# Patient Record
Sex: Female | Born: 1960
Health system: Southern US, Community
[De-identification: ages and names within clinical notes are randomized; demographics above are authoritative.]

## PROBLEM LIST (undated history)

## (undated) DIAGNOSIS — I509 Heart failure, unspecified: Secondary | ICD-10-CM

## (undated) DIAGNOSIS — I10 Essential (primary) hypertension: Secondary | ICD-10-CM

## (undated) DIAGNOSIS — B182 Chronic viral hepatitis C: Secondary | ICD-10-CM

## (undated) DIAGNOSIS — A599 Trichomoniasis, unspecified: Secondary | ICD-10-CM

## (undated) DIAGNOSIS — A4902 Methicillin resistant Staphylococcus aureus infection, unspecified site: Secondary | ICD-10-CM

## (undated) DIAGNOSIS — N760 Acute vaginitis: Secondary | ICD-10-CM

## (undated) DIAGNOSIS — B009 Herpesviral infection, unspecified: Secondary | ICD-10-CM

## (undated) DIAGNOSIS — C169 Malignant neoplasm of stomach, unspecified: Secondary | ICD-10-CM

## (undated) DIAGNOSIS — D649 Anemia, unspecified: Secondary | ICD-10-CM

## (undated) DIAGNOSIS — D219 Benign neoplasm of connective and other soft tissue, unspecified: Secondary | ICD-10-CM

## (undated) HISTORY — DX: Acute vaginitis: N76.0

## (undated) HISTORY — DX: Malignant neoplasm of stomach, unspecified: C16.9

## (undated) HISTORY — DX: Herpesviral infection, unspecified: B00.9

## (undated) HISTORY — DX: Chronic viral hepatitis C: B18.2

## (undated) HISTORY — DX: Heart failure, unspecified: I50.9

---

## 2000-02-10 ENCOUNTER — Emergency Department (HOSPITAL_COMMUNITY): Admission: EM | Admit: 2000-02-10 | Discharge: 2000-02-10 | Payer: Self-pay | Admitting: Emergency Medicine

## 2000-03-19 ENCOUNTER — Emergency Department (HOSPITAL_COMMUNITY): Admission: EM | Admit: 2000-03-19 | Discharge: 2000-03-19 | Payer: Self-pay | Admitting: Emergency Medicine

## 2000-03-20 ENCOUNTER — Emergency Department (HOSPITAL_COMMUNITY): Admission: EM | Admit: 2000-03-20 | Discharge: 2000-03-20 | Payer: Self-pay | Admitting: Emergency Medicine

## 2000-03-24 ENCOUNTER — Emergency Department (HOSPITAL_COMMUNITY): Admission: EM | Admit: 2000-03-24 | Discharge: 2000-03-24 | Payer: Self-pay | Admitting: Emergency Medicine

## 2000-03-24 ENCOUNTER — Encounter: Payer: Self-pay | Admitting: Emergency Medicine

## 2001-02-01 ENCOUNTER — Emergency Department (HOSPITAL_COMMUNITY): Admission: EM | Admit: 2001-02-01 | Discharge: 2001-02-01 | Payer: Self-pay | Admitting: Emergency Medicine

## 2002-01-06 ENCOUNTER — Emergency Department (HOSPITAL_COMMUNITY): Admission: EM | Admit: 2002-01-06 | Discharge: 2002-01-07 | Payer: Self-pay | Admitting: Emergency Medicine

## 2002-01-07 ENCOUNTER — Encounter: Payer: Self-pay | Admitting: Emergency Medicine

## 2002-09-19 ENCOUNTER — Emergency Department (HOSPITAL_COMMUNITY): Admission: EM | Admit: 2002-09-19 | Discharge: 2002-09-19 | Payer: Self-pay | Admitting: Emergency Medicine

## 2005-01-06 ENCOUNTER — Emergency Department (HOSPITAL_COMMUNITY): Admission: EM | Admit: 2005-01-06 | Discharge: 2005-01-06 | Payer: Self-pay | Admitting: Emergency Medicine

## 2005-01-07 ENCOUNTER — Ambulatory Visit: Payer: Self-pay | Admitting: Internal Medicine

## 2005-01-07 ENCOUNTER — Inpatient Hospital Stay (HOSPITAL_COMMUNITY): Admission: AD | Admit: 2005-01-07 | Discharge: 2005-01-08 | Payer: Self-pay | Admitting: Internal Medicine

## 2005-01-07 ENCOUNTER — Ambulatory Visit: Payer: Self-pay | Admitting: *Deleted

## 2005-01-15 ENCOUNTER — Ambulatory Visit: Payer: Self-pay | Admitting: Family Medicine

## 2005-01-22 ENCOUNTER — Ambulatory Visit: Payer: Self-pay | Admitting: Family Medicine

## 2005-01-28 ENCOUNTER — Ambulatory Visit: Payer: Self-pay | Admitting: Family Medicine

## 2005-02-11 ENCOUNTER — Ambulatory Visit: Payer: Self-pay | Admitting: Family Medicine

## 2005-02-18 ENCOUNTER — Ambulatory Visit (HOSPITAL_COMMUNITY): Admission: RE | Admit: 2005-02-18 | Discharge: 2005-02-18 | Payer: Self-pay | Admitting: Family Medicine

## 2005-02-26 ENCOUNTER — Ambulatory Visit: Payer: Self-pay | Admitting: Family Medicine

## 2005-03-10 ENCOUNTER — Ambulatory Visit: Payer: Self-pay | Admitting: Family Medicine

## 2005-06-28 ENCOUNTER — Emergency Department (HOSPITAL_COMMUNITY): Admission: EM | Admit: 2005-06-28 | Discharge: 2005-06-29 | Payer: Self-pay | Admitting: Emergency Medicine

## 2005-06-29 ENCOUNTER — Emergency Department (HOSPITAL_COMMUNITY): Admission: EM | Admit: 2005-06-29 | Discharge: 2005-06-30 | Payer: Self-pay | Admitting: Emergency Medicine

## 2005-07-07 ENCOUNTER — Ambulatory Visit: Payer: Self-pay | Admitting: Family Medicine

## 2005-07-26 ENCOUNTER — Ambulatory Visit: Payer: Self-pay | Admitting: Family Medicine

## 2005-08-11 ENCOUNTER — Ambulatory Visit: Payer: Self-pay | Admitting: Family Medicine

## 2005-09-27 ENCOUNTER — Ambulatory Visit: Payer: Self-pay | Admitting: Family Medicine

## 2005-09-29 ENCOUNTER — Ambulatory Visit: Payer: Self-pay | Admitting: Family Medicine

## 2006-01-07 ENCOUNTER — Ambulatory Visit: Payer: Self-pay | Admitting: Internal Medicine

## 2006-01-14 ENCOUNTER — Ambulatory Visit: Payer: Self-pay | Admitting: Internal Medicine

## 2006-02-24 ENCOUNTER — Emergency Department (HOSPITAL_COMMUNITY): Admission: EM | Admit: 2006-02-24 | Discharge: 2006-02-24 | Payer: Self-pay | Admitting: Emergency Medicine

## 2006-03-06 ENCOUNTER — Emergency Department (HOSPITAL_COMMUNITY): Admission: EM | Admit: 2006-03-06 | Discharge: 2006-03-07 | Payer: Self-pay | Admitting: Emergency Medicine

## 2006-03-21 ENCOUNTER — Emergency Department (HOSPITAL_COMMUNITY): Admission: EM | Admit: 2006-03-21 | Discharge: 2006-03-22 | Payer: Self-pay | Admitting: Emergency Medicine

## 2006-03-27 ENCOUNTER — Emergency Department (HOSPITAL_COMMUNITY): Admission: EM | Admit: 2006-03-27 | Discharge: 2006-03-27 | Payer: Self-pay | Admitting: Emergency Medicine

## 2006-04-29 ENCOUNTER — Encounter (INDEPENDENT_AMBULATORY_CARE_PROVIDER_SITE_OTHER): Payer: Self-pay | Admitting: Family Medicine

## 2006-04-29 LAB — CONVERTED CEMR LAB
BUN: 11 mg/dL
Cholesterol: 186 mg/dL
HDL: 80 mg/dL
Triglycerides: 136 mg/dL

## 2006-05-04 ENCOUNTER — Ambulatory Visit: Payer: Self-pay | Admitting: Family Medicine

## 2006-05-07 ENCOUNTER — Emergency Department (HOSPITAL_COMMUNITY): Admission: EM | Admit: 2006-05-07 | Discharge: 2006-05-07 | Payer: Self-pay | Admitting: Emergency Medicine

## 2006-05-10 ENCOUNTER — Ambulatory Visit: Payer: Self-pay | Admitting: Family Medicine

## 2006-05-27 ENCOUNTER — Ambulatory Visit: Payer: Self-pay | Admitting: Family Medicine

## 2006-06-15 ENCOUNTER — Ambulatory Visit (HOSPITAL_COMMUNITY): Admission: RE | Admit: 2006-06-15 | Discharge: 2006-06-15 | Payer: Self-pay | Admitting: Family Medicine

## 2006-06-30 ENCOUNTER — Ambulatory Visit: Payer: Self-pay | Admitting: Family Medicine

## 2006-09-29 ENCOUNTER — Ambulatory Visit: Payer: Self-pay | Admitting: Family Medicine

## 2006-10-31 ENCOUNTER — Ambulatory Visit: Payer: Self-pay | Admitting: Family Medicine

## 2006-11-08 ENCOUNTER — Ambulatory Visit: Payer: Self-pay | Admitting: Family Medicine

## 2007-01-25 ENCOUNTER — Ambulatory Visit: Payer: Self-pay | Admitting: Family Medicine

## 2007-02-07 ENCOUNTER — Ambulatory Visit: Payer: Self-pay | Admitting: Family Medicine

## 2007-02-07 LAB — CONVERTED CEMR LAB: Hgb A1c MFr Bld: >14 %

## 2007-02-11 ENCOUNTER — Emergency Department (HOSPITAL_COMMUNITY): Admission: EM | Admit: 2007-02-11 | Discharge: 2007-02-11 | Payer: Self-pay | Admitting: Emergency Medicine

## 2007-02-13 ENCOUNTER — Emergency Department (HOSPITAL_COMMUNITY): Admission: EM | Admit: 2007-02-13 | Discharge: 2007-02-13 | Payer: Self-pay | Admitting: Emergency Medicine

## 2007-02-15 ENCOUNTER — Ambulatory Visit: Payer: Self-pay | Admitting: Family Medicine

## 2007-02-22 ENCOUNTER — Encounter (INDEPENDENT_AMBULATORY_CARE_PROVIDER_SITE_OTHER): Payer: Self-pay | Admitting: Family Medicine

## 2007-02-22 DIAGNOSIS — F1910 Other psychoactive substance abuse, uncomplicated: Secondary | ICD-10-CM | POA: Insufficient documentation

## 2007-02-22 DIAGNOSIS — I10 Essential (primary) hypertension: Secondary | ICD-10-CM

## 2007-02-22 DIAGNOSIS — I119 Hypertensive heart disease without heart failure: Secondary | ICD-10-CM | POA: Insufficient documentation

## 2007-02-25 ENCOUNTER — Emergency Department (HOSPITAL_COMMUNITY): Admission: EM | Admit: 2007-02-25 | Discharge: 2007-02-25 | Payer: Self-pay | Admitting: Emergency Medicine

## 2007-04-04 DIAGNOSIS — J069 Acute upper respiratory infection, unspecified: Secondary | ICD-10-CM | POA: Insufficient documentation

## 2007-04-04 LAB — CONVERTED CEMR LAB
Blood Glucose, Fingerstick: 328
Hgb A1c MFr Bld: 12.8 %

## 2007-04-05 ENCOUNTER — Encounter (INDEPENDENT_AMBULATORY_CARE_PROVIDER_SITE_OTHER): Payer: Self-pay | Admitting: *Deleted

## 2007-05-08 ENCOUNTER — Ambulatory Visit: Payer: Self-pay | Admitting: Family Medicine

## 2007-05-10 ENCOUNTER — Ambulatory Visit: Payer: Self-pay | Admitting: Internal Medicine

## 2007-06-13 ENCOUNTER — Encounter (INDEPENDENT_AMBULATORY_CARE_PROVIDER_SITE_OTHER): Payer: Self-pay | Admitting: Family Medicine

## 2007-06-13 ENCOUNTER — Encounter (INDEPENDENT_AMBULATORY_CARE_PROVIDER_SITE_OTHER): Payer: Self-pay | Admitting: Nurse Practitioner

## 2007-06-13 ENCOUNTER — Ambulatory Visit: Payer: Self-pay | Admitting: Family Medicine

## 2007-06-13 DIAGNOSIS — A5901 Trichomonal vulvovaginitis: Secondary | ICD-10-CM | POA: Insufficient documentation

## 2007-06-13 LAB — CONVERTED CEMR LAB
Blood Glucose, Fingerstick: 508
Blood in Urine, dipstick: NEGATIVE
Nitrite: NEGATIVE
Protein, U semiquant: NEGATIVE
Urobilinogen, UA: NEGATIVE
WBC Urine, dipstick: NEGATIVE
pH: 6

## 2007-06-19 ENCOUNTER — Telehealth (INDEPENDENT_AMBULATORY_CARE_PROVIDER_SITE_OTHER): Payer: Self-pay | Admitting: *Deleted

## 2007-06-20 ENCOUNTER — Ambulatory Visit: Payer: Self-pay | Admitting: Family Medicine

## 2007-06-20 DIAGNOSIS — B3731 Acute candidiasis of vulva and vagina: Secondary | ICD-10-CM | POA: Insufficient documentation

## 2007-06-20 DIAGNOSIS — B373 Candidiasis of vulva and vagina: Secondary | ICD-10-CM | POA: Insufficient documentation

## 2007-06-20 LAB — CONVERTED CEMR LAB: Rapid HIV Screen: NEGATIVE

## 2007-06-21 ENCOUNTER — Telehealth (INDEPENDENT_AMBULATORY_CARE_PROVIDER_SITE_OTHER): Payer: Self-pay | Admitting: Family Medicine

## 2007-06-21 LAB — CONVERTED CEMR LAB
ALT: 18 units/L (ref 0–35)
Albumin: 4 g/dL (ref 3.5–5.2)
Amphetamine Screen, Ur: NEGATIVE
BUN: 10 mg/dL (ref 6–23)
Basophils Absolute: 0 10*3/uL (ref 0.0–0.1)
Basophils Relative: 0 % (ref 0–1)
Benzodiazepines.: NEGATIVE
Calcium: 8.8 mg/dL (ref 8.4–10.5)
Chloride: 103 meq/L (ref 96–112)
Cholesterol: 145 mg/dL (ref 0–200)
Creatinine, Ser: 0.54 mg/dL (ref 0.40–1.20)
Eosinophils Absolute: 0 10*3/uL — ABNORMAL LOW (ref 0.2–0.7)
Hemoglobin: 10.5 g/dL — ABNORMAL LOW (ref 12.0–15.0)
MCV: 86.1 fL (ref 78.0–100.0)
Marijuana Metabolite: NEGATIVE
Monocytes Absolute: 0.4 10*3/uL (ref 0.1–1.0)
Neutro Abs: 1.4 10*3/uL — ABNORMAL LOW (ref 1.7–7.7)
Neutrophils Relative %: 40 % — ABNORMAL LOW (ref 43–77)
Opiate Screen, Urine: NEGATIVE
Potassium: 4.2 meq/L (ref 3.5–5.3)
RBC / HPF: NONE SEEN (ref <3)
RDW: 13.4 % (ref 11.5–15.5)
Sodium: 135 meq/L (ref 135–145)
Total CHOL/HDL Ratio: 2

## 2007-06-29 ENCOUNTER — Encounter (INDEPENDENT_AMBULATORY_CARE_PROVIDER_SITE_OTHER): Payer: Self-pay | Admitting: *Deleted

## 2007-07-07 ENCOUNTER — Emergency Department (HOSPITAL_COMMUNITY): Admission: EM | Admit: 2007-07-07 | Discharge: 2007-07-07 | Payer: Self-pay | Admitting: Emergency Medicine

## 2007-07-17 ENCOUNTER — Telehealth (INDEPENDENT_AMBULATORY_CARE_PROVIDER_SITE_OTHER): Payer: Self-pay | Admitting: Family Medicine

## 2007-07-21 ENCOUNTER — Ambulatory Visit: Payer: Self-pay | Admitting: Internal Medicine

## 2007-07-21 LAB — CONVERTED CEMR LAB
Blood Glucose, Fingerstick: 353
Hgb A1c MFr Bld: 12.4 %

## 2007-08-15 ENCOUNTER — Ambulatory Visit: Payer: Self-pay | Admitting: Family Medicine

## 2007-08-15 DIAGNOSIS — N760 Acute vaginitis: Secondary | ICD-10-CM | POA: Insufficient documentation

## 2007-08-15 HISTORY — DX: Acute vaginitis: N76.0

## 2007-08-15 LAB — CONVERTED CEMR LAB: Blood Glucose, Fingerstick: 312

## 2007-09-25 ENCOUNTER — Ambulatory Visit (HOSPITAL_COMMUNITY): Admission: RE | Admit: 2007-09-25 | Discharge: 2007-09-25 | Payer: Self-pay | Admitting: Internal Medicine

## 2007-09-28 ENCOUNTER — Telehealth (INDEPENDENT_AMBULATORY_CARE_PROVIDER_SITE_OTHER): Payer: Self-pay | Admitting: Family Medicine

## 2007-10-16 ENCOUNTER — Ambulatory Visit: Payer: Self-pay | Admitting: Family Medicine

## 2007-10-16 LAB — CONVERTED CEMR LAB
Blood Glucose, Fingerstick: 278
Hgb A1c MFr Bld: 9.7 %

## 2007-12-15 ENCOUNTER — Telehealth (INDEPENDENT_AMBULATORY_CARE_PROVIDER_SITE_OTHER): Payer: Self-pay | Admitting: Family Medicine

## 2008-01-31 ENCOUNTER — Emergency Department (HOSPITAL_COMMUNITY): Admission: EM | Admit: 2008-01-31 | Discharge: 2008-01-31 | Payer: Self-pay | Admitting: Emergency Medicine

## 2008-02-02 ENCOUNTER — Emergency Department (HOSPITAL_COMMUNITY): Admission: EM | Admit: 2008-02-02 | Discharge: 2008-02-02 | Payer: Self-pay | Admitting: Family Medicine

## 2008-06-10 ENCOUNTER — Telehealth (INDEPENDENT_AMBULATORY_CARE_PROVIDER_SITE_OTHER): Payer: Self-pay | Admitting: Family Medicine

## 2008-07-16 ENCOUNTER — Encounter (INDEPENDENT_AMBULATORY_CARE_PROVIDER_SITE_OTHER): Payer: Self-pay | Admitting: *Deleted

## 2008-07-31 ENCOUNTER — Telehealth (INDEPENDENT_AMBULATORY_CARE_PROVIDER_SITE_OTHER): Payer: Self-pay | Admitting: Nurse Practitioner

## 2008-08-14 ENCOUNTER — Ambulatory Visit: Payer: Self-pay | Admitting: Family Medicine

## 2008-08-14 LAB — CONVERTED CEMR LAB: Hgb A1c MFr Bld: 13.8 %

## 2008-12-06 ENCOUNTER — Emergency Department (HOSPITAL_COMMUNITY): Admission: EM | Admit: 2008-12-06 | Discharge: 2008-12-06 | Payer: Self-pay | Admitting: Emergency Medicine

## 2008-12-16 ENCOUNTER — Emergency Department (HOSPITAL_COMMUNITY): Admission: EM | Admit: 2008-12-16 | Discharge: 2008-12-17 | Payer: Self-pay | Admitting: Emergency Medicine

## 2009-02-11 ENCOUNTER — Emergency Department (HOSPITAL_COMMUNITY): Admission: EM | Admit: 2009-02-11 | Discharge: 2009-02-11 | Payer: Self-pay | Admitting: Emergency Medicine

## 2009-03-17 ENCOUNTER — Ambulatory Visit: Payer: Self-pay | Admitting: Family Medicine

## 2009-04-03 ENCOUNTER — Telehealth (INDEPENDENT_AMBULATORY_CARE_PROVIDER_SITE_OTHER): Payer: Self-pay | Admitting: Internal Medicine

## 2009-05-07 ENCOUNTER — Ambulatory Visit: Payer: Self-pay | Admitting: Physician Assistant

## 2009-05-07 DIAGNOSIS — R05 Cough: Secondary | ICD-10-CM

## 2009-05-07 DIAGNOSIS — L989 Disorder of the skin and subcutaneous tissue, unspecified: Secondary | ICD-10-CM | POA: Insufficient documentation

## 2009-05-07 DIAGNOSIS — R059 Cough, unspecified: Secondary | ICD-10-CM | POA: Insufficient documentation

## 2009-05-07 DIAGNOSIS — B353 Tinea pedis: Secondary | ICD-10-CM | POA: Insufficient documentation

## 2009-05-07 LAB — CONVERTED CEMR LAB
CO2: 21 meq/L (ref 19–32)
Calcium: 9.4 mg/dL (ref 8.4–10.5)
Glucose, Urine, Semiquant: NEGATIVE
Hgb A1c MFr Bld: 9.9 %
Ketones, urine, test strip: NEGATIVE
Nitrite: NEGATIVE
Potassium: 4.5 meq/L (ref 3.5–5.3)
Rapid HIV Screen: NEGATIVE
Sodium: 137 meq/L (ref 135–145)
pH: 6

## 2009-05-08 ENCOUNTER — Encounter: Payer: Self-pay | Admitting: Physician Assistant

## 2009-05-13 ENCOUNTER — Ambulatory Visit: Payer: Self-pay | Admitting: Physician Assistant

## 2009-06-18 ENCOUNTER — Ambulatory Visit: Payer: Self-pay | Admitting: Physician Assistant

## 2009-06-18 DIAGNOSIS — B009 Herpesviral infection, unspecified: Secondary | ICD-10-CM | POA: Insufficient documentation

## 2009-06-18 DIAGNOSIS — D179 Benign lipomatous neoplasm, unspecified: Secondary | ICD-10-CM | POA: Insufficient documentation

## 2009-06-18 DIAGNOSIS — H6980 Other specified disorders of Eustachian tube, unspecified ear: Secondary | ICD-10-CM | POA: Insufficient documentation

## 2009-06-18 HISTORY — DX: Herpesviral infection, unspecified: B00.9

## 2009-06-19 ENCOUNTER — Encounter: Payer: Self-pay | Admitting: Physician Assistant

## 2009-06-20 ENCOUNTER — Ambulatory Visit (HOSPITAL_COMMUNITY): Admission: RE | Admit: 2009-06-20 | Discharge: 2009-06-20 | Payer: Self-pay | Admitting: Internal Medicine

## 2009-06-26 ENCOUNTER — Ambulatory Visit (HOSPITAL_COMMUNITY): Admission: RE | Admit: 2009-06-26 | Discharge: 2009-06-26 | Payer: Self-pay | Admitting: Internal Medicine

## 2009-06-27 ENCOUNTER — Emergency Department (HOSPITAL_COMMUNITY): Admission: EM | Admit: 2009-06-27 | Discharge: 2009-06-27 | Payer: Self-pay | Admitting: Family Medicine

## 2009-06-29 ENCOUNTER — Encounter: Payer: Self-pay | Admitting: Physician Assistant

## 2009-07-01 ENCOUNTER — Encounter: Payer: Self-pay | Admitting: Physician Assistant

## 2009-07-02 ENCOUNTER — Encounter (INDEPENDENT_AMBULATORY_CARE_PROVIDER_SITE_OTHER): Payer: Self-pay | Admitting: *Deleted

## 2009-07-03 ENCOUNTER — Encounter: Payer: Self-pay | Admitting: Physician Assistant

## 2009-08-14 ENCOUNTER — Emergency Department (HOSPITAL_COMMUNITY): Admission: EM | Admit: 2009-08-14 | Discharge: 2009-08-14 | Payer: Self-pay | Admitting: Family Medicine

## 2009-08-14 ENCOUNTER — Ambulatory Visit: Payer: Self-pay | Admitting: Obstetrics and Gynecology

## 2009-08-14 ENCOUNTER — Other Ambulatory Visit: Admission: RE | Admit: 2009-08-14 | Discharge: 2009-08-14 | Payer: Self-pay | Admitting: Obstetrics and Gynecology

## 2009-08-14 LAB — CONVERTED CEMR LAB
HCT: 35.4 % — ABNORMAL LOW (ref 36.0–46.0)
Platelets: 247 10*3/uL (ref 150–400)
WBC: 2.7 10*3/uL — ABNORMAL LOW (ref 4.0–10.5)

## 2009-08-20 ENCOUNTER — Ambulatory Visit (HOSPITAL_COMMUNITY): Admission: RE | Admit: 2009-08-20 | Discharge: 2009-08-20 | Payer: Self-pay | Admitting: Family Medicine

## 2009-08-24 ENCOUNTER — Inpatient Hospital Stay (HOSPITAL_COMMUNITY): Admission: EM | Admit: 2009-08-24 | Discharge: 2009-08-26 | Payer: Self-pay | Admitting: Emergency Medicine

## 2009-08-25 ENCOUNTER — Encounter (INDEPENDENT_AMBULATORY_CARE_PROVIDER_SITE_OTHER): Payer: Self-pay | Admitting: Internal Medicine

## 2009-09-04 ENCOUNTER — Ambulatory Visit: Payer: Self-pay | Admitting: Obstetrics and Gynecology

## 2009-09-11 ENCOUNTER — Emergency Department (HOSPITAL_COMMUNITY): Admission: EM | Admit: 2009-09-11 | Discharge: 2009-09-11 | Payer: Self-pay | Admitting: Family Medicine

## 2009-09-19 ENCOUNTER — Ambulatory Visit: Payer: Self-pay | Admitting: Physician Assistant

## 2009-09-19 DIAGNOSIS — D72819 Decreased white blood cell count, unspecified: Secondary | ICD-10-CM | POA: Insufficient documentation

## 2009-09-19 DIAGNOSIS — R079 Chest pain, unspecified: Secondary | ICD-10-CM | POA: Insufficient documentation

## 2009-09-19 LAB — CONVERTED CEMR LAB: Hgb A1c MFr Bld: >14 %

## 2009-09-22 LAB — CONVERTED CEMR LAB
CO2: 24 meq/L (ref 19–32)
Chloride: 98 meq/L (ref 96–112)
Lymphocytes Relative: 43 % (ref 12–46)
Lymphs Abs: 1.8 10*3/uL (ref 0.7–4.0)
MCV: 85.1 fL (ref 78.0–100.0)
Magnesium: 1.9 mg/dL (ref 1.5–2.5)
Monocytes Relative: 5 % (ref 3–12)
Neutro Abs: 2.1 10*3/uL (ref 1.7–7.7)
Neutrophils Relative %: 51 % (ref 43–77)
Potassium: 4.8 meq/L (ref 3.5–5.3)
RBC: 3.89 M/uL (ref 3.87–5.11)
Sodium: 132 meq/L — ABNORMAL LOW (ref 135–145)
WBC: 4.1 10*3/uL (ref 4.0–10.5)

## 2009-10-03 ENCOUNTER — Ambulatory Visit: Payer: Self-pay | Admitting: Physician Assistant

## 2009-10-03 DIAGNOSIS — D509 Iron deficiency anemia, unspecified: Secondary | ICD-10-CM | POA: Insufficient documentation

## 2009-10-03 LAB — CONVERTED CEMR LAB: Blood Glucose, Fingerstick: 353

## 2009-10-09 ENCOUNTER — Emergency Department (HOSPITAL_COMMUNITY): Admission: EM | Admit: 2009-10-09 | Discharge: 2009-10-09 | Payer: Self-pay | Admitting: Emergency Medicine

## 2009-10-21 ENCOUNTER — Encounter: Payer: Self-pay | Admitting: Physician Assistant

## 2009-10-30 ENCOUNTER — Telehealth: Payer: Self-pay | Admitting: Physician Assistant

## 2009-11-03 ENCOUNTER — Ambulatory Visit: Payer: Self-pay | Admitting: Physician Assistant

## 2009-11-03 LAB — CONVERTED CEMR LAB: Blood Glucose, Fingerstick: 274

## 2009-12-27 ENCOUNTER — Ambulatory Visit: Payer: Self-pay | Admitting: Family Medicine

## 2009-12-27 ENCOUNTER — Ambulatory Visit: Payer: Self-pay | Admitting: Internal Medicine

## 2009-12-27 ENCOUNTER — Inpatient Hospital Stay (HOSPITAL_COMMUNITY): Admission: EM | Admit: 2009-12-27 | Discharge: 2009-12-29 | Payer: Self-pay | Admitting: Emergency Medicine

## 2010-01-08 ENCOUNTER — Ambulatory Visit: Payer: Self-pay | Admitting: Physician Assistant

## 2010-01-09 ENCOUNTER — Ambulatory Visit: Payer: Self-pay | Admitting: Physician Assistant

## 2010-01-09 LAB — CONVERTED CEMR LAB: Blood Glucose, Fingerstick: 455

## 2010-02-19 ENCOUNTER — Ambulatory Visit: Payer: Self-pay | Admitting: Physician Assistant

## 2010-02-19 DIAGNOSIS — N644 Mastodynia: Secondary | ICD-10-CM | POA: Insufficient documentation

## 2010-02-19 LAB — CONVERTED CEMR LAB: Blood Glucose, Fingerstick: 334

## 2010-02-20 ENCOUNTER — Encounter: Payer: Self-pay | Admitting: Physician Assistant

## 2010-03-30 ENCOUNTER — Ambulatory Visit: Payer: Self-pay | Admitting: Physician Assistant

## 2010-03-30 LAB — CONVERTED CEMR LAB
Blood Glucose, Fingerstick: 242
KOH Prep: NEGATIVE

## 2010-03-31 LAB — CONVERTED CEMR LAB
Basophils Absolute: 0 10*3/uL (ref 0.0–0.1)
Eosinophils Relative: 2 % (ref 0–5)
HCT: 37.3 % (ref 36.0–46.0)
Hemoglobin: 12.1 g/dL (ref 12.0–15.0)
Lymphocytes Relative: 53 % — ABNORMAL HIGH (ref 12–46)
Lymphs Abs: 2 10*3/uL (ref 0.7–4.0)
Neutro Abs: 1.4 10*3/uL — ABNORMAL LOW (ref 1.7–7.7)
Platelets: 236 10*3/uL (ref 150–400)
WBC: 3.8 10*3/uL — ABNORMAL LOW (ref 4.0–10.5)

## 2010-04-02 ENCOUNTER — Encounter (INDEPENDENT_AMBULATORY_CARE_PROVIDER_SITE_OTHER): Payer: Self-pay | Admitting: *Deleted

## 2010-04-29 ENCOUNTER — Ambulatory Visit: Payer: Self-pay | Admitting: Physician Assistant

## 2010-04-29 LAB — CONVERTED CEMR LAB: Blood Glucose, Fingerstick: 291

## 2010-05-06 ENCOUNTER — Emergency Department (HOSPITAL_COMMUNITY): Admission: EM | Admit: 2010-05-06 | Discharge: 2010-05-06 | Payer: Self-pay | Admitting: Emergency Medicine

## 2010-05-07 ENCOUNTER — Encounter (INDEPENDENT_AMBULATORY_CARE_PROVIDER_SITE_OTHER): Payer: Self-pay | Admitting: *Deleted

## 2010-06-29 ENCOUNTER — Ambulatory Visit: Payer: Self-pay | Admitting: Nurse Practitioner

## 2010-06-29 DIAGNOSIS — N3000 Acute cystitis without hematuria: Secondary | ICD-10-CM | POA: Insufficient documentation

## 2010-06-29 LAB — CONVERTED CEMR LAB
Protein, U semiquant: NEGATIVE
Urobilinogen, UA: 0.2
WBC Urine, dipstick: NEGATIVE

## 2010-06-30 ENCOUNTER — Encounter (INDEPENDENT_AMBULATORY_CARE_PROVIDER_SITE_OTHER): Payer: Self-pay | Admitting: Nurse Practitioner

## 2010-08-09 ENCOUNTER — Encounter: Payer: Self-pay | Admitting: Family Medicine

## 2010-08-09 ENCOUNTER — Encounter: Payer: Self-pay | Admitting: *Deleted

## 2010-08-09 ENCOUNTER — Encounter: Payer: Self-pay | Admitting: Internal Medicine

## 2010-08-16 LAB — CONVERTED CEMR LAB
AST: 39 units/L — ABNORMAL HIGH (ref 0–37)
Albumin: 4.5 g/dL (ref 3.5–5.2)
BUN: 16 mg/dL (ref 6–23)
Blood in Urine, dipstick: NEGATIVE
Calcium: 9.4 mg/dL (ref 8.4–10.5)
Chloride: 105 meq/L (ref 96–112)
Eosinophils Relative: 2 % (ref 0–5)
GC Probe Amp, Genital: NEGATIVE
Glucose, Bld: 91 mg/dL (ref 70–99)
Glucose, Urine, Semiquant: NEGATIVE
HCT: 33.5 % — ABNORMAL LOW (ref 36.0–46.0)
HDL: 91 mg/dL (ref >39)
Hemoglobin: 11.3 g/dL — ABNORMAL LOW (ref 12.0–15.0)
Hgb A1c MFr Bld: 8.5 % — ABNORMAL HIGH (ref 4.6–6.1)
KOH Prep: NEGATIVE
Ketones, urine, test strip: NEGATIVE
Lymphocytes Relative: 51 % — ABNORMAL HIGH (ref 12–46)
Lymphs Abs: 1.9 10*3/uL (ref 0.7–4.0)
Monocytes Absolute: 0.4 10*3/uL (ref 0.1–1.0)
Nitrite: NEGATIVE
Potassium: 4.3 meq/L (ref 3.5–5.3)
WBC: 3.8 10*3/uL — ABNORMAL LOW (ref 4.0–10.5)
Whiff Test: NEGATIVE
pH: 6

## 2010-08-18 NOTE — Letter (Signed)
Summary: *HSN Results Follow up  [facility]  [address] [place], [place] [address]   Phone: [phone]  Fax: [phone]      04/02/2010   ASHLEYANN FLAHERTY [address] [place], [place]  [address]   Dear  Ms. DIANE Kenton,                            ____S.Drinkard,FNP   ____D. Gore,FNP       ____B. McPherson,MD   ____V. Rankins,MD    ____E. Mulberry,MD    ____N. Hassell Done, FNP  ____D. Jobe Igo, MD    ____K. Tomma Lightning, MD    ____Other     This letter is to inform you that your recent test(s):  _______Pap Smear    ____X___Lab Test     _______X-ray    ____X___ is within acceptable limits  _______ requires a medication change  _______ requires a follow-up lab visit  _______ requires a follow-up visit with your Camisha Srey   Comments: We are trying to contact you about adjustment to your medicines.  Please give the office a call at your earliest convenience.       _________________________________________________________ If you have any questions, please contact our office                     Sincerely,  [place] Shannon [facility]

## 2010-08-18 NOTE — Assessment & Plan Note (Signed)
Summary: 1 MONTH FU ON DIABETES//KT   Vital Signs:  Patient profile:   50 year old female Height:      64.25 inches Weight:      221 pounds BMI:     37.78 Temp:     98.2 degrees F oral Pulse rate:   82 / minute Pulse rhythm:   regular Resp:     18 per minute BP sitting:   152 / 108  (left arm) Cuff size:   regular  Vitals Entered By: CMA Student Phineas Inches CC: f/u  for DM, out of diabetic medications, feeling nausea, frequent urination, patient wants to detemine if she still has the tric. infection,mediations verified Is Patient Diabetic? Yes CBG Result 242  Does patient need assistance? Functional Status Self care Ambulation Normal   Primary Care Provider:  Richardson Dopp PA-C  CC:  f/u  for DM, out of diabetic medications, feeling nausea, frequent urination, patient wants to detemine if she still has the tric. infection, and mediations verified.  History of Present Illness: Here for f/u. Has lost all of her medications again.  She continues to be noncompliant with her diabetes.  She has repeatedly done this in the past.  We have talked about the dangers of not treating her diabetes on multiple occasions.   She only has her Lantus, Novolog and Metformin. She is not checking her sugars. Has not eaten today.  Feels nauseated. + polyuria + polydipsia No dysuria No vaginal burning ? lower abd pain this morning . . ? wanted to know if she still had trich.  Current Medications (verified): 1)  Cozaar 50 Mg Tabs (Losartan Potassium) .... Take 1 Tablet By Mouth Once A Day 2)  Actos 45 Mg Tabs (Pioglitazone Hcl) .... Take 1 Tablet By Mouth Every Morning 3)  Lantus Solostar Pen Needles .... Use With Solostar Pen 4)  Lantus Solostar 100 Unit/ml  Soln (Insulin Glargine) .... Take 70 Units Subcutaneously Each Night 5)  Novolog Flexpen 100 Unit/ml Soln (Insulin Aspart) .... Inject 17 Units Right Before Each Meal ( Three Times A Day ) 6)  Blood Glucose Meter  Kit (Blood Glucose  Monitoring Suppl) .... Test Blood Glucose Daily 7)  Blood Glucose Test  Strp (Glucose Blood) .... Test Blood Glucose Daily 8)  Lancets  Misc (Lancets) .... Test Glucose Daily 9)  Valtrex 500 Mg Tabs (Valacyclovir Hcl) .... Take 1 Tablet By Mouth Two Times A Day X 3 Days For Outbreak 10)  Iron 325 (65 Fe) Mg Tabs (Ferrous Sulfate) .... Take 1 Tablet By Mouth Two Times A Day 11)  Hydrochlorothiazide 25 Mg Tabs (Hydrochlorothiazide) .... Do Not Take For Now 12)  Metformin Hcl 500 Mg Tabs (Metformin Hcl) .... Take 1 Tablet By Mouth Two Times A Day For Diabetes 13)  Nitrostat 0.4 Mg Subl (Nitroglycerin) .... Take One Pill Sublingal Every 5 Min. For Chest Pain.  If You Need To Take More Than 3, You Need To Go To Ed 14)  Pantoprazole Sodium 40 Mg Tbec (Pantoprazole Sodium) .... One Tab By Mouth  Allergies (verified): No Known Drug Allergies  Physical Exam  General:  alert, well-developed, and well-nourished.   Head:  normocephalic and atraumatic.   Neck:  supple.   Lungs:  normal breath sounds.   Heart:  normal rate and regular rhythm.   Abdomen:  soft, non-tender, and no hepatomegaly.   Extremities:  no edema  Neurologic:  alert & oriented X3 and cranial nerves II-XII intact.   Psych:  normally interactive.     Impression & Recommendations:  Problem # 1:  DIABETES MELLITUS, TYPE II, ON INSULIN, UNCONTROLLED (ICD-250.72)  she is not serious about her diabetes I have had this same conversation with her multiple times she always states she is going to try to do better but never does refer to Susie to see if some education will get her headed in the right direction  Her updated medication list for this problem includes:    Cozaar 50 Mg Tabs (Losartan potassium) .Marland Kitchen... Take 1 tablet by mouth once a day    Actos 45 Mg Tabs (Pioglitazone hcl) .Marland Kitchen... Take 1 tablet by mouth every morning    Lantus Solostar 100 Unit/ml Soln (Insulin glargine) .Marland Kitchen... Take 70 units subcutaneously each night     Novolog Flexpen 100 Unit/ml Soln (Insulin aspart) ..... Inject 17 units right before each meal ( three times a day )    Metformin Hcl 500 Mg Tabs (Metformin hcl) .Marland Kitchen... Take 1 tablet by mouth two times a day for diabetes  Orders: Capillary Blood Glucose/CBG [id number])  Problem # 2:  HYPERTENSION (ICD-401.9) out of meds  The following medications were removed from the medication list:    Hydrochlorothiazide 25 Mg Tabs (Hydrochlorothiazide) .Marland Kitchen... Do not take for now Her updated medication list for this problem includes:    Cozaar 50 Mg Tabs (Losartan potassium) .Marland Kitchen... Take 1 tablet by mouth once a day  Problem # 3:  TRICHOMONAL VAGINITIS (ICD-131.01)  recheck wet prep  Orders: [facility] [phone])  Complete Medication List: 1)  Cozaar 50 Mg Tabs (Losartan potassium) .... Take 1 tablet by mouth once a day 2)  Actos 45 Mg Tabs (Pioglitazone hcl) .... Take 1 tablet by mouth every morning 3)  Lantus Solostar Pen Needles  .... Use with solostar pen 4)  Lantus Solostar 100 Unit/ml Soln (Insulin glargine) .... Take 70 units subcutaneously each night 5)  Novolog Flexpen 100 Unit/ml Soln (Insulin aspart) .... Inject 17 units right before each meal ( three times a day ) 6)  Blood Glucose Meter Kit (Blood glucose monitoring suppl) .... Test blood glucose daily 7)  Blood Glucose Test Strp (Glucose blood) .... Test blood glucose daily 8)  Lancets Misc (Lancets) .... Test glucose daily 9)  Valtrex 500 Mg Tabs (Valacyclovir hcl) .... Take 1 tablet by mouth two times a day x 3 days for outbreak 10)  Iron 325 (65 Fe) Mg Tabs (Ferrous sulfate) .... Take 1 tablet by mouth two times a day 11)  Metformin Hcl 500 Mg Tabs (Metformin hcl) .... Take 1 tablet by mouth two times a day for diabetes 12)  Nitrostat 0.4 Mg Subl (Nitroglycerin) .... Take one pill sublingal every 5 min. for chest pain.  if you need to take more than 3, you need to go to ed 13)  Pantoprazole Sodium 40 Mg Tbec (Pantoprazole sodium) ....  One tab by mouth  Other Orders: T-CBC w/Diff [id number])  Patient Instructions: 1)  Please schedule a follow-up appointment in 1 month with Scott for diabetes. 2)  Check sugar three times a day before each meal.  Write down and bring to appt with you. 3)  Schedule appt with Susie for diabetic education. 4)  I need you to take charge of your diabetes.  I can only help you if you help yourself. 5)  I have sent a fax to [facility]. for your Cozaar, Lancets, Test Strips, Actos and Protonix.  Please restart your medicines. 6)  [place], see  if [facility] can fill meds today or tomorrow. Prescriptions: PANTOPRAZOLE SODIUM 40 MG TBEC (PANTOPRAZOLE SODIUM) one tab by mouth  #30 x 2   Entered and Authorized by:   Richardson Dopp PA-C   Signed by:   Richardson Dopp PA-C on 03/30/2010   Method used:   Faxed to ...       [facility] (retail)       [place], [place]  [address]       Ph: [phone] [place]       Fax: [phone]   RxID:   [id number] BLOOD GLUCOSE TEST  STRP (GLUCOSE BLOOD) test blood glucose daily  #100 x 11   Entered and Authorized by:   Richardson Dopp PA-C   Signed by:   Richardson Dopp PA-C on 03/30/2010   Method used:   Faxed to ...       [facility] (retail)       [place], [place]  [address]       Ph: [phone] [place]       Fax: [phone]   RxID:   [id number] LANCETS  MISC (LANCETS) test glucose daily  #100 x 11   Entered and Authorized by:   Richardson Dopp PA-C   Signed by:   Richardson Dopp PA-C on 03/30/2010   Method used:   Faxed to ...       [facility] (retail)       [place], [place]  [address]       Ph: [phone] [place]       Fax: [phone]   RxID:   [id number] ACTOS 45 MG TABS (PIOGLITAZONE HCL) Take 1 tablet by mouth every morning  #30 x 5   Entered and Authorized by:   Richardson Dopp  PA-C   Signed by:   Richardson Dopp PA-C on 03/30/2010   Method used:   Faxed to ...       [facility] (retail)       [place], [place]  [address]       Ph: [phone] [place]       Fax: [phone]   RxID:   [id number] COZAAR 50 MG TABS (LOSARTAN POTASSIUM) Take 1 tablet by mouth once a day  #30 x 5   Entered and Authorized by:   Richardson Dopp PA-C   Signed by:   Richardson Dopp PA-C on 03/30/2010   Method used:   Faxed to ...       [facility] (retail)       [place], [place]  [address]       Ph: [phone] [place]       Fax: [phone]   RxID:   [id number]     Laboratory Results   Blood Tests     CBG Random:: 242mg /dL    Wet [place] Source: vaginal WBC/hpf: 1-5 Bacteria/hpf: rare Clue cells/hpf: none Yeast/hpf: none Wet [place] KOH: Negative Trichomonas/hpf: none

## 2010-08-18 NOTE — Assessment & Plan Note (Signed)
Summary: DM; HTN   Vital Signs:  Patient profile:   50 year old female Height:      64.25 inches Weight:      227 pounds BMI:     38.80 Temp:     97.5 degrees F oral Pulse rate:   86 / minute Pulse rhythm:   regular Resp:     20 per minute BP sitting:   126 / 90  (left arm) Cuff size:   regular  Vitals Entered By: [place] Shannon (April 29, 2010 2:40 PM) CC: one month f/u..... Is Patient Diabetic? Yes Pain Assessment Patient in pain? no      CBG Result 291  Does patient need assistance? Functional Status Self care Ambulation Normal   Primary Care [place]:  Richardson Dopp PA-C  CC:  one month f/u......  History of Present Illness: Here for follow up.  Finally back on HTN meds.  BP much better.    Using Lantus 65 units instead of 70.  "My daughter threw away my novolog."  Has not had for last few days.  Ate just before coming today.  Sugar looks much better than previously.    Feels much better.  No polydipsia or polyuria.  No nausea or stomach pain.  Seems more serious about her diabetes and blood pressure now.   Current Medications (verified): 1)  Cozaar 50 Mg Tabs (Losartan Potassium) .... Take 1 Tablet By Mouth Once A Day 2)  Actos 45 Mg Tabs (Pioglitazone Hcl) .... Take 1 Tablet By Mouth Every Morning 3)  Lantus Solostar Pen Needles .... Use With Solostar Pen 4)  Lantus Solostar 100 Unit/ml  Soln (Insulin Glargine) .... Take 70 Units Subcutaneously Each Night 5)  Novolog Flexpen 100 Unit/ml Soln (Insulin Aspart) .... Inject 17 Units Right Before Each Meal ( Three Times A Day ) 6)  Blood Glucose Meter  Kit (Blood Glucose Monitoring Suppl) .... Test Blood Glucose Daily 7)  Blood Glucose Test  Strp (Glucose Blood) .... Test Blood Glucose Daily 8)  Lancets  Misc (Lancets) .... Test Glucose Daily 9)  Valtrex 500 Mg Tabs (Valacyclovir Hcl) .... Take 1 Tablet By Mouth Two Times A Day X 3 Days For Outbreak 10)  Iron 325 (65 Fe) Mg Tabs (Ferrous Sulfate) .... Take 1  Tablet By Mouth Two Times A Day 11)  Metformin Hcl 500 Mg Tabs (Metformin Hcl) .... Take 1 Tablet By Mouth Two Times A Day For Diabetes 12)  Nitrostat 0.4 Mg Subl (Nitroglycerin) .... Take One Pill Sublingal Every 5 Min. For Chest Pain.  If You Need To Take More Than 3, You Need To Go To Ed 13)  Pantoprazole Sodium 40 Mg Tbec (Pantoprazole Sodium) .... 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   Head:  normocephalic and atraumatic.   Lungs:  normal breath sounds, no crackles, and no wheezes.   Heart:  normal rate and regular rhythm.  2/6 systolic murmur at RUSB . . .echo in 08/2009 normal LVF and no valve abnl. Neurologic:  alert & oriented X3 and cranial nerves II-XII intact.   Psych:  normally interactive.     Impression & Recommendations:  Problem # 1:  DIABETES MELLITUS, TYPE II, ON INSULIN, UNCONTROLLED (ICD-250.72) Assessment Improved refill  novolog sounds like she is doing better  Her updated medication list for this problem includes:    Cozaar 50 Mg Tabs (Losartan potassium) .Marland Kitchen... Take 1 tablet by mouth once a day    Actos 45 Mg Tabs (Pioglitazone hcl) .Marland Kitchen... Take 1 tablet by mouth every morning    Lantus Solostar 100 Unit/ml Soln (Insulin glargine) .Marland Kitchen... Take 65 units subcutaneously each night    Novolog Flexpen 100 Unit/ml Soln (Insulin aspart) ..... Inject 17 units right before each meal ( three times a day )    Metformin Hcl 500 Mg Tabs (Metformin hcl) .Marland Kitchen... Take 1 tablet by mouth two times a day for diabetes  Orders: Capillary Blood Glucose/CBG [id number])  Problem # 2:  HYPERTENSION (ICD-401.9) Assessment: Improved continue current meds consider  adding hctz if diast remains above 80  Her updated medication list for this problem includes:    Cozaar 50 Mg Tabs (Losartan potassium) .Marland Kitchen... 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 Take 1 tablet by mouth once a day 2)  Actos 45 Mg Tabs (Pioglitazone hcl) .... Take 1 tablet by mouth every morning 3)  Lantus Solostar Pen Needles  .... Use with solostar pen 4)  Lantus Solostar 100 Unit/ml Soln (Insulin glargine) .... Take 65 units subcutaneously each night 5)  Novolog Flexpen 100 Unit/ml Soln (Insulin aspart) .... Inject 17 units right before each meal ( three times a day ) 6)  Blood Glucose Meter Kit (Blood glucose monitoring suppl) .... Test blood glucose daily 7)  Blood Glucose Test Strp (Glucose blood) .... Test blood glucose daily 8)  Lancets Misc (Lancets) .... Test glucose daily 9)  Valtrex 500 Mg Tabs (Valacyclovir hcl) .... Take 1 tablet by mouth two times a day x 3 days for outbreak 10)  Iron 325 (65 Fe) Mg Tabs (Ferrous sulfate) .... Take 1 tablet by mouth two times a day 11)  Metformin Hcl 500 Mg Tabs (Metformin hcl) .... Take 1 tablet by mouth two times a day for diabetes 12)  Nitrostat 0.4 Mg Subl (Nitroglycerin) .... Take one pill sublingal every 5 min. for chest pain.  if you need to take more than 3, you need to go to ed 13)  Pantoprazole Sodium 40 Mg Tbec (Pantoprazole sodium) .... One tab by mouth  Patient Instructions: 1)  Take your prescription to [facility] to get the Lancets. 2)  Keep taking your current medicines. 3)  I sent a new prescription for Novolog to the pharmacy at [facility] this filled and restart your novolog as directed. 4)  Schedule a follow up in 2 months for your diabetes. Prescriptions: NOVOLOG FLEXPEN 100 UNIT/ML SOLN (INSULIN ASPART) Inject 17 units right  before each meal ( three times a day )  #1 mos supply x 0   Entered and Authorized by:   Richardson Dopp PA-C   Signed by:   Richardson Dopp PA-C on 04/29/2010   Method used:   Faxed to ...       [facility] (retail)       [place], [place]  [address]       Ph: [phone] [place] [place]       Fax: [phone]   RxID:   [id number]

## 2010-08-18 NOTE — Progress Notes (Signed)
Summary: Diabetes Class - No Show   Phone Note Outgoing Call   Summary of Call: Please find out why she missed her diabetes class at [facility]. Initial call taken by: Richardson Dopp PA-C,  October 30, 2009 5:48 PM  Follow-up for Phone Call        number is disconnected...Marland KitchenMarland KitchenThailand Shannon  October 31, 2009 1:45 PM  pt states that she was not in town..[place] Shannon  November 03, 2009 3:10 PM \\par   Additional Follow-up for Phone Call Additional follow up Details #1::        Patient in office and d/w her today. Additional Follow-up by: Richardson Dopp PA-C,  November 03, 2009 3:59 PM

## 2010-08-18 NOTE — Assessment & Plan Note (Signed)
Summary: 3 month f/u/////cns   Vital Signs:  Patient profile:   50 year old female Height:      64.25 inches Weight:      211 pounds BMI:     36.07 Pulse rate:   89 / minute Pulse rhythm:   regular Resp:     18 per minute BP sitting:   136 / 84  (left arm)  Vitals Entered By: [place] Shannon (September 19, 2009 12:01 PM)  Primary Care Provider:  Richardson Dopp PA-C  CC:  THREE MONTH F/U.....  History of Present Illness: Here for f/u on DM and HTN. She has a sugar that does not read on our machine.  Her A1C is above 14.  She is out of her Lantus.  Says she has not taken in 4 days . . . "let's just say it's been a week." She is not taking novolog with meals. Not sure if she is taking Actos.  Did not bring meds with her.  She says she is taking the medicines I told her to take.  Of note, most meds she is taking were prescribed by Dr. Radene Ou before I met her. She denies nausea, vomiting, tachycardia. She does admit to polydipsia, polyuria and polyphagia. She says her "heart aches."  No exertional pain.  No radiation.  No assoc sob.  No assoc nausea or diaph.  No syncope. She went to the hosp in early Feb.  Her sugar was over 700.  Says she was given a "heart pill."  She was seen for atypical chest pain.  Her enzymes were neg x 3.  Echo was ok.  She was thought to have MSK chest pain 2/2 cough from bronchitis.   Has iron def. anemia.  Seeing GYN clinic for menorrhagia related to fibroids.  Says they are putting her on OCPs.   She has had mild leukopenia.  Needs f/u cbc.  Current Medications (verified): 1)  Cozaar 50 Mg Tabs (Losartan Potassium) .... Take 1 Tablet By Mouth Once A Day 2)  Actos 45 Mg Tabs (Pioglitazone Hcl) .... Take 1 Tablet By Mouth Every Morning 3)  Lantus Solostar Pen Needles .... Use With Solostar Pen 4)  Lantus Solostar 100 Unit/ml  Soln (Insulin Glargine) .... Take 65 Units Subcutaneously Each Night 5)  Humalog Kwikpen 100 Unit/ml  Soln (Insulin Lispro (Human)) ....  Take 15 Units Before Each Meal 6)  Blood Glucose Meter  Kit (Blood Glucose Monitoring Suppl) .... Test Blood Glucose Daily 7)  Blood Glucose Test  Strp (Glucose Blood) .... Test Blood Glucose Daily 8)  Lancets  Misc (Lancets) .... Test Glucose Daily 9)  Tessalon Perles 100 Mg Caps (Benzonatate) .... Take 1 Tablet By Mouth Three Times A Day As Needed For Cough 10)  Lamisil At Athletes Foot 1 % Crea (Terbinafine Hcl) .... Apply Two Times A Day Until Clear 11)  Valtrex 500 Mg Tabs (Valacyclovir Hcl) .... Take 1 Tablet By Mouth Two Times A Day X 3 Days For Outbreak 12)  Flonase 50 Mcg/act Susp (Fluticasone Propionate) .Marland Kitchen.. 1 Spray Each Nostril Once Daily For One Month (Use Every Other Day For The Last Week) 13)  Iron 325 (65 Fe) Mg Tabs (Ferrous Sulfate) .... Take 1 Tablet By Mouth Two Times A Day  Allergies (verified): No Known Drug Allergies  Comments:  Nurse/Medical Assistant: PT SAYS SHE NEEDS REFILL ON LANTUS AND HUMALOG.. [place] Shannon (September 19, 2009 12:10 PM)  Physical Exam  General:  alert, well-developed, and well-nourished.  Head:  normocephalic and atraumatic.   Mouth:  lips dry mucus membranes moist Neck:  supple.   Lungs:  normal breath sounds.   Heart:  normal rate and regular rhythm.   Abdomen:  soft.   Neurologic:  alert & oriented X3 and cranial nerves II-XII intact.   Psych:  normally interactive.     Impression & Recommendations:  Problem # 1:  DIABETES MELLITUS, TYPE II, ON INSULIN, UNCONTROLLED (ICD-250.72)  uncontrolled does not appear to be ketotic no acetone breath will give regular insulin 15 units in office will ask her to get Lantus, Humalog and Actos today consider adding metformin at some point compliance is an issue long d/w patient today regarding possible effects of untreated DM including loss of limb, blindness and ESRD requiring dialysis refer back to dietician  Her updated medication list for this problem includes:    Cozaar 50 Mg Tabs  (Losartan potassium) .Marland Kitchen... Take 1 tablet by mouth once a day    Actos 45 Mg Tabs (Pioglitazone hcl) .Marland Kitchen... Take 1 tablet by mouth every morning    Lantus Solostar 100 Unit/ml Soln (Insulin glargine) .Marland Kitchen... Take 65 units subcutaneously each night    Novolog Flexpen 100 Unit/ml Soln (Insulin aspart) ..... Inject 15 units right before each meal ( three times a day )  Orders: T-Basic Metabolic Panel ([id number]) T-Phosphorus [phone]) T-Magnesium [phone]) T-Urinalysis [id number])  Problem # 2:  LEUKOPENIA, MILD (ICD-288.50)  repeat cbc today ask path to review smear  Orders: T-CBC w/Diff [id number])  Problem # 3:  CHEST PAIN UNSPECIFIED (ICD-786.50)  atypical suspect MSK pain consider referral to card for stress test once sugars tamed  Complete Medication List: 1)  Cozaar 50 Mg Tabs (Losartan potassium) .... Take 1 tablet by mouth once a day 2)  Actos 45 Mg Tabs (Pioglitazone hcl) .... Take 1 tablet by mouth every morning 3)  Lantus Solostar Pen Needles  .... Use with solostar pen 4)  Lantus Solostar 100 Unit/ml Soln (Insulin glargine) .... Take 65 units subcutaneously each night 5)  Novolog Flexpen 100 Unit/ml Soln (Insulin aspart) .... Inject 15 units right before each meal ( three times a day ) 6)  Blood Glucose Meter Kit (Blood glucose monitoring suppl) .... Test blood glucose daily 7)  Blood Glucose Test Strp (Glucose blood) .... Test blood glucose daily 8)  Lancets Misc (Lancets) .... Test glucose daily 9)  Tessalon Perles 100 Mg Caps (Benzonatate) .... Take 1 tablet by mouth three times a day as needed for cough 10)  Lamisil At Athletes Foot 1 % Crea (Terbinafine hcl) .... Apply two times a day until clear 11)  Valtrex 500 Mg Tabs (Valacyclovir hcl) .... Take 1 tablet by mouth two times a day x 3 days for outbreak 12)  Flonase 50 Mcg/act Susp (Fluticasone propionate) .Marland Kitchen.. 1 spray each nostril once daily for one month (use every other day for the last week) 13)   Iron 325 (65 Fe) Mg Tabs (Ferrous sulfate) .... Take 1 tablet by mouth two times a day  Patient Instructions: 1)  Get Lantus and Novolog today. 2)  Start taking today. 3)  Take Lantus 65 units at bedtime. 4)  Take novolog 15 units before each meal ( three times a day ). 5)  Check your sugar before breakfast, lunch and dinner.  Record the numbers and bring to your next appointment. 6)  Follow up with Dana Dorner in 1-2 weeks for diabetes. 7)  Schedule appointment with Mendel Corning for refresher on diabetes management.  Prescriptions: IRON 325 (65 FE) MG TABS (FERROUS SULFATE) Take 1 tablet by mouth two times a day  #60 x 5   Entered and Authorized by:   Richardson Dopp PA-C   Signed by:   Richardson Dopp PA-C on 09/19/2009   Method used:   Print then Give to Patient   RxID:   [id number] ACTOS 45 MG TABS (PIOGLITAZONE HCL) Take 1 tablet by mouth every morning  #30 x 5   Entered and Authorized by:   Richardson Dopp PA-C   Signed by:   Richardson Dopp PA-C on 09/19/2009   Method used:   Print then Give to Patient   RxID:   [id number] LANTUS SOLOSTAR 100 UNIT/ML  SOLN (INSULIN GLARGINE) Take 65 units Subcutaneously each night  #1 month x 11   Entered and Authorized by:   Richardson Dopp PA-C   Signed by:   Richardson Dopp PA-C on 09/19/2009   Method used:   Print then Give to Patient   RxID:   [id number] NOVOLOG FLEXPEN 100 UNIT/ML SOLN (INSULIN ASPART) Inject 15 units right before each meal ( three times a day )  #1 mo supply x 11   Entered and Authorized by:   Richardson Dopp PA-C   Signed by:   Richardson Dopp PA-C on 09/19/2009   Method used:   Print then Give to Patient   RxID:   [id number]   Laboratory Results   Blood Tests   Date/Time Received: September 19, 2009 12:08 PM   HGBA1C: >14%   (Normal Range: Non-Diabetic - 3-6%   Control Diabetic - 6-8%) CBG Random:: HImg/dL  Comments: repeat CBG 440     Appended Document: 3 month f/u/////cns  Laboratory Results   Urine  Tests    Routine Urinalysis   Glucose: negative   (Normal Range: Negative) Bilirubin: negative   (Normal Range: Negative) Ketone: negative   (Normal Range: Negative) Spec. Gravity: <1.005   (Normal Range: 1.003-1.035) Blood: large   (Normal Range: Negative) pH: 5.0   (Normal Range: 5.0-8.0) Protein: 100   (Normal Range: Negative) Urobilinogen: 1.0   (Normal Range: 0-1) Nitrite: negative   (Normal Range: Negative) Leukocyte Esterace: negative   (Normal Range: Negative)    Comments: pt on her cycle.

## 2010-08-18 NOTE — Progress Notes (Signed)
Summary: Office Visit/DEPRESSION SCREENING  Office Visit/DEPRESSION SCREENING   Imported By: Roland Earl 08/08/2009 10:48:38  _____________________________________________________________________  External Attachment:    Type:   Image     Comment:   External Document

## 2010-08-18 NOTE — Letter (Signed)
Summary: *[facility] Results Follow up  [place], [place] [address]   Phone: [phone]  Fax: [phone]      02/20/2010   REYNAH OESTREICH [address] [place], [place]  [address]   Dear  Ms. DIANE Bosso,                            ____S.Drinkard,FNP   ____D. Gore,FNP       ____B. McPherson,MD   ____V. Rankins,MD    ____E. Mulberry,MD    ____N. Hassell Done, FNP  ____D. Jobe Igo, MD    ____K. Tomma Lightning, MD    __x__S. Kathlen Mody, PA-C     This letter is to inform you that your recent test(s):  _______Pap Smear    _______Lab Test     _______X-ray    _______ is within acceptable limits  _______ requires a medication change  _______ requires a follow-up lab visit  _______ requires a follow-up visit with your Malike Foglio   Comments: Other test for infection sent off to the lab was negative.       _________________________________________________________ If you have any questions, please contact our office                     Sincerely,  Richardson Dopp PA-C [facility]

## 2010-08-18 NOTE — Letter (Signed)
Summary: GYNECOLOGIC CYTOLOGY REPORT  GYNECOLOGIC CYTOLOGY REPORT   Imported By: Roland Earl 09/01/2009 15:12:43  _____________________________________________________________________  External Attachment:    Type:   Image     Comment:   External Document

## 2010-08-18 NOTE — Letter (Signed)
Summary: [facility]ERRAL//[facility]  REFERRAL[facility]   Imported By: Roland Earl 08/11/2009 08:50:13  _____________________________________________________________________  External Attachment:    Type:   Image     Comment:   External Document

## 2010-08-18 NOTE — Assessment & Plan Note (Signed)
Summary: 1 month fu on dm////kt   Vital Signs:  Patient profile:   50 year old female Height:      64.25 inches Weight:      217 pounds BMI:     37.09 Temp:     98.4 degrees F oral Pulse rate:   102 / minute Pulse rhythm:   regular Resp:     18 per minute BP sitting:   121 / 84  (left arm) Cuff size:   large  Vitals Entered By: [place] Shannon (November 03, 2009 3:02 PM) CC: one month f/u.... pt says she can not sleep at night... pt says she wakes up hot and then she willl get cold...., Hypertension Management Is Patient Diabetic? Yes Pain Assessment Patient in pain? no      CBG Result 274  Does patient need assistance? Functional Status Self care Ambulation Normal   Primary Care Provider:  Richardson Dopp PA-C  CC:  one month f/u.... pt says she can not sleep at night... pt says she wakes up hot and then she willl get cold.... and Hypertension Management.  History of Present Illness: Here for f/u.  DM:  Did not go to dietician.  Uncle died and she has been out of sorts.  She did not bring in list of sugars today.  Her am sugars are usually good (< 200).  But, during the day, it goes up and down.  Her sugar today is better than it has been.  She denies polyuria or polydipsia.  HTN:  Back on Hyzaar.  BP looks better.    Hot flashes:  Has noted these recently.  Has irregular periods.  Not sexually active.  Worried about going through menopause.  Disrupting her sleep.    Hypertension History:      She denies chest pain, dyspnea with exertion, and syncope.        Positive major cardiovascular risk factors include diabetes and hypertension.  Negative major cardiovascular risk factors include female age less than 23 years old, negative family history for ischemic heart disease, and non-tobacco-user status.     Current Medications (verified): 1)  Cozaar 50 Mg Tabs (Losartan Potassium) .... Take 1 Tablet By Mouth Once A Day 2)  Actos 45 Mg Tabs (Pioglitazone Hcl) .... Take 1 Tablet  By Mouth Every Morning 3)  Lantus Solostar Pen Needles .... Use With Solostar Pen 4)  Lantus Solostar 100 Unit/ml  Soln (Insulin Glargine) .... Take 70 Units Subcutaneously Each Night 5)  Novolog Flexpen 100 Unit/ml Soln (Insulin Aspart) .... Inject 17 Units Right Before Each Meal ( Three Times A Day ) 6)  Blood Glucose Meter  Kit (Blood Glucose Monitoring Suppl) .... Test Blood Glucose Daily 7)  Blood Glucose Test  Strp (Glucose Blood) .... Test Blood Glucose Daily 8)  Lancets  Misc (Lancets) .... Test Glucose Daily 9)  Tessalon Perles 100 Mg Caps (Benzonatate) .... Take 1 Tablet By Mouth Three Times A Day As Needed For Cough 10)  Lamisil At Athletes Foot 1 % Crea (Terbinafine Hcl) .... Apply Two Times A Day Until Clear 11)  Valtrex 500 Mg Tabs (Valacyclovir Hcl) .... Take 1 Tablet By Mouth Two Times A Day X 3 Days For Outbreak 12)  Flonase 50 Mcg/act Susp (Fluticasone Propionate) .Marland Kitchen.. 1 Spray Each Nostril Once Daily For One Month (Use Every Other Day For The Last Week) 13)  Iron 325 (65 Fe) Mg Tabs (Ferrous Sulfate) .... Take 1 Tablet By Mouth Two Times A Day 14)  Hydrochlorothiazide 25 Mg Tabs (Hydrochlorothiazide) .... One Tab By Mouth Every Day in The Morning 15)  Metformin Hcl 500 Mg Tabs (Metformin Hcl) .... Take 1 Tablet By Mouth Two Times A Day For Diabetes  Allergies (verified): No Known Drug Allergies  Physical Exam  General:  alert, well-developed, and well-nourished.   Head:  normocephalic and atraumatic.   Neck:  supple.   Lungs:  normal breath sounds.   Heart:  normal rate and regular rhythm.   Extremities:  no edema Neurologic:  alert & oriented X3 and cranial nerves II-XII intact.   Skin:  turgor normal.   Psych:  normally interactive.    Diabetes Management Exam:    Foot Exam (with socks and/or shoes not present):       Sensory-Monofilament:          Left foot: normal          Right foot: normal   Impression & Recommendations:  Problem # 1:  DIABETES MELLITUS,  TYPE II, ON INSULIN, UNCONTROLLED (ICD-250.72) go ahead and increase Lantus to 70 units like I previously asked her also, change Novolog to 18 units three times a day with meals she is now on metformin as well f/u with me in 6 weeks and recheck A1C then  Her updated medication list for this problem includes:    Cozaar 50 Mg Tabs (Losartan potassium) .Marland Kitchen... Take 1 tablet by mouth once a day    Actos 45 Mg Tabs (Pioglitazone hcl) .Marland Kitchen... Take 1 tablet by mouth every morning    Lantus Solostar 100 Unit/ml Soln (Insulin glargine) .Marland Kitchen... Take 70 units subcutaneously each night    Novolog Flexpen 100 Unit/ml Soln (Insulin aspart) ..... Inject 18 units right before each meal ( three times a day )    Metformin Hcl 500 Mg Tabs (Metformin hcl) .Marland Kitchen... Take 1 tablet by mouth two times a day for diabetes  Problem # 2:  HYPERTENSION (ICD-401.9) much better controlled  Her updated medication list for this problem includes:    Cozaar 50 Mg Tabs (Losartan potassium) .Marland Kitchen... Take 1 tablet by mouth once a day    Hydrochlorothiazide 25 Mg Tabs (Hydrochlorothiazide) ..... One tab by mouth every day in the morning  Problem # 3:  ANEMIA, IRON DEFICIENCY (ICD-280.9) 2/2 menorrhagia I sent her to GYN for fibroids she says they put her on OCPs she is not taking and says her cycles are lighter repeat CBC at f/u visit get stool cards  Her updated medication list for this problem includes:    Iron 325 (65 Fe) Mg Tabs (Ferrous sulfate) .Marland Kitchen... Take 1 tablet by mouth two times a day  Problem # 4:  HOT FLASHES (ICD-627.2) conservative mgmt strategies discussed can check [place] at f/u visit with other labs  Complete Medication List: 1)  Cozaar 50 Mg Tabs (Losartan potassium) .... Take 1 tablet by mouth once a day 2)  Actos 45 Mg Tabs (Pioglitazone hcl) .... Take 1 tablet by mouth every morning 3)  Lantus Solostar Pen Needles  .... Use with solostar pen 4)  Lantus Solostar 100 Unit/ml Soln (Insulin glargine) .... Take 70  units subcutaneously each night 5)  Novolog Flexpen 100 Unit/ml Soln (Insulin aspart) .... Inject 18 units right before each meal ( three times a day ) 6)  Blood Glucose Meter Kit (Blood glucose monitoring suppl) .... Test blood glucose daily 7)  Blood Glucose Test Strp (Glucose blood) .... Test blood glucose daily 8)  Lancets Misc (Lancets) .... Test glucose  daily 9)  Tessalon Perles 100 Mg Caps (Benzonatate) .... Take 1 tablet by mouth three times a day as needed for cough 10)  Lamisil At Athletes Foot 1 % Crea (Terbinafine hcl) .... Apply two times a day until clear 11)  Valtrex 500 Mg Tabs (Valacyclovir hcl) .... Take 1 tablet by mouth two times a day x 3 days for outbreak 12)  Flonase 50 Mcg/act Susp (Fluticasone propionate) .Marland Kitchen.. 1 spray each nostril once daily for one month (use every other day for the last week) 13)  Iron 325 (65 Fe) Mg Tabs (Ferrous sulfate) .... Take 1 tablet by mouth two times a day 14)  Hydrochlorothiazide 25 Mg Tabs (Hydrochlorothiazide) .... One tab by mouth every day in the morning 15)  Metformin Hcl 500 Mg Tabs (Metformin hcl) .... Take 1 tablet by mouth two times a day for diabetes  Hypertension Assessment/Plan:      The patient's hypertensive risk group is category C: Target organ damage and/or diabetes.  Her calculated 10 year risk of coronary heart disease is 4 %.  Today's blood pressure is 121/84.  Her blood pressure goal is < 130/80.  Patient Instructions: 1)  Increase Lantus to 70 units at bedtime. 2)  Increase Novolog to 18 units three times a day (with a meal). 3)  Reschedule with the dietician. 4)  Try regular exercise to help alleviate some of your hot flashes.  In other words, try walking 15-20 minutes a day 5-7 days a week. 5)  Try a leisurely bath to help. 6)  Avoid caffeine and avoid alcohol in excess. 7)  Try benadryl 25 mg at bedtime as needed for sleep. 8)  You can use cold, damps cloths to help with some of the symptoms. 9)  Please schedule  a follow-up appointment in 6-8 weeks with Nicki Reaper for diabetes.   Last LDL:                                                 66 (06/18/2009 9:41:00 PM)        Diabetic Foot Exam Last Podiatry Exam Date: 11/03/2009  Foot Inspection Is there a history of a foot ulcer?              No Is there a foot ulcer now?              No Can the patient see the bottom of their feet?          Yes Are the shoes appropriate in style and fit?          Yes Is there swelling or an abnormal foot shape?          No Are the toenails long?                No Are the toenails thick?                No Are the toenails ingrown?              No Is there heavy callous build-up?              No Is there a claw toe deformity?                          No Is there elevated skin temperature?  No Is there limited ankle dorsiflexion?            No Is there foot or ankle muscle weakness?            No Do you have pain in calf while walking?           No      Diabetic Foot Care Education :Patient educated on appropriate care of diabetic feet.     10-g (5.07) Semmes-Weinstein Monofilament Test Performed by: [place] Shannon          Right Foot          Left Foot Visual Inspection               Test Control      normal         normal Site 1         normal         normal Site 2         normal         normal Site 3         normal         normal Site 4         normal         normal Site 5         normal         normal Site 6         normal         normal Site 7         normal         normal Site 8         normal         normal Site 9         normal         normal Site 10         normal         normal  Impression      normal         normal

## 2010-08-18 NOTE — Letter (Signed)
Summary: NUTRITION & DIABETES MANAGMENT /NO SHOW  NUTRITION & DIABETES MANAGMENT /NO SHOW   Imported By: Roland Earl 10/31/2009 10:40:03  _____________________________________________________________________  External Attachment:    Type:   Image     Comment:   External Document

## 2010-08-18 NOTE — Assessment & Plan Note (Signed)
Summary: f/u diabetes per Nicki Reaper /tmm   Vital Signs:  Patient profile:   50 year old female Height:      64.25 inches Weight:      219 pounds BMI:     37.43 Temp:     97.6 degrees F oral Pulse rate:   76 / minute Pulse rhythm:   regular Resp:     18 per minute BP sitting:   133 / 86  (right arm) Cuff size:   large  Vitals Entered By: [place] Shannon (January 08, 2010 11:23 AM) CC: follow-up visit,diabetes, patient has cold, conjested,alot of cramping off and on due to [facility], patient was in hospital a week ago..patient needs prescription refills Is Patient Diabetic? Yes Pain Assessment Patient in pain? no     Location: pelvis CBG Result 406  Does patient need assistance? Functional Status Self care Ambulation Normal   Primary Care Provider:  Richardson Dopp PA-C  CC:  follow-up visit, diabetes, patient has cold, conjested, alot of cramping off and on due to [facility], and patient was in hospital a week ago..patient needs prescription refills.  History of Present Illness: 50 year old female with a history of diabetes, hypertension and iron deficiency anemia returns for followup today.  She has a long history of noncompliance.  She comes in the office today with a sugar of 400.  She tells me that she ran out of her medicines about a month ago.  She visited someone in [place].  She got back last week and went to the hospital with chest pain.  The discharge summary is incomplete in the system.  However, it appears that she ruled out for myocardial infarction by enzymes.  Her Myoview study was done with exercise and her images demonstrated an EF of 63%, diaphragmatic attenuation of the inferior wall and no exercise-induced ischemia.  She had an echocardiogram that demonstrated normal LV function with an EF of 60-65%.  Wall thickness was normal.  She had a calcified mitral valve annulus.  Her PA pressure was elevated at 37 mm of mercury.  She had no abnormality on her chest x-ray.   Creatinine was 0.64.  LFTs were okay.  Hemoglobin was 11.8.  Total cholesterol 138,triglycerides 106 HDL 66 and LDL 51.  Her hemoglobin A1c was 15.5 TSH normal at 1.159.  In the office today she noticed that she's had URI symptoms for the last couple of days.  She notes sneezing and rhinorrhea.  She denies fevers, chills, cough, otalgia or sore throat.  She is still not on any of her medications except for metformin and ferrous sulfate.  She is supposed to be on Actos and Lantus and Cozaar and sliding scale insulin.  Again, she is not taking any of these.  She was apparently provided with some prescriptions when she left the hospital.  However these have not been filled.  She feels that she contacted the pharmacy on [address] and was told that she waited too long and her prescriptions were put back on the shelf.  She still has pain on occasion in her chest.  This is a left-sided pain is sharp and lasts for a minute or less.  Exertion does not particularly bring it on.  She does note some DOE.  She denies any radiation, nausea or diaphoresis.    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) 8)  Preventive Health Care  (ICD-V70.0) 9)  Family History Diabetes 1st Degree Relative  (ICD-V18.0) 10)  Tinea Pedis  (ICD-110.4) 11)  Cough  (ICD-786.2) 12)  Skin Lesion  (ICD-709.9) 13)  Uri  (ICD-465.9) 14)  Vaginitis, Bacterial, Recurrent  (ICD-616.10) 15)  Leiomyoma, Uterus  (ICD-218.9) 16)  Diabetes Mellitus, Type II, On Insulin, Uncontrolled  (ICD-250.72) 17)  Left Ovarian Calcifications  (ICD-239.5) 18)  Ovarian Cyst, Right  (ICD-620.2) 19)  Trichomonal Vaginitis  (ICD-131.01) 20)  Candidiasis of Vulva and Vagina  (ICD-112.1) 21)  Screening For Mlig Neop, Breast, Nos   (ICD-V76.10) 22)  Sexually Transmitted Disease, Exposure To  (ICD-V01.6) 23)  Screening For Malignant Neoplasm of The Cervix  (ICD-V76.2) 24)  Screening For Pulmonary Tb  (ICD-V74.1) 25)  Uri  (ICD-465.9) 26)  Substance Abuse, Multiple  (ICD-305.90) 27)  Hypertension  (ICD-401.9)  Current Medications (verified): 1)  Cozaar 50 Mg Tabs (Losartan Potassium) .... Take 1 Tablet By Mouth Once A Day 2)  Actos 45 Mg Tabs (Pioglitazone Hcl) .... Take 1 Tablet By Mouth Every Morning 3)  Lantus Solostar Pen Needles .... Use With Solostar Pen 4)  Lantus Solostar 100 Unit/ml  Soln (Insulin Glargine) .... Take 65 Units Subcutaneously Each Night 5)  Novolog Flexpen 100 Unit/ml Soln (Insulin Aspart) .... Inject 17 Units Right Before Each Meal ( Three Times A Day ) 6)  Blood Glucose Meter  Kit (Blood Glucose Monitoring Suppl) .... Test Blood Glucose Daily 7)  Blood Glucose Test  Strp (Glucose Blood) .... Test Blood Glucose Daily 8)  Lancets  Misc (Lancets) .... Test Glucose Daily 9)  Tessalon Perles 100 Mg Caps (Benzonatate) .... Take 1 Tablet By Mouth Three Times A Day As Needed For Cough 10)  Lamisil At Athletes Foot 1 % Crea (Terbinafine Hcl) .... Apply Two Times A Day Until Clear 11)  Valtrex 500 Mg Tabs (Valacyclovir Hcl) .... Take 1 Tablet By Mouth Two Times A Day X 3 Days For Outbreak 12)  Flonase 50 Mcg/act Susp (Fluticasone Propionate) .Marland Kitchen.. 1 Spray Each Nostril Once Daily For One Month (Use Every Other Day For The Last Week) 13)  Iron 325 (65 Fe) Mg Tabs (Ferrous Sulfate) .... Take 1 Tablet By Mouth Two Times A Day 14)  Hydrochlorothiazide 25 Mg Tabs (Hydrochlorothiazide) .... One Tab By Mouth Every Day in The Morning 15)  Metformin Hcl 500 Mg Tabs (Metformin Hcl) .... Take 1 Tablet By Mouth Two Times A Day For Diabetes 16)  Docusate Sodium 100 Mg Caps (Docusate Sodium) .... Take One Pill By Mouth Two Times A Day As Needed 17)  Nitrostat 0.4 Mg Subl (Nitroglycerin) .... Take One Pill Sublingal Every  5 Min. For Chest Pain.  If You Need To Take More Than 3, You Need To Go To Ed 18)  Pantoprazole Sodium 40 Mg Tbec (Pantoprazole Sodium) .... One Tab By Mouth  Allergies (verified): No Known Drug Allergies  Past History:  Past Medical History: Last updated: 06/18/2009 SUBSTANCE ABUSE, MULTIPLE (ICD-305.90) HYPERTENSION (ICD-401.9) DIABETES MELLITUS, TYPE II (ICD-250.00) POOR COMPLIANCE Fibroids h/o STD  Social History: Reviewed history from 06/18/2009 and no changes required. Active drug user...cocaine/MJ/ETOH.    a.  06/2009:  patient notes she is clean x 3 mos.    b.  drinking ETOH on occ.  Married but separated Former Smoker 06/2009: not currently sex active Occupation: Unemployed private duty care giver  Review  of Systems      See HPI General:  Denies fever. ENT:  See HPI. CV:  See HPI. Resp:  Denies cough and coughing up blood. GI:  Denies bloody stools, dark tarry stools, diarrhea, and vomiting. GU:  Denies hematuria. Endo:  Complains of excessive thirst and excessive urination.  Physical Exam  General:  alert, well-developed, and well-nourished.   Head:  normocephalic and atraumatic.   Ears:  R ear normal and L ear normal.   Nose:  no external deformity.   Mouth:  OP pink without exudate mucus membranes appear dry Neck:  supple and no cervical lymphadenopathy.   Lungs:  normal breath sounds, no crackles, and no wheezes.   Heart:  normal rate, regular rhythm, and no murmur.   Abdomen:  soft, non-tender, normal bowel sounds, and no hepatomegaly.   Msk:  normal ROM.   Extremities:  no edema Neurologic:  alert & oriented X3 and cranial nerves II-XII intact.   Psych:  normally interactive.     Impression & Recommendations:  Problem # 1:  DIABETES MELLITUS, TYPE II, ON INSULIN, UNCONTROLLED (ICD-250.72) Assessment Deteriorated  NONCOMPLIANT  I have discussed with her the importance of treating her diabetes I have again explained to her that she is at  risk of developing ESRD, blindness, etc  Give Reg Insulin 15 units in the office she is not tachycardic  she is somewhat dehydrated but does not appear toxic she is to push by mouth fluids restart her meds will call [facility]. today to fill all meds f/u with nurse visit tomorrow to recheck sugar  she is going to need close f/u often to get her on the right track   Her updated medication list for this problem includes:    Cozaar 50 Mg Tabs (Losartan potassium) .Marland Kitchen... Take 1 tablet by mouth once a day    Actos 45 Mg Tabs (Pioglitazone hcl) .Marland Kitchen... Take 1 tablet by mouth every morning    Lantus Solostar 100 Unit/ml Soln (Insulin glargine) .Marland Kitchen... Take 65 units subcutaneously each night    Novolog Flexpen 100 Unit/ml Soln (Insulin aspart) ..... Inject 17 units right before each meal ( three times a day )    Metformin Hcl 500 Mg Tabs (Metformin hcl) .Marland Kitchen... Take 1 tablet by mouth two times a day for diabetes  Orders: Capillary Blood Glucose/CBG [id number]) Insulin 5 units [id number])  Problem # 2:  URI (ICD-465.9) conservative mgmt rec over the counter antihistamine, tylenol, fluids, f/u as needed  The following medications were removed from the medication list:    Tessalon Perles 100 Mg Caps (Benzonatate) .Marland Kitchen... Take 1 tablet by mouth three times a day as needed for cough  Problem # 3:  CHEST PAIN UNSPECIFIED (ICD-786.50) atypical myoview neg; echo normal  refill protonix to cover for GERD  Problem # 4:  ANEMIA, IRON DEFICIENCY (ICD-280.9) continue iron  Her updated medication list for this problem includes:    Iron 325 (65 Fe) Mg Tabs (Ferrous sulfate) .Marland Kitchen... Take 1 tablet by mouth two times a day  Problem # 5:  HYPERTENSION (ICD-401.9) restart cozaar hold off on HCTZ for now  Her updated medication list for this problem includes:    Cozaar 50 Mg Tabs (Losartan potassium) .Marland Kitchen... Take 1 tablet by mouth once a day    Hydrochlorothiazide 25 Mg Tabs (Hydrochlorothiazide) .Marland Kitchen... Do not take for  now  Complete Medication List: 1)  Cozaar 50 Mg Tabs (Losartan potassium) .... Take 1 tablet by mouth once a day  2)  Actos 45 Mg Tabs (Pioglitazone hcl) .... Take 1 tablet by mouth every morning 3)  Lantus Solostar Pen Needles  .... Use with solostar pen 4)  Lantus Solostar 100 Unit/ml Soln (Insulin glargine) .... Take 65 units subcutaneously each night 5)  Novolog Flexpen 100 Unit/ml Soln (Insulin aspart) .... Inject 17 units right before each meal ( three times a day ) 6)  Blood Glucose Meter Kit (Blood glucose monitoring suppl) .... Test blood glucose daily 7)  Blood Glucose Test Strp (Glucose blood) .... Test blood glucose daily 8)  Lancets Misc (Lancets) .... Test glucose daily 9)  Valtrex 500 Mg Tabs (Valacyclovir hcl) .... Take 1 tablet by mouth two times a day x 3 days for outbreak 10)  Iron 325 (65 Fe) Mg Tabs (Ferrous sulfate) .... Take 1 tablet by mouth two times a day 11)  Hydrochlorothiazide 25 Mg Tabs (Hydrochlorothiazide) .... Do not take for now 12)  Metformin Hcl 500 Mg Tabs (Metformin hcl) .... Take 1 tablet by mouth two times a day for diabetes 13)  Nitrostat 0.4 Mg Subl (Nitroglycerin) .... Take one pill sublingal every 5 min. for chest pain.  if you need to take more than 3, you need to go to ed 14)  Pantoprazole Sodium 40 Mg Tbec (Pantoprazole sodium) .... One tab by mouth  Patient Instructions: 1)  Get Zyrtec over the counter.  Take 10 once daily as needed for runny nose, etc.  Do not start for 24-48 hours.  You can also use saline nose spray. 2)  Take Tylenol 500 mg 1-2 tabs every 6 hours as needed for pain or fever. 3)  Follow up sooner if worse. 4)  Get your medicines today from [place]. 5)  Follow up nurse visit tomorrow to recheck your sugar.  Bring all of your medicines with you. 6)  Drink plenty of water.  Drink 6-8 glasses per day.  Avoid caffeine for now. 7)  If you feel worse go to the emergency room. Prescriptions: NITROSTAT 0.4 MG SUBL  (NITROGLYCERIN) take one pill sublingal every 5 min. for chest pain.  if you need to take more than 3, you need to go to ED  #25 x 0   Entered and Authorized by:   Richardson Dopp PA-C   Signed by:   Richardson Dopp PA-C on 01/08/2010   Method used:   Printed then faxed to ...       [facility] (retail)       [place], [place]  [address]       Ph: [phone]       Fax: [phone]   RxID:   [id number] METFORMIN HCL 500 MG TABS (METFORMIN HCL) Take 1 tablet by mouth two times a day for diabetes  #60 x 5   Entered and Authorized by:   Richardson Dopp PA-C   Signed by:   Richardson Dopp PA-C on 01/08/2010   Method used:   Printed then faxed to ...       [facility] (retail)       [place], [place]  [address]       Ph: [phone]       Fax: [phone]   RxID:   [id number] PANTOPRAZOLE SODIUM 40 MG TBEC (PANTOPRAZOLE SODIUM) one tab by mouth  #30  x 2   Entered and Authorized by:   Richardson Dopp PA-C   Signed by:   Richardson Dopp PA-C on 01/08/2010   Method used:   Printed then faxed to ...       [facility] (retail)       [place], [place]  [address]       Ph: [phone]       Fax: [phone]   RxID:   [id number] IRON 325 (65 FE) MG TABS (FERROUS SULFATE) Take 1 tablet by mouth two times a day  #60 x 5   Entered and Authorized by:   Richardson Dopp PA-C   Signed by:   Richardson Dopp PA-C on 01/08/2010   Method used:   Printed then faxed to ...       [facility] (retail)       [place], [place]  [address]       Ph: [phone]       Fax: [phone]   RxID:   [id number] LANCETS  MISC (LANCETS) test glucose daily  #100 x 11   Entered and Authorized by:   Richardson Dopp PA-C   Signed by:   Richardson Dopp PA-C on 01/08/2010   Method used:   Printed then faxed to ...       [facility]  (retail)       [place], [place]  [address]       Ph: [phone]       Fax: [phone]   RxID:   [id number] BLOOD GLUCOSE TEST  STRP (GLUCOSE BLOOD) test blood glucose daily  #100 x 11   Entered and Authorized by:   Richardson Dopp PA-C   Signed by:   Richardson Dopp PA-C on 01/08/2010   Method used:   Printed then faxed to ...       [facility] (retail)       [place], [place]  [address]       Ph: [phone]       Fax: [phone]   RxID:   [id number] BLOOD GLUCOSE METER  KIT (BLOOD GLUCOSE MONITORING SUPPL) Test blood glucose daily  #1 x 0   Entered and Authorized by:   Richardson Dopp PA-C   Signed by:   Richardson Dopp PA-C on 01/08/2010   Method used:   Printed then faxed to ...       [facility] (retail)       [place], [place]  [address]       Ph: [phone]       Fax: [phone]   RxID:   [id number] NOVOLOG FLEXPEN 100 UNIT/ML SOLN (INSULIN ASPART) Inject 17 units right before each meal ( three times a day )  #1 mos supply x 11   Entered and Authorized by:   Richardson Dopp PA-C   Signed by:   Richardson Dopp PA-C on 01/08/2010   Method used:   Printed then faxed to ...       [facility] (retail)  [place], [place]  [address]       Ph: [phone]       Fax: [phone]   RxID:   [id number] LANTUS SOLOSTAR 100 UNIT/ML  SOLN (INSULIN GLARGINE) Take 65 units Subcutaneously each night  #1 mos supply x 11   Entered and Authorized by:   Richardson Dopp PA-C   Signed by:   Richardson Dopp PA-C on 01/08/2010   Method used:   Printed then faxed to ...       [facility] (retail)       [place], [place]  [address]       Ph: [phone]       Fax: [phone]   RxID:   [id number] LANTUS SOLOSTAR PEN NEEDLES use with solostar pen  #30 x 11    Entered and Authorized by:   Richardson Dopp PA-C   Signed by:   Richardson Dopp PA-C on 01/08/2010   Method used:   Printed then faxed to ...       [facility] (retail)       [place], [place]  [address]       Ph: [phone]       Fax: [phone]   RxID:   [id number] ACTOS 45 MG TABS (PIOGLITAZONE HCL) Take 1 tablet by mouth every morning  #30 x 5   Entered and Authorized by:   Richardson Dopp PA-C   Signed by:   Richardson Dopp PA-C on 01/08/2010   Method used:   Printed then faxed to ...       [facility] (retail)       [place], [place]  [address]       Ph: [phone]       Fax: [phone]   RxID:   [id number] COZAAR 50 MG TABS (LOSARTAN POTASSIUM) Take 1 tablet by mouth once a day  #30 x 5   Entered and Authorized by:   Richardson Dopp PA-C   Signed by:   Richardson Dopp PA-C on 01/08/2010   Method used:   Printed then faxed to ...       [facility] (retail)       [address] [place],   [address]       Ph: [phone]       Fax: [phone]   RxID:   [id number]

## 2010-08-18 NOTE — Letter (Signed)
Summary: TEST ORDER FORM/MAMMOGRAM//APPT DATE & TIME  TEST ORDER FORM/MAMMOGRAM//APPT DATE & TIME   Imported By: Roland Earl 08/08/2009 14:39:26  _____________________________________________________________________  External Attachment:    Type:   Image     Comment:   External Document

## 2010-08-18 NOTE — Letter (Signed)
Summary: *HSN Results Follow up  [facility]  [address] [place], [place] [address]   Phone: [phone]  Fax: [phone]      05/07/2010   [name], [name]   Dear  [name],                            ____S.Drinkard,FNP   ____D. Gore,FNP       ____B. McPherson,MD   ____V. Rankins,MD    ____E. Mulberry,MD    ____N. Hassell Done, FNP  ____D. Jobe Igo, MD    ____K. Tomma Lightning, MD    ____Other     This letter is to inform you that your recent test(s):  _______Pap Smear    _____X__Lab Test     _______X-ray    _______ is within acceptable limits  _______ requires a medication change  ___X____ requires a follow-up lab visit  _______ requires a follow-up visit with your provider   Comments:  We have been trying to reach you to come in for a lab test. Please call the office to schedule lab appointment.       _________________________________________________________ If you have any questions, please contact our office                     Sincerely,  [place] Shannon [facility]

## 2010-08-18 NOTE — Assessment & Plan Note (Signed)
Summary: 2 week f/u dm per Nicki Reaper /tmm   Vital Signs:  Patient profile:   50 year old female Height:      64.25 inches Weight:      215 pounds BMI:     36.75 Temp:     97.7 degrees F oral Pulse rate:   80 / minute Pulse rhythm:   regular Resp:     18 per minute BP sitting:   137 / 79  (left arm) Cuff size:   large  Vitals Entered By: [place] Shannon (October 03, 2009 11:49 AM) CC: two week f/u...., Hypertension Management Is Patient Diabetic? Yes Pain Assessment Patient in pain? no      CBG Result 353  Does patient need assistance? Functional Status Self care Ambulation Normal   Primary Care Provider:  Richardson Dopp PA-C  CC:  two week f/u.... and Hypertension Management.  History of Present Illness: Here for f/u.  DM:  Taking Actos.  Lantus 65 units at bedtime.  Novolog 15 units with each meal.  Sugars at home have been high.  Some readings were undetectable on her meter.  Some 200-300s at home.  One reading was 160.  Still notes polyuria and polydipsia.  No nausea.  No abdominal pain.    HTN:  Out of Cozaar for a week.  Picks it up today.  Hypertension History:      She denies chest pain, dyspnea with exertion, and syncope.        Positive major cardiovascular risk factors include diabetes and hypertension.  Negative major cardiovascular risk factors include female age less than 59 years old, negative family history for ischemic heart disease, and non-tobacco-user status.     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) 7)  Preventive Health Care  (ICD-V70.0) 8)  Family History Diabetes 1st Degree Relative  (ICD-V18.0) 9)  Tinea Pedis  (ICD-110.4) 10)  Cough  (ICD-786.2) 11)  Skin  Lesion  (ICD-709.9) 12)  Uri  (ICD-465.9) 13)  Vaginitis, Bacterial, Recurrent  (ICD-616.10) 14)  Leiomyoma, Uterus  (ICD-218.9) 15)  Diabetes Mellitus, Type II, On Insulin, Uncontrolled  (ICD-250.72) 16)  Left Ovarian Calcifications  (ICD-239.5) 17)  Ovarian Cyst, Right  (ICD-620.2) 18)  Trichomonal Vaginitis  (ICD-131.01) 19)  Candidiasis of Vulva and Vagina  (ICD-112.1) 20)  Screening For Mlig Neop, Breast, Nos  (ICD-V76.10) 21)  Sexually Transmitted Disease, Exposure To  (ICD-V01.6) 22)  Screening For Malignant Neoplasm of The Cervix  (ICD-V76.2) 23)  Screening For Pulmonary Tb  (ICD-V74.1) 24)  Uri  (ICD-465.9) 25)  Substance Abuse, Multiple  (ICD-305.90) 26)  Hypertension  (ICD-401.9)  Current Medications (verified): 1)  Cozaar 50 Mg Tabs (Losartan Potassium) .... Take 1 Tablet By Mouth Once A Day 2)  Actos 45 Mg Tabs (Pioglitazone Hcl) .... Take 1 Tablet By Mouth Every Morning 3)  Lantus Solostar Pen Needles .... Use With Solostar Pen 4)  Lantus Solostar 100 Unit/ml  Soln (Insulin Glargine) .... Take 65 Units Subcutaneously Each Night 5)  Novolog Flexpen 100 Unit/ml Soln (Insulin Aspart) .... Inject 15 Units Right Before Each Meal ( Three Times A Day ) 6)  Blood Glucose Meter  Kit (Blood Glucose Monitoring Suppl) .Marland KitchenMarland KitchenMarland Kitchen  Test Blood Glucose Daily 7)  Blood Glucose Test  Strp (Glucose Blood) .... Test Blood Glucose Daily 8)  Lancets  Misc (Lancets) .... Test Glucose Daily 9)  Tessalon Perles 100 Mg Caps (Benzonatate) .... Take 1 Tablet By Mouth Three Times A Day As Needed For Cough 10)  Lamisil At Athletes Foot 1 % Crea (Terbinafine Hcl) .... Apply Two Times A Day Until Clear 11)  Valtrex 500 Mg Tabs (Valacyclovir Hcl) .... Take 1 Tablet By Mouth Two Times A Day X 3 Days For Outbreak 12)  Flonase 50 Mcg/act Susp (Fluticasone Propionate) .Marland Kitchen.. 1 Spray Each Nostril Once Daily For One Month (Use Every Other Day For The Last Week) 13)  Iron 325 (65 Fe) Mg Tabs (Ferrous Sulfate) .... Take 1  Tablet By Mouth Two Times A Day 14)  Hydrochlorothiazide 25 Mg Tabs (Hydrochlorothiazide) .... One Tab By Mouth Every Day in The Morning  Allergies (verified): No Known Drug Allergies  Past History:  Past Medical History: Last updated: 06/18/2009 SUBSTANCE ABUSE, MULTIPLE (ICD-305.90) HYPERTENSION (ICD-401.9) DIABETES MELLITUS, TYPE II (ICD-250.00) POOR COMPLIANCE Fibroids h/o STD  Physical Exam  General:  alert, well-developed, and well-nourished.   Head:  normocephalic and atraumatic.   Neck:  supple.   Lungs:  normal breath sounds.   Heart:  normal rate and regular rhythm.   Neurologic:  alert & oriented X3 and cranial nerves II-XII intact.   Skin:  turgor normal.   Psych:  normally interactive and good eye contact.     Impression & Recommendations:  Problem # 1:  DIABETES MELLITUS, TYPE II, ON INSULIN, UNCONTROLLED (ICD-250.72) still uncontrolled add metformin back  increase Lantus to 70 increase novolog to 17 three times a day  f/u in one month  Her updated medication list for this problem includes:    Cozaar 50 Mg Tabs (Losartan potassium) .Marland Kitchen... Take 1 tablet by mouth once a day    Actos 45 Mg Tabs (Pioglitazone hcl) .Marland Kitchen... Take 1 tablet by mouth every morning    Lantus Solostar 100 Unit/ml Soln (Insulin glargine) .Marland Kitchen... Take 70 units subcutaneously each night    Novolog Flexpen 100 Unit/ml Soln (Insulin aspart) ..... Inject 17 units right before each meal ( three times a day )    Metformin Hcl 500 Mg Tabs (Metformin hcl) .Marland Kitchen... Take 1 tablet by mouth two times a day for diabetes  Problem # 2:  HYPERTENSION (ICD-401.9) above goal but no Cozaar in a week restart and check at f/u visit  Her updated medication list for this problem includes:    Cozaar 50 Mg Tabs (Losartan potassium) .Marland Kitchen... Take 1 tablet by mouth once a day    Hydrochlorothiazide 25 Mg Tabs (Hydrochlorothiazide) ..... One tab by mouth every day in the morning  Problem # 3:  ANEMIA, IRON DEFICIENCY  (ICD-280.9)  related to heavy periods started on OCPs per [facility] not sure of name periods lighter  says she did not need fibroids treated get stool cards pt now taking iron  Her updated medication list for this problem includes:    Iron 325 (65 Fe) Mg Tabs (Ferrous sulfate) .Marland Kitchen... Take 1 tablet by mouth two times a day  Orders: Hemoccult Cards -3 specimans (take home) [id number])  Complete Medication List: 1)  Cozaar 50 Mg Tabs (Losartan potassium) .... Take 1 tablet by mouth once a day 2)  Actos 45 Mg Tabs (Pioglitazone hcl) .... Take 1 tablet by mouth every morning 3)  Lantus Solostar Pen Needles  .... Use with solostar  pen 4)  Lantus Solostar 100 Unit/ml Soln (Insulin glargine) .... Take 70 units subcutaneously each night 5)  Novolog Flexpen 100 Unit/ml Soln (Insulin aspart) .... Inject 17 units right before each meal ( three times a day ) 6)  Blood Glucose Meter Kit (Blood glucose monitoring suppl) .... Test blood glucose daily 7)  Blood Glucose Test Strp (Glucose blood) .... Test blood glucose daily 8)  Lancets Misc (Lancets) .... Test glucose daily 9)  Tessalon Perles 100 Mg Caps (Benzonatate) .... Take 1 tablet by mouth three times a day as needed for cough 10)  Lamisil At Athletes Foot 1 % Crea (Terbinafine hcl) .... Apply two times a day until clear 11)  Valtrex 500 Mg Tabs (Valacyclovir hcl) .... Take 1 tablet by mouth two times a day x 3 days for outbreak 12)  Flonase 50 Mcg/act Susp (Fluticasone propionate) .Marland Kitchen.. 1 spray each nostril once daily for one month (use every other day for the last week) 13)  Iron 325 (65 Fe) Mg Tabs (Ferrous sulfate) .... Take 1 tablet by mouth two times a day 14)  Hydrochlorothiazide 25 Mg Tabs (Hydrochlorothiazide) .... One tab by mouth every day in the morning 15)  Metformin Hcl 500 Mg Tabs (Metformin hcl) .... Take 1 tablet by mouth two times a day for diabetes  Hypertension Assessment/Plan:      The patient's hypertensive risk group is category C:  Target organ damage and/or diabetes.  Her calculated 10 year risk of coronary heart disease is 4 %.  Today's blood pressure is 137/79.  Her blood pressure goal is < 130/80.  Patient Instructions: 1)  Please schedule a follow-up appointment in 1 month with Bedford Winsor for diabetes and blood pressure.  2)  Complete stool cards and return to the office. 3)  Increase Lantus to 70 units a day. 4)  Increase Novolog to 17 units before each meal ( three times a day). 5)  Start metformin two times a day and take with food. 6)  Check sugars three times a day before each meal. 7)  Bring in record to next appointment. Prescriptions: BLOOD GLUCOSE TEST  STRP (GLUCOSE BLOOD) test blood glucose daily  #100 x 11   Entered and Authorized by:   Richardson Dopp PA-C   Signed by:   Richardson Dopp PA-C on 10/03/2009   Method used:   Print then Give to Patient   RxID:   [id number] METFORMIN HCL 500 MG TABS (METFORMIN HCL) Take 1 tablet by mouth two times a day for diabetes  #60 x 5   Entered and Authorized by:   Richardson Dopp PA-C   Signed by:   Richardson Dopp PA-C on 10/03/2009   Method used:   Print then Give to Patient   RxID:   [id number]

## 2010-08-18 NOTE — Assessment & Plan Note (Signed)
Summary: DM/ GK   Vital Signs:  Patient profile:   50 year old female Height:      64.25 inches Weight:      221 pounds BMI:     37.78 Temp:     97.5 degrees F oral Pulse rate:   78 / minute Pulse rhythm:   regular Resp:     18 per minute BP sitting:   120 / 78  (right arm) Cuff size:   large  Vitals Entered By: [place] Shannon (February 19, 2010 10:22 AM) CC: follow-up visit diabetes, patient did state she ate before visit. Is Patient Diabetic? Yes CBG Result 334  Does patient need assistance? Functional Status Self care Ambulation Normal   Primary Care Provider:  Richardson Dopp PA-C  CC:  follow-up visit diabetes and patient did state she ate before visit.Marland Kitchen  History of Present Illness: Here for f/u on diabetes.  Does not have medicines with her.  States she got meds filled and is taking.  States she is taking Lantus every day and Novolog with each meal.  BUT, she ate at [place] this morning and had a chicken biscuit and put jelly on it and had a glass of OJ.  She did not take her novolog this am.  She is feeling pretty good.  She denies any problems with taking her insulin.  Notes her polyuria is better.  Thirst is better.  She missed her appt with the DM class in April and never rescheduled.    Bilat. breast pain:  x 2 weeks.  Never had before.  Sharp.  Feels like needles.  Not related to anything she does.  Does not seem to be related to cycle.  Only lasts for a second or 2.  No lumps.  No discharge from nipples.  No fevers or weight loss.  No night sweats.    Also, notes some burning when she washes around her vaginal area.  No discharge noted.  No sexual activity.  Current Medications (verified): 1)  Cozaar 50 Mg Tabs (Losartan Potassium) .... Take 1 Tablet By Mouth Once A Day 2)  Actos 45 Mg Tabs (Pioglitazone Hcl) .... Take 1 Tablet By Mouth Every Morning 3)  Lantus Solostar Pen Needles .... Use With Solostar Pen 4)  Lantus Solostar 100 Unit/ml  Soln (Insulin Glargine)  .... Take 65 Units Subcutaneously Each Night 5)  Novolog Flexpen 100 Unit/ml Soln (Insulin Aspart) .... Inject 17 Units Right Before Each Meal ( Three Times A Day ) 6)  Blood Glucose Meter  Kit (Blood Glucose Monitoring Suppl) .... Test Blood Glucose Daily 7)  Blood Glucose Test  Strp (Glucose Blood) .... Test Blood Glucose Daily 8)  Lancets  Misc (Lancets) .... Test Glucose Daily 9)  Valtrex 500 Mg Tabs (Valacyclovir Hcl) .... Take 1 Tablet By Mouth Two Times A Day X 3 Days For Outbreak 10)  Iron 325 (65 Fe) Mg Tabs (Ferrous Sulfate) .... Take 1 Tablet By Mouth Two Times A Day 11)  Hydrochlorothiazide 25 Mg Tabs (Hydrochlorothiazide) .... Do Not Take For Now 12)  Metformin Hcl 500 Mg Tabs (Metformin Hcl) .... Take 1 Tablet By Mouth Two Times A Day For Diabetes 13)  Nitrostat 0.4 Mg Subl (Nitroglycerin) .... Take One Pill Sublingal Every 5 Min. For Chest Pain.  If You Need To Take More Than 3, You Need To Go To Ed 14)  Pantoprazole Sodium 40 Mg Tbec (Pantoprazole Sodium) .... One Tab By Mouth  Allergies (verified): No Known Drug  Allergies  Physical Exam  General:  alert, well-developed, and well-nourished.   Head:  normocephalic and atraumatic.   Neck:  supple and no cervical lymphadenopathy.   Breasts:  skin/areolae normal, no masses, no abnormal thickening, no nipple discharge, no tenderness, and no adenopathy.   Lungs:  normal breath sounds, no crackles, and no wheezes.   Heart:  normal rate, regular rhythm, and no murmur.   Genitalia:  normal introitus, no external lesions, mucosa pink and moist, no vaginal or cervical lesions, no vaginal atrophy, and no friaility or hemorrhage.   grey/green discharge noted  Extremities:  no edema  Neurologic:  alert & oriented X3 and cranial nerves II-XII intact.     Impression & Recommendations:  Problem # 1:  DIABETES MELLITUS, TYPE II, ON INSULIN, UNCONTROLLED (ICD-250.72)  I have had another conversation with her today about controlling  her diabetes I have told her she is at risk to develop blindness, ESRD or neuropathy I do not feel that she is compliant with diet I think she may be taking her insulin and meds correctly if she is taking Actos, metformin, lantus and novolog as directed; her diet is a HUGE offender I have asked her to reschedule with the diabetic educators at [facility] . Marland Kitchen . I think she needs intensive diet training  change Lantus to 70 units at bedtime I have asked her to get a machine to check her sugars will have her check sugars for 3 days intensively f/u with me so we can adjust I would like her to start a sliding scale and will try to implement at f/u  Her updated medication list for this problem includes:    Cozaar 50 Mg Tabs (Losartan potassium) .Marland Kitchen... Take 1 tablet by mouth once a day    Actos 45 Mg Tabs (Pioglitazone hcl) .Marland Kitchen... Take 1 tablet by mouth every morning    Lantus Solostar 100 Unit/ml Soln (Insulin glargine) .Marland Kitchen... Take 70 units subcutaneously each night    Novolog Flexpen 100 Unit/ml Soln (Insulin aspart) ..... Inject 17 units right before each meal ( three times a day )    Metformin Hcl 500 Mg Tabs (Metformin hcl) .Marland Kitchen... Take 1 tablet by mouth two times a day for diabetes  Orders: Capillary Blood Glucose/CBG ([id number]) Hemoglobin A1C [id number])  Problem # 2:  HYPERTENSION (ICD-401.9) CONTROLLED  Her updated medication list for this problem includes:    Cozaar 50 Mg Tabs (Losartan potassium) .Marland Kitchen... Take 1 tablet by mouth once a day    Hydrochlorothiazide 25 Mg Tabs (Hydrochlorothiazide) .Marland Kitchen... Do not take for now  Problem # 3:  BREAST PAIN, BILATERAL (ICD-611.71) probably related to size advised her to use supportive measures check mammo  Orders: Mammogram (Screening) (Mammo)  Problem # 4:  TRICHOMONAL VAGINITIS (ICD-131.01) treat  handout given  Complete Medication List: 1)  Cozaar 50 Mg Tabs (Losartan potassium) .... Take 1 tablet by mouth once a day 2)  Actos 45 Mg Tabs  (Pioglitazone hcl) .... Take 1 tablet by mouth every morning 3)  Lantus Solostar Pen Needles  .... Use with solostar pen 4)  Lantus Solostar 100 Unit/ml Soln (Insulin glargine) .... Take 70 units subcutaneously each night 5)  Novolog Flexpen 100 Unit/ml Soln (Insulin aspart) .... Inject 17 units right before each meal ( three times a day ) 6)  Blood Glucose Meter Kit (Blood glucose monitoring suppl) .... Test blood glucose daily 7)  Blood Glucose Test Strp (Glucose blood) .... Test blood glucose daily 8)  Lancets  Misc (Lancets) .... Test glucose daily 9)  Valtrex 500 Mg Tabs (Valacyclovir hcl) .... Take 1 tablet by mouth two times a day x 3 days for outbreak 10)  Iron 325 (65 Fe) Mg Tabs (Ferrous sulfate) .... Take 1 tablet by mouth two times a day 11)  Hydrochlorothiazide 25 Mg Tabs (Hydrochlorothiazide) .... Do not take for now 12)  Metformin Hcl 500 Mg Tabs (Metformin hcl) .... Take 1 tablet by mouth two times a day for diabetes 13)  Nitrostat 0.4 Mg Subl (Nitroglycerin) .... Take one pill sublingal every 5 min. for chest pain.  if you need to take more than 3, you need to go to ed 14)  Pantoprazole Sodium 40 Mg Tbec (Pantoprazole sodium) .... One tab by mouth 15)  Flagyl 500 Mg Tabs (Metronidazole) .... Take 1 tablet by mouth two times a day  Patient Instructions: 1)  Schedule eligibility appointment. 2)  Reschedule appointment with Diabetic Education at [facility]  Appointment missed in 12/12/2022 due to death in family and never was rescheduled. 3)  Take Flagyl until all gone.  See handout.  Your partner needs to be treated as well. 4)  Get your machine.  Check your sugars three times a day with meals. 5)  See the handout I gave you.  I want you to fill this out before coming back.  You only have to do this chart of 3 days. 6)  Ask the nurses at your diabetes class about "Sliding Scale Insulin" so they can explain.   7)  Please schedule a follow-up appointment in 1 month with Brinn Westby for  diabetes. 8)  Bring your medicines in to all visits for me to look at. 9)    Prescriptions: LANCETS  MISC (LANCETS) test glucose daily  #100 x 11   Entered and Authorized by:   Richardson Dopp PA-C   Signed by:   Richardson Dopp PA-C on 02/19/2010   Method used:   Printed then faxed to ...       [facility] (retail)       [place], [place]  [address]       Ph: [phone] [place]       Fax: [phone]   RxID:   [id number] BLOOD GLUCOSE TEST  STRP (GLUCOSE BLOOD) test blood glucose daily  #100 x 11   Entered and Authorized by:   Richardson Dopp PA-C   Signed by:   Richardson Dopp PA-C on 02/19/2010   Method used:   Printed then faxed to ...       [facility] (retail)       [place], [place]  [address]       Ph: [phone] [place]       Fax: [phone]   RxID:   [id number] BLOOD GLUCOSE METER  KIT (BLOOD GLUCOSE MONITORING SUPPL) Test blood glucose daily  #1 x 0   Entered and Authorized by:   Richardson Dopp PA-C   Signed by:   Richardson Dopp PA-C on 02/19/2010   Method used:   Printed then faxed to ...       [facility] (retail)       [place],   [address]       Ph: [phone]       Fax: [phone]   RxID:  [id number] FLAGYL 500 MG TABS (METRONIDAZOLE) Take 1 tablet by mouth two times a day  #14 x 0   Entered and Authorized by:   Richardson Dopp PA-C   Signed by:   Richardson Dopp PA-C on 02/19/2010   Method used:   Faxed to ...       [facility] (retail)       [place], [place]  [address]       Ph: [phone] [place]       Fax: [phone]   RxID:   [id number] LANTUS SOLOSTAR 100 UNIT/ML  SOLN (INSULIN GLARGINE) Take 70 units Subcutaneously each night  #1 mo supply x 11   Entered and Authorized by:   Richardson Dopp PA-C   Signed by:   Richardson Dopp PA-C on 02/19/2010   Method used:   Faxed to ...       [facility] (retail)       [place], [place]  [address]       Ph: [phone] [place]       Fax: [phone]   RxID:   [id number]   Laboratory Results   Blood Tests     HGBA1C: 14%   (Normal Range: Non-Diabetic - 3-6%   Control Diabetic - 6-8%) CBG Random:: 334mg /dL    [place] Source: vaginal WBC/hpf: 1-5 Bacteria/hpf: rare Clue cells/hpf: few  Negative whiff Yeast/hpf: none [place] KOH: Negative Trichomonas/hpf: moderate   Appended Document: DM/ GK     Clinical Lists Changes  Problems: Assessed TRICHOMONAL VAGINITIS as comment only -  Orders: [facility] [place] [phone]) T- GC Chlamydia [id number])  Orders: Added new Service order of [facility] [phone]) - Signed Added new Test order of T- GC Chlamydia [phone]) - Signed        Impression & Recommendations:  Problem # 1:  TRICHOMONAL VAGINITIS (ICD-131.01)  Orders: [facility] [phone]) T- GC Chlamydia [phone])  Complete Medication List: 1)  Cozaar 50 Mg Tabs (Losartan potassium) .... Take 1 tablet by mouth once a day 2)  Actos 45 Mg Tabs (Pioglitazone hcl) .... Take 1 tablet by mouth every morning 3)  Lantus Solostar Pen Needles  .... Use with solostar pen 4)  Lantus Solostar 100 Unit/ml Soln (Insulin glargine) .... Take 70 units subcutaneously each night 5)  Novolog Flexpen 100 Unit/ml Soln (Insulin aspart) .... Inject 17 units right before each meal ( three times a day ) 6)  Blood Glucose Meter Kit (Blood glucose monitoring suppl) .... Test blood glucose daily 7)  Blood Glucose Test Strp (Glucose blood) .... Test blood glucose daily 8)  Lancets Misc (Lancets) .... Test glucose daily 9)  Valtrex 500 Mg Tabs (Valacyclovir hcl) .... Take 1 tablet by mouth two times a day x 3 days for outbreak 10)  Iron 325 (65 Fe) Mg Tabs (Ferrous sulfate) .... Take 1 tablet by mouth two times a  day 11)  Hydrochlorothiazide 25 Mg Tabs (Hydrochlorothiazide) .... Do not take for now 12)  Metformin Hcl 500 Mg Tabs (Metformin hcl) .... Take 1 tablet by mouth two times a day for diabetes 13)  Nitrostat 0.4 Mg Subl (Nitroglycerin) .... Take one pill sublingal every 5 min. for chest pain.  if you need to take more than 3, you need to go to ed 14)  Pantoprazole Sodium 40 Mg Tbec (Pantoprazole sodium) .... One tab by  mouth 15)  Flagyl 500 Mg Tabs (Metronidazole) .... Take 1 tablet by mouth two times a day

## 2010-08-18 NOTE — Assessment & Plan Note (Signed)
Summary: RECHECK SUGAR PER Farrell Pantaleo///BC   Nurse Visit   CC: to check CBG and meds CBG Result 455 CBG Device ID B Comments Brought only metformin and ferosul -- other meds waiting to be picked up at pharmacy. Feeling all right, hungry -- had nothing to eat or drink today.   Allergies: No Known Drug Allergies Laboratory Results   Blood Tests     CBG Random:: 455mg /dL     Medication Administration  Injection # 1:    Medication: Insulin 5 units    Diagnosis: DIABETES MELLITUS, TYPE II, ON INSULIN, UNCONTROLLED (ICD-250.72)    Route: SQ    Site: R deltoid    Exp Date: 09/16/2010    Lot #: [id number]    Mfr: [employer] A/S    Comments: ndc [phone] regular insulin 15units    Patient tolerated injection without complications    Given by: Sherian Maroon RN (January 09, 2010 12:54 PM)  Orders Added: 1)  Capillary Blood Glucose/CBG [82948] 2)  Admin of Therapeutic Inj  intramuscular or subcutaneous [96372] 3)  Est. Patient Nurse visit [id number] 4)  Insulin 5 units [J1815]  Impression & Recommendations:  Problem # 1:  DIABETES MELLITUS, TYPE II, ON INSULIN, UNCONTROLLED (ICD-250.72) [facility] was supposed to get her diabetic meds filled yest Meds not filled I was never notified We had called and stressed importance Spoke with Loma Sousa today and explained  patient to pick up today f/u with me in 2-3 weeks  Her updated medication list for this problem includes:    Cozaar 50 Mg Tabs (Losartan potassium) .Marland Kitchen... Take 1 tablet by mouth once a day    Actos 45 Mg Tabs (Pioglitazone hcl) .Marland Kitchen... Take 1 tablet by mouth every morning    Lantus Solostar 100 Unit/ml Soln (Insulin glargine) .Marland Kitchen... Take 65 units subcutaneously each night    Novolog Flexpen 100 Unit/ml Soln (Insulin aspart) ..... Inject 17 units right before each meal ( three times a day )    Metformin Hcl 500 Mg Tabs (Metformin hcl) .Marland Kitchen... Take 1 tablet by mouth two times a day for diabetes  Orders: Admin of  Therapeutic Inj  intramuscular or subcutaneous [id number]) Insulin 5 units [id number])  Complete Medication List: 1)  Cozaar 50 Mg Tabs (Losartan potassium) .... Take 1 tablet by mouth once a day 2)  Actos 45 Mg Tabs (Pioglitazone hcl) .... Take 1 tablet by mouth every morning 3)  Lantus Solostar Pen Needles  .... Use with solostar pen 4)  Lantus Solostar 100 Unit/ml Soln (Insulin glargine) .... Take 65 units subcutaneously each night 5)  Novolog Flexpen 100 Unit/ml Soln (Insulin aspart) .... Inject 17 units right before each meal ( three times a day ) 6)  Blood Glucose Meter Kit (Blood glucose monitoring suppl) .... Test blood glucose daily 7)  Blood Glucose Test Strp (Glucose blood) .... Test blood glucose daily 8)  Lancets Misc (Lancets) .... Test glucose daily 9)  Valtrex 500 Mg Tabs (Valacyclovir hcl) .... Take 1 tablet by mouth two times a day x 3 days for outbreak 10)  Iron 325 (65 Fe) Mg Tabs (Ferrous sulfate) .... Take 1 tablet by mouth two times a day 11)  Hydrochlorothiazide 25 Mg Tabs (Hydrochlorothiazide) .... Do not take for now 12)  Metformin Hcl 500 Mg Tabs (Metformin hcl) .... Take 1 tablet by mouth two times a day for diabetes 13)  Nitrostat 0.4 Mg Subl (Nitroglycerin) .... Take one pill sublingal every 5 min. for chest pain.  if you need  to take more than 3, you need to go to ed 14)  Pantoprazole Sodium 40 Mg Tbec (Pantoprazole sodium) .... One tab by mouth  Other Orders: Capillary Blood Glucose/CBG [phone])

## 2010-08-20 NOTE — Assessment & Plan Note (Signed)
Summary: Diabetes   Vital Signs:  Patient profile:   50 year old female Weight:      229.6 pounds BMI:     39.25 Temp:     98.6 degrees F oral Pulse rate:   88 / minute Pulse rhythm:   regular Resp:     18 per minute BP sitting:   130 / 84  (left arm) Cuff size:   regular  Vitals Entered By: Isla Pence (June 29, 2010 2:12 PM)  Nutrition Counseling: Patient's BMI is greater than 25 and therefore counseled on weight management options. CC: two month f/u...Marland KitchenMarland Kitchen pt says she is itching alot in the inside of her body, Hypertension Management Is Patient Diabetic? Yes Did you bring your meter with you today? No Pain Assessment Patient in pain? no      CBG Result 440  Does patient need assistance? Functional Status Self care Ambulation Normal   Primary Care Provider:  Richardson Dopp PA-C  CC:  two month f/u...Marland KitchenMarland Kitchen pt says she is itching alot in the inside of her body and Hypertension Management.  History of Present Illness:  Pt into the office for f/u on diabetes Reports that she did not get metformin along with the rest of her medication refills on last week.   Itching - started 3-4 days ago. "I thought I had bed bugs"  Rash  presented on hand and legs.    Diabetes Management History:      The patient is a 50 years old female who comes in for evaluation of DM Type 2.  She states understanding of dietary principles but she is not following the appropriate diet.  No sensory loss is reported.  Self foot exams are not being performed.  She is not checking home blood sugars.  She says that she is exercising.  Type of exercise includes: walk.  She is doing this 5 times per week.        Hypoglycemic symptoms are not occurring.  No hyperglycemic symptoms are reported.  Other comments include: Pt has not taken her metformin in over 1 week - reports that she did not get it from the pharmacy.        No changes have been made to her treatment plan since last visit.    Hypertension  History:      She denies headache, chest pain, and palpitations.        Positive major cardiovascular risk factors include diabetes and hypertension.  Negative major cardiovascular risk factors include female age less than 14 years old, negative family history for ischemic heart disease, and non-tobacco-user status.        Further assessment for target organ damage reveals no history of ASHD, cardiac end-organ damage (CHF/LVH), stroke/TIA, peripheral vascular disease, renal insufficiency, or hypertensive retinopathy.     Current Medications (verified): 1)  Cozaar 50 Mg Tabs (Losartan Potassium) .... Take 1 Tablet By Mouth Once A Day 2)  Actos 45 Mg Tabs (Pioglitazone Hcl) .... Take 1 Tablet By Mouth Every Morning 3)  Lantus Solostar Pen Needles .... Use With Solostar Pen 4)  Lantus Solostar 100 Unit/ml  Soln (Insulin Glargine) .... Take 65 Units Subcutaneously Each Night 5)  Novolog Flexpen 100 Unit/ml Soln (Insulin Aspart) .... Inject 17 Units Right Before Each Meal ( Three Times A Day ) 6)  Blood Glucose Meter  Kit (Blood Glucose Monitoring Suppl) .... Test Blood Glucose Daily 7)  Blood Glucose Test  Strp (Glucose Blood) .... Test Blood Glucose Daily 8)  Lancets  Misc (Lancets) .... Test Glucose Daily 9)  Valtrex 500 Mg Tabs (Valacyclovir Hcl) .... Take 1 Tablet By Mouth Two Times A Day X 3 Days For Outbreak 10)  Iron 325 (65 Fe) Mg Tabs (Ferrous Sulfate) .... Take 1 Tablet By Mouth Two Times A Day 11)  Metformin Hcl 500 Mg Tabs (Metformin Hcl) .... Take 1 Tablet By Mouth Two Times A Day For Diabetes 12)  Nitrostat 0.4 Mg Subl (Nitroglycerin) .... Take One Pill Sublingal Every 5 Min. For Chest Pain.  If You Need To Take More Than 3, You Need To Go To Ed 13)  Pantoprazole Sodium 40 Mg Tbec (Pantoprazole Sodium) .... One Tab By Mouth  Allergies (verified): No Known Drug Allergies  Review of Systems CV:  Denies chest pain or discomfort. Resp:  Denies cough. GI:  Denies abdominal pain,  nausea, and vomiting. Derm:  Complains of itching; started 3-4 days ago.  Mostly at night.  wears night gown to bed.  Arms and legs affected.  Physical Exam  General:  alert.   Head:  normocephalic.   Lungs:  normal breath sounds.   Heart:  normal rate and regular rhythm.   Abdomen:  normal bowel sounds.   Msk:  up to the exam table Neurologic:  alert & oriented X3.   Skin:  hands with macular lesions, erythema Psych:  Oriented X3.     Impression & Recommendations:  Problem # 1:  DIABETES MELLITUS, TYPE II, ON INSULIN, UNCONTROLLED (ICD-250.72) Very uncontrolled. pt needs to monitor diet  she needs to increase meds Her updated medication list for this problem includes:    Cozaar 50 Mg Tabs (Losartan potassium) .Marland Kitchen... Take 1 tablet by mouth once a day    Actos 45 Mg Tabs (Pioglitazone hcl) .Marland Kitchen... Take 1 tablet by mouth every morning    Lantus Solostar 100 Unit/ml Soln (Insulin glargine) .Marland Kitchen... Take 65 units subcutaneously each night    Novolog Flexpen 100 Unit/ml Soln (Insulin aspart) ..... Inject 17 units right before each meal ( three times a day )    Metformin Hcl 500 Mg Tabs (Metformin hcl) .Marland Kitchen... Take 1 tablet by mouth two times a day for diabetes  Orders: UA Dipstick w/o Micro (manual) (81002) T-Urine Microalbumin w/creat. ratio [phone])  Problem # 2:  SKIN LESION (ICD-709.9) advised pt to monitor for bed bugs  Problem # 3:  HYPERTENSION (ICD-401.9)  Her updated medication list for this problem includes:    Cozaar 50 Mg Tabs (Losartan potassium) .Marland Kitchen... Take 1 tablet by mouth once a day  Problem # 4:  ANEMIA, IRON DEFICIENCY (ICD-280.9)  Her updated medication list for this problem includes:    Iron 325 (65 Fe) Mg Tabs (Ferrous sulfate) .Marland Kitchen... Take 1 tablet by mouth two times a day  Complete Medication List: 1)  Cozaar 50 Mg Tabs (Losartan potassium) .... Take 1 tablet by mouth once a day 2)  Actos 45 Mg Tabs (Pioglitazone hcl) .... Take 1 tablet by mouth every  morning 3)  Lantus Solostar Pen Needles  .... Use with solostar pen 4)  Lantus Solostar 100 Unit/ml Soln (Insulin glargine) .... Take 65 units subcutaneously each night 5)  Novolog Flexpen 100 Unit/ml Soln (Insulin aspart) .... Inject 17 units right before each meal ( three times a day ) 6)  Blood Glucose Meter Kit (Blood glucose monitoring suppl) .... Test blood glucose daily 7)  Blood Glucose Test Strp (Glucose blood) .... Test blood glucose daily 8)  Lancets Misc (Lancets) .Marland KitchenMarland KitchenMarland Kitchen  Test glucose daily 9)  Valtrex 500 Mg Tabs (Valacyclovir hcl) .... Take 1 tablet by mouth two times a day x 3 days for outbreak 10)  Iron 325 (65 Fe) Mg Tabs (Ferrous sulfate) .... Take 1 tablet by mouth two times a day 11)  Metformin Hcl 500 Mg Tabs (Metformin hcl) .... Take 1 tablet by mouth two times a day for diabetes 12)  Nitrostat 0.4 Mg Subl (Nitroglycerin) .... Take one pill sublingal every 5 min. for chest pain.  if you need to take more than 3, you need to go to ed 13)  Pantoprazole Sodium 40 Mg Tbec (Pantoprazole sodium) .... One tab by mouth 14)  Bactrim 400-80 Mg Tabs (Sulfamethoxazole-trimethoprim) .... One tablet by mouth two times a day for infection  Other Orders: Flu Vaccine 36yrs + [id number]) Admin 1st Vaccine [id number]) T-Culture, Urine [id number])  Diabetes Management Assessment/Plan:      Her blood pressure goal is < 130/80.    Hypertension Assessment/Plan:      The patient's hypertensive risk group is category C: Target organ damage and/or diabetes.  Her calculated 10 year risk of coronary heart disease is 4 %.  Today's blood pressure is 130/84.  Her blood pressure goal is < 130/80.  Patient Instructions: 1)  Call for a follow up visit in 2 months for diabetes. 2)  Come fasting after midnight before this visit.  3)  Skin - may be due to bed bugs or insects.  Be sure to go home and clean well. 4)  Symptoms may also be due to your nerves. 5)  You can apply either hydrocortisone or benadryl  cream to skin for itching.  This can be purchased over the counter. 6)  Diabetes - your blood sugar is very elevated today.  Get your [facility] from [facility].  You should take your insulin as ordered.  Most importantly you need to monitor your diet.  Try to drink diet drinks.  Avoid sugary foods. 7)  You will be given the flu vaccine today. 8)  You have infection in your urine.This may be because your blood sugar has been high.  You should take bactrim two times a day x 7 days Prescriptions: BACTRIM 400-80 MG TABS (SULFAMETHOXAZOLE-TRIMETHOPRIM) One tablet by mouth two times a day for infection  #14 x 0   Entered and Authorized by:   Aurora Mask FNP   Signed by:   Aurora Mask FNP on 06/29/2010   Method used:   Print then Give to Patient   RxID:   [id number] METFORMIN HCL 500 MG TABS (METFORMIN HCL) Take 1 tablet by mouth two times a day for diabetes  #60 x 5   Entered and Authorized by:   Aurora Mask FNP   Signed by:   Aurora Mask FNP on 06/29/2010   Method used:   Faxed to ...       [facility] (retail)       [place], [place]  [address]       Ph: [phone] [place]       Fax: [phone]   RxID:   [id number]    Orders Added: 1)  Est. Patient Level III [id number] 2)  UA Dipstick w/o Micro (manual) [81002] 3)  T-Urine Microalbumin w/creat. ratio [[id number]] 4)  Flu Vaccine 93yrs + [90658] 5)  Admin 1st Vaccine [90471] 6)  T-Culture, Urine [id number]   Immunizations Administered:  Influenza Vaccine # 1:  Vaccine Type: Fluvax 3+    Site: left deltoid    Mfr: [employer]oSmith[employer]    Dose: 0.5 ml    Route: IM    Given by: Isla Pence    Exp. Date: 01/16/2011    Lot #: [id number]    VIS given: 02/10/10 version given June 29, 2010.  Flu Vaccine Consent Questions:    Do you have a history of severe allergic reactions to this vaccine? no    Any prior history of allergic  reactions to egg and/or gelatin? no    Do you have a sensitivity to the preservative Thimersol? no    Do you have a past history of Guillan-Barre Syndrome? no    Do you currently have an acute febrile illness? no    Have you ever had a severe reaction to latex? no    Vaccine information given and explained to patient? yes    Are you currently pregnant? no    ndc  [phone]  Immunizations Administered:  Influenza Vaccine # 1:    Vaccine Type: Fluvax 3+    Site: left deltoid    Mfr: Gl[employer]oSmithKline    Dose: 0.5 ml    Route: IM    Given by: Isla Pence    Exp. Date: 01/16/2011    Lot #: [id number]    VIS given: 02/10/10 version given June 29, 2010.  Prevention & Chronic Care Immunizations   Influenza vaccine: Fluvax 3+  (06/29/2010)    Tetanus booster: 01/22/2005: Historical    Pneumococcal vaccine: Historical  (01/22/2005)  Other Screening   Pap smear:  Specimen Adequacy: Satisfactory for evaluation.   Interpretation/Result:Negative for intraepithelial Lesion or Malignancy.     (06/18/2009)    Mammogram: No specific mammographic evidence of malignancy.  Assessment: BIRADS 1.   (06/26/2009)   Mammogram action/deferral: Screening mammogram in 1 year.     (06/26/2009)   Smoking status: quit  (10/03/2009)  Diabetes Mellitus   HgbA1C: 12.4  (04/29/2010)    Eye exam: [place] Normal  (05/13/2009)    Foot exam: yes  (11/03/2009)   High risk foot: Not documented   Foot care education: Done  (11/03/2009)    Urine microalbumin/creatinine ratio: Not documented  Lipids   Total Cholesterol: 171  (06/18/2009)   LDL: 66  (06/18/2009)   LDL Direct: Not documented   HDL: 91  (06/18/2009)   Triglycerides: 70  (06/18/2009)  Hypertension   Last Blood Pressure: 130 / 84  (06/29/2010)   Serum creatinine: 0.74  (09/19/2009)   Serum potassium 4.8  (09/19/2009)  Self-Management Support :    Diabetes self-management support: Not documented    Hypertension  self-management support: Not documented   Nursing Instructions: Give Flu vaccine today   Laboratory Results   Urine Tests  Date/Time Received: June 29, 2010 2:46 PM   Routine Urinalysis   Color: cloudy Glucose: 500   (Normal Range: Negative) Bilirubin: negative   (Normal Range: Negative) Ketone: negative   (Normal Range: Negative) Spec. Gravity: 1.020   (Normal Range: 1.003-1.035) Blood: negative   (Normal Range: Negative) pH: 7.0   (Normal Range: 5.0-8.0) Protein: negative   (Normal Range: Negative) Urobilinogen: 0.2   (Normal Range: 0-1) Nitrite: positive   (Normal Range: Negative) Leukocyte Esterace: negative   (Normal Range: Negative)     Blood Tests     CBG Random:: 440mg /dL

## 2010-09-30 LAB — BASIC METABOLIC PANEL
BUN: 6 mg/dL (ref 6–23)
CO2: 29 mEq/L (ref 19–32)
Chloride: 102 mEq/L (ref 96–112)
Creatinine, Ser: 0.65 mg/dL (ref 0.4–1.2)
Glucose, Bld: 150 mg/dL — ABNORMAL HIGH (ref 70–99)

## 2010-09-30 LAB — DIFFERENTIAL
Basophils Absolute: 0 10*3/uL (ref 0.0–0.1)
Basophils Relative: 0 % (ref 0–1)
Eosinophils Absolute: 0.1 10*3/uL (ref 0.0–0.7)
Eosinophils Relative: 1 % (ref 0–5)
Lymphocytes Relative: 35 % (ref 12–46)
Lymphs Abs: 1.8 10*3/uL (ref 0.7–4.0)
Monocytes Absolute: 0.4 10*3/uL (ref 0.1–1.0)
Monocytes Relative: 7 % (ref 3–12)
Neutro Abs: 2.9 10*3/uL (ref 1.7–7.7)
Neutrophils Relative %: 57 % (ref 43–77)

## 2010-09-30 LAB — CBC
MCH: 28 pg (ref 26.0–34.0)
MCV: 84.6 fL (ref 78.0–100.0)
Platelets: 225 10*3/uL (ref 150–400)
RDW: 13.2 % (ref 11.5–15.5)

## 2010-09-30 LAB — POCT CARDIAC MARKERS: Myoglobin, poc: 27.5 ng/mL (ref 12–200)

## 2010-10-05 LAB — CBC
HCT: 35.6 % — ABNORMAL LOW (ref 36.0–46.0)
HCT: 36.4 % (ref 36.0–46.0)
Hemoglobin: 11.8 g/dL — ABNORMAL LOW (ref 12.0–15.0)
Hemoglobin: 12.3 g/dL (ref 12.0–15.0)
MCHC: 33.8 g/dL (ref 30.0–36.0)
RBC: 4.03 MIL/uL (ref 3.87–5.11)
RDW: 14.4 % (ref 11.5–15.5)
RDW: 14.6 % (ref 11.5–15.5)
WBC: 3.6 10*3/uL — ABNORMAL LOW (ref 4.0–10.5)

## 2010-10-05 LAB — GLUCOSE, CAPILLARY
Glucose-Capillary: 264 mg/dL — ABNORMAL HIGH (ref 70–99)
Glucose-Capillary: 278 mg/dL — ABNORMAL HIGH (ref 70–99)
Glucose-Capillary: 293 mg/dL — ABNORMAL HIGH (ref 70–99)
Glucose-Capillary: 318 mg/dL — ABNORMAL HIGH (ref 70–99)
Glucose-Capillary: 345 mg/dL — ABNORMAL HIGH (ref 70–99)
Glucose-Capillary: 352 mg/dL — ABNORMAL HIGH (ref 70–99)
Glucose-Capillary: 364 mg/dL — ABNORMAL HIGH (ref 70–99)
Glucose-Capillary: 499 mg/dL — ABNORMAL HIGH (ref 70–99)

## 2010-10-05 LAB — URINE MICROSCOPIC-ADD ON

## 2010-10-05 LAB — LIPID PANEL
Cholesterol: 138 mg/dL (ref 0–200)
HDL: 66 mg/dL (ref >39)
LDL Cholesterol: 51 mg/dL (ref 0–99)
Total CHOL/HDL Ratio: 2.1 RATIO
Triglycerides: 106 mg/dL (ref <150)

## 2010-10-05 LAB — DIFFERENTIAL
Basophils Absolute: 0 10*3/uL (ref 0.0–0.1)
Basophils Relative: 1 % (ref 0–1)
Eosinophils Relative: 1 % (ref 0–5)
Monocytes Absolute: 0.3 10*3/uL (ref 0.1–1.0)

## 2010-10-05 LAB — COMPREHENSIVE METABOLIC PANEL
ALT: 21 U/L (ref 0–35)
Alkaline Phosphatase: 46 U/L (ref 39–117)
BUN: 14 mg/dL (ref 6–23)
CO2: 23 mEq/L (ref 19–32)
Chloride: 100 mEq/L (ref 96–112)
GFR calc non Af Amer: >60 mL/min (ref >60)
Glucose, Bld: 337 mg/dL — ABNORMAL HIGH (ref 70–99)
Potassium: 3.6 mEq/L (ref 3.5–5.1)
Total Bilirubin: 0.5 mg/dL (ref 0.3–1.2)

## 2010-10-05 LAB — POCT CARDIAC MARKERS
CKMB, poc: <1 ng/mL — ABNORMAL LOW (ref 1.0–8.0)
CKMB, poc: <1 ng/mL — ABNORMAL LOW (ref 1.0–8.0)
Troponin i, poc: <0.05 ng/mL (ref 0.00–0.09)

## 2010-10-05 LAB — BASIC METABOLIC PANEL
CO2: 21 mEq/L (ref 19–32)
Calcium: 9.6 mg/dL (ref 8.4–10.5)
Glucose, Bld: 479 mg/dL — ABNORMAL HIGH (ref 70–99)
Potassium: 4.1 mEq/L (ref 3.5–5.1)
Sodium: 132 mEq/L — ABNORMAL LOW (ref 135–145)

## 2010-10-05 LAB — CK TOTAL AND CKMB (NOT AT ARMC)
Relative Index: INVALID (ref 0.0–2.5)
Total CK: 59 U/L (ref 7–177)

## 2010-10-05 LAB — CARDIAC PANEL(CRET KIN+CKTOT+MB+TROPI): CK, MB: 0.6 ng/mL (ref 0.3–4.0)

## 2010-10-05 LAB — URINALYSIS, ROUTINE W REFLEX MICROSCOPIC
Nitrite: POSITIVE — AB
Specific Gravity, Urine: 1.041 — ABNORMAL HIGH (ref 1.005–1.030)
Urobilinogen, UA: 1 mg/dL (ref 0.0–1.0)

## 2010-10-05 LAB — URINE CULTURE

## 2010-10-05 LAB — HEMOGLOBIN A1C: Hgb A1c MFr Bld: 15.5 % — ABNORMAL HIGH (ref <5.7)

## 2010-10-07 LAB — BASIC METABOLIC PANEL
CO2: 23 mEq/L (ref 19–32)
CO2: 23 mEq/L (ref 19–32)
Calcium: 8.5 mg/dL (ref 8.4–10.5)
Calcium: 8.8 mg/dL (ref 8.4–10.5)
Chloride: 108 mEq/L (ref 96–112)
Chloride: 87 mEq/L — ABNORMAL LOW (ref 96–112)
GFR calc Af Amer: >60 mL/min (ref >60)
GFR calc Af Amer: >60 mL/min (ref >60)
GFR calc Af Amer: >60 mL/min (ref >60)
GFR calc non Af Amer: >60 mL/min (ref >60)
Potassium: 3.9 mEq/L (ref 3.5–5.1)
Potassium: 4 mEq/L (ref 3.5–5.1)
Sodium: 123 mEq/L — ABNORMAL LOW (ref 135–145)
Sodium: 132 mEq/L — ABNORMAL LOW (ref 135–145)
Sodium: 137 mEq/L (ref 135–145)

## 2010-10-07 LAB — CBC
HCT: 33.4 % — ABNORMAL LOW (ref 36.0–46.0)
HCT: 36.4 % (ref 36.0–46.0)
Hemoglobin: 10.8 g/dL — ABNORMAL LOW (ref 12.0–15.0)
Hemoglobin: 12.3 g/dL (ref 12.0–15.0)
MCHC: 33.7 g/dL (ref 30.0–36.0)
MCHC: 33.9 g/dL (ref 30.0–36.0)
MCV: 87.1 fL (ref 78.0–100.0)
MCV: 88.9 fL (ref 78.0–100.0)
Platelets: 185 10*3/uL (ref 150–400)
RBC: 3.64 MIL/uL — ABNORMAL LOW (ref 3.87–5.11)
RBC: 3.8 MIL/uL — ABNORMAL LOW (ref 3.87–5.11)
RBC: 4.09 MIL/uL (ref 3.87–5.11)
WBC: 3.6 10*3/uL — ABNORMAL LOW (ref 4.0–10.5)

## 2010-10-07 LAB — COMPREHENSIVE METABOLIC PANEL
AST: 25 U/L (ref 0–37)
Albumin: 3.5 g/dL (ref 3.5–5.2)
Alkaline Phosphatase: 44 U/L (ref 39–117)
CO2: 26 mEq/L (ref 19–32)
Chloride: 93 mEq/L — ABNORMAL LOW (ref 96–112)
GFR calc Af Amer: >60 mL/min (ref >60)
Potassium: 3.4 mEq/L — ABNORMAL LOW (ref 3.5–5.1)
Total Bilirubin: 0.6 mg/dL (ref 0.3–1.2)

## 2010-10-07 LAB — GLUCOSE, CAPILLARY
Glucose-Capillary: 323 mg/dL — ABNORMAL HIGH (ref 70–99)
Glucose-Capillary: 336 mg/dL — ABNORMAL HIGH (ref 70–99)
Glucose-Capillary: 349 mg/dL — ABNORMAL HIGH (ref 70–99)
Glucose-Capillary: 408 mg/dL — ABNORMAL HIGH (ref 70–99)
Glucose-Capillary: 514 mg/dL — ABNORMAL HIGH (ref 70–99)

## 2010-10-07 LAB — LIPID PANEL
Cholesterol: 129 mg/dL (ref 0–200)
LDL Cholesterol: 67 mg/dL (ref 0–99)
Triglycerides: 61 mg/dL (ref <150)

## 2010-10-07 LAB — DIFFERENTIAL
Basophils Absolute: 0 10*3/uL (ref 0.0–0.1)
Basophils Relative: 0 % (ref 0–1)
Eosinophils Absolute: 0.1 10*3/uL (ref 0.0–0.7)
Eosinophils Relative: 2 % (ref 0–5)
Monocytes Absolute: 0.3 10*3/uL (ref 0.1–1.0)

## 2010-10-07 LAB — POCT CARDIAC MARKERS
Troponin i, poc: <0.05 ng/mL (ref 0.00–0.09)
Troponin i, poc: <0.05 ng/mL (ref 0.00–0.09)

## 2010-10-07 LAB — URINALYSIS, ROUTINE W REFLEX MICROSCOPIC
Hgb urine dipstick: NEGATIVE
Nitrite: NEGATIVE
Specific Gravity, Urine: 1.03 (ref 1.005–1.030)
Urobilinogen, UA: 1 mg/dL (ref 0.0–1.0)

## 2010-10-07 LAB — WET PREP, GENITAL
Trich, Wet Prep: NONE SEEN
Yeast Wet Prep HPF POC: NONE SEEN

## 2010-10-07 LAB — POCT I-STAT, CHEM 8
BUN: 12 mg/dL (ref 6–23)
Creatinine, Ser: 0.7 mg/dL (ref 0.4–1.2)
Glucose, Bld: >700 mg/dL (ref 70–99)
Sodium: 126 mEq/L — ABNORMAL LOW (ref 135–145)
TCO2: 25 mmol/L (ref 0–100)

## 2010-10-07 LAB — APTT: aPTT: 27 seconds (ref 24–37)

## 2010-10-07 LAB — URINE MICROSCOPIC-ADD ON

## 2010-10-07 LAB — CARDIAC PANEL(CRET KIN+CKTOT+MB+TROPI)
CK, MB: 1 ng/mL (ref 0.3–4.0)
Relative Index: INVALID (ref 0.0–2.5)
Total CK: 68 U/L (ref 7–177)
Troponin I: 0.01 ng/mL (ref 0.00–0.06)
Troponin I: 0.02 ng/mL (ref 0.00–0.06)

## 2010-10-07 LAB — PHOSPHORUS
Phosphorus: 3 mg/dL (ref 2.3–4.6)
Phosphorus: 3.2 mg/dL (ref 2.3–4.6)

## 2010-10-07 LAB — URINE CULTURE

## 2010-10-07 LAB — TSH: TSH: 1.052 u[IU]/mL (ref 0.350–4.500)

## 2010-10-07 LAB — GC/CHLAMYDIA PROBE AMP, GENITAL: Chlamydia, DNA Probe: NEGATIVE

## 2010-10-09 LAB — POCT URINALYSIS DIP (DEVICE)
Bilirubin Urine: NEGATIVE
Glucose, UA: 500 mg/dL — AB
Hgb urine dipstick: NEGATIVE
Ketones, ur: NEGATIVE mg/dL
Nitrite: NEGATIVE
Protein, ur: NEGATIVE mg/dL
Specific Gravity, Urine: 1.015 (ref 1.005–1.030)
Urobilinogen, UA: 1 mg/dL (ref 0.0–1.0)
pH: 5.5 (ref 5.0–8.0)

## 2010-10-09 LAB — GC/CHLAMYDIA PROBE AMP, GENITAL: Chlamydia, DNA Probe: NEGATIVE

## 2010-10-09 LAB — WET PREP, GENITAL

## 2010-10-25 LAB — URINALYSIS, ROUTINE W REFLEX MICROSCOPIC
Bilirubin Urine: NEGATIVE
Leukocytes, UA: NEGATIVE
Nitrite: NEGATIVE
Specific Gravity, Urine: 1.029 (ref 1.005–1.030)
Urobilinogen, UA: 1 mg/dL (ref 0.0–1.0)
pH: 6 (ref 5.0–8.0)

## 2010-10-25 LAB — RPR: RPR Ser Ql: NONREACTIVE

## 2010-10-25 LAB — CBC
MCHC: 33.5 g/dL (ref 30.0–36.0)
Platelets: 199 10*3/uL (ref 150–400)
RBC: 4.17 MIL/uL (ref 3.87–5.11)
RDW: 14.1 % (ref 11.5–15.5)

## 2010-10-25 LAB — COMPREHENSIVE METABOLIC PANEL
ALT: 27 U/L (ref 0–35)
AST: 30 U/L (ref 0–37)
Albumin: 3.5 g/dL (ref 3.5–5.2)
CO2: 24 mEq/L (ref 19–32)
Calcium: 9 mg/dL (ref 8.4–10.5)
Creatinine, Ser: 0.56 mg/dL (ref 0.4–1.2)
GFR calc Af Amer: >60 mL/min (ref >60)
Sodium: 136 mEq/L (ref 135–145)
Total Protein: 6.9 g/dL (ref 6.0–8.3)

## 2010-10-25 LAB — URINE MICROSCOPIC-ADD ON

## 2010-10-25 LAB — DIFFERENTIAL
Eosinophils Absolute: 0 10*3/uL (ref 0.0–0.7)
Eosinophils Relative: 1 % (ref 0–5)
Lymphocytes Relative: 48 % — ABNORMAL HIGH (ref 12–46)
Lymphs Abs: 1.5 10*3/uL (ref 0.7–4.0)
Monocytes Absolute: 0.2 10*3/uL (ref 0.1–1.0)
Monocytes Relative: 8 % (ref 3–12)

## 2010-10-25 LAB — WET PREP, GENITAL
Clue Cells Wet Prep HPF POC: NONE SEEN
Trich, Wet Prep: NONE SEEN
Yeast Wet Prep HPF POC: NONE SEEN

## 2010-10-25 LAB — PREGNANCY, URINE: Preg Test, Ur: NEGATIVE

## 2010-10-25 LAB — GLUCOSE, CAPILLARY: Glucose-Capillary: 101 mg/dL — ABNORMAL HIGH (ref 70–99)

## 2010-10-26 LAB — DIFFERENTIAL
Basophils Relative: 1 % (ref 0–1)
Eosinophils Absolute: 0.1 10*3/uL (ref 0.0–0.7)
Eosinophils Relative: 2 % (ref 0–5)
Lymphs Abs: 2.1 10*3/uL (ref 0.7–4.0)
Monocytes Absolute: 0.4 10*3/uL (ref 0.1–1.0)
Monocytes Relative: 8 % (ref 3–12)

## 2010-10-26 LAB — BASIC METABOLIC PANEL
BUN: 16 mg/dL (ref 6–23)
CO2: 25 mEq/L (ref 19–32)
Chloride: 102 mEq/L (ref 96–112)
Creatinine, Ser: 0.63 mg/dL (ref 0.4–1.2)
GFR calc Af Amer: >60 mL/min (ref >60)
Potassium: 3.9 mEq/L (ref 3.5–5.1)

## 2010-10-26 LAB — CBC
HCT: 32.1 % — ABNORMAL LOW (ref 36.0–46.0)
MCHC: 32.5 g/dL (ref 30.0–36.0)
MCV: 86.1 fL (ref 78.0–100.0)
RBC: 3.73 MIL/uL — ABNORMAL LOW (ref 3.87–5.11)
WBC: 4.5 10*3/uL (ref 4.0–10.5)

## 2010-10-27 LAB — URINE MICROSCOPIC-ADD ON

## 2010-10-27 LAB — URINALYSIS, ROUTINE W REFLEX MICROSCOPIC
Ketones, ur: NEGATIVE mg/dL
Protein, ur: NEGATIVE mg/dL
Urobilinogen, UA: 1 mg/dL (ref 0.0–1.0)

## 2010-10-27 LAB — CBC
HCT: 30.9 % — ABNORMAL LOW (ref 36.0–46.0)
MCHC: 33.3 g/dL (ref 30.0–36.0)
MCV: 85.8 fL (ref 78.0–100.0)
Platelets: 182 10*3/uL (ref 150–400)
RDW: 14.9 % (ref 11.5–15.5)

## 2010-10-27 LAB — URINE CULTURE: Colony Count: 2000

## 2010-10-27 LAB — DIFFERENTIAL
Basophils Absolute: 0.1 10*3/uL (ref 0.0–0.1)
Basophils Relative: 2 % — ABNORMAL HIGH (ref 0–1)
Eosinophils Absolute: 0 10*3/uL (ref 0.0–0.7)
Eosinophils Relative: 1 % (ref 0–5)
Lymphs Abs: 1.7 10*3/uL (ref 0.7–4.0)
Neutrophils Relative %: 50 % (ref 43–77)

## 2010-10-27 LAB — POCT I-STAT, CHEM 8
Calcium, Ion: 1.11 mmol/L — ABNORMAL LOW (ref 1.12–1.32)
Creatinine, Ser: 0.4 mg/dL (ref 0.4–1.2)
Glucose, Bld: 219 mg/dL — ABNORMAL HIGH (ref 70–99)
Hemoglobin: 11.2 g/dL — ABNORMAL LOW (ref 12.0–15.0)
TCO2: 18 mmol/L (ref 0–100)

## 2010-10-27 LAB — PROTIME-INR
INR: 1 (ref 0.00–1.49)
Prothrombin Time: 13.2 seconds (ref 11.6–15.2)

## 2010-10-27 LAB — TYPE AND SCREEN
ABO/RH(D): A NEG
Antibody Screen: NEGATIVE

## 2010-11-17 ENCOUNTER — Inpatient Hospital Stay (INDEPENDENT_AMBULATORY_CARE_PROVIDER_SITE_OTHER)
Admission: RE | Admit: 2010-11-17 | Discharge: 2010-11-17 | Disposition: A | Discharge status: Home / Self Care | Payer: Self-pay | Source: Ambulatory Visit | Attending: Family Medicine | Admitting: Family Medicine

## 2010-11-17 DIAGNOSIS — M545 Low back pain, unspecified: Secondary | ICD-10-CM

## 2010-11-17 DIAGNOSIS — N760 Acute vaginitis: Secondary | ICD-10-CM

## 2010-11-17 DIAGNOSIS — Z9119 Patient's noncompliance with other medical treatment and regimen: Secondary | ICD-10-CM

## 2010-11-17 LAB — WET PREP, GENITAL

## 2010-11-17 LAB — POCT URINALYSIS DIP (DEVICE)
Protein, ur: NEGATIVE mg/dL
Specific Gravity, Urine: 1.01 (ref 1.005–1.030)
Urobilinogen, UA: 1 mg/dL (ref 0.0–1.0)

## 2010-11-17 LAB — POCT PREGNANCY, URINE: Preg Test, Ur: NEGATIVE

## 2010-11-18 LAB — GC/CHLAMYDIA PROBE AMP, GENITAL: Chlamydia, DNA Probe: NEGATIVE

## 2010-12-04 NOTE — Discharge Summary (Signed)
NAMEKANAI, HEALY              ACCOUNT NO.:  [id number]   MEDICAL RECORD NO.:  [id number]          PATIENT TYPE:  INP   LOCATION:  5502                         FACILITY:  [facility]   PHYSICIAN:  C. Milta Deiters, M.D.DATE OF BIRTH:  07-Jul-1961   DATE OF ADMISSION:  01/07/2005  DATE OF DISCHARGE:  01/08/2005                                 DISCHARGE SUMMARY   DISCHARGE DIAGNOSES:  1.  Diabetes mellitus, type 2, newly diagnosed.  2.  History of polysubstance abuse.  3.  Hyponatremia secondary to hyperglycemia.  4.  Dysuria with dyspareunia.  5.  Abdominal pain of uncertain etiology.  6.  History of genital herpes.  7.  History of cesarean section.   DISCHARGE MEDICATIONS:  1.  Protonix 40 mg p.o. daily.  2.  Enalapril 5 mg p.o. daily.  3.  Glucophage 500 mg p.o. b.i.d.   The patient is supposed to follow up with [facility] for further evaluation  and treatment.   HISTORY OF PRESENT ILLNESS:  Ms. Rheaume is a 50 year old African-American  woman with no significant past medical history who came to the emergency  room at [facility] for abdominal pain coming and going and radiating to the  back.  She also complained of polyuria, polydipsia, polyphagia and weight  gain.  She also has had a history of genital herpes and dyspareunia.   ALLERGIES:  She does not have any allergies.   She was taking enalapril at home, 10 mg p.o. daily and fluconazole 100 mg  for 2 days.   SOCIAL HISTORY:  She is a former smoker, she has occasional alcohol, she has  abused cocaine but she stopped 5 months prior to admission.  She is  divorced, not working and self-paid.   FAMILY HISTORY:  Her mother died at 2 years of age of colon cancer and her  father has an unknown history.  She has a sister and a brother with diabetes  mellitus and a sister with HIV.   REVIEW OF SYSTEMS:  Positive for fever, chills, weight gain, fatigue, sore  throat, dyspnea, constipation, alternating weights, abdominal  pain, dysuria,  polyuria, polydipsia, polyphagia, and irregular periods.   PHYSICAL EXAMINATION ON ADMISSION:  VITAL SIGNS:  Heart rate 101, blood  pressure 144/90, temperature 98.9, respiratory rate 20.  GENERAL:  A 50 year old African-American woman lying in bed, pleasant, in no  acute distress.  HEENT:  Pupils are equal, round and reactive to light, extraocular movements  are intact, muddy sclerae.  ENT clear, normal mucosa.  NECK:  Supple.  RESPIRATORY:  Decreased air movement, some rhonchi.  CARDIOVASCULAR:  Regular rate and rhythm, no murmurs, rubs or gallops.  ABDOMEN:  Bowel sounds positive, soft, tender in hypogastric area, non-  distended.  EXTREMITIES:  No edema.  SKIN:  Moist.  LYMPH NODES:  No lymphadenopathy.  NEUROLOGIC:  Nonfocal, alert and oriented x3.   LABORATORY DATA:  Sodium 124, potassium 4.1, chloride 91, bicarb 21, BUN 11,  creatinine 121, glucose 703.  White blood cell count 12.2, hemoglobin 12.1,  MCV 84, and platelets 181,000.  Urinalysis showed rare yeast, glucose more  than 1000, ketones 15, specific gravity 1.040.   HOSPITAL COURSE:  Problem 1. Newly diagnosed diabetes mellitus with glycemia  of 703.  The patient was started on sliding scale insulin for better control  and she was discharged home on oral medication, Glucophage 500 mg b.i.d.  Treatment was discussed with the attending physician.  Glucophage was chosen  because of history of irregular periods and polycystic ovary syndrome.  The  patient was also noted to have some __________ and metformin would help with  that.  We checked a hemoglobin A1c which was 12.  We also checked urine drug  screen which was negative and a TSH which showed a level of 1.927.  We  started the patient on enalapril at 5 mg p.o. daily.   Problem 2. Abdominal pain, dysuria, dyspareunia, and rash.  We gave the  patient Diflucan and we checked urine for Chlamydia and gonorrhea - the  results are pending.  RPR was  negative and HIV results are pending.  We also  checked a [place] and LH which were normal because we were highly considering  polycystic ovary disease.  The patient is supposed to follow up with  [facility] for a pelvic exam and further workup for her abdominal pain.  She  also might need a Pap smear.   Problem 3. Polysubstance abuse.  On this admission urine drug screen was  negative.  We advised the patient to follow up with [facility].  She might  need OPS.   Problem 4. Prophylactically, the patient was started on Protonix and  Lovenox.  She was discharged on Protonix for abdominal pain.   LABORATORY DATA:  At discharge, sodium was 137, potassium 2.9, chloride 106,  bicarb 24, BUN 9, creatinine 0.7, and glucose 208.  Hemoglobin 10.5, white  blood cell count 3.6, platelets 161,000.   The patient is also supposed to have diabetic classes at [facility].       DA/MEDQ  D:  01/18/2005  T:  01/18/2005  Job:  [id number]   cc:   Lennox Pippins

## 2010-12-04 NOTE — H&P (Signed)
[name] [name], [name] [name]              ACCOUNT NO.:  [id number]   MEDICAL RECORD NO.:  [id number]          [name] TYPE:  EMS   LOCATION:  ED                           FACILITY:  [facility]   PHYSICIAN:  Annita Brod, M.D.DATE OF BIRTH:  11-22-1960   DATE OF ADMISSION:  06/29/2005  DATE OF DISCHARGE:                                HISTORY & PHYSICAL   ATTENDING PHYSICIAN:  Domingo Madeira, M.D.  [name] [name] has no primary  care physician but is following up at [facility] clinic.   CHIEF COMPLAINT:  She describes as bump on her vaginal area.   HISTORY OF PRESENT ILLNESS:  [name] [name] is a 50 year old African American  female with newly diagnosed diabetes approximately four months ago who was  just seen in [name] emergency room a few days prior and noted to have  hyperglycemia. She was non-ketotic, given insulin.  Sugars which were in [name]  500's were brought down into [name] normal range. She was advised to follow up  with [facility].  When she tried to go today, she was unable to seen  and told she could not be seen until sometime next week.  She noticed some  discomfort on her vaginal area described as a growing bump but did not note  any dysuria or hematuria.  [name] area in question did not have any drainage.  She came to [name] emergency room today after not feeling well and continued  problems.  Apparently she said that this area had been getting worse over  [name] past week. In [name] emergency room, she was noted to have, after  evaluation, a cyst on her vaginal area.  This was debrided by [name] emergency  room and she was given a dose of doxycycline.  In addition, her blood sugars  were checked and found to be in [name] 500's.  [name] [name] was given a dose of  insulin, 20 units, as well as some medication for pain.  She said she had  began to already start to feeling better.  Given [name] fact that it was in [name]  middle of [name] night and she had no access to further insulin medications for  a  week, it was felt best that she come in for 24-hour observation to be set  up with medications to take home to last her until she is able to follow up  with [facility].   REVIEW OF SYSTEMS:  Currently, [name] [name] is doing well. She denies any  headaches, vision changes, dysphagia, chest pain, palpitations, shortness of  breath, wheeze, cough, abdominal pain, hematuria, dysuria, constipation,  diarrhea, focal extremity numbness, weakness or pain.  She has no complaints  of any pain in her vaginal area.  Review of systems otherwise negative.   PAST MEDICAL HISTORY:  Recently diagnosed diabetes, approximately four or  five months ago.   MEDICATIONS:  None.   ALLERGIES:  None.   SOCIAL HISTORY:  She denies any tobacco use.  She occasionally drinks  alcohol but socially.  She occasionally smokes marijuana.   FAMILY HISTORY:  Noted for diabetes.   PHYSICAL  EXAMINATION:  VITAL SIGNS:  Temperature 98.7, heart rate 85, blood  pressure initially 182/115; now down to 143/91.  Respirations 20. Oxygen  saturation 98% on room air.  GENERAL:  [name] [name] is alert and oriented x3, in no apparent distress.  HEENT:  Normocephalic, atraumatic.  Mucous membranes are moist.  She has no  carotid bruits.  Sclerae are mildly icteric although this may be a little  bit more difficult to appreciate in lower lighting.  HEART:  Regular rate and rhythm.  S1 and S2.  LUNGS:  Clear to auscultation bilaterally.  ABDOMEN:  Soft, obese, nontender.  Positive bowel sounds.  EXTREMITIES:  No clubbing, cyanosis or edema.   LABORATORY DATA:  Sodium 127, potassium 3.7, chloride 97, bicarb 20, BUN 7,  creatinine 0.8, glucose 512, calcium 8.8.  White count normal..  No shift.  Hemoglobin 11.4, hematocrit 34.1. MCV 86. Platelet count 182.  Urine drug  screen is negative.   ASSESSMENT/PLAN:  1.  Uncontrolled hyperglycemia.  Will start [name] [name] on glipizide and      Glucophage as well as an insulin-resistant  sliding scale with NovoLog      insulin.  Social work and Tourist information centre manager to help set [name] [name] up with      medications to last her until she is able to follow with [facility].      In addition, will also put her in for diabetes education referral.  2.  Slightly elevated blood pressure.  Will continue to monitor her.  [name]      [name] may need to be started on an ACE inhibitor prior to discharge.  3.  Status post debridement of a vaginal cyst.  Continue [name] [name] on      doxycycline 100 mg every 12 hours.  Given [name] [name]'s history of      diabetes, would prefer to put her on prophylactic antibiotics to insure      [name] infection does not return.  4.  Noted icteric sclerae.  Will check a set of liver function tests.      Annita Brod, M.D.  Electronically Signed     SKK/MEDQ  D:  06/30/2005  T:  06/30/2005  Job:  [id number]

## 2011-02-15 ENCOUNTER — Inpatient Hospital Stay (INDEPENDENT_AMBULATORY_CARE_PROVIDER_SITE_OTHER)
Admission: RE | Admit: 2011-02-15 | Discharge: 2011-02-15 | Disposition: A | Discharge status: Home / Self Care | Payer: Self-pay | Source: Ambulatory Visit | Attending: Emergency Medicine | Admitting: Emergency Medicine

## 2011-02-15 DIAGNOSIS — R7989 Other specified abnormal findings of blood chemistry: Secondary | ICD-10-CM

## 2011-02-15 DIAGNOSIS — L989 Disorder of the skin and subcutaneous tissue, unspecified: Secondary | ICD-10-CM

## 2011-02-15 DIAGNOSIS — B356 Tinea cruris: Secondary | ICD-10-CM

## 2011-02-15 LAB — GLUCOSE, CAPILLARY: Glucose-Capillary: 439 mg/dL — ABNORMAL HIGH (ref 70–99)

## 2011-02-15 LAB — POCT I-STAT, CHEM 8
HCT: 42 % (ref 36.0–46.0)
Hemoglobin: 14.3 g/dL (ref 12.0–15.0)
Potassium: 3.9 mEq/L (ref 3.5–5.1)
Sodium: 134 mEq/L — ABNORMAL LOW (ref 135–145)

## 2011-02-15 LAB — POCT URINALYSIS DIP (DEVICE)
Glucose, UA: >=1000 mg/dL — AB
Nitrite: NEGATIVE
Specific Gravity, Urine: <=1.005 (ref 1.005–1.030)
Urobilinogen, UA: 0.2 mg/dL (ref 0.0–1.0)

## 2011-02-15 LAB — POCT PREGNANCY, URINE: Preg Test, Ur: NEGATIVE

## 2011-03-02 ENCOUNTER — Emergency Department (HOSPITAL_COMMUNITY)
Admission: EM | Admit: 2011-03-02 | Discharge: 2011-03-02 | Disposition: A | Discharge status: Home / Self Care | Payer: Self-pay | Attending: Emergency Medicine | Admitting: Emergency Medicine

## 2011-03-02 ENCOUNTER — Emergency Department (HOSPITAL_COMMUNITY): Payer: Self-pay

## 2011-03-02 DIAGNOSIS — I10 Essential (primary) hypertension: Secondary | ICD-10-CM | POA: Insufficient documentation

## 2011-03-02 DIAGNOSIS — E119 Type 2 diabetes mellitus without complications: Secondary | ICD-10-CM | POA: Insufficient documentation

## 2011-03-02 DIAGNOSIS — R51 Headache: Secondary | ICD-10-CM | POA: Insufficient documentation

## 2011-03-02 DIAGNOSIS — R0602 Shortness of breath: Secondary | ICD-10-CM | POA: Insufficient documentation

## 2011-03-02 DIAGNOSIS — R0789 Other chest pain: Secondary | ICD-10-CM | POA: Insufficient documentation

## 2011-03-02 DIAGNOSIS — R110 Nausea: Secondary | ICD-10-CM | POA: Insufficient documentation

## 2011-03-02 LAB — GLUCOSE, CAPILLARY: Glucose-Capillary: 198 mg/dL — ABNORMAL HIGH (ref 70–99)

## 2011-04-23 LAB — URINALYSIS, ROUTINE W REFLEX MICROSCOPIC
Nitrite: NEGATIVE
Specific Gravity, Urine: 1.029
Urobilinogen, UA: 1

## 2011-04-23 LAB — DIFFERENTIAL
Basophils Absolute: 0
Eosinophils Absolute: 0 — ABNORMAL LOW
Lymphocytes Relative: 26
Neutro Abs: 2.7
Neutrophils Relative %: 64

## 2011-04-23 LAB — BASIC METABOLIC PANEL
BUN: 7
Calcium: 9
GFR calc non Af Amer: >60
Glucose, Bld: 342 — ABNORMAL HIGH

## 2011-04-23 LAB — CBC
Hemoglobin: 10.8 — ABNORMAL LOW
MCHC: 32.7
Platelets: 307
RDW: 13.7

## 2011-04-23 LAB — WET PREP, GENITAL: Yeast Wet Prep HPF POC: NONE SEEN

## 2011-04-23 LAB — PREGNANCY, URINE: Preg Test, Ur: NEGATIVE

## 2011-04-23 LAB — GC/CHLAMYDIA PROBE AMP, GENITAL: GC Probe Amp, Genital: NEGATIVE

## 2011-04-23 LAB — URINE MICROSCOPIC-ADD ON

## 2011-05-03 LAB — BASIC METABOLIC PANEL
CO2: 30
Calcium: 9.3
Creatinine, Ser: 0.67
GFR calc Af Amer: >60
Glucose, Bld: 277 — ABNORMAL HIGH

## 2011-05-07 ENCOUNTER — Emergency Department (HOSPITAL_COMMUNITY)
Admission: EM | Admit: 2011-05-07 | Discharge: 2011-05-07 | Disposition: A | Discharge status: Home / Self Care | Payer: Self-pay | Attending: Emergency Medicine | Admitting: Emergency Medicine

## 2011-05-07 DIAGNOSIS — Z79899 Other long term (current) drug therapy: Secondary | ICD-10-CM | POA: Insufficient documentation

## 2011-05-07 DIAGNOSIS — E1169 Type 2 diabetes mellitus with other specified complication: Secondary | ICD-10-CM | POA: Insufficient documentation

## 2011-05-07 DIAGNOSIS — I10 Essential (primary) hypertension: Secondary | ICD-10-CM | POA: Insufficient documentation

## 2011-05-07 DIAGNOSIS — T383X1A Poisoning by insulin and oral hypoglycemic [antidiabetic] drugs, accidental (unintentional), initial encounter: Secondary | ICD-10-CM | POA: Insufficient documentation

## 2011-05-07 DIAGNOSIS — T38801A Poisoning by unspecified hormones and synthetic substitutes, accidental (unintentional), initial encounter: Secondary | ICD-10-CM | POA: Insufficient documentation

## 2011-05-07 DIAGNOSIS — Z794 Long term (current) use of insulin: Secondary | ICD-10-CM | POA: Insufficient documentation

## 2011-05-07 LAB — DIFFERENTIAL
Basophils Absolute: 0 K/uL (ref 0.0–0.1)
Basophils Relative: 0 % (ref 0–1)
Eosinophils Absolute: 0.1 K/uL (ref 0.0–0.7)
Eosinophils Relative: 1 % (ref 0–5)
Lymphocytes Relative: 50 % — ABNORMAL HIGH (ref 12–46)
Lymphs Abs: 1.8 K/uL (ref 0.7–4.0)
Monocytes Absolute: 0.3 K/uL (ref 0.1–1.0)
Monocytes Relative: 7 % (ref 3–12)
Neutro Abs: 1.5 K/uL — ABNORMAL LOW (ref 1.7–7.7)
Neutrophils Relative %: 41 % — ABNORMAL LOW (ref 43–77)

## 2011-05-07 LAB — BASIC METABOLIC PANEL WITH GFR
BUN: 12 mg/dL (ref 6–23)
CO2: 23 meq/L (ref 19–32)
Calcium: 10 mg/dL (ref 8.4–10.5)
Chloride: 99 meq/L (ref 96–112)
Creatinine, Ser: 0.38 mg/dL — ABNORMAL LOW (ref 0.50–1.10)
GFR calc Af Amer: >90 mL/min
GFR calc non Af Amer: >90 mL/min
Glucose, Bld: 202 mg/dL — ABNORMAL HIGH (ref 70–99)
Potassium: 3.6 meq/L (ref 3.5–5.1)
Sodium: 134 meq/L — ABNORMAL LOW (ref 135–145)

## 2011-05-07 LAB — CBC
HCT: 38.4 % (ref 36.0–46.0)
MCHC: 33.6 g/dL (ref 30.0–36.0)
RDW: 12.9 % (ref 11.5–15.5)

## 2011-05-07 LAB — GLUCOSE, CAPILLARY
Glucose-Capillary: 243 mg/dL — ABNORMAL HIGH (ref 70–99)
Glucose-Capillary: 176 mg/dL — ABNORMAL HIGH (ref 70–99)

## 2011-07-22 ENCOUNTER — Other Ambulatory Visit: Payer: Self-pay

## 2011-07-22 ENCOUNTER — Emergency Department (HOSPITAL_COMMUNITY)
Admission: EM | Admit: 2011-07-22 | Discharge: 2011-07-23 | Disposition: A | Discharge status: Home / Self Care | Payer: Self-pay | Attending: Emergency Medicine | Admitting: Emergency Medicine

## 2011-07-22 DIAGNOSIS — I10 Essential (primary) hypertension: Secondary | ICD-10-CM | POA: Insufficient documentation

## 2011-07-22 DIAGNOSIS — Z91199 Patient's noncompliance with other medical treatment and regimen due to unspecified reason: Secondary | ICD-10-CM | POA: Insufficient documentation

## 2011-07-22 DIAGNOSIS — R079 Chest pain, unspecified: Secondary | ICD-10-CM | POA: Insufficient documentation

## 2011-07-22 DIAGNOSIS — R739 Hyperglycemia, unspecified: Secondary | ICD-10-CM

## 2011-07-22 DIAGNOSIS — Z794 Long term (current) use of insulin: Secondary | ICD-10-CM | POA: Insufficient documentation

## 2011-07-22 DIAGNOSIS — E119 Type 2 diabetes mellitus without complications: Secondary | ICD-10-CM | POA: Insufficient documentation

## 2011-07-22 DIAGNOSIS — Z9119 Patient's noncompliance with other medical treatment and regimen: Secondary | ICD-10-CM | POA: Insufficient documentation

## 2011-07-22 DIAGNOSIS — Z9114 Patient's other noncompliance with medication regimen: Secondary | ICD-10-CM

## 2011-07-22 HISTORY — DX: Essential (primary) hypertension: I10

## 2011-07-22 LAB — URINE MICROSCOPIC-ADD ON

## 2011-07-22 LAB — DIFFERENTIAL
Eosinophils Absolute: 0 10*3/uL (ref 0.0–0.7)
Lymphocytes Relative: 46 % (ref 12–46)
Lymphs Abs: 1.7 10*3/uL (ref 0.7–4.0)
Monocytes Relative: 8 % (ref 3–12)
Neutro Abs: 1.7 10*3/uL (ref 1.7–7.7)
Neutrophils Relative %: 45 % (ref 43–77)

## 2011-07-22 LAB — BASIC METABOLIC PANEL
Calcium: 9.4 mg/dL (ref 8.4–10.5)
GFR calc non Af Amer: >90 mL/min (ref >90)
Glucose, Bld: 549 mg/dL — ABNORMAL HIGH (ref 70–99)
Sodium: 130 mEq/L — ABNORMAL LOW (ref 135–145)

## 2011-07-22 LAB — POCT I-STAT, CHEM 8
Calcium, Ion: 1.18 mmol/L (ref 1.12–1.32)
Chloride: 100 mEq/L (ref 96–112)
HCT: 37 % (ref 36.0–46.0)
Hemoglobin: 12.6 g/dL (ref 12.0–15.0)
TCO2: 24 mmol/L (ref 0–100)

## 2011-07-22 LAB — CBC
Hemoglobin: 11.4 g/dL — ABNORMAL LOW (ref 12.0–15.0)
MCH: 27.9 pg (ref 26.0–34.0)
RBC: 4.09 MIL/uL (ref 3.87–5.11)
WBC: 3.7 10*3/uL — ABNORMAL LOW (ref 4.0–10.5)

## 2011-07-22 LAB — URINALYSIS, ROUTINE W REFLEX MICROSCOPIC
Glucose, UA: >1000 mg/dL — AB
Ketones, ur: NEGATIVE mg/dL
Leukocytes, UA: NEGATIVE
Protein, ur: NEGATIVE mg/dL

## 2011-07-22 LAB — GLUCOSE, CAPILLARY: Glucose-Capillary: 349 mg/dL — ABNORMAL HIGH (ref 70–99)

## 2011-07-22 MED ORDER — SODIUM CHLORIDE 0.9 % IV BOLUS (SEPSIS)
1000.0000 mL | Freq: Once | INTRAVENOUS | Status: AC
  Administered 2011-07-22: 1000 mL via INTRAVENOUS

## 2011-07-22 MED ORDER — DEXTROSE 50 % IV SOLN: 25.0000 mL | INTRAVENOUS | Status: DC | PRN

## 2011-07-22 MED ORDER — SODIUM CHLORIDE 0.9 % IV SOLN
INTRAVENOUS | Status: DC
  Filled 2011-07-22: qty 1

## 2011-07-22 MED ORDER — SODIUM CHLORIDE 0.9 % IV SOLN: INTRAVENOUS | Status: AC

## 2011-07-22 MED ORDER — INSULIN REGULAR BOLUS VIA INFUSION
10.0000 [IU] | Freq: Once | INTRAVENOUS | Status: DC
  Filled 2011-07-22: qty 10

## 2011-07-22 MED ORDER — POTASSIUM CHLORIDE 10 MEQ/100ML IV SOLN: 10.0000 meq | Freq: Once | INTRAVENOUS | Status: DC

## 2011-07-22 MED ORDER — INSULIN REGULAR HUMAN 100 UNIT/ML IJ SOLN
10.0000 [IU] | Freq: Once | INTRAMUSCULAR | Status: AC
  Administered 2011-07-22: 10 [IU] via SUBCUTANEOUS
  Filled 2011-07-22: qty 0.1

## 2011-07-22 MED ORDER — SODIUM CHLORIDE 0.9 % IV SOLN: INTRAVENOUS | Status: DC

## 2011-07-22 NOTE — ED Notes (Signed)
Patient reports that she was sent by MD for further evaluation of chestpain last pm and elevated blood sugar that she reports read high. On arrival no distress

## 2011-07-22 NOTE — ED Notes (Signed)
Pt presents to ER c/o chest pain last night and increased glucose; pt reports that she experienced left sided chest pain last night on and off, denies chest pain today or at present time; reports went to PCP today and he sent her here for evaluation; reports taking insulin and "diabetes pills" this morning; denies n/v/d, normoactive bowel sounds, no tenderness; pt a&o x 4; lung sounds clear bilaterally, denies cough

## 2011-07-22 NOTE — ED Notes (Signed)
Baker Janus NP notified of pt's recent blood sugar check. Per Baker Janus - pt will not be placed on the glucose stablizer

## 2011-07-22 NOTE — ED Notes (Signed)
Pt denies any chest pain and shortness of breath that this time. Pt states she feel really weak and tired. Pt reports some lightheadedness. Pt put on continuous cardiac/pulse ox monitoring. Pt states she has run out of her lantus but continues to take her novolog. Pt states she took 20 units of novolog this am. Pt states she did not check her blood sugar before administering the novolog.

## 2011-07-22 NOTE — ED Provider Notes (Addendum)
History     CSN: [id number]  Arrival date & time 07/22/11  61   First MD Initiated Contact with Patient 07/22/11 1959      Chief Complaint  Patient presents with  . Chest Pain  . Hyperglycemia    (Consider location/radiation/quality/duration/timing/severity/associated sxs/prior treatment) HPI Comments: [name] [name], who [facility], has been out of her Lantus insulin for months, last night developed intermittent episodes of sharp, tingly.  Left chest without shortness of breath, diaphoresis, nausea, but it did wake her from sleep several times.  She went [facility], he became concerned and sent her to the emergency room for further evaluation of her chest pain and hyperglycemia.  The history is provided by the patient.    Past Medical History  Diagnosis Date  . Diabetes mellitus   . Hypertension     History reviewed. No pertinent past surgical history.  History reviewed. No pertinent family history.  History  Substance Use Topics  . Smoking status: Former Research scientist (life sciences)  . Smokeless tobacco: Not on file  . Alcohol Use: Yes    OB History    Grav Para Term Preterm Abortions TAB SAB Ect Mult Living                  Review of Systems  Constitutional: Negative.   HENT: Negative for rhinorrhea.   Eyes: Negative for visual disturbance.  Respiratory: Negative for shortness of breath.   Cardiovascular: Positive for chest pain. Negative for palpitations and leg swelling.  Gastrointestinal: Negative for nausea, vomiting, diarrhea and constipation.  Genitourinary: Negative for dysuria and frequency.  Musculoskeletal: Negative for myalgias.  Skin: Negative.   Neurological: Negative for dizziness, weakness and numbness.  Psychiatric/Behavioral: Negative.     Allergies  Review of patient's allergies indicates no known allergies.  Home Medications   Current Outpatient Rx  Name Route Sig Dispense Refill  . INSULIN ASPART 100  UNIT/ML [place] SOLN Subcutaneous Inject 20 Units into the skin 3 (three) times daily before meals.      . INSULIN GLARGINE 100 UNIT/ML [place] SOLN Subcutaneous Inject 65 Units into the skin at bedtime.        BP 129/78  Pulse 87  Temp(Src) 98.7 F (37.1 C) (Oral)  Resp 21  SpO2 95%  Physical Exam  Constitutional: She appears well-developed.  Eyes: Pupils are equal, round, and reactive to light.  Neck: Normal range of motion.  Cardiovascular: Normal rate and regular rhythm.   Pulmonary/Chest: Effort normal and breath sounds normal.  Abdominal: Soft. Bowel sounds are normal.  Musculoskeletal: Normal range of motion. She exhibits no edema and no tenderness.  Neurological: She is alert.  Skin: Skin is warm and dry.  Psychiatric: She has a normal mood and affect.    ED Course  Procedures (including critical care time)  Labs Reviewed  URINALYSIS, ROUTINE W REFLEX MICROSCOPIC - Abnormal; Notable for the following:    APPearance CLOUDY (*)    Glucose, UA >1000 (*)    All other components within normal limits  GLUCOSE, CAPILLARY - Abnormal; Notable for the following:    Glucose-Capillary 539 (*)    All other components within normal limits  URINE MICROSCOPIC-ADD ON - Abnormal; Notable for the following:    Bacteria, UA MANY (*)    All other components within normal limits  CBC - Abnormal; Notable for the following:    WBC 3.7 (*)    Hemoglobin 11.4 (*)  HCT 34.2 (*)    All other components within normal limits  BASIC METABOLIC PANEL - Abnormal; Notable for the following:    Sodium 130 (*)    Potassium 5.3 (*) HEMOLYSIS AT THIS LEVEL MAY AFFECT RESULT   Chloride 95 (*)    Glucose, Bld 549 (*)    Creatinine, Ser 0.42 (*)    All other components within normal limits  POCT I-STAT, CHEM 8 - Abnormal; Notable for the following:    Glucose, Bld 563 (*)    All other components within normal limits  GLUCOSE, CAPILLARY - Abnormal; Notable for the following:    Glucose-Capillary 349 (*)      All other components within normal limits  GLUCOSE, CAPILLARY - Abnormal; Notable for the following:    Glucose-Capillary 314 (*)    All other components within normal limits  GLUCOSE, CAPILLARY - Abnormal; Notable for the following:    Glucose-Capillary 227 (*)    All other components within normal limits  DIFFERENTIAL  POCT I-STAT TROPONIN I  I-STAT TROPONIN I  I-STAT, CHEM 8  BASIC METABOLIC PANEL  BASIC METABOLIC PANEL  BASIC METABOLIC PANEL  POCT CBG MONITORING  POCT CBG MONITORING   No results found.   1. Hyperglycemia   2. Noncompliance with medications     ED ECG REPORT   Date: 07/23/2011  EKG Time: 12:41 AM  Rate: 84  Rhythm: normal sinus rhythm,  normal EKG, normal sinus rhythm, unchanged from previous tracings  Axis: normal  Intervals:none  ST&T Change: TAll R wave   Narrative Interpretation: borderline due to tall R wave           Blood sugar is now 227.  Instructed patient to please cover herself with her and NovoLog as needed to test her blood sugars on a regular basis, and to make sure she gets the proper documentation, so that she can purchase her Lantus insulin from [facility], tomorrow.  MDM  Hyperglycemia.  Will review CBC, electrolytes, EKG, cardiac troponin and monitor her blood sugars while  being treating with Glucomander stabilizer         Garald Balding, NP 07/22/11 2019  Garald Balding, NP 07/22/11 2124  Garald Balding, NP 07/23/11 0041  Garald Balding, NP 07/23/11 [phone]

## 2011-07-23 LAB — GLUCOSE, CAPILLARY: Glucose-Capillary: 227 mg/dL — ABNORMAL HIGH (ref 70–99)

## 2011-07-23 LAB — BASIC METABOLIC PANEL
BUN: 6 mg/dL (ref 6–23)
Calcium: 8.8 mg/dL (ref 8.4–10.5)
GFR calc Af Amer: >90 mL/min (ref >90)
GFR calc non Af Amer: >90 mL/min (ref >90)
Potassium: 3.4 mEq/L — ABNORMAL LOW (ref 3.5–5.1)
Sodium: 135 mEq/L (ref 135–145)

## 2011-07-23 NOTE — ED Provider Notes (Signed)
Medical screening examination/treatment/procedure(s) were performed by non-physician practitioner and as supervising physician I was immediately available for consultation/collaboration.  Richarda Blade, MD 07/23/11 0030

## 2011-07-25 NOTE — ED Provider Notes (Signed)
Medical screening examination/treatment/procedure(s) were performed by non-physician practitioner and as supervising physician I was immediately available for consultation/collaboration.  Richarda Blade, MD 07/25/11 [phone]

## 2011-10-08 ENCOUNTER — Encounter (HOSPITAL_COMMUNITY): Payer: Self-pay | Admitting: Family

## 2011-10-08 ENCOUNTER — Emergency Department (INDEPENDENT_AMBULATORY_CARE_PROVIDER_SITE_OTHER)
Admission: EM | Admit: 2011-10-08 | Discharge: 2011-10-08 | Disposition: A | Discharge status: Home / Self Care | Payer: Self-pay | Source: Home / Self Care

## 2011-10-08 ENCOUNTER — Inpatient Hospital Stay (HOSPITAL_COMMUNITY)
Admission: AD | Admit: 2011-10-08 | Discharge: 2011-10-08 | Disposition: A | Discharge status: Home / Self Care | Payer: Self-pay | Source: Ambulatory Visit | Attending: Obstetrics and Gynecology | Admitting: Obstetrics and Gynecology

## 2011-10-08 ENCOUNTER — Inpatient Hospital Stay (HOSPITAL_COMMUNITY): Payer: Self-pay

## 2011-10-08 ENCOUNTER — Encounter (HOSPITAL_COMMUNITY): Payer: Self-pay | Admitting: Emergency Medicine

## 2011-10-08 DIAGNOSIS — E119 Type 2 diabetes mellitus without complications: Secondary | ICD-10-CM | POA: Insufficient documentation

## 2011-10-08 DIAGNOSIS — R1900 Intra-abdominal and pelvic swelling, mass and lump, unspecified site: Secondary | ICD-10-CM

## 2011-10-08 DIAGNOSIS — N950 Postmenopausal bleeding: Secondary | ICD-10-CM

## 2011-10-08 DIAGNOSIS — N390 Urinary tract infection, site not specified: Secondary | ICD-10-CM | POA: Insufficient documentation

## 2011-10-08 DIAGNOSIS — I10 Essential (primary) hypertension: Secondary | ICD-10-CM | POA: Insufficient documentation

## 2011-10-08 DIAGNOSIS — E109 Type 1 diabetes mellitus without complications: Secondary | ICD-10-CM

## 2011-10-08 HISTORY — DX: Benign neoplasm of connective and other soft tissue, unspecified: D21.9

## 2011-10-08 LAB — POCT URINALYSIS DIP (DEVICE)
Glucose, UA: 500 mg/dL — AB
Nitrite: POSITIVE — AB
Urobilinogen, UA: 1 mg/dL (ref 0.0–1.0)

## 2011-10-08 LAB — CBC
Hemoglobin: 11.9 g/dL — ABNORMAL LOW (ref 12.0–15.0)
MCH: 28.1 pg (ref 26.0–34.0)
Platelets: 215 10*3/uL (ref 150–400)
RBC: 4.23 MIL/uL (ref 3.87–5.11)
WBC: 3.9 10*3/uL — ABNORMAL LOW (ref 4.0–10.5)

## 2011-10-08 LAB — GLUCOSE, CAPILLARY: Glucose-Capillary: 357 mg/dL — ABNORMAL HIGH (ref 70–99)

## 2011-10-08 MED ORDER — INSULIN ASPART 100 UNIT/ML ~~LOC~~ SOLN
7.0000 [IU] | Freq: Once | SUBCUTANEOUS | Status: AC
  Administered 2011-10-08: 7 [IU] via SUBCUTANEOUS

## 2011-10-08 MED ORDER — INSULIN ASPART 100 UNIT/ML ~~LOC~~ SOLN
SUBCUTANEOUS | Status: AC
  Filled 2011-10-08: qty 1

## 2011-10-08 MED ORDER — SULFAMETHOXAZOLE-TRIMETHOPRIM 800-160 MG PO TABS: 1.0000 | ORAL_TABLET | Freq: Two times a day (BID) | ORAL | Status: AC

## 2011-10-08 NOTE — ED Provider Notes (Signed)
Medical screening examination/treatment/procedure(s) were performed by non-physician practitioner and as supervising physician I was immediately available for consultation/collaboration.  Burnett Kanaris, MD 10/08/11 1336

## 2011-10-08 NOTE — ED Notes (Signed)
Pt. Complains of vaginal bleeding and lower abdominal pain that started yesterday (10-07-10) Pt. Complains of some pain with palpation in the area. Pain 5/10. AAx4 and in NAD.

## 2011-10-08 NOTE — ED Provider Notes (Signed)
History     CSN: [id number]  Arrival date & time 10/08/11  1014   None     Chief Complaint  Patient presents with  . Vaginal Bleeding    (Consider location/radiation/quality/duration/timing/severity/associated sxs/prior treatment) HPI Comments: Patient presents today with complaints of vaginal bleeding. She states that she began with pelvic cramping last night then developed vaginal bleeding. She states her last menstrual period was over a year ago. She has not had any spotting or vaginal bleeding whatsoever for over 12 months. Patient has a history of diabetes. She states that she did not check her blood sugar or take her insulin this morning because she was coming to our urgent care. She requests that we check her blood sugar this morning and address her insulin needs.    Past Medical History  Diagnosis Date  . Diabetes mellitus   . Hypertension     History reviewed. No pertinent past surgical history.  History reviewed. No pertinent family history.  History  Substance Use Topics  . Smoking status: Former Research scientist (life sciences)  . Smokeless tobacco: Not on file  . Alcohol Use: Yes    OB History    Grav Para Term Preterm Abortions TAB SAB Ect Mult Living                  Review of Systems  Gastrointestinal: Negative for nausea, vomiting, diarrhea, blood in stool and anal bleeding.  Genitourinary: Positive for vaginal bleeding and pelvic pain. Negative for hematuria.    Allergies  Review of patient's allergies indicates no known allergies.  Home Medications   Current Outpatient Rx  Name Route Sig Dispense Refill  . METFORMIN HCL 1000 MG PO TABS Oral Take 1,000 mg by mouth 2 (two) times daily with a meal.    . INSULIN ASPART 100 UNIT/ML [place] SOLN Subcutaneous Inject 20 Units into the skin 3 (three) times daily before meals.      . INSULIN GLARGINE 100 UNIT/ML [place] SOLN Subcutaneous Inject 65 Units into the skin at bedtime.        BP 171/95  Pulse 80  Temp(Src) 97.5 F (36.4 C)  (Oral)  Resp 20  SpO2 97%  LMP 04/01/2010  Physical Exam  Nursing note and vitals reviewed. Constitutional: She appears well-developed and well-nourished. No distress.  Cardiovascular: Normal rate, regular rhythm and normal heart sounds.   Pulmonary/Chest: Effort normal and breath sounds normal. No respiratory distress.  Genitourinary: There is no rash, tenderness, lesion or injury on the right labia. There is no rash, tenderness, lesion or injury on the left labia. Cervix exhibits no discharge and no friability. There is bleeding around the vagina.  Neurological: She is alert.  Skin: Skin is warm and dry.  Psychiatric: She has a normal mood and affect.    ED Course  Procedures (including critical care time)  Labs Reviewed  POCT URINALYSIS DIP (DEVICE) - Abnormal; Notable for the following:    Glucose, UA 500 (*)    Bilirubin Urine SMALL (*)    Ketones, ur 15 (*)    Hgb urine dipstick LARGE (*)    Protein, ur >=300 (*)    Nitrite POSITIVE (*)    All other components within normal limits  GLUCOSE, CAPILLARY - Abnormal; Notable for the following:    Glucose-Capillary 357 (*)    All other components within normal limits  POCT PREGNANCY, URINE   No results found.   1. Postmenopausal vaginal bleeding   2. Diabetes mellitus  MDM  Pt discharged to go to [facility] ED for evaluation of her post menopausal bleeding.        Carmel [place], [place] 10/08/11 1225

## 2011-10-08 NOTE — ED Provider Notes (Signed)
  History     CSN: [id number]  Arrival date and time: 10/08/11 1342   None     Chief Complaint  Patient presents with  . Vaginal Bleeding   Patient is a 51 y.o. female presenting with vaginal bleeding. The history is limited by a developmental delay.  Vaginal Bleeding  Ms. Weidler a 61 yr postmenopausal female present to the MAU today with the CC of vaginal bleeding with a onset of yesterday. She describes feeling abdominal cramping yesterday and then last night and this morning when she went to urinate she noticed dark "black" blood and clots in the toilet.  This prompted her to go to the urgent care. While at the urgent care her UA showed positive nitrites and leukocytes. The provider at the urgent care informed her to come to the MAU because she might have "cancer." She denies fever,back pain, other urinary symptoms, and problems with her BMs. She reports that her LMP was over 1 year ago.    OB History    Grav Para Term Preterm Abortions TAB SAB Ect Mult Living                  Past Medical History  Diagnosis Date  . Diabetes mellitus   . Hypertension     No past surgical history on file.  No family history on file.  History  Substance Use Topics  . Smoking status: Former Research scientist (life sciences)  . Smokeless tobacco: Not on file  . Alcohol Use: Yes    Allergies: No Known Allergies  Prescriptions prior to admission  Medication Sig Dispense Refill  . acetaminophen (TYLENOL) 500 MG tablet Take 500 mg by mouth once. For pain      . amLODipine (NORVASC) 10 MG tablet Take 10 mg by mouth daily.      . insulin aspart (NOVOLOG) 100 UNIT/ML injection Inject 20 Units into the skin 3 (three) times daily before meals.        . insulin glargine (LANTUS) 100 UNIT/ML injection Inject 65 Units into the skin at bedtime.        Marland Kitchen LOSARTAN POTASSIUM PO Take 100 mg by mouth every morning.      . metFORMIN (GLUCOPHAGE) 1000 MG tablet Take 1,000 mg by mouth 2 (two) times daily with a meal.         Review of Systems  Genitourinary: Positive for vaginal bleeding.  All other systems reviewed and are negative.   Physical Exam   Blood pressure 154/96, pulse 81, temperature 97.5 F (36.4 C), resp. rate 18, height 5\' 6"  (1.676 m), weight 220 lb (99.791 kg), last menstrual period 04/01/2010.  Physical Exam  Constitutional: She appears well-developed and well-nourished. No distress.  GI: Soft. She exhibits no distension.       Suprapubic tenderness appreciated.   Skin: She is not diaphoretic.  GU: Normal female, without bleeding appreciated externally   MAU Course  Procedures   Assessment and Plan  1. Postmenopausal Bleeding  2. Urinary tract infection    Beryle Beams 10/08/2011, 3:53 PM

## 2011-10-08 NOTE — MAU Provider Note (Signed)
History     CSN: [id number]  Arrival date and time: 10/08/11 1342   None     Chief Complaint  Patient presents with  . Vaginal Bleeding   HPI  [name] [name] is 51 y.o. presents with vaginal bleeding.  Seen today at [facility] and sent here for further evaluation.  She reports no period for 1 year.  Bleeding started last night.  Bleeding is described as heavier than a period.  She is a known diabetic and has hypertension.      Past Medical History  Diagnosis Date  . Diabetes mellitus   . Hypertension     No past surgical history on file.  No family history on file.  History  Substance Use Topics  . Smoking status: Former Research scientist (life sciences)  . Smokeless tobacco: Not on file  . Alcohol Use: Yes    Allergies: No Known Allergies  Prescriptions prior to admission  Medication Sig Dispense Refill  . acetaminophen (TYLENOL) 500 MG tablet Take 500 mg by mouth once. For pain      . amLODipine (NORVASC) 10 MG tablet Take 10 mg by mouth daily.      . insulin aspart (NOVOLOG) 100 UNIT/ML injection Inject 20 Units into the skin 3 (three) times daily before meals.        . insulin glargine (LANTUS) 100 UNIT/ML injection Inject 65 Units into the skin at bedtime.        Marland Kitchen LOSARTAN POTASSIUM PO Take 100 mg by mouth every morning.      . metFORMIN (GLUCOPHAGE) 1000 MG tablet Take 1,000 mg by mouth 2 (two) times daily with a meal.        Review of Systems  Genitourinary:       Vaginal bleeding  All other systems reviewed and are negative.   Physical Exam   Blood pressure 154/96, pulse 81, temperature 97.5 F (36.4 C), resp. rate 18, height 5\' 6"  (1.676 m), weight 99.791 kg (220 lb), last menstrual period 04/01/2010.  Physical Exam  Constitutional: She is oriented to person, place, and time. She appears well-developed and well-nourished.  HENT:  Head: Normocephalic.  Neck: Normal range of motion. Neck supple.  Cardiovascular: Normal rate, regular rhythm and normal heart  sounds.   Respiratory: Effort normal and breath sounds normal.  GI: Soft. She exhibits no mass. There is tenderness. There is no guarding.  Genitourinary: There is bleeding (negative clots, scant bleeding) around the vagina.  Neurological: She is alert and oriented to person, place, and time. She has normal reflexes.  Skin: Skin is warm and dry.   Ultrasound: IMPRESSION:  Stable fibroids with sizes and locations as noted above.  New complex right ovarian cyst containing a questionable mural  nodule which is avascular. Given the patient's postmenopausal  status, this needs to be viewed with suspicion and further  characterization with pelvic MRI with contrast would be  recommended.  Stable left ovarian calcification likely dystrophic or possibly  related to a dermoid calcification. Lack of change since 2008  would correlate with this representing a benign process.  Results for orders placed during the hospital encounter of 10/08/11 (from the past 24 hour(s))  CBC     Status: Abnormal   Collection Time   10/08/11  2:04 PM      Component Value Range   WBC 3.9 (*) 4.0 - 10.5 (K/uL)   RBC 4.23  3.87 - 5.11 (MIL/uL)   Hemoglobin 11.9 (*) 12.0 - 15.0 (g/dL)  HCT 35.9 (*) 36.0 - 46.0 (%)   MCV 84.9  78.0 - 100.0 (fL)   MCH 28.1  26.0 - 34.0 (pg)   MCHC 33.1  30.0 - 36.0 (g/dL)   RDW 13.4  11.5 - 15.5 (%)   Platelets 215  150 - 400 (K/uL)   UA - nitrites  MAU Course  Procedures  MDM 14:53  Care turned over to Loa Socks, CNM.  Patient is in ultrasound.  Consulted with Dr. Elly Modena, schedule outpatient MRI and have follow-up in clinic for endometrial biopsy and follow-up  Assessment and Plan  A:  Postmenopausal vaginal bleeding      UTI  P: RX Bactrim DS MRI Follow-up in GYN appt  KEY,EVE M 10/08/2011, 2:28 PM

## 2011-10-08 NOTE — Discharge Instructions (Signed)
Go directly to [facility] for evaluation of your vaginal bleeding. They have been notified.   Postmenopausal Bleeding Menopause is commonly referred to as the "change in life." It is a time when the fertile years, the time of ovulating and having menstrual periods, has come to an end. It is also determined by not having menstrual periods for 12 months.  Postmenopausal bleeding is any bleeding a woman has after she has entered into menopause. Any type of postmenopausal bleeding, even if it appears to be a typical menstrual period, is concerning. This should be evaluated by your caregiver.  CAUSES   Hormone therapy.   Cancer of the cervix or cancer of the lining of the uterus (endometrial cancer).   Thinning of the uterine lining (uterine atrophy).   Thyroid diseases.   Certain medicines.   Infection of the uterus or cervix.   Inflammation or irritation of the uterine lining (endometritis).   Estrogen-secreting tumors.   Growths (polyps) on the cervix, uterine lining, or uterus.   Uterine tumors (fibroids).   Being very overweight (obese).  DIAGNOSIS  Your caregiver will take a medical history and ask questions. A physical exam will also be performed. Further tests may include:   A transvaginal ultrasound. An ultrasound wand or probe is inserted into your vagina to view the pelvic organs.   A biopsy of the lining of the uterus (endometrium). A sample of the endometrium is removed and examined.   A hysteroscopy. Your caregiver may use an instrument with a light and a camera attached to it (hysteroscope). The hysteroscope is used to look inside the uterus for problems.   A dilation and curettage (D&C). Tissue is removed from the uterine lining to be examined for problems.  TREATMENT  Treatment depends on the cause of the bleeding. Some treatments include:   Surgery.   Medicines.   Hormones.   A hysteroscopy or D&C to remove polyps or fibroids.   Changing or stopping  a current medicine you are taking.  Talk to your caregiver about your specific treatment. HOME CARE INSTRUCTIONS   Maintain a healthy weight.   Keep regular pelvic exams and Pap tests.  SEEK MEDICAL CARE IF:   You have bleeding, even if it is light in comparison to your previous periods.   Your bleeding lasts more than 1 week.   You have abdominal pain.   You develop bleeding with sexual intercourse.  SEEK IMMEDIATE MEDICAL CARE IF:   You have a fever, chills, headache, dizziness, muscle aches, and bleeding.   You have severe pain with bleeding.   You are passing blood clots.   You have bleeding and need more than 1 pad an hour.   You feel faint.  MAKE SURE YOU:  Understand these instructions.   Will watch your condition.   Will get help right away if you are not doing well or get worse.  Document Released: 10/13/2005 Document Revised: 06/24/2011 Document Reviewed: 03/11/2011 [facility] Patient Information 2012 [place].

## 2011-10-08 NOTE — MAU Note (Signed)
Onset of vaginal bleeding since last night has not bleed in over a year was seen at Urgent Care.

## 2011-10-09 NOTE — MAU Provider Note (Signed)
Agree with above note.  [name] [name] 10/09/2011 7:24 AM

## 2011-10-23 NOTE — ED Provider Notes (Signed)
I assessed and examined pt and agree with note above.

## 2012-01-25 ENCOUNTER — Encounter (HOSPITAL_COMMUNITY): Payer: Self-pay | Admitting: *Deleted

## 2012-01-25 ENCOUNTER — Inpatient Hospital Stay (HOSPITAL_COMMUNITY)
Admission: AD | Admit: 2012-01-25 | Discharge: 2012-01-25 | Disposition: A | Discharge status: Home / Self Care | Payer: Self-pay | Source: Ambulatory Visit | Attending: Obstetrics & Gynecology | Admitting: Obstetrics & Gynecology

## 2012-01-25 DIAGNOSIS — R109 Unspecified abdominal pain: Secondary | ICD-10-CM | POA: Insufficient documentation

## 2012-01-25 DIAGNOSIS — D219 Benign neoplasm of connective and other soft tissue, unspecified: Secondary | ICD-10-CM

## 2012-01-25 DIAGNOSIS — N938 Other specified abnormal uterine and vaginal bleeding: Secondary | ICD-10-CM | POA: Insufficient documentation

## 2012-01-25 DIAGNOSIS — N949 Unspecified condition associated with female genital organs and menstrual cycle: Secondary | ICD-10-CM

## 2012-01-25 DIAGNOSIS — D259 Leiomyoma of uterus, unspecified: Secondary | ICD-10-CM | POA: Insufficient documentation

## 2012-01-25 DIAGNOSIS — N925 Other specified irregular menstruation: Secondary | ICD-10-CM | POA: Insufficient documentation

## 2012-01-25 DIAGNOSIS — R102 Pelvic and perineal pain: Secondary | ICD-10-CM

## 2012-01-25 DIAGNOSIS — N939 Abnormal uterine and vaginal bleeding, unspecified: Secondary | ICD-10-CM

## 2012-01-25 HISTORY — DX: Trichomoniasis, unspecified: A59.9

## 2012-01-25 HISTORY — DX: Methicillin resistant Staphylococcus aureus infection, unspecified site: A49.02

## 2012-01-25 HISTORY — DX: Anemia, unspecified: D64.9

## 2012-01-25 LAB — CBC WITH DIFFERENTIAL/PLATELET
Basophils Absolute: 0 10*3/uL (ref 0.0–0.1)
Basophils Relative: 1 % (ref 0–1)
Eosinophils Absolute: 0 10*3/uL (ref 0.0–0.7)
MCH: 27.9 pg (ref 26.0–34.0)
MCHC: 33.4 g/dL (ref 30.0–36.0)
Monocytes Relative: 10 % (ref 3–12)
Neutro Abs: 1.4 10*3/uL — ABNORMAL LOW (ref 1.7–7.7)
Neutrophils Relative %: 40 % — ABNORMAL LOW (ref 43–77)
Platelets: 249 10*3/uL (ref 150–400)
RDW: 13.4 % (ref 11.5–15.5)

## 2012-01-25 LAB — URINALYSIS, ROUTINE W REFLEX MICROSCOPIC
Glucose, UA: >1000 mg/dL — AB
Ketones, ur: NEGATIVE mg/dL
Leukocytes, UA: NEGATIVE
Nitrite: NEGATIVE
Specific Gravity, Urine: <1.005 — ABNORMAL LOW (ref 1.005–1.030)
pH: 5.5 (ref 5.0–8.0)

## 2012-01-25 LAB — URINE MICROSCOPIC-ADD ON

## 2012-01-25 LAB — WET PREP, GENITAL: Trich, Wet Prep: NONE SEEN

## 2012-01-25 MED ORDER — KETOROLAC TROMETHAMINE 60 MG/2ML IM SOLN
60.0000 mg | Freq: Once | INTRAMUSCULAR | Status: AC
  Administered 2012-01-25: 60 mg via INTRAMUSCULAR
  Filled 2012-01-25: qty 2

## 2012-01-25 NOTE — MAU Note (Signed)
Pt's phone rang 3 times while trying to discharge her, ride here,  Wanting to leave.

## 2012-01-25 NOTE — MAU Note (Signed)
Patient states she has a history of fibroids and was going to have surgery but states she was never called to schedule. States she started having abdominal pain and bleeding black blood with clots.

## 2012-01-25 NOTE — MAU Note (Signed)
Never got phone call regarding. ? Surgery for fibroids.  Started bleeding last night, orange in color at first then this morning was passing black clots.  No real period in almost a year, has had some bleeding.

## 2012-01-25 NOTE — MAU Provider Note (Signed)
History     CSN: [id number]  Arrival date and time: 01/25/12 1602   First Provider Initiated Contact with Patient 01/25/12 1741      Chief Complaint  Patient presents with  . Vaginal Bleeding  . Abdominal Pain   HPI [name] [name] is 51 y.o. G1P1001 Unknown weeks presenting with vaginal bleeding and pain that began last night.  Is a patient of the GYN CLINIC.  States they have plans for hysterectomy and she has been waiting for phone call.  Has not taken anything for pain.  Had U/S and MRI in March.      Past Medical History  Diagnosis Date  . Diabetes mellitus   . Hypertension   . Fibroids   . Anemia   . MRSA (methicillin resistant Staphylococcus aureus)   . Trichomonas     Past Surgical History  Procedure Date  . Cesarean section     breech    Family History  Problem Relation Age of Onset  . Other Neg Hx     History  Substance Use Topics  . Smoking status: Never Smoker   . Smokeless tobacco: Not on file  . Alcohol Use: Yes    Allergies: No Known Allergies  Prescriptions prior to admission  Medication Sig Dispense Refill  . amLODipine (NORVASC) 10 MG tablet Take 10 mg by mouth daily.      . insulin aspart (NOVOLOG) 100 UNIT/ML injection Inject 20 Units into the skin 3 (three) times daily before meals.       Marland Kitchen LOSARTAN POTASSIUM PO Take 100 mg by mouth every morning.      . metFORMIN (GLUCOPHAGE) 1000 MG tablet Take 1,000 mg by mouth 2 (two) times daily with a meal.        Review of Systems  Constitutional: Negative for fever and chills.  Respiratory: Negative.   Cardiovascular: Negative.   Gastrointestinal: Positive for abdominal pain. Negative for nausea and vomiting.  Genitourinary:       + vaginal bleeding   Physical Exam   Pulse 110, temperature 97.9 F (36.6 C), temperature source Oral, resp. rate 18, height 5' 4.5" (1.638 m), weight 88.361 kg (194 lb 12.8 oz), last menstrual period 04/01/2010, SpO2 100.00%.  Physical Exam  Constitutional:  She appears well-developed and well-nourished. No distress.  HENT:  Head: Normocephalic.  Neck: Normal range of motion.  Cardiovascular: Normal rate.   Respiratory: Effort normal.  GI: Soft. She exhibits no distension and no mass. There is no tenderness. There is no rebound and no guarding.  Genitourinary: Uterus is enlarged. Tender: moderately. Cervix exhibits no motion tenderness and no friability. Right adnexum displays no mass, no tenderness and no fullness. Left adnexum displays no mass, no tenderness and no fullness. There is bleeding (small amount of bright red bleeding without clot) around the vagina.   Results for orders placed during the hospital encounter of 01/25/12 (from the past 24 hour(s))  URINALYSIS, ROUTINE W REFLEX MICROSCOPIC     Status: Abnormal   Collection Time   01/25/12  4:15 PM      Component Value Range   Color, Urine RED (*) YELLOW   APPearance CLOUDY (*) CLEAR   Specific Gravity, Urine <1.005 (*) 1.005 - 1.030   pH 5.5  5.0 - 8.0   Glucose, UA >1000 (*) NEGATIVE mg/dL   Hgb urine dipstick LARGE (*) NEGATIVE   Bilirubin Urine NEGATIVE  NEGATIVE   Ketones, ur NEGATIVE  NEGATIVE mg/dL   Protein, ur 30 (*) NEGATIVE  mg/dL   Urobilinogen, UA 1.0  0.0 - 1.0 mg/dL   Nitrite NEGATIVE  NEGATIVE   Leukocytes, UA NEGATIVE  NEGATIVE  URINE MICROSCOPIC-ADD ON     Status: Abnormal   Collection Time   01/25/12  4:15 PM      Component Value Range   Squamous Epithelial / LPF FEW (*) RARE   WBC, UA 0-2  <3 WBC/hpf   RBC / HPF TOO NUMEROUS TO COUNT  <3 RBC/hpf   Bacteria, UA FEW (*) RARE  CBC WITH DIFFERENTIAL     Status: Abnormal   Collection Time   01/25/12  5:55 PM      Component Value Range   WBC 3.5 (*) 4.0 - 10.5 K/uL   RBC 4.41  3.87 - 5.11 MIL/uL   Hemoglobin 12.3  12.0 - 15.0 g/dL   HCT 36.8  36.0 - 46.0 %   MCV 83.4  78.0 - 100.0 fL   MCH 27.9  26.0 - 34.0 pg   MCHC 33.4  30.0 - 36.0 g/dL   RDW 13.4  11.5 - 15.5 %   Platelets 249  150 - 400 K/uL    Neutrophils Relative 40 (*) 43 - 77 %   Neutro Abs 1.4 (*) 1.7 - 7.7 K/uL   Lymphocytes Relative 50 (*) 12 - 46 %   Lymphs Abs 1.8  0.7 - 4.0 K/uL   Monocytes Relative 10  3 - 12 %   Monocytes Absolute 0.3  0.1 - 1.0 K/uL   Eosinophils Relative 0  0 - 5 %   Eosinophils Absolute 0.0  0.0 - 0.7 K/uL   Basophils Relative 1  0 - 1 %   Basophils Absolute 0.0  0.0 - 0.1 K/uL  WET PREP, GENITAL     Status: Abnormal   Collection Time   01/25/12  5:56 PM      Component Value Range   Yeast Wet Prep HPF POC NONE SEEN  NONE SEEN   Trich, Wet Prep NONE SEEN  NONE SEEN   Clue Cells Wet Prep HPF POC FEW (*) NONE SEEN   WBC, Wet Prep HPF POC FEW (*) NONE SEEN    MAU Course  Procedures GC/CHL culture lab  MDM Toradol 60mg   Im given for pain. 18:55  Patient states she is feeling much better and is ready for discharge.  Stressed importance of following up in the clinic.  Assessment and Plan  A:  Known fibroids        Abdominal Pain  P:  Discharge to home      Email sent to [facility] for a follow up appointment     She may use advil for discomfort beginning tomorrow.  KEY,EVE M 01/25/2012, 5:44 PM

## 2012-01-26 LAB — GC/CHLAMYDIA PROBE AMP, GENITAL: Chlamydia, DNA Probe: NEGATIVE

## 2012-01-26 NOTE — MAU Provider Note (Signed)
Attestation of Attending Supervision of Advanced Practitioner (CNM/NP): Evaluation and management procedures were performed by the Advanced Practitioner under my supervision and collaboration.  I have reviewed the Advanced Practitioner's note and chart, and I agree with the management and plan.  [name], [name] Loch[place] 6:24 PM

## 2012-02-07 ENCOUNTER — Other Ambulatory Visit (HOSPITAL_COMMUNITY)
Admission: RE | Admit: 2012-02-07 | Discharge: 2012-02-07 | Disposition: A | Discharge status: Home / Self Care | Payer: Self-pay | Source: Ambulatory Visit | Attending: Obstetrics & Gynecology | Admitting: Obstetrics & Gynecology

## 2012-02-07 ENCOUNTER — Encounter: Payer: Self-pay | Admitting: Advanced Practice Midwife

## 2012-02-07 ENCOUNTER — Ambulatory Visit (INDEPENDENT_AMBULATORY_CARE_PROVIDER_SITE_OTHER): Payer: Self-pay | Admitting: Obstetrics & Gynecology

## 2012-02-07 VITALS — BP 138/94 | HR 90 | Temp 97.9°F | Ht 64.0 in | Wt 190.4 lb

## 2012-02-07 DIAGNOSIS — D259 Leiomyoma of uterus, unspecified: Secondary | ICD-10-CM

## 2012-02-07 DIAGNOSIS — N939 Abnormal uterine and vaginal bleeding, unspecified: Secondary | ICD-10-CM | POA: Insufficient documentation

## 2012-02-07 DIAGNOSIS — N926 Irregular menstruation, unspecified: Secondary | ICD-10-CM

## 2012-02-07 DIAGNOSIS — N83209 Unspecified ovarian cyst, unspecified side: Secondary | ICD-10-CM

## 2012-02-07 DIAGNOSIS — Z01812 Encounter for preprocedural laboratory examination: Secondary | ICD-10-CM

## 2012-02-07 NOTE — Progress Notes (Signed)
History:  51 y.o. G1P1001 here today for evaluation of abnormal uterine bleeding.  She was seen in MAU earlier this month, had negative cultures. Of note, she has a history of fibroids, last ultrasound was in 09/2011 remarkable for small fibroids and a R complex ovarian cyst with a mural nodule. She was supposed to get a follow up MRI and this was ordered, but never completed.  Patient denies any further bleeding since 01/25/12, does report periodic RLQ pain.  No other GYN concerns.   The following portions of the patient's history were reviewed and updated as appropriate: allergies, current medications, past family history, past medical history, past social history, past surgical history and problem list.  Review of Systems:  Pertinent items are noted in HPI.  Objective:  Physical Exam Blood pressure 138/94, pulse 90, temperature 97.9 F (36.6 C), temperature source Oral, height 5\' 4"  (1.626 m), weight 190 lb 6.4 oz (86.365 kg), last menstrual period 04/01/2010. Gen: NAD Abd: Soft, nontender and nondistended Pelvic: Normal appearing external genitalia with some atrophy; normal appearing vaginal mucosa and cervix with some atrophy.  Normal discharge.  Small uterus, unable to palpate uterus or adnexa secondary to habitus.  ENDOMETRIAL BIOPSY     The indications for endometrial biopsy were reviewed.   Risks of the biopsy including cramping, bleeding, infection, uterine perforation, inadequate specimen and need for additional procedures  were discussed. The patient states she understands and agrees to undergo procedure today. Consent was signed. Time out was performed. Urine HCG was negative. A sterile speculum was placed in the patient's vagina and the cervix was prepped with Betadine. A single-toothed tenaculum was placed on the anterior lip of the cervix to stabilize it. The 3 mm pipelle was introduced into the endometrial cavity without difficulty to a depth of  6 cm, and a scant amount of tissue was  obtained and sent to pathology. The instruments were removed from the patient's vagina. Minimal bleeding from the cervix was noted. The patient tolerated the procedure well. Routine post-procedure instructions were given to the patient.   Labs and Imaging 10/08/10 TRANSABDOMINAL AND TRANSVAGINAL ULTRASOUND OF PELVIS Clinical Data: Postmenopausal bleeding. Findings: Uterus: Demonstrates a sagittal length of 8.4 cm, depth of 4.0 cm and width of 5.3 cm. Focal fibroids are again identified in the left lateral upper uterine segment measuring 2.2 x 1.7 x 1.5 cm and in the left lateral lower uterine segment with the predominance of serosal position measuring 2.6 x 2.4 x 2.0 cm. No submucosal  involvement is noted. Endometrium: Is thin and echogenic with an AP width of 4.1 cm. No areas of focal thickening or heterogeneity are seen. This is at the upper limits of acceptable for a postmenopausal patient currently experiencing vaginal bleeding. Right ovary: Measures 5.0 x 2.3 x 2.6 cm and contains a complex cyst measuring 2.8 x 3.1 by 2.6 cm. Although this is thin-walled there is an irregular internal soft tissue density identified which  appears mural based on transverse cine views through the ovary. This was not seen on two prior exams from 2009 and 2011 and would need to be viewed with suspicion despite the lack of internal flow with color Doppler assessment. Left ovary: Measures 2.8 x 2.1 x 2.4 cm and contains an area of focal calcification measuring 1.1 x 1.2 cm. This was noted on prior exams dating back to 2008 and appears stable in size suggestive of either a dermoid or dystrophic calcification. Other findings: A trace of simple free fluid is noted  in the cul-de- sac. IMPRESSION: Stable fibroids with sizes and locations as noted above. New complex right ovarian cyst containing a questionable mural  nodule which is avascular. Given the patient's postmenopausal status, this needs to be viewed with suspicion and  further characterization with pelvic MRI with contrast would be recommended. Stable left ovarian calcification likely dystrophic or possibly related to a dermoid calcification. Lack of change since 2008 would correlate with this representing a benign process. Original Report Authenticated By: Ander Gaster, M.D.  Assessment & Plan:  CA-125 ordered, MRI will be scheduled. Will follow up the results of biopsy and MRI and manage accordingly. Bleeding precautions reviewed.

## 2012-02-07 NOTE — Patient Instructions (Signed)
Ovarian Cyst The ovaries are small organs that are on each side of the uterus. The ovaries are the organs that produce the female hormones, estrogen and progesterone. An ovarian cyst is a sac filled with fluid that can vary in its size. It is normal for a small cyst to form in women who are in the childbearing age and who have menstrual periods. This type of cyst is called a follicle cyst that becomes an ovulation cyst (corpus luteum cyst) after it produces the women's egg. It later goes away on its own if the woman does not become pregnant. There are other kinds of ovarian cysts that may cause problems and may need to be treated. The most serious problem is a cyst with cancer. It should be noted that menopausal women who have an ovarian cyst are at a higher risk of it being a cancer cyst. They should be evaluated very quickly, thoroughly and followed closely. This is especially true in menopausal women because of the high rate of ovarian cancer in women in menopause. CAUSES AND TYPES OF OVARIAN CYSTS:  FUNCTIONAL CYST: The follicle/corpus luteum cyst is a functional cyst that occurs every month during ovulation with the menstrual cycle. They go away with the next menstrual cycle if the woman does not get pregnant. Usually, there are no symptoms with a functional cyst.   ENDOMETRIOMA CYST: This cyst develops from the lining of the uterus tissue. This cyst gets in or on the ovary. It grows every month from the bleeding during the menstrual period. It is also called a "chocolate cyst" because it becomes filled with blood that turns brown. This cyst can cause pain in the lower abdomen during intercourse and with your menstrual period.   CYSTADENOMA CYST: This cyst develops from the cells on the outside of the ovary. They usually are not cancerous. They can get very big and cause lower abdomen pain and pain with intercourse. This type of cyst can twist on itself, cut off its blood supply and cause severe pain.  It also can easily rupture and cause a lot of pain.   DERMOID CYST: This type of cyst is sometimes found in both ovaries. They are found to have different kinds of body tissue in the cyst. The tissue includes skin, teeth, hair, and/or cartilage. They usually do not have symptoms unless they get very big. Dermoid cysts are rarely cancerous.   POLYCYSTIC OVARY: This is a rare condition with hormone problems that produces many small cysts on both ovaries. The cysts are follicle-like cysts that never produce an egg and become a corpus luteum. It can cause an increase in body weight, infertility, acne, increase in body and facial hair and lack of menstrual periods or rare menstrual periods. Many women with this problem develop type 2 diabetes. The exact cause of this problem is unknown. A polycystic ovary is rarely cancerous.   THECA LUTEIN CYST: Occurs when too much hormone (human chorionic gonadotropin) is produced and over-stimulates the ovaries to produce an egg. They are frequently seen when doctors stimulate the ovaries for invitro-fertilization (test tube babies).   LUTEOMA CYST: This cyst is seen during pregnancy. Rarely it can cause an obstruction to the birth canal during labor and delivery. They usually go away after delivery.  SYMPTOMS   Pelvic pain or pressure.   Pain during sexual intercourse.   Increasing girth (swelling) of the abdomen.   Abnormal menstrual periods.   Increasing pain with menstrual periods.   You stop having   menstrual periods and you are not pregnant.  DIAGNOSIS  The diagnosis can be made during:  Routine or annual pelvic examination (common).   Ultrasound.   X-ray of the pelvis.   CT Scan.   MRI.   Blood tests.  TREATMENT   Treatment may only be to follow the cyst monthly for 2 to 3 months with your caregiver. Many go away on their own, especially functional cysts.   May be aspirated (drained) with a long needle with ultrasound, or by laparoscopy  (inserting a tube into the pelvis through a small incision).   The whole cyst can be removed by laparoscopy.   Sometimes the cyst may need to be removed through an incision in the lower abdomen.   Hormone treatment is sometimes used to help dissolve certain cysts.   Birth control pills are sometimes used to help dissolve certain cysts.  HOME CARE INSTRUCTIONS  Follow your caregiver's advice regarding:  Medicine.   Follow up visits to evaluate and treat the cyst.   You may need to come back or make an appointment with another caregiver, to find the exact cause of your cyst, if your caregiver is not a gynecologist.   Get your yearly and recommended pelvic examinations and Pap tests.   Let your caregiver know if you have had an ovarian cyst in the past.  SEEK MEDICAL CARE IF:   Your periods are late, irregular, they stop, or are painful.   Your stomach (abdomen) or pelvic pain does not go away.   Your stomach becomes larger or swollen.   You have pressure on your bladder or trouble emptying your bladder completely.   You have painful sexual intercourse.   You have feelings of fullness, pressure, or discomfort in your stomach.   You lose weight for no apparent reason.   You feel generally ill.   You become constipated.   You lose your appetite.   You develop acne.   You have an increase in body and facial hair.   You are gaining weight, without changing your exercise and eating habits.   You think you are pregnant.  SEEK IMMEDIATE MEDICAL CARE IF:   You have increasing abdominal pain.   You feel sick to your stomach (nausea) and/or vomit.   You develop a fever that comes on suddenly.   You develop abdominal pain during a bowel movement.   Your menstrual periods become heavier than usual.  Document Released: 07/05/2005 Document Revised: 06/24/2011 Document Reviewed: 05/08/2009 [facility] Patient Information 2012 [place], [place].  Abnormal Uterine  Bleeding Abnormal uterine bleeding can have many causes. Some cases are simply treated, while others are more serious. There are several kinds of bleeding that is considered abnormal, including:  Bleeding between periods.   Bleeding after sexual intercourse.   Spotting anytime in the menstrual cycle.   Bleeding heavier or more than normal.   Bleeding after menopause.  CAUSES  There are many causes of abnormal uterine bleeding. It can be present in teenagers, pregnant women, women during their reproductive years, and women who have reached menopause. Your caregiver will look for the more common causes depending on your age, signs, symptoms and your particular circumstance. Most cases are not serious and can be treated. Even the more serious causes, like cancer of the female organs, can be treated adequately if found in the early stages. That is why all types of bleeding should be evaluated and treated as soon as possible. DIAGNOSIS  Diagnosing the cause may take  several kinds of tests. Your caregiver may:  Take a complete history of the type of bleeding.   Perform a complete physical exam and Pap smear.   Take an ultrasound on the abdomen showing a picture of the female organs and the pelvis.   Inject dye into the uterus and Fallopian tubes and X-ray them (hysterosalpingogram).   Place fluid in the uterus and do an ultrasound (sonohysterogrqphy).   Take a CT scan to examine the female organs and pelvis.   Take an MRI to examine the female organs and pelvis. There is no X-ray involved with this procedure.   Look inside the uterus with a telescope that has a light at the end (hysteroscopy).   Scrap the inside of the uterus to get tissue to examine (Dilatation and Curettage, D&C).   Look into the pelvis with a telescope that has a light at the end (laparoscopy). This is done through a very small cut (incision) in the abdomen.  TREATMENT  Treatment will depend on the cause of the  abnormal bleeding. It can include:  Doing nothing to allow the problem to take care of itself over time.   Hormone treatment.   Birth control pills.   Treating the medical condition causing the problem.   Laparoscopy.   Major or minor surgery   Destroying the lining of the uterus with electrical currant, laser, freezing or heat (uterine ablation).  HOME CARE INSTRUCTIONS   Follow your caregiver's recommendation on how to treat your problem.   See your caregiver if you missed a menstrual period and think you may be pregnant.   If you are bleeding heavily, count the number of pads/tampons you use and how often you have to change them. Tell this to your caregiver.   Avoid sexual intercourse until the problem is controlled.  SEEK MEDICAL CARE IF:   You have any kind of abnormal bleeding mentioned above.   You feel dizzy at times.   You are 51 years old and have not had a menstrual period yet.  SEEK IMMEDIATE MEDICAL CARE IF:   You pass out.   You are changing pads/tampons every 15 to 30 minutes.   You have belly (abdominal) pain.   You have a temperature of 100 F (37.8 C) or higher.   You become sweaty or weak.   You are passing large blood clots from the vagina.   You start to feel sick to your stomach (nauseous) and throw up (vomit).  Document Released: 07/05/2005 Document Revised: 06/24/2011 Document Reviewed: 11/28/2008 [facility] Patient Information 2012 [place], [place].

## 2012-02-08 LAB — BASIC METABOLIC PANEL
BUN: 8 mg/dL (ref 6–23)
Chloride: 98 mEq/L (ref 96–112)
Glucose, Bld: 304 mg/dL — ABNORMAL HIGH (ref 70–99)
Potassium: 4 mEq/L (ref 3.5–5.3)
Sodium: 134 mEq/L — ABNORMAL LOW (ref 135–145)

## 2012-02-09 ENCOUNTER — Telehealth: Payer: Self-pay

## 2012-02-09 NOTE — Telephone Encounter (Signed)
Called pt and left message to return the call to the clinics.

## 2012-02-09 NOTE — Telephone Encounter (Signed)
Message copied by Michel Harrow on Wed Feb 09, 2012  4:21 PM ------      Message from: Verita Schneiders A      Created: Wed Feb 09, 2012  2:45 PM       Benign biopsy, please inform patient.

## 2012-02-10 ENCOUNTER — Telehealth: Payer: Self-pay | Admitting: General Practice

## 2012-02-10 ENCOUNTER — Other Ambulatory Visit: Payer: Self-pay | Admitting: Family

## 2012-02-10 NOTE — Telephone Encounter (Signed)
Patient called stating she had a missed call from [place] about test results and would like a call back.

## 2012-02-10 NOTE — Telephone Encounter (Signed)
Called patient back and informed of benign biopsy per Dr. Harolyn Rutherford note below. Patient satisfied.

## 2012-02-11 ENCOUNTER — Other Ambulatory Visit (HOSPITAL_COMMUNITY): Payer: Self-pay

## 2012-02-11 ENCOUNTER — Ambulatory Visit (HOSPITAL_COMMUNITY)
Admission: RE | Admit: 2012-02-11 | Discharge: 2012-02-11 | Disposition: A | Discharge status: Home / Self Care | Payer: Self-pay | Source: Ambulatory Visit | Attending: Family | Admitting: Family

## 2012-02-11 ENCOUNTER — Other Ambulatory Visit (HOSPITAL_COMMUNITY): Payer: Self-pay | Admitting: Family

## 2012-02-11 DIAGNOSIS — R1900 Intra-abdominal and pelvic swelling, mass and lump, unspecified site: Secondary | ICD-10-CM

## 2012-02-11 DIAGNOSIS — N9489 Other specified conditions associated with female genital organs and menstrual cycle: Secondary | ICD-10-CM | POA: Insufficient documentation

## 2012-02-11 DIAGNOSIS — D219 Benign neoplasm of connective and other soft tissue, unspecified: Secondary | ICD-10-CM | POA: Insufficient documentation

## 2012-02-11 MED ORDER — GADOBENATE DIMEGLUMINE 529 MG/ML IV SOLN
18.0000 mL | Freq: Once | INTRAVENOUS | Status: AC | PRN
  Administered 2012-02-11: 18 mL via INTRAVENOUS

## 2012-02-16 ENCOUNTER — Emergency Department (HOSPITAL_COMMUNITY)
Admission: EM | Admit: 2012-02-16 | Discharge: 2012-02-17 | Disposition: A | Discharge status: Home / Self Care | Payer: Self-pay | Attending: Emergency Medicine | Admitting: Emergency Medicine

## 2012-02-16 ENCOUNTER — Emergency Department (HOSPITAL_COMMUNITY): Payer: Self-pay

## 2012-02-16 DIAGNOSIS — R079 Chest pain, unspecified: Secondary | ICD-10-CM | POA: Insufficient documentation

## 2012-02-16 DIAGNOSIS — I10 Essential (primary) hypertension: Secondary | ICD-10-CM | POA: Insufficient documentation

## 2012-02-16 DIAGNOSIS — E119 Type 2 diabetes mellitus without complications: Secondary | ICD-10-CM | POA: Insufficient documentation

## 2012-02-16 DIAGNOSIS — Z794 Long term (current) use of insulin: Secondary | ICD-10-CM | POA: Insufficient documentation

## 2012-02-16 DIAGNOSIS — R0602 Shortness of breath: Secondary | ICD-10-CM | POA: Insufficient documentation

## 2012-02-16 LAB — TROPONIN I: Troponin I: <0.3 ng/mL (ref <0.30)

## 2012-02-17 ENCOUNTER — Encounter (HOSPITAL_COMMUNITY): Payer: Self-pay | Admitting: Emergency Medicine

## 2012-02-17 LAB — COMPREHENSIVE METABOLIC PANEL
AST: 23 U/L (ref 0–37)
CO2: 18 mEq/L — ABNORMAL LOW (ref 19–32)
Calcium: 9.6 mg/dL (ref 8.4–10.5)
Creatinine, Ser: 0.39 mg/dL — ABNORMAL LOW (ref 0.50–1.10)
GFR calc non Af Amer: >90 mL/min (ref >90)
Total Protein: 7.3 g/dL (ref 6.0–8.3)

## 2012-02-17 LAB — CBC
MCV: 82.7 fL (ref 78.0–100.0)
Platelets: 242 10*3/uL (ref 150–400)
RBC: 4.27 MIL/uL (ref 3.87–5.11)
WBC: 3.9 10*3/uL — ABNORMAL LOW (ref 4.0–10.5)

## 2012-02-17 LAB — TROPONIN I: Troponin I: <0.3 ng/mL (ref <0.30)

## 2012-02-17 MED ORDER — GI COCKTAIL ~~LOC~~
30.0000 mL | Freq: Once | ORAL | Status: AC
  Administered 2012-02-17: 30 mL via ORAL
  Filled 2012-02-17: qty 30

## 2012-02-17 MED ORDER — OMEPRAZOLE 20 MG PO CPDR: 20.0000 mg | DELAYED_RELEASE_CAPSULE | Freq: Every day | ORAL | Status: DC

## 2012-02-17 NOTE — ED Provider Notes (Signed)
History     CSN: [id number]  Arrival date & time 02/16/12  2255   First MD Initiated Contact with Patient 02/16/12 2352      Chief Complaint  Patient presents with  . Chest Pain   The history is provided by the patient.   the patient reports her chest began this morning at approximately 10:30 AM.  There is symptoms of been persistent since then with occasional episodes of sharp pain.  She denies shortness of breath.  She has no radiation of her chest discomfort.  Her discomfort is not changed by food or position.  Nothing changes or worsens her symptoms.  She's never had symptoms like this before.  She does have a history of diabetes and hypertension.  She has no family history of early heart disease.  She denies shortness of breath diaphoresis nausea or vomiting.  She denies jaw pain shoulder pain or back pain.  She's had no recent fevers chills or cough or congestion.  Past Medical History  Diagnosis Date  . Diabetes mellitus   . Hypertension   . Fibroids   . Anemia   . MRSA (methicillin resistant Staphylococcus aureus)   . Trichomonas     Past Surgical History  Procedure Date  . Cesarean section     breech    Family History  Problem Relation Age of Onset  . Other Neg Hx     History  Substance Use Topics  . Smoking status: Never Smoker   . Smokeless tobacco: Not on file  . Alcohol Use: Yes    OB History    Grav Para Term Preterm Abortions TAB SAB Ect Mult Living   1 1 1       1       Review of Systems  All other systems reviewed and are negative.    Allergies  Review of patient's allergies indicates no known allergies.  Home Medications   Current Outpatient Rx  Name Route Sig Dispense Refill  . AMLODIPINE BESYLATE 10 MG PO TABS Oral Take 10 mg by mouth daily.    . INSULIN ASPART 100 UNIT/ML [place] SOLN Subcutaneous Inject 20 Units into the skin 3 (three) times daily before meals.     Marland Kitchen LOSARTAN POTASSIUM 100 MG PO TABS Oral Take 100 mg by mouth daily.    Marland Kitchen  METFORMIN HCL 1000 MG PO TABS Oral Take 1,000 mg by mouth 2 (two) times daily with a meal.    . OMEPRAZOLE 20 MG PO CPDR Oral Take 1 capsule (20 mg total) by mouth daily. 30 capsule 0    BP 122/76  Pulse 92  Temp 97.8 F (36.6 C)  SpO2 97%  LMP 04/01/2010  Physical Exam  Nursing note and vitals reviewed. Constitutional: She is oriented to person, place, and time. She appears well-developed and well-nourished. No distress.  HENT:  Head: Normocephalic and atraumatic.  Eyes: EOM are normal.  Neck: Normal range of motion.  Cardiovascular: Normal rate, regular rhythm and normal heart sounds.   Pulmonary/Chest: Effort normal and breath sounds normal.  Abdominal: Soft. She exhibits no distension. There is no tenderness.  Musculoskeletal: Normal range of motion.  Neurological: She is alert and oriented to person, place, and time.  Skin: Skin is warm and dry.  Psychiatric: She has a normal mood and affect. Judgment normal.    ED Course  Procedures (including critical care time)   Date: 02/17/2012  Rate: 97  Rhythm: normal sinus rhythm  QRS Axis: normal  Intervals:  normal  ST/T Wave abnormalities: normal  Conduction Disutrbances: none  Narrative Interpretation:   Old EKG Reviewed: No significant changes noted     Labs Reviewed  COMPREHENSIVE METABOLIC PANEL - Abnormal; Notable for the following:    Sodium 128 (*)     Chloride 94 (*)     CO2 18 (*)     Glucose, Bld 367 (*)     Creatinine, Ser 0.39 (*)     All other components within normal limits  CBC - Abnormal; Notable for the following:    WBC 3.9 (*)     HCT 35.3 (*)     All other components within normal limits  TROPONIN I  TROPONIN I   Dg Chest 2 View  02/16/2012  *RADIOLOGY REPORT*  Clinical Data: Generalized chest pain and pressure.  Shortness of breath.  History diabetes, hypertension, smoking.  CHEST - 2 VIEW  Comparison: 03/02/2011  Findings: Heart size is normal.  Lungs are free of focal consolidations and  pleural effusions.  No edema. Visualized osseous structures have a normal appearance.  IMPRESSION: Negative exam.  Original Report Authenticated By: Glenice Bow, M.D.    I personally reviewed the imaging tests through PACS system     1. Chest pain       MDM  The patient a complete resolution of her symptoms after GI cocktail.  EKG and troponin x2 are normal.  I suspect the majority of this is esophageal and GI related.  Discharge home with PCP followup        Hoy Morn, MD 02/17/12 [phone]

## 2012-12-12 ENCOUNTER — Ambulatory Visit: Payer: Self-pay | Attending: Family Medicine | Admitting: Internal Medicine

## 2012-12-12 VITALS — BP 115/78 | HR 77 | Temp 98.3°F | Resp 18 | Wt 176.2 lb

## 2012-12-12 DIAGNOSIS — I10 Essential (primary) hypertension: Secondary | ICD-10-CM | POA: Insufficient documentation

## 2012-12-12 DIAGNOSIS — E109 Type 1 diabetes mellitus without complications: Secondary | ICD-10-CM

## 2012-12-12 DIAGNOSIS — IMO0001 Reserved for inherently not codable concepts without codable children: Secondary | ICD-10-CM

## 2012-12-12 DIAGNOSIS — E119 Type 2 diabetes mellitus without complications: Secondary | ICD-10-CM | POA: Insufficient documentation

## 2012-12-12 DIAGNOSIS — R634 Abnormal weight loss: Secondary | ICD-10-CM | POA: Insufficient documentation

## 2012-12-12 NOTE — Progress Notes (Signed)
Subjective:    Patient ID: [name] [name], female    DOB: [birth year], 52 y.o.   MRN: [id number]  HPI  52 year old female who present to establish care. The patient has seen multiple providers in the last one year. Her insulin regimen has been changed drastically from NovoLog Lantus to NPH she. She does not have any medications for me to verify what she is taking. She has also had changes in her antihypertensive medication and is now on HCTZ, lisinopril, Coreg. Patient is unable to provide me with a complete list of her medications. She states that she is noncompliant with her CBG monitoring and her last sugar which was a week ago was unmeasurable. She denies any chest pain any shortness of breath but does say that she has had numbness and tingling in her feet.  She states that she has lost about 30 pounds in the last one year She has never had a colonoscopy She had a mammogram a year ago which was negative   Review of Systems As in history of present illness    Objective:   Physical Exam  Past Medical History   Diagnosis  Date   .  Diabetes mellitus    .  Hypertension    .  Fibroids    .  Anemia    .  MRSA (methicillin resistant Staphylococcus aureus)    .  Trichomonas     Past Surgical History   Procedure  Date   .  Cesarean section      breech    Family History   Problem  Relation  Age of Onset   .  Other  Neg Hx     History   Substance Use Topics   .  Smoking status:  Never Smoker   .  Smokeless tobacco:  Not on file   .  Alcohol Use:  Yes    OB History    Grav  Para  Term  Preterm  Abortions  TAB  SAB  Ect  Mult  Living    1  1  1        1      Review of Systems  All other systems reviewed and are negative.  Allergies   Review of patient's allergies indicates no known allergies.  Home Medications    Current Outpatient Rx -to be updated on the followup visit   Name  Route  Sig  Dispense  Refill   .  AMLODIPINE BESYLATE 10 MG PO TABS  Oral  Take 10 mg by mouth  daily.     .  INSULIN ASPART 100 UNIT/ML [place] SOLN  Subcutaneous  Inject 20 Units into the skin 3 (three) times daily before meals.     Marland Kitchen  LOSARTAN POTASSIUM 100 MG PO TABS  Oral  Take 100 mg by mouth daily.     Marland Kitchen  METFORMIN HCL 1000 MG PO TABS  Oral  Take 1,000 mg by mouth 2 (two) times daily with a meal.     .  OMEPRAZOLE 20 MG PO CPDR  Oral  Take 1 capsule (20 mg total) by mouth daily.  30 capsule  0     BP 122/76  Pulse 92  Temp 97.8 F (36.6 C)  SpO2 97%  LMP 04/01/2010  Physical Exam  Nursing note and vitals reviewed.  Constitutional: She is oriented to person, place, and time. She appears well-developed and well-nourished. No distress.  HENT:  Head: Normocephalic and atraumatic.  Eyes: EOM are  normal.  Neck: Normal range of motion.  Cardiovascular: Normal rate, regular rhythm and normal heart sounds.  Pulmonary/Chest: Effort normal and breath sounds normal.  Abdominal: Soft. She exhibits no distension. There is no tenderness.  Musculoskeletal: Normal range of motion.  Neurological: She is alert and oriented to person, place, and time.  Skin: Skin is warm and dry.  Psychiatric: She has a normal mood and affect. Judgment normal.          Assessment & Plan:  Diabetes Will have a one-week followup to review her insulin regimen as the patient is not sure what she's taking right now Have strongly recommended that the patient check CBGs 3 times a day She also does not now she is taking metformin are not Patient needs to followup in a week to review her diabetes medications We'll check a hemoglobin A1c The BMP enforced compliance   Hypertension Patient is on Coreg, lisinopril, hydrochlorothiazide her medication list will be updated today and we'll obtain baseline labs   Weight loss patient will be given a referral for a colonoscopy

## 2012-12-12 NOTE — Progress Notes (Signed)
Patient here to establish care History of DM

## 2012-12-13 ENCOUNTER — Telehealth: Payer: Self-pay | Admitting: Family Medicine

## 2012-12-13 LAB — CBC WITH DIFFERENTIAL/PLATELET
Hemoglobin: 13.2 g/dL (ref 12.0–15.0)
Lymphs Abs: 1.3 10*3/uL (ref 0.7–4.0)
MCH: 29.3 pg (ref 26.0–34.0)
Monocytes Relative: 6 % (ref 3–12)
Neutro Abs: 1.9 10*3/uL (ref 1.7–7.7)
Neutrophils Relative %: 54 % (ref 43–77)
RBC: 4.5 MIL/uL (ref 3.87–5.11)

## 2012-12-13 LAB — BASIC METABOLIC PANEL
CO2: 27 mEq/L (ref 19–32)
Glucose, Bld: 648 mg/dL (ref 70–99)
Potassium: 4.3 mEq/L (ref 3.5–5.3)
Sodium: 125 mEq/L — ABNORMAL LOW (ref 135–145)

## 2012-12-13 LAB — LIPID PANEL
HDL: 63 mg/dL (ref >39)
LDL Cholesterol: 86 mg/dL (ref 0–99)
Triglycerides: 86 mg/dL (ref <150)
VLDL: 17 mg/dL (ref 0–40)

## 2012-12-13 NOTE — Telephone Encounter (Signed)
Tried to call patient to schedule and earlier appt\ Due to her labs being glucose 648 with an A1c 15.2% Left message for her to return our call

## 2012-12-13 NOTE — Telephone Encounter (Signed)
Critical glucose.

## 2012-12-21 ENCOUNTER — Ambulatory Visit: Payer: Self-pay

## 2012-12-26 ENCOUNTER — Ambulatory Visit: Payer: Self-pay

## 2013-04-21 ENCOUNTER — Other Ambulatory Visit: Payer: Self-pay

## 2013-04-21 ENCOUNTER — Encounter (HOSPITAL_COMMUNITY): Payer: Self-pay | Admitting: *Deleted

## 2013-04-21 ENCOUNTER — Emergency Department (HOSPITAL_COMMUNITY)
Admission: EM | Admit: 2013-04-21 | Discharge: 2013-04-21 | Disposition: A | Discharge status: Home / Self Care | Payer: No Typology Code available for payment source | Attending: Emergency Medicine | Admitting: Emergency Medicine

## 2013-04-21 ENCOUNTER — Emergency Department (HOSPITAL_COMMUNITY): Payer: No Typology Code available for payment source

## 2013-04-21 DIAGNOSIS — Z8614 Personal history of Methicillin resistant Staphylococcus aureus infection: Secondary | ICD-10-CM | POA: Insufficient documentation

## 2013-04-21 DIAGNOSIS — E119 Type 2 diabetes mellitus without complications: Secondary | ICD-10-CM | POA: Insufficient documentation

## 2013-04-21 DIAGNOSIS — R0789 Other chest pain: Secondary | ICD-10-CM

## 2013-04-21 DIAGNOSIS — Z862 Personal history of diseases of the blood and blood-forming organs and certain disorders involving the immune mechanism: Secondary | ICD-10-CM | POA: Insufficient documentation

## 2013-04-21 DIAGNOSIS — Z8742 Personal history of other diseases of the female genital tract: Secondary | ICD-10-CM | POA: Insufficient documentation

## 2013-04-21 DIAGNOSIS — R0602 Shortness of breath: Secondary | ICD-10-CM | POA: Insufficient documentation

## 2013-04-21 DIAGNOSIS — I10 Essential (primary) hypertension: Secondary | ICD-10-CM | POA: Insufficient documentation

## 2013-04-21 DIAGNOSIS — Z794 Long term (current) use of insulin: Secondary | ICD-10-CM | POA: Insufficient documentation

## 2013-04-21 DIAGNOSIS — R197 Diarrhea, unspecified: Secondary | ICD-10-CM | POA: Insufficient documentation

## 2013-04-21 DIAGNOSIS — Z79899 Other long term (current) drug therapy: Secondary | ICD-10-CM | POA: Insufficient documentation

## 2013-04-21 DIAGNOSIS — Z8619 Personal history of other infectious and parasitic diseases: Secondary | ICD-10-CM | POA: Insufficient documentation

## 2013-04-21 LAB — COMPREHENSIVE METABOLIC PANEL
AST: 23 U/L (ref 0–37)
Albumin: 3.7 g/dL (ref 3.5–5.2)
Chloride: 106 mEq/L (ref 96–112)
Creatinine, Ser: 0.51 mg/dL (ref 0.50–1.10)
Potassium: 3.8 mEq/L (ref 3.5–5.1)
Total Bilirubin: 0.4 mg/dL (ref 0.3–1.2)
Total Protein: 7.7 g/dL (ref 6.0–8.3)

## 2013-04-21 LAB — CBC WITH DIFFERENTIAL/PLATELET
Basophils Absolute: 0 10*3/uL (ref 0.0–0.1)
Basophils Relative: 1 % (ref 0–1)
MCHC: 34.3 g/dL (ref 30.0–36.0)
Monocytes Absolute: 0.3 10*3/uL (ref 0.1–1.0)
Neutro Abs: 1.4 10*3/uL — ABNORMAL LOW (ref 1.7–7.7)
Neutrophils Relative %: 40 % — ABNORMAL LOW (ref 43–77)
RDW: 12.6 % (ref 11.5–15.5)

## 2013-04-21 LAB — POCT I-STAT TROPONIN I: Troponin i, poc: 0 ng/mL (ref 0.00–0.08)

## 2013-04-21 MED ORDER — ASPIRIN 81 MG PO CHEW
324.0000 mg | CHEWABLE_TABLET | Freq: Once | ORAL | Status: AC
  Administered 2013-04-21: 324 mg via ORAL
  Filled 2013-04-21: qty 4

## 2013-04-21 MED ORDER — DIPHENOXYLATE-ATROPINE 2.5-0.025 MG/5ML PO LIQD
5.0000 mL | Freq: Once | ORAL | Status: AC
  Administered 2013-04-21: 5 mL via ORAL
  Filled 2013-04-21: qty 5

## 2013-04-21 NOTE — ED Notes (Signed)
The pt is c/o lt upper chest pain all night.  She took a sl nitro that did not help.  According to the pt she does not have cardiac problems.

## 2013-04-21 NOTE — ED Provider Notes (Signed)
10:32 AM Patient feels much better at this time.  No chest pain.  Troponin x2 is normal.  EKG without acute ischemic changes.  Discharge home with outpatient cardiology and PCP followup.  She understands to return to the ER for new or worsening symptoms.  Hoy Morn, MD 04/21/13 [phone]

## 2013-04-21 NOTE — ED Notes (Signed)
Report received, assumed care.  

## 2013-04-21 NOTE — ED Provider Notes (Signed)
CSN: [id number]     Arrival date & time 04/21/13  [id number] History   First MD Initiated Contact with Patient 04/21/13 0534     Chief Complaint  Patient presents with  . Chest Pain   (Consider location/radiation/quality/duration/timing/severity/associated sxs/prior Treatment) HPI Patient presents with "grabbing-like" pain to her left upper chest waking her from sleep at 1 AM today. The pain is episodic and she is no longer in pain. She did state that she took a nitroglycerin with little relief. She's been seen in the past for similar type of pain and had negative emergency department workup for coronary artery disease. She has occasional shortness of breath though currently she is having none. She denies any cough or fevers. She's had no lower extremity swelling or pain. She denies any leg the travel or recent surgeries. Patient does have a history of hypertension and diabetes. She has never seen a cardiologist.  Past Medical History  Diagnosis Date  . Diabetes mellitus   . Hypertension   . Fibroids   . Anemia   . MRSA (methicillin resistant Staphylococcus aureus)   . Trichomonas    Past Surgical History  Procedure Laterality Date  . Cesarean section      breech   Family History  Problem Relation Age of Onset  . Other Neg Hx    History  Substance Use Topics  . Smoking status: Never Smoker   . Smokeless tobacco: Not on file  . Alcohol Use: Yes   OB History   Grav Para Term Preterm Abortions TAB SAB Ect Mult Living   1 1 1       1      Review of Systems  Constitutional: Negative for fever and chills.  Respiratory: Positive for chest tightness and shortness of breath. Negative for cough.   Cardiovascular: Positive for chest pain. Negative for palpitations and leg swelling.  Gastrointestinal: Negative for nausea, vomiting, abdominal pain and diarrhea.  Musculoskeletal: Negative for myalgias, back pain and joint swelling.  Skin: Negative for rash and wound.  Neurological: Negative  for dizziness, weakness, light-headedness, numbness and headaches.  All other systems reviewed and are negative.    Allergies  Review of patient's allergies indicates no known allergies.  Home Medications   Current Outpatient Rx  Name  Route  Sig  Dispense  Refill  . amLODipine (NORVASC) 10 MG tablet   Oral   Take 10 mg by mouth daily.         . carvedilol (COREG) 6.25 MG tablet   Oral   Take 6.25 mg by mouth 2 (two) times daily with a meal.         . hydrochlorothiazide (HYDRODIURIL) 25 MG tablet   Oral   Take 25 mg by mouth daily.         . insulin aspart (NOVOLOG) 100 UNIT/ML injection   Subcutaneous   Inject 20 Units into the skin 3 (three) times daily before meals.          Marland Kitchen lisinopril (PRINIVIL,ZESTRIL) 20 MG tablet   Oral   Take 20 mg by mouth daily. Take two tablets by ;mouth daily         . losartan (COZAAR) 100 MG tablet   Oral   Take 100 mg by mouth daily.         . metFORMIN (GLUCOPHAGE) 1000 MG tablet   Oral   Take 1,000 mg by mouth 2 (two) times daily with a meal.         .  EXPIRED: omeprazole (PRILOSEC) 20 MG capsule   Oral   Take 1 capsule (20 mg total) by mouth daily.   30 capsule   0    BP 174/98  Pulse 97  Temp(Src) 98.3 F (36.8 C) (Oral)  Resp 20  Ht 5\' 6"  (1.676 m)  Wt 192 lb (87.091 kg)  BMI 31 kg/m2  SpO2 98%  LMP 04/01/2010 Physical Exam  Nursing note and vitals reviewed. Constitutional: She is oriented to person, place, and time. She appears well-developed and well-nourished. No distress.  HENT:  Head: Normocephalic and atraumatic.  Mouth/Throat: Oropharynx is clear and moist.  Eyes: EOM are normal. Pupils are equal, round, and reactive to light.  Neck: Normal range of motion. Neck supple.  Cardiovascular: Normal rate and regular rhythm.   Pulmonary/Chest: Effort normal and breath sounds normal. No respiratory distress. She has no wheezes. She has no rales. She exhibits no tenderness.  Abdominal: Soft. Bowel  sounds are normal. She exhibits no distension and no mass. There is tenderness (mild epigastric tenderness). There is no rebound and no guarding.  Musculoskeletal: Normal range of motion. She exhibits no edema and no tenderness.  No calf swelling or tenderness.  Neurological: She is alert and oriented to person, place, and time.  Patient is alert and oriented x3 with clear, goal oriented speech. Patient has 5/5 motor in all extremities. Sensation is intact to light touch.    Skin: Skin is warm and dry. No rash noted. No erythema.  Psychiatric: She has a normal mood and affect. Her behavior is normal.    ED Course  Procedures (including critical care time) Labs Review Labs Reviewed  CBC WITH DIFFERENTIAL - Abnormal; Notable for the following:    WBC 3.5 (*)    Neutrophils Relative % 40 (*)    Neutro Abs 1.4 (*)    Lymphocytes Relative 51 (*)    All other components within normal limits  COMPREHENSIVE METABOLIC PANEL  D-DIMER, QUANTITATIVE  POCT I-STAT TROPONIN I   Imaging Review No results found.  Date: 04/21/2013  Rate: 105  Rhythm: sinus tachycardia  QRS Axis: normal  Intervals: normal  ST/T Wave abnormalities: normal  Conduction Disutrbances:none  Narrative Interpretation:   Old EKG Reviewed: unchanged   MDM    The patient remains asymptomatic in the emergency department. She voiced so the nurse that she was having several episodes of diarrhea and was asking for medication for that. Her initial cardiac workup was negative. I discussed her case with Dr. [place] his stated he felt that she was low risk given her atypical symptoms. Suggested repeat troponin and then outpatient followup. Signed out to oncoming emergency physician pending repeat troponin.  Julianne Rice, MD 04/23/13 [phone]

## 2013-09-06 DIAGNOSIS — K219 Gastro-esophageal reflux disease without esophagitis: Secondary | ICD-10-CM | POA: Insufficient documentation

## 2013-09-06 DIAGNOSIS — I10 Essential (primary) hypertension: Secondary | ICD-10-CM | POA: Insufficient documentation

## 2013-09-12 DIAGNOSIS — Z961 Presence of intraocular lens: Secondary | ICD-10-CM | POA: Insufficient documentation

## 2013-09-28 DIAGNOSIS — H26499 Other secondary cataract, unspecified eye: Secondary | ICD-10-CM | POA: Insufficient documentation

## 2013-11-08 DIAGNOSIS — Z961 Presence of intraocular lens: Secondary | ICD-10-CM | POA: Insufficient documentation

## 2014-01-29 ENCOUNTER — Encounter: Payer: Self-pay | Admitting: Internal Medicine

## 2014-01-29 ENCOUNTER — Ambulatory Visit (INDEPENDENT_AMBULATORY_CARE_PROVIDER_SITE_OTHER): Payer: Medicaid Other | Admitting: Internal Medicine

## 2014-01-29 VITALS — BP 128/86 | HR 102 | Temp 97.6°F | Resp 12 | Ht 65.0 in | Wt 219.0 lb

## 2014-01-29 DIAGNOSIS — E11319 Type 2 diabetes mellitus with unspecified diabetic retinopathy without macular edema: Secondary | ICD-10-CM

## 2014-01-29 DIAGNOSIS — E119 Type 2 diabetes mellitus without complications: Secondary | ICD-10-CM | POA: Insufficient documentation

## 2014-01-29 DIAGNOSIS — E1139 Type 2 diabetes mellitus with other diabetic ophthalmic complication: Secondary | ICD-10-CM

## 2014-01-29 DIAGNOSIS — E785 Hyperlipidemia, unspecified: Secondary | ICD-10-CM | POA: Insufficient documentation

## 2014-01-29 DIAGNOSIS — E1169 Type 2 diabetes mellitus with other specified complication: Secondary | ICD-10-CM | POA: Insufficient documentation

## 2014-01-29 MED ORDER — INSULIN GLARGINE 100 UNIT/ML SOLOSTAR PEN: 50.0000 [IU] | PEN_INJECTOR | Freq: Every day | SUBCUTANEOUS | Status: DC

## 2014-01-29 MED ORDER — INSULIN ASPART 100 UNIT/ML FLEXPEN: 10.0000 [IU] | PEN_INJECTOR | Freq: Three times a day (TID) | SUBCUTANEOUS | Status: DC

## 2014-01-29 NOTE — Progress Notes (Signed)
Patient ID: [name] [name], female   DOB: 08-19-1960, 53 y.o.   MRN: [id number]  HPI: [name] [name] is a 53 y.o.-year-old female, referred by her PCP, Dr. Doreene Burke (FNP Wendie Simmer), for management of DM2, insulin-dependent, uncontrolled, with complications (DR, PN).  Patient has been diagnosed with diabetes in 2005; she started insulin in 2005. Last hemoglobin A1c was: Lab Results  Component Value Date   HGBA1C 15.2* 12/12/2012   HGBA1C 12.4* 04/29/2010   HGBA1C 14 02/19/2010   Pt is on a regimen of: - Metformin 1000 mg po bid >> now tolerates it fine - NovoLog 70/30 50 units in am and 35 units in pm - takes it before the meal, sometimes 15 min before - NovoLog 12 units at lunch - misses it frequently  Pt checks her sugars 2x a day and they are: - am: 130-200s - 2h after b'fast: n/c - before lunch: n/c - 2h after lunch: n/c - before dinner: n/c - bedtime: 200s No lows <90. Highest sugar was HI; she has hypoglycemia awareness at 90.   Pt's meals are - smaller portions now: - Breakfast: cereal + 2% milk; egg + toast; - Lunch: bologna sandwich; burger; chicken box - Dinner: beef stew; baked chicken + collard greens or string beans; rice  - Snacks: fruit, chips, sometimes kit kat Occasional sodas - diet; water.   - no CKD, last BUN/creatinine:  Lab Results  Component Value Date   BUN 11 04/21/2013   CREATININE 0.51 04/21/2013  On Losartan. - last set of lipids: Lab Results  Component Value Date   CHOL 166 12/12/2012   HDL 63 12/12/2012   LDLCALC 86 12/12/2012   TRIG 86 12/12/2012   CHOLHDL 2.6 12/12/2012  Not on statins. - last eye exam was in 12/2013. + DR OU?Marland Kitchen Has cataracts OU. - + numbness, pain, and tingling in her feet. Now better on Amitriptyline.   Pt has FH of DM in brother, sisters.   ROS: Constitutional: + weight gain, no fatigue, no subjective hyperthermia/hypothermia Eyes: no blurry vision, no xerophthalmia ENT: no sore throat, no nodules palpated in  throat, no dysphagia/odynophagia, no hoarseness Cardiovascular: + CP - sharp, occasionally/+ SOB/no palpitations/+ leg swelling Respiratory: + cough/+ SOB Gastrointestinal: no N/V/D/C, + heartburn Musculoskeletal: no muscle/joint aches, + bone pain  Skin: no rashes, + itching Neurological: no tremors/numbness/tingling/dizziness, + HA Psychiatric: no depression/anxiety + low libido  Past Medical History  Diagnosis Date  . Diabetes mellitus   . Hypertension   . Fibroids   . Anemia   . MRSA (methicillin resistant Staphylococcus aureus)   . Trichomonas    Past Surgical History  Procedure Laterality Date  . Cesarean section      breech   History   Social History  . Marital Status: Legally Separated    Spouse Name: N/A    Number of Children: 1   Occupational History  . n/a   Social History Main Topics  . Smoking status: Never Smoker   . Smokeless tobacco: Not on file  . Alcohol Use: Yes  . Drug Use: No     Comment: drug free 2009  . Sexual Activity: Yes   Current Outpatient Prescriptions on File Prior to Visit  Medication Sig Dispense Refill  . amitriptyline (ELAVIL) 25 MG tablet Take 25 mg by mouth at bedtime.      Marland Kitchen amLODipine (NORVASC) 5 MG tablet Take 5 mg by mouth daily.      Marland Kitchen ibuprofen (ADVIL,MOTRIN) 600 MG tablet Take 600  mg by mouth every 8 (eight) hours as needed for pain.      Marland Kitchen losartan-hydrochlorothiazide (HYZAAR) 100-12.5 MG per tablet Take 1 tablet by mouth daily.      . metFORMIN (GLUCOPHAGE) 1000 MG tablet Take 1,000 mg by mouth 2 (two) times daily with a meal.      . pantoprazole (PROTONIX) 40 MG tablet Take 40 mg by mouth daily.      . nitroGLYCERIN (NITROSTAT) 0.4 MG SL tablet Place 0.4 mg under the tongue every 5 (five) minutes as needed for chest pain.       No current facility-administered medications on file prior to visit.   No Known Allergies Family History  Problem Relation Age of Onset  . Other Neg Hx     PE: BP 128/86  Pulse 102   Temp(Src) 97.6 F (36.4 C) (Oral)  Resp 12  Ht '5\' 5"'  (1.651 m)  Wt 219 lb (99.338 kg)  BMI 36.44 kg/m2  SpO2 98%  LMP 04/01/2010 Wt Readings from Last 3 Encounters:  01/29/14 219 lb (99.338 kg)  04/21/13 192 lb (87.091 kg)  12/12/12 176 lb 3.2 oz (79.924 kg)   Constitutional: obese, in NAD Eyes: PERRLA, EOMI, no exophthalmos ENT: moist mucous membranes, no thyromegaly, no cervical lymphadenopathy Cardiovascular: RRR, No MRG Respiratory: CTA B Gastrointestinal: abdomen soft, NT, ND, BS+ Musculoskeletal: no deformities, strength intact in all 4 Skin: moist, warm, no rashes Neurological: no tremor with outstretched hands, DTR normal in all 4  ASSESSMENT: 1. 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 Patient with long-standing, uncontrolled diabetes, on oral antidiabetic regimen, with h/o diet and medication noncompliance. She tells me that she is now determined to gain control of her DM.   - We discussed about options for treatment, and I suggested to:  Patient Instructions  Please continue Metformin 1000 mg 2x a day with meals. Stop NovoLog 70/30. - Start Lantus 50 units at bedtime - Start NovoLog  10 units for a smaller meal 14 units for a regular meal 16 units for a large meal - add the following Sliding scale of NovoLog: - 150- 165: + 1 unit  - 166- 180: + 2 units  - 181- 195: + 3 units  - 196- 210: + 4 units  - > 210: + 5 units  Please inject NovoLog 15 min before a meal. When injecting insulin:  Inject in the abdomen  Rotate the injection sites around the belly button  Change needle for each injection  Keep needle in for 10 sec after last unit of insulin in  Keep the insulin in use out of the fridge  Please return in 1 month with your sugar log.   - Strongly advised her to start checking sugars at different times of the day - check 3 times a day, rotating checks - given sugar log and advised  how to fill it and to bring it at next appt  - given foot care handout and explained the principles  - given instructions for hypoglycemia management "15-15 rule"  - advised for yearly eye exams - she is up to date - Return to clinic in 1 mo with sugar log

## 2014-01-29 NOTE — Patient Instructions (Signed)
Please continue Metformin 1000 mg 2x a day with meals. Stop NovoLog 70/30. - Start Lantus 50 units at bedtime - Start NovoLog  10 units for a smaller meal 14 units for a regular meal 16 units for a large meal - add the following Sliding scale of NovoLog: - 150- 165: + 1 unit  - 166- 180: + 2 units  - 181- 195: + 3 units  - 196- 210: + 4 units  - > 210: + 5 units  Please inject NovoLog 15 min before a meal. When injecting insulin:  Inject in the abdomen  Rotate the injection sites around the belly button  Change needle for each injection  Keep needle in for 10 sec after last unit of insulin in  Keep the insulin in use out of the fridge  Please return in 1 month with your sugar log.   PATIENT INSTRUCTIONS FOR TYPE 2 DIABETES:  **Please join MyChart!** - see attached instructions about how to join if you have not done so already.  DIET AND EXERCISE Diet and exercise is an important part of diabetic treatment.  We recommended aerobic exercise in the form of brisk walking (working between 40-60% of maximal aerobic capacity, similar to brisk walking) for 150 minutes per week (such as 30 minutes five days per week) along with 3 times per week performing 'resistance' training (using various gauge rubber tubes with handles) 5-10 exercises involving the major muscle groups (upper body, lower body and core) performing 10-15 repetitions (or near fatigue) each exercise. Start at half the above goal but build slowly to reach the above goals. If limited by weight, joint pain, or disability, we recommend daily walking in a swimming pool with water up to waist to reduce pressure from joints while allow for adequate exercise.    BLOOD GLUCOSES Monitoring your blood glucoses is important for continued management of your diabetes. Please check your blood glucoses 2-4 times a day: fasting, before meals and at bedtime (you can rotate these measurements - e.g. one day check before the 3 meals, the next  day check before 2 of the meals and before bedtime, etc.).   HYPOGLYCEMIA (low blood sugar) Hypoglycemia is usually a reaction to not eating, exercising, or taking too much insulin/ other diabetes drugs.  Symptoms include tremors, sweating, hunger, confusion, headache, etc. Treat IMMEDIATELY with 15 grams of Carbs:   4 glucose tablets    cup regular juice/soda   2 tablespoons raisins   4 teaspoons sugar   1 tablespoon honey Recheck blood glucose in 15 mins and repeat above if still symptomatic/blood glucose <100.  RECOMMENDATIONS TO REDUCE YOUR RISK OF DIABETIC COMPLICATIONS: * Take your prescribed MEDICATION(S) * Follow a DIABETIC diet: Complex carbs, fiber rich foods, (monounsaturated and polyunsaturated) fats * AVOID saturated/trans fats, high fat foods, >2,300 mg salt per day. * EXERCISE at least 5 times a week for 30 minutes or preferably daily.  * DO NOT SMOKE OR DRINK more than 1 drink a day. * Check your FEET every day. Do not wear tightfitting shoes. Contact us if you develop an ulcer * See your EYE doctor once a year or more if needed * Get a FLU shot once a year * Get a PNEUMONIA vaccine once before and once after age 92 years  GOALS:  * Your Hemoglobin A1c of <7%  * fasting sugars need to be <130 * after meals sugars need to be <180 (2h after you start eating) * Your Systolic BP should be  140 or lower  * Your Diastolic BP should be 80 or lower  * Your HDL (Good Cholesterol) should be 40 or higher  * Your LDL (Bad Cholesterol) should be 100 or lower. * Your Triglycerides should be 150 or lower  * Your Urine microalbumin (kidney function) should be <30 * Your Body Mass Index should be 25 or lower   We will be glad to help you achieve these goals. Our telephone number is: [phone].

## 2014-03-12 ENCOUNTER — Ambulatory Visit: Payer: Medicaid Other | Admitting: Internal Medicine

## 2014-03-28 ENCOUNTER — Ambulatory Visit (INDEPENDENT_AMBULATORY_CARE_PROVIDER_SITE_OTHER): Payer: Medicaid Other | Admitting: Internal Medicine

## 2014-03-28 ENCOUNTER — Encounter: Payer: Self-pay | Admitting: Internal Medicine

## 2014-03-28 ENCOUNTER — Other Ambulatory Visit: Payer: Self-pay | Admitting: *Deleted

## 2014-03-28 VITALS — BP 126/84 | HR 112 | Temp 97.8°F | Resp 16 | Wt 215.0 lb

## 2014-03-28 DIAGNOSIS — E1139 Type 2 diabetes mellitus with other diabetic ophthalmic complication: Secondary | ICD-10-CM

## 2014-03-28 DIAGNOSIS — E11319 Type 2 diabetes mellitus with unspecified diabetic retinopathy without macular edema: Secondary | ICD-10-CM

## 2014-03-28 LAB — HEMOGLOBIN A1C: Hgb A1c MFr Bld: 12.4 % — ABNORMAL HIGH (ref 4.6–6.5)

## 2014-03-28 MED ORDER — ACCU-CHEK SOFTCLIX LANCETS MISC: Status: DC

## 2014-03-28 MED ORDER — GLUCOSE BLOOD VI STRP: ORAL_STRIP | Status: DC

## 2014-03-28 NOTE — Progress Notes (Signed)
Patient ID: [name] [name], female   DOB: 10-24-1960, 53 y.o.   MRN: [id number]  HPI: [name] [name] is a 53 y.o.-year-old female, returning for DM2, dx 2005, insulin-dependent since 2005, uncontrolled, with complications (DR, PN). Last visit 1.5 mo ago.  Last hemoglobin A1c was: Lab Results  Component Value Date   HGBA1C 15.2* 12/12/2012   HGBA1C 12.4* 04/29/2010   HGBA1C 14 02/19/2010   Pt was on a regimen of: - Metformin 1000 mg po bid >> now tolerates it fine - NovoLog 70/30 50 units in am and 35 units in pm - takes it before the meal, sometimes 15 min before - NovoLog 12 units at lunch - missing it frequently  Pt was supposed to be now on: - Metformin 1000 mg 2x a day with meals. - Lantus 50 units at bedtime  - NovoLog  10 units for a smaller meal 14 units for a regular meal 16 units for a large meal - Sliding scale of NovoLog: - 150- 165: + 1 unit  - 166- 180: + 2 units  - 181- 195: + 3 units  - 196- 210: + 4 units  - > 210: + 5 units  But she actually continued the previous regimen ... ??  Pt started checking her sugars - up until 1 mo ago >> lost meter. Before this: - am: 130-200s >> 90-200 (highest CBG if she forgets the insulin) - 2h after b'fast: n/c - before lunch: n/c - 2h after lunch: n/c - before dinner: n/c - bedtime: 200s No lows <90. Highest sugar was HI; she has hypoglycemia awareness at 90.   Pt's meals are - smaller portions now: - Breakfast: cereal + 2% milk; egg + toast; - Lunch: bologna sandwich; burger; chicken box - Dinner: beef stew; baked chicken + collard greens or string beans; rice  - Snacks: fruit, chips, sometimes kit kat Occasional sodas - diet; water.   - no CKD, last BUN/creatinine:  Lab Results  Component Value Date   BUN 11 04/21/2013   CREATININE 0.51 04/21/2013  On Losartan. - last set of lipids: Lab Results  Component Value Date   CHOL 166 12/12/2012   HDL 63 12/12/2012   LDLCALC 86 12/12/2012   TRIG 86 12/12/2012   CHOLHDL  2.6 12/12/2012  Not on statins. - last eye exam was in 12/2013. + DR OU?Marland Kitchen Has cataracts OU. - + numbness, pain, and tingling in her feet. On Amitriptyline.   I reviewed pt's medications, allergies, PMH, social hx, family hx and no changes required, except as mentioned above.   ROS: Constitutional: no weight loss/gain, no fatigue, no subjective hyperthermia/hypothermia Eyes: no blurry vision, no xerophthalmia ENT: no sore throat, no nodules palpated in throat, no dysphagia/odynophagia, no hoarseness Cardiovascular: + CP - sharp, occasionally/no SOB/no palpitations/+ leg swelling Respiratory: + cough/+ SOB Gastrointestinal: no N/V/D/C, + heartburn Musculoskeletal: no muscle/joint aches Skin: no rashes Neurological: no tremors/numbness/tingling/dizziness  PE: BP 126/84  Pulse 112  Temp(Src) 97.8 F (36.6 C) (Oral)  Resp 16  Wt 215 lb (97.523 kg)  SpO2 95%  LMP 04/01/2010 Wt Readings from Last 3 Encounters:  03/28/14 215 lb (97.523 kg)  01/29/14 219 lb (99.338 kg)  04/21/13 192 lb (87.091 kg)   Constitutional: obese, in NAD Eyes: PERRLA, EOMI, no exophthalmos ENT: moist mucous membranes, no thyromegaly, no cervical lymphadenopathy Cardiovascular: RRR, No MRG Respiratory: CTA B Gastrointestinal: abdomen soft, NT, ND, BS+ Musculoskeletal: no deformities, strength intact in all 4 Skin: moist, warm, no rashes Neurological: no  tremor with outstretched hands, DTR normal in all 4  ASSESSMENT: 1. 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 Patient with long-standing, uncontrolled diabetes, on oral antidiabetic regimen + premixed insulin, with h/o diet and medication noncompliance. She did not change her regimen to basal-bolus insulin as I advised her at last visit, she did not remember we made the change... I advised her to start the suggested regimen. - Will also give her an AccuChek meter and send Lancets and strips to  her pharmacy - We discussed about options for treatment, and I suggested to:  Patient Instructions  Please continue Metformin 1000 mg 2x a day with meals. Stop NovoLog 70/30. - Start Lantus 50 units at bedtime - Start NovoLog  10 units for a smaller meal 14 units for a regular meal 16 units for a large meal - add the following Sliding scale of NovoLog: - 150- 165: + 1 unit  - 166- 180: + 2 units  - 181- 195: + 3 units  - 196- 210: + 4 units  - > 210: + 5 units  Please inject NovoLog 15 min before a meal. When injecting insulin:  Inject in the abdomen  Rotate the injection sites around the belly button  Change needle for each injection  Keep needle in for 10 sec after last unit of insulin in  Keep the insulin in use out of the fridge Please return in 1 month with your sugar log.  - continue checking sugars at different times of the day - check 3 times a day, rotating checks - advised for yearly eye exams - she is up to date - will check a HbA1c today - Return to clinic in 1 mo with sugar log   Office Visit on 03/28/2014  Component Date Value Ref Range Status  . Hemoglobin A1C 03/28/2014 12.4* 4.6 - 6.5 % Final   Glycemic Control Guidelines for People with Diabetes:Non Diabetic:  <6%Goal of Therapy: <7%Additional Action Suggested:  >8%     HbA1c decreasing. Still very high, though.

## 2014-03-28 NOTE — Patient Instructions (Signed)
Please continue Metformin 1000 mg 2x a day with meals. Stop NovoLog 70/30. - Start Lantus 50 units at bedtime - Start NovoLog  10 units for a smaller meal 14 units for a regular meal 16 units for a large meal - add the following Sliding scale of NovoLog: - 150- 165: + 1 unit  - 166- 180: + 2 units  - 181- 195: + 3 units  - 196- 210: + 4 units  - > 210: + 5 units  Please inject NovoLog 15 min before a meal. When injecting insulin:  Inject in the abdomen  Rotate the injection sites around the belly button  Change needle for each injection  Keep needle in for 10 sec after last unit of insulin in  Keep the insulin in use out of the fridge  Please return in 1 month with your sugar log.

## 2014-03-29 ENCOUNTER — Encounter: Payer: Self-pay | Admitting: *Deleted

## 2014-04-01 ENCOUNTER — Telehealth: Payer: Self-pay | Admitting: Internal Medicine

## 2014-04-01 MED ORDER — ACCU-CHEK SOFTCLIX LANCETS MISC: Status: DC

## 2014-04-01 NOTE — Telephone Encounter (Signed)
Rx refill done.  

## 2014-04-01 NOTE — Telephone Encounter (Signed)
Need accu check softclix lancets prescription fax to her pharmacy.

## 2014-05-09 ENCOUNTER — Ambulatory Visit: Payer: Medicaid Other | Admitting: Internal Medicine

## 2014-05-13 ENCOUNTER — Encounter: Payer: Self-pay | Admitting: Internal Medicine

## 2014-05-13 ENCOUNTER — Other Ambulatory Visit: Payer: Self-pay | Admitting: *Deleted

## 2014-05-13 ENCOUNTER — Ambulatory Visit (INDEPENDENT_AMBULATORY_CARE_PROVIDER_SITE_OTHER): Payer: Medicaid Other | Admitting: Internal Medicine

## 2014-05-13 VITALS — BP 124/78 | HR 120 | Temp 97.7°F | Resp 12 | Wt 213.0 lb

## 2014-05-13 DIAGNOSIS — E11319 Type 2 diabetes mellitus with unspecified diabetic retinopathy without macular edema: Secondary | ICD-10-CM

## 2014-05-13 DIAGNOSIS — Z23 Encounter for immunization: Secondary | ICD-10-CM

## 2014-05-13 MED ORDER — INSULIN PEN NEEDLE 32G X 4 MM MISC: Status: DC

## 2014-05-13 MED ORDER — INSULIN GLARGINE 100 UNIT/ML SOLOSTAR PEN: 40.0000 [IU] | PEN_INJECTOR | Freq: Every day | SUBCUTANEOUS | Status: DC

## 2014-05-13 MED ORDER — INSULIN ASPART 100 UNIT/ML FLEXPEN: 12.0000 [IU] | PEN_INJECTOR | Freq: Three times a day (TID) | SUBCUTANEOUS | Status: DC

## 2014-05-13 MED ORDER — GLUCOSE BLOOD VI STRP: ORAL_STRIP | Status: DC

## 2014-05-13 NOTE — Progress Notes (Signed)
Patient ID: [name] [name], female   DOB: Dec 02, 1960, 53 y.o.   MRN: [id number]  HPI: [name] [name] is a 53 y.o.-year-old female, returning for DM2, dx 2005, insulin-dependent since 2005, uncontrolled, with complications (DR, PN). Last visit 1.5 mo ago.  Last hemoglobin A1c was: Lab Results  Component Value Date   HGBA1C 12.4* 03/28/2014   HGBA1C 15.2* 12/12/2012   HGBA1C 12.4* 04/29/2010   Pt was on a regimen of: - Metformin 1000 mg po bid >> now tolerates it fine - NovoLog 70/30 50 units in am and 35 units in pm - takes it before the meal, sometimes 15 min before - NovoLog 12 units at lunch - missing it frequently  At last visit I again advised to change to Lantus and NovoLog but she did not get it (again) >> she is only using NovoLog 12 units 3x a day - Metformin 1000 mg 2x a day with meals. - Lantus 50 units at bedtime - NovoLog  10 units for a smaller meal 14 units for a regular meal 16 units for a large meal - Sliding scale of NovoLog: - 150- 165: + 1 unit  - 166- 180: + 2 units  - 181- 195: + 3 units  - 196- 210: + 4 units  - > 210: + 5 units   Pt started checking her sugars - ran out of AccuCheck strips: - am: 130-200s >> 90-200 (highest CBG if she forgets the insulin) >> 100-180 - 2h after b'fast: n/c - before lunch: n/c - 2h after lunch: n/c - before dinner: n/c - bedtime: 200s >> 200-400 No lows <90. Highest sugar was 300; she has hypoglycemia awareness at 90.   Pt's meals are - smaller portions now: - Breakfast: cereal + 2% milk; egg + toast; - Lunch: bologna sandwich; burger; chicken box - Dinner: beef stew; baked chicken + collard greens or string beans; rice  - Snacks: fruit, chips, sometimes kit kat Occasional sodas - diet; water.   - no CKD, last BUN/creatinine:  Lab Results  Component Value Date   BUN 11 04/21/2013   CREATININE 0.51 04/21/2013  On Losartan. - last set of lipids: Lab Results  Component Value Date   CHOL 166 12/12/2012   HDL 63  12/12/2012   LDLCALC 86 12/12/2012   TRIG 86 12/12/2012   CHOLHDL 2.6 12/12/2012  Not on statins. - last eye exam was in 12/2013. + DR OU?Marland Kitchen Has cataracts OU. - + numbness, pain, and tingling in her feet. On Amitriptyline.   I reviewed pt's medications, allergies, PMH, social hx, family hx and no changes required, except as mentioned above.   ROS: Constitutional: no weight loss/gain, no fatigue, no subjective hyperthermia/hypothermia Eyes: no blurry vision, no xerophthalmia ENT: no sore throat, no nodules palpated in throat, no dysphagia/odynophagia, no hoarseness Cardiovascular: + CP - sharp, occasionally/+ SOB/no palpitations/+ leg swelling Respiratory: + cough/+ SOB Gastrointestinal: no N/V/D/C, + heartburn Musculoskeletal: no muscle/joint aches Skin: no rashes Neurological: no tremors/numbness/tingling/dizziness  PE: BP 124/78  Pulse 120  Temp(Src) 97.7 F (36.5 C) (Oral)  Resp 12  Wt 213 lb (96.616 kg)  SpO2 95%  LMP 04/01/2010 Wt Readings from Last 3 Encounters:  05/13/14 213 lb (96.616 kg)  03/28/14 215 lb (97.523 kg)  01/29/14 219 lb (99.338 kg)   Constitutional: obese, in NAD Eyes: PERRLA, EOMI, no exophthalmos ENT: moist mucous membranes, no thyromegaly, no cervical lymphadenopathy Cardiovascular: RRR, No MRG Respiratory: CTA B Gastrointestinal: abdomen soft, NT, ND, BS+ Musculoskeletal: no deformities,  strength intact in all 4 Skin: moist, warm, no rashes Neurological: no tremor with outstretched hands, DTR normal in all 4  ASSESSMENT: 1. 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 Patient with long-standing, uncontrolled diabetes, only on metformin + NovoLog (this is the second time that she did not take the recommended regimen). She did not change her regimen to basal-bolus insulin as I advised her at last visit, she did not remember we made the change... I advised her to start the suggested regimen. -  will send AccuChek strips to her pharmacy - We discussed about options for treatment, and I suggested to start the recommended regimen:  Please continue Metformin 1000 mg 2x a day with meals. - Start Lantus 40 units at bedtime - Start NovoLog  12 units for a smaller meal 14 units for a regular meal 16 units for a large meal - add the following Sliding scale of NovoLog: - 150- 165: + 1 unit  - 166- 180: + 2 units  - 181- 195: + 3 units  - 196- 210: + 4 units  - > 210: + 5 units  - continue checking sugars at different times of the day - check 3 times a day, rotating checks - advised for yearly eye exams - she is up to date - will give her the flu vaccine today - Return to clinic in 1 mo with sugar log

## 2014-05-13 NOTE — Patient Instructions (Signed)
Please continue Metformin 1000 mg 2x a day with meals. Start Lantus 40 units at bedtime Start NovoLog  12 units for a smaller meal 14 units for a regular meal 16 units for a large meal Add the following Sliding scale of NovoLog: - 150- 165: + 1 unit  - 166- 180: + 2 units  - 181- 195: + 3 units  - 196- 210: + 4 units  - > 210: + 5 units   Please return in 1 month with your sugar log.

## 2014-05-20 ENCOUNTER — Encounter: Payer: Self-pay | Admitting: Internal Medicine

## 2014-05-23 ENCOUNTER — Other Ambulatory Visit: Payer: Self-pay | Admitting: *Deleted

## 2014-05-23 MED ORDER — INSULIN ASPART 100 UNIT/ML FLEXPEN: 12.0000 [IU] | PEN_INJECTOR | Freq: Three times a day (TID) | SUBCUTANEOUS | Status: DC

## 2014-06-18 ENCOUNTER — Encounter: Payer: Self-pay | Admitting: Internal Medicine

## 2014-06-18 ENCOUNTER — Ambulatory Visit (INDEPENDENT_AMBULATORY_CARE_PROVIDER_SITE_OTHER): Payer: Medicaid Other | Admitting: Internal Medicine

## 2014-06-18 VITALS — BP 119/100 | HR 91 | Temp 98.2°F | Resp 16 | Ht 65.0 in | Wt 211.4 lb

## 2014-06-18 DIAGNOSIS — E11319 Type 2 diabetes mellitus with unspecified diabetic retinopathy without macular edema: Secondary | ICD-10-CM

## 2014-06-18 LAB — COMPREHENSIVE METABOLIC PANEL
ALBUMIN: 3.9 g/dL (ref 3.5–5.2)
ALT: 17 U/L (ref 0–35)
AST: 24 U/L (ref 0–37)
Alkaline Phosphatase: 57 U/L (ref 39–117)
BUN: 10 mg/dL (ref 6–23)
CALCIUM: 9.2 mg/dL (ref 8.4–10.5)
CO2: 23 mEq/L (ref 19–32)
CREATININE: 0.6 mg/dL (ref 0.4–1.2)
Chloride: 101 mEq/L (ref 96–112)
GFR: 124.66 mL/min (ref >60.00)
Glucose, Bld: 358 mg/dL — ABNORMAL HIGH (ref 70–99)
POTASSIUM: 3.9 meq/L (ref 3.5–5.1)
Sodium: 137 mEq/L (ref 135–145)
Total Bilirubin: 0.5 mg/dL (ref 0.2–1.2)
Total Protein: 7.5 g/dL (ref 6.0–8.3)

## 2014-06-18 LAB — LIPID PANEL
CHOL/HDL RATIO: 3
CHOLESTEROL: 125 mg/dL (ref 0–200)
HDL: 44.7 mg/dL (ref >39.00)
LDL Cholesterol: 61 mg/dL (ref 0–99)
NonHDL: 80.3
TRIGLYCERIDES: 98 mg/dL (ref 0.0–149.0)
VLDL: 19.6 mg/dL (ref 0.0–40.0)

## 2014-06-18 NOTE — Progress Notes (Signed)
Patient ID: [name] [name], female   DOB: Sep 22, 1960, 53 y.o.   MRN: [id number]  HPI: [name] [name] is a 53 y.o.-year-old female, returning for DM2, dx 2005, insulin-dependent since 2005, uncontrolled, with complications (DR, PN). Last visit 1.5 mo ago.  Her sister just died of a brain aneurysm and she is grieving.  Last hemoglobin A1c was: Lab Results  Component Value Date   HGBA1C 12.4* 03/28/2014   HGBA1C 15.2* 12/12/2012   HGBA1C 12.4* 04/29/2010   At last visit I again advised to change to Lantus and NovoLog but she did not get it as she was only using NovoLog 12 units 3x a day. She is now taking Lantus. - Metformin 1000 mg 2x a day with meals. - Lantus 40 units at bedtime - NovoLog mealtime insulin  - did not miss many doses 10 >> 12 (only took 10) units for a smaller meal 14 (only took 12 units) for a regular meal 16 (taking 20 units) units for a large meal - Sliding scale of NovoLog: - 150- 165: + 1 unit  - 166- 180: + 2 units  - 181- 195: + 3 units  - 196- 210: + 4 units  - > 210: + 5 units   Pt NOT checking her sugars - reviewed previous sugars: - am: 130-200s >> 90-200 (highest CBG if she forgets the insulin) >> 100-180 - 2h after b'fast: n/c - before lunch: n/c - 2h after lunch: n/c - before dinner: n/c - bedtime: 200s >> 200-400 Had 1 low (did not check); she has hypoglycemia awareness at 90.   AccuCheck meter.  Pt's meals are - smaller portions: - Breakfast: cereal + 2% milk; egg + toast; - Lunch: bologna sandwich; burger; chicken box - Dinner: beef stew; baked chicken + collard greens or string beans; rice  - Snacks: fruit, chips, sometimes kit kat Occasional sodas - diet; water.   - no CKD, last BUN/creatinine:  Lab Results  Component Value Date   BUN 11 04/21/2013   CREATININE 0.51 04/21/2013  On Losartan. - last set of lipids: Lab Results  Component Value Date   CHOL 166 12/12/2012   HDL 63 12/12/2012   LDLCALC 86 12/12/2012   TRIG 86  12/12/2012   CHOLHDL 2.6 12/12/2012  Not on statins. - last eye exam was in 12/2013. + DR OU?Marland Kitchen Has cataracts OU. - + numbness, pain, and tingling in her feet. On Amitriptyline.   I reviewed pt's medications, allergies, PMH, social hx, family hx and no changes required, except as mentioned above.   ROS: Constitutional: no weight loss/gain, no fatigue, no subjective hyperthermia/hypothermia Eyes: no blurry vision, no xerophthalmia ENT: no sore throat, no nodules palpated in throat, no dysphagia/odynophagia, no hoarseness Cardiovascular: no CP/SOB/no palpitations/ leg swelling Respiratory: no cough/SOB Gastrointestinal: no N/V/D/C, + heartburn Musculoskeletal: no muscle/joint aches Skin: no rashes, + itching Neurological: no tremors/numbness/tingling/dizziness  PE: BP 119/100 mmHg  Pulse 91  Temp(Src) 98.2 F (36.8 C)  Resp 16  Ht '5\' 5"'  (1.651 m)  Wt 211 lb 6.4 oz (95.89 kg)  BMI 35.18 kg/m2  SpO2 94%  LMP 04/01/2010 Wt Readings from Last 3 Encounters:  06/18/14 211 lb 6.4 oz (95.89 kg)  05/13/14 213 lb (96.616 kg)  03/28/14 215 lb (97.523 kg)   Constitutional: obese, in NAD Eyes: PERRLA, EOMI, no exophthalmos ENT: moist mucous membranes, no thyromegaly, no cervical lymphadenopathy Cardiovascular: RRR, No MRG Respiratory: CTA B Gastrointestinal: abdomen soft, NT, ND, BS+ Musculoskeletal: no deformities, strength intact in all  4 Skin: moist, warm, no rashes Neurological: no tremor with outstretched hands, DTR normal in all 4  ASSESSMENT: 1. 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 Patient with long-standing, uncontrolled diabetes, on metformin + basal-bolus insulin regimen, poorly compliant with med and CBG checks. She did not check sugars since last visit, but mentions she took her insulin... - I suggested to:  Patient Instructions  Please continue Metformin 1000 mg 2x a day with meals. Continue Lantus 40  units at bedtime Increase NovoLog doses as follows: 12 units for a smaller meal 14 units for a regular meal 20 units for a large meal Add the following Sliding scale of NovoLog: - 150- 165: + 1 unit  - 166- 180: + 2 units  - 181- 195: + 3 units  - 196- 210: + 4 units  - > 210: + 5 units  Check sugars at different times of the day - check 3 times a day, rotating checks - will check Lipids and CMP - advised for yearly eye exams - she is up to date - had flu vaccine this season - Return to clinic in 1.5 mo with sugar log   Office Visit on 06/18/2014  Component Date Value Ref Range Status  . Sodium 06/18/2014 137  135 - 145 mEq/L Final  . Potassium 06/18/2014 3.9  3.5 - 5.1 mEq/L Final  . Chloride 06/18/2014 101  96 - 112 mEq/L Final  . CO2 06/18/2014 23  19 - 32 mEq/L Final  . Glucose, Bld 06/18/2014 358* 70 - 99 mg/dL Final  . BUN 06/18/2014 10  6 - 23 mg/dL Final  . Creatinine, Ser 06/18/2014 0.6  0.4 - 1.2 mg/dL Final  . Total Bilirubin 06/18/2014 0.5  0.2 - 1.2 mg/dL Final  . Alkaline Phosphatase 06/18/2014 57  39 - 117 U/L Final  . AST 06/18/2014 24  0 - 37 U/L Final  . ALT 06/18/2014 17  0 - 35 U/L Final  . Total Protein 06/18/2014 7.5  6.0 - 8.3 g/dL Final  . Albumin 06/18/2014 3.9  3.5 - 5.2 g/dL Final  . Calcium 06/18/2014 9.2  8.4 - 10.5 mg/dL Final  . GFR 06/18/2014 124.66  >60.00 mL/min Final  . Cholesterol 06/18/2014 125  0 - 200 mg/dL Final   ATP III Classification       Desirable:  < 200 mg/dL               Borderline High:  200 - 239 mg/dL          High:  > = 240 mg/dL  . Triglycerides 06/18/2014 98.0  0.0 - 149.0 mg/dL Final   Normal:  <150 mg/dLBorderline High:  150 - 199 mg/dL  . HDL 06/18/2014 44.70  >39.00 mg/dL Final  . VLDL 06/18/2014 19.6  0.0 - 40.0 mg/dL Final  . LDL Cholesterol 06/18/2014 61  0 - 99 mg/dL Final  . Total CHOL/HDL Ratio 06/18/2014 3   Final                  Men          Women1/2 Average Risk     3.4          3.3Average Risk          5.0           4.42X Average Risk          9.6  7.13X Average Risk          15.0          11.0                      . NonHDL 06/18/2014 80.30   Final   NOTE:  Non-HDL goal should be 30 mg/dL higher than patient's LDL goal (i.e. LDL goal of < 70 mg/dL, would have non-HDL goal of < 100 mg/dL)   Glu very high... See plan above.

## 2014-06-18 NOTE — Patient Instructions (Signed)
Please continue Metformin 1000 mg 2x a day with meals. Continue Lantus 40 units at bedtime Increase NovoLog doses as follows: 12 units for a smaller meal 14 units for a regular meal 20 units for a large meal Add the following Sliding scale of NovoLog: - 150- 165: + 1 unit  - 166- 180: + 2 units  - 181- 195: + 3 units  - 196- 210: + 4 units  - > 210: + 5 units  Check sugars at different times of the day - check 3 times a day, rotating checks

## 2014-07-21 ENCOUNTER — Encounter (HOSPITAL_COMMUNITY): Payer: Self-pay | Admitting: Emergency Medicine

## 2014-07-21 ENCOUNTER — Emergency Department (HOSPITAL_COMMUNITY)
Admission: EM | Admit: 2014-07-21 | Discharge: 2014-07-22 | Disposition: A | Discharge status: Home / Self Care | Payer: Medicaid Other | Attending: Emergency Medicine | Admitting: Emergency Medicine

## 2014-07-21 DIAGNOSIS — Z8619 Personal history of other infectious and parasitic diseases: Secondary | ICD-10-CM | POA: Insufficient documentation

## 2014-07-21 DIAGNOSIS — Z862 Personal history of diseases of the blood and blood-forming organs and certain disorders involving the immune mechanism: Secondary | ICD-10-CM | POA: Insufficient documentation

## 2014-07-21 DIAGNOSIS — Z8614 Personal history of Methicillin resistant Staphylococcus aureus infection: Secondary | ICD-10-CM | POA: Insufficient documentation

## 2014-07-21 DIAGNOSIS — I10 Essential (primary) hypertension: Secondary | ICD-10-CM | POA: Insufficient documentation

## 2014-07-21 DIAGNOSIS — E119 Type 2 diabetes mellitus without complications: Secondary | ICD-10-CM | POA: Diagnosis not present

## 2014-07-21 DIAGNOSIS — R519 Headache, unspecified: Secondary | ICD-10-CM

## 2014-07-21 DIAGNOSIS — Z79899 Other long term (current) drug therapy: Secondary | ICD-10-CM | POA: Insufficient documentation

## 2014-07-21 DIAGNOSIS — Z794 Long term (current) use of insulin: Secondary | ICD-10-CM | POA: Diagnosis not present

## 2014-07-21 DIAGNOSIS — R51 Headache: Secondary | ICD-10-CM | POA: Insufficient documentation

## 2014-07-21 DIAGNOSIS — Z8742 Personal history of other diseases of the female genital tract: Secondary | ICD-10-CM | POA: Insufficient documentation

## 2014-07-21 LAB — URINALYSIS, ROUTINE W REFLEX MICROSCOPIC
Glucose, UA: >1000 mg/dL — AB
HGB URINE DIPSTICK: NEGATIVE
Ketones, ur: NEGATIVE mg/dL
Nitrite: NEGATIVE
Protein, ur: NEGATIVE mg/dL
SPECIFIC GRAVITY, URINE: 1.027 (ref 1.005–1.030)
UROBILINOGEN UA: 2 mg/dL — AB (ref 0.0–1.0)
pH: 5 (ref 5.0–8.0)

## 2014-07-21 LAB — URINE MICROSCOPIC-ADD ON

## 2014-07-21 MED ORDER — KETOROLAC TROMETHAMINE 30 MG/ML IJ SOLN
30.0000 mg | Freq: Once | INTRAMUSCULAR | Status: AC
  Administered 2014-07-21: 30 mg via INTRAVENOUS
  Filled 2014-07-21: qty 1

## 2014-07-21 MED ORDER — SODIUM CHLORIDE 0.9 % IV BOLUS (SEPSIS)
1000.0000 mL | Freq: Once | INTRAVENOUS | Status: AC
  Administered 2014-07-21: 1000 mL via INTRAVENOUS

## 2014-07-21 MED ORDER — DIPHENHYDRAMINE HCL 50 MG/ML IJ SOLN
25.0000 mg | Freq: Once | INTRAMUSCULAR | Status: AC
  Administered 2014-07-21: 25 mg via INTRAVENOUS
  Filled 2014-07-21: qty 1

## 2014-07-21 MED ORDER — FLUCONAZOLE 150 MG PO TABS
150.0000 mg | ORAL_TABLET | Freq: Once | ORAL | Status: AC
  Administered 2014-07-21: 150 mg via ORAL
  Filled 2014-07-21: qty 1

## 2014-07-21 MED ORDER — METOCLOPRAMIDE HCL 5 MG/ML IJ SOLN
10.0000 mg | Freq: Once | INTRAMUSCULAR | Status: AC
  Administered 2014-07-21: 10 mg via INTRAVENOUS
  Filled 2014-07-21: qty 2

## 2014-07-21 NOTE — ED Notes (Signed)
Patient resting in position of comfort with eyes closed RR WNL--even and unlabored with equal rise and fall of chest Patient in NAD Side rails up, call bell in reach  

## 2014-07-21 NOTE — ED Notes (Signed)
Patient with multiple complaints Patient c/o vaginal itching, headache, chronic back pain and chronic chest pain Patient alert and oriented x 4--No neuro deficits  Patient able to ambulate to bathroom without difficulty

## 2014-07-21 NOTE — Discharge Instructions (Signed)

## 2014-07-21 NOTE — ED Notes (Signed)
[place], PA at bedside 

## 2014-07-21 NOTE — ED Notes (Addendum)
Pt c/o migraine that started on Friday.  Pt denies n/v or visual disturbances.  Pt also states that she has vaginal itching that started yesterday. Pt has been putting Vagisil on the area.  Pt also c/o chest pain since yesterday that is intermittent but not hurting at this time.

## 2014-07-21 NOTE — ED Provider Notes (Signed)
CSN: [id number]     Arrival date & time 07/21/14  1737 History   First MD Initiated Contact with Patient 07/21/14 1915     Chief Complaint  Patient presents with  . Migraine  . Vaginal Itching     (Consider location/radiation/quality/duration/timing/severity/associated sxs/prior Treatment) HPI   54 year old female with history of insulin-dependent diabetes, prior CVA, urine fibroids, anemia presents with multiple complaints. Patient states for the past 3 days she has developed a gradual onset of sharp achy headache that is waxing waning but not fully resolved. Headache is primarily to her forehead worsening with certain movement. States she has headache in the past but this one is more intense. She denies any specific treatment tried. She also reports intermittent central chest pain lasting for seconds. She described pain as a sharp sensation, waxing waning. No active chest pain at this time. No associated lightheadedness dizziness shortness of breath or productive cough. She has been evaluated multiple times for chest pain in the past with negative workup. She also complaining of having chronic low back pain this is not out of the ordinary for her. Furthermore she also complaining of having vaginal itching for the past 2 days without a discharge. States she has history of yeast infection in the past. She tries using Vagisil with minimal relief. She denies having any vaginal bleeding or discomfort. Denies dysuria. She does have history of diabetes.  Past Medical History  Diagnosis Date  . Diabetes mellitus   . Hypertension   . Fibroids   . Anemia   . MRSA (methicillin resistant Staphylococcus aureus)   . Trichomonas    Past Surgical History  Procedure Laterality Date  . Cesarean section      breech   Family History  Problem Relation Age of Onset  . Other Neg Hx    History  Substance Use Topics  . Smoking status: Never Smoker   . Smokeless tobacco: Not on file  . Alcohol Use: Yes    OB History    Gravida Para Term Preterm AB TAB SAB Ectopic Multiple Living   1 1 1       1      Review of Systems  All other systems reviewed and are negative.     Allergies  Review of patient's allergies indicates no known allergies.  Home Medications   Prior to Admission medications   Medication Sig Start Date End Date Taking? Authorizing Provider  ACCU-CHEK SOFTCLIX LANCETS lancets Use to test blood sugar 3 times daily as instructed. Dx code: 250.50 04/01/14   Philemon Kingdom, MD  amitriptyline (ELAVIL) 25 MG tablet Take 25 mg by mouth at bedtime.    Historical Provider, MD  amLODipine (NORVASC) 5 MG tablet Take 5 mg by mouth daily.    Historical Provider, MD  glucose blood (ACCU-CHEK AVIVA PLUS) test strip Use to test blood sugar 3 times daily as instructed. Dx code: E11.319 05/13/14   Philemon Kingdom, MD  ibuprofen (ADVIL,MOTRIN) 600 MG tablet Take 600 mg by mouth every 8 (eight) hours as needed for pain.    Historical Provider, MD  insulin aspart (NOVOLOG FLEXPEN) 100 UNIT/ML FlexPen Inject 12-16 Units into the skin 3 (three) times daily with meals. Use sliding scale as instructed. 05/23/14   Philemon Kingdom, MD  insulin aspart protamine- aspart (NOVOLOG MIX 70/30) (70-30) 100 UNIT/ML injection Inject 60 Units into the skin daily with breakfast. 30 units at night.    Historical Provider, MD  Insulin Glargine (LANTUS SOLOSTAR) 100 UNIT/ML Solostar Pen  Inject 40 Units into the skin at bedtime. 05/13/14   Philemon Kingdom, MD  Insulin Pen Needle (BD PEN NEEDLE NANO U/F) 32G X 4 MM MISC Use to inject insulin 4 times daily. 05/13/14   Philemon Kingdom, MD  losartan-hydrochlorothiazide (HYZAAR) 100-12.5 MG per tablet Take 1 tablet by mouth daily.    Historical Provider, MD  metFORMIN (GLUCOPHAGE) 1000 MG tablet Take 1,000 mg by mouth 2 (two) times daily with a meal.    Historical Provider, MD  nitroGLYCERIN (NITROSTAT) 0.4 MG SL tablet Place 0.4 mg under the tongue every 5 (five)  minutes as needed for chest pain.    Historical Provider, MD  pantoprazole (PROTONIX) 40 MG tablet Take 40 mg by mouth daily.    Historical Provider, MD   BP 133/85 mmHg  Pulse 102  Temp(Src) 97.4 F (36.3 C) (Oral)  Resp 18  SpO2 98%  LMP 04/01/2010 Physical Exam  Constitutional: She is oriented to person, place, and time. She appears well-developed and well-nourished. No distress.  HENT:  Head: Atraumatic.  Mouth/Throat: Oropharynx is clear and moist.  Eyes: Conjunctivae and EOM are normal. Pupils are equal, round, and reactive to light.  Neck: Normal range of motion. Neck supple.  No nuchal rigidity  Cardiovascular: Normal rate and regular rhythm.   Pulmonary/Chest: Effort normal and breath sounds normal. She exhibits no tenderness.  Abdominal: Soft. There is no tenderness. There is no rebound and no guarding.  Musculoskeletal: She exhibits no edema.  Neurological: She is alert and oriented to person, place, and time.  Neurologic exam:  Speech clear, pupils equal round reactive to light, extraocular movements intact  Normal peripheral visual fields Cranial nerves III through XII normal including no facial droop Follows commands, moves all extremities x4, normal strength to bilateral upper and lower extremities at all major muscle groups including grip Sensation normal to light touch Coordination intact, no limb ataxia, finger-nose-finger normal Rapid alternating movements normal No pronator drift Gait normal   Skin: No rash noted.  Psychiatric: She has a normal mood and affect.  Nursing note and vitals reviewed.   ED Course  Procedures (including critical care time)  7:55 PM Patient with multiple complaints. Her primary complaint is a headache. No red flags. Plan to treat headache with migraine cocktail. She also complaining of vaginal itching similar to her prior yeast infection. Given her history of diabetes, I will give her treatment including Diflucan. She has  chronic chest pain with prior workup that was unremarkable and no active chest pain at this time.  11:09 PM Patient states she felt much better after receiving a migraine cocktail. She felt comfortable going home and follow-up with her doctor. She did receive a dose of Diflucan for her itchiness with a suspected yeast infection. Urine does not show any signs of urine tract infection. Her EKG shows no acute ischemic changes. She is stable for discharge. Return precautions discussed.  Labs Review Labs Reviewed  URINALYSIS, ROUTINE W REFLEX MICROSCOPIC - Abnormal; Notable for the following:    APPearance CLOUDY (*)    Glucose, UA >1000 (*)    Bilirubin Urine SMALL (*)    Urobilinogen, UA 2.0 (*)    Leukocytes, UA MODERATE (*)    All other components within normal limits  URINE MICROSCOPIC-ADD ON - Abnormal; Notable for the following:    Squamous Epithelial / LPF FEW (*)    Bacteria, UA MANY (*)    All other components within normal limits    Imaging Review No  results found.   EKG Interpretation None      Date: 07/21/2014  Rate: 99  Rhythm: normal sinus rhythm  QRS Axis: normal  Intervals: normal  ST/T Wave abnormalities: abnormal R-wave progression, early transition  Conduction Disutrbances:none  Narrative Interpretation: LVH  Old EKG Reviewed: unchanged ecg reviewed by me.   MDM   Final diagnoses:  Bad headache    BP 127/81 mmHg  Pulse 93  Temp(Src) 97.4 F (36.3 C) (Oral)  Resp 13  SpO2 91%  LMP 04/01/2010      Domenic Moras, PA-C 07/21/14 [place], MD 07/21/14 2322

## 2014-07-22 NOTE — ED Notes (Signed)

## 2014-07-25 ENCOUNTER — Telehealth: Payer: Self-pay | Admitting: Internal Medicine

## 2014-07-25 NOTE — Telephone Encounter (Signed)
Per PCP 

## 2014-07-25 NOTE — Telephone Encounter (Signed)
Patient would like a refill on her blood pressure medication    Please advise   Thank you

## 2014-07-25 NOTE — Telephone Encounter (Signed)
Please read note below and advise if ok to refill. Thank you.

## 2014-07-30 ENCOUNTER — Ambulatory Visit: Payer: Medicaid Other | Admitting: Internal Medicine

## 2014-10-31 ENCOUNTER — Ambulatory Visit: Payer: Medicaid Other | Admitting: Internal Medicine

## 2014-11-04 ENCOUNTER — Other Ambulatory Visit: Payer: Self-pay | Admitting: *Deleted

## 2014-11-04 ENCOUNTER — Ambulatory Visit (INDEPENDENT_AMBULATORY_CARE_PROVIDER_SITE_OTHER): Payer: Medicaid Other | Admitting: Internal Medicine

## 2014-11-04 ENCOUNTER — Encounter: Payer: Self-pay | Admitting: Internal Medicine

## 2014-11-04 DIAGNOSIS — E1139 Type 2 diabetes mellitus with other diabetic ophthalmic complication: Secondary | ICD-10-CM

## 2014-11-04 MED ORDER — INSULIN ASPART 100 UNIT/ML FLEXPEN: 14.0000 [IU] | PEN_INJECTOR | Freq: Three times a day (TID) | SUBCUTANEOUS | Status: DC

## 2014-11-04 NOTE — Telephone Encounter (Signed)
Opened encounter in error  

## 2014-11-04 NOTE — Progress Notes (Signed)
Patient ID: [name] [name], female   DOB: 1961-01-17, 54 y.o.   MRN: [id number]  HPI: [name] [name] is a 54 y.o.-year-old female, returning for DM2, dx 2005, insulin-dependent since 2005, uncontrolled, with complications (DR, PN). Last visit 4.5 mo ago (did not come back after 1.5 mo as advised)  Last hemoglobin A1c was: Lab Results  Component Value Date   HGBA1C 12.4* 03/28/2014   HGBA1C 15.2* 12/12/2012   HGBA1C 12.4* 04/29/2010   She is on: - Metformin 1000 mg 2x a day with meals. - Lantus 40 >> 50 units at bedtime - she ran out of insulin ~1 week ago - NovoLog mealtime insulin - missing doses 12 units for a smaller meal 14 units for a regular meal 20 units for a large meal - Sliding scale of NovoLog: - 150- 165: + 1 unit  - 166- 180: + 2 units  - 181- 195: + 3 units  - 196- 210: + 4 units  - > 210: + 5 units   Pt is checking her sugars sporadically: - am: 130-200s >> 90-200 (highest CBG if she forgets the insulin) >> 100-180 >> 200s - 2h after b'fast: n/c - before lunch: n/c - 2h after lunch: n/c - before dinner: n/c - bedtime: 200s >> 200-400 >> 300s Had 1 low (did not check); she has hypoglycemia awareness at 90.   AccuCheck meter.  Pt's meals are - smaller portions: - Breakfast: cereal + 2% milk; egg + toast; - Lunch: bologna sandwich; burger; chicken box - Dinner: beef stew; baked chicken + collard greens or string beans; rice  - Snacks: fruit, chips, sometimes kit kat Occasional sodas - diet; water.   - no CKD, last BUN/creatinine:  Lab Results  Component Value Date   BUN 10 06/18/2014   CREATININE 0.6 06/18/2014  On Losartan. - last set of lipids: Lab Results  Component Value Date   CHOL 125 06/18/2014   HDL 44.70 06/18/2014   LDLCALC 61 06/18/2014   TRIG 98.0 06/18/2014   CHOLHDL 3 06/18/2014  Not on statins. - last eye exam was in 12/2013. + DR OU?Marland Kitchen Has cataracts OU. - + numbness, pain, and tingling in her feet. On Amitriptyline.   I reviewed  pt's medications, allergies, PMH, social hx, family hx and no changes required, except as mentioned above.   ROS: Constitutional: + weight loss, no fatigue, + subjective hyperthermia Eyes: no blurry vision, no xerophthalmia ENT: no sore throat, no nodules palpated in throat, no dysphagia/odynophagia, no hoarseness Cardiovascular: + CP/+ SOB/no palpitations/+ leg swelling Respiratory: no cough/+ SOB Gastrointestinal: no N/V/D/C, no heartburn Musculoskeletal: no muscle/joint aches Skin: no rashes, + itching Neurological: no tremors/numbness/tingling/dizziness, + HA  PE: BP 128/88 mmHg  Pulse 116  Temp(Src) 98 F (36.7 C) (Oral)  Resp 12  Wt 206 lb 12.8 oz (93.804 kg)  SpO2 95%  LMP 04/01/2010 Wt Readings from Last 3 Encounters:  11/04/14 206 lb 12.8 oz (93.804 kg)  06/18/14 211 lb 6.4 oz (95.89 kg)  05/13/14 213 lb (96.616 kg)   Constitutional: obese, in NAD Eyes: PERRLA, EOMI, no exophthalmos ENT: moist mucous membranes, no thyromegaly, no cervical lymphadenopathy Cardiovascular: tachycardia, RR, No MRG Respiratory: CTA B Gastrointestinal: abdomen soft, NT, ND, BS+ Musculoskeletal: no deformities, strength intact in all 4 Skin: moist, warm, no rashes Neurological: no tremor with outstretched hands, DTR normal in all 4  ASSESSMENT: 1. 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 Patient with long-standing, uncontrolled diabetes,  on metformin + basal-bolus insulin regimen, poorly compliant with med and CBG checks. She does not bring a log or meter, and ran out of insulin 1 week ago. She is dehydrated, tachycardic, feeling hot, short of breath, and losing weight, all c/w glucotoxicity. I strongly advised her to NOT run out of insulin anymore (cost is not an issue - pays 6$ for each insulin) >> she will pick this up tonight. Advised her to get well hydrated. We also need to switch her to a simpler regimen as she does not  appear to do well with the current one. - I suggested to:  Patient Instructions  Please decrease the Lantus to 40 units at bedtime.  Change NovoLog to: - 14 units before a regular meal - 20 units before a large meal or if you have dessert  Please return in 1.5 months with your sugar log.   Please stop at the lab.  - Check sugars at different times of the day - check (2-)3 times a day, rotating checks - will check HbA1c today - advised for yearly eye exams - she is up to date - Return to clinic in 1.5 mo with sugar log   Office Visit on 11/04/2014  Component Date Value Ref Range Status  . Hgb A1c MFr Bld 11/04/2014 14.4* 4.6 - 6.5 % Final   Glycemic Control Guidelines for People with Diabetes:Non Diabetic:  <6%Goal of Therapy: <7%Additional Action Suggested:  >8%    hemoglobin A1c very high, as expected.

## 2014-11-04 NOTE — Patient Instructions (Signed)
Please decrease the Lantus to 40 units at bedtime.  Change NovoLog to: - 14 units before a regular meal - 20 units before a large meal or if you have dessert  Please return in 1.5 months with your sugar log.   Please stop at the lab.

## 2014-11-05 LAB — HEMOGLOBIN A1C: Hgb A1c MFr Bld: 14.4 % — ABNORMAL HIGH (ref 4.6–6.5)

## 2015-01-12 ENCOUNTER — Emergency Department (HOSPITAL_COMMUNITY)
Admission: EM | Admit: 2015-01-12 | Discharge: 2015-01-12 | Disposition: A | Discharge status: Home / Self Care | Payer: Medicaid Other | Attending: Emergency Medicine | Admitting: Emergency Medicine

## 2015-01-12 ENCOUNTER — Encounter (HOSPITAL_COMMUNITY): Payer: Self-pay | Admitting: Adult Health

## 2015-01-12 DIAGNOSIS — Z794 Long term (current) use of insulin: Secondary | ICD-10-CM | POA: Insufficient documentation

## 2015-01-12 DIAGNOSIS — Z8619 Personal history of other infectious and parasitic diseases: Secondary | ICD-10-CM | POA: Insufficient documentation

## 2015-01-12 DIAGNOSIS — Z862 Personal history of diseases of the blood and blood-forming organs and certain disorders involving the immune mechanism: Secondary | ICD-10-CM | POA: Insufficient documentation

## 2015-01-12 DIAGNOSIS — S3991XA Unspecified injury of abdomen, initial encounter: Secondary | ICD-10-CM | POA: Insufficient documentation

## 2015-01-12 DIAGNOSIS — Z79899 Other long term (current) drug therapy: Secondary | ICD-10-CM | POA: Insufficient documentation

## 2015-01-12 DIAGNOSIS — E119 Type 2 diabetes mellitus without complications: Secondary | ICD-10-CM | POA: Insufficient documentation

## 2015-01-12 DIAGNOSIS — Z86018 Personal history of other benign neoplasm: Secondary | ICD-10-CM | POA: Diagnosis not present

## 2015-01-12 DIAGNOSIS — X58XXXA Exposure to other specified factors, initial encounter: Secondary | ICD-10-CM | POA: Insufficient documentation

## 2015-01-12 DIAGNOSIS — T148XXA Other injury of unspecified body region, initial encounter: Secondary | ICD-10-CM

## 2015-01-12 DIAGNOSIS — Y93F2 Activity, caregiving, lifting: Secondary | ICD-10-CM | POA: Diagnosis not present

## 2015-01-12 DIAGNOSIS — I10 Essential (primary) hypertension: Secondary | ICD-10-CM | POA: Diagnosis not present

## 2015-01-12 DIAGNOSIS — Y9289 Other specified places as the place of occurrence of the external cause: Secondary | ICD-10-CM | POA: Insufficient documentation

## 2015-01-12 DIAGNOSIS — Y998 Other external cause status: Secondary | ICD-10-CM | POA: Insufficient documentation

## 2015-01-12 DIAGNOSIS — S299XXA Unspecified injury of thorax, initial encounter: Secondary | ICD-10-CM | POA: Diagnosis present

## 2015-01-12 DIAGNOSIS — Z8614 Personal history of Methicillin resistant Staphylococcus aureus infection: Secondary | ICD-10-CM | POA: Insufficient documentation

## 2015-01-12 DIAGNOSIS — S3992XA Unspecified injury of lower back, initial encounter: Secondary | ICD-10-CM | POA: Diagnosis not present

## 2015-01-12 DIAGNOSIS — T148 Other injury of unspecified body region: Secondary | ICD-10-CM | POA: Insufficient documentation

## 2015-01-12 MED ORDER — NAPROXEN 500 MG PO TABS: 500.0000 mg | ORAL_TABLET | Freq: Two times a day (BID) | ORAL | Status: DC

## 2015-01-12 MED ORDER — KETOROLAC TROMETHAMINE 60 MG/2ML IM SOLN
60.0000 mg | Freq: Once | INTRAMUSCULAR | Status: AC
  Administered 2015-01-12: 60 mg via INTRAMUSCULAR
  Filled 2015-01-12: qty 2

## 2015-01-12 MED ORDER — METHOCARBAMOL 500 MG PO TABS: 500.0000 mg | ORAL_TABLET | Freq: Two times a day (BID) | ORAL | Status: DC | PRN

## 2015-01-12 NOTE — Discharge Instructions (Signed)
Muscle Strain A muscle strain is an injury that occurs when a muscle is stretched beyond its normal length. Usually a small number of muscle fibers are torn when this happens. Muscle strain is rated in degrees. First-degree strains have the least amount of muscle fiber tearing and pain. Second-degree and third-degree strains have increasingly more tearing and pain.  Usually, recovery from muscle strain takes 1-2 weeks. Complete healing takes 5-6 weeks.  CAUSES  Muscle strain happens when a sudden, violent force placed on a muscle stretches it too far. This may occur with lifting, sports, or a fall.  RISK FACTORS Muscle strain is especially common in athletes.  SIGNS AND SYMPTOMS At the site of the muscle strain, there may be:  Pain.  Bruising.  Swelling.  Difficulty using the muscle due to pain or lack of normal function. DIAGNOSIS  Your health care provider will perform a physical exam and ask about your medical history. TREATMENT  Often, the best treatment for a muscle strain is resting, icing, and applying cold compresses to the injured area.  HOME CARE INSTRUCTIONS   Use the PRICE method of treatment to promote muscle healing during the first 2-3 days after your injury. The PRICE method involves:  Protecting the muscle from being injured again.  Restricting your activity and resting the injured body part.  Icing your injury. To do this, put ice in a plastic bag. Place a towel between your skin and the bag. Then, apply the ice and leave it on from 15-20 minutes each hour. After the third day, switch to moist heat packs.  Apply compression to the injured area with a splint or elastic bandage. Be careful not to wrap it too tightly. This may interfere with blood circulation or increase swelling.  Elevate the injured body part above the level of your heart as often as you can.  Only take over-the-counter or prescription medicines for pain, discomfort, or fever as directed by your  health care provider.  Warming up prior to exercise helps to prevent future muscle strains. SEEK MEDICAL CARE IF:   You have increasing pain or swelling in the injured area.  You have numbness, tingling, or a significant loss of strength in the injured area. MAKE SURE YOU:   Understand these instructions.  Will watch your condition.  Will get help right away if you are not doing well or get worse. Document Released: 07/05/2005 Document Revised: 04/25/2013 Document Reviewed: 02/01/2013 [facility] Patient Information 2015 [place], [place]. This information is not intended to replace advice given to you by your health care provider. Make sure you discuss any questions you have with your health care provider.  Please call your doctor for a followup appointment within 24-48 hours. When you talk to your doctor please let them know that you were seen in the emergency department and have them acquire all of your records so that they can discuss the findings with you and formulate a treatment plan to fully care for your new and ongoing problems.

## 2015-01-12 NOTE — ED Notes (Signed)
No answer x2 

## 2015-01-12 NOTE — ED Notes (Addendum)
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 Pt also c/o sternal generalized chest pain that began this am and comes and goes, described as "catching" denies SOB and nausea.  Tylenol not helping.

## 2015-01-12 NOTE — ED Provider Notes (Signed)
CSN: [id number]     Arrival date & time 01/12/15  0719 History   First MD Initiated Contact with Patient 01/12/15 [phone]     Chief Complaint  Patient presents with  . Chest Pain  . 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 The patient states that this caused acute onset of pain in the left flank, this is a constant pain but is worse with moving or stretching that muscle, it is also associated with a left-sided chest pain which she states is chronic but seemed to get worse after doing that activity as well. She denies any exertional symptoms at baseline, has not had any fevers chills nausea vomiting diarrhea or difficulty urinating. She has no numbness or weakness of her 4 extremities. She has had no pain medications prior to arrival.  Patient is a 54 y.o. female presenting with chest pain and musculoskeletal pain. The history is provided by the patient.  Chest Pain Muscle Pain Associated symptoms include chest pain.    Past Medical History  Diagnosis Date  . Diabetes mellitus   . Hypertension   . Fibroids   . Anemia   . MRSA (methicillin resistant Staphylococcus aureus)   . Trichomonas    Past Surgical History  Procedure Laterality Date  . Cesarean section      breech   Family History  Problem Relation Age of Onset  . Other Neg Hx    History  Substance Use Topics  . Smoking status: Never Smoker   . Smokeless tobacco: Not on file  . Alcohol Use: Yes   OB History    Gravida Para Term Preterm AB TAB SAB Ectopic Multiple Living   1 1 1       1      Review of Systems  Cardiovascular: Positive for chest pain.  All other systems reviewed and are  negative.     Allergies  Review of patient's allergies indicates no known allergies.  Home Medications   Prior to Admission medications   Medication Sig Start Date End Date Taking? Authorizing Provider  ACCU-CHEK SOFTCLIX LANCETS lancets Use to test blood sugar 3 times daily as instructed. Dx code: 250.50 04/01/14   Philemon Kingdom, MD  amitriptyline (ELAVIL) 25 MG tablet Take 25 mg by mouth at bedtime.    Historical Provider, MD  amLODipine (NORVASC) 5 MG tablet Take 5 mg by mouth daily.    Historical Provider, MD  glucose blood (ACCU-CHEK AVIVA PLUS) test strip Use to test blood sugar 3 times daily as instructed. Dx code: E11.319 05/13/14   Philemon Kingdom, MD  ibuprofen (ADVIL,MOTRIN) 600 MG tablet Take 600 mg by mouth every 8 (eight) hours as needed for pain.    Historical Provider, MD  insulin aspart (NOVOLOG FLEXPEN) 100 UNIT/ML FlexPen Inject 14-20 Units into the skin 3 (three) times daily with meals. Use sliding scale as instructed. 11/04/14   Philemon Kingdom, MD  insulin aspart protamine- aspart (NOVOLOG MIX 70/30) (70-30) 100 UNIT/ML injection Inject 60 Units into the skin daily with breakfast. 30 units at night.    Historical Provider, MD  Insulin Glargine (LANTUS SOLOSTAR) 100 UNIT/ML Solostar Pen Inject 40 Units into the skin at bedtime. 05/13/14   Philemon Kingdom, MD  Insulin Pen Needle (BD PEN NEEDLE NANO U/F) 32G X 4 MM MISC Use to inject insulin 4 times daily. 05/13/14   Philemon Kingdom, MD  losartan-hydrochlorothiazide (HYZAAR) 100-12.5 MG per tablet Take 1 tablet by mouth daily.    Historical Provider, MD  metFORMIN (GLUCOPHAGE) 1000 MG tablet Take 1,000 mg by mouth 2 (two) times daily with a meal.    Historical Provider, MD  methocarbamol (ROBAXIN) 500 MG tablet Take 1 tablet (500 mg total) by mouth 2 (two) times daily as needed for muscle spasms. 01/12/15   Noemi Chapel, MD  naproxen (NAPROSYN) 500 MG tablet Take 1 tablet (500 mg total) by mouth 2 (two) times daily with  a meal. 01/12/15   Noemi Chapel, MD  nitroGLYCERIN (NITROSTAT) 0.4 MG SL tablet Place 0.4 mg under the tongue every 5 (five) minutes as needed for chest pain.    Historical Provider, MD  pantoprazole (PROTONIX) 40 MG tablet Take 40 mg by mouth daily.    Historical Provider, MD   BP 133/86 mmHg  Pulse 115  Temp(Src) 98.1 F (36.7 C) (Oral)  Resp 20  SpO2 97%  LMP 04/01/2010 Physical Exam  Constitutional: She appears well-developed and well-nourished. No distress.  HENT:  Head: Normocephalic and atraumatic.  Mouth/Throat: Oropharynx is clear and moist. No oropharyngeal exudate.  Eyes: Conjunctivae and EOM are normal. Pupils are equal, round, and reactive to light. Right eye exhibits no discharge. Left eye exhibits no discharge. No scleral icterus.  Neck: Normal range of motion. Neck supple. No JVD present. No thyromegaly present.  Cardiovascular: Normal rate, regular rhythm, normal heart sounds and intact distal pulses.  Exam reveals no gallop and no friction rub.   No murmur heard. Pulmonary/Chest: Effort normal and breath sounds normal. No respiratory distress. She has no wheezes. She has no rales. She exhibits tenderness (left-sided lower rib).  Abdominal: Soft. Bowel sounds are normal. She exhibits no distension and no mass. There is no tenderness.  Musculoskeletal: Normal range of motion. She exhibits tenderness (left lower back and flank tenderness to palpation and with movement). She exhibits no edema.  Lymphadenopathy:    She has no cervical adenopathy.  Neurological: She is alert. Coordination normal.  Skin: Skin is warm and dry. No rash noted. No erythema.  Psychiatric: She has a normal mood and affect. Her behavior is normal.  Nursing note and vitals reviewed.   ED Course  Procedures (including critical care time) Labs Review Labs Reviewed - No data to display  Imaging Review No results found.   EKG Interpretation   Date/Time:  Sunday January 12 2015 07:44:35  EDT Ventricular Rate:  112 PR Interval:  156 QRS Duration: 86 QT Interval:  356 QTC Calculation: 485 R Axis:   15 Text Interpretation:  Sinus tachycardia Left ventricular hypertrophy  Abnormal ECG Since last tracing EARLY R WAVE PROGRESSION no longer seen  Confirmed by Kylene Zamarron  MD, Rondi Ivy ([id number]) on 01/12/2015 7:50:08 AM      MDM   Final diagnoses:  Muscle strain    The patient has an EKG which is essentially unchanged from prior EKGs, there is no acute ischemia found, her chest pain and left back pain started acutely when she tried to lift a patient. They are both reproducible with movement and palpation, at this time she will be treated for muscle strain, she will be kept out of work for the next 36 hours, encourage anti-inflammatory therapy, start with intramuscular Toradol, home with medications, the patient has been encouraged to return  for follow-up to her doctor for a recheck if not improved, she is agreeable to the plan.  Meds given in ED:  Medications  ketorolac (TORADOL) injection 60 mg (not administered)    New Prescriptions   METHOCARBAMOL (ROBAXIN) 500 MG TABLET    Take 1 tablet (500 mg total) by mouth 2 (two) times daily as needed for muscle spasms.   NAPROXEN (NAPROSYN) 500 MG TABLET    Take 1 tablet (500 mg total) by mouth 2 (two) times daily with a meal.        Noemi Chapel, MD 01/12/15 0800

## 2015-01-26 ENCOUNTER — Emergency Department (HOSPITAL_COMMUNITY)
Admission: EM | Admit: 2015-01-26 | Discharge: 2015-01-26 | Disposition: A | Discharge status: Home / Self Care | Payer: Medicaid Other | Attending: Emergency Medicine | Admitting: Emergency Medicine

## 2015-01-26 ENCOUNTER — Encounter (HOSPITAL_COMMUNITY): Payer: Self-pay | Admitting: Emergency Medicine

## 2015-01-26 DIAGNOSIS — N9089 Other specified noninflammatory disorders of vulva and perineum: Secondary | ICD-10-CM

## 2015-01-26 DIAGNOSIS — E1165 Type 2 diabetes mellitus with hyperglycemia: Secondary | ICD-10-CM | POA: Diagnosis not present

## 2015-01-26 DIAGNOSIS — A5901 Trichomonal vulvovaginitis: Secondary | ICD-10-CM | POA: Insufficient documentation

## 2015-01-26 DIAGNOSIS — Z79899 Other long term (current) drug therapy: Secondary | ICD-10-CM | POA: Insufficient documentation

## 2015-01-26 DIAGNOSIS — Z794 Long term (current) use of insulin: Secondary | ICD-10-CM | POA: Insufficient documentation

## 2015-01-26 DIAGNOSIS — Z862 Personal history of diseases of the blood and blood-forming organs and certain disorders involving the immune mechanism: Secondary | ICD-10-CM | POA: Diagnosis not present

## 2015-01-26 DIAGNOSIS — Z86018 Personal history of other benign neoplasm: Secondary | ICD-10-CM | POA: Diagnosis not present

## 2015-01-26 DIAGNOSIS — Z8614 Personal history of Methicillin resistant Staphylococcus aureus infection: Secondary | ICD-10-CM | POA: Diagnosis not present

## 2015-01-26 DIAGNOSIS — R739 Hyperglycemia, unspecified: Secondary | ICD-10-CM

## 2015-01-26 DIAGNOSIS — L293 Anogenital pruritus, unspecified: Secondary | ICD-10-CM | POA: Diagnosis present

## 2015-01-26 DIAGNOSIS — I10 Essential (primary) hypertension: Secondary | ICD-10-CM | POA: Diagnosis not present

## 2015-01-26 DIAGNOSIS — Z791 Long term (current) use of non-steroidal anti-inflammatories (NSAID): Secondary | ICD-10-CM | POA: Diagnosis not present

## 2015-01-26 DIAGNOSIS — E669 Obesity, unspecified: Secondary | ICD-10-CM | POA: Insufficient documentation

## 2015-01-26 LAB — I-STAT CHEM 8, ED
BUN: 17 mg/dL (ref 6–20)
CHLORIDE: 92 mmol/L — AB (ref 101–111)
Calcium, Ion: 1.2 mmol/L (ref 1.12–1.23)
Creatinine, Ser: 0.5 mg/dL (ref 0.44–1.00)
GLUCOSE: 539 mg/dL — AB (ref 65–99)
HEMATOCRIT: 40 % (ref 36.0–46.0)
Hemoglobin: 13.6 g/dL (ref 12.0–15.0)
Potassium: 4.1 mmol/L (ref 3.5–5.1)
Sodium: 132 mmol/L — ABNORMAL LOW (ref 135–145)
TCO2: 22 mmol/L (ref 0–100)

## 2015-01-26 LAB — URINALYSIS, ROUTINE W REFLEX MICROSCOPIC
Bilirubin Urine: NEGATIVE
Glucose, UA: >1000 mg/dL — AB
Hgb urine dipstick: NEGATIVE
KETONES UR: 15 mg/dL — AB
LEUKOCYTES UA: NEGATIVE
NITRITE: NEGATIVE
PH: 5 (ref 5.0–8.0)
Protein, ur: NEGATIVE mg/dL
Specific Gravity, Urine: 1.035 — ABNORMAL HIGH (ref 1.005–1.030)
UROBILINOGEN UA: 1 mg/dL (ref 0.0–1.0)

## 2015-01-26 LAB — I-STAT CG4 LACTIC ACID, ED: LACTIC ACID, VENOUS: 0.87 mmol/L (ref 0.5–2.0)

## 2015-01-26 LAB — BASIC METABOLIC PANEL
Anion gap: 10 (ref 5–15)
BUN: 12 mg/dL (ref 6–20)
CO2: 26 mmol/L (ref 22–32)
CREATININE: 0.73 mg/dL (ref 0.44–1.00)
Calcium: 9 mg/dL (ref 8.9–10.3)
Chloride: 94 mmol/L — ABNORMAL LOW (ref 101–111)
Glucose, Bld: 494 mg/dL — ABNORMAL HIGH (ref 65–99)
Potassium: 4 mmol/L (ref 3.5–5.1)
SODIUM: 130 mmol/L — AB (ref 135–145)

## 2015-01-26 LAB — CBC
HEMATOCRIT: 34.6 % — AB (ref 36.0–46.0)
HEMOGLOBIN: 11.4 g/dL — AB (ref 12.0–15.0)
MCH: 26.2 pg (ref 26.0–34.0)
MCHC: 32.9 g/dL (ref 30.0–36.0)
MCV: 79.5 fL (ref 78.0–100.0)
PLATELETS: 295 10*3/uL (ref 150–400)
RBC: 4.35 MIL/uL (ref 3.87–5.11)
RDW: 13.5 % (ref 11.5–15.5)
WBC: 3.5 10*3/uL — AB (ref 4.0–10.5)

## 2015-01-26 LAB — WET PREP, GENITAL
Clue Cells Wet Prep HPF POC: NONE SEEN
Yeast Wet Prep HPF POC: NONE SEEN

## 2015-01-26 LAB — CBG MONITORING, ED
GLUCOSE-CAPILLARY: 436 mg/dL — AB (ref 65–99)
GLUCOSE-CAPILLARY: 352 mg/dL — AB (ref 65–99)
Glucose-Capillary: 550 mg/dL — ABNORMAL HIGH (ref 65–99)

## 2015-01-26 LAB — URINE MICROSCOPIC-ADD ON

## 2015-01-26 MED ORDER — DEXTROSE-NACL 5-0.45 % IV SOLN: INTRAVENOUS | Status: DC

## 2015-01-26 MED ORDER — SODIUM CHLORIDE 0.9 % IV BOLUS (SEPSIS)
1000.0000 mL | Freq: Once | INTRAVENOUS | Status: AC
  Administered 2015-01-26: 1000 mL via INTRAVENOUS

## 2015-01-26 MED ORDER — NYSTATIN 100000 UNIT/GM EX POWD: Freq: Three times a day (TID) | CUTANEOUS | Status: DC

## 2015-01-26 MED ORDER — INSULIN REGULAR BOLUS VIA INFUSION
0.0000 [IU] | Freq: Three times a day (TID) | INTRAVENOUS | Status: DC
  Filled 2015-01-26: qty 10

## 2015-01-26 MED ORDER — SODIUM CHLORIDE 0.9 % IV SOLN
INTRAVENOUS | Status: DC
  Administered 2015-01-26: 12:00:00 via INTRAVENOUS

## 2015-01-26 MED ORDER — METRONIDAZOLE 500 MG PO TABS: 500.0000 mg | ORAL_TABLET | Freq: Two times a day (BID) | ORAL | Status: DC

## 2015-01-26 MED ORDER — DEXTROSE 50 % IV SOLN: 25.0000 mL | INTRAVENOUS | Status: DC | PRN

## 2015-01-26 MED ORDER — SODIUM CHLORIDE 0.9 % IV SOLN
INTRAVENOUS | Status: DC
  Administered 2015-01-26: 3.8 [IU]/h via INTRAVENOUS
  Filled 2015-01-26: qty 2.5

## 2015-01-26 NOTE — ED Notes (Signed)
Pelvic cart set up at bedside  

## 2015-01-26 NOTE — Discharge Instructions (Signed)
Apply nystatin powder as directed to the area you're irritated.  Take Flagyl twice daily as directed.  Trichomonas is a sexually-transmitted disease, and you are obligated to inform your partner of this diagnosis for treatment.  It is very important for you to closely monitor your blood sugar and watch her diet.  Take your diabetes medications as prescribed.  Follow-up with your primary care physician as soon as possible.  Hyperglycemia Hyperglycemia occurs when the glucose (sugar) in your blood is too high. Hyperglycemia can happen for many reasons, but it most often happens to people who do not know they have diabetes or are not managing their diabetes properly.  CAUSES  Whether you have diabetes or not, there are other causes of hyperglycemia. Hyperglycemia can occur when you have diabetes, but it can also occur in other situations that you might not be as aware of, such as: Diabetes  If you have diabetes and are having problems controlling your blood glucose, hyperglycemia could occur because of some of the following reasons:  Not following your meal plan.  Not taking your diabetes medications or not taking it properly.  Exercising less or doing less activity than you normally do.  Being sick. Pre-diabetes  This cannot be ignored. Before people develop Type 2 diabetes, they almost always have "pre-diabetes." This is when your blood glucose levels are higher than normal, but not yet high enough to be diagnosed as diabetes. Research has shown that some long-term damage to the body, especially the heart and circulatory system, may already be occurring during pre-diabetes. If you take action to manage your blood glucose when you have pre-diabetes, you may delay or prevent Type 2 diabetes from developing. Stress  If you have diabetes, you may be "diet" controlled or on oral medications or insulin to control your diabetes. However, you may find that your blood glucose is higher than usual in the  hospital whether you have diabetes or not. This is often referred to as "stress hyperglycemia." Stress can elevate your blood glucose. This happens because of hormones put out by the body during times of stress. If stress has been the cause of your high blood glucose, it can be followed regularly by your caregiver. That way he/she can make sure your hyperglycemia does not continue to get worse or progress to diabetes. Steroids  Steroids are medications that act on the infection fighting system (immune system) to block inflammation or infection. One side effect can be a rise in blood glucose. Most people can produce enough extra insulin to allow for this rise, but for those who cannot, steroids make blood glucose levels go even higher. It is not unusual for steroid treatments to "uncover" diabetes that is developing. It is not always possible to determine if the hyperglycemia will go away after the steroids are stopped. A special blood test called an A1c is sometimes done to determine if your blood glucose was elevated before the steroids were started. SYMPTOMS  Thirsty.  Frequent urination.  Dry mouth.  Blurred vision.  Tired or fatigue.  Weakness.  Sleepy.  Tingling in feet or leg. DIAGNOSIS  Diagnosis is made by monitoring blood glucose in one or all of the following ways:  A1c test. This is a chemical found in your blood.  Fingerstick blood glucose monitoring.  Laboratory results. TREATMENT  First, knowing the cause of the hyperglycemia is important before the hyperglycemia can be treated. Treatment may include, but is not be limited to:  Education.  Change or adjustment  in medications.  Change or adjustment in meal plan.  Treatment for an illness, infection, etc.  More frequent blood glucose monitoring.  Change in exercise plan.  Decreasing or stopping steroids.  Lifestyle changes. HOME CARE INSTRUCTIONS   Test your blood glucose as directed.  Exercise  regularly. Your caregiver will give you instructions about exercise. Pre-diabetes or diabetes which comes on with stress is helped by exercising.  Eat wholesome, balanced meals. Eat often and at regular, fixed times. Your caregiver or nutritionist will give you a meal plan to guide your sugar intake.  Being at an ideal weight is important. If needed, losing as little as 10 to 15 pounds may help improve blood glucose levels. SEEK MEDICAL CARE IF:   You have questions about medicine, activity, or diet.  You continue to have symptoms (problems such as increased thirst, urination, or weight gain). SEEK IMMEDIATE MEDICAL CARE IF:   You are vomiting or have diarrhea.  Your breath smells fruity.  You are breathing faster or slower.  You are very sleepy or incoherent.  You have numbness, tingling, or pain in your feet or hands.  You have chest pain.  Your symptoms get worse even though you have been following your caregiver's orders.  If you have any other questions or concerns. Document Released: 12/29/2000 Document Revised: 09/27/2011 Document Reviewed: 11/01/2011 [facility] Patient Information 2015 [place], [place]. This information is not intended to replace advice given to you by your health care provider. Make sure you discuss any questions you have with your health care provider.  Trichomoniasis Trichomoniasis is an infection caused by an organism called Trichomonas. The infection can affect both women and men. In women, the outer female genitalia and the vagina are affected. In men, the penis is mainly affected, but the prostate and other reproductive organs can also be involved. Trichomoniasis is a sexually transmitted infection (STI) and is most often passed to another person through sexual contact.  RISK FACTORS  Having unprotected sexual intercourse.  Having sexual intercourse with an infected partner. SIGNS AND SYMPTOMS  Symptoms of trichomoniasis in women include:  Abnormal  gray-green frothy vaginal discharge.  Itching and irritation of the vagina.  Itching and irritation of the area outside the vagina. Symptoms of trichomoniasis in men include:   Penile discharge with or without pain.  Pain during urination. This results from inflammation of the urethra. DIAGNOSIS  Trichomoniasis may be found during a Pap test or physical exam. Your health care provider may use one of the following methods to help diagnose this infection:  Examining vaginal discharge under a microscope. For men, urethral discharge would be examined.  Testing the pH of the vagina with a test tape.  Using a vaginal swab test that checks for the Trichomonas organism. A test is available that provides results within a few minutes.  Doing a culture test for the organism. This is not usually needed. TREATMENT   You may be given medicine to fight the infection. Women should inform their health care provider if they could be or are pregnant. Some medicines used to treat the infection should not be taken during pregnancy.  Your health care provider may recommend over-the-counter medicines or creams to decrease itching or irritation.  Your sexual partner will need to be treated if infected. HOME CARE INSTRUCTIONS   Take medicines only as directed by your health care provider.  Take over-the-counter medicine for itching or irritation as directed by your health care provider.  Do not have sexual intercourse  while you have the infection.  Women should not douche or wear tampons while they have the infection.  Discuss your infection with your partner. Your partner may have gotten the infection from you, or you may have gotten it from your partner.  Have your sex partner get examined and treated if necessary.  Practice safe, informed, and protected sex.  See your health care provider for other STI testing. SEEK MEDICAL CARE IF:   You still have symptoms after you finish your  medicine.  You develop abdominal pain.  You have pain when you urinate.  You have bleeding after sexual intercourse.  You develop a rash.  Your medicine makes you sick or makes you throw up (vomit). MAKE SURE YOU:  Understand these instructions.  Will watch your condition.  Will get help right away if you are not doing well or get worse. Document Released: 12/29/2000 Document Revised: 11/19/2013 Document Reviewed: 04/16/2013 [facility] Patient Information 2015 [place], [place]. This information is not intended to replace advice given to you by your health care provider. Make sure you discuss any questions you have with your health care provider.  Vaginitis Vaginitis is an inflammation of the vagina. It is most often caused by a change in the normal balance of the bacteria and yeast that live in the vagina. This change in balance causes an overgrowth of certain bacteria or yeast, which causes the inflammation. There are different types of vaginitis, but the most common types are:  Bacterial vaginosis.  Yeast infection (candidiasis).  Trichomoniasis vaginitis. This is a sexually transmitted infection (STI).  Viral vaginitis.  Atropic vaginitis.  Allergic vaginitis. CAUSES  The cause depends on the type of vaginitis. Vaginitis can be caused by:  Bacteria (bacterial vaginosis).  Yeast (yeast infection).  A parasite (trichomoniasis vaginitis)  A virus (viral vaginitis).  Low hormone levels (atrophic vaginitis). Low hormone levels can occur during pregnancy, breastfeeding, or after menopause.  Irritants, such as bubble baths, scented tampons, and feminine sprays (allergic vaginitis). Other factors can change the normal balance of the yeast and bacteria that live in the vagina. These include:  Antibiotic medicines.  Poor hygiene.  Diaphragms, vaginal sponges, spermicides, birth control pills, and intrauterine devices (IUD).  Sexual  intercourse.  Infection.  Uncontrolled diabetes.  A weakened immune system. SYMPTOMS  Symptoms can vary depending on the cause of the vaginitis. Common symptoms include:  Abnormal vaginal discharge.  The discharge is white, gray, or yellow with bacterial vaginosis.  The discharge is thick, white, and cheesy with a yeast infection.  The discharge is frothy and yellow or greenish with trichomoniasis.  A bad vaginal odor.  The odor is fishy with bacterial vaginosis.  Vaginal itching, pain, or swelling.  Painful intercourse.  Pain or burning when urinating. Sometimes, there are no symptoms. TREATMENT  Treatment will vary depending on the type of infection.   Bacterial vaginosis and trichomoniasis are often treated with antibiotic creams or pills.  Yeast infections are often treated with antifungal medicines, such as vaginal creams or suppositories.  Viral vaginitis has no cure, but symptoms can be treated with medicines that relieve discomfort. Your sexual partner should be treated as well.  Atrophic vaginitis may be treated with an estrogen cream, pill, suppository, or vaginal ring. If vaginal dryness occurs, lubricants and moisturizing creams may help. You may be told to avoid scented soaps, sprays, or douches.  Allergic vaginitis treatment involves quitting the use of the product that is causing the problem. Vaginal creams can be used  to treat the symptoms. HOME CARE INSTRUCTIONS   Take all medicines as directed by your caregiver.  Keep your genital area clean and dry. Avoid soap and only rinse the area with water.  Avoid douching. It can remove the healthy bacteria in the vagina.  Do not use tampons or have sexual intercourse until your vaginitis has been treated. Use sanitary pads while you have vaginitis.  Wipe from front to back. This avoids the spread of bacteria from the rectum to the vagina.  Let air reach your genital area.  Wear cotton underwear to  decrease moisture buildup.  Avoid wearing underwear while you sleep until your vaginitis is gone.  Avoid tight pants and underwear or nylons without a cotton panel.  Take off wet clothing (especially bathing suits) as soon as possible.  Use mild, non-scented products. Avoid using irritants, such as:  Scented feminine sprays.  Fabric softeners.  Scented detergents.  Scented tampons.  Scented soaps or bubble baths.  Practice safe sex and use condoms. Condoms may prevent the spread of trichomoniasis and viral vaginitis. SEEK MEDICAL CARE IF:   You have abdominal pain.  You have a fever or persistent symptoms for more than 2-3 days.  You have a fever and your symptoms suddenly get worse. Document Released: 05/02/2007 Document Revised: 03/29/2012 Document Reviewed: 12/16/2011 [facility] Patient Information 2015 [place], [place]. This information is not intended to replace advice given to you by your health care provider. Make sure you discuss any questions you have with your health care provider.

## 2015-01-26 NOTE — ED Notes (Signed)
PT given cup for U/A but states she had to void so badly that she missed the cup.

## 2015-01-26 NOTE — ED Notes (Signed)
Phlebotomy called about obtaining lab.

## 2015-01-26 NOTE — ED Notes (Addendum)
Pt reports vaginal itching since Saturday. Reports she thinks her labia might be swollen as well. Denies vaginal discharge. Denies painful urination. Reports last sexual activity over a year ago. Tried OTC vagisil with no relief.Pt also reports she has not been eating right with her diabetes and reports her feet get swollen and itchy sometimes.

## 2015-01-26 NOTE — ED Provider Notes (Signed)
CSN: [id number]     Arrival date & time 01/26/15  0756 History   First MD Initiated Contact with Patient 01/26/15 0801     Chief Complaint  Patient presents with  . Vaginal Itching     (Consider location/radiation/quality/duration/timing/severity/associated sxs/prior Treatment) HPI Comments: 54 year old female presenting with vaginal itching and irritation 2 days.  She has tried using over-the-counter vaginal cell with mild temporary relief.  Denies vaginal bleeding or discharge.  Denies any urinary symptoms.  Believes this is from uncontrolled blood sugar as it has been "up and down" from the 200s to the 400s.  States her diabetes is poorly controlled due to her poor diet of many doughnuts.  Denies abdominal pain, nausea, vomiting, diarrhea, or fever. Denies dry mouth, dizziness, lightheadedness.  Patient is a 54 y.o. female presenting with vaginal itching. The history is provided by the patient.  Vaginal Itching    Past Medical History  Diagnosis Date  . Diabetes mellitus   . Hypertension   . Fibroids   . Anemia   . MRSA (methicillin resistant Staphylococcus aureus)   . Trichomonas    Past Surgical History  Procedure Laterality Date  . Cesarean section      breech   Family History  Problem Relation Age of Onset  . Other Neg Hx    History  Substance Use Topics  . Smoking status: Never Smoker   . Smokeless tobacco: Not on file  . Alcohol Use: Yes   OB History    Gravida Para Term Preterm AB TAB SAB Ectopic Multiple Living   1 1 1       1      Review of Systems  Genitourinary:       + Vaginal itching and irritation.  All other systems reviewed and are negative.     Allergies  Review of patient's allergies indicates no known allergies.  Home Medications   Prior to Admission medications   Medication Sig Start Date End Date Taking? Authorizing Provider  ACCU-CHEK SOFTCLIX LANCETS lancets Use to test blood sugar 3 times daily as instructed. Dx code: 250.50  04/01/14  Yes Philemon Kingdom, MD  amitriptyline (ELAVIL) 25 MG tablet Take 25 mg by mouth at bedtime.   Yes Historical Provider, MD  amLODipine (NORVASC) 5 MG tablet Take 5 mg by mouth daily.   Yes Historical Provider, MD  glucose blood (ACCU-CHEK AVIVA PLUS) test strip Use to test blood sugar 3 times daily as instructed. Dx code: E11.319 05/13/14  Yes Philemon Kingdom, MD  ibuprofen (ADVIL,MOTRIN) 600 MG tablet Take 600 mg by mouth every 8 (eight) hours as needed for pain.   Yes Historical Provider, MD  insulin aspart (NOVOLOG FLEXPEN) 100 UNIT/ML FlexPen Inject 14-20 Units into the skin 3 (three) times daily with meals. Use sliding scale as instructed. 11/04/14  Yes Philemon Kingdom, MD  Insulin Glargine (LANTUS SOLOSTAR) 100 UNIT/ML Solostar Pen Inject 40 Units into the skin at bedtime. Patient taking differently: Inject 50 Units into the skin at bedtime.  05/13/14  Yes Philemon Kingdom, MD  Insulin Pen Needle (BD PEN NEEDLE NANO U/F) 32G X 4 MM MISC Use to inject insulin 4 times daily. 05/13/14  Yes Philemon Kingdom, MD  losartan-hydrochlorothiazide (HYZAAR) 100-12.5 MG per tablet Take 1 tablet by mouth daily.   Yes Historical Provider, MD  metFORMIN (GLUCOPHAGE) 1000 MG tablet Take 1,000 mg by mouth 2 (two) times daily with a meal.   Yes Historical Provider, MD  naproxen (NAPROSYN) 500 MG tablet Take 1  tablet (500 mg total) by mouth 2 (two) times daily with a meal. 01/12/15  Yes Noemi Chapel, MD  nitroGLYCERIN (NITROSTAT) 0.4 MG SL tablet Place 0.4 mg under the tongue every 5 (five) minutes as needed for chest pain.   Yes Historical Provider, MD  pantoprazole (PROTONIX) 40 MG tablet Take 40 mg by mouth daily.   Yes Historical Provider, MD  methocarbamol (ROBAXIN) 500 MG tablet Take 1 tablet (500 mg total) by mouth 2 (two) times daily as needed for muscle spasms. Patient not taking: Reported on 01/26/2015 01/12/15   Noemi Chapel, MD  metroNIDAZOLE (FLAGYL) 500 MG tablet Take 1 tablet (500 mg total)  by mouth 2 (two) times daily. One po bid x 7 days 01/26/15   Carman Ching, PA-C  nystatin (MYCOSTATIN) powder Apply topically 3 (three) times daily. 01/26/15   Kailena Lubas M Jeda Pardue, PA-C   BP 140/93 mmHg  Pulse 88  Temp(Src) 97.7 F (36.5 C) (Oral)  Resp 19  Ht 5\' 6"  (1.676 m)  Wt 218 lb (98.884 kg)  BMI 35.20 kg/m2  SpO2 94%  LMP 04/01/2010 Physical Exam  Constitutional: She is oriented to person, place, and time. She appears well-developed and well-nourished. No distress.  Obese.  HENT:  Head: Normocephalic and atraumatic.  Mouth/Throat: Oropharynx is clear and moist.  Eyes: Conjunctivae and EOM are normal.  Neck: Normal range of motion. Neck supple.  Cardiovascular: Normal rate, regular rhythm and normal heart sounds.   Pulmonary/Chest: Effort normal and breath sounds normal. No respiratory distress.  Abdominal: Soft. Bowel sounds are normal. She exhibits no distension. There is no tenderness.  Genitourinary: Uterus normal. No erythema, tenderness or bleeding in the vagina. Vaginal discharge (scant, white) found.  Moist area of erythema around labia with mild swelling.  Musculoskeletal: Normal range of motion. She exhibits no edema.  Neurological: She is alert and oriented to person, place, and time. No sensory deficit.  Skin: Skin is warm and dry.  Psychiatric: She has a normal mood and affect. Her behavior is normal.  Nursing note and vitals reviewed.   ED Course  Procedures (including critical care time) Labs Review Labs Reviewed  WET PREP, GENITAL - Abnormal; Notable for the following:    Trich, Wet Prep MANY (*)    WBC, Wet Prep HPF POC MANY (*)    All other components within normal limits  URINALYSIS, ROUTINE W REFLEX MICROSCOPIC (NOT AT [facility]) - Abnormal; Notable for the following:    Specific Gravity, Urine 1.035 (*)    Glucose, UA >1000 (*)    Ketones, ur 15 (*)    All other components within normal limits  CBC - Abnormal; Notable for the following:    WBC 3.5 (*)     Hemoglobin 11.4 (*)    HCT 34.6 (*)    All other components within normal limits  BASIC METABOLIC PANEL - Abnormal; Notable for the following:    Sodium 130 (*)    Chloride 94 (*)    Glucose, Bld 494 (*)    All other components within normal limits  URINE MICROSCOPIC-ADD ON - Abnormal; Notable for the following:    Squamous Epithelial / LPF FEW (*)    Bacteria, UA FEW (*)    All other components within normal limits  CBG MONITORING, ED - Abnormal; Notable for the following:    Glucose-Capillary 550 (*)    All other components within normal limits  I-STAT CHEM 8, ED - Abnormal; Notable for the following:    Sodium  132 (*)    Chloride 92 (*)    Glucose, Bld 539 (*)    All other components within normal limits  CBG MONITORING, ED - Abnormal; Notable for the following:    Glucose-Capillary 436 (*)    All other components within normal limits  CBG MONITORING, ED - Abnormal; Notable for the following:    Glucose-Capillary 352 (*)    All other components within normal limits  I-STAT CG4 LACTIC ACID, ED  GC/CHLAMYDIA PROBE AMP ([place]) NOT AT [facility]    Imaging Review No results found.   EKG Interpretation None      MDM   Final diagnoses:  Labial irritation  Trichomonal vaginitis  Hyperglycemia   Nontoxic-appearing, NAD.  AF VSS.  Wet prep significant for trichomonas.  CBG checked given that patient admits to being a poorly controlled diabetic and noted to be 550.  At that time chem 8, obtained, which showed gap of 18.  Patient started on glucose stabilizer and given IV fluids , while normal.  Labs were pending.  On BMP, anion gap 10.  Blood sugar improved to 352 in the ED.  Patient is asymptomatic other than the vaginal complaints.  Discussed importance of dietary changes and compliance with diabetes.  Patient stable for discharge.  Will start patient on Flagyl for trichomonas and nystatin powder for outer irritation.  Infection care/precautions discussed.  Follow-up with PCP  within one week. Return precautions given. Patient states understanding of treatment care plan and is agreeable.  Discussed with attending Dr. Colin Rhein who agrees with plan of care.  Carman Ching, PA-C 01/26/15 1213  Debby Freiberg, MD 01/27/15 1004

## 2015-01-27 LAB — GC/CHLAMYDIA PROBE AMP (~~LOC~~) NOT AT ARMC
CHLAMYDIA, DNA PROBE: NEGATIVE
Neisseria Gonorrhea: NEGATIVE

## 2015-01-30 ENCOUNTER — Encounter (HOSPITAL_COMMUNITY): Payer: Self-pay | Admitting: Emergency Medicine

## 2015-01-30 ENCOUNTER — Emergency Department (HOSPITAL_COMMUNITY)
Admission: EM | Admit: 2015-01-30 | Discharge: 2015-01-30 | Disposition: A | Discharge status: Home / Self Care | Payer: Medicaid Other | Source: Home / Self Care | Attending: Family Medicine | Admitting: Family Medicine

## 2015-01-30 DIAGNOSIS — A5901 Trichomonal vulvovaginitis: Secondary | ICD-10-CM | POA: Diagnosis not present

## 2015-01-30 DIAGNOSIS — B001 Herpesviral vesicular dermatitis: Secondary | ICD-10-CM | POA: Diagnosis not present

## 2015-01-30 DIAGNOSIS — I10 Essential (primary) hypertension: Secondary | ICD-10-CM

## 2015-01-30 MED ORDER — ACYCLOVIR 800 MG PO TABS: 800.0000 mg | ORAL_TABLET | Freq: Two times a day (BID) | ORAL | Status: DC

## 2015-01-30 NOTE — ED Notes (Addendum)
Co vaginal discharge States vagina is swollen and sore  Was seen at ER

## 2015-01-30 NOTE — ED Provider Notes (Signed)
CSN: [id number]     Arrival date & time 01/30/15  1730 History   First MD Initiated Contact with Patient 01/30/15 1838     Chief Complaint  Patient presents with  . Vaginal Discharge   (Consider location/radiation/quality/duration/timing/severity/associated sxs/prior Treatment) HPI  Vaginal pain. Persisted for 6 days. Seen in ED 4 days ago and treated for trichomonas vaginitis as well as possible intertrigo. Patient started on metronidazole twice daily for 7 days but after pill count is evident the patient has only taken her medicine once daily at best. Patient does admit to poor compliance with her medication. Patient states that her symptoms are getting worse. Area around the vagina is very tender per patient and very sore. Denies any vaginal discharge. Last sexual contact was in October 2015. Denies any abdominal pain, dysuria, frequency, back pain, fevers, nausea, vomiting, rash. After further coarsening patient states that she does recall having genital herpes for 5 years ago.  Past Medical History  Diagnosis Date  . Diabetes mellitus   . Hypertension   . Fibroids   . Anemia   . MRSA (methicillin resistant Staphylococcus aureus)   . Trichomonas    Past Surgical History  Procedure Laterality Date  . Cesarean section      breech   Family History  Problem Relation Age of Onset  . Other Neg Hx    History  Substance Use Topics  . Smoking status: Never Smoker   . Smokeless tobacco: Not on file  . Alcohol Use: Yes   OB History    Gravida Para Term Preterm AB TAB SAB Ectopic Multiple Living   1 1 1       1      Review of Systems Per HPI with all other pertinent systems negative.    Allergies  Review of patient's allergies indicates no known allergies.  Home Medications   Prior to Admission medications   Medication Sig Start Date End Date Taking? Authorizing Provider  ACCU-CHEK SOFTCLIX LANCETS lancets Use to test blood sugar 3 times daily as instructed. Dx code: 250.50  04/01/14   Philemon Kingdom, MD  acyclovir (ZOVIRAX) 800 MG tablet Take 1 tablet (800 mg total) by mouth 2 (two) times daily. 01/30/15   Waldemar Dickens, MD  amitriptyline (ELAVIL) 25 MG tablet Take 25 mg by mouth at bedtime.    Historical Provider, MD  amLODipine (NORVASC) 5 MG tablet Take 5 mg by mouth daily.    Historical Provider, MD  glucose blood (ACCU-CHEK AVIVA PLUS) test strip Use to test blood sugar 3 times daily as instructed. Dx code: E11.319 05/13/14   Philemon Kingdom, MD  ibuprofen (ADVIL,MOTRIN) 600 MG tablet Take 600 mg by mouth every 8 (eight) hours as needed for pain.    Historical Provider, MD  insulin aspart (NOVOLOG FLEXPEN) 100 UNIT/ML FlexPen Inject 14-20 Units into the skin 3 (three) times daily with meals. Use sliding scale as instructed. 11/04/14   Philemon Kingdom, MD  Insulin Glargine (LANTUS SOLOSTAR) 100 UNIT/ML Solostar Pen Inject 40 Units into the skin at bedtime. Patient taking differently: Inject 50 Units into the skin at bedtime.  05/13/14   Philemon Kingdom, MD  Insulin Pen Needle (BD PEN NEEDLE NANO U/F) 32G X 4 MM MISC Use to inject insulin 4 times daily. 05/13/14   Philemon Kingdom, MD  losartan-hydrochlorothiazide (HYZAAR) 100-12.5 MG per tablet Take 1 tablet by mouth daily.    Historical Provider, MD  metFORMIN (GLUCOPHAGE) 1000 MG tablet Take 1,000 mg by mouth 2 (two)  times daily with a meal.    Historical Provider, MD  methocarbamol (ROBAXIN) 500 MG tablet Take 1 tablet (500 mg total) by mouth 2 (two) times daily as needed for muscle spasms. Patient not taking: Reported on 01/26/2015 01/12/15   Noemi Chapel, MD  metroNIDAZOLE (FLAGYL) 500 MG tablet Take 1 tablet (500 mg total) by mouth 2 (two) times daily. One po bid x 7 days 01/26/15   Carman Ching, PA-C  naproxen (NAPROSYN) 500 MG tablet Take 1 tablet (500 mg total) by mouth 2 (two) times daily with a meal. 01/12/15   Noemi Chapel, MD  nitroGLYCERIN (NITROSTAT) 0.4 MG SL tablet Place 0.4 mg under the tongue  every 5 (five) minutes as needed for chest pain.    Historical Provider, MD  nystatin (MYCOSTATIN) powder Apply topically 3 (three) times daily. 01/26/15   Robyn M Hess, PA-C  pantoprazole (PROTONIX) 40 MG tablet Take 40 mg by mouth daily.    Historical Provider, MD   BP 173/110 mmHg  Pulse 103  Temp(Src) 98.5 F (36.9 C) (Oral)  Resp 18  SpO2 97%  LMP 04/01/2010 Physical Exam Physical Exam  Constitutional: oriented to person, place, and time. appears well-developed and well-nourished. No distress.  HENT:  Head: Normocephalic and atraumatic.  Eyes: EOMI. PERRL.  Neck: Normal range of motion.  Cardiovascular: RRR, no m/r/g, 2+ distal pulses,  Pulmonary/Chest: Effort normal and breath sounds normal. No respiratory distress.  Abdominal: Soft. Bowel sounds are normal. NonTTP, no distension.  Musculoskeletal: Normal range of motion. Non ttp, no effusion.  Neurological: alert and oriented to person, place, and time.  Skin: Skin is warm. No rash noted. non diaphoretic.  Psychiatric: normal mood and affect. behavior is normal. Judgment and thought content normal.  GU: Labial folds with numerous ulcerated tender lesions.  ED Course  Procedures (including critical care time) Labs Review Labs Reviewed - No data to display  Imaging Review No results found.   MDM   1. Herpes labialis   2. Trichomonal vaginitis   3. Essential hypertension    Discussed medical compliance with metronidazole and patient states that she will take her medication as prescribed. Patient to continue her nystatin powder. Will start patient on acyclovir for labial herpes recurrent outbreak. Patient states she did not take her blood pressure medication this morning and is somewhat anxious and in pain. Reiterated the importance of patient taking her medications as prescribed she states that she'll do so tonight when she gets home.    Waldemar Dickens, MD 01/30/15 [phone]

## 2015-01-30 NOTE — Discharge Instructions (Signed)
You are likely suffering from herpes. Please take the acyclovir as prescribed. Please continue to take your metronidazole but do so as prescribed twice daily. Please use the nystatin powder. Please follow-up with primary care physician or the emergency room as needed.

## 2015-04-30 ENCOUNTER — Emergency Department (HOSPITAL_COMMUNITY)
Admission: EM | Admit: 2015-04-30 | Discharge: 2015-04-30 | Disposition: A | Discharge status: Home / Self Care | Payer: Medicaid Other | Attending: Emergency Medicine | Admitting: Emergency Medicine

## 2015-04-30 ENCOUNTER — Emergency Department (HOSPITAL_COMMUNITY): Payer: Medicaid Other

## 2015-04-30 ENCOUNTER — Encounter (HOSPITAL_COMMUNITY): Payer: Self-pay

## 2015-04-30 DIAGNOSIS — Z79899 Other long term (current) drug therapy: Secondary | ICD-10-CM | POA: Insufficient documentation

## 2015-04-30 DIAGNOSIS — Z86018 Personal history of other benign neoplasm: Secondary | ICD-10-CM | POA: Diagnosis not present

## 2015-04-30 DIAGNOSIS — R079 Chest pain, unspecified: Secondary | ICD-10-CM | POA: Insufficient documentation

## 2015-04-30 DIAGNOSIS — Z8614 Personal history of Methicillin resistant Staphylococcus aureus infection: Secondary | ICD-10-CM | POA: Diagnosis not present

## 2015-04-30 DIAGNOSIS — E119 Type 2 diabetes mellitus without complications: Secondary | ICD-10-CM | POA: Diagnosis not present

## 2015-04-30 DIAGNOSIS — J111 Influenza due to unidentified influenza virus with other respiratory manifestations: Secondary | ICD-10-CM | POA: Diagnosis not present

## 2015-04-30 DIAGNOSIS — Z862 Personal history of diseases of the blood and blood-forming organs and certain disorders involving the immune mechanism: Secondary | ICD-10-CM | POA: Insufficient documentation

## 2015-04-30 DIAGNOSIS — R0981 Nasal congestion: Secondary | ICD-10-CM | POA: Diagnosis present

## 2015-04-30 DIAGNOSIS — Z794 Long term (current) use of insulin: Secondary | ICD-10-CM | POA: Diagnosis not present

## 2015-04-30 DIAGNOSIS — Z8619 Personal history of other infectious and parasitic diseases: Secondary | ICD-10-CM | POA: Insufficient documentation

## 2015-04-30 DIAGNOSIS — R197 Diarrhea, unspecified: Secondary | ICD-10-CM | POA: Insufficient documentation

## 2015-04-30 DIAGNOSIS — Z792 Long term (current) use of antibiotics: Secondary | ICD-10-CM | POA: Insufficient documentation

## 2015-04-30 DIAGNOSIS — I10 Essential (primary) hypertension: Secondary | ICD-10-CM | POA: Insufficient documentation

## 2015-04-30 LAB — I-STAT CHEM 8, ED
BUN: 14 mg/dL (ref 6–20)
CALCIUM ION: 1.19 mmol/L (ref 1.12–1.23)
CREATININE: 0.5 mg/dL (ref 0.44–1.00)
Chloride: 101 mmol/L (ref 101–111)
GLUCOSE: 229 mg/dL — AB (ref 65–99)
HEMATOCRIT: 36 % (ref 36.0–46.0)
HEMOGLOBIN: 12.2 g/dL (ref 12.0–15.0)
Potassium: 3.8 mmol/L (ref 3.5–5.1)
Sodium: 138 mmol/L (ref 135–145)
TCO2: 23 mmol/L (ref 0–100)

## 2015-04-30 LAB — CBG MONITORING, ED: Glucose-Capillary: 193 mg/dL — ABNORMAL HIGH (ref 65–99)

## 2015-04-30 LAB — I-STAT TROPONIN, ED: TROPONIN I, POC: 0 ng/mL (ref 0.00–0.08)

## 2015-04-30 MED ORDER — SODIUM CHLORIDE 0.9 % IV BOLUS (SEPSIS)
1000.0000 mL | Freq: Once | INTRAVENOUS | Status: AC
  Administered 2015-04-30: 1000 mL via INTRAVENOUS

## 2015-04-30 MED ORDER — GUAIFENESIN-CODEINE 100-10 MG/5ML PO SOLN: 5.0000 mL | Freq: Three times a day (TID) | ORAL | Status: DC | PRN

## 2015-04-30 MED ORDER — OXYCODONE-ACETAMINOPHEN 5-325 MG PO TABS
1.0000 | ORAL_TABLET | Freq: Once | ORAL | Status: AC
  Administered 2015-04-30: 1 via ORAL
  Filled 2015-04-30: qty 1

## 2015-04-30 MED ORDER — AZITHROMYCIN 250 MG PO TABS
500.0000 mg | ORAL_TABLET | Freq: Once | ORAL | Status: AC
  Administered 2015-04-30: 500 mg via ORAL
  Filled 2015-04-30: qty 2

## 2015-04-30 MED ORDER — AMITRIPTYLINE HCL 25 MG PO TABS: 25.0000 mg | ORAL_TABLET | Freq: Every day | ORAL | Status: DC

## 2015-04-30 MED ORDER — ONDANSETRON 4 MG PO TBDP
4.0000 mg | ORAL_TABLET | Freq: Once | ORAL | Status: AC
  Administered 2015-04-30: 4 mg via ORAL
  Filled 2015-04-30: qty 1

## 2015-04-30 MED ORDER — AZITHROMYCIN 250 MG PO TABS: 250.0000 mg | ORAL_TABLET | Freq: Every day | ORAL | Status: DC

## 2015-04-30 NOTE — Discharge Instructions (Signed)

## 2015-04-30 NOTE — ED Provider Notes (Signed)
CSN: [id number]     Arrival date & time 04/30/15  0720 History   First MD Initiated Contact with Patient 04/30/15 [phone]     Chief Complaint  Patient presents with  . URI     (Consider location/radiation/quality/duration/timing/severity/associated sxs/prior Treatment) HPI PCP: Angelica Chessman, MD PMH: diabetes, hypertension, fibroids, anemia, trichomonas  [name] [name] is a 54 y.o.  female  She reports starting to feel poorly yesterday with diarrhea, nasal congestion, chest pains, dry cough, shortness of breath.  Chest pains started last night and are sharp, last a few seconds and then resolve on there own.  Last night she took Claritin for her cold, went to sleep and then when she woke up this morning she was feeling even worse. Her eyes are tearing, she has nasal drainage. She has been around a 54 year old that was sick and believes she got it from him. She did not get the flu shot this year.  ROS: The patient denies diaphoresis, fever, headache, weakness (general or focal), confusion, change of vision,  dysphagia, aphagia, shortness of breath,  abdominal pains, nausea, vomiting, lower extremity swelling, rash, neck pain.  Past Medical History  Diagnosis Date  . Diabetes mellitus   . Hypertension   . Fibroids   . Anemia   . MRSA (methicillin resistant Staphylococcus aureus)   . Trichomonas    Past Surgical History  Procedure Laterality Date  . Cesarean section      breech   Family History  Problem Relation Age of Onset  . Other Neg Hx    Social History  Substance Use Topics  . Smoking status: Never Smoker   . Smokeless tobacco: None  . Alcohol Use: Yes   OB History    Gravida Para Term Preterm AB TAB SAB Ectopic Multiple Living   1 1 1       1      Review of Systems  10 Systems reviewed and are negative for acute change except as noted in the HPI.     Allergies  Review of patient's allergies indicates no known allergies.  Home Medications   Prior to  Admission medications   Medication Sig Start Date End Date Taking? Authorizing Provider  ACCU-CHEK SOFTCLIX LANCETS lancets Use to test blood sugar 3 times daily as instructed. Dx code: 250.50 04/01/14   Philemon Kingdom, MD  acyclovir (ZOVIRAX) 800 MG tablet Take 1 tablet (800 mg total) by mouth 2 (two) times daily. 01/30/15   Waldemar Dickens, MD  amitriptyline (ELAVIL) 25 MG tablet Take 25 mg by mouth at bedtime.    Historical Provider, MD  amLODipine (NORVASC) 5 MG tablet Take 5 mg by mouth daily.    Historical Provider, MD  azithromycin (ZITHROMAX) 250 MG tablet Take 1 tablet (250 mg total) by mouth daily. Take 1 tab every day until finished. 05/01/15   Vy Badley Carlota Raspberry, PA-C  glucose blood (ACCU-CHEK AVIVA PLUS) test strip Use to test blood sugar 3 times daily as instructed. Dx code: E11.319 05/13/14   Philemon Kingdom, MD  ibuprofen (ADVIL,MOTRIN) 600 MG tablet Take 600 mg by mouth every 8 (eight) hours as needed for pain.    Historical Provider, MD  insulin aspart (NOVOLOG FLEXPEN) 100 UNIT/ML FlexPen Inject 14-20 Units into the skin 3 (three) times daily with meals. Use sliding scale as instructed. 11/04/14   Philemon Kingdom, MD  Insulin Glargine (LANTUS SOLOSTAR) 100 UNIT/ML Solostar Pen Inject 40 Units into the skin at bedtime. Patient taking differently: Inject 50 Units into  the skin at bedtime.  05/13/14   Philemon Kingdom, MD  Insulin Pen Needle (BD PEN NEEDLE NANO U/F) 32G X 4 MM MISC Use to inject insulin 4 times daily. 05/13/14   Philemon Kingdom, MD  losartan-hydrochlorothiazide (HYZAAR) 100-12.5 MG per tablet Take 1 tablet by mouth daily.    Historical Provider, MD  metFORMIN (GLUCOPHAGE) 1000 MG tablet Take 1,000 mg by mouth 2 (two) times daily with a meal.    Historical Provider, MD  methocarbamol (ROBAXIN) 500 MG tablet Take 1 tablet (500 mg total) by mouth 2 (two) times daily as needed for muscle spasms. Patient not taking: Reported on 01/26/2015 01/12/15   Noemi Chapel, MD   metroNIDAZOLE (FLAGYL) 500 MG tablet Take 1 tablet (500 mg total) by mouth 2 (two) times daily. One po bid x 7 days 01/26/15   Carman Ching, PA-C  naproxen (NAPROSYN) 500 MG tablet Take 1 tablet (500 mg total) by mouth 2 (two) times daily with a meal. 01/12/15   Noemi Chapel, MD  nitroGLYCERIN (NITROSTAT) 0.4 MG SL tablet Place 0.4 mg under the tongue every 5 (five) minutes as needed for chest pain.    Historical Provider, MD  nystatin (MYCOSTATIN) powder Apply topically 3 (three) times daily. 01/26/15   Robyn M Hess, PA-C  pantoprazole (PROTONIX) 40 MG tablet Take 40 mg by mouth daily.    Historical Provider, MD   BP 133/83 mmHg  Pulse 88  Temp(Src) 98.6 F (37 C) (Oral)  Resp 18  Ht 5\' 6"  (1.676 m)  Wt 202 lb (91.627 kg)  BMI 32.62 kg/m2  SpO2 99%  LMP 04/01/2010 Physical Exam  Constitutional: She appears well-developed and well-nourished. No distress.  HENT:  Head: Normocephalic and atraumatic.  Right Ear: Tympanic membrane and ear canal normal.  Left Ear: Tympanic membrane and ear canal normal.  Nose: Rhinorrhea present. Right sinus exhibits frontal sinus tenderness. Left sinus exhibits frontal sinus tenderness. Left sinus exhibits no maxillary sinus tenderness.  Mouth/Throat: Uvula is midline and oropharynx is clear and moist.  Eyes: Pupils are equal, round, and reactive to light.  Neck: Normal range of motion. Neck supple. No spinous process tenderness and no muscular tenderness present.  Cardiovascular: Normal rate and regular rhythm.   Pulmonary/Chest: Effort normal. No respiratory distress. She has no wheezes. She has no rales.  Abdominal: Soft. Bowel sounds are normal. There is no tenderness. There is no rigidity and no guarding.  Musculoskeletal:  No LE swelling.  Neurological: She is alert.  Skin: Skin is warm and dry.  Nursing note and vitals reviewed.   ED Course  Procedures (including critical care time) Labs Review Labs Reviewed  CBG MONITORING, ED - Abnormal;  Notable for the following:    Glucose-Capillary 193 (*)    All other components within normal limits  I-STAT CHEM 8, ED - Abnormal; Notable for the following:    Glucose, Bld 229 (*)    All other components within normal limits  I-STAT TROPOININ, ED    Imaging Review Dg Chest 2 View  04/30/2015  CLINICAL DATA:  Cough, shortness of breath. EXAM: CHEST  2 VIEW COMPARISON:  April 21, 2013. FINDINGS: The heart size and mediastinal contours are within normal limits. Both lungs are clear. No pneumothorax or pleural effusion is noted. The visualized skeletal structures are unremarkable. IMPRESSION: No active cardiopulmonary disease. Electronically Signed   By: Marijo Conception, M.D.   On: 04/30/2015 08:18   I have personally reviewed and evaluated these images and lab  results as part of my medical decision-making.   EKG Interpretation None      MDM   Final diagnoses:  Influenza    Patients symptoms are consistent with viral/influenza illness. Her glucose is 229, she has been given 1 L NS to correct, negative Trop normal chest xray. Her chest pain is very atypical for cardiac disease and seems more related to flu-like symptoms. Due to diabetes, cough with CP, will treat with abx to prevent possible early pneumonia.  Rx: Azithromycin and  Guaifenesin with codeine   Medications  sodium chloride 0.9 % bolus 1,000 mL (1,000 mLs Intravenous New Bag/Given 04/30/15 0844)  azithromycin (ZITHROMAX) tablet 500 mg (not administered)  oxyCODONE-acetaminophen (PERCOCET/ROXICET) 5-325 MG per tablet 1 tablet (1 tablet Oral Given 04/30/15 0843)  ondansetron (ZOFRAN-ODT) disintegrating tablet 4 mg (4 mg Oral Given 04/30/15 0843)    54 y.o.[name] [name]'s medical screening exam was performed and I feel the patient has had an appropriate workup for their chief complaint at this time and likelihood of emergent condition existing is low. They have been counseled on decision, discharge, follow up and which  symptoms necessitate immediate return to the emergency department. They or their family verbally stated understanding and agreement with plan and discharged in stable condition.   Vital signs are stable at discharge. Filed Vitals:   04/30/15 0815  BP: 133/83  Pulse: 88  Temp:   Resp: [address] PA-C 04/30/15 [place], DO 05/01/15 1123

## 2015-04-30 NOTE — ED Notes (Signed)
Pt. Presents with complaint of cold symptoms and intermittent chest pain with movement starting yesterday. Pt. States she has dry cough.

## 2015-06-07 ENCOUNTER — Encounter (HOSPITAL_COMMUNITY): Payer: Self-pay | Admitting: *Deleted

## 2015-06-07 ENCOUNTER — Emergency Department (HOSPITAL_COMMUNITY)
Admission: EM | Admit: 2015-06-07 | Discharge: 2015-06-07 | Disposition: A | Discharge status: Home / Self Care | Payer: Medicaid Other | Attending: Emergency Medicine | Admitting: Emergency Medicine

## 2015-06-07 DIAGNOSIS — Z862 Personal history of diseases of the blood and blood-forming organs and certain disorders involving the immune mechanism: Secondary | ICD-10-CM | POA: Diagnosis not present

## 2015-06-07 DIAGNOSIS — E1142 Type 2 diabetes mellitus with diabetic polyneuropathy: Secondary | ICD-10-CM | POA: Insufficient documentation

## 2015-06-07 DIAGNOSIS — Z8614 Personal history of Methicillin resistant Staphylococcus aureus infection: Secondary | ICD-10-CM | POA: Insufficient documentation

## 2015-06-07 DIAGNOSIS — Z792 Long term (current) use of antibiotics: Secondary | ICD-10-CM | POA: Diagnosis not present

## 2015-06-07 DIAGNOSIS — E1342 Other specified diabetes mellitus with diabetic polyneuropathy: Secondary | ICD-10-CM

## 2015-06-07 DIAGNOSIS — Z794 Long term (current) use of insulin: Secondary | ICD-10-CM | POA: Insufficient documentation

## 2015-06-07 DIAGNOSIS — M79672 Pain in left foot: Secondary | ICD-10-CM | POA: Diagnosis not present

## 2015-06-07 DIAGNOSIS — Z8619 Personal history of other infectious and parasitic diseases: Secondary | ICD-10-CM | POA: Insufficient documentation

## 2015-06-07 DIAGNOSIS — I10 Essential (primary) hypertension: Secondary | ICD-10-CM | POA: Diagnosis not present

## 2015-06-07 DIAGNOSIS — Z79899 Other long term (current) drug therapy: Secondary | ICD-10-CM | POA: Diagnosis not present

## 2015-06-07 DIAGNOSIS — Z86018 Personal history of other benign neoplasm: Secondary | ICD-10-CM | POA: Diagnosis not present

## 2015-06-07 DIAGNOSIS — M79671 Pain in right foot: Secondary | ICD-10-CM | POA: Diagnosis present

## 2015-06-07 MED ORDER — GABAPENTIN 300 MG PO CAPS: ORAL_CAPSULE | ORAL | Status: DC

## 2015-06-07 NOTE — ED Notes (Signed)
Pt states she is having bilateral foot and leg pain. Pt states she feels like she has swelling. Upon assessment pedal pulses are moderate and no pitting edema noted. Pt states she is diabetic.

## 2015-06-07 NOTE — ED Provider Notes (Signed)
CSN: [id number]     Arrival date & time 06/07/15  0919 History   None    Chief Complaint  Patient presents with  . Foot Pain  . Leg Pain      HPI Pt states she is having bilateral foot and leg pain. Pt states she feels like she has swelling. Upon assessment pedal pulses are moderate and no pitting edema noted. Pt states she is diabetic. Past Medical History  Diagnosis Date  . Diabetes mellitus   . Hypertension   . Fibroids   . Anemia   . MRSA (methicillin resistant Staphylococcus aureus)   . Trichomonas    Past Surgical History  Procedure Laterality Date  . Cesarean section      breech   Family History  Problem Relation Age of Onset  . Other Neg Hx    Social History  Substance Use Topics  . Smoking status: Never Smoker   . Smokeless tobacco: None  . Alcohol Use: Yes   OB History    Gravida Para Term Preterm AB TAB SAB Ectopic Multiple Living   1 1 1       1      Review of Systems    Allergies  Review of patient's allergies indicates no known allergies.  Home Medications   Prior to Admission medications   Medication Sig Start Date End Date Taking? Authorizing Provider  ACCU-CHEK SOFTCLIX LANCETS lancets Use to test blood sugar 3 times daily as instructed. Dx code: 250.50 04/01/14   Philemon Kingdom, MD  amitriptyline (ELAVIL) 25 MG tablet Take 1 tablet (25 mg total) by mouth at bedtime. 04/30/15   Tiffany Carlota Raspberry, PA-C  amLODipine (NORVASC) 5 MG tablet Take 5 mg by mouth daily.    Historical Provider, MD  azithromycin (ZITHROMAX) 250 MG tablet Take 1 tablet (250 mg total) by mouth daily. Take 1 tab every day until finished. 05/01/15   Tiffany Carlota Raspberry, PA-C  gabapentin (NEURONTIN) 300 MG capsule Take 1 tablet on day 1 at night. Take 1 tablet in the morning and 1 tablet at night on day 2. Take 1 tablet 3 times a day on day 3. May increase 1 tablet per day to a maximum of 9 tablets per day. 06/07/15   Leonard Schwartz, MD  glucose blood (ACCU-CHEK AVIVA PLUS) test  strip Use to test blood sugar 3 times daily as instructed. Dx code: E11.319 05/13/14   Philemon Kingdom, MD  guaiFENesin-codeine 100-10 MG/5ML syrup Take 5 mLs by mouth 3 (three) times daily as needed for cough. 04/30/15   Tiffany Carlota Raspberry, PA-C  ibuprofen (ADVIL,MOTRIN) 600 MG tablet Take 600 mg by mouth every 8 (eight) hours as needed for pain.    Historical Provider, MD  insulin aspart (NOVOLOG FLEXPEN) 100 UNIT/ML FlexPen Inject 14-20 Units into the skin 3 (three) times daily with meals. Use sliding scale as instructed. 11/04/14   Philemon Kingdom, MD  Insulin Glargine (LANTUS SOLOSTAR) 100 UNIT/ML Solostar Pen Inject 40 Units into the skin at bedtime. Patient taking differently: Inject 24 Units into the skin at bedtime.  05/13/14   Philemon Kingdom, MD  Insulin Pen Needle (BD PEN NEEDLE NANO U/F) 32G X 4 MM MISC Use to inject insulin 4 times daily. 05/13/14   Philemon Kingdom, MD  losartan-hydrochlorothiazide (HYZAAR) 100-12.5 MG per tablet Take 1 tablet by mouth daily.    Historical Provider, MD  metFORMIN (GLUCOPHAGE) 1000 MG tablet Take 1,000 mg by mouth 2 (two) times daily with a meal.    Historical Provider,  MD  nitroGLYCERIN (NITROSTAT) 0.4 MG SL tablet Place 0.4 mg under the tongue every 5 (five) minutes as needed for chest pain.    Historical Provider, MD   BP 158/96 mmHg  Pulse 84  Temp(Src) 97.6 F (36.4 C) (Oral)  Ht 5\' 6"  (1.676 m)  Wt 205 lb (92.987 kg)  BMI 33.10 kg/m2  SpO2 99%  LMP 04/01/2010 Physical Exam Physical Exam  Nursing note and vitals reviewed. Constitutional: She is oriented to person, place, and time. She appears well-developed and well-nourished. No distress.  HENT:  Head: Normocephalic and atraumatic.  Eyes: Pupils are equal, round, and reactive to light.  Neck: Normal range of motion.  Cardiovascular: Normal rate and intact distal pulses.   Pulmonary/Chest: No respiratory distress.  Abdominal: Normal appearance. She exhibits no distension.   Musculoskeletal: Normal range of motion.  Patient has good pulses in distal extremities.  She has minor edema to the right lower extremity.   Neurological: She is alert and oriented to person, place, and time. No cranial nerve deficit.  Skin: Skin is warm and dry. No rash noted.  Psychiatric: She has a normal mood and affect. Her behavior is normal.   ED Course  Procedures (including critical care time) Labs Review Labs Reviewed - No data to display  Imaging Review No results found. I have personally reviewed and evaluated these images and lab results as part of my medical decision-making.    MDM   Final diagnoses:  Diabetic polyneuropathy associated with other specified diabetes mellitus ([place])        Leonard Schwartz, MD 06/07/15 [phone]

## 2015-06-07 NOTE — ED Notes (Signed)
Pt is in stable condition upon d/c and is escorted from ED via wheelchair. 

## 2015-06-07 NOTE — Discharge Instructions (Signed)

## 2015-07-04 ENCOUNTER — Ambulatory Visit: Payer: Medicaid Other | Admitting: Podiatry

## 2015-07-14 ENCOUNTER — Ambulatory Visit (INDEPENDENT_AMBULATORY_CARE_PROVIDER_SITE_OTHER): Payer: Medicaid Other

## 2015-07-14 ENCOUNTER — Encounter: Payer: Self-pay | Admitting: Podiatry

## 2015-07-14 ENCOUNTER — Ambulatory Visit (INDEPENDENT_AMBULATORY_CARE_PROVIDER_SITE_OTHER): Payer: Medicaid Other | Admitting: Podiatry

## 2015-07-14 VITALS — BP 169/100 | HR 88 | Resp 18

## 2015-07-14 DIAGNOSIS — R52 Pain, unspecified: Secondary | ICD-10-CM

## 2015-07-14 DIAGNOSIS — S93609A Unspecified sprain of unspecified foot, initial encounter: Secondary | ICD-10-CM

## 2015-07-14 MED ORDER — MELOXICAM 15 MG PO TABS: 15.0000 mg | ORAL_TABLET | Freq: Every day | ORAL | Status: DC

## 2015-07-14 NOTE — Progress Notes (Signed)
   Subjective:    Patient ID: [name] [name], female    DOB: 21-Nov-1960, 54 y.o.   MRN: [id number]  HPI I AM A DIABETIC AND I WOULD LIKE TO HAVE MY FEET CHECKED AND MY FEET SWELL AND HURT AT THE TOES AND MY FEET PEEL AND CRACK  This 54 year old diabetic, presents to the office for an evaluation of both of her feet. She says she is diabetic and runs approximately 200+ each time the blood is tested.. She says that her feet have been hurting now for approximately 2 months. She says the swelling has been present for 2 months. She says prior to the 2 months pain and swelling in her feet. She was experiencing nerve pain for which she was treated with gabapentin.  She walks into the office with an antalgic gait from both feet. She denies any history of trauma or injury to the foot. She does say that her feet feel better with compression on her feet. She presents to the office for an evaluation and treatment   Review of Systems  All other systems reviewed and are negative.      Objective:   Physical Exam GENERAL APPEARANCE: Alert, conversant. Appropriately groomed. No acute distress.  VASCULAR: P Evaluation pedal pulses palpable at  DP and PT bilateral.  Capillary refill time is immediate to all digits,  Normal temperature gradient.  Digital hair growth is present bilateral  NEUROLOGIC: sensation is normal to 5.07 monofilament at 5/5 sites bilateral.  Light touch is intact bilateral, Muscle strength normal.  MUSCULOSKELETAL: acceptable muscle strength, tone and stability bilateral.  Intrinsic muscluature intact bilateral.  Rectus appearance of foot and digits noted bilateral. Evaluation of both feet reveal increased temperature and swelling both feet.  There is no ROM STJ due to guarding of the ankle.  Closer evaluation reveals increased temperature noted and pain noted in her lower legs over her tibia.  There is definite swelling lower leg B/L.  Severe pain in lower leg greater than her foot  pain.  DERMATOLOGIC: skin color, texture, and turgor are within normal limits.  No preulcerative lesions or ulcers  are seen, no interdigital maceration noted.  No open lesions present.  Digital nails are asymptomatic. No drainage noted. Dry scaly skin is noted in the absence of ulcers or infections. Thick mycotic nails nails noted.         Assessment & Plan:  Foot Sprain B/L    IE  Xrays reveal no evidence of Charcot foot.  No bony pathology noted.  I feel her lower legs are the cause of her antalgic gait and her pain.  Therefore I recommended she be evaluated for tibia pathology.  She was dispensed anklets for both feet and prescribed Mobic.  RTC  As needed.  Gardiner Barefoot DPM  Gardiner Barefoot DPM

## 2015-09-24 ENCOUNTER — Encounter (HOSPITAL_COMMUNITY): Payer: Self-pay | Admitting: Emergency Medicine

## 2015-09-24 ENCOUNTER — Emergency Department (HOSPITAL_COMMUNITY): Payer: Medicaid Other

## 2015-09-24 ENCOUNTER — Emergency Department (HOSPITAL_COMMUNITY)
Admission: EM | Admit: 2015-09-24 | Discharge: 2015-09-25 | Disposition: A | Discharge status: Home / Self Care | Payer: Medicaid Other | Attending: Emergency Medicine | Admitting: Emergency Medicine

## 2015-09-24 DIAGNOSIS — R509 Fever, unspecified: Secondary | ICD-10-CM | POA: Insufficient documentation

## 2015-09-24 DIAGNOSIS — J3489 Other specified disorders of nose and nasal sinuses: Secondary | ICD-10-CM | POA: Diagnosis not present

## 2015-09-24 DIAGNOSIS — Z8619 Personal history of other infectious and parasitic diseases: Secondary | ICD-10-CM | POA: Insufficient documentation

## 2015-09-24 DIAGNOSIS — I10 Essential (primary) hypertension: Secondary | ICD-10-CM | POA: Diagnosis not present

## 2015-09-24 DIAGNOSIS — R0981 Nasal congestion: Secondary | ICD-10-CM | POA: Insufficient documentation

## 2015-09-24 DIAGNOSIS — R6889 Other general symptoms and signs: Secondary | ICD-10-CM

## 2015-09-24 DIAGNOSIS — R5383 Other fatigue: Secondary | ICD-10-CM | POA: Diagnosis not present

## 2015-09-24 DIAGNOSIS — Z7984 Long term (current) use of oral hypoglycemic drugs: Secondary | ICD-10-CM | POA: Diagnosis not present

## 2015-09-24 DIAGNOSIS — R197 Diarrhea, unspecified: Secondary | ICD-10-CM | POA: Insufficient documentation

## 2015-09-24 DIAGNOSIS — Z8614 Personal history of Methicillin resistant Staphylococcus aureus infection: Secondary | ICD-10-CM | POA: Insufficient documentation

## 2015-09-24 DIAGNOSIS — R110 Nausea: Secondary | ICD-10-CM | POA: Diagnosis not present

## 2015-09-24 DIAGNOSIS — R51 Headache: Secondary | ICD-10-CM | POA: Diagnosis not present

## 2015-09-24 DIAGNOSIS — Z862 Personal history of diseases of the blood and blood-forming organs and certain disorders involving the immune mechanism: Secondary | ICD-10-CM | POA: Diagnosis not present

## 2015-09-24 DIAGNOSIS — E119 Type 2 diabetes mellitus without complications: Secondary | ICD-10-CM | POA: Diagnosis not present

## 2015-09-24 DIAGNOSIS — Z794 Long term (current) use of insulin: Secondary | ICD-10-CM | POA: Diagnosis not present

## 2015-09-24 DIAGNOSIS — Z79899 Other long term (current) drug therapy: Secondary | ICD-10-CM | POA: Insufficient documentation

## 2015-09-24 DIAGNOSIS — M791 Myalgia: Secondary | ICD-10-CM | POA: Insufficient documentation

## 2015-09-24 DIAGNOSIS — R42 Dizziness and giddiness: Secondary | ICD-10-CM | POA: Diagnosis not present

## 2015-09-24 DIAGNOSIS — R109 Unspecified abdominal pain: Secondary | ICD-10-CM | POA: Diagnosis not present

## 2015-09-24 DIAGNOSIS — Z86018 Personal history of other benign neoplasm: Secondary | ICD-10-CM | POA: Diagnosis not present

## 2015-09-24 LAB — CBC WITH DIFFERENTIAL/PLATELET
BASOS ABS: 0 10*3/uL (ref 0.0–0.1)
BASOS PCT: 0 %
EOS ABS: 0 10*3/uL (ref 0.0–0.7)
EOS PCT: 0 %
HCT: 35.3 % — ABNORMAL LOW (ref 36.0–46.0)
Hemoglobin: 11.6 g/dL — ABNORMAL LOW (ref 12.0–15.0)
Lymphocytes Relative: 22 %
Lymphs Abs: 0.9 10*3/uL (ref 0.7–4.0)
MCH: 26.7 pg (ref 26.0–34.0)
MCHC: 32.9 g/dL (ref 30.0–36.0)
MCV: 81.1 fL (ref 78.0–100.0)
MONO ABS: 0.5 10*3/uL (ref 0.1–1.0)
Monocytes Relative: 12 %
Neutro Abs: 2.7 10*3/uL (ref 1.7–7.7)
Neutrophils Relative %: 66 %
PLATELETS: 252 10*3/uL (ref 150–400)
RBC: 4.35 MIL/uL (ref 3.87–5.11)
RDW: 13.6 % (ref 11.5–15.5)
WBC: 4.1 10*3/uL (ref 4.0–10.5)

## 2015-09-24 LAB — COMPREHENSIVE METABOLIC PANEL
ALBUMIN: 3.5 g/dL (ref 3.5–5.0)
ALT: 20 U/L (ref 14–54)
AST: 26 U/L (ref 15–41)
Alkaline Phosphatase: 54 U/L (ref 38–126)
Anion gap: 12 (ref 5–15)
BUN: 9 mg/dL (ref 6–20)
CO2: 23 mmol/L (ref 22–32)
CREATININE: 0.53 mg/dL (ref 0.44–1.00)
Calcium: 9 mg/dL (ref 8.9–10.3)
Chloride: 103 mmol/L (ref 101–111)
GFR calc Af Amer: >60 mL/min (ref >60)
GFR calc non Af Amer: >60 mL/min (ref >60)
Glucose, Bld: 200 mg/dL — ABNORMAL HIGH (ref 65–99)
POTASSIUM: 3.8 mmol/L (ref 3.5–5.1)
SODIUM: 138 mmol/L (ref 135–145)
Total Bilirubin: 0.6 mg/dL (ref 0.3–1.2)
Total Protein: 7.1 g/dL (ref 6.5–8.1)

## 2015-09-24 LAB — URINE MICROSCOPIC-ADD ON

## 2015-09-24 LAB — URINALYSIS, ROUTINE W REFLEX MICROSCOPIC
BILIRUBIN URINE: NEGATIVE
Glucose, UA: 500 mg/dL — AB
Ketones, ur: NEGATIVE mg/dL
Leukocytes, UA: NEGATIVE
Nitrite: NEGATIVE
PH: 6 (ref 5.0–8.0)
Protein, ur: 100 mg/dL — AB
Specific Gravity, Urine: 1.022 (ref 1.005–1.030)

## 2015-09-24 LAB — CBG MONITORING, ED: GLUCOSE-CAPILLARY: 191 mg/dL — AB (ref 65–99)

## 2015-09-24 LAB — I-STAT CG4 LACTIC ACID, ED
LACTIC ACID, VENOUS: 2.67 mmol/L — AB (ref 0.5–2.0)
Lactic Acid, Venous: 0.52 mmol/L (ref 0.5–2.0)

## 2015-09-24 MED ORDER — SODIUM CHLORIDE 0.9 % IV BOLUS (SEPSIS)
1000.0000 mL | Freq: Once | INTRAVENOUS | Status: AC
  Administered 2015-09-24: 1000 mL via INTRAVENOUS

## 2015-09-24 MED ORDER — OSELTAMIVIR PHOSPHATE 75 MG PO CAPS
75.0000 mg | ORAL_CAPSULE | Freq: Once | ORAL | Status: AC
  Administered 2015-09-24: 75 mg via ORAL
  Filled 2015-09-24: qty 1

## 2015-09-24 MED ORDER — ONDANSETRON HCL 4 MG/2ML IJ SOLN
4.0000 mg | Freq: Once | INTRAMUSCULAR | Status: AC
  Administered 2015-09-24: 4 mg via INTRAVENOUS
  Filled 2015-09-24: qty 2

## 2015-09-24 MED ORDER — OSELTAMIVIR PHOSPHATE 75 MG PO CAPS: 75.0000 mg | ORAL_CAPSULE | Freq: Two times a day (BID) | ORAL | Status: DC

## 2015-09-24 MED ORDER — KETOROLAC TROMETHAMINE 15 MG/ML IJ SOLN
15.0000 mg | Freq: Once | INTRAMUSCULAR | Status: AC
  Administered 2015-09-24: 15 mg via INTRAVENOUS
  Filled 2015-09-24: qty 1

## 2015-09-24 MED ORDER — ONDANSETRON 8 MG PO TBDP: 8.0000 mg | ORAL_TABLET | Freq: Three times a day (TID) | ORAL | Status: DC | PRN

## 2015-09-24 MED ORDER — SODIUM CHLORIDE 0.9 % IV BOLUS (SEPSIS)
1000.0000 mL | Freq: Once | INTRAVENOUS | Status: AC
Start: 2015-09-24 — End: 2015-09-24
  Administered 2015-09-24: 1000 mL via INTRAVENOUS

## 2015-09-24 NOTE — Discharge Instructions (Signed)
Rest. Drink plenty of fluids. Zofran as prescribed as needed for nausea. Tamiflu to help you with symptoms. Follow up with your doctor for recheck in 2 days. Return if worsening symptoms.   Influenza, Adult Influenza ("the flu") is a viral infection of the respiratory tract. It occurs more often in winter months because people spend more time in close contact with one another. Influenza can make you feel very sick. Influenza easily spreads from person to person (contagious). CAUSES  Influenza is caused by a virus that infects the respiratory tract. You can catch the virus by breathing in droplets from an infected person's cough or sneeze. You can also catch the virus by touching something that was recently contaminated with the virus and then touching your mouth, nose, or eyes. RISKS AND COMPLICATIONS You may be at risk for a more severe case of influenza if you smoke cigarettes, have diabetes, have chronic heart disease (such as heart failure) or lung disease (such as asthma), or if you have a weakened immune system. Elderly people and pregnant women are also at risk for more serious infections. The most common problem of influenza is a lung infection (pneumonia). Sometimes, this problem can require emergency medical care and may be life threatening. SIGNS AND SYMPTOMS  Symptoms typically last 4 to 10 days and may include:  Fever.  Chills.  Headache, body aches, and muscle aches.  Sore throat.  Chest discomfort and cough.  Poor appetite.  Weakness or feeling tired.  Dizziness.  Nausea or vomiting. DIAGNOSIS  Diagnosis of influenza is often made based on your history and a physical exam. A nose or throat swab test can be done to confirm the diagnosis. TREATMENT  In mild cases, influenza goes away on its own. Treatment is directed at relieving symptoms. For more severe cases, your health care provider may prescribe antiviral medicines to shorten the sickness. Antibiotic medicines are  not effective because the infection is caused by a virus, not by bacteria. HOME CARE INSTRUCTIONS  Take medicines only as directed by your health care provider.  Use a cool mist humidifier to make breathing easier.  Get plenty of rest until your temperature returns to normal. This usually takes 3 to 4 days.  Drink enough fluid to keep your urine clear or pale yellow.  Cover yourmouth and nosewhen coughing or sneezing,and wash your handswellto prevent thevirusfrom spreading.  Stay homefromwork orschool untilthe fever is gonefor at least 50full day. PREVENTION  An annual influenza vaccination (flu shot) is the best way to avoid getting influenza. An annual flu shot is now routinely recommended for all adults in the [place] IF:  You experiencechest pain, yourcough worsens,or you producemore mucus.  Youhave nausea,vomiting, ordiarrhea.  Your fever returns or gets worse. SEEK IMMEDIATE MEDICAL CARE IF:  You havetrouble breathing, you become short of breath,or your skin ornails becomebluish.  You have severe painor stiffnessin the neck.  You develop a sudden headache, or pain in the face or ear.  You have nausea or vomiting that you cannot control. MAKE SURE YOU:   Understand these instructions.  Will watch your condition.  Will get help right away if you are not doing well or get worse.   This information is not intended to replace advice given to you by your health care provider. Make sure you discuss any questions you have with your health care provider.   Document Released: 07/02/2000 Document Revised: 07/26/2014 Document Reviewed: 10/04/2011 Elsevier Interactive Patient Education [employer].

## 2015-09-24 NOTE — ED Notes (Addendum)
Per EMS: cough, cold like sxs x 2 days, contact with person in family who has the flu. N/d no vomiting. 22g left hand. 1000 mg tylenol in route, 200 cc of NS.

## 2015-09-24 NOTE — ED Provider Notes (Signed)
CSN: [id number]     Arrival date & time 09/24/15  1702 History   First MD Initiated Contact with Patient 09/24/15 1724     Chief Complaint  Patient presents with  . Fever     (Consider location/radiation/quality/duration/timing/severity/associated sxs/prior Treatment) HPI [name] [name] is a 55 y.o. femalewith history of diabetes, hypertension, anemia, presents to emergency department complaining of fever, chills, nausea, diarrhea, abdominal pain since yesterday. Patient states fever subjective, did not check. She reports that she has been having chills. She reports nasal congestion and watery eyes. She reports epigastric abdominal pain that radiates all over. She reports nausea, she has not thrown up. She reports several episodes of watery diarrhea. She states she has generalized weakness and fatigue. She has taken Benadryl and Tylenol for her symptoms with no relief. She reports that her sister is sick with flu. She denies cough, although she admitted it to EMS. She denies sore throat. No other complaints.   Past Medical History  Diagnosis Date  . Diabetes mellitus   . Hypertension   . Fibroids   . Anemia   . MRSA (methicillin resistant Staphylococcus aureus)   . Trichomonas    Past Surgical History  Procedure Laterality Date  . Cesarean section      breech   Family History  Problem Relation Age of Onset  . Other Neg Hx    Social History  Substance Use Topics  . Smoking status: Never Smoker   . Smokeless tobacco: None  . Alcohol Use: Yes   OB History    Gravida Para Term Preterm AB TAB SAB Ectopic Multiple Living   1 1 1       1      Review of Systems  Constitutional: Positive for fever, chills and fatigue.  HENT: Positive for congestion. Negative for sore throat.   Respiratory: Negative for cough, chest tightness and shortness of breath.   Cardiovascular: Negative for chest pain, palpitations and leg swelling.  Gastrointestinal: Positive for nausea, abdominal pain and  diarrhea. Negative for vomiting.  Genitourinary: Negative for dysuria, flank pain, vaginal bleeding, vaginal discharge, vaginal pain and pelvic pain.  Musculoskeletal: Positive for myalgias. Negative for neck pain and neck stiffness.  Skin: Negative for rash.  Neurological: Positive for weakness, light-headedness and headaches. Negative for dizziness.  All other systems reviewed and are negative.     Allergies  Review of patient's allergies indicates no known allergies.  Home Medications   Prior to Admission medications   Medication Sig Start Date End Date Taking? Authorizing Provider  ACCU-CHEK SOFTCLIX LANCETS lancets Use to test blood sugar 3 times daily as instructed. Dx code: 250.50 04/01/14  Yes Philemon Kingdom, MD  gabapentin (NEURONTIN) 300 MG capsule Take 1 tablet on day 1 at night. Take 1 tablet in the morning and 1 tablet at night on day 2. Take 1 tablet 3 times a day on day 3. May increase 1 tablet per day to a maximum of 9 tablets per day. 06/07/15  Yes Leonard Schwartz, MD  glucose blood (ACCU-CHEK AVIVA PLUS) test strip Use to test blood sugar 3 times daily as instructed. Dx code: E11.319 05/13/14  Yes Philemon Kingdom, MD  insulin aspart (NOVOLOG FLEXPEN) 100 UNIT/ML FlexPen Inject 14-20 Units into the skin 3 (three) times daily with meals. Use sliding scale as instructed. 11/04/14  Yes Philemon Kingdom, MD  Insulin Glargine (LANTUS SOLOSTAR) 100 UNIT/ML Solostar Pen Inject 40 Units into the skin at bedtime. Patient taking differently: Inject 24 Units into the  skin at bedtime.  05/13/14  Yes Philemon Kingdom, MD  Insulin Pen Needle (BD PEN NEEDLE NANO U/F) 32G X 4 MM MISC Use to inject insulin 4 times daily. 05/13/14  Yes Philemon Kingdom, MD  metFORMIN (GLUCOPHAGE) 1000 MG tablet Take 1,000 mg by mouth 2 (two) times daily with a meal.   Yes Historical Provider, MD  nitroGLYCERIN (NITROSTAT) 0.4 MG SL tablet Place 0.4 mg under the tongue every 5 (five) minutes as needed for  chest pain.   Yes Historical Provider, MD  amitriptyline (ELAVIL) 25 MG tablet Take 1 tablet (25 mg total) by mouth at bedtime. Patient not taking: Reported on 09/24/2015 04/30/15   Delos Haring, PA-C  azithromycin (ZITHROMAX) 250 MG tablet Take 1 tablet (250 mg total) by mouth daily. Take 1 tab every day until finished. Patient not taking: Reported on 09/24/2015 05/01/15   Delos Haring, PA-C  guaiFENesin-codeine 100-10 MG/5ML syrup Take 5 mLs by mouth 3 (three) times daily as needed for cough. Patient not taking: Reported on 09/24/2015 04/30/15   Delos Haring, PA-C  meloxicam (MOBIC) 15 MG tablet Take 1 tablet (15 mg total) by mouth daily. Patient not taking: Reported on 09/24/2015 07/14/15   Gardiner Barefoot, DPM   BP 154/89 mmHg  Pulse 89  Temp(Src) 99.5 F (37.5 C) (Oral)  Resp 16  SpO2 96%  LMP 04/01/2010 Physical Exam  Constitutional: She appears well-developed and well-nourished. No distress.  HENT:  Head: Normocephalic and atraumatic.  Right Ear: Tympanic membrane, external ear and ear canal normal.  Left Ear: Tympanic membrane, external ear and ear canal normal.  Nose: Mucosal edema and rhinorrhea present.  Mouth/Throat: Uvula is midline and oropharynx is clear and moist.  Eyes: Conjunctivae are normal.  Neck: Neck supple.  Cardiovascular: Normal rate, regular rhythm and normal heart sounds.   Pulmonary/Chest: Effort normal and breath sounds normal. No respiratory distress. She has no wheezes. She has no rales.  Abdominal: Soft. Bowel sounds are normal. She exhibits no distension. There is no tenderness. There is no rebound and no guarding.  Musculoskeletal: She exhibits no edema.  Neurological: She is alert.  Skin: Skin is warm and dry.  Psychiatric: She has a normal mood and affect. Her behavior is normal.  Nursing note and vitals reviewed.   ED Course  Procedures (including critical care time) Labs Review Labs Reviewed  CBC WITH DIFFERENTIAL/PLATELET - Abnormal;  Notable for the following:    Hemoglobin 11.6 (*)    HCT 35.3 (*)    All other components within normal limits  COMPREHENSIVE METABOLIC PANEL - Abnormal; Notable for the following:    Glucose, Bld 200 (*)    All other components within normal limits  URINALYSIS, ROUTINE W REFLEX MICROSCOPIC (NOT AT [facility]) - Abnormal; Notable for the following:    Glucose, UA 500 (*)    Hgb urine dipstick SMALL (*)    Protein, ur 100 (*)    All other components within normal limits  URINE MICROSCOPIC-ADD ON - Abnormal; Notable for the following:    Squamous Epithelial / LPF 0-5 (*)    Bacteria, UA MANY (*)    Casts HYALINE CASTS (*)    All other components within normal limits  CBG MONITORING, ED - Abnormal; Notable for the following:    Glucose-Capillary 191 (*)    All other components within normal limits  I-STAT CG4 LACTIC ACID, ED - Abnormal; Notable for the following:    Lactic Acid, Venous 2.67 (*)    All other components  within normal limits  URINE CULTURE  I-STAT CG4 LACTIC ACID, ED    Imaging Review Dg Chest 2 View  09/24/2015  CLINICAL DATA:  Cough, fever, and weakness for 2 days. EXAM: CHEST  2 VIEW COMPARISON:  04/30/2015 FINDINGS: Lung volumes are low. The cardiomediastinal contours are unchanged, heart at the upper limits normal in size, stable. The lungs are clear. Pulmonary vasculature is normal. No consolidation, pleural effusion, or pneumothorax. No acute osseous abnormalities are seen. IMPRESSION: No acute pulmonary process. Electronically Signed   By: Jeb Levering M.D.   On: 09/24/2015 18:53   I have personally reviewed and evaluated these images and lab results as part of my medical decision-making.   EKG Interpretation None      MDM   Final diagnoses:  Flu-like symptoms    patient emergency department with subjective fever, chills, nasal congestion,nausea, diarrhea, diffuse abdominal pain. Patient is afebrile here. She did receive 1 g of Tylenol by EMS. Not sure what  her temperature was prior to this. Her exam is unremarkable other than nasal congestion and generalized malaise. No meningismus. Abdomen is non tender. Patient has had a close contact with her sister who has flu. Will check labs, IV fluids started, will check urinalysis, chest x-ray.  Pt's initial lactic acid elevated at 2.67. Her heart rate normalized with fluids. BP normal. Pt eating and derinking with no difficulty. Receiving IV fluids. Will recheck lactic acid after fluids.    11:28 PM Pt's recheck lactic acid is 0.52. UA with many bacteria, but negative for nitrite and leukocyte. Will send culture. Pt's VS are normal. She is tolerating POs. Her WBC is normal. Normal electrolytes except for glucose of 200. Her symptoms are most likely due to a viral illness possibly influenza. Will start on tamiflu. Home with zofran. Follow up with pcp. Return precautions discussed.   Filed Vitals:   09/24/15 1731 09/24/15 2041 09/24/15 2100 09/24/15 2200  BP: 155/90 154/89 140/86 135/74  Pulse: 109 89 92 92  Temp: 99.5 F (37.5 C)     TempSrc: Oral     Resp: 24 16  16   SpO2: 95% 96% 97% 100%     Jeannett Senior, PA-C 09/25/15 0059  Daleen Bo, MD 09/27/15 2138

## 2015-09-26 LAB — URINE CULTURE

## 2016-04-06 ENCOUNTER — Encounter (HOSPITAL_COMMUNITY): Payer: Self-pay | Admitting: Emergency Medicine

## 2016-04-06 ENCOUNTER — Ambulatory Visit (HOSPITAL_COMMUNITY)
Admission: EM | Admit: 2016-04-06 | Discharge: 2016-04-06 | Disposition: A | Discharge status: Home / Self Care | Payer: Medicaid Other | Attending: Internal Medicine | Admitting: Internal Medicine

## 2016-04-06 DIAGNOSIS — R6889 Other general symptoms and signs: Secondary | ICD-10-CM | POA: Diagnosis not present

## 2016-04-06 MED ORDER — OSELTAMIVIR PHOSPHATE 75 MG PO CAPS: 75.0000 mg | ORAL_CAPSULE | Freq: Two times a day (BID) | ORAL | 0 refills | Status: DC

## 2016-04-06 NOTE — ED Triage Notes (Signed)
Patient reports onset of symptoms on Saturday:  Aching, sneezing, runny nose, watery eyes, and headaches.  Unknown if she has had fever.  Patient reports a productive cough, and chest soreness with coughing.

## 2016-04-06 NOTE — ED Provider Notes (Signed)
[place]    CSN: [id number] Arrival date & time: 04/06/16  1538  First Provider Contact:  None       History   Chief Complaint Chief Complaint  Patient presents with  . URI    HPI [name] [name] is a 55 y.o. female.   C/o runy nose, body aches, and chills x4 days.  Admits to cough as well.  Sometimes productive of clear phlegm.  Patient states that she can feel it draining from her head.  Mucus is thin and non-purulent.      Past Medical History:  Diagnosis Date  . Anemia   . Diabetes mellitus   . Fibroids   . Hypertension   . MRSA (methicillin resistant Staphylococcus aureus)   . Trichomonas     Patient Active Problem List   Diagnosis Date Noted  . 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 Abnormal uterine bleeding 02/07/2012  . Ovarian cyst 02/07/2012  . ACUTE CYSTITIS 06/29/2010  . BREAST PAIN, BILATERAL 02/19/2010  . HOT FLASHES 11/03/2009  . ANEMIA, IRON DEFICIENCY 10/03/2009  . LEUKOPENIA, MILD 09/19/2009  . CHEST PAIN UNSPECIFIED 09/19/2009  . 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 LIPOMA 06/18/2009  . 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 TINEA PEDIS 05/07/2009  . SKIN LESION 05/07/2009  . COUGH 05/07/2009  . VAGINITIS, BACTERIAL, RECURRENT 08/15/2007  . LEIOMYOMA, UTERUS 07/07/2007  . CANDIDIASIS OF VULVA AND VAGINA 06/20/2007  . TRICHOMONAL VAGINITIS 06/13/2007  . URI 04/04/2007  . SUBSTANCE ABUSE, MULTIPLE 02/22/2007  . HYPERTENSION 02/22/2007    Past Surgical History:  Procedure Laterality Date  . CESAREAN SECTION     breech    OB History    Gravida Para Term Preterm AB Living   1 1 1     1    SAB TAB Ectopic Multiple Live Births           1       Home Medications    Prior to Admission medications   Medication Sig Start Date End Date Taking? Authorizing Provider  ACCU-CHEK SOFTCLIX LANCETS lancets Use to test blood sugar 3 times daily as instructed. Dx code: 250.50 04/01/14   Philemon Kingdom, MD    amitriptyline (ELAVIL) 25 MG tablet Take 1 tablet (25 mg total) by mouth at bedtime. Patient not taking: Reported on 04/06/2016 04/30/15   Delos Haring, PA-C  gabapentin (NEURONTIN) 300 MG capsule Take 1 tablet on day 1 at night. Take 1 tablet in the morning and 1 tablet at night on day 2. Take 1 tablet 3 times a day on day 3. May increase 1 tablet per day to a maximum of 9 tablets per day. 06/07/15   Leonard Schwartz, MD  glucose blood (ACCU-CHEK AVIVA PLUS) test strip Use to test blood sugar 3 times daily as instructed. Dx code: E11.319 05/13/14   Philemon Kingdom, MD  insulin aspart (NOVOLOG FLEXPEN) 100 UNIT/ML FlexPen Inject 14-20 Units into the skin 3 (three) times daily with meals. Use sliding scale as instructed. 11/04/14   Philemon Kingdom, MD  Insulin Glargine (LANTUS SOLOSTAR) 100 UNIT/ML Solostar Pen Inject 40 Units into the skin at bedtime. Patient taking differently: Inject 24 Units into the skin at bedtime.  05/13/14   Philemon Kingdom, MD  Insulin Pen Needle (BD PEN NEEDLE NANO U/F) 32G X 4 MM MISC Use to inject insulin 4 times daily. 05/13/14   Philemon Kingdom, MD  meloxicam (MOBIC) 15 MG tablet Take 1 tablet (15 mg total) by mouth daily. Patient not  taking: Reported on 04/06/2016 07/14/15   Gardiner Barefoot, DPM  metFORMIN (GLUCOPHAGE) 1000 MG tablet Take 1,000 mg by mouth 2 (two) times daily with a meal.    Historical Provider, MD  nitroGLYCERIN (NITROSTAT) 0.4 MG SL tablet Place 0.4 mg under the tongue every 5 (five) minutes as needed for chest pain.    Historical Provider, MD  ondansetron (ZOFRAN ODT) 8 MG disintegrating tablet Take 1 tablet (8 mg total) by mouth every 8 (eight) hours as needed for nausea or vomiting. 09/24/15   Tatyana Kirichenko, PA-C  oseltamivir (TAMIFLU) 75 MG capsule Take 1 capsule (75 mg total) by mouth every 12 (twelve) hours. 04/06/16   Harrie Foreman, MD    Family History Family History  Problem Relation Age of Onset  . Other Neg Hx     Social  History Social History  Substance Use Topics  . Smoking status: Never Smoker  . Smokeless tobacco: Not on file  . Alcohol use Yes     Allergies   Review of patient's allergies indicates no known allergies.   Review of Systems Review of Systems  Constitutional: Positive for chills and fatigue. Negative for fever.  HENT: Positive for rhinorrhea. Negative for ear pain, sinus pressure and sore throat.   Eyes: Negative for pain and visual disturbance.  Respiratory: Positive for cough. Negative for shortness of breath.   Cardiovascular: Negative for chest pain and palpitations.  Gastrointestinal: Negative for abdominal pain and vomiting.  Genitourinary: Negative for dysuria and hematuria.  Musculoskeletal: Negative for arthralgias and back pain.  Skin: Negative for color change and rash.  Neurological: Negative for seizures and syncope.  All other systems reviewed and are negative.    Physical Exam Triage Vital Signs ED Triage Vitals  Enc Vitals Group     BP 04/06/16 1741 144/88     Pulse Rate 04/06/16 1741 94     Resp 04/06/16 1741 20     Temp 04/06/16 1741 98.4 F (36.9 C)     Temp Source 04/06/16 1741 Oral     SpO2 04/06/16 1741 98 %     Weight --      Height --      Head Circumference --      Peak Flow --      Pain Score 04/06/16 1802 8     Pain Loc --      Pain Edu? --      Excl. in [place]? --    No data found.   Updated Vital Signs BP 144/88 (BP Location: Left Arm)   Pulse 94   Temp 98.4 F (36.9 C) (Oral)   Resp 20   LMP 04/01/2010   SpO2 98%   Visual Acuity Right Eye Distance:   Left Eye Distance:   Bilateral Distance:    Right Eye Near:   Left Eye Near:    Bilateral Near:     Physical Exam  Constitutional: She is oriented to person, place, and time. She appears well-developed and well-nourished. No distress.  HENT:  Head: Normocephalic and atraumatic.  Mouth/Throat: Oropharynx is clear and moist.  Eyes: Conjunctivae and EOM are normal. Pupils  are equal, round, and reactive to light. No scleral icterus.  Neck: Normal range of motion. Neck supple. No JVD present. No tracheal deviation present. No thyromegaly present.  Cardiovascular: Normal rate, regular rhythm and normal heart sounds.  Exam reveals no gallop and no friction rub.   No murmur heard. Pulmonary/Chest: Effort normal and breath sounds normal.  Abdominal: Soft. Bowel sounds are normal. She exhibits no distension. There is no tenderness.  Musculoskeletal: Normal range of motion. She exhibits no edema.  Lymphadenopathy:    She has no cervical adenopathy.  Neurological: She is alert and oriented to person, place, and time. No cranial nerve deficit.  Skin: Skin is warm and dry.  Psychiatric: She has a normal mood and affect. Her behavior is normal. Judgment and thought content normal.  Nursing note and vitals reviewed.    UC Treatments / Results  Labs (all labs ordered are listed, but only abnormal results are displayed) Labs Reviewed - No data to display  EKG  EKG Interpretation None       Radiology No results found.  Procedures Procedures (including critical care time)  Medications Ordered in UC Medications - No data to display   Initial Impression / Assessment and Plan / UC Course  I have reviewed the triage vital signs and the nursing notes.  Pertinent labs & imaging results that were available during my care of the patient were reviewed by me and considered in my medical decision making (see chart for details).  Clinical Course    Sx likely viral; treat empirically for flu and test sensitivity is low as it is early in flu season. Does not meet clinical criteria for pneumonia.  Final Clinical Impressions(s) / UC Diagnoses   Final diagnoses:  Flu-like symptoms    New Prescriptions Discharge Medication List as of 04/06/2016  6:36 PM       Harrie Foreman, MD 04/06/16 1910

## 2016-04-07 ENCOUNTER — Emergency Department (HOSPITAL_COMMUNITY)
Admission: EM | Admit: 2016-04-07 | Discharge: 2016-04-07 | Disposition: A | Discharge status: Home / Self Care | Payer: Medicaid Other | Attending: Emergency Medicine | Admitting: Emergency Medicine

## 2016-04-07 ENCOUNTER — Encounter (HOSPITAL_COMMUNITY): Payer: Self-pay | Admitting: Emergency Medicine

## 2016-04-07 ENCOUNTER — Telehealth (HOSPITAL_COMMUNITY): Payer: Self-pay | Admitting: Emergency Medicine

## 2016-04-07 ENCOUNTER — Emergency Department (HOSPITAL_COMMUNITY): Payer: Medicaid Other

## 2016-04-07 DIAGNOSIS — Z7984 Long term (current) use of oral hypoglycemic drugs: Secondary | ICD-10-CM | POA: Insufficient documentation

## 2016-04-07 DIAGNOSIS — E119 Type 2 diabetes mellitus without complications: Secondary | ICD-10-CM | POA: Insufficient documentation

## 2016-04-07 DIAGNOSIS — I10 Essential (primary) hypertension: Secondary | ICD-10-CM | POA: Diagnosis not present

## 2016-04-07 DIAGNOSIS — Z794 Long term (current) use of insulin: Secondary | ICD-10-CM | POA: Insufficient documentation

## 2016-04-07 DIAGNOSIS — Z79899 Other long term (current) drug therapy: Secondary | ICD-10-CM | POA: Diagnosis not present

## 2016-04-07 DIAGNOSIS — J069 Acute upper respiratory infection, unspecified: Secondary | ICD-10-CM | POA: Insufficient documentation

## 2016-04-07 DIAGNOSIS — R0981 Nasal congestion: Secondary | ICD-10-CM | POA: Diagnosis present

## 2016-04-07 LAB — CBC WITH DIFFERENTIAL/PLATELET
BASOS ABS: 0 10*3/uL (ref 0.0–0.1)
Basophils Relative: 0 %
EOS ABS: 0 10*3/uL (ref 0.0–0.7)
EOS PCT: 0 %
HCT: 38.4 % (ref 36.0–46.0)
Hemoglobin: 12.3 g/dL (ref 12.0–15.0)
Lymphocytes Relative: 49 %
Lymphs Abs: 1.7 10*3/uL (ref 0.7–4.0)
MCH: 26.1 pg (ref 26.0–34.0)
MCHC: 32 g/dL (ref 30.0–36.0)
MCV: 81.5 fL (ref 78.0–100.0)
Monocytes Absolute: 0.3 10*3/uL (ref 0.1–1.0)
Monocytes Relative: 10 %
Neutro Abs: 1.4 10*3/uL — ABNORMAL LOW (ref 1.7–7.7)
Neutrophils Relative %: 41 %
PLATELETS: 233 10*3/uL (ref 150–400)
RBC: 4.71 MIL/uL (ref 3.87–5.11)
RDW: 14.2 % (ref 11.5–15.5)
WBC: 3.4 10*3/uL — AB (ref 4.0–10.5)

## 2016-04-07 LAB — BASIC METABOLIC PANEL
Anion gap: 8 (ref 5–15)
BUN: 10 mg/dL (ref 6–20)
CO2: 30 mmol/L (ref 22–32)
Calcium: 9.1 mg/dL (ref 8.9–10.3)
Chloride: 100 mmol/L — ABNORMAL LOW (ref 101–111)
Creatinine, Ser: 0.68 mg/dL (ref 0.44–1.00)
GFR calc Af Amer: >60 mL/min (ref >60)
Glucose, Bld: 326 mg/dL — ABNORMAL HIGH (ref 65–99)
POTASSIUM: 4.3 mmol/L (ref 3.5–5.1)
Sodium: 138 mmol/L (ref 135–145)

## 2016-04-07 LAB — I-STAT CG4 LACTIC ACID, ED: LACTIC ACID, VENOUS: 0.9 mmol/L (ref 0.5–1.9)

## 2016-04-07 MED ORDER — ALBUTEROL SULFATE HFA 108 (90 BASE) MCG/ACT IN AERS
2.0000 | INHALATION_SPRAY | Freq: Once | RESPIRATORY_TRACT | Status: AC
  Administered 2016-04-07: 2 via RESPIRATORY_TRACT
  Filled 2016-04-07: qty 6.7

## 2016-04-07 MED ORDER — IPRATROPIUM-ALBUTEROL 0.5-2.5 (3) MG/3ML IN SOLN
3.0000 mL | Freq: Once | RESPIRATORY_TRACT | Status: AC
  Administered 2016-04-07: 3 mL via RESPIRATORY_TRACT
  Filled 2016-04-07: qty 3

## 2016-04-07 NOTE — ED Notes (Signed)
No answer in lobby, called x 2 no answer

## 2016-04-07 NOTE — ED Notes (Signed)
Patient was sleeping in waiting area and didn't hear her name

## 2016-04-07 NOTE — ED Triage Notes (Signed)
Pt states she has had flu-like symptoms- cough, nasal congestion, headache, clear eye drainage since Saturday. Pt states she vomited Saturday as well and has some diarrhea.

## 2016-04-07 NOTE — ED Provider Notes (Signed)
[facility] DEPT Provider Note   CSN: [id number] Arrival date & time: 04/07/16  5035     History   Chief Complaint Chief Complaint  Patient presents with  . Nasal Congestion  . Headache    HPI  Blood pressure 154/100, pulse 100, temperature 97.7 F (36.5 C), temperature source Oral, resp. rate 20, height 5\' 6"  (1.676 m), weight 97.5 kg, last menstrual period 04/01/2010, SpO2 94 %.  [name] [name] is a 55 y.o. female with past medical history significant for insulin-dependent diabetes, hypertension complaining of runny nose, headache, myalgia, chills, productive cough onset 4 days ago. Patient has sick contact in that her cousin is also sick with a cold. She's had diarrhea and single episode of of emesis at the onset of symptoms 4 days ago,  which was nonbloody, nonbilious, non-coffee ground. Patient states she was seen at urgent care yesterday, diagnosed with the flu, states her insurance will not pay for the Tamiflu. Her main complaint at this time is runny nose.  HPI  Past Medical History:  Diagnosis Date  . Anemia   . Diabetes mellitus   . Fibroids   . Hypertension   . MRSA (methicillin resistant Staphylococcus aureus)   . Trichomonas     Patient Active Problem List   Diagnosis Date Noted  . 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 Abnormal uterine bleeding 02/07/2012  . Ovarian cyst 02/07/2012  . ACUTE CYSTITIS 06/29/2010  . BREAST PAIN, BILATERAL 02/19/2010  . HOT FLASHES 11/03/2009  . ANEMIA, IRON DEFICIENCY 10/03/2009  . LEUKOPENIA, MILD 09/19/2009  . CHEST PAIN UNSPECIFIED 09/19/2009  . 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 LIPOMA 06/18/2009  . 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 TINEA PEDIS 05/07/2009  . SKIN LESION 05/07/2009  . COUGH 05/07/2009  . VAGINITIS, BACTERIAL, RECURRENT 08/15/2007  . LEIOMYOMA, UTERUS 07/07/2007  . CANDIDIASIS OF VULVA AND VAGINA 06/20/2007  . TRICHOMONAL VAGINITIS 06/13/2007  . URI 04/04/2007  . SUBSTANCE ABUSE,  MULTIPLE 02/22/2007  . HYPERTENSION 02/22/2007    Past Surgical History:  Procedure Laterality Date  . CESAREAN SECTION     breech    OB History    Gravida Para Term Preterm AB Living   1 1 1     1    SAB TAB Ectopic Multiple Live Births           1       Home Medications    Prior to Admission medications   Medication Sig Start Date End Date Taking? Authorizing Provider  amLODipine (NORVASC) 10 MG tablet Take 10 mg by mouth daily.   Yes Historical Provider, MD  gabapentin (NEURONTIN) 300 MG capsule Take 1 tablet on day 1 at night. Take 1 tablet in the morning and 1 tablet at night on day 2. Take 1 tablet 3 times a day on day 3. May increase 1 tablet per day to a maximum of 9 tablets per day. Patient taking differently: Take 600 mg by mouth 2 (two) times daily.  06/07/15  Yes Leonard Schwartz, MD  insulin aspart (NOVOLOG FLEXPEN) 100 UNIT/ML FlexPen Inject 14-20 Units into the skin 3 (three) times daily with meals. Use sliding scale as instructed. 11/04/14  Yes Philemon Kingdom, MD  Insulin Glargine (LANTUS SOLOSTAR) 100 UNIT/ML Solostar Pen Inject 40 Units into the skin at bedtime. Patient taking differently: Inject 24 Units into the skin at bedtime.  05/13/14  Yes Philemon Kingdom, MD  metFORMIN (GLUCOPHAGE) 1000 MG tablet Take 1,000 mg by mouth 2 (two) times daily with  a meal.   Yes Historical Provider, MD  nitroGLYCERIN (NITROSTAT) 0.4 MG SL tablet Place 0.4 mg under the tongue every 5 (five) minutes as needed for chest pain.   Yes Historical Provider, MD  ACCU-CHEK SOFTCLIX LANCETS lancets Use to test blood sugar 3 times daily as instructed. Dx code: 66.50 Patient not taking: Reported on 04/07/2016 04/01/14   Philemon Kingdom, MD  amitriptyline (ELAVIL) 25 MG tablet Take 1 tablet (25 mg total) by mouth at bedtime. Patient not taking: Reported on 04/07/2016 04/30/15   Delos Haring, PA-C  glucose blood (ACCU-CHEK AVIVA PLUS) test strip Use to test blood sugar 3 times daily as  instructed. Dx code: E11.319 Patient not taking: Reported on 04/07/2016 05/13/14   Philemon Kingdom, MD  Insulin Pen Needle (BD PEN NEEDLE NANO U/F) 32G X 4 MM MISC Use to inject insulin 4 times daily. Patient not taking: Reported on 04/07/2016 05/13/14   Philemon Kingdom, MD  meloxicam (MOBIC) 15 MG tablet Take 1 tablet (15 mg total) by mouth daily. Patient not taking: Reported on 04/07/2016 07/14/15   Gardiner Barefoot, DPM  ondansetron (ZOFRAN ODT) 8 MG disintegrating tablet Take 1 tablet (8 mg total) by mouth every 8 (eight) hours as needed for nausea or vomiting. Patient not taking: Reported on 04/07/2016 09/24/15   Jeannett Senior, PA-C  oseltamivir (TAMIFLU) 75 MG capsule Take 1 capsule (75 mg total) by mouth every 12 (twelve) hours. Patient not taking: Reported on 04/07/2016 04/06/16   Harrie Foreman, MD    Family History Family History  Problem Relation Age of Onset  . Other Neg Hx     Social History Social History  Substance Use Topics  . Smoking status: Never Smoker  . Smokeless tobacco: Never Used  . Alcohol use No     Allergies   Review of patient's allergies indicates no known allergies.   Review of Systems Review of Systems  10 systems reviewed and found to be negative, except as noted in the HPI.   Physical Exam Updated Vital Signs BP 152/91 (BP Location: Right Arm)   Pulse 100   Temp 97.7 F (36.5 C) (Oral)   Resp 20   Ht 5\' 6"  (1.676 m)   Wt 97.5 kg   LMP 04/01/2010   SpO2 98%   BMI 34.70 kg/m   Physical Exam  Constitutional: She is oriented to person, place, and time. She appears well-developed and well-nourished. No distress.  HENT:  Head: Normocephalic and atraumatic.  Right Ear: External ear normal.  Left Ear: External ear normal.  Mouth/Throat: Oropharynx is clear and moist. No oropharyngeal exudate.  No drooling or stridor. Posterior pharynx mildly erythematous no significant tonsillar hypertrophy. No exudate. Soft palate rises  symmetrically. No TTP or induration under tongue.   No tenderness to palpation of frontal or bilateral maxillary sinuses.  Mild mucosal edema in the nares with scant rhinorrhea.  Bilateral tympanic membranes with normal architecture and good light reflex.    Eyes: Conjunctivae and EOM are normal. Pupils are equal, round, and reactive to light.  No TTP of maxillary or frontal sinuses  No TTP or induration of temporal arteries bilaterally  Neck: Normal range of motion. Neck supple.  FROM to C-spine. Pt can touch chin to chest without discomfort. No TTP of midline cervical spine.   Cardiovascular: Normal rate, regular rhythm and intact distal pulses.   Pulmonary/Chest: Effort normal and breath sounds normal. No stridor. No respiratory distress. She has no wheezes. She has no rales. She exhibits  no tenderness.  Abdominal: Soft. Bowel sounds are normal. There is no tenderness. There is no rebound and no guarding.  Musculoskeletal: Normal range of motion. She exhibits no edema or tenderness.  Neurological: She is alert and oriented to person, place, and time. No cranial nerve deficit.  II-Visual fields grossly intact. III/IV/VI-Extraocular movements intact.  Pupils reactive bilaterally. V/VII-Smile symmetric, equal eyebrow raise,  facial sensation intact VIII- Hearing grossly intact IX/X-Normal gag XI-bilateral shoulder shrug XII-midline tongue extension Motor: 5/5 bilaterally with normal tone and bulk Cerebellar: Normal finger-to-nose  and normal heel-to-shin test.   Romberg negative Ambulates with a coordinated gait   Skin: She is not diaphoretic.  Psychiatric: She has a normal mood and affect.  Nursing note and vitals reviewed.    ED Treatments / Results  Labs (all labs ordered are listed, but only abnormal results are displayed) Labs Reviewed  CBC WITH DIFFERENTIAL/PLATELET - Abnormal; Notable for the following:       Result Value   WBC 3.4 (*)    Neutro Abs 1.4 (*)     All other components within normal limits  BASIC METABOLIC PANEL - Abnormal; Notable for the following:    Chloride 100 (*)    Glucose, Bld 326 (*)    All other components within normal limits  I-STAT CG4 LACTIC ACID, ED    EKG  EKG Interpretation None       Radiology Dg Chest 2 View  Result Date: 04/07/2016 CLINICAL DATA:  Chest pain and cough today. EXAM: CHEST  2 VIEW COMPARISON:  09/24/2015 FINDINGS: Heart size is normal. The aorta is unfolded. The lungs are clear. The vascularity is normal. No effusions. No acute bone finding. IMPRESSION: No active cardiopulmonary disease. Electronically Signed   By: Nelson Chimes M.D.   On: 04/07/2016 15:01    Procedures Procedures (including critical care time)  Medications Ordered in ED Medications  ipratropium-albuterol (DUONEB) 0.5-2.5 (3) MG/3ML nebulizer solution 3 mL (3 mLs Nebulization Given 04/07/16 1406)  albuterol (PROVENTIL HFA;VENTOLIN HFA) 108 (90 Base) MCG/ACT inhaler 2 puff (2 puffs Inhalation Given 04/07/16 1606)     Initial Impression / Assessment and Plan / ED Course  I have reviewed the triage vital signs and the nursing notes.  Pertinent labs & imaging results that were available during my care of the patient were reviewed by me and considered in my medical decision making (see chart for details).  Clinical Course    Vitals:   04/07/16 0934 04/07/16 0940 04/07/16 1258 04/07/16 1611  BP: 157/85  154/100 152/91  Pulse: 104  100   Resp: 20  20   Temp: 97.7 F (36.5 C)     TempSrc: Oral     SpO2: 93%  94% 98%  Weight:  97.5 kg    Height:  5\' 6"  (1.676 m)      Medications  ipratropium-albuterol (DUONEB) 0.5-2.5 (3) MG/3ML nebulizer solution 3 mL (3 mLs Nebulization Given 04/07/16 1406)  albuterol (PROVENTIL HFA;VENTOLIN HFA) 108 (90 Base) MCG/ACT inhaler 2 puff (2 puffs Inhalation Given 04/07/16 1606)    [name] [name] is 55 y.o. female presenting with Upper respiratory symptoms including rhinorrhea, frontal  headache, productive cough, shortness of breath. Patient is saturating well on room air. She has a mild tachycardia, will give DuoNeb and check basic blood work.  Blood work reassuring, does have an elevated CBG but a normal anion gap, chest x-ray without infiltrate, patient is been saturating well on room air, I don't think it is critical that  she start Tamiflu at this time, I think this is more likely a nonspecific viral infection.Patient is given an inhaler in the ED.   Evaluation does not show pathology that would require ongoing emergent intervention or inpatient treatment. Pt is hemodynamically stable and mentating appropriately. Discussed findings and plan with patient/guardian, who agrees with care plan. All questions answered. Return precautions discussed and outpatient follow up given.   Final Clinical Impressions(s) / ED Diagnoses   Final diagnoses:  URI (upper respiratory infection)    New Prescriptions New Prescriptions   No medications on file     Monico Blitz, PA-C 04/07/16 [place], MD 04/08/16 [phone]

## 2016-04-07 NOTE — Discharge Instructions (Signed)
Please follow with your primary care doctor in the next 2 days for a check-up. They must obtain records for further management.  ° °Do not hesitate to return to the Emergency Department for any new, worsening or concerning symptoms.  ° °

## 2016-04-07 NOTE — Telephone Encounter (Signed)
Pt called wanting to let us know that Medicaid will not cover Tamiflu and wanted to know if we could call in something else.... She was seen yesterday by Dr. Marcille Blanco  Per Dr Juventino Slovak...  There is no other med for flu and it is best if taken w/in 24 hours after being dx.  Pt was also seen earlier this morning at [facility] ED but LWBS.   Pt was mad and upset and hanged the phone.

## 2016-06-16 ENCOUNTER — Encounter (HOSPITAL_COMMUNITY): Payer: Self-pay

## 2016-06-16 ENCOUNTER — Emergency Department (HOSPITAL_COMMUNITY): Payer: Medicaid Other

## 2016-06-16 ENCOUNTER — Emergency Department (HOSPITAL_COMMUNITY)
Admission: EM | Admit: 2016-06-16 | Discharge: 2016-06-16 | Disposition: A | Discharge status: Home / Self Care | Payer: Medicaid Other | Attending: Emergency Medicine | Admitting: Emergency Medicine

## 2016-06-16 DIAGNOSIS — E119 Type 2 diabetes mellitus without complications: Secondary | ICD-10-CM | POA: Insufficient documentation

## 2016-06-16 DIAGNOSIS — I10 Essential (primary) hypertension: Secondary | ICD-10-CM | POA: Diagnosis not present

## 2016-06-16 DIAGNOSIS — R079 Chest pain, unspecified: Secondary | ICD-10-CM

## 2016-06-16 DIAGNOSIS — R0602 Shortness of breath: Secondary | ICD-10-CM | POA: Diagnosis not present

## 2016-06-16 DIAGNOSIS — Z794 Long term (current) use of insulin: Secondary | ICD-10-CM | POA: Insufficient documentation

## 2016-06-16 LAB — CBC WITH DIFFERENTIAL/PLATELET
BASOS PCT: 0 %
Basophils Absolute: 0 10*3/uL (ref 0.0–0.1)
Eosinophils Absolute: 0 10*3/uL (ref 0.0–0.7)
Eosinophils Relative: 0 %
HEMATOCRIT: 33.8 % — AB (ref 36.0–46.0)
Hemoglobin: 11.2 g/dL — ABNORMAL LOW (ref 12.0–15.0)
Lymphocytes Relative: 47 %
Lymphs Abs: 2.4 10*3/uL (ref 0.7–4.0)
MCH: 26.7 pg (ref 26.0–34.0)
MCHC: 33.1 g/dL (ref 30.0–36.0)
MCV: 80.5 fL (ref 78.0–100.0)
MONO ABS: 0.3 10*3/uL (ref 0.1–1.0)
MONOS PCT: 7 %
NEUTROS ABS: 2.3 10*3/uL (ref 1.7–7.7)
Neutrophils Relative %: 46 %
Platelets: 267 10*3/uL (ref 150–400)
RBC: 4.2 MIL/uL (ref 3.87–5.11)
RDW: 13.8 % (ref 11.5–15.5)
WBC: 5 10*3/uL (ref 4.0–10.5)

## 2016-06-16 LAB — COMPREHENSIVE METABOLIC PANEL
ALK PHOS: 59 U/L (ref 38–126)
ALT: 16 U/L (ref 14–54)
ANION GAP: 9 (ref 5–15)
AST: 19 U/L (ref 15–41)
Albumin: 3.1 g/dL — ABNORMAL LOW (ref 3.5–5.0)
BILIRUBIN TOTAL: 0.2 mg/dL — AB (ref 0.3–1.2)
BUN: 12 mg/dL (ref 6–20)
CALCIUM: 8.7 mg/dL — AB (ref 8.9–10.3)
CO2: 24 mmol/L (ref 22–32)
Chloride: 103 mmol/L (ref 101–111)
Creatinine, Ser: 0.67 mg/dL (ref 0.44–1.00)
GFR calc Af Amer: >60 mL/min (ref >60)
GFR calc non Af Amer: >60 mL/min (ref >60)
Glucose, Bld: 260 mg/dL — ABNORMAL HIGH (ref 65–99)
Potassium: 3.4 mmol/L — ABNORMAL LOW (ref 3.5–5.1)
SODIUM: 136 mmol/L (ref 135–145)
TOTAL PROTEIN: 6.3 g/dL — AB (ref 6.5–8.1)

## 2016-06-16 LAB — I-STAT TROPONIN, ED
TROPONIN I, POC: 0 ng/mL (ref 0.00–0.08)
TROPONIN I, POC: 0 ng/mL (ref 0.00–0.08)

## 2016-06-16 LAB — BRAIN NATRIURETIC PEPTIDE: B Natriuretic Peptide: 16.6 pg/mL (ref 0.0–100.0)

## 2016-06-16 MED ORDER — IOPAMIDOL (ISOVUE-370) INJECTION 76%
INTRAVENOUS | Status: AC
  Administered 2016-06-16: 100 mL
  Filled 2016-06-16: qty 100

## 2016-06-16 MED ORDER — ASPIRIN 81 MG PO CHEW
324.0000 mg | CHEWABLE_TABLET | Freq: Once | ORAL | Status: AC
  Administered 2016-06-16: 324 mg via ORAL
  Filled 2016-06-16: qty 4

## 2016-06-16 MED ORDER — CEPHALEXIN 500 MG PO CAPS: 500.0000 mg | ORAL_CAPSULE | Freq: Four times a day (QID) | ORAL | 0 refills | Status: DC

## 2016-06-16 NOTE — ED Provider Notes (Signed)
Patient reports she started getting left-sided chest pain that she describes a sharp pain coming and going and lasting 2-3 minutes but she's had off and on for a long time. She states it mainly happens when she is stressed or upset. She states she was upset tonight about the red area on her right breast. He denies any family history of heart problem except for brother who had congestive heart failure. Currently she has no complaints of chest pain.  Patient is alert and cooperative and in no distress. She is currently pain-free.  Medical screening examination/treatment/procedure(s) were conducted as a shared visit with non-physician practitioner(s) and myself.  I personally evaluated the patient during the encounter.   EKG Interpretation  Date/Time:  Wednesday June 16 2016 01:24:08 EST Ventricular Rate:  81 PR Interval:  170 QRS Duration: 84 QT Interval:  412 QTC Calculation: 478 R Axis:   3 Text Interpretation:  Normal sinus rhythm Moderate voltage criteria for LVH, may be normal variant No significant change since last tracing 30 Apr 2015 Confirmed by [facility]  MD-I, Matthe Sloane ([id number]) on 06/16/2016 6:13:30 AM       Rolland Porter, MD, Barbette Or, MD 06/16/16 [phone]

## 2016-06-16 NOTE — Discharge Instructions (Signed)
1. Medications: usual home medications 2. Treatment: rest, drink plenty of fluids,  3. Follow Up: Please followup with your primary doctor in 2 days for discussion of your diagnoses and further evaluation after today's visit; if you do not have a primary care doctor use the resource guide provided to find one; Please return to the ER for worsening symptoms, high fevers or other concerns

## 2016-06-16 NOTE — ED Provider Notes (Signed)
[facility] DEPT Provider Note   CSN: [id number] Arrival date & time: 06/16/16  0051     History   Chief Complaint Chief Complaint  Patient presents with  . Insect Bite  . Chest Pain    HPI [name] [name] is a 55 y.o. female with a hx of anemia, HTN, IDDM, MRSA presents to the Emergency Department complaining of  Intermittent chest pain onset 10 PM last night. Patient reports the pain is a 10/10 and radiates around the left side of her chest. Around the same time she noticed a large red spot on her right breast. She killed the spider that was crawling there but does not think that it bit her. She has no pain at the site. No fever or chills. Patient also complains of bilateral leg swelling, right greater than left onset late last night as well.  Patient denies estrogen usage, long Recent Surgeries or Fractures. She Has Never Had a DVT or PE. She Is Not Currently Anticoagulated.  Patient reports she does not know what her blood pressure normally runs. She reports she's been compliant with her medications including her amlodipine.  Nothing makes the symptoms better or worse.  Pt reports tonight's pain is similar to previous episodes when she becomes upset.  She reports she was very stressed tonight about the site on her right breast.    The history is provided by the patient and medical records. No language interpreter was used.    Past Medical History:  Diagnosis Date  . Anemia   . Diabetes mellitus   . Fibroids   . Hypertension   . MRSA (methicillin resistant Staphylococcus aureus)   . Trichomonas     Patient Active Problem List   Diagnosis Date Noted  . 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 Abnormal uterine bleeding 02/07/2012  . Ovarian cyst 02/07/2012  . ACUTE CYSTITIS 06/29/2010  . BREAST PAIN, BILATERAL 02/19/2010  . HOT FLASHES 11/03/2009  . ANEMIA, IRON DEFICIENCY 10/03/2009  . LEUKOPENIA, MILD 09/19/2009  . CHEST PAIN UNSPECIFIED  09/19/2009  . 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 LIPOMA 06/18/2009  . 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 TINEA PEDIS 05/07/2009  . SKIN LESION 05/07/2009  . COUGH 05/07/2009  . VAGINITIS, BACTERIAL, RECURRENT 08/15/2007  . LEIOMYOMA, UTERUS 07/07/2007  . CANDIDIASIS OF VULVA AND VAGINA 06/20/2007  . TRICHOMONAL VAGINITIS 06/13/2007  . URI 04/04/2007  . SUBSTANCE ABUSE, MULTIPLE 02/22/2007  . HYPERTENSION 02/22/2007    Past Surgical History:  Procedure Laterality Date  . CESAREAN SECTION     breech    OB History    Gravida Para Term Preterm AB Living   1 1 1     1    SAB TAB Ectopic Multiple Live Births           1       Home Medications    Prior to Admission medications   Medication Sig Start Date End Date Taking? Authorizing Provider  ACCU-CHEK SOFTCLIX LANCETS lancets Use to test blood sugar 3 times daily as instructed. Dx code: 250.50 04/01/14  Yes Philemon Kingdom, MD  amLODipine (NORVASC) 10 MG tablet Take 10 mg by mouth daily.   Yes Historical Provider, MD  gabapentin (NEURONTIN) 300 MG capsule Take 1 tablet on day 1 at night. Take 1 tablet in the morning and 1 tablet at night on day 2. Take 1 tablet 3 times a day on day 3. May increase 1 tablet per day to a maximum of 9 tablets per day.  Patient taking differently: Take 600 mg by mouth 2 (two) times daily.  06/07/15  Yes Leonard Schwartz, MD  glucose blood (ACCU-CHEK AVIVA PLUS) test strip Use to test blood sugar 3 times daily as instructed. Dx code: E11.319 05/13/14  Yes Philemon Kingdom, MD  insulin aspart (NOVOLOG FLEXPEN) 100 UNIT/ML FlexPen Inject 14-20 Units into the skin 3 (three) times daily with meals. Use sliding scale as instructed. 11/04/14  Yes Philemon Kingdom, MD  Insulin Glargine (LANTUS SOLOSTAR) 100 UNIT/ML Solostar Pen Inject 40 Units into the skin at bedtime. Patient taking differently: Inject 24 Units into the skin at bedtime.  05/13/14  Yes Philemon Kingdom, MD  Insulin Pen Needle (BD PEN NEEDLE NANO U/F) 32G  X 4 MM MISC Use to inject insulin 4 times daily. 05/13/14  Yes Philemon Kingdom, MD  metFORMIN (GLUCOPHAGE) 1000 MG tablet Take 1,000 mg by mouth 2 (two) times daily with a meal.   Yes Historical Provider, MD  nitroGLYCERIN (NITROSTAT) 0.4 MG SL tablet Place 0.4 mg under the tongue every 5 (five) minutes as needed for chest pain.   Yes Historical Provider, MD  ondansetron (ZOFRAN ODT) 8 MG disintegrating tablet Take 1 tablet (8 mg total) by mouth every 8 (eight) hours as needed for nausea or vomiting. 09/24/15  Yes Tatyana Kirichenko, PA-C  cephALEXin (KEFLEX) 500 MG capsule Take 1 capsule (500 mg total) by mouth 4 (four) times daily. 06/16/16   Jarrett Soho Daundre Biel, PA-C    Family History Family History  Problem Relation Age of Onset  . Other Neg Hx     Social History Social History  Substance Use Topics  . Smoking status: Never Smoker  . Smokeless tobacco: Never Used  . Alcohol use No     Allergies   Patient has no known allergies.   Review of Systems Review of Systems  All other systems reviewed and are negative.    Physical Exam Updated Vital Signs BP (!) 200/116 (BP Location: Right Arm)   Pulse 116   Temp 97.7 F (36.5 C) (Oral)   Resp 20   LMP 04/01/2010   SpO2 99%   Physical Exam  Constitutional: She appears well-developed and well-nourished. No distress.  Awake, alert, nontoxic appearance  HENT:  Head: Normocephalic and atraumatic.  Mouth/Throat: Oropharynx is clear and moist. No oropharyngeal exudate.  Eyes: Conjunctivae are normal. No scleral icterus.  Neck: Normal range of motion. Neck supple.  Cardiovascular: Regular rhythm and intact distal pulses.  Tachycardia present.   Pulses:      Radial pulses are 2+ on the right side, and 2+ on the left side.       Popliteal pulses are 2+ on the right side, and 1+ on the left side.  Pulmonary/Chest: Effort normal and breath sounds normal. No respiratory distress. She has no wheezes.    Equal chest  expansion Mild dyspnea on exertion moving around in bed, standing or changing clothes Pt with 4x4cm area of erythema to the right breast.  NO increased warmth, no induration, no nipple discharge, no open wounds. Excoriations noted.    Abdominal: Soft. Bowel sounds are normal. She exhibits no mass. There is no tenderness. There is no rebound and no guarding.  Musculoskeletal: Normal range of motion. She exhibits no edema.  Neurological: She is alert.  Speech is clear and goal oriented Moves extremities without ataxia  Skin: Skin is warm and dry. She is not diaphoretic.  Psychiatric: She has a normal mood and affect.  Nursing note and vitals reviewed.  ED Treatments / Results  Labs (all labs ordered are listed, but only abnormal results are displayed) Labs Reviewed  CBC WITH DIFFERENTIAL/PLATELET - Abnormal; Notable for the following:       Result Value   Hemoglobin 11.2 (*)    HCT 33.8 (*)    All other components within normal limits  COMPREHENSIVE METABOLIC PANEL - Abnormal; Notable for the following:    Potassium 3.4 (*)    Glucose, Bld 260 (*)    Calcium 8.7 (*)    Total Protein 6.3 (*)    Albumin 3.1 (*)    Total Bilirubin 0.2 (*)    All other components within normal limits  BRAIN NATRIURETIC PEPTIDE  I-STAT TROPOININ, ED  I-STAT TROPOININ, ED  I-STAT TROPOININ, ED    EKG  EKG Interpretation  Date/Time:  Wednesday June 16 2016 01:24:08 EST Ventricular Rate:  81 PR Interval:  170 QRS Duration: 84 QT Interval:  412 QTC Calculation: 478 R Axis:   3 Text Interpretation:  Normal sinus rhythm Moderate voltage criteria for LVH, may be normal variant No significant change since last tracing 30 Apr 2015 Confirmed by KNAPP  MD-I, IVA ([id number]) on 06/16/2016 6:13:30 AM       Radiology Dg Chest 2 View  Result Date: 06/16/2016 CLINICAL DATA:  Chest pain EXAM: CHEST  2 VIEW COMPARISON:  04/07/2016 FINDINGS: The heart size and mediastinal contours are within normal  limits. Both lungs are clear. The visualized skeletal structures are unremarkable. IMPRESSION: No active cardiopulmonary disease. Electronically Signed   By: Donavan Foil M.D.   On: 06/16/2016 02:01   Ct Angio Chest Pe W Or Wo Contrast  Result Date: 06/16/2016 CLINICAL DATA:  Acute onset of generalized chest pain, shortness of breath and leg swelling. Initial encounter. EXAM: CT ANGIOGRAPHY CHEST WITH CONTRAST TECHNIQUE: Multidetector CT imaging of the chest was performed using the standard protocol during bolus administration of intravenous contrast. Multiplanar CT image reconstructions and MIPs were obtained to evaluate the vascular anatomy. CONTRAST:  100 mL of Isovue 370 IV contrast COMPARISON:  Chest radiograph performed earlier today at 1:19 a.m. FINDINGS: Cardiovascular:  There is no evidence of pulmonary embolus. The heart is unremarkable in appearance. No calcific atherosclerotic disease is seen. The great vessels are unremarkable. Mediastinum/Nodes: The mediastinum is unremarkable appearance. No mediastinal lymphadenopathy is seen. No pericardial effusion is identified. The visualized portions of the thyroid gland are unremarkable. No axillary lymphadenopathy is appreciated. Lungs/Pleura: Mild bilateral atelectasis is noted. No pleural effusion or pneumothorax is seen. No masses are identified. The lung bases are incompletely characterized on this study. Upper Abdomen: The visualized portions of the liver and spleen are grossly unremarkable. Musculoskeletal: No acute osseous abnormalities are identified. The visualized musculature is unremarkable in appearance. Review of the MIP images confirms the above findings. IMPRESSION: 1. No evidence of pulmonary embolus. 2. Mild bilateral atelectasis noted.  Lungs otherwise grossly clear. Electronically Signed   By: Garald Balding M.D.   On: 06/16/2016 04:06    Procedures Procedures (including critical care time)  Medications Ordered in ED Medications   aspirin chewable tablet 324 mg (324 mg Oral Given 06/16/16 0302)  iopamidol (ISOVUE-370) 76 % injection (100 mLs  Contrast Given 06/16/16 0327)     Initial Impression / Assessment and Plan / ED Course  I have reviewed the triage vital signs and the nursing notes.  Pertinent labs & imaging results that were available during my care of the patient were reviewed by me and considered in  my medical decision making (see chart for details).  Clinical Course as of Jun 16 626  Wed Jun 16, 2016  0234 HTN noted BP: (!) 200/116 [HM]  0234 tachycardia Pulse Rate: 116 [HM]  0234 afebrile Temp: 97.7 F (36.5 C) [HM]  0247 Mild anemia Hemoglobin: (!) 11.2 [HM]  0248 Mild hypokalemia Potassium: (!) 3.4 [HM]  0248 No pulmonary edema or pneumonia DG Chest 2 View [HM]  0248 NSR ED EKG [HM]  0459 Pt ambulated self efficiently within hallway. Pt O2 sat remained at 96% while sitting at bedside and ambulating in hallway on room air.  [HM]    Clinical Course User Index [HM] Jarrett Soho Jacquelene Kopecky, PA-C    Pt with chest pain after Becoming upset night.  Patient reports pain is similar to previous episodes. Labs are reassuring. Initial and a delta troponin negative. BNP is negative. Chest x-ray is negative.  Anemia his baseline. Patient noted to be hypertensive. This decreases significantly when patient is able to calm down however blood pressure rises when she becomes upset.    Right breast with some erythema and small excoriations. Not overtly infected. No evidence of abscess. Will start on Keflex for mild cellulitis.  7:10 AM Patient is chest pain-free at this time. Her vital signs have improved. She is well appearing and wishes for discharge home. No evidence of ACS. CT scan without PE or dissection. Patient is to follow-up with primary care physician for further evaluation and treatment. Discussed reasons to return to the emergency department.  BP (!) 168/117   Pulse 79   Temp 97.7 F (36.5 C) (Oral)    Resp 18   LMP 04/01/2010   SpO2 98%   Final Clinical Impressions(s) / ED Diagnoses   Final diagnoses:  Chest pain, unspecified type  Hypertension, unspecified type    New Prescriptions New Prescriptions   CEPHALEXIN (KEFLEX) 500 MG CAPSULE    Take 1 capsule (500 mg total) by mouth 4 (four) times daily.     Jarrett Soho Annamay Laymon, PA-C 06/16/16 0710    Rolland Porter, MD 06/16/16 [phone]

## 2016-06-16 NOTE — ED Notes (Signed)
Pt taken to CT.

## 2016-06-16 NOTE — ED Notes (Signed)
Pt ambulated self efficiently within hallway. Pt O2 sat remained at 96% while sitting at bedside and ambulating in hallway on room air.

## 2016-06-16 NOTE — ED Triage Notes (Addendum)
Patient here for evaluation of a spider bite to her right breast, patient states she was able to visualize the spider and killed it.  Redness noted to left breast and patient endorses swelling to her ankles and feet.  Painfull upon palpation.  Patient A&Ox4 at this time. HTN noted 200/116 tachy at 116.  Hx of CHF and endorses chest tightness

## 2016-06-16 NOTE — ED Notes (Signed)
Patient Alert and oriented X4. Stable and ambulatory. Patient verbalized understanding of the discharge instructions.  Patient belongings were taken by the patient.  

## 2016-06-16 NOTE — ED Notes (Signed)
Pt sats noted to be 79% with good waveform. Pt is awake, c/o mild shortness of breath, and is already on oxygen at 2L. NAD noted

## 2016-06-16 NOTE — ED Notes (Signed)
Pt's oxygen rechecked with portable monitor, as pt is in no distress. Oxygen sats 98%

## 2016-06-25 ENCOUNTER — Encounter: Payer: Self-pay | Admitting: Podiatry

## 2016-06-25 ENCOUNTER — Ambulatory Visit (INDEPENDENT_AMBULATORY_CARE_PROVIDER_SITE_OTHER): Payer: Medicaid Other | Admitting: Podiatry

## 2016-06-25 VITALS — Ht 65.0 in | Wt 206.0 lb

## 2016-06-25 DIAGNOSIS — M722 Plantar fascial fibromatosis: Secondary | ICD-10-CM | POA: Diagnosis not present

## 2016-06-25 DIAGNOSIS — S93609A Unspecified sprain of unspecified foot, initial encounter: Secondary | ICD-10-CM | POA: Diagnosis not present

## 2016-06-25 MED ORDER — MELOXICAM 15 MG PO TABS: 15.0000 mg | ORAL_TABLET | Freq: Every day | ORAL | 0 refills | Status: DC

## 2016-06-25 NOTE — Progress Notes (Signed)
This patient presents the office with chief complaint of painful feet. She states that the feet are extremely painful as she walks during the course of the day. She says the pain has been severe, making her life very difficult. She was seen initially by myself in December 2016 and she was experiencing the same problem. I had recommended that she be reevaluated by her medical doctor who treated her neuropathy with gabapentin. She says the gabapentin puts her to sleep more than it helps with her foot pain. She says she has pain that courses through the bottom of both feet as she walks and ambulates.  I attempted to treat her last year with  Mobic which she was unsure if it was helpful. She was also treated with an elastic sock which she said was ineffective. She now returns to the office approximately 1 year later with the same problem on both feet. She presents the office today for continued evaluation and treatment of her painful feet   GENERAL APPEARANCE: Alert, conversant. Appropriately groomed. No acute distress.  VASCULAR: Pedal pulses are  palpable at  [facility] and PT bilateral.  Capillary refill time is immediate to all digits,  Normal temperature gradient.  Digital hair growth is present bilateral  NEUROLOGIC: sensation is normal to 5.07 monofilament at 5/5 sites bilateral.  Light touch is intact bilateral, Muscle strength normal.  MUSCULOSKELETAL: acceptable muscle strength, tone and stability bilateral.  Intrinsic muscluature intact bilateral.  Rectus appearance of foot and digits noted bilateral. Evaluation of her feet reveal increased temperature and swelling both feet. She is also noted as having swelling and pain and increased temperature both lower legs.    DERMATOLOGIC: skin color, texture, and turgor are within normal limits.  No preulcerative lesions or ulcers  are seen, no interdigital maceration noted.  No open lesions present.  Digital nails are asymptomatic. No drainage noted.   Foot sprain   B/L  Plantar fascitis  ROV  her x-rays from her last visit were brought up and appeared to be osteopenic. There appeared to be no bony pathology noted from those old x-rays patient was treated with an LA pad both feet.  Mobic was prescribed and patient was instructed to take one a day.  She was also instructed to make an appointment with one of the medical podiatrists for an evaluation of her whole lower extremity bilateral   Gardiner Barefoot DPM

## 2016-06-30 ENCOUNTER — Encounter: Payer: Self-pay | Admitting: Podiatry

## 2016-06-30 ENCOUNTER — Ambulatory Visit (INDEPENDENT_AMBULATORY_CARE_PROVIDER_SITE_OTHER): Payer: Medicaid Other | Admitting: Podiatry

## 2016-06-30 ENCOUNTER — Ambulatory Visit (INDEPENDENT_AMBULATORY_CARE_PROVIDER_SITE_OTHER): Payer: Medicaid Other

## 2016-06-30 VITALS — HR 99

## 2016-06-30 DIAGNOSIS — M722 Plantar fascial fibromatosis: Secondary | ICD-10-CM

## 2016-06-30 DIAGNOSIS — M79671 Pain in right foot: Secondary | ICD-10-CM

## 2016-06-30 DIAGNOSIS — M79672 Pain in left foot: Principal | ICD-10-CM

## 2016-06-30 MED ORDER — TRIAMCINOLONE ACETONIDE 10 MG/ML IJ SUSP
10.0000 mg | Freq: Once | INTRAMUSCULAR | Status: AC
  Administered 2016-06-30: 10 mg

## 2016-06-30 NOTE — Progress Notes (Signed)
Subjective:     Patient ID: [name] [name], female   DOB: 06/15/1961, 55 y.o.   MRN: [id number]  HPI patient presents with several different problems with one being abnormal arch height and the other being chronic pain in the arch and forefoot with the pain in the arch being worse   Review of Systems     Objective:   Physical Exam Neurovascular status intact muscle strength adequate and patient found to have discomfort in the plantar arch bilateral in the more proximal portion with moderately high arch and also inflammatory capsulitis noted    Assessment:     Fasciitis-like symptomatology with no other significant pathology    Plan:     H&P condition reviewed and careful injection administered bilateral with instructions for heat ice therapy and protection with Ace wrap. Patient will do stretching exercises were supportive shoes and will be seen back as needed  X-ray report indicates that there is minimal spurs but there is valgus foot type with no indications of fracture

## 2016-07-19 HISTORY — PX: BREAST BIOPSY: SHX20

## 2016-07-29 ENCOUNTER — Encounter: Payer: Self-pay | Admitting: Podiatry

## 2016-07-29 NOTE — Progress Notes (Signed)
Patient picked up copies of requested Medical Records.

## 2016-08-31 ENCOUNTER — Emergency Department (HOSPITAL_COMMUNITY): Payer: Medicaid Other

## 2016-08-31 ENCOUNTER — Encounter (HOSPITAL_COMMUNITY): Payer: Self-pay | Admitting: Emergency Medicine

## 2016-08-31 ENCOUNTER — Emergency Department (HOSPITAL_COMMUNITY)
Admission: EM | Admit: 2016-08-31 | Discharge: 2016-08-31 | Disposition: A | Discharge status: Home / Self Care | Payer: Medicaid Other | Attending: Emergency Medicine | Admitting: Emergency Medicine

## 2016-08-31 DIAGNOSIS — R0602 Shortness of breath: Secondary | ICD-10-CM | POA: Diagnosis not present

## 2016-08-31 DIAGNOSIS — E119 Type 2 diabetes mellitus without complications: Secondary | ICD-10-CM | POA: Diagnosis not present

## 2016-08-31 DIAGNOSIS — Z794 Long term (current) use of insulin: Secondary | ICD-10-CM | POA: Insufficient documentation

## 2016-08-31 DIAGNOSIS — R079 Chest pain, unspecified: Secondary | ICD-10-CM | POA: Diagnosis not present

## 2016-08-31 DIAGNOSIS — I10 Essential (primary) hypertension: Secondary | ICD-10-CM | POA: Diagnosis not present

## 2016-08-31 DIAGNOSIS — Z79899 Other long term (current) drug therapy: Secondary | ICD-10-CM | POA: Insufficient documentation

## 2016-08-31 LAB — CBC
HCT: 35.3 % — ABNORMAL LOW (ref 36.0–46.0)
HEMOGLOBIN: 11.3 g/dL — AB (ref 12.0–15.0)
MCH: 26.7 pg (ref 26.0–34.0)
MCHC: 32 g/dL (ref 30.0–36.0)
MCV: 83.3 fL (ref 78.0–100.0)
PLATELETS: 217 10*3/uL (ref 150–400)
RBC: 4.24 MIL/uL (ref 3.87–5.11)
RDW: 14.1 % (ref 11.5–15.5)
WBC: 4.8 10*3/uL (ref 4.0–10.5)

## 2016-08-31 LAB — BASIC METABOLIC PANEL
ANION GAP: 11 (ref 5–15)
BUN: 12 mg/dL (ref 6–20)
CALCIUM: 8.5 mg/dL — AB (ref 8.9–10.3)
CO2: 21 mmol/L — ABNORMAL LOW (ref 22–32)
CREATININE: 0.59 mg/dL (ref 0.44–1.00)
Chloride: 107 mmol/L (ref 101–111)
GLUCOSE: 230 mg/dL — AB (ref 65–99)
Potassium: 4.2 mmol/L (ref 3.5–5.1)
Sodium: 139 mmol/L (ref 135–145)

## 2016-08-31 LAB — I-STAT TROPONIN, ED
TROPONIN I, POC: 0 ng/mL (ref 0.00–0.08)
Troponin i, poc: 0 ng/mL (ref 0.00–0.08)

## 2016-08-31 LAB — BRAIN NATRIURETIC PEPTIDE: B Natriuretic Peptide: 20.7 pg/mL (ref 0.0–100.0)

## 2016-08-31 MED ORDER — NITROGLYCERIN 0.4 MG SL SUBL
0.4000 mg | SUBLINGUAL_TABLET | SUBLINGUAL | Status: DC | PRN
  Filled 2016-08-31: qty 1

## 2016-08-31 MED ORDER — IBUPROFEN 600 MG PO TABS: 600.0000 mg | ORAL_TABLET | Freq: Four times a day (QID) | ORAL | 0 refills | Status: DC | PRN

## 2016-08-31 MED ORDER — ASPIRIN 81 MG PO CHEW
324.0000 mg | CHEWABLE_TABLET | Freq: Once | ORAL | Status: AC
  Administered 2016-08-31: 324 mg via ORAL
  Filled 2016-08-31: qty 4

## 2016-08-31 MED ORDER — LOPERAMIDE HCL 2 MG PO CAPS: 2.0000 mg | ORAL_CAPSULE | Freq: Four times a day (QID) | ORAL | 0 refills | Status: DC | PRN

## 2016-08-31 NOTE — ED Triage Notes (Addendum)
Pt c/o left sided chest pain onset yesterday with diaphoresis. Pt reports having diarrhea with the chest pain. Pt also reports vaginal pain with intercourse.

## 2016-08-31 NOTE — ED Provider Notes (Signed)
[facility] DEPT Provider Note   CSN: [id number] Arrival date & time: 08/31/16  0530     History   Chief Complaint Chief Complaint  Patient presents with  . Chest Pain    HPI [name] [name] is a 56 y.o. female.  Patient presents to the ED with a chief complaint of chest pain.  She states that the onset was 3 days ago. She reports dyspnea on exertion, especially with climbing stairs.  She states that she felt diaphoretic today.  She has not tried taking anything for her symptoms. She states the pain is moderate. Additionally, patient complains of some dyspareunia, but denies any abdominal pain or discharge.  She has had this complaint for the past month.   The history is provided by the patient. No language interpreter was used.    Past Medical History:  Diagnosis Date  . Anemia   . Diabetes mellitus   . Fibroids   . Hypertension   . MRSA (methicillin resistant Staphylococcus aureus)   . Trichomonas     Patient Active Problem List   Diagnosis Date Noted  . 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 Abnormal uterine bleeding 02/07/2012  . Ovarian cyst 02/07/2012  . ACUTE CYSTITIS 06/29/2010  . BREAST PAIN, BILATERAL 02/19/2010  . HOT FLASHES 11/03/2009  . ANEMIA, IRON DEFICIENCY 10/03/2009  . LEUKOPENIA, MILD 09/19/2009  . CHEST PAIN UNSPECIFIED 09/19/2009  . 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 LIPOMA 06/18/2009  . 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 TINEA PEDIS 05/07/2009  . SKIN LESION 05/07/2009  . COUGH 05/07/2009  . VAGINITIS, BACTERIAL, RECURRENT 08/15/2007  . LEIOMYOMA, UTERUS 07/07/2007  . CANDIDIASIS OF VULVA AND VAGINA 06/20/2007  . TRICHOMONAL VAGINITIS 06/13/2007  . URI 04/04/2007  . SUBSTANCE ABUSE, MULTIPLE 02/22/2007  . HYPERTENSION 02/22/2007    Past Surgical History:  Procedure Laterality Date  . CESAREAN SECTION     breech    OB History    Gravida Para Term Preterm AB Living   1 1 1     1    SAB TAB Ectopic Multiple Live  Births           1       Home Medications    Prior to Admission medications   Medication Sig Start Date End Date Taking? Authorizing Provider  albuterol (PROVENTIL HFA;VENTOLIN HFA) 108 (90 Base) MCG/ACT inhaler Inhale 2 puffs into the lungs every 4 (four) hours as needed for wheezing or shortness of breath.   Yes Historical Provider, MD  amLODipine (NORVASC) 10 MG tablet Take 10 mg by mouth daily.   Yes Historical Provider, MD  gabapentin (NEURONTIN) 300 MG capsule Take 1 tablet on day 1 at night. Take 1 tablet in the morning and 1 tablet at night on day 2. Take 1 tablet 3 times a day on day 3. May increase 1 tablet per day to a maximum of 9 tablets per day. Patient taking differently: Take 300 mg by mouth 3 (three) times daily.  06/07/15  Yes Leonard Schwartz, MD  insulin aspart (NOVOLOG FLEXPEN) 100 UNIT/ML FlexPen Inject 14-20 Units into the skin 3 (three) times daily with meals. Use sliding scale as instructed. Patient taking differently: Inject 10 Units into the skin 3 (three) times daily as needed for high blood sugar. Use sliding scale as instructed. 11/04/14  Yes Philemon Kingdom, MD  Insulin Glargine (LANTUS SOLOSTAR) 100 UNIT/ML Solostar Pen Inject 40 Units into the skin at bedtime. Patient taking differently: Inject 24 Units into the skin at  bedtime.  05/13/14  Yes Philemon Kingdom, MD  metFORMIN (GLUCOPHAGE) 1000 MG tablet Take 1,000 mg by mouth 2 (two) times daily with a meal.   Yes Historical Provider, MD  nitroGLYCERIN (NITROSTAT) 0.4 MG SL tablet Place 0.4 mg under the tongue every 5 (five) minutes as needed for chest pain.   Yes Historical Provider, MD  pantoprazole (PROTONIX) 40 MG tablet Take 40 mg by mouth daily.   Yes Historical Provider, MD  pravastatin (PRAVACHOL) 20 MG tablet Take 20 mg by mouth at bedtime.   Yes Historical Provider, MD  ACCU-CHEK SOFTCLIX LANCETS lancets Use to test blood sugar 3 times daily as instructed. Dx code: 20.50 Patient not taking: Reported  on 08/31/2016 04/01/14   Philemon Kingdom, MD  cephALEXin (KEFLEX) 500 MG capsule Take 1 capsule (500 mg total) by mouth 4 (four) times daily. Patient not taking: Reported on 08/31/2016 06/16/16   Jarrett Soho Muthersbaugh, PA-C  glucose blood (ACCU-CHEK AVIVA PLUS) test strip Use to test blood sugar 3 times daily as instructed. Dx code: E11.319 Patient not taking: Reported on 08/31/2016 05/13/14   Philemon Kingdom, MD  Insulin Pen Needle (BD PEN NEEDLE NANO U/F) 32G X 4 MM MISC Use to inject insulin 4 times daily. Patient not taking: Reported on 08/31/2016 05/13/14   Philemon Kingdom, MD  meloxicam (MOBIC) 15 MG tablet Take 1 tablet (15 mg total) by mouth daily. Patient not taking: Reported on 08/31/2016 06/25/16   Gardiner Barefoot, DPM  ondansetron (ZOFRAN ODT) 8 MG disintegrating tablet Take 1 tablet (8 mg total) by mouth every 8 (eight) hours as needed for nausea or vomiting. Patient not taking: Reported on 08/31/2016 09/24/15   Jeannett Senior, PA-C    Family History Family History  Problem Relation Age of Onset  . Other Neg Hx     Social History Social History  Substance Use Topics  . Smoking status: Never Smoker  . Smokeless tobacco: Never Used  . Alcohol use No     Allergies   Patient has no known allergies.   Review of Systems Review of Systems  Constitutional: Positive for diaphoresis.  Respiratory: Positive for shortness of breath.   Cardiovascular: Positive for chest pain.  All other systems reviewed and are negative.    Physical Exam Updated Vital Signs BP 157/93   Pulse 89   Temp 97.6 F (36.4 C) (Oral)   Resp 18   Ht 5\' 6"  (1.676 m)   Wt 99.8 kg   LMP 04/01/2010   SpO2 97%   BMI 35.51 kg/m   Physical Exam  Constitutional: She is oriented to person, place, and time. She appears well-developed and well-nourished.  HENT:  Head: Normocephalic and atraumatic.  Eyes: Conjunctivae and EOM are normal. Pupils are equal, round, and reactive to light.  Neck: Normal  range of motion. Neck supple.  Cardiovascular: Normal rate and regular rhythm.  Exam reveals no gallop and no friction rub.   No murmur heard. Pulmonary/Chest: Effort normal and breath sounds normal. No respiratory distress. She has no wheezes. She has no rales. She exhibits no tenderness.  Abdominal: Soft. Bowel sounds are normal. She exhibits no distension and no mass. There is no tenderness. There is no rebound and no guarding.  Musculoskeletal: Normal range of motion. She exhibits no edema or tenderness.  Neurological: She is alert and oriented to person, place, and time.  Skin: Skin is warm and dry.  Psychiatric: She has a normal mood and affect. Her behavior is normal. Judgment and thought content  normal.  Nursing note and vitals reviewed.    ED Treatments / Results  Labs (all labs ordered are listed, but only abnormal results are displayed) Labs Reviewed  BASIC METABOLIC PANEL - Abnormal; Notable for the following:       Result Value   CO2 21 (*)    Glucose, Bld 230 (*)    Calcium 8.5 (*)    All other components within normal limits  CBC - Abnormal; Notable for the following:    Hemoglobin 11.3 (*)    HCT 35.3 (*)    All other components within normal limits  BRAIN NATRIURETIC PEPTIDE  I-STAT TROPOININ, ED    EKG  EKG Interpretation None       Radiology Dg Chest 2 View  Result Date: 08/31/2016 CLINICAL DATA:  Left-sided chest pain tonight. EXAM: CHEST  2 VIEW COMPARISON:  06/16/2016 FINDINGS: Shallow inspiration. Heart size and pulmonary vascularity are normal. Lungs are clear. No blunting of costophrenic angles. No pneumothorax. Tortuous aorta. IMPRESSION: Shallow inspiration.  No evidence of active pulmonary disease. Electronically Signed   By: Lucienne Capers M.D.   On: 08/31/2016 06:09    Procedures Procedures (including critical care time)  Medications Ordered in ED Medications  nitroGLYCERIN (NITROSTAT) SL tablet 0.4 mg (not administered)  aspirin  chewable tablet 324 mg (324 mg Oral Given 08/31/16 0737)     Initial Impression / Assessment and Plan / ED Course  I have reviewed the triage vital signs and the nursing notes.  Pertinent labs & imaging results that were available during my care of the patient were reviewed by me and considered in my medical decision making (see chart for details).     Patient with CP x 2-3 days.  Exertional SOB.  Will check labs and EKG.  Laboratory workup is reassuring. BNP is normal. Delta troponin is 0.00. Chest x-ray is normal. Recommend the patient follow-up with her primary care provider or with cardiology for her chest pain was such a been ongoing for several days. I have a low suspicion for ACS. I have also encouraged patient to follow-up with gynecology for dyspareunia for several months. At discharge, patient also states that she has had several episodes of diarrhea. Her abdominal exam is benign. Laboratory workup is reassuring. Will discharge with Imodium and primary care follow-up.  Final Clinical Impressions(s) / ED Diagnoses   Final diagnoses:  Nonspecific chest pain    New Prescriptions New Prescriptions   LOPERAMIDE (IMODIUM) 2 MG CAPSULE    Take 1 capsule (2 mg total) by mouth 4 (four) times daily as needed for diarrhea or loose stools.     Montine Circle, PA-C 08/31/16 [place], MD 09/01/16 [phone]

## 2016-08-31 NOTE — ED Notes (Signed)
Contacted lab to add on BNP. 

## 2016-09-27 DIAGNOSIS — R52 Pain, unspecified: Secondary | ICD-10-CM

## 2016-10-07 ENCOUNTER — Encounter: Payer: Self-pay | Admitting: Family Medicine

## 2016-10-07 ENCOUNTER — Ambulatory Visit: Payer: Medicaid Other | Attending: Family Medicine | Admitting: Family Medicine

## 2016-10-07 VITALS — BP 146/86 | HR 99 | Temp 97.5°F | Ht 66.0 in | Wt 216.0 lb

## 2016-10-07 DIAGNOSIS — Z794 Long term (current) use of insulin: Secondary | ICD-10-CM | POA: Diagnosis not present

## 2016-10-07 DIAGNOSIS — Z79899 Other long term (current) drug therapy: Secondary | ICD-10-CM | POA: Diagnosis not present

## 2016-10-07 DIAGNOSIS — N941 Unspecified dyspareunia: Secondary | ICD-10-CM | POA: Diagnosis not present

## 2016-10-07 DIAGNOSIS — E1149 Type 2 diabetes mellitus with other diabetic neurological complication: Secondary | ICD-10-CM | POA: Diagnosis not present

## 2016-10-07 DIAGNOSIS — E1140 Type 2 diabetes mellitus with diabetic neuropathy, unspecified: Secondary | ICD-10-CM | POA: Insufficient documentation

## 2016-10-07 DIAGNOSIS — E669 Obesity, unspecified: Secondary | ICD-10-CM | POA: Insufficient documentation

## 2016-10-07 DIAGNOSIS — I10 Essential (primary) hypertension: Secondary | ICD-10-CM | POA: Diagnosis not present

## 2016-10-07 DIAGNOSIS — Z6834 Body mass index (BMI) 34.0-34.9, adult: Secondary | ICD-10-CM | POA: Diagnosis not present

## 2016-10-07 DIAGNOSIS — E785 Hyperlipidemia, unspecified: Secondary | ICD-10-CM | POA: Insufficient documentation

## 2016-10-07 DIAGNOSIS — L304 Erythema intertrigo: Secondary | ICD-10-CM

## 2016-10-07 DIAGNOSIS — E7800 Pure hypercholesterolemia, unspecified: Secondary | ICD-10-CM | POA: Diagnosis not present

## 2016-10-07 LAB — POCT GLYCOSYLATED HEMOGLOBIN (HGB A1C): Hemoglobin A1C: 12.9

## 2016-10-07 LAB — GLUCOSE, POCT (MANUAL RESULT ENTRY)
POC GLUCOSE: 447 mg/dL — AB (ref 70–99)
POC Glucose: 388 mg/dl — AB (ref 70–99)

## 2016-10-07 MED ORDER — ATORVASTATIN CALCIUM 20 MG PO TABS: 20.0000 mg | ORAL_TABLET | Freq: Every day | ORAL | 3 refills | Status: DC

## 2016-10-07 MED ORDER — AMLODIPINE BESYLATE 10 MG PO TABS: 10.0000 mg | ORAL_TABLET | Freq: Every day | ORAL | 3 refills | Status: DC

## 2016-10-07 MED ORDER — ALBUTEROL SULFATE HFA 108 (90 BASE) MCG/ACT IN AERS: 2.0000 | INHALATION_SPRAY | Freq: Four times a day (QID) | RESPIRATORY_TRACT | 3 refills | Status: DC | PRN

## 2016-10-07 MED ORDER — INSULIN ASPART 100 UNIT/ML FLEXPEN: PEN_INJECTOR | SUBCUTANEOUS | 3 refills | Status: DC

## 2016-10-07 MED ORDER — ACCU-CHEK SOFTCLIX LANCETS MISC: 11 refills | Status: DC

## 2016-10-07 MED ORDER — ACCU-CHEK AVIVA DEVI: 0 refills | Status: DC

## 2016-10-07 MED ORDER — INSULIN ASPART 100 UNIT/ML ~~LOC~~ SOLN
10.0000 [IU] | Freq: Once | SUBCUTANEOUS | Status: AC
  Administered 2016-10-07: 10 [IU] via SUBCUTANEOUS

## 2016-10-07 MED ORDER — GABAPENTIN 300 MG PO CAPS: 300.0000 mg | ORAL_CAPSULE | Freq: Three times a day (TID) | ORAL | 3 refills | Status: DC

## 2016-10-07 MED ORDER — CLOTRIMAZOLE 1 % EX CREA
1.0000 | TOPICAL_CREAM | Freq: Two times a day (BID) | CUTANEOUS | 0 refills | Status: DC
Start: 2016-10-07 — End: 2016-11-10

## 2016-10-07 MED ORDER — INSULIN GLARGINE 100 UNIT/ML SOLOSTAR PEN: 35.0000 [IU] | PEN_INJECTOR | Freq: Every day | SUBCUTANEOUS | 3 refills | Status: DC

## 2016-10-07 MED ORDER — LISINOPRIL 5 MG PO TABS: 5.0000 mg | ORAL_TABLET | Freq: Every day | ORAL | 3 refills | Status: DC

## 2016-10-07 MED ORDER — GLUCOSE BLOOD VI STRP: ORAL_STRIP | 11 refills | Status: DC

## 2016-10-07 MED ORDER — METFORMIN HCL 1000 MG PO TABS: 1000.0000 mg | ORAL_TABLET | Freq: Two times a day (BID) | ORAL | 3 refills | Status: DC

## 2016-10-07 NOTE — Progress Notes (Signed)
Medication refills Needs diabetic testing supplies  Patient had a donut ([employer]) and doritos for breakfast this morning

## 2016-10-07 NOTE — Progress Notes (Signed)
Subjective:  Patient ID: [name] [name], female    DOB: 10/03/1960  Age: 56 y.o. MRN: [id number]  CC: Diabetes; Hypertension; Rash (under left breast); Nasal Congestion; and Dyspareunia   HPI [name] [name] is a 56 year old female with a history of type 2 diabetes mellitus (A1c 12.9), diabetic neuropathy, hypertension who presents to establish care.  She has been noncompliant with her medications and has been taking 24 units of Lantus rather than 40 units which was prescribed and sometimes forgets to take her NovoLog. She has not been checking her blood sugars. Denies hypoglycemia but does have numbness in extremities for which she takes gabapentin.  She complains of a pruritic painful rash beneath her left breast for the last 1 week and would like to have a treatment for that.  Also complains of dyspareunia for the last 1 year and has tried OTC vaginal lubricants with no relief in symptoms. She denies problem with sexual desire.  Outpatient Medications Prior to Visit  Medication Sig Dispense Refill  . insulin aspart (NOVOLOG FLEXPEN) 100 UNIT/ML FlexPen Inject 14-20 Units into the skin 3 (three) times daily with meals. Use sliding scale as instructed. (Patient taking differently: Inject 10 Units into the skin 3 (three) times daily as needed for high blood sugar. Use sliding scale as instructed.) 15 mL 2  . Insulin Pen Needle (BD PEN NEEDLE NANO U/F) 32G X 4 MM MISC Use to inject insulin 4 times daily. 150 each 11  . nitroGLYCERIN (NITROSTAT) 0.4 MG SL tablet Place 0.4 mg under the tongue every 5 (five) minutes as needed for chest pain.    Marland Kitchen amLODipine (NORVASC) 10 MG tablet Take 10 mg by mouth daily.    Marland Kitchen gabapentin (NEURONTIN) 300 MG capsule Take 1 tablet on day 1 at night. Take 1 tablet in the morning and 1 tablet at night on day 2. Take 1 tablet 3 times a day on day 3. May increase 1 tablet per day to a maximum of 9 tablets per day. (Patient taking differently: Take 300 mg by mouth 3  (three) times daily. ) 100 capsule 0  . glucose blood (ACCU-CHEK AVIVA PLUS) test strip Use to test blood sugar 3 times daily as instructed. Dx code: E11.319 100 each 11  . Insulin Glargine (LANTUS SOLOSTAR) 100 UNIT/ML Solostar Pen Inject 40 Units into the skin at bedtime. (Patient taking differently: Inject 24 Units into the skin at bedtime. ) 5 pen 2  . loperamide (IMODIUM) 2 MG capsule Take 1 capsule (2 mg total) by mouth 4 (four) times daily as needed for diarrhea or loose stools. 12 capsule 0  . meloxicam (MOBIC) 15 MG tablet Take 1 tablet (15 mg total) by mouth daily. 30 tablet 0  . metFORMIN (GLUCOPHAGE) 1000 MG tablet Take 1,000 mg by mouth 2 (two) times daily with a meal.    . albuterol (PROVENTIL HFA;VENTOLIN HFA) 108 (90 Base) MCG/ACT inhaler Inhale 2 puffs into the lungs every 4 (four) hours as needed for wheezing or shortness of breath.    Marland Kitchen ACCU-CHEK SOFTCLIX LANCETS lancets Use to test blood sugar 3 times daily as instructed. Dx code: 74.50 (Patient not taking: Reported on 08/31/2016) 100 each 11  . cephALEXin (KEFLEX) 500 MG capsule Take 1 capsule (500 mg total) by mouth 4 (four) times daily. (Patient not taking: Reported on 08/31/2016) 40 capsule 0  . ondansetron (ZOFRAN ODT) 8 MG disintegrating tablet Take 1 tablet (8 mg total) by mouth every 8 (eight) hours as needed for nausea  or vomiting. (Patient not taking: Reported on 08/31/2016) 10 tablet 0  . pantoprazole (PROTONIX) 40 MG tablet Take 40 mg by mouth daily.    . pravastatin (PRAVACHOL) 20 MG tablet Take 20 mg by mouth at bedtime.     No facility-administered medications prior to visit.     ROS Review of Systems  Constitutional: Negative for activity change, appetite change and fatigue.  HENT: Negative for congestion, sinus pressure and sore throat.   Eyes: Negative for visual disturbance.  Respiratory: Negative for cough, chest tightness, shortness of breath and wheezing.   Cardiovascular: Negative for chest pain and  palpitations.  Gastrointestinal: Negative for abdominal distention, abdominal pain and constipation.  Endocrine: Negative for polydipsia.  Genitourinary: Positive for vaginal pain. Negative for dysuria and frequency.  Musculoskeletal: Negative for arthralgias and back pain.  Skin: Positive for rash.  Neurological: Positive for numbness. Negative for tremors and light-headedness.  Hematological: Does not bruise/bleed easily.  Psychiatric/Behavioral: Negative for agitation and behavioral problems.    Objective:  BP (!) 146/86 (BP Location: Right Arm, Patient Position: Sitting, Cuff Size: Small)   Pulse 99   Temp 97.5 F (36.4 C) (Oral)   Ht 5\' 6"  (1.676 m)   Wt 216 lb (98 kg)   LMP 04/01/2010   SpO2 95%   BMI 34.86 kg/m   BP/Weight 10/07/2016 08/31/2016 66/10/4032  Systolic BP 742 595 -  Diastolic BP 86 98 -  Wt. (Lbs) 216 220 206  BMI 34.86 35.51 34.28      Physical Exam  Constitutional: She is oriented to person, place, and time. She appears well-developed and well-nourished.  Obese  Cardiovascular: Normal rate, normal heart sounds and intact distal pulses.   No murmur heard. Pulmonary/Chest: Effort normal and breath sounds normal. She has no wheezes. She has no rales. She exhibits no tenderness.  Abdominal: Soft. Bowel sounds are normal. She exhibits no distension and no mass. There is no tenderness.  Musculoskeletal: Normal range of motion.  Neurological: She is alert and oriented to person, place, and time.  Skin:  Erythematous rash beneath the left breast  Psychiatric: She has a normal mood and affect.    Lab Results  Component Value Date   HGBA1C 12.9 10/07/2016    CMP Latest Ref Rng & Units 08/31/2016 06/16/2016 04/07/2016  Glucose 65 - 99 mg/dL 230(H) 260(H) 326(H)  BUN 6 - 20 mg/dL 12 12 10   Creatinine 0.44 - 1.00 mg/dL 0.59 0.67 0.68  Sodium 135 - 145 mmol/L 139 136 138  Potassium 3.5 - 5.1 mmol/L 4.2 3.4(L) 4.3  Chloride 101 - 111 mmol/L 107 103 100(L)    CO2 22 - 32 mmol/L 21(L) 24 30  Calcium 8.9 - 10.3 mg/dL 8.5(L) 8.7(L) 9.1  Total Protein 6.5 - 8.1 g/dL - 6.3(L) -  Total Bilirubin 0.3 - 1.2 mg/dL - 0.2(L) -  Alkaline Phos 38 - 126 U/L - 59 -  AST 15 - 41 U/L - 19 -  ALT 14 - 54 U/L - 16 -    Assessment & Plan:   1. Type 2 diabetes mellitus with other neurologic complication, with long-term current use of insulin (HCC) Controlled with A1c of 12.9 Increase Lantus to 35 units at bedtime NovoLog sliding scale-patient provided with a copy of the scale of 0-12 units 3 times daily before meals Emphasized diabetic diet We'll review blood sugar log next visit Diabetic health care maintenance at next visit - Glucose (CBG) - HgB A1c - gabapentin (NEURONTIN) 300 MG capsule;  Take 1 capsule (300 mg total) by mouth 3 (three) times daily.  Dispense: 90 capsule; Refill: 3 - Insulin Glargine (LANTUS SOLOSTAR) 100 UNIT/ML Solostar Pen; Inject 35 Units into the skin at bedtime.  Dispense: 5 pen; Refill: 3 - metFORMIN (GLUCOPHAGE) 1000 MG tablet; Take 1 tablet (1,000 mg total) by mouth 2 (two) times daily with a meal.  Dispense: 60 tablet; Refill: 3 - COMPLETE METABOLIC PANEL WITH GFR; Future - Lipid Panel w/reflex Direct LDL; Future - Microalbumin / creatinine urine ratio; Future - ACCU-CHEK SOFTCLIX LANCETS lancets; Use to test blood sugar 3 times daily as instructed. Dx code: 250.50  Dispense: 100 each; Refill: 11 - glucose blood (ACCU-CHEK AVIVA PLUS) test strip; Use to test blood sugar 3 times daily as instructed. Dx code: E11.319  Dispense: 100 each; Refill: 11 - Blood Glucose Monitoring Suppl (ACCU-CHEK AVIVA) device; Use as instructed daily.  Dispense: 1 each; Refill: 0  2. Intertrigo Advised to allow aeration of skin while at home Weight loss - clotrimazole (LOTRIMIN) 1 % cream; Apply 1 application topically 2 (two) times daily.  Dispense: 30 g; Refill: 0  3. Dyspareunia in female - Ambulatory referral to Gynecology  4. Essential  hypertension Uncontrolled Lisinopril added to her regimen - amLODipine (NORVASC) 10 MG tablet; Take 1 tablet (10 mg total) by mouth daily.  Dispense: 30 tablet; Refill: 3 - lisinopril (PRINIVIL,ZESTRIL) 5 MG tablet; Take 1 tablet (5 mg total) by mouth daily.  Dispense: 30 tablet; Refill: 3  5. Pure hypercholesterolemia Will send off lipid panel - atorvastatin (LIPITOR) 20 MG tablet; Take 1 tablet (20 mg total) by mouth daily.  Dispense: 30 tablet; Refill: 3   Meds ordered this encounter  Medications  . amLODipine (NORVASC) 10 MG tablet    Sig: Take 1 tablet (10 mg total) by mouth daily.    Dispense:  30 tablet    Refill:  3  . gabapentin (NEURONTIN) 300 MG capsule    Sig: Take 1 capsule (300 mg total) by mouth 3 (three) times daily.    Dispense:  90 capsule    Refill:  3  . Insulin Glargine (LANTUS SOLOSTAR) 100 UNIT/ML Solostar Pen    Sig: Inject 35 Units into the skin at bedtime.    Dispense:  5 pen    Refill:  3  . metFORMIN (GLUCOPHAGE) 1000 MG tablet    Sig: Take 1 tablet (1,000 mg total) by mouth 2 (two) times daily with a meal.    Dispense:  60 tablet    Refill:  3  . atorvastatin (LIPITOR) 20 MG tablet    Sig: Take 1 tablet (20 mg total) by mouth daily.    Dispense:  30 tablet    Refill:  3  . lisinopril (PRINIVIL,ZESTRIL) 5 MG tablet    Sig: Take 1 tablet (5 mg total) by mouth daily.    Dispense:  30 tablet    Refill:  3  . clotrimazole (LOTRIMIN) 1 % cream    Sig: Apply 1 application topically 2 (two) times daily.    Dispense:  30 g    Refill:  0  . ACCU-CHEK SOFTCLIX LANCETS lancets    Sig: Use to test blood sugar 3 times daily as instructed. Dx code: 250.50    Dispense:  100 each    Refill:  11  . glucose blood (ACCU-CHEK AVIVA PLUS) test strip    Sig: Use to test blood sugar 3 times daily as instructed. Dx code: E11.319    Dispense:  100 each    Refill:  11  . Blood Glucose Monitoring Suppl (ACCU-CHEK AVIVA) device    Sig: Use as instructed daily.     Dispense:  1 each    Refill:  0    Follow-up: Return in about 1 month (around 11/07/2016) for Follow-up on diabetes mellitus.Arnoldo Morale MD

## 2016-10-07 NOTE — Patient Instructions (Signed)
Dyspareunia, Female Dyspareunia is pain that is associated with sexual activity. This can affect any part of the genitals or lower abdomen, and there are many possible causes. This condition ranges from mild to severe. Depending on the cause, dyspareunia may get better with treatment, or it may return (recur) over time. What are the causes? The cause of this condition is not always known. Possible causes include:  Cancer.  Psychological factors, such as depression, anxiety, or previous traumatic experiences.  Severe pain and tenderness of the skin around the vagina (vulva) when it is touched (vulvar vestibulitis syndrome).  Infection of the pelvis or the vulva.  Infection of the vagina.  Painful, involuntary tightening (contraction) of the vaginal muscles when anything is put inside the vagina (vaginismus).  Allergic reaction.  Ovarian cysts.  Solid growths of tissue (tumors) in the ovaries or the uterus.  Scar tissue in the ovaries, vagina, or pelvis.  Vaginal dryness.  Thinning of the tissue (atrophy) of the vulva and vagina.  Skin conditions that affect the vulva (vulvar dermatoses), such as lichen sclerosus or lichen planus.  Endometriosis.  Tubal pregnancy.  A tilted uterus.  Uterine prolapse.  Adhesions in the vagina.  Bladder problems.  Intestinal problems.  Certain medicines.  Medical conditions such as diabetes, arthritis, or thyroid disease. What increases the risk? The following factors may make you more likely to develop this condition:  Having experienced physical or sexual trauma.  Having given birth more than once.  Taking birth control pills.  Having gone through menopause.  Having recently given birth, typically within the past 3-6 months.  Breastfeeding. What are the signs or symptoms? The main symptom of this condition is pain in any part of the genitals or lower abdomen during or after sexual activity. This may include pain during  sexual arousal, genital stimulation, or orgasm. Pain may get worse when anything is inserted into the vagina, or when the genitals are touched in any way, such as when sitting or wearing pants. Pain can range from mild to severe, depending on the cause of the condition. In some cases, symptoms go away with treatment and return (recur) at a later date. How is this diagnosed? This condition may be diagnosed based on:  Your symptoms, including:  Where your pain is located.  When your pain occurs.  Your medical history.  A physical exam. This may include a pelvic exam and a Pap test. This is a screening test that is used to check for signs of cancer of the vagina, cervix, and uterus.  Tests, including:  Blood tests.  Ultrasound. This uses sound waves to make a picture of the area that is being tested.  Urine culture. This test involves checking a urine sample for signs of infection.  Culture test. This is when your health care provider uses a swab to collect a sample of vaginal fluid. The sample is checked for signs of infection.  X-rays.  MRI.  CT scan.  Laparoscopy. This is a procedure in which a small incision is made in your lower abdomen and a lighted, pencil-sized instrument (laparoscope) is passed through the incision and used to look inside your pelvis. You may be referred to a health care provider who specializes in women's health (gynecologist). In some cases, diagnosing the cause of dyspareunia can be difficult. How is this treated? Treatment depends on the cause of your condition and your symptoms. In most cases, you may need to stop sexual activity until your symptoms improve. Treatment may  include:  Lubricants.  Kegel exercises or vaginal dilators.  Medicated skin creams.  Medicated vaginal creams.  Hormonal therapy.  Antibiotic medicine to prevent or fight infection.  Medicines that help to relieve pain.  Medicines that treat depression  (antidepressants).  Psychological counseling.  Sex therapy.  Surgery. Follow these instructions at home: Lifestyle   Avoid tight clothing and irritating materials around your genital and abdominal area.  Use water-based lubricants as needed. Avoid oil-based lubricants.  Do not use any products that irritate you. This may include certain condoms, spermicides, lubricants, soaps, tampons, vaginal sprays, or douches.  Always practice safe sex. Talk with your health care provider about which form of birth control (contraception) is best for you.  Maintain open communication with your sexual partner. General instructions   Take over-the-counter and prescription medicines only as told by your health care provider.  If you had tests done, it is your responsibility to get your tests results. Ask your health care provider or the department performing the test when your results will be ready.  Urinate before you engage in sexual activity.  Consider joining a support group.  Keep all follow-up visits as told by your health care provider. This is important. Contact a health care provider if:  You develop vaginal bleeding after sexual intercourse.  You develop a lump at the opening of your vagina. Seek medical care even if the lump is painless.  You have:  Abnormal vaginal discharge.  Vaginal dryness.  Itchiness or irritation of your vulva or vagina.  A new rash.  Symptoms that get worse or do not improve with treatment.  A fever.  Pain when you urinate.  Blood in your urine. Get help right away if:  You develop severe pain in your abdomen during or shortly after sexual intercourse.  You pass out after having sexual intercourse. This information is not intended to replace advice given to you by your health care provider. Make sure you discuss any questions you have with your health care provider. Document Released: 07/25/2007 Document Revised: 11/14/2015 Document  Reviewed: 02/04/2015 Elsevier Interactive Patient Education  2017 [employer].

## 2016-10-11 ENCOUNTER — Other Ambulatory Visit: Payer: Medicaid Other

## 2016-11-08 ENCOUNTER — Encounter: Payer: Self-pay | Admitting: Family Medicine

## 2016-11-08 ENCOUNTER — Ambulatory Visit: Payer: Medicaid Other

## 2016-11-08 ENCOUNTER — Ambulatory Visit: Payer: Medicaid Other | Attending: Family Medicine | Admitting: Family Medicine

## 2016-11-08 VITALS — BP 144/85 | HR 92 | Temp 97.6°F | Ht 66.0 in | Wt 215.8 lb

## 2016-11-08 DIAGNOSIS — I10 Essential (primary) hypertension: Secondary | ICD-10-CM

## 2016-11-08 DIAGNOSIS — Z79899 Other long term (current) drug therapy: Secondary | ICD-10-CM | POA: Insufficient documentation

## 2016-11-08 DIAGNOSIS — E11311 Type 2 diabetes mellitus with unspecified diabetic retinopathy with macular edema: Secondary | ICD-10-CM | POA: Diagnosis not present

## 2016-11-08 DIAGNOSIS — Z794 Long term (current) use of insulin: Secondary | ICD-10-CM | POA: Insufficient documentation

## 2016-11-08 DIAGNOSIS — E785 Hyperlipidemia, unspecified: Secondary | ICD-10-CM | POA: Diagnosis not present

## 2016-11-08 DIAGNOSIS — E7800 Pure hypercholesterolemia, unspecified: Secondary | ICD-10-CM

## 2016-11-08 DIAGNOSIS — Z1159 Encounter for screening for other viral diseases: Secondary | ICD-10-CM

## 2016-11-08 DIAGNOSIS — B182 Chronic viral hepatitis C: Secondary | ICD-10-CM

## 2016-11-08 DIAGNOSIS — R0609 Other forms of dyspnea: Secondary | ICD-10-CM | POA: Diagnosis not present

## 2016-11-08 DIAGNOSIS — R0600 Dyspnea, unspecified: Secondary | ICD-10-CM

## 2016-11-08 LAB — GLUCOSE, POCT (MANUAL RESULT ENTRY): POC Glucose: 295 mg/dl — AB (ref 70–99)

## 2016-11-08 MED ORDER — INSULIN PEN NEEDLE 32G X 4 MM MISC: 11 refills | Status: DC
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
## 2016-11-08 NOTE — Progress Notes (Signed)
Med refills

## 2016-11-08 NOTE — Patient Instructions (Signed)
Diabetes Mellitus and Exercise Exercising regularly is important for your overall health, especially when you have diabetes (diabetes mellitus). Exercising is not only about losing weight. It has many health benefits, such as increasing muscle strength and bone density and reducing body fat and stress. This leads to improved fitness, flexibility, and endurance, all of which result in better overall health. Exercise has additional benefits for people with diabetes, including:  Reducing appetite.  Helping to lower and control blood glucose.  Lowering blood pressure.  Helping to control amounts of fatty substances (lipids) in the blood, such as cholesterol and triglycerides.  Helping the body to respond better to insulin (improving insulin sensitivity).  Reducing how much insulin the body needs.  Decreasing the risk for heart disease by:  Lowering cholesterol and triglyceride levels.  Increasing the levels of good cholesterol.  Lowering blood glucose levels. What is my activity plan? Your health care provider or certified diabetes educator can help you make a plan for the type and frequency of exercise (activity plan) that works for you. Make sure that you:  Do at least 150 minutes of moderate-intensity or vigorous-intensity exercise each week. This could be brisk walking, biking, or water aerobics.  Do stretching and strength exercises, such as yoga or weightlifting, at least 2 times a week.  Spread out your activity over at least 3 days of the week.  Get some form of physical activity every day.  Do not go more than 2 days in a row without some kind of physical activity.  Avoid being inactive for more than 90 minutes at a time. Take frequent breaks to walk or stretch.  Choose a type of exercise or activity that you enjoy, and set realistic goals.  Start slowly, and gradually increase the intensity of your exercise over time. What do I need to know about managing my  diabetes?  Check your blood glucose before and after exercising.  If your blood glucose is higher than 240 mg/dL (13.3 mmol/L) before you exercise, check your urine for ketones. If you have ketones in your urine, do not exercise until your blood glucose returns to normal.  Know the symptoms of low blood glucose (hypoglycemia) and how to treat it. Your risk for hypoglycemia increases during and after exercise. Common symptoms of hypoglycemia can include:  Hunger.  Anxiety.  Sweating and feeling clammy.  Confusion.  Dizziness or feeling light-headed.  Increased heart rate or palpitations.  Blurry vision.  Tingling or numbness around the mouth, lips, or tongue.  Tremors or shakes.  Irritability.  Keep a rapid-acting carbohydrate snack available before, during, and after exercise to help prevent or treat hypoglycemia.  Avoid injecting insulin into areas of the body that are going to be exercised. For example, avoid injecting insulin into:  The arms, when playing tennis.  The legs, when jogging.  Keep records of your exercise habits. Doing this can help you and your health care provider adjust your diabetes management plan as needed. Write down:  Food that you eat before and after you exercise.  Blood glucose levels before and after you exercise.  The type and amount of exercise you have done.  When your insulin is expected to peak, if you use insulin. Avoid exercising at times when your insulin is peaking.  When you start a new exercise or activity, work with your health care provider to make sure the activity is safe for you, and to adjust your insulin, medicines, or food intake as needed.  Drink plenty   of water while you exercise to prevent dehydration or heat stroke. Drink enough fluid to keep your urine clear or pale yellow. This information is not intended to replace advice given to you by your health care provider. Make sure you discuss any questions you have with  your health care provider. Document Released: 09/25/2003 Document Revised: 01/23/2016 Document Reviewed: 12/15/2015 Elsevier Interactive Patient Education  2017 [employer]  

## 2016-11-09 ENCOUNTER — Other Ambulatory Visit: Payer: Self-pay | Admitting: Family Medicine

## 2016-11-09 DIAGNOSIS — B182 Chronic viral hepatitis C: Secondary | ICD-10-CM

## 2016-11-09 HISTORY — DX: Chronic viral hepatitis C: B18.2

## 2016-11-09 LAB — MICROALBUMIN / CREATININE URINE RATIO
Creatinine, Urine: 77.4 mg/dL
Microalb/Creat Ratio: 5328.9 mg/g creat — ABNORMAL HIGH (ref 0.0–30.0)
Microalbumin, Urine: 4124.6 ug/mL

## 2016-11-09 LAB — CMP14+EGFR
A/G RATIO: 1.3 (ref 1.2–2.2)
ALT: 15 IU/L (ref 0–32)
AST: 14 IU/L (ref 0–40)
Albumin: 3.8 g/dL (ref 3.5–5.5)
Alkaline Phosphatase: 66 IU/L (ref 39–117)
BUN/Creatinine Ratio: 11 (ref 9–23)
BUN: 7 mg/dL (ref 6–24)
Bilirubin Total: 0.4 mg/dL (ref 0.0–1.2)
CALCIUM: 9 mg/dL (ref 8.7–10.2)
CHLORIDE: 100 mmol/L (ref 96–106)
CO2: 26 mmol/L (ref 18–29)
Creatinine, Ser: 0.66 mg/dL (ref 0.57–1.00)
GFR calc Af Amer: 115 mL/min/{1.73_m2} (ref >59)
GFR, EST NON AFRICAN AMERICAN: 100 mL/min/{1.73_m2} (ref >59)
Globulin, Total: 3 g/dL (ref 1.5–4.5)
Glucose: 316 mg/dL — ABNORMAL HIGH (ref 65–99)
POTASSIUM: 4 mmol/L (ref 3.5–5.2)
Sodium: 139 mmol/L (ref 134–144)
Total Protein: 6.8 g/dL (ref 6.0–8.5)

## 2016-11-09 LAB — LIPID PANEL
CHOL/HDL RATIO: 2.2 ratio (ref 0.0–4.4)
Cholesterol, Total: 172 mg/dL (ref 100–199)
HDL: 80 mg/dL (ref >39)
LDL Calculated: 72 mg/dL (ref 0–99)
TRIGLYCERIDES: 100 mg/dL (ref 0–149)
VLDL Cholesterol Cal: 20 mg/dL (ref 5–40)

## 2016-11-09 LAB — BRAIN NATRIURETIC PEPTIDE: BNP: 14 pg/mL (ref 0.0–100.0)

## 2016-11-09 LAB — COMMENT2 - HEP PANEL

## 2016-11-09 LAB — HEPATITIS C ANTIBODY (REFLEX)

## 2016-11-10 ENCOUNTER — Other Ambulatory Visit: Payer: Self-pay | Admitting: Family Medicine

## 2016-11-10 DIAGNOSIS — L304 Erythema intertrigo: Secondary | ICD-10-CM

## 2016-11-11 ENCOUNTER — Encounter: Payer: Self-pay | Admitting: Obstetrics and Gynecology

## 2016-11-11 ENCOUNTER — Encounter: Payer: Self-pay | Admitting: Gastroenterology

## 2016-11-11 ENCOUNTER — Other Ambulatory Visit (HOSPITAL_COMMUNITY)
Admission: RE | Admit: 2016-11-11 | Discharge: 2016-11-11 | Disposition: A | Discharge status: Home / Self Care | Payer: Medicaid Other | Source: Ambulatory Visit | Attending: Obstetrics and Gynecology | Admitting: Obstetrics and Gynecology

## 2016-11-11 ENCOUNTER — Ambulatory Visit (INDEPENDENT_AMBULATORY_CARE_PROVIDER_SITE_OTHER): Payer: Medicaid Other | Admitting: Obstetrics and Gynecology

## 2016-11-11 VITALS — BP 165/99 | HR 87 | Ht 66.0 in | Wt 213.5 lb

## 2016-11-11 DIAGNOSIS — Z1231 Encounter for screening mammogram for malignant neoplasm of breast: Secondary | ICD-10-CM

## 2016-11-11 DIAGNOSIS — Z8742 Personal history of other diseases of the female genital tract: Secondary | ICD-10-CM

## 2016-11-11 DIAGNOSIS — Z1239 Encounter for other screening for malignant neoplasm of breast: Secondary | ICD-10-CM

## 2016-11-11 DIAGNOSIS — Z1211 Encounter for screening for malignant neoplasm of colon: Secondary | ICD-10-CM | POA: Diagnosis not present

## 2016-11-11 DIAGNOSIS — N941 Unspecified dyspareunia: Secondary | ICD-10-CM | POA: Diagnosis not present

## 2016-11-11 DIAGNOSIS — Z8614 Personal history of Methicillin resistant Staphylococcus aureus infection: Secondary | ICD-10-CM

## 2016-11-11 DIAGNOSIS — Z124 Encounter for screening for malignant neoplasm of cervix: Secondary | ICD-10-CM | POA: Insufficient documentation

## 2016-11-11 NOTE — Patient Instructions (Signed)
Colonoscopy, Adult A colonoscopy is an exam to look at the large intestine. It is done to check for problems, such as:  Lumps (tumors).  Growths (polyps).  Swelling (inflammation).  Bleeding. What happens before the procedure? Eating and drinking  Follow instructions from your doctor about eating and drinking. These instructions may include:  A few days before the procedure - follow a low-fiber diet.  Avoid nuts.  Avoid seeds.  Avoid dried fruit.  Avoid raw fruits.  Avoid vegetables.  1-3 days before the procedure - follow a clear liquid diet. Avoid liquids that have red or purple dye. Drink only clear liquids, such as:  Clear broth or bouillon.  Black coffee or tea.  Clear juice.  Clear soft drinks or sports drinks.  Gelatin dessert.  Popsicles.  On the day of the procedure - do not eat or drink anything during the 2 hours before the procedure. Bowel prep  If you were prescribed an oral bowel prep:  Take it as told by your doctor. Starting the day before your procedure, you will need to drink a lot of liquid. The liquid will cause you to poop (have bowel movements) until your poop is almost clear or light green.  If your skin or butt gets irritated from diarrhea, you may:  Wipe the area with wipes that have medicine in them, such as adult wet wipes with aloe and vitamin E.  Put something on your skin that soothes the area, such as petroleum jelly.  If you throw up (vomit) while drinking the bowel prep, take a break for up to 60 minutes. Then begin the bowel prep again. If you keep throwing up and you cannot take the bowel prep without throwing up, call your doctor. General instructions   Ask your doctor about changing or stopping your normal medicines. This is important if you take diabetes medicines or blood thinners.  Plan to have someone take you home from the hospital or clinic. What happens during the procedure?  An IV tube may be put into one of your  veins.  You will be given medicine to help you relax (sedative).  To reduce your risk of infection:  Your doctors will wash their hands.  Your anal area will be washed with soap.  You will be asked to lie on your side with your knees bent.  Your doctor will get a long, thin, flexible tube ready. The tube will have a camera and a light on the end.  The tube will be put into your anus.  The tube will be gently put into your large intestine.  Air will be delivered into your large intestine to keep it open. You may feel some pressure or cramping.  The camera will be used to take photos.  A small tissue sample may be removed from your body to be looked at under a microscope (biopsy). If any possible problems are found, the tissue will be sent to a lab for testing.  If small growths are found, your doctor may remove them and have them checked for cancer.  The tube that was put into your anus will be slowly removed. The procedure may vary among doctors and hospitals. What happens after the procedure?  Your doctor will check on you often until the medicines you were given have worn off.  Do not drive for 24 hours after the procedure.  You may have a small amount of blood in your poop.  You may pass gas.  You may  have mild cramps or bloating in your belly (abdomen).  It is up to you to get the results of your procedure. Ask your doctor, or the department performing the procedure, when your results will be ready. This information is not intended to replace advice given to you by your health care provider. Make sure you discuss any questions you have with your health care provider. Document Released: 08/07/2010 Document Revised: 05/05/2016 Document Reviewed: 09/16/2015 Elsevier Interactive Patient Education  2017 [place]. Menopause and Hormone Replacement Therapy What is hormone replacement therapy? Hormone replacement therapy (HRT) is the use of artificial (synthetic)  hormones to replace hormones that your body stops producing during menopause. Menopause is the normal time of life when menstrual periods stop completely and the ovaries stop producing the female hormones estrogen and progesterone. This lack of hormones can affect your health and cause undesirable symptoms. HRT can relieve some of those symptoms. What are my options for HRT? HRT may consist of the synthetic hormones estrogen and progestin, or it may consist of only estrogen (estrogen-only therapy). You and your health care provider will decide which form of HRT is best for you. If you choose to be on HRT and you have a uterus, estrogen and progestin are usually prescribed. Estrogen-only therapy is used for women who do not have a uterus. Possible options for taking HRT include:  Pills.  Patches.  Gels.  Sprays.  Vaginal cream.  Vaginal rings.  Vaginal inserts. The amount of hormone(s) that you take and how long you take the hormone(s) varies depending on your individual health. It is important to:  Begin HRT with the lowest possible dosage.  Stop HRT as soon as your health care provider tells you to stop.  Work with your health care provider so that you feel informed and comfortable with your decisions. What are the benefits of HRT? HRT can reduce the frequency and severity of menopausal symptoms. Benefits of HRT vary depending on the menopausal symptoms that you have, the severity of your symptoms, and your overall health. HRT may help to improve the following menopausal symptoms:  Hot flashes and night sweats. These are sudden feelings of heat that spread over the face and body. The skin may turn red, like a blush. Night sweats are hot flashes that happen while you are sleeping or trying to sleep.  Bone loss (osteoporosis). The body loses calcium more quickly after menopause, causing the bones to become weaker. This can increase the risk for bone breaks (fractures).  Vaginal  dryness. The lining of the vagina can become thin and dry, which can cause pain during sexual intercourse or cause infection, burning, or itching.  Urinary tract infections.  Urinary incontinence. This is a decreased ability to control when you urinate.  Irritability.  Short-term memory problems. What are the risks of HRT? Risks of HRT vary depending on your individual health and medical history. Risks of HRT also depend on whether you receive both estrogen and progestin or you receive estrogen only.HRT may increase the risk of:  Spotting. This is when a small amount of bloodleaks from the vagina unexpectedly.  Endometrial cancer. This cancer is in the lining of the uterus (endometrium).  Breast cancer.  Increased density of breast tissue. This can make it harder to find breast cancer on a breast X-ray (mammogram).  Stroke.  Heart attack.  Blood clots.  Gallbladder disease. Risks of HRT can increase if you have any of the following conditions:  Endometrial cancer.  Liver disease.  Heart disease.  Breast cancer.  History of blood clots.  History of stroke. How should I care for myself while I am on HRT?  Take over-the-counter and prescription medicines only as told by your health care provider.  Get mammograms, pelvic exams, and medical checkups as often as told by your health care provider.  Have Pap tests done as often as told by your health care provider. A Pap test is sometimes called a Pap smear. It is a screening test that is used to check for signs of cancer of the cervix and vagina. A Pap test can also identify the presence of infection or precancerous changes. Pap tests may be done:  Every 3 years, starting at age 21.  Every 5 years, starting after age 10, in combination with testing for human papillomavirus (HPV).  More often or less often depending on other medical conditions you have, your age, and other risk factors.  It is your responsibility to  get your Pap test results. Ask your health care provider or the department performing the test when your results will be ready.  Keep all follow-up visits as told by your health care provider. This is important. When should I seek medical care? Talk with your health care provider if:  You have any of these:  Pain or swelling in your legs.  Shortness of breath.  Chest pain.  Lumps or changes in your breasts or armpits.  Slurred speech.  Pain, burning, or bleeding when you urine.  You develop any of these:  Unusual vaginal bleeding.  Dizziness or headaches.  Weakness or numbness in any part of your arms or legs.  Pain in your abdomen. This information is not intended to replace advice given to you by your health care provider. Make sure you discuss any questions you have with your health care provider. Document Released: 04/03/2003 Document Revised: 06/01/2016 Document Reviewed: 01/06/2015 Elsevier Interactive Patient Education  2017 [employer].

## 2016-11-11 NOTE — Progress Notes (Signed)
Obstetrics and Gynecology New Patient Evaluation  Appointment Date: 11/11/2016  OBGYN Clinic: [facility]  Primary Care Provider: Arnoldo Morale  Referring Provider: Arnoldo Morale, MD  Chief Complaint:  Chief Complaint  Patient presents with  . Dyspareunia    History of Present Illness: [name] [name] is a 56 y.o. African-American G1P1001 (LMP: approx 5 years ago per patient), seen for the above chief complaint. Her past medical history is significant for BMI 35, HTN, DM2, h/o STDs, fibroids, ovarian cysts, h/o HSV, h/o c-section  Patient states that for the past year she's had pain with intercourse. She states that the pain is sharp and needle like and happens when insertion. She states that it happens each time with intercourse, no matter the partner, and hasn't been helped with OTC lubricants. She also denies any post menopausal bleeding or spotting since her AUB work up 5 years ago and she denies any post coital spotting or bleeding.   She endorses vaginal dryness but no vaginal itching or discharge. She denies any nausea, vomiting or constipation, diarrhea.    Review of Systems: as noted in the History of Present Illness.  Past Medical History:  Past Medical History:  Diagnosis Date  . Anemia   . Diabetes mellitus   . Fibroids   . 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 Hypertension   . MRSA (methicillin resistant Staphylococcus aureus)   . Trichomonas   . VAGINITIS, BACTERIAL, RECURRENT 08/15/2007   Qualifier: Diagnosis of  By: Radene Ou MD, [place]      Past Surgical History:  Past Surgical History:  Procedure Laterality Date  . CESAREAN SECTION     breech    Past Obstetrical History:  OB History  Gravida Para Term Preterm AB Living  1 1 1     1   SAB TAB Ectopic Multiple Live Births          1    # Outcome Date GA Lbr Len/2nd Weight Sex Delivery Anes PTL Lv  1 Term 03/25/83 [redacted]w[redacted]d   F CS-LTranv EPI  LIV       Past Gynecological History: As per HPI. Periods: No Last pap: NILM 2011 Yes.   history of STIs.   No. HRT use.    Social History:  Social History   Social History  . Marital status: Legally Separated    Spouse name: N/A  . Number of children: N/A  . Years of education: N/A   Occupational History  . Not on file.   Social History Main Topics  . Smoking status: Never Smoker  . Smokeless tobacco: Never Used  . Alcohol use No  . Drug use: No     Comment: 8 years ago for the cocaine  1 year ago with marijuana  . Sexual activity: Yes    Birth control/ protection: None   Other Topics Concern  . Not on file   Social History Narrative  . No narrative on file    Family History:  Family History  Problem Relation Age of Onset  . Other Neg Hx    Health Maintenance:  Mammogram Yes.  , which was done approx 2 years ago per patient and was normal;  Colonoscopy: never  Medications [name] had no medications administered during this visit. Current Outpatient Prescriptions  Medication Sig Dispense Refill  . ACCU-CHEK SOFTCLIX LANCETS lancets Use to test blood sugar 3 times daily as instructed. Dx code: 250.50 100 each 11  .  albuterol (PROVENTIL HFA;VENTOLIN HFA) 108 (90 Base) MCG/ACT inhaler Inhale 2 puffs into the lungs every 6 (six) hours as needed for wheezing or shortness of breath. 1 Inhaler 3  . amLODipine (NORVASC) 10 MG tablet Take 1 tablet (10 mg total) by mouth daily. 30 tablet 3  . atorvastatin (LIPITOR) 20 MG tablet Take 1 tablet (20 mg total) by mouth daily. 30 tablet 3  . Blood Glucose Monitoring Suppl (ACCU-CHEK AVIVA) device Use as instructed daily. 1 each 0  . clotrimazole (LOTRIMIN) 1 % cream Apply 1 application topically 2 (two) times daily. 30 g 0  . gabapentin (NEURONTIN) 300 MG capsule Take 1 capsule (300 mg total) by mouth 3 (three) times daily. 90 capsule 3  . glucose blood (ACCU-CHEK AVIVA PLUS) test strip Use to test blood sugar 3 times daily as  instructed. Dx code: E11.319 100 each 11  . insulin aspart (NOVOLOG FLEXPEN) 100 UNIT/ML FlexPen 0-12 units three times daily as per sliding scale. 3 pen 3  . Insulin Glargine (LANTUS SOLOSTAR) 100 UNIT/ML Solostar Pen Inject 35 Units into the skin at bedtime. 5 pen 3  . Insulin Pen Needle (BD PEN NEEDLE NANO U/F) 32G X 4 MM MISC Use to inject insulin 4 times daily. 150 each 11  . lisinopril (PRINIVIL,ZESTRIL) 5 MG tablet Take 1 tablet (5 mg total) by mouth daily. 30 tablet 3  . metFORMIN (GLUCOPHAGE) 1000 MG tablet Take 1 tablet (1,000 mg total) by mouth 2 (two) times daily with a meal. 60 tablet 3  . nitroGLYCERIN (NITROSTAT) 0.4 MG SL tablet Place 0.4 mg under the tongue every 5 (five) minutes as needed for chest pain.     No current facility-administered medications for this visit.     Allergies Patient has no known allergies.   Physical Exam:  BP (!) 165/99   Pulse 87   Ht 5\' 6"  (1.676 m)   Wt 213 lb 8 oz (96.8 kg)   LMP 04/01/2010   BMI 34.46 kg/m  Body mass index is 34.46 kg/m. General appearance: Well nourished, well developed female in no acute distress.  Cardiovascular: normal s1 and s2.  No murmurs, rubs or gallops. Respiratory:  Clear to auscultation bilateral. Normal respiratory effort Abdomen: positive bowel sounds and no masses, hernias; diffusely non tender to palpation, non distended Neuro/Psych:  Normal mood and affect.  Skin:  Warm and dry.  Lymphatic:  No inguinal lymphadenopathy.   Pelvic exam: is not limited by body habitus EGBUS: within normal limits with mild to moderate atrophy, nttp; Vagina: mild to moderate atrophy with minimal rugae, no blood or discharge in the vault. Cervix: normal appearing cervix without tenderness, discharge or lesions. Uterus:  nonenlarged and non tender and Adnexa:  normal adnexa and no mass, fullness, tenderness Rectovaginal: deferred  Laboratory: none  Radiology: none  Assessment: pt stable  Plan:  1. Screening for  breast cancer scheduled - MM Digital Screening; Future  2. Pap smear for cervical cancer screening Pap updated today  3. Dyspareunia in female D/w pt re: oil and water based lubricants and, if she wasn't using them already, to try oil based lubricants, but given she's tried OTCs already, I told her that I would recommend vaginal estrogen. D/w her theoretical risks of increased BP, VTE, stroke, MI but literature and data hasn't borne that out for topical use. f/u pending studies and call pt back with results and consideration for topical estrogen.  - Cytology - PAP - US Transvaginal Non-OB; Future - US Pelvis  Complete; Future  4. Screen for colon cancer Pt amenable to screening and referral set up for patient - Ambulatory referral to Gastroenterology  6. History of ovarian cyst She had a 5cm RO simple cyst in 2013 and yearly follow ups recommended. Will repeat and base follow up on current results.  - US Transvaginal Non-OB; Future - US Pelvis Complete; Future  RTC 18m  Durene Romans MD Attending [facility])

## 2016-11-12 ENCOUNTER — Other Ambulatory Visit: Payer: Self-pay | Admitting: Family Medicine

## 2016-11-12 ENCOUNTER — Encounter: Payer: Medicaid Other | Admitting: Obstetrics & Gynecology

## 2016-11-12 DIAGNOSIS — B182 Chronic viral hepatitis C: Secondary | ICD-10-CM

## 2016-11-12 LAB — CYTOLOGY - PAP
BACTERIAL VAGINITIS: POSITIVE — AB
Candida vaginitis: NEGATIVE
Chlamydia: NEGATIVE
DIAGNOSIS: NEGATIVE
HPV: NOT DETECTED
NEISSERIA GONORRHEA: NEGATIVE
TRICH (WINDOWPATH): NEGATIVE

## 2016-11-15 ENCOUNTER — Other Ambulatory Visit: Payer: Self-pay

## 2016-11-15 MED ORDER — METRONIDAZOLE 500 MG PO TABS: 500.0000 mg | ORAL_TABLET | Freq: Two times a day (BID) | ORAL | 0 refills | Status: DC

## 2016-11-15 NOTE — Telephone Encounter (Signed)
Informed patient of BV results and need to start Flagyl. Rx has been called into the pharmacy.

## 2016-11-16 ENCOUNTER — Ambulatory Visit (HOSPITAL_COMMUNITY)
Admission: RE | Admit: 2016-11-16 | Discharge: 2016-11-16 | Disposition: A | Discharge status: Home / Self Care | Payer: Medicaid Other | Source: Ambulatory Visit | Attending: Obstetrics and Gynecology | Admitting: Obstetrics and Gynecology

## 2016-11-16 DIAGNOSIS — D259 Leiomyoma of uterus, unspecified: Secondary | ICD-10-CM | POA: Diagnosis not present

## 2016-11-16 DIAGNOSIS — Z8742 Personal history of other diseases of the female genital tract: Secondary | ICD-10-CM | POA: Diagnosis present

## 2016-11-16 DIAGNOSIS — N941 Unspecified dyspareunia: Secondary | ICD-10-CM | POA: Diagnosis present

## 2016-11-17 ENCOUNTER — Other Ambulatory Visit: Payer: Medicaid Other

## 2016-11-17 ENCOUNTER — Ambulatory Visit (INDEPENDENT_AMBULATORY_CARE_PROVIDER_SITE_OTHER): Payer: Medicaid Other | Admitting: Gastroenterology

## 2016-11-17 ENCOUNTER — Encounter: Payer: Self-pay | Admitting: Gastroenterology

## 2016-11-17 ENCOUNTER — Telehealth: Payer: Self-pay

## 2016-11-17 VITALS — BP 142/80 | HR 68 | Ht 66.0 in | Wt 215.4 lb

## 2016-11-17 DIAGNOSIS — R197 Diarrhea, unspecified: Secondary | ICD-10-CM

## 2016-11-17 DIAGNOSIS — Z800 Family history of malignant neoplasm of digestive organs: Secondary | ICD-10-CM

## 2016-11-17 LAB — SPECIMEN STATUS REPORT

## 2016-11-17 MED ORDER — NA SULFATE-K SULFATE-MG SULF 17.5-3.13-1.6 GM/177ML PO SOLN: 1.0000 | ORAL | 0 refills | Status: DC

## 2016-11-17 MED ORDER — LOPERAMIDE HCL 2 MG PO TABS: 2.0000 mg | ORAL_TABLET | Freq: Four times a day (QID) | ORAL | 1 refills | Status: DC | PRN

## 2016-11-17 NOTE — Patient Instructions (Signed)
We have sent the following medications to your pharmacy for you to pick up at your convenience:  Imodium every 6 hours as needed for diarrhea.   You have been scheduled for a colonoscopy. Please follow written instructions given to you at your visit today.  Please pick up your prep supplies at the pharmacy within the next 1-3 days. If you use inhalers (even only as needed), please bring them with you on the day of your procedure. Your physician has requested that you go to www.startemmi.com and enter the access code given to you at your visit today. This web site gives a general overview about your procedure. However, you should still follow specific instructions given to you by our office regarding your preparation for the procedure.

## 2016-11-17 NOTE — Telephone Encounter (Signed)
Writer called patient with lab results.  Patient stated understanding and stated that ID has called her and she has an appt with them tomorrow at [facility].

## 2016-11-17 NOTE — Telephone Encounter (Signed)
-----   Message from Arnoldo Morale, MD sent at 11/12/2016 12:20 PM EDT ----- Cholesterol is normal. Labs reveal Hep C infection and I have referred her to Infectious disease.

## 2016-11-17 NOTE — Progress Notes (Signed)
Assessment and plans noted. Agree with colonoscopy to evaluate diarrhea and provide colon cancer screening given her family history

## 2016-11-17 NOTE — Progress Notes (Addendum)
11/17/2016 [name] [name] [id number] 24-Dec-1960   HISTORY OF PRESENT ILLNESS:  This is a 56 year old female who is new to our office. She was referred here by her Dr. Ilda Basset, for evaluation regarding diarrhea and to discuss colonoscopy.  The patient has family history of colon cancer in her mother. Patient has never undergone colonoscopy in the past. She reports that she has been experiencing diarrhea for quite some time now, probably at least the past year. It has been worsening significantly recently, however. She says she has a lot of urgency and has had incontinence, which is understandably very embarrassing for her. She has started wearing Depends. She is having diarrhea almost every day. She denies seeing blood in her stool. She has tried Imodium on occasion it seems to help to some degree. She has some abdominal pain, center lower abdomen/suprapubic region. She did have an ultrasound of the pelvis recently that showed a rather large uterine fibroid. She is on metformin 1000 mg twice daily. Says she's been on the medication for a while, but it has been increased sometime in the past year she thinks.  Fairly recent labs reveal mild anemia with hemoglobin of 11.3 g. CMP unremarkable except for a glucose of 316.  Just of note, she has recently been diagnosed with hepatitis C and has been referred to infectious disease.   Past Medical History:  Diagnosis Date  . Anemia   . Diabetes mellitus   . Fibroids   . 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 Hypertension   . MRSA (methicillin resistant Staphylococcus aureus)   . Trichomonas   . VAGINITIS, BACTERIAL, RECURRENT 08/15/2007   Qualifier: Diagnosis of  By: Radene Ou MD, [place]     Past Surgical History:  Procedure Laterality Date  . CESAREAN SECTION     breech    reports that she has never smoked. She has never used smokeless tobacco. She reports that she does not drink alcohol or use drugs. family  history includes Colon cancer in her mother. No Known Allergies    Outpatient Encounter Prescriptions as of 11/17/2016  Medication Sig  . ACCU-CHEK SOFTCLIX LANCETS lancets Use to test blood sugar 3 times daily as instructed. Dx code: 250.50  . albuterol (PROVENTIL HFA;VENTOLIN HFA) 108 (90 Base) MCG/ACT inhaler Inhale 2 puffs into the lungs every 6 (six) hours as needed for wheezing or shortness of breath.  Marland Kitchen amLODipine (NORVASC) 10 MG tablet Take 1 tablet (10 mg total) by mouth daily.  Marland Kitchen atorvastatin (LIPITOR) 20 MG tablet Take 1 tablet (20 mg total) by mouth daily.  . Blood Glucose Monitoring Suppl (ACCU-CHEK AVIVA) device Use as instructed daily.  . clotrimazole (LOTRIMIN) 1 % cream APPLY 1 APPLICATION TOPICALLY 2 TIMES DAILY  . gabapentin (NEURONTIN) 300 MG capsule Take 1 capsule (300 mg total) by mouth 3 (three) times daily.  Marland Kitchen glucose blood (ACCU-CHEK AVIVA PLUS) test strip Use to test blood sugar 3 times daily as instructed. Dx code: E11.319  . insulin aspart (NOVOLOG FLEXPEN) 100 UNIT/ML FlexPen 0-12 units three times daily as per sliding scale.  . Insulin Glargine (LANTUS SOLOSTAR) 100 UNIT/ML Solostar Pen Inject 35 Units into the skin at bedtime.  . Insulin Pen Needle (BD PEN NEEDLE NANO U/F) 32G X 4 MM MISC Use to inject insulin 4 times daily.  Marland Kitchen lisinopril (PRINIVIL,ZESTRIL) 5 MG tablet Take 1 tablet (5 mg total) by mouth daily.  . metFORMIN (GLUCOPHAGE) 1000 MG tablet  Take 1 tablet (1,000 mg total) by mouth 2 (two) times daily with a meal.  . nitroGLYCERIN (NITROSTAT) 0.4 MG SL tablet Place 0.4 mg under the tongue every 5 (five) minutes as needed for chest pain.  Marland Kitchen loperamide (IMODIUM A-D) 2 MG tablet Take 1 tablet (2 mg total) by mouth 4 (four) times daily as needed for diarrhea or loose stools.  . Na Sulfate-K Sulfate-Mg Sulf (SUPREP BOWEL PREP KIT) 17.5-3.13-1.6 GM/180ML SOLN Take 1 kit by mouth as directed.  . [DISCONTINUED] metroNIDAZOLE (FLAGYL) 500 MG tablet Take 1 tablet  (500 mg total) by mouth 2 (two) times daily. (Patient not taking: Reported on 11/17/2016)   No facility-administered encounter medications on file as of 11/17/2016.      REVIEW OF SYSTEMS  : All other systems reviewed and negative except where noted in the History of Present Illness.   PHYSICAL EXAM: BP (!) 142/80   Pulse 68   Ht 5' 6" (1.676 m)   Wt 215 lb 6.4 oz (97.7 kg)   LMP 04/01/2010   SpO2 98%   BMI 34.77 kg/m  General: Well developed black female in no acute distress Head: Normocephalic and atraumatic Eyes:  Sclerae anicteric, conjunctiva pink. Ears: Normal auditory acuity Lungs: Clear throughout to auscultation; no increased WOB. Heart: Regular rate and rhythm Abdomen: Soft, non-distended. Normal bowel sounds.  Mild lower abdominal/suprapubic TTP. Rectal:  Will be done at the time of colonoscopy. Musculoskeletal: Symmetrical with no gross deformities  Skin: No lesions on visible extremities Extremities: No edema  Neurological: Alert oriented x 4, grossly non-focal Psychological:  Alert and cooperative. Normal mood and affect  ASSESSMENT AND PLAN: -Family history of colon cancer in mother:  Patient has never undergone colonoscopy in the past. -Chronic diarrhea:  At least over the past year, but worsening with urgency and incontinence.  Low suspicion for infectious source, but will check Cdiff and O&P.  Will also schedule for colonoscopy to rule IBD, microscopic colitis, etc.  I also mentioned that her high doses of metformin may be contributing to her diarrhea.  Will continue Imodium for now pending that stool studies are negative. -Lower abdominal pain:  Will evaluate with colonoscopy, but may also be from rather large uterine fibroid seen on U/S.  *The risks, benefits, and alternatives to colonoscopy were discussed with the patient and she consents to proceed.   CC:  Arnoldo Morale, MD  CC:  Dr. Ilda Basset

## 2016-11-18 ENCOUNTER — Encounter: Payer: Self-pay | Admitting: Internal Medicine

## 2016-11-18 ENCOUNTER — Telehealth: Payer: Self-pay | Admitting: *Deleted

## 2016-11-18 ENCOUNTER — Ambulatory Visit (INDEPENDENT_AMBULATORY_CARE_PROVIDER_SITE_OTHER): Payer: Medicaid Other | Admitting: Internal Medicine

## 2016-11-18 VITALS — BP 135/85 | HR 91 | Temp 97.9°F | Ht 66.0 in | Wt 217.0 lb

## 2016-11-18 DIAGNOSIS — B182 Chronic viral hepatitis C: Secondary | ICD-10-CM | POA: Diagnosis present

## 2016-11-18 DIAGNOSIS — Z23 Encounter for immunization: Secondary | ICD-10-CM | POA: Diagnosis not present

## 2016-11-18 LAB — CBC WITH DIFFERENTIAL/PLATELET
BASOS ABS: 0 {cells}/uL (ref 0–200)
Basophils Relative: 0 %
EOS PCT: 0 %
Eosinophils Absolute: 0 cells/uL — ABNORMAL LOW (ref 15–500)
HCT: 32.4 % — ABNORMAL LOW (ref 35.0–45.0)
Hemoglobin: 10.7 g/dL — ABNORMAL LOW (ref 11.7–15.5)
LYMPHS ABS: 1936 {cells}/uL (ref 850–3900)
LYMPHS PCT: 44 %
MCH: 27.2 pg (ref 27.0–33.0)
MCHC: 33 g/dL (ref 32.0–36.0)
MCV: 82.2 fL (ref 80.0–100.0)
MONOS PCT: 7 %
MPV: 10.1 fL (ref 7.5–12.5)
Monocytes Absolute: 308 cells/uL (ref 200–950)
Neutro Abs: 2156 cells/uL (ref 1500–7800)
Neutrophils Relative %: 49 %
PLATELETS: 251 10*3/uL (ref 140–400)
RBC: 3.94 MIL/uL (ref 3.80–5.10)
RDW: 14.6 % (ref 11.0–15.0)
WBC: 4.4 10*3/uL (ref 3.8–10.8)

## 2016-11-18 LAB — PROTIME-INR
INR: 1.1
PROTHROMBIN TIME: 11.3 s (ref 9.0–11.5)

## 2016-11-18 LAB — HIV ANTIBODY (ROUTINE TESTING W REFLEX): HIV: NONREACTIVE

## 2016-11-18 MED ORDER — GLECAPREVIR-PIBRENTASVIR 100-40 MG PO TABS: 3.0000 | ORAL_TABLET | Freq: Every day | ORAL | 1 refills | Status: DC

## 2016-11-18 NOTE — Telephone Encounter (Signed)
Patient notified of appointment for ultrasound at [facility] on 12/02/16 at 7:45 AM. Nothing to eat or drink after midnight. Auth #[id number] good through 12/18/16. Myrtis Hopping

## 2016-11-18 NOTE — Addendum Note (Signed)
Addended by: Myrtis Hopping A on: 11/18/2016 11:49 AM   Modules accepted: Orders

## 2016-11-18 NOTE — Progress Notes (Signed)
[facility]   CC: consideration for treatment for chronic hepatitis C  HPI:  +[name] [name] is a 56 y.o. female who presents for initial evaluation and management of chronic hepatitis C.  Patient tested positive earlier this year during routine screening. Hepatitis C-associated risk factors present are: history of crack cocaine over 10 years ago. Patient denies history of blood transfusion, IV drug abuse, renal dialysis, sexual contact with person with liver disease, tattoos. Patient has had other studies performed. Results: hepatitis C RNA by PCR, result: positive, genotype 1b. Patient has not had prior treatment for Hepatitis C. Patient does not have a past history of liver disease. Patient does not have a family history of liver disease. Patient does not  have associated signs or symptoms related to liver disease.  Labs reviewed and confirm chronic hepatitis C with a positive genotype.  Viral load pending.   Records reviewed from Epic.  She recently saw Dr. Henrene Pastor of GI for chronic diarrhea.  Planning colonoscopy.       Patient does not have documented immunity to Hepatitis A. Patient does not have documented immunity to Hepatitis B.    Review of Systems:   Constitutional: negative for fatigue and malaise Gastrointestinal: negative for nausea Integument/breast: negative for rash Musculoskeletal: negative for myalgias and arthralgias All other systems reviewed and are negative       Past Medical History:  Diagnosis Date  . Anemia   . Diabetes mellitus   . Fibroids   . 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 Hypertension   . MRSA (methicillin resistant Staphylococcus aureus)   . Trichomonas   . VAGINITIS, BACTERIAL, RECURRENT 08/15/2007   Qualifier: Diagnosis of  By: Radene Ou MD, [place]      Prior to Admission medications   Medication Sig Start Date End Date Taking? Authorizing Provider  ACCU-CHEK SOFTCLIX LANCETS  lancets Use to test blood sugar 3 times daily as instructed. Dx code: 250.50 10/07/16  Yes Arnoldo Morale, MD  albuterol (PROVENTIL HFA;VENTOLIN HFA) 108 (90 Base) MCG/ACT inhaler Inhale 2 puffs into the lungs every 6 (six) hours as needed for wheezing or shortness of breath. 10/07/16  Yes Arnoldo Morale, MD  amLODipine (NORVASC) 10 MG tablet Take 1 tablet (10 mg total) by mouth daily. 10/07/16  Yes Arnoldo Morale, MD  atorvastatin (LIPITOR) 20 MG tablet Take 1 tablet (20 mg total) by mouth daily. 10/07/16  Yes Arnoldo Morale, MD  Blood Glucose Monitoring Suppl (ACCU-CHEK AVIVA) device Use as instructed daily. 10/07/16  Yes Arnoldo Morale, MD  clotrimazole (LOTRIMIN) 1 % cream APPLY 1 APPLICATION TOPICALLY 2 TIMES DAILY 11/12/16  Yes Arnoldo Morale, MD  gabapentin (NEURONTIN) 300 MG capsule Take 1 capsule (300 mg total) by mouth 3 (three) times daily. 10/07/16  Yes Arnoldo Morale, MD  glucose blood (ACCU-CHEK AVIVA PLUS) test strip Use to test blood sugar 3 times daily as instructed. Dx code: E11.319 10/07/16  Yes Arnoldo Morale, MD  insulin aspart (NOVOLOG FLEXPEN) 100 UNIT/ML FlexPen 0-12 units three times daily as per sliding scale. 10/07/16  Yes Arnoldo Morale, MD  Insulin Glargine (LANTUS SOLOSTAR) 100 UNIT/ML Solostar Pen Inject 35 Units into the skin at bedtime. 10/07/16  Yes Arnoldo Morale, MD  Insulin Pen Needle (BD PEN NEEDLE NANO U/F) 32G X 4 MM MISC Use to inject insulin 4 times daily. 11/08/16  Yes Arnoldo Morale, MD  lisinopril (PRINIVIL,ZESTRIL) 5 MG tablet Take 1 tablet (5 mg  total) by mouth daily. 10/07/16  Yes Arnoldo Morale, MD  loperamide (IMODIUM A-D) 2 MG tablet Take 1 tablet (2 mg total) by mouth 4 (four) times daily as needed for diarrhea or loose stools. 11/17/16  Yes Jessica D Zehr, PA-C  metFORMIN (GLUCOPHAGE) 1000 MG tablet Take 1 tablet (1,000 mg total) by mouth 2 (two) times daily with a meal. 10/07/16  Yes Arnoldo Morale, MD  nitroGLYCERIN (NITROSTAT) 0.4 MG SL tablet Place 0.4 mg under the tongue every 5 (five)  minutes as needed for chest pain.   Yes Historical Provider, MD  Glecaprevir-Pibrentasvir (MAVYRET) 100-40 MG TABS Take 3 tablets by mouth daily. 11/18/16   Thayer Headings, MD    No Known Allergies  Social History  Substance Use Topics  . Smoking status: Never Smoker  . Smokeless tobacco: Never Used  . Alcohol use No    Family History  Problem Relation Age of Onset  . Colon cancer Mother   . Other Neg Hx   sister with hepatitis C and possible liver disease associated with it   Objective:  Constitutional: in no apparent distress,  Vitals:   11/18/16 0950  BP: 135/85  Pulse: 91  Temp: 97.9 F (36.6 C)   Eyes: anicteric Cardiovascular: Cor RRR Respiratory: CTA B; normal respiratory effort Gastrointestinal: Bowel sounds are normal, obese, soft, nt Musculoskeletal: pedal edema 1 + Skin: negatives: no rash; no porphyria cutanea tarda Lymphatic: no cervical lymphadenopathy   Laboratory Genotype:  Lab Results  Component Value Date   HCVGENOTYPE Comment 11/08/2016   HCV viral load: No results found for: HCVQUANT Lab Results  Component Value Date   WBC 4.8 08/31/2016   HGB 11.3 (L) 08/31/2016   HCT 35.3 (L) 08/31/2016   MCV 83.3 08/31/2016   PLT 217 08/31/2016    Lab Results  Component Value Date   CREATININE 0.66 11/08/2016   BUN 7 11/08/2016   NA 139 11/08/2016   K 4.0 11/08/2016   CL 100 11/08/2016   CO2 26 11/08/2016    Lab Results  Component Value Date   ALT 15 11/08/2016   AST 14 11/08/2016   ALKPHOS 66 11/08/2016     Labs and history reviewed and show CHILD-PUGH A  5-6 points: Child class A 7-9 points: Child class B 10-15 points: Child class C  Lab Results  Component Value Date   INR 1.00 12/27/2009   BILITOT 0.4 11/08/2016   ALBUMIN 3.8 11/08/2016     Assessment: New Patient with Chronic Hepatitis C genotype 1b, untreated.  I discussed with the patient the lab findings that confirm chronic hepatitis C as well as the natural history and  progression of disease including about 30% of people who develop cirrhosis of the liver if left untreated and once cirrhosis is established there is a 2-7% risk per year of liver cancer and liver failure.  I discussed the importance of treatment and benefits in reducing the risk, even if significant liver fibrosis exists.   Plan: 1) Patient counseled extensively on limiting acetaminophen to no more than 2 grams daily, avoidance of alcohol. 2) Transmission discussed with patient including sexual transmission, sharing razors and toothbrush.   3) Will need referral to gastroenterology if concern for cirrhosis 4) Will need referral for substance abuse counseling: No.; Further work up to include urine drug screen  No. 5) Will prescribe Mayvret for 8 weeks 12 weeks 6) Hepatitis A and B titers 8) Pneumovax vaccine today 9) Further work up to include liver staging  with elastography 10) will follow up after starting medication

## 2016-11-18 NOTE — Patient Instructions (Signed)
Date 11/18/16  Dear [name] [name], As discussed in the [facility], your hepatitis C therapy will include the following medications:          Mavyret (glecaprevir 100 mg/pibrentasvir 40 mg): Take 3 tablets by mouth once daily for 8 or 12 weeks ---------------------------------------------------------------- Your HCV Treatment Start Date: TBA   Your HCV genotype:  1b    Liver Fibrosis: TBD    ---------------------------------------------------------------- YOUR PHARMACY CONTACT:   [facility] [address] [place], [place] [address] Phone: [phone] Hours: Monday to Friday 7:30 am to 6:00 pm   Please always contact your pharmacy at least 3-4 business days before you run out of medications to ensure your next month's medication is ready or 1 week prior to running out if you receive it by mail.  Remember, each prescription is for 28 days. ---------------------------------------------------------------- GENERAL NOTES REGARDING YOUR HEPATITIS C MEDICATION:  Mavyret: - tablets are pink, oblong shape - take 3 tablets daily with food. - The tablets should be stored at room temperature.  - Acid reducing agents such as H2 blockers (ie. Pepcid (famotidine), Zantac (ranitidine), Tagamet (cimetidine), Axid (nizatidine) and proton pump inhibitors (ie. Prilosec (omeprazole), Protonix (pantoprazole), Nexium (esomeprazole), or Aciphex (rabeprazole)) can decrease effectiveness of Harvoni. Do not take until you have discussed with a health care provider.    -Antacids that contain magnesium and/or aluminum hydroxide (ie. Milk of Magensia, Rolaids, Gaviscon, Maalox, Mylanta, an dArthritis Pain Formula) can reduce absorption of Harvoni, so take them at least 4 hours before or after Harvoni.  -Calcium carbonate (calcium supplements or antacids such as Tums, Caltrate, Os-Cal) needs to be taken at least 4 hours hours before or after Harvoni.  -St. John's wort or any products that contain  St. John's wort like some herbal supplements  Please inform the office prior to starting any of these medications.  - The common side effects associated with Mavyret include:      1. Fatigue      2. Headache      3. Nausea      4. Diarrhea      5. Insomnia  Please note that this only lists the most common side effects and is NOT a comprehensive list of the potential side effects of these medications. For more information, please review the drug information sheets that come with your medication package from the pharmacy.  ---------------------------------------------------------------- GENERAL HELPFUL HINTS ON HCV THERAPY: 1. Stay well-hydrated. 2. Notify the [facility] of any changes in your other over-the-counter/herbal or prescription medications. 3. If you miss a dose of your medication, take the missed dose as soon as you remember. Return to your regular time/dose schedule the next day.  4.  Do not stop taking your medications without first talking with your healthcare provider. 5.  You may take Tylenol (acetaminophen), as long as the dose is less than 2000 mg (OR no more than 4 tablets of the Tylenol Extra Strengths 500mg  tablet) in 24 hours. 6.  You will see our pharmacist-specialist within the first 2 weeks of starting your medication to monitor for any possible side effects. 7.  You will have labs once during treatment, after soon after treatment completion and one final lab 6 months after treatment completion to verify the virus is out of your system.  Scharlene Gloss, [name], [name]  [phone]

## 2016-11-19 LAB — HEPATITIS B CORE ANTIBODY, TOTAL: HEP B C TOTAL AB: REACTIVE — AB

## 2016-11-19 LAB — HEPATITIS A ANTIBODY, TOTAL: HEP A TOTAL AB: REACTIVE — AB

## 2016-11-19 LAB — HEPATITIS B SURFACE ANTIBODY,QUALITATIVE: HEP B S AB: NEGATIVE

## 2016-11-19 LAB — HEPATITIS B SURFACE ANTIGEN: Hepatitis B Surface Ag: NEGATIVE

## 2016-11-20 LAB — HCV RNA QUANT RFLX ULTRA OR GENOTYP
HCV Quant Baseline: 719000 [IU]/mL
HCV log10: 5.857 {Log_IU}/mL

## 2016-11-20 LAB — HEPATITIS C GENOTYPE

## 2016-11-20 LAB — HEPATITIS C RNA QUANTITATIVE
HCV Quantitative Log: 5.57 Log IU/mL — ABNORMAL HIGH
HCV Quantitative: 368000 IU/mL — ABNORMAL HIGH

## 2016-11-20 LAB — SPECIMEN STATUS REPORT

## 2016-11-22 ENCOUNTER — Telehealth: Payer: Self-pay | Admitting: Obstetrics and Gynecology

## 2016-11-22 ENCOUNTER — Encounter: Payer: Self-pay | Admitting: Obstetrics and Gynecology

## 2016-11-22 MED ORDER — PROGESTERONE MICRONIZED 100 MG PO CAPS: 100.0000 mg | ORAL_CAPSULE | Freq: Every day | ORAL | 3 refills | Status: DC

## 2016-11-22 MED ORDER — ESTRADIOL 10 MCG VA TABS: ORAL_TABLET | VAGINAL | 3 refills | Status: DC

## 2016-11-22 NOTE — Telephone Encounter (Signed)
GYN Telephone Note  Patient called at [phone] to go over results. She is almost done with the flagyl for BV; d/w her negative u/s results. Also d/w her re: r/b/a to trying vaginal HRT namely, HTN, VTE, malignancy risks, which are very minimal; she'd like to do a trial. D/w her to call us for any AUB s/s and will send in basket request for rpt visit in 87m.   Durene Romans MD Attending Center for [employer] (Faculty Practice) 11/22/2016 Time: 1225pm

## 2016-11-23 ENCOUNTER — Telehealth: Payer: Self-pay | Admitting: Obstetrics and Gynecology

## 2016-11-23 ENCOUNTER — Other Ambulatory Visit: Payer: Self-pay | Admitting: Internal Medicine

## 2016-11-23 LAB — LIVER FIBROSIS, FIBROTEST-ACTITEST
ALPHA-2-MACROGLOBULIN: 421 mg/dL — AB (ref 106–279)
ALT: 15 U/L (ref 6–29)
Apolipoprotein A1: 216 mg/dL — ABNORMAL HIGH (ref 101–198)
BILIRUBIN: 0.4 mg/dL (ref 0.2–1.2)
FIBROSIS SCORE: 0.21
GGT: 31 U/L (ref 3–70)
Haptoglobin: 252 mg/dL — ABNORMAL HIGH (ref 43–212)
Necroinflammat ACT Score: 0.04
REFERENCE ID: 1934543

## 2016-11-23 NOTE — Telephone Encounter (Signed)
Per doctor Ilda Basset, patient is to follow up in August with him. Informed patient to call us in July to schedule in August due to schedule not being available at this time.

## 2016-11-26 ENCOUNTER — Encounter: Payer: Self-pay | Admitting: Pharmacist

## 2016-11-26 ENCOUNTER — Encounter: Payer: Self-pay | Admitting: Pharmacy Technician

## 2016-11-26 MED FILL — MAVYRET 100-40 MG TABS: 100-40 | 28 days supply | Qty: 84 | Fill #0

## 2016-11-26 NOTE — Progress Notes (Signed)
[name] [name] was approved for 2 months of Mavyret for her Hepatitis C. I counseled her over the phone on how to take it, side effects, and went over her other medications with her.  She is on Lipitor 20 mg for high cholesterol which is contraindicated with Mavyret.  Told her to hold Lipitor x 2 months while taking Mavyret (she does not have h/o stroke or heart attack). She verbalized understanding and will hold Lipitor.

## 2016-11-30 ENCOUNTER — Ambulatory Visit: Payer: Medicaid Other

## 2016-12-02 ENCOUNTER — Ambulatory Visit (HOSPITAL_COMMUNITY)
Admission: RE | Admit: 2016-12-02 | Discharge: 2016-12-02 | Disposition: A | Discharge status: Home / Self Care | Payer: Medicaid Other | Source: Ambulatory Visit | Attending: Internal Medicine | Admitting: Internal Medicine

## 2016-12-02 ENCOUNTER — Other Ambulatory Visit: Payer: Self-pay | Admitting: Internal Medicine

## 2016-12-02 DIAGNOSIS — K8020 Calculus of gallbladder without cholecystitis without obstruction: Secondary | ICD-10-CM | POA: Diagnosis not present

## 2016-12-02 DIAGNOSIS — B182 Chronic viral hepatitis C: Secondary | ICD-10-CM | POA: Diagnosis present

## 2016-12-02 DIAGNOSIS — N281 Cyst of kidney, acquired: Secondary | ICD-10-CM | POA: Diagnosis not present

## 2016-12-10 ENCOUNTER — Ambulatory Visit
Admission: RE | Admit: 2016-12-10 | Discharge: 2016-12-10 | Disposition: A | Discharge status: Home / Self Care | Payer: Medicaid Other | Source: Ambulatory Visit | Attending: Obstetrics and Gynecology | Admitting: Obstetrics and Gynecology

## 2016-12-10 DIAGNOSIS — Z1231 Encounter for screening mammogram for malignant neoplasm of breast: Secondary | ICD-10-CM | POA: Diagnosis not present

## 2016-12-10 DIAGNOSIS — Z1239 Encounter for other screening for malignant neoplasm of breast: Secondary | ICD-10-CM

## 2016-12-15 ENCOUNTER — Other Ambulatory Visit: Payer: Self-pay | Admitting: Obstetrics and Gynecology

## 2016-12-15 DIAGNOSIS — R928 Other abnormal and inconclusive findings on diagnostic imaging of breast: Secondary | ICD-10-CM

## 2016-12-16 ENCOUNTER — Ambulatory Visit: Payer: Medicaid Other

## 2016-12-17 ENCOUNTER — Ambulatory Visit
Admission: RE | Admit: 2016-12-17 | Discharge: 2016-12-17 | Disposition: A | Discharge status: Home / Self Care | Payer: Medicaid Other | Source: Ambulatory Visit | Attending: Obstetrics and Gynecology | Admitting: Obstetrics and Gynecology

## 2016-12-17 ENCOUNTER — Other Ambulatory Visit: Payer: Self-pay | Admitting: Obstetrics and Gynecology

## 2016-12-17 ENCOUNTER — Ambulatory Visit (INDEPENDENT_AMBULATORY_CARE_PROVIDER_SITE_OTHER): Payer: Medicaid Other | Admitting: Pharmacist

## 2016-12-17 DIAGNOSIS — B182 Chronic viral hepatitis C: Secondary | ICD-10-CM | POA: Diagnosis present

## 2016-12-17 DIAGNOSIS — R921 Mammographic calcification found on diagnostic imaging of breast: Secondary | ICD-10-CM

## 2016-12-17 DIAGNOSIS — R928 Other abnormal and inconclusive findings on diagnostic imaging of breast: Secondary | ICD-10-CM

## 2016-12-17 NOTE — Patient Instructions (Addendum)
Please start your Mavyret today Friday December 17, 2016.  Make sure to take all three pills at the same time with food (lunch).  Do not miss any doses!  Please stop taking your Lipitor (atorvastatin) while on this medication.  We will see you for a follow-up on Tuesday, June 19 at 10am.  Please call [phone] with any questions/issues.

## 2016-12-17 NOTE — Progress Notes (Signed)
HPI: Ashantae Pangallo is a 56 y.o. female who presents to the [facility] clinic for Hep C follow-up.  She has genotype 1b and F3/F4 liver fibrosis.   Lab Results  Component Value Date   HCVGENOTYPE Comment 11/08/2016   HEPCGENOTYPE CANCELED 11/08/2016   HEPCGENOTYPE 1b 11/08/2016    Allergies: No Known Allergies  Past Medical History: Past Medical History:  Diagnosis Date  . Anemia   . Chronic hepatitis C without hepatic coma ([place]) 11/09/2016  . Diabetes mellitus   . Fibroids   . 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 Hypertension   . MRSA (methicillin resistant Staphylococcus aureus)   . Trichomonas   . VAGINITIS, BACTERIAL, RECURRENT 08/15/2007   Qualifier: Diagnosis of  By: Radene Ou MD, [place]      Social History: Social History   Social History  . Marital status: Legally Separated    Spouse name: N/A  . Number of children: N/A  . Years of education: N/A   Social History Main Topics  . Smoking status: Never Smoker  . Smokeless tobacco: Never Used  . Alcohol use No  . Drug use: No     Comment: 8 years ago for the cocaine  1 year ago with marijuana  . Sexual activity: Yes    Birth control/ protection: None   Other Topics Concern  . Not on file   Social History Narrative  . No narrative on file    Labs: Hep B S Ab (no units)  Date Value  11/18/2016 NEG   Hepatitis B Surface Ag (no units)  Date Value  11/18/2016 NEGATIVE    Lab Results  Component Value Date   HCVGENOTYPE Comment 11/08/2016   HEPCGENOTYPE CANCELED 11/08/2016   HEPCGENOTYPE 1b 11/08/2016    Hepatitis C RNA quantitative Latest Ref Rng & Units 11/18/2016  HCV Quantitative NOT DETECTED IU/mL 368,000(H)  HCV Quantitative Log NOT DETECTED Log IU/mL 5.57(H)    AST  Date Value  11/08/2016 14 IU/L  06/16/2016 19 U/L  09/24/2015 26 U/L   ALT  Date Value  11/18/2016 15 U/L  11/08/2016 15 IU/L  06/16/2016 16 U/L  09/24/2015 20 U/L   INR (no units)   Date Value  11/18/2016 1.1  12/27/2009 1.00  08/25/2009 1.10    CrCl: CrCl cannot be calculated (Patient's most recent lab result is older than the maximum 21 days allowed.).  Fibrosis Score: F3/F4 as assessed by ARFI   Child-Pugh Score: A  Previous Treatment Regimen: None  Assessment: Tonianne is here today for Hep C follow-up.  She was supposed to start on [place] on 5/14 when she talked to [place] and received the medication. She tells me today that she has not started yet.  She was very overwhelmed and said she was expecting someone to call her and tell her to start.  I told her we had her down at starting a few weeks ago and I even talked to her about stopping her Lipitor. She doesn't remember.  We spent a lot of time going over her readiness to start taking the medication.  Emphasized the extreme importance of consistently taking it once she started.  Explained her labs to her, including her fibrosis score, and her viral load.  She said she was ready at this time and will start today.  I told her to take all 3 tablets with food at one time.  Do not separate the tablets. I also circled her Lipitor  on her AVS and told her to stop taking. She will need labs to get the 3rd month extension for Medicaid.  She will come back and see me in 2-3 weeks.    Plans: - Start Mavyret x 12 weeks today - F/u with me for adherence and labs 6/19 at [place]. [place], PharmD, [facility] for Infectious Disease 12/17/2016, 1:55 PM

## 2016-12-21 ENCOUNTER — Ambulatory Visit
Admission: RE | Admit: 2016-12-21 | Discharge: 2016-12-21 | Disposition: A | Discharge status: Home / Self Care | Payer: Medicaid Other | Source: Ambulatory Visit | Attending: Obstetrics and Gynecology | Admitting: Obstetrics and Gynecology

## 2016-12-21 DIAGNOSIS — R921 Mammographic calcification found on diagnostic imaging of breast: Secondary | ICD-10-CM

## 2016-12-22 MED FILL — MAVYRET 100-40 MG TABS: 100-40 | 28 days supply | Qty: 84 | Fill #1

## 2016-12-28 ENCOUNTER — Encounter: Payer: Self-pay | Admitting: Pharmacy Technician

## 2017-01-03 ENCOUNTER — Telehealth: Payer: Self-pay | Admitting: Family Medicine

## 2017-01-03 DIAGNOSIS — Z794 Long term (current) use of insulin: Secondary | ICD-10-CM

## 2017-01-03 DIAGNOSIS — I10 Essential (primary) hypertension: Secondary | ICD-10-CM

## 2017-01-03 DIAGNOSIS — E1149 Type 2 diabetes mellitus with other diabetic neurological complication: Secondary | ICD-10-CM

## 2017-01-03 MED ORDER — LISINOPRIL 5 MG PO TABS: 5.0000 mg | ORAL_TABLET | Freq: Every day | ORAL | 3 refills | Status: DC

## 2017-01-03 MED ORDER — METFORMIN HCL 1000 MG PO TABS: 1000.0000 mg | ORAL_TABLET | Freq: Two times a day (BID) | ORAL | 3 refills | Status: DC

## 2017-01-03 NOTE — Telephone Encounter (Signed)
Requested medications sent to [facility]

## 2017-01-03 NOTE — Telephone Encounter (Signed)
Pt came to request refill of metformin and lisinopril be sent to [facility] at [facility]. Please f/u.

## 2017-01-04 ENCOUNTER — Ambulatory Visit (INDEPENDENT_AMBULATORY_CARE_PROVIDER_SITE_OTHER): Payer: Medicaid Other | Admitting: Pharmacist

## 2017-01-04 DIAGNOSIS — B182 Chronic viral hepatitis C: Secondary | ICD-10-CM | POA: Diagnosis not present

## 2017-01-04 MED ORDER — GLECAPREVIR-PIBRENTASVIR 100-40 MG PO TABS: 3.0000 | ORAL_TABLET | Freq: Every day | ORAL | 0 refills | Status: DC

## 2017-01-04 NOTE — Progress Notes (Signed)
HPI: [name] [name] is a 56 y.o. female who presents to the [facility] clinic for Hep C follow-up. She has genotype 1b, F3/F4, and started 3 months of Mavyret on 6/1.   Lab Results  Component Value Date   HCVGENOTYPE Comment 11/08/2016   HEPCGENOTYPE CANCELED 11/08/2016   HEPCGENOTYPE 1b 11/08/2016    Allergies: No Known Allergies  Past Medical History: Past Medical History:  Diagnosis Date  . Anemia   . Chronic hepatitis C without hepatic coma ([place]) 11/09/2016  . Diabetes mellitus   . Fibroids   . 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 Hypertension   . MRSA (methicillin resistant Staphylococcus aureus)   . Trichomonas   . VAGINITIS, BACTERIAL, RECURRENT 08/15/2007   Qualifier: Diagnosis of  By: Radene Ou MD, [place]      Social History: Social History   Social History  . Marital status: Legally Separated    Spouse name: N/A  . Number of children: N/A  . Years of education: N/A   Social History Main Topics  . Smoking status: Never Smoker  . Smokeless tobacco: Never Used  . Alcohol use No  . Drug use: No     Comment: 8 years ago for the cocaine  1 year ago with marijuana  . Sexual activity: Yes    Birth control/ protection: None   Other Topics Concern  . Not on file   Social History Narrative  . No narrative on file    Labs: Hep B S Ab (no units)  Date Value  11/18/2016 NEG   Hepatitis B Surface Ag (no units)  Date Value  11/18/2016 NEGATIVE    Lab Results  Component Value Date   HCVGENOTYPE Comment 11/08/2016   HEPCGENOTYPE CANCELED 11/08/2016   HEPCGENOTYPE 1b 11/08/2016    Hepatitis C RNA quantitative Latest Ref Rng & Units 11/18/2016  HCV Quantitative NOT DETECTED IU/mL 368,000(H)  HCV Quantitative Log NOT DETECTED Log IU/mL 5.57(H)    AST  Date Value  11/08/2016 14 IU/L  06/16/2016 19 U/L  09/24/2015 26 U/L   ALT  Date Value  11/18/2016 15 U/L  11/08/2016 15 IU/L  06/16/2016 16 U/L  09/24/2015 20 U/L    INR (no units)  Date Value  11/18/2016 1.1  12/27/2009 1.00  08/25/2009 1.10    CrCl: CrCl cannot be calculated (Patient's most recent lab result is older than the maximum 21 days allowed.).  Fibrosis Score: F3/F4 as assessed by ARFI   Child-Pugh Score: A  Previous Treatment Regimen: None  Assessment: [name] [name] is here today to follow-up for her Hep C.  She started Mavyret ~3 weeks ago on 6/1.  She takes all three pills together in the morning with food. She has not missed any doses and is not having any side effects.  She does complain of a headache one in awhile.  She is excited to finally be starting.  I asked her if she stopped her Lipitor and she was confused again.  Explained to do that and she said she had, so hopefully she did.  Told her to watch out for myalgias if not.  Told her again to stop taking it.  We will get labs today for Medicaid extension for her 3rd month.    Plans: - Continue Mavyret x 12 weeks - Hep C VL today - F/u with Dr. Linus Salmons at [facility] 9/4 at [place]. Madoc Holquin, PharmD, [facility] for  Infectious Disease 01/04/2017, 3:08 PM

## 2017-01-08 LAB — HEPATITIS C RNA QUANTITATIVE
HCV Quantitative Log: <1.18 Log IU/mL — AB
HCV Quantitative: <15 IU/mL — AB

## 2017-01-10 ENCOUNTER — Ambulatory Visit (INDEPENDENT_AMBULATORY_CARE_PROVIDER_SITE_OTHER): Payer: Medicaid Other | Admitting: Pharmacist

## 2017-01-10 DIAGNOSIS — B182 Chronic viral hepatitis C: Secondary | ICD-10-CM

## 2017-01-10 NOTE — Progress Notes (Signed)
HPI: [name] [name] is a 56 y.o. female who presents to the [facility] clinic today as a walk in.   Lab Results  Component Value Date   HCVGENOTYPE Comment 11/08/2016   HEPCGENOTYPE CANCELED 11/08/2016   HEPCGENOTYPE 1b 11/08/2016    Allergies: No Known Allergies  Past Medical History: Past Medical History:  Diagnosis Date  . Anemia   . Chronic hepatitis C without hepatic coma ([place]) 11/09/2016  . Diabetes mellitus   . Fibroids   . 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 Hypertension   . MRSA (methicillin resistant Staphylococcus aureus)   . Trichomonas   . VAGINITIS, BACTERIAL, RECURRENT 08/15/2007   Qualifier: Diagnosis of  By: Radene Ou MD, [place]      Social History: Social History   Social History  . Marital status: Legally Separated    Spouse name: N/A  . Number of children: N/A  . Years of education: N/A   Social History Main Topics  . Smoking status: Never Smoker  . Smokeless tobacco: Never Used  . Alcohol use No  . Drug use: No     Comment: 8 years ago for the cocaine  1 year ago with marijuana  . Sexual activity: Yes    Birth control/ protection: None   Other Topics Concern  . Not on file   Social History Narrative  . No narrative on file    Labs: Hep B S Ab (no units)  Date Value  11/18/2016 NEG   Hepatitis B Surface Ag (no units)  Date Value  11/18/2016 NEGATIVE    Lab Results  Component Value Date   HCVGENOTYPE Comment 11/08/2016   HEPCGENOTYPE CANCELED 11/08/2016   HEPCGENOTYPE 1b 11/08/2016    Hepatitis C RNA quantitative Latest Ref Rng & Units 01/04/2017 11/18/2016  HCV Quantitative NOT DETECTED IU/mL <15 DETECTED(A) 368,000(H)  HCV Quantitative Log NOT DETECTED Log IU/mL <1.18 DETECTED(A) 5.57(H)    AST  Date Value  11/08/2016 14 IU/L  06/16/2016 19 U/L  09/24/2015 26 U/L   ALT  Date Value  11/18/2016 15 U/L  11/08/2016 15 IU/L  06/16/2016 16 U/L  09/24/2015 20 U/L   INR (no units)   Date Value  11/18/2016 1.1  12/27/2009 1.00  08/25/2009 1.10    CrCl: CrCl cannot be calculated (Patient's most recent lab result is older than the maximum 21 days allowed.).  Fibrosis Score: F3/F4 as assessed by ARFI   Child-Pugh Score: A  Previous Treatment Regimen: None  Assessment: [name] [name] walked into clinic today to see what her lab work was last time we checked a week ago.  She saw me for follow-up of her Hep C last week.  She started 12 weeks of Mavyret on June 1st. Her early on treatment viral load went from 368,000 to <15.  Relayed this information to her.  She was very happy.  She will follow-up with Dr. Linus Salmons at [facility].    Plans: - Continue Mavyret x 12 weeks - F/u with Dr. Linus Salmons 9/4 at [place]. Orlanda Frankum, PharmD, [place] for Infectious Disease 01/10/2017, 4:28 PM

## 2017-01-13 MED FILL — MAVYRET 100-40 MG TABS: 100-40 | 28 days supply | Qty: 84 | Fill #0

## 2017-01-18 ENCOUNTER — Emergency Department (HOSPITAL_COMMUNITY): Payer: Medicaid Other

## 2017-01-18 ENCOUNTER — Encounter (HOSPITAL_COMMUNITY): Payer: Self-pay | Admitting: Emergency Medicine

## 2017-01-18 DIAGNOSIS — Z79899 Other long term (current) drug therapy: Secondary | ICD-10-CM | POA: Insufficient documentation

## 2017-01-18 DIAGNOSIS — I10 Essential (primary) hypertension: Secondary | ICD-10-CM | POA: Insufficient documentation

## 2017-01-18 DIAGNOSIS — E119 Type 2 diabetes mellitus without complications: Secondary | ICD-10-CM | POA: Diagnosis not present

## 2017-01-18 DIAGNOSIS — R05 Cough: Secondary | ICD-10-CM | POA: Insufficient documentation

## 2017-01-18 DIAGNOSIS — J40 Bronchitis, not specified as acute or chronic: Secondary | ICD-10-CM | POA: Insufficient documentation

## 2017-01-18 DIAGNOSIS — Z7984 Long term (current) use of oral hypoglycemic drugs: Secondary | ICD-10-CM | POA: Diagnosis not present

## 2017-01-18 DIAGNOSIS — Z794 Long term (current) use of insulin: Secondary | ICD-10-CM | POA: Diagnosis not present

## 2017-01-18 DIAGNOSIS — R079 Chest pain, unspecified: Secondary | ICD-10-CM | POA: Diagnosis present

## 2017-01-18 DIAGNOSIS — R0602 Shortness of breath: Secondary | ICD-10-CM | POA: Diagnosis not present

## 2017-01-18 LAB — BASIC METABOLIC PANEL
ANION GAP: 8 (ref 5–15)
BUN: 9 mg/dL (ref 6–20)
CHLORIDE: 106 mmol/L (ref 101–111)
CO2: 25 mmol/L (ref 22–32)
Calcium: 8.6 mg/dL — ABNORMAL LOW (ref 8.9–10.3)
Creatinine, Ser: 0.7 mg/dL (ref 0.44–1.00)
Glucose, Bld: 204 mg/dL — ABNORMAL HIGH (ref 65–99)
POTASSIUM: 3.4 mmol/L — AB (ref 3.5–5.1)
SODIUM: 139 mmol/L (ref 135–145)

## 2017-01-18 LAB — CBC
HEMATOCRIT: 32.2 % — AB (ref 36.0–46.0)
Hemoglobin: 10.1 g/dL — ABNORMAL LOW (ref 12.0–15.0)
MCH: 26.4 pg (ref 26.0–34.0)
MCHC: 31.4 g/dL (ref 30.0–36.0)
MCV: 84.3 fL (ref 78.0–100.0)
PLATELETS: 226 10*3/uL (ref 150–400)
RBC: 3.82 MIL/uL — AB (ref 3.87–5.11)
RDW: 13.9 % (ref 11.5–15.5)
WBC: 5.2 10*3/uL (ref 4.0–10.5)

## 2017-01-18 LAB — I-STAT TROPONIN, ED: Troponin i, poc: 0.02 ng/mL (ref 0.00–0.08)

## 2017-01-18 NOTE — ED Triage Notes (Addendum)
Pt reports generalized chest pain for two weeks accompanied by a cough.  Pt has not been able to sleep and is feeling poorly.  Pt has bilateral swelling in her feet and ankles.

## 2017-01-19 ENCOUNTER — Emergency Department (HOSPITAL_COMMUNITY)
Admission: EM | Admit: 2017-01-19 | Discharge: 2017-01-19 | Disposition: A | Discharge status: Home / Self Care | Payer: Medicaid Other | Attending: Emergency Medicine | Admitting: Emergency Medicine

## 2017-01-19 DIAGNOSIS — J40 Bronchitis, not specified as acute or chronic: Secondary | ICD-10-CM

## 2017-01-19 LAB — I-STAT TROPONIN, ED: Troponin i, poc: 0.01 ng/mL (ref 0.00–0.08)

## 2017-01-19 MED ORDER — BENZONATATE 100 MG PO CAPS: 100.0000 mg | ORAL_CAPSULE | Freq: Three times a day (TID) | ORAL | 0 refills | Status: DC

## 2017-01-19 MED ORDER — ALBUTEROL SULFATE HFA 108 (90 BASE) MCG/ACT IN AERS
2.0000 | INHALATION_SPRAY | RESPIRATORY_TRACT | Status: DC | PRN
  Administered 2017-01-19: 2 via RESPIRATORY_TRACT
  Filled 2017-01-19: qty 6.7

## 2017-01-19 MED ORDER — AZITHROMYCIN 250 MG PO TABS: 250.0000 mg | ORAL_TABLET | Freq: Every day | ORAL | 0 refills | Status: DC

## 2017-01-19 MED ORDER — ALBUTEROL SULFATE (2.5 MG/3ML) 0.083% IN NEBU
2.5000 mg | INHALATION_SOLUTION | Freq: Once | RESPIRATORY_TRACT | Status: AC
  Administered 2017-01-19: 2.5 mg via RESPIRATORY_TRACT
  Filled 2017-01-19: qty 3

## 2017-01-19 MED ORDER — HYDROCODONE-HOMATROPINE 5-1.5 MG/5ML PO SYRP: 5.0000 mL | ORAL_SOLUTION | Freq: Four times a day (QID) | ORAL | 0 refills | Status: DC | PRN

## 2017-01-19 NOTE — ED Provider Notes (Signed)
[facility] DEPT Provider Note   CSN: [id number] Arrival date & time: 01/18/17  2230     History   Chief Complaint Chief Complaint  Patient presents with  . Chest Pain  . Cough    HPI [name] [name] is a 56 y.o. female.  Patient presents to the emergency department for evaluation of chest pain. Patient reports that she has been experiencing pain in her chest associated with a cough that has been ongoing for 2 weeks. She has felt short of breath and has had some feeling like her feet and ankles are swelling. She does not have any known coronary artery disease. Pain is not continuous but only occurs when she coughs. She has had a nonproductive cough that has not developed any fever.      Past Medical History:  Diagnosis Date  . Anemia   . Chronic hepatitis C without hepatic coma ([place]) 11/09/2016  . Diabetes mellitus   . Fibroids   . 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 Hypertension   . MRSA (methicillin resistant Staphylococcus aureus)   . Trichomonas   . VAGINITIS, BACTERIAL, RECURRENT 08/15/2007   Qualifier: Diagnosis of  By: Radene Ou MD, [place]      Patient Active Problem List   Diagnosis Date Noted  . Diarrhea 11/17/2016  . Family history of colon cancer in mother 11/17/2016  . Dyspareunia in female 11/11/2016  . Screen for colon cancer 11/11/2016  . History of ovarian cyst 11/11/2016  . Chronic hepatitis C without hepatic coma ([place]) 11/09/2016  . Hyperlipidemia 10/07/2016  . Type 2 diabetes mellitus ([place]) 01/29/2014  . BREAST PAIN, BILATERAL 02/19/2010  . ANEMIA, IRON DEFICIENCY 10/03/2009  . LEUKOPENIA, MILD 09/19/2009  . CHEST PAIN UNSPECIFIED 09/19/2009  . LIPOMA 06/18/2009  . 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 TINEA PEDIS 05/07/2009  . SKIN LESION 05/07/2009  . COUGH 05/07/2009  . URI 04/04/2007  . SUBSTANCE ABUSE, MULTIPLE 02/22/2007  . Essential hypertension 02/22/2007    Past Surgical History:    Procedure Laterality Date  . CESAREAN SECTION     breech    OB History    Gravida Para Term Preterm AB Living   1 1 1     1    SAB TAB Ectopic Multiple Live Births           1       Home Medications    Prior to Admission medications   Medication Sig Start Date End Date Taking? Authorizing Provider  ACCU-CHEK SOFTCLIX LANCETS lancets Use to test blood sugar 3 times daily as instructed. Dx code: 250.50 10/07/16   Arnoldo Morale, MD  albuterol (PROVENTIL HFA;VENTOLIN HFA) 108 (90 Base) MCG/ACT inhaler Inhale 2 puffs into the lungs every 6 (six) hours as needed for wheezing or shortness of breath. 10/07/16   Arnoldo Morale, MD  amLODipine (NORVASC) 10 MG tablet Take 1 tablet (10 mg total) by mouth daily. 10/07/16   Arnoldo Morale, MD  atorvastatin (LIPITOR) 20 MG tablet Take 1 tablet (20 mg total) by mouth daily. 10/07/16   Arnoldo Morale, MD  azithromycin (ZITHROMAX) 250 MG tablet Take 1 tablet (250 mg total) by mouth daily. Take first 2 tablets together, then 1 every day until finished. 01/19/17   Orpah Greek, MD  benzonatate (TESSALON) 100 MG capsule Take 1 capsule (100 mg total) by mouth every 8 (eight) hours. 01/19/17   Orpah Greek, MD  Blood Glucose Monitoring Suppl (ACCU-CHEK AVIVA)  device Use as instructed daily. 10/07/16   Arnoldo Morale, MD  clotrimazole (LOTRIMIN) 1 % cream APPLY 1 APPLICATION TOPICALLY 2 TIMES DAILY 11/12/16   Arnoldo Morale, MD  Estradiol 10 MCG TABS vaginal tablet 62mcg per vagina daily x 2 weeks and then 2 times per week thereafter. 11/22/16   Aletha Halim, MD  gabapentin (NEURONTIN) 300 MG capsule Take 1 capsule (300 mg total) by mouth 3 (three) times daily. 10/07/16   Arnoldo Morale, MD  Glecaprevir-Pibrentasvir (MAVYRET) 100-40 MG TABS Take 3 tablets by mouth daily. 11/18/16   Thayer Headings, MD  Glecaprevir-Pibrentasvir (MAVYRET) 100-40 MG TABS Take 3 tablets by mouth daily with breakfast. 01/04/17   Comer, Okey Regal, MD  glucose blood (ACCU-CHEK  AVIVA PLUS) test strip Use to test blood sugar 3 times daily as instructed. Dx code: E11.319 10/07/16   Arnoldo Morale, MD  HYDROcodone-homatropine (HYCODAN) 5-1.5 MG/5ML syrup Take 5 mLs by mouth every 6 (six) hours as needed for cough. 01/19/17   Orpah Greek, MD  insulin aspart (NOVOLOG FLEXPEN) 100 UNIT/ML FlexPen 0-12 units three times daily as per sliding scale. 10/07/16   Arnoldo Morale, MD  Insulin Glargine (LANTUS SOLOSTAR) 100 UNIT/ML Solostar Pen Inject 35 Units into the skin at bedtime. 10/07/16   Arnoldo Morale, MD  Insulin Pen Needle (BD PEN NEEDLE NANO U/F) 32G X 4 MM MISC Use to inject insulin 4 times daily. 11/08/16   Arnoldo Morale, MD  lisinopril (PRINIVIL,ZESTRIL) 5 MG tablet Take 1 tablet (5 mg total) by mouth daily. 01/03/17   Arnoldo Morale, MD  loperamide (IMODIUM A-D) 2 MG tablet Take 1 tablet (2 mg total) by mouth 4 (four) times daily as needed for diarrhea or loose stools. 11/17/16   Zehr, Laban Emperor, PA-C  metFORMIN (GLUCOPHAGE) 1000 MG tablet Take 1 tablet (1,000 mg total) by mouth 2 (two) times daily with a meal. 01/03/17   Arnoldo Morale, MD  nitroGLYCERIN (NITROSTAT) 0.4 MG SL tablet Place 0.4 mg under the tongue every 5 (five) minutes as needed for chest pain.    [provider]  progesterone (PROMETRIUM) 100 MG capsule Take 1 capsule (100 mg total) by mouth daily. 11/22/16   Aletha Halim, MD    Family History Family History  Problem Relation Age of Onset  . Colon cancer Mother   . Other Neg Hx   . Breast cancer Neg Hx     Social History Social History  Substance Use Topics  . Smoking status: Never Smoker  . Smokeless tobacco: Never Used  . Alcohol use No     Allergies   Patient has no known allergies.   Review of Systems Review of Systems  Respiratory: Positive for cough and shortness of breath.   Cardiovascular: Positive for chest pain and leg swelling.  All other systems reviewed and are negative.    Physical Exam Updated Vital  Signs BP (!) 159/86   Pulse 83   Temp 98.7 F (37.1 C) (Oral)   Resp 14   Ht 5\' 6"  (1.676 m)   Wt 97.5 kg (215 lb)   LMP 04/01/2010   SpO2 97%   BMI 34.70 kg/m   Physical Exam  Constitutional: She is oriented to person, place, and time. She appears well-developed and well-nourished. No distress.  HENT:  Head: Normocephalic and atraumatic.  Right Ear: Hearing normal.  Left Ear: Hearing normal.  Nose: Nose normal.  Mouth/Throat: Oropharynx is clear and moist and mucous membranes are normal.  Eyes: Conjunctivae and EOM are  normal. Pupils are equal, round, and reactive to light.  Neck: Normal range of motion. Neck supple.  Cardiovascular: Regular rhythm, S1 normal and S2 normal.  Exam reveals no gallop and no friction rub.   No murmur heard. Pulmonary/Chest: Effort normal. No respiratory distress. She has wheezes. She exhibits no tenderness.  Abdominal: Soft. Normal appearance and bowel sounds are normal. There is no hepatosplenomegaly. There is no tenderness. There is no rebound, no guarding, no tenderness at McBurney's point and negative Murphy's sign. No hernia.  Musculoskeletal: Normal range of motion.  Neurological: She is alert and oriented to person, place, and time. She has normal strength. No cranial nerve deficit or sensory deficit. Coordination normal. GCS eye subscore is 4. GCS verbal subscore is 5. GCS motor subscore is 6.  Skin: Skin is warm, dry and intact. No rash noted. No cyanosis.  Psychiatric: She has a normal mood and affect. Her speech is normal and behavior is normal. Thought content normal.  Nursing note and vitals reviewed.    ED Treatments / Results  Labs (all labs ordered are listed, but only abnormal results are displayed) Labs Reviewed  BASIC METABOLIC PANEL - Abnormal; Notable for the following:       Result Value   Potassium 3.4 (*)    Glucose, Bld 204 (*)    Calcium 8.6 (*)    All other components within normal limits  CBC - Abnormal; Notable  for the following:    RBC 3.82 (*)    Hemoglobin 10.1 (*)    HCT 32.2 (*)    All other components within normal limits  I-STAT TROPOININ, ED  I-STAT TROPOININ, ED    EKG  EKG Interpretation  Date/Time:  Tuesday January 18 2017 22:41:49 EDT Ventricular Rate:  89 PR Interval:  162 QRS Duration: 86 QT Interval:  386 QTC Calculation: 469 R Axis:     Text Interpretation:  Normal sinus rhythm Left ventricular hypertrophy Abnormal ECG No significant change since last tracing Confirmed by Orpah Greek [phone]) on 01/19/2017 5:37:09 AM       Radiology Dg Chest 2 View  Result Date: 01/19/2017 CLINICAL DATA:  Subacute onset of generalized chest pain and productive cough. Initial encounter. EXAM: CHEST  2 VIEW COMPARISON:  Chest radiograph performed 08/31/2016 FINDINGS: The lungs are well-aerated and clear. There is no evidence of focal opacification, pleural effusion or pneumothorax. The heart is normal in size; the mediastinal contour is within normal limits. No acute osseous abnormalities are seen. IMPRESSION: No acute cardiopulmonary process seen. Electronically Signed   By: Garald Balding M.D.   On: 01/19/2017 00:04    Procedures Procedures (including critical care time)  Medications Ordered in ED Medications  albuterol (PROVENTIL HFA;VENTOLIN HFA) 108 (90 Base) MCG/ACT inhaler 2 puff (not administered)  albuterol (PROVENTIL) (2.5 MG/3ML) 0.083% nebulizer solution 2.5 mg (2.5 mg Nebulization Given 01/19/17 0542)     Initial Impression / Assessment and Plan / ED Course  I have reviewed the triage vital signs and the nursing notes.  Pertinent labs & imaging results that were available during my care of the patient were reviewed by me and considered in my medical decision making (see chart for details).     Patient presents with cough and chest discomfort has been ongoing for 2 weeks. She does not have any hypoxia. EKG does not show any acute changes. Troponin negative 2.  Symptoms are very atypical, only present with cough. This is not felt to be consistent with cardiac etiology.  Patient did have some slight wheezing intermittently on examination, otherwise exam was unremarkable. No signs of congestive heart failure, despite having some swelling of her ankles intermittently. Patient exhibiting bronchitis with pain associated with her cough, we'll treat as an outpatient, follow-up with primary care doctor.  Final Clinical Impressions(s) / ED Diagnoses   Final diagnoses:  Bronchitis    New Prescriptions New Prescriptions   AZITHROMYCIN (ZITHROMAX) 250 MG TABLET    Take 1 tablet (250 mg total) by mouth daily. Take first 2 tablets together, then 1 every day until finished.   BENZONATATE (TESSALON) 100 MG CAPSULE    Take 1 capsule (100 mg total) by mouth every 8 (eight) hours.   HYDROCODONE-HOMATROPINE (HYCODAN) 5-1.5 MG/5ML SYRUP    Take 5 mLs by mouth every 6 (six) hours as needed for cough.     Orpah Greek, MD 01/19/17 [phone]

## 2017-01-28 ENCOUNTER — Telehealth: Payer: Self-pay | Admitting: Internal Medicine

## 2017-01-28 ENCOUNTER — Encounter: Payer: Medicaid Other | Admitting: Internal Medicine

## 2017-01-28 NOTE — Telephone Encounter (Signed)
Charge for no show? °

## 2017-01-31 ENCOUNTER — Telehealth: Payer: Self-pay | Admitting: *Deleted

## 2017-01-31 NOTE — Telephone Encounter (Signed)
  Follow up Call-  No flowsheet data found. [phone]  Patient questions:  Patient was a no show.

## 2017-01-31 NOTE — Telephone Encounter (Signed)
Unable to bill patient due to her Medicaid insurance. I did make a note in her chart that she did not call to cancel and was a NO SHOW.

## 2017-02-01 ENCOUNTER — Ambulatory Visit (AMBULATORY_SURGERY_CENTER): Payer: Medicaid Other | Admitting: Internal Medicine

## 2017-02-01 ENCOUNTER — Encounter: Payer: Self-pay | Admitting: Internal Medicine

## 2017-02-01 VITALS — BP 166/98 | HR 79 | Temp 97.5°F | Resp 14 | Ht 60.0 in | Wt 215.0 lb

## 2017-02-01 DIAGNOSIS — D128 Benign neoplasm of rectum: Secondary | ICD-10-CM

## 2017-02-01 DIAGNOSIS — K621 Rectal polyp: Secondary | ICD-10-CM

## 2017-02-01 DIAGNOSIS — D122 Benign neoplasm of ascending colon: Secondary | ICD-10-CM

## 2017-02-01 DIAGNOSIS — D123 Benign neoplasm of transverse colon: Secondary | ICD-10-CM | POA: Diagnosis not present

## 2017-02-01 DIAGNOSIS — Z1211 Encounter for screening for malignant neoplasm of colon: Secondary | ICD-10-CM

## 2017-02-01 DIAGNOSIS — Z86010 Personal history of colonic polyps: Secondary | ICD-10-CM | POA: Diagnosis not present

## 2017-02-01 DIAGNOSIS — R197 Diarrhea, unspecified: Secondary | ICD-10-CM | POA: Diagnosis not present

## 2017-02-01 DIAGNOSIS — Z800 Family history of malignant neoplasm of digestive organs: Secondary | ICD-10-CM

## 2017-02-01 MED ORDER — SODIUM CHLORIDE 0.9 % IV SOLN: 500.0000 mL | INTRAVENOUS | Status: AC

## 2017-02-01 NOTE — Patient Instructions (Signed)
YOU HAD AN ENDOSCOPIC PROCEDURE TODAY AT THE [facility]:   Refer to the procedure report that was given to you for any specific questions about what was found during the examination.  If the procedure report does not answer your questions, please call your gastroenterologist to clarify.  If you requested that your care partner not be given the details of your procedure findings, then the procedure report has been included in a sealed envelope for you to review at your convenience later.  YOU SHOULD EXPECT: Some feelings of bloating in the abdomen. Passage of more gas than usual.  Walking can help get rid of the air that was put into your GI tract during the procedure and reduce the bloating. If you had a lower endoscopy (such as a colonoscopy or flexible sigmoidoscopy) you may notice spotting of blood in your stool or on the toilet paper. If you underwent a bowel prep for your procedure, you may not have a normal bowel movement for a few days.  Please Note:  You might notice some irritation and congestion in your nose or some drainage.  This is from the oxygen used during your procedure.  There is no need for concern and it should clear up in a day or so.  SYMPTOMS TO REPORT IMMEDIATELY:   Following lower endoscopy (colonoscopy or flexible sigmoidoscopy):  Excessive amounts of blood in the stool  Significant tenderness or worsening of abdominal pains  Swelling of the abdomen that is new, acute  Fever of 100F or higher   For urgent or emergent issues, a gastroenterologist can be reached at any hour by calling [phone].   DIET:  We do recommend a small meal at first, but then you may proceed to your regular diet.  Drink plenty of fluids but you should avoid alcoholic beverages for 24 hours.  ACTIVITY:  You should plan to take it easy for the rest of today and you should NOT DRIVE or use heavy machinery until tomorrow (because of the sedation medicines used during the test).     FOLLOW UP: Our staff will call the number listed on your records the next business day following your procedure to check on you and address any questions or concerns that you may have regarding the information given to you following your procedure. If we do not reach you, we will leave a message.  However, if you are feeling well and you are not experiencing any problems, there is no need to return our call.  We will assume that you have returned to your regular daily activities without incident.  If any biopsies were taken you will be contacted by phone or by letter within the next 1-3 weeks.  Please call us at [phone] if you have not heard about the biopsies in 3 weeks.    SIGNATURES/CONFIDENTIALITY: You and/or your care partner have signed paperwork which will be entered into your electronic medical record.  These signatures attest to the fact that that the information above on your After Visit Summary has been reviewed and is understood.  Full responsibility of the confidentiality of this discharge information lies with you and/or your care-partner.  Polyp information given. 

## 2017-02-01 NOTE — Op Note (Signed)
[facility] Patient Name: [name] [name] [facility] Procedure Date: 02/01/2017 8:48 AM MRN: [id number] Endoscopist: Docia Chuck. Henrene Pastor , MD Age: 56 Referring MD:  Date of Birth: 02-Aug-1960 Gender: Female Account #: [id number] Procedure:                Colonoscopy with cold snare x 3 and biopsies Indications:              Screening in patient at increased risk: Colorectal                            cancer in mother before age 53. Incidental c/o                            diarrhea Medicines:                Monitored Anesthesia Care Procedure:                Pre-Anesthesia Assessment:                           - Prior to the procedure, a History and Physical                            was performed, and patient medications and                            allergies were reviewed. The patient's tolerance of                            previous anesthesia was also reviewed. The risks                            and benefits of the procedure and the sedation                            options and risks were discussed with the patient.                            All questions were answered, and informed consent                            was obtained. Prior Anticoagulants: The patient has                            taken no previous anticoagulant or antiplatelet                            agents. ASA Grade Assessment: II - A patient with                            mild systemic disease. After reviewing the risks                            and benefits, the patient was deemed in  satisfactory condition to undergo the procedure.                           After obtaining informed consent, the colonoscope                            was passed under direct vision. Throughout the                            procedure, the patient's blood pressure, pulse, and                            oxygen saturations were monitored continuously. The                            Colonoscope was  introduced through the anus and                            advanced to the the cecum, identified by                            appendiceal orifice and ileocecal valve. The                            ileocecal valve, appendiceal orifice, and rectum                            were photographed. The quality of the bowel                            preparation was excellent. The colonoscopy was                            performed without difficulty. The patient tolerated                            the procedure well. The bowel preparation used was                            SUPREP. Scope In: 8:55:18 AM Scope Out: 9:14:38 AM Scope Withdrawal Time: 0 hours 14 minutes 19 seconds  Total Procedure Duration: 0 hours 19 minutes 20 seconds  Findings:                 Three polyps were found in the rectum, transverse                            colon and ascending colon. The polyps were 5 to 10                            mm in size. These polyps were removed with a cold                            snare. Resection and retrieval were complete.  The entire examined colon appeared normal on direct                            and retroflexion views. Biopsies for histology were                            taken with a cold forceps from the entire colon for                            evaluation of microscopic colitis. Complications:            No immediate complications. Estimated blood loss:                            None. Estimated Blood Loss:     Estimated blood loss: none. Impression:               - Three 5 to 10 mm polyps in the rectum, in the                            transverse colon and in the ascending colon,                            removed with a cold snare. Resected and retrieved.                           - The entire examined colon is normal on direct and                            retroflexion views. Recommendation:           - Repeat colonoscopy in 3 years for  surveillance.                           - Patient has a contact number available for                            emergencies. The signs and symptoms of potential                            delayed complications were discussed with the                            patient. Return to normal activities tomorrow.                            Written discharge instructions were provided to the                            patient.                           - Resume previous diet.                           -  Continue present medications.                           - Await pathology results. Docia Chuck. Henrene Pastor, MD 02/01/2017 9:21:29 AM This report has been signed electronically.

## 2017-02-01 NOTE — Progress Notes (Signed)
Spontaneous respirations throughout. VSS. Resting comfortably. To PACU on room air. Report to  [employer].

## 2017-02-01 NOTE — Progress Notes (Signed)
Called to room to assist during endoscopic procedure.  Patient ID and intended procedure confirmed with present staff. Received instructions for my participation in the procedure from the performing physician.  

## 2017-02-01 NOTE — Progress Notes (Signed)
Pt's states no medical or surgical changes since previsit or office visit. 

## 2017-02-02 ENCOUNTER — Telehealth: Payer: Self-pay | Admitting: *Deleted

## 2017-02-02 NOTE — Telephone Encounter (Signed)
  Follow up Call-  Call back number 02/01/2017  Post procedure Call Back phone  # [phone]  Permission to leave phone message Yes  Some recent data might be hidden     Patient questions:  Do you have a fever, pain , or abdominal swelling? No. Pain Score  0 *  Have you tolerated food without any problems? Yes.    Have you been able to return to your normal activities? Yes.    Do you have any questions about your discharge instructions: Diet   No. Medications  No. Follow up visit  No.  Do you have questions or concerns about your Care? No.  Actions: * If pain score is 4 or above: No action needed, pain <4.

## 2017-02-02 NOTE — Telephone Encounter (Signed)
  Follow up Call-  Call back number 02/01/2017  Post procedure Call Back phone  # [phone]  Permission to leave phone message Yes  Some recent data might be hidden     Patient questions:  Message left to call us if necessary.

## 2017-02-07 ENCOUNTER — Telehealth: Payer: Self-pay | Admitting: Pharmacist Clinician (PhC)/ Clinical Pharmacy Specialist

## 2017-02-07 ENCOUNTER — Other Ambulatory Visit: Payer: Self-pay | Admitting: Pharmacist Clinician (PhC)/ Clinical Pharmacy Specialist

## 2017-02-07 ENCOUNTER — Encounter: Payer: Self-pay | Admitting: Internal Medicine

## 2017-02-07 NOTE — Telephone Encounter (Signed)
Pt walked in to see pharmacy because she stated that she needs her 3rd "box" of medication. [facility] pharmacy confirmed that 3 mo were shipped. She said that she got 3 boxes but have no idea where the 3rd box is at right now. I think she could get away with 8 wks if it comes down to it. [name] [name] will try to get her a 3rd replacement month through Medicaid.

## 2017-02-08 ENCOUNTER — Other Ambulatory Visit: Payer: Self-pay | Admitting: Pharmacist Clinician (PhC)/ Clinical Pharmacy Specialist

## 2017-02-08 MED ORDER — GLECAPREVIR-PIBRENTASVIR 100-40 MG PO TABS: 3.0000 | ORAL_TABLET | Freq: Every day | ORAL | 0 refills | Status: DC

## 2017-02-08 MED FILL — MAVYRET 100-40 MG TABS: 100-40 | 28 days supply | Qty: 84 | Fill #0

## 2017-02-09 ENCOUNTER — Ambulatory Visit: Payer: Medicaid Other | Attending: Family Medicine | Admitting: Family Medicine

## 2017-02-09 ENCOUNTER — Encounter: Payer: Self-pay | Admitting: Family Medicine

## 2017-02-09 VITALS — BP 160/100 | HR 80 | Temp 97.8°F | Ht 66.0 in | Wt 217.4 lb

## 2017-02-09 DIAGNOSIS — E11311 Type 2 diabetes mellitus with unspecified diabetic retinopathy with macular edema: Secondary | ICD-10-CM | POA: Diagnosis not present

## 2017-02-09 DIAGNOSIS — Z9889 Other specified postprocedural states: Secondary | ICD-10-CM | POA: Diagnosis not present

## 2017-02-09 DIAGNOSIS — B182 Chronic viral hepatitis C: Secondary | ICD-10-CM | POA: Diagnosis not present

## 2017-02-09 DIAGNOSIS — E1149 Type 2 diabetes mellitus with other diabetic neurological complication: Secondary | ICD-10-CM | POA: Diagnosis not present

## 2017-02-09 DIAGNOSIS — I10 Essential (primary) hypertension: Secondary | ICD-10-CM | POA: Diagnosis not present

## 2017-02-09 DIAGNOSIS — R600 Localized edema: Secondary | ICD-10-CM | POA: Insufficient documentation

## 2017-02-09 DIAGNOSIS — E1142 Type 2 diabetes mellitus with diabetic polyneuropathy: Secondary | ICD-10-CM | POA: Insufficient documentation

## 2017-02-09 DIAGNOSIS — Z794 Long term (current) use of insulin: Secondary | ICD-10-CM | POA: Diagnosis not present

## 2017-02-09 DIAGNOSIS — N941 Unspecified dyspareunia: Secondary | ICD-10-CM | POA: Diagnosis not present

## 2017-02-09 DIAGNOSIS — E7800 Pure hypercholesterolemia, unspecified: Secondary | ICD-10-CM | POA: Diagnosis not present

## 2017-02-09 DIAGNOSIS — Z79899 Other long term (current) drug therapy: Secondary | ICD-10-CM | POA: Insufficient documentation

## 2017-02-09 DIAGNOSIS — E876 Hypokalemia: Secondary | ICD-10-CM | POA: Insufficient documentation

## 2017-02-09 LAB — POCT GLYCOSYLATED HEMOGLOBIN (HGB A1C): HEMOGLOBIN A1C: 10.2

## 2017-02-09 LAB — GLUCOSE, POCT (MANUAL RESULT ENTRY): POC Glucose: 256 mg/dl — AB (ref 70–99)

## 2017-02-09 MED ORDER — METFORMIN HCL 1000 MG PO TABS: 1000.0000 mg | ORAL_TABLET | Freq: Two times a day (BID) | ORAL | 5 refills | Status: DC

## 2017-02-09 MED ORDER — LISINOPRIL-HYDROCHLOROTHIAZIDE 20-25 MG PO TABS: 1.0000 | ORAL_TABLET | Freq: Every day | ORAL | 5 refills | Status: DC

## 2017-02-09 MED ORDER — GABAPENTIN 300 MG PO CAPS: 300.0000 mg | ORAL_CAPSULE | Freq: Two times a day (BID) | ORAL | 5 refills | Status: DC

## 2017-02-09 MED ORDER — INSULIN GLARGINE 100 UNIT/ML SOLOSTAR PEN: 45.0000 [IU] | PEN_INJECTOR | Freq: Every day | SUBCUTANEOUS | 5 refills | Status: DC

## 2017-02-09 MED ORDER — INSULIN ASPART 100 UNIT/ML FLEXPEN: PEN_INJECTOR | SUBCUTANEOUS | 5 refills | Status: DC

## 2017-02-09 MED ORDER — ATORVASTATIN CALCIUM 20 MG PO TABS: 20.0000 mg | ORAL_TABLET | Freq: Every day | ORAL | 5 refills | Status: DC

## 2017-02-09 NOTE — Progress Notes (Signed)
Subjective:  Patient ID: [name] [name], female    DOB: 24-Oct-1960  Age: 56 y.o. MRN: [id number]  CC: Follow-up visit  HPI [name] [name] is a 56 year old female with a history of type 2 diabetes mellitus (A1c 10.2), diabetic neuropathy hypertension, diabetic retinopathy, hyperlipidemia, hepatitis C who presents today for follow-up visit.  She is currently taking Mavyret for her hepatitis C and is closely followed by infectious disease.  Since her last office visit she has been to see GYN for dyspareunia and was prescribed vaginal estradiol along with Prometrium however the patient does not seem to be taking any of this and still complains of dyspareunia. Reviewed GYN notes -  she was supposed to follow-up 3 months from her visit in may 2018 where she is unaware of this.  Her mammogram had revealed microcalcifications in her left breast; pathology of the biopsied lesion revealed fibroadenomatoid nodules with calcification of the left breast and an annual screening mammogram recommended. She underwent a colonoscopy in 01/2017 which resection of polyps, the pathology of which revealed tubular adenoma. Her Pap smear from 10/2016 was negative.  She complains of pedal edema which is less dependent but denies shortness of breath or chest pains. She is currently on disability and is requesting a letter to be turned into social security to this effect.  Labs reviewed; she had hypokalemia of 3.4, 3 weeks ago  Past Medical History:  Diagnosis Date  . Anemia   . Chronic hepatitis C without hepatic coma ([place]) 11/09/2016  . Diabetes mellitus   . Fibroids   . 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 Hypertension   . MRSA (methicillin resistant Staphylococcus aureus)   . Trichomonas   . VAGINITIS, BACTERIAL, RECURRENT 08/15/2007   Qualifier: Diagnosis of  By: Radene Ou MD, [place]      Past Surgical History:  Procedure Laterality Date  . CESAREAN SECTION     breech     No Known Allergies   Outpatient Medications Prior to Visit  Medication Sig Dispense Refill  . ACCU-CHEK SOFTCLIX LANCETS lancets Use to test blood sugar 3 times daily as instructed. Dx code: 250.50 100 each 11  . albuterol (PROVENTIL HFA;VENTOLIN HFA) 108 (90 Base) MCG/ACT inhaler Inhale 2 puffs into the lungs every 6 (six) hours as needed for wheezing or shortness of breath. 1 Inhaler 3  . Blood Glucose Monitoring Suppl (ACCU-CHEK AVIVA) device Use as instructed daily. 1 each 0  . Estradiol 10 MCG TABS vaginal tablet 78mcg per vagina daily x 2 weeks and then 2 times per week thereafter. 20 tablet 3  . Glecaprevir-Pibrentasvir (MAVYRET) 100-40 MG TABS Take 3 tablets by mouth daily with breakfast. 84 tablet 0  . glucose blood (ACCU-CHEK AVIVA PLUS) test strip Use to test blood sugar 3 times daily as instructed. Dx code: E11.319 100 each 11  . HYDROcodone-homatropine (HYCODAN) 5-1.5 MG/5ML syrup Take 5 mLs by mouth every 6 (six) hours as needed for cough. 75 mL 0  . Insulin Pen Needle (BD PEN NEEDLE NANO U/F) 32G X 4 MM MISC Use to inject insulin 4 times daily. 150 each 11  . nitroGLYCERIN (NITROSTAT) 0.4 MG SL tablet Place 0.4 mg under the tongue every 5 (five) minutes as needed for chest pain.    Marland Kitchen amLODipine (NORVASC) 10 MG tablet Take 1 tablet (10 mg total) by mouth daily. 30 tablet 3  . atorvastatin (LIPITOR) 20 MG tablet Take 1 tablet (20 mg total) by mouth  daily. 30 tablet 3  . gabapentin (NEURONTIN) 300 MG capsule Take 1 capsule (300 mg total) by mouth 3 (three) times daily. 90 capsule 3  . insulin aspart (NOVOLOG FLEXPEN) 100 UNIT/ML FlexPen 0-12 units three times daily as per sliding scale. 3 pen 3  . Insulin Glargine (LANTUS SOLOSTAR) 100 UNIT/ML Solostar Pen Inject 35 Units into the skin at bedtime. 5 pen 3  . lisinopril (PRINIVIL,ZESTRIL) 5 MG tablet Take 1 tablet (5 mg total) by mouth daily. 30 tablet 3  . metFORMIN (GLUCOPHAGE) 1000 MG tablet Take 1 tablet (1,000 mg total) by  mouth 2 (two) times daily with a meal. 60 tablet 3  . progesterone (PROMETRIUM) 100 MG capsule Take 1 capsule (100 mg total) by mouth daily. (Patient not taking: Reported on 02/01/2017) 30 capsule 3  . azithromycin (ZITHROMAX) 250 MG tablet Take 1 tablet (250 mg total) by mouth daily. Take first 2 tablets together, then 1 every day until finished. (Patient not taking: Reported on 02/01/2017) 6 tablet 0  . benzonatate (TESSALON) 100 MG capsule Take 1 capsule (100 mg total) by mouth every 8 (eight) hours. (Patient not taking: Reported on 02/01/2017) 21 capsule 0  . clotrimazole (LOTRIMIN) 1 % cream APPLY 1 APPLICATION TOPICALLY 2 TIMES DAILY (Patient not taking: Reported on 02/01/2017) 30 g 0  . loperamide (IMODIUM A-D) 2 MG tablet Take 1 tablet (2 mg total) by mouth 4 (four) times daily as needed for diarrhea or loose stools. (Patient not taking: Reported on 02/01/2017) 30 tablet 1   Facility-Administered Medications Prior to Visit  Medication Dose Route Frequency Provider Last Rate Last Dose  . 0.9 %  sodium chloride infusion  500 mL Intravenous Continuous Irene Shipper, MD        ROS Review of Systems  Constitutional: Negative for activity change, appetite change and fatigue.  HENT: Negative for congestion, sinus pressure and sore throat.   Eyes: Negative for visual disturbance.  Respiratory: Negative for cough, chest tightness, shortness of breath and wheezing.   Cardiovascular: Negative for chest pain and palpitations.  Gastrointestinal: Negative for abdominal distention, abdominal pain and constipation.  Endocrine: Negative for polydipsia.  Genitourinary: Positive for dyspareunia. Negative for dysuria and frequency.  Musculoskeletal: Negative for arthralgias and back pain.  Skin: Negative for rash.  Neurological: Negative for tremors, light-headedness and numbness.  Hematological: Does not bruise/bleed easily.  Psychiatric/Behavioral: Negative for agitation and behavioral problems.     Objective:  BP (!) 160/100   Pulse 80   Temp 97.8 F (36.6 C) (Oral)   Ht 5\' 6"  (1.676 m)   Wt 217 lb 6.4 oz (98.6 kg)   LMP 04/01/2010   SpO2 97%   BMI 35.09 kg/m   BP/Weight 02/09/2017 9/62/9528 10/18/3242  Systolic BP 010 272 536  Diastolic BP 644 98 86  Wt. (Lbs) 217.4 215 -  BMI 35.09 41.99 34.7     Physical Exam  Constitutional: She is oriented to person, place, and time. She appears well-developed and well-nourished.  Neck: No JVD present.  Cardiovascular: Normal rate, normal heart sounds and intact distal pulses.   No murmur heard. Pulmonary/Chest: Effort normal and breath sounds normal. She has no wheezes. She has no rales. She exhibits no tenderness.  Abdominal: Soft. Bowel sounds are normal. She exhibits no distension and no mass. There is no tenderness.  Musculoskeletal: Normal range of motion.  Neurological: She is alert and oriented to person, place, and time.  Skin: Skin is warm and dry.  Psychiatric: She  has a normal mood and affect.     Lab Results  Component Value Date   HGBA1C 10.2 02/09/2017    CMP Latest Ref Rng & Units 01/18/2017 11/18/2016 11/08/2016  Glucose 65 - 99 mg/dL 204(H) - 316(H)  BUN 6 - 20 mg/dL 9 - 7  Creatinine 0.44 - 1.00 mg/dL 0.70 - 0.66  Sodium 135 - 145 mmol/L 139 - 139  Potassium 3.5 - 5.1 mmol/L 3.4(L) - 4.0  Chloride 101 - 111 mmol/L 106 - 100  CO2 22 - 32 mmol/L 25 - 26  Calcium 8.9 - 10.3 mg/dL 8.6(L) - 9.0  Total Protein 6.0 - 8.5 g/dL - - 6.8  Total Bilirubin 0.0 - 1.2 mg/dL - - 0.4  Alkaline Phos 39 - 117 IU/L - - 66  AST 0 - 40 IU/L - - 14  ALT 6 - 29 U/L - 15 15    Lipid Panel     Component Value Date/Time   CHOL 172 11/08/2016 1427   TRIG 100 11/08/2016 1427   HDL 80 11/08/2016 1427   CHOLHDL 2.2 11/08/2016 1427   CHOLHDL 3 06/18/2014 0951   VLDL 19.6 06/18/2014 0951   LDLCALC 72 11/08/2016 1427    Assessment & Plan:   1. 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 Essential hypertension Uncontrolled due to running out of her antihypertensives Discontinue amlodipine due to pedal edema Lisinopril switched to lisinopril/HCTZ Low-sodium, DASH diet Emphasized compliance with medications - lisinopril-hydrochlorothiazide (PRINZIDE,ZESTORETIC) 20-25 MG tablet; Take 1 tablet by mouth daily.  Dispense: 30 tablet; Refill: 5  3. Type 2 diabetes mellitus with other neurologic complication, with long-term current use of insulin ([facility]) Neuropathy is controlled - Insulin Glargine (LANTUS SOLOSTAR) 100 UNIT/ML Solostar Pen; Inject 45 Units into the skin at bedtime.  Dispense: 5 pen; Refill: 5 - gabapentin (NEURONTIN) 300 MG capsule; Take 1 capsule (300 mg total) by mouth 2 (two) times daily.  Dispense: 60 capsule; Refill: 5 - metFORMIN (GLUCOPHAGE) 1000 MG tablet; Take 1 tablet (1,000 mg total) by mouth 2 (two) times daily with a meal.  Dispense: 60 tablet; Refill: 5  4. Pure hypercholesterolemia Controlled Low-cholesterol diet - atorvastatin (LIPITOR) 20 MG tablet; Take 1 tablet (20 mg total) by mouth daily.  Dispense: 30 tablet; Refill: 5  5. Dyspareunia in female Uncontrolled She was prescribed vaginal estrogen pills which she never picked up Encouraged to make appointment with GYN as she is due for follow-up  6. Chronic hepatitis C without hepatic coma ([facility]) Currently on Mavyret  Letter provided to patient as per her request  Meds ordered this encounter  Medications  . Insulin Glargine (LANTUS SOLOSTAR) 100 UNIT/ML Solostar Pen    Sig: Inject 45 Units into the skin at bedtime.    Dispense:  5 pen    Refill:  5  . gabapentin (NEURONTIN) 300 MG  capsule    Sig: Take 1 capsule (300 mg total) by mouth 2 (two) times daily.    Dispense:  60 capsule    Refill:  5  . atorvastatin (LIPITOR) 20 MG tablet    Sig: Take 1 tablet (20 mg total) by mouth daily.    Dispense:  30 tablet  Refill:  5  . metFORMIN (GLUCOPHAGE) 1000 MG tablet    Sig: Take 1 tablet (1,000 mg total) by mouth 2 (two) times daily with a meal.    Dispense:  60 tablet    Refill:  5  . insulin aspart (NOVOLOG FLEXPEN) 100 UNIT/ML FlexPen    Sig: 0-12 units three times daily as per sliding scale.    Dispense:  3 pen    Refill:  5  . lisinopril-hydrochlorothiazide (PRINZIDE,ZESTORETIC) 20-25 MG tablet    Sig: Take 1 tablet by mouth daily.    Dispense:  30 tablet    Refill:  5    Discontinue Amlodipine    Follow-up: Return in about 2 months (around 04/12/2017) for follow up of Diabetes mellitus.   This note has been created with Surveyor, quantity. Any transcriptional errors are unintentional.     Arnoldo Morale MD

## 2017-02-15 ENCOUNTER — Other Ambulatory Visit: Payer: Self-pay | Admitting: General Practice

## 2017-02-15 DIAGNOSIS — N941 Unspecified dyspareunia: Secondary | ICD-10-CM

## 2017-02-15 MED ORDER — ESTRADIOL 10 MCG VA TABS: ORAL_TABLET | VAGINAL | 3 refills | Status: DC

## 2017-03-01 ENCOUNTER — Ambulatory Visit: Payer: Medicaid Other | Attending: Family Medicine | Admitting: Family Medicine

## 2017-03-01 ENCOUNTER — Encounter: Payer: Self-pay | Admitting: Family Medicine

## 2017-03-01 VITALS — BP 156/98 | HR 95 | Temp 97.9°F | Ht 66.0 in | Wt 214.2 lb

## 2017-03-01 DIAGNOSIS — E785 Hyperlipidemia, unspecified: Secondary | ICD-10-CM | POA: Insufficient documentation

## 2017-03-01 DIAGNOSIS — Z79899 Other long term (current) drug therapy: Secondary | ICD-10-CM | POA: Insufficient documentation

## 2017-03-01 DIAGNOSIS — I10 Essential (primary) hypertension: Secondary | ICD-10-CM

## 2017-03-01 DIAGNOSIS — R059 Cough, unspecified: Secondary | ICD-10-CM

## 2017-03-01 DIAGNOSIS — R05 Cough: Secondary | ICD-10-CM | POA: Diagnosis not present

## 2017-03-01 DIAGNOSIS — Z794 Long term (current) use of insulin: Secondary | ICD-10-CM

## 2017-03-01 DIAGNOSIS — B182 Chronic viral hepatitis C: Secondary | ICD-10-CM | POA: Diagnosis not present

## 2017-03-01 DIAGNOSIS — E1140 Type 2 diabetes mellitus with diabetic neuropathy, unspecified: Secondary | ICD-10-CM | POA: Insufficient documentation

## 2017-03-01 DIAGNOSIS — E11311 Type 2 diabetes mellitus with unspecified diabetic retinopathy with macular edema: Secondary | ICD-10-CM | POA: Insufficient documentation

## 2017-03-01 LAB — GLUCOSE, POCT (MANUAL RESULT ENTRY): POC GLUCOSE: 418 mg/dL — AB (ref 70–99)

## 2017-03-01 MED ORDER — LISINOPRIL-HYDROCHLOROTHIAZIDE 20-12.5 MG PO TABS: 2.0000 | ORAL_TABLET | Freq: Every day | ORAL | 3 refills | Status: DC

## 2017-03-01 MED ORDER — HYDROCODONE-HOMATROPINE 5-1.5 MG/5ML PO SYRP: 5.0000 mL | ORAL_SOLUTION | Freq: Four times a day (QID) | ORAL | 0 refills | Status: DC | PRN

## 2017-03-01 NOTE — Patient Instructions (Signed)

## 2017-03-01 NOTE — Progress Notes (Signed)
Subjective:  Patient ID: [name] [name], female    DOB: [birth year]  Age: 56 y.o. MRN: [id number]  CC: Hypertension and Medication Refill   HPI Eileen Kangas is a 56 year old female with a history of type 2 diabetes mellitus (A1c 10.2), diabetic neuropathy hypertension, diabetic retinopathy, hyperlipidemia, hepatitis C who presents today with elevated blood pressure which prevented her from undergoing a dental procedure.  At her last visit amlodipine was discontinued due to pedal edema and she was commenced on lisinopril/hydrochlorothiazide with resolution of the edema.  She does have intermittent cough with occasional production of phlegm and is requesting a refill of a cough syrup on her med list which she received previously.  For her diabetes she remains on metformin, Lantus and NovoLog and endorses compliance.  Past Medical History:  Diagnosis Date  . Anemia   . Chronic hepatitis C without hepatic coma ([place]) 11/09/2016  . Diabetes mellitus   . Fibroids   . 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 Hypertension   . MRSA (methicillin resistant Staphylococcus aureus)   . Trichomonas   . VAGINITIS, BACTERIAL, RECURRENT 08/15/2007   Qualifier: Diagnosis of  By: Radene Ou MD, [place]      Past Surgical History:  Procedure Laterality Date  . CESAREAN SECTION     breech    No Known Allergies    Outpatient Medications Prior to Visit  Medication Sig Dispense Refill  . ACCU-CHEK SOFTCLIX LANCETS lancets Use to test blood sugar 3 times daily as instructed. Dx code: 250.50 100 each 11  . albuterol (PROVENTIL HFA;VENTOLIN HFA) 108 (90 Base) MCG/ACT inhaler Inhale 2 puffs into the lungs every 6 (six) hours as needed for wheezing or shortness of breath. 1 Inhaler 3  . atorvastatin (LIPITOR) 20 MG tablet Take 1 tablet (20 mg total) by mouth daily. 30 tablet 5  . Blood Glucose Monitoring Suppl (ACCU-CHEK AVIVA) device Use as instructed daily. 1 each 0  .  gabapentin (NEURONTIN) 300 MG capsule Take 1 capsule (300 mg total) by mouth 2 (two) times daily. 60 capsule 5  . Glecaprevir-Pibrentasvir (MAVYRET) 100-40 MG TABS Take 3 tablets by mouth daily with breakfast. 84 tablet 0  . glucose blood (ACCU-CHEK AVIVA PLUS) test strip Use to test blood sugar 3 times daily as instructed. Dx code: E11.319 100 each 11  . insulin aspart (NOVOLOG FLEXPEN) 100 UNIT/ML FlexPen 0-12 units three times daily as per sliding scale. 3 pen 5  . Insulin Glargine (LANTUS SOLOSTAR) 100 UNIT/ML Solostar Pen Inject 45 Units into the skin at bedtime. 5 pen 5  . Insulin Pen Needle (BD PEN NEEDLE NANO U/F) 32G X 4 MM MISC Use to inject insulin 4 times daily. 150 each 11  . metFORMIN (GLUCOPHAGE) 1000 MG tablet Take 1 tablet (1,000 mg total) by mouth 2 (two) times daily with a meal. 60 tablet 5  . nitroGLYCERIN (NITROSTAT) 0.4 MG SL tablet Place 0.4 mg under the tongue every 5 (five) minutes as needed for chest pain.    Marland Kitchen lisinopril-hydrochlorothiazide (PRINZIDE,ZESTORETIC) 20-25 MG tablet Take 1 tablet by mouth daily. 30 tablet 5  . Estradiol 10 MCG TABS vaginal tablet 76mcg per vagina daily x 2 weeks and then 2 times per week thereafter. (Patient not taking: Reported on 03/01/2017) 20 tablet 3  . progesterone (PROMETRIUM) 100 MG capsule Take 1 capsule (100 mg total) by mouth daily. (Patient not taking: Reported on 02/01/2017) 30 capsule 3  . HYDROcodone-homatropine (HYCODAN) 5-1.5 MG/5ML  syrup Take 5 mLs by mouth every 6 (six) hours as needed for cough. (Patient not taking: Reported on 03/01/2017) 75 mL 0   Facility-Administered Medications Prior to Visit  Medication Dose Route Frequency Provider Last Rate Last Dose  . 0.9 %  sodium chloride infusion  500 mL Intravenous Continuous Irene Shipper, MD        ROS Review of Systems  Constitutional: Negative for activity change, appetite change and fatigue.  HENT: Negative for congestion, sinus pressure and sore throat.   Eyes: Negative  for visual disturbance.  Respiratory: Positive for cough. Negative for chest tightness, shortness of breath and wheezing.   Cardiovascular: Negative for chest pain and palpitations.  Gastrointestinal: Negative for abdominal distention, abdominal pain and constipation.  Endocrine: Negative for polydipsia.  Genitourinary: Negative.  Negative for dysuria and frequency.  Musculoskeletal: Negative.  Negative for arthralgias and back pain.  Skin: Negative for rash.  Neurological: Negative for tremors, light-headedness and numbness.  Hematological: Does not bruise/bleed easily.  Psychiatric/Behavioral: Negative for agitation, behavioral problems and dysphoric mood.    Objective:  BP (!) 156/98   Pulse 95   Temp 97.9 F (36.6 C) (Oral)   Ht 5\' 6"  (1.676 m)   Wt 214 lb 3.2 oz (97.2 kg)   LMP 04/01/2010   SpO2 90%   BMI 34.57 kg/m   BP/Weight 03/01/2017 02/09/2017 4/58/0998  Systolic BP 338 250 539  Diastolic BP 98 767 98  Wt. (Lbs) 214.2 217.4 215  BMI 34.57 35.09 41.99      Physical Exam  Constitutional: She is oriented to person, place, and time. She appears well-developed and well-nourished.  Cardiovascular: Normal rate, normal heart sounds and intact distal pulses.   No murmur heard. Pulmonary/Chest: Effort normal and breath sounds normal. She has no wheezes. She has no rales. She exhibits no tenderness.  Abdominal: Soft. Bowel sounds are normal. She exhibits no distension and no mass. There is no tenderness.  Musculoskeletal: Normal range of motion.  Neurological: She is alert and oriented to person, place, and time.    Lab Results  Component Value Date   HGBA1C 10.2 02/09/2017    Assessment & Plan:   1. 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, with long-term current use of insulin, unspecified retinopathy severity ([place]) Uncontrolled with A1c of 10.2 Regimen had been adjusted at her last visit We'll review blood sugar log for  next visit - POCT glucose (manual entry)  2. Cough If symptoms persist we'll place on antihistamine - HYDROcodone-homatropine (HYCODAN) 5-1.5 MG/5ML syrup; Take 5 mLs by mouth every 6 (six) hours as needed for cough.  Dispense: 120 mL; Refill: 0  3. Essential hypertension Uncontrolled Increased dose of lisinopril/HCTZ Low-sodium diet - lisinopril-hydrochlorothiazide (ZESTORETIC) 20-12.5 MG tablet; Take 2 tablets by mouth daily.  Dispense: 60 tablet; Refill: 3   Meds ordered this encounter  Medications  . lisinopril-hydrochlorothiazide (ZESTORETIC) 20-12.5 MG tablet    Sig: Take 2 tablets by mouth daily.    Dispense:  60 tablet    Refill:  3    Discontinue previous dose  . HYDROcodone-homatropine (HYCODAN) 5-1.5 MG/5ML syrup    Sig: Take 5 mLs by mouth every 6 (six) hours as needed for cough.    Dispense:  120 mL    Refill:  0    Follow-up: Return for follow up of chronic medical conditions, keep previously scheduled appointment.   This note has been created with Museum/gallery curator and  smart Company secretary. Any transcriptional errors are unintentional.     Arnoldo Morale MD

## 2017-03-03 ENCOUNTER — Emergency Department (HOSPITAL_COMMUNITY)
Admission: EM | Admit: 2017-03-03 | Discharge: 2017-03-04 | Disposition: A | Discharge status: Home / Self Care | Payer: Medicaid Other | Attending: Emergency Medicine | Admitting: Emergency Medicine

## 2017-03-03 ENCOUNTER — Encounter (HOSPITAL_COMMUNITY): Payer: Self-pay | Admitting: Emergency Medicine

## 2017-03-03 DIAGNOSIS — Z794 Long term (current) use of insulin: Secondary | ICD-10-CM | POA: Insufficient documentation

## 2017-03-03 DIAGNOSIS — R55 Syncope and collapse: Secondary | ICD-10-CM | POA: Diagnosis not present

## 2017-03-03 DIAGNOSIS — E1165 Type 2 diabetes mellitus with hyperglycemia: Secondary | ICD-10-CM | POA: Diagnosis not present

## 2017-03-03 DIAGNOSIS — Z79899 Other long term (current) drug therapy: Secondary | ICD-10-CM | POA: Insufficient documentation

## 2017-03-03 DIAGNOSIS — E860 Dehydration: Secondary | ICD-10-CM | POA: Insufficient documentation

## 2017-03-03 DIAGNOSIS — D649 Anemia, unspecified: Secondary | ICD-10-CM | POA: Diagnosis not present

## 2017-03-03 DIAGNOSIS — I10 Essential (primary) hypertension: Secondary | ICD-10-CM | POA: Diagnosis not present

## 2017-03-03 LAB — BASIC METABOLIC PANEL WITH GFR
Anion gap: 5 (ref 5–15)
BUN: 26 mg/dL — ABNORMAL HIGH (ref 6–20)
CO2: 28 mmol/L (ref 22–32)
Calcium: 8.8 mg/dL — ABNORMAL LOW (ref 8.9–10.3)
Chloride: 100 mmol/L — ABNORMAL LOW (ref 101–111)
Creatinine, Ser: 1.03 mg/dL — ABNORMAL HIGH (ref 0.44–1.00)
GFR calc Af Amer: >60 mL/min
GFR calc non Af Amer: 60 mL/min — ABNORMAL LOW
Glucose, Bld: 396 mg/dL — ABNORMAL HIGH (ref 65–99)
Potassium: 4 mmol/L (ref 3.5–5.1)
Sodium: 133 mmol/L — ABNORMAL LOW (ref 135–145)

## 2017-03-03 LAB — CBG MONITORING, ED
Glucose-Capillary: 396 mg/dL — ABNORMAL HIGH (ref 65–99)
Glucose-Capillary: 371 mg/dL — ABNORMAL HIGH (ref 65–99)

## 2017-03-03 LAB — CBC
HEMATOCRIT: 34.5 % — AB (ref 36.0–46.0)
Hemoglobin: 11.4 g/dL — ABNORMAL LOW (ref 12.0–15.0)
MCH: 27.2 pg (ref 26.0–34.0)
MCHC: 33 g/dL (ref 30.0–36.0)
MCV: 82.3 fL (ref 78.0–100.0)
PLATELETS: 206 10*3/uL (ref 150–400)
RBC: 4.19 MIL/uL (ref 3.87–5.11)
RDW: 13.2 % (ref 11.5–15.5)
WBC: 5.8 10*3/uL (ref 4.0–10.5)

## 2017-03-03 MED ORDER — ACETAMINOPHEN 500 MG PO TABS: 1000.0000 mg | ORAL_TABLET | Freq: Once | ORAL | Status: DC

## 2017-03-03 MED ORDER — SODIUM CHLORIDE 0.9 % IV BOLUS (SEPSIS)
1000.0000 mL | Freq: Once | INTRAVENOUS | Status: AC
  Administered 2017-03-03: 1000 mL via INTRAVENOUS

## 2017-03-03 NOTE — ED Provider Notes (Signed)
[facility] DEPT Provider Note   CSN: [id number] Arrival date & time: 03/03/17  2025     History   Chief Complaint Chief Complaint  Patient presents with  . Loss of Consciousness  . Hyperglycemia    HPI [name] [name] is a 56 y.o. female.  56 yo F with a chief complaint of a syncopal event. The patient was shopping and [employer] and she stood up from her motorized cart to grab something and felt lightheaded and passed out. Now feels back to baseline but has a very mild start of a headache. Denies chest pain shortness breath neck pain. Denies abdominal pain or vomiting or diarrhea.   The history is provided by the patient.  Loss of Consciousness   This is a new problem. The current episode started yesterday. The problem occurs constantly. The problem has not changed since onset.There was no loss of consciousness. Associated symptoms include headaches. Pertinent negatives include chest pain, congestion, dizziness, fever, nausea, palpitations and vomiting. She has tried nothing for the symptoms. The treatment provided no relief.  Hyperglycemia  Associated symptoms: syncope   Associated symptoms: no chest pain, no dizziness, no dysuria, no fever, no nausea, no shortness of breath and no vomiting     Past Medical History:  Diagnosis Date  . Anemia   . Chronic hepatitis C without hepatic coma ([place]) 11/09/2016  . Diabetes mellitus   . Fibroids   . 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 Hypertension   . MRSA (methicillin resistant Staphylococcus aureus)   . Trichomonas   . VAGINITIS, BACTERIAL, RECURRENT 08/15/2007   Qualifier: Diagnosis of  By: Radene Ou MD, [place]      Patient Active Problem List   Diagnosis Date Noted  . Diarrhea 11/17/2016  . Family history of colon cancer in mother 11/17/2016  . Dyspareunia in female 11/11/2016  . Screen for colon cancer 11/11/2016  . History of ovarian cyst 11/11/2016  . Chronic hepatitis C without  hepatic coma ([place]) 11/09/2016  . Hyperlipidemia 10/07/2016  . Type 2 diabetes mellitus ([place]) 01/29/2014  . BREAST PAIN, BILATERAL 02/19/2010  . ANEMIA, IRON DEFICIENCY 10/03/2009  . LEUKOPENIA, MILD 09/19/2009  . CHEST PAIN UNSPECIFIED 09/19/2009  . LIPOMA 06/18/2009  . 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 TINEA PEDIS 05/07/2009  . SKIN LESION 05/07/2009  . COUGH 05/07/2009  . URI 04/04/2007  . SUBSTANCE ABUSE, MULTIPLE 02/22/2007  . Essential hypertension 02/22/2007    Past Surgical History:  Procedure Laterality Date  . CESAREAN SECTION     breech    OB History    Gravida Para Term Preterm AB Living   1 1 1     1    SAB TAB Ectopic Multiple Live Births           1       Home Medications    Prior to Admission medications   Medication Sig Start Date End Date Taking? Authorizing Provider  albuterol (PROVENTIL HFA;VENTOLIN HFA) 108 (90 Base) MCG/ACT inhaler Inhale 2 puffs into the lungs every 6 (six) hours as needed for wheezing or shortness of breath. 10/07/16  Yes Amao, Charlane Ferretti, MD  atorvastatin (LIPITOR) 20 MG tablet Take 1 tablet (20 mg total) by mouth daily. 02/09/17  Yes Arnoldo Morale, MD  gabapentin (NEURONTIN) 300 MG capsule Take 1 capsule (300 mg total) by mouth 2 (two) times daily. 02/09/17  Yes Arnoldo Morale, MD  Glecaprevir-Pibrentasvir (MAVYRET) 100-40 MG TABS Take 3 tablets  by mouth daily with breakfast. 02/08/17  Yes Comer, Okey Regal, MD  insulin aspart (NOVOLOG FLEXPEN) 100 UNIT/ML FlexPen 0-12 units three times daily as per sliding scale. 02/09/17  Yes Arnoldo Morale, MD  Insulin Glargine (LANTUS SOLOSTAR) 100 UNIT/ML Solostar Pen Inject 45 Units into the skin at bedtime. 02/09/17  Yes Arnoldo Morale, MD  lisinopril-hydrochlorothiazide (ZESTORETIC) 20-12.5 MG tablet Take 2 tablets by mouth daily. 03/01/17  Yes Arnoldo Morale, MD  metFORMIN (GLUCOPHAGE) 1000 MG tablet Take 1 tablet (1,000 mg total) by mouth 2 (two) times daily with a meal. 02/09/17  Yes Amao, Enobong,  MD  nitroGLYCERIN (NITROSTAT) 0.4 MG SL tablet Place 0.4 mg under the tongue every 5 (five) minutes as needed for chest pain.   Yes [provider]  ACCU-CHEK SOFTCLIX LANCETS lancets Use to test blood sugar 3 times daily as instructed. Dx code: 250.50 10/07/16   Arnoldo Morale, MD  Blood Glucose Monitoring Suppl (ACCU-CHEK AVIVA) device Use as instructed daily. 10/07/16   Arnoldo Morale, MD  Estradiol 10 MCG TABS vaginal tablet 28mcg per vagina daily x 2 weeks and then 2 times per week thereafter. Patient not taking: Reported on 03/01/2017 02/15/17   Aletha Halim, MD  glucose blood (ACCU-CHEK AVIVA PLUS) test strip Use to test blood sugar 3 times daily as instructed. Dx code: E11.319 10/07/16   Arnoldo Morale, MD  HYDROcodone-homatropine (HYCODAN) 5-1.5 MG/5ML syrup Take 5 mLs by mouth every 6 (six) hours as needed for cough. Patient not taking: Reported on 03/04/2017 03/01/17   Arnoldo Morale, MD  Insulin Pen Needle (BD PEN NEEDLE NANO U/F) 32G X 4 MM MISC Use to inject insulin 4 times daily. 11/08/16   Arnoldo Morale, MD  progesterone (PROMETRIUM) 100 MG capsule Take 1 capsule (100 mg total) by mouth daily. Patient not taking: Reported on 02/01/2017 11/22/16   Aletha Halim, MD    Family History Family History  Problem Relation Age of Onset  . Colon cancer Mother   . Other Neg Hx   . Breast cancer Neg Hx     Social History Social History  Substance Use Topics  . Smoking status: Never Smoker  . Smokeless tobacco: Never Used  . Alcohol use No     Allergies   Patient has no known allergies.   Review of Systems Review of Systems  Constitutional: Negative for chills and fever.  HENT: Negative for congestion and rhinorrhea.   Eyes: Negative for redness and visual disturbance.  Respiratory: Negative for shortness of breath and wheezing.   Cardiovascular: Positive for syncope. Negative for chest pain and palpitations.  Gastrointestinal: Negative for nausea and vomiting.    Genitourinary: Negative for dysuria and urgency.  Musculoskeletal: Negative for arthralgias and myalgias.  Skin: Negative for pallor and wound.  Neurological: Positive for syncope and headaches. Negative for dizziness.     Physical Exam Updated Vital Signs BP (!) 160/124   Pulse 82   Temp 98.2 F (36.8 C) (Oral)   Resp 16   Ht 5\' 6"  (1.676 m)   Wt 97.1 kg (214 lb)   LMP 04/01/2010   SpO2 96%   BMI 34.54 kg/m   Physical Exam  Constitutional: She is oriented to person, place, and time. She appears well-developed and well-nourished. No distress.  HENT:  Head: Normocephalic and atraumatic.  Eyes: Pupils are equal, round, and reactive to light. EOM are normal.  Neck: Normal range of motion. Neck supple.  Cardiovascular: Normal rate and regular rhythm.  Exam reveals no gallop and  no friction rub.   No murmur heard. Pulmonary/Chest: Effort normal. She has no wheezes. She has no rales.  Abdominal: Soft. She exhibits no distension and no mass. There is no tenderness. There is no guarding.  Musculoskeletal: She exhibits edema (2+ to BLE). She exhibits no tenderness.  Neurological: She is alert and oriented to person, place, and time.  Skin: Skin is warm and dry. She is not diaphoretic.  Psychiatric: She has a normal mood and affect. Her behavior is normal.  Nursing note and vitals reviewed.    ED Treatments / Results  Labs (all labs ordered are listed, but only abnormal results are displayed) Labs Reviewed  BASIC METABOLIC PANEL - Abnormal; Notable for the following:       Result Value   Sodium 133 (*)    Chloride 100 (*)    Glucose, Bld 396 (*)    BUN 26 (*)    Creatinine, Ser 1.03 (*)    Calcium 8.8 (*)    GFR calc non Af Amer 60 (*)    All other components within normal limits  CBC - Abnormal; Notable for the following:    Hemoglobin 11.4 (*)    HCT 34.5 (*)    All other components within normal limits  CBG MONITORING, ED - Abnormal; Notable for the following:     Glucose-Capillary 396 (*)    All other components within normal limits  CBG MONITORING, ED - Abnormal; Notable for the following:    Glucose-Capillary 371 (*)    All other components within normal limits  URINALYSIS, ROUTINE W REFLEX MICROSCOPIC    EKG  EKG Interpretation  Date/Time:  Thursday March 03 2017 21:28:34 EDT Ventricular Rate:  97 PR Interval:    QRS Duration: 121 QT Interval:  397 QTC Calculation: 505 R Axis:   3 Text Interpretation:  duplicate please delete Confirmed by Deno Etienne [phone]) on 03/03/2017 11:07:21 PM       Radiology No results found.  Procedures Procedures (including critical care time)  Medications Ordered in ED Medications  sodium chloride 0.9 % bolus 1,000 mL (not administered)  acetaminophen (TYLENOL) tablet 1,000 mg (not administered)     Initial Impression / Assessment and Plan / ED Course  I have reviewed the triage vital signs and the nursing notes.  Pertinent labs & imaging results that were available during my care of the patient were reviewed by me and considered in my medical decision making (see chart for details).     56 yo F With a chief complaint of a syncopal event. This happened just prior to arrival. Sounds orthostatic in nature. Patient has had difficulty controlling her blood sugars over the past couple days. We'll give a bag of fluids. Follow-up with her family physician. Discussed necessity of strict glucose control.  12:19 AM:  I have discussed the diagnosis/risks/treatment options with the patient and family and believe the pt to be eligible for discharge home to follow-up with PCP. We also discussed returning to the ED immediately if new or worsening sx occur. We discussed the sx which are most concerning (e.g., sudden worsening pain, fever, inability to tolerate by mouth) that necessitate immediate return. Medications administered to the patient during their visit and any new prescriptions provided to the patient are  listed below.  Medications given during this visit Medications  sodium chloride 0.9 % bolus 1,000 mL (not administered)  acetaminophen (TYLENOL) tablet 1,000 mg (not administered)     The patient appears reasonably screen and/or stabilized  for discharge and I doubt any other medical condition or other [facility] requiring further screening, evaluation, or treatment in the ED at this time prior to discharge.     Final Clinical Impressions(s) / ED Diagnoses   Final diagnoses:  Dehydration  Syncope and collapse    New Prescriptions New Prescriptions   No medications on file     Deno Etienne, DO 03/04/17 0019

## 2017-03-03 NOTE — ED Triage Notes (Signed)
Patient c/o  sudden onset of dizziness followed by a syncopal episode in [employer] today. Denies head injury. Denies headache and blurred vision. Denies dizziness, chest pan, and SOB at this time.

## 2017-03-04 NOTE — Discharge Instructions (Signed)
Eat and drink well for the next couple of days.  Discuss with your family doc your high blood sugars.  They may want to change your insulin regimen.

## 2017-03-18 ENCOUNTER — Ambulatory Visit (INDEPENDENT_AMBULATORY_CARE_PROVIDER_SITE_OTHER): Payer: Medicaid Other | Admitting: Obstetrics and Gynecology

## 2017-03-18 VITALS — BP 183/89 | HR 79 | Ht 66.0 in | Wt 211.9 lb

## 2017-03-18 DIAGNOSIS — N952 Postmenopausal atrophic vaginitis: Secondary | ICD-10-CM | POA: Diagnosis present

## 2017-03-18 MED ORDER — ESTRADIOL 2 MG VA RING: 2.0000 mg | VAGINAL_RING | VAGINAL | 12 refills | Status: DC

## 2017-03-18 NOTE — Progress Notes (Signed)
Obstetrics and Gynecology Return Patient Evaluation  Appointment Date: 03/18/2017  OBGYN Clinic: [facility]  Primary Care Provider: Arnoldo Morale  Chief Complaint: f/u dyspareunia  History of Present Illness: [name] [name] is a 56 y.o. African-American G1P1001 (Patient's last menstrual period was 04/01/2010.), seen for the above chief complaint. Her past medical history is significant for BMI 35, HTN, DM2, h/o STDs, fibroids, ovarian cysts, h/o HSV, h/o c-section  Patient seen on 4/26 for dyspareunia and diagnosed with GSM and after negative u/s, pt was amenable to vaginal HRT. She states she went to the pharmacy and they didn't have the prescription. She then states she came to clinic and told us but nothing happened from there.     She still states she has dyspareunia.   Review of Systems: as noted in the History of Present Illness.  Past Medical History:  Past Medical History:  Diagnosis Date  . Anemia   . Chronic hepatitis C without hepatic coma ([place]) 11/09/2016  . Diabetes mellitus   . Fibroids   . 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 Hypertension   . MRSA (methicillin resistant Staphylococcus aureus)   . Trichomonas   . VAGINITIS, BACTERIAL, RECURRENT 08/15/2007   Qualifier: Diagnosis of  By: Radene Ou MD, [place]      Past Surgical History:  Past Surgical History:  Procedure Laterality Date  . CESAREAN SECTION     breech    Past Obstetrical History:  OB History  Gravida Para Term Preterm AB Living  1 1 1     1   SAB TAB Ectopic Multiple Live Births          1    # Outcome Date GA Lbr Len/2nd Weight Sex Delivery Anes PTL Lv  1 Term 03/25/83 [redacted]w[redacted]d   F CS-LTranv EPI  LIV       Past Gynecological History: As per HPI. Periods: No Last pap: 2018, NILM/HPV neg History of STIs: Yes  HRT use: no   Social History:  Social History   Social History  . Marital status: Legally Separated    Spouse name: N/A   . Number of children: N/A  . Years of education: N/A   Occupational History  . Not on file.   Social History Main Topics  . Smoking status: Never Smoker  . Smokeless tobacco: Never Used  . Alcohol use No  . Drug use: No     Comment: 8 years ago for the cocaine  1 year ago with marijuana  . Sexual activity: Yes    Birth control/ protection: None   Other Topics Concern  . Not on file   Social History Narrative  . No narrative on file    Family History:  Family History  Problem Relation Age of Onset  . Colon cancer Mother   . Other Neg Hx   . Breast cancer Neg Hx    Health Maintenance:  Mammogram(s): Yes.   Date: 2018, repeat in one year Colonoscopy: Yes.   Date: 01/2017, repeat 3 years  Medications [name] [name] had no medications administered during this visit. Current Outpatient Prescriptions  Medication Sig Dispense Refill  . ACCU-CHEK SOFTCLIX LANCETS lancets Use to test blood sugar 3 times daily as instructed. Dx code: 250.50 100 each 11  . albuterol (PROVENTIL HFA;VENTOLIN HFA) 108 (90 Base) MCG/ACT inhaler Inhale 2 puffs into the lungs every 6 (six) hours as needed for wheezing or shortness of breath. 1 Inhaler  3  . atorvastatin (LIPITOR) 20 MG tablet Take 1 tablet (20 mg total) by mouth daily. 30 tablet 5  . Blood Glucose Monitoring Suppl (ACCU-CHEK AVIVA) device Use as instructed daily. 1 each 0  . gabapentin (NEURONTIN) 300 MG capsule Take 1 capsule (300 mg total) by mouth 2 (two) times daily. 60 capsule 5  . Glecaprevir-Pibrentasvir (MAVYRET) 100-40 MG TABS Take 3 tablets by mouth daily with breakfast. 84 tablet 0  . glucose blood (ACCU-CHEK AVIVA PLUS) test strip Use to test blood sugar 3 times daily as instructed. Dx code: E11.319 100 each 11  . HYDROcodone-homatropine (HYCODAN) 5-1.5 MG/5ML syrup Take 5 mLs by mouth every 6 (six) hours as needed for cough. 120 mL 0  . insulin aspart (NOVOLOG FLEXPEN) 100 UNIT/ML FlexPen 0-12 units three times daily as per  sliding scale. 3 pen 5  . Insulin Glargine (LANTUS SOLOSTAR) 100 UNIT/ML Solostar Pen Inject 45 Units into the skin at bedtime. 5 pen 5  . Insulin Pen Needle (BD PEN NEEDLE NANO U/F) 32G X 4 MM MISC Use to inject insulin 4 times daily. 150 each 11  . lisinopril-hydrochlorothiazide (ZESTORETIC) 20-12.5 MG tablet Take 2 tablets by mouth daily. 60 tablet 3  . metFORMIN (GLUCOPHAGE) 1000 MG tablet Take 1 tablet (1,000 mg total) by mouth 2 (two) times daily with a meal. 60 tablet 5  . nitroGLYCERIN (NITROSTAT) 0.4 MG SL tablet Place 0.4 mg under the tongue every 5 (five) minutes as needed for chest pain.     Current Facility-Administered Medications  Medication Dose Route Frequency Provider Last Rate Last Dose  . 0.9 %  sodium chloride infusion  500 mL Intravenous Continuous Irene Shipper, MD        Allergies Patient has no known allergies.   Physical Exam:  BP (!) 183/89   Pulse 79   Ht 5\' 6"  (1.676 m)   Wt 211 lb 14.4 oz (96.1 kg)   LMP 04/01/2010   BMI 34.20 kg/m  Body mass index is 34.2 kg/m.  General appearance: Well nourished, well developed female in no acute distress.   Laboratory: none  Radiology: none  Assessment: pt stable  Plan:  D/w her re: r/b/a vaginal HRT and she is amenable to this. Will do E-string and physical Rx given to patient and told to call us with any issues with filling it/costs.  RTC 80m   Durene Romans MD Attending Center [facility])

## 2017-03-22 ENCOUNTER — Ambulatory Visit: Payer: Medicaid Other | Admitting: Internal Medicine

## 2017-04-04 ENCOUNTER — Encounter (HOSPITAL_COMMUNITY): Payer: Self-pay | Admitting: *Deleted

## 2017-04-04 ENCOUNTER — Emergency Department (HOSPITAL_COMMUNITY)
Admission: EM | Admit: 2017-04-04 | Discharge: 2017-04-04 | Disposition: A | Discharge status: Home / Self Care | Payer: Medicaid Other | Attending: Emergency Medicine | Admitting: Emergency Medicine

## 2017-04-04 ENCOUNTER — Emergency Department (HOSPITAL_COMMUNITY): Payer: Medicaid Other

## 2017-04-04 DIAGNOSIS — Z794 Long term (current) use of insulin: Secondary | ICD-10-CM | POA: Insufficient documentation

## 2017-04-04 DIAGNOSIS — Z79899 Other long term (current) drug therapy: Secondary | ICD-10-CM | POA: Insufficient documentation

## 2017-04-04 DIAGNOSIS — E1165 Type 2 diabetes mellitus with hyperglycemia: Secondary | ICD-10-CM | POA: Insufficient documentation

## 2017-04-04 DIAGNOSIS — R51 Headache: Secondary | ICD-10-CM | POA: Diagnosis present

## 2017-04-04 DIAGNOSIS — I10 Essential (primary) hypertension: Secondary | ICD-10-CM | POA: Insufficient documentation

## 2017-04-04 DIAGNOSIS — E860 Dehydration: Secondary | ICD-10-CM | POA: Diagnosis not present

## 2017-04-04 DIAGNOSIS — R110 Nausea: Secondary | ICD-10-CM | POA: Diagnosis not present

## 2017-04-04 DIAGNOSIS — R739 Hyperglycemia, unspecified: Secondary | ICD-10-CM

## 2017-04-04 LAB — URINALYSIS, ROUTINE W REFLEX MICROSCOPIC
Bilirubin Urine: NEGATIVE
KETONES UR: NEGATIVE mg/dL
LEUKOCYTES UA: NEGATIVE
Nitrite: NEGATIVE
Specific Gravity, Urine: 1.02 (ref 1.005–1.030)
pH: 7 (ref 5.0–8.0)

## 2017-04-04 LAB — CBG MONITORING, ED
GLUCOSE-CAPILLARY: 413 mg/dL — AB (ref 65–99)
Glucose-Capillary: 436 mg/dL — ABNORMAL HIGH (ref 65–99)
Glucose-Capillary: 359 mg/dL — ABNORMAL HIGH (ref 65–99)

## 2017-04-04 LAB — BASIC METABOLIC PANEL
ANION GAP: 11 (ref 5–15)
BUN: 17 mg/dL (ref 6–20)
CHLORIDE: 95 mmol/L — AB (ref 101–111)
CO2: 26 mmol/L (ref 22–32)
CREATININE: 0.96 mg/dL (ref 0.44–1.00)
Calcium: 9.5 mg/dL (ref 8.9–10.3)
GFR calc Af Amer: >60 mL/min (ref >60)
GFR calc non Af Amer: >60 mL/min (ref >60)
Glucose, Bld: 464 mg/dL — ABNORMAL HIGH (ref 65–99)
POTASSIUM: 4.5 mmol/L (ref 3.5–5.1)
SODIUM: 132 mmol/L — AB (ref 135–145)

## 2017-04-04 LAB — I-STAT VENOUS BLOOD GAS, ED
Acid-Base Excess: 3 mmol/L — ABNORMAL HIGH (ref 0.0–2.0)
Bicarbonate: 30.3 mmol/L — ABNORMAL HIGH (ref 20.0–28.0)
O2 Saturation: 82 %
PCO2 VEN: 54.9 mmHg (ref 44.0–60.0)
TCO2: 32 mmol/L (ref 22–32)
pH, Ven: 7.35 (ref 7.250–7.430)
pO2, Ven: 49 mmHg — ABNORMAL HIGH (ref 32.0–45.0)

## 2017-04-04 LAB — I-STAT TROPONIN, ED: TROPONIN I, POC: 0.02 ng/mL (ref 0.00–0.08)

## 2017-04-04 LAB — CBC
HEMATOCRIT: 38.1 % (ref 36.0–46.0)
HEMOGLOBIN: 12.7 g/dL (ref 12.0–15.0)
MCH: 27.1 pg (ref 26.0–34.0)
MCHC: 33.3 g/dL (ref 30.0–36.0)
MCV: 81.4 fL (ref 78.0–100.0)
Platelets: 225 10*3/uL (ref 150–400)
RBC: 4.68 MIL/uL (ref 3.87–5.11)
RDW: 12.8 % (ref 11.5–15.5)
WBC: 5.9 10*3/uL (ref 4.0–10.5)

## 2017-04-04 MED ORDER — INSULIN ASPART 100 UNIT/ML ~~LOC~~ SOLN
5.0000 [IU] | Freq: Once | SUBCUTANEOUS | Status: AC
  Administered 2017-04-04: 5 [IU] via INTRAVENOUS
  Filled 2017-04-04: qty 1

## 2017-04-04 MED ORDER — LACTATED RINGERS IV BOLUS (SEPSIS)
1000.0000 mL | Freq: Once | INTRAVENOUS | Status: AC
  Administered 2017-04-04: 1000 mL via INTRAVENOUS

## 2017-04-04 MED ORDER — ONDANSETRON HCL 4 MG/2ML IJ SOLN
4.0000 mg | Freq: Once | INTRAMUSCULAR | Status: AC
  Administered 2017-04-04: 4 mg via INTRAVENOUS
  Filled 2017-04-04: qty 2

## 2017-04-04 MED ORDER — ACETAMINOPHEN 500 MG PO TABS
1000.0000 mg | ORAL_TABLET | Freq: Once | ORAL | Status: AC
  Administered 2017-04-04: 1000 mg via ORAL
  Filled 2017-04-04: qty 2

## 2017-04-04 MED ORDER — LISINOPRIL 20 MG PO TABS
20.0000 mg | ORAL_TABLET | Freq: Once | ORAL | Status: AC
  Administered 2017-04-04: 20 mg via ORAL
  Filled 2017-04-04: qty 1

## 2017-04-04 NOTE — ED Notes (Signed)
Patient taken to CT.

## 2017-04-04 NOTE — ED Triage Notes (Signed)
Pt is here with a bad headache since waking up this am and states she feels bad.  She continually states she feels bad and weak all over.  Pt is nauseated and tries to vomit.  Sugar 436 at triage.

## 2017-04-04 NOTE — ED Provider Notes (Signed)
[facility] DEPT Provider Note   CSN: [id number] Arrival date & time: 04/04/17  1558   History   Chief Complaint Chief Complaint  Patient presents with  . Headache  . Hyperglycemia    HPI [name] [name] is a 56 y.o. female.  This is a 56 year old female with PMH of T2 DM on insulin, HTN, poor medication compliance who presents from home with continued headache all day, nausea, general fatigue.  Patient denies any falls, she denies any blurry vision, focal weakness or decreased sensation. Patient states her headache did not wake her from sleep. Denies chest pain, dyspnea, abdominal pain.  Denies changes in bowel movements or urinary symptoms, denies fevers.   The history is provided by the patient and a friend.    Past Medical History:  Diagnosis Date  . Anemia   . Chronic hepatitis C without hepatic coma ([place]) 11/09/2016  . Diabetes mellitus   . Fibroids   . 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 Hypertension   . MRSA (methicillin resistant Staphylococcus aureus)   . Trichomonas   . VAGINITIS, BACTERIAL, RECURRENT 08/15/2007   Qualifier: Diagnosis of  By: Radene Ou MD, [place]      Patient Active Problem List   Diagnosis Date Noted  . Diarrhea 11/17/2016  . Family history of colon cancer in mother 11/17/2016  . Dyspareunia in female 11/11/2016  . Screen for colon cancer 11/11/2016  . History of ovarian cyst 11/11/2016  . Chronic hepatitis C without hepatic coma ([place]) 11/09/2016  . Hyperlipidemia 10/07/2016  . Type 2 diabetes mellitus ([place]) 01/29/2014  . BREAST PAIN, BILATERAL 02/19/2010  . ANEMIA, IRON DEFICIENCY 10/03/2009  . LEUKOPENIA, MILD 09/19/2009  . CHEST PAIN UNSPECIFIED 09/19/2009  . LIPOMA 06/18/2009  . 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 TINEA PEDIS 05/07/2009  . SKIN LESION 05/07/2009  . COUGH 05/07/2009  . URI 04/04/2007  . SUBSTANCE ABUSE, MULTIPLE 02/22/2007  . Essential hypertension 02/22/2007    Past  Surgical History:  Procedure Laterality Date  . CESAREAN SECTION     breech    OB History    Gravida Para Term Preterm AB Living   1 1 1     1    SAB TAB Ectopic Multiple Live Births           1       Home Medications    Prior to Admission medications   Medication Sig Start Date End Date Taking? Authorizing Provider  albuterol (PROVENTIL HFA;VENTOLIN HFA) 108 (90 Base) MCG/ACT inhaler Inhale 2 puffs into the lungs every 6 (six) hours as needed for wheezing or shortness of breath. 10/07/16  Yes Amao, Charlane Ferretti, MD  gabapentin (NEURONTIN) 300 MG capsule Take 1 capsule (300 mg total) by mouth 2 (two) times daily. 02/09/17  Yes Amao, Charlane Ferretti, MD  insulin aspart (NOVOLOG FLEXPEN) 100 UNIT/ML FlexPen 0-12 units three times daily as per sliding scale. 02/09/17  Yes Arnoldo Morale, MD  Insulin Glargine (LANTUS SOLOSTAR) 100 UNIT/ML Solostar Pen Inject 45 Units into the skin at bedtime. 02/09/17  Yes Arnoldo Morale, MD  lisinopril-hydrochlorothiazide (ZESTORETIC) 20-12.5 MG tablet Take 2 tablets by mouth daily. Patient taking differently: Take 1 tablet by mouth daily.  03/01/17  Yes Arnoldo Morale, MD  metFORMIN (GLUCOPHAGE) 1000 MG tablet Take 1 tablet (1,000 mg total) by mouth 2 (two) times daily with a meal. 02/09/17  Yes Amao, Enobong, MD  nitroGLYCERIN (NITROSTAT) 0.4 MG SL tablet Place 0.4 mg under the  tongue every 5 (five) minutes as needed for chest pain.   Yes [provider]  ACCU-CHEK SOFTCLIX LANCETS lancets Use to test blood sugar 3 times daily as instructed. Dx code: 250.50 10/07/16   Arnoldo Morale, MD  atorvastatin (LIPITOR) 20 MG tablet Take 1 tablet (20 mg total) by mouth daily. 02/09/17   Arnoldo Morale, MD  Blood Glucose Monitoring Suppl (ACCU-CHEK AVIVA) device Use as instructed daily. 10/07/16   Arnoldo Morale, MD  estradiol (ESTRING) 2 MG vaginal ring Place 2 mg vaginally every 3 (three) months. follow package directions 03/18/17   Aletha Halim, MD  Glecaprevir-Pibrentasvir  (MAVYRET) 100-40 MG TABS Take 3 tablets by mouth daily with breakfast. 02/08/17   Comer, Okey Regal, MD  glucose blood (ACCU-CHEK AVIVA PLUS) test strip Use to test blood sugar 3 times daily as instructed. Dx code: E11.319 10/07/16   Arnoldo Morale, MD  HYDROcodone-homatropine (HYCODAN) 5-1.5 MG/5ML syrup Take 5 mLs by mouth every 6 (six) hours as needed for cough. Patient not taking: Reported on 04/04/2017 03/01/17   Arnoldo Morale, MD  Insulin Pen Needle (BD PEN NEEDLE NANO U/F) 32G X 4 MM MISC Use to inject insulin 4 times daily. 11/08/16   Arnoldo Morale, MD  progesterone (PROMETRIUM) 100 MG capsule Take 1 capsule (100 mg total) by mouth daily. Patient not taking: Reported on 02/01/2017 11/22/16   Aletha Halim, MD    Family History Family History  Problem Relation Age of Onset  . Colon cancer Mother   . Other Neg Hx   . Breast cancer Neg Hx     Social History Social History  Substance Use Topics  . Smoking status: Never Smoker  . Smokeless tobacco: Never Used  . Alcohol use No     Allergies   Patient has no known allergies.   Review of Systems Review of Systems  Constitutional: Positive for activity change and fatigue. Negative for appetite change, chills, diaphoresis, fever and unexpected weight change.  HENT: Negative for ear pain and sore throat.   Eyes: Negative for pain and visual disturbance.  Respiratory: Negative for cough, chest tightness and shortness of breath.   Cardiovascular: Negative for chest pain, palpitations and leg swelling.  Gastrointestinal: Positive for nausea. Negative for abdominal pain, blood in stool, constipation, diarrhea and vomiting.  Genitourinary: Positive for decreased urine volume. Negative for dysuria and hematuria.  Musculoskeletal: Negative for arthralgias and back pain.  Skin: Negative for color change and rash.  Neurological: Negative for seizures and syncope.  All other systems reviewed and are negative.    Physical Exam Updated Vital  Signs BP (!) 213/115   Pulse 96   Temp 98.1 F (36.7 C) (Oral)   Resp 17   LMP 04/01/2010   SpO2 98%   Physical Exam  Constitutional: She is oriented to person, place, and time. She appears well-developed and well-nourished. No distress.  HENT:  Head: Normocephalic and atraumatic.  Eyes: Pupils are equal, round, and reactive to light. Conjunctivae are normal.  Neck: Neck supple.  Cardiovascular: Normal rate and regular rhythm.   No murmur heard. Pulmonary/Chest: Effort normal and breath sounds normal. No respiratory distress.  Abdominal: Soft. There is no tenderness.  Musculoskeletal: She exhibits no edema or tenderness.  Neurological: She is alert and oriented to person, place, and time. She has normal strength. She displays no tremor. No cranial nerve deficit or sensory deficit. Coordination normal.  Skin: Skin is warm and dry. No rash noted. She is not diaphoretic.  Psychiatric: She has  a normal mood and affect.  Nursing note and vitals reviewed.  ED Treatments / Results  Labs (all labs ordered are listed, but only abnormal results are displayed) Labs Reviewed  BASIC METABOLIC PANEL - Abnormal; Notable for the following:       Result Value   Sodium 132 (*)    Chloride 95 (*)    Glucose, Bld 464 (*)    All other components within normal limits  URINALYSIS, ROUTINE W REFLEX MICROSCOPIC - Abnormal; Notable for the following:    Color, Urine STRAW (*)    Glucose, UA >=500 (*)    Hgb urine dipstick SMALL (*)    Protein, ur >=300 (*)    Bacteria, UA RARE (*)    Squamous Epithelial / LPF 0-5 (*)    All other components within normal limits  CBG MONITORING, ED - Abnormal; Notable for the following:    Glucose-Capillary 436 (*)    All other components within normal limits  CBG MONITORING, ED - Abnormal; Notable for the following:    Glucose-Capillary 413 (*)    All other components within normal limits  I-STAT VENOUS BLOOD GAS, ED - Abnormal; Notable for the following:     pO2, Ven 49.0 (*)    Bicarbonate 30.3 (*)    Acid-Base Excess 3.0 (*)    All other components within normal limits  CBG MONITORING, ED - Abnormal; Notable for the following:    Glucose-Capillary 359 (*)    All other components within normal limits  CBC  I-STAT TROPONIN, ED    EKG  EKG Interpretation  Date/Time:  Monday April 04 2017 16:26:17 EDT Ventricular Rate:  91 PR Interval:  156 QRS Duration: 88 QT Interval:  382 QTC Calculation: 469 R Axis:   8 Text Interpretation:  Normal sinus rhythm Moderate voltage criteria for LVH, may be normal variant Borderline ECG No significant change since last tracing Confirmed by Merrily Pew [phone]) on 04/04/2017 5:25:47 PM       Radiology Ct Head Wo Contrast  Result Date: 04/04/2017 CLINICAL DATA:  Headache altered mental status EXAM: CT HEAD WITHOUT CONTRAST TECHNIQUE: Contiguous axial images were obtained from the base of the skull through the vertex without intravenous contrast. COMPARISON:  03/02/2011 FINDINGS: Brain: Mild brain atrophy and chronic microvascular ischemic changes. Acute intracranial hemorrhage, mass lesion, midline shift, herniation, hydrocephalus, or extra-axial fluid collection normal gray-white matter differentiation. No focal mass effect or edema. Cisterns are patent. No cerebellar abnormality. Vascular: No hyperdense vessel or unexpected calcification. Skull: Normal. Negative for fracture or focal lesion. Sinuses/Orbits: Chronic right sphenoid sinus disease Other: None. IMPRESSION: Mild brain atrophy and chronic white matter microvascular ischemic changes. No acute intracranial abnormality by noncontrast CT. Electronically Signed   By: Jerilynn Mages.  Shick M.D.   On: 04/04/2017 18:51    Procedures Procedures (including critical care time)  Medications Ordered in ED Medications  lactated ringers bolus 1,000 mL (0 mLs Intravenous Stopped 04/04/17 1920)  ondansetron (ZOFRAN) injection 4 mg (4 mg Intravenous Given 04/04/17 1752)   lisinopril (PRINIVIL,ZESTRIL) tablet 20 mg (20 mg Oral Given 04/04/17 1752)  acetaminophen (TYLENOL) tablet 1,000 mg (1,000 mg Oral Given 04/04/17 1752)  insulin aspart (novoLOG) injection 5 Units (5 Units Intravenous Given 04/04/17 2058)     Initial Impression / Assessment and Plan / ED Course  I have reviewed the triage vital signs and the nursing notes.  Pertinent labs & imaging results that were available during my care of the patient were reviewed by me and  considered in my medical decision making (see chart for details).     This is a 56 year old female with PMH of T2 DM on insulin, HTN, poor medication compliance who presents from home with continued headache all day, nausea, general fatigue.   Patient lethargic however answers all questions, follows all commands.  Neurologically intact as noted above.  IV placed at bedside patient given 2 L normal saline LR.  Initial blood glucose 436.  Patient states she missed her insulin dose yesterday evening however has not eaten much today due to her nausea.  Anion gap 11, CO2 26. Creatinine stable 0.96, no leukocytosis on laboratory studies. BP 846 systolic on initial evaluation.   EKG reviewed. Sinus tachycardia with no ischemic changes noted.  CT head ordered and Negative for any acute intraconal pathology.  Blood pressure much improved after lisinopril home dose was given, 659D systolic.  After 2 boluses of LR, blood sugar remeasured to be 413.  Patient given 5 units IV insulin and CBG 359. Instructed patient to resume home insulin regimen.  Discussed discharge with close PCP follow-up.  Patient and her significant other in agreement.  Final Clinical Impressions(s) / ED Diagnoses   Final diagnoses:  Essential hypertension  Hyperglycemia  Dehydration    New Prescriptions New Prescriptions   No medications on file     Aldona Lento, MD 04/04/17 2153    Merrily Pew, MD 04/05/17 [phone]

## 2017-04-04 NOTE — ED Notes (Signed)
Patient Alert and oriented X4. Stable and ambulatory. Patient verbalized understanding of the discharge instructions.  Patient belongings were taken by the patient.  

## 2017-04-12 ENCOUNTER — Ambulatory Visit: Payer: Medicaid Other | Attending: Family Medicine | Admitting: Family Medicine

## 2017-04-12 ENCOUNTER — Encounter: Payer: Self-pay | Admitting: Family Medicine

## 2017-04-12 VITALS — BP 160/90 | HR 95 | Temp 97.4°F | Ht 66.0 in | Wt 212.6 lb

## 2017-04-12 DIAGNOSIS — E119 Type 2 diabetes mellitus without complications: Secondary | ICD-10-CM | POA: Diagnosis present

## 2017-04-12 DIAGNOSIS — Z9111 Patient's noncompliance with dietary regimen: Secondary | ICD-10-CM | POA: Insufficient documentation

## 2017-04-12 DIAGNOSIS — E1149 Type 2 diabetes mellitus with other diabetic neurological complication: Secondary | ICD-10-CM

## 2017-04-12 DIAGNOSIS — I10 Essential (primary) hypertension: Secondary | ICD-10-CM | POA: Diagnosis not present

## 2017-04-12 DIAGNOSIS — Z794 Long term (current) use of insulin: Secondary | ICD-10-CM | POA: Diagnosis not present

## 2017-04-12 DIAGNOSIS — Z79899 Other long term (current) drug therapy: Secondary | ICD-10-CM | POA: Diagnosis not present

## 2017-04-12 DIAGNOSIS — Z8619 Personal history of other infectious and parasitic diseases: Secondary | ICD-10-CM | POA: Insufficient documentation

## 2017-04-12 DIAGNOSIS — Z9114 Patient's other noncompliance with medication regimen: Secondary | ICD-10-CM | POA: Diagnosis not present

## 2017-04-12 DIAGNOSIS — E11311 Type 2 diabetes mellitus with unspecified diabetic retinopathy with macular edema: Secondary | ICD-10-CM

## 2017-04-12 DIAGNOSIS — E7800 Pure hypercholesterolemia, unspecified: Secondary | ICD-10-CM | POA: Diagnosis not present

## 2017-04-12 DIAGNOSIS — Z91148 Patient's other noncompliance with medication regimen for other reason: Secondary | ICD-10-CM | POA: Insufficient documentation

## 2017-04-12 DIAGNOSIS — E1140 Type 2 diabetes mellitus with diabetic neuropathy, unspecified: Secondary | ICD-10-CM | POA: Insufficient documentation

## 2017-04-12 LAB — GLUCOSE, POCT (MANUAL RESULT ENTRY)
POC Glucose: 544 mg/dl — AB (ref 70–99)
POC Glucose: 519 mg/dl — AB (ref 70–99)

## 2017-04-12 LAB — POCT GLYCOSYLATED HEMOGLOBIN (HGB A1C): Hemoglobin A1C: 14.7

## 2017-04-12 MED ORDER — INSULIN ASPART 100 UNIT/ML ~~LOC~~ SOLN
30.0000 [IU] | Freq: Once | SUBCUTANEOUS | Status: AC
  Administered 2017-04-12: 30 [IU] via SUBCUTANEOUS

## 2017-04-12 MED ORDER — CLONIDINE HCL 0.1 MG PO TABS
0.1000 mg | ORAL_TABLET | Freq: Once | ORAL | Status: AC
  Administered 2017-04-12: 0.1 mg via ORAL

## 2017-04-12 MED ORDER — METFORMIN HCL 1000 MG PO TABS: 1000.0000 mg | ORAL_TABLET | Freq: Two times a day (BID) | ORAL | 5 refills | Status: DC

## 2017-04-12 MED ORDER — AMLODIPINE BESYLATE 10 MG PO TABS: 10.0000 mg | ORAL_TABLET | Freq: Every day | ORAL | 5 refills | Status: DC

## 2017-04-12 MED ORDER — LISINOPRIL-HYDROCHLOROTHIAZIDE 20-12.5 MG PO TABS: 2.0000 | ORAL_TABLET | Freq: Every day | ORAL | 5 refills | Status: DC

## 2017-04-12 MED ORDER — INSULIN ASPART 100 UNIT/ML FLEXPEN: PEN_INJECTOR | SUBCUTANEOUS | 5 refills | Status: DC

## 2017-04-12 MED ORDER — GABAPENTIN 300 MG PO CAPS: 300.0000 mg | ORAL_CAPSULE | Freq: Two times a day (BID) | ORAL | 5 refills | Status: DC

## 2017-04-12 MED ORDER — ATORVASTATIN CALCIUM 20 MG PO TABS: 20.0000 mg | ORAL_TABLET | Freq: Every day | ORAL | 5 refills | Status: DC

## 2017-04-12 MED ORDER — INSULIN GLARGINE 100 UNIT/ML SOLOSTAR PEN: 45.0000 [IU] | PEN_INJECTOR | Freq: Every day | SUBCUTANEOUS | 5 refills | Status: DC

## 2017-04-12 MED ORDER — ALBUTEROL SULFATE HFA 108 (90 BASE) MCG/ACT IN AERS: 2.0000 | INHALATION_SPRAY | Freq: Four times a day (QID) | RESPIRATORY_TRACT | 3 refills | Status: DC | PRN

## 2017-04-12 NOTE — Patient Instructions (Signed)
Diabetes Mellitus and Food It is important for you to manage your blood sugar (glucose) level. Your blood glucose level can be greatly affected by what you eat. Eating healthier foods in the appropriate amounts throughout the day at about the same time each day will help you control your blood glucose level. It can also help slow or prevent worsening of your diabetes mellitus. Healthy eating may even help you improve the level of your blood pressure and reach or maintain a healthy weight. General recommendations for healthful eating and cooking habits include:  Eating meals and snacks regularly. Avoid going long periods of time without eating to lose weight.  Eating a diet that consists mainly of plant-based foods, such as fruits, vegetables, nuts, legumes, and whole grains.  Using low-heat cooking methods, such as baking, instead of high-heat cooking methods, such as deep frying.  Work with your dietitian to make sure you understand how to use the Nutrition Facts information on food labels. How can food affect me? Carbohydrates Carbohydrates affect your blood glucose level more than any other type of food. Your dietitian will help you determine how many carbohydrates to eat at each meal and teach you how to count carbohydrates. Counting carbohydrates is important to keep your blood glucose at a healthy level, especially if you are using insulin or taking certain medicines for diabetes mellitus. Alcohol Alcohol can cause sudden decreases in blood glucose (hypoglycemia), especially if you use insulin or take certain medicines for diabetes mellitus. Hypoglycemia can be a life-threatening condition. Symptoms of hypoglycemia (sleepiness, dizziness, and disorientation) are similar to symptoms of having too much alcohol. If your health care provider has given you approval to drink alcohol, do so in moderation and use the following guidelines:  Women should not have more than one drink per day, and men  should not have more than two drinks per day. One drink is equal to: ? 12 oz of beer. ? 5 oz of wine. ? 1 oz of hard liquor.  Do not drink on an empty stomach.  Keep yourself hydrated. Have water, diet soda, or unsweetened iced tea.  Regular soda, juice, and other mixers might contain a lot of carbohydrates and should be counted.  What foods are not recommended? As you make food choices, it is important to remember that all foods are not the same. Some foods have fewer nutrients per serving than other foods, even though they might have the same number of calories or carbohydrates. It is difficult to get your body what it needs when you eat foods with fewer nutrients. Examples of foods that you should avoid that are high in calories and carbohydrates but low in nutrients include:  Trans fats (most processed foods list trans fats on the Nutrition Facts label).  Regular soda.  Juice.  Candy.  Sweets, such as cake, pie, doughnuts, and cookies.  Fried foods.  What foods can I eat? Eat nutrient-rich foods, which will nourish your body and keep you healthy. The food you should eat also will depend on several factors, including:  The calories you need.  The medicines you take.  Your weight.  Your blood glucose level.  Your blood pressure level.  Your cholesterol level.  You should eat a variety of foods, including:  Protein. ? Lean cuts of meat. ? Proteins low in saturated fats, such as fish, egg whites, and beans. Avoid processed meats.  Fruits and vegetables. ? Fruits and vegetables that may help control blood glucose levels, such as apples,   mangoes, and yams.  Dairy products. ? Choose fat-free or low-fat dairy products, such as milk, yogurt, and cheese.  Grains, bread, pasta, and rice. ? Choose whole grain products, such as multigrain bread, whole oats, and brown rice. These foods may help control blood pressure.  Fats. ? Foods containing healthful fats, such as  nuts, avocado, olive oil, canola oil, and fish.  Does everyone with diabetes mellitus have the same meal plan? Because every person with diabetes mellitus is different, there is not one meal plan that works for everyone. It is very important that you meet with a dietitian who will help you create a meal plan that is just right for you. This information is not intended to replace advice given to you by your health care provider. Make sure you discuss any questions you have with your health care provider. Document Released: 04/01/2005 Document Revised: 12/11/2015 Document Reviewed: 06/01/2013 [employer] Interactive Patient Education  2017 [employer]  

## 2017-04-12 NOTE — Progress Notes (Signed)
Subjective:  Patient ID: [name] [name], female    DOB: 1960/11/06  Age: 56 y.o. MRN: [id number]  CC: Diabetes   HPI [name] [name] is a 56 year old female with a history of type 2 diabetes mellitus (A1c 14.7 which is up from 10.2), diabetic neuropathy hypertension, diabetic retinopathy, hyperlipidemia, hepatitis C (completed treatment with have bony) who presents today for a follow-up visit.  Her blood sugar in the clinic is 544 and endorses ingesting a meal from [place] not long ago and admits to not taking her Lantus last night or her NovoLog this morning and has no explanation as to why she did not. She has not been checking her sugars as her glucometer is over her sister's place. NovoLog 30 units administered here in the clinic. Her blood pressure is also significantly elevated and she has been taking one of her lisinopril/hydrochlorothiazide pills rather than 2 daily as prescribed.  She had an ED visit last week where she presented with headache, fatigue and was found to have significantly elevated blood pressure with systolic blood pressure in the 200s and hyperglycemia of 436. She was treated with IV fluids, insulin, lisinopril. CT head was negative for acute intracranial abnormality; she was subsequently discharged after her symptoms improved.  Past Medical History:  Diagnosis Date  . Anemia   . Chronic hepatitis C without hepatic coma ([place]) 11/09/2016  . Diabetes mellitus   . Fibroids   . 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 Hypertension   . MRSA (methicillin resistant Staphylococcus aureus)   . Trichomonas   . VAGINITIS, BACTERIAL, RECURRENT 08/15/2007   Qualifier: Diagnosis of  By: Radene Ou MD, [place]       Outpatient Medications Prior to Visit  Medication Sig Dispense Refill  . ACCU-CHEK SOFTCLIX LANCETS lancets Use to test blood sugar 3 times daily as instructed. Dx code: 250.50 100 each 11  . Blood Glucose Monitoring Suppl  (ACCU-CHEK AVIVA) device Use as instructed daily. 1 each 0  . estradiol (ESTRING) 2 MG vaginal ring Place 2 mg vaginally every 3 (three) months. follow package directions 1 each 12  . Glecaprevir-Pibrentasvir (MAVYRET) 100-40 MG TABS Take 3 tablets by mouth daily with breakfast. 84 tablet 0  . glucose blood (ACCU-CHEK AVIVA PLUS) test strip Use to test blood sugar 3 times daily as instructed. Dx code: E11.319 100 each 11  . HYDROcodone-homatropine (HYCODAN) 5-1.5 MG/5ML syrup Take 5 mLs by mouth every 6 (six) hours as needed for cough. 120 mL 0  . Insulin Pen Needle (BD PEN NEEDLE NANO U/F) 32G X 4 MM MISC Use to inject insulin 4 times daily. 150 each 11  . nitroGLYCERIN (NITROSTAT) 0.4 MG SL tablet Place 0.4 mg under the tongue every 5 (five) minutes as needed for chest pain.    Marland Kitchen albuterol (PROVENTIL HFA;VENTOLIN HFA) 108 (90 Base) MCG/ACT inhaler Inhale 2 puffs into the lungs every 6 (six) hours as needed for wheezing or shortness of breath. 1 Inhaler 3  . atorvastatin (LIPITOR) 20 MG tablet Take 1 tablet (20 mg total) by mouth daily. 30 tablet 5  . gabapentin (NEURONTIN) 300 MG capsule Take 1 capsule (300 mg total) by mouth 2 (two) times daily. 60 capsule 5  . insulin aspart (NOVOLOG FLEXPEN) 100 UNIT/ML FlexPen 0-12 units three times daily as per sliding scale. 3 pen 5  . Insulin Glargine (LANTUS SOLOSTAR) 100 UNIT/ML Solostar Pen Inject 45 Units into the skin at bedtime. 5 pen 5  .  lisinopril-hydrochlorothiazide (ZESTORETIC) 20-12.5 MG tablet Take 2 tablets by mouth daily. (Patient taking differently: Take 1 tablet by mouth daily. ) 60 tablet 3  . metFORMIN (GLUCOPHAGE) 1000 MG tablet Take 1 tablet (1,000 mg total) by mouth 2 (two) times daily with a meal. 60 tablet 5  . progesterone (PROMETRIUM) 100 MG capsule Take 1 capsule (100 mg total) by mouth daily. (Patient not taking: Reported on 02/01/2017) 30 capsule 3   Facility-Administered Medications Prior to Visit  Medication Dose Route  Frequency Provider Last Rate Last Dose  . 0.9 %  sodium chloride infusion  500 mL Intravenous Continuous Irene Shipper, MD        ROS Review of Systems  Constitutional: Negative for activity change, appetite change and fatigue.  HENT: Negative for congestion, sinus pressure and sore throat.   Eyes: Negative for visual disturbance.  Respiratory: Negative for cough, chest tightness, shortness of breath and wheezing.   Cardiovascular: Negative for chest pain and palpitations.  Gastrointestinal: Negative for abdominal distention, abdominal pain and constipation.  Endocrine: Negative for polydipsia.  Genitourinary: Negative for dysuria and frequency.  Musculoskeletal: Negative for arthralgias and back pain.  Skin: Negative for rash.  Neurological: Negative for tremors, light-headedness and numbness.  Hematological: Does not bruise/bleed easily.  Psychiatric/Behavioral: Negative for agitation and behavioral problems.     Objective:  BP (!) 176/111   Pulse 95   Temp (!) 97.4 F (36.3 C) (Oral)   Ht 5\' 6"  (1.676 m)   Wt 212 lb 9.6 oz (96.4 kg)   LMP 04/01/2010   SpO2 97%   BMI 34.31 kg/m   BP/Weight 04/12/2017 04/04/2017 8/41/6606  Systolic BP 301 601 093  Diastolic BP 235 84 89  Wt. (Lbs) 212.6 - 211.9  BMI 34.31 - 34.2      Physical Exam  Constitutional: She is oriented to person, place, and time. She appears well-developed and well-nourished.  Cardiovascular: Normal rate, normal heart sounds and intact distal pulses.   No murmur heard. Pulmonary/Chest: Effort normal and breath sounds normal. She has no wheezes. She has no rales. She exhibits no tenderness.  Abdominal: Soft. Bowel sounds are normal. She exhibits no distension and no mass. There is no tenderness.  Musculoskeletal: Normal range of motion.  Neurological: She is alert and oriented to person, place, and time.  Skin: Skin is warm and dry.  Psychiatric: She has a normal mood and affect.     CMP Latest Ref Rng  & Units 04/04/2017 03/03/2017 01/18/2017  Glucose 65 - 99 mg/dL 464(H) 396(H) 204(H)  BUN 6 - 20 mg/dL 17 26(H) 9  Creatinine 0.44 - 1.00 mg/dL 0.96 1.03(H) 0.70  Sodium 135 - 145 mmol/L 132(L) 133(L) 139  Potassium 3.5 - 5.1 mmol/L 4.5 4.0 3.4(L)  Chloride 101 - 111 mmol/L 95(L) 100(L) 106  CO2 22 - 32 mmol/L 26 28 25   Calcium 8.9 - 10.3 mg/dL 9.5 8.8(L) 8.6(L)  Total Protein 6.0 - 8.5 g/dL - - -  Total Bilirubin 0.0 - 1.2 mg/dL - - -  Alkaline Phos 39 - 117 IU/L - - -  AST 0 - 40 IU/L - - -  ALT 6 - 29 U/L - - -    Lipid Panel     Component Value Date/Time   CHOL 172 11/08/2016 1427   TRIG 100 11/08/2016 1427   HDL 80 11/08/2016 1427   CHOLHDL 2.2 11/08/2016 1427   CHOLHDL 3 06/18/2014 0951   VLDL 19.6 06/18/2014 0951   LDLCALC 72  11/08/2016 1427    Lab Results  Component Value Date   HGBA1C 14.7 04/12/2017    Assessment & Plan:   1. 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, with long-term current use of insulin, unspecified retinopathy severity (HCC) Uncontrolled with A1c of 14.7 due to noncompliance with medications and nonadherence to diabetic diet Advised on correct dosing of Lantus and NovoLog has been switched from sliding scale to fixed dose regimen NovoLog 30 units administered due to CBG of 544 in the clinic and repeat after 45 minutes was 519 Strongly emphasized the need to be compliant with diabetic diet, exercise and other lifestyle modifications - POCT glucose (manual entry) - POCT glycosylated hemoglobin (Hb A1C) - insulin aspart (novoLOG) injection 30 Units; Inject 0.3 mLs (30 Units total) into the skin once. - insulin aspart (NOVOLOG FLEXPEN) 100 UNIT/ML FlexPen; 5 units subcutaneously before breakfast and lunch and 10 units before dinner  Dispense: 3 pen; Refill: 5  2. Essential hypertension Uncontrolled due to noncompliance Clonidine 0.1 mg administered Amlodipine added to her regimen - cloNIDine (CATAPRES)  tablet 0.1 mg; Take 1 tablet (0.1 mg total) by mouth once. - amLODipine (NORVASC) 10 MG tablet; Take 1 tablet (10 mg total) by mouth daily.  Dispense: 30 tablet; Refill: 5 - lisinopril-hydrochlorothiazide (ZESTORETIC) 20-12.5 MG tablet; Take 2 tablets by mouth daily.  Dispense: 60 tablet; Refill: 5  3. Type 2 diabetes mellitus with other neurologic complication, with long-term current use of insulin (HCC) - metFORMIN (GLUCOPHAGE) 1000 MG tablet; Take 1 tablet (1,000 mg total) by mouth 2 (two) times daily with a meal.  Dispense: 60 tablet; Refill: 5 - gabapentin (NEURONTIN) 300 MG capsule; Take 1 capsule (300 mg total) by mouth 2 (two) times daily.  Dispense: 60 capsule; Refill: 5 - Insulin Glargine (LANTUS SOLOSTAR) 100 UNIT/ML Solostar Pen; Inject 45 Units into the skin at bedtime.  Dispense: 5 pen; Refill: 5  4. Pure hypercholesterolemia Controlled Low-cholesterol diet - atorvastatin (LIPITOR) 20 MG tablet; Take 1 tablet (20 mg total) by mouth daily.  Dispense: 30 tablet; Refill: 5  5. Non compliance w medication regimen Discussed implications of noncompliance and she does seem ready to be more proactive with regards to her health as at previous visits only for her to resort back to previous behavior  Meds ordered this encounter  Medications  . insulin aspart (novoLOG) injection 30 Units  . cloNIDine (CATAPRES) tablet 0.1 mg  . amLODipine (NORVASC) 10 MG tablet    Sig: Take 1 tablet (10 mg total) by mouth daily.    Dispense:  30 tablet    Refill:  5  . metFORMIN (GLUCOPHAGE) 1000 MG tablet    Sig: Take 1 tablet (1,000 mg total) by mouth 2 (two) times daily with a meal.    Dispense:  60 tablet    Refill:  5  . albuterol (PROVENTIL HFA;VENTOLIN HFA) 108 (90 Base) MCG/ACT inhaler    Sig: Inhale 2 puffs into the lungs every 6 (six) hours as needed for wheezing or shortness of breath.    Dispense:  1 Inhaler    Refill:  3  . lisinopril-hydrochlorothiazide (ZESTORETIC) 20-12.5 MG tablet      Sig: Take 2 tablets by mouth daily.    Dispense:  60 tablet    Refill:  5    Discontinue previous dose  . atorvastatin (LIPITOR) 20 MG tablet    Sig: Take 1 tablet (20 mg total) by mouth daily.    Dispense:  30 tablet    Refill:  5  . gabapentin (NEURONTIN) 300 MG capsule    Sig: Take 1 capsule (300 mg total) by mouth 2 (two) times daily.    Dispense:  60 capsule    Refill:  5  . Insulin Glargine (LANTUS SOLOSTAR) 100 UNIT/ML Solostar Pen    Sig: Inject 45 Units into the skin at bedtime.    Dispense:  5 pen    Refill:  5  . insulin aspart (NOVOLOG FLEXPEN) 100 UNIT/ML FlexPen    Sig: 5 units subcutaneously before breakfast and lunch and 10 units before dinner    Dispense:  3 pen    Refill:  5    Discontinue previous instruction    Follow-up: Return in about 3 weeks (around 05/03/2017) for follow up of Diabetes.   Arnoldo Morale MD

## 2017-05-06 ENCOUNTER — Encounter: Payer: Self-pay | Admitting: Family Medicine

## 2017-05-06 ENCOUNTER — Ambulatory Visit: Payer: Medicaid Other | Attending: Family Medicine | Admitting: Family Medicine

## 2017-05-06 VITALS — BP 138/85 | HR 93 | Temp 97.5°F | Ht 66.0 in | Wt 219.4 lb

## 2017-05-06 DIAGNOSIS — E785 Hyperlipidemia, unspecified: Secondary | ICD-10-CM | POA: Diagnosis not present

## 2017-05-06 DIAGNOSIS — Z794 Long term (current) use of insulin: Secondary | ICD-10-CM | POA: Diagnosis not present

## 2017-05-06 DIAGNOSIS — B182 Chronic viral hepatitis C: Secondary | ICD-10-CM | POA: Insufficient documentation

## 2017-05-06 DIAGNOSIS — N898 Other specified noninflammatory disorders of vagina: Secondary | ICD-10-CM | POA: Insufficient documentation

## 2017-05-06 DIAGNOSIS — E11311 Type 2 diabetes mellitus with unspecified diabetic retinopathy with macular edema: Secondary | ICD-10-CM

## 2017-05-06 DIAGNOSIS — E1140 Type 2 diabetes mellitus with diabetic neuropathy, unspecified: Secondary | ICD-10-CM | POA: Diagnosis not present

## 2017-05-06 DIAGNOSIS — I10 Essential (primary) hypertension: Secondary | ICD-10-CM | POA: Insufficient documentation

## 2017-05-06 DIAGNOSIS — Z79899 Other long term (current) drug therapy: Secondary | ICD-10-CM | POA: Insufficient documentation

## 2017-05-06 DIAGNOSIS — E119 Type 2 diabetes mellitus without complications: Secondary | ICD-10-CM | POA: Diagnosis present

## 2017-05-06 LAB — GLUCOSE, POCT (MANUAL RESULT ENTRY)
POC GLUCOSE: 431 mg/dL — AB (ref 70–99)
POC Glucose: 302 mg/dl — AB (ref 70–99)

## 2017-05-06 MED ORDER — FLUCONAZOLE 150 MG PO TABS: 150.0000 mg | ORAL_TABLET | Freq: Once | ORAL | 0 refills | Status: AC

## 2017-05-06 MED ORDER — INSULIN ASPART 100 UNIT/ML ~~LOC~~ SOLN
30.0000 [IU] | Freq: Once | SUBCUTANEOUS | Status: AC
  Administered 2017-05-06: 30 [IU] via SUBCUTANEOUS

## 2017-05-06 MED ORDER — EXENATIDE 10 MCG/0.04ML ~~LOC~~ SOPN: 10.0000 ug | PEN_INJECTOR | Freq: Two times a day (BID) | SUBCUTANEOUS | 1 refills | Status: DC

## 2017-05-06 NOTE — Progress Notes (Signed)
Subjective:  Patient ID: [name] [name], female    DOB: 1960-11-13  Age: 56 y.o. MRN: [id number]  CC: Diabetes   HPI [name] [name] is a 56 year old female with a history of type 2 diabetes mellitus (A1c 14.7), diabetic neuropathy hypertension, diabetic retinopathy, hyperlipidemia, hepatitis C (completed treatment with harvoni) who presents today for a follow-up visit.  She complains of diarrhea with metformin use to the extent that she messes up her bed in her sleep. She endorses compliance with Lantus and NovoLog and blood sugars have ranged from 118-295 but her blood sugar this morning was 431 and she endorses not taking any medications this morning as she was in a hurry.  She complains of vaginal irritation which she describes as burning with sexual intercourse. She is currently being seen by GYN for complains of dyspareunia and used to estradiol ring with no improvement in symptoms. She denies vaginal discharge or itching.  Her blood pressure was elevated at her last office visit but is controlled today and she endorses compliance with medications.  Past Medical History:  Diagnosis Date  . Anemia   . Chronic hepatitis C without hepatic coma ([place]) 11/09/2016  . Diabetes mellitus   . Fibroids   . 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 Hypertension   . MRSA (methicillin resistant Staphylococcus aureus)   . Trichomonas   . VAGINITIS, BACTERIAL, RECURRENT 08/15/2007   Qualifier: Diagnosis of  By: Radene Ou MD, [place]      Past Surgical History:  Procedure Laterality Date  . CESAREAN SECTION     breech    No Known Allergies   Outpatient Medications Prior to Visit  Medication Sig Dispense Refill  . ACCU-CHEK SOFTCLIX LANCETS lancets Use to test blood sugar 3 times daily as instructed. Dx code: 250.50 100 each 11  . albuterol (PROVENTIL HFA;VENTOLIN HFA) 108 (90 Base) MCG/ACT inhaler Inhale 2 puffs into the lungs every 6 (six) hours as needed for  wheezing or shortness of breath. 1 Inhaler 3  . amLODipine (NORVASC) 10 MG tablet Take 1 tablet (10 mg total) by mouth daily. 30 tablet 5  . atorvastatin (LIPITOR) 20 MG tablet Take 1 tablet (20 mg total) by mouth daily. 30 tablet 5  . Blood Glucose Monitoring Suppl (ACCU-CHEK AVIVA) device Use as instructed daily. 1 each 0  . gabapentin (NEURONTIN) 300 MG capsule Take 1 capsule (300 mg total) by mouth 2 (two) times daily. 60 capsule 5  . insulin aspart (NOVOLOG FLEXPEN) 100 UNIT/ML FlexPen 5 units subcutaneously before breakfast and lunch and 10 units before dinner 3 pen 5  . Insulin Glargine (LANTUS SOLOSTAR) 100 UNIT/ML Solostar Pen Inject 45 Units into the skin at bedtime. 5 pen 5  . Insulin Pen Needle (BD PEN NEEDLE NANO U/F) 32G X 4 MM MISC Use to inject insulin 4 times daily. 150 each 11  . lisinopril-hydrochlorothiazide (ZESTORETIC) 20-12.5 MG tablet Take 2 tablets by mouth daily. 60 tablet 5  . nitroGLYCERIN (NITROSTAT) 0.4 MG SL tablet Place 0.4 mg under the tongue every 5 (five) minutes as needed for chest pain.    Marland Kitchen estradiol (ESTRING) 2 MG vaginal ring Place 2 mg vaginally every 3 (three) months. follow package directions (Patient not taking: Reported on 05/06/2017) 1 each 12  . Glecaprevir-Pibrentasvir (MAVYRET) 100-40 MG TABS Take 3 tablets by mouth daily with breakfast. (Patient not taking: Reported on 05/06/2017) 84 tablet 0  . glucose blood (ACCU-CHEK AVIVA PLUS) test strip Use  to test blood sugar 3 times daily as instructed. Dx code: E11.319 (Patient not taking: Reported on 05/06/2017) 100 each 11  . progesterone (PROMETRIUM) 100 MG capsule Take 1 capsule (100 mg total) by mouth daily. (Patient not taking: Reported on 02/01/2017) 30 capsule 3  . HYDROcodone-homatropine (HYCODAN) 5-1.5 MG/5ML syrup Take 5 mLs by mouth every 6 (six) hours as needed for cough. (Patient not taking: Reported on 05/06/2017) 120 mL 0  . metFORMIN (GLUCOPHAGE) 1000 MG tablet Take 1 tablet (1,000 mg total) by  mouth 2 (two) times daily with a meal. (Patient not taking: Reported on 05/06/2017) 60 tablet 5   Facility-Administered Medications Prior to Visit  Medication Dose Route Frequency Provider Last Rate Last Dose  . 0.9 %  sodium chloride infusion  500 mL Intravenous Continuous Irene Shipper, MD        ROS Review of Systems  Constitutional: Negative for activity change, appetite change and fatigue.  HENT: Negative for congestion, sinus pressure and sore throat.   Eyes: Negative for visual disturbance.  Respiratory: Negative for cough, chest tightness, shortness of breath and wheezing.   Cardiovascular: Negative for chest pain and palpitations.  Gastrointestinal: Negative for abdominal distention, abdominal pain and constipation.  Endocrine: Negative for polydipsia.  Genitourinary: Negative for dysuria and frequency.  Musculoskeletal: Negative for arthralgias and back pain.  Skin: Negative for rash.  Neurological: Negative for tremors, light-headedness and numbness.  Hematological: Does not bruise/bleed easily.  Psychiatric/Behavioral: Negative for agitation and behavioral problems.    Objective:  BP 138/85   Pulse 93   Temp (!) 97.5 F (36.4 C) (Oral)   Ht 5\' 6"  (1.676 m)   Wt 219 lb 6.4 oz (99.5 kg)   LMP 04/01/2010   SpO2 98%   BMI 35.41 kg/m   BP/Weight 05/06/2017 04/12/2017 0/86/7619  Systolic BP 509 326 712  Diastolic BP 85 90 84  Wt. (Lbs) 219.4 212.6 -  BMI 35.41 34.31 -      Physical Exam  Constitutional: She is oriented to person, place, and time. She appears well-developed and well-nourished.  Cardiovascular: Normal rate, normal heart sounds and intact distal pulses.   No murmur heard. Pulmonary/Chest: Effort normal and breath sounds normal. She has no wheezes. She has no rales. She exhibits no tenderness.  Abdominal: Soft. Bowel sounds are normal. She exhibits no distension and no mass. There is no tenderness.  Musculoskeletal: Normal range of motion.    Neurological: She is alert and oriented to person, place, and time.  Skin: Skin is warm and dry.  Psychiatric: She has a normal mood and affect.     Lab Results  Component Value Date   HGBA1C 14.7 04/12/2017    Assessment & Plan:   1. 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 - POCT glucose (manual entry) - insulin aspart (novoLOG) injection 30 Units; Inject 0.3 mLs (30 Units total) into the skin once. - exenatide (BYETTA 10 MCG PEN) 10 MCG/0.04ML SOPN injection; Inject 0.04 mLs (10 mcg total) into the skin 2 (two) times daily with a meal.  Dispense: 30 mL; Refill: 1  2. Essential hypertension Controlled Low-sodium,DASH diet  3. Vaginal irritation Could be secondary to uncontrolled diabetes and hyperglycemia - fluconazole (DIFLUCAN)  150 MG tablet; Take 1 tablet (150 mg total) by mouth once.  Dispense: 1 tablet; Refill: 0   Meds ordered this encounter  Medications  . insulin aspart (novoLOG) injection 30 Units  . exenatide (BYETTA 10 MCG PEN) 10 MCG/0.04ML SOPN injection    Sig: Inject 0.04 mLs (10 mcg total) into the skin 2 (two) times daily with a meal.    Dispense:  30 mL    Refill:  1    Discontinue metformin  . fluconazole (DIFLUCAN) 150 MG tablet    Sig: Take 1 tablet (150 mg total) by mouth once.    Dispense:  1 tablet    Refill:  0    Follow-up: Return in about 1 month (around 06/06/2017) for Follow-up on diabetes mellitus.   Arnoldo Morale MD

## 2017-06-16 ENCOUNTER — Encounter: Payer: Self-pay | Admitting: Family Medicine

## 2017-06-16 ENCOUNTER — Encounter: Payer: Self-pay | Admitting: General Practice

## 2017-06-16 ENCOUNTER — Ambulatory Visit: Payer: Medicaid Other | Attending: Family Medicine | Admitting: Family Medicine

## 2017-06-16 ENCOUNTER — Other Ambulatory Visit: Payer: Self-pay

## 2017-06-16 ENCOUNTER — Telehealth: Payer: Self-pay | Admitting: General Practice

## 2017-06-16 VITALS — BP 151/91 | HR 93 | Temp 97.5°F | Ht 66.0 in | Wt 221.0 lb

## 2017-06-16 DIAGNOSIS — E1149 Type 2 diabetes mellitus with other diabetic neurological complication: Secondary | ICD-10-CM

## 2017-06-16 DIAGNOSIS — E785 Hyperlipidemia, unspecified: Secondary | ICD-10-CM | POA: Insufficient documentation

## 2017-06-16 DIAGNOSIS — N941 Unspecified dyspareunia: Secondary | ICD-10-CM | POA: Diagnosis not present

## 2017-06-16 DIAGNOSIS — R0789 Other chest pain: Secondary | ICD-10-CM | POA: Insufficient documentation

## 2017-06-16 DIAGNOSIS — E11311 Type 2 diabetes mellitus with unspecified diabetic retinopathy with macular edema: Secondary | ICD-10-CM | POA: Insufficient documentation

## 2017-06-16 DIAGNOSIS — Z794 Long term (current) use of insulin: Secondary | ICD-10-CM | POA: Insufficient documentation

## 2017-06-16 DIAGNOSIS — E1140 Type 2 diabetes mellitus with diabetic neuropathy, unspecified: Secondary | ICD-10-CM | POA: Diagnosis not present

## 2017-06-16 DIAGNOSIS — Z8619 Personal history of other infectious and parasitic diseases: Secondary | ICD-10-CM | POA: Diagnosis not present

## 2017-06-16 DIAGNOSIS — I10 Essential (primary) hypertension: Secondary | ICD-10-CM

## 2017-06-16 DIAGNOSIS — Z79899 Other long term (current) drug therapy: Secondary | ICD-10-CM | POA: Insufficient documentation

## 2017-06-16 DIAGNOSIS — E119 Type 2 diabetes mellitus without complications: Secondary | ICD-10-CM | POA: Diagnosis present

## 2017-06-16 LAB — GLUCOSE, POCT (MANUAL RESULT ENTRY)
POC GLUCOSE: 437 mg/dL — AB (ref 70–99)
POC Glucose: 449 mg/dl — AB (ref 70–99)

## 2017-06-16 MED ORDER — PREGABALIN 75 MG PO CAPS: 75.0000 mg | ORAL_CAPSULE | Freq: Two times a day (BID) | ORAL | 3 refills | Status: DC

## 2017-06-16 MED ORDER — INSULIN ASPART 100 UNIT/ML ~~LOC~~ SOLN
25.0000 [IU] | Freq: Once | SUBCUTANEOUS | Status: AC
  Administered 2017-06-16: 25 [IU] via SUBCUTANEOUS

## 2017-06-16 NOTE — Telephone Encounter (Signed)
Called patient's pharmacy and they are able to fill the Rx but will have to order it. Called & informed patient. Patient verbalized understanding & had no questions

## 2017-06-16 NOTE — Progress Notes (Signed)
Subjective:  Patient ID: [name] [name], female    DOB: January 23, 1961  Age: 56 y.o. MRN: [id number]  CC: Diabetes   HPI [name] [name]  is a 56 year old female with a history of type 2 diabetes mellitus (A1c 14.7), diabetic neuropathy hypertension, diabetic retinopathy, hyperlipidemia, hepatitis C (completed treatment with harvoni) who presents today for a follow-up visit.  She was commenced on Byetta at her last office visit and reports improvement in her blood sugars which are usually in the 100s but she endorses not taking her medication this morning as she was in a hurry to make it to her ophthalmologist. She is on Gabapentin for neuropathy and reports that effectiveness; she would like to try Lyrica.  Her blood pressure is elevated also due to not taking her antihypertensive.  She complains of pins and needles in her vagina especially during intercourse and this is associated with pain.  She was prescribed Estrace ring by GYN which her insurance never paid for.  She complains of intermittent chest pain at rest which lasts for a few seconds and causes her to sit still but then resolved spontaneously.  Pain does not radiate.   Past Medical History:  Diagnosis Date  . Anemia   . Chronic hepatitis C without hepatic coma ([place]) 11/09/2016  . Diabetes mellitus   . Fibroids   . 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 Hypertension   . MRSA (methicillin resistant Staphylococcus aureus)   . Trichomonas   . VAGINITIS, BACTERIAL, RECURRENT 08/15/2007   Qualifier: Diagnosis of  By: Radene Ou MD, [place]       Outpatient Medications Prior to Visit  Medication Sig Dispense Refill  . ACCU-CHEK SOFTCLIX LANCETS lancets Use to test blood sugar 3 times daily as instructed. Dx code: 250.50 100 each 11  . albuterol (PROVENTIL HFA;VENTOLIN HFA) 108 (90 Base) MCG/ACT inhaler Inhale 2 puffs into the lungs every 6 (six) hours as needed for wheezing or shortness of breath. 1  Inhaler 3  . amLODipine (NORVASC) 10 MG tablet Take 1 tablet (10 mg total) by mouth daily. 30 tablet 5  . atorvastatin (LIPITOR) 20 MG tablet Take 1 tablet (20 mg total) by mouth daily. 30 tablet 5  . Blood Glucose Monitoring Suppl (ACCU-CHEK AVIVA) device Use as instructed daily. 1 each 0  . exenatide (BYETTA 10 MCG PEN) 10 MCG/0.04ML SOPN injection Inject 0.04 mLs (10 mcg total) into the skin 2 (two) times daily with a meal. 30 mL 1  . Insulin Glargine (LANTUS SOLOSTAR) 100 UNIT/ML Solostar Pen Inject 45 Units into the skin at bedtime. 5 pen 5  . Insulin Pen Needle (BD PEN NEEDLE NANO U/F) 32G X 4 MM MISC Use to inject insulin 4 times daily. 150 each 11  . lisinopril-hydrochlorothiazide (ZESTORETIC) 20-12.5 MG tablet Take 2 tablets by mouth daily. 60 tablet 5  . nitroGLYCERIN (NITROSTAT) 0.4 MG SL tablet Place 0.4 mg under the tongue every 5 (five) minutes as needed for chest pain.    Marland Kitchen gabapentin (NEURONTIN) 300 MG capsule Take 1 capsule (300 mg total) by mouth 2 (two) times daily. 60 capsule 5  . estradiol (ESTRING) 2 MG vaginal ring Place 2 mg vaginally every 3 (three) months. follow package directions (Patient not taking: Reported on 05/06/2017) 1 each 12  . Glecaprevir-Pibrentasvir (MAVYRET) 100-40 MG TABS Take 3 tablets by mouth daily with breakfast. (Patient not taking: Reported on 05/06/2017) 84 tablet 0  . glucose blood (ACCU-CHEK AVIVA PLUS)  test strip Use to test blood sugar 3 times daily as instructed. Dx code: E11.319 (Patient not taking: Reported on 05/06/2017) 100 each 11  . insulin aspart (NOVOLOG FLEXPEN) 100 UNIT/ML FlexPen 5 units subcutaneously before breakfast and lunch and 10 units before dinner (Patient not taking: Reported on 06/16/2017) 3 pen 5  . progesterone (PROMETRIUM) 100 MG capsule Take 1 capsule (100 mg total) by mouth daily. (Patient not taking: Reported on 02/01/2017) 30 capsule 3   Facility-Administered Medications Prior to Visit  Medication Dose Route Frequency  Provider Last Rate Last Dose  . 0.9 %  sodium chloride infusion  500 mL Intravenous Continuous Irene Shipper, MD        ROS Review of Systems  Constitutional: Negative for activity change, appetite change and fatigue.  HENT: Negative for congestion, sinus pressure and sore throat.   Eyes: Negative for visual disturbance.  Respiratory: Positive for chest tightness. Negative for cough, shortness of breath and wheezing.   Cardiovascular: Negative for chest pain and palpitations.  Gastrointestinal: Negative for abdominal distention, abdominal pain and constipation.  Endocrine: Negative for polydipsia.  Genitourinary: Positive for dyspareunia. Negative for dysuria and frequency.  Musculoskeletal: Negative for arthralgias and back pain.  Skin: Negative for rash.  Neurological: Positive for numbness. Negative for tremors and light-headedness.  Hematological: Does not bruise/bleed easily.  Psychiatric/Behavioral: Negative for agitation and behavioral problems.    Objective:  BP (!) 151/91   Pulse 93   Temp (!) 97.5 F (36.4 C) (Oral)   Ht 5\' 6"  (1.676 m)   Wt 221 lb (100.2 kg)   LMP 04/01/2010   SpO2 92%   BMI 35.67 kg/m   BP/Weight 06/16/2017 05/06/2017 02/25/9832  Systolic BP 825 053 976  Diastolic BP 91 85 90  Wt. (Lbs) 221 219.4 212.6  BMI 35.67 35.41 34.31      Physical Exam Constitutional: normal appearing,  Eyes: PERRLA HEENT: Head is atraumatic, normal sinuses, normal oropharynx, normal appearing tonsils and palate, tympanic membrane is normal bilaterally. Neck: normal range of motion, no thyromegaly, no JVD Cardiovascular: normal rate and rhythm, normal heart sounds, no murmurs, rub or gallop, no pedal edema Respiratory: clear to auscultation bilaterally, no wheezes, no rales, no rhonchi Abdomen: soft, not tender to palpation, normal bowel sounds, no enlarged organs Extremities: Full ROM, no tenderness in joints Skin: warm and dry, no lesions. Neurological: alert,  oriented x3, cranial nerves I-XII grossly intact , normal motor strength, normal sensation. Psychological: normal mood.    Lab Results  Component Value Date   HGBA1C 14.7 04/12/2017    Assessment & Plan:   1. 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 NovoLog 25 units administered Her home blood sugar log reveals improvement since onset of Byetta with most numbers in the 100 A1c at next visit Diabetic diet - POCT glucose (manual entry) - insulin aspart (novoLOG) injection 25 Units  2. Dyspareunia in female Previously prescribed estrogen ring which was not covered by insurance Advised to get in touch with GYN regarding alternative which is covered  3. Essential hypertension Uncontrolled due to not taking antihypertensives this morning Low sodium, DASH diet  4. Other diabetic neurological complication associated with type 2 diabetes mellitus ([place]) Controlled on gabapentin Substituted with Lyrica - pregabalin (LYRICA) 75 MG capsule; Take 1 capsule (75 mg total) by  mouth 2 (two) times daily.  Dispense: 60 capsule; Refill: 3  5. Other chest pain Atypical EKG reassuring Advised to present to the ED if symptoms persist.   Meds ordered this encounter  Medications  . insulin aspart (novoLOG) injection 25 Units  . pregabalin (LYRICA) 75 MG capsule    Sig: Take 1 capsule (75 mg total) by mouth 2 (two) times daily.    Dispense:  60 capsule    Refill:  3    Discontinue gabapentin    Follow-up: Return in about 6 weeks (around 07/28/2017) for follow up on diabetes mellitus.   Arnoldo Morale MD

## 2017-06-16 NOTE — Progress Notes (Signed)
Patient came by office stating her estrogen ring fell out after 1.5 months of wearing it. Patient is stating that insurance is now not paying for it. Asked patient if they are paying for it because it's too soon. Patient states she doesn't know. Told patient I will contact the pharmacy & Dr Ilda Basset. Patient verbalized understanding & had no questions at this time

## 2017-06-16 NOTE — Patient Instructions (Signed)
Dyspareunia, Female Dyspareunia is pain that is associated with sexual activity. This can affect any part of the genitals or lower abdomen, and there are many possible causes. This condition ranges from mild to severe. Depending on the cause, dyspareunia may get better with treatment, or it may return (recur) over time. What are the causes? The cause of this condition is not always known. Possible causes include:  Cancer.  Psychological factors, such as depression, anxiety, or previous traumatic experiences.  Severe pain and tenderness of the skin around the vagina (vulva) when it is touched (vulvar vestibulitis syndrome).  Infection of the pelvis or the vulva.  Infection of the vagina.  Painful, involuntary tightening (contraction) of the vaginal muscles when anything is put inside the vagina (vaginismus).  Allergic reaction.  Ovarian cysts.  Solid growths of tissue (tumors) in the ovaries or the uterus.  Scar tissue in the ovaries, vagina, or pelvis.  Vaginal dryness.  Thinning of the tissue (atrophy) of the vulva and vagina.  Skin conditions that affect the vulva (vulvar dermatoses), such as lichen sclerosus or lichen planus.  Endometriosis.  Tubal pregnancy.  A tilted uterus.  Uterine prolapse.  Adhesions in the vagina.  Bladder problems.  Intestinal problems.  Certain medicines.  Medical conditions such as diabetes, arthritis, or thyroid disease.  What increases the risk? The following factors may make you more likely to develop this condition:  Having experienced physical or sexual trauma.  Having given birth more than once.  Taking birth control pills.  Having gone through menopause.  Having recently given birth, typically within the past 3-6 months.  Breastfeeding.  What are the signs or symptoms? The main symptom of this condition is pain in any part of the genitals or lower abdomen during or after sexual activity. This may include pain  during sexual arousal, genital stimulation, or orgasm. Pain may get worse when anything is inserted into the vagina, or when the genitals are touched in any way, such as when sitting or wearing pants. Pain can range from mild to severe, depending on the cause of the condition. In some cases, symptoms go away with treatment and return (recur) at a later date. How is this diagnosed? This condition may be diagnosed based on:  Your symptoms, including: ? Where your pain is located. ? When your pain occurs.  Your medical history.  A physical exam. This may include a pelvic exam and a Pap test. This is a screening test that is used to check for signs of cancer of the vagina, cervix, and uterus.  Tests, including: ? Blood tests. ? Ultrasound. This uses sound waves to make a picture of the area that is being tested. ? Urine culture. This test involves checking a urine sample for signs of infection. ? Culture test. This is when your health care provider uses a swab to collect a sample of vaginal fluid. The sample is checked for signs of infection. ? X-rays. ? MRI. ? CT scan. ? Laparoscopy. This is a procedure in which a small incision is made in your lower abdomen and a lighted, pencil-sized instrument (laparoscope) is passed through the incision and used to look inside your pelvis.  You may be referred to a health care provider who specializes in women's health (gynecologist). In some cases, diagnosing the cause of dyspareunia can be difficult. How is this treated? Treatment depends on the cause of your condition and your symptoms. In most cases, you may need to stop sexual activity until your symptoms   improve. Treatment may include:  Lubricants.  Kegel exercises or vaginal dilators.  Medicated skin creams.  Medicated vaginal creams.  Hormonal therapy.  Antibiotic medicine to prevent or fight infection.  Medicines that help to relieve pain.  Medicines that treat depression  (antidepressants).  Psychological counseling.  Sex therapy.  Surgery.  Follow these instructions at home: Lifestyle  Avoid tight clothing and irritating materials around your genital and abdominal area.  Use water-based lubricants as needed. Avoid oil-based lubricants.  Do not use any products that irritate you. This may include certain condoms, spermicides, lubricants, soaps, tampons, vaginal sprays, or douches.  Always practice safe sex. Talk with your health care provider about which form of birth control (contraception) is best for you.  Maintain open communication with your sexual partner. General instructions  Take over-the-counter and prescription medicines only as told by your health care provider.  If you had tests done, it is your responsibility to get your tests results. Ask your health care provider or the department performing the test when your results will be ready.  Urinate before you engage in sexual activity.  Consider joining a support group.  Keep all follow-up visits as told by your health care provider. This is important. Contact a health care provider if:  You develop vaginal bleeding after sexual intercourse.  You develop a lump at the opening of your vagina. Seek medical care even if the lump is painless.  You have: ? Abnormal vaginal discharge. ? Vaginal dryness. ? Itchiness or irritation of your vulva or vagina. ? A new rash. ? Symptoms that get worse or do not improve with treatment. ? A fever. ? Pain when you urinate. ? Blood in your urine. Get help right away if:  You develop severe pain in your abdomen during or shortly after sexual intercourse.  You pass out after having sexual intercourse. This information is not intended to replace advice given to you by your health care provider. Make sure you discuss any questions you have with your health care provider. Document Released: 07/25/2007 Document Revised: 11/14/2015 Document  Reviewed: 02/04/2015 Elsevier Interactive Patient Education  2018 [employer]  

## 2017-08-16 ENCOUNTER — Encounter: Payer: Self-pay | Admitting: Family Medicine

## 2017-08-16 ENCOUNTER — Encounter: Payer: Self-pay | Admitting: Pharmacist

## 2017-08-16 ENCOUNTER — Ambulatory Visit: Payer: Medicaid Other | Attending: Family Medicine | Admitting: Family Medicine

## 2017-08-16 VITALS — BP 135/84 | HR 85 | Temp 97.4°F | Ht 66.0 in | Wt 219.4 lb

## 2017-08-16 DIAGNOSIS — Z8619 Personal history of other infectious and parasitic diseases: Secondary | ICD-10-CM | POA: Insufficient documentation

## 2017-08-16 DIAGNOSIS — E11311 Type 2 diabetes mellitus with unspecified diabetic retinopathy with macular edema: Secondary | ICD-10-CM

## 2017-08-16 DIAGNOSIS — Z79899 Other long term (current) drug therapy: Secondary | ICD-10-CM | POA: Diagnosis not present

## 2017-08-16 DIAGNOSIS — Z794 Long term (current) use of insulin: Secondary | ICD-10-CM | POA: Diagnosis not present

## 2017-08-16 DIAGNOSIS — E1149 Type 2 diabetes mellitus with other diabetic neurological complication: Secondary | ICD-10-CM | POA: Diagnosis not present

## 2017-08-16 DIAGNOSIS — E1140 Type 2 diabetes mellitus with diabetic neuropathy, unspecified: Secondary | ICD-10-CM | POA: Diagnosis not present

## 2017-08-16 DIAGNOSIS — Z9111 Patient's noncompliance with dietary regimen: Secondary | ICD-10-CM | POA: Diagnosis not present

## 2017-08-16 DIAGNOSIS — R197 Diarrhea, unspecified: Secondary | ICD-10-CM | POA: Insufficient documentation

## 2017-08-16 DIAGNOSIS — E7800 Pure hypercholesterolemia, unspecified: Secondary | ICD-10-CM | POA: Insufficient documentation

## 2017-08-16 DIAGNOSIS — I10 Essential (primary) hypertension: Secondary | ICD-10-CM

## 2017-08-16 DIAGNOSIS — Z9119 Patient's noncompliance with other medical treatment and regimen: Secondary | ICD-10-CM | POA: Diagnosis not present

## 2017-08-16 LAB — GLUCOSE, POCT (MANUAL RESULT ENTRY): POC Glucose: 427 mg/dl — AB (ref 70–99)

## 2017-08-16 LAB — POCT GLYCOSYLATED HEMOGLOBIN (HGB A1C): HEMOGLOBIN A1C: 12.3

## 2017-08-16 MED ORDER — AMLODIPINE BESYLATE 10 MG PO TABS: 10.0000 mg | ORAL_TABLET | Freq: Every day | ORAL | 5 refills | Status: DC

## 2017-08-16 MED ORDER — LISINOPRIL-HYDROCHLOROTHIAZIDE 20-12.5 MG PO TABS: 2.0000 | ORAL_TABLET | Freq: Every day | ORAL | 5 refills | Status: DC

## 2017-08-16 MED ORDER — PREGABALIN 75 MG PO CAPS: 75.0000 mg | ORAL_CAPSULE | Freq: Two times a day (BID) | ORAL | 3 refills | Status: DC

## 2017-08-16 MED ORDER — LIRAGLUTIDE 18 MG/3ML ~~LOC~~ SOPN: 1.8000 mg | PEN_INJECTOR | Freq: Every day | SUBCUTANEOUS | 3 refills | Status: DC

## 2017-08-16 MED ORDER — ATORVASTATIN CALCIUM 20 MG PO TABS: 20.0000 mg | ORAL_TABLET | Freq: Every day | ORAL | 5 refills | Status: DC

## 2017-08-16 MED ORDER — INSULIN GLARGINE 100 UNIT/ML SOLOSTAR PEN: 50.0000 [IU] | PEN_INJECTOR | Freq: Every day | SUBCUTANEOUS | 5 refills | Status: DC

## 2017-08-16 MED ORDER — INSULIN ASPART 100 UNIT/ML ~~LOC~~ SOLN
10.0000 [IU] | Freq: Once | SUBCUTANEOUS | Status: AC
  Administered 2017-08-16: 10 [IU] via SUBCUTANEOUS

## 2017-08-16 MED ORDER — DIPHENOXYLATE-ATROPINE 2.5-0.025 MG PO TABS: 1.0000 | ORAL_TABLET | Freq: Four times a day (QID) | ORAL | 0 refills | Status: DC | PRN

## 2017-08-16 NOTE — Progress Notes (Signed)
PA submitted for [employer]. Pending approval by [facility] Medicaid

## 2017-08-16 NOTE — Patient Instructions (Signed)
Diarrhea, Adult °Diarrhea is when you have loose and water poop (stool) often. Diarrhea can make you feel weak and cause you to get dehydrated. Dehydration can make you tired and thirsty, make you have a dry mouth, and make it so you pee (urinate) less often. Diarrhea often lasts 2-3 days. However, it can last longer if it is a sign of something more serious. It is important to treat your diarrhea as told by your doctor. °Follow these instructions at home: °Eating and drinking ° °Follow these recommendations as told by your doctor: °· Take an oral rehydration solution (ORS). This is a drink that is sold at pharmacies and stores. °· Drink clear fluids, such as: °? Water. °? Ice chips. °? Diluted fruit juice. °? Low-calorie sports drinks. °· Eat bland, easy-to-digest foods in small amounts as you are able. These foods include: °? Bananas. °? Applesauce. °? Rice. °? Low-fat (lean) meats. °? Toast. °? Crackers. °· Avoid drinking fluids that have a lot of sugar or caffeine in them. °· Avoid alcohol. °· Avoid spicy or fatty foods. ° °General instructions ° °· Drink enough fluid to keep your pee (urine) clear or pale yellow. °· Wash your hands often. If you cannot use soap and water, use hand sanitizer. °· Make sure that all people in your home wash their hands well and often. °· Take over-the-counter and prescription medicines only as told by your doctor. °· Rest at home while you get better. °· Watch your condition for any changes. °· Take a warm bath to help with any burning or pain from having diarrhea. °· Keep all follow-up visits as told by your doctor. This is important. °Contact a doctor if: °· You have a fever. °· Your diarrhea gets worse. °· You have new symptoms. °· You cannot keep fluids down. °· You feel light-headed or dizzy. °· You have a headache. °· You have muscle cramps. °Get help right away if: °· You have chest pain. °· You feel very weak or you pass out (faint). °· You have bloody or black poop or  poop that look like tar. °· You have very bad pain, cramping, or bloating in your belly (abdomen). °· You have trouble breathing or you are breathing very quickly. °· Your heart is beating very quickly. °· Your skin feels cold and clammy. °· You feel confused. °· You have signs of dehydration, such as: °? Dark pee, hardly any pee, or no pee. °? Cracked lips. °? Dry mouth. °? Sunken eyes. °? Sleepiness. °? Weakness. °This information is not intended to replace advice given to you by your health care provider. Make sure you discuss any questions you have with your health care provider. °Document Released: 12/22/2007 Document Revised: 01/23/2016 Document Reviewed: 03/11/2015 °Elsevier Interactive Patient Education © 2018 [employer] ° °

## 2017-08-16 NOTE — Progress Notes (Signed)
Subjective:  Patient ID: [name] [name], female    DOB: [birth year]  Age: 57 y.o. MRN: [id number]  CC: Diarrhea and Diabetes   HPI [name] is a 57 year old female with a history of type 2 diabetes mellitus (A1c 12.3), diabetic neuropathy hypertension, diabetic retinopathy, hyperlipidemia, hepatitis C (completed treatment with harvoni) who presents today for a follow-up visit.  She complains of a one-week history of diarrhea which occurs 3-4 times a day and has had to wear depends due to her symptoms as she is unable to hold it and has had accidents. Diarrhea  is preceded by abdominal cramping but she denies nausea, vomiting or fever or history of sick contacts and has not used any OTC medications.  Her blood sugar in the clinic is 427 and she endorses not taking her medication this morning as she was in a hurry to get to her appointment.   Despite compliance with Byetta and 45 units of Lantus her A1c is still 12.3; she has not been taking NovoLog. She has not been compliant with a diabetic diet or exercise. She is closely followed by ophthalmology due to her history of diabetic retinopathy.  Tolerates her antihypertensive and her statin and denies adverse effects or myalgias.  Past Medical History:  Diagnosis Date  . Anemia   . Chronic hepatitis C without hepatic coma ([place]) 11/09/2016  . Diabetes mellitus   . Fibroids   . 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 Hypertension   . MRSA (methicillin resistant Staphylococcus aureus)   . Trichomonas   . VAGINITIS, BACTERIAL, RECURRENT 08/15/2007   Qualifier: Diagnosis of  By: Radene Ou MD, [place]      Past Surgical History:  Procedure Laterality Date  . CESAREAN SECTION     breech    No Known Allergies   Outpatient Medications Prior to Visit  Medication Sig Dispense Refill  . ACCU-CHEK SOFTCLIX LANCETS lancets Use to test blood sugar 3 times daily as instructed. Dx code: 250.50 100 each 11  .  albuterol (PROVENTIL HFA;VENTOLIN HFA) 108 (90 Base) MCG/ACT inhaler Inhale 2 puffs into the lungs every 6 (six) hours as needed for wheezing or shortness of breath. 1 Inhaler 3  . Blood Glucose Monitoring Suppl (ACCU-CHEK AVIVA) device Use as instructed daily. 1 each 0  . estradiol (ESTRING) 2 MG vaginal ring Place 2 mg vaginally every 3 (three) months. follow package directions 1 each 12  . Insulin Pen Needle (BD PEN NEEDLE NANO U/F) 32G X 4 MM MISC Use to inject insulin 4 times daily. 150 each 11  . nitroGLYCERIN (NITROSTAT) 0.4 MG SL tablet Place 0.4 mg under the tongue every 5 (five) minutes as needed for chest pain.    Marland Kitchen amLODipine (NORVASC) 10 MG tablet Take 1 tablet (10 mg total) by mouth daily. 30 tablet 5  . atorvastatin (LIPITOR) 20 MG tablet Take 1 tablet (20 mg total) by mouth daily. 30 tablet 5  . exenatide (BYETTA 10 MCG PEN) 10 MCG/0.04ML SOPN injection Inject 0.04 mLs (10 mcg total) into the skin 2 (two) times daily with a meal. 30 mL 1  . Insulin Glargine (LANTUS SOLOSTAR) 100 UNIT/ML Solostar Pen Inject 45 Units into the skin at bedtime. 5 pen 5  . lisinopril-hydrochlorothiazide (ZESTORETIC) 20-12.5 MG tablet Take 2 tablets by mouth daily. 60 tablet 5  . pregabalin (LYRICA) 75 MG capsule Take 1 capsule (75 mg total) by mouth 2 (two) times daily. 60 capsule 3  .  glucose blood (ACCU-CHEK AVIVA PLUS) test strip Use to test blood sugar 3 times daily as instructed. Dx code: E11.319 (Patient not taking: Reported on 05/06/2017) 100 each 11  . progesterone (PROMETRIUM) 100 MG capsule Take 1 capsule (100 mg total) by mouth daily. (Patient not taking: Reported on 02/01/2017) 30 capsule 3  . Glecaprevir-Pibrentasvir (MAVYRET) 100-40 MG TABS Take 3 tablets by mouth daily with breakfast. (Patient not taking: Reported on 05/06/2017) 84 tablet 0  . insulin aspart (NOVOLOG FLEXPEN) 100 UNIT/ML FlexPen 5 units subcutaneously before breakfast and lunch and 10 units before dinner (Patient not taking:  Reported on 06/16/2017) 3 pen 5   Facility-Administered Medications Prior to Visit  Medication Dose Route Frequency Provider Last Rate Last Dose  . 0.9 %  sodium chloride infusion  500 mL Intravenous Continuous Irene Shipper, MD        ROS Review of Systems  Constitutional: Negative for activity change, appetite change and fatigue.  HENT: Negative for congestion, sinus pressure and sore throat.   Eyes: Negative for visual disturbance.  Respiratory: Negative for cough, chest tightness, shortness of breath and wheezing.   Cardiovascular: Negative for chest pain and palpitations.  Gastrointestinal: Positive for diarrhea. Negative for abdominal distention, abdominal pain and constipation.  Endocrine: Negative for polydipsia.  Genitourinary: Negative for dysuria and frequency.  Musculoskeletal: Negative for arthralgias and back pain.  Skin: Negative for rash.  Neurological: Negative for tremors, light-headedness and numbness.  Hematological: Does not bruise/bleed easily.  Psychiatric/Behavioral: Negative for agitation and behavioral problems.    Objective:  BP 135/84   Pulse 85   Temp (!) 97.4 F (36.3 C) (Oral)   Ht '5\' 6"'  (1.676 m)   Wt 219 lb 6.4 oz (99.5 kg)   LMP 04/01/2010   SpO2 98%   BMI 35.41 kg/m   BP/Weight 08/16/2017 06/16/2017 83/38/2505  Systolic BP 397 673 419  Diastolic BP 84 91 85  Wt. (Lbs) 219.4 221 219.4  BMI 35.41 35.67 35.41      Physical Exam  Constitutional: She is oriented to person, place, and time. She appears well-developed and well-nourished.  Cardiovascular: Normal rate, normal heart sounds and intact distal pulses.  No murmur heard. Pulmonary/Chest: Effort normal and breath sounds normal. She has no wheezes. She has no rales. She exhibits no tenderness.  Abdominal: Soft. Bowel sounds are normal. She exhibits no distension and no mass. There is no tenderness.  Musculoskeletal: Normal range of motion.  Neurological: She is alert and oriented  to person, place, and time.  Skin: Skin is warm and dry.  Psychiatric: She has a normal mood and affect.     CMP Latest Ref Rng & Units 04/04/2017 03/03/2017 01/18/2017  Glucose 65 - 99 mg/dL 464(H) 396(H) 204(H)  BUN 6 - 20 mg/dL 17 26(H) 9  Creatinine 0.44 - 1.00 mg/dL 0.96 1.03(H) 0.70  Sodium 135 - 145 mmol/L 132(L) 133(L) 139  Potassium 3.5 - 5.1 mmol/L 4.5 4.0 3.4(L)  Chloride 101 - 111 mmol/L 95(L) 100(L) 106  CO2 22 - 32 mmol/L '26 28 25  ' Calcium 8.9 - 10.3 mg/dL 9.5 8.8(L) 8.6(L)  Total Protein 6.0 - 8.5 g/dL - - -  Total Bilirubin 0.0 - 1.2 mg/dL - - -  Alkaline Phos 39 - 117 IU/L - - -  AST 0 - 40 IU/L - - -  ALT 6 - 29 U/L - - -    Lipid Panel     Component Value Date/Time   CHOL 172 11/08/2016 1427  TRIG 100 11/08/2016 1427   HDL 80 11/08/2016 1427   CHOLHDL 2.2 11/08/2016 1427   CHOLHDL 3 06/18/2014 0951   VLDL 19.6 06/18/2014 0951   LDLCALC 72 11/08/2016 1427    Lab Results  Component Value Date   HGBA1C 12.3 08/16/2017    Assessment & Plan:   1. 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 It is recommended that you undergo annual eye exams and annual foot exams. Pneumonia vaccine is recommended. - POCT glucose (manual entry) - POCT glycosylated hemoglobin (Hb A1C) - CMP14+EGFR - Lipid panel - Microalbumin/Creatinine Ratio, Urine - liraglutide (VICTOZA) 18 MG/3ML SOPN; Inject 0.3 mLs (1.8 mg total) into the skin daily with  breakfast. Start with 0.22m for 1 week then 0.2 mL for 1 week then 0.3 mL thereafter.  Dispense: 30 mL; Refill: 3 - insulin aspart (novoLOG) injection 10 Units  2. Other diabetic neurological complication associated with type 2 diabetes mellitus (HCC) Stable - pregabalin (LYRICA) 75 MG capsule; Take 1 capsule (75 mg total) by mouth 2 (two) times daily.  Dispense: 60 capsule; Refill: 3  3. Essential hypertension Controlled - amLODipine (NORVASC) 10 MG tablet; Take 1 tablet (10 mg total) by mouth daily.  Dispense: 30 tablet; Refill: 5 - lisinopril-hydrochlorothiazide (ZESTORETIC) 20-12.5 MG tablet; Take 2 tablets by mouth daily.  Dispense: 60 tablet; Refill: 5  4. Pure hypercholesterolemia Controlled Low-cholesterol diet - atorvastatin (LIPITOR) 20 MG tablet; Take 1 tablet (20 mg total) by mouth daily.  Dispense: 30 tablet; Refill: 5  5. Type 2 diabetes mellitus with other neurologic complication, with long-term current use of insulin (HCC) - Insulin Glargine (LANTUS SOLOSTAR) 100 UNIT/ML Solostar Pen; Inject 50 Units into the skin at bedtime.  Dispense: 5 pen; Refill: 5  6. Diarrhea, unspecified type We will treat for gastroenteritis Advised to increase fluid intake, BRAT diet - diphenoxylate-atropine (LOMOTIL) 2.5-0.025 MG tablet; Take 1 tablet by mouth 4 (four) times daily as needed for diarrhea or loose stools.  Dispense: 30 tablet; Refill: 0   Meds ordered this encounter  Medications  . diphenoxylate-atropine (LOMOTIL) 2.5-0.025 MG tablet    Sig: Take 1 tablet by mouth 4 (four) times daily as needed for diarrhea or loose stools.    Dispense:  30 tablet    Refill:  0  . pregabalin (LYRICA) 75 MG capsule    Sig: Take 1 capsule (75 mg total) by mouth 2 (two) times daily.    Dispense:  60 capsule    Refill:  3    Discontinue gabapentin  . liraglutide (VICTOZA) 18 MG/3ML SOPN    Sig: Inject 0.3 mLs (1.8 mg total) into the skin daily with breakfast. Start with 0.165mfor 1 week then  0.2 mL for 1 week then 0.3 mL thereafter.    Dispense:  30 mL    Refill:  3    Discontinue Byetta  . amLODipine (NORVASC) 10 MG tablet    Sig: Take 1 tablet (10 mg total) by mouth daily.    Dispense:  30 tablet    Refill:  5  . atorvastatin (LIPITOR) 20 MG tablet    Sig: Take 1 tablet (20 mg total) by mouth daily.    Dispense:  30 tablet    Refill:  5  . Insulin Glargine (LANTUS SOLOSTAR) 100 UNIT/ML Solostar Pen    Sig: Inject 50 Units into the skin at bedtime.    Dispense:  5 pen    Refill:  5    Discontinue previous dose  . lisinopril-hydrochlorothiazide (ZESTORETIC) 20-12.5 MG tablet    Sig: Take 2 tablets by mouth daily.    Dispense:  60 tablet    Refill:  5    Discontinue previous dose  . insulin aspart (novoLOG) injection 10 Units    Follow-up: Return in about 1 month (around 09/15/2017) for Follow up of diabetes mellitus.   [name] MD

## 2017-08-17 LAB — MICROALBUMIN / CREATININE URINE RATIO
CREATININE, UR: 74.5 mg/dL
MICROALB/CREAT RATIO: 2056.8 mg/g{creat} — AB (ref 0.0–30.0)
MICROALBUM., U, RANDOM: 1532.3 ug/mL

## 2017-08-17 LAB — CMP14+EGFR
A/G RATIO: 1.2 (ref 1.2–2.2)
ALBUMIN: 4 g/dL (ref 3.5–5.5)
ALT: 13 IU/L (ref 0–32)
AST: 11 IU/L (ref 0–40)
Alkaline Phosphatase: 92 IU/L (ref 39–117)
BUN / CREAT RATIO: 20 (ref 9–23)
BUN: 22 mg/dL (ref 6–24)
Bilirubin Total: 0.3 mg/dL (ref 0.0–1.2)
CALCIUM: 9.2 mg/dL (ref 8.7–10.2)
CO2: 18 mmol/L — AB (ref 20–29)
CREATININE: 1.09 mg/dL — AB (ref 0.57–1.00)
Chloride: 101 mmol/L (ref 96–106)
GFR calc Af Amer: 66 mL/min/{1.73_m2} (ref >59)
GFR, EST NON AFRICAN AMERICAN: 57 mL/min/{1.73_m2} — AB (ref >59)
GLOBULIN, TOTAL: 3.3 g/dL (ref 1.5–4.5)
Glucose: 447 mg/dL — ABNORMAL HIGH (ref 65–99)
POTASSIUM: 4.6 mmol/L (ref 3.5–5.2)
SODIUM: 137 mmol/L (ref 134–144)
Total Protein: 7.3 g/dL (ref 6.0–8.5)

## 2017-08-17 LAB — LIPID PANEL
CHOL/HDL RATIO: 2.4 ratio (ref 0.0–4.4)
Cholesterol, Total: 158 mg/dL (ref 100–199)
HDL: 65 mg/dL (ref >39)
LDL CALC: 74 mg/dL (ref 0–99)
Triglycerides: 93 mg/dL (ref 0–149)
VLDL Cholesterol Cal: 19 mg/dL (ref 5–40)

## 2017-08-22 ENCOUNTER — Telehealth: Payer: Self-pay

## 2017-08-22 NOTE — Telephone Encounter (Signed)
Patient was called and informed of lab results. 

## 2017-09-21 ENCOUNTER — Encounter (HOSPITAL_COMMUNITY): Payer: Self-pay | Admitting: Emergency Medicine

## 2017-09-21 ENCOUNTER — Other Ambulatory Visit: Payer: Self-pay

## 2017-09-21 ENCOUNTER — Emergency Department (HOSPITAL_COMMUNITY): Payer: Medicaid Other

## 2017-09-21 ENCOUNTER — Telehealth: Payer: Self-pay | Admitting: Family Medicine

## 2017-09-21 DIAGNOSIS — E1165 Type 2 diabetes mellitus with hyperglycemia: Secondary | ICD-10-CM | POA: Insufficient documentation

## 2017-09-21 DIAGNOSIS — Z79899 Other long term (current) drug therapy: Secondary | ICD-10-CM | POA: Insufficient documentation

## 2017-09-21 DIAGNOSIS — R079 Chest pain, unspecified: Secondary | ICD-10-CM | POA: Diagnosis not present

## 2017-09-21 DIAGNOSIS — R1010 Upper abdominal pain, unspecified: Secondary | ICD-10-CM | POA: Diagnosis not present

## 2017-09-21 DIAGNOSIS — Z794 Long term (current) use of insulin: Secondary | ICD-10-CM | POA: Diagnosis not present

## 2017-09-21 DIAGNOSIS — I10 Essential (primary) hypertension: Secondary | ICD-10-CM | POA: Diagnosis not present

## 2017-09-21 DIAGNOSIS — K5289 Other specified noninfective gastroenteritis and colitis: Secondary | ICD-10-CM | POA: Insufficient documentation

## 2017-09-21 DIAGNOSIS — K529 Noninfective gastroenteritis and colitis, unspecified: Secondary | ICD-10-CM | POA: Diagnosis not present

## 2017-09-21 DIAGNOSIS — R112 Nausea with vomiting, unspecified: Secondary | ICD-10-CM | POA: Diagnosis not present

## 2017-09-21 LAB — BASIC METABOLIC PANEL
Anion gap: 16 — ABNORMAL HIGH (ref 5–15)
BUN: 22 mg/dL — ABNORMAL HIGH (ref 6–20)
CHLORIDE: 91 mmol/L — AB (ref 101–111)
CO2: 27 mmol/L (ref 22–32)
CREATININE: 1.57 mg/dL — AB (ref 0.44–1.00)
Calcium: 9.1 mg/dL (ref 8.9–10.3)
GFR calc non Af Amer: 36 mL/min — ABNORMAL LOW (ref >60)
GFR, EST AFRICAN AMERICAN: 41 mL/min — AB (ref >60)
Glucose, Bld: 418 mg/dL — ABNORMAL HIGH (ref 65–99)
POTASSIUM: 3.9 mmol/L (ref 3.5–5.1)
SODIUM: 134 mmol/L — AB (ref 135–145)

## 2017-09-21 LAB — CBC
HEMATOCRIT: 37.8 % (ref 36.0–46.0)
HEMOGLOBIN: 12.3 g/dL (ref 12.0–15.0)
MCH: 27 pg (ref 26.0–34.0)
MCHC: 32.5 g/dL (ref 30.0–36.0)
MCV: 82.9 fL (ref 78.0–100.0)
PLATELETS: 241 10*3/uL (ref 150–400)
RBC: 4.56 MIL/uL (ref 3.87–5.11)
RDW: 13.1 % (ref 11.5–15.5)
WBC: 4.7 10*3/uL (ref 4.0–10.5)

## 2017-09-21 LAB — I-STAT TROPONIN, ED: Troponin i, poc: 0 ng/mL (ref 0.00–0.08)

## 2017-09-21 LAB — I-STAT BETA HCG BLOOD, ED (MC, WL, AP ONLY)

## 2017-09-21 NOTE — Telephone Encounter (Signed)
Pt called to inform the pcp that all the medication is making her sick, vomiting, she want to know if she still taking the medication until see her on 09/22/17, please follow up

## 2017-09-21 NOTE — ED Triage Notes (Signed)
Pt c/o nausea/vomiting that started yesterday. Pt states she is unable to keep any food down. Pt began having non-radiating chest pain that started this morning. Denies shortness of breath. Denies chest pain at this time.

## 2017-09-22 ENCOUNTER — Encounter: Payer: Self-pay | Admitting: Family Medicine

## 2017-09-22 ENCOUNTER — Emergency Department (HOSPITAL_COMMUNITY)
Admission: EM | Admit: 2017-09-22 | Discharge: 2017-09-22 | Disposition: A | Discharge status: Home / Self Care | Payer: Medicaid Other | Attending: Emergency Medicine | Admitting: Emergency Medicine

## 2017-09-22 ENCOUNTER — Ambulatory Visit: Payer: Medicaid Other | Attending: Family Medicine | Admitting: Family Medicine

## 2017-09-22 VITALS — BP 144/89 | HR 84 | Temp 97.4°F | Ht 66.0 in | Wt 217.4 lb

## 2017-09-22 DIAGNOSIS — I10 Essential (primary) hypertension: Secondary | ICD-10-CM | POA: Insufficient documentation

## 2017-09-22 DIAGNOSIS — Z79899 Other long term (current) drug therapy: Secondary | ICD-10-CM | POA: Insufficient documentation

## 2017-09-22 DIAGNOSIS — Z9889 Other specified postprocedural states: Secondary | ICD-10-CM | POA: Diagnosis not present

## 2017-09-22 DIAGNOSIS — Z794 Long term (current) use of insulin: Secondary | ICD-10-CM | POA: Diagnosis not present

## 2017-09-22 DIAGNOSIS — R739 Hyperglycemia, unspecified: Secondary | ICD-10-CM

## 2017-09-22 DIAGNOSIS — E1140 Type 2 diabetes mellitus with diabetic neuropathy, unspecified: Secondary | ICD-10-CM | POA: Insufficient documentation

## 2017-09-22 DIAGNOSIS — E11311 Type 2 diabetes mellitus with unspecified diabetic retinopathy with macular edema: Secondary | ICD-10-CM | POA: Insufficient documentation

## 2017-09-22 DIAGNOSIS — E785 Hyperlipidemia, unspecified: Secondary | ICD-10-CM | POA: Diagnosis not present

## 2017-09-22 DIAGNOSIS — B182 Chronic viral hepatitis C: Secondary | ICD-10-CM | POA: Insufficient documentation

## 2017-09-22 DIAGNOSIS — K529 Noninfective gastroenteritis and colitis, unspecified: Secondary | ICD-10-CM

## 2017-09-22 DIAGNOSIS — E1149 Type 2 diabetes mellitus with other diabetic neurological complication: Secondary | ICD-10-CM

## 2017-09-22 LAB — BASIC METABOLIC PANEL
ANION GAP: 12 (ref 5–15)
BUN: 27 mg/dL — ABNORMAL HIGH (ref 6–20)
CALCIUM: 8.9 mg/dL (ref 8.9–10.3)
CO2: 26 mmol/L (ref 22–32)
Chloride: 92 mmol/L — ABNORMAL LOW (ref 101–111)
Creatinine, Ser: 1.63 mg/dL — ABNORMAL HIGH (ref 0.44–1.00)
GFR, EST AFRICAN AMERICAN: 40 mL/min — AB (ref >60)
GFR, EST NON AFRICAN AMERICAN: 34 mL/min — AB (ref >60)
Glucose, Bld: 455 mg/dL — ABNORMAL HIGH (ref 65–99)
Potassium: 4.1 mmol/L (ref 3.5–5.1)
SODIUM: 130 mmol/L — AB (ref 135–145)

## 2017-09-22 LAB — GLUCOSE, POCT (MANUAL RESULT ENTRY): POC GLUCOSE: 383 mg/dL — AB (ref 70–99)

## 2017-09-22 LAB — CBG MONITORING, ED
GLUCOSE-CAPILLARY: 344 mg/dL — AB (ref 65–99)
Glucose-Capillary: 376 mg/dL — ABNORMAL HIGH (ref 65–99)

## 2017-09-22 MED ORDER — LIRAGLUTIDE 18 MG/3ML ~~LOC~~ SOPN: 1.8000 mg | PEN_INJECTOR | Freq: Every day | SUBCUTANEOUS | 3 refills | Status: DC

## 2017-09-22 MED ORDER — SODIUM CHLORIDE 0.9 % IV BOLUS (SEPSIS)
1000.0000 mL | Freq: Once | INTRAVENOUS | Status: AC
  Administered 2017-09-22: 1000 mL via INTRAVENOUS

## 2017-09-22 MED ORDER — PREGABALIN 75 MG PO CAPS: 75.0000 mg | ORAL_CAPSULE | Freq: Two times a day (BID) | ORAL | 3 refills | Status: DC

## 2017-09-22 MED ORDER — INSULIN ASPART 100 UNIT/ML ~~LOC~~ SOLN
8.0000 [IU] | Freq: Once | SUBCUTANEOUS | Status: AC
  Administered 2017-09-22: 8 [IU] via INTRAVENOUS
  Filled 2017-09-22: qty 1

## 2017-09-22 MED ORDER — ONDANSETRON 8 MG PO TBDP: ORAL_TABLET | ORAL | 0 refills | Status: DC

## 2017-09-22 MED ORDER — ONDANSETRON HCL 4 MG/2ML IJ SOLN
4.0000 mg | Freq: Once | INTRAMUSCULAR | Status: AC
  Administered 2017-09-22: 4 mg via INTRAVENOUS
  Filled 2017-09-22: qty 2

## 2017-09-22 MED ORDER — KETOROLAC TROMETHAMINE 30 MG/ML IJ SOLN
30.0000 mg | Freq: Once | INTRAMUSCULAR | Status: AC
  Administered 2017-09-22: 30 mg via INTRAVENOUS
  Filled 2017-09-22: qty 1

## 2017-09-22 MED ORDER — PROMETHAZINE HCL 25 MG RE SUPP: 25.0000 mg | Freq: Three times a day (TID) | RECTAL | 0 refills | Status: DC | PRN

## 2017-09-22 MED ORDER — DIPHENOXYLATE-ATROPINE 2.5-0.025 MG PO TABS: 1.0000 | ORAL_TABLET | Freq: Three times a day (TID) | ORAL | 0 refills | Status: DC

## 2017-09-22 NOTE — ED Provider Notes (Signed)
[facility] EMERGENCY DEPART[facility] Provider Note   CSN: [id number] Arrival date & time: 09/21/17  1920     History   Chief Complaint Chief Complaint  Patient presents with  . Emesis  . Chest Pain    HPI [name] [name] is a 57 y.o. female.  Patient is a 57 year old female with past medical history of diabetes, hypertension, fibroids.  She presents today for evaluation of nausea, vomiting, diarrhea, and upper abdominal discomfort that is been ongoing for the past 2 days.  She has had multiple episodes of vomiting and diarrhea, neither have been bloody.  She denies any fevers.  She does report a headache.  She denies ill contacts.  She has been unable to keep down her medications.   The history is provided by the patient.  Emesis   This is a new problem. The current episode started 2 days ago. The problem occurs continuously. The problem has been rapidly worsening. The emesis has an appearance of stomach contents. There has been no fever. Associated symptoms include diarrhea. Pertinent negatives include no chills.    Past Medical History:  Diagnosis Date  . Anemia   . Chronic hepatitis C without hepatic coma ([place]) 11/09/2016  . Diabetes mellitus   . Fibroids   . 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 Hypertension   . MRSA (methicillin resistant Staphylococcus aureus)   . Trichomonas   . VAGINITIS, BACTERIAL, RECURRENT 08/15/2007   Qualifier: Diagnosis of  By: Radene Ou MD, [place]      Patient Active Problem List   Diagnosis Date Noted  . Diabetic neuropathy ([place]) 06/16/2017  . Non compliance w medication regimen 04/12/2017  . Diarrhea 11/17/2016  . Family history of colon cancer in mother 11/17/2016  . Dyspareunia in female 11/11/2016  . Screen for colon cancer 11/11/2016  . History of ovarian cyst 11/11/2016  . Chronic hepatitis C without hepatic coma ([place]) 11/09/2016  . Hyperlipidemia 10/07/2016  . Type 2 diabetes  mellitus ([place]) 01/29/2014  . BREAST PAIN, BILATERAL 02/19/2010  . ANEMIA, IRON DEFICIENCY 10/03/2009  . LEUKOPENIA, MILD 09/19/2009  . CHEST PAIN UNSPECIFIED 09/19/2009  . LIPOMA 06/18/2009  . 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 TINEA PEDIS 05/07/2009  . SKIN LESION 05/07/2009  . COUGH 05/07/2009  . URI 04/04/2007  . SUBSTANCE ABUSE, MULTIPLE 02/22/2007  . Essential hypertension 02/22/2007    Past Surgical History:  Procedure Laterality Date  . CESAREAN SECTION     breech    OB History    Gravida Para Term Preterm AB Living   1 1 1     1    SAB TAB Ectopic Multiple Live Births           1       Home Medications    Prior to Admission medications   Medication Sig Start Date End Date Taking? Authorizing Provider  ACCU-CHEK SOFTCLIX LANCETS lancets Use to test blood sugar 3 times daily as instructed. Dx code: 250.50 10/07/16   [name], MD  albuterol (PROVENTIL HFA;VENTOLIN HFA) 108 (90 Base) MCG/ACT inhaler Inhale 2 puffs into the lungs every 6 (six) hours as needed for wheezing or shortness of breath. 04/12/17   [name], MD  amLODipine (NORVASC) 10 MG tablet Take 1 tablet (10 mg total) by mouth daily. 08/16/17   [name], MD  atorvastatin (LIPITOR) 20 MG tablet Take 1 tablet (20 mg total) by mouth daily. 08/16/17   Newlin, Charlane Ferretti,  MD  Blood Glucose Monitoring Suppl (ACCU-CHEK AVIVA) device Use as instructed daily. 10/07/16   [name], MD  diphenoxylate-atropine (LOMOTIL) 2.5-0.025 MG tablet Take 1 tablet by mouth 4 (four) times daily as needed for diarrhea or loose stools. 08/16/17   [name], MD  estradiol (ESTRING) 2 MG vaginal ring Place 2 mg vaginally every 3 (three) months. follow package directions 03/18/17   Aletha Halim, MD  glucose blood (ACCU-CHEK AVIVA PLUS) test strip Use to test blood sugar 3 times daily as instructed. Dx code: E11.319 Patient not taking: Reported on 05/06/2017 10/07/16   [name], MD  Insulin Glargine  (LANTUS SOLOSTAR) 100 UNIT/ML Solostar Pen Inject 50 Units into the skin at bedtime. 08/16/17   [name], MD  Insulin Pen Needle (BD PEN NEEDLE NANO U/F) 32G X 4 MM MISC Use to inject insulin 4 times daily. 11/08/16   [name], MD  liraglutide (VICTOZA) 18 MG/3ML SOPN Inject 0.3 mLs (1.8 mg total) into the skin daily with breakfast. Start with 0.72mL for 1 week then 0.2 mL for 1 week then 0.3 mL thereafter. 08/16/17   [name], MD  lisinopril-hydrochlorothiazide (ZESTORETIC) 20-12.5 MG tablet Take 2 tablets by mouth daily. 08/16/17   [name], MD  nitroGLYCERIN (NITROSTAT) 0.4 MG SL tablet Place 0.4 mg under the tongue every 5 (five) minutes as needed for chest pain.    [provider]  pregabalin (LYRICA) 75 MG capsule Take 1 capsule (75 mg total) by mouth 2 (two) times daily. 08/16/17   [name], MD  progesterone (PROMETRIUM) 100 MG capsule Take 1 capsule (100 mg total) by mouth daily. Patient not taking: Reported on 02/01/2017 11/22/16   Aletha Halim, MD    Family History Family History  Problem Relation Age of Onset  . Colon cancer Mother   . Other Neg Hx   . Breast cancer Neg Hx     Social History Social History   Tobacco Use  . Smoking status: Never Smoker  . Smokeless tobacco: Never Used  Substance Use Topics  . Alcohol use: No  . Drug use: No    Comment: 8 years ago for the cocaine  1 year ago with marijuana     Allergies   Patient has no known allergies.   Review of Systems Review of Systems  Constitutional: Negative for chills.  Gastrointestinal: Positive for diarrhea.  All other systems reviewed and are negative.    Physical Exam Updated Vital Signs BP 125/78 (BP Location: Right Arm)   Pulse 76   Temp 98.1 F (36.7 C) (Oral)   Resp 18   Ht 5\' 7"  (1.702 m)   Wt 99.3 kg (219 lb)   LMP 04/01/2010   SpO2 99%   BMI 34.30 kg/m   Physical Exam  Constitutional: She is oriented to person, place, and time. She appears  well-developed and well-nourished. No distress.  HENT:  Head: Normocephalic and atraumatic.  Mucous membranes appear somewhat dry.  Neck: Normal range of motion. Neck supple.  Cardiovascular: Normal rate and regular rhythm. Exam reveals no gallop and no friction rub.  No murmur heard. Pulmonary/Chest: Effort normal and breath sounds normal. No respiratory distress. She has no wheezes. She has no rales.  Abdominal: Soft. Bowel sounds are normal. She exhibits no distension. There is no tenderness.  Musculoskeletal: Normal range of motion.  Lymphadenopathy:    She has no cervical adenopathy.  Neurological: She is alert and oriented to person, place, and time.  Skin: Skin is warm  and dry. She is not diaphoretic.  Nursing note and vitals reviewed.    ED Treatments / Results  Labs (all labs ordered are listed, but only abnormal results are displayed) Labs Reviewed  BASIC METABOLIC PANEL - Abnormal; Notable for the following components:      Result Value   Sodium 134 (*)    Chloride 91 (*)    Glucose, Bld 418 (*)    BUN 22 (*)    Creatinine, Ser 1.57 (*)    GFR calc non Af Amer 36 (*)    GFR calc Af Amer 41 (*)    Anion gap 16 (*)    All other components within normal limits  CBC  I-STAT TROPONIN, ED  I-STAT BETA HCG BLOOD, ED ([facility], [facility], [facility] ONLY)    EKG  EKG Interpretation  Date/Time:  Wednesday September 21 2017 19:29:49 EST Ventricular Rate:  107 PR Interval:  150 QRS Duration: 90 QT Interval:  380 QTC Calculation: 507 R Axis:   25 Text Interpretation:  Sinus tachycardia Otherwise normal ECG Confirmed by Veryl Speak [phone]) on 09/22/2017 4:23:45 AM       Radiology Dg Chest 2 View  Result Date: 09/21/2017 CLINICAL DATA:  Chest pain with vomiting EXAM: CHEST - 2 VIEW COMPARISON:  01/18/2017 FINDINGS: The heart size and mediastinal contours are within normal limits. Both lungs are clear. Mild degenerative changes of the spine. IMPRESSION: No active cardiopulmonary disease.  Electronically Signed   By: Donavan Foil M.D.   On: 09/21/2017 20:03    Procedures Procedures (including critical care time)  Medications Ordered in ED Medications  sodium chloride 0.9 % bolus 1,000 mL (not administered)  ondansetron (ZOFRAN) injection 4 mg (not administered)  ketorolac (TORADOL) 30 MG/ML injection 30 mg (not administered)     Initial Impression / Assessment and Plan / ED Course  I have reviewed the triage vital signs and the nursing notes.  Pertinent labs & imaging results that were available during my care of the patient were reviewed by me and considered in my medical decision making (see chart for details).  Patient is a 57 year old female with history of diabetes presenting with symptoms most consistent with a viral gastroenteritis.  She has had vomiting and diarrhea at home and has been unable to tolerate much by mouth.  She was given IV fluids, Zofran, Toradol, and insulin.  Her sugars improving and she is now feeling better.  At this point, I see no indication for admission and believe she is appropriate for discharge.  She is to return as needed for any problems.  Final Clinical Impressions(s) / ED Diagnoses   Final diagnoses:  None    ED Discharge Orders    None       Veryl Speak, MD 09/22/17 [phone]

## 2017-09-22 NOTE — Progress Notes (Signed)
Subjective:  Patient ID: [name] [name], female    DOB: May 21, 1961  Age: 57 y.o. MRN: [id number]  CC: Diabetes   HPI [name] is a 56 year old female with a history of type 2 diabetes mellitus (A1c 12.3), diabetic neuropathy hypertension, diabetic retinopathy, hyperlipidemia, hepatitis C (completed treatment with harvoni) who presents today for a follow-up visit from the ED where she was seen early this morning for acute gastroenteritis. Labs are stable and she was treated with IV fluids, Zofran, Toradol and subsequently discharged.  She comes over to the clinic straight from the ED and is yet to go home.  Due to nausea and vomiting she has been unable to tolerate her medications and has not taken any in the last 2 days.  Also complains of a decreased appetite. Symptoms have been present for the last 4 days and she initially had diarrhea which has improved. Her neuropathy symptoms have worsened as she has been out of Lyrica for the last 1 week even though 2 months ago I had prescribed her Lyrica with 5 refills. With regards to her diabetes mellitus she is still taking Byetta even though Victoza was prescribed at her last visit which she states she never obtained from the pharmacy.  Past Medical History:  Diagnosis Date  . Anemia   . Chronic hepatitis C without hepatic coma ([place]) 11/09/2016  . Diabetes mellitus   . Fibroids   . 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 Hypertension   . MRSA (methicillin resistant Staphylococcus aureus)   . Trichomonas   . VAGINITIS, BACTERIAL, RECURRENT 08/15/2007   Qualifier: Diagnosis of  By: Radene Ou MD, [place]      Past Surgical History:  Procedure Laterality Date  . CESAREAN SECTION     breech    No Known Allergies   Outpatient Medications Prior to Visit  Medication Sig Dispense Refill  . ACCU-CHEK SOFTCLIX LANCETS lancets Use to test blood sugar 3 times daily as instructed. Dx code: 30.50 (Patient not  taking: Reported on 09/22/2017) 100 each 11  . albuterol (PROVENTIL HFA;VENTOLIN HFA) 108 (90 Base) MCG/ACT inhaler Inhale 2 puffs into the lungs every 6 (six) hours as needed for wheezing or shortness of breath. (Patient not taking: Reported on 09/22/2017) 1 Inhaler 3  . amLODipine (NORVASC) 10 MG tablet Take 1 tablet (10 mg total) by mouth daily. (Patient not taking: Reported on 09/22/2017) 30 tablet 5  . atorvastatin (LIPITOR) 20 MG tablet Take 1 tablet (20 mg total) by mouth daily. (Patient not taking: Reported on 09/22/2017) 30 tablet 5  . Blood Glucose Monitoring Suppl (ACCU-CHEK AVIVA) device Use as instructed daily. (Patient not taking: Reported on 09/22/2017) 1 each 0  . estradiol (ESTRING) 2 MG vaginal ring Place 2 mg vaginally every 3 (three) months. follow package directions (Patient not taking: Reported on 09/22/2017) 1 each 12  . glucose blood (ACCU-CHEK AVIVA PLUS) test strip Use to test blood sugar 3 times daily as instructed. Dx code: E11.319 (Patient not taking: Reported on 09/22/2017) 100 each 11  . Insulin Glargine (LANTUS SOLOSTAR) 100 UNIT/ML Solostar Pen Inject 50 Units into the skin at bedtime. (Patient not taking: Reported on 09/22/2017) 5 pen 5  . Insulin Pen Needle (BD PEN NEEDLE NANO U/F) 32G X 4 MM MISC Use to inject insulin 4 times daily. (Patient not taking: Reported on 09/22/2017) 150 each 11  . lisinopril-hydrochlorothiazide (ZESTORETIC) 20-12.5 MG tablet Take 2 tablets by mouth daily. (Patient not  taking: Reported on 09/22/2017) 60 tablet 5  . nitroGLYCERIN (NITROSTAT) 0.4 MG SL tablet Place 0.4 mg under the tongue every 5 (five) minutes as needed for chest pain.    Marland Kitchen ondansetron (ZOFRAN ODT) 8 MG disintegrating tablet 8mg  ODT q4 hours prn nausea (Patient not taking: Reported on 09/22/2017) 6 tablet 0  . progesterone (PROMETRIUM) 100 MG capsule Take 1 capsule (100 mg total) by mouth daily. (Patient not taking: Reported on 02/01/2017) 30 capsule 3  . diphenoxylate-atropine (LOMOTIL) 2.5-0.025  MG tablet Take 1 tablet by mouth 4 (four) times daily as needed for diarrhea or loose stools. (Patient not taking: Reported on 09/22/2017) 30 tablet 0  . exenatide (BYETTA) 10 MCG/0.04ML SOPN injection Inject 10 mcg into the skin 2 (two) times daily with a meal.    . liraglutide (VICTOZA) 18 MG/3ML SOPN Inject 0.3 mLs (1.8 mg total) into the skin daily with breakfast. Start with 0.41mL for 1 week then 0.2 mL for 1 week then 0.3 mL thereafter. (Patient not taking: Reported on 09/22/2017) 30 mL 3  . pregabalin (LYRICA) 75 MG capsule Take 1 capsule (75 mg total) by mouth 2 (two) times daily. (Patient not taking: Reported on 09/22/2017) 60 capsule 3   Facility-Administered Medications Prior to Visit  Medication Dose Route Frequency Provider Last Rate Last Dose  . 0.9 %  sodium chloride infusion  500 mL Intravenous Continuous Irene Shipper, MD        ROS Review of Systems  Constitutional: Negative for activity change, appetite change and fatigue.  HENT: Negative for congestion, sinus pressure and sore throat.   Eyes: Negative for visual disturbance.  Respiratory: Negative for cough, chest tightness, shortness of breath and wheezing.   Cardiovascular: Negative for chest pain and palpitations.  Gastrointestinal: Positive for diarrhea and nausea. Negative for abdominal distention, abdominal pain and constipation.  Endocrine: Negative for polydipsia.  Genitourinary: Negative for dysuria and frequency.  Musculoskeletal: Negative for arthralgias and back pain.  Skin: Negative for rash.  Neurological: Positive for numbness. Negative for tremors and light-headedness.  Hematological: Does not bruise/bleed easily.  Psychiatric/Behavioral: Negative for agitation and behavioral problems.    Objective:  BP (!) 144/89   Pulse 84   Temp (!) 97.4 F (36.3 C) (Oral)   Ht 5\' 6"  (1.676 m)   Wt 217 lb 6.4 oz (98.6 kg)   LMP 04/01/2010   SpO2 99%   BMI 35.09 kg/m   BP/Weight 09/22/2017 09/22/2017 07/21/2438    Systolic BP 102 725 -  Diastolic BP 89 88 -  Wt. (Lbs) 217.4 - 219  BMI 35.09 34.3 -      Physical Exam  Constitutional: She is oriented to person, place, and time. She appears well-developed and well-nourished.  Ill looking  Cardiovascular: Normal rate, normal heart sounds and intact distal pulses.  No murmur heard. Pulmonary/Chest: Effort normal and breath sounds normal. She has no wheezes. She has no rales. She exhibits no tenderness.  Abdominal: Soft. Bowel sounds are normal. She exhibits no distension and no mass. There is no tenderness.  Musculoskeletal: Normal range of motion.  Neurological: She is alert and oriented to person, place, and time.    Lab Results  Component Value Date   HGBA1C 12.3 08/16/2017    Assessment & Plan:   1. 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 Start with 0.74mL for 1 week then 0.2 mL for 1 week then 0.3 mL thereafter.  Dispense: 30 mL; Refill: 3  2. Other diabetic neurological complication associated with type 2 diabetes mellitus ([place]) Uncontrolled due to running out of Lyrica which I have refilled again - pregabalin (LYRICA) 75 MG capsule; Take 1 capsule (75 mg total) by mouth 2 (two) times daily.  Dispense: 60 capsule; Refill: 3  3. Gastroenteritis Increase fluid intake  - diphenoxylate-atropine (LOMOTIL) 2.5-0.025 MG tablet; Take 1 tablet by mouth 3 (three) times daily with meals.  Dispense: 30 tablet; Refill: 0 - promethazine (PHENERGAN) 25 MG suppository; Place 1 suppository (25 mg total) rectally every 8 (eight) hours as needed  for nausea or vomiting.  Dispense: 12 each; Refill: 0  4. Essential hypertension uncontrolled due to not taking antihypertensives this morning Counseled on blood pressure goal of less than 130/80, low-sodium, DASH diet, medication compliance, 150 minutes of moderate intensity exercise per week. Discussed medication compliance, adverse effects.   Meds ordered this encounter  Medications  . pregabalin (LYRICA) 75 MG capsule    Sig: Take 1 capsule (75 mg total) by mouth 2 (two) times daily.    Dispense:  60 capsule    Refill:  3  . liraglutide (VICTOZA) 18 MG/3ML SOPN    Sig: Inject 0.3 mLs (1.8 mg total) into the skin daily with breakfast. Start with 0.66mL for 1 week then 0.2 mL for 1 week then 0.3 mL thereafter.    Dispense:  30 mL    Refill:  3    Discontinue Byetta  . diphenoxylate-atropine (LOMOTIL) 2.5-0.025 MG tablet    Sig: Take 1 tablet by mouth 3 (three) times daily with meals.    Dispense:  30 tablet    Refill:  0  . promethazine (PHENERGAN) 25 MG suppository    Sig: Place 1 suppository (25 mg total) rectally every 8 (eight) hours as needed for nausea or vomiting.    Dispense:  12 each    Refill:  0    Follow-up: Return in about 3 months (around 12/23/2017) for Follow-up on diabetes mellitus.   [name] MD

## 2017-09-22 NOTE — Discharge Instructions (Signed)
Continue your diabetes medicines as previously prescribed.  Zofran as prescribed as needed for nausea.  There are liquid diet for the next 12 hours, then slowly advance to normal as tolerated.  Return to the emergency department if you develop severe abdominal pain, high fevers, bloody stool, or other new and concerning symptoms.

## 2017-09-22 NOTE — Progress Notes (Signed)
Patient has stopped all medications for 2 days now.

## 2017-09-22 NOTE — ED Notes (Signed)
ED Provider at bedside. 

## 2017-09-22 NOTE — Patient Instructions (Signed)
Food Choices to Help Relieve Diarrhea, Adult  When you have diarrhea, the foods you eat and your eating habits are very important. Choosing the right foods and drinks can help:  · Relieve diarrhea.  · Replace lost fluids and nutrients.  · Prevent dehydration.    What general guidelines should I follow?  Relieving diarrhea  · Choose foods with less than 2 g or .07 oz. of fiber per serving.  · Limit fats to less than 8 tsp (38 g or 1.34 oz.) a day.  · Avoid the following:  ? Foods and beverages sweetened with high-fructose corn syrup, honey, or sugar alcohols such as xylitol, sorbitol, and mannitol.  ? Foods that contain a lot of fat or sugar.  ? Fried, greasy, or spicy foods.  ? High-fiber grains, breads, and cereals.  ? Raw fruits and vegetables.  · Eat foods that are rich in probiotics. These foods include dairy products such as yogurt and fermented milk products. They help increase healthy bacteria in the stomach and intestines (gastrointestinal tract, or GI tract).  · If you have lactose intolerance, avoid dairy products. These may make your diarrhea worse.  · Take medicine to help stop diarrhea (antidiarrheal medicine) only as told by your health care provider.  Replacing nutrients  · Eat small meals or snacks every 3–4 hours.  · Eat bland foods, such as white rice, toast, or baked potato, until your diarrhea starts to get better. Gradually reintroduce nutrient-rich foods as tolerated or as told by your health care provider. This includes:  ? Well-cooked protein foods.  ? Peeled, seeded, and soft-cooked fruits and vegetables.  ? Low-fat dairy products.  · Take vitamin and mineral supplements as told by your health care provider.  Preventing dehydration    · Start by sipping water or a special solution to prevent dehydration (oral rehydration solution, ORS). Urine that is clear or pale yellow means that you are getting enough fluid.  · Try to drink at least 8–10 cups of fluid each day to help replace lost  fluids.  · You may add other liquids in addition to water, such as clear juice or decaffeinated sports drinks, as tolerated or as told by your health care provider.  · Avoid drinks with caffeine, such as coffee, tea, or soft drinks.  · Avoid alcohol.  What foods are recommended?  The items listed may not be a complete list. Talk with your health care provider about what dietary choices are best for you.  Grains  White rice. White, French, or pita breads (fresh or toasted), including plain rolls, buns, or bagels. White pasta. Saltine, soda, or graham crackers. Pretzels. Low-fiber cereal. Cooked cereals made with water (such as cornmeal, farina, or cream cereals). Plain muffins. Matzo. Melba toast. Zwieback.  Vegetables  Potatoes (without the skin). Most well-cooked and canned vegetables without skins or seeds. Tender lettuce.  Fruits  Apple sauce. Fruits canned in juice. Cooked apricots, cherries, grapefruit, peaches, pears, or plums. Fresh bananas and cantaloupe.  Meats and other protein foods  Baked or boiled chicken. Eggs. Tofu. Fish. Seafood. Smooth nut butters. Ground or well-cooked tender beef, ham, veal, lamb, pork, or poultry.  Dairy  Plain yogurt, kefir, and unsweetened liquid yogurt. Lactose-free milk, buttermilk, skim milk, or soy milk. Low-fat or nonfat hard cheese.  Beverages  Water. Low-calorie sports drinks. Fruit juices without pulp. Strained tomato and vegetable juices. Decaffeinated teas. Sugar-free beverages not sweetened with sugar alcohols. Oral rehydration solutions, if approved by your health care   provider.  Seasoning and other foods  Bouillon, broth, or soups made from recommended foods.  What foods are not recommended?  The items listed may not be a complete list. Talk with your health care provider about what dietary choices are best for you.  Grains  Whole grain, whole wheat, bran, or rye breads, rolls, pastas, and crackers. Wild or brown rice. Whole grain or bran cereals. Barley. Oats  and oatmeal. Corn tortillas or taco shells. Granola. Popcorn.  Vegetables  Raw vegetables. Fried vegetables. Cabbage, broccoli, Brussels sprouts, artichokes, baked beans, beet greens, corn, kale, legumes, peas, sweet potatoes, and yams. Potato skins. Cooked spinach and cabbage.  Fruits  Dried fruit, including raisins and dates. Raw fruits. Stewed or dried prunes. Canned fruits with syrup.  Meat and other protein foods  Fried or fatty meats. Deli meats. Chunky nut butters. Nuts and seeds. Beans and lentils. Bacon. Hot dogs. Sausage.  Dairy  High-fat cheeses. Whole milk, chocolate milk, and beverages made with milk, such as milk shakes. Half-and-half. Cream. sour cream. Ice cream.  Beverages  Caffeinated beverages (such as coffee, tea, soda, or energy drinks). Alcoholic beverages. Fruit juices with pulp. Prune juice. Soft drinks sweetened with high-fructose corn syrup or sugar alcohols. High-calorie sports drinks.  Fats and oils  Butter. Cream sauces. Margarine. Salad oils. Plain salad dressings. Olives. Avocados. Mayonnaise.  Sweets and desserts  Sweet rolls, doughnuts, and sweet breads. Sugar-free desserts sweetened with sugar alcohols such as xylitol and sorbitol.  Seasoning and other foods  Honey. Hot sauce. Chili powder. Gravy. Cream-based or milk-based soups. Pancakes and waffles.  Summary  · When you have diarrhea, the foods you eat and your eating habits are very important.  · Make sure you get at least 8–10 cups of fluid each day, or enough to keep your urine clear or pale yellow.  · Eat bland foods and gradually reintroduce healthy, nutrient-rich foods as tolerated, or as told by your health care provider.  · Avoid high-fiber, fried, greasy, or spicy foods.  This information is not intended to replace advice given to you by your health care provider. Make sure you discuss any questions you have with your health care provider.  Document Released: 09/25/2003 Document Revised: 07/02/2016 Document  Reviewed: 07/02/2016  [employer]vier Interactive Patient Education © 2018 [employer]

## 2017-10-26 ENCOUNTER — Other Ambulatory Visit: Payer: Self-pay

## 2017-10-26 ENCOUNTER — Telehealth: Payer: Self-pay | Admitting: Family Medicine

## 2017-10-26 DIAGNOSIS — K529 Noninfective gastroenteritis and colitis, unspecified: Secondary | ICD-10-CM

## 2017-10-26 MED ORDER — DIPHENOXYLATE-ATROPINE 2.5-0.025 MG PO TABS: 1.0000 | ORAL_TABLET | Freq: Three times a day (TID) | ORAL | 0 refills | Status: DC

## 2017-10-26 NOTE — Telephone Encounter (Signed)
Done

## 2017-10-26 NOTE — Telephone Encounter (Signed)
Pt called to request a refill on her Diarrhea medication Please follow up

## 2017-10-26 NOTE — Telephone Encounter (Signed)
Patient will like a refill for Lomotil

## 2017-10-28 NOTE — Telephone Encounter (Signed)
Patient called and informed of medication refill.

## 2017-11-07 ENCOUNTER — Other Ambulatory Visit: Payer: Self-pay

## 2017-11-07 ENCOUNTER — Ambulatory Visit (HOSPITAL_COMMUNITY)
Admission: EM | Admit: 2017-11-07 | Discharge: 2017-11-07 | Disposition: A | Discharge status: Home / Self Care | Payer: Medicaid Other | Attending: Family Medicine | Admitting: Family Medicine

## 2017-11-07 ENCOUNTER — Encounter (HOSPITAL_COMMUNITY): Payer: Self-pay

## 2017-11-07 DIAGNOSIS — E1140 Type 2 diabetes mellitus with diabetic neuropathy, unspecified: Secondary | ICD-10-CM | POA: Insufficient documentation

## 2017-11-07 DIAGNOSIS — E785 Hyperlipidemia, unspecified: Secondary | ICD-10-CM | POA: Insufficient documentation

## 2017-11-07 DIAGNOSIS — I10 Essential (primary) hypertension: Secondary | ICD-10-CM | POA: Diagnosis not present

## 2017-11-07 DIAGNOSIS — B182 Chronic viral hepatitis C: Secondary | ICD-10-CM | POA: Diagnosis not present

## 2017-11-07 DIAGNOSIS — Z800 Family history of malignant neoplasm of digestive organs: Secondary | ICD-10-CM | POA: Insufficient documentation

## 2017-11-07 DIAGNOSIS — L298 Other pruritus: Secondary | ICD-10-CM | POA: Diagnosis present

## 2017-11-07 DIAGNOSIS — N898 Other specified noninflammatory disorders of vagina: Secondary | ICD-10-CM

## 2017-11-07 DIAGNOSIS — Z79899 Other long term (current) drug therapy: Secondary | ICD-10-CM | POA: Diagnosis not present

## 2017-11-07 MED ORDER — VALACYCLOVIR HCL 500 MG PO TABS: 500.0000 mg | ORAL_TABLET | Freq: Two times a day (BID) | ORAL | 0 refills | Status: AC

## 2017-11-07 NOTE — ED Provider Notes (Signed)
[place]    CSN: [id number] Arrival date & time: 11/07/17  1001     History   Chief Complaint Chief Complaint  Patient presents with  . Vaginal Itching    HPI [name] [name] is a 57 y.o. female.   57 year old female comes in  with 2-day history of painful sores on the vaginal area.  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  She denies vaginal discharge, spotting.  Denies urinary symptoms such as frequency, dysuria, hematuria.  Denies abdominal pain, nausea, vomiting.  Denies fever, chills, night sweats.  Has been postmenopausal for the past few years.  States she was evaluated for atrophic vaginitis, and has been putting ointment to the vaginal area, and has been attempting to use Estring.  States she has been able to insert Estring, but it would fall out on its own.  Last sexual activity 1 year ago due to dyspareunia.      Past Medical History:  Diagnosis Date  . Anemia   . Chronic hepatitis C without hepatic coma ([place]) 11/09/2016  . Diabetes mellitus   . Fibroids   . 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 Hypertension   . MRSA (methicillin resistant Staphylococcus aureus)   . Trichomonas   . VAGINITIS, BACTERIAL, RECURRENT 08/15/2007   Qualifier: Diagnosis of  By: Radene Ou MD, [place]      Patient Active Problem List   Diagnosis Date Noted  . Diabetic neuropathy ([place]) 06/16/2017  . Non compliance w medication regimen 04/12/2017  . Diarrhea 11/17/2016  . Family history of colon cancer in mother 11/17/2016  . Dyspareunia in female 11/11/2016  . Screen for colon cancer 11/11/2016  . History of ovarian cyst 11/11/2016  . Chronic hepatitis C without hepatic coma ([place]) 11/09/2016  . Hyperlipidemia 10/07/2016  . Type 2 diabetes mellitus ([place]) 01/29/2014  . BREAST PAIN, BILATERAL 02/19/2010  . ANEMIA, IRON DEFICIENCY 10/03/2009  . LEUKOPENIA, MILD 09/19/2009  . CHEST PAIN UNSPECIFIED 09/19/2009  . LIPOMA  06/18/2009  . 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 TINEA PEDIS 05/07/2009  . SKIN LESION 05/07/2009  . COUGH 05/07/2009  . URI 04/04/2007  . SUBSTANCE ABUSE, MULTIPLE 02/22/2007  . Essential hypertension 02/22/2007    Past Surgical History:  Procedure Laterality Date  . CESAREAN SECTION     breech    OB History    Gravida  1   Para  1   Term  1   Preterm      AB      Living  1     SAB      TAB      Ectopic      Multiple      Live Births  1            Home Medications    Prior to Admission medications   Medication Sig Start Date End Date Taking? Authorizing Provider  ACCU-CHEK SOFTCLIX LANCETS lancets Use to test blood sugar 3 times daily as instructed. Dx code: 250.50 10/07/16  Yes [name], MD  diphenoxylate-atropine (LOMOTIL) 2.5-0.025 MG tablet Take 1 tablet by mouth 3 (three) times daily with meals. 10/26/17  Yes Newlin, Charlane Ferretti, MD  liraglutide (VICTOZA) 18 MG/3ML SOPN Inject 0.3 mLs (1.8 mg total) into the skin daily with breakfast. Start with 0.33mL for 1 week then 0.2 mL for 1 week then 0.3 mL thereafter. 09/22/17  Yes [name], MD  pregabalin (LYRICA) 75 MG capsule Take 1 capsule (75 mg total) by mouth 2 (two) times daily. 09/22/17  Yes [name], MD  promethazine (PHENERGAN) 25 MG suppository Place 1 suppository (25 mg total) rectally every 8 (eight) hours as needed for nausea or vomiting. 09/22/17  Yes [name], MD  albuterol (PROVENTIL HFA;VENTOLIN HFA) 108 (90 Base) MCG/ACT inhaler Inhale 2 puffs into the lungs every 6 (six) hours as needed for wheezing or shortness of breath. Patient not taking: Reported on 09/22/2017 04/12/17   [name], MD  amLODipine (NORVASC) 10 MG tablet Take 1 tablet (10 mg total) by mouth daily. Patient not taking: Reported on 09/22/2017 08/16/17   [name], MD  atorvastatin (LIPITOR) 20 MG tablet Take 1 tablet (20 mg total) by mouth daily. Patient not taking: Reported on 09/22/2017 08/16/17    [name], MD  Blood Glucose Monitoring Suppl (ACCU-CHEK AVIVA) device Use as instructed daily. Patient not taking: Reported on 09/22/2017 10/07/16   [name], MD  estradiol (ESTRING) 2 MG vaginal ring Place 2 mg vaginally every 3 (three) months. follow package directions Patient not taking: Reported on 09/22/2017 03/18/17   Aletha Halim, MD  glucose blood (ACCU-CHEK AVIVA PLUS) test strip Use to test blood sugar 3 times daily as instructed. Dx code: E11.319 Patient not taking: Reported on 09/22/2017 10/07/16   [name], MD  Insulin Glargine (LANTUS SOLOSTAR) 100 UNIT/ML Solostar Pen Inject 50 Units into the skin at bedtime. Patient not taking: Reported on 09/22/2017 08/16/17   [name], MD  Insulin Pen Needle (BD PEN NEEDLE NANO U/F) 32G X 4 MM MISC Use to inject insulin 4 times daily. Patient not taking: Reported on 09/22/2017 11/08/16   [name], MD  lisinopril-hydrochlorothiazide (ZESTORETIC) 20-12.5 MG tablet Take 2 tablets by mouth daily. Patient not taking: Reported on 09/22/2017 08/16/17   [name], MD  nitroGLYCERIN (NITROSTAT) 0.4 MG SL tablet Place 0.4 mg under the tongue every 5 (five) minutes as needed for chest pain.    [provider]  ondansetron (ZOFRAN ODT) 8 MG disintegrating tablet 8mg  ODT q4 hours prn nausea Patient not taking: Reported on 09/22/2017 09/22/17   Veryl Speak, MD  progesterone (PROMETRIUM) 100 MG capsule Take 1 capsule (100 mg total) by mouth daily. Patient not taking: Reported on 02/01/2017 11/22/16   Aletha Halim, MD  valACYclovir (VALTREX) 500 MG tablet Take 1 tablet (500 mg total) by mouth 2 (two) times daily for 3 days. 11/07/17 11/10/17  Ok Edwards, PA-C    Family History Family History  Problem Relation Age of Onset  . Colon cancer Mother   . Other Neg Hx   . Breast cancer Neg Hx     Social History Social History   Tobacco Use  . Smoking status: Never Smoker  . Smokeless tobacco: Never Used  Substance Use  Topics  . Alcohol use: No  . Drug use: No    Types: Cocaine, Marijuana    Comment: 8 years ago for the cocaine  1 year ago with marijuana     Allergies   Patient has no known allergies.   Review of Systems Review of Systems  Reason unable to perform ROS: See HPI as above.     Physical Exam Triage Vital Signs ED Triage Vitals  Enc Vitals Group     BP 11/07/17 1016 (!) 156/89     Pulse Rate 11/07/17 1016 97     Resp 11/07/17 1016 18     Temp 11/07/17 1016 (!)  97.3 F (36.3 C)     Temp src --      SpO2 11/07/17 1016 98 %     Weight 11/07/17 1013 217 lb (98.4 kg)     Height --      Head Circumference --      Peak Flow --      Pain Score 11/07/17 1013 0     Pain Loc --      Pain Edu? --      Excl. in [place]? --    No data found.  Updated Vital Signs BP (!) 156/89   Pulse 97   Temp (!) 97.3 F (36.3 C)   Resp 18   Wt 217 lb (98.4 kg)   LMP 04/01/2010   SpO2 98%   BMI 35.02 kg/m   Physical Exam  Constitutional: She is oriented to person, place, and time. She appears well-developed and well-nourished. No distress.  HENT:  Head: Normocephalic and atraumatic.  Eyes: Pupils are equal, round, and reactive to light. Conjunctivae are normal.  Genitourinary:     Genitourinary Comments: Vaginal ulcers at 12:00 region.   Some discharge noted at the vaginal opening.   Neurological: She is alert and oriented to person, place, and time.     UC Treatments / Results  Labs (all labs ordered are listed, but only abnormal results are displayed) Labs Reviewed  HSV CULTURE AND TYPING  CERVICOVAGINAL ANCILLARY ONLY    EKG None Radiology No results found.  Procedures Procedures (including critical care time)  Medications Ordered in UC Medications - No data to display   Initial Impression / Assessment and Plan / UC Course  I have reviewed the triage vital signs and the nursing notes.  Pertinent labs & imaging results that were available during my care of the  patient were reviewed by me and considered in my medical decision making (see chart for details).    Will treat for HSV with valtrex. Discharge noted at vaginal opening, which patient states is due to vaginal cream she applied. Cytology and hsv swab obtained. Patient requested help inserting estring. Estring inserted without difficulty, patient tolerated without any complications. Patient will follow up with GYN for further evaluation and management of atrophic vaginitis. Return precautions given. Patient expresses understanding and agrees to plan.   Final Clinical Impressions(s) / UC Diagnoses   Final diagnoses:  Vaginal sore    ED Discharge Orders        Ordered    valACYclovir (VALTREX) 500 MG tablet  2 times daily     11/07/17 1037        Ok Edwards, [place] 11/07/17 1056

## 2017-11-07 NOTE — Discharge Instructions (Addendum)
Given the appearance of the sores, treating you for herpes with valacyclovir as directed. Cytology sent, you will be contacted with any positive results that requires further treatment. Refrain from sexual activity and alcohol use for the next 7 days. Monitor for any worsening of symptoms, fever, abdominal pain, nausea, vomiting, to follow up for reevaluation.  Follow up with your GYN for continued monitoring and evaluation of Estring use and vaginal soreness/irritation.

## 2017-11-07 NOTE — ED Triage Notes (Signed)
Pt presents with complaints of having a sore on the top of her vagina since Saturday.

## 2017-11-08 LAB — CERVICOVAGINAL ANCILLARY ONLY
Bacterial vaginitis: NEGATIVE
CHLAMYDIA, DNA PROBE: NEGATIVE
Candida vaginitis: POSITIVE — AB
Neisseria Gonorrhea: NEGATIVE
TRICH (WINDOWPATH): NEGATIVE

## 2017-11-11 LAB — HSV CULTURE AND TYPING

## 2017-11-12 ENCOUNTER — Telehealth (HOSPITAL_COMMUNITY): Payer: Self-pay

## 2017-11-12 MED ORDER — FLUCONAZOLE 150 MG PO TABS: 150.0000 mg | ORAL_TABLET | Freq: Every day | ORAL | 0 refills | Status: AC

## 2017-11-12 NOTE — Progress Notes (Signed)
Herpes screening is positive for HSV , attempted to reach patient. No answer at this time. Voicemail left.

## 2017-11-12 NOTE — Telephone Encounter (Signed)
Pt contacted regarding test for candida (yeast) was positive.  Prescription for fluconazole 150mg  po now, repeat dose in 3d if needed, #2 no refills, sent to the pharmacy of record.  Recheck or followup with PCP for further evaluation if symptoms are not improving.  Answered all questions.

## 2017-12-26 ENCOUNTER — Ambulatory Visit: Payer: Medicaid Other | Admitting: Family Medicine

## 2018-01-05 ENCOUNTER — Ambulatory Visit: Payer: Medicaid Other | Attending: Family Medicine | Admitting: Physician Assistant

## 2018-01-05 VITALS — BP 131/82 | HR 95 | Temp 98.2°F | Resp 18 | Ht 66.0 in | Wt 234.0 lb

## 2018-01-05 DIAGNOSIS — I10 Essential (primary) hypertension: Secondary | ICD-10-CM | POA: Diagnosis not present

## 2018-01-05 DIAGNOSIS — Z794 Long term (current) use of insulin: Secondary | ICD-10-CM | POA: Insufficient documentation

## 2018-01-05 DIAGNOSIS — Z9114 Patient's other noncompliance with medication regimen: Secondary | ICD-10-CM | POA: Diagnosis not present

## 2018-01-05 DIAGNOSIS — B182 Chronic viral hepatitis C: Secondary | ICD-10-CM | POA: Diagnosis not present

## 2018-01-05 DIAGNOSIS — R05 Cough: Secondary | ICD-10-CM | POA: Diagnosis present

## 2018-01-05 DIAGNOSIS — Z79899 Other long term (current) drug therapy: Secondary | ICD-10-CM | POA: Diagnosis not present

## 2018-01-05 DIAGNOSIS — E1149 Type 2 diabetes mellitus with other diabetic neurological complication: Secondary | ICD-10-CM | POA: Diagnosis not present

## 2018-01-05 DIAGNOSIS — E7800 Pure hypercholesterolemia, unspecified: Secondary | ICD-10-CM | POA: Diagnosis not present

## 2018-01-05 DIAGNOSIS — E11311 Type 2 diabetes mellitus with unspecified diabetic retinopathy with macular edema: Secondary | ICD-10-CM | POA: Diagnosis not present

## 2018-01-05 DIAGNOSIS — J069 Acute upper respiratory infection, unspecified: Secondary | ICD-10-CM | POA: Diagnosis not present

## 2018-01-05 LAB — POCT GLYCOSYLATED HEMOGLOBIN (HGB A1C): HbA1c, POC (controlled diabetic range): 9.8 % — AB (ref 0.0–7.0)

## 2018-01-05 LAB — GLUCOSE, POCT (MANUAL RESULT ENTRY): POC Glucose: 226 mg/dl — AB (ref 70–99)

## 2018-01-05 MED ORDER — PREGABALIN 75 MG PO CAPS: 75.0000 mg | ORAL_CAPSULE | Freq: Two times a day (BID) | ORAL | 3 refills | Status: DC

## 2018-01-05 MED ORDER — ATORVASTATIN CALCIUM 20 MG PO TABS: 20.0000 mg | ORAL_TABLET | Freq: Every day | ORAL | 5 refills | Status: DC

## 2018-01-05 MED ORDER — INSULIN PEN NEEDLE 32G X 4 MM MISC: 11 refills | Status: DC

## 2018-01-05 MED ORDER — INSULIN GLARGINE 100 UNIT/ML SOLOSTAR PEN: 50.0000 [IU] | PEN_INJECTOR | Freq: Every day | SUBCUTANEOUS | 5 refills | Status: DC

## 2018-01-05 MED ORDER — LIRAGLUTIDE 18 MG/3ML ~~LOC~~ SOPN: 1.8000 mg | PEN_INJECTOR | Freq: Every day | SUBCUTANEOUS | 3 refills | Status: DC

## 2018-01-05 MED ORDER — AZITHROMYCIN 250 MG PO TABS: ORAL_TABLET | ORAL | 0 refills | Status: DC

## 2018-01-05 MED ORDER — AMLODIPINE BESYLATE 10 MG PO TABS: 10.0000 mg | ORAL_TABLET | Freq: Every day | ORAL | 5 refills | Status: DC

## 2018-01-05 MED ORDER — LISINOPRIL-HYDROCHLOROTHIAZIDE 20-12.5 MG PO TABS: 2.0000 | ORAL_TABLET | Freq: Every day | ORAL | 5 refills | Status: DC

## 2018-01-05 NOTE — Progress Notes (Signed)
Patient ID: [name] [name], female   DOB: 19-Jul-1961, 57 y.o.   MRN: [id number]   [name] [name], is a 57 y.o. female  WIO:[id number]  EQA:[id number]  DOB - 06-Mar-1961  Subjective:  Chief Complaint and HPI: [name] [name] is a 57 y.o. female here for RF and med check.  Takes diabetes meds about 75% of the time.    Cough for about 10 days.  Coughing up yellow mucus.  No f/c.    Takes Bp meds daily.  No HA/Dizziness/CP/SOB.   ROS:   Constitutional:  No f/c, No night sweats, No unexplained weight loss. EENT:  No vision changes, No blurry vision, No hearing changes. No mouth, throat, or ear problems.  Respiratory: + cough, No SOB Cardiac: No CP, no palpitations GI:  No abd pain, No N/V/D. GU: No Urinary s/sx Musculoskeletal: No joint pain Neuro: No headache, no dizziness, no motor weakness.  Skin: No rash Endocrine:  No polydipsia. No polyuria.  Psych: Denies SI/HI  No problems updated.  ALLERGIES: No Known Allergies  PAST MEDICAL HISTORY: Past Medical History:  Diagnosis Date  . Anemia   . Chronic hepatitis C without hepatic coma ([place]) 11/09/2016  . Diabetes mellitus   . Fibroids   . 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 Hypertension   . MRSA (methicillin resistant Staphylococcus aureus)   . Trichomonas   . VAGINITIS, BACTERIAL, RECURRENT 08/15/2007   Qualifier: Diagnosis of  By: Radene Ou MD, [place]: Prior to Admission medications   Medication Sig Start Date End Date Taking? Authorizing Provider  ACCU-CHEK SOFTCLIX LANCETS lancets Use to test blood sugar 3 times daily as instructed. Dx code: 250.50 10/07/16  Yes [name], MD  albuterol (PROVENTIL HFA;VENTOLIN HFA) 108 (90 Base) MCG/ACT inhaler Inhale 2 puffs into the lungs every 6 (six) hours as needed for wheezing or shortness of breath. 04/12/17  Yes [name], MD  amLODipine (NORVASC) 10 MG tablet Take 1 tablet (10 mg total) by mouth daily. 01/05/18   Yes Freeman Caldron M, PA-C  atorvastatin (LIPITOR) 20 MG tablet Take 1 tablet (20 mg total) by mouth daily. 01/05/18  Yes Rushil Kimbrell M, PA-C  Blood Glucose Monitoring Suppl (ACCU-CHEK AVIVA) device Use as instructed daily. 10/07/16  Yes [name], MD  diphenoxylate-atropine (LOMOTIL) 2.5-0.025 MG tablet Take 1 tablet by mouth 3 (three) times daily with meals. 10/26/17  Yes Newlin, Charlane Ferretti, MD  glucose blood (ACCU-CHEK AVIVA PLUS) test strip Use to test blood sugar 3 times daily as instructed. Dx code: E11.319 10/07/16  Yes [name], MD  Insulin Glargine (LANTUS SOLOSTAR) 100 UNIT/ML Solostar Pen Inject 50 Units into the skin at bedtime. 01/05/18  Yes Ashlen Kiger M, PA-C  Insulin Pen Needle (BD PEN NEEDLE NANO U/F) 32G X 4 MM MISC Use to inject insulin 4 times daily. 01/05/18  Yes Oluwasemilore Bahl M, PA-C  liraglutide (VICTOZA) 18 MG/3ML SOPN Inject 0.3 mLs (1.8 mg total) into the skin daily with breakfast. Start with 0.45mL for 1 week then 0.2 mL for 1 week then 0.3 mL thereafter. 01/05/18  Yes Siler Mavis M, PA-C  lisinopril-hydrochlorothiazide (ZESTORETIC) 20-12.5 MG tablet Take 2 tablets by mouth daily. 01/05/18  Yes Argentina Donovan, PA-C  nitroGLYCERIN (NITROSTAT) 0.4 MG SL tablet Place 0.4 mg under the tongue every 5 (five) minutes as needed for chest pain.   Yes [provider]  ondansetron (ZOFRAN ODT) 8 MG disintegrating tablet  8mg  ODT q4 hours prn nausea 09/22/17  Yes Delo, Nathaneil Canary, MD  pregabalin (LYRICA) 75 MG capsule Take 1 capsule (75 mg total) by mouth 2 (two) times daily. 01/05/18  Yes Argentina Donovan, PA-C  azithromycin (ZITHROMAX) 250 MG tablet Take 2 today then 1 daily 01/05/18   Argentina Donovan, PA-C  estradiol (ESTRING) 2 MG vaginal ring Place 2 mg vaginally every 3 (three) months. follow package directions Patient not taking: Reported on 09/22/2017 03/18/17   Aletha Halim, MD  progesterone (PROMETRIUM) 100 MG capsule Take 1 capsule (100 mg total) by  mouth daily. Patient not taking: Reported on 02/01/2017 11/22/16   Aletha Halim, MD  promethazine (PHENERGAN) 25 MG suppository Place 1 suppository (25 mg total) rectally every 8 (eight) hours as needed for nausea or vomiting. Patient not taking: Reported on 01/05/2018 09/22/17   [name], MD     Objective:  EXAM:   Vitals:   01/05/18 1121  BP: 131/82  Pulse: 95  Resp: 18  Temp: 98.2 F (36.8 C)  TempSrc: Oral  SpO2: 95%  Weight: 234 lb (106.1 kg)  Height: 5\' 6"  (1.676 m)    General appearance : A&OX3. NAD. Non-toxic-appearing HEENT: Atraumatic and Normocephalic.  PERRLA. EOM intact.  TM clear B. Mouth-MMM, post pharynx WNL w/o erythema, No PND. Neck: supple, no JVD. No cervical lymphadenopathy. No thyromegaly Chest/Lungs:  Breathing-non-labored, Good air entry bilaterally, breath sounds normal without rales, rhonchi, or wheezing  CVS: S1 S2 regular, no murmurs, gallops, rubs  Extremities: Bilateral Lower Ext shows no edema, both legs are warm to touch with = pulse throughout Neurology:  CN II-XII grossly intact, Non focal.   Psych:  TP linear. J/I WNL. Normal speech. Appropriate eye contact and affect.  Skin:  No Rash  Data Review Lab Results  Component Value Date   HGBA1C 9.8 (A) 01/05/2018   HGBA1C 12.3 08/16/2017   HGBA1C 14.7 04/12/2017     Assessment & Plan   1. 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 - Glucose (CBG) - HgB A1c - Insulin Glargine (LANTUS SOLOSTAR) 100 UNIT/ML Solostar Pen; Inject 50 Units into the skin at bedtime.  Dispense: 5 pen; Refill: 5 - Insulin Pen Needle (BD PEN NEEDLE NANO U/F) 32G X 4 MM MISC; Use to inject insulin 4 times daily.  Dispense: 150 each; Refill: 11 - liraglutide (VICTOZA) 18 MG/3ML SOPN; Inject 0.3 mLs (1.8  mg total) into the skin daily with breakfast. Start with 0.64mL for 1 week then 0.2 mL for 1 week then 0.3 mL thereafter.  Dispense: 30 mL; Refill: 3   2. Pure hypercholesterolemia Continue current regimen - atorvastatin (LIPITOR) 20 MG tablet; Take 1 tablet (20 mg total) by mouth daily.  Dispense: 30 tablet; Refill: 5  4. Essential hypertension Controlled-continue current regimen - amLODipine (NORVASC) 10 MG tablet; Take 1 tablet (10 mg total) by mouth daily.  Dispense: 30 tablet; Refill: 5 - lisinopril-hydrochlorothiazide (ZESTORETIC) 20-12.5 MG tablet; Take 2 tablets by mouth daily.  Dispense: 60 tablet; Refill: 5  5. Other diabetic neurological complication associated with type 2 diabetes mellitus ([facility]) - pregabalin (LYRICA) 75 MG capsule; Take 1 capsule (75 mg total) by mouth 2 (two) times daily.  Dispense: 60 capsule; Refill: 3  6. Non compliance w medication regimen Adherence and compliance discussed at length  7. Upper respiratory tract  infection, unspecified type Cover for atypicals bc going on longer than 1 week - azithromycin (ZITHROMAX) 250 MG tablet; Take 2 today then 1 daily  Dispense: 6 tablet; Refill: 0   Patient have been counseled extensively about nutrition and exercise  Return in about 3 months (around 04/07/2018) for Dr Margarita Rana; htn and DM.  The patient was given clear instructions to go to ER or return to medical center if symptoms don't improve, worsen or new problems develop. The patient verbalized understanding. The patient was told to call to get lab results if they haven't heard anything in the next week.     Freeman Caldron, PA-C [facility] [place], [place]   01/05/2018, 11:37 AM

## 2018-01-05 NOTE — Patient Instructions (Signed)
Take all meds as directed.  Work on diet, eliminating sugars and carbohydrates

## 2018-01-09 ENCOUNTER — Emergency Department (HOSPITAL_COMMUNITY): Payer: Medicaid Other

## 2018-01-09 ENCOUNTER — Other Ambulatory Visit: Payer: Self-pay

## 2018-01-09 ENCOUNTER — Emergency Department (HOSPITAL_COMMUNITY)
Admission: EM | Admit: 2018-01-09 | Discharge: 2018-01-09 | Disposition: A | Discharge status: Home / Self Care | Payer: Medicaid Other | Attending: Emergency Medicine | Admitting: Emergency Medicine

## 2018-01-09 DIAGNOSIS — Y998 Other external cause status: Secondary | ICD-10-CM | POA: Insufficient documentation

## 2018-01-09 DIAGNOSIS — E1140 Type 2 diabetes mellitus with diabetic neuropathy, unspecified: Secondary | ICD-10-CM | POA: Diagnosis not present

## 2018-01-09 DIAGNOSIS — S99911A Unspecified injury of right ankle, initial encounter: Secondary | ICD-10-CM | POA: Diagnosis present

## 2018-01-09 DIAGNOSIS — I10 Essential (primary) hypertension: Secondary | ICD-10-CM | POA: Insufficient documentation

## 2018-01-09 DIAGNOSIS — Y9301 Activity, walking, marching and hiking: Secondary | ICD-10-CM | POA: Diagnosis not present

## 2018-01-09 DIAGNOSIS — S93401A Sprain of unspecified ligament of right ankle, initial encounter: Secondary | ICD-10-CM | POA: Diagnosis not present

## 2018-01-09 DIAGNOSIS — R0789 Other chest pain: Secondary | ICD-10-CM

## 2018-01-09 DIAGNOSIS — Y92512 Supermarket, store or market as the place of occurrence of the external cause: Secondary | ICD-10-CM | POA: Insufficient documentation

## 2018-01-09 DIAGNOSIS — W010XXA Fall on same level from slipping, tripping and stumbling without subsequent striking against object, initial encounter: Secondary | ICD-10-CM | POA: Insufficient documentation

## 2018-01-09 DIAGNOSIS — Z794 Long term (current) use of insulin: Secondary | ICD-10-CM | POA: Insufficient documentation

## 2018-01-09 DIAGNOSIS — Z79899 Other long term (current) drug therapy: Secondary | ICD-10-CM | POA: Diagnosis not present

## 2018-01-09 DIAGNOSIS — R197 Diarrhea, unspecified: Secondary | ICD-10-CM | POA: Diagnosis not present

## 2018-01-09 DIAGNOSIS — D649 Anemia, unspecified: Secondary | ICD-10-CM

## 2018-01-09 LAB — CBG MONITORING, ED: GLUCOSE-CAPILLARY: 158 mg/dL — AB (ref 65–99)

## 2018-01-09 LAB — CBC
HCT: 30.7 % — ABNORMAL LOW (ref 36.0–46.0)
HEMOGLOBIN: 9.4 g/dL — AB (ref 12.0–15.0)
MCH: 26.2 pg (ref 26.0–34.0)
MCHC: 30.6 g/dL (ref 30.0–36.0)
MCV: 85.5 fL (ref 78.0–100.0)
Platelets: 219 10*3/uL (ref 150–400)
RBC: 3.59 MIL/uL — ABNORMAL LOW (ref 3.87–5.11)
RDW: 13.9 % (ref 11.5–15.5)
WBC: 3.9 10*3/uL — ABNORMAL LOW (ref 4.0–10.5)

## 2018-01-09 LAB — BASIC METABOLIC PANEL
ANION GAP: 12 (ref 5–15)
BUN: 28 mg/dL — ABNORMAL HIGH (ref 6–20)
CHLORIDE: 106 mmol/L (ref 101–111)
CO2: 22 mmol/L (ref 22–32)
Calcium: 8.8 mg/dL — ABNORMAL LOW (ref 8.9–10.3)
Creatinine, Ser: 1.08 mg/dL — ABNORMAL HIGH (ref 0.44–1.00)
GFR calc Af Amer: >60 mL/min (ref >60)
GFR calc non Af Amer: 56 mL/min — ABNORMAL LOW (ref >60)
Glucose, Bld: 110 mg/dL — ABNORMAL HIGH (ref 65–99)
Potassium: 3.7 mmol/L (ref 3.5–5.1)
Sodium: 140 mmol/L (ref 135–145)

## 2018-01-09 LAB — I-STAT TROPONIN, ED
TROPONIN I, POC: 0 ng/mL (ref 0.00–0.08)
Troponin i, poc: 0.01 ng/mL (ref 0.00–0.08)

## 2018-01-09 LAB — POC OCCULT BLOOD, ED: FECAL OCCULT BLD: NEGATIVE

## 2018-01-09 LAB — I-STAT BETA HCG BLOOD, ED (MC, WL, AP ONLY): I-stat hCG, quantitative: <5 m[IU]/mL (ref <5)

## 2018-01-09 MED ORDER — LORAZEPAM 2 MG/ML IJ SOLN
1.0000 mg | Freq: Once | INTRAMUSCULAR | Status: AC
Start: 2018-01-09 — End: 2018-01-09
  Administered 2018-01-09: 1 mg via INTRAVENOUS
  Filled 2018-01-09: qty 1

## 2018-01-09 MED ORDER — FAMOTIDINE 20 MG PO TABS: 20.0000 mg | ORAL_TABLET | Freq: Two times a day (BID) | ORAL | 0 refills | Status: DC

## 2018-01-09 MED ORDER — HYDROCODONE-ACETAMINOPHEN 5-325 MG PO TABS
1.0000 | ORAL_TABLET | Freq: Once | ORAL | Status: AC
  Administered 2018-01-09: 1 via ORAL
  Filled 2018-01-09: qty 1

## 2018-01-09 MED ORDER — HYDROCODONE-ACETAMINOPHEN 5-325 MG PO TABS: 1.0000 | ORAL_TABLET | ORAL | 0 refills | Status: DC | PRN

## 2018-01-09 NOTE — Progress Notes (Signed)
Orthopedic Tech Progress Note Patient Details:  [name] [name] 05-Nov-1960 [id number]  Ortho Devices Type of Ortho Device: Ankle Air splint, Crutches Ortho Device/Splint Interventions: Application   Post Interventions Patient Tolerated: Well Instructions Provided: Care of device   [place] Pink 01/09/2018, 6:25 PM

## 2018-01-09 NOTE — ED Notes (Signed)
Ortho tech at bedside 

## 2018-01-09 NOTE — ED Notes (Signed)
Patient verbalizes understanding of discharge instructions. Opportunity for questioning and answers were provided. Armband removed by staff, pt discharged from ED in wheelchair.  

## 2018-01-09 NOTE — ED Notes (Signed)
Pt currently having pain in her ribs on right side. MD called to room to examine pt.

## 2018-01-09 NOTE — ED Triage Notes (Signed)
[place] Pt reports a mechanical fall while shopping today. Pt has swollen right ankle from fall. Pt reports also has had chest pain off and on since Sat. Described as tightness, lasting less than a minute. Pt reports weakness, SOB, with the pain. Denies hx of MI. VSS.

## 2018-01-09 NOTE — ED Provider Notes (Signed)
[facility] EMERGENCY DEPARTMENT Provider Note   CSN: [id number] Arrival date & time: 01/09/18  1307     History   Chief Complaint Chief Complaint  Patient presents with  . Fall  . Chest Pain    HPI [name] [name] is a 57 y.o. female.  Pt presents to the ED today with cp which started about 4 days ago.  She also c/o right ankle pain after a fall today.  The pt said the cp comes and goes and feels like indigestion.  She fell in the store today and is mainly here for her ankle which is throbbing, but while she's here, she wants to get her heart checked.     Past Medical History:  Diagnosis Date  . Anemia   . Chronic hepatitis C without hepatic coma ([place]) 11/09/2016  . Diabetes mellitus   . Fibroids   . 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 Hypertension   . MRSA (methicillin resistant Staphylococcus aureus)   . Trichomonas   . VAGINITIS, BACTERIAL, RECURRENT 08/15/2007   Qualifier: Diagnosis of  By: Radene Ou MD, [place]      Patient Active Problem List   Diagnosis Date Noted  . Diabetic neuropathy ([place]) 06/16/2017  . Non compliance w medication regimen 04/12/2017  . Diarrhea 11/17/2016  . Family history of colon cancer in mother 11/17/2016  . Dyspareunia in female 11/11/2016  . Screen for colon cancer 11/11/2016  . History of ovarian cyst 11/11/2016  . Chronic hepatitis C without hepatic coma ([place]) 11/09/2016  . Hyperlipidemia 10/07/2016  . Type 2 diabetes mellitus ([place]) 01/29/2014  . BREAST PAIN, BILATERAL 02/19/2010  . ANEMIA, IRON DEFICIENCY 10/03/2009  . LEUKOPENIA, MILD 09/19/2009  . CHEST PAIN UNSPECIFIED 09/19/2009  . LIPOMA 06/18/2009  . 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 TINEA PEDIS 05/07/2009  . SKIN LESION 05/07/2009  . COUGH 05/07/2009  . URI 04/04/2007  . SUBSTANCE ABUSE, MULTIPLE 02/22/2007  . Essential hypertension 02/22/2007    Past Surgical History:  Procedure Laterality Date  .  CESAREAN SECTION     breech     OB History    Gravida  1   Para  1   Term  1   Preterm      AB      Living  1     SAB      TAB      Ectopic      Multiple      Live Births  1            Home Medications    Prior to Admission medications   Medication Sig Start Date End Date Taking? Authorizing Provider  albuterol (PROVENTIL HFA;VENTOLIN HFA) 108 (90 Base) MCG/ACT inhaler Inhale 2 puffs into the lungs every 6 (six) hours as needed for wheezing or shortness of breath. 04/12/17  Yes [name], MD  Insulin Glargine (LANTUS SOLOSTAR) 100 UNIT/ML Solostar Pen Inject 50 Units into the skin at bedtime. 01/05/18  Yes McClung, Angela M, PA-C  liraglutide (VICTOZA) 18 MG/3ML SOPN Inject 0.3 mLs (1.8 mg total) into the skin daily with breakfast. Start with 0.44mL for 1 week then 0.2 mL for 1 week then 0.3 mL thereafter. 01/05/18  Yes McClung, Dionne Bucy, PA-C  ACCU-CHEK SOFTCLIX LANCETS lancets Use to test blood sugar 3 times daily as instructed. Dx code: 250.50 10/07/16   [name], MD  amLODipine (NORVASC) 10 MG tablet Take 1 tablet (  10 mg total) by mouth daily. 01/05/18   Argentina Donovan, PA-C  atorvastatin (LIPITOR) 20 MG tablet Take 1 tablet (20 mg total) by mouth daily. 01/05/18   Argentina Donovan, PA-C  azithromycin (ZITHROMAX) 250 MG tablet Take 2 today then 1 daily 01/05/18   Argentina Donovan, PA-C  Blood Glucose Monitoring Suppl (ACCU-CHEK AVIVA) device Use as instructed daily. 10/07/16   [name], MD  diphenoxylate-atropine (LOMOTIL) 2.5-0.025 MG tablet Take 1 tablet by mouth 3 (three) times daily with meals. 10/26/17   [name], MD  estradiol (ESTRING) 2 MG vaginal ring Place 2 mg vaginally every 3 (three) months. follow package directions Patient not taking: Reported on 09/22/2017 03/18/17   Aletha Halim, MD  famotidine (PEPCID) 20 MG tablet Take 1 tablet (20 mg total) by mouth 2 (two) times daily. 01/09/18   Isla Pence, MD  glucose blood  (ACCU-CHEK AVIVA PLUS) test strip Use to test blood sugar 3 times daily as instructed. Dx code: E11.319 10/07/16   [name], MD  HYDROcodone-acetaminophen (NORCO/VICODIN) 5-325 MG tablet Take 1 tablet by mouth every 4 (four) hours as needed. 01/09/18   Isla Pence, MD  Insulin Pen Needle (BD PEN NEEDLE NANO U/F) 32G X 4 MM MISC Use to inject insulin 4 times daily. 01/05/18   Argentina Donovan, PA-C  lisinopril-hydrochlorothiazide (ZESTORETIC) 20-12.5 MG tablet Take 2 tablets by mouth daily. 01/05/18   Argentina Donovan, PA-C  nitroGLYCERIN (NITROSTAT) 0.4 MG SL tablet Place 0.4 mg under the tongue every 5 (five) minutes as needed for chest pain.    [provider]  ondansetron (ZOFRAN ODT) 8 MG disintegrating tablet 8mg  ODT q4 hours prn nausea 09/22/17   Veryl Speak, MD  pregabalin (LYRICA) 75 MG capsule Take 1 capsule (75 mg total) by mouth 2 (two) times daily. 01/05/18   Argentina Donovan, PA-C  progesterone (PROMETRIUM) 100 MG capsule Take 1 capsule (100 mg total) by mouth daily. Patient not taking: Reported on 02/01/2017 11/22/16   Aletha Halim, MD  promethazine (PHENERGAN) 25 MG suppository Place 1 suppository (25 mg total) rectally every 8 (eight) hours as needed for nausea or vomiting. Patient not taking: Reported on 01/05/2018 09/22/17   [name], MD    Family History Family History  Problem Relation Age of Onset  . Colon cancer Mother   . Other Neg Hx   . Breast cancer Neg Hx     Social History Social History   Tobacco Use  . Smoking status: Never Smoker  . Smokeless tobacco: Never Used  Substance Use Topics  . Alcohol use: No  . Drug use: No    Types: Cocaine, Marijuana    Comment: 8 years ago for the cocaine  1 year ago with marijuana     Allergies   Patient has no known allergies.   Review of Systems Review of Systems  Cardiovascular: Positive for chest pain.  Musculoskeletal:       Right ankle pain  All other systems reviewed and are  negative.    Physical Exam Updated Vital Signs BP (!) 156/96   Pulse 91   Temp 97.7 F (36.5 C) (Oral)   Resp 17   Ht 5\' 6"  (1.676 m)   Wt 108 kg (238 lb)   LMP 04/01/2010   SpO2 97%   BMI 38.41 kg/m   Physical Exam  Constitutional: She is oriented to person, place, and time. She appears well-developed and well-nourished.  HENT:  Head: Normocephalic and atraumatic.  Eyes: Pupils are equal, round, and reactive to light. EOM are normal.  Neck: Normal range of motion. Neck supple.  Cardiovascular: Normal rate, regular rhythm, intact distal pulses and normal pulses.  Pulmonary/Chest: Effort normal and breath sounds normal.  Abdominal: Soft. Bowel sounds are normal.  Genitourinary: Rectal exam shows guaiac negative stool.  Musculoskeletal:       Right ankle: She exhibits decreased range of motion and swelling. Tenderness.       Right lower leg: Normal.       Left lower leg: Normal.  Neurological: She is alert and oriented to person, place, and time.  Skin: Skin is warm and dry.  Psychiatric: She has a normal mood and affect. Her behavior is normal.  Nursing note and vitals reviewed.    ED Treatments / Results  Labs (all labs ordered are listed, but only abnormal results are displayed) Labs Reviewed  BASIC METABOLIC PANEL - Abnormal; Notable for the following components:      Result Value   Glucose, Bld 110 (*)    BUN 28 (*)    Creatinine, Ser 1.08 (*)    Calcium 8.8 (*)    GFR calc non Af Amer 56 (*)    All other components within normal limits  CBC - Abnormal; Notable for the following components:   WBC 3.9 (*)    RBC 3.59 (*)    Hemoglobin 9.4 (*)    HCT 30.7 (*)    All other components within normal limits  CBG MONITORING, ED - Abnormal; Notable for the following components:   Glucose-Capillary 158 (*)    All other components within normal limits  I-STAT TROPONIN, ED  I-STAT BETA HCG BLOOD, ED ([facility], [facility], [facility] ONLY)  I-STAT TROPONIN, ED  POC OCCULT BLOOD, ED     EKG EKG Interpretation  Date/Time:  Monday January 09 2018 13:19:52 EDT Ventricular Rate:  92 PR Interval:  146 QRS Duration: 88 QT Interval:  380 QTC Calculation: 469 R Axis:   4 Text Interpretation:  Normal sinus rhythm Left ventricular hypertrophy Abnormal ECG No significant change since last tracing Confirmed by Isla Pence [phone]) on 01/09/2018 4:35:35 PM   Radiology Dg Chest 2 View  Result Date: 01/09/2018 CLINICAL DATA:  Chest pain following fall today, initial encounter EXAM: CHEST - 2 VIEW COMPARISON:  09/21/2017 FINDINGS: The heart size and mediastinal contours are within normal limits. Both lungs are clear. The visualized skeletal structures are unremarkable. IMPRESSION: No active cardiopulmonary disease. Electronically Signed   By: Inez Catalina M.D.   On: 01/09/2018 14:41   Dg Ankle Complete Right  Result Date: 01/09/2018 CLINICAL DATA:  Recent fall with right ankle pain, initial encounter EXAM: RIGHT ANKLE - COMPLETE 3+ VIEW COMPARISON:  07/14/2015 FINDINGS: Considerable soft tissue swelling is noted about the ankle joint. No acute fracture or dislocation is noted. Mild tarsal degenerative changes are seen. IMPRESSION: Soft tissue swelling without acute bony abnormality. Electronically Signed   By: Inez Catalina M.D.   On: 01/09/2018 14:40   Dg Foot Complete Right  Result Date: 01/09/2018 CLINICAL DATA:  Recent fall with right foot pain, initial encounter EXAM: RIGHT FOOT COMPLETE - 3+ VIEW COMPARISON:  None. FINDINGS: Mild tarsal degenerative changes are noted. No acute fracture or dislocation is seen. Soft tissue swelling about the ankle is noted. No other focal abnormality is seen. IMPRESSION: Soft tissue swelling without acute bony abnormality. Electronically Signed   By: Inez Catalina M.D.   On: 01/09/2018 14:48  Procedures Procedures (including critical care time)  Medications Ordered in ED Medications - No data to display   Initial Impression / Assessment  and Plan / ED Course  I have reviewed the triage vital signs and the nursing notes.  Pertinent labs & imaging results that were available during my care of the patient were reviewed by me and considered in my medical decision making (see chart for details).    Pt feels much better.  She is placed in a splint with crutches.  She is slightly anemic, but stool is negative for blood.  She said she's been having chronic diarrhea that she's been unable to control.  ? From dm meds vs diet.  Unlikely to be infectious.  She is instructed to f/u with pcp and with gi and ortho.  Return if worse.  Final Clinical Impressions(s) / ED Diagnoses   Final diagnoses:  Atypical chest pain  Sprain of right ankle, unspecified ligament, initial encounter  Anemia, unspecified type  Diarrhea, unspecified type    ED Discharge Orders        Ordered    famotidine (PEPCID) 20 MG tablet  2 times daily     01/09/18 1820    HYDROcodone-acetaminophen (NORCO/VICODIN) 5-325 MG tablet  Every 4 hours PRN     01/09/18 1820       Isla Pence, MD 01/09/18 1824

## 2018-01-09 NOTE — ED Provider Notes (Signed)
Patient placed in Quick Look pathway, seen and evaluated   Chief Complaint: fall, right leg pain, chest pain  HPI:   [name] [name] is a 57 y.o. female with hx of HTN and DM who presents to the ED s/p fall. Patient reports she was in a store and fell over a rack causing her to fall on the concrete floor. Patient c/o chest pain, but states that has been going on for 4 days off and on, right ankle and foot pain.   ROS: M/S: right foot and ankle pain  Physical Exam:  BP (!) 140/95 (BP Location: Right Arm)   Pulse 92   Temp 97.7 F (36.5 C) (Oral)   Resp 20   Ht 5\' 6"  (1.676 m)   Wt 108 kg (238 lb)   LMP 04/01/2010   SpO2 96%   BMI 38.41 kg/m    Gen: No distress  Neuro: Awake and Alert  Skin: Warm and dry, swelling, ecchymosis and abrasion to the right ankle and foot. Tender on palpation, pedal pulse 2+, adequate circulation.        Initiation of care has begun. The patient has been counseled on the process, plan, and necessity for staying for the completion/evaluation, and the remainder of the medical screening examination    Ashley Murrain, NP 01/09/18 1334    Julianne Rice, MD 01/10/18 [phone]

## 2018-01-10 NOTE — ED Notes (Signed)
1mg  Ativan wasted in sharps with Cherylann Ratel, RN

## 2018-01-26 ENCOUNTER — Other Ambulatory Visit: Payer: Self-pay | Admitting: *Deleted

## 2018-01-27 ENCOUNTER — Encounter: Payer: Self-pay | Admitting: Family Medicine

## 2018-01-27 ENCOUNTER — Other Ambulatory Visit: Payer: Self-pay

## 2018-01-27 ENCOUNTER — Ambulatory Visit: Payer: Medicaid Other | Attending: Family Medicine | Admitting: Family Medicine

## 2018-01-27 VITALS — BP 147/98 | HR 95 | Temp 98.0°F | Resp 16 | Wt 229.0 lb

## 2018-01-27 DIAGNOSIS — Z79899 Other long term (current) drug therapy: Secondary | ICD-10-CM | POA: Diagnosis not present

## 2018-01-27 DIAGNOSIS — R197 Diarrhea, unspecified: Secondary | ICD-10-CM | POA: Diagnosis not present

## 2018-01-27 DIAGNOSIS — B182 Chronic viral hepatitis C: Secondary | ICD-10-CM | POA: Insufficient documentation

## 2018-01-27 DIAGNOSIS — R05 Cough: Secondary | ICD-10-CM | POA: Diagnosis not present

## 2018-01-27 DIAGNOSIS — E1169 Type 2 diabetes mellitus with other specified complication: Secondary | ICD-10-CM

## 2018-01-27 DIAGNOSIS — R059 Cough, unspecified: Secondary | ICD-10-CM

## 2018-01-27 DIAGNOSIS — K529 Noninfective gastroenteritis and colitis, unspecified: Secondary | ICD-10-CM | POA: Diagnosis not present

## 2018-01-27 DIAGNOSIS — I10 Essential (primary) hypertension: Secondary | ICD-10-CM | POA: Diagnosis not present

## 2018-01-27 DIAGNOSIS — E11311 Type 2 diabetes mellitus with unspecified diabetic retinopathy with macular edema: Secondary | ICD-10-CM | POA: Diagnosis not present

## 2018-01-27 DIAGNOSIS — E1140 Type 2 diabetes mellitus with diabetic neuropathy, unspecified: Secondary | ICD-10-CM | POA: Insufficient documentation

## 2018-01-27 DIAGNOSIS — E785 Hyperlipidemia, unspecified: Secondary | ICD-10-CM | POA: Insufficient documentation

## 2018-01-27 DIAGNOSIS — E1149 Type 2 diabetes mellitus with other diabetic neurological complication: Secondary | ICD-10-CM

## 2018-01-27 DIAGNOSIS — Z794 Long term (current) use of insulin: Secondary | ICD-10-CM | POA: Insufficient documentation

## 2018-01-27 LAB — GLUCOSE, POCT (MANUAL RESULT ENTRY): POC Glucose: 187 mg/dl — AB (ref 70–99)

## 2018-01-27 MED ORDER — CETIRIZINE HCL 10 MG PO TABS
10.0000 mg | ORAL_TABLET | Freq: Every day | ORAL | 1 refills | Status: DC
Start: 2018-01-27 — End: 2018-02-07

## 2018-01-27 MED ORDER — ACETAMINOPHEN-CODEINE #3 300-30 MG PO TABS: 1.0000 | ORAL_TABLET | Freq: Two times a day (BID) | ORAL | 0 refills | Status: DC | PRN

## 2018-01-27 MED ORDER — INSULIN GLARGINE 100 UNIT/ML SOLOSTAR PEN: 60.0000 [IU] | PEN_INJECTOR | Freq: Every day | SUBCUTANEOUS | 5 refills | Status: DC

## 2018-01-27 MED ORDER — PREGABALIN 75 MG PO CAPS: 75.0000 mg | ORAL_CAPSULE | Freq: Two times a day (BID) | ORAL | 3 refills | Status: DC

## 2018-01-27 MED ORDER — BENZONATATE 100 MG PO CAPS: 100.0000 mg | ORAL_CAPSULE | Freq: Two times a day (BID) | ORAL | 0 refills | Status: DC | PRN

## 2018-01-27 MED ORDER — DIPHENOXYLATE-ATROPINE 2.5-0.025 MG PO TABS: 1.0000 | ORAL_TABLET | Freq: Three times a day (TID) | ORAL | 0 refills | Status: DC

## 2018-01-27 NOTE — Progress Notes (Signed)
Subjective:  Patient ID: [name] [name], female    DOB: July 30, 1960  Age: 57 y.o. MRN: [id number]  CC: Cough and Diarrhea   HPI [name] [name] is a 57 year old female with a history of type 2 diabetes mellitus (A1c 9.8), diabetic neuropathy hypertension, diabetic retinopathy, hyperlipidemia, hepatitis C (completed treatment with harvoni) who presents today for an acute visit. She complains of one-week history of cough with postnasal drip and has had some nasal and chest congestion, sinus tenderness, fever.  Cough has been productive of yellowish blood tinged sputum and she has used Mucinex with no relief.  She denies the presence of fever or myalgias. She has had diarrhea for the last 4 days; the diarrhea has been intermittent with periods of breaks for a couple of weeks after which symptoms return and they have been unrelated to meals.  She also has abdominal cramping. She complains of right ankle pain and swelling since 01/09/2018 where she fell at a departmental store.  ED evaluation by means of right ankle x-ray reveals soft tissue swelling without bony abnormality and she was prescribed hydrocodone which she is currently taking.  Pain is described as a 40//10 at this time. She endorses compliance with her Lantus and other diabetic medications and denies hypoglycemia.  She is not compliant with a diabetic diet and does not exercise.  Past Medical History:  Diagnosis Date  . Anemia   . Chronic hepatitis C without hepatic coma ([place]) 11/09/2016  . Diabetes mellitus   . Fibroids   . 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 Hypertension   . MRSA (methicillin resistant Staphylococcus aureus)   . Trichomonas   . VAGINITIS, BACTERIAL, RECURRENT 08/15/2007   Qualifier: Diagnosis of  By: Radene Ou MD, [place]      Past Surgical History:  Procedure Laterality Date  . CESAREAN SECTION     breech    No Known Allergies   Outpatient Medications Prior to Visit    Medication Sig Dispense Refill  . amLODipine (NORVASC) 10 MG tablet Take 1 tablet (10 mg total) by mouth daily. 30 tablet 5  . atorvastatin (LIPITOR) 20 MG tablet Take 1 tablet (20 mg total) by mouth daily. 30 tablet 5  . liraglutide (VICTOZA) 18 MG/3ML SOPN Inject 0.3 mLs (1.8 mg total) into the skin daily with breakfast. Start with 0.104mL for 1 week then 0.2 mL for 1 week then 0.3 mL thereafter. 30 mL 3  . lisinopril-hydrochlorothiazide (ZESTORETIC) 20-12.5 MG tablet Take 2 tablets by mouth daily. 60 tablet 5  . nitroGLYCERIN (NITROSTAT) 0.4 MG SL tablet Place 0.4 mg under the tongue every 5 (five) minutes as needed for chest pain.    . Insulin Glargine (LANTUS SOLOSTAR) 100 UNIT/ML Solostar Pen Inject 50 Units into the skin at bedtime. 5 pen 5  . ACCU-CHEK SOFTCLIX LANCETS lancets Use to test blood sugar 3 times daily as instructed. Dx code: 250.50 100 each 11  . albuterol (PROVENTIL HFA;VENTOLIN HFA) 108 (90 Base) MCG/ACT inhaler Inhale 2 puffs into the lungs every 6 (six) hours as needed for wheezing or shortness of breath. (Patient not taking: Reported on 01/27/2018) 1 Inhaler 3  . Blood Glucose Monitoring Suppl (ACCU-CHEK AVIVA) device Use as instructed daily. 1 each 0  . estradiol (ESTRING) 2 MG vaginal ring Place 2 mg vaginally every 3 (three) months. follow package directions (Patient not taking: Reported on 09/22/2017) 1 each 12  . famotidine (PEPCID) 20 MG tablet Take 1 tablet (  20 mg total) by mouth 2 (two) times daily. (Patient not taking: Reported on 01/27/2018) 30 tablet 0  . glucose blood (ACCU-CHEK AVIVA PLUS) test strip Use to test blood sugar 3 times daily as instructed. Dx code: E11.319 100 each 11  . HYDROcodone-acetaminophen (NORCO/VICODIN) 5-325 MG tablet Take 1 tablet by mouth every 4 (four) hours as needed. (Patient not taking: Reported on 01/27/2018) 10 tablet 0  . Insulin Pen Needle (BD PEN NEEDLE NANO U/F) 32G X 4 MM MISC Use to inject insulin 4 times daily. 150 each 11  .  ondansetron (ZOFRAN ODT) 8 MG disintegrating tablet 8mg  ODT q4 hours prn nausea (Patient not taking: Reported on 01/27/2018) 6 tablet 0  . progesterone (PROMETRIUM) 100 MG capsule Take 1 capsule (100 mg total) by mouth daily. (Patient not taking: Reported on 02/01/2017) 30 capsule 3  . promethazine (PHENERGAN) 25 MG suppository Place 1 suppository (25 mg total) rectally every 8 (eight) hours as needed for nausea or vomiting. (Patient not taking: Reported on 01/05/2018) 12 each 0  . azithromycin (ZITHROMAX) 250 MG tablet Take 2 today then 1 daily 6 tablet 0  . diphenoxylate-atropine (LOMOTIL) 2.5-0.025 MG tablet Take 1 tablet by mouth 3 (three) times daily with meals. (Patient not taking: Reported on 01/27/2018) 30 tablet 0  . pregabalin (LYRICA) 75 MG capsule Take 1 capsule (75 mg total) by mouth 2 (two) times daily. (Patient not taking: Reported on 01/27/2018) 60 capsule 3   Facility-Administered Medications Prior to Visit  Medication Dose Route Frequency Provider Last Rate Last Dose  . 0.9 %  sodium chloride infusion  500 mL Intravenous Continuous Irene Shipper, MD        ROS Review of Systems  Constitutional: Negative for activity change, appetite change and fatigue.  HENT: Negative for congestion, sinus pressure and sore throat.   Eyes: Negative for visual disturbance.  Respiratory: Positive for cough. Negative for chest tightness, shortness of breath and wheezing.   Cardiovascular: Negative for chest pain and palpitations.  Gastrointestinal: Positive for diarrhea. Negative for abdominal distention, abdominal pain and constipation.  Endocrine: Negative for polydipsia.  Genitourinary: Negative for dysuria and frequency.  Musculoskeletal: Negative for arthralgias and back pain.  Skin: Negative for rash.  Neurological: Negative for tremors, light-headedness and numbness.  Hematological: Does not bruise/bleed easily.  Psychiatric/Behavioral: Negative for agitation and behavioral problems.     Objective:  BP (!) 147/98 (BP Location: Left Arm, Patient Position: Sitting, Cuff Size: Large)   Pulse 95   Temp 98 F (36.7 C)   Resp 16   Wt 229 lb (103.9 kg)   LMP 04/01/2010   SpO2 97%   BMI 36.96 kg/m   BP/Weight 01/27/2018 01/09/2018 10/15/5186  Systolic BP 416 606 301  Diastolic BP 98 56 82  Wt. (Lbs) 229 238 234  BMI 36.96 38.41 37.77      Physical Exam  Constitutional: She is oriented to person, place, and time. She appears well-developed and well-nourished.  Cardiovascular: Normal rate, normal heart sounds and intact distal pulses.  No murmur heard. Pulmonary/Chest: Effort normal and breath sounds normal. She has no wheezes. She has no rales. She exhibits no tenderness.  Abdominal: Soft. Bowel sounds are normal. She exhibits no distension and no mass. There is no tenderness.  Musculoskeletal: Normal range of motion.  Neurological: She is alert and oriented to person, place, and time.  Skin: Skin is warm and dry.  Psychiatric: She has a normal mood and affect.     CMP  Latest Ref Rng & Units 01/09/2018 09/22/2017 09/21/2017  Glucose 65 - 99 mg/dL 110(H) 455(H) 418(H)  BUN 6 - 20 mg/dL 28(H) 27(H) 22(H)  Creatinine 0.44 - 1.00 mg/dL 1.08(H) 1.63(H) 1.57(H)  Sodium 135 - 145 mmol/L 140 130(L) 134(L)  Potassium 3.5 - 5.1 mmol/L 3.7 4.1 3.9  Chloride 101 - 111 mmol/L 106 92(L) 91(L)  CO2 22 - 32 mmol/L 22 26 27   Calcium 8.9 - 10.3 mg/dL 8.8(L) 8.9 9.1  Total Protein 6.0 - 8.5 g/dL - - -  Total Bilirubin 0.0 - 1.2 mg/dL - - -  Alkaline Phos 39 - 117 IU/L - - -  AST 0 - 40 IU/L - - -  ALT 0 - 32 IU/L - - -    Lipid Panel     Component Value Date/Time   CHOL 158 08/16/2017 1214   TRIG 93 08/16/2017 1214   HDL 65 08/16/2017 1214   CHOLHDL 2.4 08/16/2017 1214   CHOLHDL 3 06/18/2014 0951   VLDL 19.6 06/18/2014 0951   LDLCALC 74 08/16/2017 1214    Lab Results  Component Value Date   HGBA1C 9.8 (A) 01/05/2018    Assessment & Plan:   1.  Gastroenteritis Increase fluid intake - diphenoxylate-atropine (LOMOTIL) 2.5-0.025 MG tablet; Take 1 tablet by mouth 3 (three) times daily with meals.  Dispense: 30 tablet; Refill: 0  2. Type 2 diabetes mellitus with other neurologic complication, with long-term current use of insulin (HCC) Currently on Lyrica - Insulin Glargine (LANTUS SOLOSTAR) 100 UNIT/ML Solostar Pen; Inject 60 Units into the skin at bedtime.  Dispense: 5 pen; Refill: 5  3. 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  Dispense: 5 pen; Refill: 5  4. Type 2 diabetes mellitus with other specified complication, with long-term current use of insulin (HCC) Uncontrolled with A1c of 9.8 Increased dose of Lantus Counseled on Diabetic diet, my plate method, 062 minutes of moderate intensity exercise/week Keep blood sugar logs with fasting goals of 80-120 mg/dl, random of less than 180 and in the event of sugars less than 60 mg/dl or greater than 400 mg/dl please notify the clinic ASAP. It is recommended that you undergo annual eye exams and annual foot exams. Pneumonia vaccine is recommended. - pregabalin (LYRICA) 75 MG capsule; Take 1 capsule (75 mg total) by mouth 2 (two) times daily.  Dispense: 60 capsule; Refill: 3  5. Diarrhea, unspecified type Unknown etiology We will check for pancreatic insufficiency - Fecal fat, qualitative - Pancreatic Elastase, Fecal  6. Cough Likely from postnasal drip - cetirizine (ZYRTEC) 10 MG tablet; Take 1 tablet (10 mg total) by mouth daily.  Dispense: 30 tablet; Refill: 1 - benzonatate (TESSALON) 100 MG capsule; Take 1 capsule (100 mg total) by mouth 2 (two) times daily as needed for cough.  Dispense: 20 capsule; Refill: 0  7. Essential  hypertension Slightly elevated No regimen change today Counseled on blood pressure goal of less than 130/80, low-sodium, DASH diet, medication compliance, 150 minutes of moderate intensity exercise per week. Discussed medication compliance, adverse effects.    Meds ordered this encounter  Medications  . diphenoxylate-atropine (LOMOTIL) 2.5-0.025 MG tablet    Sig: Take 1 tablet by mouth 3 (three) times daily with meals.    Dispense:  30 tablet    Refill:  0  . cetirizine (ZYRTEC) 10 MG tablet    Sig: Take 1 tablet (10 mg total) by mouth daily.    Dispense:  30 tablet    Refill:  1  . benzonatate (TESSALON) 100 MG capsule    Sig: Take 1 capsule (100 mg total) by mouth 2 (two) times daily as needed for cough.    Dispense:  20 capsule    Refill:  0  . acetaminophen-codeine (TYLENOL #3) 300-30 MG tablet    Sig: Take 1 tablet by mouth every 12 (twelve) hours as needed for moderate pain.    Dispense:  30 tablet    Refill:  0  . Insulin Glargine (LANTUS SOLOSTAR) 100 UNIT/ML Solostar Pen    Sig: Inject 60 Units into the skin at bedtime.    Dispense:  5 pen    Refill:  5    Discontinue previous dose  . pregabalin (LYRICA) 75 MG capsule    Sig: Take 1 capsule (75 mg total) by mouth 2 (two) times daily.    Dispense:  60 capsule    Refill:  3    Follow-up: Return for follow up of chronic medical conditions, keep previously scheduled appointment.   [name] MD

## 2018-01-27 NOTE — Progress Notes (Signed)
Diarrhea 1 week  Cough x with yellow bloody phlegm. Cough x 1 week, worse at night. No relief with OTC medication Nyquil/ Mucinex  Address referral from a fall in the store  Asked for refill on lomotil, lyrica

## 2018-01-27 NOTE — Patient Instructions (Signed)
Diarrhea, Adult °Diarrhea is when you have loose and water poop (stool) often. Diarrhea can make you feel weak and cause you to get dehydrated. Dehydration can make you tired and thirsty, make you have a dry mouth, and make it so you pee (urinate) less often. Diarrhea often lasts 2-3 days. However, it can last longer if it is a sign of something more serious. It is important to treat your diarrhea as told by your doctor. °Follow these instructions at home: °Eating and drinking ° °Follow these recommendations as told by your doctor: °· Take an oral rehydration solution (ORS). This is a drink that is sold at pharmacies and stores. °· Drink clear fluids, such as: °? Water. °? Ice chips. °? Diluted fruit juice. °? Low-calorie sports drinks. °· Eat bland, easy-to-digest foods in small amounts as you are able. These foods include: °? Bananas. °? Applesauce. °? Rice. °? Low-fat (lean) meats. °? Toast. °? Crackers. °· Avoid drinking fluids that have a lot of sugar or caffeine in them. °· Avoid alcohol. °· Avoid spicy or fatty foods. ° °General instructions ° °· Drink enough fluid to keep your pee (urine) clear or pale yellow. °· Wash your hands often. If you cannot use soap and water, use hand sanitizer. °· Make sure that all people in your home wash their hands well and often. °· Take over-the-counter and prescription medicines only as told by your doctor. °· Rest at home while you get better. °· Watch your condition for any changes. °· Take a warm bath to help with any burning or pain from having diarrhea. °· Keep all follow-up visits as told by your doctor. This is important. °Contact a doctor if: °· You have a fever. °· Your diarrhea gets worse. °· You have new symptoms. °· You cannot keep fluids down. °· You feel light-headed or dizzy. °· You have a headache. °· You have muscle cramps. °Get help right away if: °· You have chest pain. °· You feel very weak or you pass out (faint). °· You have bloody or black poop or  poop that look like tar. °· You have very bad pain, cramping, or bloating in your belly (abdomen). °· You have trouble breathing or you are breathing very quickly. °· Your heart is beating very quickly. °· Your skin feels cold and clammy. °· You feel confused. °· You have signs of dehydration, such as: °? Dark pee, hardly any pee, or no pee. °? Cracked lips. °? Dry mouth. °? Sunken eyes. °? Sleepiness. °? Weakness. °This information is not intended to replace advice given to you by your health care provider. Make sure you discuss any questions you have with your health care provider. °Document Released: 12/22/2007 Document Revised: 01/23/2016 Document Reviewed: 03/11/2015 °Elsevier Interactive Patient Education © 2018 [employer] ° °

## 2018-02-07 ENCOUNTER — Ambulatory Visit (HOSPITAL_COMMUNITY)
Admission: EM | Admit: 2018-02-07 | Discharge: 2018-02-07 | Disposition: A | Discharge status: Home / Self Care | Payer: Medicaid Other | Attending: Family Medicine | Admitting: Family Medicine

## 2018-02-07 ENCOUNTER — Encounter (HOSPITAL_COMMUNITY): Payer: Self-pay

## 2018-02-07 DIAGNOSIS — R059 Cough, unspecified: Secondary | ICD-10-CM

## 2018-02-07 DIAGNOSIS — R05 Cough: Secondary | ICD-10-CM

## 2018-02-07 DIAGNOSIS — L259 Unspecified contact dermatitis, unspecified cause: Secondary | ICD-10-CM

## 2018-02-07 MED ORDER — TRIAMCINOLONE ACETONIDE 0.1 % EX CREA: 1.0000 "application " | TOPICAL_CREAM | Freq: Two times a day (BID) | CUTANEOUS | 0 refills | Status: DC

## 2018-02-07 MED ORDER — CETIRIZINE HCL 10 MG PO TABS: 10.0000 mg | ORAL_TABLET | Freq: Every day | ORAL | 1 refills | Status: DC

## 2018-02-07 NOTE — ED Provider Notes (Signed)
[facility]    CSN: [id number] Arrival date & time: 02/07/18  1884     History   Chief Complaint Chief Complaint  Patient presents with  . Allergic Reaction    HPI [name] [name] is a 57 y.o. female.   Patient is a 57 year old female with past medical history of diabetes, hypertension, hep C, anemia.  She presents with 4 days of rash to bilateral lower extremities.  The rash has been pruritic and worsening.  She has been using anti-itch cream and alcohol for the rash.  She denies any fever, chills, body aches, fatigue.  This all started after going to the beach this past weekend.  She admits to getting in the water but unsure if she came in contact with any plants.  She denies any new foods, medications, lotions, body wash.  She denies any recent traveling out of the country or insect bites.   She is also having dry cough and phlegm in her throat.  Denies any sore throat, ear pain, rhinorrhea.   ROS per HPI      Past Medical History:  Diagnosis Date  . Anemia   . Chronic hepatitis C without hepatic coma ([place]) 11/09/2016  . Diabetes mellitus   . Fibroids   . 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 Hypertension   . MRSA (methicillin resistant Staphylococcus aureus)   . Trichomonas   . VAGINITIS, BACTERIAL, RECURRENT 08/15/2007   Qualifier: Diagnosis of  By: Radene Ou MD, [place]      Patient Active Problem List   Diagnosis Date Noted  . Diabetic neuropathy ([place]) 06/16/2017  . Non compliance w medication regimen 04/12/2017  . Diarrhea 11/17/2016  . Family history of colon cancer in mother 11/17/2016  . Dyspareunia in female 11/11/2016  . Screen for colon cancer 11/11/2016  . History of ovarian cyst 11/11/2016  . Chronic hepatitis C without hepatic coma ([place]) 11/09/2016  . Hyperlipidemia 10/07/2016  . Type 2 diabetes mellitus ([place]) 01/29/2014  . BREAST PAIN, BILATERAL 02/19/2010  . ANEMIA, IRON DEFICIENCY 10/03/2009  .  LEUKOPENIA, MILD 09/19/2009  . CHEST PAIN UNSPECIFIED 09/19/2009  . LIPOMA 06/18/2009  . 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 TINEA PEDIS 05/07/2009  . SKIN LESION 05/07/2009  . COUGH 05/07/2009  . URI 04/04/2007  . SUBSTANCE ABUSE, MULTIPLE 02/22/2007  . Essential hypertension 02/22/2007    Past Surgical History:  Procedure Laterality Date  . CESAREAN SECTION     breech    OB History    Gravida  1   Para  1   Term  1   Preterm      AB      Living  1     SAB      TAB      Ectopic      Multiple      Live Births  1            Home Medications    Prior to Admission medications   Medication Sig Start Date End Date Taking? Authorizing Provider  ACCU-CHEK SOFTCLIX LANCETS lancets Use to test blood sugar 3 times daily as instructed. Dx code: 250.50 10/07/16   [name], MD  acetaminophen-codeine (TYLENOL #3) 300-30 MG tablet Take 1 tablet by mouth every 12 (twelve) hours as needed for moderate pain. 01/27/18   [name], MD  albuterol (PROVENTIL HFA;VENTOLIN HFA) 108 (90 Base) MCG/ACT inhaler Inhale 2 puffs into the lungs every 6 (  six) hours as needed for wheezing or shortness of breath. Patient not taking: Reported on 01/27/2018 04/12/17   [name], MD  amLODipine (NORVASC) 10 MG tablet Take 1 tablet (10 mg total) by mouth daily. 01/05/18   Argentina Donovan, PA-C  atorvastatin (LIPITOR) 20 MG tablet Take 1 tablet (20 mg total) by mouth daily. 01/05/18   Argentina Donovan, PA-C  benzonatate (TESSALON) 100 MG capsule Take 1 capsule (100 mg total) by mouth 2 (two) times daily as needed for cough. 01/27/18   [name], MD  Blood Glucose Monitoring Suppl (ACCU-CHEK AVIVA) device Use as instructed daily. 10/07/16   [name], MD  cetirizine (ZYRTEC) 10 MG tablet Take 1 tablet (10 mg total) by mouth daily. 02/07/18   Loura Halt A, NP  diphenoxylate-atropine (LOMOTIL) 2.5-0.025 MG tablet Take 1 tablet by mouth 3 (three) times daily with  meals. 01/27/18   [name], MD  estradiol (ESTRING) 2 MG vaginal ring Place 2 mg vaginally every 3 (three) months. follow package directions Patient not taking: Reported on 09/22/2017 03/18/17   Aletha Halim, MD  famotidine (PEPCID) 20 MG tablet Take 1 tablet (20 mg total) by mouth 2 (two) times daily. Patient not taking: Reported on 01/27/2018 01/09/18   Isla Pence, MD  glucose blood (ACCU-CHEK AVIVA PLUS) test strip Use to test blood sugar 3 times daily as instructed. Dx code: E11.319 10/07/16   [name], MD  HYDROcodone-acetaminophen (NORCO/VICODIN) 5-325 MG tablet Take 1 tablet by mouth every 4 (four) hours as needed. Patient not taking: Reported on 01/27/2018 01/09/18   Isla Pence, MD  Insulin Glargine (LANTUS SOLOSTAR) 100 UNIT/ML Solostar Pen Inject 60 Units into the skin at bedtime. 01/27/18   [name], MD  Insulin Pen Needle (BD PEN NEEDLE NANO U/F) 32G X 4 MM MISC Use to inject insulin 4 times daily. 01/05/18   Argentina Donovan, PA-C  liraglutide (VICTOZA) 18 MG/3ML SOPN Inject 0.3 mLs (1.8 mg total) into the skin daily with breakfast. Start with 0.69mL for 1 week then 0.2 mL for 1 week then 0.3 mL thereafter. 01/05/18   Argentina Donovan, PA-C  lisinopril-hydrochlorothiazide (ZESTORETIC) 20-12.5 MG tablet Take 2 tablets by mouth daily. 01/05/18   Argentina Donovan, PA-C  nitroGLYCERIN (NITROSTAT) 0.4 MG SL tablet Place 0.4 mg under the tongue every 5 (five) minutes as needed for chest pain.    [provider]  ondansetron (ZOFRAN ODT) 8 MG disintegrating tablet 8mg  ODT q4 hours prn nausea Patient not taking: Reported on 01/27/2018 09/22/17   Veryl Speak, MD  pregabalin (LYRICA) 75 MG capsule Take 1 capsule (75 mg total) by mouth 2 (two) times daily. 01/27/18   [name], MD  progesterone (PROMETRIUM) 100 MG capsule Take 1 capsule (100 mg total) by mouth daily. Patient not taking: Reported on 02/01/2017 11/22/16   Aletha Halim, MD  promethazine  (PHENERGAN) 25 MG suppository Place 1 suppository (25 mg total) rectally every 8 (eight) hours as needed for nausea or vomiting. Patient not taking: Reported on 01/05/2018 09/22/17   [name], MD  triamcinolone cream (KENALOG) 0.1 % Apply 1 application topically 2 (two) times daily. 02/07/18   Orvan July, NP    Family History Family History  Problem Relation Age of Onset  . Colon cancer Mother   . Other Neg Hx   . Breast cancer Neg Hx     Social History Social History   Tobacco Use  . Smoking status: Never Smoker  . Smokeless tobacco:  Never Used  Substance Use Topics  . Alcohol use: No  . Drug use: No    Types: Cocaine, Marijuana    Comment: 8 years ago for the cocaine  1 year ago with marijuana     Allergies   Patient has no known allergies.   Review of Systems Review of Systems   Physical Exam Triage Vital Signs ED Triage Vitals  Enc Vitals Group     BP 02/07/18 0949 (!) 146/84     Pulse Rate 02/07/18 0949 91     Resp 02/07/18 0949 20     Temp 02/07/18 0949 98.5 F (36.9 C)     Temp Source 02/07/18 0949 Oral     SpO2 02/07/18 0949 96 %     Weight --      Height --      Head Circumference --      Peak Flow --      Pain Score 02/07/18 0951 0     Pain Loc --      Pain Edu? --      Excl. in [place]? --    No data found.  Updated Vital Signs BP (!) 146/84 (BP Location: Right Arm)   Pulse 91   Temp 98.5 F (36.9 C) (Oral)   Resp 20   LMP 04/01/2010   SpO2 96%   Visual Acuity Right Eye Distance:   Left Eye Distance:   Bilateral Distance:    Right Eye Near:   Left Eye Near:    Bilateral Near:     Physical Exam  Constitutional: She is oriented to person, place, and time. She appears well-developed and well-nourished.  HENT:  Mouth/Throat: Oropharynx is clear and moist.  Neck: Normal range of motion.  Cardiovascular: Normal rate and regular rhythm.  Pulmonary/Chest: Effort normal.  Lymphadenopathy:    She has no cervical adenopathy.    Neurological: She is alert and oriented to person, place, and time.  Skin: Skin is warm and dry. Capillary refill takes less than 2 seconds.  Pruritic erythematous papular rash to bilateral shins extending into feet.   Psychiatric: She has a normal mood and affect.  Nursing note and vitals reviewed.    UC Treatments / Results  Labs (all labs ordered are listed, but only abnormal results are displayed) Labs Reviewed - No data to display  EKG None  Radiology No results found.  Procedures Procedures (including critical care time)  Medications Ordered in UC Medications - No data to display  Initial Impression / Assessment and Plan / UC Course  I have reviewed the triage vital signs and the nursing notes.  Pertinent labs & imaging results that were available during my care of the patient were reviewed by me and considered in my medical decision making (see chart for details).     Contact dermatitis versus miliaria rubra.  Will treat with triamcinolone cream and Zyrtec daily.  Follow-up as needed if symptoms worsen.  Return precautions given Final Clinical Impressions(s) / UC Diagnoses   Final diagnoses:  Contact dermatitis, unspecified contact dermatitis type, unspecified trigger     Discharge Instructions     It was nice meeting you!!  I believe you are having a reaction to some sort of irritant.  Please use the zyrtec once daily and triamcinolone cream twice daily.  Follow up here or PCP if no improvement or worsening symptoms.    ED Prescriptions    Medication Sig Dispense Auth. Provider   cetirizine (ZYRTEC) 10 MG tablet  Take 1 tablet (10 mg total) by mouth daily. 30 tablet Raisa Ditto A, NP   triamcinolone cream (KENALOG) 0.1 % Apply 1 application topically 2 (two) times daily. 30 g Loura Halt A, NP     Controlled Substance Prescriptions [place] Controlled Substance Registry consulted? Not Applicable   Orvan July, NP 02/07/18 1052

## 2018-02-07 NOTE — Discharge Instructions (Signed)
It was nice meeting you!!  I believe you are having a reaction to some sort of irritant.  Please use the zyrtec once daily and triamcinolone cream twice daily.  Follow up here or PCP if no improvement or worsening symptoms.

## 2018-02-07 NOTE — ED Triage Notes (Signed)
Pt presents with allergic reaction on both legs and feet

## 2018-02-20 ENCOUNTER — Other Ambulatory Visit: Payer: Self-pay | Admitting: Family Medicine

## 2018-02-20 DIAGNOSIS — K529 Noninfective gastroenteritis and colitis, unspecified: Secondary | ICD-10-CM

## 2018-02-20 DIAGNOSIS — R059 Cough, unspecified: Secondary | ICD-10-CM

## 2018-02-20 DIAGNOSIS — R05 Cough: Secondary | ICD-10-CM

## 2018-02-26 ENCOUNTER — Emergency Department (HOSPITAL_COMMUNITY): Payer: Medicaid Other

## 2018-02-26 ENCOUNTER — Emergency Department (HOSPITAL_COMMUNITY)
Admission: EM | Admit: 2018-02-26 | Discharge: 2018-02-27 | Disposition: A | Discharge status: Home / Self Care | Payer: Medicaid Other | Attending: Emergency Medicine | Admitting: Emergency Medicine

## 2018-02-26 ENCOUNTER — Encounter (HOSPITAL_COMMUNITY): Payer: Self-pay | Admitting: Emergency Medicine

## 2018-02-26 DIAGNOSIS — Z79899 Other long term (current) drug therapy: Secondary | ICD-10-CM | POA: Insufficient documentation

## 2018-02-26 DIAGNOSIS — R0789 Other chest pain: Secondary | ICD-10-CM

## 2018-02-26 DIAGNOSIS — E1140 Type 2 diabetes mellitus with diabetic neuropathy, unspecified: Secondary | ICD-10-CM | POA: Diagnosis not present

## 2018-02-26 DIAGNOSIS — I10 Essential (primary) hypertension: Secondary | ICD-10-CM | POA: Insufficient documentation

## 2018-02-26 DIAGNOSIS — R0602 Shortness of breath: Secondary | ICD-10-CM | POA: Diagnosis not present

## 2018-02-26 DIAGNOSIS — R2243 Localized swelling, mass and lump, lower limb, bilateral: Secondary | ICD-10-CM | POA: Diagnosis not present

## 2018-02-26 DIAGNOSIS — R062 Wheezing: Secondary | ICD-10-CM | POA: Insufficient documentation

## 2018-02-26 DIAGNOSIS — M7989 Other specified soft tissue disorders: Secondary | ICD-10-CM

## 2018-02-26 DIAGNOSIS — Z794 Long term (current) use of insulin: Secondary | ICD-10-CM | POA: Diagnosis not present

## 2018-02-26 DIAGNOSIS — R079 Chest pain, unspecified: Secondary | ICD-10-CM | POA: Diagnosis present

## 2018-02-26 LAB — BASIC METABOLIC PANEL
Anion gap: 11 (ref 5–15)
BUN: 23 mg/dL — ABNORMAL HIGH (ref 6–20)
CALCIUM: 8.8 mg/dL — AB (ref 8.9–10.3)
CO2: 21 mmol/L — ABNORMAL LOW (ref 22–32)
CREATININE: 1.39 mg/dL — AB (ref 0.44–1.00)
Chloride: 105 mmol/L (ref 98–111)
GFR calc non Af Amer: 41 mL/min — ABNORMAL LOW (ref >60)
GFR, EST AFRICAN AMERICAN: 48 mL/min — AB (ref >60)
Glucose, Bld: 240 mg/dL — ABNORMAL HIGH (ref 70–99)
Potassium: 4.1 mmol/L (ref 3.5–5.1)
SODIUM: 137 mmol/L (ref 135–145)

## 2018-02-26 LAB — I-STAT TROPONIN, ED
TROPONIN I, POC: 0 ng/mL (ref 0.00–0.08)
Troponin i, poc: 0.01 ng/mL (ref 0.00–0.08)

## 2018-02-26 LAB — CBC
HCT: 31.3 % — ABNORMAL LOW (ref 36.0–46.0)
Hemoglobin: 9.4 g/dL — ABNORMAL LOW (ref 12.0–15.0)
MCH: 25.9 pg — ABNORMAL LOW (ref 26.0–34.0)
MCHC: 30 g/dL (ref 30.0–36.0)
MCV: 86.2 fL (ref 78.0–100.0)
PLATELETS: 205 10*3/uL (ref 150–400)
RBC: 3.63 MIL/uL — AB (ref 3.87–5.11)
RDW: 14.1 % (ref 11.5–15.5)
WBC: 4.7 10*3/uL (ref 4.0–10.5)

## 2018-02-26 LAB — CBG MONITORING, ED: Glucose-Capillary: 231 mg/dL — ABNORMAL HIGH (ref 70–99)

## 2018-02-26 LAB — I-STAT BETA HCG BLOOD, ED (MC, WL, AP ONLY)

## 2018-02-26 LAB — BRAIN NATRIURETIC PEPTIDE: B NATRIURETIC PEPTIDE 5: 19.7 pg/mL (ref 0.0–100.0)

## 2018-02-26 MED ORDER — ALBUTEROL SULFATE (2.5 MG/3ML) 0.083% IN NEBU
5.0000 mg | INHALATION_SOLUTION | Freq: Once | RESPIRATORY_TRACT | Status: AC
  Administered 2018-02-26: 5 mg via RESPIRATORY_TRACT
  Filled 2018-02-26: qty 6

## 2018-02-26 MED ORDER — IPRATROPIUM BROMIDE 0.02 % IN SOLN
0.5000 mg | Freq: Once | RESPIRATORY_TRACT | Status: AC
  Administered 2018-02-26: 0.5 mg via RESPIRATORY_TRACT
  Filled 2018-02-26: qty 2.5

## 2018-02-26 NOTE — ED Provider Notes (Signed)
[facility] EMERGENCY DEPARTMENT Provider Note   CSN: [id number] Arrival date & time: 02/26/18  1930     History   Chief Complaint Chief Complaint  Patient presents with  . Chest Pain  . Shortness of Breath  . Leg Swelling    HPI [name] [name] is a 57 y.o. female.  The history is provided by the patient and medical records. No language interpreter was used.  Chest Pain   Associated symptoms include shortness of breath.  Shortness of Breath  Associated symptoms include chest pain.    57 year old female with history of diabetes, hypertension, hepatitis C, anemia, substance abuse presenting for evaluation of chest pain shortness of breath.  Patient report for the past 10 days she has had persistent shortness of breath.  She described it as a tightness sensation in her chest, with wheezing, and increased shortness of breath with exertion.  She also report swelling to both her legs with the same duration.  Pain is to her mid chest rating across the chest.  She denies having fever, productive cough, hemoptysis, abdominal pain or back pain.  She denies tobacco abuse.  She denies history of COPD or asthma.  No prior history of congestive heart failure.  No specific treatment tried at home.  Denies any prior cardiac disease.   Past Medical History:  Diagnosis Date  . Anemia   . Chronic hepatitis C without hepatic coma ([place]) 11/09/2016  . Diabetes mellitus   . Fibroids   . 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 Hypertension   . MRSA (methicillin resistant Staphylococcus aureus)   . Trichomonas   . VAGINITIS, BACTERIAL, RECURRENT 08/15/2007   Qualifier: Diagnosis of  By: Radene Ou MD, [place]      Patient Active Problem List   Diagnosis Date Noted  . Diabetic neuropathy ([place]) 06/16/2017  . Non compliance w medication regimen 04/12/2017  . Diarrhea 11/17/2016  . Family history of colon cancer in mother 11/17/2016  . Dyspareunia in  female 11/11/2016  . Screen for colon cancer 11/11/2016  . History of ovarian cyst 11/11/2016  . Chronic hepatitis C without hepatic coma ([place]) 11/09/2016  . Hyperlipidemia 10/07/2016  . Type 2 diabetes mellitus ([place]) 01/29/2014  . BREAST PAIN, BILATERAL 02/19/2010  . ANEMIA, IRON DEFICIENCY 10/03/2009  . LEUKOPENIA, MILD 09/19/2009  . CHEST PAIN UNSPECIFIED 09/19/2009  . LIPOMA 06/18/2009  . 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 TINEA PEDIS 05/07/2009  . SKIN LESION 05/07/2009  . COUGH 05/07/2009  . URI 04/04/2007  . SUBSTANCE ABUSE, MULTIPLE 02/22/2007  . Essential hypertension 02/22/2007    Past Surgical History:  Procedure Laterality Date  . CESAREAN SECTION     breech     OB History    Gravida  1   Para  1   Term  1   Preterm      AB      Living  1     SAB      TAB      Ectopic      Multiple      Live Births  1            Home Medications    Prior to Admission medications   Medication Sig Start Date End Date Taking? Authorizing Provider  ACCU-CHEK SOFTCLIX LANCETS lancets Use to test blood sugar 3 times daily as instructed. Dx code: 250.50 10/07/16   [name], MD  acetaminophen-codeine (TYLENOL #3)  300-30 MG tablet Take 1 tablet by mouth every 12 (twelve) hours as needed for moderate pain. 01/27/18   [name], MD  albuterol (PROVENTIL HFA;VENTOLIN HFA) 108 (90 Base) MCG/ACT inhaler Inhale 2 puffs into the lungs every 6 (six) hours as needed for wheezing or shortness of breath. Patient not taking: Reported on 01/27/2018 04/12/17   [name], MD  amLODipine (NORVASC) 10 MG tablet Take 1 tablet (10 mg total) by mouth daily. 01/05/18   Argentina Donovan, PA-C  atorvastatin (LIPITOR) 20 MG tablet Take 1 tablet (20 mg total) by mouth daily. 01/05/18   Argentina Donovan, PA-C  benzonatate (TESSALON) 100 MG capsule TAKE 1 CAPSULE BY MOUTH TWICE DAILY AS NEEDED FOR COUGH 02/21/18   [name], MD  Blood Glucose Monitoring Suppl  (ACCU-CHEK AVIVA) device Use as instructed daily. 10/07/16   [name], MD  cetirizine (ZYRTEC) 10 MG tablet Take 1 tablet (10 mg total) by mouth daily. 02/07/18   Bast, Tressia Miners A, NP  diphenoxylate-atropine (LOMOTIL) 2.5-0.025 MG tablet TAKE 1 TABLET BY MOUTH THREE TIMES DAILY WITH MEALS 02/21/18   [name], MD  estradiol (ESTRING) 2 MG vaginal ring Place 2 mg vaginally every 3 (three) months. follow package directions Patient not taking: Reported on 09/22/2017 03/18/17   Aletha Halim, MD  famotidine (PEPCID) 20 MG tablet Take 1 tablet (20 mg total) by mouth 2 (two) times daily. Patient not taking: Reported on 01/27/2018 01/09/18   Isla Pence, MD  glucose blood (ACCU-CHEK AVIVA PLUS) test strip Use to test blood sugar 3 times daily as instructed. Dx code: E11.319 10/07/16   [name], MD  HYDROcodone-acetaminophen (NORCO/VICODIN) 5-325 MG tablet Take 1 tablet by mouth every 4 (four) hours as needed. Patient not taking: Reported on 01/27/2018 01/09/18   Isla Pence, MD  Insulin Glargine (LANTUS SOLOSTAR) 100 UNIT/ML Solostar Pen Inject 60 Units into the skin at bedtime. 01/27/18   [name], MD  Insulin Pen Needle (BD PEN NEEDLE NANO U/F) 32G X 4 MM MISC Use to inject insulin 4 times daily. 01/05/18   Argentina Donovan, PA-C  liraglutide (VICTOZA) 18 MG/3ML SOPN Inject 0.3 mLs (1.8 mg total) into the skin daily with breakfast. Start with 0.29mL for 1 week then 0.2 mL for 1 week then 0.3 mL thereafter. 01/05/18   Argentina Donovan, PA-C  lisinopril-hydrochlorothiazide (ZESTORETIC) 20-12.5 MG tablet Take 2 tablets by mouth daily. 01/05/18   Argentina Donovan, PA-C  nitroGLYCERIN (NITROSTAT) 0.4 MG SL tablet Place 0.4 mg under the tongue every 5 (five) minutes as needed for chest pain.    [provider]  ondansetron (ZOFRAN ODT) 8 MG disintegrating tablet 8mg  ODT q4 hours prn nausea Patient not taking: Reported on 01/27/2018 09/22/17   Veryl Speak, MD  pregabalin (LYRICA) 75  MG capsule Take 1 capsule (75 mg total) by mouth 2 (two) times daily. 01/27/18   [name], MD  progesterone (PROMETRIUM) 100 MG capsule Take 1 capsule (100 mg total) by mouth daily. Patient not taking: Reported on 02/01/2017 11/22/16   Aletha Halim, MD  promethazine (PHENERGAN) 25 MG suppository Place 1 suppository (25 mg total) rectally every 8 (eight) hours as needed for nausea or vomiting. Patient not taking: Reported on 01/05/2018 09/22/17   [name], MD  triamcinolone cream (KENALOG) 0.1 % Apply 1 application topically 2 (two) times daily. 02/07/18   Orvan July, NP    Family History Family History  Problem Relation Age of Onset  . Colon cancer Mother   .  Other Neg Hx   . Breast cancer Neg Hx     Social History Social History   Tobacco Use  . Smoking status: Never Smoker  . Smokeless tobacco: Never Used  Substance Use Topics  . Alcohol use: No  . Drug use: No    Types: Cocaine, Marijuana    Comment: 8 years ago for the cocaine  1 year ago with marijuana     Allergies   Patient has no known allergies.   Review of Systems Review of Systems  Respiratory: Positive for shortness of breath.   Cardiovascular: Positive for chest pain.  All other systems reviewed and are negative.    Physical Exam Updated Vital Signs BP (!) 170/94   Pulse 92   Temp 97.9 F (36.6 C) (Oral)   Resp 20   Ht 5\' 6"  (1.676 m)   Wt 107 kg   LMP 04/01/2010   SpO2 97%   BMI 38.09 kg/m   Physical Exam  Constitutional: She appears well-developed and well-nourished. No distress.  Obese female appears to be in mild respiratory discomfort but nontoxic.  HENT:  Head: Atraumatic.  Eyes: Conjunctivae are normal.  Neck: Neck supple.  No JVD  Cardiovascular: Normal rate, regular rhythm, intact distal pulses and normal pulses. Exam reveals gallop.  Pulmonary/Chest: She has decreased breath sounds. She has no wheezes. She has no rhonchi. She has rales.  Abdominal: Soft.    Musculoskeletal:       Right lower leg: She exhibits edema.       Left lower leg: She exhibits edema.  Neurological: She is alert.  Skin: No rash noted.  Psychiatric: She has a normal mood and affect.  Nursing note and vitals reviewed.    ED Treatments / Results  Labs (all labs ordered are listed, but only abnormal results are displayed) Labs Reviewed  BASIC METABOLIC PANEL - Abnormal; Notable for the following components:      Result Value   CO2 21 (*)    Glucose, Bld 240 (*)    BUN 23 (*)    Creatinine, Ser 1.39 (*)    Calcium 8.8 (*)    GFR calc non Af Amer 41 (*)    GFR calc Af Amer 48 (*)    All other components within normal limits  CBC - Abnormal; Notable for the following components:   RBC 3.63 (*)    Hemoglobin 9.4 (*)    HCT 31.3 (*)    MCH 25.9 (*)    All other components within normal limits  CBG MONITORING, ED - Abnormal; Notable for the following components:   Glucose-Capillary 231 (*)    All other components within normal limits  BRAIN NATRIURETIC PEPTIDE  I-STAT TROPONIN, ED  I-STAT BETA HCG BLOOD, ED ([facility], [facility], [facility] ONLY)  I-STAT TROPONIN, ED    EKG None  ED ECG REPORT   Date: 02/26/2018  Rate: 94  Rhythm: normal sinus rhythm  QRS Axis: normal  Intervals: normal  ST/T Wave abnormalities: normal  Conduction Disutrbances:none  Narrative Interpretation: LVH  Old EKG Reviewed: unchanged  I have personally reviewed the EKG tracing and agree with the computerized printout as noted.   Radiology Dg Chest 2 View  Result Date: 02/26/2018 CLINICAL DATA:  Shortness of breath EXAM: CHEST - 2 VIEW COMPARISON:  01/09/2018 FINDINGS: The heart size and mediastinal contours are within normal limits. Both lungs are clear. The visualized skeletal structures are unremarkable. IMPRESSION: No active cardiopulmonary disease. Electronically Signed   By: Elta [place]  Lukens M.D.   On: 02/26/2018 20:19    Procedures Procedures (including critical care time)  Medications  Ordered in ED Medications  albuterol (PROVENTIL HFA;VENTOLIN HFA) 108 (90 Base) MCG/ACT inhaler 2 puff (has no administration in time range)  albuterol (PROVENTIL) (2.5 MG/3ML) 0.083% nebulizer solution 5 mg (5 mg Nebulization Given 02/26/18 2159)  ipratropium (ATROVENT) nebulizer solution 0.5 mg (0.5 mg Nebulization Given 02/26/18 2159)     Initial Impression / Assessment and Plan / ED Course  I have reviewed the triage vital signs and the nursing notes.  Pertinent labs & imaging results that were available during my care of the patient were reviewed by me and considered in my medical decision making (see chart for details).     BP 129/71   Pulse 95   Temp 97.9 F (36.6 C) (Oral)   Resp 20   Ht 5\' 6"  (1.676 m)   Wt 107 kg   LMP 04/01/2010   SpO2 96%   BMI 38.09 kg/m    Final Clinical Impressions(s) / ED Diagnoses   Final diagnoses:  Shortness of breath  Leg swelling  Atypical chest pain    ED Discharge Orders    None     9:18 PM Patient complains of shortness of breath for the past 10 days.  She also endorsed some chest tightness as well.  She appears to be fluid overloaded on exam.  She has bilateral peripheral edema with crackles at her lung base but no JVD.  No known history of CHF.  Will check BNP, work-up initiated.  11:02 PM Normal BNP, pregnancy test is negative, initial troponin is negative, will obtain delta troponin.  Mild AKI with BUN to 23, creatinine 1.39.  Hyperglycemia with a CBG of 240, normal anion gap.  Mild anemia with hemoglobin of 9.4 not far off from baseline.  Chest x-ray unremarkable.  EKG without concerning feature.  12:08 AM Normal delta trop. Pt report improvement with duonebs.  She does have a pcp that she can f/u closely for further evaluation and management of her condition.  Would benefit from outpt cardiac stress test as well.  HEART score of 3.  Return precaution discussed.     Domenic Moras, PA-C 02/27/18 0013    Maudie Flakes,  MD 02/27/18 [phone]

## 2018-02-26 NOTE — ED Notes (Signed)
ED Provider at bedside. 

## 2018-02-26 NOTE — ED Triage Notes (Signed)
Pt reports SOB upon exertion, centralized chest pain and swollen legs X1 week.  No hx of cardiac issues.

## 2018-02-26 NOTE — ED Notes (Signed)
Contacted main lab to add on blood work.

## 2018-02-27 MED ORDER — ALBUTEROL SULFATE HFA 108 (90 BASE) MCG/ACT IN AERS
2.0000 | INHALATION_SPRAY | RESPIRATORY_TRACT | Status: DC | PRN
  Administered 2018-02-27: 2 via RESPIRATORY_TRACT
  Filled 2018-02-27: qty 6.7

## 2018-02-27 NOTE — ED Notes (Signed)
Pt verbalizes understanding of d/c instructions. Pt ambulatory at d/c with all belongings.   

## 2018-02-27 NOTE — Discharge Instructions (Addendum)
Please call and follow up closely with your doctor next week for further evaluation of your condition.  You may benefit from an outpatient cardiac stress test.  Your doctor will also need to address your leg swelling.  Use albuterol inhaler 2 puffs every 4 hours as needed for shortness of breath.  Return if you have any concerns.

## 2018-03-15 ENCOUNTER — Ambulatory Visit: Payer: Medicaid Other | Attending: Family Medicine | Admitting: Physician Assistant

## 2018-03-15 VITALS — BP 159/90 | HR 90 | Temp 98.2°F | Resp 18 | Ht 66.0 in | Wt 241.0 lb

## 2018-03-15 DIAGNOSIS — E11311 Type 2 diabetes mellitus with unspecified diabetic retinopathy with macular edema: Secondary | ICD-10-CM

## 2018-03-15 DIAGNOSIS — Z09 Encounter for follow-up examination after completed treatment for conditions other than malignant neoplasm: Secondary | ICD-10-CM

## 2018-03-15 DIAGNOSIS — Z794 Long term (current) use of insulin: Secondary | ICD-10-CM

## 2018-03-15 DIAGNOSIS — I10 Essential (primary) hypertension: Secondary | ICD-10-CM | POA: Diagnosis not present

## 2018-03-15 DIAGNOSIS — Z79899 Other long term (current) drug therapy: Secondary | ICD-10-CM | POA: Diagnosis not present

## 2018-03-15 DIAGNOSIS — E1169 Type 2 diabetes mellitus with other specified complication: Secondary | ICD-10-CM | POA: Diagnosis not present

## 2018-03-15 DIAGNOSIS — E11319 Type 2 diabetes mellitus with unspecified diabetic retinopathy without macular edema: Secondary | ICD-10-CM | POA: Diagnosis not present

## 2018-03-15 DIAGNOSIS — B182 Chronic viral hepatitis C: Secondary | ICD-10-CM | POA: Insufficient documentation

## 2018-03-15 DIAGNOSIS — R0602 Shortness of breath: Secondary | ICD-10-CM

## 2018-03-15 LAB — GLUCOSE, POCT (MANUAL RESULT ENTRY): POC GLUCOSE: 231 mg/dL — AB (ref 70–99)

## 2018-03-15 MED ORDER — LISINOPRIL-HYDROCHLOROTHIAZIDE 20-25 MG PO TABS: 1.0000 | ORAL_TABLET | Freq: Every day | ORAL | 3 refills | Status: DC

## 2018-03-15 MED ORDER — ALBUTEROL SULFATE HFA 108 (90 BASE) MCG/ACT IN AERS: 2.0000 | INHALATION_SPRAY | Freq: Four times a day (QID) | RESPIRATORY_TRACT | 2 refills | Status: DC | PRN

## 2018-03-15 MED ORDER — PREGABALIN 75 MG PO CAPS: 75.0000 mg | ORAL_CAPSULE | Freq: Two times a day (BID) | ORAL | 3 refills | Status: DC

## 2018-03-15 NOTE — Patient Instructions (Signed)
Check blood sugars fasting and at bedtime and record and bring to next visit 

## 2018-03-15 NOTE — Progress Notes (Signed)
Patient ID: [name] [name], female   DOB: Aug 20, 1960, 57 y.o.   MRN: [id number]    [name] [name], is a 57 y.o. female  ZSW:[id number]  [id number]  DOB - Aug 11, 1960  Subjective:  Chief Complaint and HPI: [name] [name] is a 57 y.o. female here today  for a follow up visit Seen in ED 02/26/2018 for SOB.  SOB has been much better the last few days but she would like another albuterol inhaler.  No fever.  No cough.  No CP.  No orthopnea.    Needs RF of lyrica  Not checking blood sugars regularly.  Says she is compliant with meds and not with diet.    From ED note: Patient complains of shortness of breath for the past 10 days.  She also endorsed some chest tightness as well.  She appears to be fluid overloaded on exam.  She has bilateral peripheral edema with crackles at her lung base but no JVD.  No known history of CHF.  Will check BNP, work-up initiated.  11:02 PM Normal BNP, pregnancy test is negative, initial troponin is negative, will obtain delta troponin.  Mild AKI with BUN to 23, creatinine 1.39.  Hyperglycemia with a CBG of 240, normal anion gap.  Mild anemia with hemoglobin of 9.4 not far off from baseline.  Chest x-ray unremarkable.  EKG without concerning feature.  12:08 AM Normal delta trop. Pt report improvement with duonebs.  She does have a pcp that she can f/u closely for further evaluation and management of her condition.  Would benefit from outpt cardiac stress test as well.  HEART score of 3.  Return precaution discussed.     ED/Hospital notes reviewed and summarized above  ROS:   Constitutional:  No f/c, No night sweats, No unexplained weight loss. EENT:  No vision changes, No blurry vision, No hearing changes. No mouth, throat, or ear problems.  Respiratory: No cough, + SOB, but has improved, still with occasional wheezing Cardiac: No CP, no palpitations GI:  No abd pain, No N/V/D. GU: No Urinary s/sx Musculoskeletal: No joint pain Neuro: No headache, no  dizziness, no motor weakness.  Skin: No rash Endocrine:  No polydipsia. No polyuria.  Psych: Denies SI/HI  No problems updated.  ALLERGIES: No Known Allergies  PAST MEDICAL HISTORY: Past Medical History:  Diagnosis Date  . Anemia   . Chronic hepatitis C without hepatic coma ([place]) 11/09/2016  . Diabetes mellitus   . Fibroids   . 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 Hypertension   . MRSA (methicillin resistant Staphylococcus aureus)   . Trichomonas   . VAGINITIS, BACTERIAL, RECURRENT 08/15/2007   Qualifier: Diagnosis of  By: Radene Ou MD, [place]: Prior to Admission medications   Medication Sig Start Date End Date Taking? Authorizing Provider  acetaminophen-codeine (TYLENOL #3) 300-30 MG tablet Take 1 tablet by mouth every 12 (twelve) hours as needed for moderate pain. 01/27/18   [name], MD  albuterol (PROVENTIL HFA;VENTOLIN HFA) 108 (90 Base) MCG/ACT inhaler Inhale 2 puffs into the lungs every 6 (six) hours as needed for wheezing or shortness of breath. 03/15/18   Argentina Donovan, PA-C  amLODipine (NORVASC) 10 MG tablet Take 1 tablet (10 mg total) by mouth daily. 01/05/18   Argentina Donovan, PA-C  benzonatate (TESSALON) 100 MG capsule TAKE 1 CAPSULE BY MOUTH TWICE DAILY AS NEEDED FOR COUGH 02/21/18   [name], MD  diphenoxylate-atropine (LOMOTIL) 2.5-0.025 MG tablet TAKE 1 TABLET BY MOUTH THREE TIMES DAILY WITH MEALS 02/21/18   [name], MD  Insulin Glargine (LANTUS SOLOSTAR) 100 UNIT/ML Solostar Pen Inject 60 Units into the skin at bedtime. 01/27/18   [name], MD  liraglutide (VICTOZA) 18 MG/3ML SOPN Inject 0.3 mLs (1.8 mg total) into the skin daily with breakfast. Start with 0.21mL for 1 week then 0.2 mL for 1 week then 0.3 mL thereafter. 01/05/18   Argentina Donovan, PA-C  lisinopril-hydrochlorothiazide (PRINZIDE,ZESTORETIC) 20-25 MG tablet Take 1 tablet by mouth daily. 03/15/18   Argentina Donovan,  PA-C  pregabalin (LYRICA) 75 MG capsule Take 1 capsule (75 mg total) by mouth 2 (two) times daily. 03/15/18   Argentina Donovan, PA-C  progesterone (PROMETRIUM) 100 MG capsule Take 1 capsule (100 mg total) by mouth daily. Patient not taking: Reported on 02/01/2017 11/22/16   Aletha Halim, MD  triamcinolone cream (KENALOG) 0.1 % Apply 1 application topically 2 (two) times daily. Patient taking differently: Apply 1 application topically 2 (two) times daily as needed (Foot pain).  02/07/18   Loura Halt A, NP     Objective:  EXAM:   Vitals:   03/15/18 1340  BP: (!) 159/90  Pulse: 90  Resp: 18  Temp: 98.2 F (36.8 C)  TempSrc: Oral  SpO2: 98%  Weight: 241 lb (109.3 kg)  Height: 5\' 6"  (1.676 m)    General appearance : A&OX3. NAD. Non-toxic-appearing HEENT: Atraumatic and Normocephalic.  PERRLA. EOM intact.  TM clear B. Mouth-MMM, post pharynx WNL w/o erythema, No PND. Neck: supple, no JVD. No cervical lymphadenopathy. No thyromegaly Chest/Lungs:  Breathing-non-labored, Good air entry bilaterally, breath sounds normal without rales, rhonchi, or wheezing  CVS: S1 S2 regular, no murmurs, gallops, rubs  Extremities: Bilateral Lower Ext shows mild edema, both legs are warm to touch with = pulse throughout Neurology:  CN II-XII grossly intact, Non focal.   Psych:  TP linear. J/I WNL. Normal speech. Appropriate eye contact and affect.  Skin:  No Rash  Data Review Lab Results  Component Value Date   HGBA1C 9.8 (A) 01/05/2018   HGBA1C 12.3 08/16/2017   HGBA1C 14.7 04/12/2017     Assessment & Plan   1. Encounter for examination following treatment at hospital Much improved  2. 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  Check blood sugars fasting and at bedtimes and record and bring to next visit - Glucose (CBG) - Ambulatory referral to  Cardiology  3. Essential hypertension Uncontrolled-will increase HCTZ component of meds - lisinopril-hydrochlorothiazide (PRINZIDE,ZESTORETIC) 20-25 MG tablet; Take 1 tablet by mouth daily.  Dispense: 90 tablet; Refill: 3 - Ambulatory referral to Cardiology  4. Type 2 diabetes mellitus with other specified complication, with long-term current use of insulin (HCC) - pregabalin (LYRICA) 75 MG capsule; Take 1 capsule (75 mg total) by mouth 2 (two) times daily.  Dispense: 60 capsule; Refill: 3  5. SOB (shortness of breath) No wheezing currently but she just used albuterol prior to OV.  Per ED note-recommended cardiology/stress test f/up/concern for early CHF.  No signs failure today.   - Ambulatory referral to Cardiology   Patient have been counseled extensively about nutrition and exercise  Return in about 1 month (around 04/15/2018) for Dr Margarita Rana for BP and DM/bloodwork.  The patient was given clear instructions to go to ER or return to medical center if  symptoms don't improve, worsen or new problems develop. The patient verbalized understanding. The patient was told to call to get lab results if they haven't heard anything in the next week.     Freeman Caldron, PA-C [facility], [place]   03/15/2018, 1:52 PM

## 2018-04-06 DIAGNOSIS — Z794 Long term (current) use of insulin: Secondary | ICD-10-CM | POA: Diagnosis not present

## 2018-04-06 DIAGNOSIS — E113511 Type 2 diabetes mellitus with proliferative diabetic retinopathy with macular edema, right eye: Secondary | ICD-10-CM | POA: Diagnosis not present

## 2018-04-06 DIAGNOSIS — E113412 Type 2 diabetes mellitus with severe nonproliferative diabetic retinopathy with macular edema, left eye: Secondary | ICD-10-CM | POA: Diagnosis not present

## 2018-04-10 ENCOUNTER — Ambulatory Visit: Payer: Medicaid Other | Admitting: Family Medicine

## 2018-04-21 ENCOUNTER — Telehealth: Payer: Self-pay | Admitting: Family Medicine

## 2018-04-21 NOTE — Telephone Encounter (Signed)
Patients insurance will not pay for the Lyrica and she is wanting to know if she can be prescribed a substitute prescription. Please follow up with patient.

## 2018-04-24 NOTE — Telephone Encounter (Signed)
Will route to PCP for review. 

## 2018-04-24 NOTE — Telephone Encounter (Signed)
Can we please obtain a prior authorization for this patient for Lyrica as she failed gabapentin last year? Please inform the patient we are working on a prior authorization, thank you.

## 2018-04-25 MED ORDER — DULOXETINE HCL 60 MG PO CPEP: 60.0000 mg | ORAL_CAPSULE | Freq: Every day | ORAL | 3 refills | Status: DC

## 2018-04-25 NOTE — Telephone Encounter (Signed)
I have sent a prescription for Cymbalta to her pharmacy.  Please inform the patient.

## 2018-04-25 NOTE — Telephone Encounter (Signed)
In order to get a PA approved from medicaid she would need to try and fail duloxetine or have a reason why she couldn't try it.

## 2018-04-26 NOTE — Telephone Encounter (Signed)
Patient was called and informed of medication change  

## 2018-06-12 ENCOUNTER — Ambulatory Visit: Payer: Medicaid Other | Admitting: Family Medicine

## 2018-06-23 ENCOUNTER — Ambulatory Visit: Payer: Medicaid Other | Attending: Family Medicine | Admitting: Family Medicine

## 2018-06-23 ENCOUNTER — Encounter: Payer: Self-pay | Admitting: Family Medicine

## 2018-06-23 VITALS — BP 153/91 | HR 83 | Temp 98.1°F | Ht 66.0 in | Wt 240.4 lb

## 2018-06-23 DIAGNOSIS — E11311 Type 2 diabetes mellitus with unspecified diabetic retinopathy with macular edema: Secondary | ICD-10-CM | POA: Diagnosis not present

## 2018-06-23 DIAGNOSIS — R197 Diarrhea, unspecified: Secondary | ICD-10-CM | POA: Diagnosis not present

## 2018-06-23 DIAGNOSIS — R05 Cough: Secondary | ICD-10-CM

## 2018-06-23 DIAGNOSIS — N39498 Other specified urinary incontinence: Secondary | ICD-10-CM | POA: Diagnosis not present

## 2018-06-23 DIAGNOSIS — E1140 Type 2 diabetes mellitus with diabetic neuropathy, unspecified: Secondary | ICD-10-CM | POA: Diagnosis not present

## 2018-06-23 DIAGNOSIS — Z79899 Other long term (current) drug therapy: Secondary | ICD-10-CM | POA: Diagnosis not present

## 2018-06-23 DIAGNOSIS — R059 Cough, unspecified: Secondary | ICD-10-CM

## 2018-06-23 DIAGNOSIS — I10 Essential (primary) hypertension: Secondary | ICD-10-CM

## 2018-06-23 DIAGNOSIS — Z09 Encounter for follow-up examination after completed treatment for conditions other than malignant neoplasm: Secondary | ICD-10-CM | POA: Insufficient documentation

## 2018-06-23 DIAGNOSIS — Z794 Long term (current) use of insulin: Secondary | ICD-10-CM | POA: Diagnosis not present

## 2018-06-23 DIAGNOSIS — E1149 Type 2 diabetes mellitus with other diabetic neurological complication: Secondary | ICD-10-CM

## 2018-06-23 DIAGNOSIS — N941 Unspecified dyspareunia: Secondary | ICD-10-CM | POA: Insufficient documentation

## 2018-06-23 LAB — GLUCOSE, POCT (MANUAL RESULT ENTRY): POC GLUCOSE: 188 mg/dL — AB (ref 70–99)

## 2018-06-23 LAB — POCT GLYCOSYLATED HEMOGLOBIN (HGB A1C): Hemoglobin A1C: 9.2 % — AB (ref 4.0–5.6)

## 2018-06-23 MED ORDER — LIRAGLUTIDE 18 MG/3ML ~~LOC~~ SOPN: 1.8000 mg | PEN_INJECTOR | Freq: Every day | SUBCUTANEOUS | 3 refills | Status: DC

## 2018-06-23 MED ORDER — PREGABALIN 75 MG PO CAPS: 75.0000 mg | ORAL_CAPSULE | Freq: Two times a day (BID) | ORAL | 3 refills | Status: DC

## 2018-06-23 MED ORDER — ATORVASTATIN CALCIUM 20 MG PO TABS: 20.0000 mg | ORAL_TABLET | Freq: Every day | ORAL | 3 refills | Status: DC

## 2018-06-23 MED ORDER — LISINOPRIL-HYDROCHLOROTHIAZIDE 20-25 MG PO TABS: 1.0000 | ORAL_TABLET | Freq: Every day | ORAL | 3 refills | Status: DC

## 2018-06-23 MED ORDER — INSULIN GLARGINE 100 UNIT/ML SOLOSTAR PEN: 60.0000 [IU] | PEN_INJECTOR | Freq: Every day | SUBCUTANEOUS | 5 refills | Status: DC

## 2018-06-23 MED ORDER — DIPHENOXYLATE-ATROPINE 2.5-0.025 MG PO TABS: 1.0000 | ORAL_TABLET | Freq: Three times a day (TID) | ORAL | 1 refills | Status: DC

## 2018-06-23 MED ORDER — OXYBUTYNIN CHLORIDE 5 MG PO TABS: 5.0000 mg | ORAL_TABLET | Freq: Three times a day (TID) | ORAL | 3 refills | Status: DC

## 2018-06-23 MED ORDER — AMLODIPINE BESYLATE 10 MG PO TABS: 10.0000 mg | ORAL_TABLET | Freq: Every day | ORAL | 5 refills | Status: DC

## 2018-06-23 NOTE — Progress Notes (Signed)
Subjective:  Patient ID: [name] [name], female    DOB: 06/03/1961  Age: 57 y.o. MRN: [id number]  CC: Diabetes   HPI [name] [name] is a 57 year old female with a history of type 2 diabetes mellitus (A1c 9.2), diabetic neuropathy hypertension, diabetic retinopathy, hyperlipidemia, hepatitis C (completed treatment with harvoni) who presents today for  a follow up visit. She has remained on the titration dose of Victoza and never went up to the maximum dose but would resttart again after completing the 1.2mg  for one week. She has been non compliant with exercise. She complains of dyspareunia which she describes as needles during intercourse and lubricants have been ineffective. Prescribed vaginal estrogen by Concha Norway which she never picked up. She has also had accidents in her sleep and wakes up with the bed wet so now she wears a depends. Endorses stress incontinence.Symptoms have been present for the last couple of months. She has intermittent diarrhea for which she has to use Lomotil. Denies abdominal pain. She was on Cymbalta and Lyrica for diabetic neuropathy but discontinued the former because it made her sick.  Past Medical History:  Diagnosis Date  . Anemia   . Chronic hepatitis C without hepatic coma ([place]) 11/09/2016  . Diabetes mellitus   . Fibroids   . 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 Hypertension   . MRSA (methicillin resistant Staphylococcus aureus)   . Trichomonas   . VAGINITIS, BACTERIAL, RECURRENT 08/15/2007   Qualifier: Diagnosis of  By: Radene Ou MD, [place]      Past Surgical History:  Procedure Laterality Date  . CESAREAN SECTION     breech    No Known Allergies   Outpatient Medications Prior to Visit  Medication Sig Dispense Refill  . albuterol (PROVENTIL HFA;VENTOLIN HFA) 108 (90 Base) MCG/ACT inhaler Inhale 2 puffs into the lungs every 6 (six) hours as needed for wheezing or shortness of breath. 1 Inhaler 2  . triamcinolone  cream (KENALOG) 0.1 % Apply 1 application topically 2 (two) times daily. (Patient taking differently: Apply 1 application topically 2 (two) times daily as needed (Foot pain). ) 30 g 0  . amLODipine (NORVASC) 10 MG tablet Take 1 tablet (10 mg total) by mouth daily. 30 tablet 5  . diphenoxylate-atropine (LOMOTIL) 2.5-0.025 MG tablet TAKE 1 TABLET BY MOUTH THREE TIMES DAILY WITH MEALS 30 tablet 0  . DULoxetine (CYMBALTA) 60 MG capsule Take 1 capsule (60 mg total) by mouth daily. 30 capsule 3  . Insulin Glargine (LANTUS SOLOSTAR) 100 UNIT/ML Solostar Pen Inject 60 Units into the skin at bedtime. 5 pen 5  . liraglutide (VICTOZA) 18 MG/3ML SOPN Inject 0.3 mLs (1.8 mg total) into the skin daily with breakfast. Start with 0.27mL for 1 week then 0.2 mL for 1 week then 0.3 mL thereafter. 30 mL 3  . lisinopril-hydrochlorothiazide (PRINZIDE,ZESTORETIC) 20-25 MG tablet Take 1 tablet by mouth daily. 90 tablet 3  . acetaminophen-codeine (TYLENOL #3) 300-30 MG tablet Take 1 tablet by mouth every 12 (twelve) hours as needed for moderate pain. (Patient not taking: Reported on 06/23/2018) 30 tablet 0  . benzonatate (TESSALON) 100 MG capsule TAKE 1 CAPSULE BY MOUTH TWICE DAILY AS NEEDED FOR COUGH (Patient not taking: Reported on 06/23/2018) 20 capsule 0  . progesterone (PROMETRIUM) 100 MG capsule Take 1 capsule (100 mg total) by mouth daily. (Patient not taking: Reported on 02/01/2017) 30 capsule 3  . pregabalin (LYRICA) 75 MG capsule Take 1 capsule (75 mg  total) by mouth 2 (two) times daily. (Patient not taking: Reported on 06/23/2018) 60 capsule 3   No facility-administered medications prior to visit.     ROS Review of Systems  Constitutional: Negative for activity change, appetite change and fatigue.  HENT: Negative for congestion, sinus pressure and sore throat.   Eyes: Negative for visual disturbance.  Respiratory: Negative for cough, chest tightness, shortness of breath and wheezing.   Cardiovascular: Negative for  chest pain and palpitations.  Gastrointestinal: Positive for diarrhea. Negative for abdominal distention, abdominal pain and constipation.  Endocrine: Negative for polydipsia.  Genitourinary: Negative for dysuria and frequency.  Musculoskeletal: Negative for arthralgias and back pain.  Skin: Negative for rash.  Neurological: Negative for tremors, light-headedness and numbness.  Hematological: Does not bruise/bleed easily.  Psychiatric/Behavioral: Negative for agitation and behavioral problems.    Objective:  BP (!) 153/91   Pulse 83   Temp 98.1 F (36.7 C) (Oral)   Ht 5\' 6"  (1.676 m)   Wt 240 lb 6.4 oz (109 kg)   LMP 04/01/2010   SpO2 96%   BMI 38.80 kg/m   BP/Weight 06/23/2018 03/15/2018 2/40/9735  Systolic BP 329 924 268  Diastolic BP 91 90 88  Wt. (Lbs) 240.4 241 -  BMI 38.8 38.9 -      Physical Exam  Constitutional: She is oriented to person, place, and time. She appears well-developed and well-nourished.  Cardiovascular: Normal rate, normal heart sounds and intact distal pulses.  No murmur heard. Pulmonary/Chest: Effort normal and breath sounds normal. She has no wheezes. She has no rales. She exhibits no tenderness.  Abdominal: Soft. Bowel sounds are normal. She exhibits no distension and no mass. There is no tenderness.  Musculoskeletal: Normal range of motion.  Neurological: She is alert and oriented to person, place, and time.  Skin: Skin is warm and dry.  Psychiatric: She has a normal mood and affect.    Lab Results  Component Value Date   HGBA1C 9.2 (A) 06/23/2018    Assessment & Plan:   1. 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  Dispense: 30 mL; Refill: 3 - Insulin Glargine (LANTUS SOLOSTAR) 100 UNIT/ML Solostar Pen; Inject 60 Units into the skin at bedtime.  Dispense: 5 pen; Refill: 5  2. Type 2 diabetes mellitus with other neurologic complication, with long-term current use of insulin (HCC) See #1 above - Insulin Glargine (LANTUS SOLOSTAR) 100 UNIT/ML Solostar Pen; Inject 60 Units into the skin at bedtime.  Dispense: 5 pen; Refill: 5 - pregabalin (LYRICA) 75 MG capsule; Take 1 capsule (75 mg total) by mouth 2 (two) times daily.  Dispense: 60 capsule; Refill: 3 - atorvastatin (LIPITOR) 20 MG tablet; Take 1 tablet (20 mg total) by mouth daily.  Dispense: 30 tablet; Refill: 3  3. Diarrhea, unspecified type Might benefit from GI referral if symptoms  Do not improve - diphenoxylate-atropine (LOMOTIL) 2.5-0.025 MG tablet; Take 1 tablet by mouth 3 (three) times daily with meals.  Dispense: 30 tablet; Refill: 1    4. Essential hypertension Uncontrolled - yet to take medications Compliance emphasized -  amLODipine (NORVASC) 10 MG tablet; Take 1 tablet (10 mg total) by mouth daily.  Dispense: 30 tablet; Refill: 5 - lisinopril-hydrochlorothiazide (PRINZIDE,ZESTORETIC) 20-25 MG tablet; Take 1 tablet by mouth daily.  Dispense: 90 tablet; Refill: 3  5. Dyspareunia, female Lubricants have been ineffective She was prescribed vaginal estrogen by Gyn which she never picked up - Ambulatory referral to Gynecology  6. Other urinary incontinence Discussed kegel exercises - oxybutynin (DITROPAN) 5 MG tablet; Take 1 tablet (5 mg total) by mouth 3 (three) times daily.  Dispense: 90 tablet; Refill: 3   Meds ordered this encounter  Medications  . oxybutynin (DITROPAN) 5 MG tablet    Sig: Take 1 tablet (5 mg total) by mouth 3 (three) times daily.    Dispense:  90 tablet    Refill:  3  .  liraglutide (VICTOZA) 18 MG/3ML SOPN    Sig: Inject 0.3 mLs (1.8 mg total) into the skin daily with breakfast.    Dispense:  30 mL    Refill:  3  . Insulin Glargine (LANTUS SOLOSTAR) 100 UNIT/ML Solostar Pen    Sig: Inject 60 Units into the skin at bedtime.    Dispense:  5 pen    Refill:  5    Discontinue previous dose  . diphenoxylate-atropine (LOMOTIL) 2.5-0.025 MG tablet    Sig: Take 1 tablet by mouth 3 (three) times daily with meals.    Dispense:  30 tablet    Refill:  1  . amLODipine (NORVASC) 10 MG tablet    Sig: Take 1 tablet (10 mg total) by mouth daily.    Dispense:  30 tablet    Refill:  5  . lisinopril-hydrochlorothiazide (PRINZIDE,ZESTORETIC) 20-25 MG tablet    Sig: Take 1 tablet by mouth daily.    Dispense:  90 tablet    Refill:  3  . pregabalin (LYRICA) 75 MG capsule    Sig: Take 1 capsule (75 mg total) by mouth 2 (two) times daily.    Dispense:  60 capsule    Refill:  3  . atorvastatin (LIPITOR) 20 MG tablet    Sig: Take 1 tablet (20 mg total) by mouth daily.    Dispense:  30 tablet    Refill:  3    Follow-up: Return in about 3 months (around 09/22/2018) for Follow-up of chronic medical conditions.   [name] MD

## 2018-06-29 DIAGNOSIS — Z794 Long term (current) use of insulin: Secondary | ICD-10-CM | POA: Diagnosis not present

## 2018-06-29 DIAGNOSIS — E113412 Type 2 diabetes mellitus with severe nonproliferative diabetic retinopathy with macular edema, left eye: Secondary | ICD-10-CM | POA: Diagnosis not present

## 2018-06-29 DIAGNOSIS — E113511 Type 2 diabetes mellitus with proliferative diabetic retinopathy with macular edema, right eye: Secondary | ICD-10-CM | POA: Diagnosis not present

## 2018-07-10 ENCOUNTER — Telehealth: Payer: Self-pay | Admitting: Family Medicine

## 2018-07-10 NOTE — Telephone Encounter (Signed)
1) Medication(s) Requested (by name): lyrica  *Patient says that her insurance won't cover it and would like to know if she can be given a substitution. 2) Pharmacy of Choice: [facility] on [address]

## 2018-07-10 NOTE — Telephone Encounter (Signed)
Notified the pharmacy who confirmed a paid claim, they will not have it in stock until Tuesday 12/24 or Thurs 12/26 depending on if their vendor will deliver tomorrow. Left VM for patient.

## 2018-07-10 NOTE — Telephone Encounter (Signed)
PA was approved 07/10/18-07/10/19. Will notify patient and pharmacy shortly

## 2018-07-27 DIAGNOSIS — Z794 Long term (current) use of insulin: Secondary | ICD-10-CM | POA: Diagnosis not present

## 2018-07-27 DIAGNOSIS — E113513 Type 2 diabetes mellitus with proliferative diabetic retinopathy with macular edema, bilateral: Secondary | ICD-10-CM | POA: Diagnosis not present

## 2018-08-03 ENCOUNTER — Ambulatory Visit (INDEPENDENT_AMBULATORY_CARE_PROVIDER_SITE_OTHER): Payer: Medicaid Other | Admitting: Obstetrics and Gynecology

## 2018-08-03 ENCOUNTER — Encounter: Payer: Self-pay | Admitting: Obstetrics and Gynecology

## 2018-08-03 VITALS — BP 131/77 | HR 88 | Ht 64.0 in | Wt 237.2 lb

## 2018-08-03 DIAGNOSIS — N941 Unspecified dyspareunia: Secondary | ICD-10-CM

## 2018-08-03 DIAGNOSIS — N3020 Other chronic cystitis without hematuria: Secondary | ICD-10-CM

## 2018-08-03 MED ORDER — NITROFURANTOIN MONOHYD MACRO 100 MG PO CAPS: 100.0000 mg | ORAL_CAPSULE | Freq: Two times a day (BID) | ORAL | 0 refills | Status: DC

## 2018-08-03 NOTE — Progress Notes (Signed)
Pt states has been hurting since March of last year, pain meds do not help.

## 2018-08-03 NOTE — Progress Notes (Signed)
Patient ID: [name] [name], female   DOB: 1961/01/15, 58 y.o.   MRN: [id number] [name] [name] presents for f/u of dyspareunia. Tried Estring without help. Only has pain with intercourse. Denies vaginal dryness or vaginal bleeding.  Medical problems and medications as listed  PE AF VSS Lungs clear Heart RRR Abd soft + BS GU nl EGBUS vagina mucosa moisit and well rugated + Bladder tendernss  A/P Chronic Cystitis        Dyspareunia Suspect pt's problem is truly to cystitis. Will treat with Macrobid and f/u in 6 weeks

## 2018-08-17 ENCOUNTER — Encounter (HOSPITAL_COMMUNITY): Payer: Self-pay

## 2018-08-17 ENCOUNTER — Other Ambulatory Visit: Payer: Self-pay

## 2018-08-17 ENCOUNTER — Inpatient Hospital Stay (HOSPITAL_COMMUNITY)
Admission: EM | Admit: 2018-08-17 | Discharge: 2018-08-19 | DRG: 812 | Disposition: A | Discharge status: Home / Self Care | Payer: Medicaid Other | Attending: Internal Medicine | Admitting: Internal Medicine

## 2018-08-17 ENCOUNTER — Emergency Department (HOSPITAL_COMMUNITY): Payer: Medicaid Other

## 2018-08-17 DIAGNOSIS — E785 Hyperlipidemia, unspecified: Secondary | ICD-10-CM | POA: Diagnosis present

## 2018-08-17 DIAGNOSIS — R509 Fever, unspecified: Secondary | ICD-10-CM | POA: Diagnosis not present

## 2018-08-17 DIAGNOSIS — N179 Acute kidney failure, unspecified: Secondary | ICD-10-CM | POA: Diagnosis present

## 2018-08-17 DIAGNOSIS — D500 Iron deficiency anemia secondary to blood loss (chronic): Principal | ICD-10-CM | POA: Diagnosis present

## 2018-08-17 DIAGNOSIS — Z79899 Other long term (current) drug therapy: Secondary | ICD-10-CM

## 2018-08-17 DIAGNOSIS — I119 Hypertensive heart disease without heart failure: Secondary | ICD-10-CM | POA: Diagnosis present

## 2018-08-17 DIAGNOSIS — D649 Anemia, unspecified: Secondary | ICD-10-CM | POA: Diagnosis not present

## 2018-08-17 DIAGNOSIS — Z800 Family history of malignant neoplasm of digestive organs: Secondary | ICD-10-CM

## 2018-08-17 DIAGNOSIS — E1165 Type 2 diabetes mellitus with hyperglycemia: Secondary | ICD-10-CM | POA: Diagnosis present

## 2018-08-17 DIAGNOSIS — K529 Noninfective gastroenteritis and colitis, unspecified: Secondary | ICD-10-CM | POA: Diagnosis present

## 2018-08-17 DIAGNOSIS — B182 Chronic viral hepatitis C: Secondary | ICD-10-CM | POA: Diagnosis present

## 2018-08-17 DIAGNOSIS — E1169 Type 2 diabetes mellitus with other specified complication: Secondary | ICD-10-CM

## 2018-08-17 DIAGNOSIS — R0902 Hypoxemia: Secondary | ICD-10-CM | POA: Diagnosis present

## 2018-08-17 DIAGNOSIS — I10 Essential (primary) hypertension: Secondary | ICD-10-CM | POA: Diagnosis present

## 2018-08-17 DIAGNOSIS — E860 Dehydration: Secondary | ICD-10-CM | POA: Diagnosis present

## 2018-08-17 DIAGNOSIS — J101 Influenza due to other identified influenza virus with other respiratory manifestations: Secondary | ICD-10-CM | POA: Diagnosis present

## 2018-08-17 DIAGNOSIS — E119 Type 2 diabetes mellitus without complications: Secondary | ICD-10-CM

## 2018-08-17 DIAGNOSIS — E11311 Type 2 diabetes mellitus with unspecified diabetic retinopathy with macular edema: Secondary | ICD-10-CM

## 2018-08-17 DIAGNOSIS — R05 Cough: Secondary | ICD-10-CM | POA: Diagnosis not present

## 2018-08-17 DIAGNOSIS — Z794 Long term (current) use of insulin: Secondary | ICD-10-CM

## 2018-08-17 LAB — COMPREHENSIVE METABOLIC PANEL
ALT: 13 U/L (ref 0–44)
AST: 24 U/L (ref 15–41)
Albumin: 3.2 g/dL — ABNORMAL LOW (ref 3.5–5.0)
Alkaline Phosphatase: 60 U/L (ref 38–126)
Anion gap: 10 (ref 5–15)
BUN: 26 mg/dL — ABNORMAL HIGH (ref 6–20)
CALCIUM: 8 mg/dL — AB (ref 8.9–10.3)
CO2: 18 mmol/L — AB (ref 22–32)
Chloride: 111 mmol/L (ref 98–111)
Creatinine, Ser: 1.95 mg/dL — ABNORMAL HIGH (ref 0.44–1.00)
GFR calc Af Amer: 32 mL/min — ABNORMAL LOW (ref >60)
GFR calc non Af Amer: 28 mL/min — ABNORMAL LOW (ref >60)
Glucose, Bld: 278 mg/dL — ABNORMAL HIGH (ref 70–99)
Potassium: 4.2 mmol/L (ref 3.5–5.1)
Sodium: 139 mmol/L (ref 135–145)
Total Bilirubin: 0.8 mg/dL (ref 0.3–1.2)
Total Protein: 7 g/dL (ref 6.5–8.1)

## 2018-08-17 LAB — FOLATE: Folate: 17.1 ng/mL (ref >5.9)

## 2018-08-17 LAB — CBC WITH DIFFERENTIAL/PLATELET
Abs Immature Granulocytes: 0.05 10*3/uL (ref 0.00–0.07)
Basophils Absolute: 0 10*3/uL (ref 0.0–0.1)
Basophils Relative: 0 %
Eosinophils Absolute: 0 10*3/uL (ref 0.0–0.5)
Eosinophils Relative: 0 %
HCT: 13.4 % — ABNORMAL LOW (ref 36.0–46.0)
Hemoglobin: 4 g/dL — CL (ref 12.0–15.0)
Immature Granulocytes: 1 %
LYMPHS ABS: 0.9 10*3/uL (ref 0.7–4.0)
Lymphocytes Relative: 20 %
MCH: 26 pg (ref 26.0–34.0)
MCHC: 29.9 g/dL — ABNORMAL LOW (ref 30.0–36.0)
MCV: 87 fL (ref 80.0–100.0)
Monocytes Absolute: 0.5 10*3/uL (ref 0.1–1.0)
Monocytes Relative: 11 %
Neutro Abs: 2.9 10*3/uL (ref 1.7–7.7)
Neutrophils Relative %: 68 %
Platelets: 189 10*3/uL (ref 150–400)
RBC: 1.54 MIL/uL — ABNORMAL LOW (ref 3.87–5.11)
RDW: 14.4 % (ref 11.5–15.5)
WBC: 4.3 10*3/uL (ref 4.0–10.5)
nRBC: 0 % (ref 0.0–0.2)

## 2018-08-17 LAB — INFLUENZA PANEL BY PCR (TYPE A & B)
INFLAPCR: POSITIVE — AB
Influenza B By PCR: NEGATIVE

## 2018-08-17 LAB — POC OCCULT BLOOD, ED: Fecal Occult Bld: NEGATIVE

## 2018-08-17 LAB — IRON AND TIBC
Iron: 19 ug/dL — ABNORMAL LOW (ref 28–170)
Saturation Ratios: 7 % — ABNORMAL LOW (ref 10.4–31.8)
TIBC: 287 ug/dL (ref 250–450)
UIBC: 268 ug/dL

## 2018-08-17 LAB — GLUCOSE, CAPILLARY
Glucose-Capillary: 146 mg/dL — ABNORMAL HIGH (ref 70–99)
Glucose-Capillary: 148 mg/dL — ABNORMAL HIGH (ref 70–99)

## 2018-08-17 LAB — VITAMIN B12: Vitamin B-12: 396 pg/mL (ref 180–914)

## 2018-08-17 LAB — ABO/RH: ABO/RH(D): A NEG

## 2018-08-17 LAB — RETICULOCYTES
IMMATURE RETIC FRACT: 7.8 % (ref 2.3–15.9)
RBC.: 2.94 MIL/uL — ABNORMAL LOW (ref 3.87–5.11)
Retic Count, Absolute: 22.1 10*3/uL (ref 19.0–186.0)
Retic Ct Pct: 0.8 % (ref 0.4–3.1)

## 2018-08-17 LAB — PREPARE RBC (CROSSMATCH)

## 2018-08-17 LAB — FERRITIN: Ferritin: 97 ng/mL (ref 11–307)

## 2018-08-17 LAB — CBG MONITORING, ED: Glucose-Capillary: 205 mg/dL — ABNORMAL HIGH (ref 70–99)

## 2018-08-17 LAB — LACTIC ACID, PLASMA: Lactic Acid, Venous: 0.9 mmol/L (ref 0.5–1.9)

## 2018-08-17 MED ORDER — INSULIN ASPART 100 UNIT/ML ~~LOC~~ SOLN
0.0000 [IU] | Freq: Every day | SUBCUTANEOUS | Status: DC
  Administered 2018-08-18: 4 [IU] via SUBCUTANEOUS

## 2018-08-17 MED ORDER — SODIUM CHLORIDE 0.9 % IV SOLN
10.0000 mL/h | Freq: Once | INTRAVENOUS | Status: AC
  Administered 2018-08-17: 10 mL/h via INTRAVENOUS

## 2018-08-17 MED ORDER — TRAMADOL HCL 50 MG PO TABS
50.0000 mg | ORAL_TABLET | Freq: Once | ORAL | Status: AC
  Administered 2018-08-17: 50 mg via ORAL
  Filled 2018-08-17: qty 1

## 2018-08-17 MED ORDER — INSULIN GLARGINE 100 UNIT/ML ~~LOC~~ SOLN
60.0000 [IU] | Freq: Every day | SUBCUTANEOUS | Status: DC
  Administered 2018-08-17: 60 [IU] via SUBCUTANEOUS
  Filled 2018-08-17 (×4): qty 0.6

## 2018-08-17 MED ORDER — ONDANSETRON HCL 4 MG/2ML IJ SOLN: 4.0000 mg | Freq: Four times a day (QID) | INTRAMUSCULAR | Status: DC | PRN

## 2018-08-17 MED ORDER — ONDANSETRON HCL 4 MG PO TABS: 4.0000 mg | ORAL_TABLET | Freq: Four times a day (QID) | ORAL | Status: DC | PRN

## 2018-08-17 MED ORDER — AMLODIPINE BESYLATE 10 MG PO TABS
10.0000 mg | ORAL_TABLET | Freq: Every day | ORAL | Status: DC
  Administered 2018-08-18 – 2018-08-19 (×2): 10 mg via ORAL
  Filled 2018-08-17: qty 1
  Filled 2018-08-17: qty 2

## 2018-08-17 MED ORDER — ACETAMINOPHEN 325 MG PO TABS
650.0000 mg | ORAL_TABLET | Freq: Four times a day (QID) | ORAL | Status: DC | PRN
  Administered 2018-08-18: 650 mg via ORAL
  Filled 2018-08-17: qty 2

## 2018-08-17 MED ORDER — PREGABALIN 75 MG PO CAPS
75.0000 mg | ORAL_CAPSULE | Freq: Two times a day (BID) | ORAL | Status: DC
  Administered 2018-08-17 – 2018-08-19 (×4): 75 mg via ORAL
  Filled 2018-08-17 (×2): qty 1
  Filled 2018-08-17 (×2): qty 3

## 2018-08-17 MED ORDER — ALBUTEROL SULFATE (2.5 MG/3ML) 0.083% IN NEBU
INHALATION_SOLUTION | RESPIRATORY_TRACT | Status: AC
  Filled 2018-08-17: qty 6

## 2018-08-17 MED ORDER — ATORVASTATIN CALCIUM 10 MG PO TABS
20.0000 mg | ORAL_TABLET | Freq: Every day | ORAL | Status: DC
  Administered 2018-08-18 – 2018-08-19 (×2): 20 mg via ORAL
  Filled 2018-08-17 (×2): qty 2

## 2018-08-17 MED ORDER — ACETAMINOPHEN 650 MG RE SUPP: 650.0000 mg | Freq: Four times a day (QID) | RECTAL | Status: DC | PRN

## 2018-08-17 MED ORDER — OSELTAMIVIR PHOSPHATE 30 MG PO CAPS
30.0000 mg | ORAL_CAPSULE | Freq: Two times a day (BID) | ORAL | Status: DC
  Administered 2018-08-17 – 2018-08-19 (×4): 30 mg via ORAL
  Filled 2018-08-17 (×5): qty 1

## 2018-08-17 MED ORDER — SODIUM CHLORIDE 0.9 % IV SOLN
INTRAVENOUS | Status: DC
  Administered 2018-08-17: 21:00:00 via INTRAVENOUS

## 2018-08-17 MED ORDER — INSULIN ASPART 100 UNIT/ML ~~LOC~~ SOLN
0.0000 [IU] | Freq: Three times a day (TID) | SUBCUTANEOUS | Status: DC
  Administered 2018-08-17: 3 [IU] via SUBCUTANEOUS
  Administered 2018-08-18: 4 [IU] via SUBCUTANEOUS
  Administered 2018-08-18: 7 [IU] via SUBCUTANEOUS
  Administered 2018-08-19 (×2): 11 [IU] via SUBCUTANEOUS

## 2018-08-17 MED ORDER — IPRATROPIUM-ALBUTEROL 0.5-2.5 (3) MG/3ML IN SOLN
3.0000 mL | Freq: Once | RESPIRATORY_TRACT | Status: AC
  Administered 2018-08-17: 3 mL via RESPIRATORY_TRACT

## 2018-08-17 MED ORDER — SODIUM CHLORIDE 0.9 % IV BOLUS
500.0000 mL | Freq: Once | INTRAVENOUS | Status: AC
  Administered 2018-08-17: 500 mL via INTRAVENOUS

## 2018-08-17 MED ORDER — ACETAMINOPHEN 500 MG PO TABS
1000.0000 mg | ORAL_TABLET | Freq: Once | ORAL | Status: AC
  Administered 2018-08-17: 1000 mg via ORAL
  Filled 2018-08-17: qty 2

## 2018-08-17 NOTE — ED Triage Notes (Signed)
Pt presents for evaluation of flu like symptoms. Reports has cough with fever x 2 days.

## 2018-08-17 NOTE — ED Provider Notes (Signed)
[facility] EMERGENCY DEPARTMENT Provider Note   CSN: [id number] Arrival date & time: 08/17/18  0831     History   Chief Complaint Chief Complaint  Patient presents with  . Fever    HPI [name] [name] is a 58 y.o. female.  58 year old female with prior medical history as detailed below presents for evaluation of malaise, cough, congestion, diarrhea, and fever.  Patient reports symptoms started yesterday.  She denies associated vomiting or chest pain.  She has not taken anything today for her symptoms.  She denies known sick contacts.  She reports that she took very little p.o. yesterday.  The history is provided by the patient and medical records.  Illness  Location:  Malaise, cough, congestion, diarrhea, fever Severity:  Moderate Onset quality:  Gradual Duration:  1 day Timing:  Constant Progression:  Worsening Chronicity:  New Associated symptoms: congestion, cough, diarrhea and fever   Associated symptoms: no abdominal pain, no chest pain and no vomiting     Past Medical History:  Diagnosis Date  . Anemia   . Chronic hepatitis C without hepatic coma ([place]) 11/09/2016  . Diabetes mellitus   . Fibroids   . 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 Hypertension   . MRSA (methicillin resistant Staphylococcus aureus)   . Trichomonas   . VAGINITIS, BACTERIAL, RECURRENT 08/15/2007   Qualifier: Diagnosis of  By: Radene Ou MD, [place]      Patient Active Problem List   Diagnosis Date Noted  . Chronic cystitis 08/03/2018  . Diabetic neuropathy ([place]) 06/16/2017  . Non compliance w medication regimen 04/12/2017  . Diarrhea 11/17/2016  . Family history of colon cancer in mother 11/17/2016  . Dyspareunia in female 11/11/2016  . Screen for colon cancer 11/11/2016  . History of ovarian cyst 11/11/2016  . Chronic hepatitis C without hepatic coma ([place]) 11/09/2016  . Hyperlipidemia 10/07/2016  . Type 2 diabetes mellitus ([place])  01/29/2014  . BREAST PAIN, BILATERAL 02/19/2010  . ANEMIA, IRON DEFICIENCY 10/03/2009  . LEUKOPENIA, MILD 09/19/2009  . CHEST PAIN UNSPECIFIED 09/19/2009  . LIPOMA 06/18/2009  . 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 TINEA PEDIS 05/07/2009  . SKIN LESION 05/07/2009  . COUGH 05/07/2009  . SUBSTANCE ABUSE, MULTIPLE 02/22/2007  . Essential hypertension 02/22/2007    Past Surgical History:  Procedure Laterality Date  . CESAREAN SECTION     breech     OB History    Gravida  1   Para  1   Term  1   Preterm      AB      Living  1     SAB      TAB      Ectopic      Multiple      Live Births  1            Home Medications    Prior to Admission medications   Medication Sig Start Date End Date Taking? Authorizing Provider  albuterol (PROVENTIL HFA;VENTOLIN HFA) 108 (90 Base) MCG/ACT inhaler Inhale 2 puffs into the lungs every 6 (six) hours as needed for wheezing or shortness of breath. 03/15/18   Thereasa Solo, Dionne Bucy, PA-C  amLODipine (NORVASC) 10 MG tablet Take 1 tablet (10 mg total) by mouth daily. 06/23/18   [name], MD  atorvastatin (LIPITOR) 20 MG tablet Take 1 tablet (20 mg total) by mouth daily. 06/23/18   [name], MD  diphenoxylate-atropine (LOMOTIL) 2.5-0.025  MG tablet Take 1 tablet by mouth 3 (three) times daily with meals. 06/23/18   [name], MD  Insulin Glargine (LANTUS SOLOSTAR) 100 UNIT/ML Solostar Pen Inject 60 Units into the skin at bedtime. 06/23/18   [name], MD  liraglutide (VICTOZA) 18 MG/3ML SOPN Inject 0.3 mLs (1.8 mg total) into the skin daily with breakfast. 06/23/18   [name], MD  lisinopril-hydrochlorothiazide (PRINZIDE,ZESTORETIC) 20-25 MG tablet Take 1 tablet by mouth daily. 06/23/18   [name], MD  nitrofurantoin, macrocrystal-monohydrate, (MACROBID) 100 MG capsule Take 1 capsule (100 mg total) by mouth 2 (two) times daily. 08/03/18   Chancy Milroy, MD  oxybutynin (DITROPAN) 5 MG tablet Take 1  tablet (5 mg total) by mouth 3 (three) times daily. 06/23/18   [name], MD  pregabalin (LYRICA) 75 MG capsule Take 1 capsule (75 mg total) by mouth 2 (two) times daily. 06/23/18   [name], MD  triamcinolone cream (KENALOG) 0.1 % Apply 1 application topically 2 (two) times daily. Patient not taking: Reported on 08/03/2018 02/07/18   Orvan July, NP    Family History Family History  Problem Relation Age of Onset  . Colon cancer Mother   . Other Neg Hx   . Breast cancer Neg Hx     Social History Social History   Tobacco Use  . Smoking status: Never Smoker  . Smokeless tobacco: Never Used  Substance Use Topics  . Alcohol use: No  . Drug use: No    Types: Cocaine, Marijuana    Comment: 8 years ago for the cocaine  1 year ago with marijuana     Allergies   Patient has no known allergies.   Review of Systems Review of Systems  Constitutional: Positive for fever.  HENT: Positive for congestion.   Respiratory: Positive for cough.   Cardiovascular: Negative for chest pain.  Gastrointestinal: Positive for diarrhea. Negative for abdominal pain and vomiting.  All other systems reviewed and are negative.    Physical Exam Updated Vital Signs BP (!) 169/81 (BP Location: Right Arm)   Pulse (!) 104   Temp 100.1 F (37.8 C) (Oral)   Resp 16   LMP 04/01/2010   SpO2 93%   Physical Exam Vitals signs and nursing note reviewed.  Constitutional:      General: She is not in acute distress.    Appearance: Normal appearance. She is well-developed.  HENT:     Head: Normocephalic and atraumatic.  Eyes:     Conjunctiva/sclera: Conjunctivae normal.     Pupils: Pupils are equal, round, and reactive to light.  Neck:     Musculoskeletal: Normal range of motion and neck supple.  Cardiovascular:     Rate and Rhythm: Normal rate and regular rhythm.     Heart sounds: Normal heart sounds.  Pulmonary:     Effort: Pulmonary effort is normal. No respiratory distress.      Breath sounds: Normal breath sounds.  Abdominal:     General: There is no distension.     Palpations: Abdomen is soft.     Tenderness: There is no abdominal tenderness.  Musculoskeletal: Normal range of motion.        General: No deformity.  Skin:    General: Skin is warm and dry.  Neurological:     General: No focal deficit present.     Mental Status: She is alert and oriented to person, place, and time. Mental status is at baseline.      ED Treatments /  Results  Labs (all labs ordered are listed, but only abnormal results are displayed) Labs Reviewed  COMPREHENSIVE METABOLIC PANEL - Abnormal; Notable for the following components:      Result Value   CO2 18 (*)    Glucose, Bld 278 (*)    BUN 26 (*)    Creatinine, Ser 1.95 (*)    Calcium 8.0 (*)    Albumin 3.2 (*)    GFR calc non Af Amer 28 (*)    GFR calc Af Amer 32 (*)    All other components within normal limits  CBC WITH DIFFERENTIAL/PLATELET - Abnormal; Notable for the following components:   RBC 1.54 (*)    Hemoglobin 4.0 (*)    HCT 13.4 (*)    MCHC 29.9 (*)    All other components within normal limits  CBG MONITORING, ED - Abnormal; Notable for the following components:   Glucose-Capillary 205 (*)    All other components within normal limits  LACTIC ACID, PLASMA  URINALYSIS, ROUTINE W REFLEX MICROSCOPIC  LACTIC ACID, PLASMA  INFLUENZA PANEL BY PCR (TYPE A & B)  VITAMIN B12  FOLATE  IRON AND TIBC  FERRITIN  RETICULOCYTES  POC OCCULT BLOOD, ED  TYPE AND SCREEN  PREPARE RBC (CROSSMATCH)  ABO/RH    EKG EKG Interpretation  Date/Time:  Thursday August 17 2018 11:35:06 EST Ventricular Rate:  97 PR Interval:    QRS Duration: 93 QT Interval:  370 QTC Calculation: 470 R Axis:   20 Text Interpretation:  Sinus rhythm Abnormal R-wave progression, early transition Confirmed by Dene Gentry [phone]) on 08/17/2018 11:48:21 AM Also confirmed by Dene Gentry [phone]), editor [place], Jeannetta Nap [phone])  on  08/17/2018 11:51:23 AM   Radiology Dg Chest Port 1 View  Result Date: 08/17/2018 CLINICAL DATA:  Flu-like symptoms with cough and fever for 2 days EXAM: PORTABLE CHEST 1 VIEW COMPARISON:  02/26/2018 FINDINGS: Cardiac shadow is prominent but accentuated by the portable technique. The lungs are well aerated bilaterally. No focal infiltrate or effusion is seen. No acute bony abnormality is noted. IMPRESSION: No active disease. Electronically Signed   By: Inez Catalina M.D.   On: 08/17/2018 09:47    Procedures Procedures (including critical care time)  Medications Ordered in ED Medications  sodium chloride 0.9 % bolus 500 mL (has no administration in time range)  albuterol (PROVENTIL) (2.5 MG/3ML) 0.083% nebulizer solution (has no administration in time range)  acetaminophen (TYLENOL) tablet 1,000 mg (1,000 mg Oral Given 08/17/18 0954)  ipratropium-albuterol (DUONEB) 0.5-2.5 (3) MG/3ML nebulizer solution 3 mL (3 mLs Nebulization Given 08/17/18 0942)     Initial Impression / Assessment and Plan / ED Course  I have reviewed the triage vital signs and the nursing notes.  Pertinent labs & imaging results that were available during my care of the patient were reviewed by me and considered in my medical decision making (see chart for details).     MDM  Screen complete  Patient is presenting for evaluation of URI symptoms.   Patient's screening labs did reveal significant anemia with a hemoglobin of 4.  This appears to be significantly lower than her baseline.  She also appears dehydrated on exam and on labs.  I suspect that her symptoms are combination of a viral infection with concurrent significant anemia.  Patient would benefit from transfusion.  She would also benefit from admission for IV fluids.  Hospitalist  [facility]) service are aware of case and will evaluate for admission.  Final Clinical Impressions(s) /  ED Diagnoses   Final diagnoses:  Fever, unspecified fever cause  Anemia,  unspecified type    ED Discharge Orders    None       Valarie Merino, MD 08/17/18 1223

## 2018-08-17 NOTE — Progress Notes (Signed)
Pt admitted to the unit from ED: pt A&O x4 with spouse to the bedside; pt oriented to the unit and room; fall/safety precaution and prevention education completed. Pt skin clean, dry and intact with no wounds or pressure ulcer noted; skin assess completed with second RN Rod Holler per protocol. Flu swab sent and droplet precaution initiated. Pt remains on 2L oxygen from ED as oxygen drops to the 80's on RA. VSS: bed alarm on, call light within reach. Will closely monitor pt. Delia Heady RN   08/17/18 1331  Vitals  Temp 98.5 F (36.9 C)  Temp Source Oral  BP 126/68  MAP (mmHg) 84  BP Location Left Arm  BP Method Automatic  Patient Position (if appropriate) Lying  Pulse Rate 87  Pulse Rate Source Dinamap  Resp 16  Oxygen Therapy  SpO2 96 %  O2 Device Nasal Cannula  O2 Flow Rate (L/min) 2 L/min  MEWS Score  MEWS RR 0  MEWS Pulse 0  MEWS Systolic 0  MEWS LOC 0  MEWS Temp 0  MEWS Score 0  MEWS Score Color Nyoka Cowden

## 2018-08-17 NOTE — H&P (Signed)
History and Physical    [name] [name]:[id number] DOB: 1960-10-10 DOA: 08/17/2018  PCP: [name], MD Consultants:  None Patient coming from:  Home - lives with boyfriend; NOK: Boyfriend, [phone]  Chief Complaint: Viral syndrome  HPI: [name] [name] is a 58 y.o. female with medical history significant of HTN; HSV; DM; and chronic Hep C presenting with viral symptoms.  She reports feeling anxious, shaking, coughing, difficulty standing up.  Symptoms started 2 days ago and started quickly.  She had been around a friend with a bad cough and thought she got it from her.  She overate a few days ago and developed a severe cough.  Alternating hot/cold sensations.  She developed chest discomfort.  She was weak.  +SOB.  Possible mild subjective fever.   ED Course:  Viral syndrome - congestion, aching, cough.  Sounds like flu - pending.  Has h/o anemia, Hgb 4.0.  Heme negative and without apparent symptoms.  Mildly increased BUN/Creatinine so also mildly dehydration.  Giving 2 units PRBC.  Given neb with improved sats, normal CXR.  She was not given antibiotics.  Review of Systems: As per HPI; otherwise review of systems reviewed and negative.   Ambulatory Status:  Ambulates without assistance  Past Medical History:  Diagnosis Date  . Anemia   . Chronic hepatitis C without hepatic coma ([place]) 11/09/2016  . Diabetes mellitus   . Fibroids   . 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 Hypertension   . MRSA (methicillin resistant Staphylococcus aureus)   . Trichomonas   . VAGINITIS, BACTERIAL, RECURRENT 08/15/2007   Qualifier: Diagnosis of  By: Radene Ou MD, [place]      Past Surgical History:  Procedure Laterality Date  . CESAREAN SECTION     breech    Social History   Socioeconomic History  . Marital status: Legally Separated    Spouse name: Not on file  . Number of children: Not on file  . Years of education: Not on file  . Highest education  level: Not on file  Occupational History  . Occupation: disability  Social Needs  . Financial resource strain: Not on file  . Food insecurity:    Worry: Never true    Inability: Never true  . Transportation needs:    Medical: No    Non-medical: No  Tobacco Use  . Smoking status: Never Smoker  . Smokeless tobacco: Never Used  Substance and Sexual Activity  . Alcohol use: No  . Drug use: No    Types: Cocaine, Marijuana    Comment: remote h/o cocaine and marijuana use  . Sexual activity: Yes    Birth control/protection: None  Lifestyle  . Physical activity:    Days per week: Not on file    Minutes per session: Not on file  . Stress: Not on file  Relationships  . Social connections:    Talks on phone: Not on file    Gets together: Not on file    Attends religious service: Not on file    Active member of club or organization: Not on file    Attends meetings of clubs or organizations: Not on file    Relationship status: Not on file  . Intimate partner violence:    Fear of current or ex partner: Not on file    Emotionally abused: Not on file    Physically abused: Not on file    Forced sexual activity: Not on file  Other Topics Concern  . Not on file  Social History Narrative  . Not on file    No Known Allergies  Family History  Problem Relation Age of Onset  . Colon cancer Mother   . Other Neg Hx   . Breast cancer Neg Hx     Prior to Admission medications   Medication Sig Start Date End Date Taking? Authorizing Provider  albuterol (PROVENTIL HFA;VENTOLIN HFA) 108 (90 Base) MCG/ACT inhaler Inhale 2 puffs into the lungs every 6 (six) hours as needed for wheezing or shortness of breath. 03/15/18   Thereasa Solo, Dionne Bucy, PA-C  amLODipine (NORVASC) 10 MG tablet Take 1 tablet (10 mg total) by mouth daily. 06/23/18   [name], MD  atorvastatin (LIPITOR) 20 MG tablet Take 1 tablet (20 mg total) by mouth daily. 06/23/18   [name], MD  diphenoxylate-atropine  (LOMOTIL) 2.5-0.025 MG tablet Take 1 tablet by mouth 3 (three) times daily with meals. 06/23/18   [name], MD  Insulin Glargine (LANTUS SOLOSTAR) 100 UNIT/ML Solostar Pen Inject 60 Units into the skin at bedtime. 06/23/18   [name], MD  liraglutide (VICTOZA) 18 MG/3ML SOPN Inject 0.3 mLs (1.8 mg total) into the skin daily with breakfast. 06/23/18   [name], MD  lisinopril-hydrochlorothiazide (PRINZIDE,ZESTORETIC) 20-25 MG tablet Take 1 tablet by mouth daily. 06/23/18   [name], MD  nitrofurantoin, macrocrystal-monohydrate, (MACROBID) 100 MG capsule Take 1 capsule (100 mg total) by mouth 2 (two) times daily. 08/03/18   Chancy Milroy, MD  oxybutynin (DITROPAN) 5 MG tablet Take 1 tablet (5 mg total) by mouth 3 (three) times daily. 06/23/18   [name], MD  pregabalin (LYRICA) 75 MG capsule Take 1 capsule (75 mg total) by mouth 2 (two) times daily. 06/23/18   [name], MD  triamcinolone cream (KENALOG) 0.1 % Apply 1 application topically 2 (two) times daily. Patient not taking: Reported on 08/03/2018 02/07/18   Orvan July, NP    Physical Exam: Vitals:   08/17/18 0857 08/17/18 1303 08/17/18 1304 08/17/18 1331  BP: (!) 169/81  127/73 126/68  Pulse: (!) 104  88 87  Resp: 16  12 16   Temp: 100.1 F (37.8 C)     TempSrc: Oral     SpO2: 93% 95%  96%     . General:  Appears calm and comfortable and is NAD but is mildly ill-appearing; obese . Eyes:  PERRL, EOMI, normal lids, iris . ENT:  grossly normal hearing, lips & tongue, mmm . Neck:  no LAD, masses or thyromegaly . Cardiovascular:  RRR, no m/r/g. No LE edema.  Marland Kitchen Respiratory:  CTA bilaterally with no wheezes/rales/rhonchi.  Normal respiratory effort. . Abdomen:  soft, NT, ND, NABS . Back:   normal alignment, no CVAT . Skin:  no rash or induration seen on limited exam . Musculoskeletal:  grossly normal tone BUE/BLE, good ROM, no bony abnormality . Psychiatric:  grossly normal mood and affect, speech  fluent and appropriate, AOx3 . Neurologic:  CN 2-12 grossly intact, moves all extremities in coordinated fashion, sensation intact    Radiological Exams on Admission: Dg Chest Port 1 View  Result Date: 08/17/2018 CLINICAL DATA:  Flu-like symptoms with cough and fever for 2 days EXAM: PORTABLE CHEST 1 VIEW COMPARISON:  02/26/2018 FINDINGS: Cardiac shadow is prominent but accentuated by the portable technique. The lungs are well aerated bilaterally. No focal infiltrate or effusion is seen. No acute bony abnormality is noted. IMPRESSION: No active disease. Electronically Signed  By: Inez Catalina M.D.   On: 08/17/2018 09:47    EKG: Independently reviewed.  NSR with rate 97; nonspecific ST changes with no evidence of acute ischemia   Labs on Admission: I have personally reviewed the available labs and imaging studies at the time of the admission.  Pertinent labs:   CO2 18 Glucose 278 BUN 26/Creatinine 1.95/GFR 32; 28/1.08/>60 on 01/09/18 Albumin 3.2 Lactate 0.9 WBC 4.3 Hgb 4.0; 9.4 on 8/11 Heme negative A1c 9.2 on 12/6  Assessment/Plan Principal Problem:   Symptomatic anemia Active Problems:   Essential hypertension   Type 2 diabetes mellitus ([facility])   Hyperlipidemia   Chronic hepatitis C without hepatic coma ([facility])   Influenza A   Acute renal failure (ARF) ([facility])   Symptomatic anemia -Patient with prior h/o iron deficiency anemia -Presenting for viral syndrome and found to have Hgb 4 -Normal MCV and RDW -Heme negative -Iron level is low -Will observe in med surg bed. -Transfuse 2 units PRBC to start and recheck Hgb afterwards.  Given her very low starting Hgb, she is likely to require more units.   -Patient counseled about short- and long-term risks associated with transfusion and consents to receive blood products. -She is likely to benefit from IV iron infusion prior to d/c.  Influenza A -Supportive care -Tamiflu BID -IVF at 100 cc/hr -Observation on  telemetry -Anticipate improvement in symptoms as flu improves  Acute renal failure -Likely associated with dehydration in the setting of influenza while taking ACE and diuretic -Hold nephrotoxic medications -Rehydrate -Recheck BMP in AM  HTN -Continue Norvasc -Hold lisinopril-HCTZ  HLD -Continue Lipitor  DM -Recent A1c 9.2, poor control -hold Victoza -Continue Lantus -Cover with resistant-scale SSI  Hep C -Not taking medication at this time   DVT prophylaxis:  SCDs Code Status:  Full - confirmed with patient/family Family Communication: Boyfriend present throughout evaluation  Disposition Plan:  Home once clinically improved Consults called: None  Admission status: It is my clinical opinion that referral for OBSERVATION is reasonable and necessary in this patient based on the above information provided. The aforementioned taken together are felt to place the patient at high risk for further clinical deterioration. However it is anticipated that the patient may be medically stable for discharge from the hospital within 24 to 48 hours.    Karmen Bongo MD [facility]   How to contact the [facility] Attending or Consulting provider [place] or covering provider during after hours [place], for this patient?  1. Check the care team in [facility] and look for a) attending/consulting [facility] provider listed and b) the [facility] team listed 2. Log into [email or web address] and use [place]'s universal password to access. If you do not have the password, please contact the hospital operator. 3. Locate the [facility] provider you are looking for under [facility] and page to a number that you can be directly reached. 4. If you still have difficulty reaching the provider, please page the [facility] (Director on Call) for the Hospitalists listed on amion for assistance.   08/17/2018, 5:21 PM

## 2018-08-18 DIAGNOSIS — N179 Acute kidney failure, unspecified: Secondary | ICD-10-CM

## 2018-08-18 DIAGNOSIS — Z800 Family history of malignant neoplasm of digestive organs: Secondary | ICD-10-CM | POA: Diagnosis not present

## 2018-08-18 DIAGNOSIS — E11311 Type 2 diabetes mellitus with unspecified diabetic retinopathy with macular edema: Secondary | ICD-10-CM

## 2018-08-18 DIAGNOSIS — Z794 Long term (current) use of insulin: Secondary | ICD-10-CM | POA: Diagnosis not present

## 2018-08-18 DIAGNOSIS — E860 Dehydration: Secondary | ICD-10-CM | POA: Diagnosis present

## 2018-08-18 DIAGNOSIS — I10 Essential (primary) hypertension: Secondary | ICD-10-CM | POA: Diagnosis not present

## 2018-08-18 DIAGNOSIS — K529 Noninfective gastroenteritis and colitis, unspecified: Secondary | ICD-10-CM | POA: Diagnosis present

## 2018-08-18 DIAGNOSIS — E785 Hyperlipidemia, unspecified: Secondary | ICD-10-CM | POA: Diagnosis present

## 2018-08-18 DIAGNOSIS — R05 Cough: Secondary | ICD-10-CM | POA: Diagnosis not present

## 2018-08-18 DIAGNOSIS — J101 Influenza due to other identified influenza virus with other respiratory manifestations: Secondary | ICD-10-CM | POA: Diagnosis not present

## 2018-08-18 DIAGNOSIS — B182 Chronic viral hepatitis C: Secondary | ICD-10-CM

## 2018-08-18 DIAGNOSIS — R509 Fever, unspecified: Secondary | ICD-10-CM | POA: Diagnosis not present

## 2018-08-18 DIAGNOSIS — Z79899 Other long term (current) drug therapy: Secondary | ICD-10-CM | POA: Diagnosis not present

## 2018-08-18 DIAGNOSIS — E1165 Type 2 diabetes mellitus with hyperglycemia: Secondary | ICD-10-CM | POA: Diagnosis present

## 2018-08-18 DIAGNOSIS — R0902 Hypoxemia: Secondary | ICD-10-CM | POA: Diagnosis present

## 2018-08-18 DIAGNOSIS — D500 Iron deficiency anemia secondary to blood loss (chronic): Secondary | ICD-10-CM | POA: Diagnosis present

## 2018-08-18 DIAGNOSIS — D649 Anemia, unspecified: Secondary | ICD-10-CM | POA: Diagnosis not present

## 2018-08-18 LAB — BASIC METABOLIC PANEL
Anion gap: 6 (ref 5–15)
BUN: 25 mg/dL — ABNORMAL HIGH (ref 6–20)
CO2: 22 mmol/L (ref 22–32)
Calcium: 7.5 mg/dL — ABNORMAL LOW (ref 8.9–10.3)
Chloride: 115 mmol/L — ABNORMAL HIGH (ref 98–111)
Creatinine, Ser: 1.66 mg/dL — ABNORMAL HIGH (ref 0.44–1.00)
GFR calc Af Amer: 39 mL/min — ABNORMAL LOW (ref >60)
GFR, EST NON AFRICAN AMERICAN: 34 mL/min — AB (ref >60)
Glucose, Bld: 127 mg/dL — ABNORMAL HIGH (ref 70–99)
Potassium: 4.2 mmol/L (ref 3.5–5.1)
Sodium: 143 mmol/L (ref 135–145)

## 2018-08-18 LAB — CBC WITH DIFFERENTIAL/PLATELET
Abs Immature Granulocytes: 0.01 10*3/uL (ref 0.00–0.07)
Basophils Absolute: 0 10*3/uL (ref 0.0–0.1)
Basophils Relative: 0 %
Eosinophils Absolute: 0 10*3/uL (ref 0.0–0.5)
Eosinophils Relative: 0 %
HCT: 30.3 % — ABNORMAL LOW (ref 36.0–46.0)
Hemoglobin: 9.6 g/dL — ABNORMAL LOW (ref 12.0–15.0)
Immature Granulocytes: 0 %
Lymphocytes Relative: 29 %
Lymphs Abs: 1 10*3/uL (ref 0.7–4.0)
MCH: 27 pg (ref 26.0–34.0)
MCHC: 31.7 g/dL (ref 30.0–36.0)
MCV: 85.1 fL (ref 80.0–100.0)
MONO ABS: 0.3 10*3/uL (ref 0.1–1.0)
Monocytes Relative: 8 %
Neutro Abs: 2.2 10*3/uL (ref 1.7–7.7)
Neutrophils Relative %: 63 %
Platelets: 125 10*3/uL — ABNORMAL LOW (ref 150–400)
RBC: 3.56 MIL/uL — ABNORMAL LOW (ref 3.87–5.11)
RDW: 14.1 % (ref 11.5–15.5)
WBC: 3.5 10*3/uL — ABNORMAL LOW (ref 4.0–10.5)
nRBC: 0 % (ref 0.0–0.2)

## 2018-08-18 LAB — BPAM RBC
Blood Product Expiration Date: 202002072359
Blood Product Expiration Date: 202002212359
ISSUE DATE / TIME: 202001301730
ISSUE DATE / TIME: 202001302023
UNIT TYPE AND RH: 600
Unit Type and Rh: 600

## 2018-08-18 LAB — TYPE AND SCREEN
ABO/RH(D): A NEG
Antibody Screen: NEGATIVE
Unit division: 0
Unit division: 0

## 2018-08-18 LAB — CBC
HCT: 30.7 % — ABNORMAL LOW (ref 36.0–46.0)
Hemoglobin: 9.3 g/dL — ABNORMAL LOW (ref 12.0–15.0)
MCH: 25.9 pg — ABNORMAL LOW (ref 26.0–34.0)
MCHC: 30.3 g/dL (ref 30.0–36.0)
MCV: 85.5 fL (ref 80.0–100.0)
Platelets: 136 10*3/uL — ABNORMAL LOW (ref 150–400)
RBC: 3.59 MIL/uL — ABNORMAL LOW (ref 3.87–5.11)
RDW: 14 % (ref 11.5–15.5)
WBC: 3.6 10*3/uL — ABNORMAL LOW (ref 4.0–10.5)
nRBC: 0 % (ref 0.0–0.2)

## 2018-08-18 LAB — HIV ANTIBODY (ROUTINE TESTING W REFLEX): HIV Screen 4th Generation wRfx: NONREACTIVE

## 2018-08-18 LAB — GLUCOSE, CAPILLARY
Glucose-Capillary: 178 mg/dL — ABNORMAL HIGH (ref 70–99)
Glucose-Capillary: 162 mg/dL — ABNORMAL HIGH (ref 70–99)
Glucose-Capillary: 75 mg/dL (ref 70–99)
Glucose-Capillary: 239 mg/dL — ABNORMAL HIGH (ref 70–99)
Glucose-Capillary: 319 mg/dL — ABNORMAL HIGH (ref 70–99)
Glucose-Capillary: 326 mg/dL — ABNORMAL HIGH (ref 70–99)

## 2018-08-18 MED ORDER — METHYLPREDNISOLONE SODIUM SUCC 125 MG IJ SOLR
125.0000 mg | INTRAMUSCULAR | Status: AC
  Administered 2018-08-18: 125 mg via INTRAVENOUS
  Filled 2018-08-18: qty 2

## 2018-08-18 MED ORDER — GUAIFENESIN ER 600 MG PO TB12
600.0000 mg | ORAL_TABLET | Freq: Two times a day (BID) | ORAL | Status: DC
  Administered 2018-08-18 – 2018-08-19 (×3): 600 mg via ORAL
  Filled 2018-08-18 (×3): qty 1

## 2018-08-18 NOTE — Progress Notes (Signed)
Pt transferring to 5w and report called off to RN on the unit. Pt to be transported off unit via wheelchair with belongings to the side. Delia Heady RN

## 2018-08-18 NOTE — Progress Notes (Signed)
In house transfer from [facility]: patient arrived via stretcher at 1730 and was able to ambulate to bed in room 13 ([place]). No distress noted. Unit introduction provided. Safety precautions initiated. Call bell within reach. Encouraged to report needs and/or concerns to RN.

## 2018-08-18 NOTE — Progress Notes (Signed)
Patient rested comfortably without any distress noted. Hourly rounded for patient safety.

## 2018-08-18 NOTE — Progress Notes (Signed)
Pt ambulated in hallways as ordered with NT. Per NT pt oxygen dropped on RA during ambulation to 83% and remained in the 80's for the most part of the walk. Pt placed on 2L and oxygen up to 93%. Pt in bed with call light within reach. Will closely monitor. Delia Heady RN

## 2018-08-18 NOTE — Progress Notes (Signed)
Pt received 2 units of blood with no reaction noted; VSS; pt resting comfortably in bed with call light within reach. Reported off to oncoming RN. Mamie Nick AmoKuffour RN

## 2018-08-19 DIAGNOSIS — I10 Essential (primary) hypertension: Secondary | ICD-10-CM

## 2018-08-19 DIAGNOSIS — Z800 Family history of malignant neoplasm of digestive organs: Secondary | ICD-10-CM

## 2018-08-19 LAB — CBC
HCT: 32 % — ABNORMAL LOW (ref 36.0–46.0)
Hemoglobin: 10 g/dL — ABNORMAL LOW (ref 12.0–15.0)
MCH: 26 pg (ref 26.0–34.0)
MCHC: 31.3 g/dL (ref 30.0–36.0)
MCV: 83.1 fL (ref 80.0–100.0)
NRBC: 0 % (ref 0.0–0.2)
Platelets: 128 10*3/uL — ABNORMAL LOW (ref 150–400)
RBC: 3.85 MIL/uL — ABNORMAL LOW (ref 3.87–5.11)
RDW: 14 % (ref 11.5–15.5)
WBC: 3.8 10*3/uL — ABNORMAL LOW (ref 4.0–10.5)

## 2018-08-19 LAB — BASIC METABOLIC PANEL
ANION GAP: 8 (ref 5–15)
BUN: 29 mg/dL — ABNORMAL HIGH (ref 6–20)
CALCIUM: 8 mg/dL — AB (ref 8.9–10.3)
CO2: 19 mmol/L — ABNORMAL LOW (ref 22–32)
Chloride: 109 mmol/L (ref 98–111)
Creatinine, Ser: 1.56 mg/dL — ABNORMAL HIGH (ref 0.44–1.00)
GFR calc Af Amer: 42 mL/min — ABNORMAL LOW (ref >60)
GFR, EST NON AFRICAN AMERICAN: 37 mL/min — AB (ref >60)
Glucose, Bld: 338 mg/dL — ABNORMAL HIGH (ref 70–99)
Potassium: 4.7 mmol/L (ref 3.5–5.1)
Sodium: 136 mmol/L (ref 135–145)

## 2018-08-19 LAB — GLUCOSE, CAPILLARY
Glucose-Capillary: 298 mg/dL — ABNORMAL HIGH (ref 70–99)
Glucose-Capillary: 261 mg/dL — ABNORMAL HIGH (ref 70–99)
Glucose-Capillary: 266 mg/dL — ABNORMAL HIGH (ref 70–99)

## 2018-08-19 MED ORDER — FERROUS SULFATE 325 (65 FE) MG PO TABS: 325.0000 mg | ORAL_TABLET | Freq: Three times a day (TID) | ORAL | 0 refills | Status: DC

## 2018-08-19 MED ORDER — GUAIFENESIN ER 600 MG PO TB12: 600.0000 mg | ORAL_TABLET | Freq: Two times a day (BID) | ORAL | 0 refills | Status: DC

## 2018-08-19 MED ORDER — PREDNISONE 20 MG PO TABS
40.0000 mg | ORAL_TABLET | Freq: Every day | ORAL | Status: DC
  Administered 2018-08-19: 40 mg via ORAL
  Filled 2018-08-19: qty 2

## 2018-08-19 MED ORDER — FERROUS SULFATE 325 (65 FE) MG PO TABS
325.0000 mg | ORAL_TABLET | Freq: Three times a day (TID) | ORAL | Status: DC
  Administered 2018-08-19: 325 mg via ORAL
  Filled 2018-08-19: qty 1

## 2018-08-19 MED ORDER — PREDNISONE 20 MG PO TABS: 40.0000 mg | ORAL_TABLET | Freq: Every day | ORAL | 0 refills | Status: AC

## 2018-08-19 MED ORDER — PANTOPRAZOLE SODIUM 40 MG PO TBEC: 40.0000 mg | DELAYED_RELEASE_TABLET | Freq: Every day | ORAL | 0 refills | Status: DC

## 2018-08-19 MED ORDER — PANTOPRAZOLE SODIUM 40 MG PO TBEC
40.0000 mg | DELAYED_RELEASE_TABLET | Freq: Every day | ORAL | Status: DC
  Administered 2018-08-19: 40 mg via ORAL
  Filled 2018-08-19: qty 1

## 2018-08-19 MED ORDER — OSELTAMIVIR PHOSPHATE 30 MG PO CAPS: 30.0000 mg | ORAL_CAPSULE | Freq: Two times a day (BID) | ORAL | 0 refills | Status: AC

## 2018-08-19 MED ORDER — SODIUM CHLORIDE 0.9 % IV SOLN
INTRAVENOUS | Status: DC
  Administered 2018-08-19: 06:00:00 via INTRAVENOUS

## 2018-08-19 NOTE — Progress Notes (Signed)
Progress Note    [name] [name]  AYT:[id number] DOB: 03/01/1961  DOA: 08/17/2018 PCP: [name], MD    Brief Narrative:   Chief complaint: Cough  Medical records reviewed and are as summarized below:  [name] [name] is an 58 y.o. female with past medical history significant for HTN, HLD, DM type II, and chronic hepatitis C; who presents with cough, malaise, and fever.  Influenza a screen positive and initial hemoglobin 4.  Assessment/Plan:   Principal Problem:   Symptomatic anemia Active Problems:   Essential hypertension   Type 2 diabetes mellitus (HCC)   Hyperlipidemia   Chronic hepatitis C without hepatic coma (HCC)   Influenza A   Acute renal failure (ARF) (HCC)    Influenza A with bronchitis, hypoxia: Acute.  Influenza a.  Patient did not receive her yearly flu shot.  Patient requiring 3 L nasal cannula oxygen currently with wheezing on physical exam. -Nasal cannula oxygen as needed -Continue Tamiflu -IV Solu-Medrol x1 -Breathing treatments as needed -Mucinex   Anemia of chronic blood loss: On admission hemoglobin noted to be 4 with negative stool guaiacs.  Patient has been typed and screened and transfused 2 units of packed red blood cells.  However repeat hemoglobin this a.m. 9.6.  Iron 18, saturation ratios 7.  TIBC and ferritin within normal limits suggesting likely chronic. -Recheck CBC in a.m. -Consider need of iron infusion versus oral supplement patient clinically doing  Diabetes mellitus type 2, uncontrolled hemoglobin A1c 9.2 -Hypoglycemic protocols -Continue Lantus and sliding scale insulin  History of hepatitis C: Patient not on any medications at this time.  There is no height or weight on file to calculate BMI.   Family Communication/Anticipated D/C date and plan/Code Status   DVT prophylaxis: Lovenox ordered. Code Status: Full Code.  Family Communication: No family present at bedside Disposition Plan: Possible discharge home in 1 to 2  days   Medical Consultants:    None.  Anti-Infectives:    Tamiflu  Subjective:   Patient reports that she still feels pretty terrible and has continued to cough.  Objective:    Vitals:   08/18/18 1300 08/18/18 1731 08/18/18 1812 08/18/18 2135  BP: 138/78 (!) 156/85 (!) 153/90 (!) 154/82  Pulse: 84 83 81 71  Resp: 18 14  16   Temp: 98.2 F (36.8 C) 97.6 F (36.4 C) 98 F (36.7 C) 97.7 F (36.5 C)  TempSrc: Oral Oral Oral Oral  SpO2: 93% 94% (!) 88% (!) 89%   No intake or output data in the 24 hours ending 08/19/18 0416 There were no vitals filed for this visit.  Exam: Constitutional: Acutely sick appearing middle-aged female with a [employer] and snicker bar at bedside Eyes: PERRL, lids and conjunctivae normal ENMT: Mucous membranes are dry. Posterior pharynx clear of any exudate or lesions. Normal dentition.  Neck: normal, supple, no masses, no thyromegaly Respiratory: Bilateral wheezes noted with patient on 3 L nasal cannula oxygen.  Speaking in shortened sentences.  No significant accessory muscle usage noted. Cardiovascular: Regular rate and rhythm, no murmurs / rubs / gallops. No extremity edema. 2+ pedal pulses. No carotid bruits.  Abdomen: no tenderness, no masses palpated. No hepatosplenomegaly. Bowel sounds positive.  Musculoskeletal: no clubbing / cyanosis. No joint deformity upper and lower extremities. Good ROM, no contractures. Normal muscle tone.  Skin: no rashes, lesions, ulcers. No induration Neurologic: CN 2-12 grossly intact. Sensation intact, DTR normal. Strength 4+/5 in all 4.  Psychiatric: Normal judgment and insight. Alert and oriented  x 3. Normal mood.    Data Reviewed:   I have personally reviewed following labs and imaging studies:  Labs: Labs show the following:   Basic Metabolic Panel: Recent Labs  Lab 08/17/18 0907 08/18/18 0516  NA 139 143  K 4.2 4.2  CL 111 115*  CO2 18* 22  GLUCOSE 278* 127*  BUN 26* 25*  CREATININE 1.95*  1.66*  CALCIUM 8.0* 7.5*   GFR CrCl cannot be calculated (Unknown ideal weight.). Liver Function Tests: Recent Labs  Lab 08/17/18 0907  AST 24  ALT 13  ALKPHOS 60  BILITOT 0.8  PROT 7.0  ALBUMIN 3.2*   No results for input(s): LIPASE, AMYLASE in the last 168 hours. No results for input(s): AMMONIA in the last 168 hours. Coagulation profile No results for input(s): INR, PROTIME in the last 168 hours.  CBC: Recent Labs  Lab 08/17/18 0907 08/18/18 0516 08/18/18 0644  WBC 4.3 3.6* 3.5*  NEUTROABS 2.9  --  2.2  HGB 4.0* 9.3* 9.6*  HCT 13.4* 30.7* 30.3*  MCV 87.0 85.5 85.1  PLT 189 136* 125*   Cardiac Enzymes: No results for input(s): CKTOTAL, CKMB, CKMBINDEX, TROPONINI in the last 168 hours. BNP (last 3 results) No results for input(s): PROBNP in the last 8760 hours. CBG: Recent Labs  Lab 08/18/18 0812 08/18/18 1155 08/18/18 1647 08/18/18 1735 08/18/18 2148  GLUCAP 162* 75 239* 319* 326*   D-Dimer: No results for input(s): DDIMER in the last 72 hours. Hgb A1c: No results for input(s): HGBA1C in the last 72 hours. Lipid Profile: No results for input(s): CHOL, HDL, LDLCALC, TRIG, CHOLHDL, LDLDIRECT in the last 72 hours. Thyroid function studies: No results for input(s): TSH, T4TOTAL, T3FREE, THYROIDAB in the last 72 hours.  Invalid input(s): FREET3 Anemia work up: Recent Labs    08/17/18 1229  VITAMINB12 396  FOLATE 17.1  FERRITIN 97  TIBC 287  IRON 19*  RETICCTPCT 0.8   Sepsis Labs: Recent Labs  Lab 08/17/18 0907 08/17/18 1028 08/18/18 0516 08/18/18 0644  WBC 4.3  --  3.6* 3.5*  LATICACIDVEN  --  0.9  --   --     Microbiology No results found for this or any previous visit (from the past 240 hour(s)).  Procedures and diagnostic studies:  Dg Chest Port 1 View  Result Date: 08/17/2018 CLINICAL DATA:  Flu-like symptoms with cough and fever for 2 days EXAM: PORTABLE CHEST 1 VIEW COMPARISON:  02/26/2018 FINDINGS: Cardiac shadow is prominent  but accentuated by the portable technique. The lungs are well aerated bilaterally. No focal infiltrate or effusion is seen. No acute bony abnormality is noted. IMPRESSION: No active disease. Electronically Signed   By: Inez Catalina M.D.   On: 08/17/2018 09:47    Medications:   . amLODipine  10 mg Oral Daily  . atorvastatin  20 mg Oral Daily  . guaiFENesin  600 mg Oral BID  . insulin aspart  0-20 Units Subcutaneous TID WC  . insulin aspart  0-5 Units Subcutaneous QHS  . insulin glargine  60 Units Subcutaneous QHS  . oseltamivir  30 mg Oral BID  . pregabalin  75 mg Oral BID   Continuous Infusions: Normal saline IV fluids   LOS: 1 day    A   [facility]   *Please refer to [email or web address], password [email or web address] to get updated schedule on who will round on this patient, as hospitalists switch teams weekly. If 7PM-7AM, please contact night-coverage at [email or web address], password [email or web address] for any overnight  needs.

## 2018-08-19 NOTE — Discharge Instructions (Signed)
Anemia  Anemia is a condition in which you do not have enough red blood cells or hemoglobin. Hemoglobin is a substance in red blood cells that carries oxygen. When you do not have enough red blood cells or hemoglobin (are anemic), your body cannot get enough oxygen and your organs may not work properly. As a result, you may feel very tired or have other problems. What are the causes? Common causes of anemia include:  Excessive bleeding. Anemia can be caused by excessive bleeding inside or outside the body, including bleeding from the intestine or from periods in women.  Poor nutrition.  Long-lasting (chronic) kidney, thyroid, and liver disease.  Bone marrow disorders.  Cancer and treatments for cancer.  HIV (human immunodeficiency virus) and AIDS (acquired immunodeficiency syndrome).  Treatments for HIV and AIDS.  Spleen problems.  Blood disorders.  Infections, medicines, and autoimmune disorders that destroy red blood cells. What are the signs or symptoms? Symptoms of this condition include:  Minor weakness.  Dizziness.  Headache.  Feeling heartbeats that are irregular or faster than normal (palpitations).  Shortness of breath, especially with exercise.  Paleness.  Cold sensitivity.  Indigestion.  Nausea.  Difficulty sleeping.  Difficulty concentrating. Symptoms may occur suddenly or develop slowly. If your anemia is mild, you may not have symptoms. How is this diagnosed? This condition is diagnosed based on:  Blood tests.  Your medical history.  A physical exam.  Bone marrow biopsy. Your health care provider may also check your stool (feces) for blood and may do additional testing to look for the cause of your bleeding. You may also have other tests, including:  Imaging tests, such as a CT scan or MRI.  Endoscopy.  Colonoscopy. How is this treated? Treatment for this condition depends on the cause. If you continue to lose a lot of blood, you may  need to be treated at a hospital. Treatment may include:  Taking supplements of iron, vitamin M08, or folic acid.  Taking a hormone medicine (erythropoietin) that can help to stimulate red blood cell growth.  Having a blood transfusion. This may be needed if you lose a lot of blood.  Making changes to your diet.  Having surgery to remove your spleen. Follow these instructions at home:  Take over-the-counter and prescription medicines only as told by your health care provider.  Take supplements only as told by your health care provider.  Follow any diet instructions that you were given.  Keep all follow-up visits as told by your health care provider. This is important. Contact a health care provider if:  You develop new bleeding anywhere in the body. Get help right away if:  You are very weak.  You are short of breath.  You have pain in your abdomen or chest.  You are dizzy or feel faint.  You have trouble concentrating.  You have bloody or black, tarry stools.  You vomit repeatedly or you vomit up blood. Summary  Anemia is a condition in which you do not have enough red blood cells or enough of a substance in your red blood cells that carries oxygen (hemoglobin).  Symptoms may occur suddenly or develop slowly.  If your anemia is mild, you may not have symptoms.  This condition is diagnosed with blood tests as well as a medical history and physical exam. Other tests may be needed.  Treatment for this condition depends on the cause of the anemia. This information is not intended to replace advice given to you by  your health care provider. Make sure you discuss any questions you have with your health care provider. °Document Released: 08/12/2004 Document Revised: 08/06/2016 Document Reviewed: 08/06/2016 °Elsevier Interactive Patient Education © 2019 [employer] ° °

## 2018-08-19 NOTE — Discharge Summary (Signed)
Physician Discharge Summary  [name] [name] LHT:[id number] DOB: 09-02-1960 DOA: 08/17/2018  PCP: [name], MD  Admit date: 08/17/2018 Discharge date: 08/19/2018  Admitted From: Home Disposition:  Home  Recommendations for Outpatient Follow-up:  1. Follow up with PCP in 1 week with repeat CBC 2. Follow-up with GI as an outpatient   Home Health: No Equipment/Devices: None  Discharge Condition: Stable CODE STATUS: Full Diet recommendation: Heart Healthy / Carb Modified  Brief/Interim Summary: 58 year old female with history of hypertension, hyperlipidemia, diabetes mellitus type 2 and chronic pregnancy presented with cough, malaise and fever and anemia with initial hemoglobin of 4.  She was found to have influenza A positive and was started on Tamiflu.  She was transfused packed red cells.  Subsequently hemoglobin has remained stable.  GI has evaluated the patient and recommend outpatient evaluation and follow-up.  She will be discharged home on oral Tamiflu, prednisone and Protonix.  Discharge Diagnoses:  Principal Problem:   Symptomatic anemia Active Problems:   Essential hypertension   Type 2 diabetes mellitus (HCC)   Hyperlipidemia   Chronic hepatitis C without hepatic coma (HCC)   Influenza A   Acute renal failure (ARF) (HCC)  Influenza A bronchitis with hypoxia -Initially required oxygen via nasal cannula at 3 L/min.  Symptoms have improved.  Currently not spiking temperatures.  Started on Tamiflu. -Currently on room air and hypoxia has resolved.  Continue Tamiflu to finish 5-day course of treatment.  Discharge home on oral prednisone 40 mg daily for 5 days.  Acute on chronic anemia -Presented with hemoglobin of 4.  Stool guaiac was negative.  Status post 2 units packed red cells transfusion during the hospitalization.  Hemoglobin is 10 today.  Iron was 18 and iron saturation was 7.  Iron was 18 and iron saturation was 7.  Will discharge on oral ferrous sulfate  supplement along with oral Protonix. -GI has evaluated the patient and recommend outpatient follow-up.  Patient is to be compliant with outpatient follow-up  Diabetes mellitus type 2, uncontrolled with hyperglycemia -Hemoglobin A1c 9.2  -Continue outpatient regimen.  Outpatient follow-up with PCP  History of hepatitis C -Status post treatment as an outpatient.  Outpatient follow-up  Discharge Instructions  Discharge Instructions    Ambulatory referral to Gastroenterology   Complete by:  As directed    Anemia   Call MD for:  difficulty breathing, headache or visual disturbances   Complete by:  As directed    Call MD for:  extreme fatigue   Complete by:  As directed    Call MD for:  persistant nausea and vomiting   Complete by:  As directed    Call MD for:  temperature >100.4   Complete by:  As directed    Diet - low sodium heart healthy   Complete by:  As directed    Diet Carb Modified   Complete by:  As directed    Increase activity slowly   Complete by:  As directed      Allergies as of 08/19/2018   No Known Allergies     Medication List    TAKE these medications   albuterol 108 (90 Base) MCG/ACT inhaler Commonly known as:  PROVENTIL HFA;VENTOLIN HFA Inhale 2 puffs into the lungs every 6 (six) hours as needed for wheezing or shortness of breath.   amLODipine 10 MG tablet Commonly known as:  NORVASC Take 1 tablet (10 mg total) by mouth daily.   atorvastatin 20 MG tablet Commonly known as:  LIPITOR Take  1 tablet (20 mg total) by mouth daily.   ferrous sulfate 325 (65 FE) MG tablet Take 1 tablet (325 mg total) by mouth 3 (three) times daily with meals.   guaiFENesin 600 MG 12 hr tablet Commonly known as:  MUCINEX Take 1 tablet (600 mg total) by mouth 2 (two) times daily.   Insulin Glargine 100 UNIT/ML Solostar Pen Commonly known as:  LANTUS SOLOSTAR Inject 60 Units into the skin at bedtime.   liraglutide 18 MG/3ML Sopn Commonly known as:  VICTOZA Inject 0.3  mLs (1.8 mg total) into the skin daily with breakfast.   lisinopril-hydrochlorothiazide 20-25 MG tablet Commonly known as:  PRINZIDE,ZESTORETIC Take 1 tablet by mouth daily.   oseltamivir 30 MG capsule Commonly known as:  TAMIFLU Take 1 capsule (30 mg total) by mouth 2 (two) times daily for 3 days.   pantoprazole 40 MG tablet Commonly known as:  PROTONIX Take 1 tablet (40 mg total) by mouth daily.   predniSONE 20 MG tablet Commonly known as:  DELTASONE Take 2 tablets (40 mg total) by mouth daily with breakfast for 5 days. Start taking on:  August 20, 2018   pregabalin 75 MG capsule Commonly known as:  LYRICA Take 1 capsule (75 mg total) by mouth 2 (two) times daily.      Follow-up Information    [name], MD. Schedule an appointment as soon as possible for a visit in 1 week(s).   Specialty:  Family Medicine Why:  with repeat cbc Contact information: [address] [place] [address] [phone]          No Known Allergies  Consultations:  GI   Procedures/Studies: Dg Chest Port 1 View  Result Date: 08/17/2018 CLINICAL DATA:  Flu-like symptoms with cough and fever for 2 days EXAM: PORTABLE CHEST 1 VIEW COMPARISON:  02/26/2018 FINDINGS: Cardiac shadow is prominent but accentuated by the portable technique. The lungs are well aerated bilaterally. No focal infiltrate or effusion is seen. No acute bony abnormality is noted. IMPRESSION: No active disease. Electronically Signed   By: Inez Catalina M.D.   On: 08/17/2018 09:47      Subjective: Patient seen and examined at bedside.  She denies worsening shortness of breath or fever.  She thinks that she would be okay going home today.  Discharge Exam: Vitals:   08/19/18 0600 08/19/18 1408  BP: (!) 157/94 (!) 173/102  Pulse: 77 87  Resp: 16 18  Temp: 97.8 F (36.6 C) 98.5 F (36.9 C)  SpO2: 96% 99%   Vitals:   08/18/18 2135 08/19/18 0600 08/19/18 1331 08/19/18 1408  BP: (!) 154/82 (!) 157/94   (!) 173/102  Pulse: 71 77  87  Resp: 16 16  18   Temp: 97.7 F (36.5 C) 97.8 F (36.6 C)  98.5 F (36.9 C)  TempSrc: Oral Oral  Oral  SpO2: (!) 89% 96%  99%  Height:   5\' 4"  (1.626 m)     General: Pt is alert, awake, not in acute distress Cardiovascular: rate controlled, S1/S2 + Respiratory: bilateral decreased breath sounds at bases, some scattered crackles Abdominal: Soft, NT, ND, bowel sounds + Extremities: no edema, no cyanosis    The results of significant diagnostics from this hospitalization (including imaging, microbiology, ancillary and laboratory) are listed below for reference.     Microbiology: No results found for this or any previous visit (from the past 240 hour(s)).   Labs: BNP (last 3 results) Recent Labs    02/26/18 2116  BNP 91.7   Basic Metabolic Panel: Recent Labs  Lab 08/17/18 0907 08/18/18 0516 08/19/18 0647  NA 139 143 136  K 4.2 4.2 4.7  CL 111 115* 109  CO2 18* 22 19*  GLUCOSE 278* 127* 338*  BUN 26* 25* 29*  CREATININE 1.95* 1.66* 1.56*  CALCIUM 8.0* 7.5* 8.0*   Liver Function Tests: Recent Labs  Lab 08/17/18 0907  AST 24  ALT 13  ALKPHOS 60  BILITOT 0.8  PROT 7.0  ALBUMIN 3.2*   No results for input(s): LIPASE, AMYLASE in the last 168 hours. No results for input(s): AMMONIA in the last 168 hours. CBC: Recent Labs  Lab 08/17/18 0907 08/18/18 0516 08/18/18 0644 08/19/18 0647  WBC 4.3 3.6* 3.5* 3.8*  NEUTROABS 2.9  --  2.2  --   HGB 4.0* 9.3* 9.6* 10.0*  HCT 13.4* 30.7* 30.3* 32.0*  MCV 87.0 85.5 85.1 83.1  PLT 189 136* 125* 128*   Cardiac Enzymes: No results for input(s): CKTOTAL, CKMB, CKMBINDEX, TROPONINI in the last 168 hours. BNP: Invalid input(s): POCBNP CBG: Recent Labs  Lab 08/18/18 1647 08/18/18 1735 08/18/18 2148 08/19/18 0733 08/19/18 1141  GLUCAP 239* 319* 326* 298* 261*   D-Dimer No results for input(s): DDIMER in the last 72 hours. Hgb A1c No results for input(s): HGBA1C in the last 72  hours. Lipid Profile No results for input(s): CHOL, HDL, LDLCALC, TRIG, CHOLHDL, LDLDIRECT in the last 72 hours. Thyroid function studies No results for input(s): TSH, T4TOTAL, T3FREE, THYROIDAB in the last 72 hours.  Invalid input(s): FREET3 Anemia work up Recent Labs    08/17/18 1229  VITAMINB12 396  FOLATE 17.1  FERRITIN 97  TIBC 287  IRON 19*  RETICCTPCT 0.8   Urinalysis    Component Value Date/Time   COLORURINE STRAW (A) 04/04/2017 1920   APPEARANCEUR CLEAR 04/04/2017 1920   LABSPEC 1.020 04/04/2017 1920   PHURINE 7.0 04/04/2017 1920   GLUCOSEU >=500 (A) 04/04/2017 1920   HGBUR SMALL (A) 04/04/2017 1920   HGBUR negative 06/29/2010 1356   BILIRUBINUR NEGATIVE 04/04/2017 1920   KETONESUR NEGATIVE 04/04/2017 1920   PROTEINUR >=300 (A) 04/04/2017 1920   UROBILINOGEN 1.0 01/26/2015 0925   NITRITE NEGATIVE 04/04/2017 1920   LEUKOCYTESUR NEGATIVE 04/04/2017 1920   Sepsis Labs Invalid input(s): PROCALCITONIN,  WBC,  LACTICIDVEN Microbiology No results found for this or any previous visit (from the past 240 hour(s)).   Time coordinating discharge: 35 minutes  SIGNED:   Aline August, MD  [facility] 08/19/2018, 2:49 PM Pager: [phone]  If 7PM-7AM, please contact night-coverage [email or web address] Password [email or web address]

## 2018-08-19 NOTE — Consult Note (Signed)
Consultation  Referring Provider: Dr. Starla Link, [facility] Primary Care Physician:  [name], MD Primary Gastroenterologist:  Dr. Henrene Pastor  Reason for Consultation:  Progressive anemia, IDA  HPI: [name] [name] is a 58 y.o. female with a past medical history of hep C status post treatment with Dr. Linus Salmons in 2018, diabetes, hypertension, admitted with profound anemia and influenza (cough, malaise and fever requiring oxygen).  On admission her hemoglobin was noted to be 4 with guaiac negative stool.  She received 2 units of packed red cell transfusion.  Her repeat hemoglobin improved to 9.6.  Her ferritin was normal though her iron saturation was low at 7%.  She denies recent evidence of blood per rectum, hematochezia or melena.  She does recall black stools about 6 months ago.  She has a history of chronic diarrhea and reports stools are loose after eating.  She has been taken off of metformin due to concern of worsening diarrhea.  On 11/17/2016 she saw Alonza Bogus, PA-C for chronic diarrhea and also family history of colon cancer in her mother.  Colonoscopy was arranged with Dr. Henrene Pastor but the patient did not follow through with this appointment.  He has never had a colonoscopy.  She denies abdominal pain, nausea, vomiting, dysphagia and odynophagia.  She still feels weak and short of breath with activity.  Past Medical History:  Diagnosis Date  . Anemia   . Chronic hepatitis C without hepatic coma ([place]) 11/09/2016  . Diabetes mellitus   . Fibroids   . 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 Hypertension   . MRSA (methicillin resistant Staphylococcus aureus)   . Trichomonas   . VAGINITIS, BACTERIAL, RECURRENT 08/15/2007   Qualifier: Diagnosis of  By: Radene Ou MD, [place]      Past Surgical History:  Procedure Laterality Date  . CESAREAN SECTION     breech    Prior to Admission medications   Medication Sig Start Date End Date  Taking? Authorizing Provider  albuterol (PROVENTIL HFA;VENTOLIN HFA) 108 (90 Base) MCG/ACT inhaler Inhale 2 puffs into the lungs every 6 (six) hours as needed for wheezing or shortness of breath. 03/15/18  Yes McClung, Angela M, PA-C  amLODipine (NORVASC) 10 MG tablet Take 1 tablet (10 mg total) by mouth daily. 06/23/18  Yes [name], MD  atorvastatin (LIPITOR) 20 MG tablet Take 1 tablet (20 mg total) by mouth daily. 06/23/18  Yes [name], MD  Insulin Glargine (LANTUS SOLOSTAR) 100 UNIT/ML Solostar Pen Inject 60 Units into the skin at bedtime. 06/23/18  Yes Newlin, Charlane Ferretti, MD  liraglutide (VICTOZA) 18 MG/3ML SOPN Inject 0.3 mLs (1.8 mg total) into the skin daily with breakfast. 06/23/18  Yes Newlin, Enobong, MD  lisinopril-hydrochlorothiazide (PRINZIDE,ZESTORETIC) 20-25 MG tablet Take 1 tablet by mouth daily. 06/23/18  Yes [name], MD  pregabalin (LYRICA) 75 MG capsule Take 1 capsule (75 mg total) by mouth 2 (two) times daily. 06/23/18  Yes [name], MD  ferrous sulfate 325 (65 FE) MG tablet Take 1 tablet (325 mg total) by mouth 3 (three) times daily with meals. 08/19/18   Aline August, MD  guaiFENesin (MUCINEX) 600 MG 12 hr tablet Take 1 tablet (600 mg total) by mouth 2 (two) times daily. 08/19/18   Aline August, MD  oseltamivir (TAMIFLU) 30 MG capsule Take 1 capsule (30 mg total) by mouth 2 (two) times daily for 3 days. 08/19/18 08/22/18  Aline August, MD  pantoprazole (PROTONIX) 40 MG  tablet Take 1 tablet (40 mg total) by mouth daily. 08/19/18   Aline August, MD  predniSONE (DELTASONE) 20 MG tablet Take 2 tablets (40 mg total) by mouth daily with breakfast for 5 days. 08/20/18 08/25/18  Aline August, MD    Current Facility-Administered Medications  Medication Dose Route Frequency Provider Last Rate Last Dose  . acetaminophen (TYLENOL) tablet 650 mg  650 mg Oral Q6H PRN Karmen Bongo, MD   650 mg at 08/18/18 1217   Or  . acetaminophen (TYLENOL) suppository 650 mg  650 mg  Rectal Q6H PRN Karmen Bongo, MD      . amLODipine (NORVASC) tablet 10 mg  10 mg Oral Daily Karmen Bongo, MD   10 mg at 08/19/18 0930  . atorvastatin (LIPITOR) tablet 20 mg  20 mg Oral Daily Karmen Bongo, MD   20 mg at 08/19/18 0930  . ferrous sulfate tablet 325 mg  325 mg Oral TID WC Alekh, Kshitiz, MD      . guaiFENesin (MUCINEX) 12 hr tablet 600 mg  600 mg Oral BID [name], Rondell A, MD   600 mg at 08/19/18 0930  . insulin aspart (novoLOG) injection 0-20 Units  0-20 Units Subcutaneous TID WC Karmen Bongo, MD   11 Units at 08/19/18 1249  . insulin aspart (novoLOG) injection 0-5 Units  0-5 Units Subcutaneous QHS Karmen Bongo, MD   4 Units at 08/18/18 2310  . insulin glargine (LANTUS) injection 60 Units  60 Units Subcutaneous QHS Karmen Bongo, MD   60 Units at 08/17/18 2123  . ondansetron (ZOFRAN) tablet 4 mg  4 mg Oral Q6H PRN Karmen Bongo, MD       Or  . ondansetron [facility]) injection 4 mg  4 mg Intravenous Q6H PRN Karmen Bongo, MD      . oseltamivir (TAMIFLU) capsule 30 mg  30 mg Oral BID Karmen Bongo, MD   30 mg at 08/19/18 0930  . pantoprazole (PROTONIX) EC tablet 40 mg  40 mg Oral Daily Alekh, Kshitiz, MD      . predniSONE (DELTASONE) tablet 40 mg  40 mg Oral Q breakfast Starla Link, Kshitiz, MD   40 mg at 08/19/18 1109  . pregabalin (LYRICA) capsule 75 mg  75 mg Oral BID Karmen Bongo, MD   75 mg at 08/19/18 0930    Allergies as of 08/17/2018  . (No Known Allergies)    Family History  Problem Relation Age of Onset  . Colon cancer Mother   . Other Neg Hx   . Breast cancer Neg Hx       Review of Systems: As per HPI, otherwise negative  Physical Exam: Vital signs in last 24 hours: Temp:  [97.6 F (36.4 C)-98.5 F (36.9 C)] 98.5 F (36.9 C) (02/01 1408) Pulse Rate:  [71-87] 87 (02/01 1408) Resp:  [14-18] 18 (02/01 1408) BP: (153-173)/(82-102) 173/102 (02/01 1408) SpO2:  [88 %-99 %] 99 % (02/01 1408) Last BM Date: 08/17/18 Gen: awake, alert,  NAD HEENT: anicteric, op clear CV: RRR, no mrg Pulm: CTA b/l Abd: soft, NT/ND, +BS throughout Ext: no c/c/e Neuro: nonfocal   Intake/Output from previous day: No intake/output data recorded. Intake/Output this shift: Total I/O In: 488.3 [P.O.:240; I.V.:248.3] Out: -   Lab Results: Recent Labs    08/18/18 0516 08/18/18 0644 08/19/18 0647  WBC 3.6* 3.5* 3.8*  HGB 9.3* 9.6* 10.0*  HCT 30.7* 30.3* 32.0*  PLT 136* 125* 128*   BMET Recent Labs    08/17/18 3536 08/18/18 0516 08/19/18 1443  NA 139 143 136  K 4.2 4.2 4.7  CL 111 115* 109  CO2 18* 22 19*  GLUCOSE 278* 127* 338*  BUN 26* 25* 29*  CREATININE 1.95* 1.66* 1.56*  CALCIUM 8.0* 7.5* 8.0*   LFT Recent Labs    08/17/18 0907  PROT 7.0  ALBUMIN 3.2*  AST 24  ALT 13  ALKPHOS 60  BILITOT 0.8   Iron/TIBC/Ferritin/ %Sat    Component Value Date/Time   IRON 19 (L) 08/17/2018 1229   TIBC 287 08/17/2018 1229   FERRITIN 97 08/17/2018 1229   IRONPCTSAT 7 (L) 08/17/2018 1229     IMPRESSION:  58 y.o. female with a past medical history of hep C status post treatment with Dr. Linus Salmons in 2018, diabetes, hypertension, admitted with profound anemia and influenza (cough, malaise and fever requiring oxygen).  PLAN: 1.  Profound anemia, normocytic, heme-negative/family history of colon cancer --iron studies were mixed that her ferritin was normal.  Percent iron saturation is low.  She likely has an element of iron deficiency.  Either way I strongly have recommended upper endoscopy and colonoscopy.  Due to acute influenza, vigorous response to red cell transfusion and lack of overt bleeding I recommend this be deferred to the outpatient setting.  That said, I have stressed the importance to her to follow-up with [place] in our office soon and upper endoscopy and colonoscopy scheduled.  She is agreeable. I have asked the office to schedule her follow-up to arrange endo/colon.   Lajuan Lines Ginger Leeth  08/19/2018, 2:49 PM

## 2018-08-19 NOTE — Progress Notes (Addendum)
SATURATION QUALIFICATIONS: (This note is used to comply with regulatory documentation for home oxygen)  Patient Saturations on Room Air at Rest = 100%  Patient Saturations on Room Air while Ambulating = 100%  Please briefly explain why patient needs home oxygen: N/A

## 2018-08-30 ENCOUNTER — Ambulatory Visit: Payer: Medicaid Other | Admitting: Nurse Practitioner

## 2018-08-30 NOTE — Progress Notes (Signed)
Patient ID: [name] [name], female   DOB: [birth year], 58 y.o.   MRN: [id number]    Chrisha Vogel, is a 58 y.o. female  [id number]:[id number]  [id number]  DOB - [birth year]  Subjective:  Chief Complaint and HPI: [name] [name] is a 58 y.o. female here today to for a follow up visit After hospitalization 1/30-2/07/2018 for flu and Hgb 4.0.  She is feeling much better.  Finished tamiflu and prednisone.  She is taking the iron and the pantoprazole.  No N/V/D.  No f/c.  Cough resolved.  Has GI appt next week.    From discharge summary: Brief/Interim Summary: 58 year old female with history of hypertension, hyperlipidemia, diabetes mellitus type 2 and chronic pregnancy presented with cough, malaise and fever and anemia with initial hemoglobin of 4.  She was found to have influenza A positive and was started on Tamiflu.  She was transfused packed red cells.  Subsequently hemoglobin has remained stable.  GI has evaluated the patient and recommend outpatient evaluation and follow-up.  She will be discharged home on oral Tamiflu, prednisone and Protonix.  Discharge Diagnoses:  Principal Problem:   Symptomatic anemia Active Problems:   Essential hypertension   Type 2 diabetes mellitus (HCC)   Hyperlipidemia   Chronic hepatitis C without hepatic coma (HCC)   Influenza A   Acute renal failure (ARF) (HCC)  Influenza A bronchitis with hypoxia -Initially required oxygen via nasal cannula at 3 L/min.  Symptoms have improved.  Currently not spiking temperatures.  Started on Tamiflu. -Currently on room air and hypoxia has resolved.  Continue Tamiflu to finish 5-day course of treatment.  Discharge home on oral prednisone 40 mg daily for 5 days.  Acute on chronic anemia -Presented with hemoglobin of 4.  Stool guaiac was negative.  Status post 2 units packed red cells transfusion during the hospitalization.  Hemoglobin is 10 today.  Iron was 18 and iron saturation was 7.  Iron was 18 and iron saturation  was 7.  Will discharge on oral ferrous sulfate supplement along with oral Protonix. -GI has evaluated the patient and recommend outpatient follow-up.  Patient is to be compliant with outpatient follow-up  Diabetes mellitus type 2, uncontrolled with hyperglycemia -Hemoglobin A1c 9.2  -Continue outpatient regimen.  Outpatient follow-up with PCP  History of hepatitis C -Status post treatment as an outpatient.  Outpatient follow-up   Family history:  DM, htn, mom with colon CA   ROS:   Constitutional:  No f/c, No night sweats, No unexplained weight loss. EENT:  No vision changes, No blurry vision, No hearing changes. No mouth, throat, or ear problems.  Respiratory: No cough, No SOB Cardiac: No CP, no palpitations GI:  No abd pain, No N/V/D. GU: No Urinary s/sx Musculoskeletal: No joint pain Neuro: No headache, no dizziness, no motor weakness.  Skin: No rash Endocrine:  No polydipsia. No polyuria.  Psych: Denies SI/HI  No problems updated.  ALLERGIES: No Known Allergies  PAST MEDICAL HISTORY: Past Medical History:  Diagnosis Date  . Anemia   . Chronic hepatitis C without hepatic coma ([place]) 11/09/2016  . Diabetes mellitus   . Fibroids   . 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 Hypertension   . MRSA (methicillin resistant Staphylococcus aureus)   . Trichomonas   . VAGINITIS, BACTERIAL, RECURRENT 08/15/2007   Qualifier: Diagnosis of  By: Radene Ou MD, [place]: Prior to Admission medications   Medication Sig  Start Date End Date Taking? Authorizing Provider  albuterol (PROVENTIL HFA;VENTOLIN HFA) 108 (90 Base) MCG/ACT inhaler Inhale 2 puffs into the lungs every 6 (six) hours as needed for wheezing or shortness of breath. 03/15/18  Yes ,  M, PA-C  amLODipine (NORVASC) 10 MG tablet Take 1 tablet (10 mg total) by mouth daily. 06/23/18  Yes [name], MD  atorvastatin (LIPITOR) 20 MG tablet Take 1 tablet (20  mg total) by mouth daily. 06/23/18  Yes [name], MD  ferrous sulfate 325 (65 FE) MG tablet Take 1 tablet (325 mg total) by mouth 3 (three) times daily with meals. 08/19/18  Yes Aline August, MD  guaiFENesin (MUCINEX) 600 MG 12 hr tablet Take 1 tablet (600 mg total) by mouth 2 (two) times daily. 08/19/18  Yes Aline August, MD  Insulin Glargine (LANTUS SOLOSTAR) 100 UNIT/ML Solostar Pen Inject 60 Units into the skin at bedtime. 06/23/18  Yes Newlin, Charlane Ferretti, MD  liraglutide (VICTOZA) 18 MG/3ML SOPN Inject 0.3 mLs (1.8 mg total) into the skin daily with breakfast. 06/23/18  Yes Newlin, Enobong, MD  lisinopril-hydrochlorothiazide (PRINZIDE,ZESTORETIC) 20-25 MG tablet Take 1 tablet by mouth daily. 06/23/18  Yes [name], MD  pantoprazole (PROTONIX) 40 MG tablet Take 1 tablet (40 mg total) by mouth daily. 08/19/18  Yes Aline August, MD  pregabalin (LYRICA) 75 MG capsule Take 1 capsule (75 mg total) by mouth 2 (two) times daily. 06/23/18  Yes [name], MD     Objective:  EXAM:   Vitals:   08/31/18 0835  BP: (!) 146/97  Pulse: 96  Resp: 16  Temp: 98.7 F (37.1 C)  TempSrc: Oral  SpO2: 97%  Weight: 229 lb (103.9 kg)  Height: 5\' 6"  (1.676 m)    General appearance : A&OX3. NAD. Non-toxic-appearing HEENT: Atraumatic and Normocephalic.  PERRLA. EOM intact. Neck: supple, no JVD. No cervical lymphadenopathy. No thyromegaly Chest/Lungs:  Breathing-non-labored, Good air entry bilaterally, breath sounds normal without rales, rhonchi, or wheezing  CVS: S1 S2 regular, no murmurs, gallops, rubs  Extremities: Bilateral Lower Ext shows no edema, both legs are warm to touch with = pulse throughout Neurology:  CN II-XII grossly intact, Non focal.   Psych:  TP linear. J/I fair. Normal speech. Appropriate eye contact and blunted affect.  Skin:  No Rash  Data Review Lab Results  Component Value Date   HGBA1C 9.2 (A) 06/23/2018   HGBA1C 9.8 (A) 01/05/2018   HGBA1C 12.3 08/16/2017      Assessment & Plan   1. Iron deficiency anemia, unspecified iron deficiency anemia type Continue prescribed iron and increase water intake - CBC with Differential/Platelet  2. Type 2 diabetes mellitus with other neurologic complication, with long-term current use of insulin (HCC) Not at goal but improving, eliminate sugars/work on diet.  Continue current medication regimen - Glucose (CBG)  3. Hospital discharge follow-up Much improved  4. Influenza A resolved  Patient have been counseled extensively about nutrition and exercise  Return for appt with PCP in mArch.  The patient was given clear instructions to go to ER or return to medical center if symptoms don't improve, worsen or new problems develop. The patient verbalized understanding. The patient was told to call to get lab results if they haven't heard anything in the next week.     Freeman Caldron, PA-C [facility], [place]   08/31/2018, 8:57 AM

## 2018-08-31 ENCOUNTER — Ambulatory Visit: Payer: Medicaid Other | Attending: Family Medicine | Admitting: Physician Assistant

## 2018-08-31 VITALS — BP 146/97 | HR 96 | Temp 98.7°F | Resp 16 | Ht 66.0 in | Wt 229.0 lb

## 2018-08-31 DIAGNOSIS — Z833 Family history of diabetes mellitus: Secondary | ICD-10-CM | POA: Insufficient documentation

## 2018-08-31 DIAGNOSIS — I10 Essential (primary) hypertension: Secondary | ICD-10-CM | POA: Diagnosis not present

## 2018-08-31 DIAGNOSIS — Z794 Long term (current) use of insulin: Secondary | ICD-10-CM

## 2018-08-31 DIAGNOSIS — D509 Iron deficiency anemia, unspecified: Secondary | ICD-10-CM | POA: Insufficient documentation

## 2018-08-31 DIAGNOSIS — Z79899 Other long term (current) drug therapy: Secondary | ICD-10-CM | POA: Diagnosis not present

## 2018-08-31 DIAGNOSIS — E1149 Type 2 diabetes mellitus with other diabetic neurological complication: Secondary | ICD-10-CM | POA: Insufficient documentation

## 2018-08-31 DIAGNOSIS — N179 Acute kidney failure, unspecified: Secondary | ICD-10-CM | POA: Insufficient documentation

## 2018-08-31 DIAGNOSIS — J101 Influenza due to other identified influenza virus with other respiratory manifestations: Secondary | ICD-10-CM

## 2018-08-31 DIAGNOSIS — B182 Chronic viral hepatitis C: Secondary | ICD-10-CM | POA: Insufficient documentation

## 2018-08-31 DIAGNOSIS — Z8249 Family history of ischemic heart disease and other diseases of the circulatory system: Secondary | ICD-10-CM | POA: Insufficient documentation

## 2018-08-31 DIAGNOSIS — E1165 Type 2 diabetes mellitus with hyperglycemia: Secondary | ICD-10-CM | POA: Insufficient documentation

## 2018-08-31 DIAGNOSIS — Z09 Encounter for follow-up examination after completed treatment for conditions other than malignant neoplasm: Secondary | ICD-10-CM | POA: Diagnosis not present

## 2018-08-31 DIAGNOSIS — E785 Hyperlipidemia, unspecified: Secondary | ICD-10-CM | POA: Insufficient documentation

## 2018-08-31 LAB — GLUCOSE, POCT (MANUAL RESULT ENTRY): POC Glucose: 155 mg/dl — AB (ref 70–99)

## 2018-08-31 NOTE — Progress Notes (Signed)
Patient is here for HFU.

## 2018-08-31 NOTE — Patient Instructions (Signed)
Check blood sugar fasting and at bedtime.   Make sure and take your iron tablets and drink a lot of water.

## 2018-09-01 ENCOUNTER — Telehealth: Payer: Self-pay

## 2018-09-01 LAB — CBC WITH DIFFERENTIAL/PLATELET
Basophils Absolute: 0 10*3/uL (ref 0.0–0.2)
Basos: 0 %
EOS (ABSOLUTE): 0 10*3/uL (ref 0.0–0.4)
Eos: 0 %
Hematocrit: 35.3 % (ref 34.0–46.6)
Hemoglobin: 11.3 g/dL (ref 11.1–15.9)
IMMATURE GRANULOCYTES: 0 %
Immature Grans (Abs): 0 10*3/uL (ref 0.0–0.1)
Lymphocytes Absolute: 1.7 10*3/uL (ref 0.7–3.1)
Lymphs: 25 %
MCH: 27.1 pg (ref 26.6–33.0)
MCHC: 32 g/dL (ref 31.5–35.7)
MCV: 85 fL (ref 79–97)
Monocytes Absolute: 0.5 10*3/uL (ref 0.1–0.9)
Monocytes: 8 %
Neutrophils Absolute: 4.4 10*3/uL (ref 1.4–7.0)
Neutrophils: 67 %
Platelets: 208 10*3/uL (ref 150–450)
RBC: 4.17 x10E6/uL (ref 3.77–5.28)
RDW: 14.2 % (ref 11.7–15.4)
WBC: 6.6 10*3/uL (ref 3.4–10.8)

## 2018-09-01 NOTE — Telephone Encounter (Signed)
-----   Message from [place] Donovan, [place] sent at 09/01/2018  9:20 AM EST ----- Please call patient.  All labs normal.  Follow-up as planned.  Thanks, Freeman Caldron, PA-C

## 2018-09-01 NOTE — Telephone Encounter (Signed)
CMA spoke to patient to inform on results.  Pt. Verified DOB. Pt. Understood.  

## 2018-09-07 ENCOUNTER — Encounter: Payer: Self-pay | Admitting: Nurse Practitioner

## 2018-09-07 ENCOUNTER — Ambulatory Visit: Payer: Medicaid Other | Admitting: Nurse Practitioner

## 2018-09-07 VITALS — BP 168/100 | HR 100 | Ht 66.0 in | Wt 239.8 lb

## 2018-09-07 DIAGNOSIS — D649 Anemia, unspecified: Secondary | ICD-10-CM | POA: Diagnosis not present

## 2018-09-07 MED ORDER — NA SULFATE-K SULFATE-MG SULF 17.5-3.13-1.6 GM/177ML PO SOLN: ORAL | 0 refills | Status: DC

## 2018-09-07 NOTE — Patient Instructions (Addendum)
If you are age 58 or older, your body mass index should be between 23-30. Your Body mass index is 38.7 kg/m. If this is out of the aforementioned range listed, please consider follow up with your Primary Care Provider.  If you are age 26 or younger, your body mass index should be between 19-25. Your Body mass index is 38.7 kg/m. If this is out of the aformentioned range listed, please consider follow up with your Primary Care Provider.   You have been scheduled for an endoscopy and colonoscopy. Please follow the written instructions given to you at your visit today. Please pick up your prep supplies at the pharmacy within the next 1-3 days. If you use inhalers (even only as needed), please bring them with you on the day of your procedure. Your physician has requested that you go to www.startemmi.com and enter the access code given to you at your visit today. This web site gives a general overview about your procedure. However, you should still follow specific instructions given to you by our office regarding your preparation for the procedure.  We have sent the following medications to your pharmacy for you to pick up at your convenience: Bonnie. (Ferrous Sulfate)  HOLD IRON FIVE DAYS PRIOR TO PROCEDURE.  Thank you for choosing me and [facility].   Tye Savoy, NP

## 2018-09-07 NOTE — Progress Notes (Signed)
Chief Complaint:    Anemia  IMPRESSION and PLAN:    58 yo female recently hospitalized with profound [place] anemia (hbg 4) in absence of overt bleeding, Heme negative stools. Not likely iron deficient with pre-transfusion ferritin of 93.  She did have black stool around November, none since.  - Wonder whether hgb of 4 was accurate, it wasn't rechecked and hgb rose back to near baseilne of 9.3 with only two units of blood. On iron since discharge a few weeks ago and hgb now up to 11.3 .  -Suspect the recent hgb of 4 was inaccurate. The bigger question is why her baseline hgb changed several months ago from being ~ 12.3 to 9.4. -For further evaluation patient will be scheduled for EGD / colonoscopy. The risks and benefits of both procedures were discussed and the patient agrees to proceed.  -In the interim continue to avoid NSAIDS and continue PPI   HPI:     Patient is a 58 yo female with a history of hypertension, DM 2, HCV, chronic normocytic anemia, hx of colon polyps and family history of colon cancer in mother before the age of 68.  Patient is known to Dr. Henrene Pastor, she had a colonoscopy with polypectomy 02/01/2017. 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  A 3-year surveillance colonoscopy was recommended.   Shanen was recently hospitalized with profound anemia. Hgb in March 2019 was around baseline at 12.3, in late June and again in August it was 9.4 but on 08/17/18 she presented to the hospital with a hgb of 4. We saw her in consult, felt outpatient evaluation was appropriate. She wasn't having any overt bleeding at the time and was FOBT negative. She does admit to black stools for a couple of days around Thanksgiving. No associated abdominal pain, N/V. She denies any regular use of NSAIDS. No menses nearly 10 years. Stools dark on iron now but no further black stool.   Review of systems:     No chest pain, no SOB, no fevers, no  urinary sx   Past Medical History:  Diagnosis Date  . Anemia   . Chronic hepatitis C without hepatic coma ([place]) 11/09/2016  . Diabetes mellitus   . Fibroids   . 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 Hypertension   . MRSA (methicillin resistant Staphylococcus aureus)   . Stomach cancer ([place])   . Trichomonas   . VAGINITIS, BACTERIAL, RECURRENT 08/15/2007   Qualifier: Diagnosis of  By: Radene Ou MD, [place]      Patient's surgical history, family medical history, social history, medications and allergies were all reviewed in Epic   Serum creatinine: 1.56 mg/dL (H) 08/19/18 7425 Estimated creatinine clearance: 49.7 mL/min (A)  Current Outpatient Medications  Medication Sig Dispense Refill  . albuterol (PROVENTIL HFA;VENTOLIN HFA) 108 (90 Base) MCG/ACT inhaler Inhale 2 puffs into the lungs every 6 (six) hours as needed for wheezing or shortness of breath. 1 Inhaler 2  . amLODipine (NORVASC) 10 MG tablet Take 1 tablet (10 mg total) by mouth daily. 30 tablet 5  . atorvastatin (LIPITOR) 20 MG tablet Take 1 tablet (20 mg total) by mouth daily. 30 tablet 3  . ferrous sulfate 325 (65 FE) MG tablet Take 1 tablet (325 mg total) by mouth 3 (three) times daily with meals. 90 tablet 0  . guaiFENesin (MUCINEX) 600 MG 12 hr tablet Take  1 tablet (600 mg total) by mouth 2 (two) times daily. 20 tablet 0  . Insulin Glargine (LANTUS SOLOSTAR) 100 UNIT/ML Solostar Pen Inject 60 Units into the skin at bedtime. 5 pen 5  . liraglutide (VICTOZA) 18 MG/3ML SOPN Inject 0.3 mLs (1.8 mg total) into the skin daily with breakfast. 30 mL 3  . lisinopril-hydrochlorothiazide (PRINZIDE,ZESTORETIC) 20-25 MG tablet Take 1 tablet by mouth daily. 90 tablet 3  . pantoprazole (PROTONIX) 40 MG tablet Take 1 tablet (40 mg total) by mouth daily. 30 tablet 0  . pregabalin (LYRICA) 75 MG capsule Take 1 capsule (75 mg total) by mouth 2 (two) times daily. 60 capsule 3  . Na Sulfate-K Sulfate-Mg Sulf  17.5-3.13-1.6 GM/177ML SOLN Suprep-Use as directed 354 mL 0   No current facility-administered medications for this visit.     Physical Exam:     BP (!) 168/100   Pulse 100   Ht 5\' 6"  (1.676 m)   Wt 239 lb 12.8 oz (108.8 kg)   LMP 04/01/2010   BMI 38.70 kg/m   GENERAL:  Pleasant female in NAD PSYCH: : Cooperative, normal affect EENT:  conjunctiva pink, mucous membranes moist, neck supple without masses CARDIAC:  RRR, no peripheral edema PULM: Normal respiratory effort, lungs CTA bilaterally, no wheezing ABDOMEN:  Nondistended, soft, nontender. No obvious masses, no hepatomegaly,  normal bowel sounds SKIN:  turgor, no lesions seen Musculoskeletal:  Normal muscle tone, normal strength NEURO: Alert and oriented x 3, no focal neurologic deficits   Tye Savoy , NP 09/08/2018, 10:32  AM  Review of systems:     No chest pain, no SOB, no fevers, no urinary sx   Past Medical History:  Diagnosis Date  . Anemia   . Chronic hepatitis C without hepatic coma ([place]) 11/09/2016  . Diabetes mellitus   . Fibroids   . 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 Hypertension   . MRSA (methicillin resistant Staphylococcus aureus)   . Trichomonas   . VAGINITIS, BACTERIAL, RECURRENT 08/15/2007   Qualifier: Diagnosis of  By: Radene Ou MD, [place]      Patient's surgical history, family medical history, social history, medications and allergies were all reviewed in Epic

## 2018-09-08 ENCOUNTER — Ambulatory Visit: Payer: Medicaid Other | Admitting: Obstetrics and Gynecology

## 2018-09-08 ENCOUNTER — Encounter: Payer: Self-pay | Admitting: Nurse Practitioner

## 2018-09-08 NOTE — Progress Notes (Signed)
Assessment and plan reviewed 

## 2018-09-25 ENCOUNTER — Ambulatory Visit: Payer: Medicaid Other | Admitting: Family Medicine

## 2018-09-27 ENCOUNTER — Encounter: Payer: Self-pay | Admitting: Obstetrics and Gynecology

## 2018-09-27 ENCOUNTER — Other Ambulatory Visit: Payer: Self-pay

## 2018-09-27 ENCOUNTER — Ambulatory Visit (INDEPENDENT_AMBULATORY_CARE_PROVIDER_SITE_OTHER): Payer: Medicaid Other | Admitting: Obstetrics and Gynecology

## 2018-09-27 VITALS — BP 132/74 | HR 97 | Ht 65.0 in | Wt 238.7 lb

## 2018-09-27 DIAGNOSIS — N3020 Other chronic cystitis without hematuria: Secondary | ICD-10-CM

## 2018-09-27 DIAGNOSIS — N941 Unspecified dyspareunia: Secondary | ICD-10-CM | POA: Diagnosis not present

## 2018-09-27 NOTE — Patient Instructions (Signed)

## 2018-09-27 NOTE — Progress Notes (Signed)
[name] [name] is here for f/u of chronic cystitis.  She reports that her Sx have resolved. Still has occ vaginal dryness.  PE AF VSS Lungs clear Heart RRR Abd soft + BS  A/P Chronic cystitis, resolved        Dyspareunia/vaginal dryness  Discussed ERT, pt declined. [place] recommended to pt. F/U PRN

## 2018-10-02 ENCOUNTER — Telehealth: Payer: Self-pay | Admitting: *Deleted

## 2018-10-02 NOTE — Telephone Encounter (Signed)
Covid-19 travel screening questions  Have you traveled in the last 14 days? NO If yes where?  Do you now or have you had a fever in the last 14 days?  NO FEVERS  Do you have any respiratory symptoms of shortness of breath or cough now or in the last 14 days?  NO SYMPTOMS  Do you have a medical history of Congestive Heart Failure?  Do you have a medical history of lung disease?  Do you have any family members or close contacts with diagnosed or suspected Covid-19?  NO

## 2018-10-03 ENCOUNTER — Encounter: Payer: Self-pay | Admitting: Internal Medicine

## 2018-10-03 ENCOUNTER — Other Ambulatory Visit: Payer: Self-pay

## 2018-10-03 ENCOUNTER — Ambulatory Visit (AMBULATORY_SURGERY_CENTER): Payer: Medicaid Other | Admitting: Internal Medicine

## 2018-10-03 VITALS — BP 139/90 | HR 93 | Temp 97.1°F | Resp 11 | Ht 66.0 in | Wt 239.0 lb

## 2018-10-03 DIAGNOSIS — R195 Other fecal abnormalities: Secondary | ICD-10-CM | POA: Diagnosis not present

## 2018-10-03 DIAGNOSIS — Z800 Family history of malignant neoplasm of digestive organs: Secondary | ICD-10-CM | POA: Diagnosis not present

## 2018-10-03 DIAGNOSIS — I10 Essential (primary) hypertension: Secondary | ICD-10-CM | POA: Diagnosis not present

## 2018-10-03 DIAGNOSIS — D649 Anemia, unspecified: Secondary | ICD-10-CM | POA: Diagnosis not present

## 2018-10-03 DIAGNOSIS — Z86010 Personal history of colonic polyps: Secondary | ICD-10-CM

## 2018-10-03 DIAGNOSIS — E119 Type 2 diabetes mellitus without complications: Secondary | ICD-10-CM | POA: Diagnosis not present

## 2018-10-03 DIAGNOSIS — K219 Gastro-esophageal reflux disease without esophagitis: Secondary | ICD-10-CM | POA: Diagnosis not present

## 2018-10-03 MED ORDER — SODIUM CHLORIDE 0.9 % IV SOLN: 500.0000 mL | Freq: Once | INTRAVENOUS | Status: DC

## 2018-10-03 NOTE — Patient Instructions (Signed)
Continue Iron supplements, follow up with PCP for blood counts.  Follow up colonoscopy in 5 years.  YOU HAD AN ENDOSCOPIC PROCEDURE TODAY AT [facility]:   Refer to the procedure report that was given to you for any specific questions about what was found during the examination.  If the procedure report does not answer your questions, please call your gastroenterologist to clarify.  If you requested that your care partner not be given the details of your procedure findings, then the procedure report has been included in a sealed envelope for you to review at your convenience later.  YOU SHOULD EXPECT: Some feelings of bloating in the abdomen. Passage of more gas than usual.  Walking can help get rid of the air that was put into your GI tract during the procedure and reduce the bloating. If you had a lower endoscopy (such as a colonoscopy or flexible sigmoidoscopy) you may notice spotting of blood in your stool or on the toilet paper. If you underwent a bowel prep for your procedure, you may not have a normal bowel movement for a few days.  Please Note:  You might notice some irritation and congestion in your nose or some drainage.  This is from the oxygen used during your procedure.  There is no need for concern and it should clear up in a day or so.  SYMPTOMS TO REPORT IMMEDIATELY:   Following lower endoscopy (colonoscopy or flexible sigmoidoscopy):  Excessive amounts of blood in the stool  Significant tenderness or worsening of abdominal pains  Swelling of the abdomen that is new, acute  Fever of 100F or higher   Following upper endoscopy (EGD)  Vomiting of blood or coffee ground material  New chest pain or pain under the shoulder blades  Painful or persistently difficult swallowing  New shortness of breath  Fever of 100F or higher  Black, tarry-looking stools  For urgent or emergent issues, a gastroenterologist can be reached at any hour by calling [phone]  [phone].   DIET:  We do recommend a small meal at first, but then you may proceed to your regular diet.  Drink plenty of fluids but you should avoid alcoholic beverages for 24 hours.  ACTIVITY:  You should plan to take it easy for the rest of today and you should NOT DRIVE or use heavy machinery until tomorrow (because of the sedation medicines used during the test).    FOLLOW UP: Our staff will call the number listed on your records the next business day following your procedure to check on you and address any questions or concerns that you may have regarding the information given to you following your procedure. If we do not reach you, we will leave a message.  However, if you are feeling well and you are not experiencing any problems, there is no need to return our call.  We will assume that you have returned to your regular daily activities without incident.  If any biopsies were taken you will be contacted by phone or by letter within the next 1-3 weeks.  Please call us at [phone] if you have not heard about the biopsies in 3 weeks.    SIGNATURES/CONFIDENTIALITY: You and/or your care partner have signed paperwork which will be entered into your electronic medical record.  These signatures attest to the fact that that the information above on your After Visit Summary has been reviewed and is understood.  Full responsibility of the confidentiality of this discharge information  lies with you and/or your care-partner.

## 2018-10-03 NOTE — Op Note (Signed)
[place] Patient Name: [name] [name] Procedure Date: 10/03/2018 2:55 PM MRN: [id number] Endoscopist: Docia Chuck. Henrene Pastor , MD Age: 58 Referring MD:  Date of Birth: May 22, 1961 Gender: Female Account #: [id number] Procedure:                Upper GI endoscopy Indications:              Normocytic anemia and heme negative stool. Was                            given iron supplementation with normalization of                            blood counts. Did report transient dark stools                            remotely. Now for evaluation to rule out any                            significant upper GI pathology Medicines:                Monitored Anesthesia Care Procedure:                Pre-Anesthesia Assessment:                           - Prior to the procedure, a History and Physical                            was performed, and patient medications and                            allergies were reviewed. The patient's tolerance of                            previous anesthesia was also reviewed. The risks                            and benefits of the procedure and the sedation                            options and risks were discussed with the patient.                            All questions were answered, and informed consent                            was obtained. Prior Anticoagulants: The patient has                            taken no previous anticoagulant or antiplatelet                            agents. ASA Grade Assessment: II - A patient with  mild systemic disease. After reviewing the risks                            and benefits, the patient was deemed in                            satisfactory condition to undergo the procedure.                           After obtaining informed consent, the endoscope was                            passed under direct vision. Throughout the                            procedure, the patient's blood pressure,  pulse, and                            oxygen saturations were monitored continuously. The                            Model GIF-HQ190 [id number]) scope was introduced                            through the mouth, and advanced to the second part                            of duodenum. The upper GI endoscopy was                            accomplished without difficulty. The patient                            tolerated the procedure well. Scope In: Scope Out: Findings:                 The esophagus was normal.                           The stomach was normal.                           The examined duodenum was normal.                           The cardia and gastric fundus were normal on                            retroflexion. Complications:            No immediate complications. Estimated Blood Loss:     Estimated blood loss: none. Impression:               - Normal esophagus.                           - Normal stomach.                           -  Normal examined duodenum.                           - No specimens collected. Recommendation:           1. Continue iron supplement once daily                           2. Colonoscopy today. Please see report                           3. Follow-up with your primary care provider who                            can monitor your blood counts periodically as                            needed. Docia Chuck. Henrene Pastor, MD 10/03/2018 3:23:44 PM This report has been signed electronically.

## 2018-10-03 NOTE — Progress Notes (Signed)
Report to PACU, RN, vss, BBS= Clear.  

## 2018-10-03 NOTE — Op Note (Signed)
[place] Patient Name: [name] [name] Procedure Date: 10/03/2018 2:55 PM MRN: [id number] Endoscopist: Docia Chuck. Henrene Pastor , MD Age: 58 Referring MD:  Date of Birth: Dec 16, 1960 Gender: Female Account #: [id number] Procedure:                Colonoscopy Indications:              Normocytic anemia. Heme-negative stool. Not clearly                            iron deficiency but responded to iron. Family                            history of colon cancer in mother less than 53.                            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 The patient's tolerance of                            previous anesthesia was also reviewed. The risks                            and benefits of the procedure and the sedation                            options and risks were discussed with the patient.                            All questions were answered, and informed consent                            was obtained. Prior Anticoagulants: The patient has                            taken no previous anticoagulant or antiplatelet                            agents. ASA Grade Assessment: II - A patient with  mild systemic disease. After reviewing the risks                            and benefits, the patient was deemed in                            satisfactory condition to undergo the procedure.                           After obtaining informed consent, the colonoscope                            was passed under direct vision. Throughout the                            procedure, the patient's blood pressure, pulse,  and                            oxygen saturations were monitored continuously. The                            Colonoscope was introduced through the anus and                            advanced to the the cecum, identified by                            appendiceal orifice and ileocecal valve. The                            ileocecal valve, appendiceal orifice, and rectum                            were photographed. The quality of the bowel                            preparation was adequate. The colonoscopy was                            performed without difficulty. The patient tolerated                            the procedure well. The bowel preparation used was                            SUPREP. Scope In: 3:26:14 PM Scope Out: 3:44:28 PM Scope Withdrawal Time: 0 hours 10 minutes 6 seconds  Total Procedure Duration: 0 hours 18 minutes 14 seconds  Findings:                 The entire examined colon appeared normal on direct                            and retroflexion views. Complications:  No immediate complications. Estimated blood loss:                            None. Estimated Blood Loss:     Estimated blood loss: none. Impression:               - The entire examined colon is normal on direct and                            retroflexion views.                           - No specimens collected. Recommendation:           - Repeat colonoscopy in 5 years for surveillance.                           - Patient has a contact number available for                            emergencies. The signs and symptoms of potential                            delayed complications were discussed with the                            patient. Return to normal activities tomorrow.                            Written discharge instructions were provided to the                            patient.                           - Resume previous diet.                           - Continue present  medications.                           - Return to the care of your primary provider Docia Chuck. Henrene Pastor, MD 10/03/2018 3:51:32 PM This report has been signed electronically.

## 2018-10-04 ENCOUNTER — Telehealth: Payer: Self-pay | Admitting: *Deleted

## 2018-10-04 ENCOUNTER — Telehealth: Payer: Self-pay

## 2018-10-04 NOTE — Telephone Encounter (Signed)
No answer, left message to call back later today, B.Selam Pietsch RN. 

## 2018-10-04 NOTE — Telephone Encounter (Signed)
Second attempt, left VM.  

## 2018-10-12 ENCOUNTER — Telehealth: Payer: Self-pay | Admitting: Family Medicine

## 2018-10-12 NOTE — Telephone Encounter (Signed)
Will route to PCP for review. 

## 2018-10-12 NOTE — Telephone Encounter (Signed)
Patient called because her insurance is not covering her victoza and her lyrica and was told to FU with PCP to see what could be done. Please follow up.

## 2018-10-13 NOTE — Telephone Encounter (Signed)
Could you please look into this and advise.  She failed Byetta and gabapentin in the past. Thank you

## 2018-10-16 NOTE — Telephone Encounter (Signed)
I did not receive a PA for this patient but both of these should be approved easily once I submit the PA. I will notify the patient when the PA's are approved.

## 2018-10-17 NOTE — Telephone Encounter (Signed)
Both meds are approved through 10/17/18

## 2018-10-26 NOTE — Telephone Encounter (Signed)
1) Medication(s) Requested (by name): lyrica Patient states her prescription is not at the pharmacy please follow up 2) Pharmacy of Choice: [facility] 3) Special Requests:   Approved medications will be sent to the pharmacy, we will reach out if there is an issue.  Requests made after 3pm may not be addressed until the following business day!  If a patient is unsure of the name of the medication(s) please note and ask patient to call back when they are able to provide all info, do not send to responsible party until all information is available!

## 2018-10-30 NOTE — Telephone Encounter (Signed)
[facility] was notified of PA's submitted and resubmitted claims for both Victoza and Lyrica, they are getting them ready now.   Left VM to notify pt.

## 2018-11-13 ENCOUNTER — Ambulatory Visit: Payer: Medicaid Other | Admitting: Family Medicine

## 2019-01-22 ENCOUNTER — Other Ambulatory Visit: Payer: Self-pay | Admitting: Family Medicine

## 2019-01-22 DIAGNOSIS — Z794 Long term (current) use of insulin: Secondary | ICD-10-CM

## 2019-01-22 DIAGNOSIS — E1149 Type 2 diabetes mellitus with other diabetic neurological complication: Secondary | ICD-10-CM

## 2019-01-23 ENCOUNTER — Telehealth: Payer: Self-pay | Admitting: Family Medicine

## 2019-01-23 NOTE — Telephone Encounter (Signed)
1) Medication(s) Requested (by name): -pregabalin (LYRICA) 75 MG capsule   2) Pharmacy of Choice: -[facility] ([place] [place] - [address] 3) Special Requests:   Approved medications will be sent to the pharmacy, we will reach out if there is an issue.  Requests made after 3pm may not be addressed until the following business day!  If a patient is unsure of the name of the medication(s) please note and ask patient to call back when they are able to provide all info, do not send to responsible party until all information is available!

## 2019-01-25 NOTE — Telephone Encounter (Signed)
RX was sent to pharmacy on 01/23/19

## 2019-01-29 ENCOUNTER — Other Ambulatory Visit: Payer: Self-pay

## 2019-01-29 ENCOUNTER — Ambulatory Visit: Payer: Medicaid Other | Attending: Family Medicine | Admitting: Family Medicine

## 2019-01-29 ENCOUNTER — Encounter: Payer: Self-pay | Admitting: Family Medicine

## 2019-01-29 VITALS — BP 153/85 | HR 85 | Temp 97.5°F | Ht 65.0 in | Wt 238.0 lb

## 2019-01-29 DIAGNOSIS — R0609 Other forms of dyspnea: Secondary | ICD-10-CM

## 2019-01-29 DIAGNOSIS — E1149 Type 2 diabetes mellitus with other diabetic neurological complication: Secondary | ICD-10-CM

## 2019-01-29 DIAGNOSIS — E11311 Type 2 diabetes mellitus with unspecified diabetic retinopathy with macular edema: Secondary | ICD-10-CM

## 2019-01-29 DIAGNOSIS — Z1239 Encounter for other screening for malignant neoplasm of breast: Secondary | ICD-10-CM | POA: Diagnosis not present

## 2019-01-29 DIAGNOSIS — R21 Rash and other nonspecific skin eruption: Secondary | ICD-10-CM | POA: Diagnosis not present

## 2019-01-29 DIAGNOSIS — Z794 Long term (current) use of insulin: Secondary | ICD-10-CM

## 2019-01-29 DIAGNOSIS — I10 Essential (primary) hypertension: Secondary | ICD-10-CM | POA: Diagnosis not present

## 2019-01-29 LAB — POCT GLYCOSYLATED HEMOGLOBIN (HGB A1C): HbA1c, POC (controlled diabetic range): 8.1 % — AB (ref 0.0–7.0)

## 2019-01-29 LAB — GLUCOSE, POCT (MANUAL RESULT ENTRY): POC Glucose: 189 mg/dl — AB (ref 70–99)

## 2019-01-29 MED ORDER — ATORVASTATIN CALCIUM 40 MG PO TABS: 40.0000 mg | ORAL_TABLET | Freq: Every day | ORAL | 6 refills | Status: DC

## 2019-01-29 MED ORDER — LANTUS SOLOSTAR 100 UNIT/ML ~~LOC~~ SOPN: 65.0000 [IU] | PEN_INJECTOR | Freq: Every day | SUBCUTANEOUS | 5 refills | Status: DC

## 2019-01-29 MED ORDER — VICTOZA 18 MG/3ML ~~LOC~~ SOPN: 1.8000 mg | PEN_INJECTOR | Freq: Every day | SUBCUTANEOUS | 6 refills | Status: DC

## 2019-01-29 MED ORDER — LISINOPRIL-HYDROCHLOROTHIAZIDE 20-25 MG PO TABS: 1.0000 | ORAL_TABLET | Freq: Every day | ORAL | 1 refills | Status: DC

## 2019-01-29 MED ORDER — AMLODIPINE BESYLATE 10 MG PO TABS: 10.0000 mg | ORAL_TABLET | Freq: Every day | ORAL | 5 refills | Status: DC

## 2019-01-29 MED ORDER — DIPHENOXYLATE-ATROPINE 2.5-0.025 MG PO TABS: 1.0000 | ORAL_TABLET | Freq: Four times a day (QID) | ORAL | 1 refills | Status: DC | PRN

## 2019-01-29 MED ORDER — ALBUTEROL SULFATE HFA 108 (90 BASE) MCG/ACT IN AERS: 2.0000 | INHALATION_SPRAY | Freq: Four times a day (QID) | RESPIRATORY_TRACT | 3 refills | Status: DC | PRN

## 2019-01-29 MED ORDER — LYRICA 75 MG PO CAPS: 75.0000 mg | ORAL_CAPSULE | Freq: Two times a day (BID) | ORAL | 6 refills | Status: DC

## 2019-01-29 MED ORDER — TRIAMCINOLONE ACETONIDE 0.1 % EX CREA: 1.0000 "application " | TOPICAL_CREAM | Freq: Two times a day (BID) | CUTANEOUS | 1 refills | Status: DC

## 2019-01-29 NOTE — Progress Notes (Signed)
Patient is fasting and has not taken morning medications.  Patient states that she is concerned about the color change of her legs.

## 2019-01-29 NOTE — Progress Notes (Signed)
Subjective:  Patient ID: [name] [name], female    DOB: 1961-05-02  Age: 58 y.o. MRN: [id number]  CC: Diabetes   HPI [name] [name] is a 58 year old female with a history of type 2 diabetes mellitus (A1c 8.1), diabetic neuropathy hypertension, diabetic retinopathy, hyperlipidemia, hepatitis C (completed treatment with harvoni) who presents today for  a follow up visit. Her A1c is 8.1 which is down from 9.2 previously and she denies hypoglycemia or visual concerns.  Her neuropathy has worsened as she has been out of [place]. She is concerned about the color of her legs as she has noticed alternating hypopigmentation with normal skin but denies itching of her legs. She also complains of dyspnea on mild exertion and feels she has to sit for a bit even after climbing her stairs.  She has not gained any weight recently.  Denies pedal edema, chest pains or orthopnea. The diarrhea which she previously had has returned she states and she feels it could be related to her foods as this is frequent when she eats soups.  She is requesting a prescription for Lomotil. Her blood pressure is elevated and she is yet to take antihypertensive today.  Past Medical History:  Diagnosis Date  . Anemia   . Chronic hepatitis C without hepatic coma ([place]) 11/09/2016  . Diabetes mellitus   . Fibroids   . 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 Hypertension   . MRSA (methicillin resistant Staphylococcus aureus)   . Stomach cancer ([place])   . Trichomonas   . VAGINITIS, BACTERIAL, RECURRENT 08/15/2007   Qualifier: Diagnosis of  By: Radene Ou MD, [place]      Past Surgical History:  Procedure Laterality Date  . CESAREAN SECTION     breech    Family History  Problem Relation Age of Onset  . Colon cancer Mother   . Liver disease Sister   . Other Neg Hx   . Breast cancer Neg Hx   . Esophageal cancer Neg Hx   . Rectal cancer Neg Hx     No Known Allergies  Outpatient Medications  Prior to Visit  Medication Sig Dispense Refill  . ferrous sulfate 325 (65 FE) MG tablet Take 1 tablet (325 mg total) by mouth 3 (three) times daily with meals. 90 tablet 0  . pantoprazole (PROTONIX) 40 MG tablet Take 1 tablet (40 mg total) by mouth daily. 30 tablet 0  . albuterol (PROVENTIL HFA;VENTOLIN HFA) 108 (90 Base) MCG/ACT inhaler Inhale 2 puffs into the lungs every 6 (six) hours as needed for wheezing or shortness of breath. 1 Inhaler 2  . amLODipine (NORVASC) 10 MG tablet Take 1 tablet (10 mg total) by mouth daily. 30 tablet 5  . atorvastatin (LIPITOR) 20 MG tablet Take 1 tablet (20 mg total) by mouth daily. 30 tablet 3  . Insulin Glargine (LANTUS SOLOSTAR) 100 UNIT/ML Solostar Pen Inject 60 Units into the skin at bedtime. 5 pen 5  . liraglutide (VICTOZA) 18 MG/3ML SOPN Inject 0.3 mLs (1.8 mg total) into the skin daily with breakfast. 30 mL 3  . lisinopril-hydrochlorothiazide (PRINZIDE,ZESTORETIC) 20-25 MG tablet Take 1 tablet by mouth daily. 90 tablet 3  . LYRICA 75 MG capsule Take 1 capsule by mouth twice daily 60 capsule 0   No facility-administered medications prior to visit.      ROS Review of Systems  Constitutional: Negative for activity change, appetite change and fatigue.  HENT: Negative for congestion, sinus pressure and  sore throat.   Eyes: Negative for visual disturbance.  Respiratory: Negative for cough, chest tightness, shortness of breath and wheezing.   Cardiovascular: Negative for chest pain and palpitations.  Gastrointestinal: Positive for diarrhea. Negative for abdominal distention, abdominal pain and constipation.  Endocrine: Negative for polydipsia.  Genitourinary: Negative for dysuria and frequency.  Musculoskeletal: Negative for arthralgias and back pain.  Skin: Positive for rash.  Neurological: Negative for tremors, light-headedness and numbness.  Hematological: Does not bruise/bleed easily.  Psychiatric/Behavioral: Negative for agitation and behavioral  problems.    Objective:  BP (!) 153/85   Pulse 85   Temp (!) 97.5 F (36.4 C) (Oral)   Ht '5\' 5"'  (1.651 m)   Wt 238 lb (108 kg)   LMP 04/01/2010   SpO2 99%   BMI 39.61 kg/m   BP/Weight 01/29/2019 10/03/2018 3/70/4888  Systolic BP 916 945 038  Diastolic BP 85 90 74  Wt. (Lbs) 238 239 238.7  BMI 39.61 38.58 39.72      Physical Exam Constitutional:      Appearance: She is well-developed. She is obese.  Cardiovascular:     Rate and Rhythm: Normal rate.     Heart sounds: Normal heart sounds. No murmur.  Pulmonary:     Effort: Pulmonary effort is normal.     Breath sounds: Normal breath sounds. No wheezing or rales.  Chest:     Chest wall: No tenderness.  Abdominal:     General: Bowel sounds are normal. There is no distension.     Palpations: Abdomen is soft. There is no mass.     Tenderness: There is no abdominal tenderness.  Musculoskeletal: Normal range of motion.  Skin:    Comments: Bilateral shin with hypopigmented and hyperemic lesions, absence of scaling ulceration  Neurological:     Mental Status: She is alert and oriented to person, place, and time.  Psychiatric:        Mood and Affect: Mood normal.     CMP Latest Ref Rng & Units 08/19/2018 08/18/2018 08/17/2018  Glucose 70 - 99 mg/dL 338(H) 127(H) 278(H)  BUN 6 - 20 mg/dL 29(H) 25(H) 26(H)  Creatinine 0.44 - 1.00 mg/dL 1.56(H) 1.66(H) 1.95(H)  Sodium 135 - 145 mmol/L 136 143 139  Potassium 3.5 - 5.1 mmol/L 4.7 4.2 4.2  Chloride 98 - 111 mmol/L 109 115(H) 111  CO2 22 - 32 mmol/L 19(L) 22 18(L)  Calcium 8.9 - 10.3 mg/dL 8.0(L) 7.5(L) 8.0(L)  Total Protein 6.5 - 8.1 g/dL - - 7.0  Total Bilirubin 0.3 - 1.2 mg/dL - - 0.8  Alkaline Phos 38 - 126 U/L - - 60  AST 15 - 41 U/L - - 24  ALT 0 - 44 U/L - - 13    Lipid Panel     Component Value Date/Time   CHOL 158 08/16/2017 1214   TRIG 93 08/16/2017 1214   HDL 65 08/16/2017 1214   CHOLHDL 2.4 08/16/2017 1214   CHOLHDL 3 06/18/2014 0951   VLDL 19.6 06/18/2014  0951   LDLCALC 74 08/16/2017 1214    CBC    Component Value Date/Time   WBC 6.6 08/31/2018 0925   WBC 3.8 (L) 08/19/2018 0647   RBC 4.17 08/31/2018 0925   RBC 3.85 (L) 08/19/2018 0647   HGB 11.3 08/31/2018 0925   HCT 35.3 08/31/2018 0925   PLT 208 08/31/2018 0925   MCV 85 08/31/2018 0925   MCH 27.1 08/31/2018 0925   MCH 26.0 08/19/2018 0647   MCHC 32.0  08/31/2018 0925   MCHC 31.3 08/19/2018 0647   RDW 14.2 08/31/2018 0925   LYMPHSABS 1.7 08/31/2018 0925   MONOABS 0.3 08/18/2018 0644   EOSABS 0.0 08/31/2018 0925   BASOSABS 0.0 08/31/2018 0925    Lab Results  Component Value Date   HGBA1C 8.1 (A) 01/29/2019    Assessment & Plan:   1. 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  Dispense: 5 pen; Refill: 5 - liraglutide (VICTOZA) 18 MG/3ML SOPN; Inject 0.3 mLs (1.8 mg total) into the skin daily with breakfast.  Dispense: 30 mL; Refill: 6  2. Type 2 diabetes mellitus with other neurologic complication, with long-term current use of insulin ([facility]) Commended on improvement of A1c which is 8.1 and has trended down from 9.2 previously This is above her goal of less than 7 Increase Lantus to 65 units at bedtime Counseled on Diabetic diet, my plate method, 975 minutes of moderate intensity exercise/week Keep blood sugar logs with fasting goals of 80-120 mg/dl, random of less than 180 and in the event of sugars less than 60 mg/dl or greater than 400 mg/dl please notify the clinic ASAP. It is recommended that you undergo annual eye exams and annual foot exams. Pneumonia vaccine is recommended.  - Lipid panel - CMP14+EGFR - atorvastatin (LIPITOR) 40 MG tablet; Take 1 tablet (40 mg total) by mouth daily.  Dispense: 30 tablet; Refill: 6 - Insulin Glargine (LANTUS SOLOSTAR) 100 UNIT/ML Solostar Pen; Inject 65 Units into the skin at bedtime.  Dispense: 5 pen; Refill: 5 - LYRICA 75 MG capsule; Take 1 capsule (75 mg total) by mouth 2 (two) times daily.  Dispense: 60 capsule; Refill: 6 - Microalbumin/Creatinine Ratio, Urine  3. Essential hypertension Uncontrolled Yet to take medications this morning as she is fasting in anticipation of labs Counseled on blood pressure goal of less than 130/80, low-sodium, DASH diet, medication compliance, 150 minutes of moderate intensity exercise per week. Discussed medication compliance, adverse effects. - amLODipine (NORVASC) 10 MG tablet; Take 1 tablet (10 mg total) by mouth daily.  Dispense: 30 tablet; Refill: 5 - lisinopril-hydrochlorothiazide (ZESTORETIC) 20-25 MG tablet; Take 1 tablet by mouth daily.  Dispense: 90 tablet; Refill: 1  4. Other form of dyspnea Could be secondary to weight We will perform work-up to exclude diastolic heart failure - Brain natriuretic peptide - albuterol (VENTOLIN HFA) 108 (90 Base) MCG/ACT inhaler; Inhale 2 puffs into the lungs every 6 (six) hours as needed for wheezing or shortness of breath.  Dispense: 18 g; Refill: 3 - ECHOCARDIOGRAM COMPLETE; Future  5. Rash Unknown etiology Could be secondary to diabetic dermatitis - triamcinolone cream (KENALOG) 0.1 %; Apply 1 application topically 2 (two) times daily.  Dispense: 30 g; Refill: 1  6. Screening for breast cancer - MM Digital Screening; Future   Health Care Maintenance: Mammogram has been ordered Meds ordered this encounter  Medications  . atorvastatin (LIPITOR) 40 MG tablet    Sig: Take 1 tablet (40 mg total) by mouth daily.    Dispense:  30 tablet    Refill:  6  . Insulin Glargine (LANTUS SOLOSTAR) 100 UNIT/ML Solostar Pen  Sig: Inject 65 Units  into the skin at bedtime.    Dispense:  5 pen    Refill:  5    Discontinue previous dose  . diphenoxylate-atropine (LOMOTIL) 2.5-0.025 MG tablet    Sig: Take 1 tablet by mouth 4 (four) times daily as needed for diarrhea or loose stools.    Dispense:  30 tablet    Refill:  1  . albuterol (VENTOLIN HFA) 108 (90 Base) MCG/ACT inhaler    Sig: Inhale 2 puffs into the lungs every 6 (six) hours as needed for wheezing or shortness of breath.    Dispense:  18 g    Refill:  3  . amLODipine (NORVASC) 10 MG tablet    Sig: Take 1 tablet (10 mg total) by mouth daily.    Dispense:  30 tablet    Refill:  5  . liraglutide (VICTOZA) 18 MG/3ML SOPN    Sig: Inject 0.3 mLs (1.8 mg total) into the skin daily with breakfast.    Dispense:  30 mL    Refill:  6  . lisinopril-hydrochlorothiazide (ZESTORETIC) 20-25 MG tablet    Sig: Take 1 tablet by mouth daily.    Dispense:  90 tablet    Refill:  1  . LYRICA 75 MG capsule    Sig: Take 1 capsule (75 mg total) by mouth 2 (two) times daily.    Dispense:  60 capsule    Refill:  6  . triamcinolone cream (KENALOG) 0.1 %    Sig: Apply 1 application topically 2 (two) times daily.    Dispense:  30 g    Refill:  1    Follow-up: Return in about 3 months (around 05/01/2019) for Chronic medical conditions.       [name], MD, FAAFP. [facility], [place]   01/29/2019, 11:05 AM

## 2019-01-29 NOTE — Patient Instructions (Signed)
° °Calorie Counting for Weight Loss °Calories are units of energy. Your body needs a certain amount of calories from food to keep you going throughout the day. When you eat more calories than your body needs, your body stores the extra calories as fat. When you eat fewer calories than your body needs, your body burns fat to get the energy it needs. °Calorie counting means keeping track of how many calories you eat and drink each day. Calorie counting can be helpful if you need to lose weight. If you make sure to eat fewer calories than your body needs, you should lose weight. Ask your health care provider what a healthy weight is for you. °For calorie counting to work, you will need to eat the right number of calories in a day in order to lose a healthy amount of weight per week. A dietitian can help you determine how many calories you need in a day and will give you suggestions on how to reach your calorie goal. °· A healthy amount of weight to lose per week is usually 1-2 lb (0.5-0.9 kg). This usually means that your daily calorie intake should be reduced by 500-750 calories. °· Eating 1,200 - 1,500 calories per day can help most women lose weight. °· Eating 1,500 - 1,800 calories per day can help most men lose weight. °What is my plan? °My goal is to have __________ calories per day. °If I have this many calories per day, I should lose around __________ pounds per week. °What do I need to know about calorie counting? °In order to meet your daily calorie goal, you will need to: °· Find out how many calories are in each food you would like to eat. Try to do this before you eat. °· Decide how much of the food you plan to eat. °· Write down what you ate and how many calories it had. Doing this is called keeping a food log. °To successfully lose weight, it is important to balance calorie counting with a healthy lifestyle that includes regular activity. Aim for 150 minutes of moderate exercise (such as walking) or 75  minutes of vigorous exercise (such as running) each week. °Where do I find calorie information? ° °The number of calories in a food can be found on a Nutrition Facts label. If a food does not have a Nutrition Facts label, try to look up the calories online or ask your dietitian for help. °Remember that calories are listed per serving. If you choose to have more than one serving of a food, you will have to multiply the calories per serving by the amount of servings you plan to eat. For example, the label on a package of bread might say that a serving size is 1 slice and that there are 90 calories in a serving. If you eat 1 slice, you will have eaten 90 calories. If you eat 2 slices, you will have eaten 180 calories. °How do I keep a food log? °Immediately after each meal, record the following information in your food log: °· What you ate. Don't forget to include toppings, sauces, and other extras on the food. °· How much you ate. This can be measured in cups, ounces, or number of items. °· How many calories each food and drink had. °· The total number of calories in the meal. °Keep your food log near you, such as in a small notebook in your pocket, or use a mobile app or website. Some programs will   calculate calories for you and show you how many calories you have left for the day to meet your goal. °What are some calorie counting tips? ° °· Use your calories on foods and drinks that will fill you up and not leave you hungry: °? Some examples of foods that fill you up are nuts and nut butters, vegetables, lean proteins, and high-fiber foods like whole grains. High-fiber foods are foods with more than 5 g fiber per serving. °? Drinks such as sodas, specialty coffee drinks, alcohol, and juices have a lot of calories, yet do not fill you up. °· Eat nutritious foods and avoid empty calories. Empty calories are calories you get from foods or beverages that do not have many vitamins or protein, such as candy, sweets, and  soda. It is better to have a nutritious high-calorie food (such as an avocado) than a food with few nutrients (such as a bag of chips). °· Know how many calories are in the foods you eat most often. This will help you calculate calorie counts faster. °· Pay attention to calories in drinks. Low-calorie drinks include water and unsweetened drinks. °· Pay attention to nutrition labels for "low fat" or "fat free" foods. These foods sometimes have the same amount of calories or more calories than the full fat versions. They also often have added sugar, starch, or salt, to make up for flavor that was removed with the fat. °· Find a way of tracking calories that works for you. Get creative. Try different apps or programs if writing down calories does not work for you. °What are some portion control tips? °· Know how many calories are in a serving. This will help you know how many servings of a certain food you can have. °· Use a measuring cup to measure serving sizes. You could also try weighing out portions on a kitchen scale. With time, you will be able to estimate serving sizes for some foods. °· Take some time to put servings of different foods on your favorite plates, bowls, and cups so you know what a serving looks like. °· Try not to eat straight from a bag or box. Doing this can lead to overeating. Put the amount you would like to eat in a cup or on a plate to make sure you are eating the right portion. °· Use smaller plates, glasses, and bowls to prevent overeating. °· Try not to multitask (for example, watch TV or use your computer) while eating. If it is time to eat, sit down at a table and enjoy your food. This will help you to know when you are full. It will also help you to be aware of what you are eating and how much you are eating. °What are tips for following this plan? °Reading food labels °· Check the calorie count compared to the serving size. The serving size may be smaller than what you are used to  eating. °· Check the source of the calories. Make sure the food you are eating is high in vitamins and protein and low in saturated and trans fats. °Shopping °· Read nutrition labels while you shop. This will help you make healthy decisions before you decide to purchase your food. °· Make a grocery list and stick to it. °Cooking °· Try to cook your favorite foods in a healthier way. For example, try baking instead of frying. °· Use low-fat dairy products. °Meal planning °· Use more fruits and vegetables. Half of your plate should be   fruits and vegetables. °· Include lean proteins like poultry and fish. °How do I count calories when eating out? °· Ask for smaller portion sizes. °· Consider sharing an entree and sides instead of getting your own entree. °· If you get your own entree, eat only half. Ask for a box at the beginning of your meal and put the rest of your entree in it so you are not tempted to eat it. °· If calories are listed on the menu, choose the lower calorie options. °· Choose dishes that include vegetables, fruits, whole grains, low-fat dairy products, and lean protein. °· Choose items that are boiled, broiled, grilled, or steamed. Stay away from items that are buttered, battered, fried, or served with cream sauce. Items labeled "crispy" are usually fried, unless stated otherwise. °· Choose water, low-fat milk, unsweetened iced tea, or other drinks without added sugar. If you want an alcoholic beverage, choose a lower calorie option such as a glass of wine or light beer. °· Ask for dressings, sauces, and syrups on the side. These are usually high in calories, so you should limit the amount you eat. °· If you want a salad, choose a garden salad and ask for grilled meats. Avoid extra toppings like bacon, cheese, or fried items. Ask for the dressing on the side, or ask for olive oil and vinegar or lemon to use as dressing. °· Estimate how many servings of a food you are given. For example, a serving of  cooked rice is ½ cup or about the size of half a baseball. Knowing serving sizes will help you be aware of how much food you are eating at restaurants. The list below tells you how big or small some common portion sizes are based on everyday objects: °? 1 oz--4 stacked dice. °? 3 oz--1 deck of cards. °? 1 tsp--1 die. °? 1 Tbsp--½ a ping-pong ball. °? 2 Tbsp--1 ping-pong ball. °? ½ cup--½ baseball. °? 1 cup--1 baseball. °Summary °· Calorie counting means keeping track of how many calories you eat and drink each day. If you eat fewer calories than your body needs, you should lose weight. °· A healthy amount of weight to lose per week is usually 1-2 lb (0.5-0.9 kg). This usually means reducing your daily calorie intake by 500-750 calories. °· The number of calories in a food can be found on a Nutrition Facts label. If a food does not have a Nutrition Facts label, try to look up the calories online or ask your dietitian for help. °· Use your calories on foods and drinks that will fill you up, and not on foods and drinks that will leave you hungry. °· Use smaller plates, glasses, and bowls to prevent overeating. °This information is not intended to replace advice given to you by your health care provider. Make sure you discuss any questions you have with your health care provider. °Document Released: 07/05/2005 Document Revised: 03/24/2018 Document Reviewed: 06/04/2016 °Elsevier Patient Education © 2020 [employer] ° °

## 2019-01-30 ENCOUNTER — Ambulatory Visit (HOSPITAL_COMMUNITY)
Admission: RE | Admit: 2019-01-30 | Discharge: 2019-01-30 | Disposition: A | Discharge status: Home / Self Care | Payer: Medicaid Other | Source: Ambulatory Visit | Attending: Family Medicine | Admitting: Family Medicine

## 2019-01-30 ENCOUNTER — Other Ambulatory Visit: Payer: Self-pay | Admitting: Family Medicine

## 2019-01-30 DIAGNOSIS — E119 Type 2 diabetes mellitus without complications: Secondary | ICD-10-CM | POA: Diagnosis not present

## 2019-01-30 DIAGNOSIS — R0609 Other forms of dyspnea: Secondary | ICD-10-CM

## 2019-01-30 DIAGNOSIS — I082 Rheumatic disorders of both aortic and tricuspid valves: Secondary | ICD-10-CM | POA: Diagnosis not present

## 2019-01-30 DIAGNOSIS — I10 Essential (primary) hypertension: Secondary | ICD-10-CM | POA: Insufficient documentation

## 2019-01-30 NOTE — Progress Notes (Signed)
  Echocardiogram 2D Echocardiogram has been performed.  [name] G [name] [name] 01/30/2019, 3:41 PM

## 2019-01-31 NOTE — Progress Notes (Deleted)
{Choose 1 Note Type (Telehealth Visit or Telephone Visit):[phone]}   Date:  01/31/2019   ID:  [name] [name], DOB [birth year], MRN [id number]  {Patient Location:[phone]::"Home"} {Provider Location:[phone]::"Home"}  PCP:  [name], MD  Cardiologist:  No primary care provider on file. *** Electrophysiologist:  None   Evaluation Performed:  {Choose Visit [phone]::"Follow-Up Visit"}  Chief Complaint:  ***  History of Present Illness:    [name] [name] is a 58 y.o. female with no past cardiac history who is having dyspnea. ***  She saw her primary MD two days ago and she complained of dyspnea.  She has significant dyspnea.  An echo has been ordered.  She had a normal BNP. ***   The patient {does/does not:200015} have symptoms concerning for COVID-19 infection (fever, chills, cough, or new shortness of breath).    Past Medical History:  Diagnosis Date  . Anemia   . Chronic hepatitis C without hepatic coma ([place]) 11/09/2016  . Diabetes mellitus   . Fibroids   . 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 Hypertension   . MRSA (methicillin resistant Staphylococcus aureus)   . Stomach cancer ([place])   . Trichomonas   . VAGINITIS, BACTERIAL, RECURRENT 08/15/2007   Qualifier: Diagnosis of  By: Radene Ou MD, [place]     Past Surgical History:  Procedure Laterality Date  . CESAREAN SECTION     breech     No outpatient medications have been marked as taking for the 02/01/19 encounter (Appointment) with Minus Breeding, MD.     Allergies:   Patient has no known allergies.   Social History   Tobacco Use  . Smoking status: Never Smoker  . Smokeless tobacco: Never Used  Substance Use Topics  . Alcohol use: No  . Drug use: No    Types: Cocaine, Marijuana    Comment: remote h/o cocaine and marijuana use     Family Hx: The patient's family history includes Colon cancer in her mother; Liver disease in her sister. There is no history of  Other, Breast cancer, Esophageal cancer, or Rectal cancer.  ROS:   Please see the history of present illness.    *** All other systems reviewed and are negative.   Prior CV studies:   The following studies were reviewed today:  ***  Labs/Other Tests and Data Reviewed:    EKG:  NSR, rate 97, axis WNL, intervals WNL, early transition in the anterior precordial leads.  08/17/18  Recent Labs: 08/31/2018: Hemoglobin 11.3; Platelets 208 01/29/2019: ALT 9; BNP 7.9; BUN 31; Creatinine, Ser 1.33; Potassium 4.0; Sodium 144   Recent Lipid Panel Lab Results  Component Value Date/Time   CHOL 147 01/29/2019 11:18 AM   TRIG 102 01/29/2019 11:18 AM   HDL 59 01/29/2019 11:18 AM   CHOLHDL 2.5 01/29/2019 11:18 AM   CHOLHDL 3 06/18/2014 09:51 AM   LDLCALC 68 01/29/2019 11:18 AM    Wt Readings from Last 3 Encounters:  01/29/19 238 lb (108 kg)  10/03/18 239 lb (108.4 kg)  09/27/18 238 lb 11.2 oz (108.3 kg)     Objective:    Vital Signs:  LMP 04/01/2010    {HeartCare Virtual Exam (Optional):[phone]::"VITAL SIGNS:  reviewed"}  ASSESSMENT & PLAN:    DYSPNEA:  ***  DM:  A1C was 8.1.  ***  HTN:  ***  DYSLIPIDEMIA:  ***    COVID-19 Education: The signs and symptoms of COVID-19 were discussed with the patient and how to seek  care for testing (follow up with PCP or arrange E-visit).  ***The importance of social distancing was discussed today.  Time:   Today, I have spent *** minutes with the patient with telehealth technology discussing the above problems.     Medication Adjustments/Labs and Tests Ordered: Current medicines are reviewed at length with the patient today.  Concerns regarding medicines are outlined above.   Tests Ordered: No orders of the defined types were placed in this encounter.   Medication Changes: No orders of the defined types were placed in this encounter.   Follow Up:  {F/U Format:[phone]} {follow up:15908}  Signed, Minus Breeding, MD   01/31/2019 9:42 PM    [facility]

## 2019-02-01 ENCOUNTER — Telehealth: Payer: Medicaid Other | Admitting: Cardiology

## 2019-02-02 LAB — LIPID PANEL
Chol/HDL Ratio: 2.5 ratio (ref 0.0–4.4)
Cholesterol, Total: 147 mg/dL (ref 100–199)
HDL: 59 mg/dL (ref >39)
LDL Calculated: 68 mg/dL (ref 0–99)
Triglycerides: 102 mg/dL (ref 0–149)
VLDL Cholesterol Cal: 20 mg/dL (ref 5–40)

## 2019-02-02 LAB — CMP14+EGFR
ALT: 9 IU/L (ref 0–32)
AST: 11 IU/L (ref 0–40)
Albumin/Globulin Ratio: 1.4 (ref 1.2–2.2)
Albumin: 4.1 g/dL (ref 3.8–4.9)
Alkaline Phosphatase: 84 IU/L (ref 39–117)
BUN/Creatinine Ratio: 23 (ref 9–23)
BUN: 31 mg/dL — ABNORMAL HIGH (ref 6–24)
Bilirubin Total: 0.2 mg/dL (ref 0.0–1.2)
CO2: 21 mmol/L (ref 20–29)
Calcium: 9.1 mg/dL (ref 8.7–10.2)
Chloride: 108 mmol/L — ABNORMAL HIGH (ref 96–106)
Creatinine, Ser: 1.33 mg/dL — ABNORMAL HIGH (ref 0.57–1.00)
GFR calc Af Amer: 51 mL/min/{1.73_m2} — ABNORMAL LOW (ref >59)
GFR calc non Af Amer: 44 mL/min/{1.73_m2} — ABNORMAL LOW (ref >59)
Globulin, Total: 3 g/dL (ref 1.5–4.5)
Glucose: 172 mg/dL — ABNORMAL HIGH (ref 65–99)
Potassium: 4 mmol/L (ref 3.5–5.2)
Sodium: 144 mmol/L (ref 134–144)
Total Protein: 7.1 g/dL (ref 6.0–8.5)

## 2019-02-02 LAB — MICROALBUMIN / CREATININE URINE RATIO
Creatinine, Urine: 66.2 mg/dL
Microalb/Creat Ratio: 1991 mg/g creat — ABNORMAL HIGH (ref 0–29)
Microalbumin, Urine: 1318.3 ug/mL

## 2019-02-02 LAB — BRAIN NATRIURETIC PEPTIDE: BNP: 7.9 pg/mL (ref 0.0–100.0)

## 2019-02-06 ENCOUNTER — Telehealth: Payer: Self-pay

## 2019-02-06 NOTE — Telephone Encounter (Signed)
7-23 @840AM  J[facility]   COVID-19 Pre-Screening Questions:  . In the past 7 to 10 days have you had a cough,  shortness of breath, headache, congestion, fever (100 or greater) body aches, chills, sore throat, or sudden loss of taste or sense of smell? NO . Have you been around anyone with known Covid 19. NO . Have you been around anyone who is awaiting Covid 19 test results in the past 7 to 10 days? NO . Have you been around anyone who has been exposed to Covid 19, or has mentioned symptoms of Covid 19 within the past 7 to 10 days? NO  PT WILL ARRIVE EARLY W/MASK AND NO VISITORS

## 2019-02-07 ENCOUNTER — Telehealth: Payer: Self-pay | Admitting: Cardiology

## 2019-02-07 NOTE — Progress Notes (Signed)
Cardiology Office Note   Date:  02/08/2019   ID:  [name] [name], DOB Aug 27, 1960, MRN [id number]  PCP:  [name] Rakes, MD  Cardiologist:   Minus Breeding, MD Referring:  [name] Rakes, MD   No chief complaint on file.     History of Present Illness: [name] [name] is a 58 y.o. female who is referred for evaluation of SOB.  She had an echo with a mildly reduced EF of 50%.  There is moderate concentric hypertrophy.  We saw her in the past because of difficult to control hypertension poorly controlled sugars.  She comes back today because of dyspnea.  She states she short of breath climbing a flight of stairs.  This is been slowly progressive.  She does not think she be able walk 50 yards without being short of breath.  She is not describing PND or orthopnea.  She has some sharp chest pain.  This seems to come on sporadically.  It does not radiate.  She does not describe associated symptoms.  Goes away spontaneously.  She is not sure how long it has been going on.  She has had a 20 pound weight gain over a couple of years.  She has some mild lower extremity swelling.  She does not exercise.  Does not really watch her diet.   Past Medical History:  Diagnosis Date  . Anemia   . Chronic hepatitis C without hepatic coma ([place]) 11/09/2016  . Diabetes mellitus   . Fibroids   . 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 Hypertension   . MRSA (methicillin resistant Staphylococcus aureus)   . Trichomonas   . VAGINITIS, BACTERIAL, RECURRENT 08/15/2007   Qualifier: Diagnosis of  By: Radene Ou MD, [place]      Past Surgical History:  Procedure Laterality Date  . CESAREAN SECTION     breech     Current Outpatient Medications  Medication Sig Dispense Refill  . albuterol (VENTOLIN HFA) 108 (90 Base) MCG/ACT inhaler Inhale 2 puffs into the lungs every 6 (six) hours as needed for wheezing or shortness of breath. 18 g 3  . amLODipine (NORVASC) 10 MG tablet Take  1 tablet (10 mg total) by mouth daily. 30 tablet 5  . atorvastatin (LIPITOR) 40 MG tablet Take 1 tablet (40 mg total) by mouth daily. 30 tablet 6  . Insulin Glargine (LANTUS SOLOSTAR) 100 UNIT/ML Solostar Pen Inject 65 Units into the skin at bedtime. 5 pen 5  . liraglutide (VICTOZA) 18 MG/3ML SOPN Inject 0.3 mLs (1.8 mg total) into the skin daily with breakfast. 30 mL 6  . lisinopril-hydrochlorothiazide (ZESTORETIC) 20-25 MG tablet Take 1 tablet by mouth daily. 90 tablet 1  . LYRICA 75 MG capsule Take 1 capsule (75 mg total) by mouth 2 (two) times daily. 60 capsule 6  . triamcinolone cream (KENALOG) 0.1 % Apply 1 application topically 2 (two) times daily. 30 g 1  . lisinopril (ZESTRIL) 20 MG tablet Take 1 tablet (20 mg total) by mouth daily. 90 tablet 3   No current facility-administered medications for this visit.     Allergies:   Patient has no known allergies.    Social History:  The patient  reports that she has never smoked. She has never used smokeless tobacco. She reports that she does not drink alcohol or use drugs.   Family History:  The patient's family history includes Colon cancer in her mother; Liver disease in her sister.  ROS:  Please see the history of present illness.   Otherwise, review of systems are positive for none.   All other systems are reviewed and negative.    PHYSICAL EXAM: VS:  BP (!) 154/90   Pulse 85   Ht 5\' 6"  (1.676 m)   Wt 239 lb (108.4 kg)   LMP 04/01/2010   SpO2 95%   BMI 38.58 kg/m  , BMI Body mass index is 38.58 kg/m. GENERAL:  Well appearing HEENT:  Pupils equal round and reactive, fundi not visualized, oral mucosa unremarkable NECK:  No jugular venous distention, waveform within normal limits, carotid upstroke brisk and symmetric, no bruits, no thyromegaly LYMPHATICS:  No cervical, inguinal adenopathy LUNGS:  Clear to auscultation bilaterally BACK:  No CVA tenderness CHEST:  Unremarkable HEART:  PMI not displaced or sustained,S1 and S2  within normal limits, no S3, no S4, no clicks, no rubs, no murmurs ABD:  Flat, positive bowel sounds normal in frequency in pitch, no bruits, no rebound, no guarding, no midline pulsatile mass, no hepatomegaly, no splenomegaly EXT:  2 plus pulses throughout, no edema, no cyanosis no clubbing SKIN:  No rashes no nodules NEURO:  Cranial nerves II through XII grossly intact, motor grossly intact throughout PSYCH:  Cognitively intact, oriented to person place and time    EKG:  EKG is ordered today. The ekg ordered today demonstrates sinus rhythm, rate 85, axis within normal limits, intervals within normal limits, no acute ST-T wave changes.   Recent Labs: 08/31/2018: Hemoglobin 11.3; Platelets 208 01/29/2019: ALT 9; BNP 7.9; BUN 31; Creatinine, Ser 1.33; Potassium 4.0; Sodium 144    Lipid Panel    Component Value Date/Time   CHOL 147 01/29/2019 1118   TRIG 102 01/29/2019 1118   HDL 59 01/29/2019 1118   CHOLHDL 2.5 01/29/2019 1118   CHOLHDL 3 06/18/2014 0951   VLDL 19.6 06/18/2014 0951   LDLCALC 68 01/29/2019 1118      Wt Readings from Last 3 Encounters:  02/08/19 239 lb (108.4 kg)  01/29/19 238 lb (108 kg)  10/03/18 239 lb (108.4 kg)      Other studies Reviewed: Additional studies/ records that were reviewed today include: Echo, labs. Review of the above records demonstrates:  Please see elsewhere in the note.     ASSESSMENT AND PLAN:  DM:   This has been marginally controlled.  I will defer to her primary care physician.  HTN: I am going to increase her lisinopril to 40 mg daily and continue the other meds as listed.  We talked extensively about salt and fluid restriction.  DYSPNEA: I think this is multifactorial.  There is some left ventricular dysfunction.  There is some left ventricular hypertrophy.  Again weight, increased activity and fluid restriction will play a role.  I am going to prescribe our Health Educator.  I have also controlled her blood pressure.  I do  note that she had a normal BNP.  Eventually I will follow-up probably with a PYP scan though I think hypertension is the reason for her LVH.  Hopefully with blood pressure control, salt and fluid restriction and weight loss this will improve.  She might need right heart cath eventually it may be pulmonary testing.  OBESITY:   As above.  SNORING: She has this and daytime somnolence.  She most likely has sleep apnea and I will screen for this.  CKD:  Creat is elevated but lower than baseline.  We talked about how blood pressure contributes to  renal insufficiency.   Current medicines are reviewed at length with the patient today.  The patient does not have concerns regarding medicines.  The following changes have been made:  As above  Labs/ tests ordered today include:   Orders Placed This Encounter  Procedures  . EKG 12-Lead  . Split night study     Disposition:   FU with me in 3 months    Signed, Minus Breeding, MD  02/08/2019 9:44 AM    [facility]

## 2019-02-07 NOTE — Telephone Encounter (Signed)
I confirmed her appt for 02-08-19 with Dr Percival Spanish.

## 2019-02-08 ENCOUNTER — Ambulatory Visit: Payer: Medicaid Other | Admitting: Cardiology

## 2019-02-08 ENCOUNTER — Other Ambulatory Visit: Payer: Self-pay

## 2019-02-08 ENCOUNTER — Encounter: Payer: Self-pay | Admitting: Cardiology

## 2019-02-08 VITALS — BP 154/90 | HR 85 | Ht 66.0 in | Wt 239.0 lb

## 2019-02-08 DIAGNOSIS — R0683 Snoring: Secondary | ICD-10-CM

## 2019-02-08 DIAGNOSIS — R400 Somnolence: Secondary | ICD-10-CM

## 2019-02-08 DIAGNOSIS — I10 Essential (primary) hypertension: Secondary | ICD-10-CM

## 2019-02-08 DIAGNOSIS — R0602 Shortness of breath: Secondary | ICD-10-CM | POA: Diagnosis not present

## 2019-02-08 DIAGNOSIS — I429 Cardiomyopathy, unspecified: Secondary | ICD-10-CM | POA: Diagnosis not present

## 2019-02-08 DIAGNOSIS — N182 Chronic kidney disease, stage 2 (mild): Secondary | ICD-10-CM | POA: Diagnosis not present

## 2019-02-08 MED ORDER — LISINOPRIL 20 MG PO TABS: 20.0000 mg | ORAL_TABLET | Freq: Every day | ORAL | 3 refills | Status: DC

## 2019-02-08 MED ORDER — LISINOPRIL-HYDROCHLOROTHIAZIDE 20-25 MG PO TABS: 1.0000 | ORAL_TABLET | Freq: Every day | ORAL | 1 refills | Status: DC

## 2019-02-08 NOTE — Patient Instructions (Signed)
Medication Instructions:  INCREASE LISINOPRIL-HCTZ 40/25MG  DAILY-THIS WILL TAKE 2 DIFFERENT TABS If you need a refill on your cardiac medications before your next appointment, please call your pharmacy.  Testing/Procedures: Your physician has recommended that you have a sleep study. This test records several body functions during sleep, including: brain activity, eye movement, oxygen and carbon dioxide blood levels, heart rate and rhythm, breathing rate and rhythm, the flow of air through your mouth and nose, snoring, body muscle movements, and chest and belly movement. SOMEONE WILL BE CALLING YOU TO SCHEDULE THIS TEST  Special Instructions: ONE OF OUR CARE COORDINATORS WILL BE CALLING YOU TO DISCUSS SERVICES  Follow-Up: You will need a follow up appointment in 3 months.   You may see Minus Breeding, MD or one of the following Advanced Practice Providers on your designated Care Team:     Rosaria Ferries, PA-C  Jory Sims, DNP, ANP     At [facility], you and your health needs are our priority.  As part of our continuing mission to provide you with exceptional heart care, we have created designated Provider Care Teams.  These Care Teams include your primary Cardiologist (physician) and Advanced Practice Providers (APPs -  Physician Assistants and Nurse Practitioners) who all work together to provide you with the care you need, when you need it.  Thank you for choosing [facility] at [facility]!!

## 2019-02-09 ENCOUNTER — Telehealth: Payer: Self-pay | Admitting: *Deleted

## 2019-02-09 NOTE — Telephone Encounter (Signed)
Left message of sleep study and COVID appointments on voicemail. (ok per DPR)

## 2019-02-21 ENCOUNTER — Other Ambulatory Visit (HOSPITAL_COMMUNITY)
Admission: RE | Admit: 2019-02-21 | Discharge: 2019-02-21 | Disposition: A | Discharge status: Home / Self Care | Payer: Medicaid Other | Source: Ambulatory Visit | Attending: Cardiovascular Disease | Admitting: Cardiovascular Disease

## 2019-02-21 DIAGNOSIS — Z20828 Contact with and (suspected) exposure to other viral communicable diseases: Secondary | ICD-10-CM | POA: Diagnosis not present

## 2019-02-21 DIAGNOSIS — Z01812 Encounter for preprocedural laboratory examination: Secondary | ICD-10-CM | POA: Insufficient documentation

## 2019-02-21 LAB — SARS CORONAVIRUS 2 (TAT 6-24 HRS): SARS Coronavirus 2: NEGATIVE

## 2019-02-24 ENCOUNTER — Ambulatory Visit (HOSPITAL_BASED_OUTPATIENT_CLINIC_OR_DEPARTMENT_OTHER): Payer: Medicaid Other | Attending: Cardiology | Admitting: Cardiovascular Disease

## 2019-02-24 ENCOUNTER — Other Ambulatory Visit: Payer: Self-pay

## 2019-02-24 DIAGNOSIS — R400 Somnolence: Secondary | ICD-10-CM

## 2019-02-24 DIAGNOSIS — Z794 Long term (current) use of insulin: Secondary | ICD-10-CM | POA: Insufficient documentation

## 2019-02-24 DIAGNOSIS — Z79899 Other long term (current) drug therapy: Secondary | ICD-10-CM | POA: Diagnosis not present

## 2019-02-24 DIAGNOSIS — G4733 Obstructive sleep apnea (adult) (pediatric): Secondary | ICD-10-CM | POA: Insufficient documentation

## 2019-02-24 DIAGNOSIS — I10 Essential (primary) hypertension: Secondary | ICD-10-CM | POA: Diagnosis not present

## 2019-02-26 ENCOUNTER — Other Ambulatory Visit: Payer: Self-pay

## 2019-02-28 ENCOUNTER — Encounter (HOSPITAL_BASED_OUTPATIENT_CLINIC_OR_DEPARTMENT_OTHER): Payer: Self-pay | Admitting: Cardiovascular Disease

## 2019-02-28 NOTE — Procedures (Signed)
Patient Name: [name] [name], [name] [name] Date: 02/24/2019 Gender: Female D.O.B: 1961-05-11 Age (years): 58 Referring Provider: Minus Breeding Height (inches): 7 Interpreting Physician: Shelva Majestic MD, ABSM Weight (lbs): 239 RPSGT: Rosebud Poles BMI: 39 MRN: [id number] Neck Size: 17.00  CLINICAL INFORMATION Sleep Study Type: Split Night CPAP  Indication for sleep study: Excessive Daytime Sleepiness, Hypertension  Epworth Sleepiness Score: 14  SLEEP STUDY TECHNIQUE As per the AASM Manual for the Scoring of Sleep and Associated Events v2.3 (April 2016) with a hypopnea requiring 4% desaturations.  The channels recorded and monitored were frontal, central and occipital EEG, electrooculogram (EOG), submentalis EMG (chin), nasal and oral airflow, thoracic and abdominal wall motion, anterior tibialis EMG, snore microphone, electrocardiogram, and pulse oximetry. Continuous positive airway pressure (CPAP) was initiated when the patient met split night criteria and was titrated according to treat sleep-disordered breathing.  MEDICATIONS     albuterol (VENTOLIN HFA) 108 (90 Base) MCG/ACT inhaler             amLODipine (NORVASC) 10 MG tablet         atorvastatin (LIPITOR) 40 MG tablet         Insulin Glargine (LANTUS SOLOSTAR) 100 UNIT/ML Solostar Pen         liraglutide (VICTOZA) 18 MG/3ML SOPN         lisinopril (ZESTRIL) 20 MG tablet         lisinopril-hydrochlorothiazide (ZESTORETIC) 20-25 MG tablet         LYRICA 75 MG capsule         triamcinolone cream (KENALOG) 0.1 %      Medications self-administered by patient taken the night of the study : N/A  RESPIRATORY PARAMETERS Diagnostic  Total AHI (/hr): 12.9 RDI (/hr): 12.9 OA Index (/hr): 1.1 CA Index (/hr): 0.0 REM AHI (/hr): 45.0 NREM AHI (/hr): 5.3 Supine AHI (/hr): 13.9 Non-supine AHI (/hr): 11.2 Min O2 Sat (%): 51.0 Mean O2 (%): 82.7 Time below 88% (min): 139.2   Titration Optimal Pressure (cm):  AHI at Optimal  Pressure (/hr): N/A Min O2 at Optimal Pressure (%): 77.0 Supine % at Optimal (%): N/A Sleep % at Optimal (%): N/A   SLEEP ARCHITECTURE The recording time for the entire night was 384.6 minutes.  During a baseline period of 175.3 minutes, the patient slept for 167.5 minutes in REM and nonREM, yielding a sleep efficiency of 95.6%%. Sleep onset after lights out was 7.8 minutes with a REM latency of 50.5 minutes. The patient spent 1.5%% of the night in stage N1 sleep, 50.1%% in stage N2 sleep, 29.3%% in stage N3 and 19.1% in REM.  During the titration period of 194.1 minutes, the patient slept for 151.5 minutes in REM and nonREM, yielding a sleep efficiency of 78.1%%. Sleep onset after CPAP initiation was 5.3 minutes with a REM latency of 143.0 minutes. The patient spent 2.3%% of the night in stage N1 sleep, 87.8%% in stage N2 sleep, 0.0%% in stage N3 and 9.9% in REM.  CARDIAC DATA The 2 lead EKG demonstrated sinus rhythm. The mean heart rate was 100.0 beats per minute. Other EKG findings include: None.  LEG MOVEMENT DATA The total Periodic Limb Movements of Sleep (PLMS) were 0. The PLMS index was 0.0 .  IMPRESSIONS - Mild obstructive sleep apnea overall during the diagnostic portion of the study (AHI 12.9 /hour); however, events were severe during REM sleep (AHI 45.0/h). CPAP was initiated at 5 cm and was titrated to 15 cm of water. AHI at 15  cm was still elevated at 20.7/h, with O2 nadir at 85%.  An optimal PAP pressure could not be selected for this patient based on the available study data. - No significant central sleep apnea occurred during the diagnostic portion of the study (CAI = 0.0/hour). - Severe oxygen desaturation during the diagnostic portion of the study to a nadir of 51%. - The patient snored with loud snoring volume during the diagnostic portion of the study. - No cardiac abnormalities were noted during this study. - Clinically significant periodic limb movements did not occur  during sleep.  DIAGNOSIS - Obstructive Sleep Apnea (327.23 [G47.33 ICD-10]) - Nocturnal hypoxia  RECOMMENDATIONS - Recommend expeditious BiPAP titration given sub-optimal CPAP titration. Supplemental oxygen may be necessary if oxygen desaturation persists.  - Efforts should be made to optimize nasal and oropharyngeal patency. - Avoid alcohol, sedatives and other CNS depressants that may worsen sleep apnea and disrupt normal sleep architecture. - Sleep hygiene should be reviewed to assess factors that may improve sleep quality. - Weight management and regular exercise should be initiated or continued. - Recommend a sleep clinic evaluation after 4 weeks of BiPAP therapy.  [Electronically signed] 02/28/2019 02:51 PM  Shelva Majestic MD, [facility], [place], American Board of Sleep Medicine   NPI: [id number] [place] PH: [phone]   FX: [phone] [place]

## 2019-03-02 ENCOUNTER — Other Ambulatory Visit: Payer: Self-pay | Admitting: Cardiovascular Disease

## 2019-03-02 ENCOUNTER — Telehealth: Payer: Self-pay | Admitting: *Deleted

## 2019-03-02 DIAGNOSIS — G4736 Sleep related hypoventilation in conditions classified elsewhere: Secondary | ICD-10-CM

## 2019-03-02 DIAGNOSIS — I10 Essential (primary) hypertension: Secondary | ICD-10-CM

## 2019-03-02 DIAGNOSIS — IMO0002 Reserved for concepts with insufficient information to code with codable children: Secondary | ICD-10-CM

## 2019-03-02 DIAGNOSIS — G4733 Obstructive sleep apnea (adult) (pediatric): Secondary | ICD-10-CM

## 2019-03-02 NOTE — Telephone Encounter (Signed)
Patient notified of sleep study results and recommendations. All questions were answered to satisfaction. She agrees to have BIPAP titration study. This has been scheduled for 03/13/19 with COVID testing on 03/09/19 @ 2;30 pm. Patient was informed of these appointments as well as the need to she will need to quarantine until after the sleep study. She voiced understanding of this.

## 2019-03-09 ENCOUNTER — Other Ambulatory Visit (HOSPITAL_COMMUNITY)
Admission: RE | Admit: 2019-03-09 | Discharge: 2019-03-09 | Disposition: A | Discharge status: Home / Self Care | Payer: Medicaid Other | Source: Ambulatory Visit | Attending: Cardiovascular Disease | Admitting: Cardiovascular Disease

## 2019-03-09 DIAGNOSIS — Z01812 Encounter for preprocedural laboratory examination: Secondary | ICD-10-CM | POA: Diagnosis not present

## 2019-03-09 DIAGNOSIS — Z20828 Contact with and (suspected) exposure to other viral communicable diseases: Secondary | ICD-10-CM | POA: Diagnosis not present

## 2019-03-09 LAB — SARS CORONAVIRUS 2 (TAT 6-24 HRS): SARS Coronavirus 2: NEGATIVE

## 2019-03-13 ENCOUNTER — Other Ambulatory Visit: Payer: Self-pay

## 2019-03-13 ENCOUNTER — Ambulatory Visit (HOSPITAL_BASED_OUTPATIENT_CLINIC_OR_DEPARTMENT_OTHER): Payer: Medicaid Other | Attending: Cardiovascular Disease | Admitting: Cardiovascular Disease

## 2019-03-13 DIAGNOSIS — G4736 Sleep related hypoventilation in conditions classified elsewhere: Secondary | ICD-10-CM | POA: Diagnosis not present

## 2019-03-13 DIAGNOSIS — R0683 Snoring: Secondary | ICD-10-CM

## 2019-03-13 DIAGNOSIS — Z79899 Other long term (current) drug therapy: Secondary | ICD-10-CM | POA: Insufficient documentation

## 2019-03-13 DIAGNOSIS — G4733 Obstructive sleep apnea (adult) (pediatric): Secondary | ICD-10-CM | POA: Diagnosis not present

## 2019-03-13 DIAGNOSIS — Z794 Long term (current) use of insulin: Secondary | ICD-10-CM | POA: Diagnosis not present

## 2019-03-13 DIAGNOSIS — I10 Essential (primary) hypertension: Secondary | ICD-10-CM | POA: Insufficient documentation

## 2019-03-13 DIAGNOSIS — IMO0002 Reserved for concepts with insufficient information to code with codable children: Secondary | ICD-10-CM

## 2019-03-14 ENCOUNTER — Other Ambulatory Visit (HOSPITAL_BASED_OUTPATIENT_CLINIC_OR_DEPARTMENT_OTHER): Payer: Self-pay

## 2019-03-14 DIAGNOSIS — I10 Essential (primary) hypertension: Secondary | ICD-10-CM

## 2019-03-14 DIAGNOSIS — G4733 Obstructive sleep apnea (adult) (pediatric): Secondary | ICD-10-CM

## 2019-03-14 DIAGNOSIS — IMO0002 Reserved for concepts with insufficient information to code with codable children: Secondary | ICD-10-CM

## 2019-03-14 DIAGNOSIS — G4736 Sleep related hypoventilation in conditions classified elsewhere: Secondary | ICD-10-CM

## 2019-03-19 ENCOUNTER — Ambulatory Visit
Admission: RE | Admit: 2019-03-19 | Discharge: 2019-03-19 | Disposition: A | Discharge status: Home / Self Care | Payer: Medicaid Other | Source: Ambulatory Visit | Attending: Family Medicine | Admitting: Family Medicine

## 2019-03-19 ENCOUNTER — Other Ambulatory Visit: Payer: Self-pay | Admitting: Family Medicine

## 2019-03-19 ENCOUNTER — Other Ambulatory Visit: Payer: Self-pay

## 2019-03-19 DIAGNOSIS — Z794 Long term (current) use of insulin: Secondary | ICD-10-CM

## 2019-03-19 DIAGNOSIS — E1149 Type 2 diabetes mellitus with other diabetic neurological complication: Secondary | ICD-10-CM

## 2019-03-19 DIAGNOSIS — Z1239 Encounter for other screening for malignant neoplasm of breast: Secondary | ICD-10-CM

## 2019-03-19 DIAGNOSIS — Z1231 Encounter for screening mammogram for malignant neoplasm of breast: Secondary | ICD-10-CM | POA: Diagnosis not present

## 2019-03-21 ENCOUNTER — Encounter (HOSPITAL_BASED_OUTPATIENT_CLINIC_OR_DEPARTMENT_OTHER): Payer: Self-pay | Admitting: Cardiovascular Disease

## 2019-03-21 ENCOUNTER — Telehealth: Payer: Self-pay | Admitting: Family Medicine

## 2019-03-21 ENCOUNTER — Telehealth: Payer: Self-pay

## 2019-03-21 DIAGNOSIS — E1149 Type 2 diabetes mellitus with other diabetic neurological complication: Secondary | ICD-10-CM

## 2019-03-21 DIAGNOSIS — Z794 Long term (current) use of insulin: Secondary | ICD-10-CM

## 2019-03-21 NOTE — Procedures (Signed)
Patient Name: [name] [name], [place] Date: 03/13/2019 Gender: Female D.O.B: 07/26/1960 Age (years): 31 Referring Provider: Minus Breeding Height (inches): 66 Interpreting Physician: Shelva Majestic MD, ABSM Weight (lbs): 239 RPSGT: Carolin Coy BMI: 4 MRN: [id number] Neck Size: 14.50  CLINICAL INFORMATION The patient is referred for a BiPAP titration to treat sleep apnea.  Date of Split Night: 02/24/2019: AHI 12.9; AHI during REM sleep 45.0/h; O2 desaturation to 51%. Patient failed CPAP titration.  SLEEP STUDY TECHNIQUE As per the AASM Manual for the Scoring of Sleep and Associated Events v2.3 (April 2016) with a hypopnea requiring 4% desaturations.  The channels recorded and monitored were frontal, central and occipital EEG, electrooculogram (EOG), submentalis EMG (chin), nasal and oral airflow, thoracic and abdominal wall motion, anterior tibialis EMG, snore microphone, electrocardiogram, and pulse oximetry. Bilevel positive airway pressure (BPAP) was initiated at the beginning of the study and titrated to treat sleep-disordered breathing.  MEDICATIONS     albuterol (VENTOLIN HFA) 108 (90 Base) MCG/ACT inhaler         amLODipine (NORVASC) 10 MG tablet         atorvastatin (LIPITOR) 40 MG tablet         Insulin Glargine (LANTUS SOLOSTAR) 100 UNIT/ML Solostar Pen         liraglutide (VICTOZA) 18 MG/3ML SOPN         lisinopril (ZESTRIL) 20 MG tablet         lisinopril-hydrochlorothiazide (ZESTORETIC) 20-25 MG tablet         LYRICA 75 MG capsule         triamcinolone cream (KENALOG) 0.1 %      Medications self-administered by patient taken the night of the study : lyrica  RESPIRATORY PARAMETERS Optimal IPAP Pressure (cm): 24 AHI at Optimal Pressure (/hr) 0.0 Optimal EPAP Pressure (cm): 20   Overall Minimal O2 (%): 53.0 Minimal O2 at Optimal Pressure (%): 94.0  SLEEP ARCHITECTURE Start Time: 10:33:01 PM Stop Time: 4:46:42 AM Total Time (min): 373.7 Total Sleep Time  (min): 353.9 Sleep Latency (min): 13.7 Sleep Efficiency (%): 94.7% REM Latency (min): 107.0 WASO (min): 6.0 Stage N1 (%): 3.0% Stage N2 (%): 70.5% Stage N3 (%): 0.0% Stage R (%): 26.6 Supine (%): 86.01 Arousal Index (/hr): 3.7   CARDIAC DATA The 2 lead EKG demonstrated sinus rhythm. The mean heart rate was 83.4 beats per minute. Other EKG findings include: None.  LEG MOVEMENT DATA The total Periodic Limb Movements of Sleep (PLMS) were 0. The PLMS index was 0.0. A PLMS index of <15 is considered normal in adults.  IMPRESSIONS - Bi-PAP was initiated at 8/4 and was titrated to optimal pressure of 24/10 due to continued intermittent events and snoring. AHI at 24/21 was 0/h with O2 nadir at 94%. - Central sleep apnea was not noted during this titration (CAI = 0.3/h). - Severe oxygen desaturations to a nadir of 53.0% at 12/8 cm of water. - The patient snored with moderate snoring volume. - No significant cardiac abnormalities were observed during this study; isolated PVC - Clinically significant periodic limb movements were not noted during this study. Arousals associated with PLMs were rare.  DIAGNOSIS - Obstructive Sleep Apnea (327.23 [G47.33 ICD-10])  RECOMMENDATIONS - Recommend an initial trial of BiPAP therapy at 24/20 cm H2O with heated humidification. A Medium size Fisher&Paykel Full Face Mask F&P Vitera (new) mask was used fro the titration.  - Efforts should be made to optimize nasal and oropharygeal patency.  - Avoid alcohol, sedatives  and other CNS depressants that may worsen sleep apnea and disrupt normal sleep architecture. - Sleep hygiene should be reviewed to assess factors that may improve sleep quality. - Weight management and regular exercise should be initiated or continued. - Recommend a download be obtained in 30 days and sleep clinic evaluation after 4 weeks of therapy.  [Electronically signed] 03/21/2019 05:44 PM  Shelva Majestic MD, [facility], ABSM Diplomate, American Board  of Sleep Medicine   NPI: [id number]  [place] PH: [phone]   FX: [phone] [place]

## 2019-03-21 NOTE — Telephone Encounter (Signed)
Patient called stating she was told by the pharmacy that her lyrica needs to be called in. Patient was advised that refills were available . Please follow up.

## 2019-03-21 NOTE — Telephone Encounter (Signed)
Patient name and DOB has been verified Patient was informed of lab results. Patient had no questions.  

## 2019-03-21 NOTE — Telephone Encounter (Signed)
-----   Message from [name], MD sent at 03/20/2019  2:31 PM EDT ----- Mammogram is negative for malignancy

## 2019-03-23 ENCOUNTER — Telehealth: Payer: Self-pay | Admitting: Family Medicine

## 2019-03-23 DIAGNOSIS — Z794 Long term (current) use of insulin: Secondary | ICD-10-CM

## 2019-03-23 DIAGNOSIS — E1149 Type 2 diabetes mellitus with other diabetic neurological complication: Secondary | ICD-10-CM

## 2019-03-23 MED ORDER — LYRICA 75 MG PO CAPS: 75.0000 mg | ORAL_CAPSULE | Freq: Two times a day (BID) | ORAL | 6 refills | Status: DC

## 2019-03-23 NOTE — Telephone Encounter (Signed)
Done

## 2019-03-23 NOTE — Telephone Encounter (Signed)
The pharmacy was called and she states that patient does not have any refills left on her lyrica medication.

## 2019-03-23 NOTE — Telephone Encounter (Signed)
New Message   1) Medication(s) Requested (by name): LYRICA 75 MG capsule  2) Pharmacy of Choice: [facility]  3) Special Requests:   Approved medications will be sent to the pharmacy, we will reach out if there is an issue.  Requests made after 3pm may not be addressed until the following business day!  If a patient is unsure of the name of the medication(s) please note and ask patient to call back when they are able to provide all info, do not send to responsible party until all information is available!

## 2019-03-23 NOTE — Telephone Encounter (Signed)
Patient was called and informed of medication being refilled and sent to pharmacy. 

## 2019-03-28 MED ORDER — LYRICA 75 MG PO CAPS: 75.0000 mg | ORAL_CAPSULE | Freq: Two times a day (BID) | ORAL | 6 refills | Status: DC

## 2019-03-28 NOTE — Telephone Encounter (Signed)
Done

## 2019-03-28 NOTE — Telephone Encounter (Signed)
Follow up   Pt calling states she was told that her lyrica was called in on 9/4 but the pharmacy states it is not valid and pt is requesting it be sent again to [facility]. Please f/u

## 2019-03-28 NOTE — Telephone Encounter (Signed)
[facility] was called and they states that they have not received the script that was sent over on 03/23/2019. They are requesting another script be sent over so that patient can get her medication.

## 2019-03-29 NOTE — Telephone Encounter (Signed)
Pharmacy was called and they state that the script was not electronically signed and they need another script sent over.

## 2019-03-29 NOTE — Telephone Encounter (Signed)
Follow up    Pt is calling in regards to her Lyrica. She tried getting her medication again but the pharmacy is saying they can see the prescription but is unable to accept it. Please f/u

## 2019-03-30 NOTE — Telephone Encounter (Signed)
Patient was called and informed that medication has been called in by pharmacist.

## 2019-03-30 NOTE — Telephone Encounter (Signed)
Follow up   Pt calling want to know if a paper prescription can be wrote out because she really needs her medication and the pharmacy is saying they still can not get the prescription. Pt is upset. Please f/u

## 2019-03-30 NOTE — Telephone Encounter (Signed)
[name] [name] has called in prescription to the Pharmacy.

## 2019-04-03 ENCOUNTER — Telehealth: Payer: Self-pay

## 2019-04-03 NOTE — Telephone Encounter (Signed)
Trying to reach pt in reference to care guide referral . Left vm, will call back.

## 2019-04-09 ENCOUNTER — Emergency Department: Payer: Medicaid Other

## 2019-04-09 ENCOUNTER — Inpatient Hospital Stay
Admission: EM | Admit: 2019-04-09 | Discharge: 2019-04-12 | DRG: 638 | Disposition: A | Discharge status: Home / Self Care | Payer: Medicaid Other | Attending: Internal Medicine | Admitting: Internal Medicine

## 2019-04-09 ENCOUNTER — Encounter: Payer: Self-pay | Admitting: Emergency Medicine

## 2019-04-09 ENCOUNTER — Other Ambulatory Visit: Payer: Self-pay

## 2019-04-09 DIAGNOSIS — Z79899 Other long term (current) drug therapy: Secondary | ICD-10-CM

## 2019-04-09 DIAGNOSIS — Z20828 Contact with and (suspected) exposure to other viral communicable diseases: Secondary | ICD-10-CM | POA: Diagnosis present

## 2019-04-09 DIAGNOSIS — B182 Chronic viral hepatitis C: Secondary | ICD-10-CM | POA: Diagnosis present

## 2019-04-09 DIAGNOSIS — Z794 Long term (current) use of insulin: Secondary | ICD-10-CM | POA: Diagnosis not present

## 2019-04-09 DIAGNOSIS — E860 Dehydration: Secondary | ICD-10-CM | POA: Diagnosis present

## 2019-04-09 DIAGNOSIS — Z23 Encounter for immunization: Secondary | ICD-10-CM | POA: Diagnosis not present

## 2019-04-09 DIAGNOSIS — R739 Hyperglycemia, unspecified: Secondary | ICD-10-CM | POA: Diagnosis present

## 2019-04-09 DIAGNOSIS — R1011 Right upper quadrant pain: Secondary | ICD-10-CM

## 2019-04-09 DIAGNOSIS — E1100 Type 2 diabetes mellitus with hyperosmolarity without nonketotic hyperglycemic-hyperosmolar coma (NKHHC): Principal | ICD-10-CM | POA: Diagnosis present

## 2019-04-09 DIAGNOSIS — Z8614 Personal history of Methicillin resistant Staphylococcus aureus infection: Secondary | ICD-10-CM

## 2019-04-09 DIAGNOSIS — E1122 Type 2 diabetes mellitus with diabetic chronic kidney disease: Secondary | ICD-10-CM | POA: Diagnosis present

## 2019-04-09 DIAGNOSIS — N183 Chronic kidney disease, stage 3 (moderate): Secondary | ICD-10-CM | POA: Diagnosis present

## 2019-04-09 DIAGNOSIS — E785 Hyperlipidemia, unspecified: Secondary | ICD-10-CM | POA: Diagnosis present

## 2019-04-09 DIAGNOSIS — D649 Anemia, unspecified: Secondary | ICD-10-CM | POA: Diagnosis present

## 2019-04-09 DIAGNOSIS — I129 Hypertensive chronic kidney disease with stage 1 through stage 4 chronic kidney disease, or unspecified chronic kidney disease: Secondary | ICD-10-CM | POA: Diagnosis present

## 2019-04-09 DIAGNOSIS — E119 Type 2 diabetes mellitus without complications: Secondary | ICD-10-CM | POA: Diagnosis not present

## 2019-04-09 DIAGNOSIS — E1165 Type 2 diabetes mellitus with hyperglycemia: Secondary | ICD-10-CM | POA: Diagnosis not present

## 2019-04-09 DIAGNOSIS — K8020 Calculus of gallbladder without cholecystitis without obstruction: Secondary | ICD-10-CM | POA: Diagnosis not present

## 2019-04-09 DIAGNOSIS — N179 Acute kidney failure, unspecified: Secondary | ICD-10-CM | POA: Diagnosis not present

## 2019-04-09 DIAGNOSIS — R05 Cough: Secondary | ICD-10-CM | POA: Diagnosis present

## 2019-04-09 DIAGNOSIS — Z800 Family history of malignant neoplasm of digestive organs: Secondary | ICD-10-CM | POA: Diagnosis not present

## 2019-04-09 DIAGNOSIS — R112 Nausea with vomiting, unspecified: Secondary | ICD-10-CM | POA: Diagnosis not present

## 2019-04-09 DIAGNOSIS — N189 Chronic kidney disease, unspecified: Secondary | ICD-10-CM | POA: Diagnosis not present

## 2019-04-09 LAB — LIPASE, BLOOD: Lipase: 54 U/L — ABNORMAL HIGH (ref 11–51)

## 2019-04-09 LAB — CBC
HCT: 33.1 % — ABNORMAL LOW (ref 36.0–46.0)
Hemoglobin: 10.8 g/dL — ABNORMAL LOW (ref 12.0–15.0)
MCH: 26.6 pg (ref 26.0–34.0)
MCHC: 32.6 g/dL (ref 30.0–36.0)
MCV: 81.5 fL (ref 80.0–100.0)
Platelets: 190 10*3/uL (ref 150–400)
RBC: 4.06 MIL/uL (ref 3.87–5.11)
RDW: 12.3 % (ref 11.5–15.5)
WBC: 3.6 10*3/uL — ABNORMAL LOW (ref 4.0–10.5)
nRBC: 0 % (ref 0.0–0.2)

## 2019-04-09 LAB — BASIC METABOLIC PANEL
Anion gap: 14 (ref 5–15)
BUN: 39 mg/dL — ABNORMAL HIGH (ref 6–20)
CO2: 25 mmol/L (ref 22–32)
Calcium: 9 mg/dL (ref 8.9–10.3)
Chloride: 87 mmol/L — ABNORMAL LOW (ref 98–111)
Creatinine, Ser: 2.15 mg/dL — ABNORMAL HIGH (ref 0.44–1.00)
GFR calc Af Amer: 29 mL/min — ABNORMAL LOW (ref >60)
GFR calc non Af Amer: 25 mL/min — ABNORMAL LOW (ref >60)
Glucose, Bld: 584 mg/dL (ref 70–99)
Potassium: 3.6 mmol/L (ref 3.5–5.1)
Sodium: 126 mmol/L — ABNORMAL LOW (ref 135–145)

## 2019-04-09 LAB — URINALYSIS, COMPLETE (UACMP) WITH MICROSCOPIC
Bilirubin Urine: NEGATIVE
Glucose, UA: >=500 mg/dL — AB
Ketones, ur: NEGATIVE mg/dL
Nitrite: NEGATIVE
Protein, ur: 100 mg/dL — AB
Specific Gravity, Urine: 1.01 (ref 1.005–1.030)
pH: 5 (ref 5.0–8.0)

## 2019-04-09 LAB — HEPATIC FUNCTION PANEL
ALT: 12 U/L (ref 0–44)
AST: 13 U/L — ABNORMAL LOW (ref 15–41)
Albumin: 3.8 g/dL (ref 3.5–5.0)
Alkaline Phosphatase: 98 U/L (ref 38–126)
Bilirubin, Direct: 0.1 mg/dL (ref 0.0–0.2)
Indirect Bilirubin: 0.7 mg/dL (ref 0.3–0.9)
Total Bilirubin: 0.8 mg/dL (ref 0.3–1.2)
Total Protein: 7.9 g/dL (ref 6.5–8.1)

## 2019-04-09 LAB — GLUCOSE, CAPILLARY
Glucose-Capillary: 578 mg/dL (ref 70–99)
Glucose-Capillary: 478 mg/dL — ABNORMAL HIGH (ref 70–99)
Glucose-Capillary: 465 mg/dL — ABNORMAL HIGH (ref 70–99)

## 2019-04-09 MED ORDER — SODIUM CHLORIDE 0.9 % IV BOLUS
1000.0000 mL | Freq: Once | INTRAVENOUS | Status: AC
  Administered 2019-04-09: 1000 mL via INTRAVENOUS

## 2019-04-09 MED ORDER — ENOXAPARIN SODIUM 40 MG/0.4ML ~~LOC~~ SOLN
40.0000 mg | SUBCUTANEOUS | Status: DC
  Administered 2019-04-10 – 2019-04-12 (×3): 40 mg via SUBCUTANEOUS
  Filled 2019-04-09 (×3): qty 0.4

## 2019-04-09 MED ORDER — INSULIN REGULAR HUMAN 100 UNIT/ML IJ SOLN
10.0000 [IU] | Freq: Once | INTRAMUSCULAR | Status: AC
  Administered 2019-04-09: 10 [IU] via SUBCUTANEOUS
  Filled 2019-04-09: qty 10

## 2019-04-09 MED ORDER — ONDANSETRON HCL 4 MG PO TABS: 4.0000 mg | ORAL_TABLET | Freq: Four times a day (QID) | ORAL | Status: DC | PRN

## 2019-04-09 MED ORDER — PREGABALIN 75 MG PO CAPS
75.0000 mg | ORAL_CAPSULE | Freq: Two times a day (BID) | ORAL | Status: DC
  Administered 2019-04-10 – 2019-04-12 (×6): 75 mg via ORAL
  Filled 2019-04-09 (×6): qty 1

## 2019-04-09 MED ORDER — ACETAMINOPHEN 650 MG RE SUPP: 650.0000 mg | Freq: Four times a day (QID) | RECTAL | Status: DC | PRN

## 2019-04-09 MED ORDER — METOCLOPRAMIDE HCL 5 MG/ML IJ SOLN
10.0000 mg | Freq: Once | INTRAMUSCULAR | Status: AC
  Administered 2019-04-09: 10 mg via INTRAVENOUS
  Filled 2019-04-09: qty 2

## 2019-04-09 MED ORDER — HYDRALAZINE HCL 20 MG/ML IJ SOLN: 5.0000 mg | INTRAMUSCULAR | Status: DC | PRN

## 2019-04-09 MED ORDER — SODIUM CHLORIDE 0.9 % IV BOLUS
1000.0000 mL | Freq: Once | INTRAVENOUS | Status: AC
  Administered 2019-04-09: 16:00:00 1000 mL via INTRAVENOUS

## 2019-04-09 MED ORDER — ONDANSETRON HCL 4 MG/2ML IJ SOLN: 4.0000 mg | Freq: Four times a day (QID) | INTRAMUSCULAR | Status: DC | PRN

## 2019-04-09 MED ORDER — INSULIN ASPART 100 UNIT/ML ~~LOC~~ SOLN
10.0000 [IU] | Freq: Once | SUBCUTANEOUS | Status: AC
  Administered 2019-04-09: 10 [IU] via INTRAVENOUS
  Filled 2019-04-09: qty 1

## 2019-04-09 MED ORDER — ATORVASTATIN CALCIUM 20 MG PO TABS
40.0000 mg | ORAL_TABLET | Freq: Every day | ORAL | Status: DC
  Administered 2019-04-10 – 2019-04-11 (×2): 40 mg via ORAL
  Filled 2019-04-09 (×3): qty 2

## 2019-04-09 MED ORDER — SODIUM CHLORIDE 0.9 % IV SOLN
INTRAVENOUS | Status: DC
  Administered 2019-04-10 – 2019-04-12 (×3): via INTRAVENOUS

## 2019-04-09 MED ORDER — INSULIN REGULAR HUMAN 100 UNIT/ML IJ SOLN: 10.0000 [IU] | Freq: Once | INTRAMUSCULAR | Status: DC

## 2019-04-09 MED ORDER — INSULIN ASPART 100 UNIT/ML ~~LOC~~ SOLN
0.0000 [IU] | Freq: Three times a day (TID) | SUBCUTANEOUS | Status: DC
  Administered 2019-04-10: 20 [IU] via SUBCUTANEOUS
  Administered 2019-04-10: 11 [IU] via SUBCUTANEOUS
  Administered 2019-04-10: 20 [IU] via SUBCUTANEOUS
  Administered 2019-04-11: 15 [IU] via SUBCUTANEOUS
  Administered 2019-04-11 (×2): 20 [IU] via SUBCUTANEOUS
  Administered 2019-04-12: 4 [IU] via SUBCUTANEOUS
  Filled 2019-04-09 (×7): qty 1

## 2019-04-09 MED ORDER — PANTOPRAZOLE SODIUM 40 MG PO TBEC
40.0000 mg | DELAYED_RELEASE_TABLET | Freq: Two times a day (BID) | ORAL | Status: DC
  Administered 2019-04-10 – 2019-04-12 (×5): 40 mg via ORAL
  Filled 2019-04-09 (×5): qty 1

## 2019-04-09 MED ORDER — INSULIN GLARGINE 100 UNIT/ML SOLOSTAR PEN: 65.0000 [IU] | PEN_INJECTOR | Freq: Every day | SUBCUTANEOUS | Status: DC

## 2019-04-09 MED ORDER — POLYETHYLENE GLYCOL 3350 17 G PO PACK: 17.0000 g | PACK | Freq: Every day | ORAL | Status: DC | PRN

## 2019-04-09 MED ORDER — ACETAMINOPHEN 325 MG PO TABS: 650.0000 mg | ORAL_TABLET | Freq: Four times a day (QID) | ORAL | Status: DC | PRN

## 2019-04-09 MED ORDER — INSULIN ASPART 100 UNIT/ML ~~LOC~~ SOLN
0.0000 [IU] | Freq: Every day | SUBCUTANEOUS | Status: DC
  Administered 2019-04-10: 2 [IU] via SUBCUTANEOUS
  Filled 2019-04-09: qty 1

## 2019-04-09 MED ORDER — AMLODIPINE BESYLATE 10 MG PO TABS
10.0000 mg | ORAL_TABLET | Freq: Every day | ORAL | Status: DC
  Administered 2019-04-10 – 2019-04-12 (×3): 10 mg via ORAL
  Filled 2019-04-09 (×3): qty 1

## 2019-04-09 MED ORDER — DIPHENHYDRAMINE HCL 50 MG/ML IJ SOLN
25.0000 mg | Freq: Once | INTRAMUSCULAR | Status: AC
  Administered 2019-04-09: 25 mg via INTRAVENOUS
  Filled 2019-04-09: qty 1

## 2019-04-09 MED ORDER — ALBUTEROL SULFATE (2.5 MG/3ML) 0.083% IN NEBU: 5.0000 mg | INHALATION_SOLUTION | Freq: Four times a day (QID) | RESPIRATORY_TRACT | Status: DC | PRN

## 2019-04-09 NOTE — H&P (Signed)
[facility] [place] NAME: [name] [name]    MR#:  [id number]  DATE OF BIRTH:  1961-04-20  DATE OF ADMISSION:  04/09/2019  PRIMARY CARE PHYSICIAN: [name], MD   REQUESTING/REFERRING PHYSICIAN: Duffy Bruce, MD  CHIEF COMPLAINT:   Chief Complaint  Patient presents with  . Hyperglycemia    HISTORY OF PRESENT ILLNESS:  [name] [name]  is a 58 y.o. female with a known history of chronic hepatitis C, type 2 diabetes, history of HSV, anemia who presented to the ED with high blood sugars, nausea, and vomiting.  Patient states that yesterday evening, she noticed her blood sugar was high at 600.  She had run out of her Lantus about a week ago.  She tried giving herself 8 units of NovoLog, but her blood sugar stayed high.  Last night, she started vomiting and feeling very nauseous.  This morning, she woke up and had a headache.  She continued vomiting this morning.  She checked her blood sugar earlier today and it was 585.  She started developing epigastric abdominal pain.  She denies any diarrhea.  She endorses polyuria and polydipsia.  No chest pain or shortness of breath.  In the ED, vitals are unremarkable.  Labs are significant for a glucose 584, creatinine 2.15, lipase 54, hemoglobin 10.1.  VBG with pH 7.35.  Anion gap was 14.  Right upper quadrant ultrasound showed cholelithiasis with numerous gallstones in the region of the gallbladder neck, and some possible mild dilatation of the gallbladder, but no gallbladder wall thickening.  She was given fluids and insulin.  Hospitalists were called for admission.  PAST MEDICAL HISTORY:   Past Medical History:  Diagnosis Date  . Anemia   . Chronic hepatitis C without hepatic coma ([place]) 11/09/2016  . Diabetes mellitus   . Fibroids   . 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 Hypertension   . MRSA (methicillin resistant Staphylococcus aureus)   . Trichomonas   .  VAGINITIS, BACTERIAL, RECURRENT 08/15/2007   Qualifier: Diagnosis of  By: Radene Ou MD, Jordan Hawks      PAST SURGICAL HISTORY:   Past Surgical History:  Procedure Laterality Date  . BREAST BIOPSY Left 2018  . CESAREAN SECTION     breech    SOCIAL HISTORY:   Social History   Tobacco Use  . Smoking status: Never Smoker  . Smokeless tobacco: Never Used  Substance Use Topics  . Alcohol use: No    FAMILY HISTORY:   Family History  Problem Relation Age of Onset  . Colon cancer Mother   . Liver disease Sister   . Other Neg Hx   . Breast cancer Neg Hx   . Esophageal cancer Neg Hx   . Rectal cancer Neg Hx     DRUG ALLERGIES:  No Known Allergies  REVIEW OF SYSTEMS:   Review of Systems  Constitutional: Negative for chills and fever.  HENT: Negative for congestion and sore throat.   Eyes: Negative for blurred vision and double vision.  Respiratory: Negative for cough and shortness of breath.   Cardiovascular: Negative for chest pain and palpitations.  Gastrointestinal: Positive for abdominal pain, nausea and vomiting. Negative for constipation and diarrhea.  Genitourinary: Positive for frequency. Negative for dysuria and urgency.  Musculoskeletal: Negative for back pain and neck pain.  Neurological: Negative for dizziness and headaches.  Psychiatric/Behavioral: Negative for depression. The patient is not nervous/anxious.  MEDICATIONS AT HOME:   Prior to Admission medications   Medication Sig Start Date End Date Taking? Authorizing Provider  albuterol (VENTOLIN HFA) 108 (90 Base) MCG/ACT inhaler Inhale 2 puffs into the lungs every 6 (six) hours as needed for wheezing or shortness of breath. 01/29/19   [name], MD  amLODipine (NORVASC) 10 MG tablet Take 1 tablet (10 mg total) by mouth daily. 01/29/19   [name], MD  atorvastatin (LIPITOR) 40 MG tablet Take 1 tablet (40 mg total) by mouth daily. 01/29/19   [name], MD  Insulin Glargine (LANTUS SOLOSTAR)  100 UNIT/ML Solostar Pen Inject 65 Units into the skin at bedtime. 01/29/19   [name], MD  liraglutide (VICTOZA) 18 MG/3ML SOPN Inject 0.3 mLs (1.8 mg total) into the skin daily with breakfast. 01/29/19   [name], MD  lisinopril (ZESTRIL) 20 MG tablet Take 1 tablet (20 mg total) by mouth daily. 02/08/19 05/09/19  Minus Breeding, MD  lisinopril-hydrochlorothiazide (ZESTORETIC) 20-25 MG tablet Take 1 tablet by mouth daily. 02/08/19   Minus Breeding, MD  LYRICA 75 MG capsule Take 1 capsule (75 mg total) by mouth 2 (two) times daily. 03/28/19   [name], MD  triamcinolone cream (KENALOG) 0.1 % Apply 1 application topically 2 (two) times daily. 01/29/19   [name], MD      VITAL SIGNS:  Blood pressure 131/82, pulse 75, temperature 97.9 F (36.6 C), temperature source Oral, resp. rate 20, height 5\' 4"  (1.626 m), weight 108.9 kg, last menstrual period 04/01/2010, SpO2 95 %.  PHYSICAL EXAMINATION:  Physical Exam  GENERAL:  58 y.o.-year-old patient lying in the bed with no acute distress.  EYES: Pupils equal, round, reactive to light and accommodation. No scleral icterus. Extraocular muscles intact.  HEENT: Head atraumatic, normocephalic. Oropharynx and nasopharynx clear. + Dry mucous membranes. NECK:  Supple, no jugular venous distention. No thyroid enlargement, no tenderness.  LUNGS: Normal breath sounds bilaterally, no wheezing, rales,rhonchi or crepitation. No use of accessory muscles of respiration.  CARDIOVASCULAR: RRR, S1, S2 normal. No murmurs, rubs, or gallops.  ABDOMEN: Soft, nondistended. Bowel sounds present. No organomegaly or mass. + Epigastric abdominal pain, no rebound or guarding.  Murphy sign negative.  No right upper quadrant pain. EXTREMITIES: No pedal edema, cyanosis, or clubbing.  NEUROLOGIC: Cranial nerves II through XII are intact. Muscle strength 5/5 in all extremities. Sensation intact. Gait not checked.  PSYCHIATRIC: The patient is alert and  oriented x 3.  SKIN: No obvious rash, lesion, or ulcer.   LABORATORY PANEL:   CBC Recent Labs  Lab 04/09/19 1615  WBC 3.6*  HGB 10.8*  HCT 33.1*  PLT 190   ------------------------------------------------------------------------------------------------------------------  Chemistries  Recent Labs  Lab 04/09/19 1615  NA 126*  K 3.6  CL 87*  CO2 25  GLUCOSE 584*  BUN 39*  CREATININE 2.15*  CALCIUM 9.0  AST 13*  ALT 12  ALKPHOS 98  BILITOT 0.8   ------------------------------------------------------------------------------------------------------------------  Cardiac Enzymes No results for input(s): TROPONINI in the last 168 hours. ------------------------------------------------------------------------------------------------------------------  RADIOLOGY:  Dg Chest Portable 1 View  Result Date: 04/09/2019 CLINICAL DATA:  Cough EXAM: PORTABLE CHEST 1 VIEW COMPARISON:  August 17, 2018. FINDINGS: The heart size and mediastinal contours are within normal limits. Both lungs are clear. The visualized skeletal structures are unremarkable. IMPRESSION: No active disease. Electronically Signed   By: Constance Holster M.D.   On: 04/09/2019 17:14   US Abdomen Limited Ruq  Result Date: 04/09/2019 CLINICAL DATA:  Right upper quadrant  abdominal pain, vomiting EXAM: ULTRASOUND ABDOMEN LIMITED RIGHT UPPER QUADRANT COMPARISON:  12/02/2016 FINDINGS: Gallbladder: There are multiple echogenic, shadowing stones within the gallbladder body and neck. Gallbladder is mildly dilated. No wall thickening visualized. No sonographic Murphy sign noted by sonographer. Common bile duct: Diameter: 4 mm Liver: No focal lesion identified. Mildly increased hepatic parenchymal echogenicity. Portal vein is patent on color Doppler imaging with normal direction of blood flow towards the liver. Other: None. IMPRESSION: 1. Cholelithiasis with numerous gallstones in the region of the gallbladder neck. Gallbladder is  mildly dilated, however there is no focal gallbladder wall thickening and no reported sonographic Murphy's sign. Consider nuclear medicine hepatobiliary scan if clinical suspicion for acute cholecystitis remains high. 2. The echogenicity of the liver is increased. This is a nonspecific finding but is most commonly seen with fatty infiltration of the liver. There are no obvious focal liver lesions. Electronically Signed   By: Davina Poke M.D.   On: 04/09/2019 18:26      IMPRESSION AND PLAN:   Hyperglycemic hyperosmolar syndrome in uncontrolled type 2 diabetes- due to running out of her insulin about 1 week ago.  No signs of DKA. Blood sugar has already come down, so will hold off on starting insulin drip at this point. -Will give another 10 units IV regular insulin now -Continue Lantus 65 units nightly -Resistant SSI -Continue IV fluids -Check A1c  AKI in CKD III- due to dehydration. -Holding home lisinopril-HCTZ -IV fluids -Avoid nephrotoxic agents  Nausea/vomiting/epigastric abdominal pain- likely due to hyperglycemia -IV antiemetics -Start Protonix bid  Cholelithiasis- asymptomatic.  Incidental finding on right upper quadrant abdominal ultrasound.  No tenderness to palpation of the right upper quadrant on exam. -Monitor -Consider referral to general surgery as an outpatient  Hypertension- normotensive in the ED -Continue home norvasc   Hyperlipidemia-stable -Continue home Lipitor  Chronic normocytic anemia- hemoglobin at baseline -Monitor  All the records are reviewed and case discussed with ED provider. Management plans discussed with the patient, family and they are in agreement.  CODE STATUS: Full  TOTAL TIME TAKING CARE OF THIS PATIENT: 45 minutes.    Berna Spare Mayo M.D on 04/09/2019 at 8:04 PM  Between 7am to 6pm - Pager - [phone]  After 6pm go to [email or web address] - Proofreader  [facility]  Office  [phone]  CC:  Primary care physician; [name], MD   Note: This dictation was prepared with Dragon dictation along with smaller phrase technology. Any transcriptional errors that result from this process are unintentional.

## 2019-04-09 NOTE — ED Notes (Addendum)
ED TO INPATIENT HANDOFF REPORT  ED Nurse Name and Phone #:   Gershon Mussel RN  #[phone]  S Name/Age/Gender [name] [name] 58 y.o. female Room/Bed: ED08A/ED08A  Code Status   Code Status: Prior  Home/SNF/Other Home Patient oriented to: self, place, time and situation Is this baseline? Yes   Triage Complete: Triage complete  Chief Complaint Sugar Level High  Triage Note Pt here for vomiting since yesterday, is diabetic.  Pt reports called EMS yesterday because sugar read "high" on machine and EMS came and told her that she should stay home because there was covid at the hospitals and she is diabetic.  Unable to keep liquids down.  Has been using her insulin and reports at home sugar was 585 today.  No diarrhea or fevers.   Allergies No Known Allergies  Level of Care/Admitting Diagnosis ED Disposition    ED Disposition Condition [facility]: [place] [[id number]]  Level of Care: Med-Surg [16]  Covid Evaluation: Asymptomatic Screening Protocol (No Symptoms)  Diagnosis: Hyperglycemia [701779]  Admitting Physician: Hyman Bible DODD [[id number]]  Attending Physician: Hyman Bible DODD [[id number]]  Estimated length of stay: past midnight tomorrow  Certification:: I certify this patient will need inpatient services for at least 2 midnights  PT Class (Do Not Modify): Inpatient [101]  PT Acc Code (Do Not Modify): Private [1]       B Medical/Surgery History Past Medical History:  Diagnosis Date  . Anemia   . Chronic hepatitis C without hepatic coma ([place]) 11/09/2016  . Diabetes mellitus   . Fibroids   . 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 Hypertension   . MRSA (methicillin resistant Staphylococcus aureus)   . Trichomonas   . VAGINITIS, BACTERIAL, RECURRENT 08/15/2007   Qualifier: Diagnosis of  By: Radene Ou MD, [place]     Past Surgical History:  Procedure Laterality Date  . BREAST BIOPSY Left 2018  . CESAREAN  SECTION     breech     A IV Location/Drains/Wounds Patient Lines/Drains/Airways Status   Active Line/Drains/Airways    Name:   Placement date:   Placement time:   Site:   Days:   Peripheral IV 04/09/19 Right Antecubital   04/09/19    1616    Antecubital   less than 1          Intake/Output Last 24 hours  Intake/Output Summary (Last 24 hours) at 04/09/2019 2325 Last data filed at 04/09/2019 2154 Gross per 24 hour  Intake 3000 ml  Output -  Net 3000 ml    Labs/Imaging Results for orders placed or performed during the hospital encounter of 04/09/19 (from the past 48 hour(s))  Glucose, capillary     Status: Abnormal   Collection Time: 04/09/19  3:47 PM  Result Value Ref Range   Glucose-Capillary 578 (HH) 70 - 99 mg/dL   Comment 1 Call MD NNP PA CNM   Basic metabolic panel     Status: Abnormal   Collection Time: 04/09/19  4:15 PM  Result Value Ref Range   Sodium 126 (L) 135 - 145 mmol/L   Potassium 3.6 3.5 - 5.1 mmol/L   Chloride 87 (L) 98 - 111 mmol/L   CO2 25 22 - 32 mmol/L   Glucose, Bld 584 (HH) 70 - 99 mg/dL    Comment: CRITICAL RESULT CALLED TO, READ BACK BY AND VERIFIED WITH THERESA CLAPP AT 1654 04/09/2019  TFK  BUN 39 (H) 6 - 20 mg/dL   Creatinine, [name] 2.15 (H) 0.44 - 1.00 mg/dL   Calcium 9.0 8.9 - 10.3 mg/dL   GFR calc non Af Amer 25 (L) >60 mL/min   GFR calc Af Amer 29 (L) >60 mL/min   Anion gap 14 5 - 15    Comment: Performed at [facility], [address] [place], [place] [address]  CBC     Status: Abnormal   Collection Time: 04/09/19  4:15 PM  Result Value Ref Range   WBC 3.6 (L) 4.0 - 10.5 K/uL   RBC 4.06 3.87 - 5.11 MIL/uL   Hemoglobin 10.8 (L) 12.0 - 15.0 g/dL   HCT 33.1 (L) 36.0 - 46.0 %   MCV 81.5 80.0 - 100.0 fL   MCH 26.6 26.0 - 34.0 pg   MCHC 32.6 30.0 - 36.0 g/dL   RDW 12.3 11.5 - 15.5 %   Platelets 190 150 - 400 K/uL   nRBC 0.0 0.0 - 0.2 %    Comment: Performed at [facility], [address] [place], [place]  [address]  Urinalysis, Complete w Microscopic     Status: Abnormal   Collection Time: 04/09/19  4:15 PM  Result Value Ref Range   Color, Urine STRAW (A) YELLOW   APPearance CLOUDY (A) CLEAR   Specific Gravity, Urine 1.010 1.005 - 1.030   pH 5.0 5.0 - 8.0   Glucose, UA >=500 (A) NEGATIVE mg/dL   Hgb urine dipstick SMALL (A) NEGATIVE   Bilirubin Urine NEGATIVE NEGATIVE   Ketones, ur NEGATIVE NEGATIVE mg/dL   Protein, ur 100 (A) NEGATIVE mg/dL   Nitrite NEGATIVE NEGATIVE   Leukocytes,Ua SMALL (A) NEGATIVE   RBC / HPF 0-5 0 - 5 RBC/hpf   WBC, UA 6-10 0 - 5 WBC/hpf   Bacteria, UA FEW (A) NONE SEEN   Squamous Epithelial / LPF 0-5 0 - 5    Comment: Performed at [facility], [address] [place], [place] [address]  Blood gas, venous     Status: Abnormal (Preliminary result)   Collection Time: 04/09/19  4:15 PM  Result Value Ref Range   pH, Ven 7.35 7.250 - 7.430   pCO2, Ven 54 44.0 - 60.0 mmHg   pO2, Ven PENDING 32.0 - 45.0 mmHg   Bicarbonate 29.8 (H) 20.0 - 28.0 mmol/L   Acid-Base Excess 2.9 (H) 0.0 - 2.0 mmol/L   O2 Saturation 38.9 %   Patient temperature 37.0    Collection site VEIN    Sample type VENOUS     Comment: Performed at [facility], [address] [place], [place] [address]  Lipase, blood     Status: Abnormal   Collection Time: 04/09/19  4:15 PM  Result Value Ref Range   Lipase 54 (H) 11 - 51 U/L    Comment: Performed at [facility], [address] [place], [place] [address]  Hepatic function panel     Status: Abnormal   Collection Time: 04/09/19  4:15 PM  Result Value Ref Range   Total Protein 7.9 6.5 - 8.1 g/dL   Albumin 3.8 3.5 - 5.0 g/dL   AST 13 (L) 15 - 41 U/L   ALT 12 0 - 44 U/L   Alkaline Phosphatase 98 38 - 126 U/L   Total Bilirubin 0.8 0.3 - 1.2 mg/dL   Bilirubin, Direct 0.1 0.0 - 0.2 mg/dL   Indirect Bilirubin 0.7 0.3 - 0.9 mg/dL    Comment: Performed  at [facility], [address] [place], [place]  [address]  Glucose, capillary     Status: Abnormal   Collection Time: 04/09/19  8:34 PM  Result Value Ref Range   Glucose-Capillary 478 (H) 70 - 99 mg/dL  Glucose, capillary     Status: Abnormal   Collection Time: 04/09/19  9:59 PM  Result Value Ref Range   Glucose-Capillary 465 (H) 70 - 99 mg/dL   Dg Chest Portable 1 View  Result Date: 04/09/2019 CLINICAL DATA:  Cough EXAM: PORTABLE CHEST 1 VIEW COMPARISON:  August 17, 2018. FINDINGS: The heart size and mediastinal contours are within normal limits. Both lungs are clear. The visualized skeletal structures are unremarkable. IMPRESSION: No active disease. Electronically Signed   By: Constance Holster M.D.   On: 04/09/2019 17:14   US Abdomen Limited Ruq  Result Date: 04/09/2019 CLINICAL DATA:  Right upper quadrant abdominal pain, vomiting EXAM: ULTRASOUND ABDOMEN LIMITED RIGHT UPPER QUADRANT COMPARISON:  12/02/2016 FINDINGS: Gallbladder: There are multiple echogenic, shadowing stones within the gallbladder body and neck. Gallbladder is mildly dilated. No wall thickening visualized. No sonographic Murphy sign noted by sonographer. Common bile duct: Diameter: 4 mm Liver: No focal lesion identified. Mildly increased hepatic parenchymal echogenicity. Portal vein is patent on color Doppler imaging with normal direction of blood flow towards the liver. Other: None. IMPRESSION: 1. Cholelithiasis with numerous gallstones in the region of the gallbladder neck. Gallbladder is mildly dilated, however there is no focal gallbladder wall thickening and no reported sonographic Murphy's sign. Consider nuclear medicine hepatobiliary scan if clinical suspicion for acute cholecystitis remains high. 2. The echogenicity of the liver is increased. This is a nonspecific finding but is most commonly seen with fatty infiltration of the liver. There are no obvious focal liver lesions. Electronically Signed   By: Davina Poke M.D.   On: 04/09/2019 18:26    Pending  Labs Unresulted Labs (From admission, onward)    Start     Ordered   04/09/19 2202  SARS CORONAVIRUS 2 (TAT 6-24 HRS) Nasopharyngeal Nasopharyngeal Swab  (Asymptomatic/Tier 2 Patients Labs)  Once,   STAT    Question Answer Comment  Is this test for diagnosis or screening Screening   Symptomatic for COVID-19 as defined by CDC No   Hospitalized for COVID-19 No   Admitted to ICU for COVID-19 No   Previously tested for COVID-19 Yes   Resident in a congregate (group) care setting No   Employed in healthcare setting No   Pregnant No      04/09/19 2202   Signed and Held  Basic metabolic panel  Tomorrow morning,   R     Signed and Held   Signed and Held  CBC  Tomorrow morning,   R     Signed and Held   Signed and Held  Hemoglobin A1c  Tomorrow morning,   R     Signed and Held          Vitals/Pain Today's Vitals   04/09/19 1730 04/09/19 1800 04/09/19 2200 04/09/19 2318  BP: 138/80 131/82 129/78 128/72  Pulse: 76 75 81 94  Resp:   18 18  Temp:      TempSrc:      SpO2: 91% 95% 100% 98%  Weight:      Height:      PainSc:   Asleep 0-No pain    Isolation Precautions No active isolations  Medications Medications  Insulin Glargine (LANTUS) Solostar Pen 65 Units (has no administration  in time range)  sodium chloride 0.9 % bolus 1,000 mL (0 mLs Intravenous Stopped 04/09/19 1739)  sodium chloride 0.9 % bolus 1,000 mL (0 mLs Intravenous Stopped 04/09/19 2154)  diphenhydrAMINE (BENADRYL) injection 25 mg (25 mg Intravenous Given 04/09/19 1704)  metoCLOPramide (REGLAN) injection 10 mg (10 mg Intravenous Given 04/09/19 1704)  insulin aspart (novoLOG) injection 10 Units (10 Units Intravenous Given 04/09/19 1739)  sodium chloride 0.9 % bolus 1,000 mL (0 mLs Intravenous Stopped 04/09/19 2154)  insulin regular (NOVOLIN R) 100 units/mL injection 10 Units (10 Units Subcutaneous Given 04/09/19 2316)    Mobility walks Low fall risk   Focused Assessments Cardiac Assessment Handoff:    Lab  Results  Component Value Date   CKTOTAL 49 12/28/2009   CKMB 0.6 12/28/2009   TROPONINI <0.30 04/21/2013   Lab Results  Component Value Date   DDIMER <0.27 04/21/2013   Does the Patient currently have chest pain? No     R Recommendations: See Admitting Provider Note  Report given to:  Marcella RN   Additional Notes:

## 2019-04-09 NOTE — ED Triage Notes (Signed)
Pt here for vomiting since yesterday, is diabetic.  Pt reports called EMS yesterday because sugar read "high" on machine and EMS came and told her that she should stay home because there was covid at the hospitals and she is diabetic.  Unable to keep liquids down.  Has been using her insulin and reports at home sugar was 585 today.  No diarrhea or fevers.

## 2019-04-09 NOTE — ED Notes (Signed)
Fluids were paused during [place] d/t being positional and not flowing with arm above head (positioned for [place]) - this accounts for the reason that the fluids have not finished

## 2019-04-09 NOTE — ED Notes (Signed)
Pt given turkey sandwich tray 

## 2019-04-09 NOTE — ED Provider Notes (Signed)
[facility] Emergency Department Provider Note  ____________________________________________   First MD Initiated Contact with Patient 04/09/19 1631     (approximate)  I have reviewed the triage vital signs and the nursing notes.   HISTORY  Chief Complaint Hyperglycemia    HPI [name] [name] is a 58 y.o. female with past medical history as below here with nausea, vomiting, generalized weakness.  The patient states that she ran out of her long-acting insulin over the weekend.  She states that around 48 hours later, she began to develop nausea, dry mouth, and increased urination.  She has begun vomiting.  She states that she has been trying to eat, but feels like she gets full very quickly then vomits.  She has had similar symptoms when she has not had insulin in the past.  She has not had any fevers or chills.  She has a chronic cough but states it is mildly worse over the last several days.  No fevers.  No chills.  No known COVID or her risk exposures.  Denies any ongoing abdominal pain, only intermittent epigastric pain after vomiting.  No chest pain.  No other recent medication changes.  She has actually continued to take her short acting insulin.  No other complaints.        Past Medical History:  Diagnosis Date  . Anemia   . Chronic hepatitis C without hepatic coma ([place]) 11/09/2016  . Diabetes mellitus   . Fibroids   . 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 Hypertension   . MRSA (methicillin resistant Staphylococcus aureus)   . Trichomonas   . VAGINITIS, BACTERIAL, RECURRENT 08/15/2007   Qualifier: Diagnosis of  By: Radene Ou MD, [place]      Patient Active Problem List   Diagnosis Date Noted  . Hyperglycemia 04/09/2019  . Snoring 03/13/2019  . Symptomatic anemia 08/17/2018  . Acute renal failure (ARF) ([place]) 08/17/2018  . Chronic cystitis 08/03/2018  . Diabetic neuropathy ([place]) 06/16/2017  . Non compliance w  medication regimen 04/12/2017  . Family history of colon cancer in mother 11/17/2016  . Dyspareunia in female 11/11/2016  . Screen for colon cancer 11/11/2016  . History of ovarian cyst 11/11/2016  . Chronic hepatitis C without hepatic coma ([place]) 11/09/2016  . Hyperlipidemia 10/07/2016  . Type 2 diabetes mellitus ([place]) 01/29/2014  . BREAST PAIN, BILATERAL 02/19/2010  . ANEMIA, IRON DEFICIENCY 10/03/2009  . LEUKOPENIA, MILD 09/19/2009  . CHEST PAIN UNSPECIFIED 09/19/2009  . LIPOMA 06/18/2009  . 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 TINEA PEDIS 05/07/2009  . SKIN LESION 05/07/2009  . COUGH 05/07/2009  . SUBSTANCE ABUSE, MULTIPLE 02/22/2007  . Essential hypertension 02/22/2007    Past Surgical History:  Procedure Laterality Date  . BREAST BIOPSY Left 2018  . CESAREAN SECTION     breech    Prior to Admission medications   Medication Sig Start Date End Date Taking? Authorizing Provider  albuterol (VENTOLIN HFA) 108 (90 Base) MCG/ACT inhaler Inhale 2 puffs into the lungs every 6 (six) hours as needed for wheezing or shortness of breath. 01/29/19  Yes [name], MD  amLODipine (NORVASC) 10 MG tablet Take 1 tablet (10 mg total) by mouth daily. 01/29/19  Yes [name], MD  atorvastatin (LIPITOR) 40 MG tablet Take 1 tablet (40 mg total) by mouth daily. 01/29/19  Yes [name], MD  Insulin Glargine (LANTUS SOLOSTAR) 100 UNIT/ML Solostar Pen Inject 65 Units into the skin  at bedtime. 01/29/19  Yes Newlin, Charlane Ferretti, MD  liraglutide (VICTOZA) 18 MG/3ML SOPN Inject 0.3 mLs (1.8 mg total) into the skin daily with breakfast. 01/29/19  Yes Newlin, Enobong, MD  lisinopril-hydrochlorothiazide (ZESTORETIC) 20-25 MG tablet Take 1 tablet by mouth daily. 02/08/19  Yes Minus Breeding, MD  LYRICA 75 MG capsule Take 1 capsule (75 mg total) by mouth 2 (two) times daily. 03/28/19  Yes [name], MD    Allergies Patient has no known allergies.  Family History  Problem Relation Age of  Onset  . Colon cancer Mother   . Liver disease Sister   . Other Neg Hx   . Breast cancer Neg Hx   . Esophageal cancer Neg Hx   . Rectal cancer Neg Hx     Social History Social History   Tobacco Use  . Smoking status: Never Smoker  . Smokeless tobacco: Never Used  Substance Use Topics  . Alcohol use: No  . Drug use: No    Types: Cocaine, Marijuana    Comment: remote h/o cocaine and marijuana use    Review of Systems  Review of Systems  Constitutional: Positive for fatigue. Negative for fever.  HENT: Negative for congestion and sore throat.   Eyes: Negative for visual disturbance.  Respiratory: Negative for cough and shortness of breath.   Cardiovascular: Negative for chest pain.  Gastrointestinal: Positive for nausea and vomiting. Negative for abdominal pain and diarrhea.  Endocrine: Positive for polydipsia and polyuria.  Genitourinary: Negative for flank pain.  Musculoskeletal: Negative for back pain and neck pain.  Skin: Negative for rash and wound.  Neurological: Positive for weakness.  All other systems reviewed and are negative.    ____________________________________________  PHYSICAL EXAM:      VITAL SIGNS: ED Triage Vitals [04/09/19 1541]  Enc Vitals Group     BP 133/68     Pulse Rate 81     Resp 20     Temp 97.9 F (36.6 C)     Temp Source Oral     SpO2 97 %     Weight 240 lb (108.9 kg)     Height 5\' 4"  (1.626 m)     Head Circumference      Peak Flow      Pain Score 8     Pain Loc      Pain Edu?      Excl. in [place]?      Physical Exam Vitals signs and nursing note reviewed.  Constitutional:      General: She is not in acute distress.    Appearance: She is well-developed.  HENT:     Head: Normocephalic and atraumatic.     Mouth/Throat:     Mouth: Mucous membranes are dry.  Eyes:     Conjunctiva/sclera: Conjunctivae normal.  Neck:     Musculoskeletal: Neck supple.  Cardiovascular:     Rate and Rhythm: Normal rate and regular rhythm.      Heart sounds: Normal heart sounds. No murmur. No friction rub.  Pulmonary:     Effort: Pulmonary effort is normal. No respiratory distress.     Breath sounds: Normal breath sounds. No wheezing or rales.  Abdominal:     General: There is no distension.     Palpations: Abdomen is soft.     Tenderness: There is no abdominal tenderness.  Skin:    General: Skin is warm.     Capillary Refill: Capillary refill takes less than 2 seconds.  Neurological:  Mental Status: She is alert and oriented to person, place, and time.     Motor: No abnormal muscle tone.       ____________________________________________   LABS (all labs ordered are listed, but only abnormal results are displayed)  Labs Reviewed  BASIC METABOLIC PANEL - Abnormal; Notable for the following components:      Result Value   Sodium 126 (*)    Chloride 87 (*)    Glucose, Bld 584 (*)    BUN 39 (*)    Creatinine, Ser 2.15 (*)    GFR calc non Af Amer 25 (*)    GFR calc Af Amer 29 (*)    All other components within normal limits  CBC - Abnormal; Notable for the following components:   WBC 3.6 (*)    Hemoglobin 10.8 (*)    HCT 33.1 (*)    All other components within normal limits  URINALYSIS, COMPLETE (UACMP) WITH MICROSCOPIC - Abnormal; Notable for the following components:   Color, Urine STRAW (*)    APPearance CLOUDY (*)    Glucose, UA >=500 (*)    Hgb urine dipstick SMALL (*)    Protein, ur 100 (*)    Leukocytes,Ua SMALL (*)    Bacteria, UA FEW (*)    All other components within normal limits  BLOOD GAS, VENOUS - Abnormal; Notable for the following components:   Bicarbonate 29.8 (*)    Acid-Base Excess 2.9 (*)    All other components within normal limits  GLUCOSE, CAPILLARY - Abnormal; Notable for the following components:   Glucose-Capillary 578 (*)    All other components within normal limits  LIPASE, BLOOD - Abnormal; Notable for the following components:   Lipase 54 (*)    All other components  within normal limits  HEPATIC FUNCTION PANEL - Abnormal; Notable for the following components:   AST 13 (*)    All other components within normal limits  GLUCOSE, CAPILLARY - Abnormal; Notable for the following components:   Glucose-Capillary 478 (*)    All other components within normal limits  GLUCOSE, CAPILLARY - Abnormal; Notable for the following components:   Glucose-Capillary 465 (*)    All other components within normal limits  SARS CORONAVIRUS 2 (TAT 6-24 HRS)  BASIC METABOLIC PANEL  CBC  HEMOGLOBIN A1C  CBG MONITORING, ED  CBG MONITORING, ED    ____________________________________________  EKG:   ________________________________________  RADIOLOGY All imaging, including plain films, CT scans, and ultrasounds, independently reviewed by me, and interpretations confirmed via formal radiology reads.  ED MD interpretation:   Cxr: Clear [place] RUQ: Cholelithiasis, no acute cholecystitis  Official radiology report(s): Dg Chest Portable 1 View  Result Date: 04/09/2019 CLINICAL DATA:  Cough EXAM: PORTABLE CHEST 1 VIEW COMPARISON:  August 17, 2018. FINDINGS: The heart size and mediastinal contours are within normal limits. Both lungs are clear. The visualized skeletal structures are unremarkable. IMPRESSION: No active disease. Electronically Signed   By: Constance Holster M.D.   On: 04/09/2019 17:14   US Abdomen Limited Ruq  Result Date: 04/09/2019 CLINICAL DATA:  Right upper quadrant abdominal pain, vomiting EXAM: ULTRASOUND ABDOMEN LIMITED RIGHT UPPER QUADRANT COMPARISON:  12/02/2016 FINDINGS: Gallbladder: There are multiple echogenic, shadowing stones within the gallbladder body and neck. Gallbladder is mildly dilated. No wall thickening visualized. No sonographic Murphy sign noted by sonographer. Common bile duct: Diameter: 4 mm Liver: No focal lesion identified. Mildly increased hepatic parenchymal echogenicity. Portal vein is patent on color Doppler imaging with normal  direction of blood flow towards the liver. Other: None. IMPRESSION: 1. Cholelithiasis with numerous gallstones in the region of the gallbladder neck. Gallbladder is mildly dilated, however there is no focal gallbladder wall thickening and no reported sonographic Murphy's sign. Consider nuclear medicine hepatobiliary scan if clinical suspicion for acute cholecystitis remains high. 2. The echogenicity of the liver is increased. This is a nonspecific finding but is most commonly seen with fatty infiltration of the liver. There are no obvious focal liver lesions. Electronically Signed   By: Davina Poke M.D.   On: 04/09/2019 18:26    ____________________________________________  PROCEDURES   Procedure(s) performed (including Critical Care):  Procedures  ____________________________________________  INITIAL IMPRESSION / MDM / [place] / ED COURSE  As part of my medical decision making, I reviewed the following data within the electronic MEDICAL RECORD NUMBER Notes from prior ED visits and [place] Controlled Substance Database      *Dhana Totton was evaluated in Emergency Department on 04/10/2019 for the symptoms described in the history of present illness. She was evaluated in the context of the global COVID-19 pandemic, which necessitated consideration that the patient might be at risk for infection with the SARS-CoV-2 virus that causes COVID-19. Institutional protocols and algorithms that pertain to the evaluation of patients at risk for COVID-19 are in a state of rapid change based on information released by regulatory bodies including the CDC and federal and state organizations. These policies and algorithms were followed during the patient's care in the ED.  Some ED evaluations and interventions may be delayed as a result of limited staffing during the pandemic.*      Medical Decision Making: 58 yo F here with hyperglycemia, nausea, vomiting. Labs show marked hyperglycemia, AKI. CO2  and AG normal, doubt DKA. Cholelithiasis noted but no RUQ TTP on exam. Will start fluids, insulin, admit.  ____________________________________________  FINAL CLINICAL IMPRESSION(S) / ED DIAGNOSES  Final diagnoses:  RUQ pain  Acute renal failure superimposed on chronic kidney disease, unspecified CKD stage, unspecified acute renal failure type ([place])  Hyperglycemia     MEDICATIONS GIVEN DURING THIS VISIT:  Medications  amLODipine (NORVASC) tablet 10 mg (has no administration in time range)  atorvastatin (LIPITOR) tablet 40 mg (has no administration in time range)  Insulin Glargine (LANTUS) Solostar Pen 65 Units (has no administration in time range)  pregabalin (LYRICA) capsule 75 mg (75 mg Oral Given 04/10/19 0043)  albuterol (PROVENTIL) (2.5 MG/3ML) 0.083% nebulizer solution 5 mg (has no administration in time range)  enoxaparin (LOVENOX) injection 40 mg (has no administration in time range)  0.9 %  sodium chloride infusion ( Intravenous New Bag/Given 04/10/19 0010)  acetaminophen (TYLENOL) tablet 650 mg (has no administration in time range)    Or  acetaminophen (TYLENOL) suppository 650 mg (has no administration in time range)  polyethylene glycol (MIRALAX / GLYCOLAX) packet 17 g (has no administration in time range)  ondansetron (ZOFRAN) tablet 4 mg (has no administration in time range)    Or  ondansetron (ZOFRAN) injection 4 mg (has no administration in time range)  insulin aspart (novoLOG) injection 0-20 Units (has no administration in time range)  insulin aspart (novoLOG) injection 0-5 Units ( Subcutaneous Canceled Entry 04/09/19 2355)  pantoprazole (PROTONIX) EC tablet 40 mg (has no administration in time range)  hydrALAZINE (APRESOLINE) injection 5 mg (has no administration in time range)  influenza vaccine adjuvanted (FLUAD) injection 0.5 mL (has no administration in time range)  sodium chloride 0.9 % bolus 1,000 mL (  0 mLs Intravenous Stopped 04/09/19 1739)  sodium chloride  0.9 % bolus 1,000 mL (0 mLs Intravenous Stopped 04/09/19 2154)  diphenhydrAMINE (BENADRYL) injection 25 mg (25 mg Intravenous Given 04/09/19 1704)  metoCLOPramide (REGLAN) injection 10 mg (10 mg Intravenous Given 04/09/19 1704)  insulin aspart (novoLOG) injection 10 Units (10 Units Intravenous Given 04/09/19 1739)  sodium chloride 0.9 % bolus 1,000 mL (0 mLs Intravenous Stopped 04/09/19 2154)  insulin regular (NOVOLIN R) 100 units/mL injection 10 Units (10 Units Subcutaneous Given 04/09/19 2316)     ED Discharge Orders    None       Note:  This document was prepared using Dragon voice recognition software and may include unintentional dictation errors.   Duffy Bruce, MD 04/10/19 [phone]

## 2019-04-09 NOTE — ED Notes (Signed)
Patient transported to Ultrasound 

## 2019-04-10 ENCOUNTER — Other Ambulatory Visit: Payer: Self-pay

## 2019-04-10 LAB — BASIC METABOLIC PANEL
Anion gap: 7 (ref 5–15)
BUN: 39 mg/dL — ABNORMAL HIGH (ref 6–20)
CO2: 25 mmol/L (ref 22–32)
Calcium: 8 mg/dL — ABNORMAL LOW (ref 8.9–10.3)
Chloride: 103 mmol/L (ref 98–111)
Creatinine, Ser: 2.01 mg/dL — ABNORMAL HIGH (ref 0.44–1.00)
GFR calc Af Amer: 31 mL/min — ABNORMAL LOW (ref >60)
GFR calc non Af Amer: 27 mL/min — ABNORMAL LOW (ref >60)
Glucose, Bld: 319 mg/dL — ABNORMAL HIGH (ref 70–99)
Potassium: 3.4 mmol/L — ABNORMAL LOW (ref 3.5–5.1)
Sodium: 135 mmol/L (ref 135–145)

## 2019-04-10 LAB — GLUCOSE, CAPILLARY
Glucose-Capillary: 473 mg/dL — ABNORMAL HIGH (ref 70–99)
Glucose-Capillary: 391 mg/dL — ABNORMAL HIGH (ref 70–99)
Glucose-Capillary: 257 mg/dL — ABNORMAL HIGH (ref 70–99)
Glucose-Capillary: 377 mg/dL — ABNORMAL HIGH (ref 70–99)
Glucose-Capillary: 369 mg/dL — ABNORMAL HIGH (ref 70–99)
Glucose-Capillary: 239 mg/dL — ABNORMAL HIGH (ref 70–99)

## 2019-04-10 LAB — CBC
HCT: 27.9 % — ABNORMAL LOW (ref 36.0–46.0)
Hemoglobin: 9.1 g/dL — ABNORMAL LOW (ref 12.0–15.0)
MCH: 26.8 pg (ref 26.0–34.0)
MCHC: 32.6 g/dL (ref 30.0–36.0)
MCV: 82.3 fL (ref 80.0–100.0)
Platelets: 173 10*3/uL (ref 150–400)
RBC: 3.39 MIL/uL — ABNORMAL LOW (ref 3.87–5.11)
RDW: 12.3 % (ref 11.5–15.5)
WBC: 3.9 10*3/uL — ABNORMAL LOW (ref 4.0–10.5)
nRBC: 0 % (ref 0.0–0.2)

## 2019-04-10 LAB — SARS CORONAVIRUS 2 (TAT 6-24 HRS): SARS Coronavirus 2: NEGATIVE

## 2019-04-10 MED ORDER — POTASSIUM CHLORIDE CRYS ER 20 MEQ PO TBCR
20.0000 meq | EXTENDED_RELEASE_TABLET | Freq: Once | ORAL | Status: AC
  Administered 2019-04-10: 20 meq via ORAL
  Filled 2019-04-10: qty 1

## 2019-04-10 MED ORDER — INSULIN GLARGINE 100 UNIT/ML ~~LOC~~ SOLN
55.0000 [IU] | Freq: Every day | SUBCUTANEOUS | Status: DC
  Administered 2019-04-10: 55 [IU] via SUBCUTANEOUS
  Filled 2019-04-10 (×2): qty 0.55

## 2019-04-10 MED ORDER — INFLUENZA VAC A&B SA ADJ QUAD 0.5 ML IM PRSY
0.5000 mL | PREFILLED_SYRINGE | INTRAMUSCULAR | Status: AC
  Administered 2019-04-12: 0.5 mL via INTRAMUSCULAR
  Filled 2019-04-10 (×2): qty 0.5

## 2019-04-10 MED ORDER — INSULIN GLARGINE 100 UNIT/ML ~~LOC~~ SOLN
40.0000 [IU] | Freq: Every day | SUBCUTANEOUS | Status: DC
  Filled 2019-04-10: qty 0.4

## 2019-04-10 MED ORDER — LIVING WELL WITH DIABETES BOOK
Freq: Once | Status: DC
  Filled 2019-04-10: qty 1

## 2019-04-10 MED ORDER — INSULIN ASPART 100 UNIT/ML ~~LOC~~ SOLN
20.0000 [IU] | Freq: Once | SUBCUTANEOUS | Status: AC
  Administered 2019-04-10: 03:00:00 20 [IU] via SUBCUTANEOUS
  Filled 2019-04-10: qty 1

## 2019-04-10 MED ORDER — INSULIN ASPART 100 UNIT/ML ~~LOC~~ SOLN
5.0000 [IU] | Freq: Three times a day (TID) | SUBCUTANEOUS | Status: DC
  Administered 2019-04-10 – 2019-04-11 (×4): 5 [IU] via SUBCUTANEOUS
  Filled 2019-04-10 (×4): qty 1

## 2019-04-10 NOTE — Progress Notes (Signed)
[place] at [facility]                                                                                                                                                                                  Patient Demographics   [name] [name], is a 58 y.o. female, DOB - 07-Mar-1961, LTJ:[id number]  Admit date - 04/09/2019   Admitting Physician Sela Hua, MD  Outpatient Primary MD for the patient is [name], MD   LOS - 1  Subjective:  Patient states that she still having some abdominal symptoms with nausea.  Blood sugars are still elevated.  She denies any fevers or chills.   Review of Systems:   CONSTITUTIONAL: No documented fever. No fatigue, weakness. No weight gain, no weight loss.  EYES: No blurry or double vision.  ENT: No tinnitus. No postnasal drip. No redness of the oropharynx.  RESPIRATORY: No cough, no wheeze, no hemoptysis. No dyspnea.  CARDIOVASCULAR: No chest pain. No orthopnea. No palpitations. No syncope.  GASTROINTESTINAL: Positive nausea, no vomiting or diarrhea. No abdominal pain. No melena or hematochezia.  GENITOURINARY: No dysuria or hematuria.  ENDOCRINE: No polyuria or nocturia. No heat or cold intolerance.  HEMATOLOGY: No anemia. No bruising. No bleeding.  INTEGUMENTARY: No rashes. No lesions.  MUSCULOSKELETAL: No arthritis. No swelling. No gout.  NEUROLOGIC: No numbness, tingling, or ataxia. No seizure-type activity.  PSYCHIATRIC: No anxiety. No insomnia. No ADD.    Vitals:   Vitals:   04/09/19 2318 04/09/19 2355 04/10/19 0554 04/10/19 1136  BP: 128/72 133/83 102/61 128/75  Pulse: 94 96 85 90  Resp: 18 16 20 17   Temp:  (!) 97.5 F (36.4 C) 98.5 F (36.9 C) 98.5 F (36.9 C)  TempSrc:  Oral Oral Oral  SpO2: 98% 96% 99% 95%  Weight:  104.5 kg    Height:  5\' 6"  (1.676 m)      Wt Readings from Last 3 Encounters:  04/09/19 104.5 kg  03/13/19 108.4 kg  02/24/19 108.4 kg     Intake/Output Summary (Last 24  hours) at 04/10/2019 1444 Last data filed at 04/10/2019 1423 Gross per 24 hour  Intake 4247.79 ml  Output 500 ml  Net 3747.79 ml    Physical Exam:   GENERAL: Pleasant-appearing in no apparent distress.  HEAD, EYES, EARS, NOSE AND THROAT: Atraumatic, normocephalic. Extraocular muscles are intact. Pupils equal and reactive to light. Sclerae anicteric. No conjunctival injection. No oro-pharyngeal erythema.  NECK: Supple. There is no jugular venous distention. No bruits, no lymphadenopathy, no thyromegaly.  HEART: Regular rate and rhythm,. No murmurs, no rubs, no clicks.  LUNGS: Clear to auscultation bilaterally. No rales or rhonchi.  No wheezes.  ABDOMEN: Soft, flat, nontender, nondistended. Has good bowel sounds. No hepatosplenomegaly appreciated.  EXTREMITIES: No evidence of any cyanosis, clubbing, or peripheral edema.  +2 pedal and radial pulses bilaterally.  NEUROLOGIC: The patient is alert, awake, and oriented x3 with no focal motor or sensory deficits appreciated bilaterally.  SKIN: Moist and warm with no rashes appreciated.  Psych: Not anxious, depressed LN: No inguinal LN enlargement    Antibiotics   Anti-infectives (From admission, onward)   None      Medications   Scheduled Meds: . amLODipine  10 mg Oral Daily  . atorvastatin  40 mg Oral Daily  . enoxaparin (LOVENOX) injection  40 mg Subcutaneous Q24H  . [START ON 04/11/2019] influenza vaccine adjuvanted  0.5 mL Intramuscular Tomorrow-1000  . insulin aspart  0-20 Units Subcutaneous TID WC  . insulin aspart  0-5 Units Subcutaneous QHS  . insulin aspart  5 Units Subcutaneous TID WC  . insulin glargine  55 Units Subcutaneous Daily  . living well with diabetes book   Does not apply Once  . pantoprazole  40 mg Oral BID AC  . potassium chloride  20 mEq Oral Once  . pregabalin  75 mg Oral BID   Continuous Infusions: . sodium chloride Stopped (04/10/19 0636)   PRN Meds:.acetaminophen **OR** acetaminophen, albuterol,  hydrALAZINE, ondansetron **OR** ondansetron (ZOFRAN) IV, polyethylene glycol   Data Review:   Micro Results No results found for this or any previous visit (from the past 240 hour(s)).  Radiology Reports Dg Chest Portable 1 View  Result Date: 04/09/2019 CLINICAL DATA:  Cough EXAM: PORTABLE CHEST 1 VIEW COMPARISON:  August 17, 2018. FINDINGS: The heart size and mediastinal contours are within normal limits. Both lungs are clear. The visualized skeletal structures are unremarkable. IMPRESSION: No active disease. Electronically Signed   By: Constance Holster M.D.   On: 04/09/2019 17:14   Mm 3d Screen Breast Bilateral  Result Date: 03/20/2019 CLINICAL DATA:  Screening. EXAM: DIGITAL SCREENING BILATERAL MAMMOGRAM WITH TOMO AND CAD COMPARISON:  Previous exam(s). ACR Breast Density Category b: There are scattered areas of fibroglandular density. FINDINGS: There are no findings suspicious for malignancy. Images were processed with CAD. IMPRESSION: No mammographic evidence of malignancy. A result letter of this screening mammogram will be mailed directly to the patient. RECOMMENDATION: Screening mammogram in one year. (Code:SM-B-01Y) BI-RADS CATEGORY  1: Negative. Electronically Signed   By: Abelardo Diesel M.D.   On: 03/20/2019 13:41   US Abdomen Limited Ruq  Result Date: 04/09/2019 CLINICAL DATA:  Right upper quadrant abdominal pain, vomiting EXAM: ULTRASOUND ABDOMEN LIMITED RIGHT UPPER QUADRANT COMPARISON:  12/02/2016 FINDINGS: Gallbladder: There are multiple echogenic, shadowing stones within the gallbladder body and neck. Gallbladder is mildly dilated. No wall thickening visualized. No sonographic Murphy sign noted by sonographer. Common bile duct: Diameter: 4 mm Liver: No focal lesion identified. Mildly increased hepatic parenchymal echogenicity. Portal vein is patent on color Doppler imaging with normal direction of blood flow towards the liver. Other: None. IMPRESSION: 1. Cholelithiasis with  numerous gallstones in the region of the gallbladder neck. Gallbladder is mildly dilated, however there is no focal gallbladder wall thickening and no reported sonographic Murphy's sign. Consider nuclear medicine hepatobiliary scan if clinical suspicion for acute cholecystitis remains high. 2. The echogenicity of the liver is increased. This is a nonspecific finding but is most commonly seen with fatty infiltration of the liver. There are no obvious focal liver lesions. Electronically Signed   By: Marisue Brooklyn.D.  On: 04/09/2019 18:26     CBC Recent Labs  Lab 04/09/19 1615 04/10/19 0518  WBC 3.6* 3.9*  HGB 10.8* 9.1*  HCT 33.1* 27.9*  PLT 190 173  MCV 81.5 82.3  MCH 26.6 26.8  MCHC 32.6 32.6  RDW 12.3 12.3    Chemistries  Recent Labs  Lab 04/09/19 1615 04/10/19 0518  NA 126* 135  K 3.6 3.4*  CL 87* 103  CO2 25 25  GLUCOSE 584* 319*  BUN 39* 39*  CREATININE 2.15* 2.01*  CALCIUM 9.0 8.0*  AST 13*  --   ALT 12  --   ALKPHOS 98  --   BILITOT 0.8  --    ------------------------------------------------------------------------------------------------------------------ estimated creatinine clearance is 37.3 mL/min (A) (by C-G formula based on SCr of 2.01 mg/dL (H)). ------------------------------------------------------------------------------------------------------------------ No results for input(s): HGBA1C in the last 72 hours. ------------------------------------------------------------------------------------------------------------------ No results for input(s): CHOL, HDL, LDLCALC, TRIG, CHOLHDL, LDLDIRECT in the last 72 hours. ------------------------------------------------------------------------------------------------------------------ No results for input(s): TSH, T4TOTAL, T3FREE, THYROIDAB in the last 72 hours.  Invalid input(s): FREET3 ------------------------------------------------------------------------------------------------------------------ No  results for input(s): VITAMINB12, FOLATE, FERRITIN, TIBC, IRON, RETICCTPCT in the last 72 hours.  Coagulation profile No results for input(s): INR, PROTIME in the last 168 hours.  No results for input(s): DDIMER in the last 72 hours.  Cardiac Enzymes No results for input(s): CKMB, TROPONINI, MYOGLOBIN in the last 168 hours.  Invalid input(s): CK ------------------------------------------------------------------------------------------------------------------ Invalid input(s): POCBNP    Assessment & Plan   Hyperglycemic hyperosmolar syndrome in uncontrolled type 2 diabetes- due to running out of her insulin about 1 week ago.   We will resume her insulin at 55 units. Also gave her pre-meal insulin.    AKI in CKD III- due to dehydration. -Holding home lisinopril-HCTZ -Improved with IV fluids -Avoid nephrotoxic agents  Nausea/vomiting/epigastric abdominal pain- likely due to hyperglycemia -IV antiemetics -Start Protonix bid -If symptoms persist may need HIDA scan  Cholelithiasis- asymptomatic.  Incidental finding on right upper quadrant abdominal ultrasound.  No tenderness to palpation of the right upper quadrant on exam. -Monitor -We will refer to surgery as outpatient  Hypertension- normotensive in the ED -Continue home norvasc   Hyperlipidemia-stable -Continue home Lipitor  Chronic normocytic anemia- hemoglobin at baseline -Monitoria      Code Status Orders  (From admission, onward)         Start     Ordered   04/09/19 2351  Full code  Continuous     04/09/19 2350        Code Status History    Date Active Date Inactive Code Status Order ID Comments User Context   08/17/2018 1324 08/19/2018 2121 Full Code [id number]  Karmen Bongo, MD ED   Advance Care Planning Activity           Consults none  DVT Prophylaxis  Lovenox   Lab Results  Component Value Date   PLT 173 04/10/2019     Time Spent in minutes   49min Greater than 50% of time  spent in care coordination and counseling patient regarding the condition and plan of care.   Dustin Flock M.D on 04/10/2019 at 2:44 PM  Between 7am to 6pm - Pager - [phone]  After 6pm go to [email or web address] - Proofreader  [employer]   Office  [phone]

## 2019-04-10 NOTE — Progress Notes (Signed)
Inpatient Diabetes Program Recommendations  AACE/ADA: New Consensus Statement on Inpatient Glycemic Control (2015)  Target Ranges:  Prepandial:   less than 140 mg/dL      Peak postprandial:   less than 180 mg/dL (1-2 hours)      Critically ill patients:  140 - 180 mg/dL   Lab Results  Component Value Date   GLUCAP 257 (H) 04/10/2019   HGBA1C 8.1 (A) 01/29/2019    Review of Glycemic Control Results for [name] [name], [name] [name] (MRN [id number]) as of 04/10/2019 10:04  Ref. Range 04/09/2019 15:47 04/09/2019 20:34 04/09/2019 21:59 04/10/2019 01:45 04/10/2019 04:13 04/10/2019 07:36  Glucose-Capillary Latest Ref Range: 70 - 99 mg/dL 578 (HH) 478 (H) 465 (H) 473 (H) 391 (H) 257 (H)   Diabetes history: DM2 Outpatient Diabetes medications: Lantus 65 units daily + Victoza 1.8 qd Current orders for Inpatient glycemic control: Lantus 40 units qd + Novolog resistant correction tid + 0-5 units hs  Inpatient Diabetes Program Recommendations:    -Start Lantus to start this am instead of waiting until hs -Novolog 5 units tid meal coverage if eats 50% Secure chat sent to Dr. Serita Grit.  Spoke with pt and explained what an A1C is, basic pathophysiology of DM Type 2, basic home care, basic diabetes diet nutrition principles, importance of checking CBGs and maintaining good CBG control to prevent long-term and short-term complications. Reviewed signs and symptoms of hyperglycemia and hypoglycemia and how to treat hypoglycemia at home. Also reviewed blood sugar goals at home.  RNs to provide ongoing basic DM education at bedside with this patient. Have ordered educational booklet. Patient states she ran out of insulin which she sometimes forgets to take and then the weekend came and was not able to get prescription from the pharmacy. Patient shared she would like to have her insulin delivered to her like her sister does. Requested patient to discuss with her pharmacy or Medicaid. Focused with patient on make sure she has  prescriptions and gets filled prior to the weekend and takes insulin and Victoza regularly. Patient states she sometimes forgets and starts her day without medications but patient does not work @ this time and willing to set her phone to alarm to remind her to take her medications. Plans to take Victoza in am and Lantus in pm. Reviewed with patient she may ask her physician what dose of insulin of Novolin 70/30 insulin mix she can buy from [place] should she run out in the future when pharmacy is closed.

## 2019-04-11 LAB — GLUCOSE, CAPILLARY
Glucose-Capillary: 475 mg/dL — ABNORMAL HIGH (ref 70–99)
Glucose-Capillary: 407 mg/dL — ABNORMAL HIGH (ref 70–99)
Glucose-Capillary: 407 mg/dL — ABNORMAL HIGH (ref 70–99)
Glucose-Capillary: 460 mg/dL — ABNORMAL HIGH (ref 70–99)
Glucose-Capillary: 393 mg/dL — ABNORMAL HIGH (ref 70–99)
Glucose-Capillary: 336 mg/dL — ABNORMAL HIGH (ref 70–99)
Glucose-Capillary: 77 mg/dL (ref 70–99)

## 2019-04-11 LAB — BASIC METABOLIC PANEL
Anion gap: 9 (ref 5–15)
BUN: 38 mg/dL — ABNORMAL HIGH (ref 6–20)
CO2: 25 mmol/L (ref 22–32)
Calcium: 8.4 mg/dL — ABNORMAL LOW (ref 8.9–10.3)
Chloride: 101 mmol/L (ref 98–111)
Creatinine, Ser: 1.94 mg/dL — ABNORMAL HIGH (ref 0.44–1.00)
GFR calc Af Amer: 32 mL/min — ABNORMAL LOW (ref >60)
GFR calc non Af Amer: 28 mL/min — ABNORMAL LOW (ref >60)
Glucose, Bld: 365 mg/dL — ABNORMAL HIGH (ref 70–99)
Potassium: 4 mmol/L (ref 3.5–5.1)
Sodium: 135 mmol/L (ref 135–145)

## 2019-04-11 LAB — HEMOGLOBIN A1C
Hgb A1c MFr Bld: 13.9 % — ABNORMAL HIGH (ref 4.8–5.6)
Mean Plasma Glucose: 352 mg/dL

## 2019-04-11 MED ORDER — INSULIN GLARGINE 100 UNIT/ML ~~LOC~~ SOLN
65.0000 [IU] | Freq: Every day | SUBCUTANEOUS | Status: DC
  Administered 2019-04-11 – 2019-04-12 (×2): 65 [IU] via SUBCUTANEOUS
  Filled 2019-04-11 (×2): qty 0.65

## 2019-04-11 MED ORDER — INSULIN GLARGINE 100 UNIT/ML ~~LOC~~ SOLN
20.0000 [IU] | Freq: Every day | SUBCUTANEOUS | Status: DC
  Administered 2019-04-11: 20 [IU] via SUBCUTANEOUS
  Filled 2019-04-11 (×2): qty 0.2

## 2019-04-11 MED ORDER — INSULIN ASPART 100 UNIT/ML ~~LOC~~ SOLN
10.0000 [IU] | Freq: Three times a day (TID) | SUBCUTANEOUS | Status: DC
  Administered 2019-04-11 – 2019-04-12 (×3): 10 [IU] via SUBCUTANEOUS
  Filled 2019-04-11 (×3): qty 1

## 2019-04-11 NOTE — Progress Notes (Signed)
Inpatient Diabetes Program Recommendations  AACE/ADA: New Consensus Statement on Inpatient Glycemic Control (2015)  Target Ranges:  Prepandial:   less than 140 mg/dL      Peak postprandial:   less than 180 mg/dL (1-2 hours)      Critically ill patients:  140 - 180 mg/dL   Lab Results  Component Value Date   GLUCAP 407 (H) 04/11/2019   HGBA1C 13.9 (H) 04/10/2019    Review of Glycemic Control  Diabetes history: DM2 Outpatient Diabetes medications: Lantus 65 units daily + Victoza 1.8 qd Current orders for Inpatient glycemic control: Lantus 65 units qd + Novolog 5 units tid MC +Novolog resistant correction tid + 0-5 units hs  Inpatient Diabetes Program Recommendations:   -Increase Novolog meal coverage to 10 units tid if eats 50%  Thank you, Bethena Roys E. Mikeala Girdler, RN, MSN, CDE  Diabetes Coordinator Inpatient Glycemic Control Team Team Pager [phone] (8am-5pm) 04/11/2019 9:38 AM

## 2019-04-11 NOTE — Progress Notes (Signed)
Dr. Brett Albino notified of lack of IVFs running with current IVF orders.  Acknowledged and OK to restart IVFs at current ordered rate. Barbaraann Faster, RN 9:20 PM 04/11/2019

## 2019-04-11 NOTE — Progress Notes (Addendum)
[place] at [facility]                                                                                                                                                                                  Patient Demographics   [name] [name], is a 58 y.o. female, DOB - 04-25-1961, IRS:[id number]  Admit date - 04/09/2019   Admitting Physician Sela Hua, MD  Outpatient Primary MD for the patient is [name], MD   LOS - 2  Subjective:  Blood sugars are continue to be elevated. In the 400 range. Patient reports diarrhea  Review of Systems:   CONSTITUTIONAL: No documented fever. No fatigue, weakness. No weight gain, no weight loss.  EYES: No blurry or double vision.  ENT: No tinnitus. No postnasal drip. No redness of the oropharynx.  RESPIRATORY: No cough, no wheeze, no hemoptysis. No dyspnea.  CARDIOVASCULAR: No chest pain. No orthopnea. No palpitations. No syncope.  GASTROINTESTINAL: Positive nausea, no vomiting or positive diarrhea. No abdominal pain. No melena or hematochezia.  GENITOURINARY: No dysuria or hematuria.  ENDOCRINE: No polyuria or nocturia. No heat or cold intolerance.  HEMATOLOGY: No anemia. No bruising. No bleeding.  INTEGUMENTARY: No rashes. No lesions.  MUSCULOSKELETAL: No arthritis. No swelling. No gout.  NEUROLOGIC: No numbness, tingling, or ataxia. No seizure-type activity.  PSYCHIATRIC: No anxiety. No insomnia. No ADD.    Vitals:   Vitals:   04/10/19 1136 04/10/19 2114 04/11/19 0531 04/11/19 1249  BP: 128/75 (!) 154/92 122/74 (!) 163/96  Pulse: 90 98 83   Resp: 17 20 18    Temp: 98.5 F (36.9 C) 98.5 F (36.9 C) 98 F (36.7 C)   TempSrc: Oral Oral Oral   SpO2: 95% 98% 96%   Weight:      Height:        Wt Readings from Last 3 Encounters:  04/09/19 104.5 kg  03/13/19 108.4 kg  02/24/19 108.4 kg     Intake/Output Summary (Last 24 hours) at 04/11/2019 1418 Last data filed at 04/11/2019 0900 Gross per 24 hour   Intake 1327.33 ml  Output 900 ml  Net 427.33 ml    Physical Exam:   GENERAL: Pleasant-appearing in no apparent distress.  HEAD, EYES, EARS, NOSE AND THROAT: Atraumatic, normocephalic. Extraocular muscles are intact. Pupils equal and reactive to light. Sclerae anicteric. No conjunctival injection. No oro-pharyngeal erythema.  NECK: Supple. There is no jugular venous distention. No bruits, no lymphadenopathy, no thyromegaly.  HEART: Regular rate and rhythm,. No murmurs, no rubs, no clicks.  LUNGS: Clear to auscultation bilaterally. No rales or rhonchi. No wheezes.  ABDOMEN: Soft, flat, nontender, nondistended. Has good bowel sounds. No  hepatosplenomegaly appreciated.  EXTREMITIES: No evidence of any cyanosis, clubbing, or peripheral edema.  +2 pedal and radial pulses bilaterally.  NEUROLOGIC: The patient is alert, awake, and oriented x3 with no focal motor or sensory deficits appreciated bilaterally.  SKIN: Moist and warm with no rashes appreciated.  Psych: Not anxious, depressed LN: No inguinal LN enlargement    Antibiotics   Anti-infectives (From admission, onward)   None      Medications   Scheduled Meds: . amLODipine  10 mg Oral Daily  . atorvastatin  40 mg Oral Daily  . enoxaparin (LOVENOX) injection  40 mg Subcutaneous Q24H  . influenza vaccine adjuvanted  0.5 mL Intramuscular Tomorrow-1000  . insulin aspart  0-20 Units Subcutaneous TID WC  . insulin aspart  0-5 Units Subcutaneous QHS  . insulin aspart  10 Units Subcutaneous TID WC  . insulin glargine  65 Units Subcutaneous Daily  . living well with diabetes book   Does not apply Once  . pantoprazole  40 mg Oral BID AC  . pregabalin  75 mg Oral BID   Continuous Infusions: . sodium chloride Stopped (04/10/19 1005)   PRN Meds:.acetaminophen **OR** acetaminophen, albuterol, hydrALAZINE, ondansetron **OR** ondansetron (ZOFRAN) IV   Data Review:   Micro Results Recent Results (from the past 240 hour(s))  SARS  CORONAVIRUS 2 (TAT 6-24 HRS) Nasopharyngeal Nasopharyngeal Swab     Status: None   Collection Time: 04/09/19 10:04 PM   Specimen: Nasopharyngeal Swab  Result Value Ref Range Status   SARS Coronavirus 2 NEGATIVE NEGATIVE Final    Comment: (NOTE) SARS-CoV-2 target nucleic acids are NOT DETECTED. The SARS-CoV-2 RNA is generally detectable in upper and lower respiratory specimens during the acute phase of infection. Negative results do not preclude SARS-CoV-2 infection, do not rule out co-infections with other pathogens, and should not be used as the sole basis for treatment or other patient management decisions. Negative results must be combined with clinical observations, patient history, and epidemiological information. The expected result is Negative. Fact Sheet for Patients: SugarRoll.be Fact Sheet for Healthcare Providers: https://www.woods-mathews.com/ This test is not yet approved or cleared by the [place] FDA and  has been authorized for detection and/or diagnosis of SARS-CoV-2 by FDA under an Emergency Use Authorization (EUA). This EUA will remain  in effect (meaning this test can be used) for the duration of the COVID-19 declaration under Section 56 4(b)(1) of the Act, 21 U.S.C. section 360bbb-3(b)(1), unless the authorization is terminated or revoked sooner. Performed at [facility], [place] [address], [place], [place] [address]     Radiology Reports Dg Chest Portable 1 View  Result Date: 04/09/2019 CLINICAL DATA:  Cough EXAM: PORTABLE CHEST 1 VIEW COMPARISON:  August 17, 2018. FINDINGS: The heart size and mediastinal contours are within normal limits. Both lungs are clear. The visualized skeletal structures are unremarkable. IMPRESSION: No active disease. Electronically Signed   By: Constance Holster M.D.   On: 04/09/2019 17:14   Mm 3d Screen Breast Bilateral  Result Date: 03/20/2019 CLINICAL DATA:  Screening. EXAM:  DIGITAL SCREENING BILATERAL MAMMOGRAM WITH TOMO AND CAD COMPARISON:  Previous exam(s). ACR Breast Density Category b: There are scattered areas of fibroglandular density. FINDINGS: There are no findings suspicious for malignancy. Images were processed with CAD. IMPRESSION: No mammographic evidence of malignancy. A result letter of this screening mammogram will be mailed directly to the patient. RECOMMENDATION: Screening mammogram in one year. (Code:SM-B-01Y) BI-RADS CATEGORY  1: Negative. Electronically Signed   By: Seward Meth  Augustin Coupe M.D.   On: 03/20/2019 13:41   US Abdomen Limited Ruq  Result Date: 04/09/2019 CLINICAL DATA:  Right upper quadrant abdominal pain, vomiting EXAM: ULTRASOUND ABDOMEN LIMITED RIGHT UPPER QUADRANT COMPARISON:  12/02/2016 FINDINGS: Gallbladder: There are multiple echogenic, shadowing stones within the gallbladder body and neck. Gallbladder is mildly dilated. No wall thickening visualized. No sonographic Murphy sign noted by sonographer. Common bile duct: Diameter: 4 mm Liver: No focal lesion identified. Mildly increased hepatic parenchymal echogenicity. Portal vein is patent on color Doppler imaging with normal direction of blood flow towards the liver. Other: None. IMPRESSION: 1. Cholelithiasis with numerous gallstones in the region of the gallbladder neck. Gallbladder is mildly dilated, however there is no focal gallbladder wall thickening and no reported sonographic Murphy's sign. Consider nuclear medicine hepatobiliary scan if clinical suspicion for acute cholecystitis remains high. 2. The echogenicity of the liver is increased. This is a nonspecific finding but is most commonly seen with fatty infiltration of the liver. There are no obvious focal liver lesions. Electronically Signed   By: Davina Poke M.D.   On: 04/09/2019 18:26     CBC Recent Labs  Lab 04/09/19 1615 04/10/19 0518  WBC 3.6* 3.9*  HGB 10.8* 9.1*  HCT 33.1* 27.9*  PLT 190 173  MCV 81.5 82.3  MCH 26.6  26.8  MCHC 32.6 32.6  RDW 12.3 12.3    Chemistries  Recent Labs  Lab 04/09/19 1615 04/10/19 0518 04/11/19 0531  NA 126* 135 135  K 3.6 3.4* 4.0  CL 87* 103 101  CO2 25 25 25   GLUCOSE 584* 319* 365*  BUN 39* 39* 38*  CREATININE 2.15* 2.01* 1.94*  CALCIUM 9.0 8.0* 8.4*  AST 13*  --   --   ALT 12  --   --   ALKPHOS 98  --   --   BILITOT 0.8  --   --    ------------------------------------------------------------------------------------------------------------------ estimated creatinine clearance is 38.6 mL/min (A) (by C-G formula based on SCr of 1.94 mg/dL (H)). ------------------------------------------------------------------------------------------------------------------ Recent Labs    04/10/19 0518  HGBA1C 13.9*   ------------------------------------------------------------------------------------------------------------------ No results for input(s): CHOL, HDL, LDLCALC, TRIG, CHOLHDL, LDLDIRECT in the last 72 hours. ------------------------------------------------------------------------------------------------------------------ No results for input(s): TSH, T4TOTAL, T3FREE, THYROIDAB in the last 72 hours.  Invalid input(s): FREET3 ------------------------------------------------------------------------------------------------------------------ No results for input(s): VITAMINB12, FOLATE, FERRITIN, TIBC, IRON, RETICCTPCT in the last 72 hours.  Coagulation profile No results for input(s): INR, PROTIME in the last 168 hours.  No results for input(s): DDIMER in the last 72 hours.  Cardiac Enzymes No results for input(s): CKMB, TROPONINI, MYOGLOBIN in the last 168 hours.  Invalid input(s): CK ------------------------------------------------------------------------------------------------------------------ Invalid input(s): POCBNP    Assessment & Plan   Hyperglycemic hyperosmolar syndrome in uncontrolled type 2 diabetes- due to running out of her insulin about 1  week ago.   Patient seen and Lantus increased this morning to her home dose Will add 10 units of scheduled pre-meal insulin Change sliding scale to high resistant Start Lantus at bedtime as well due to persistently high his blood sugars  AKI in CKD III- due to dehydration. -Holding home lisinopril-HCTZ -Improved with IV fluids -Avoid nephrotoxic agents  Nausea/vomiting/epigastric abdominal pain- likely due to hyperglycemia -IV antiemetics -Improved today  Cholelithiasis- asymptomatic.  Incidental finding on right upper quadrant abdominal ultrasound.  No tenderness to palpation of the right upper quadrant on exam. -Monitor -We will refer to surgery as outpatient  Hypertension- blood pressure stable -Continue home norvasc   Hyperlipidemia-stable -Continue home  Lipitor  Chronic normocytic anemia- hemoglobin at baseline -Monitoria      Code Status Orders  (From admission, onward)         Start     Ordered   04/09/19 2351  Full code  Continuous     04/09/19 2350        Code Status History    Date Active Date Inactive Code Status Order ID Comments User Context   08/17/2018 1324 08/19/2018 2121 Full Code [id number]  Karmen Bongo, MD ED   Advance Care Planning Activity           Consults none  DVT Prophylaxis  Lovenox   Lab Results  Component Value Date   PLT 173 04/10/2019     Time Spent in minutes   34min Greater than 50% of time spent in care coordination and counseling patient regarding the condition and plan of care.   Dustin Flock M.D on 04/11/2019 at 2:18 PM  Between 7am to 6pm - Pager - [phone]  After 6pm go to [email or web address] - Proofreader  [employer]   Office  [phone]

## 2019-04-11 NOTE — Progress Notes (Signed)
Patient's blood glucose levels were 475, 407, 407, 460, and 393 this morning. Administered insulin per MD order. Patient is asymptomatic. Will continue to monitor glucose levels.  Marry Guan, RN 04/11/2019 3:41 PM

## 2019-04-12 LAB — GLUCOSE, CAPILLARY
Glucose-Capillary: 164 mg/dL — ABNORMAL HIGH (ref 70–99)
Glucose-Capillary: 141 mg/dL — ABNORMAL HIGH (ref 70–99)
Glucose-Capillary: 178 mg/dL — ABNORMAL HIGH (ref 70–99)

## 2019-04-12 MED ORDER — INSULIN ASPART 100 UNIT/ML ~~LOC~~ SOLN: 6.0000 [IU] | Freq: Three times a day (TID) | SUBCUTANEOUS | 0 refills | Status: DC

## 2019-04-12 MED ORDER — BLOOD GLUCOSE MONITOR KIT: PACK | 2 refills | Status: DC

## 2019-04-12 NOTE — TOC Initial Note (Signed)
Transition of Care [facility]) - Initial/Assessment Note    Patient Details  Name: [name] [name] MRN: [id number] Date of Birth: [birth year]  [facility] of [facility]) CM/SW Contact:    Beverly Sessions, RN Phone Number: 04/12/2019, 10:18 AM  Clinical Narrative:                 Patient admitted for hyperglycemia  Patient did not pick up her Lantus from the pharmacy.  Patient states that her Lantus is covered by her Medicaid.  She just didn't pick up.  Patient didn't have a reason why she didn't get it.  She assures the MD that she will "not ever do that again"  Patient confirms she has a a working glucometer and supplies in the home  Patient states that she lives with a friend and her nephew.  States that her friend provides transportation.   Patient states that she is independent  PCP [facility] - denies any issues obtaining medications             Patient Goals and CMS Choice        Expected Discharge Plan and Services           Expected Discharge Date: 04/12/19                                    Prior Living Arrangements/Services                       Activities of Daily Living Home Assistive Devices/Equipment: None ADL Screening (condition at time of admission) Patient's cognitive ability adequate to safely complete daily activities?: Yes Is the patient deaf or have difficulty hearing?: No Does the patient have difficulty seeing, even when wearing glasses/contacts?: No Does the patient have difficulty concentrating, remembering, or making decisions?: No Patient able to express need for assistance with ADLs?: Yes Does the patient have difficulty dressing or bathing?: No Independently performs ADLs?: Yes (appropriate for developmental age) Does the patient have difficulty walking or climbing stairs?: No Weakness of Legs: None Weakness of Arms/Hands: None  Permission Sought/Granted                  Emotional  Assessment              Admission diagnosis:  RUQ pain [R10.11] Patient Active Problem List   Diagnosis Date Noted  . Hyperglycemia 04/09/2019  . Snoring 03/13/2019  . Symptomatic anemia 08/17/2018  . Acute renal failure (ARF) ([place]) 08/17/2018  . Chronic cystitis 08/03/2018  . Diabetic neuropathy ([place]) 06/16/2017  . Non compliance w medication regimen 04/12/2017  . Family history of colon cancer in mother 11/17/2016  . Dyspareunia in female 11/11/2016  . Screen for colon cancer 11/11/2016  . History of ovarian cyst 11/11/2016  . Chronic hepatitis C without hepatic coma ([place]) 11/09/2016  . Hyperlipidemia 10/07/2016  . Type 2 diabetes mellitus ([place]) 01/29/2014  . BREAST PAIN, BILATERAL 02/19/2010  . ANEMIA, IRON DEFICIENCY 10/03/2009  . LEUKOPENIA, MILD 09/19/2009  . CHEST PAIN UNSPECIFIED 09/19/2009  . LIPOMA 06/18/2009  . 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 TINEA PEDIS 05/07/2009  . SKIN LESION 05/07/2009  . COUGH 05/07/2009  . SUBSTANCE ABUSE, MULTIPLE 02/22/2007  . Essential hypertension 02/22/2007   PCP:  [name], MD Pharmacy:   [place] ([place]), [place] - [address]  [place]) [place] [address] Phone: [phone] Fax: [phone]     Social Determinants of Health (SDOH) Interventions    Readmission Risk Interventions Readmission Risk Prevention Plan 04/12/2019  Transportation Screening Complete  PCP or Specialist Appt within 3-5 Days (No Data)  Palliative Care Screening Not Applicable  Medication Review (RN Care Manager) Complete  Some recent data might be hidden

## 2019-04-12 NOTE — Discharge Summary (Signed)
[place] at [facility], 58 y.o., DOB 08/26/1960, MRN [id number]. Admission date: 04/09/2019 Discharge Date 04/12/2019 Primary MD [name], MD Admitting Physician Sela Hua, MD  Admission Diagnosis  RUQ pain [R10.11]  Discharge Diagnosis   Active Problems:  Hyperglycemic hyperosmolar syndromein uncontrolled type 2 diabetes AKI on CKD3 Nausea/vomiting/epigastric abdominal pain-due to elevated bg Cholelithiasis-asymptomatic hypertension Chronic normocytic anemia    Hospital Course  [name] [name]  is a 58 y.o. female with a known history of chronic hepatitis C, type 2 diabetes, history of HSV, anemia who presented to the ED with high blood sugars, nausea, and vomiting.  Patient states that yesterday evening, she noticed her blood sugar was high at 600.  Patient was admitted to the hospital.  Initially started on an insulin drip with improvement in her blood sugars.  And her medications were adjusted hemoglobin A1c is close to 14 I recommended strongly diet control.  I have also referred patient to her primary care provider whom should refer her to an endocrinologist.            Consults  None  Significant Tests:  See full reports for all details     Dg Chest Portable 1 View  Result Date: 04/09/2019 CLINICAL DATA:  Cough EXAM: PORTABLE CHEST 1 VIEW COMPARISON:  August 17, 2018. FINDINGS: The heart size and mediastinal contours are within normal limits. Both lungs are clear. The visualized skeletal structures are unremarkable. IMPRESSION: No active disease. Electronically Signed   By: Constance Holster M.D.   On: 04/09/2019 17:14   Mm 3d Screen Breast Bilateral  Result Date: 03/20/2019 CLINICAL DATA:  Screening. EXAM: DIGITAL SCREENING BILATERAL MAMMOGRAM WITH TOMO AND CAD COMPARISON:  Previous exam(s). ACR Breast Density Category b: There are scattered areas of fibroglandular density. FINDINGS: There are no findings  suspicious for malignancy. Images were processed with CAD. IMPRESSION: No mammographic evidence of malignancy. A result letter of this screening mammogram will be mailed directly to the patient. RECOMMENDATION: Screening mammogram in one year. (Code:[id number]-B-01Y) BI-RADS CATEGORY  1: Negative. Electronically Signed   By: Abelardo Diesel M.D.   On: 03/20/2019 13:41   US Abdomen Limited Ruq  Result Date: 04/09/2019 CLINICAL DATA:  Right upper quadrant abdominal pain, vomiting EXAM: ULTRASOUND ABDOMEN LIMITED RIGHT UPPER QUADRANT COMPARISON:  12/02/2016 FINDINGS: Gallbladder: There are multiple echogenic, shadowing stones within the gallbladder body and neck. Gallbladder is mildly dilated. No wall thickening visualized. No sonographic Murphy sign noted by sonographer. Common bile duct: Diameter: 4 mm Liver: No focal lesion identified. Mildly increased hepatic parenchymal echogenicity. Portal vein is patent on color Doppler imaging with normal direction of blood flow towards the liver. Other: None. IMPRESSION: 1. Cholelithiasis with numerous gallstones in the region of the gallbladder neck. Gallbladder is mildly dilated, however there is no focal gallbladder wall thickening and no reported sonographic Murphy's sign. Consider nuclear medicine hepatobiliary scan if clinical suspicion for acute cholecystitis remains high. 2. The echogenicity of the liver is increased. This is a nonspecific finding but is most commonly seen with fatty infiltration of the liver. There are no obvious focal liver lesions. Electronically Signed   By: Davina Poke M.D.   On: 04/09/2019 18:26       Today   Subjective:   [name] [name]  Pt doing well  Objective:   Blood pressure 126/77, pulse 86, temperature 98.5 F (36.9 C), temperature source Oral, resp. rate 20, height _0  (1.676 m), weight 104.5 kg, last  menstrual period 04/01/2010, SpO2 96 %.  .  Intake/Output Summary (Last 24 hours) at 04/12/2019 1526 Last data filed at  04/12/2019 0900 Gross per 24 hour  Intake 1185.35 ml  Output 400 ml  Net 785.35 ml    Exam VITAL SIGNS: Blood pressure 126/77, pulse 86, temperature 98.5 F (36.9 C), temperature source Oral, resp. rate 20, height _0  (1.676 m), weight 104.5 kg, last menstrual period 04/01/2010, SpO2 96 %.  GENERAL:  58 y.o.-year-old patient lying in the bed with no acute distress.  EYES: Pupils equal, round, reactive to light and accommodation. No scleral icterus. Extraocular muscles intact.  HEENT: Head atraumatic, normocephalic. Oropharynx and nasopharynx clear.  NECK:  Supple, no jugular venous distention. No thyroid enlargement, no tenderness.  LUNGS: Normal breath sounds bilaterally, no wheezing, rales,rhonchi or crepitation. No use of accessory muscles of respiration.  CARDIOVASCULAR: S1, S2 normal. No murmurs, rubs, or gallops.  ABDOMEN: Soft, nontender, nondistended. Bowel sounds present. No organomegaly or mass.  EXTREMITIES: No pedal edema, cyanosis, or clubbing.  NEUROLOGIC: Cranial nerves II through XII are intact. Muscle strength 5/5 in all extremities. Sensation intact. Gait not checked.  PSYCHIATRIC: The patient is alert and oriented x 3.  SKIN: No obvious rash, lesion, or ulcer.   Data Review     CBC w Diff:  Lab Results  Component Value Date   WBC 3.9 (L) 04/10/2019   HGB 9.1 (L) 04/10/2019   HGB 11.3 08/31/2018   HCT 27.9 (L) 04/10/2019   HCT 35.3 08/31/2018   PLT 173 04/10/2019   PLT 208 08/31/2018   LYMPHOPCT 29 08/18/2018   MONOPCT 8 08/18/2018   EOSPCT 0 08/18/2018   BASOPCT 0 08/18/2018   CMP:  Lab Results  Component Value Date   NA 135 04/11/2019   NA 144 01/29/2019   K 4.0 04/11/2019   CL 101 04/11/2019   CO2 25 04/11/2019   BUN 38 (H) 04/11/2019   BUN 31 (H) 01/29/2019   CREATININE 1.94 (H) 04/11/2019   CREATININE 0.88 12/12/2012   PROT 7.9 04/09/2019   PROT 7.1 01/29/2019   ALBUMIN 3.8 04/09/2019   ALBUMIN 4.1 01/29/2019   BILITOT 0.8 04/09/2019    BILITOT 0.2 01/29/2019   ALKPHOS 98 04/09/2019   AST 13 (L) 04/09/2019   ALT 12 04/09/2019   ALT 15 11/18/2016  .  Micro Results Recent Results (from the past 240 hour(s))  SARS CORONAVIRUS 2 (TAT 6-24 HRS) Nasopharyngeal Nasopharyngeal Swab     Status: None   Collection Time: 04/09/19 10:04 PM   Specimen: Nasopharyngeal Swab  Result Value Ref Range Status   SARS Coronavirus 2 NEGATIVE NEGATIVE Final    Comment: (NOTE) SARS-CoV-2 target nucleic acids are NOT DETECTED. The SARS-CoV-2 RNA is generally detectable in upper and lower respiratory specimens during the acute phase of infection. Negative results do not preclude SARS-CoV-2 infection, do not rule out co-infections with other pathogens, and should not be used as the sole basis for treatment or other patient management decisions. Negative results must be combined with clinical observations, patient history, and epidemiological information. The expected result is Negative. Fact Sheet for Patients: SugarRoll.be Fact Sheet for Healthcare Providers: https://www.woods-mathews.com/ This test is not yet approved or cleared by the [place] FDA and  has been authorized for detection and/or diagnosis of SARS-CoV-2 by FDA under an Emergency Use Authorization (EUA). This EUA will remain  in effect (meaning this test can be used) for the duration of the COVID-19 declaration under Section 56  4(b)(1) of the Act, 21 U.S.C. section 360bbb-3(b)(1), unless the authorization is terminated or revoked sooner. Performed at [facility], [place] [address] [place], [place] [address]         Code Status Orders  (From admission, onward)         Start     Ordered   04/09/19 2351  Full code  Continuous     04/09/19 2350        Code Status History    Date Active Date Inactive Code Status Order ID Comments User Context   08/17/2018 1324 08/19/2018 2121 Full Code [id number]  Karmen Bongo, MD ED   Advance Care Planning Activity          Follow-up Information    [name], MD. Go on 04/23/2019.   Specialty: Family Medicine Why: 1050am appointment Contact information: [place] [address] [phone]        Judi Cong, MD. Schedule an appointment as soon as possible for a visit on 05/03/2019.   Specialty: Endocrinology Why: poorly controlled dm Contact information: [place] [address] [phone]        Benjamine Sprague, DO On 05/02/2019.   Specialty: Surgery Why: gallstones will need gb out eventually  appointment time 1015am Contact information: [place] [address] [phone]           Discharge Medications   Allergies as of 04/12/2019   No Known Allergies     Medication List    TAKE these medications   albuterol 108 (90 Base) MCG/ACT inhaler Commonly known as: VENTOLIN HFA Inhale 2 puffs into the lungs every 6 (six) hours as needed for wheezing or shortness of breath.   amLODipine 10 MG tablet Commonly known as: NORVASC Take 1 tablet (10 mg total) by mouth daily.   atorvastatin 40 MG tablet Commonly known as: LIPITOR Take 1 tablet (40 mg total) by mouth daily.   blood glucose meter kit and supplies Kit Dispense based on patient and insurance preference. Use up to four times daily as directed. (FOR ICD-9 250.00, 250.01).   insulin aspart 100 UNIT/ML injection Commonly known as: novoLOG Inject 6 Units into the skin 3 (three) times daily with meals.   Lantus SoloStar 100 UNIT/ML Solostar Pen Generic drug: Insulin Glargine Inject 65 Units into the skin at bedtime.   lisinopril-hydrochlorothiazide 20-25 MG tablet Commonly known as: ZESTORETIC Take 1 tablet by mouth daily.   Lyrica 75 MG capsule Generic drug: pregabalin Take 1 capsule (75 mg total) by mouth 2 (two) times daily.   Victoza 18 MG/3ML Sopn Generic drug:  liraglutide Inject 0.3 mLs (1.8 mg total) into the skin daily with breakfast.          Total Time in preparing paper work, data evaluation and todays exam - 79 minutes  Dustin Flock M.D on 04/12/2019 at 3:26 [place]  [phone]

## 2019-04-20 ENCOUNTER — Other Ambulatory Visit: Payer: Self-pay

## 2019-04-20 ENCOUNTER — Emergency Department
Admission: EM | Admit: 2019-04-20 | Discharge: 2019-04-20 | Disposition: A | Discharge status: Home / Self Care | Payer: Medicaid Other | Attending: Student | Admitting: Student

## 2019-04-20 DIAGNOSIS — Z79899 Other long term (current) drug therapy: Secondary | ICD-10-CM | POA: Diagnosis not present

## 2019-04-20 DIAGNOSIS — R0981 Nasal congestion: Secondary | ICD-10-CM | POA: Insufficient documentation

## 2019-04-20 DIAGNOSIS — Z794 Long term (current) use of insulin: Secondary | ICD-10-CM | POA: Diagnosis not present

## 2019-04-20 DIAGNOSIS — E119 Type 2 diabetes mellitus without complications: Secondary | ICD-10-CM | POA: Diagnosis not present

## 2019-04-20 DIAGNOSIS — R0602 Shortness of breath: Secondary | ICD-10-CM | POA: Insufficient documentation

## 2019-04-20 DIAGNOSIS — I10 Essential (primary) hypertension: Secondary | ICD-10-CM | POA: Diagnosis not present

## 2019-04-20 DIAGNOSIS — R220 Localized swelling, mass and lump, head: Secondary | ICD-10-CM

## 2019-04-20 MED ORDER — DIPHENHYDRAMINE HCL 50 MG/ML IJ SOLN
25.0000 mg | Freq: Once | INTRAMUSCULAR | Status: AC
  Administered 2019-04-20: 25 mg via INTRAVENOUS
  Filled 2019-04-20: qty 1

## 2019-04-20 MED ORDER — METHYLPREDNISOLONE SODIUM SUCC 125 MG IJ SOLR
125.0000 mg | Freq: Once | INTRAMUSCULAR | Status: AC
  Administered 2019-04-20: 125 mg via INTRAVENOUS
  Filled 2019-04-20: qty 2

## 2019-04-20 MED ORDER — SODIUM CHLORIDE 0.9 % IV SOLN
40.0000 mg | Freq: Once | INTRAVENOUS | Status: AC
  Administered 2019-04-20: 40 mg via INTRAVENOUS
  Filled 2019-04-20: qty 4

## 2019-04-20 NOTE — ED Notes (Addendum)
First Nurse Note: Pt to ED for facial swelling and difficulty breathing. Pt ambulatory into ED with [employer] food bag and drink. Pt was advised not to eat or drink anything else until she saw the MD. Patient verbalized understanding, however, she continued to drink her drink.

## 2019-04-20 NOTE — ED Provider Notes (Signed)
[facility] Emergency Department Provider Note  ____________________________________________   First MD Initiated Contact with Patient 04/20/19 1536     (approximate)  I have reviewed the triage vital signs and the nursing notes.  History  Chief Complaint Facial Swelling, Nasal Congestion, and Shortness of Breath    HPI [name] [name] is a 58 y.o. female with a history of hypertension on lisinopril/HCTZ who presents emergency department for facial swelling.  Patient states when she woke up this morning she noticed bilateral eye swelling, facial swelling, and lip swelling.  Her partner at bedside states on arrival to the emergency department her symptoms have actually started to improve, and that her lip and most of her facial swelling has nearly resolved.  At this point, she primarily only has swelling to the bilateral eyes.  She denies any known insect bites.  She denies any new skin exposures - she denies any new lotions, creams, detergents, etc.  She denies any difficulty breathing.  She denies any history of similar symptoms.  Denies any eye pain, eye drainage, or visual changes.   Past Medical Hx Past Medical History:  Diagnosis Date  . Anemia   . Chronic hepatitis C without hepatic coma ([place]) 11/09/2016  . Diabetes mellitus   . Fibroids   . 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 Hypertension   . MRSA (methicillin resistant Staphylococcus aureus)   . Trichomonas   . VAGINITIS, BACTERIAL, RECURRENT 08/15/2007   Qualifier: Diagnosis of  By: Radene Ou MD, [place]      Problem List Patient Active Problem List   Diagnosis Date Noted  . Hyperglycemia 04/09/2019  . Snoring 03/13/2019  . Symptomatic anemia 08/17/2018  . Acute renal failure (ARF) ([place]) 08/17/2018  . Chronic cystitis 08/03/2018  . Diabetic neuropathy ([place]) 06/16/2017  . Non compliance w medication regimen 04/12/2017  . Family history of colon cancer in  mother 11/17/2016  . Dyspareunia in female 11/11/2016  . Screen for colon cancer 11/11/2016  . History of ovarian cyst 11/11/2016  . Chronic hepatitis C without hepatic coma ([place]) 11/09/2016  . Hyperlipidemia 10/07/2016  . Type 2 diabetes mellitus ([place]) 01/29/2014  . BREAST PAIN, BILATERAL 02/19/2010  . ANEMIA, IRON DEFICIENCY 10/03/2009  . LEUKOPENIA, MILD 09/19/2009  . CHEST PAIN UNSPECIFIED 09/19/2009  . LIPOMA 06/18/2009  . 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 TINEA PEDIS 05/07/2009  . SKIN LESION 05/07/2009  . COUGH 05/07/2009  . SUBSTANCE ABUSE, MULTIPLE 02/22/2007  . Essential hypertension 02/22/2007    Past Surgical Hx Past Surgical History:  Procedure Laterality Date  . BREAST BIOPSY Left 2018  . CESAREAN SECTION     breech    Medications Prior to Admission medications   Medication Sig Start Date End Date Taking? Authorizing Provider  albuterol (VENTOLIN HFA) 108 (90 Base) MCG/ACT inhaler Inhale 2 puffs into the lungs every 6 (six) hours as needed for wheezing or shortness of breath. 01/29/19   [name], MD  amLODipine (NORVASC) 10 MG tablet Take 1 tablet (10 mg total) by mouth daily. 01/29/19   [name], MD  atorvastatin (LIPITOR) 40 MG tablet Take 1 tablet (40 mg total) by mouth daily. 01/29/19   [name], MD  blood glucose meter kit and supplies KIT Dispense based on patient and insurance preference. Use up to four times daily as directed. (FOR ICD-9 250.00, 250.01). 04/12/19   Dustin Flock, MD  insulin aspart (NOVOLOG) 100 UNIT/ML injection Inject 6  Units into the skin 3 (three) times daily with meals. 04/12/19 05/12/19  Dustin Flock, MD  Insulin Glargine (LANTUS SOLOSTAR) 100 UNIT/ML Solostar Pen Inject 65 Units into the skin at bedtime. 01/29/19   [name], MD  liraglutide (VICTOZA) 18 MG/3ML SOPN Inject 0.3 mLs (1.8 mg total) into the skin daily with breakfast. 01/29/19   [name], MD  lisinopril-hydrochlorothiazide  (ZESTORETIC) 20-25 MG tablet Take 1 tablet by mouth daily. 02/08/19   Minus Breeding, MD  LYRICA 75 MG capsule Take 1 capsule (75 mg total) by mouth 2 (two) times daily. 03/28/19   [name], MD    Allergies Lisinopril  Family Hx Family History  Problem Relation Age of Onset  . Colon cancer Mother   . Liver disease Sister   . Other Neg Hx   . Breast cancer Neg Hx   . Esophageal cancer Neg Hx   . Rectal cancer Neg Hx     Social Hx Social History   Tobacco Use  . Smoking status: Never Smoker  . Smokeless tobacco: Never Used  Substance Use Topics  . Alcohol use: No  . Drug use: No    Types: Cocaine, Marijuana    Comment: remote h/o cocaine and marijuana use     Review of Systems  Constitutional: Negative for fever, chills. Eyes: Negative for visual changes. ENT: + facial swelling Cardiovascular: Negative for chest pain. Respiratory: Negative for shortness of breath. Gastrointestinal: Negative for nausea, vomiting.  Genitourinary: Negative for dysuria. Musculoskeletal: Negative for leg swelling. Skin: Negative for rash. Neurological: Negative for for headaches.   Physical Exam  Vital Signs: ED Triage Vitals  Enc Vitals Group     BP 04/20/19 1517 129/74     Pulse Rate 04/20/19 1517 92     Resp 04/20/19 1517 (!) 22     Temp 04/20/19 1517 98.9 F (37.2 C)     Temp Source 04/20/19 1517 Oral     SpO2 04/20/19 1517 95 %     Weight 04/20/19 1518 229 lb 4.5 oz (104 kg)     Height 04/20/19 1518 '5\' 6"'  (1.676 m)     Head Circumference --      Peak Flow --      Pain Score 04/20/19 1518 0     Pain Loc --      Pain Edu? --      Excl. in [place]? --     Constitutional: Alert and oriented.  Head: Normocephalic. Atraumatic. Eyes: Conjunctivae clear. Sclera anicteric.  EOMI.  No pain with eye movements.  Mild swelling of bilateral eyelids.  No erythema or drainage.  No proptosis.  Vision grossly intact. Nose: No congestion. No rhinorrhea. Mouth/Throat: Mucous  membranes are moist.  No lip, tongue, uvular swelling. Neck: No stridor.  No voice changes. Cardiovascular: Normal rate, regular rhythm. Extremities well perfused. Respiratory: Normal respiratory effort.  Lungs CTAB. Gastrointestinal: Soft. Non-tender. Non-distended.  Musculoskeletal: No lower extremity edema. No deformities. Neurologic:  Normal speech and language. No gross focal neurologic deficits are appreciated.  Skin: Skin is warm, dry and intact. No rash noted. Psychiatric: Mood and affect are appropriate for situation.  EKG  Personally reviewed.   Rate: 91 Rhythm: Sinus Axis: Borderline leftward Intervals: Within normal limits No acute ischemic changes No STEMI    Radiology  N/A   Procedures  Procedure(s) performed (including critical care):  Procedures   Initial Impression / Assessment and Plan / ED Course  58 y.o. female who presents to the ED for  facial swelling, likely in the setting of lisinopril use.  No other identifiable inciting factors, no insect bites or new skin exposures, etc.  On exam, she has bilateral eyelid swelling.  No lip, tongue, uvular swelling.  Partner at bedside states that she did previously have some associated facial and lip swelling, however these have improved on arrival to the emergency department.  Full EOMI without discomfort.  No evidence of preseptal or orbital cellulitis by exam.  She denies any difficulty breathing, no stridor, no evidence of respiratory distress, satting high 90s on room air.  Plan: we will plan for IV with steroids, famotidine, Benadryl, however her presentation is most likely related to her lisinopril use.  After period of observation, patient reports continued improvement in her swelling.  Again, no subjective or objective evidence of respiratory distress.  Discussed strict cessation of her lisinopril, and follow-up with her PCP.  Patient and partner voiced understanding and are comfortable to plan and  discharge.  Given return precautions.   Final Clinical Impression(s) / ED Diagnosis  Final diagnoses:  Facial swelling       Note:  This document was prepared using Dragon voice recognition software and may include unintentional dictation errors.   Lilia Pro., MD 04/20/19 2113

## 2019-04-20 NOTE — ED Triage Notes (Signed)
Reports upon awakening today she noticed facial swelling, nasal congestion, cough and SOB. Pt denies any new mediations. Reports thick yellow productive sputum. Pt has obviously bilateral eyelid swelling. Wheezing heard at time of triage. RR even, increased WOB with exertion

## 2019-04-20 NOTE — ED Notes (Signed)
Sig other wheeled pt to lobby, pt DC by this RN at request of Dr Joan Mayans  Peripheral IV discontinued. Catheter intact. No signs of infiltration or redness. Gauze applied to IV site.   Discharge instructions reviewed with patient. Questions fielded by this RN. Patient verbalizes understanding of instructions. Patient discharged home in stable condition per monks. No acute distress noted at time of discharge.

## 2019-04-20 NOTE — Discharge Instructions (Addendum)
PLEASE STOP TAKING YOUR LISINOPRIL. Please tell your primary doctor about this.   Thank you for letting us take care of you in the emergency department today.   Please follow up with: - Your primary care doctor to review your ER visit and follow up on your symptoms.   Please return to the ER for any new or worsening symptoms.

## 2019-04-23 ENCOUNTER — Other Ambulatory Visit: Payer: Self-pay

## 2019-04-23 ENCOUNTER — Ambulatory Visit: Payer: Medicaid Other | Attending: Family Medicine | Admitting: Family Medicine

## 2019-04-23 ENCOUNTER — Encounter: Payer: Self-pay | Admitting: Family Medicine

## 2019-04-23 VITALS — BP 146/87 | HR 98 | Temp 98.5°F | Ht 66.0 in | Wt 242.0 lb

## 2019-04-23 DIAGNOSIS — Z1159 Encounter for screening for other viral diseases: Secondary | ICD-10-CM | POA: Diagnosis not present

## 2019-04-23 DIAGNOSIS — N183 Chronic kidney disease, stage 3 unspecified: Secondary | ICD-10-CM

## 2019-04-23 DIAGNOSIS — I10 Essential (primary) hypertension: Secondary | ICD-10-CM

## 2019-04-23 DIAGNOSIS — K8020 Calculus of gallbladder without cholecystitis without obstruction: Secondary | ICD-10-CM | POA: Diagnosis not present

## 2019-04-23 DIAGNOSIS — Z794 Long term (current) use of insulin: Secondary | ICD-10-CM | POA: Diagnosis not present

## 2019-04-23 DIAGNOSIS — E11311 Type 2 diabetes mellitus with unspecified diabetic retinopathy with macular edema: Secondary | ICD-10-CM | POA: Diagnosis not present

## 2019-04-23 DIAGNOSIS — E1122 Type 2 diabetes mellitus with diabetic chronic kidney disease: Secondary | ICD-10-CM

## 2019-04-23 DIAGNOSIS — E1149 Type 2 diabetes mellitus with other diabetic neurological complication: Secondary | ICD-10-CM | POA: Diagnosis not present

## 2019-04-23 DIAGNOSIS — R0609 Other forms of dyspnea: Secondary | ICD-10-CM | POA: Diagnosis not present

## 2019-04-23 LAB — GLUCOSE, POCT (MANUAL RESULT ENTRY): POC Glucose: 130 mg/dl — AB (ref 70–99)

## 2019-04-23 MED ORDER — ALBUTEROL SULFATE HFA 108 (90 BASE) MCG/ACT IN AERS: 2.0000 | INHALATION_SPRAY | Freq: Four times a day (QID) | RESPIRATORY_TRACT | 3 refills | Status: DC | PRN

## 2019-04-23 MED ORDER — INSULIN ASPART 100 UNIT/ML ~~LOC~~ SOLN: 6.0000 [IU] | Freq: Three times a day (TID) | SUBCUTANEOUS | 3 refills | Status: DC

## 2019-04-23 NOTE — Patient Instructions (Signed)

## 2019-04-24 LAB — BASIC METABOLIC PANEL
BUN/Creatinine Ratio: 21 (ref 9–23)
BUN: 24 mg/dL (ref 6–24)
CO2: 22 mmol/L (ref 20–29)
Calcium: 9.1 mg/dL (ref 8.7–10.2)
Chloride: 109 mmol/L — ABNORMAL HIGH (ref 96–106)
Creatinine, Ser: 1.17 mg/dL — ABNORMAL HIGH (ref 0.57–1.00)
GFR calc Af Amer: 59 mL/min/{1.73_m2} — ABNORMAL LOW (ref >59)
GFR calc non Af Amer: 51 mL/min/{1.73_m2} — ABNORMAL LOW (ref >59)
Glucose: 82 mg/dL (ref 65–99)
Potassium: 4.9 mmol/L (ref 3.5–5.2)
Sodium: 144 mmol/L (ref 134–144)

## 2019-04-24 NOTE — Progress Notes (Signed)
Subjective:  Patient ID: [name] [name], female    DOB: October 27, 1960  Age: 58 y.o. MRN: [id number]  CC: Hospitalization Follow-up   HPI [name] [name] is a 58 year old female with a history of type 2 diabetes mellitus (A1c 13.9), diabetic neuropathy hypertension, diabetic retinopathy, hyperlipidemia, hepatitis C (completed treatment with harvoni) who presents today for  a follow up visit Her A1c is 13.9 which is up from 8.1 previously.  She had a hospitalization last month for hyperglycemia with a blood sugar greater than 600, acute on chronic kidney injury.  She informs me she has been compliant with her medications but she had not been compliant with a diabetic diet.  During her hospitalization she was commenced on NovoLog.  At discharge her creatinine was 1.94. 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  Her echocardiogram revealed EF of 50% with mild diffuse hypokinesis, LVH.  I had referred her to cardiology and dyspnea was thought to be multifactorial including her weight.  I had previously placed her on Proventil MDI which she uses as needed. She had an ED visit last week for facial edema which was thought to be secondary to lisinopril with subsequent discontinuation of her ACE inhibitor.  Her blood sugars have been running between 100-200 and she endorses compliance with her medications.  Past Medical History:  Diagnosis Date  . Anemia   . Chronic hepatitis C without hepatic coma ([place]) 11/09/2016  . Diabetes mellitus   . Fibroids   . 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 Hypertension   . MRSA (methicillin resistant Staphylococcus aureus)   . Trichomonas   . VAGINITIS, BACTERIAL, RECURRENT 08/15/2007   Qualifier: Diagnosis of  By: Radene Ou MD, [place]      Past Surgical History:  Procedure Laterality Date  . BREAST BIOPSY Left 2018  . CESAREAN SECTION     breech    Family  History  Problem Relation Age of Onset  . Colon cancer Mother   . Liver disease Sister   . Other Neg Hx   . Breast cancer Neg Hx   . Esophageal cancer Neg Hx   . Rectal cancer Neg Hx     Allergies  Allergen Reactions  . Lisinopril Swelling    Outpatient Medications Prior to Visit  Medication Sig Dispense Refill  . amLODipine (NORVASC) 10 MG tablet Take 1 tablet (10 mg total) by mouth daily. 30 tablet 5  . atorvastatin (LIPITOR) 40 MG tablet Take 1 tablet (40 mg total) by mouth daily. 30 tablet 6  . blood glucose meter kit and supplies KIT Dispense based on patient and insurance preference. Use up to four times daily as directed. (FOR ICD-9 250.00, 250.01). 1 each 2  . Insulin Glargine (LANTUS SOLOSTAR) 100 UNIT/ML Solostar Pen Inject 65 Units into the skin at bedtime. 5 pen 5  . liraglutide (VICTOZA) 18 MG/3ML SOPN Inject 0.3 mLs (1.8 mg total) into the skin daily with breakfast. 30 mL 6  . LYRICA 75 MG capsule Take 1 capsule (75 mg total) by mouth 2 (two) times daily. 60 capsule 6  . albuterol (VENTOLIN HFA) 108 (90 Base) MCG/ACT inhaler Inhale 2 puffs into the lungs every 6 (six) hours as needed for wheezing or shortness of breath. 18 g 3  . insulin aspart (NOVOLOG) 100 UNIT/ML injection Inject 6 Units into the skin 3 (three) times daily with meals. 5.4  mL 0  . lisinopril-hydrochlorothiazide (ZESTORETIC) 20-25 MG tablet Take 1 tablet by mouth daily. (Patient not taking: Reported on 04/23/2019) 90 tablet 1   No facility-administered medications prior to visit.      ROS Review of Systems  Constitutional: Negative for activity change, appetite change and fatigue.  HENT: Negative for congestion, sinus pressure and sore throat.   Eyes: Negative for visual disturbance.  Respiratory: Positive for shortness of breath. Negative for cough, chest tightness and wheezing.   Cardiovascular: Negative for chest pain and palpitations.  Gastrointestinal: Negative for abdominal distention,  abdominal pain and constipation.  Endocrine: Negative for polydipsia.  Genitourinary: Negative for dysuria and frequency.  Musculoskeletal: Negative for arthralgias and back pain.  Skin: Negative for rash.  Neurological: Negative for tremors, light-headedness and numbness.  Hematological: Does not bruise/bleed easily.  Psychiatric/Behavioral: Negative for agitation and behavioral problems.    Objective:  BP (!) 146/87   Pulse 98   Temp 98.5 F (36.9 C) (Oral)   Ht '5\' 6"'  (1.676 m)   Wt 242 lb (109.8 kg)   LMP 04/01/2010   SpO2 99%   BMI 39.06 kg/m   BP/Weight 04/23/2019 04/20/2019 04/11/2682  Systolic BP 419 622 297  Diastolic BP 87 81 77  Wt. (Lbs) 242 229.28 -  BMI 39.06 37.01 -      Physical Exam Constitutional:      Appearance: She is well-developed.  Neck:     Vascular: No JVD.  Cardiovascular:     Rate and Rhythm: Normal rate.     Heart sounds: Normal heart sounds. No murmur.  Pulmonary:     Effort: Pulmonary effort is normal.     Breath sounds: Normal breath sounds. No wheezing or rales.  Chest:     Chest wall: No tenderness.  Abdominal:     General: Bowel sounds are normal. There is no distension.     Palpations: Abdomen is soft. There is no mass.     Tenderness: There is no abdominal tenderness.  Musculoskeletal: Normal range of motion.     Right lower leg: No edema.     Left lower leg: No edema.  Neurological:     Mental Status: She is alert and oriented to person, place, and time.  Psychiatric:        Mood and Affect: Mood normal.     CMP Latest Ref Rng & Units 04/23/2019 04/11/2019 04/10/2019  Glucose 65 - 99 mg/dL 82 365(H) 319(H)  BUN 6 - 24 mg/dL 24 38(H) 39(H)  Creatinine 0.57 - 1.00 mg/dL 1.17(H) 1.94(H) 2.01(H)  Sodium 134 - 144 mmol/L 144 135 135  Potassium 3.5 - 5.2 mmol/L 4.9 4.0 3.4(L)  Chloride 96 - 106 mmol/L 109(H) 101 103  CO2 20 - 29 mmol/L '22 25 25  ' Calcium 8.7 - 10.2 mg/dL 9.1 8.4(L) 8.0(L)  Total Protein 6.5 - 8.1 g/dL - - -   Total Bilirubin 0.3 - 1.2 mg/dL - - -  Alkaline Phos 38 - 126 U/L - - -  AST 15 - 41 U/L - - -  ALT 0 - 44 U/L - - -    Lipid Panel     Component Value Date/Time   CHOL 147 01/29/2019 1118   TRIG 102 01/29/2019 1118   HDL 59 01/29/2019 1118   CHOLHDL 2.5 01/29/2019 1118   CHOLHDL 3 06/18/2014 0951   VLDL 19.6 06/18/2014 0951   LDLCALC 68 01/29/2019 1118    CBC    Component Value Date/Time  WBC 3.9 (L) 04/10/2019 0518   RBC 3.39 (L) 04/10/2019 0518   HGB 9.1 (L) 04/10/2019 0518   HGB 11.3 08/31/2018 0925   HCT 27.9 (L) 04/10/2019 0518   HCT 35.3 08/31/2018 0925   PLT 173 04/10/2019 0518   PLT 208 08/31/2018 0925   MCV 82.3 04/10/2019 0518   MCV 85 08/31/2018 0925   MCH 26.8 04/10/2019 0518   MCHC 32.6 04/10/2019 0518   RDW 12.3 04/10/2019 0518   RDW 14.2 08/31/2018 0925   LYMPHSABS 1.7 08/31/2018 0925   MONOABS 0.3 08/18/2018 0644   EOSABS 0.0 08/31/2018 0925   BASOSABS 0.0 08/31/2018 0925    Lab Results  Component Value Date   HGBA1C 13.9 (H) 04/10/2019    Assessment & Plan:   1. 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 Surprisingly 3 months ago her A1c was 8.1 and she denies noncompliance with her medications but endorses noncompliance with a diabetic diet Blood sugars now have been good We will schedule with clinical pharmacist to review her blood sugar logs and titrate insulin accordingly Counseled on Diabetic diet, my plate method, 161 minutes of moderate intensity exercise/week Blood sugar logs with fasting goals of 80-120 mg/dl, random of less than 180 and in the event of sugars less than 60 mg/dl or greater than 400 mg/dl encouraged to notify the clinic. Advised on the need for annual eye exams, annual foot exams, Pneumonia vaccine. - POCT glucose (manual entry) -  insulin aspart (NOVOLOG) 100 UNIT/ML injection; Inject 6 Units into the skin 3 (three) times daily with meals.  Dispense: 30 mL; Refill: 3  2. Calculus of gallbladder without cholecystitis without obstruction Asymptomatic I have discussed ultrasound findings with her She is not interested in referral to general surgery at this time  3. Other form of dyspnea Unknown etiology Seen by cardiology and weight thought to be playing a role - Pulmonary function test; Future - albuterol (VENTOLIN HFA) 108 (90 Base) MCG/ACT inhaler; Inhale 2 puffs into the lungs every 6 (six) hours as needed for wheezing or shortness of breath.  Dispense: 18 g; Refill: 3  4. Other diabetic neurological complication associated with type 2 diabetes mellitus ([place]) Controlled on Lyrica  5. Essential hypertension Slightly elevated above goal No regimen change but advised to comply with lifestyle modifications Counseled on blood pressure goal of less than 130/80, low-sodium, DASH diet, medication compliance, 150 minutes of moderate intensity exercise per week. Discussed medication compliance, adverse effects. - Basic Metabolic Panel  6. Type 2 diabetes mellitus with stage 3 chronic kidney disease, with long-term current use of insulin, unspecified whether stage 3a or 3b CKD ([place]) Diabetic nephropathy We will check renal function again and refer to nephrology if still abnormal  7. Screening for viral disease She needs a COVID-19 test prior to her pulmonary function test - Novel Coronavirus, NAA ([facility]); Future    Meds ordered this encounter  Medications  . albuterol (VENTOLIN HFA) 108 (90 Base) MCG/ACT inhaler    Sig: Inhale 2 puffs into the lungs every 6 (six) hours as needed for wheezing or shortness of breath.    Dispense:  18 g    Refill:  3  . insulin aspart (NOVOLOG) 100 UNIT/ML injection    Sig: Inject 6 Units into the skin 3 (three) times daily with meals.  Dispense:  30 mL    Refill:  3     Follow-up: Return in about 1 week (around 04/30/2019) for [facility] -blood sugar log review; 3 months PCP.       [name], MD, FAAFP. [facility] [place], [place]   04/24/2019, 2:47 PM

## 2019-04-26 ENCOUNTER — Telehealth: Payer: Self-pay

## 2019-04-26 NOTE — Telephone Encounter (Signed)
-----   Message from [name], MD sent at 04/24/2019  3:01 PM EDT ----- Labs are normal and kidney function has normalized

## 2019-04-26 NOTE — Telephone Encounter (Signed)
Patient name and DOB has been verified Patient was informed of lab results. Patient had no questions.  

## 2019-05-04 ENCOUNTER — Ambulatory Visit: Payer: Medicaid Other | Admitting: Cardiology

## 2019-05-04 ENCOUNTER — Other Ambulatory Visit: Payer: Self-pay | Admitting: Family Medicine

## 2019-05-09 DIAGNOSIS — R0609 Other forms of dyspnea: Secondary | ICD-10-CM | POA: Insufficient documentation

## 2019-05-09 DIAGNOSIS — R0602 Shortness of breath: Secondary | ICD-10-CM | POA: Insufficient documentation

## 2019-05-09 DIAGNOSIS — R0600 Dyspnea, unspecified: Secondary | ICD-10-CM | POA: Insufficient documentation

## 2019-05-09 DIAGNOSIS — G4730 Sleep apnea, unspecified: Secondary | ICD-10-CM | POA: Insufficient documentation

## 2019-05-09 DIAGNOSIS — G4733 Obstructive sleep apnea (adult) (pediatric): Secondary | ICD-10-CM | POA: Insufficient documentation

## 2019-05-09 NOTE — Progress Notes (Signed)
Cardiology Office Note   Date:  05/10/2019   ID:  [name] [name], DOB Feb 20, 1961, MRN [id number]  PCP:  [name], MD  Cardiologist:   Minus Breeding, MD Referring:  [name], MD   Chief Complaint  Patient presents with  . Shortness of Breath      History of Present Illness: [name] [name] is a 58 y.o. female who is referred for evaluation of SOB.  She had an echo with a mildly reduced EF of 50%.  There is moderate concentric hypertrophy.  We saw her in the past because of difficult to control hypertension poorly controlled sugars.  I saw her most recently for dyspnea.   I increased her lisinopril.  She has also been   She comes back today because of dyspnea.  She has not been keeping a blood pressure check because she does not have a machine at home.  She had an allergic reaction it looks like the lisinopril.  She had facial swelling and she was in the emergency room.  I reviewed these records.  She was taken off of that.  She gets short of breath with activity such as walking 50 yards on level ground.  She was short of breath walking into the office but her oxygen saturation was in the mid 90s.  She is not describing PND or orthopnea.  She is not describing chest pressure, neck or arm discomfort.  Of note she has been found to have sleep apnea and was supposed to get BiPAP but this is not yet been ordered.    Past Medical History:  Diagnosis Date  . Anemia   . Chronic hepatitis C without hepatic coma ([place]) 11/09/2016  . Diabetes mellitus   . Fibroids   . 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 Hypertension   . MRSA (methicillin resistant Staphylococcus aureus)   . Trichomonas   . VAGINITIS, BACTERIAL, RECURRENT 08/15/2007   Qualifier: Diagnosis of  By: Radene Ou MD, [place]      Past Surgical History:  Procedure Laterality Date  . BREAST BIOPSY Left 2018  . CESAREAN SECTION     breech     Current Outpatient Medications   Medication Sig Dispense Refill  . albuterol (VENTOLIN HFA) 108 (90 Base) MCG/ACT inhaler Inhale 2 puffs into the lungs every 6 (six) hours as needed for wheezing or shortness of breath. 18 g 3  . amLODipine (NORVASC) 10 MG tablet Take 1 tablet (10 mg total) by mouth daily. 30 tablet 5  . atorvastatin (LIPITOR) 40 MG tablet Take 1 tablet (40 mg total) by mouth daily. 30 tablet 6  . blood glucose meter kit and supplies KIT Dispense based on patient and insurance preference. Use up to four times daily as directed. (FOR ICD-9 250.00, 250.01). 1 each 2  . insulin aspart (NOVOLOG) 100 UNIT/ML injection Inject 6 Units into the skin 3 (three) times daily with meals. 30 mL 3  . Insulin Glargine (LANTUS SOLOSTAR) 100 UNIT/ML Solostar Pen Inject 65 Units into the skin at bedtime. 5 pen 5  . labetalol (NORMODYNE) 100 MG tablet Take 1 tablet (100 mg total) by mouth 2 (two) times daily. 180 tablet 3  . liraglutide (VICTOZA) 18 MG/3ML SOPN Inject 0.3 mLs (1.8 mg total) into the skin daily with breakfast. 30 mL 6  . LYRICA 75 MG capsule Take 1 capsule (75 mg total) by mouth 2 (two) times daily. 60 capsule 6   No  current facility-administered medications for this visit.     Allergies:   Lisinopril    ROS:  Please see the history of present illness.   Otherwise, review of systems are positive for none.   All other systems are reviewed and negative.    PHYSICAL EXAM: VS:  Pulse 93   Ht '5\' 6"'  (1.676 m)   Wt 235 lb 12.8 oz (107 kg)   LMP 04/01/2010   SpO2 96%   BMI 38.06 kg/m  , BMI Body mass index is 38.06 kg/m. GENERAL:  Well appearing NECK:  No jugular venous distention, waveform within normal limits, carotid upstroke brisk and symmetric, no bruits, no thyromegaly LUNGS:  Clear to auscultation bilaterally CHEST:  Unremarkable HEART:  PMI not displaced or sustained,S1 and S2 within normal limits, no S3, no S4, no clicks, no rubs, 2 out of 6 brief apical systolic murmur nonradiating and not changing  with a Valsalva maneuver, no diastolic murmurs ABD:  Flat, positive bowel sounds normal in frequency in pitch, no bruits, no rebound, no guarding, no midline pulsatile mass, no hepatomegaly, no splenomegaly EXT:  2 plus pulses throughout, no edema, no cyanosis no clubbing   EKG:  EKG is not ordered today.    Recent Labs: 01/29/2019: BNP 7.9 04/09/2019: ALT 12 04/10/2019: Hemoglobin 9.1; Platelets 173 04/23/2019: BUN 24; Creatinine, Ser 1.17; Potassium 4.9; Sodium 144    Lipid Panel    Component Value Date/Time   CHOL 147 01/29/2019 1118   TRIG 102 01/29/2019 1118   HDL 59 01/29/2019 1118   CHOLHDL 2.5 01/29/2019 1118   CHOLHDL 3 06/18/2014 0951   VLDL 19.6 06/18/2014 0951   LDLCALC 68 01/29/2019 1118      Wt Readings from Last 3 Encounters:  05/10/19 235 lb 12.8 oz (107 kg)  04/23/19 242 lb (109.8 kg)  04/20/19 229 lb 4.5 oz (104 kg)      Other studies Reviewed: Additional studies/ records that were reviewed today include: Sleep study results Review of the above records demonstrates:  NA     ASSESSMENT AND PLAN:  DM:       The blood sugars poorly controlled with an A1c of 13.9.  I referred her back to [name], MD stressing the importance of this with the patient.  She says she has been referred to endocrinology.   HTN:     BP is not controlled.  She is allergic to lisinopril possibly.  I am going to add labetalol 100 mg twice daily.  I went back through the list of meds that she was on before.  We are going to try to send her a blood pressure cuff.  DYSPNEA: I think this is multifactorial.  I am getting better control of the blood pressure.  I will send her for a PYP scan as below.  She might need MRI eventually.  OBESITY: We talked about weight loss.  SLEEP APNEA:   I contacted our sleep clinic as the patient has not been notified about when she will get her BiPAP.  GERD taking care of this.    CKD:   Reduced to levels as above.  We will follow this.     Current medicines are reviewed at length with the patient today.  The patient does not have concerns regarding medicines.  The following changes have been made:  None  Labs/ tests ordered today include:   Orders Placed This Encounter  Procedures  . MYOCARDIAL AMYLOID IMAGING PLANAR & SPECT  Disposition:   FU with me in 2  months    Signed, Minus Breeding, MD  05/10/2019 1:53 PM     [facility]

## 2019-05-10 ENCOUNTER — Ambulatory Visit: Payer: Medicaid Other | Admitting: Cardiology

## 2019-05-10 ENCOUNTER — Encounter: Payer: Self-pay | Admitting: Cardiology

## 2019-05-10 ENCOUNTER — Other Ambulatory Visit: Payer: Self-pay

## 2019-05-10 ENCOUNTER — Telehealth: Payer: Self-pay | Admitting: *Deleted

## 2019-05-10 VITALS — HR 93 | Ht 66.0 in | Wt 235.8 lb

## 2019-05-10 DIAGNOSIS — G4730 Sleep apnea, unspecified: Secondary | ICD-10-CM

## 2019-05-10 DIAGNOSIS — R0602 Shortness of breath: Secondary | ICD-10-CM | POA: Diagnosis not present

## 2019-05-10 DIAGNOSIS — E859 Amyloidosis, unspecified: Secondary | ICD-10-CM | POA: Diagnosis not present

## 2019-05-10 DIAGNOSIS — E118 Type 2 diabetes mellitus with unspecified complications: Secondary | ICD-10-CM | POA: Diagnosis not present

## 2019-05-10 DIAGNOSIS — I10 Essential (primary) hypertension: Secondary | ICD-10-CM | POA: Diagnosis not present

## 2019-05-10 MED ORDER — LABETALOL HCL 100 MG PO TABS: 100.0000 mg | ORAL_TABLET | Freq: Two times a day (BID) | ORAL | 3 refills | Status: DC

## 2019-05-10 NOTE — Telephone Encounter (Signed)
BIPAP orders sent to [facility].

## 2019-05-10 NOTE — Patient Instructions (Addendum)
Medication Instructions:  Start taking 100mg  Labetalol twice daily.   If you need a refill on your cardiac medications before your next appointment, please call your pharmacy.   Lab work: NONE  Testing/Procedures:  A nuclear Amyloid Test at [facility].  Follow-Up: At [facility], you and your health needs are our priority.  As part of our continuing mission to provide you with exceptional heart care, we have created designated Provider Care Teams.  These Care Teams include your primary Cardiologist (physician) and Advanced Practice Providers (APPs -  Physician Assistants and Nurse Practitioners) who all work together to provide you with the care you need, when you need it. You may see Minus Breeding, MD or one of the following Advanced Practice Providers on your designated Care Team:    Rosaria Ferries, PA-C  Jory Sims, DNP, ANP  Cadence Kathlen Mody, NP  Your physician wants you to follow-up in: 2 months. You will receive a reminder letter in the mail two months in advance. If you don't receive a letter, please call our office to schedule the follow-up appointment.

## 2019-05-11 ENCOUNTER — Telehealth: Payer: Self-pay | Admitting: Licensed Clinical Social Worker

## 2019-05-11 NOTE — Telephone Encounter (Signed)
CSW referred to assist patient with obtaining a BP cuff. CSW contacted patient to inform cuff will be delivered to home. Patient grateful for support and assistance. CSW available as needed. [name], LCSW, CCSW-MCS [phone]  

## 2019-05-23 ENCOUNTER — Telehealth (HOSPITAL_COMMUNITY): Payer: Self-pay

## 2019-05-23 NOTE — Telephone Encounter (Signed)
Encounter complete. 

## 2019-05-24 LAB — BLOOD GAS, VENOUS
Acid-Base Excess: 2.9 mmol/L — ABNORMAL HIGH (ref 0.0–2.0)
Bicarbonate: 29.8 mmol/L — ABNORMAL HIGH (ref 20.0–28.0)
O2 Saturation: 38.9 %
Patient temperature: 37
pCO2, Ven: 54 mmHg (ref 44.0–60.0)
pH, Ven: 7.35 (ref 7.250–7.430)

## 2019-05-25 ENCOUNTER — Encounter (HOSPITAL_COMMUNITY): Payer: Self-pay

## 2019-05-25 ENCOUNTER — Other Ambulatory Visit: Payer: Self-pay

## 2019-05-25 ENCOUNTER — Ambulatory Visit (HOSPITAL_COMMUNITY)
Admission: RE | Admit: 2019-05-25 | Discharge: 2019-05-25 | Disposition: A | Discharge status: Home / Self Care | Payer: Medicaid Other | Source: Ambulatory Visit | Attending: Internal Medicine | Admitting: Internal Medicine

## 2019-05-25 DIAGNOSIS — E859 Amyloidosis, unspecified: Secondary | ICD-10-CM

## 2019-05-25 NOTE — Progress Notes (Signed)
Pt arrived for her appointment with new onset cold symptoms as she stated since Monday, November 2nd. Pt stated she has cough and runny/stuffy nose. Pt had diarrhea upon arrival to office and pt had visible heavily watering eyes. Pt had to be reminded to keep her mask pulled up over her nose multiple times for safety.  Dr. Debara Pickett was consulted in reference to same. Dr. Debara Pickett advised that pt be rescheduled due to symptoms and encouraged that pt go to have COVID-19 testing done for safety. Pt acknowledged information and recommendations and pt left office still wearing mask.  I did call and leave message for scheduler to call patient to reschedule appointment allowing patient time to get well.

## 2019-06-02 ENCOUNTER — Inpatient Hospital Stay
Admission: EM | Admit: 2019-06-02 | Discharge: 2019-06-05 | DRG: 291 | Disposition: A | Discharge status: Home / Self Care | Payer: Medicaid Other | Attending: Internal Medicine | Admitting: Internal Medicine

## 2019-06-02 ENCOUNTER — Emergency Department: Payer: Medicaid Other

## 2019-06-02 ENCOUNTER — Other Ambulatory Visit: Payer: Self-pay

## 2019-06-02 DIAGNOSIS — D649 Anemia, unspecified: Secondary | ICD-10-CM

## 2019-06-02 DIAGNOSIS — E876 Hypokalemia: Secondary | ICD-10-CM | POA: Diagnosis present

## 2019-06-02 DIAGNOSIS — G4733 Obstructive sleep apnea (adult) (pediatric): Secondary | ICD-10-CM | POA: Diagnosis present

## 2019-06-02 DIAGNOSIS — E1142 Type 2 diabetes mellitus with diabetic polyneuropathy: Secondary | ICD-10-CM | POA: Diagnosis present

## 2019-06-02 DIAGNOSIS — R0902 Hypoxemia: Secondary | ICD-10-CM

## 2019-06-02 DIAGNOSIS — I160 Hypertensive urgency: Secondary | ICD-10-CM | POA: Diagnosis present

## 2019-06-02 DIAGNOSIS — I509 Heart failure, unspecified: Secondary | ICD-10-CM

## 2019-06-02 DIAGNOSIS — I5031 Acute diastolic (congestive) heart failure: Secondary | ICD-10-CM

## 2019-06-02 DIAGNOSIS — R0602 Shortness of breath: Secondary | ICD-10-CM | POA: Diagnosis not present

## 2019-06-02 DIAGNOSIS — Z794 Long term (current) use of insulin: Secondary | ICD-10-CM

## 2019-06-02 DIAGNOSIS — I110 Hypertensive heart disease with heart failure: Secondary | ICD-10-CM | POA: Diagnosis not present

## 2019-06-02 DIAGNOSIS — Z79899 Other long term (current) drug therapy: Secondary | ICD-10-CM

## 2019-06-02 DIAGNOSIS — R0600 Dyspnea, unspecified: Secondary | ICD-10-CM

## 2019-06-02 DIAGNOSIS — I5043 Acute on chronic combined systolic (congestive) and diastolic (congestive) heart failure: Secondary | ICD-10-CM | POA: Diagnosis present

## 2019-06-02 DIAGNOSIS — I5033 Acute on chronic diastolic (congestive) heart failure: Secondary | ICD-10-CM

## 2019-06-02 DIAGNOSIS — Z20828 Contact with and (suspected) exposure to other viral communicable diseases: Secondary | ICD-10-CM | POA: Diagnosis present

## 2019-06-02 DIAGNOSIS — E1122 Type 2 diabetes mellitus with diabetic chronic kidney disease: Secondary | ICD-10-CM | POA: Diagnosis present

## 2019-06-02 DIAGNOSIS — Z6841 Body Mass Index (BMI) 40.0 and over, adult: Secondary | ICD-10-CM

## 2019-06-02 DIAGNOSIS — Z800 Family history of malignant neoplasm of digestive organs: Secondary | ICD-10-CM

## 2019-06-02 DIAGNOSIS — J9601 Acute respiratory failure with hypoxia: Secondary | ICD-10-CM | POA: Diagnosis present

## 2019-06-02 DIAGNOSIS — N182 Chronic kidney disease, stage 2 (mild): Secondary | ICD-10-CM | POA: Diagnosis present

## 2019-06-02 DIAGNOSIS — I130 Hypertensive heart and chronic kidney disease with heart failure and stage 1 through stage 4 chronic kidney disease, or unspecified chronic kidney disease: Principal | ICD-10-CM | POA: Diagnosis present

## 2019-06-02 DIAGNOSIS — Z9111 Patient's noncompliance with dietary regimen: Secondary | ICD-10-CM

## 2019-06-02 DIAGNOSIS — E785 Hyperlipidemia, unspecified: Secondary | ICD-10-CM | POA: Diagnosis present

## 2019-06-02 DIAGNOSIS — D631 Anemia in chronic kidney disease: Secondary | ICD-10-CM | POA: Diagnosis present

## 2019-06-02 LAB — BASIC METABOLIC PANEL
Anion gap: 10 (ref 5–15)
BUN: 25 mg/dL — ABNORMAL HIGH (ref 6–20)
CO2: 20 mmol/L — ABNORMAL LOW (ref 22–32)
Calcium: 8.6 mg/dL — ABNORMAL LOW (ref 8.9–10.3)
Chloride: 112 mmol/L — ABNORMAL HIGH (ref 98–111)
Creatinine, Ser: 1.7 mg/dL — ABNORMAL HIGH (ref 0.44–1.00)
GFR calc Af Amer: 38 mL/min — ABNORMAL LOW (ref >60)
GFR calc non Af Amer: 33 mL/min — ABNORMAL LOW (ref >60)
Glucose, Bld: 279 mg/dL — ABNORMAL HIGH (ref 70–99)
Potassium: 4.2 mmol/L (ref 3.5–5.1)
Sodium: 142 mmol/L (ref 135–145)

## 2019-06-02 LAB — CBC
HCT: 26.9 % — ABNORMAL LOW (ref 36.0–46.0)
Hemoglobin: 8.3 g/dL — ABNORMAL LOW (ref 12.0–15.0)
MCH: 26.9 pg (ref 26.0–34.0)
MCHC: 30.9 g/dL (ref 30.0–36.0)
MCV: 87.3 fL (ref 80.0–100.0)
Platelets: 188 10*3/uL (ref 150–400)
RBC: 3.08 MIL/uL — ABNORMAL LOW (ref 3.87–5.11)
RDW: 13.7 % (ref 11.5–15.5)
WBC: 4.1 10*3/uL (ref 4.0–10.5)
nRBC: 0 % (ref 0.0–0.2)

## 2019-06-02 LAB — TROPONIN I (HIGH SENSITIVITY): Troponin I (High Sensitivity): 3 ng/L (ref <18)

## 2019-06-02 LAB — BRAIN NATRIURETIC PEPTIDE: B Natriuretic Peptide: 117 pg/mL — ABNORMAL HIGH (ref 0.0–100.0)

## 2019-06-02 MED ORDER — ALBUTEROL SULFATE (2.5 MG/3ML) 0.083% IN NEBU: 2.5000 mg | INHALATION_SOLUTION | Freq: Once | RESPIRATORY_TRACT | Status: DC

## 2019-06-02 MED ORDER — ALBUTEROL SULFATE HFA 108 (90 BASE) MCG/ACT IN AERS
2.0000 | INHALATION_SPRAY | RESPIRATORY_TRACT | Status: DC
  Administered 2019-06-03 – 2019-06-04 (×6): 2 via RESPIRATORY_TRACT
  Filled 2019-06-02: qty 6.7

## 2019-06-02 MED ORDER — FUROSEMIDE 10 MG/ML IJ SOLN
40.0000 mg | Freq: Once | INTRAMUSCULAR | Status: AC
  Administered 2019-06-02: 40 mg via INTRAVENOUS
  Filled 2019-06-02: qty 4

## 2019-06-02 NOTE — ED Notes (Signed)
Pt given remote and iced water.

## 2019-06-02 NOTE — ED Notes (Signed)
ED Provider at bedside. 

## 2019-06-02 NOTE — ED Notes (Signed)
Pt ambulated to toilet without assistance, gait steady 

## 2019-06-02 NOTE — ED Provider Notes (Signed)
[facility] Emergency Department Provider Note ____________________________________________   First MD Initiated Contact with Patient 06/02/19 2159     (approximate)  I have reviewed the triage vital signs and the nursing notes.   HISTORY  Chief Complaint Shortness of Breath    HPI [name] [name] is a 58 y.o. female with PMH as noted below including diabetes and anemia who presents with shortness of breath which she states has been present for the last several weeks, but worsened in the last few days.  She states it is worse with any exertion, and she also has worsened shortness of breath at night or when she tries to lie flat.  She states that she has to lie on her side and using 3 pillows at night.  She reports gradual onset bilateral lower extremity edema.  The patient also has an intermittent cough.  She denies fever or chest pain.  She states that she was told earlier this year that she might have congestive heart failure but then states she did not hear anything about it further.  She has no specific sick contacts with COVID-19.  Past Medical History:  Diagnosis Date  . Anemia   . Chronic hepatitis C without hepatic coma ([place]) 11/09/2016  . Diabetes mellitus   . Fibroids   . 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 Hypertension   . MRSA (methicillin resistant Staphylococcus aureus)   . Trichomonas   . VAGINITIS, BACTERIAL, RECURRENT 08/15/2007   Qualifier: Diagnosis of  By: Radene Ou MD, [place]      Patient Active Problem List   Diagnosis Date Noted  . SOB (shortness of breath) 05/09/2019  . Sleep apnea 05/09/2019  . Hyperglycemia 04/09/2019  . Snoring 03/13/2019  . Symptomatic anemia 08/17/2018  . Acute renal failure (ARF) ([place]) 08/17/2018  . Chronic cystitis 08/03/2018  . Diabetic neuropathy ([place]) 06/16/2017  . Non compliance w medication regimen 04/12/2017  . Family history of colon cancer in mother 11/17/2016   . Dyspareunia in female 11/11/2016  . Screen for colon cancer 11/11/2016  . History of ovarian cyst 11/11/2016  . Chronic hepatitis C without hepatic coma ([place]) 11/09/2016  . Hyperlipidemia 10/07/2016  . Type 2 diabetes mellitus ([place]) 01/29/2014  . BREAST PAIN, BILATERAL 02/19/2010  . ANEMIA, IRON DEFICIENCY 10/03/2009  . LEUKOPENIA, MILD 09/19/2009  . CHEST PAIN UNSPECIFIED 09/19/2009  . LIPOMA 06/18/2009  . 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 TINEA PEDIS 05/07/2009  . SKIN LESION 05/07/2009  . COUGH 05/07/2009  . SUBSTANCE ABUSE, MULTIPLE 02/22/2007  . Essential hypertension 02/22/2007    Past Surgical History:  Procedure Laterality Date  . BREAST BIOPSY Left 2018  . CESAREAN SECTION     breech    Prior to Admission medications   Medication Sig Start Date End Date Taking? Authorizing Provider  albuterol (VENTOLIN HFA) 108 (90 Base) MCG/ACT inhaler Inhale 2 puffs into the lungs every 6 (six) hours as needed for wheezing or shortness of breath. 04/23/19   [name], MD  amLODipine (NORVASC) 10 MG tablet Take 1 tablet (10 mg total) by mouth daily. 01/29/19   [name], MD  atorvastatin (LIPITOR) 40 MG tablet Take 1 tablet (40 mg total) by mouth daily. 01/29/19   [name], MD  blood glucose meter kit and supplies KIT Dispense based on patient and insurance preference. Use up to four times daily as directed. (FOR ICD-9 250.00, 250.01). 04/12/19   Dustin Flock, MD  insulin aspart (NOVOLOG) 100 UNIT/ML injection Inject 6 Units into the skin 3 (three) times daily with meals. 04/23/19 05/23/19  [name], MD  Insulin Glargine (LANTUS SOLOSTAR) 100 UNIT/ML Solostar Pen Inject 65 Units into the skin at bedtime. 01/29/19   [name], MD  labetalol (NORMODYNE) 100 MG tablet Take 1 tablet (100 mg total) by mouth 2 (two) times daily. 05/10/19   Minus Breeding, MD  liraglutide (VICTOZA) 18 MG/3ML SOPN Inject 0.3 mLs (1.8 mg total) into the skin daily with  breakfast. 01/29/19   [name], MD  LYRICA 75 MG capsule Take 1 capsule (75 mg total) by mouth 2 (two) times daily. 03/28/19   [name], MD    Allergies Lisinopril  Family History  Problem Relation Age of Onset  . Colon cancer Mother   . Liver disease Sister   . Other Neg Hx   . Breast cancer Neg Hx   . Esophageal cancer Neg Hx   . Rectal cancer Neg Hx     Social History Social History   Tobacco Use  . Smoking status: Never Smoker  . Smokeless tobacco: Never Used  Substance Use Topics  . Alcohol use: No  . Drug use: No    Types: Cocaine, Marijuana    Comment: remote h/o cocaine and marijuana use    Review of Systems  Constitutional: No fever. Eyes: No redness. ENT: No sore throat. Cardiovascular: Denies chest pain. Respiratory: Positive for shortness of breath. Gastrointestinal: No vomiting or diarrhea.  Genitourinary: Negative for dysuria.  Musculoskeletal: Negative for back pain. Skin: Negative for rash. Neurological: Negative for headache.   ____________________________________________   PHYSICAL EXAM:  VITAL SIGNS: ED Triage Vitals  Enc Vitals Group     BP 06/02/19 1910 (!) 152/74     Pulse Rate 06/02/19 1910 87     Resp 06/02/19 1910 (!) 22     Temp 06/02/19 1910 98.7 F (37.1 C)     Temp Source 06/02/19 1910 Oral     SpO2 06/02/19 1910 94 %     Weight 06/02/19 1908 240 lb (108.9 kg)     Height 06/02/19 1908 5' 4" (1.626 m)     Head Circumference --      Peak Flow --      Pain Score --      Pain Loc --      Pain Edu? --      Excl. in [place]? --     Constitutional: Alert and oriented.  Uncomfortable appearing but in no acute distress. Eyes: Conjunctivae are normal.  Head: Atraumatic. Nose: No congestion/rhinnorhea. Mouth/Throat: Mucous membranes are moist.   Neck: Normal range of motion.  Cardiovascular: Normal rate, regular rhythm. Grossly normal heart sounds.  Good peripheral circulation. Respiratory: Increased respiratory  effort, but speaking in full sentences.  No retractions. Lungs with bilateral rales. Gastrointestinal:  No distention.  Musculoskeletal: 1+ bilateral lower extremity edema.  Extremities warm and well perfused.  Neurologic:  Normal speech and language. No gross focal neurologic deficits are appreciated.  Skin:  Skin is warm and dry. No rash noted. Psychiatric: Mood and affect are normal. Speech and behavior are normal.  ____________________________________________   LABS (all labs ordered are listed, but only abnormal results are displayed)  Labs Reviewed  BASIC METABOLIC PANEL - Abnormal; Notable for the following components:      Result Value   Chloride 112 (*)    CO2 20 (*)    Glucose, Bld 279 (*)    BUN  25 (*)    Creatinine, Ser 1.70 (*)    Calcium 8.6 (*)    GFR calc non Af Amer 33 (*)    GFR calc Af Amer 38 (*)    All other components within normal limits  CBC - Abnormal; Notable for the following components:   RBC 3.08 (*)    Hemoglobin 8.3 (*)    HCT 26.9 (*)    All other components within normal limits  SARS CORONAVIRUS 2 (TAT 6-24 HRS)  BRAIN NATRIURETIC PEPTIDE  TROPONIN I (HIGH SENSITIVITY)   ____________________________________________  EKG  ED ECG REPORT I, Arta Silence, the attending physician, personally viewed and interpreted this ECG.  Date: 06/02/2019 EKG Time: 1912 Rate: 88 Rhythm: normal sinus rhythm QRS Axis: normal Intervals: normal ST/T Wave abnormalities: Nonspecific anterior ST abnormalities Narrative Interpretation: no evidence of acute ischemia  ____________________________________________  RADIOLOGY  CXR: Interstitial prominence, possible edema  ____________________________________________   PROCEDURES  Procedure(s) performed: No  Procedures  Critical Care performed: No ____________________________________________   INITIAL IMPRESSION / ASSESSMENT AND PLAN / ED COURSE  Pertinent labs & imaging results that were  available during my care of the patient were reviewed by me and considered in my medical decision making (see chart for details).  57 year old female with PMH as noted above presents with gradual onset shortness of breath over the last several weeks, more acutely worsened in the last few days.  It is worse with exertion, and she also describes orthopnea.  She has some lower extremity edema.  The patient reports that she was being worked up for CHF but is not sure if she was diagnosed with this.  I reviewed the past medical records in [place].  The patient was admitted in September with hyperglycemic hyperosmolar syndrome, and she was seen in the ED last month with facial swelling.  She has a history of poorly controlled hypertension.  She had an echocardiogram in July of this year showing mildly decreased LVEF.  She was not diagnosed with CHF at that time, and according to Dr. Percival Spanish her dyspnea is likely multifactorial.  Overall, on this presentation, differential includes new onset CHF, acute bronchitis, or less likely atypical pneumonia.  Chest x-ray is most consistent with mild pulmonary edema, and the patient does have rales and peripheral edema on exam.  We will obtain additional lab work-up, give a dose of IV Lasix, and albuterol treatment, and reassess.  ----------------------------------------- 11:14 PM on 06/02/2019 -----------------------------------------  Patient is pending the remainder of her work-up and reassessment after initial treatment.  I signed her out to the oncoming physician Dr. Beather Arbour.  ________________________________  Tamala Ser was evaluated in Emergency Department on 06/02/2019 for the symptoms described in the history of present illness. She was evaluated in the context of the global COVID-19 pandemic, which necessitated consideration that the patient might be at risk for infection with the SARS-CoV-2 virus that causes COVID-19. Institutional protocols and  algorithms that pertain to the evaluation of patients at risk for COVID-19 are in a state of rapid change based on information released by regulatory bodies including the CDC and federal and state organizations. These policies and algorithms were followed during the patient's care in the ED.  ____________________________________________   FINAL CLINICAL IMPRESSION(S) / ED DIAGNOSES  Final diagnoses:  Dyspnea, unspecified type      NEW MEDICATIONS STARTED DURING THIS VISIT:  New Prescriptions   No medications on file     Note:  This document was prepared using Dragon voice recognition  software and may include unintentional dictation errors.    Arta Silence, MD 06/02/19 2315

## 2019-06-02 NOTE — ED Notes (Signed)
Blood drawn and sent to lab. Red and blue tubes sent as well.

## 2019-06-02 NOTE — ED Triage Notes (Signed)
Pt to the er for SOB since Wednesday. Pt has sinus congestion, runny nose, cough and pain in the upper middle chest when coughing. Pt having difficulty breathing when she ambulates. Pt not taking any medications for symptoms at home.

## 2019-06-03 ENCOUNTER — Encounter: Payer: Self-pay | Admitting: Internal Medicine

## 2019-06-03 DIAGNOSIS — N183 Chronic kidney disease, stage 3 unspecified: Secondary | ICD-10-CM | POA: Diagnosis not present

## 2019-06-03 DIAGNOSIS — E785 Hyperlipidemia, unspecified: Secondary | ICD-10-CM

## 2019-06-03 DIAGNOSIS — I5041 Acute combined systolic (congestive) and diastolic (congestive) heart failure: Secondary | ICD-10-CM | POA: Diagnosis not present

## 2019-06-03 DIAGNOSIS — I5021 Acute systolic (congestive) heart failure: Secondary | ICD-10-CM

## 2019-06-03 DIAGNOSIS — D649 Anemia, unspecified: Secondary | ICD-10-CM

## 2019-06-03 DIAGNOSIS — R0902 Hypoxemia: Secondary | ICD-10-CM | POA: Insufficient documentation

## 2019-06-03 DIAGNOSIS — I160 Hypertensive urgency: Secondary | ICD-10-CM | POA: Diagnosis present

## 2019-06-03 DIAGNOSIS — E119 Type 2 diabetes mellitus without complications: Secondary | ICD-10-CM

## 2019-06-03 DIAGNOSIS — I130 Hypertensive heart and chronic kidney disease with heart failure and stage 1 through stage 4 chronic kidney disease, or unspecified chronic kidney disease: Secondary | ICD-10-CM | POA: Diagnosis present

## 2019-06-03 DIAGNOSIS — D631 Anemia in chronic kidney disease: Secondary | ICD-10-CM | POA: Diagnosis present

## 2019-06-03 DIAGNOSIS — I5043 Acute on chronic combined systolic (congestive) and diastolic (congestive) heart failure: Secondary | ICD-10-CM | POA: Diagnosis not present

## 2019-06-03 DIAGNOSIS — G4733 Obstructive sleep apnea (adult) (pediatric): Secondary | ICD-10-CM | POA: Diagnosis present

## 2019-06-03 DIAGNOSIS — I10 Essential (primary) hypertension: Secondary | ICD-10-CM

## 2019-06-03 DIAGNOSIS — I5033 Acute on chronic diastolic (congestive) heart failure: Secondary | ICD-10-CM | POA: Diagnosis not present

## 2019-06-03 DIAGNOSIS — E1142 Type 2 diabetes mellitus with diabetic polyneuropathy: Secondary | ICD-10-CM | POA: Diagnosis present

## 2019-06-03 DIAGNOSIS — E1122 Type 2 diabetes mellitus with diabetic chronic kidney disease: Secondary | ICD-10-CM | POA: Diagnosis present

## 2019-06-03 DIAGNOSIS — Z79899 Other long term (current) drug therapy: Secondary | ICD-10-CM | POA: Diagnosis not present

## 2019-06-03 DIAGNOSIS — Z9111 Patient's noncompliance with dietary regimen: Secondary | ICD-10-CM | POA: Diagnosis not present

## 2019-06-03 DIAGNOSIS — Z794 Long term (current) use of insulin: Secondary | ICD-10-CM | POA: Diagnosis not present

## 2019-06-03 DIAGNOSIS — R0602 Shortness of breath: Secondary | ICD-10-CM | POA: Diagnosis not present

## 2019-06-03 DIAGNOSIS — I509 Heart failure, unspecified: Secondary | ICD-10-CM | POA: Diagnosis not present

## 2019-06-03 DIAGNOSIS — I110 Hypertensive heart disease with heart failure: Secondary | ICD-10-CM | POA: Diagnosis not present

## 2019-06-03 DIAGNOSIS — N182 Chronic kidney disease, stage 2 (mild): Secondary | ICD-10-CM | POA: Diagnosis present

## 2019-06-03 DIAGNOSIS — E876 Hypokalemia: Secondary | ICD-10-CM | POA: Diagnosis present

## 2019-06-03 DIAGNOSIS — Z20828 Contact with and (suspected) exposure to other viral communicable diseases: Secondary | ICD-10-CM | POA: Diagnosis present

## 2019-06-03 DIAGNOSIS — Z6841 Body Mass Index (BMI) 40.0 and over, adult: Secondary | ICD-10-CM | POA: Diagnosis not present

## 2019-06-03 DIAGNOSIS — R079 Chest pain, unspecified: Secondary | ICD-10-CM | POA: Diagnosis not present

## 2019-06-03 DIAGNOSIS — J9601 Acute respiratory failure with hypoxia: Secondary | ICD-10-CM | POA: Diagnosis present

## 2019-06-03 DIAGNOSIS — Z800 Family history of malignant neoplasm of digestive organs: Secondary | ICD-10-CM | POA: Diagnosis not present

## 2019-06-03 LAB — CBC WITH DIFFERENTIAL/PLATELET
Abs Immature Granulocytes: 0.02 10*3/uL (ref 0.00–0.07)
Basophils Absolute: 0 10*3/uL (ref 0.0–0.1)
Basophils Relative: 1 %
Eosinophils Absolute: 0 10*3/uL (ref 0.0–0.5)
Eosinophils Relative: 0 %
HCT: 24.7 % — ABNORMAL LOW (ref 36.0–46.0)
Hemoglobin: 7.7 g/dL — ABNORMAL LOW (ref 12.0–15.0)
Immature Granulocytes: 1 %
Lymphocytes Relative: 37 %
Lymphs Abs: 1.6 10*3/uL (ref 0.7–4.0)
MCH: 26.9 pg (ref 26.0–34.0)
MCHC: 31.2 g/dL (ref 30.0–36.0)
MCV: 86.4 fL (ref 80.0–100.0)
Monocytes Absolute: 0.4 10*3/uL (ref 0.1–1.0)
Monocytes Relative: 8 %
Neutro Abs: 2.4 10*3/uL (ref 1.7–7.7)
Neutrophils Relative %: 53 %
Platelets: 169 10*3/uL (ref 150–400)
RBC: 2.86 MIL/uL — ABNORMAL LOW (ref 3.87–5.11)
RDW: 13.7 % (ref 11.5–15.5)
WBC: 4.4 10*3/uL (ref 4.0–10.5)
nRBC: 0 % (ref 0.0–0.2)

## 2019-06-03 LAB — GLUCOSE, CAPILLARY
Glucose-Capillary: 147 mg/dL — ABNORMAL HIGH (ref 70–99)
Glucose-Capillary: 197 mg/dL — ABNORMAL HIGH (ref 70–99)
Glucose-Capillary: 199 mg/dL — ABNORMAL HIGH (ref 70–99)
Glucose-Capillary: 275 mg/dL — ABNORMAL HIGH (ref 70–99)

## 2019-06-03 LAB — TROPONIN I (HIGH SENSITIVITY): Troponin I (High Sensitivity): 3 ng/L (ref <18)

## 2019-06-03 LAB — PREPARE RBC (CROSSMATCH)

## 2019-06-03 LAB — ABO/RH: ABO/RH(D): A NEG

## 2019-06-03 LAB — SARS CORONAVIRUS 2 (TAT 6-24 HRS): SARS Coronavirus 2: NEGATIVE

## 2019-06-03 MED ORDER — ONDANSETRON HCL 4 MG/2ML IJ SOLN: 4.0000 mg | Freq: Four times a day (QID) | INTRAMUSCULAR | Status: DC | PRN

## 2019-06-03 MED ORDER — HYDRALAZINE HCL 25 MG PO TABS
25.0000 mg | ORAL_TABLET | Freq: Three times a day (TID) | ORAL | Status: DC
  Administered 2019-06-03 – 2019-06-04 (×4): 25 mg via ORAL
  Filled 2019-06-03 (×6): qty 1

## 2019-06-03 MED ORDER — SODIUM CHLORIDE 0.9 % IV BOLUS: Freq: Once | INTRAVENOUS | Status: DC

## 2019-06-03 MED ORDER — ZOLPIDEM TARTRATE 5 MG PO TABS: 5.0000 mg | ORAL_TABLET | Freq: Every evening | ORAL | Status: DC | PRN

## 2019-06-03 MED ORDER — PREGABALIN 75 MG PO CAPS
75.0000 mg | ORAL_CAPSULE | Freq: Two times a day (BID) | ORAL | Status: DC
  Administered 2019-06-03 – 2019-06-05 (×4): 75 mg via ORAL
  Filled 2019-06-03 (×5): qty 1

## 2019-06-03 MED ORDER — SODIUM CHLORIDE 0.9% FLUSH
3.0000 mL | INTRAVENOUS | Status: DC | PRN
  Administered 2019-06-05: 3 mL via INTRAVENOUS
  Filled 2019-06-03: qty 3

## 2019-06-03 MED ORDER — ALPRAZOLAM 0.25 MG PO TABS: 0.2500 mg | ORAL_TABLET | Freq: Two times a day (BID) | ORAL | Status: DC | PRN

## 2019-06-03 MED ORDER — ASPIRIN EC 81 MG PO TBEC
81.0000 mg | DELAYED_RELEASE_TABLET | Freq: Every day | ORAL | Status: DC
  Administered 2019-06-03 – 2019-06-05 (×2): 81 mg via ORAL
  Filled 2019-06-03 (×4): qty 1

## 2019-06-03 MED ORDER — SODIUM CHLORIDE 0.9% FLUSH
3.0000 mL | Freq: Two times a day (BID) | INTRAVENOUS | Status: DC
  Administered 2019-06-03 – 2019-06-05 (×5): 3 mL via INTRAVENOUS

## 2019-06-03 MED ORDER — INSULIN GLARGINE 100 UNIT/ML ~~LOC~~ SOLN
65.0000 [IU] | Freq: Every day | SUBCUTANEOUS | Status: DC
  Administered 2019-06-03 – 2019-06-04 (×2): 65 [IU] via SUBCUTANEOUS
  Filled 2019-06-03 (×4): qty 0.65

## 2019-06-03 MED ORDER — SODIUM CHLORIDE 0.9 % IV SOLN: 250.0000 mL | INTRAVENOUS | Status: DC | PRN

## 2019-06-03 MED ORDER — LABETALOL HCL 100 MG PO TABS
100.0000 mg | ORAL_TABLET | Freq: Two times a day (BID) | ORAL | Status: DC
  Administered 2019-06-03 – 2019-06-05 (×5): 100 mg via ORAL
  Filled 2019-06-03 (×7): qty 1

## 2019-06-03 MED ORDER — ACETAMINOPHEN 325 MG PO TABS: 650.0000 mg | ORAL_TABLET | ORAL | Status: DC | PRN

## 2019-06-03 MED ORDER — ENOXAPARIN SODIUM 40 MG/0.4ML ~~LOC~~ SOLN: 40.0000 mg | SUBCUTANEOUS | Status: DC

## 2019-06-03 MED ORDER — INSULIN ASPART 100 UNIT/ML ~~LOC~~ SOLN
0.0000 [IU] | Freq: Three times a day (TID) | SUBCUTANEOUS | Status: DC
  Administered 2019-06-03: 2 [IU] via SUBCUTANEOUS
  Administered 2019-06-03: 5 [IU] via SUBCUTANEOUS
  Administered 2019-06-03 – 2019-06-04 (×2): 2 [IU] via SUBCUTANEOUS
  Administered 2019-06-04: 3 [IU] via SUBCUTANEOUS
  Administered 2019-06-04 – 2019-06-05 (×2): 2 [IU] via SUBCUTANEOUS
  Filled 2019-06-03 (×6): qty 1

## 2019-06-03 MED ORDER — FUROSEMIDE 10 MG/ML IJ SOLN
40.0000 mg | Freq: Two times a day (BID) | INTRAMUSCULAR | Status: DC
  Administered 2019-06-03 – 2019-06-05 (×4): 40 mg via INTRAVENOUS
  Filled 2019-06-03 (×7): qty 4

## 2019-06-03 MED ORDER — FUROSEMIDE 10 MG/ML IJ SOLN
20.0000 mg | Freq: Once | INTRAMUSCULAR | Status: AC
  Administered 2019-06-04: 20 mg via INTRAVENOUS

## 2019-06-03 MED ORDER — ATORVASTATIN CALCIUM 20 MG PO TABS
40.0000 mg | ORAL_TABLET | Freq: Every day | ORAL | Status: DC
  Administered 2019-06-03 – 2019-06-04 (×2): 40 mg via ORAL
  Filled 2019-06-03 (×3): qty 2

## 2019-06-03 MED ORDER — ALBUTEROL SULFATE HFA 108 (90 BASE) MCG/ACT IN AERS
2.0000 | INHALATION_SPRAY | Freq: Four times a day (QID) | RESPIRATORY_TRACT | Status: DC | PRN
  Filled 2019-06-03: qty 6.7

## 2019-06-03 MED ORDER — ENOXAPARIN SODIUM 40 MG/0.4ML ~~LOC~~ SOLN
40.0000 mg | SUBCUTANEOUS | Status: DC
  Administered 2019-06-03 – 2019-06-04 (×2): 40 mg via SUBCUTANEOUS
  Filled 2019-06-03 (×3): qty 0.4

## 2019-06-03 MED ORDER — AMLODIPINE BESYLATE 10 MG PO TABS
10.0000 mg | ORAL_TABLET | Freq: Every day | ORAL | Status: DC
  Administered 2019-06-03 – 2019-06-05 (×3): 10 mg via ORAL
  Filled 2019-06-03: qty 1
  Filled 2019-06-03 (×2): qty 2

## 2019-06-03 NOTE — ED Notes (Addendum)
Pt O2 down to to 89% on RA, placed on 2L and elevated HOB, O2 now 97%

## 2019-06-03 NOTE — ED Notes (Signed)
Pt returned to bed and reconnected to the monitor.

## 2019-06-03 NOTE — ED Notes (Signed)
Pt ambulated to restroom, NAD noted

## 2019-06-03 NOTE — ED Notes (Signed)
[id number]  

## 2019-06-03 NOTE — Consult Note (Addendum)
Cardiology Consultation:   Patient ID: [name] [name]; [id number]; 05-26-1961   Admit date: 06/02/2019 Date of Consult: 06/03/2019  Primary Care Provider: [name], MD Primary Cardiologist: Hochrein   Patient Profile:   [name] [name] is a 58 y.o. female with a hx of HFpEF, systolic dysfunction, anemia, hepatitis C, diabetes, CKD stage III, HTN, morbid obesity with diagnosed sleep apnea not on BiPAP, and possible OHS who is being seen today for the evaluation of dyspnea and chest pain at the request of Dr. Sidney Ace.  History of Present Illness:   Ms. Heaphy was previously followed by cardiology for difficult to control hypertension.  More recently, she underwent echo in 01/2019 which showed mild systolic dysfunction with an EF of 50%, mild diffuse hypokinesis, moderate concentric LVH, diastolic dysfunction, elevated left atrial and left ventricular EDP, normal RV systolic function and cavity size, mildly dilated left atrium, mild aortic valve regurgitation.  In this setting, she was referred to cardiology and evaluated on 05/10/2019 in the setting of dyspnea.  She had not been checking her blood pressures at home.  Of note, she had recently been started on lisinopril though had an allergic reaction to this with angioedema.  She reported shortness of breath with walking 50 yards on level ground.  She was noted to have an ambulatory oxygen saturation in the 90s on room air.  Weight in our office was 107 kg.  She was scheduled for PYP.  However, upon presenting for this study on 05/25/2019 it was deferred secondary to patient compliance issues with facial covering as well as symptoms consistent with URI.  She was advised to follow-up with her PCP for Covid testing though did not do so.  Patient has noted a 1 to 2-week history of progressive shortness of breath on top of her baseline dyspnea as well as lower extremity swelling along with exertional chest pain.  She has stable two-pillow  orthopnea.  She does not follow a low-sodium diet.  She continues to note significant nasal congestion and a cough productive of yellow sputum.  Of note, patient reports a 1 month history of dark stools which have not been formally evaluated.  Upon the patient's arrival to [facility] they were found to have BP in the 157W systolic though trending to the 190s, HR 80s to 90s bpm, temp afebrile, oxygen saturation 94% on room air requiring supplemental oxygen, weight documented at 108.9 kg. EKG showed sinus rhythm with nonspecific changes as outlined below, CXR showed borderline heart size as well as interstitial prominence possibly reflecting early interstitial edema. Labs showed initial hemoglobin of 8.3 trending to 7.7 with a baseline of 10-11, high-sensitivity troponin of 3 with a delta of 3, BNP 117, potassium 4.2, BUN 25, serum creatinine 1.7 with a baseline appearing to be somewhere between 1.3 and 1.9.  COVID-19 testing is pending.  No documented urine output.  Currently, patient with supplemental oxygen being supplied through the mouth via nasal cannula.  She continues to note lower extremity swelling though denies any significant orthopnea as she is lying nearly supine without worsening symptoms.  Currently chest pain-free.  Past Medical History:  Diagnosis Date  . Anemia   . Chronic hepatitis C without hepatic coma ([place]) 11/09/2016  . Diabetes mellitus   . Fibroids   . 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 Hypertension   . MRSA (methicillin resistant Staphylococcus aureus)   . Trichomonas   . VAGINITIS, BACTERIAL, RECURRENT  08/15/2007   Qualifier: Diagnosis of  By: Radene Ou MD, [place]      Past Surgical History:  Procedure Laterality Date  . BREAST BIOPSY Left 2018  . CESAREAN SECTION     breech     Home Meds: Prior to Admission medications   Medication Sig Start Date End Date Taking? Authorizing Provider  albuterol (VENTOLIN HFA) 108 (90 Base) MCG/ACT  inhaler Inhale 2 puffs into the lungs every 6 (six) hours as needed for wheezing or shortness of breath. 04/23/19  Yes [name], MD  amLODipine (NORVASC) 10 MG tablet Take 1 tablet (10 mg total) by mouth daily. 01/29/19  Yes [name], MD  atorvastatin (LIPITOR) 40 MG tablet Take 1 tablet (40 mg total) by mouth daily. 01/29/19  Yes Newlin, Charlane Ferretti, MD  insulin aspart (NOVOLOG) 100 UNIT/ML injection Inject 6 Units into the skin 3 (three) times daily before meals.   Yes [provider]  Insulin Glargine (LANTUS SOLOSTAR) 100 UNIT/ML Solostar Pen Inject 65 Units into the skin at bedtime. 01/29/19  Yes [name], MD  labetalol (NORMODYNE) 100 MG tablet Take 1 tablet (100 mg total) by mouth 2 (two) times daily. 05/10/19  Yes Minus Breeding, MD  LYRICA 75 MG capsule Take 1 capsule (75 mg total) by mouth 2 (two) times daily. 03/28/19  Yes [name], MD  blood glucose meter kit and supplies KIT Dispense based on patient and insurance preference. Use up to four times daily as directed. (FOR ICD-9 250.00, 250.01). 04/12/19   Dustin Flock, MD    Inpatient Medications: Scheduled Meds: . albuterol  2 puff Inhalation Q4H  . amLODipine  10 mg Oral Daily  . aspirin EC  81 mg Oral Daily  . atorvastatin  40 mg Oral q1800  . enoxaparin (LOVENOX) injection  40 mg Subcutaneous Q24H  . furosemide  40 mg Intravenous Q12H  . insulin aspart  0-9 Units Subcutaneous TID WC  . Insulin Glargine  65 Units Subcutaneous QHS  . labetalol  100 mg Oral BID  . pregabalin  75 mg Oral BID  . sodium chloride flush  3 mL Intravenous Q12H   Continuous Infusions: . sodium chloride     PRN Meds: sodium chloride, acetaminophen, albuterol, ALPRAZolam, ondansetron (ZOFRAN) IV, sodium chloride flush, zolpidem  Allergies:   Allergies  Allergen Reactions  . Lisinopril Swelling    Social History:   Social History   Socioeconomic History  . Marital status: Legally Separated    Spouse name: Not  on file  . Number of children: Not on file  . Years of education: Not on file  . Highest education level: Not on file  Occupational History  . Occupation: disability  Social Needs  . Financial resource strain: Not on file  . Food insecurity    Worry: Sometimes true    Inability: Often true  . Transportation needs    Medical: No    Non-medical: No  Tobacco Use  . Smoking status: Never Smoker  . Smokeless tobacco: Never Used  Substance and Sexual Activity  . Alcohol use: No  . Drug use: No    Types: Cocaine, Marijuana    Comment: remote h/o cocaine and marijuana use  . Sexual activity: Yes    Birth control/protection: None  Lifestyle  . Physical activity    Days per week: Not on file    Minutes per session: Not on file  . Stress: Not on file  Relationships  . Social Herbalist on  phone: Not on file    Gets together: Not on file    Attends religious service: Not on file    Active member of club or organization: Not on file    Attends meetings of clubs or organizations: Not on file    Relationship status: Not on file  . Intimate partner violence    Fear of current or ex partner: Not on file    Emotionally abused: Not on file    Physically abused: Not on file    Forced sexual activity: Not on file  Other Topics Concern  . Not on file  Social History Narrative   Lives with friend, Rosalee Kaufman and nephew.  One daughter.      Family History:   Family History  Problem Relation Age of Onset  . Colon cancer Mother   . Liver disease Sister   . Other Neg Hx   . Breast cancer Neg Hx   . Esophageal cancer Neg Hx   . Rectal cancer Neg Hx     ROS:  Review of Systems  Constitutional: Positive for malaise/fatigue. Negative for chills, diaphoresis, fever and weight loss.  HENT: Positive for congestion.   Eyes: Negative for discharge and redness.  Respiratory: Positive for cough, sputum production and shortness of breath. Negative for hemoptysis and wheezing.         Yellow sputum  Cardiovascular: Positive for chest pain and leg swelling. Negative for palpitations, orthopnea, claudication and PND.  Gastrointestinal: Positive for melena. Negative for abdominal pain, blood in stool, constipation, diarrhea, heartburn, nausea and vomiting.  Genitourinary: Negative for hematuria.  Musculoskeletal: Negative for falls and myalgias.  Skin: Negative for rash.  Neurological: Positive for weakness. Negative for dizziness, tingling, tremors, sensory change, speech change, focal weakness and loss of consciousness.  Endo/Heme/Allergies: Does not bruise/bleed easily.  Psychiatric/Behavioral: Negative for substance abuse. The patient is not nervous/anxious.   All other systems reviewed and are negative.     Physical Exam/Data:   Vitals:   06/03/19 0600 06/03/19 0730 06/03/19 0800 06/03/19 0830  BP: (!) 188/98 (!) 142/86 (!) 153/84 (!) 166/98  Pulse: 81 77 80 88  Resp: 20 20    Temp:      TempSrc:      SpO2: 95% 93% 95% (!) 78%  Weight:      Height:       No intake or output data in the 24 hours ending 06/03/19 1236 Filed Weights   06/02/19 1908  Weight: 108.9 kg   Body mass index is 41.2 kg/m.   Physical Exam: General: Well developed, well nourished, in no acute distress. Head: Normocephalic, atraumatic, sclera non-icteric, no xanthomas, nares without discharge.  Neck: Negative for carotid bruits. JVD not elevated. Lungs: Diminished breath sounds bilaterally along the bases. Breathing is unlabored with supplemental oxygen noted taped to the patient's mouth. Heart: RRR with S1 S2. No murmurs, rubs, or gallops appreciated. Abdomen: Obese, soft, non-tender, non-distended with normoactive bowel sounds. No hepatomegaly. No rebound/guarding. No obvious abdominal masses. Msk:  Strength and tone appear normal for age. Extremities: No clubbing or cyanosis.  1+ bilateral lower extremity pitting edema. Distal pedal pulses are 2+ and equal bilaterally. Neuro: Alert  and oriented X 3. No facial asymmetry. No focal deficit. Moves all extremities spontaneously. Psych:  Responds to questions appropriately with a normal affect.   EKG:  The EKG was personally reviewed and demonstrates: NSR, 88 bpm, cannot rule out prior anteroseptal infarct, no acute ST-T changes Telemetry:  Telemetry was  personally reviewed and demonstrates: SR, 80s bpm  Weights: Filed Weights   06/02/19 1908  Weight: 108.9 kg    Relevant CV Studies: 2D echo 01/2019: 1. The left ventricle has mildly decreased systolic function, with an ejection fraction of 50% with mild diffuse hypokinesis. The cavity size was normal. There is moderate concentric left ventricular hypertrophy. Left ventricular diastolic Doppler  parameters are consistent with impaired relaxation. Elevated left atrial and left ventricular end-diastolic pressures.  2. The right ventricle has normal systolic function. The cavity was normal. There is no increase in right ventricular wall thickness.  3. Left atrial size was mildly dilated.  4. Mild thickening of the aortic valve. Aortic valve regurgitation is mild by color flow Doppler.  Laboratory Data:  Chemistry Recent Labs  Lab 06/02/19 1919  NA 142  K 4.2  CL 112*  CO2 20*  GLUCOSE 279*  BUN 25*  CREATININE 1.70*  CALCIUM 8.6*  GFRNONAA 33*  GFRAA 38*  ANIONGAP 10    No results for input(s): PROT, ALBUMIN, AST, ALT, ALKPHOS, BILITOT in the last 168 hours. Hematology Recent Labs  Lab 06/02/19 1919 06/03/19 0811  WBC 4.1 4.4  RBC 3.08* 2.86*  HGB 8.3* 7.7*  HCT 26.9* 24.7*  MCV 87.3 86.4  MCH 26.9 26.9  MCHC 30.9 31.2  RDW 13.7 13.7  PLT 188 169   Cardiac EnzymesNo results for input(s): TROPONINI in the last 168 hours. No results for input(s): TROPIPOC in the last 168 hours.  BNP Recent Labs  Lab 06/02/19 1919  BNP 117.0*    DDimer No results for input(s): DDIMER in the last 168 hours.  Radiology/Studies:  Dg Chest 2 View  Result Date:  06/02/2019 IMPRESSION: Borderline heart size. Interstitial prominence could reflect early interstitial edema. Electronically Signed   By: Rolm Baptise M.D.   On: 06/02/2019 20:22    Assessment and Plan:   1.  Dyspnea/chest pain: -Likely multifactorial including acute on chronic diastolic CHF, worsening anemia, morbid obesity, untreated OSA with likely OHS, poorly controlled hypertension, and physical deconditioning -COVID-19 pending -Continue supplemental oxygen as needed -Diuresis as below -Recommend further evaluation of anemia as outlined below -Unfortunately, she was unable to undergo PYP on 11/6 last on 22nd URI symptoms, this Kingdavid Leinbach need to be followed up upon as an outpatient with her primary cardiologist -High-sensitivity troponin negative x2 making ACS very unlikely -Following further evaluation and treatment of her anemia, consider  Myoview with timing to be determined based on evaluation of anemia  2.  Acute on chronic HFpEF with systolic dysfunction: -Patient is volume overloaded with vascular congestion noted on chest x-ray and lower extremity swelling on exam -Likely exacerbated by anemia and poorly controlled hypertension, cannot exclude dietary noncompliance -Continue IV Lasix 40 mg twice daily with KCl repletion as indicated -Check LFT to evaluate albumin -Cannot exclude some degree of third spacing secondary to anemia -Recent echo as outlined above, Lyfe Monger update to evaluate for any changes in her EF  3.  Acute on chronic anemia of chronic disease: -Hemoglobin has trended from a baseline of 10-11 to a current value of 7.7 -Likely contributing to her overall presentation -Patient reports an approximate 1 month history of melena -Recommend further evaluation per internal medicine  4.  HTN: -Blood pressure is suboptimally controlled -May not be an ideal candidate for amlodipine in the long run given body habitus and lower extremity swelling, this can be addressed moving  forward -Continue Lasix and labetalol -Not on ACE inhibitor/ARB secondary to prior  angioedema noted with lisinopril -Add hydralazine  5.  Morbid obesity with diagnosed OSA and likely OHS: -Has been found to have sleep apnea though does not have BiPAP yet, this Akansha Wyche need to be followed up with as an outpatient -Weight loss advised  6.  CKD stage III: -Baseline serum creatinine has varied considerably -Appears relatively stable -Monitor with diuresis   For questions or updates, please contact [place] Please consult [email or web address] for contact info under Cardiology/STEMI.   Signed, Christell Faith, PA-C [facility] Pager: [phone] 06/03/2019, 12:36 PM  I have seen and examined this patient with Christell Faith.  Agree with above, note added to reflect my findings.  On exam, presented to the hospital with shortness of breath.  Has been diuresed with Lasix and is now feeling much improved.  Agree with continued diuresis.  She does have obstructive sleep apnea but has yet to get her BiPAP.  I have told her that is very important to get that arranged.  Blood pressure remains significantly elevated.  Jester Klingberg likely need titration of hydralazine and labetalol.  Improved control of sleep apnea Lalitha Ilyas greatly improve her blood pressure control.  Chelle Cayton M. Sunil Hue MD 06/03/2019 1:45 PM

## 2019-06-03 NOTE — ED Notes (Signed)
Pt O2 sats have dropped to 77 with a good wave form. Into room, pt is sleeping soundly. Pt has pulled off her oxygen. Oxygen reapplied and pt encouraged to take deep breaths. Pt has nasal congestion. Pt placed on 3L and sats returned to 95%.

## 2019-06-03 NOTE — ED Notes (Signed)
Pt ambulatory to BR without assistance.  Upon return, pt provided lunch tray and is sitting up to chair.  Pt denies c/o at this time.  Will continue to monitor.

## 2019-06-03 NOTE — ED Notes (Signed)
Pt given meal tray that RN ensured was a warm chicken sandwich. Pt sitting in bed and watching TV at this time. Pt frustrated with wait for call bell to be answered earlier in the day RN apologized and ensured call bell in reach. Pt requesting door to be closed. Lights dimmed.

## 2019-06-03 NOTE — ED Notes (Signed)
Patient is resting comfortably. 

## 2019-06-03 NOTE — ED Notes (Signed)
Pt given meal tray and something to drink.

## 2019-06-03 NOTE — H&P (Signed)
[facility] NAME: [name] [name]    MR#:  [id number]  DATE OF BIRTH:  [birth year]  DATE OF ADMISSION:  06/02/2019  PRIMARY CARE PHYSICIAN: [name], MD   REQUESTING/REFERRING PHYSICIAN: Lurline Hare, MD  CHIEF COMPLAINT:   Chief Complaint  Patient presents with  . Shortness of Breath    HISTORY OF PRESENT ILLNESS:  [name] [name]  is a 58 y.o. African-American female with a known history of type 2 diabetes mellitus, hypertension, diastolic dysfunction and systolic hypokinesia, who presented to the emergency room with acute onset of worsening dyspnea with associated lower extremity edema over the last couple weeks  remarkably deteriorated over the last 3 days with associated orthopnea and intermittent cough occasionally productive of yellowish sputum and dyspnea on exertion paroxysmal nocturnal dyspnea.  No fever or chills.  No chest pain or palpitations.  No nausea, vomiting or abdominal pain.  No headache or dizziness or blurred vision.  Upon presentation to the emergency room, blood pressure was 152/74 and respiratory rate 22 with a pulse oximetry of 94% on room air.  CMP showed a CO2 of 20 and glucose of 279 with chloride 112, BUN 25 and creatinine 1.7 calcium was 8.6.  BNP was 117 and high-sensitivity troponin I was 3.  CBC showed anemia with hemoglobin 8.3 and hematocrit 26.9 compared to 9.1 and 27.9 on 04/10/2019.Marland Kitchen  COVID-19 is currently pending.  Two-view chest x-ray showed borderline heart size and interstitial prominence reflect early interstitial edema.  EKG showed normal sinus rhythm with rate 88 with poor R wave progression.  The patient was given 40 mg of IV Lasix and 2 puffs of albuterol.  She will be admitted to a telemetry bed for further evaluation and management.  PAST MEDICAL HISTORY:   Past Medical History:  Diagnosis Date  . Anemia   . Chronic hepatitis C without hepatic coma ([place]) 11/09/2016  . Diabetes mellitus   .  Fibroids   . 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 Hypertension   . MRSA (methicillin resistant Staphylococcus aureus)   . Trichomonas   . VAGINITIS, BACTERIAL, RECURRENT 08/15/2007   Qualifier: Diagnosis of  By: Radene Ou MD, [place]    Diastolic dysfunction and systolic hypokinesia ICD echo in July 2020 showed EF of 50%  PAST SURGICAL HISTORY:   Past Surgical History:  Procedure Laterality Date  . BREAST BIOPSY Left 2018  . CESAREAN SECTION     breech    SOCIAL HISTORY:   Social History   Tobacco Use  . Smoking status: Never Smoker  . Smokeless tobacco: Never Used  Substance Use Topics  . Alcohol use: No    FAMILY HISTORY:   Family History  Problem Relation Age of Onset  . Colon cancer Mother   . Liver disease Sister   . Other Neg Hx   . Breast cancer Neg Hx   . Esophageal cancer Neg Hx   . Rectal cancer Neg Hx     DRUG ALLERGIES:   Allergies  Allergen Reactions  . Lisinopril Swelling    REVIEW OF SYSTEMS:   ROS As per history of present illness. All pertinent systems were reviewed above. Constitutional,  HEENT, cardiovascular, respiratory, GI, GU, musculoskeletal, neuro, psychiatric, endocrine,  integumentary and hematologic systems were reviewed and are otherwise  negative/unremarkable except for positive findings mentioned above in the HPI.   MEDICATIONS AT HOME:   Prior to Admission medications  Medication Sig Start Date End Date Taking? Authorizing Provider  albuterol (VENTOLIN HFA) 108 (90 Base) MCG/ACT inhaler Inhale 2 puffs into the lungs every 6 (six) hours as needed for wheezing or shortness of breath. 04/23/19  Yes [name], MD  amLODipine (NORVASC) 10 MG tablet Take 1 tablet (10 mg total) by mouth daily. 01/29/19  Yes [name], MD  atorvastatin (LIPITOR) 40 MG tablet Take 1 tablet (40 mg total) by mouth daily. 01/29/19  Yes Newlin, Charlane Ferretti, MD  insulin aspart (NOVOLOG) 100 UNIT/ML injection Inject  6 Units into the skin 3 (three) times daily before meals.   Yes [provider]  Insulin Glargine (LANTUS SOLOSTAR) 100 UNIT/ML Solostar Pen Inject 65 Units into the skin at bedtime. 01/29/19  Yes [name], MD  labetalol (NORMODYNE) 100 MG tablet Take 1 tablet (100 mg total) by mouth 2 (two) times daily. 05/10/19  Yes Minus Breeding, MD  LYRICA 75 MG capsule Take 1 capsule (75 mg total) by mouth 2 (two) times daily. 03/28/19  Yes [name], MD  blood glucose meter kit and supplies KIT Dispense based on patient and insurance preference. Use up to four times daily as directed. (FOR ICD-9 250.00, 250.01). 04/12/19   Dustin Flock, MD      VITAL SIGNS:  Blood pressure (!) 172/88, pulse 85, temperature 98.7 F (37.1 C), temperature source Oral, resp. rate 20, height '5\' 4"'  (1.626 m), weight 108.9 kg, last menstrual period 04/01/2010, SpO2 (!) 89 %.  PHYSICAL EXAMINATION:  Physical Exam GENERAL:  58 y.o.-year-old African-American female patient lying in the bed with no acute distress.  EYES: Pupils equal, round, reactive to light and accommodation. No scleral icterus. Extraocular muscles intact.  HEENT: Head atraumatic, normocephalic. Oropharynx and nasopharynx clear.  NECK:  Supple, no jugular venous distention. No thyroid enlargement, no tenderness.  LUNGS:  CARDIOVASCULAR: Regular rate and rhythm, S1, S2 normal. No murmurs, rubs, or gallops.  ABDOMEN: Soft, nondistended, nontender. Bowel sounds present. No organomegaly or mass.  EXTREMITIES:   edema with no cyanosis, or clubbing.  NEUROLOGIC: Cranial nerves II through XII are intact. Muscle strength 5/5 in all extremities. Sensation intact. Gait not checked.  PSYCHIATRIC: The patient is alert and oriented x 3.  Normal affect and good eye contact. SKIN: No obvious rash, lesion, or ulcer.   LABORATORY PANEL:   CBC Recent Labs  Lab 06/02/19 1919  WBC 4.1  HGB 8.3*  HCT 26.9*  PLT 188    ------------------------------------------------------------------------------------------------------------------  Chemistries  Recent Labs  Lab 06/02/19 1919  NA 142  K 4.2  CL 112*  CO2 20*  GLUCOSE 279*  BUN 25*  CREATININE 1.70*  CALCIUM 8.6*   ------------------------------------------------------------------------------------------------------------------  Cardiac Enzymes No results for input(s): TROPONINI in the last 168 hours. ------------------------------------------------------------------------------------------------------------------  RADIOLOGY:  Dg Chest 2 View  Result Date: 06/02/2019 CLINICAL DATA:  Shortness of breath EXAM: CHEST - 2 VIEW COMPARISON:  04/09/2019 FINDINGS: Heart is borderline in size. Interstitial prominence throughout the lungs. No confluent opacities, effusions or acute bony abnormality. IMPRESSION: Borderline heart size. Interstitial prominence could reflect early interstitial edema. Electronically Signed   By: Rolm Baptise M.D.   On: 06/02/2019 20:22      IMPRESSION AND PLAN:   1.  Acute on chronic systolic and diastolic CHF.  The patient will be admitted to a telemetry bed and will be diuresed with IV Lasix.  Will follow serial cardiac enzymes.  The patient had a recent 2D echo that revealed an EF of 50% with  systolic hypokinesia and impaired diastolic relaxation with mild aortic regurgitation. Will obtain a cardiology consultation in a.m. I notified Dr. Curt Bears about the patient.  2.  Uncontrolled hypertension.  The patient will be placed on as needed IV labetalol and will continue amlodipine and p.o. labetalol.  3.  Type 2 diabetes mellitus.  The patient will be placed on supplemental coverage with NovoLog and continue her basal Lantus.  4.  Dyslipidemia.  We will continue statin therapy  5.  Peripheral neuropathy.  We will continue Lyrica.  6.  Anemia.  Will monitor H&H.  This is slightly worse from last H&H.  7.  DVT  prophylaxis.  Subtenons Lovenox  . All the records are reviewed and case discussed with ED provider. The plan of care was discussed in details with the patient (and family). I answered all questions. The patient agreed to proceed with the above mentioned plan. Further management will depend upon hospital course.   CODE STATUS: Full code  TOTAL TIME TAKING CARE OF THIS PATIENT: 55 minutes.    Christel Mormon M.D on 06/03/2019 at 1:50 AM  [facility]   From 7 PM-7 AM, contact night-coverage [email or web address]  CC: Primary care physician; [name], MD   Note: This dictation was prepared with Dragon dictation along with smaller phrase technology. Any transcriptional errors that result from this process are unintentional.

## 2019-06-03 NOTE — ED Notes (Signed)
RN called lab to check on blood. Testing almost finished. Lab reports they will call to update as soon as blood is ready for transfusion.

## 2019-06-03 NOTE — ED Notes (Signed)
Report to [employer]

## 2019-06-03 NOTE — ED Notes (Signed)
Pt ambulated to bathroom without assistance. Pt given a gown, wash clothes, towels, and toiletries so she could wash up.

## 2019-06-03 NOTE — ED Provider Notes (Signed)
-----------------------------------------   12:52 AM on 06/03/2019 -----------------------------------------  Patient ambulated to restroom.  Tachypneic and hypoxic to 88% on room air when she returned to the bed.  Will discuss with hospitalist services to evaluate patient in the emergency department for admission.   Paulette Blanch, MD 06/03/19 [phone]

## 2019-06-03 NOTE — ED Notes (Addendum)
Report received from [place], [place]. Pt resting comfortably in the ED stretcher. ED stretcher locked in the lowest position. Introduced self to pt. Pt has no complaints at this time and denies pain. V/S WNL. Pt A&Ox4. Will continue to monitor

## 2019-06-03 NOTE — Progress Notes (Addendum)
Progress Note    [name] [name]  WKM:[id number] DOB: Nov 16, 1960  DOA: 06/02/2019 PCP: [name], MD      Brief Narrative:    Medical records reviewed and are as summarized below:  [name] [name] is an 57 y.o. female with history of type II DM, hypertension, chronic systolic and diastolic dysfunction, morbid obesity, CKD stage III, presented to the hospital with worsening shortness of breath,, orthopnea, PND, productive cough and lower extremity edema.  She was admitted to the hospital for acute CHF exacerbation.      Assessment/Plan:   Active Problems:   Acute CHF (congestive heart failure) (HCC)   Body mass index is 41.2 kg/m.    Acute exacerbation of chronic systolic and diastolic CHF: Continue IV Lasix.  Monitor daily weight, BMP and urine output.  Recent echo in July 2020 showed EF of 50% with mild diffuse hypokinesis, moderate concentric LVH, diastolic dysfunction.  Repeat 2D echo is pending.  Follow-up with cardiologist.  Acute hypoxemic respiratory failure: Improving.  Taper off oxygen via nasal cannula as able.    Acute on chronic anemia: Anemia is probably contributing to CHF exacerbation.  Transfused 1 unit of packed red blood cells.  Risks, benefits and alternatives to blood transfusion were discussed and patient is agreeable to blood transfusion.   Hypertensive urgency: BP is improving.  Continue antihypertensives.  Peripheral neuropathy: Continue Lyrica  Type 2 diabetes mellitus: Continue Lantus and NovoLog.  CKD stage III: Creatinine stable.  Monitor BMP on diuresis.  Morbid obesity: Weight loss advised.    Family Communication/Anticipated D/C date and plan/Code Status   DVT prophylaxis: Lovenox Code Status: Full code Family Communication: Plan discussed with the patient and her husband Disposition Plan: Home in 2 to 3 days      Subjective:   She complains of shortness of breath and she is feeling a little better than she did last  night.  Objective:    Vitals:   06/03/19 0800 06/03/19 0830 06/03/19 1200 06/03/19 1300  BP: (!) 153/84 (!) 166/98    Pulse: 80 88 85 85  Resp:   17 14  Temp:      TempSrc:      SpO2: 95% (!) 78% 100% 96%  Weight:      Height:       No intake or output data in the 24 hours ending 06/03/19 1546 Filed Weights   06/02/19 1908  Weight: 108.9 kg    Exam:  GEN: NAD SKIN: No rash EYES: EOMI ENT: MMM CV: RRR PULM: CTA B ABD: soft, obese, NT, +BS CNS: AAO x 3, non focal EXT: No edema or tenderness   Data Reviewed:   I have personally reviewed following labs and imaging studies:  Labs: Labs show the following:   Basic Metabolic Panel: Recent Labs  Lab 06/02/19 1919  NA 142  K 4.2  CL 112*  CO2 20*  GLUCOSE 279*  BUN 25*  CREATININE 1.70*  CALCIUM 8.6*   GFR Estimated Creatinine Clearance: 43.5 mL/min (A) (by C-G formula based on SCr of 1.7 mg/dL (H)). Liver Function Tests: No results for input(s): AST, ALT, ALKPHOS, BILITOT, PROT, ALBUMIN in the last 168 hours. No results for input(s): LIPASE, AMYLASE in the last 168 hours. No results for input(s): AMMONIA in the last 168 hours. Coagulation profile No results for input(s): INR, PROTIME in the last 168 hours.  CBC: Recent Labs  Lab 06/02/19 1919 06/03/19 0811  WBC 4.1 4.4  NEUTROABS  --  2.4  HGB 8.3* 7.7*  HCT 26.9* 24.7*  MCV 87.3 86.4  PLT 188 169   Cardiac Enzymes: No results for input(s): CKTOTAL, CKMB, CKMBINDEX, TROPONINI in the last 168 hours. BNP (last 3 results) No results for input(s): PROBNP in the last 8760 hours. CBG: Recent Labs  Lab 06/03/19 0050 06/03/19 0809 06/03/19 1250  GLUCAP 147* 197* 199*   D-Dimer: No results for input(s): DDIMER in the last 72 hours. Hgb A1c: No results for input(s): HGBA1C in the last 72 hours. Lipid Profile: No results for input(s): CHOL, HDL, LDLCALC, TRIG, CHOLHDL, LDLDIRECT in the last 72 hours. Thyroid function studies: No results for  input(s): TSH, T4TOTAL, T3FREE, THYROIDAB in the last 72 hours.  Invalid input(s): FREET3 Anemia work up: No results for input(s): VITAMINB12, FOLATE, FERRITIN, TIBC, IRON, RETICCTPCT in the last 72 hours. Sepsis Labs: Recent Labs  Lab 06/02/19 1919 06/03/19 0811  WBC 4.1 4.4    Microbiology Recent Results (from the past 240 hour(s))  SARS CORONAVIRUS 2 (TAT 6-24 HRS) Nasopharyngeal Nasopharyngeal Swab     Status: None   Collection Time: 06/02/19 10:38 PM   Specimen: Nasopharyngeal Swab  Result Value Ref Range Status   SARS Coronavirus 2 NEGATIVE NEGATIVE Final    Comment: (NOTE) SARS-CoV-2 target nucleic acids are NOT DETECTED. The SARS-CoV-2 RNA is generally detectable in upper and lower respiratory specimens during the acute phase of infection. Negative results do not preclude SARS-CoV-2 infection, do not rule out co-infections with other pathogens, and should not be used as the sole basis for treatment or other patient management decisions. Negative results must be combined with clinical observations, patient history, and epidemiological information. The expected result is Negative. Fact Sheet for Patients: SugarRoll.be Fact Sheet for Healthcare Providers: https://www.woods-mathews.com/ This test is not yet approved or cleared by the [place] FDA and  has been authorized for detection and/or diagnosis of SARS-CoV-2 by FDA under an Emergency Use Authorization (EUA). This EUA will remain  in effect (meaning this test can be used) for the duration of the COVID-19 declaration under Section 56 4(b)(1) of the Act, 21 U.S.C. section 360bbb-3(b)(1), unless the authorization is terminated or revoked sooner. Performed at [facility], [place] [address], [place], [place] [address]     Procedures and diagnostic studies:  Dg Chest 2 View  Result Date: 06/02/2019 CLINICAL DATA:  Shortness of breath EXAM: CHEST - 2 VIEW  COMPARISON:  04/09/2019 FINDINGS: Heart is borderline in size. Interstitial prominence throughout the lungs. No confluent opacities, effusions or acute bony abnormality. IMPRESSION: Borderline heart size. Interstitial prominence could reflect early interstitial edema. Electronically Signed   By: Rolm Baptise M.D.   On: 06/02/2019 20:22    Medications:   . albuterol  2 puff Inhalation Q4H  . amLODipine  10 mg Oral Daily  . aspirin EC  81 mg Oral Daily  . atorvastatin  40 mg Oral q1800  . enoxaparin (LOVENOX) injection  40 mg Subcutaneous Q24H  . furosemide  40 mg Intravenous Q12H  . hydrALAZINE  25 mg Oral Q8H  . insulin aspart  0-9 Units Subcutaneous TID WC  . insulin glargine  65 Units Subcutaneous QHS  . labetalol  100 mg Oral BID  . pregabalin  75 mg Oral BID  . sodium chloride flush  3 mL Intravenous Q12H   Continuous Infusions: . sodium chloride       LOS: 0 days   [name] [name]  [facility] Pager [phone].   *Please  refer to [email or web address], password [email or web address] to get updated schedule on who will round on this patient, as hospitalists switch teams weekly. If 7PM-7AM, please contact night-coverage at [email or web address], password [email or web address] for any overnight needs.  06/03/2019, 3:46 PM

## 2019-06-04 ENCOUNTER — Other Ambulatory Visit: Payer: Self-pay

## 2019-06-04 ENCOUNTER — Inpatient Hospital Stay (HOSPITAL_COMMUNITY)
Admit: 2019-06-04 | Discharge: 2019-06-04 | Disposition: A | Discharge status: Home / Self Care | Payer: Medicaid Other | Attending: Physician Assistant | Admitting: Physician Assistant

## 2019-06-04 DIAGNOSIS — R079 Chest pain, unspecified: Secondary | ICD-10-CM

## 2019-06-04 DIAGNOSIS — I5033 Acute on chronic diastolic (congestive) heart failure: Secondary | ICD-10-CM

## 2019-06-04 DIAGNOSIS — I5031 Acute diastolic (congestive) heart failure: Secondary | ICD-10-CM

## 2019-06-04 LAB — CBC WITH DIFFERENTIAL/PLATELET
Abs Immature Granulocytes: 0.01 10*3/uL (ref 0.00–0.07)
Basophils Absolute: 0 10*3/uL (ref 0.0–0.1)
Basophils Relative: 0 %
Eosinophils Absolute: 0 10*3/uL (ref 0.0–0.5)
Eosinophils Relative: 0 %
HCT: 27.4 % — ABNORMAL LOW (ref 36.0–46.0)
Hemoglobin: 8.9 g/dL — ABNORMAL LOW (ref 12.0–15.0)
Immature Granulocytes: 0 %
Lymphocytes Relative: 32 %
Lymphs Abs: 1.5 10*3/uL (ref 0.7–4.0)
MCH: 27.1 pg (ref 26.0–34.0)
MCHC: 32.5 g/dL (ref 30.0–36.0)
MCV: 83.3 fL (ref 80.0–100.0)
Monocytes Absolute: 0.4 10*3/uL (ref 0.1–1.0)
Monocytes Relative: 9 %
Neutro Abs: 2.7 10*3/uL (ref 1.7–7.7)
Neutrophils Relative %: 59 %
Platelets: 177 10*3/uL (ref 150–400)
RBC: 3.29 MIL/uL — ABNORMAL LOW (ref 3.87–5.11)
RDW: 13.6 % (ref 11.5–15.5)
WBC: 4.6 10*3/uL (ref 4.0–10.5)
nRBC: 0 % (ref 0.0–0.2)

## 2019-06-04 LAB — BASIC METABOLIC PANEL
Anion gap: 10 (ref 5–15)
BUN: 26 mg/dL — ABNORMAL HIGH (ref 6–20)
CO2: 20 mmol/L — ABNORMAL LOW (ref 22–32)
Calcium: 8.3 mg/dL — ABNORMAL LOW (ref 8.9–10.3)
Chloride: 112 mmol/L — ABNORMAL HIGH (ref 98–111)
Creatinine, Ser: 1.37 mg/dL — ABNORMAL HIGH (ref 0.44–1.00)
GFR calc Af Amer: 49 mL/min — ABNORMAL LOW (ref >60)
GFR calc non Af Amer: 42 mL/min — ABNORMAL LOW (ref >60)
Glucose, Bld: 197 mg/dL — ABNORMAL HIGH (ref 70–99)
Potassium: 3.6 mmol/L (ref 3.5–5.1)
Sodium: 142 mmol/L (ref 135–145)

## 2019-06-04 LAB — GLUCOSE, CAPILLARY
Glucose-Capillary: 238 mg/dL — ABNORMAL HIGH (ref 70–99)
Glucose-Capillary: 177 mg/dL — ABNORMAL HIGH (ref 70–99)
Glucose-Capillary: 191 mg/dL — ABNORMAL HIGH (ref 70–99)
Glucose-Capillary: 234 mg/dL — ABNORMAL HIGH (ref 70–99)
Glucose-Capillary: 179 mg/dL — ABNORMAL HIGH (ref 70–99)

## 2019-06-04 LAB — ECHOCARDIOGRAM COMPLETE
Height: 64 in
Weight: 3840 oz

## 2019-06-04 LAB — TROPONIN I (HIGH SENSITIVITY): Troponin I (High Sensitivity): 3 ng/L (ref <18)

## 2019-06-04 MED ORDER — METOPROLOL TARTRATE 25 MG PO TABS: 12.5000 mg | ORAL_TABLET | Freq: Two times a day (BID) | ORAL | Status: DC

## 2019-06-04 MED ORDER — HYDRALAZINE HCL 50 MG PO TABS
50.0000 mg | ORAL_TABLET | Freq: Three times a day (TID) | ORAL | Status: DC
  Administered 2019-06-04 – 2019-06-05 (×2): 50 mg via ORAL
  Filled 2019-06-04 (×2): qty 1

## 2019-06-04 MED ORDER — LABETALOL HCL 5 MG/ML IV SOLN
5.0000 mg | Freq: Four times a day (QID) | INTRAVENOUS | Status: DC | PRN
  Administered 2019-06-04: 5 mg via INTRAVENOUS
  Filled 2019-06-04: qty 4

## 2019-06-04 MED ORDER — ALBUTEROL SULFATE (2.5 MG/3ML) 0.083% IN NEBU: 2.5000 mg | INHALATION_SOLUTION | RESPIRATORY_TRACT | Status: DC

## 2019-06-04 MED ORDER — PNEUMOCOCCAL VAC POLYVALENT 25 MCG/0.5ML IJ INJ: 0.5000 mL | INJECTION | INTRAMUSCULAR | Status: DC

## 2019-06-04 MED ORDER — ALBUTEROL SULFATE (2.5 MG/3ML) 0.083% IN NEBU: 2.5000 mg | INHALATION_SOLUTION | Freq: Four times a day (QID) | RESPIRATORY_TRACT | Status: DC | PRN

## 2019-06-04 MED ORDER — CARVEDILOL 3.125 MG PO TABS: 3.1250 mg | ORAL_TABLET | Freq: Two times a day (BID) | ORAL | Status: DC

## 2019-06-04 MED ORDER — POTASSIUM CHLORIDE CRYS ER 20 MEQ PO TBCR
20.0000 meq | EXTENDED_RELEASE_TABLET | Freq: Two times a day (BID) | ORAL | Status: AC
  Administered 2019-06-04 (×2): 20 meq via ORAL
  Filled 2019-06-04 (×2): qty 1

## 2019-06-04 NOTE — Progress Notes (Addendum)
Progress Note  Patient Name: [name] [name] Date of Encounter: 06/04/2019  Primary Cardiologist: Minus Breeding, MD   Subjective   Patient reports earlier CP episode (prior to admission) but no current CP. She reports ongoing SOB.   Inpatient Medications    Scheduled Meds: . amLODipine  10 mg Oral Daily  . aspirin EC  81 mg Oral Daily  . atorvastatin  40 mg Oral q1800  . enoxaparin (LOVENOX) injection  40 mg Subcutaneous Q24H  . furosemide  40 mg Intravenous Q12H  . hydrALAZINE  25 mg Oral Q8H  . insulin aspart  0-9 Units Subcutaneous TID WC  . insulin glargine  65 Units Subcutaneous QHS  . labetalol  100 mg Oral BID  . [START ON 06/05/2019] pneumococcal 23 valent vaccine  0.5 mL Intramuscular Tomorrow-1000  . potassium chloride  20 mEq Oral BID  . pregabalin  75 mg Oral BID  . sodium chloride flush  3 mL Intravenous Q12H   Continuous Infusions: . sodium chloride    . sodium chloride     PRN Meds: sodium chloride, acetaminophen, albuterol, ALPRAZolam, ondansetron (ZOFRAN) IV, sodium chloride flush, zolpidem   Vital Signs    Vitals:   06/04/19 0839 06/04/19 1047 06/04/19 1139 06/04/19 1206  BP: (!) 148/91 (!) 156/93 (!) 145/134 (!) 157/80  Pulse: 79 78 70 75  Resp: 14 15 19    Temp:   98.1 F (36.7 C)   TempSrc:      SpO2: 98% 100%    Weight:   108.5 kg   Height:   5\' 4"  (1.626 m)     Intake/Output Summary (Last 24 hours) at 06/04/2019 1519 Last data filed at 06/04/2019 1407 Gross per 24 hour  Intake 970 ml  Output -  Net 970 ml   Last 3 Weights 06/04/2019 06/02/2019 05/10/2019  Weight (lbs) 239 lb 3.2 oz 240 lb 235 lb 12.8 oz  Weight (kg) 108.5 kg 108.863 kg 106.958 kg      Telemetry    SB-NSR  - Personally Reviewed  ECG    No new tracings- Personally Reviewed  Physical Exam   GEN:  Obese female.  No acute distress.   Neck: JVD difficult to assess d/t body habitus Cardiac: distant heart sounds but RRR, no murmurs, rubs, or gallops.   Respiratory: Distant lung sounds bilaterally GI: firm but nontender MS :non-pitting mild bilateral edema; No deformity. Neuro:  Nonfocal  Psych: Normal affect   Labs    High Sensitivity Troponin:   Recent Labs  Lab 06/02/19 1919 06/03/19 0811 06/04/19 0525  TROPONINIHS 3 3 3       Cardiac EnzymesNo results for input(s): TROPONINI in the last 168 hours. No results for input(s): TROPIPOC in the last 168 hours.   Chemistry Recent Labs  Lab 06/02/19 1919 06/04/19 0525  NA 142 142  K 4.2 3.6  CL 112* 112*  CO2 20* 20*  GLUCOSE 279* 197*  BUN 25* 26*  CREATININE 1.70* 1.37*  CALCIUM 8.6* 8.3*  GFRNONAA 33* 42*  GFRAA 38* 49*  ANIONGAP 10 10     Hematology Recent Labs  Lab 06/02/19 1919 06/03/19 0811 06/04/19 0525  WBC 4.1 4.4 4.6  RBC 3.08* 2.86* 3.29*  HGB 8.3* 7.7* 8.9*  HCT 26.9* 24.7* 27.4*  MCV 87.3 86.4 83.3  MCH 26.9 26.9 27.1  MCHC 30.9 31.2 32.5  RDW 13.7 13.7 13.6  PLT 188 169 177    BNP Recent Labs  Lab 06/02/19 1919  BNP 117.0*  DDimer No results for input(s): DDIMER in the last 168 hours.   Radiology    Dg Chest 2 View  Result Date: 06/02/2019 CLINICAL DATA:  Shortness of breath EXAM: CHEST - 2 VIEW COMPARISON:  04/09/2019 FINDINGS: Heart is borderline in size. Interstitial prominence throughout the lungs. No confluent opacities, effusions or acute bony abnormality. IMPRESSION: Borderline heart size. Interstitial prominence could reflect early interstitial edema. Electronically Signed   By: Rolm Baptise M.D.   On: 06/02/2019 20:22    Cardiac Studies   2D echo 01/2019: 1. The left ventricle has mildly decreased systolic function, with an ejection fraction of 50% with mild diffuse hypokinesis. The cavity size was normal. There is moderate concentric left ventricular hypertrophy. Left ventricular diastolic Doppler  parameters are consistent with impaired relaxation. Elevated left atrial and left ventricular end-diastolic pressures. 2.  The right ventricle has normal systolic function. The cavity was normal. There is no increase in right ventricular wall thickness. 3. Left atrial size was mildly dilated. 4. Mild thickening of the aortic valve. Aortic valve regurgitation is mild by color flow Doppler.  Echo 06/04/2019  1. Left ventricular ejection fraction, by visual estimation, is 55 to 60%. The left ventricle has normal function. There is no left ventricular hypertrophy.  2. Left ventricular diastolic parameters are indeterminate.  3. Global right ventricle has normal systolic function.The right ventricular size is normal. No increase in right ventricular wall thickness.  4. Left atrial size was mildly dilated.  5. Mild mitral annular calcification.  6. The mitral valve was not well visualized. No evidence of mitral valve regurgitation. Mild mitral stenosis.  7. MV peak gradient, 8.6 mmHg.  8. The tricuspid valve is normal in structure. Tricuspid valve regurgitation is not demonstrated.  9. The aortic valve is normal in structure. Aortic valve regurgitation is not visualized. Mild aortic valve sclerosis without stenosis. 10. TR signal is inadequate for assessing pulmonary artery systolic pressure. 11. The inferior vena cava is normal in size with greater than 50% respiratory variability, suggesting right atrial pressure of 3 mmHg.   Patient Profile     58 y.o. female with a history of HFpEF, systolic dysfunction, anemia, hepatitis C, diabetes, CKD stage III, hypertension, morbid obesity, and OSA not on BiPAP with possible OHS who is being seen today for the evaluation of DOE and chest pain.  Assessment & Plan    DOE / CP --As previously noted, this is likely multifactorial in the setting of acute on chronic diastolic CHF, anemia, morbid obesity, likely OHS, OSA (untreated), poorly controlled hypertension, and physical deconditioning.  COVID-19 negative.  --Continue diuresis, recently escalated to 40mg  IV BID.  --Started KCl tab 62mEq BID x2 for now with recheck BMET in AM d/t hypokalemia with escalation of diuresis. --As previously noted, consider further work-up of anemia. --High-sensitivity troponin negative x2. EKG with no acute ST/T changes. Low suspicion for ACS.  --Echo today as above with nl EF. --Could consider Myoview s/p anemia workup, as previously indicated.    Acute on chronic HFpEF   --Continues to note shortness of breath with exam complicated by body habitus and difficult to assess volume status.  Consider also lower extremity edema and third spacing secondary to anemia and poorly controlled hypertension.  Patient also reports dietary noncompliance, frequently eating soups and microwaved meals.  --Continue IV lasix as above with KCl repletion.  --Wt 108kg x2 days. Net positive for the admission. --Echo as above with nl EF.  Acute on chronic anemia of chronic dz --  Hgb drop with patient reporting 1 month of melena. Further evaluation per IM. Also note low Hgb due to anemia of chronic dz.  HTN --Suboptimally controlled.  Continue Norvasc 10mg  for now and PTA labetalol. Not an ideal candidate for amlodipine in the long run due to lower extremity swelling.  Continue Lasix and as needed antihypertensives as tolerated.  Not currently on an ACE or ARB due to angioedema noted with lisinopril. Earlier record of bradycardia with most recent rates in 90s-70s.   Morbid obesity --Weight loss advised.   OSA --CPAP/BiAPAP advised.  CKDII  --Cr 1.17  1.70  1.37, BUN 25  26. --Baseline renal function likely close to 1.17.  For questions or updates, please contact [place] Please consult [email or web address] for contact info under        Signed, Arvil Chaco, PA-C  06/04/2019, 3:19 PM

## 2019-06-04 NOTE — ED Notes (Signed)
CBG 177. 

## 2019-06-04 NOTE — Progress Notes (Signed)
Inpatient Diabetes Program Recommendations  AACE/ADA: New Consensus Statement on Inpatient Glycemic Control (2015)  Target Ranges:  Prepandial:   less than 140 mg/dL      Peak postprandial:   less than 180 mg/dL (1-2 hours)      Critically ill patients:  140 - 180 mg/dL   Lab Results  Component Value Date   GLUCAP 177 (H) 06/04/2019   HGBA1C 13.9 (H) 04/10/2019    Review of Glycemic Control  Diabetes history: DM 2 Outpatient Diabetes medications: Lantus 65 units, Novolog 6 units tid meal coverage Current orders for Inpatient glycemic control:  Lantus 65 units Novolog 0-9 units tid  A1c 13.9% on 9/22 (DM coordinator spoke with patient at that time refer to note)  Inpatient Diabetes Program Recommendations:    Consider Novolog 2 units tid meal coverage if eating >50% of meals.  Thanks,  Tama Headings RN, MSN, BC-ADM Inpatient Diabetes Coordinator Team Pager [phone] (8a-5p)

## 2019-06-04 NOTE — Progress Notes (Signed)
*  PRELIMINARY RESULTS* Echocardiogram 2D Echocardiogram has been performed.  [name] [name] [name] [name] Brosch 06/04/2019, 9:43 AM

## 2019-06-04 NOTE — Progress Notes (Addendum)
Progress Note    [name] [name]  JOI:[id number] DOB: [birth year]  DOA: 06/02/2019 PCP: [name], MD      Brief Narrative:    Medical records reviewed and are as summarized below:  [name] [name] is an 58 y.o. female with history of type II DM, hypertension, chronic systolic and diastolic dysfunction, morbid obesity, CKD stage III, presented to the hospital with worsening shortness of breath,, orthopnea, PND, productive cough and lower extremity edema.  She was admitted to the hospital for acute CHF exacerbation.      Assessment/Plan:   Active Problems:   Acute CHF (congestive heart failure) (HCC)   Body mass index is 41.2 kg/m.    Acute on chronic systolic and diastolic CHF: Improving.  Continue IV Lasix.  Monitor BMP and urine output.  Recent echo in July 2020 showed EF of 50% with mild diffuse hypokinesis, moderate concentric LVH, diastolic dysfunction.  Repeat 2D echo is pending.  Follow-up with cardiologist.  Acute hypoxemic respiratory failure: Improving.  Taper off oxygen via nasal cannula as able.    Acute on chronic anemia: s/p transfusion with 1 unit of packed red blood cells and hemoglobin has improved from 7.7-8.9.  Monitor H&H.  Outpatient evaluation with PCP recommended.  Hypertensive urgency: BP has improved.  Continue antihypertensives.  Peripheral neuropathy: Continue Lyrica  Type 2 diabetes mellitus: Continue Lantus and NovoLog.  CKD stage III: Creatinine is trending down.  Follow-up BMP.  Morbid obesity: Weight loss advised.  Obstructive sleep apnea: Use hospital CPAP machine as able.  Outpatient follow-up for her personal CPAP machine is recommended.    Family Communication/Anticipated D/C date and plan/Code Status   DVT prophylaxis: Lovenox Code Status: Full code Family Communication: Plan discussed with the patient Disposition Plan: Home in 1 to 2 days      Subjective:   She feels a little better today but not quite back  to baseline.  She still has some trouble breathing and overnight her oxygen saturation dropped when she was sleeping.  She said she was diagnosed with obstructive sleep apnea about a month ago but she is yet to receive her CPAP machine.  She has no other complaints.   Objective:    Vitals:   06/04/19 0529 06/04/19 0638 06/04/19 0639 06/04/19 0839  BP: (!) 143/78 (!) 157/85 (!) 157/85 (!) 148/91  Pulse: 88  81 79  Resp: 20  (!) 24 14  Temp:      TempSrc:      SpO2: 97%  97% 98%  Weight:      Height:        Intake/Output Summary (Last 24 hours) at 06/04/2019 1017 Last data filed at 06/04/2019 0218 Gross per 24 hour  Intake 720 ml  Output -  Net 720 ml   Filed Weights   06/02/19 1908  Weight: 108.9 kg    Exam:  GEN: NAD SKIN: No rash EYES: no pallor or icterus ENT: MMM CV: RRR PULM: CTA B ABD: soft, obese , NT, +BS CNS: AAO x 3, non focal EXT: b/l leg edema (1+), no tenderness     Data Reviewed:   I have personally reviewed following labs and imaging studies:  Labs: Labs show the following:   Basic Metabolic Panel: Recent Labs  Lab 06/02/19 1919 06/04/19 0525  NA 142 142  K 4.2 3.6  CL 112* 112*  CO2 20* 20*  GLUCOSE 279* 197*  BUN 25* 26*  CREATININE 1.70* 1.37*  CALCIUM 8.6* 8.3*  GFR Estimated Creatinine Clearance: 54 mL/min (A) (by C-G formula based on SCr of 1.37 mg/dL (H)). Liver Function Tests: No results for input(s): AST, ALT, ALKPHOS, BILITOT, PROT, ALBUMIN in the last 168 hours. No results for input(s): LIPASE, AMYLASE in the last 168 hours. No results for input(s): AMMONIA in the last 168 hours. Coagulation profile No results for input(s): INR, PROTIME in the last 168 hours.  CBC: Recent Labs  Lab 06/02/19 1919 06/03/19 0811 06/04/19 0525  WBC 4.1 4.4 4.6  NEUTROABS  --  2.4 2.7  HGB 8.3* 7.7* 8.9*  HCT 26.9* 24.7* 27.4*  MCV 87.3 86.4 83.3  PLT 188 169 177   Cardiac Enzymes: No results for input(s): CKTOTAL, CKMB,  CKMBINDEX, TROPONINI in the last 168 hours. BNP (last 3 results) No results for input(s): PROBNP in the last 8760 hours. CBG: Recent Labs  Lab 06/03/19 0809 06/03/19 1250 06/03/19 1841 06/03/19 2150 06/04/19 0826  GLUCAP 197* 199* 275* 238* 177*   D-Dimer: No results for input(s): DDIMER in the last 72 hours. Hgb A1c: No results for input(s): HGBA1C in the last 72 hours. Lipid Profile: No results for input(s): CHOL, HDL, LDLCALC, TRIG, CHOLHDL, LDLDIRECT in the last 72 hours. Thyroid function studies: No results for input(s): TSH, T4TOTAL, T3FREE, THYROIDAB in the last 72 hours.  Invalid input(s): FREET3 Anemia work up: No results for input(s): VITAMINB12, FOLATE, FERRITIN, TIBC, IRON, RETICCTPCT in the last 72 hours. Sepsis Labs: Recent Labs  Lab 06/02/19 1919 06/03/19 0811 06/04/19 0525  WBC 4.1 4.4 4.6    Microbiology Recent Results (from the past 240 hour(s))  SARS CORONAVIRUS 2 (TAT 6-24 HRS) Nasopharyngeal Nasopharyngeal Swab     Status: None   Collection Time: 06/02/19 10:38 PM   Specimen: Nasopharyngeal Swab  Result Value Ref Range Status   SARS Coronavirus 2 NEGATIVE NEGATIVE Final    Comment: (NOTE) SARS-CoV-2 target nucleic acids are NOT DETECTED. The SARS-CoV-2 RNA is generally detectable in upper and lower respiratory specimens during the acute phase of infection. Negative results do not preclude SARS-CoV-2 infection, do not rule out co-infections with other pathogens, and should not be used as the sole basis for treatment or other patient management decisions. Negative results must be combined with clinical observations, patient history, and epidemiological information. The expected result is Negative. Fact Sheet for Patients: SugarRoll.be Fact Sheet for Healthcare Providers: https://www.woods-mathews.com/ This test is not yet approved or cleared by the [place] FDA and  has been authorized for  detection and/or diagnosis of SARS-CoV-2 by FDA under an Emergency Use Authorization (EUA). This EUA will remain  in effect (meaning this test can be used) for the duration of the COVID-19 declaration under Section 56 4(b)(1) of the Act, 21 U.S.C. section 360bbb-3(b)(1), unless the authorization is terminated or revoked sooner. Performed at [facility], [place] [address], [place], [place] [address]     Procedures and diagnostic studies:  Dg Chest 2 View  Result Date: 06/02/2019 CLINICAL DATA:  Shortness of breath EXAM: CHEST - 2 VIEW COMPARISON:  04/09/2019 FINDINGS: Heart is borderline in size. Interstitial prominence throughout the lungs. No confluent opacities, effusions or acute bony abnormality. IMPRESSION: Borderline heart size. Interstitial prominence could reflect early interstitial edema. Electronically Signed   By: Rolm Baptise M.D.   On: 06/02/2019 20:22    Medications:   . albuterol  2 puff Inhalation Q4H  . amLODipine  10 mg Oral Daily  . aspirin EC  81 mg Oral Daily  . atorvastatin  40 mg Oral q1800  . enoxaparin (LOVENOX) injection  40 mg Subcutaneous Q24H  . furosemide  40 mg Intravenous Q12H  . hydrALAZINE  25 mg Oral Q8H  . insulin aspart  0-9 Units Subcutaneous TID WC  . insulin glargine  65 Units Subcutaneous QHS  . labetalol  100 mg Oral BID  . pregabalin  75 mg Oral BID  . sodium chloride flush  3 mL Intravenous Q12H   Continuous Infusions: . sodium chloride    . sodium chloride       LOS: 1 day   Kavontae Pritchard  [facility] Pager [phone].   *Please refer to [email or web address], password [email or web address] to get updated schedule on who will round on this patient, as hospitalists switch teams weekly. If 7PM-7AM, please contact night-coverage at [email or web address], password [email or web address] for any overnight needs.  06/04/2019, 10:17 AM

## 2019-06-04 NOTE — ED Notes (Signed)
Pt oxygen saturation dropping to 87% RA while sleeping.  When roused sats back up to 92%.  Pt placed on 2L [place] while sleeping.

## 2019-06-04 NOTE — Progress Notes (Signed)
Patients BP 170/90 she had no PRNs. Messaged Dr. Mal Misty and he put in new PRN orders and adjusted her Hydralazine.

## 2019-06-04 NOTE — ED Notes (Signed)
Pt assisted to bathroom. Pt repositioned. Lights turned off to promote comfort.

## 2019-06-05 LAB — CBC WITH DIFFERENTIAL/PLATELET
Abs Immature Granulocytes: 0.01 10*3/uL (ref 0.00–0.07)
Basophils Absolute: 0 10*3/uL (ref 0.0–0.1)
Basophils Relative: 0 %
Eosinophils Absolute: 0 10*3/uL (ref 0.0–0.5)
Eosinophils Relative: 0 %
HCT: 28.3 % — ABNORMAL LOW (ref 36.0–46.0)
Hemoglobin: 9.1 g/dL — ABNORMAL LOW (ref 12.0–15.0)
Immature Granulocytes: 0 %
Lymphocytes Relative: 37 %
Lymphs Abs: 1.7 10*3/uL (ref 0.7–4.0)
MCH: 26.7 pg (ref 26.0–34.0)
MCHC: 32.2 g/dL (ref 30.0–36.0)
MCV: 83 fL (ref 80.0–100.0)
Monocytes Absolute: 0.4 10*3/uL (ref 0.1–1.0)
Monocytes Relative: 8 %
Neutro Abs: 2.5 10*3/uL (ref 1.7–7.7)
Neutrophils Relative %: 55 %
Platelets: 176 10*3/uL (ref 150–400)
RBC: 3.41 MIL/uL — ABNORMAL LOW (ref 3.87–5.11)
RDW: 13.7 % (ref 11.5–15.5)
WBC: 4.6 10*3/uL (ref 4.0–10.5)
nRBC: 0 % (ref 0.0–0.2)

## 2019-06-05 LAB — BASIC METABOLIC PANEL
Anion gap: 7 (ref 5–15)
BUN: 26 mg/dL — ABNORMAL HIGH (ref 6–20)
CO2: 22 mmol/L (ref 22–32)
Calcium: 8.1 mg/dL — ABNORMAL LOW (ref 8.9–10.3)
Chloride: 110 mmol/L (ref 98–111)
Creatinine, Ser: 1.4 mg/dL — ABNORMAL HIGH (ref 0.44–1.00)
GFR calc Af Amer: 48 mL/min — ABNORMAL LOW (ref >60)
GFR calc non Af Amer: 41 mL/min — ABNORMAL LOW (ref >60)
Glucose, Bld: 155 mg/dL — ABNORMAL HIGH (ref 70–99)
Potassium: 3.9 mmol/L (ref 3.5–5.1)
Sodium: 139 mmol/L (ref 135–145)

## 2019-06-05 LAB — GLUCOSE, CAPILLARY
Glucose-Capillary: 151 mg/dL — ABNORMAL HIGH (ref 70–99)
Glucose-Capillary: 211 mg/dL — ABNORMAL HIGH (ref 70–99)

## 2019-06-05 MED ORDER — HYDRALAZINE HCL 50 MG PO TABS: 50.0000 mg | ORAL_TABLET | Freq: Three times a day (TID) | ORAL | 0 refills | Status: DC

## 2019-06-05 MED ORDER — FUROSEMIDE 40 MG PO TABS: 40.0000 mg | ORAL_TABLET | Freq: Two times a day (BID) | ORAL | 0 refills | Status: DC

## 2019-06-05 NOTE — Plan of Care (Signed)
Discharge instructions provided to pt.  All questions addressed.  Understanding verified through teach back.  Awaiting transportation home via POV.   Problem: Education: Goal: Knowledge of General Education information will improve Description: Including pain rating scale, medication(s)/side effects and non-pharmacologic comfort measures Outcome: Completed/Met   Problem: Health Behavior/Discharge Planning: Goal: Ability to manage health-related needs will improve Outcome: Completed/Met   Problem: Clinical Measurements: Goal: Ability to maintain clinical measurements within normal limits will improve Outcome: Completed/Met Goal: Will remain free from infection Outcome: Completed/Met Goal: Diagnostic test results will improve Outcome: Completed/Met Goal: Respiratory complications will improve Outcome: Completed/Met Goal: Cardiovascular complication will be avoided Outcome: Completed/Met   Problem: Activity: Goal: Risk for activity intolerance will decrease Outcome: Completed/Met   Problem: Nutrition: Goal: Adequate nutrition will be maintained Outcome: Completed/Met   Problem: Coping: Goal: Level of anxiety will decrease Outcome: Completed/Met   Problem: Elimination: Goal: Will not experience complications related to bowel motility Outcome: Completed/Met Goal: Will not experience complications related to urinary retention Outcome: Completed/Met   Problem: Pain Managment: Goal: General experience of comfort will improve Outcome: Completed/Met   Problem: Safety: Goal: Ability to remain free from injury will improve Outcome: Completed/Met   Problem: Skin Integrity: Goal: Risk for impaired skin integrity will decrease Outcome: Completed/Met   Problem: Education: Goal: Ability to demonstrate management of disease process will improve Outcome: Completed/Met Goal: Ability to verbalize understanding of medication therapies will improve Outcome: Completed/Met Goal:  Individualized Educational Video(s) Outcome: Completed/Met   Problem: Activity: Goal: Capacity to carry out activities will improve Outcome: Completed/Met   Problem: Cardiac: Goal: Ability to achieve and maintain adequate cardiopulmonary perfusion will improve Outcome: Completed/Met

## 2019-06-05 NOTE — Discharge Summary (Signed)
Physician Discharge Summary  [name] [name] SWF:[id number] DOB: 09-21-1960 DOA: 06/02/2019  PCP: [name], MD  Admit date: 06/02/2019 Discharge date: 06/05/2019  Discharge disposition: Home   Recommendations for Outpatient Follow-Up:   Follow-up with PCP within 1 week of discharge.  Recommended evaluation of anemia as an outpatient.  Follow-up with cardiologist and also follow-up at heart failure clinic.   Discharge Diagnosis:   Active Problems:   Acute CHF (congestive heart failure) (HCC)   Acute on chronic diastolic heart failure ([place])    Discharge Condition: Stable.  Diet recommendation: Low-salt, low-sodium and low-fat diet  Code status: Full code    Hospital Course:    [name] [name] is an 58 y.o. female with history of type II DM, hypertension, chronic systolic and diastolic dysfunction, morbid obesity, CKD stage III, recently diagnosed with obstructive sleep apnea (awaiting CPAP machine), presented to the hospital with worsening shortness of breath,, orthopnea, PND, productive cough and lower extremity edema.  She was admitted to the hospital for acute CHF exacerbation and acute hypoxemic respiratory failure.  She was treated with IV Lasix and oxygen via nasal cannula.  She was also transfused with 1 unit of packed red blood cells because of worsening anemia.  Hemoglobin has improved and hypoxemia has resolved.  She has been able to ambulate without any problems.  She feels much better and she is deemed stable for discharge to home today.  She has been advised to obtain her CPAP machine for management of her obstructive sleep apnea which would help with heart failure and hypertension as well.  Further work-up of anemia was strongly recommended. Discharge plan was discussed with the patient and her daughter, [name], over the phone.  All her questions were answered.   Medical Consultants:   Cardiologist from [facility]  Discharge Exam:   Vitals:   06/05/19 0358 06/05/19 0759  BP: (!) 162/80 (!) 157/86  Pulse: 81 79  Resp:  19  Temp: 98.4 F (36.9 C) 97.6 F (36.4 C)  SpO2: 96% 93%   Vitals:   06/04/19 1844 06/04/19 2007 06/05/19 0358 06/05/19 0759  BP: (!) 179/87 (!) 157/82 (!) 162/80 (!) 157/86  Pulse: 82 81 81 79  Resp:    19  Temp:  98.3 F (36.8 C) 98.4 F (36.9 C) 97.6 F (36.4 C)  TempSrc:  Oral Oral Oral  SpO2:  96% 96% 93%  Weight:   109 kg   Height:         GEN: NAD SKIN: No rash EYES: EOMI ENT: MMM CV: RRR PULM: CTA B ABD: soft, obese, NT, +BS CNS: AAO x 3, non focal EXT: No edema or tenderness   The results of significant diagnostics from this hospitalization (including imaging, microbiology, ancillary and laboratory) are listed below for reference.     Procedures and Diagnostic Studies:   No results found.   Labs:   Basic Metabolic Panel: Recent Labs  Lab 06/02/19 1919 06/04/19 0525 06/05/19 0442  NA 142 142 139  K 4.2 3.6 3.9  CL 112* 112* 110  CO2 20* 20* 22  GLUCOSE 279* 197* 155*  BUN 25* 26* 26*  CREATININE 1.70* 1.37* 1.40*  CALCIUM 8.6* 8.3* 8.1*   GFR Estimated Creatinine Clearance: 52.8 mL/min (A) (by C-G formula based on SCr of 1.4 mg/dL (H)). Liver Function Tests: No results for input(s): AST, ALT, ALKPHOS, BILITOT, PROT, ALBUMIN in the last 168 hours. No results for input(s): LIPASE, AMYLASE in the last 168 hours. No results for  input(s): AMMONIA in the last 168 hours. Coagulation profile No results for input(s): INR, PROTIME in the last 168 hours.  CBC: Recent Labs  Lab 06/02/19 1919 06/03/19 0811 06/04/19 0525 06/05/19 0442  WBC 4.1 4.4 4.6 4.6  NEUTROABS  --  2.4 2.7 2.5  HGB 8.3* 7.7* 8.9* 9.1*  HCT 26.9* 24.7* 27.4* 28.3*  MCV 87.3 86.4 83.3 83.0  PLT 188 169 177 176   Cardiac Enzymes: No results for input(s): CKTOTAL, CKMB, CKMBINDEX, TROPONINI in the last 168 hours. BNP: Invalid input(s): POCBNP CBG: Recent Labs  Lab 06/04/19 0826 06/04/19  1144 06/04/19 1639 06/04/19 2146 06/05/19 0725  GLUCAP 177* 191* 234* 179* 151*   D-Dimer No results for input(s): DDIMER in the last 72 hours. Hgb A1c No results for input(s): HGBA1C in the last 72 hours. Lipid Profile No results for input(s): CHOL, HDL, LDLCALC, TRIG, CHOLHDL, LDLDIRECT in the last 72 hours. Thyroid function studies No results for input(s): TSH, T4TOTAL, T3FREE, THYROIDAB in the last 72 hours.  Invalid input(s): FREET3 Anemia work up No results for input(s): VITAMINB12, FOLATE, FERRITIN, TIBC, IRON, RETICCTPCT in the last 72 hours. Microbiology Recent Results (from the past 240 hour(s))  SARS CORONAVIRUS 2 (TAT 6-24 HRS) Nasopharyngeal Nasopharyngeal Swab     Status: None   Collection Time: 06/02/19 10:38 PM   Specimen: Nasopharyngeal Swab  Result Value Ref Range Status   SARS Coronavirus 2 NEGATIVE NEGATIVE Final    Comment: (NOTE) SARS-CoV-2 target nucleic acids are NOT DETECTED. The SARS-CoV-2 RNA is generally detectable in upper and lower respiratory specimens during the acute phase of infection. Negative results do not preclude SARS-CoV-2 infection, do not rule out co-infections with other pathogens, and should not be used as the sole basis for treatment or other patient management decisions. Negative results must be combined with clinical observations, patient history, and epidemiological information. The expected result is Negative. Fact Sheet for Patients: SugarRoll.be Fact Sheet for Healthcare Providers: https://www.woods-mathews.com/ This test is not yet approved or cleared by the [place] FDA and  has been authorized for detection and/or diagnosis of SARS-CoV-2 by FDA under an Emergency Use Authorization (EUA). This EUA will remain  in effect (meaning this test can be used) for the duration of the COVID-19 declaration under Section 56 4(b)(1) of the Act, 21 U.S.C. section 360bbb-3(b)(1), unless  the authorization is terminated or revoked sooner. Performed at [facility], [place] [address] [place], [place] [address]      Discharge Instructions:   Discharge Instructions    (HEART FAILURE PATIENTS) Call MD:  Anytime you have any of the following symptoms: 1) 3 pound weight gain in 24 hours or 5 pounds in 1 week 2) shortness of breath, with or without a dry hacking cough 3) swelling in the hands, feet or stomach 4) if you have to sleep on extra pillows at night in order to breathe.   Complete by: As directed    AMB referral to CHF clinic   Complete by: As directed    Diet - low sodium heart healthy   Complete by: As directed    Diet Carb Modified   Complete by: As directed    Discharge instructions   Complete by: As directed    Follow-up with PCP within 1 week of discharge   Heart Failure patients record your daily weight using the same scale at the same time of day   Complete by: As directed    Increase activity slowly   Complete  by: As directed    STOP any activity that causes chest pain, shortness of breath, dizziness, sweating, or exessive weakness   Complete by: As directed      Allergies as of 06/05/2019      Reactions   Lisinopril Swelling      Medication List    TAKE these medications   albuterol 108 (90 Base) MCG/ACT inhaler Commonly known as: VENTOLIN HFA Inhale 2 puffs into the lungs every 6 (six) hours as needed for wheezing or shortness of breath.   amLODipine 10 MG tablet Commonly known as: NORVASC Take 1 tablet (10 mg total) by mouth daily.   atorvastatin 40 MG tablet Commonly known as: LIPITOR Take 1 tablet (40 mg total) by mouth daily.   blood glucose meter kit and supplies Kit Dispense based on patient and insurance preference. Use up to four times daily as directed. (FOR ICD-9 250.00, 250.01).   furosemide 40 MG tablet Commonly known as: Lasix Take 1 tablet (40 mg total) by mouth 2 (two) times daily.   hydrALAZINE 50 MG tablet  Commonly known as: APRESOLINE Take 1 tablet (50 mg total) by mouth 3 (three) times daily.   insulin aspart 100 UNIT/ML injection Commonly known as: novoLOG Inject 6 Units into the skin 3 (three) times daily before meals.   labetalol 100 MG tablet Commonly known as: NORMODYNE Take 1 tablet (100 mg total) by mouth 2 (two) times daily.   Lantus SoloStar 100 UNIT/ML Solostar Pen Generic drug: Insulin Glargine Inject 65 Units into the skin at bedtime.   Lyrica 75 MG capsule Generic drug: pregabalin Take 1 capsule (75 mg total) by mouth 2 (two) times daily.      Follow-up Information    [facility] Follow up on 06/21/2019.   Specialty: Cardiology Why: at 2:00pm. Please enter through the [place] entrance Contact information: [place] [phone]           Time coordinating discharge: 27 minutes  Signed:  Jennye Boroughs  Pager [phone] [facility] 06/05/2019, 10:51 AM

## 2019-06-05 NOTE — TOC Initial Note (Signed)
Transition of Care [facility]) - Initial/Assessment Note    Patient Details  Name: [name] [name] MRN: [id number] Date of Birth: 11/14/1960  Transition of Care [facility]) CM/SW Contact:    Candie Chroman, LCSW Phone Number: 06/05/2019, 10:14 AM  Clinical Narrative: Readmission prevention screen complete. CSW met with patient. No supports at bedside. CSW introduced role and explained that discharge planning would be discussed. Her PCP is Dr. Margarita Rana and she follows up regularly. [facility] in [place]. She sometimes has issues getting medications in that the pharmacy tells her she has to call her MD to get refills for some things. She lives with her boyfriend, godson, and nephew. Her boyfriend drives her as needed. No home health or DME prior to admission. Patient does not have a scale and says she cannot afford one. Will try to provide one at discharge. She has been educated on weighing daily. No further concerns. CSW encouraged patient to contact CSW as needed. CSW will continue to follow patient for support and facilitate return home when stable.                 Expected Discharge Plan: Home/Self Care Barriers to Discharge: Continued Medical Work up   Patient Goals and CMS Choice     Choice offered to / list presented to : NA  Expected Discharge Plan and Services Expected Discharge Plan: Home/Self Care     Post Acute Care Choice: NA Living arrangements for the past 2 months: Single Family Home                                      Prior Living Arrangements/Services Living arrangements for the past 2 months: Single Family Home Lives with:: Significant Other, Relatives(Godson and nephew.) Patient language and need for interpreter reviewed:: Yes Do you feel safe going back to the place where you live?: Yes      Need for Family Participation in Patient Care: Yes (Comment) Care giver support system in place?: Yes (comment)   Criminal  Activity/Legal Involvement Pertinent to Current Situation/Hospitalization: No - Comment as needed  Activities of Daily Living Home Assistive Devices/Equipment: None ADL Screening (condition at time of admission) Patient's cognitive ability adequate to safely complete daily activities?: Yes Does the patient have difficulty seeing, even when wearing glasses/contacts?: No Patient able to express need for assistance with ADLs?: No Does the patient have difficulty dressing or bathing?: No Independently performs ADLs?: Yes (appropriate for developmental age) Does the patient have difficulty walking or climbing stairs?: Yes Weakness of Legs: Both Weakness of Arms/Hands: None  Permission Sought/Granted                  Emotional Assessment Appearance:: Appears stated age Attitude/Demeanor/Rapport: Engaged, Gracious Affect (typically observed): Accepting, Appropriate, Calm, Pleasant Orientation: : Oriented to Self, Oriented to Place, Oriented to  Time, Oriented to Situation Alcohol / Substance Use: Illicit Drugs(Listed in history: 2008) Psych Involvement: No (comment)  Admission diagnosis:  Hypoxia [R09.02] Symptomatic anemia [D64.9] Dyspnea, unspecified type [R06.00] Congestive heart failure, unspecified HF chronicity, unspecified heart failure type (HCC) [I50.9] Acute CHF (congestive heart failure) ([place]) [I50.9] Patient Active Problem List   Diagnosis Date Noted  . Acute on chronic diastolic heart failure ([place])   . Acute CHF (congestive heart failure) ([place]) 06/03/2019  . Hypoxia   . SOB (shortness of breath) 05/09/2019  . Sleep apnea 05/09/2019  .  Hyperglycemia 04/09/2019  . Snoring 03/13/2019  . Symptomatic anemia 08/17/2018  . Acute renal failure (ARF) ([place]) 08/17/2018  . Chronic cystitis 08/03/2018  . Diabetic neuropathy ([place]) 06/16/2017  . Non compliance w medication regimen 04/12/2017  . Family history of colon cancer in mother 11/17/2016  . Dyspareunia in female  11/11/2016  . Screen for colon cancer 11/11/2016  . History of ovarian cyst 11/11/2016  . Chronic hepatitis C without hepatic coma ([place]) 11/09/2016  . Hyperlipidemia 10/07/2016  . Type 2 diabetes mellitus ([place]) 01/29/2014  . BREAST PAIN, BILATERAL 02/19/2010  . ANEMIA, IRON DEFICIENCY 10/03/2009  . LEUKOPENIA, MILD 09/19/2009  . CHEST PAIN UNSPECIFIED 09/19/2009  . LIPOMA 06/18/2009  . 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 TINEA PEDIS 05/07/2009  . SKIN LESION 05/07/2009  . COUGH 05/07/2009  . SUBSTANCE ABUSE, MULTIPLE 02/22/2007  . Essential hypertension 02/22/2007   PCP:  [name], MD Pharmacy:   [facility] ([place]), [place] - [address] [place] ([place]) [place] [address] Phone: [phone] Fax: [phone]     Social Determinants of Health (SDOH) Interventions    Readmission Risk Interventions Readmission Risk Prevention Plan 06/05/2019 04/12/2019  Transportation Screening Complete Complete  PCP or Specialist Appt within 3-5 Days - (No Data)  Palliative Care Screening - Not Applicable  Medication Review (RN Care Manager) Complete Complete  PCP or Specialist appointment within 3-5 days of discharge Complete -  [place] or Home Care Consult Complete -  SW Recovery Care/Counseling Consult Complete -  Palliative Care Screening Not Applicable -  [place] Not Applicable -  Some recent data might be hidden

## 2019-06-05 NOTE — Discharge Instructions (Signed)
Heart Failure, Diagnosis ° °Heart failure means that your heart is not able to pump blood in the right way. This makes it hard for your body to work well. Heart failure is usually a long-term (chronic) condition. You must take good care of yourself and follow your treatment plan from your doctor. °What are the causes? °This condition may be caused by: °· High blood pressure. °· Build up of cholesterol and fat in the arteries. °· Heart attack. This injures the heart muscle. °· Heart valves that do not open and close properly. °· Damage of the heart muscle. This is also called cardiomyopathy. °· Lung disease. °· Abnormal heart rhythms. °What increases the risk? °The risk of heart failure goes up as a person ages. This condition is also more likely to develop in people who: °· Are overweight. °· Are female. °· Smoke or chew tobacco. °· Abuse alcohol or illegal drugs. °· Have taken medicines that can damage the heart. °· Have diabetes. °· Have abnormal heart rhythms. °· Have thyroid problems. °· Have low blood counts (anemia). °What are the signs or symptoms? °Symptoms of this condition include: °· Shortness of breath. °· Coughing. °· Swelling of the feet, ankles, legs, or belly. °· Losing weight for no reason. °· Trouble breathing. °· Waking from sleep because of the need to sit up and get more air. °· Rapid heartbeat. °· Being very tired. °· Feeling dizzy, or feeling like you may pass out (faint). °· Having no desire to eat. °· Feeling like you may vomit (nauseous). °· Peeing (urinating) more at night. °· Feeling confused. °How is this treated? ° °  ° °This condition may be treated with: °· Medicines. These can be given to treat blood pressure and to make the heart muscles stronger. °· Changes in your daily life. These may include eating a healthy diet, staying at a healthy body weight, quitting tobacco and illegal drug use, or doing exercises. °· Surgery. Surgery can be done to open blocked valves, or to put devices in  the heart, such as pacemakers. °· A donor heart (heart transplant). You will receive a healthy heart from a donor. °Follow these instructions at home: °· Treat other conditions as told by your doctor. These may include high blood pressure, diabetes, thyroid disease, or abnormal heart rhythms. °· Learn as much as you can about heart failure. °· Get support as you need it. °· Keep all follow-up visits as told by your doctor. This is important. °Summary °· Heart failure means that your heart is not able to pump blood in the right way. °· This condition is caused by high blood pressure, heart attack, or damage of the heart muscle. °· Symptoms of this condition include shortness of breath and swelling of the feet, ankles, legs, or belly. You may also feel very tired or feel like you may vomit. °· You may be treated with medicines, surgery, or changes in your daily life. °· Treat other health conditions as told by your doctor. °This information is not intended to replace advice given to you by your health care provider. Make sure you discuss any questions you have with your health care provider. °Document Released: 04/13/2008 Document Revised: 09/22/2018 Document Reviewed: 09/22/2018 °Elsevier Patient Education © 2020 Else[employer] Inc. ° °

## 2019-06-05 NOTE — TOC Transition Note (Signed)
Transition of Care [facility]) - CM/SW Discharge Note   Patient Details  Name: [name] [name] MRN: [id number] Date of Birth: [birth year]  Transition of Care [facility]) CM/SW Contact:  Candie Chroman, LCSW Phone Number: 06/05/2019, 11:32 AM   Clinical Narrative: Patient has orders to discharge home today. Provided patient with a scale. No further concerns. CSW signing off.    Final next level of care: Home/Self Care Barriers to Discharge: Barriers Resolved   Patient Goals and CMS Choice     Choice offered to / list presented to : NA  Discharge Placement                    Patient and family notified of of transfer: 06/05/19  Discharge Plan and Services     Post Acute Care Choice: NA                               Social Determinants of Health (SDOH) Interventions     Readmission Risk Interventions Readmission Risk Prevention Plan 06/05/2019 04/12/2019  Transportation Screening Complete Complete  PCP or Specialist Appt within 3-5 Days - (No Data)  Palliative Care Screening - Not Applicable  Medication Review (RN Care Manager) Complete Complete  PCP or Specialist appointment within 3-5 days of discharge Complete -  [place] or Home Care Consult Complete -  SW Recovery Care/Counseling Consult Complete -  Palliative Care Screening Not Applicable -  [place] Not Applicable -  Some recent data might be hidden

## 2019-06-06 LAB — BPAM RBC
Blood Product Expiration Date: 202012082359
Blood Product Expiration Date: 202012112359
Blood Product Expiration Date: 202012172359
ISSUE DATE / TIME: 202011152251
Unit Type and Rh: 600
Unit Type and Rh: 600
Unit Type and Rh: 600

## 2019-06-06 LAB — TYPE AND SCREEN
ABO/RH(D): A NEG
Antibody Screen: POSITIVE
Donor AG Type: NEGATIVE
Unit division: 0
Unit division: 0
Unit division: 0

## 2019-06-13 ENCOUNTER — Telehealth: Payer: Self-pay | Admitting: Family Medicine

## 2019-06-13 NOTE — Telephone Encounter (Signed)
Monica from [facility] [place] is now working with the patient

## 2019-06-18 ENCOUNTER — Telehealth: Payer: Self-pay | Admitting: Family Medicine

## 2019-06-18 DIAGNOSIS — E1149 Type 2 diabetes mellitus with other diabetic neurological complication: Secondary | ICD-10-CM

## 2019-06-18 NOTE — Telephone Encounter (Signed)
1) Medication(s) Requested (by name): LYRICA 75 MG capsule [[id number]]   2) Pharmacy of Choice:  [facility] ([place]), [place] - [address]   Patient is completely out of medication.   Approved medications will be sent to pharmacy, we will reach out to you if there is an issue.  Requests made after 3pm may not be addressed until following business day!

## 2019-06-18 NOTE — Telephone Encounter (Signed)
Patient last seen on 04/23/19. Will route to PCP to refill if appropriate.

## 2019-06-19 MED ORDER — LYRICA 75 MG PO CAPS: 75.0000 mg | ORAL_CAPSULE | Freq: Two times a day (BID) | ORAL | 6 refills | Status: DC

## 2019-06-19 NOTE — Addendum Note (Signed)
Addended by: [name] on: 06/19/2019 08:29 AM   Modules accepted: Orders

## 2019-06-19 NOTE — Telephone Encounter (Signed)
Done

## 2019-06-21 ENCOUNTER — Ambulatory Visit: Payer: Medicaid Other | Admitting: Family

## 2019-06-26 ENCOUNTER — Telehealth: Payer: Self-pay | Admitting: Family Medicine

## 2019-06-26 DIAGNOSIS — Z794 Long term (current) use of insulin: Secondary | ICD-10-CM

## 2019-06-26 DIAGNOSIS — E1149 Type 2 diabetes mellitus with other diabetic neurological complication: Secondary | ICD-10-CM

## 2019-06-26 MED ORDER — LYRICA 75 MG PO CAPS: 75.0000 mg | ORAL_CAPSULE | Freq: Two times a day (BID) | ORAL | 6 refills | Status: DC

## 2019-06-26 NOTE — Telephone Encounter (Signed)
[facility] [address] has confirmed that they have not received the lyrica prescription sent over on 06/19/2019

## 2019-06-26 NOTE — Telephone Encounter (Signed)
1) Medication(s) Requested (by name): Lyrica   2) Pharmacy of Choice: [facility] on [address]   3) Special Requests: Pharmacy states they did not receive the Rx    Approved medications will be sent to the pharmacy, we will reach out if there is an issue.  Requests made after 3pm may not be addressed until the following business day!  If a patient is unsure of the name of the medication(s) please note and ask patient to call back when they are able to provide all info, do not send to responsible party until all information is available!

## 2019-06-26 NOTE — Telephone Encounter (Signed)
Done

## 2019-06-28 ENCOUNTER — Telehealth: Payer: Self-pay

## 2019-06-28 DIAGNOSIS — E1149 Type 2 diabetes mellitus with other diabetic neurological complication: Secondary | ICD-10-CM

## 2019-06-28 DIAGNOSIS — Z794 Long term (current) use of insulin: Secondary | ICD-10-CM

## 2019-06-28 NOTE — Progress Notes (Signed)
Patient ID: [name] [name], female    DOB: 1961-03-24, 58 y.o.   MRN: [id number]  HPI  [name] [name] is a 58 y/o female with a history of DM, HTN, anemia, chronic hepatitis C and chronic heart failure.   Echo report from 06/04/2019 reviewed and showed an EF of 55-60% along with mild [name].   Admitted 06/02/2019 due to acute HF exacerbation. Cardiology consult obtained. Initially given IV lasix and then transitioned to oral diuretics. Given 1 unit of PRBC's due to anemia. Discharged after 3 days. Was in the ED 04/20/2019 due to facial swelling most likely related to lisinopril use. She was treated and released.   She presents today for her initial visit with a chief complaint of minimal shortness of breath upon moderate exertion. She describes this as having been present for several months but is much better since her recent hospital stay. She has associated fatigue and neuropathy in her lower legs. She denies any difficulty sleeping, dizziness, abdominal distention, palpitations, pedal edema, chest pain, cough or weight gain.   Says that she has scales at home but hasn't been weighing herself daily. Is waiting on bipap equipment. She says that she forgot to take her insulin this morning.  Past Medical History:  Diagnosis Date  . Anemia   . CHF (congestive heart failure) ([place])   . Chronic hepatitis C without hepatic coma ([place]) 11/09/2016  . Diabetes mellitus   . Fibroids   . 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 Hypertension   . MRSA (methicillin resistant Staphylococcus aureus)   . Trichomonas   . VAGINITIS, BACTERIAL, RECURRENT 08/15/2007   Qualifier: Diagnosis of  By: Radene Ou MD, [place]     Past Surgical History:  Procedure Laterality Date  . BREAST BIOPSY Left 2018  . CESAREAN SECTION     breech   Family History  Problem Relation Age of Onset  . Colon cancer Mother   . Liver disease Sister   . Other Neg Hx   . Breast cancer Neg Hx   . Esophageal  cancer Neg Hx   . Rectal cancer Neg Hx    Social History   Tobacco Use  . Smoking status: Never Smoker  . Smokeless tobacco: Never Used  Substance Use Topics  . Alcohol use: No   Allergies  Allergen Reactions  . Lisinopril Swelling   Prior to Admission medications   Medication Sig Start Date End Date Taking? Authorizing Provider  albuterol (VENTOLIN HFA) 108 (90 Base) MCG/ACT inhaler Inhale 2 puffs into the lungs every 6 (six) hours as needed for wheezing or shortness of breath. 04/23/19  Yes [name], MD  amLODipine (NORVASC) 10 MG tablet Take 1 tablet (10 mg total) by mouth daily. 01/29/19  Yes [name], MD  atorvastatin (LIPITOR) 40 MG tablet Take 1 tablet (40 mg total) by mouth daily. 01/29/19  Yes [name], MD  blood glucose meter kit and supplies KIT Dispense based on patient and insurance preference. Use up to four times daily as directed. (FOR ICD-9 250.00, 250.01). 04/12/19  Yes Dustin Flock, MD  furosemide (LASIX) 40 MG tablet Take 1 tablet (40 mg total) by mouth 2 (two) times daily. 06/05/19 06/04/20 Yes Jennye Boroughs, MD  hydrALAZINE (APRESOLINE) 50 MG tablet Take 1 tablet (50 mg total) by mouth 3 (three) times daily. 06/05/19 06/04/20 Yes Jennye Boroughs, MD  insulin aspart (NOVOLOG) 100 UNIT/ML injection Inject 6 Units into the skin 3 (three) times  daily before meals.   Yes [provider]  Insulin Glargine (LANTUS SOLOSTAR) 100 UNIT/ML Solostar Pen Inject 65 Units into the skin at bedtime. 01/29/19  Yes [name], MD  labetalol (NORMODYNE) 100 MG tablet Take 1 tablet (100 mg total) by mouth 2 (two) times daily. 05/10/19  Yes Minus Breeding, MD  LYRICA 75 MG capsule Take 1 capsule (75 mg total) by mouth 2 (two) times daily. 06/29/19  Yes [name], MD     Review of Systems  Constitutional: Positive for fatigue. Negative for appetite change.  HENT: Negative for congestion, postnasal drip and sore throat.   Eyes: Negative.    Respiratory: Positive for shortness of breath (with moderate exertion). Negative for chest tightness.   Cardiovascular: Negative for chest pain, palpitations and leg swelling.  Gastrointestinal: Negative for abdominal distention and abdominal pain.  Endocrine: Negative.   Genitourinary: Negative.   Musculoskeletal: Negative for back pain and neck pain.  Skin: Negative.   Allergic/Immunologic: Negative.   Neurological: Positive for numbness (in legs due to diabetes). Negative for dizziness and light-headedness.  Hematological: Negative for adenopathy. Does not bruise/bleed easily.  Psychiatric/Behavioral: Negative for dysphoric mood and sleep disturbance (sleeping on 2 pillows). The patient is not nervous/anxious.     Vitals:   06/29/19 1127  BP: (!) 142/93  Pulse: 78  Resp: 20  SpO2: 100%  Weight: 231 lb 6.4 oz (105 kg)  Height: 5' 6" (1.676 m)   Wt Readings from Last 3 Encounters:  06/29/19 231 lb 6.4 oz (105 kg)  06/05/19 240 lb 4.8 oz (109 kg)  05/10/19 235 lb 12.8 oz (107 kg)   Lab Results  Component Value Date   CREATININE 1.40 (H) 06/05/2019   CREATININE 1.37 (H) 06/04/2019   CREATININE 1.70 (H) 06/02/2019    Physical Exam Vitals and nursing note reviewed.  Constitutional:      Appearance: Normal appearance.  HENT:     Head: Normocephalic and atraumatic.  Cardiovascular:     Rate and Rhythm: Normal rate and regular rhythm.  Pulmonary:     Effort: Pulmonary effort is normal. No respiratory distress.     Breath sounds: No wheezing or rales.  Abdominal:     General: There is no distension.     Palpations: Abdomen is soft.     Tenderness: There is no abdominal tenderness.  Musculoskeletal:        General: No tenderness.     Cervical back: Normal range of motion and neck supple.     Right lower leg: No edema.     Left lower leg: No edema.  Skin:    General: Skin is warm and dry.  Neurological:     General: No focal deficit present.     Mental Status: She  is alert and oriented to person, place, and time.  Psychiatric:        Mood and Affect: Mood normal.        Behavior: Behavior normal.        Thought Content: Thought content normal.     Assessment & Plan:  1: Chronic heart failure with preserved ejection fraction- - NYHA class II - euvolemic today - not weighing daily but does have scales at home; instructed to weigh every morning after using the bathroom, write the weight down and call for an overnight weight gain of >2 pounds or a weekly weight gain of >5 pounds - not adding salt and is trying to read labels for sodium content; written dietary  information and a low sodium cookbook were given to her  - saw cardiology ([facility]) 05/10/2019 - BNP 06/02/2019 was 117.0 - she says that she's received her flu vaccine for this season  2: HTN- - BP mildly elevated today - says that she sees PCP ([facility]) in ~ 2 weeks - BMP 06/05/2019 reviewed and showed sodium 139, potassium 3.9, creatinine 1.40 and GFR 48  3: Sleep apnea- - bipap titration study done 03/20/2019 - waiting on bipap equipment  4: DM- - nonfasting glucose in clinic today was 303 - she says that she ate a ham/cheese sandwich this morning and had forgotten to take her insulin this morning - A1c 04/10/2019 was 13.9%  Patient did not bring her medications nor a list. Each medication was verbally reviewed with the patient and she was encouraged to bring the bottles to every visit to confirm accuracy of list.  Return in 2 months or sooner for any questions/problems before then.

## 2019-06-28 NOTE — Telephone Encounter (Signed)
[facility] called and states that they can not fill the recent lyrica script that was sent over due to it having 6 refills, they can only fill up to 5 with it being a controlled medication.  Script needs to be resent to pharmacy with 5 refills.

## 2019-06-29 ENCOUNTER — Other Ambulatory Visit: Payer: Self-pay

## 2019-06-29 ENCOUNTER — Encounter: Payer: Self-pay | Admitting: Family

## 2019-06-29 ENCOUNTER — Ambulatory Visit: Payer: Medicaid Other | Attending: Family | Admitting: Family

## 2019-06-29 VITALS — BP 142/93 | HR 78 | Resp 20 | Ht 66.0 in | Wt 231.4 lb

## 2019-06-29 DIAGNOSIS — G4730 Sleep apnea, unspecified: Secondary | ICD-10-CM

## 2019-06-29 DIAGNOSIS — I110 Hypertensive heart disease with heart failure: Secondary | ICD-10-CM | POA: Diagnosis not present

## 2019-06-29 DIAGNOSIS — I509 Heart failure, unspecified: Secondary | ICD-10-CM | POA: Insufficient documentation

## 2019-06-29 DIAGNOSIS — D649 Anemia, unspecified: Secondary | ICD-10-CM | POA: Insufficient documentation

## 2019-06-29 DIAGNOSIS — B182 Chronic viral hepatitis C: Secondary | ICD-10-CM | POA: Diagnosis not present

## 2019-06-29 DIAGNOSIS — Z794 Long term (current) use of insulin: Secondary | ICD-10-CM | POA: Diagnosis not present

## 2019-06-29 DIAGNOSIS — E119 Type 2 diabetes mellitus without complications: Secondary | ICD-10-CM | POA: Insufficient documentation

## 2019-06-29 DIAGNOSIS — N1831 Chronic kidney disease, stage 3a: Secondary | ICD-10-CM

## 2019-06-29 DIAGNOSIS — Z79899 Other long term (current) drug therapy: Secondary | ICD-10-CM | POA: Insufficient documentation

## 2019-06-29 DIAGNOSIS — Z8614 Personal history of Methicillin resistant Staphylococcus aureus infection: Secondary | ICD-10-CM | POA: Diagnosis not present

## 2019-06-29 DIAGNOSIS — I10 Essential (primary) hypertension: Secondary | ICD-10-CM

## 2019-06-29 DIAGNOSIS — E1122 Type 2 diabetes mellitus with diabetic chronic kidney disease: Secondary | ICD-10-CM

## 2019-06-29 DIAGNOSIS — I5032 Chronic diastolic (congestive) heart failure: Secondary | ICD-10-CM

## 2019-06-29 LAB — GLUCOSE, CAPILLARY: Glucose-Capillary: 303 mg/dL — ABNORMAL HIGH (ref 70–99)

## 2019-06-29 MED ORDER — LYRICA 75 MG PO CAPS: 75.0000 mg | ORAL_CAPSULE | Freq: Two times a day (BID) | ORAL | 5 refills | Status: DC

## 2019-06-29 NOTE — Patient Instructions (Addendum)
Begin weighing daily and call for an overnight weight gain of > 2 pounds or a weekly weight gain of >5 pounds. 

## 2019-06-29 NOTE — Telephone Encounter (Signed)
Done

## 2019-07-10 ENCOUNTER — Other Ambulatory Visit: Payer: Self-pay

## 2019-07-10 ENCOUNTER — Ambulatory Visit: Payer: Medicaid Other | Attending: Family Medicine | Admitting: Family Medicine

## 2019-07-10 ENCOUNTER — Encounter: Payer: Self-pay | Admitting: Family Medicine

## 2019-07-10 VITALS — BP 165/79 | HR 87 | Temp 98.0°F | Ht 66.0 in | Wt 239.0 lb

## 2019-07-10 DIAGNOSIS — G4733 Obstructive sleep apnea (adult) (pediatric): Secondary | ICD-10-CM | POA: Diagnosis not present

## 2019-07-10 DIAGNOSIS — E1149 Type 2 diabetes mellitus with other diabetic neurological complication: Secondary | ICD-10-CM

## 2019-07-10 DIAGNOSIS — I110 Hypertensive heart disease with heart failure: Secondary | ICD-10-CM | POA: Diagnosis not present

## 2019-07-10 DIAGNOSIS — E785 Hyperlipidemia, unspecified: Secondary | ICD-10-CM | POA: Insufficient documentation

## 2019-07-10 DIAGNOSIS — Z79899 Other long term (current) drug therapy: Secondary | ICD-10-CM | POA: Diagnosis not present

## 2019-07-10 DIAGNOSIS — E1140 Type 2 diabetes mellitus with diabetic neuropathy, unspecified: Secondary | ICD-10-CM | POA: Diagnosis not present

## 2019-07-10 DIAGNOSIS — I10 Essential (primary) hypertension: Secondary | ICD-10-CM

## 2019-07-10 DIAGNOSIS — E11319 Type 2 diabetes mellitus with unspecified diabetic retinopathy without macular edema: Secondary | ICD-10-CM | POA: Diagnosis not present

## 2019-07-10 DIAGNOSIS — B182 Chronic viral hepatitis C: Secondary | ICD-10-CM | POA: Insufficient documentation

## 2019-07-10 DIAGNOSIS — Z794 Long term (current) use of insulin: Secondary | ICD-10-CM | POA: Diagnosis not present

## 2019-07-10 DIAGNOSIS — D509 Iron deficiency anemia, unspecified: Secondary | ICD-10-CM | POA: Insufficient documentation

## 2019-07-10 DIAGNOSIS — Z888 Allergy status to other drugs, medicaments and biological substances status: Secondary | ICD-10-CM | POA: Diagnosis not present

## 2019-07-10 DIAGNOSIS — E11311 Type 2 diabetes mellitus with unspecified diabetic retinopathy with macular edema: Secondary | ICD-10-CM

## 2019-07-10 DIAGNOSIS — I5033 Acute on chronic diastolic (congestive) heart failure: Secondary | ICD-10-CM | POA: Diagnosis not present

## 2019-07-10 DIAGNOSIS — Z9111 Patient's noncompliance with dietary regimen: Secondary | ICD-10-CM | POA: Diagnosis not present

## 2019-07-10 LAB — POCT GLYCOSYLATED HEMOGLOBIN (HGB A1C): HbA1c, POC (controlled diabetic range): 10.7 % — AB (ref 0.0–7.0)

## 2019-07-10 LAB — GLUCOSE, POCT (MANUAL RESULT ENTRY): POC Glucose: 354 mg/dl — AB (ref 70–99)

## 2019-07-10 MED ORDER — VICTOZA 18 MG/3ML ~~LOC~~ SOPN: PEN_INJECTOR | SUBCUTANEOUS | 6 refills | Status: DC

## 2019-07-10 MED ORDER — AMLODIPINE BESYLATE 10 MG PO TABS: 10.0000 mg | ORAL_TABLET | Freq: Every day | ORAL | 5 refills | Status: DC

## 2019-07-10 MED ORDER — FUROSEMIDE 40 MG PO TABS: 40.0000 mg | ORAL_TABLET | Freq: Two times a day (BID) | ORAL | 6 refills | Status: DC

## 2019-07-10 MED ORDER — LANTUS SOLOSTAR 100 UNIT/ML ~~LOC~~ SOPN: 65.0000 [IU] | PEN_INJECTOR | Freq: Every day | SUBCUTANEOUS | 5 refills | Status: DC

## 2019-07-10 MED ORDER — HYDRALAZINE HCL 100 MG PO TABS: 100.0000 mg | ORAL_TABLET | Freq: Three times a day (TID) | ORAL | 6 refills | Status: DC

## 2019-07-10 MED ORDER — ATORVASTATIN CALCIUM 40 MG PO TABS: 40.0000 mg | ORAL_TABLET | Freq: Every day | ORAL | 6 refills | Status: DC

## 2019-07-10 NOTE — Patient Instructions (Signed)

## 2019-07-10 NOTE — Progress Notes (Signed)
Subjective:  Patient ID: [name] [name], female    DOB: [birth year]  Age: 58 y.o. MRN: [id number]  CC: Hospitalization Follow-up   HPI [name] [name]  is a 58 year old female with a history of type 2 diabetes mellitus (A1c10.7), diabetic neuropathy hypertension, diabetic retinopathy, hyperlipidemia, hepatitis C (completed treatment with harvoni), OSA  who presents today for a follow up visit. Last month she was hospitalized for acute CHF exacerbation and acute respiratory failure treated with IV diuretics and Lasix. She also received 1 unit PRBC for anemia with Hgb of 7.7 which improved to 9.1 on discharge.   She feels fine today, is yet to obtain her CPAP machine. Sugars have been 109 at home she says and she has been compliant with Lantus and Novolog but  it is 354 in the clinic and she has a cup of iced tea which she got from [place] out along with a cheese burger.She endorses non compliance with a diabetic diet as it is "hard" but promises to work on it. Denies presence of dyspnea, pedal edema, hematochezia or melena.  Past Medical History:  Diagnosis Date  . Anemia   . CHF (congestive heart failure) ([place])   . Chronic hepatitis C without hepatic coma ([place]) 11/09/2016  . Diabetes mellitus   . Fibroids   . 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 Hypertension   . MRSA (methicillin resistant Staphylococcus aureus)   . Trichomonas   . VAGINITIS, BACTERIAL, RECURRENT 08/15/2007   Qualifier: Diagnosis of  By: Radene Ou MD, [place]      Past Surgical History:  Procedure Laterality Date  . BREAST BIOPSY Left 2018  . CESAREAN SECTION     breech    Family History  Problem Relation Age of Onset  . Colon cancer Mother   . Liver disease Sister   . Other Neg Hx   . Breast cancer Neg Hx   . Esophageal cancer Neg Hx   . Rectal cancer Neg Hx     Allergies  Allergen Reactions  . Lisinopril Swelling    Outpatient Medications Prior to Visit  Medication  Sig Dispense Refill  . albuterol (VENTOLIN HFA) 108 (90 Base) MCG/ACT inhaler Inhale 2 puffs into the lungs every 6 (six) hours as needed for wheezing or shortness of breath. 18 g 3  . amLODipine (NORVASC) 10 MG tablet Take 1 tablet (10 mg total) by mouth daily. 30 tablet 5  . atorvastatin (LIPITOR) 40 MG tablet Take 1 tablet (40 mg total) by mouth daily. 30 tablet 6  . blood glucose meter kit and supplies KIT Dispense based on patient and insurance preference. Use up to four times daily as directed. (FOR ICD-9 250.00, 250.01). 1 each 2  . furosemide (LASIX) 40 MG tablet Take 1 tablet (40 mg total) by mouth 2 (two) times daily. 60 tablet 0  . hydrALAZINE (APRESOLINE) 50 MG tablet Take 1 tablet (50 mg total) by mouth 3 (three) times daily. 90 tablet 0  . insulin aspart (NOVOLOG) 100 UNIT/ML injection Inject 6 Units into the skin 3 (three) times daily before meals.    . Insulin Glargine (LANTUS SOLOSTAR) 100 UNIT/ML Solostar Pen Inject 65 Units into the skin at bedtime. 5 pen 5  . labetalol (NORMODYNE) 100 MG tablet Take 1 tablet (100 mg total) by mouth 2 (two) times daily. 180 tablet 3  . LYRICA 75 MG capsule Take 1 capsule (75 mg total) by mouth 2 (two) times daily. [place]  capsule 5   No facility-administered medications prior to visit.     ROS Review of Systems  Constitutional: Negative for activity change, appetite change and fatigue.  HENT: Negative for congestion, sinus pressure and sore throat.   Eyes: Negative for visual disturbance.  Respiratory: Negative for cough, chest tightness, shortness of breath and wheezing.   Cardiovascular: Negative for chest pain and palpitations.  Gastrointestinal: Negative for abdominal distention, abdominal pain and constipation.  Endocrine: Negative for polydipsia.  Genitourinary: Negative for dysuria and frequency.  Musculoskeletal: Negative for arthralgias and back pain.  Skin: Negative for rash.  Neurological: Negative for tremors, light-headedness  and numbness.  Hematological: Does not bruise/bleed easily.  Psychiatric/Behavioral: Negative for agitation and behavioral problems.    Objective:  BP (!) 165/79   Pulse 87   Temp 98 F (36.7 C) (Oral)   Ht '5\' 6"'  (1.676 m)   Wt 239 lb (108.4 kg)   LMP 04/01/2010   SpO2 99%   BMI 38.58 kg/m   BP/Weight 07/10/2019 06/29/2019 21/30/8657  Systolic BP 846 962 952  Diastolic BP 79 93 86  Wt. (Lbs) 239 231.4 240.3  BMI 38.58 37.35 41.25      Physical Exam Constitutional:      Appearance: She is well-developed.  Neck:     Vascular: No JVD.  Cardiovascular:     Rate and Rhythm: Normal rate.     Heart sounds: Normal heart sounds. No murmur.  Pulmonary:     Effort: Pulmonary effort is normal.     Breath sounds: Normal breath sounds. No wheezing or rales.  Chest:     Chest wall: No tenderness.  Abdominal:     General: Bowel sounds are normal. There is no distension.     Palpations: Abdomen is soft. There is no mass.     Tenderness: There is no abdominal tenderness.  Musculoskeletal:        General: Normal range of motion.     Right lower leg: No edema.     Left lower leg: No edema.  Neurological:     Mental Status: She is alert and oriented to person, place, and time.  Psychiatric:        Mood and Affect: Mood normal.     CMP Latest Ref Rng & Units 06/05/2019 06/04/2019 06/02/2019  Glucose 70 - 99 mg/dL 155(H) 197(H) 279(H)  BUN 6 - 20 mg/dL 26(H) 26(H) 25(H)  Creatinine 0.44 - 1.00 mg/dL 1.40(H) 1.37(H) 1.70(H)  Sodium 135 - 145 mmol/L 139 142 142  Potassium 3.5 - 5.1 mmol/L 3.9 3.6 4.2  Chloride 98 - 111 mmol/L 110 112(H) 112(H)  CO2 22 - 32 mmol/L 22 20(L) 20(L)  Calcium 8.9 - 10.3 mg/dL 8.1(L) 8.3(L) 8.6(L)  Total Protein 6.5 - 8.1 g/dL - - -  Total Bilirubin 0.3 - 1.2 mg/dL - - -  Alkaline Phos 38 - 126 U/L - - -  AST 15 - 41 U/L - - -  ALT 0 - 44 U/L - - -    Lipid Panel     Component Value Date/Time   CHOL 147 01/29/2019 1118   TRIG 102 01/29/2019  1118   HDL 59 01/29/2019 1118   CHOLHDL 2.5 01/29/2019 1118   CHOLHDL 3 06/18/2014 0951   VLDL 19.6 06/18/2014 0951   LDLCALC 68 01/29/2019 1118    CBC    Component Value Date/Time   WBC 4.6 06/05/2019 0442   RBC 3.41 (L) 06/05/2019 0442   HGB 9.1 (L) 06/05/2019  0442   HGB 11.3 08/31/2018 0925   HCT 28.3 (L) 06/05/2019 0442   HCT 35.3 08/31/2018 0925   PLT 176 06/05/2019 0442   PLT 208 08/31/2018 0925   MCV 83.0 06/05/2019 0442   MCV 85 08/31/2018 0925   MCH 26.7 06/05/2019 0442   MCHC 32.2 06/05/2019 0442   RDW 13.7 06/05/2019 0442   RDW 14.2 08/31/2018 0925   LYMPHSABS 1.7 06/05/2019 0442   LYMPHSABS 1.7 08/31/2018 0925   MONOABS 0.4 06/05/2019 0442   EOSABS 0.0 06/05/2019 0442   EOSABS 0.0 08/31/2018 0925   BASOSABS 0.0 06/05/2019 0442   BASOSABS 0.0 08/31/2018 0925    Lab Results  Component Value Date   HGBA1C 10.7 (A) 07/10/2019    Assessment & Plan:  1. Type 2 diabetes mellitus with other neurologic complication, with long-term current use of insulin (HCC) Uncontrolled with A1c of 10.7; goal is <7 Non compliance with diet plays a huge role She was previously on Victoza which somehow got dropped; I have restarted this Advised to hold off on Novolog due to multiple frequencies of injectables Referral to Nutrition; advised home cooked meals are healthier options Counseled on Diabetic diet, my plate method, 026 minutes of moderate intensity exercise/week Blood sugar logs with fasting goals of 80-120 mg/dl, random of less than 180 and in the event of sugars less than 60 mg/dl or greater than 400 mg/dl encouraged to notify the clinic. Advised on the need for annual eye exams, annual foot exams, Pneumonia vaccine. - Glucose (CBG) - HgB A1c - Referral to Nutrition and Diabetes Services - liraglutide (VICTOZA) 18 MG/3ML SOPN; Start 0.83m SQ once a day for 7 days, then increase to 1.228monce a day for 7 days, then increase to 1.8 mg daily thereafter  Dispense: 3 pen;  Refill: 6 - atorvastatin (LIPITOR) 40 MG tablet; Take 1 tablet (40 mg total) by mouth daily.  Dispense: 30 tablet; Refill: 6 - Insulin Glargine (LANTUS SOLOSTAR) 100 UNIT/ML Solostar Pen; Inject 65 Units into the skin at bedtime.  Dispense: 5 pen; Refill: 5  2. Essential hypertension Uncontrolled Increased Hydralazine dose from 50 to 10011mounseled on blood pressure goal of less than 130/80, low-sodium, DASH diet, medication compliance, 150 minutes of moderate intensity exercise per week. Discussed medication compliance, adverse effects. - amLODipine (NORVASC) 10 MG tablet; Take 1 tablet (10 mg total) by mouth daily.  Dispense: 30 tablet; Refill: 5 - hydrALAZINE (APRESOLINE) 100 MG tablet; Take 1 tablet (100 mg total) by mouth 3 (three) times daily.  Dispense: 90 tablet; Refill: 6  3. 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  Dispense: 5 pen; Refill: 5  4. Iron deficiency anemia, unspecified iron deficiency anemia type Discharge A1c was 9.1 Colonoscopy un revealing in 3 /2020 Will send off cbc - CBC with Differential/Platelet   5. Acute on chronic diastolic heart failure (HCC) EF 50-55% from echo of 05/2019 Euvolemic Not on ACEI due to allergy - furosemide (LASIX) 40 MG tablet; Take 1 tablet (40 mg total) by mouth 2 (two) times daily.  Dispense: 60 tablet; Refill: 6  Health Care Maintenance: up to date No orders of the defined types were placed in this encounter.   Return in about 3 months (around 10/08/2019) for chronic medical conditions.  [name], MD, FAAFP. [facility] [place] [facility] [place], [place]   07/10/2019, 3:08 PM

## 2019-07-11 ENCOUNTER — Other Ambulatory Visit: Payer: Self-pay | Admitting: Family Medicine

## 2019-07-11 ENCOUNTER — Telehealth: Payer: Self-pay

## 2019-07-11 ENCOUNTER — Telehealth: Payer: Self-pay | Admitting: Family Medicine

## 2019-07-11 ENCOUNTER — Encounter: Payer: Self-pay | Admitting: Family Medicine

## 2019-07-11 DIAGNOSIS — D509 Iron deficiency anemia, unspecified: Secondary | ICD-10-CM

## 2019-07-11 LAB — CBC WITH DIFFERENTIAL/PLATELET
Basophils Absolute: 0 10*3/uL (ref 0.0–0.2)
Basos: 0 %
EOS (ABSOLUTE): 0 10*3/uL (ref 0.0–0.4)
Eos: 0 %
Hematocrit: 30.9 % — ABNORMAL LOW (ref 34.0–46.6)
Hemoglobin: 9.8 g/dL — ABNORMAL LOW (ref 11.1–15.9)
Immature Grans (Abs): 0 10*3/uL (ref 0.0–0.1)
Immature Granulocytes: 0 %
Lymphocytes Absolute: 1.3 10*3/uL (ref 0.7–3.1)
Lymphs: 33 %
MCH: 27.4 pg (ref 26.6–33.0)
MCHC: 31.7 g/dL (ref 31.5–35.7)
MCV: 86 fL (ref 79–97)
Monocytes Absolute: 0.3 10*3/uL (ref 0.1–0.9)
Monocytes: 7 %
Neutrophils Absolute: 2.3 10*3/uL (ref 1.4–7.0)
Neutrophils: 60 %
Platelets: 171 10*3/uL (ref 150–450)
RBC: 3.58 x10E6/uL — ABNORMAL LOW (ref 3.77–5.28)
RDW: 12.7 % (ref 11.7–15.4)
WBC: 3.8 10*3/uL (ref 3.4–10.8)

## 2019-07-11 NOTE — Telephone Encounter (Signed)
Patient was called and voicemail is currently full.

## 2019-07-11 NOTE — Telephone Encounter (Signed)
Patient returned phone call and was informed of lab results and referral for GI.

## 2019-07-11 NOTE — Telephone Encounter (Signed)
-----   Message from [name], MD sent at 07/11/2019  6:40 AM EST ----- Labs reveal persisting anemia which has improved from 1 month prior. I am referring her to GI for further evaluation

## 2019-07-11 NOTE — Telephone Encounter (Signed)
Can you please assist with CPAP for this patient. Sleep study was ordered by Cardiology and I am unsure if they started the process of the order. Thanks

## 2019-07-21 DIAGNOSIS — Z7189 Other specified counseling: Secondary | ICD-10-CM | POA: Insufficient documentation

## 2019-07-21 DIAGNOSIS — I517 Cardiomegaly: Secondary | ICD-10-CM | POA: Insufficient documentation

## 2019-07-21 NOTE — Progress Notes (Deleted)
Cardiology Office Note   Date:  07/21/2019   ID:  [name] [name], DOB 1960-07-29, MRN [id number]  PCP:  [name], MD  Cardiologist:   Minus Breeding, MD Referring:  [name], MD   No chief complaint on file.     History of Present Illness: [name] [name] is a 59 y.o. female who is referred for evaluation of SOB.  She had an echo with a mildly reduced EF of 50%.  There is moderate concentric hypertrophy.  We saw her in the past because of difficult to control hypertension and poorly controlled sugars.   ***   *** I saw her most recently for dyspnea.   I increased her lisinopril.  She has also been   She comes back today because of dyspnea.  She has not been keeping a blood pressure check because she does not have a machine at home.  She had an allergic reaction it looks like the lisinopril.  She had facial swelling and she was in the emergency room.  I reviewed these records.  She was taken off of that.  She gets short of breath with activity such as walking 50 yards on level ground.  She was short of breath walking into the office but her oxygen saturation was in the mid 90s.  She is not describing PND or orthopnea.  She is not describing chest pressure, neck or arm discomfort.  Of note she has been found to have sleep apnea and was supposed to get BiPAP but this is not yet been ordered.    Past Medical History:  Diagnosis Date  . Anemia   . CHF (congestive heart failure) ([place])   . Chronic hepatitis C without hepatic coma ([place]) 11/09/2016  . Diabetes mellitus   . Fibroids   . 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 Hypertension   . MRSA (methicillin resistant Staphylococcus aureus)   . Trichomonas   . VAGINITIS, BACTERIAL, RECURRENT 08/15/2007   Qualifier: Diagnosis of  By: Radene Ou MD, [place]      Past Surgical History:  Procedure Laterality Date  . BREAST BIOPSY Left 2018  . CESAREAN SECTION     breech     Current  Outpatient Medications  Medication Sig Dispense Refill  . albuterol (VENTOLIN HFA) 108 (90 Base) MCG/ACT inhaler Inhale 2 puffs into the lungs every 6 (six) hours as needed for wheezing or shortness of breath. 18 g 3  . amLODipine (NORVASC) 10 MG tablet Take 1 tablet (10 mg total) by mouth daily. 30 tablet 5  . atorvastatin (LIPITOR) 40 MG tablet Take 1 tablet (40 mg total) by mouth daily. 30 tablet 6  . blood glucose meter kit and supplies KIT Dispense based on patient and insurance preference. Use up to four times daily as directed. (FOR ICD-9 250.00, 250.01). 1 each 2  . furosemide (LASIX) 40 MG tablet Take 1 tablet (40 mg total) by mouth 2 (two) times daily. 60 tablet 6  . hydrALAZINE (APRESOLINE) 100 MG tablet Take 1 tablet (100 mg total) by mouth 3 (three) times daily. 90 tablet 6  . insulin aspart (NOVOLOG) 100 UNIT/ML injection Inject 6 Units into the skin 3 (three) times daily before meals.    . Insulin Glargine (LANTUS SOLOSTAR) 100 UNIT/ML Solostar Pen Inject 65 Units into the skin at bedtime. 5 pen 5  . labetalol (NORMODYNE) 100 MG tablet Take 1 tablet (100 mg total) by mouth 2 (two) times  daily. 180 tablet 3  . liraglutide (VICTOZA) 18 MG/3ML SOPN Start 0.73m SQ once a day for 7 days, then increase to 1.226monce a day for 7 days, then increase to 1.8 mg daily thereafter 3 pen 6  . LYRICA 75 MG capsule Take 1 capsule (75 mg total) by mouth 2 (two) times daily. 60 capsule 5   No current facility-administered medications for this visit.    Allergies:   Lisinopril    ROS:  Please see the history of present illness.   Otherwise, review of systems are positive for none.   All other systems are reviewed and negative.    PHYSICAL EXAM: VS:  LMP 04/01/2010  , BMI There is no height or weight on file to calculate BMI. GENERAL:  Well appearing NECK:  No jugular venous distention, waveform within normal limits, carotid upstroke brisk and symmetric, no bruits, no thyromegaly LUNGS:  Clear  to auscultation bilaterally CHEST:  Unremarkable HEART:  PMI not displaced or sustained,S1 and S2 within normal limits, no S3, no S4, no clicks, no rubs, *** murmurs ABD:  Flat, positive bowel sounds normal in frequency in pitch, no bruits, no rebound, no guarding, no midline pulsatile mass, no hepatomegaly, no splenomegaly EXT:  2 plus pulses throughout, no edema, no cyanosis no clubbing    ***GENERAL:  Well appearing NECK:  No jugular venous distention, waveform within normal limits, carotid upstroke brisk and symmetric, no bruits, no thyromegaly LUNGS:  Clear to auscultation bilaterally CHEST:  Unremarkable HEART:  PMI not displaced or sustained,S1 and S2 within normal limits, no S3, no S4, no clicks, no rubs, 2 out of 6 brief apical systolic murmur nonradiating and not changing with a Valsalva maneuver, no diastolic murmurs ABD:  Flat, positive bowel sounds normal in frequency in pitch, no bruits, no rebound, no guarding, no midline pulsatile mass, no hepatomegaly, no splenomegaly EXT:  2 plus pulses throughout, no edema, no cyanosis no clubbing   EKG:  EKG is ***ordered today. ***  Recent Labs: 04/09/2019: ALT 12 06/02/2019: B Natriuretic Peptide 117.0 06/05/2019: BUN 26; Creatinine, Ser 1.40; Potassium 3.9; Sodium 139 07/10/2019: Hemoglobin 9.8; Platelets 171    Lipid Panel    Component Value Date/Time   CHOL 147 01/29/2019 1118   TRIG 102 01/29/2019 1118   HDL 59 01/29/2019 1118   CHOLHDL 2.5 01/29/2019 1118   CHOLHDL 3 06/18/2014 0951   VLDL 19.6 06/18/2014 0951   LDLCALC 68 01/29/2019 1118      Wt Readings from Last 3 Encounters:  07/10/19 239 lb (108.4 kg)  06/29/19 231 lb 6.4 oz (105 kg)  06/05/19 240 lb 4.8 oz (109 kg)      Other studies Reviewed: Additional studies/ records that were reviewed today include: *** Review of the above records demonstrates:  ***   ASSESSMENT AND PLAN:  DM:       The blood sugars poorly controlled with an A1c of *** 13.9.   I referred her back to NeCharlott RakesMD stressing the importance of this with the patient.  She says she has been referred to endocrinology.   HTN:     BP is *** not controlled.  She is allergic to lisinopril possibly.  I am going to add labetalol 100 mg twice daily.  I went back through the list of meds that she was on before.  We are going to try to send her a blood pressure cuff.  DYSPNEA:   ***  I think this is multifactorial.  I am getting better control of the blood pressure.  I will send her for a PYP scan as below.  She might need MRI eventually.  OBESITY:   ***  We talked about weight loss.  SLEEP APNEA:   *** I contacted our sleep clinic as the patient has not been notified about when she will get her BiPAP.  GERD taking care of this.    CKD:  *** Reduced to levels as above.  We will follow this.   COVID 19 EDUCATION:  ***   Current medicines are reviewed at length with the patient today.  The patient does not have concerns regarding medicines.  The following changes have been made:  ***  Labs/ tests ordered today include: ***  No orders of the defined types were placed in this encounter.    Disposition:   FU with me in *** months    Signed, Minus Breeding, MD  07/21/2019 1:23 PM    [facility]

## 2019-07-23 ENCOUNTER — Telehealth: Payer: Self-pay | Admitting: *Deleted

## 2019-07-23 ENCOUNTER — Ambulatory Visit: Payer: Medicaid Other | Admitting: Cardiology

## 2019-07-23 NOTE — Telephone Encounter (Signed)
Patient did not call back   Per Dr Percival Spanish she cannot be added back to our schedule unless referred by her PCP and the appointment is documented and confirmed by the patient.

## 2019-07-23 NOTE — Telephone Encounter (Signed)
Left message to call back regarding appointment today. Scheduled at 11:00 am, not arrived yet

## 2019-08-01 ENCOUNTER — Ambulatory Visit: Payer: Medicaid Other | Admitting: Dietician

## 2019-08-01 NOTE — Progress Notes (Signed)
Cardiology Office Note   Date:  08/02/2019   ID:  [name] [name], DOB 11-10-1960, MRN [id number]  PCP:  [name], MD  Cardiologist:   Minus Breeding, MD Referring:  [name], MD   Chief Complaint  Patient presents with  . Shortness of Breath      History of Present Illness: [name] [name] is a 59 y.o. female who is referred for evaluation of SOB.  She had an echo with a mildly reduced EF of 50%.  There is moderate concentric hypertrophy.  We saw her in the past because of difficult to control hypertension and poorly controlled sugars.   She actually feels better than she did previously.  Her blood sugars are better controlled.  Today her blood pressure is as recorded below.  She does not feel lightheaded and actually feels well.  She is having trouble using her machine.  She is unable to use a BP machine that she has at home.  She is not sure what her BP is.  She is feeling better with her breathing.  The patient denies any new symptoms such as chest discomfort, neck or arm discomfort. There has been no new shortness of breath, PND or orthopnea. There have been no reported palpitations, presyncope or syncope.  Her last echo in November demonstrated that her ejection fraction is well-preserved.  There was actually no left ventricular hypertrophy.   Past Medical History:  Diagnosis Date  . Anemia   . CHF (congestive heart failure) ([place])   . Chronic hepatitis C without hepatic coma ([place]) 11/09/2016  . Diabetes mellitus   . Fibroids   . 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 Hypertension   . MRSA (methicillin resistant Staphylococcus aureus)   . Trichomonas   . VAGINITIS, BACTERIAL, RECURRENT 08/15/2007   Qualifier: Diagnosis of  By: Radene Ou MD, [place]      Past Surgical History:  Procedure Laterality Date  . BREAST BIOPSY Left 2018  . CESAREAN SECTION     breech     Current Outpatient Medications  Medication Sig Dispense  Refill  . albuterol (VENTOLIN HFA) 108 (90 Base) MCG/ACT inhaler Inhale 2 puffs into the lungs every 6 (six) hours as needed for wheezing or shortness of breath. 18 g 3  . amLODipine (NORVASC) 10 MG tablet Take 1 tablet (10 mg total) by mouth daily. 30 tablet 5  . atorvastatin (LIPITOR) 40 MG tablet Take 1 tablet (40 mg total) by mouth daily. 30 tablet 6  . blood glucose meter kit and supplies KIT Dispense based on patient and insurance preference. Use up to four times daily as directed. (FOR ICD-9 250.00, 250.01). 1 each 2  . furosemide (LASIX) 40 MG tablet Take 1 tablet (40 mg total) by mouth 2 (two) times daily. 60 tablet 6  . hydrALAZINE (APRESOLINE) 100 MG tablet Take 1 tablet (100 mg total) by mouth 3 (three) times daily. 90 tablet 6  . insulin aspart (NOVOLOG) 100 UNIT/ML injection Inject 6 Units into the skin 3 (three) times daily before meals.    . Insulin Glargine (LANTUS SOLOSTAR) 100 UNIT/ML Solostar Pen Inject 65 Units into the skin at bedtime. 5 pen 5  . labetalol (NORMODYNE) 100 MG tablet Take 1 tablet (100 mg total) by mouth 2 (two) times daily. 180 tablet 3  . liraglutide (VICTOZA) 18 MG/3ML SOPN Start 0.66m SQ once a day for 7 days, then increase to 1.261monce a day for  7 days, then increase to 1.8 mg daily thereafter 3 pen 6  . LYRICA 75 MG capsule Take 1 capsule (75 mg total) by mouth 2 (two) times daily. 60 capsule 5   No current facility-administered medications for this visit.    Allergies:   Lisinopril    ROS:  Please see the history of present illness.   Otherwise, review of systems are positive for none.   All other systems are reviewed and negative.    PHYSICAL EXAM: VS:  BP (!) 87/53   Pulse 81   Temp (!) 97.3 F (36.3 C)   Ht '5\' 4"'  (1.626 m)   Wt 241 lb (109.3 kg)   LMP 04/01/2010   SpO2 98%   BMI 41.37 kg/m  , BMI Body mass index is 41.37 kg/m. GENERAL:  Well appearing NECK:  No jugular venous distention, waveform within normal limits, carotid upstroke  brisk and symmetric, no bruits, no thyromegaly LUNGS:  Clear to auscultation bilaterally CHEST:  Unremarkable HEART:  PMI not displaced or sustained,S1 and S2 within normal limits, no S3, no S4, no clicks, no rubs, soft apical systolic murmur heard best at the left upper sternal border, no diastolic murmurs ABD:  Flat, positive bowel sounds normal in frequency in pitch, no bruits, no rebound, no guarding, no midline pulsatile mass, no hepatomegaly, no splenomegaly EXT:  2 plus pulses throughout, no edema, no cyanosis no clubbing   EKG:  EKG is ordered today. Sinus rhythm, rate 89, axis within normal limits, intervals within normal limits, no acute ST-T wave changes.  Recent Labs: 04/09/2019: ALT 12 06/02/2019: B Natriuretic Peptide 117.0 06/05/2019: BUN 26; Creatinine, Ser 1.40; Potassium 3.9; Sodium 139 07/10/2019: Hemoglobin 9.8; Platelets 171    Lipid Panel    Component Value Date/Time   CHOL 147 01/29/2019 1118   TRIG 102 01/29/2019 1118   HDL 59 01/29/2019 1118   CHOLHDL 2.5 01/29/2019 1118   CHOLHDL 3 06/18/2014 0951   VLDL 19.6 06/18/2014 0951   LDLCALC 68 01/29/2019 1118      Wt Readings from Last 3 Encounters:  08/02/19 241 lb (109.3 kg)  07/10/19 239 lb (108.4 kg)  06/29/19 231 lb 6.4 oz (105 kg)      Other studies Reviewed: Additional studies/ records that were reviewed today include: Labs Review of the above records demonstrates:  See below.     ASSESSMENT AND PLAN:  DM:       The blood sugars poorly controlled with an A1c of 10.  This is improved from 13.9.   HTN:     BP is actually low today.  She feels fine.  We brought in a blood pressure cuff that demonstrated how to use it.  It turns out she has the same 1 at home.  She has no LVH so I have no plans for further imaging.  She needs weight loss and continued salt restriction and increased physical activity.  DYSPNEA:   This is multifactorial.  She is feeling better.  No change in therapy.  OBESITY:    We talked about weight loss.  SLEEP APNEA:   I had our sleep apnea nurse, and speak with her.  She has kept the night approval so far for her BiPAP.  We are trying to take care of this for her.   CKD: Creatinine was 1.4.  No change in therapy.   COVID 19 EDUCATION: We talked about the vaccine.  She will get it when able.  Current medicines are reviewed  at length with the patient today.  The patient does not have concerns regarding medicines.  The following changes have been made:  None  Labs/ tests ordered today include: None  Orders Placed This Encounter  Procedures  . EKG 12-Lead     Disposition:   FU with APP in six months    Signed, Minus Breeding, MD  08/02/2019 10:02 AM    [place]

## 2019-08-02 ENCOUNTER — Other Ambulatory Visit: Payer: Self-pay

## 2019-08-02 ENCOUNTER — Ambulatory Visit: Payer: Medicaid Other | Attending: Internal Medicine

## 2019-08-02 ENCOUNTER — Ambulatory Visit: Payer: Medicaid Other | Admitting: Cardiology

## 2019-08-02 ENCOUNTER — Encounter: Payer: Self-pay | Admitting: Cardiology

## 2019-08-02 VITALS — BP 87/53 | HR 81 | Temp 97.3°F | Ht 64.0 in | Wt 241.0 lb

## 2019-08-02 DIAGNOSIS — G4730 Sleep apnea, unspecified: Secondary | ICD-10-CM | POA: Diagnosis not present

## 2019-08-02 DIAGNOSIS — Z20822 Contact with and (suspected) exposure to covid-19: Secondary | ICD-10-CM | POA: Diagnosis not present

## 2019-08-02 DIAGNOSIS — E1122 Type 2 diabetes mellitus with diabetic chronic kidney disease: Secondary | ICD-10-CM

## 2019-08-02 DIAGNOSIS — Z794 Long term (current) use of insulin: Secondary | ICD-10-CM | POA: Diagnosis not present

## 2019-08-02 DIAGNOSIS — N1831 Chronic kidney disease, stage 3a: Secondary | ICD-10-CM

## 2019-08-02 DIAGNOSIS — R0602 Shortness of breath: Secondary | ICD-10-CM

## 2019-08-02 DIAGNOSIS — Z7189 Other specified counseling: Secondary | ICD-10-CM | POA: Diagnosis not present

## 2019-08-02 DIAGNOSIS — E1121 Type 2 diabetes mellitus with diabetic nephropathy: Secondary | ICD-10-CM | POA: Diagnosis not present

## 2019-08-02 DIAGNOSIS — I10 Essential (primary) hypertension: Secondary | ICD-10-CM

## 2019-08-02 NOTE — Patient Instructions (Signed)
Medication Instructions:  No changes *If you need a refill on your cardiac medications before your next appointment, please call your pharmacy*  Lab Work: None  Testing/Procedures: None  Follow-Up: At [facility], you and your health needs are our priority.  As part of our continuing mission to provide you with exceptional heart care, we have created designated Provider Care Teams.  These Care Teams include your primary Cardiologist (physician) and Advanced Practice Providers (APPs -  Physician Assistants and Nurse Practitioners) who all work together to provide you with the care you need, when you need it.  Your next appointment:   6 month(s)  The format for your next appointment:   In Person  Provider:   You may see one of the following Advanced Practice Providers on your designated Care Team:    Rosaria Ferries, PA-C  Jory Sims, DNP, ANP  Cadence Kathlen Mody, NP   Other Instructions Keep a blood pressure diary

## 2019-08-03 LAB — NOVEL CORONAVIRUS, NAA: SARS-CoV-2, NAA: NOT DETECTED

## 2019-08-17 ENCOUNTER — Emergency Department: Payer: Medicaid Other

## 2019-08-17 ENCOUNTER — Inpatient Hospital Stay
Admission: EM | Admit: 2019-08-17 | Discharge: 2019-08-19 | DRG: 202 | Disposition: A | Discharge status: Home / Self Care | Payer: Medicaid Other | Attending: Internal Medicine | Admitting: Internal Medicine

## 2019-08-17 ENCOUNTER — Other Ambulatory Visit: Payer: Self-pay

## 2019-08-17 DIAGNOSIS — Z20822 Contact with and (suspected) exposure to covid-19: Secondary | ICD-10-CM | POA: Diagnosis not present

## 2019-08-17 DIAGNOSIS — E1169 Type 2 diabetes mellitus with other specified complication: Secondary | ICD-10-CM

## 2019-08-17 DIAGNOSIS — D509 Iron deficiency anemia, unspecified: Secondary | ICD-10-CM | POA: Diagnosis present

## 2019-08-17 DIAGNOSIS — J40 Bronchitis, not specified as acute or chronic: Principal | ICD-10-CM | POA: Diagnosis present

## 2019-08-17 DIAGNOSIS — R0600 Dyspnea, unspecified: Secondary | ICD-10-CM

## 2019-08-17 DIAGNOSIS — E785 Hyperlipidemia, unspecified: Secondary | ICD-10-CM | POA: Diagnosis present

## 2019-08-17 DIAGNOSIS — Z6841 Body Mass Index (BMI) 40.0 and over, adult: Secondary | ICD-10-CM

## 2019-08-17 DIAGNOSIS — Z79899 Other long term (current) drug therapy: Secondary | ICD-10-CM

## 2019-08-17 DIAGNOSIS — G4730 Sleep apnea, unspecified: Secondary | ICD-10-CM | POA: Diagnosis present

## 2019-08-17 DIAGNOSIS — Z87440 Personal history of urinary (tract) infections: Secondary | ICD-10-CM

## 2019-08-17 DIAGNOSIS — R072 Precordial pain: Secondary | ICD-10-CM | POA: Diagnosis present

## 2019-08-17 DIAGNOSIS — F1911 Other psychoactive substance abuse, in remission: Secondary | ICD-10-CM | POA: Diagnosis present

## 2019-08-17 DIAGNOSIS — Z794 Long term (current) use of insulin: Secondary | ICD-10-CM

## 2019-08-17 DIAGNOSIS — H109 Unspecified conjunctivitis: Secondary | ICD-10-CM | POA: Diagnosis present

## 2019-08-17 DIAGNOSIS — E119 Type 2 diabetes mellitus without complications: Secondary | ICD-10-CM

## 2019-08-17 DIAGNOSIS — R809 Proteinuria, unspecified: Secondary | ICD-10-CM | POA: Diagnosis present

## 2019-08-17 DIAGNOSIS — I119 Hypertensive heart disease without heart failure: Secondary | ICD-10-CM | POA: Diagnosis present

## 2019-08-17 DIAGNOSIS — J209 Acute bronchitis, unspecified: Secondary | ICD-10-CM

## 2019-08-17 DIAGNOSIS — Z8679 Personal history of other diseases of the circulatory system: Secondary | ICD-10-CM

## 2019-08-17 DIAGNOSIS — R079 Chest pain, unspecified: Secondary | ICD-10-CM

## 2019-08-17 DIAGNOSIS — Z800 Family history of malignant neoplasm of digestive organs: Secondary | ICD-10-CM

## 2019-08-17 DIAGNOSIS — J449 Chronic obstructive pulmonary disease, unspecified: Secondary | ICD-10-CM

## 2019-08-17 DIAGNOSIS — E1165 Type 2 diabetes mellitus with hyperglycemia: Secondary | ICD-10-CM | POA: Diagnosis present

## 2019-08-17 DIAGNOSIS — B182 Chronic viral hepatitis C: Secondary | ICD-10-CM | POA: Diagnosis present

## 2019-08-17 DIAGNOSIS — G4733 Obstructive sleep apnea (adult) (pediatric): Secondary | ICD-10-CM | POA: Diagnosis present

## 2019-08-17 DIAGNOSIS — Z888 Allergy status to other drugs, medicaments and biological substances status: Secondary | ICD-10-CM

## 2019-08-17 DIAGNOSIS — I10 Essential (primary) hypertension: Secondary | ICD-10-CM | POA: Diagnosis present

## 2019-08-17 DIAGNOSIS — N179 Acute kidney failure, unspecified: Secondary | ICD-10-CM | POA: Diagnosis present

## 2019-08-17 DIAGNOSIS — R0609 Other forms of dyspnea: Secondary | ICD-10-CM

## 2019-08-17 LAB — BASIC METABOLIC PANEL
Anion gap: 10 (ref 5–15)
BUN: 60 mg/dL — ABNORMAL HIGH (ref 6–20)
CO2: 22 mmol/L (ref 22–32)
Calcium: 8.1 mg/dL — ABNORMAL LOW (ref 8.9–10.3)
Chloride: 108 mmol/L (ref 98–111)
Creatinine, Ser: 2.44 mg/dL — ABNORMAL HIGH (ref 0.44–1.00)
GFR calc Af Amer: 24 mL/min — ABNORMAL LOW (ref >60)
GFR calc non Af Amer: 21 mL/min — ABNORMAL LOW (ref >60)
Glucose, Bld: 155 mg/dL — ABNORMAL HIGH (ref 70–99)
Potassium: 4.1 mmol/L (ref 3.5–5.1)
Sodium: 140 mmol/L (ref 135–145)

## 2019-08-17 LAB — CBC
HCT: 29 % — ABNORMAL LOW (ref 36.0–46.0)
Hemoglobin: 9.1 g/dL — ABNORMAL LOW (ref 12.0–15.0)
MCH: 27.1 pg (ref 26.0–34.0)
MCHC: 31.4 g/dL (ref 30.0–36.0)
MCV: 86.3 fL (ref 80.0–100.0)
Platelets: 201 10*3/uL (ref 150–400)
RBC: 3.36 MIL/uL — ABNORMAL LOW (ref 3.87–5.11)
RDW: 14 % (ref 11.5–15.5)
WBC: 5.4 10*3/uL (ref 4.0–10.5)
nRBC: 0 % (ref 0.0–0.2)

## 2019-08-17 LAB — TROPONIN I (HIGH SENSITIVITY)
Troponin I (High Sensitivity): 3 ng/L (ref <18)
Troponin I (High Sensitivity): 4 ng/L (ref <18)

## 2019-08-17 LAB — BRAIN NATRIURETIC PEPTIDE: B Natriuretic Peptide: 40 pg/mL (ref 0.0–100.0)

## 2019-08-17 MED ORDER — SODIUM CHLORIDE 0.9% FLUSH: 3.0000 mL | Freq: Once | INTRAVENOUS | Status: DC

## 2019-08-17 MED ORDER — SODIUM CHLORIDE 0.9 % IV BOLUS
250.0000 mL | Freq: Once | INTRAVENOUS | Status: AC
  Administered 2019-08-17: 250 mL via INTRAVENOUS

## 2019-08-17 MED ORDER — ASPIRIN 81 MG PO CHEW
324.0000 mg | CHEWABLE_TABLET | Freq: Once | ORAL | Status: AC
  Administered 2019-08-17: 21:00:00 324 mg via ORAL
  Filled 2019-08-17: qty 4

## 2019-08-17 NOTE — Discharge Instructions (Signed)
Thank you for letting us take care of you in the emergency department today.   Please continue to take any regular, prescribed medications.   Please follow up with: - Your primary care doctor to review your ER visit and follow up on your symptoms. You should have your kidney levels rechecked at that time.  Please return to the ER for any new or worsening symptoms.

## 2019-08-17 NOTE — ED Provider Notes (Signed)
[facility] Emergency Department Provider Note  ____________________________________________   First MD Initiated Contact with Patient 08/17/19 2039     (approximate)  I have reviewed the triage vital signs and the nursing notes.  History  Chief Complaint Chest Pain    HPI [name] [name] is a 59 y.o. female with hx of HF, DM, HTN who presents to the emergency department for chest pain and shortness of breath.  Patient states her symptoms started this evening after finishing dinner when she was walking to her car.  She developed a central sharp chest pain.  Moderate in severity, no radiation, no alleviating or aggravating components.  This was associated with shortness of breath.  Over about an hour or so, with rest, her symptoms have been progressively improving, and she reports some improvement on arrival to the emergency department, symptoms currently mild in severity.  She has not taken anything for this.  She states she has had intermittent episodes of similar chest pain over the last several days, and has noticed they tend to occur when she is exerting herself.  She reports some exertional shortness of breath which is new for her, as well as some mild, symmetric lower extremity swelling.  She reports compliance with all of her medications.  She denies any history of GERD or reflux. Did have exposure to a nephew a few weeks ago who tested positive for COVID.   Past Medical Hx Past Medical History:  Diagnosis Date  . Anemia   . CHF (congestive heart failure) ([place])   . Chronic hepatitis C without hepatic coma ([place]) 11/09/2016  . Diabetes mellitus   . Fibroids   . 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 Hypertension   . MRSA (methicillin resistant Staphylococcus aureus)   . Trichomonas   . VAGINITIS, BACTERIAL, RECURRENT 08/15/2007   Qualifier: Diagnosis of  By: Radene Ou MD, [place]      Problem List Patient Active Problem  List   Diagnosis Date Noted  . Left ventricular hypertrophy 07/21/2019  . Educated about COVID-19 virus infection 07/21/2019  . Acute on chronic diastolic heart failure ([place])   . Acute CHF (congestive heart failure) ([place]) 06/03/2019  . Hypoxia   . SOB (shortness of breath) 05/09/2019  . Sleep apnea 05/09/2019  . Hyperglycemia 04/09/2019  . Snoring 03/13/2019  . Symptomatic anemia 08/17/2018  . Acute renal failure (ARF) ([place]) 08/17/2018  . Chronic cystitis 08/03/2018  . Diabetic neuropathy ([place]) 06/16/2017  . Non compliance w medication regimen 04/12/2017  . Family history of colon cancer in mother 11/17/2016  . Dyspareunia in female 11/11/2016  . Screen for colon cancer 11/11/2016  . History of ovarian cyst 11/11/2016  . Chronic hepatitis C without hepatic coma ([place]) 11/09/2016  . Hyperlipidemia 10/07/2016  . Type 2 diabetes mellitus ([place]) 01/29/2014  . BREAST PAIN, BILATERAL 02/19/2010  . ANEMIA, IRON DEFICIENCY 10/03/2009  . LEUKOPENIA, MILD 09/19/2009  . CHEST PAIN UNSPECIFIED 09/19/2009  . LIPOMA 06/18/2009  . 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 TINEA PEDIS 05/07/2009  . SKIN LESION 05/07/2009  . COUGH 05/07/2009  . SUBSTANCE ABUSE, MULTIPLE 02/22/2007  . Essential hypertension 02/22/2007    Past Surgical Hx Past Surgical History:  Procedure Laterality Date  . BREAST BIOPSY Left 2018  . CESAREAN SECTION     breech    Medications Prior to Admission medications   Medication Sig Start Date End Date Taking? Authorizing Provider  albuterol (VENTOLIN HFA)  108 (90 Base) MCG/ACT inhaler Inhale 2 puffs into the lungs every 6 (six) hours as needed for wheezing or shortness of breath. 04/23/19   [name], MD  amLODipine (NORVASC) 10 MG tablet Take 1 tablet (10 mg total) by mouth daily. 07/10/19   [name], MD  atorvastatin (LIPITOR) 40 MG tablet Take 1 tablet (40 mg total) by mouth daily. 07/10/19   [name], MD  blood glucose meter kit and  supplies KIT Dispense based on patient and insurance preference. Use up to four times daily as directed. (FOR ICD-9 250.00, 250.01). 04/12/19   Dustin Flock, MD  furosemide (LASIX) 40 MG tablet Take 1 tablet (40 mg total) by mouth 2 (two) times daily. 07/10/19 07/09/20  [name], MD  hydrALAZINE (APRESOLINE) 100 MG tablet Take 1 tablet (100 mg total) by mouth 3 (three) times daily. 07/10/19 07/09/20  [name], MD  insulin aspart (NOVOLOG) 100 UNIT/ML injection Inject 6 Units into the skin 3 (three) times daily before meals.    [provider]  Insulin Glargine (LANTUS SOLOSTAR) 100 UNIT/ML Solostar Pen Inject 65 Units into the skin at bedtime. 07/10/19   [name], MD  labetalol (NORMODYNE) 100 MG tablet Take 1 tablet (100 mg total) by mouth 2 (two) times daily. 05/10/19   Minus Breeding, MD  liraglutide (VICTOZA) 18 MG/3ML SOPN Start 0.4m SQ once a day for 7 days, then increase to 1.215monce a day for 7 days, then increase to 1.8 mg daily thereafter 07/10/19   NeCharlott RakesMD  LYRICA 75 MG capsule Take 1 capsule (75 mg total) by mouth 2 (two) times daily. 06/29/19   NeCharlott RakesMD    Allergies Lisinopril  Family Hx Family History  Problem Relation Age of Onset  . Colon cancer Mother   . Liver disease Sister   . Other Neg Hx   . Breast cancer Neg Hx   . Esophageal cancer Neg Hx   . Rectal cancer Neg Hx     Social Hx Social History   Tobacco Use  . Smoking status: Never Smoker  . Smokeless tobacco: Never Used  Substance Use Topics  . Alcohol use: No  . Drug use: No    Types: Cocaine, Marijuana    Comment: remote h/o cocaine and marijuana use     Review of Systems  Constitutional: Negative for fever, chills. Eyes: Negative for visual changes. ENT: Negative for sore throat. Cardiovascular: + for chest pain. Respiratory: + for shortness of breath. Gastrointestinal: Negative for nausea, vomiting.  Genitourinary: Negative for  dysuria. Musculoskeletal: + for leg swelling. Skin: Negative for rash. Neurological: Negative for headaches.   Physical Exam  Vital Signs: ED Triage Vitals  Enc Vitals Group     BP 08/17/19 2100 (!) 115/44     Pulse Rate 08/17/19 2100 (!) 103     Resp 08/17/19 2130 20     Temp 08/17/19 2136 99.7 F (37.6 C)     Temp Source 08/17/19 2136 Oral     SpO2 08/17/19 2100 97 %     Weight 08/17/19 2028 238 lb (108 kg)     Height 08/17/19 2028 '5\' 4"'  (1.626 m)     Head Circumference --      Peak Flow --      Pain Score 08/17/19 2028 6     Pain Loc --      Pain Edu? --      Excl. in [place]--      Constitutional: Alert  and oriented.  Head: Normocephalic. Atraumatic. Eyes: Conjunctivae clear. Sclera anicteric. Nose: No congestion. No rhinorrhea. Mouth/Throat: Wearing mask.  Neck: No stridor.   Cardiovascular: Normal rate, regular rhythm. Extremities well perfused. Respiratory: Normal respiratory effort.  Lungs CTAB. Gastrointestinal: Soft. Non-tender. Non-distended.  Musculoskeletal: Trace, 1+ symmetric edema to the bilateral lower extremities to the mid shin. Neurologic:  Normal speech and language. No gross focal neurologic deficits are appreciated.  Skin: Skin is warm, dry and intact. No rash noted. Psychiatric: Mood and affect are appropriate for situation.  EKG  Personally reviewed.   Rate: 102 Rhythm: sinus Axis: normal Intervals: WNL TWI III, aVF, new compared to prior No STEMI    Radiology  CXR: IMPRESSION:  No active disease. Borderline to mild cardiomegaly    Procedures  Procedure(s) performed (including critical care):  Procedures   Initial Impression / Assessment and Plan / ED Course  59 y.o. female who presents to the ED for chest pain.  Ddx: ACS, exertional angina, HF exacerbation, GERD, MSK, pleurisy, PTX, COVID, PE  Will obtain labs, imaging, reassess.  Labs reveal acute on chronic kidney disease, creatinine 2.44 from ~1.4 previously - she  does admit to some decreased PO recently, will give small fluid bolus. Troponin x 2 within normal limits, however she does have new inferior T wave inversions on EKG compared to prior, and is moderate risk HEART score.  Given this, discussed observation admission with the patient, who is agreeable.  Discussed with hospitalist for admission.  Awaiting COVID swab and D-dimer.  Would require an NM scan if dimer is elevated due to her CKD.   Final Clinical Impression(s) / ED Diagnosis  Final diagnoses:  Chest pain in adult       Note:  This document was prepared using Dragon voice recognition software and may include unintentional dictation errors.   Lilia Pro., MD 08/18/19 [phone]

## 2019-08-17 NOTE — ED Notes (Signed)
Pt c/o CP SOB. Pt has 1+ pitting edema in bilateral lower extremities and non-pitting peri-orbital swelling is noted. Lungs have rhonchi noted throughout.

## 2019-08-17 NOTE — ED Triage Notes (Signed)
Pt to the er for midsternal chest pain since yesterday and right hip pain. Pt reports SOB.

## 2019-08-18 DIAGNOSIS — Z888 Allergy status to other drugs, medicaments and biological substances status: Secondary | ICD-10-CM | POA: Diagnosis not present

## 2019-08-18 DIAGNOSIS — I10 Essential (primary) hypertension: Secondary | ICD-10-CM | POA: Diagnosis present

## 2019-08-18 DIAGNOSIS — R0600 Dyspnea, unspecified: Secondary | ICD-10-CM

## 2019-08-18 DIAGNOSIS — H109 Unspecified conjunctivitis: Secondary | ICD-10-CM | POA: Diagnosis present

## 2019-08-18 DIAGNOSIS — Z794 Long term (current) use of insulin: Secondary | ICD-10-CM

## 2019-08-18 DIAGNOSIS — F1911 Other psychoactive substance abuse, in remission: Secondary | ICD-10-CM | POA: Diagnosis present

## 2019-08-18 DIAGNOSIS — Z800 Family history of malignant neoplasm of digestive organs: Secondary | ICD-10-CM | POA: Diagnosis not present

## 2019-08-18 DIAGNOSIS — Z6841 Body Mass Index (BMI) 40.0 and over, adult: Secondary | ICD-10-CM | POA: Diagnosis not present

## 2019-08-18 DIAGNOSIS — E785 Hyperlipidemia, unspecified: Secondary | ICD-10-CM | POA: Diagnosis present

## 2019-08-18 DIAGNOSIS — Z20822 Contact with and (suspected) exposure to covid-19: Secondary | ICD-10-CM | POA: Diagnosis not present

## 2019-08-18 DIAGNOSIS — J40 Bronchitis, not specified as acute or chronic: Secondary | ICD-10-CM | POA: Diagnosis present

## 2019-08-18 DIAGNOSIS — Z87440 Personal history of urinary (tract) infections: Secondary | ICD-10-CM | POA: Diagnosis not present

## 2019-08-18 DIAGNOSIS — R072 Precordial pain: Secondary | ICD-10-CM | POA: Diagnosis present

## 2019-08-18 DIAGNOSIS — Z8679 Personal history of other diseases of the circulatory system: Secondary | ICD-10-CM | POA: Diagnosis not present

## 2019-08-18 DIAGNOSIS — R809 Proteinuria, unspecified: Secondary | ICD-10-CM | POA: Diagnosis present

## 2019-08-18 DIAGNOSIS — J440 Chronic obstructive pulmonary disease with acute lower respiratory infection: Secondary | ICD-10-CM | POA: Diagnosis not present

## 2019-08-18 DIAGNOSIS — D509 Iron deficiency anemia, unspecified: Secondary | ICD-10-CM | POA: Diagnosis not present

## 2019-08-18 DIAGNOSIS — J209 Acute bronchitis, unspecified: Secondary | ICD-10-CM | POA: Diagnosis not present

## 2019-08-18 DIAGNOSIS — E1165 Type 2 diabetes mellitus with hyperglycemia: Secondary | ICD-10-CM | POA: Diagnosis present

## 2019-08-18 DIAGNOSIS — E11311 Type 2 diabetes mellitus with unspecified diabetic retinopathy with macular edema: Secondary | ICD-10-CM

## 2019-08-18 DIAGNOSIS — R079 Chest pain, unspecified: Secondary | ICD-10-CM | POA: Diagnosis not present

## 2019-08-18 DIAGNOSIS — R0609 Other forms of dyspnea: Secondary | ICD-10-CM | POA: Diagnosis not present

## 2019-08-18 DIAGNOSIS — B182 Chronic viral hepatitis C: Secondary | ICD-10-CM | POA: Diagnosis present

## 2019-08-18 DIAGNOSIS — G4733 Obstructive sleep apnea (adult) (pediatric): Secondary | ICD-10-CM | POA: Diagnosis present

## 2019-08-18 DIAGNOSIS — N179 Acute kidney failure, unspecified: Secondary | ICD-10-CM | POA: Diagnosis not present

## 2019-08-18 DIAGNOSIS — Z79899 Other long term (current) drug therapy: Secondary | ICD-10-CM | POA: Diagnosis not present

## 2019-08-18 LAB — CBC
HCT: 27.2 % — ABNORMAL LOW (ref 36.0–46.0)
Hemoglobin: 8.3 g/dL — ABNORMAL LOW (ref 12.0–15.0)
MCH: 26.9 pg (ref 26.0–34.0)
MCHC: 30.5 g/dL (ref 30.0–36.0)
MCV: 88.3 fL (ref 80.0–100.0)
Platelets: 172 10*3/uL (ref 150–400)
RBC: 3.08 MIL/uL — ABNORMAL LOW (ref 3.87–5.11)
RDW: 14.1 % (ref 11.5–15.5)
WBC: 4.7 10*3/uL (ref 4.0–10.5)
nRBC: 0 % (ref 0.0–0.2)

## 2019-08-18 LAB — COMPREHENSIVE METABOLIC PANEL
ALT: 10 U/L (ref 0–44)
AST: 12 U/L — ABNORMAL LOW (ref 15–41)
Albumin: 3.7 g/dL (ref 3.5–5.0)
Alkaline Phosphatase: 58 U/L (ref 38–126)
Anion gap: 12 (ref 5–15)
BUN: 56 mg/dL — ABNORMAL HIGH (ref 6–20)
CO2: 18 mmol/L — ABNORMAL LOW (ref 22–32)
Calcium: 7.7 mg/dL — ABNORMAL LOW (ref 8.9–10.3)
Chloride: 112 mmol/L — ABNORMAL HIGH (ref 98–111)
Creatinine, Ser: 2.11 mg/dL — ABNORMAL HIGH (ref 0.44–1.00)
GFR calc Af Amer: 29 mL/min — ABNORMAL LOW (ref >60)
GFR calc non Af Amer: 25 mL/min — ABNORMAL LOW (ref >60)
Glucose, Bld: 111 mg/dL — ABNORMAL HIGH (ref 70–99)
Potassium: 3.9 mmol/L (ref 3.5–5.1)
Sodium: 142 mmol/L (ref 135–145)
Total Bilirubin: 0.5 mg/dL (ref 0.3–1.2)
Total Protein: 7.1 g/dL (ref 6.5–8.1)

## 2019-08-18 LAB — GLUCOSE, CAPILLARY
Glucose-Capillary: 82 mg/dL (ref 70–99)
Glucose-Capillary: 155 mg/dL — ABNORMAL HIGH (ref 70–99)
Glucose-Capillary: 227 mg/dL — ABNORMAL HIGH (ref 70–99)
Glucose-Capillary: 400 mg/dL — ABNORMAL HIGH (ref 70–99)

## 2019-08-18 LAB — HEMOGLOBIN A1C
Hgb A1c MFr Bld: 10 % — ABNORMAL HIGH (ref 4.8–5.6)
Mean Plasma Glucose: 240.3 mg/dL

## 2019-08-18 LAB — RESPIRATORY PANEL BY RT PCR (FLU A&B, COVID)
Influenza A by PCR: NEGATIVE
Influenza B by PCR: NEGATIVE
SARS Coronavirus 2 by RT PCR: NEGATIVE

## 2019-08-18 LAB — TSH: TSH: 1.547 u[IU]/mL (ref 0.350–4.500)

## 2019-08-18 LAB — MAGNESIUM: Magnesium: 1.8 mg/dL (ref 1.7–2.4)

## 2019-08-18 LAB — FIBRIN DERIVATIVES D-DIMER (ARMC ONLY): Fibrin derivatives D-dimer (ARMC): 297 ng/mL (FEU) (ref 0.00–499.00)

## 2019-08-18 LAB — VITAMIN B12: Vitamin B-12: 461 pg/mL (ref 180–914)

## 2019-08-18 LAB — TROPONIN I (HIGH SENSITIVITY): Troponin I (High Sensitivity): 8 ng/L (ref <18)

## 2019-08-18 LAB — PHOSPHORUS: Phosphorus: 6.3 mg/dL — ABNORMAL HIGH (ref 2.5–4.6)

## 2019-08-18 MED ORDER — LORATADINE 10 MG PO TABS
10.0000 mg | ORAL_TABLET | Freq: Every day | ORAL | Status: DC
  Administered 2019-08-18 – 2019-08-19 (×2): 10 mg via ORAL
  Filled 2019-08-18 (×2): qty 1

## 2019-08-18 MED ORDER — ENOXAPARIN SODIUM 40 MG/0.4ML ~~LOC~~ SOLN
30.0000 mg | SUBCUTANEOUS | Status: DC
  Administered 2019-08-18: 30 mg via SUBCUTANEOUS
  Filled 2019-08-18: qty 0.4

## 2019-08-18 MED ORDER — ALBUTEROL SULFATE (2.5 MG/3ML) 0.083% IN NEBU: 2.5000 mg | INHALATION_SOLUTION | Freq: Four times a day (QID) | RESPIRATORY_TRACT | Status: DC | PRN

## 2019-08-18 MED ORDER — LACTATED RINGERS IV SOLN: INTRAVENOUS | Status: DC

## 2019-08-18 MED ORDER — INSULIN ASPART 100 UNIT/ML ~~LOC~~ SOLN
0.0000 [IU] | Freq: Three times a day (TID) | SUBCUTANEOUS | Status: DC
  Administered 2019-08-18: 22:00:00 15 [IU] via SUBCUTANEOUS
  Administered 2019-08-19 (×2): 3 [IU] via SUBCUTANEOUS
  Filled 2019-08-18 (×3): qty 1

## 2019-08-18 MED ORDER — PREDNISONE 20 MG PO TABS
40.0000 mg | ORAL_TABLET | Freq: Every day | ORAL | Status: DC
  Administered 2019-08-18 – 2019-08-19 (×2): 40 mg via ORAL
  Filled 2019-08-18 (×2): qty 2

## 2019-08-18 MED ORDER — IPRATROPIUM BROMIDE 0.06 % NA SOLN
2.0000 | Freq: Three times a day (TID) | NASAL | Status: DC
  Administered 2019-08-18 – 2019-08-19 (×2): 2 via NASAL
  Filled 2019-08-18 (×2): qty 15

## 2019-08-18 MED ORDER — BENZONATATE 100 MG PO CAPS
200.0000 mg | ORAL_CAPSULE | Freq: Three times a day (TID) | ORAL | Status: DC | PRN
  Administered 2019-08-18: 09:00:00 200 mg via ORAL
  Filled 2019-08-18: qty 2

## 2019-08-18 MED ORDER — LABETALOL HCL 100 MG PO TABS
100.0000 mg | ORAL_TABLET | Freq: Two times a day (BID) | ORAL | Status: DC
  Administered 2019-08-18 – 2019-08-19 (×3): 100 mg via ORAL
  Filled 2019-08-18 (×4): qty 1

## 2019-08-18 MED ORDER — SODIUM CHLORIDE 0.9 % IV SOLN: INTRAVENOUS | Status: DC

## 2019-08-18 MED ORDER — PREGABALIN 75 MG PO CAPS
75.0000 mg | ORAL_CAPSULE | Freq: Two times a day (BID) | ORAL | Status: DC
  Administered 2019-08-18 – 2019-08-19 (×3): 75 mg via ORAL
  Filled 2019-08-18 (×3): qty 1

## 2019-08-18 MED ORDER — HYDRALAZINE HCL 50 MG PO TABS
100.0000 mg | ORAL_TABLET | Freq: Three times a day (TID) | ORAL | Status: DC
  Administered 2019-08-18 – 2019-08-19 (×4): 100 mg via ORAL
  Filled 2019-08-18 (×4): qty 2

## 2019-08-18 MED ORDER — INSULIN GLARGINE 100 UNIT/ML ~~LOC~~ SOLN
65.0000 [IU] | Freq: Every day | SUBCUTANEOUS | Status: DC
  Administered 2019-08-18: 65 [IU] via SUBCUTANEOUS
  Filled 2019-08-18 (×2): qty 0.65

## 2019-08-18 MED ORDER — ATORVASTATIN CALCIUM 20 MG PO TABS
40.0000 mg | ORAL_TABLET | Freq: Every day | ORAL | Status: DC
  Administered 2019-08-18: 40 mg via ORAL
  Filled 2019-08-18: qty 2

## 2019-08-18 MED ORDER — SODIUM CHLORIDE 0.9 % IV SOLN
150.0000 mg | INTRAVENOUS | Status: DC
  Administered 2019-08-18: 150 mg via INTRAVENOUS
  Filled 2019-08-18 (×3): qty 7.5

## 2019-08-18 MED ORDER — ENOXAPARIN SODIUM 40 MG/0.4ML ~~LOC~~ SOLN: 40.0000 mg | SUBCUTANEOUS | Status: DC

## 2019-08-18 MED ORDER — ENOXAPARIN SODIUM 40 MG/0.4ML ~~LOC~~ SOLN
40.0000 mg | Freq: Two times a day (BID) | SUBCUTANEOUS | Status: DC
  Administered 2019-08-18 – 2019-08-19 (×2): 40 mg via SUBCUTANEOUS
  Filled 2019-08-18 (×2): qty 0.4

## 2019-08-18 MED ORDER — POLYMYXIN B-TRIMETHOPRIM 10000-0.1 UNIT/ML-% OP SOLN
1.0000 [drp] | OPHTHALMIC | Status: DC
  Administered 2019-08-18 – 2019-08-19 (×7): 1 [drp] via OPHTHALMIC
  Filled 2019-08-18 (×2): qty 10

## 2019-08-18 MED ORDER — INSULIN ASPART 100 UNIT/ML ~~LOC~~ SOLN
0.0000 [IU] | Freq: Three times a day (TID) | SUBCUTANEOUS | Status: DC
  Administered 2019-08-18: 5 [IU] via SUBCUTANEOUS
  Administered 2019-08-18: 3 [IU] via SUBCUTANEOUS
  Filled 2019-08-18 (×2): qty 1

## 2019-08-18 MED ORDER — AMLODIPINE BESYLATE 5 MG PO TABS
10.0000 mg | ORAL_TABLET | Freq: Every day | ORAL | Status: DC
  Administered 2019-08-18 – 2019-08-19 (×2): 10 mg via ORAL
  Filled 2019-08-18 (×2): qty 2

## 2019-08-18 NOTE — TOC Initial Note (Signed)
Transition of Care [facility]) - Initial/Assessment Note    Patient Details  Name: [name] [name] MRN: [id number] Date of Birth: 05-16-1961  Transition of Care [facility]) CM/SW Contact:    Trecia Rogers, LCSW Phone Number: 08/18/2019, 2:50 PM  Clinical Narrative:                   Patient is a 59 year old female who arrived to the hospital due to chest pain and shortness of breath. Patient did have exposure to nephew a few weeks ago who tested positive for COVID.   Patient lives at home in an apartment with a friend. Patient reports that her friend helps her out as much as he can. Patient reports that she wants to move to a new apartment because she cannot continue to go up and down the stairs because she cannot breathe. Patient reports that she would like to find an apartment that is on the lower level that has handicap access.   Patient reports that she does not have any DMEs in the home but would like to have a small oxygen tank. She does not have any home health needs.  Patient's PCP doctor is: Dr. Margarita Rana and she goes to [facility] [address] in [place], [place]. Patient reports that she has no problem obtaining her medications.    At this moment, she does not have any orders for DMEs or home health. Awaiting PT. No needs at this time.   Expected Discharge Plan: Home/Self Care Barriers to Discharge: Continued Medical Work up   Patient Goals and CMS Choice Patient states their goals for this hospitalization and ongoing recovery are:: "i want to get a new apartment and feel better" CMS Medicare.gov Compare Post Acute Care list provided to:: Patient Choice offered to / list presented to : Patient  Expected Discharge Plan and Services Expected Discharge Plan: Home/Self Care In-house Referral: Clinical Social Work   Post Acute Care Choice: Durable Medical Equipment Living arrangements for the past 2 months: Apartment                                       Prior Living Arrangements/Services Living arrangements for the past 2 months: Apartment Lives with:: Roommate Patient language and need for interpreter reviewed:: Yes Do you feel safe going back to the place where you live?: Yes      Need for Family Participation in Patient Care: Yes (Comment)(pt's friend whom she lives with) Care giver support system in place?: Yes (comment)(pt's friend whom she lives with)   Criminal Activity/Legal Involvement Pertinent to Current Situation/Hospitalization: No - Comment as needed  Activities of Daily Living Home Assistive Devices/Equipment: None ADL Screening (condition at time of admission) Patient's cognitive ability adequate to safely complete daily activities?: Yes Is the patient deaf or have difficulty hearing?: No Does the patient have difficulty seeing, even when wearing glasses/contacts?: No Does the patient have difficulty concentrating, remembering, or making decisions?: No Patient able to express need for assistance with ADLs?: Yes Does the patient have difficulty dressing or bathing?: No Independently performs ADLs?: Yes (appropriate for developmental age) Does the patient have difficulty walking or climbing stairs?: No Weakness of Legs: None Weakness of Arms/Hands: None  Permission Sought/Granted                  Emotional Assessment Appearance:: Appears stated age Attitude/Demeanor/Rapport: Unable to Assess Affect (typically  observed): Unable to Assess Orientation: : Oriented to Self, Oriented to Place, Oriented to  Time, Oriented to Situation Alcohol / Substance Use: Not Applicable Psych Involvement: No (comment)  Admission diagnosis:  Dyspnea on exertion [R06.00] Acute kidney failure (HCC) [N17.9] Chest pain [R07.9] Chest pain in adult [R07.9] Patient Active Problem List   Diagnosis Date Noted  . Acute kidney failure ([place]) 08/18/2019  . Left ventricular hypertrophy 07/21/2019  . Educated about COVID-19 virus  infection 07/21/2019  . Acute on chronic diastolic heart failure ([place])   . Acute CHF (congestive heart failure) ([place]) 06/03/2019  . Hypoxia   . Dyspnea on exertion 05/09/2019  . Sleep apnea 05/09/2019  . Hyperglycemia 04/09/2019  . Snoring 03/13/2019  . Symptomatic anemia 08/17/2018  . Acute renal failure (ARF) ([place]) 08/17/2018  . Chronic cystitis 08/03/2018  . Diabetic neuropathy ([place]) 06/16/2017  . Non compliance w medication regimen 04/12/2017  . Family history of colon cancer in mother 11/17/2016  . Dyspareunia in female 11/11/2016  . Screen for colon cancer 11/11/2016  . History of ovarian cyst 11/11/2016  . Chronic hepatitis C without hepatic coma ([place]) 11/09/2016  . Hyperlipidemia 10/07/2016  . Type 2 diabetes mellitus ([place]) 01/29/2014  . BREAST PAIN, BILATERAL 02/19/2010  . ANEMIA, IRON DEFICIENCY 10/03/2009  . LEUKOPENIA, MILD 09/19/2009  . Chest pain 09/19/2009  . LIPOMA 06/18/2009  . 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 TINEA PEDIS 05/07/2009  . SKIN LESION 05/07/2009  . COUGH 05/07/2009  . SUBSTANCE ABUSE, MULTIPLE 02/22/2007  . Essential hypertension 02/22/2007   PCP:  [name], MD Pharmacy:   [facility] [place] - [address] [place] [address] Phone: [phone] Fax: [phone]     Social Determinants of Health (SDOH) Interventions    Readmission Risk Interventions Readmission Risk Prevention Plan 08/18/2019 06/05/2019 04/12/2019  Transportation Screening Complete Complete Complete  PCP or Specialist Appt within 3-5 Days - - (No Data)  Palliative Care Screening - - Not Applicable  Medication Review (RN Care Manager) Complete Complete Complete  PCP or Specialist appointment within 3-5 days of discharge Complete Complete -  [place] or Home Care Consult - Complete -  SW Recovery Care/Counseling Consult Complete Complete -  Palliative Care Screening Not Applicable Not  Applicable -  [place] Not Applicable Not Applicable -  Some recent data might be hidden

## 2019-08-18 NOTE — Progress Notes (Signed)
PHARMACIST - PHYSICIAN COMMUNICATION  CONCERNING:  Enoxaparin (Lovenox) for DVT Prophylaxis    RECOMMENDATION: Patient was prescribed enoxaprin 40mg  q24 hours for VTE prophylaxis.   Filed Weights   08/17/19 2028 08/18/19 0552  Weight: 238 lb (108 kg) 248 lb 14.4 oz (112.9 kg)    Body mass index is 48.61 kg/m.  Estimated Creatinine Clearance: 33.3 mL/min (A) (by C-G formula based on SCr of 2.11 mg/dL (H)).   Based on [place] patient is candidate for enoxaparin 40mg  every 12 hour dosing due to BMI being >40.   DESCRIPTION: Pharmacy has adjusted enoxaparin dose per [facility] policy.  Patient is now receiving enoxaparin 40mg  every 12 hours.   Dallie Piles, PharmD Clinical Pharmacist  08/18/2019 7:35 AM

## 2019-08-18 NOTE — Progress Notes (Addendum)
PROGRESS NOTE    [name] [name]  ZDG:[id number] DOB: June 11, 1961 DOA: 08/17/2019 PCP: [name], MD       Assessment & Plan:   Active Problems:   Essential hypertension   Chest pain   Type 2 diabetes mellitus (HCC)   Chronic hepatitis C without hepatic coma (HCC)   Dyspnea on exertion   Sleep apnea   Acute kidney failure (HCC)   Dyspnea: w/ exertion. Etiology unclear, possibly bronchitis. COVID19, influenza both neg. CXR shows borderline cardiomegaly. Troponins are not elevated. Echo in 06/04/19 shows EF 59-56%, diastolic function is indeterminate. Not requiring supplemental oxygen at this time. Started on bronchodilators, loratadine, ipratropium nasal spray & steroids. Tessalon pearls prn for cough. Encourage incentive spirometry   AKI: baseline Cr 1.4. Cr is trending down today. Continue on IVFs. Avoid nephrotoxic meds  B/l conjunctivitis: continue on polytrim eyedrops  Likely IDA: etiology unclear. Will start iron supplements   DM2: poorly controlled. HbA1c 10.7 last month. Continue on lantus and SSI w/ accuchecks  Morbid obesity: BMI 48.6. Would benefit from weight loss  HTN: will continue on home dose of amlodipine, hydralazine, & labetalol. Continue to hold home dose of lasix secondary to AKI  HLD: will continue on atorvastatin  OSA: not on CPAP at home  HCV: pt doesn't think she was ever been treated. Will need outpatient f/u   DVT prophylaxis: lovenox Code Status: full  Family Communication: called pt's friend, Irene Shipper, but no answer  Disposition Plan: likely in 24 hours if pt's respiratory continues to improve   Consultants:      Procedures:    Antimicrobials:    Subjective: Pt c/o cough and shortness of breath   Objective: Vitals:   08/18/19 0552 08/18/19 0553 08/18/19 0854 08/18/19 1220  BP: 118/62  126/73 107/68  Pulse: 88 87 93 93  Resp: 20   18  Temp: 98.6 F (37 C)   99.6 F (37.6 C)  TempSrc: Oral   Oral  SpO2: 92%  95%  91%  Weight: 112.9 kg     Height: 5' (1.524 m)       Intake/Output Summary (Last 24 hours) at 08/18/2019 1323 Last data filed at 08/18/2019 0600 Gross per 24 hour  Intake 19.96 ml  Output --  Net 19.96 ml   Filed Weights   08/17/19 2028 08/18/19 0552  Weight: 108 kg 112.9 kg    Examination:  General exam: Appears calm but uncomfortable  Respiratory system: diminished breath sounds b/l. No rales Cardiovascular system: S1 & S2 +.. No rubs, gallops or clicks.  Gastrointestinal system: Abdomen is obese, soft and nontender.Normal bowel sounds heard. Central nervous system: Alert and oriented. Moves all 4 extremities Psychiatry: Judgement and insight appear normal. Flat mood and affect.     Data Reviewed: I have personally reviewed following labs and imaging studies  CBC: Recent Labs  Lab 08/17/19 2052 08/18/19 0501  WBC 5.4 4.7  HGB 9.1* 8.3*  HCT 29.0* 27.2*  MCV 86.3 88.3  PLT 201 387   Basic Metabolic Panel: Recent Labs  Lab 08/17/19 2052 08/18/19 0501  NA 140 142  K 4.1 3.9  CL 108 112*  CO2 22 18*  GLUCOSE 155* 111*  BUN 60* 56*  CREATININE 2.44* 2.11*  CALCIUM 8.1* 7.7*  MG  --  1.8  PHOS  --  6.3*   GFR: Estimated Creatinine Clearance: 33.3 mL/min (A) (by C-G formula based on SCr of 2.11 mg/dL (H)). Liver Function Tests: Recent Labs  Lab 08/18/19  0501  AST 12*  ALT 10  ALKPHOS 58  BILITOT 0.5  PROT 7.1  ALBUMIN 3.7   No results for input(s): LIPASE, AMYLASE in the last 168 hours. No results for input(s): AMMONIA in the last 168 hours. Coagulation Profile: No results for input(s): INR, PROTIME in the last 168 hours. Cardiac Enzymes: No results for input(s): CKTOTAL, CKMB, CKMBINDEX, TROPONINI in the last 168 hours. BNP (last 3 results) No results for input(s): PROBNP in the last 8760 hours. HbA1C: Recent Labs    08/18/19 0501  HGBA1C 10.0*   CBG: Recent Labs  Lab 08/18/19 0812 08/18/19 1217  GLUCAP 82 155*   Lipid  Profile: No results for input(s): CHOL, HDL, LDLCALC, TRIG, CHOLHDL, LDLDIRECT in the last 72 hours. Thyroid Function Tests: Recent Labs    08/18/19 0501  TSH 1.547   Anemia Panel: Recent Labs    08/18/19 0501  VITAMINB12 461   Sepsis Labs: No results for input(s): PROCALCITON, LATICACIDVEN in the last 168 hours.  Recent Results (from the past 240 hour(s))  Respiratory Panel by RT PCR (Flu A&B, Covid) - Nasopharyngeal Swab     Status: None   Collection Time: 08/18/19  1:32 AM   Specimen: Nasopharyngeal Swab  Result Value Ref Range Status   SARS Coronavirus 2 by RT PCR NEGATIVE NEGATIVE Final    Comment: (NOTE) SARS-CoV-2 target nucleic acids are NOT DETECTED. The SARS-CoV-2 RNA is generally detectable in upper respiratoy specimens during the acute phase of infection. The lowest concentration of SARS-CoV-2 viral copies this assay can detect is 131 copies/mL. A negative result does not preclude SARS-Cov-2 infection and should not be used as the sole basis for treatment or other patient management decisions. A negative result may occur with  improper specimen collection/handling, submission of specimen other than nasopharyngeal swab, presence of viral mutation(s) within the areas targeted by this assay, and inadequate number of viral copies (<131 copies/mL). A negative result must be combined with clinical observations, patient history, and epidemiological information. The expected result is Negative. Fact Sheet for Patients:  PinkCheek.be Fact Sheet for Healthcare Providers:  GravelBags.it This test is not yet ap proved or cleared by the [place] FDA and  has been authorized for detection and/or diagnosis of SARS-CoV-2 by FDA under an Emergency Use Authorization (EUA). This EUA will remain  in effect (meaning this test can be used) for the duration of the COVID-19 declaration under Section 564(b)(1) of the Act,  21 U.S.C. section 360bbb-3(b)(1), unless the authorization is terminated or revoked sooner.    Influenza A by PCR NEGATIVE NEGATIVE Final   Influenza B by PCR NEGATIVE NEGATIVE Final    Comment: (NOTE) The Xpert Xpress SARS-CoV-2/FLU/RSV assay is intended as an aid in  the diagnosis of influenza from Nasopharyngeal swab specimens and  should not be used as a sole basis for treatment. Nasal washings and  aspirates are unacceptable for Xpert Xpress SARS-CoV-2/FLU/RSV  testing. Fact Sheet for Patients: PinkCheek.be Fact Sheet for Healthcare Providers: GravelBags.it This test is not yet approved or cleared by the [place] FDA and  has been authorized for detection and/or diagnosis of SARS-CoV-2 by  FDA under an Emergency Use Authorization (EUA). This EUA will remain  in effect (meaning this test can be used) for the duration of the  Covid-19 declaration under Section 564(b)(1) of the Act, 21  U.S.C. section 360bbb-3(b)(1), unless the authorization is  terminated or revoked. Performed at [facility], [address] [place], [place] [address]  Radiology Studies: DG Chest Port 1 View  Result Date: 08/17/2019 CLINICAL DATA:  Mid sternal chest pain EXAM: PORTABLE CHEST 1 VIEW COMPARISON:  06/02/2019 FINDINGS: Low lung volumes. Borderline to mild cardiomegaly. No focal airspace disease, pleural effusion or pneumothorax. IMPRESSION: No active disease.  Borderline to mild cardiomegaly Electronically Signed   By: Donavan Foil M.D.   On: 08/17/2019 20:50        Scheduled Meds: . amLODipine  10 mg Oral Daily  . atorvastatin  40 mg Oral Daily  . enoxaparin (LOVENOX) injection  40 mg Subcutaneous Q12H  . hydrALAZINE  100 mg Oral TID  . insulin aspart  0-15 Units Subcutaneous TID WC  . insulin glargine  65 Units Subcutaneous QHS  . ipratropium  2 spray Each Nare TID  . labetalol  100 mg Oral BID  .  loratadine  10 mg Oral Daily  . predniSONE  40 mg Oral Q breakfast  . pregabalin  75 mg Oral BID  . trimethoprim-polymyxin b  1 drop Both Eyes Q4H   Continuous Infusions: . sodium chloride 125 mL/hr at 08/18/19 0600     LOS: 0 days    Time spent: 31 mins     Wyvonnia Dusky, MD [facility] Pager [phone]  If 7PM-7AM, please contact night-coverage [email or web address] 08/18/2019, 1:23 PM

## 2019-08-18 NOTE — Progress Notes (Signed)
59 year old CC dyspnea on exertion and chest pain. PMH: HCV, IV drug use, HTN IDDM, morbid obesity and OSA  RN reports covid screen negative and admit orders changed to reflect.   Also noted workup thus far negative into etiology of chest pain.  IVF ordered in light of AKI  present Also given comorbidities she as at risk for hypothyroidism and nutritional deficiencies, therefore some screening labs ordered.   She has had history of uti and significant microalbuminuria.  Urinalysis ordered.

## 2019-08-18 NOTE — H&P (Signed)
History and Physical    [name] [name] ERX:[id number] DOB: 09-20-1960 DOA: 08/17/2019  PCP: [name], MD  Patient coming from: home   Chief Complaint: dyspnea on exertion, chest pain  HPI: [name] [name] is a 59 y.o. female with medical history significant for morbid obesity, history IV drug use now sober, hcv chronic, insulin-dependent diabetes, osa not on cpap, who presents with above.  Exposed to nephew who has covid on 1/9.  For the past 3 days mainly bothered by shortness of breath. This mainly happens when exerting self. It is not present when resting. No cough or fever.  She also has substernal chest pain that does not radiate, brief in nature, sharp, also brought on by exertion. Normal TTE 05/2019.  She also reports loose stools several times a day for the past few days. NO blood or mucous. No vomiting. Is tolerating fluids and says urine is light yellow. Denies abdominal pain.  Says at least several weeks tearing of eyes and crusting. Eyes feel irritated, no pain, no vision change.  Denies history venous thrombus. No recent immobilization. Has been diagnosed w/ osa but hasn't yet started cpap.  ED Course: aspirin, labs  Review of Systems: As per HPI otherwise 10 point review of systems negative.    Past Medical History:  Diagnosis Date  . Anemia   . CHF (congestive heart failure) ([place])   . Chronic hepatitis C without hepatic coma ([place]) 11/09/2016  . Diabetes mellitus   . Fibroids   . 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 Hypertension   . MRSA (methicillin resistant Staphylococcus aureus)   . Trichomonas   . VAGINITIS, BACTERIAL, RECURRENT 08/15/2007   Qualifier: Diagnosis of  By: Radene Ou MD, [place]      Past Surgical History:  Procedure Laterality Date  . BREAST BIOPSY Left 2018  . CESAREAN SECTION     breech     reports that she has never smoked. She has never used smokeless tobacco. She reports that she does not  drink alcohol or use drugs.  Allergies  Allergen Reactions  . Lisinopril Swelling    Family History  Problem Relation Age of Onset  . Colon cancer Mother   . Liver disease Sister   . Other Neg Hx   . Breast cancer Neg Hx   . Esophageal cancer Neg Hx   . Rectal cancer Neg Hx     Prior to Admission medications   Medication Sig Start Date End Date Taking? Authorizing Provider  albuterol (VENTOLIN HFA) 108 (90 Base) MCG/ACT inhaler Inhale 2 puffs into the lungs every 6 (six) hours as needed for wheezing or shortness of breath. 04/23/19   [name], MD  amLODipine (NORVASC) 10 MG tablet Take 1 tablet (10 mg total) by mouth daily. 07/10/19   [name], MD  atorvastatin (LIPITOR) 40 MG tablet Take 1 tablet (40 mg total) by mouth daily. 07/10/19   [name], MD  blood glucose meter kit and supplies KIT Dispense based on patient and insurance preference. Use up to four times daily as directed. (FOR ICD-9 250.00, 250.01). 04/12/19   Dustin Flock, MD  furosemide (LASIX) 40 MG tablet Take 1 tablet (40 mg total) by mouth 2 (two) times daily. 07/10/19 07/09/20  [name], MD  hydrALAZINE (APRESOLINE) 100 MG tablet Take 1 tablet (100 mg total) by mouth 3 (three) times daily. 07/10/19 07/09/20  [name], MD  Insulin Glargine (LANTUS SOLOSTAR) 100 UNIT/ML Solostar Pen  Inject 65 Units into the skin at bedtime. 07/10/19   [name], MD  labetalol (NORMODYNE) 100 MG tablet Take 1 tablet (100 mg total) by mouth 2 (two) times daily. 05/10/19   Minus Breeding, MD  liraglutide (VICTOZA) 18 MG/3ML SOPN Start 0.83m SQ once a day for 7 days, then increase to 1.265monce a day for 7 days, then increase to 1.8 mg daily thereafter 07/10/19   NeCharlott RakesMD  LYRICA 75 MG capsule Take 1 capsule (75 mg total) by mouth 2 (two) times daily. 06/29/19   NeCharlott RakesMD    Physical Exam: Vitals:   08/17/19 2200 08/17/19 2230 08/17/19 2246 08/17/19 2248  BP:      Pulse: (!)  103 (!) 102 (!) 102 99  Resp: '18 18 19 15  ' Temp:      TempSrc:      SpO2: 92% 95% 94% 96%  Weight:      Height:        Constitutional: No acute distress Head: Atraumatic Eyes: Conjunctiva mildly injected. Mucous discharge b/l left greater than right ENM: Moist mucous membranes. Normal dentition.  Neck: Supple Respiratory: Clear to auscultation bilaterally, no wheezing/rales/rhonchi. Normal respiratory effort. No accessory muscle use. . Cardiovascular: Regular rate and rhythm. No murmurs/rubs/gallops. Abdomen: Non-tender, obese. No masses. No rebound or guarding. Positive bowel sounds. Musculoskeletal: No joint deformity upper and lower extremities. Normal ROM, no contractures. Normal muscle tone.  Skin: No rashes, lesions, or ulcers.  Extremities: mod LE pitting edema. Palpable peripheral pulses. Neurologic: Alert, moving all 4 extremities. Psychiatric: Normal insight and judgement.   Labs on Admission: I have personally reviewed following labs and imaging studies  CBC: Recent Labs  Lab 08/17/19 2052  WBC 5.4  HGB 9.1*  HCT 29.0*  MCV 86.3  PLT 20923 Basic Metabolic Panel: Recent Labs  Lab 08/17/19 2052  NA 140  K 4.1  CL 108  CO2 22  GLUCOSE 155*  BUN 60*  CREATININE 2.44*  CALCIUM 8.1*   GFR: Estimated Creatinine Clearance: 30.2 mL/min (A) (by C-G formula based on SCr of 2.44 mg/dL (H)). Liver Function Tests: No results for input(s): AST, ALT, ALKPHOS, BILITOT, PROT, ALBUMIN in the last 168 hours. No results for input(s): LIPASE, AMYLASE in the last 168 hours. No results for input(s): AMMONIA in the last 168 hours. Coagulation Profile: No results for input(s): INR, PROTIME in the last 168 hours. Cardiac Enzymes: No results for input(s): CKTOTAL, CKMB, CKMBINDEX, TROPONINI in the last 168 hours. BNP (last 3 results) No results for input(s): PROBNP in the last 8760 hours. HbA1C: No results for input(s): HGBA1C in the last 72 hours. CBG: No results for  input(s): GLUCAP in the last 168 hours. Lipid Profile: No results for input(s): CHOL, HDL, LDLCALC, TRIG, CHOLHDL, LDLDIRECT in the last 72 hours. Thyroid Function Tests: No results for input(s): TSH, T4TOTAL, FREET4, T3FREE, THYROIDAB in the last 72 hours. Anemia Panel: No results for input(s): VITAMINB12, FOLATE, FERRITIN, TIBC, IRON, RETICCTPCT in the last 72 hours. Urine analysis:    Component Value Date/Time   COLORURINE STRAW (A) 04/09/2019 1615   APPEARANCEUR CLOUDY (A) 04/09/2019 1615   LABSPEC 1.010 04/09/2019 1615   PHURINE 5.0 04/09/2019 1615   GLUCOSEU >=500 (A) 04/09/2019 1615   HGBUR SMALL (A) 04/09/2019 1615   HGBUR negative 06/29/2010 1356   BILIRUBINUR NEGATIVE 04/09/2019 1615   KETONESUR NEGATIVE 04/09/2019 1615   PROTEINUR 100 (A) 04/09/2019 1615   UROBILINOGEN 1.0 01/26/2015 0925   NITRITE  NEGATIVE 04/09/2019 1615   LEUKOCYTESUR SMALL (A) 04/09/2019 1615    Radiological Exams on Admission: DG Chest Port 1 View  Result Date: 08/17/2019 CLINICAL DATA:  Mid sternal chest pain EXAM: PORTABLE CHEST 1 VIEW COMPARISON:  06/02/2019 FINDINGS: Low lung volumes. Borderline to mild cardiomegaly. No focal airspace disease, pleural effusion or pneumothorax. IMPRESSION: No active disease.  Borderline to mild cardiomegaly Electronically Signed   By: Donavan Foil M.D.   On: 08/17/2019 20:50    EKG: Independently reviewed. twi III, r, f  Assessment/Plan Active Problems:   Essential hypertension   Chest pain   Type 2 diabetes mellitus (HCC)   Chronic hepatitis C without hepatic coma (HCC)   Dyspnea on exertion   Sleep apnea   # Exertional chest pain # Dyspnea on exertion - ddx includes ACS, PE, covid. TWIs seen on EKG. Troponins normal and stable (3>4). CXR unremarkable save for borderline cardiomegaly. Unlikely chf given normal bnp. LE exam not strongly suggestive of DVT. Not hypoxic on room air - wasn't ambulated in ED. Received aspirin. - f/u covid test - telemetry,  am EKG, AM troponin. Holding on heparin given normal troponins, low threshold to start if up-trending - f/u d dimer. Given aki on ckd if elevated would consider vq scan vs LE dopplers as initial imaging study  # Acute kidney injury - cr 2.44, bun elevated, baseline cr 1.4. In setting of recent diarrhea (no red flag diarrhea symptoms). - NS @ 125, po ad lib - am cmp  # Bilateral conjunctivitis - no red flag symptoms. Duration of at least 3 wks, somewhat atypical for viral process. - start polytrim eyedrops  # Anemia - h 9.1, stable from prior. W/u last year suggestive of iron deficiency and chronic disease. Unremarkable upper and lower endoscopy last year - ctm  # DM - poorly controlled, a1c 10.7 last month - cont home lantus 65 units nightly - hold home victoza, start SSI  # HTN - cont home amlodipine, atorvastatin, hydralazine, and labetalol - hold home lasix given aki  # OSA - not on cpap at home - monitor  # HCV - pt doesn't think has ever been treated. lfts and plts wnl - outpt f/u  DVT prophylaxis: lovenox Code Status: full  Family Communication: friend larry  Disposition Plan: tbd  Consults called: none  Admission status: med/surg    Desma Maxim MD [facility] Pager [phone]  If 7PM-7AM, please contact night-coverage [email or web address] Password [email or web address]  08/18/2019, 12:21 AM

## 2019-08-18 NOTE — Progress Notes (Signed)
PT Cancellation Note  Patient Details Name: [name] [name] MRN: [id number] DOB: 1961-07-15   Cancelled Treatment:    Reason Eval/Treat Not Completed: Patient not medically ready. Patient is getting blood.    [address], [place], [place] DPT 08/18/2019, 3:27 PM

## 2019-08-19 DIAGNOSIS — J449 Chronic obstructive pulmonary disease, unspecified: Secondary | ICD-10-CM

## 2019-08-19 DIAGNOSIS — D509 Iron deficiency anemia, unspecified: Secondary | ICD-10-CM

## 2019-08-19 DIAGNOSIS — J209 Acute bronchitis, unspecified: Secondary | ICD-10-CM

## 2019-08-19 DIAGNOSIS — J440 Chronic obstructive pulmonary disease with acute lower respiratory infection: Secondary | ICD-10-CM

## 2019-08-19 LAB — CBC
HCT: 26.6 % — ABNORMAL LOW (ref 36.0–46.0)
Hemoglobin: 8.2 g/dL — ABNORMAL LOW (ref 12.0–15.0)
MCH: 26.8 pg (ref 26.0–34.0)
MCHC: 30.8 g/dL (ref 30.0–36.0)
MCV: 86.9 fL (ref 80.0–100.0)
Platelets: 168 10*3/uL (ref 150–400)
RBC: 3.06 MIL/uL — ABNORMAL LOW (ref 3.87–5.11)
RDW: 13.7 % (ref 11.5–15.5)
WBC: 4.8 10*3/uL (ref 4.0–10.5)
nRBC: 0 % (ref 0.0–0.2)

## 2019-08-19 LAB — BASIC METABOLIC PANEL
Anion gap: 4 — ABNORMAL LOW (ref 5–15)
BUN: 54 mg/dL — ABNORMAL HIGH (ref 6–20)
CO2: 23 mmol/L (ref 22–32)
Calcium: 7.6 mg/dL — ABNORMAL LOW (ref 8.9–10.3)
Chloride: 110 mmol/L (ref 98–111)
Creatinine, Ser: 1.83 mg/dL — ABNORMAL HIGH (ref 0.44–1.00)
GFR calc Af Amer: 35 mL/min — ABNORMAL LOW (ref >60)
GFR calc non Af Amer: 30 mL/min — ABNORMAL LOW (ref >60)
Glucose, Bld: 235 mg/dL — ABNORMAL HIGH (ref 70–99)
Potassium: 4.1 mmol/L (ref 3.5–5.1)
Sodium: 137 mmol/L (ref 135–145)

## 2019-08-19 LAB — GLUCOSE, CAPILLARY
Glucose-Capillary: 168 mg/dL — ABNORMAL HIGH (ref 70–99)
Glucose-Capillary: 182 mg/dL — ABNORMAL HIGH (ref 70–99)

## 2019-08-19 MED ORDER — PREDNISONE 20 MG PO TABS: 40.0000 mg | ORAL_TABLET | Freq: Every day | ORAL | 0 refills | Status: AC

## 2019-08-19 MED ORDER — FERROUS SULFATE 325 (65 FE) MG PO TABS: 325.0000 mg | ORAL_TABLET | Freq: Two times a day (BID) | ORAL | 0 refills | Status: DC

## 2019-08-19 MED ORDER — BENZONATATE 200 MG PO CAPS: 200.0000 mg | ORAL_CAPSULE | Freq: Three times a day (TID) | ORAL | 0 refills | Status: AC | PRN

## 2019-08-19 MED ORDER — LORATADINE 10 MG PO TABS: 10.0000 mg | ORAL_TABLET | Freq: Every day | ORAL | 0 refills | Status: DC

## 2019-08-19 MED ORDER — IPRATROPIUM BROMIDE 0.06 % NA SOLN: 2.0000 | Freq: Three times a day (TID) | NASAL | 0 refills | Status: DC

## 2019-08-19 NOTE — Progress Notes (Signed)
Physical Therapy Evaluation Patient Details Name: [name] [name] MRN: [id number] DOB: 05-02-1961 Today's Date: 08/19/2019   History of Present Illness  per MD noteLHPI: [name] [name] is a 59 y.o. female with medical history significant for morbid obesity, history IV drug use now sober, hcv chronic, insulin-dependent diabetes, osa not on cpap,with dyspnea and chest pain.  Clinical Impression  Patient agrees to PT evql.. She is independent with bed mobility, transfers and ambulation without AD 300 feet. She has no balance impairments in sitting or standing and strength is WFL. She is at basline of prior function and will be DC for PT at this time as she has no skilled PT needs at this time.     Follow Up Recommendations No PT follow up    Equipment Recommendations  None recommended by PT    Recommendations for Other Services       Precautions / Restrictions Precautions Precautions: None Restrictions Weight Bearing Restrictions: No      Mobility  Bed Mobility Overal bed mobility: Independent                Transfers Overall transfer level: Independent Equipment used: None                Ambulation/Gait Ambulation/Gait assistance: Independent Gait Distance (Feet): 300 Feet Assistive device: None Gait Pattern/deviations: Step-through pattern        Stairs            Wheelchair Mobility    Modified Rankin (Stroke Patients Only)       Balance Overall balance assessment: Independent                                           Pertinent Vitals/Pain Pain Assessment: No/denies pain    Home Living Family/patient expects to be discharged to:: Private residence Living Arrangements: Spouse/significant other Available Help at Discharge: Friend(s) Type of Home: House Home Access: Stairs to enter Entrance Stairs-Rails: None Technical brewer of Steps: 2 Home Layout: Two level        Prior Function Level of Independence:  Independent               Hand Dominance        Extremity/Trunk Assessment   Upper Extremity Assessment Upper Extremity Assessment: Overall WFL for tasks assessed    Lower Extremity Assessment Lower Extremity Assessment: Overall WFL for tasks assessed       Communication   Communication: No difficulties  Cognition Arousal/Alertness: Awake/alert Behavior During Therapy: WFL for tasks assessed/performed Overall Cognitive Status: Within Functional Limits for tasks assessed                                        General Comments      Exercises     Assessment/Plan    PT Assessment Patent does not need any further PT services  PT Problem List         PT Treatment Interventions      PT Goals (Current goals can be found in the Care Plan section)  Acute Rehab PT Goals Patient Stated Goal: to walk PT Goal Formulation: All assessment and education complete, DC therapy    Frequency     Barriers to discharge        Co-evaluation  AM-PAC PT "6 Clicks" Mobility  Outcome Measure Help needed turning from your back to your side while in a flat bed without using bedrails?: None Help needed moving from lying on your back to sitting on the side of a flat bed without using bedrails?: None Help needed moving to and from a bed to a chair (including a wheelchair)?: None Help needed standing up from a chair using your arms (e.g., wheelchair or bedside chair)?: None Help needed to walk in hospital room?: None Help needed climbing 3-5 steps with a railing? : None 6 Click Score: 24    End of Session Equipment Utilized During Treatment: Gait belt Activity Tolerance: Patient limited by fatigue Patient left: in bed Nurse Communication: Mobility status PT Visit Diagnosis: Difficulty in walking, not elsewhere classified (R26.2)    Time: 6144-3154 PT Time Calculation (min) (ACUTE ONLY): 30 min   Charges:   PT Evaluation $PT Eval Low  Complexity: 1 Low PT Treatments $Gait Training: 8-22 mins          [place], [place] S. PT DPT 08/19/2019, 9:33 AM

## 2019-08-19 NOTE — Progress Notes (Signed)
Patient discharged to home with family. Discharge instructions given to patient who verbalized understanding.Patient Verbalized no needs or issues. PIV d/c with tip intact. Transported by w/c to main entrance for d/c with husband.

## 2019-08-19 NOTE — Discharge Summary (Signed)
Physician Discharge Summary  [name] [name] FHL:[id number] DOB: [birth year] DOA: 08/17/2019  PCP: [name], MD  Admit date: 08/17/2019 Discharge date: 08/19/2019  Admitted From: home Disposition: home  Recommendations for Outpatient Follow-up:  1. Follow up with PCP in 1-2 weeks   Home Health: no  Equipment/Devices: n/a  Discharge Condition: stable CODE STATUS: full Diet recommendation: Heart Healthy / Carb Modified   Brief/Interim Summary: HPI was taken from Dr. Si Raider [name] Fretz is a 59 y.o. female with medical history significant for morbid obesity, history IV drug use now sober, hcv chronic, insulin-dependent diabetes, osa not on cpap, who presents with above.  Exposed to nephew who has covid on 1/9.  For the past 3 days mainly bothered by shortness of breath. This mainly happens when exerting self. It is not present when resting. No cough or fever.  She also has substernal chest pain that does not radiate, brief in nature, sharp, also brought on by exertion. Normal TTE 05/2019.  She also reports loose stools several times a day for the past few days. NO blood or mucous. No vomiting. Is tolerating fluids and says urine is light yellow. Denies abdominal pain.  Says at least several weeks tearing of eyes and crusting. Eyes feel irritated, no pain, no vision change.  Denies history venous thrombus. No recent immobilization. Has been diagnosed w/ osa but hasn't yet started cpap.  Hospital Course from Dr. Lenise Herald 1/30-1/31/21: Pt was dyspnea possibly secondary to bronchitis. COVID19, influenza were both neg. CXR showed borderline cardiomegaly. Troponins were not elevated and echo on 06/04/19 showed EF 37-34%, diastolic function was indeterminate. Pt did not require supplemental oxygen during her hospital stay. Pt was treated w/ tessalon pearls, loratadine, ipratropium nasal spray, bronchodilators & steroids. Pt responded well to treatment. Of note, pt was found to  have AKI and Cr continued to trend down daily. Also, pt was found to have iron deficiency anemia of unknown etiology and was started on iron supplements. Pt was told that iron supplements can cause constipation and black stools. Pt verbalized her understanding    Discharge Diagnoses:  Active Problems:   Essential hypertension   Chest pain   Type 2 diabetes mellitus (HCC)   Chronic hepatitis C without hepatic coma (HCC)   Dyspnea on exertion   Sleep apnea   Acute kidney failure (HCC)  Dyspnea: w/ exertion. Etiology unclear, possibly bronchitis. COVID19, influenza both neg. CXR shows borderline cardiomegaly. Troponins are not elevated. Echo in 06/04/19 shows EF 28-76%, diastolic function is indeterminate. Not requiring supplemental oxygen at this time. Started on bronchodilators, loratadine, ipratropium nasal spray & steroids. Tessalon pearls prn for cough. Encourage incentive spirometry   AKI: baseline Cr 1.4. Cr is trending down again today. Continue on IVFs. Avoid nephrotoxic meds  B/l conjunctivitis: continue on polytrim eyedrops  Likely IDA: etiology unclear. Will start iron supplements. Will need an outpatient colonoscopy.  DM2: poorly controlled. HbA1c 10.7 last month. Continue on lantus and SSI w/ accuchecks  Morbid obesity: BMI 48.6. Would benefit from weight loss  HTN: will continue on home dose of amlodipine, hydralazine, & labetalol. Continue to hold home dose of lasix secondary to AKI  HLD: will continue on atorvastatin  OSA: not on CPAP at home  HCV: pt doesn't think she was ever been treated. Will need outpatient f/u   Discharge Instructions  Discharge Instructions    Diet - low sodium heart healthy   Complete by: As directed    Diet Carb Modified   Complete by:  As directed    Discharge instructions   Complete by: As directed    F/u PCP in 1-2 weeks   Increase activity slowly   Complete by: As directed      Allergies as of 08/19/2019      Reactions    Lisinopril Swelling      Medication List    TAKE these medications   albuterol 108 (90 Base) MCG/ACT inhaler Commonly known as: VENTOLIN HFA Inhale 2 puffs into the lungs every 6 (six) hours as needed for wheezing or shortness of breath.   amLODipine 10 MG tablet Commonly known as: NORVASC Take 1 tablet (10 mg total) by mouth daily.   atorvastatin 40 MG tablet Commonly known as: LIPITOR Take 1 tablet (40 mg total) by mouth daily.   benzonatate 200 MG capsule Commonly known as: TESSALON Take 1 capsule (200 mg total) by mouth 3 (three) times daily as needed for up to 7 days for cough.   blood glucose meter kit and supplies Kit Dispense based on patient and insurance preference. Use up to four times daily as directed. (FOR ICD-9 250.00, 250.01).   ferrous sulfate 325 (65 FE) MG tablet Take 1 tablet (325 mg total) by mouth 2 (two) times daily with a meal.   furosemide 40 MG tablet Commonly known as: LASIX Take 1 tablet (40 mg total) by mouth 2 (two) times daily.   hydrALAZINE 100 MG tablet Commonly known as: APRESOLINE Take 1 tablet (100 mg total) by mouth 3 (three) times daily.   ipratropium 0.06 % nasal spray Commonly known as: ATROVENT Place 2 sprays into both nostrils 3 (three) times daily.   labetalol 100 MG tablet Commonly known as: NORMODYNE Take 1 tablet (100 mg total) by mouth 2 (two) times daily.   Lantus SoloStar 100 UNIT/ML Solostar Pen Generic drug: Insulin Glargine Inject 65 Units into the skin at bedtime.   lisinopril 20 MG tablet Commonly known as: ZESTRIL Take 20 mg by mouth daily.   loratadine 10 MG tablet Commonly known as: CLARITIN Take 1 tablet (10 mg total) by mouth daily. Start taking on: August 20, 2019   Lyrica 75 MG capsule Generic drug: pregabalin Take 1 capsule (75 mg total) by mouth 2 (two) times daily.   predniSONE 20 MG tablet Commonly known as: DELTASONE Take 2 tablets (40 mg total) by mouth daily with breakfast for 4  days. Start taking on: August 20, 2019   Victoza 18 MG/3ML Sopn Generic drug: liraglutide Start 0.30m SQ once a day for 7 days, then increase to 1.238monce a day for 7 days, then increase to 1.8 mg daily thereafter      Follow-up Information    Schedule an appointment as soon as possible for a visit  with NeCharlott RakesMD.   Specialty: Family Medicine Why: for follow up, to review your ER visit Contact information: [address] [place] [phone]        Allergies  Allergen Reactions  . Lisinopril Swelling    Consultations:     Procedures/Studies: DG Chest Port 1 View  Result Date: 08/17/2019 CLINICAL DATA:  Mid sternal chest pain EXAM: PORTABLE CHEST 1 VIEW COMPARISON:  06/02/2019 FINDINGS: Low lung volumes. Borderline to mild cardiomegaly. No focal airspace disease, pleural effusion or pneumothorax. IMPRESSION: No active disease.  Borderline to mild cardiomegaly Electronically Signed   By: KiDonavan Foil.D.   On: 08/17/2019 20:50       Subjective: Pt c/o fatigue  Discharge Exam: Vitals:   08/18/19 1948 08/19/19 0427  BP: 122/62 (!) 143/77  Pulse: 98 93  Resp: 20 20  Temp: 98 F (36.7 C) 97.9 F (36.6 C)  SpO2: 93% 97%   Vitals:   08/18/19 1220 08/18/19 1715 08/18/19 1948 08/19/19 0427  BP: 107/68 126/76 122/62 (!) 143/77  Pulse: 93  98 93  Resp: _0 Temp: 99.6 F (37.6 C)  98 F (36.7 C) 97.9 F (36.6 C)  TempSrc: Oral  Oral Oral  SpO2: 91%  93% 97%  Weight:      Height:        General: Pt is alert, awake, not in acute distress Cardiovascular:  S1/S2 +, no rubs, no gallops Respiratory: decreased breath sounds b/l, no rales or rhonchi Abdominal: Soft, NT, obese, bowel sounds + Extremities:  no cyanosis    The results of significant diagnostics from this hospitalization (including imaging, microbiology, ancillary and laboratory) are listed below for reference.     Microbiology: Recent Results (from  the past 240 hour(s))  Respiratory Panel by RT PCR (Flu A&B, Covid) - Nasopharyngeal Swab     Status: None   Collection Time: 08/18/19  1:32 AM   Specimen: Nasopharyngeal Swab  Result Value Ref Range Status   SARS Coronavirus 2 by RT PCR NEGATIVE NEGATIVE Final    Comment: (NOTE) SARS-CoV-2 target nucleic acids are NOT DETECTED. The SARS-CoV-2 RNA is generally detectable in upper respiratoy specimens during the acute phase of infection. The lowest concentration of SARS-CoV-2 viral copies this assay can detect is 131 copies/mL. A negative result does not preclude SARS-Cov-2 infection and should not be used as the sole basis for treatment or other patient management decisions. A negative result may occur with  improper specimen collection/handling, submission of specimen other than nasopharyngeal swab, presence of viral mutation(s) within the areas targeted by this assay, and inadequate number of viral copies (<131 copies/mL). A negative result must be combined with clinical observations, patient history, and epidemiological information. The expected result is Negative. Fact Sheet for Patients:  PinkCheek.be Fact Sheet for Healthcare Providers:  GravelBags.it This test is not yet ap proved or cleared by the [place] FDA and  has been authorized for detection and/or diagnosis of SARS-CoV-2 by FDA under an Emergency Use Authorization (EUA). This EUA will remain  in effect (meaning this test can be used) for the duration of the COVID-19 declaration under Section 564(b)(1) of the Act, 21 U.S.C. section 360bbb-3(b)(1), unless the authorization is terminated or revoked sooner.    Influenza A by PCR NEGATIVE NEGATIVE Final   Influenza B by PCR NEGATIVE NEGATIVE Final    Comment: (NOTE) The Xpert Xpress SARS-CoV-2/FLU/RSV assay is intended as an aid in  the diagnosis of influenza from Nasopharyngeal swab specimens and  should  not be used as a sole basis for treatment. Nasal washings and  aspirates are unacceptable for Xpert Xpress SARS-CoV-2/FLU/RSV  testing. Fact Sheet for Patients: PinkCheek.be Fact Sheet for Healthcare Providers: GravelBags.it This test is not yet approved or cleared by the [place] FDA and  has been authorized for detection and/or diagnosis of SARS-CoV-2 by  FDA under an Emergency Use Authorization (EUA). This EUA will remain  in effect (meaning this test can be used) for the duration of the  Covid-19 declaration under Section 564(b)(1) of the Act, 21  U.S.C. section 360bbb-3(b)(1), unless the authorization is  terminated or revoked. Performed at [facility],  [place], [place]  [address]      Labs: BNP (last 3 results) Recent Labs    01/29/19 1118 06/02/19 1919 08/17/19 2055  BNP 7.9 117.0* 05.3   Basic Metabolic Panel: Recent Labs  Lab 08/17/19 2052 08/18/19 0501 08/19/19 0503  NA 140 142 137  K 4.1 3.9 4.1  CL 108 112* 110  CO2 22 18* 23  GLUCOSE 155* 111* 235*  BUN 60* 56* 54*  CREATININE 2.44* 2.11* 1.83*  CALCIUM 8.1* 7.7* 7.6*  MG  --  1.8  --   PHOS  --  6.3*  --    Liver Function Tests: Recent Labs  Lab 08/18/19 0501  AST 12*  ALT 10  ALKPHOS 58  BILITOT 0.5  PROT 7.1  ALBUMIN 3.7   No results for input(s): LIPASE, AMYLASE in the last 168 hours. No results for input(s): AMMONIA in the last 168 hours. CBC: Recent Labs  Lab 08/17/19 2052 08/18/19 0501 08/19/19 0503  WBC 5.4 4.7 4.8  HGB 9.1* 8.3* 8.2*  HCT 29.0* 27.2* 26.6*  MCV 86.3 88.3 86.9  PLT 201 172 168   Cardiac Enzymes: No results for input(s): CKTOTAL, CKMB, CKMBINDEX, TROPONINI in the last 168 hours. BNP: Invalid input(s): POCBNP CBG: Recent Labs  Lab 08/18/19 0812 08/18/19 1217 08/18/19 1647 08/18/19 2133 08/19/19 0746  GLUCAP 82 155* 227* 400* 168*   D-Dimer No results for  input(s): DDIMER in the last 72 hours. Hgb A1c Recent Labs    08/18/19 0501  HGBA1C 10.0*   Lipid Profile No results for input(s): CHOL, HDL, LDLCALC, TRIG, CHOLHDL, LDLDIRECT in the last 72 hours. Thyroid function studies Recent Labs    08/18/19 0501  TSH 1.547   Anemia work up Recent Labs    08/18/19 0501  VITAMINB12 461   Urinalysis    Component Value Date/Time   COLORURINE STRAW (A) 04/09/2019 1615   APPEARANCEUR CLOUDY (A) 04/09/2019 1615   LABSPEC 1.010 04/09/2019 1615   PHURINE 5.0 04/09/2019 1615   GLUCOSEU >=500 (A) 04/09/2019 1615   HGBUR SMALL (A) 04/09/2019 1615   HGBUR negative 06/29/2010 1356   BILIRUBINUR NEGATIVE 04/09/2019 1615   KETONESUR NEGATIVE 04/09/2019 1615   PROTEINUR 100 (A) 04/09/2019 1615   UROBILINOGEN 1.0 01/26/2015 0925   NITRITE NEGATIVE 04/09/2019 1615   LEUKOCYTESUR SMALL (A) 04/09/2019 1615   Sepsis Labs Invalid input(s): PROCALCITONIN,  WBC,  LACTICIDVEN Microbiology Recent Results (from the past 240 hour(s))  Respiratory Panel by RT PCR (Flu A&B, Covid) - Nasopharyngeal Swab     Status: None   Collection Time: 08/18/19  1:32 AM   Specimen: Nasopharyngeal Swab  Result Value Ref Range Status   SARS Coronavirus 2 by RT PCR NEGATIVE NEGATIVE Final    Comment: (NOTE) SARS-CoV-2 target nucleic acids are NOT DETECTED. The SARS-CoV-2 RNA is generally detectable in upper respiratoy specimens during the acute phase of infection. The lowest concentration of SARS-CoV-2 viral copies this assay can detect is 131 copies/mL. A negative result does not preclude SARS-Cov-2 infection and should not be used as the sole basis for treatment or other patient management decisions. A negative result may occur with  improper specimen collection/handling, submission of specimen other than nasopharyngeal swab, presence of viral mutation(s) within the areas targeted by this assay, and inadequate number of viral copies (<131 copies/mL). A negative  result must be combined with clinical observations, patient history, and epidemiological information. The expected result is Negative. Fact Sheet for Patients:  PinkCheek.be Fact Sheet for Healthcare Providers:  GravelBags.it This  test is not yet ap proved or cleared by the [place] and  has been authorized for detection and/or diagnosis of SARS-CoV-2 by FDA under an Emergency Use Authorization (EUA). This EUA will remain  in effect (meaning this test can be used) for the duration of the COVID-19 declaration under Section 564(b)(1) of the Act, 21 U.S.C. section 360bbb-3(b)(1), unless the authorization is terminated or revoked sooner.    Influenza A by PCR NEGATIVE NEGATIVE Final   Influenza B by PCR NEGATIVE NEGATIVE Final    Comment: (NOTE) The Xpert Xpress SARS-CoV-2/FLU/RSV assay is intended as an aid in  the diagnosis of influenza from Nasopharyngeal swab specimens and  should not be used as a sole basis for treatment. Nasal washings and  aspirates are unacceptable for Xpert Xpress SARS-CoV-2/FLU/RSV  testing. Fact Sheet for Patients: PinkCheek.be Fact Sheet for Healthcare Providers: GravelBags.it This test is not yet approved or cleared by the [place] FDA and  has been authorized for detection and/or diagnosis of SARS-CoV-2 by  FDA under an Emergency Use Authorization (EUA). This EUA will remain  in effect (meaning this test can be used) for the duration of the  Covid-19 declaration under Section 564(b)(1) of the Act, 21  U.S.C. section 360bbb-3(b)(1), unless the authorization is  terminated or revoked. Performed at [facility], [address], [place], [place] [address]      Time coordinating discharge: Over 30 minutes  SIGNED:   Wyvonnia Dusky, MD  [facility] 08/19/2019, 11:46 AM Pager   If 7PM-7AM, please  contact night-coverage [email or web address]

## 2019-08-20 ENCOUNTER — Ambulatory Visit: Payer: Medicaid Other | Admitting: Internal Medicine

## 2019-08-25 LAB — 25-HYDROXY VITAMIN D LCMS D2+D3
25-Hydroxy, Vitamin D-2: <1 ng/mL
25-Hydroxy, Vitamin D-3: 3.3 ng/mL
25-Hydroxy, Vitamin D: 3.7 ng/mL — ABNORMAL LOW

## 2019-08-29 ENCOUNTER — Ambulatory Visit: Payer: Medicaid Other | Admitting: Family

## 2019-08-29 ENCOUNTER — Telehealth: Payer: Self-pay | Admitting: Family

## 2019-08-29 NOTE — Progress Notes (Deleted)
Patient ID: [name] [name], female    DOB: 03-24-1961, 59 y.o.   MRN: [id number]  HPI  Ms [name] [name] is a 59 y/o female with a history of DM, HTN, anemia, chronic hepatitis C and chronic heart failure.   Echo report from 06/04/2019 reviewed and showed an EF of 55-60% along with mild MS.   Admitted 08/17/19 due to chest pain and shortness of breath. Started on bronchodilators, loratadine, ipratropium nasal spray & steroids. No need for oxygen. Furosemide held due to AKI. COVID negative. Discharged after 2 days.  Admitted 06/02/2019 due to acute HF exacerbation. Cardiology consult obtained. Initially given IV lasix and then transitioned to oral diuretics. Given 1 unit of PRBC's due to anemia. Discharged after 3 days. Was in the ED 04/20/2019 due to facial swelling most likely related to lisinopril use. She was treated and released.   She presents today for a follow-up visit with a chief complaint of   Past Medical History:  Diagnosis Date  . Anemia   . CHF (congestive heart failure) ([place])   . Chronic hepatitis C without hepatic coma ([place]) 11/09/2016  . Diabetes mellitus   . Fibroids   . 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 Hypertension   . MRSA (methicillin resistant Staphylococcus aureus)   . Trichomonas   . VAGINITIS, BACTERIAL, RECURRENT 08/15/2007   Qualifier: Diagnosis of  By: Radene Ou MD, [place]     Past Surgical History:  Procedure Laterality Date  . BREAST BIOPSY Left 2018  . CESAREAN SECTION     breech   Family History  Problem Relation Age of Onset  . Colon cancer Mother   . Liver disease Sister   . Other Neg Hx   . Breast cancer Neg Hx   . Esophageal cancer Neg Hx   . Rectal cancer Neg Hx    Social History   Tobacco Use  . Smoking status: Never Smoker  . Smokeless tobacco: Never Used  Substance Use Topics  . Alcohol use: No   Allergies  Allergen Reactions  . Lisinopril Swelling      Review of Systems  Constitutional:  Positive for fatigue. Negative for appetite change.  HENT: Negative for congestion, postnasal drip and sore throat.   Eyes: Negative.   Respiratory: Positive for shortness of breath (with moderate exertion). Negative for chest tightness.   Cardiovascular: Negative for chest pain, palpitations and leg swelling.  Gastrointestinal: Negative for abdominal distention and abdominal pain.  Endocrine: Negative.   Genitourinary: Negative.   Musculoskeletal: Negative for back pain and neck pain.  Skin: Negative.   Allergic/Immunologic: Negative.   Neurological: Positive for numbness (in legs due to diabetes). Negative for dizziness and light-headedness.  Hematological: Negative for adenopathy. Does not bruise/bleed easily.  Psychiatric/Behavioral: Negative for dysphoric mood and sleep disturbance (sleeping on 2 pillows). The patient is not nervous/anxious.       Physical Exam Vitals and nursing note reviewed.  Constitutional:      Appearance: Normal appearance.  HENT:     Head: Normocephalic and atraumatic.  Cardiovascular:     Rate and Rhythm: Normal rate and regular rhythm.  Pulmonary:     Effort: Pulmonary effort is normal. No respiratory distress.     Breath sounds: No wheezing or rales.  Abdominal:     General: There is no distension.     Palpations: Abdomen is soft.     Tenderness: There is no abdominal tenderness.  Musculoskeletal:  General: No tenderness.     Cervical back: Normal range of motion and neck supple.     Right lower leg: No edema.     Left lower leg: No edema.  Skin:    General: Skin is warm and dry.  Neurological:     General: No focal deficit present.     Mental Status: She is alert and oriented to person, place, and time.  Psychiatric:        Mood and Affect: Mood normal.        Behavior: Behavior normal.        Thought Content: Thought content normal.     Assessment & Plan:  1: Chronic heart failure with preserved ejection fraction- - NYHA  class II - euvolemic today - not weighing daily but does have scales at home; instructed to weigh every morning after using the bathroom, write the weight down and call for an overnight weight gain of >2 pounds or a weekly weight gain of >5 pounds - weight  - not adding salt and is trying to read labels for sodium content - saw cardiology (Hochrein) 08/02/19 - BNP 08/17/19 was 40.0 - she says that she's received her flu vaccine for this season  2: HTN- - BP - saw PCP (Newlin)07/10/2019 - BMP 08/19/19 reviewed and showed sodium 137, potassium 4.1, creatinine 1.83 and GFR 35  3: Sleep apnea- - bipap titration study done 03/20/2019 - waiting on bipap equipment  4: DM- - nonfasting glucose in clinic today was   - A1c 08/18/19 was 10.0%   Patient did not bring her medications nor a list. Each medication was verbally reviewed with the patient and she was encouraged to bring the bottles to every visit to confirm accuracy of list.

## 2019-08-29 NOTE — Telephone Encounter (Signed)
Patient did not show for her [facility] appointment on 08/29/19. Will attempt to reschedule.

## 2019-09-04 ENCOUNTER — Other Ambulatory Visit: Payer: Self-pay

## 2019-09-04 ENCOUNTER — Encounter: Payer: Self-pay | Admitting: Family Medicine

## 2019-09-04 ENCOUNTER — Ambulatory Visit: Payer: Medicaid Other | Attending: Family Medicine | Admitting: Family Medicine

## 2019-09-04 VITALS — BP 161/84 | HR 86 | Ht 60.0 in | Wt 241.0 lb

## 2019-09-04 DIAGNOSIS — E1122 Type 2 diabetes mellitus with diabetic chronic kidney disease: Secondary | ICD-10-CM | POA: Diagnosis not present

## 2019-09-04 DIAGNOSIS — D638 Anemia in other chronic diseases classified elsewhere: Secondary | ICD-10-CM | POA: Diagnosis not present

## 2019-09-04 DIAGNOSIS — Z794 Long term (current) use of insulin: Secondary | ICD-10-CM | POA: Diagnosis not present

## 2019-09-04 DIAGNOSIS — I5032 Chronic diastolic (congestive) heart failure: Secondary | ICD-10-CM | POA: Diagnosis not present

## 2019-09-04 DIAGNOSIS — I110 Hypertensive heart disease with heart failure: Secondary | ICD-10-CM

## 2019-09-04 DIAGNOSIS — E1149 Type 2 diabetes mellitus with other diabetic neurological complication: Secondary | ICD-10-CM | POA: Diagnosis not present

## 2019-09-04 DIAGNOSIS — R3989 Other symptoms and signs involving the genitourinary system: Secondary | ICD-10-CM

## 2019-09-04 DIAGNOSIS — E11311 Type 2 diabetes mellitus with unspecified diabetic retinopathy with macular edema: Secondary | ICD-10-CM | POA: Diagnosis not present

## 2019-09-04 DIAGNOSIS — N183 Chronic kidney disease, stage 3 unspecified: Secondary | ICD-10-CM | POA: Diagnosis not present

## 2019-09-04 LAB — GLUCOSE, POCT (MANUAL RESULT ENTRY): POC Glucose: 218 mg/dl — AB (ref 70–99)

## 2019-09-04 MED ORDER — MISC. DEVICES MISC: 0 refills | Status: DC

## 2019-09-04 MED ORDER — DEXTROMETHORPHAN-GUAIFENESIN 10-100 MG/5ML PO LIQD: 5.0000 mL | Freq: Four times a day (QID) | ORAL | 0 refills | Status: DC | PRN

## 2019-09-04 NOTE — Progress Notes (Signed)
Subjective:  Patient ID: [name] [name], female    DOB: [birth year]  Age: 59 y.o. MRN: [id number]  CC: Hospitalization Follow-up   HPI [name] [name] is a 59 year old female with a history of type 2 diabetes mellitus (A1c10.0), diabetic neuropathy hypertension, diabetic retinopathy, hyperlipidemia, hepatitis C (completed treatment with harvoni), OSA, diastolic CHF (EF 55 to 21%)  who presents today for a follow up visit. She was recently hospitalized from 08/17/2019 through 08/19/2019 for dyspnea secondary to acute bronchitis; she tested negative for COVID-19 and influenza. Treated with steroids, bronchodilators with resulting improvement in symptoms with no need for oxygen supplementation. Chest x-ray revealed no active disease, borderline to mild cardiomegaly.  She presents today and endorses dyspnea on climbing up the stairs.  Her blood pressure is elevated and she endorses compliance with her antihypertensive.  She does show me some of her empty medication bottles and is requesting refilla however review of her chart indicates she has refills at the pharmacy. She has a rash in her perineum which she would like looked at.  Thinks it has been present for the last few days, denies pruritus or pain and has no vaginal or urinary symptoms.  She does report history of herpes several years ago. Past Medical History:  Diagnosis Date  . Anemia   . CHF (congestive heart failure) ([place])   . Chronic hepatitis C without hepatic coma ([facility]) 11/09/2016  . Diabetes mellitus   . Fibroids   . 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 Hypertension   . MRSA (methicillin resistant Staphylococcus aureus)   . Trichomonas   . VAGINITIS, BACTERIAL, RECURRENT 08/15/2007   Qualifier: Diagnosis of  By: Radene Ou MD, [place]      Past Surgical History:  Procedure Laterality Date  . BREAST BIOPSY Left 2018  . CESAREAN SECTION     breech    Family History  Problem Relation Age of  Onset  . Colon cancer Mother   . Liver disease Sister   . Other Neg Hx   . Breast cancer Neg Hx   . Esophageal cancer Neg Hx   . Rectal cancer Neg Hx     Allergies  Allergen Reactions  . Lisinopril Swelling    Outpatient Medications Prior to Visit  Medication Sig Dispense Refill  . albuterol (VENTOLIN HFA) 108 (90 Base) MCG/ACT inhaler Inhale 2 puffs into the lungs every 6 (six) hours as needed for wheezing or shortness of breath. 18 g 3  . amLODipine (NORVASC) 10 MG tablet Take 1 tablet (10 mg total) by mouth daily. 30 tablet 5  . atorvastatin (LIPITOR) 40 MG tablet Take 1 tablet (40 mg total) by mouth daily. 30 tablet 6  . blood glucose meter kit and supplies KIT Dispense based on patient and insurance preference. Use up to four times daily as directed. (FOR ICD-9 250.00, 250.01). 1 each 2  . ferrous sulfate 325 (65 FE) MG tablet Take 1 tablet (325 mg total) by mouth 2 (two) times daily with a meal. 60 tablet 0  . furosemide (LASIX) 40 MG tablet Take 1 tablet (40 mg total) by mouth 2 (two) times daily. 60 tablet 6  . hydrALAZINE (APRESOLINE) 100 MG tablet Take 1 tablet (100 mg total) by mouth 3 (three) times daily. 90 tablet 6  . Insulin Glargine (LANTUS SOLOSTAR) 100 UNIT/ML Solostar Pen Inject 65 Units into the skin at bedtime. 5 pen 5  . ipratropium (ATROVENT) 0.06 % nasal spray Place  2 sprays into both nostrils 3 (three) times daily. 18 mL 0  . labetalol (NORMODYNE) 100 MG tablet Take 1 tablet (100 mg total) by mouth 2 (two) times daily. 180 tablet 3  . liraglutide (VICTOZA) 18 MG/3ML SOPN Start 0.54m SQ once a day for 7 days, then increase to 1.251monce a day for 7 days, then increase to 1.8 mg daily thereafter 3 pen 6  . loratadine (CLARITIN) 10 MG tablet Take 1 tablet (10 mg total) by mouth daily. 30 tablet 0  . LYRICA 75 MG capsule Take 1 capsule (75 mg total) by mouth 2 (two) times daily. 60 capsule 5  . lisinopril (ZESTRIL) 20 MG tablet Take 20 mg by mouth daily.     No  facility-administered medications prior to visit.     ROS Review of Systems  Constitutional: Negative for activity change, appetite change and fatigue.  HENT: Negative for congestion, sinus pressure and sore throat.   Eyes: Negative for visual disturbance.  Respiratory: Positive for shortness of breath (on exertion). Negative for cough, chest tightness and wheezing.   Cardiovascular: Negative for chest pain and palpitations.  Gastrointestinal: Negative for abdominal distention, abdominal pain and constipation.  Endocrine: Negative for polydipsia.  Genitourinary: Negative for dysuria and frequency.  Musculoskeletal: Negative for arthralgias and back pain.  Skin: Positive for rash.  Neurological: Negative for tremors, light-headedness and numbness.  Hematological: Does not bruise/bleed easily.  Psychiatric/Behavioral: Negative for agitation and behavioral problems.    Objective:  BP (!) 161/84   Pulse 86   Ht 5' (1.524 m)   Wt 241 lb (109.3 kg)   LMP 04/01/2010   SpO2 97%   BMI 47.07 kg/m   BP/Weight 09/04/2019 08/19/2019 07/25/32/2876Systolic BP [id number]  Diastolic BP 84 77 -  Wt. (Lbs) 241 - 248.9  BMI 47.07 - 48.61      Physical Exam Constitutional:      Appearance: She is well-developed.  Neck:     Vascular: No JVD.  Cardiovascular:     Rate and Rhythm: Normal rate.     Heart sounds: Normal heart sounds. No murmur.  Pulmonary:     Effort: Pulmonary effort is normal.     Breath sounds: Normal breath sounds. No wheezing or rales.  Chest:     Chest wall: No tenderness.  Abdominal:     General: Bowel sounds are normal. There is no distension.     Palpations: Abdomen is soft. There is no mass.     Tenderness: There is no abdominal tenderness.  Genitourinary:    Comments: Superficial abrasion in left perineum at 7 o'clock Musculoskeletal:        General: Normal range of motion.     Right lower leg: No edema.     Left lower leg: No edema.  Neurological:      Mental Status: She is alert and oriented to person, place, and time.  Psychiatric:        Mood and Affect: Mood normal.     CMP Latest Ref Rng & Units 08/19/2019 08/18/2019 08/17/2019  Glucose 70 - 99 mg/dL 235(H) 111(H) 155(H)  BUN 6 - 20 mg/dL 54(H) 56(H) 60(H)  Creatinine 0.44 - 1.00 mg/dL 1.83(H) 2.11(H) 2.44(H)  Sodium 135 - 145 mmol/L 137 142 140  Potassium 3.5 - 5.1 mmol/L 4.1 3.9 4.1  Chloride 98 - 111 mmol/L 110 112(H) 108  CO2 22 - 32 mmol/L 23 18(L) 22  Calcium 8.9 - 10.3 mg/dL 7.6(L) 7.7(L) 8.1(L)  Total Protein 6.5 - 8.1 g/dL - 7.1 -  Total Bilirubin 0.3 - 1.2 mg/dL - 0.5 -  Alkaline Phos 38 - 126 U/L - 58 -  AST 15 - 41 U/L - 12(L) -  ALT 0 - 44 U/L - 10 -    Lipid Panel     Component Value Date/Time   CHOL 147 01/29/2019 1118   TRIG 102 01/29/2019 1118   HDL 59 01/29/2019 1118   CHOLHDL 2.5 01/29/2019 1118   CHOLHDL 3 06/18/2014 0951   VLDL 19.6 06/18/2014 0951   LDLCALC 68 01/29/2019 1118    CBC    Component Value Date/Time   WBC 4.8 08/19/2019 0503   RBC 3.06 (L) 08/19/2019 0503   HGB 8.2 (L) 08/19/2019 0503   HGB 9.8 (L) 07/10/2019 1544   HCT 26.6 (L) 08/19/2019 0503   HCT 30.9 (L) 07/10/2019 1544   PLT 168 08/19/2019 0503   PLT 171 07/10/2019 1544   MCV 86.9 08/19/2019 0503   MCV 86 07/10/2019 1544   MCH 26.8 08/19/2019 0503   MCHC 30.8 08/19/2019 0503   RDW 13.7 08/19/2019 0503   RDW 12.7 07/10/2019 1544   LYMPHSABS 1.3 07/10/2019 1544   MONOABS 0.4 06/05/2019 0442   EOSABS 0.0 07/10/2019 1544   BASOSABS 0.0 07/10/2019 1544    Lab Results  Component Value Date   HGBA1C 10.0 (H) 08/18/2019    Assessment & Plan:  1. 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 Type 2 diabetes mellitus with other neurologic complication, with long-term current use of insulin ([facility]) Neuropathy is controlled on Lyrica - POCT glucose (manual entry)  3. Type 2 diabetes mellitus with stage 3 chronic kidney disease, with long-term current use of insulin, unspecified whether stage 3a or 3b CKD ([place]) Diabetic nephropathy with last creatinine of 1.83 Avoid nephrotoxins - Ambulatory referral to Nephrology  4.  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 Devices MISC; Tax inspector  Dx- CHF; Duration-lifetime. Weight 241 lbs  Dispense: 1 each; Refill: 0  5. Genital sore Suspicious for abrasion secondary to friction We will evaluate for herpes given remote history - HSV Type I/II IgG, IgMw/ reflex  6. Anemia of chronic disease Colonoscopy unrevealing in 09/2018 Could be secondary to chronic kidney disease She has been referred to nephrology for consideration for possible iron replacement therapy Continue ferrous sulfate   Meds ordered this encounter  Medications  . Misc. Devices MISC    Sig: Tax inspector  Dx- CHF; Duration-lifetime. Weight 241 lbs    Dispense:  1 each    Refill:  0    Keep previously scheduled appointment       [name], MD, FAAFP. [place]  [place], [place]   09/04/2019, 10:31 AM

## 2019-09-04 NOTE — Progress Notes (Signed)
Patient is requesting a Rolator and a shower chair.

## 2019-09-05 LAB — HSV TYPE I/II IGG, IGMW/ REFLEX
HSV 1 Glycoprotein G Ab, IgG: 15.8 index — ABNORMAL HIGH (ref 0.00–0.90)
HSV 1 IgM: <1:10 {titer}
HSV 2 IgG, Type Spec: 17 index — ABNORMAL HIGH (ref 0.00–0.90)
HSV 2 IgM: <1:10 {titer}

## 2019-09-06 ENCOUNTER — Telehealth: Payer: Self-pay

## 2019-09-06 DIAGNOSIS — E1122 Type 2 diabetes mellitus with diabetic chronic kidney disease: Secondary | ICD-10-CM | POA: Diagnosis not present

## 2019-09-06 DIAGNOSIS — E1149 Type 2 diabetes mellitus with other diabetic neurological complication: Secondary | ICD-10-CM | POA: Diagnosis not present

## 2019-09-06 DIAGNOSIS — Z794 Long term (current) use of insulin: Secondary | ICD-10-CM | POA: Diagnosis not present

## 2019-09-06 NOTE — Telephone Encounter (Signed)
-----   Message from [name], MD sent at 09/05/2019  8:35 PM EST ----- Labs reveal rash she has is not Herpes. 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

## 2019-09-06 NOTE — Telephone Encounter (Signed)
Patient was called and a voicemail was left informing patient to return phone call for lab results. 

## 2019-09-07 ENCOUNTER — Telehealth: Payer: Self-pay

## 2019-09-07 NOTE — Telephone Encounter (Signed)
-----   Message from [name], MD sent at 09/05/2019  8:35 PM EST ----- Labs reveal rash she has is not Herpes. 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

## 2019-09-07 NOTE — Telephone Encounter (Signed)
Patient was called and a voicemail was left informing patient to return phone call for lab results. 

## 2019-09-24 ENCOUNTER — Telehealth: Payer: Self-pay | Admitting: Family Medicine

## 2019-09-24 NOTE — Telephone Encounter (Signed)
Pt was called and reminded of there appointment Mailbox was full

## 2019-09-25 ENCOUNTER — Other Ambulatory Visit: Payer: Self-pay

## 2019-09-25 ENCOUNTER — Ambulatory Visit: Payer: Medicaid Other | Admitting: Family Medicine

## 2019-09-26 ENCOUNTER — Encounter: Payer: Self-pay | Admitting: Cardiology

## 2019-10-08 ENCOUNTER — Ambulatory Visit: Payer: Medicaid Other | Attending: Family Medicine | Admitting: Family Medicine

## 2019-10-08 ENCOUNTER — Other Ambulatory Visit: Payer: Self-pay

## 2019-10-24 DIAGNOSIS — I5032 Chronic diastolic (congestive) heart failure: Secondary | ICD-10-CM | POA: Diagnosis not present

## 2019-10-24 DIAGNOSIS — R809 Proteinuria, unspecified: Secondary | ICD-10-CM | POA: Diagnosis not present

## 2019-10-24 DIAGNOSIS — N1832 Chronic kidney disease, stage 3b: Secondary | ICD-10-CM | POA: Diagnosis not present

## 2019-10-24 DIAGNOSIS — N179 Acute kidney failure, unspecified: Secondary | ICD-10-CM | POA: Diagnosis not present

## 2019-10-24 DIAGNOSIS — D631 Anemia in chronic kidney disease: Secondary | ICD-10-CM | POA: Diagnosis not present

## 2019-10-24 DIAGNOSIS — I129 Hypertensive chronic kidney disease with stage 1 through stage 4 chronic kidney disease, or unspecified chronic kidney disease: Secondary | ICD-10-CM | POA: Diagnosis not present

## 2019-10-24 DIAGNOSIS — E1129 Type 2 diabetes mellitus with other diabetic kidney complication: Secondary | ICD-10-CM | POA: Diagnosis not present

## 2019-10-24 DIAGNOSIS — E1122 Type 2 diabetes mellitus with diabetic chronic kidney disease: Secondary | ICD-10-CM | POA: Diagnosis not present

## 2019-10-25 DIAGNOSIS — N390 Urinary tract infection, site not specified: Secondary | ICD-10-CM | POA: Diagnosis not present

## 2019-10-31 ENCOUNTER — Telehealth: Payer: Self-pay | Admitting: Family Medicine

## 2019-10-31 NOTE — Telephone Encounter (Signed)
Pt stated pharmacy would not give refill on prescription liraglutide (VICTOZA) but she stated she is out.

## 2019-10-31 NOTE — Telephone Encounter (Signed)
Medication needs a prior auth, information has been sent over to [place] in the pharmacy to obtain the PA.

## 2019-10-31 NOTE — Telephone Encounter (Signed)
Prior Josem Kaufmann is approved thru 10/30/2020

## 2019-11-05 ENCOUNTER — Inpatient Hospital Stay
Admission: EM | Admit: 2019-11-05 | Discharge: 2019-11-08 | DRG: 291 | Disposition: A | Discharge status: Home Health | Payer: Medicaid Other | Attending: Internal Medicine | Admitting: Internal Medicine

## 2019-11-05 ENCOUNTER — Emergency Department: Payer: Medicaid Other

## 2019-11-05 ENCOUNTER — Other Ambulatory Visit: Payer: Self-pay

## 2019-11-05 DIAGNOSIS — D631 Anemia in chronic kidney disease: Secondary | ICD-10-CM | POA: Diagnosis present

## 2019-11-05 DIAGNOSIS — Z20822 Contact with and (suspected) exposure to covid-19: Secondary | ICD-10-CM | POA: Diagnosis present

## 2019-11-05 DIAGNOSIS — I509 Heart failure, unspecified: Secondary | ICD-10-CM

## 2019-11-05 DIAGNOSIS — E0865 Diabetes mellitus due to underlying condition with hyperglycemia: Secondary | ICD-10-CM

## 2019-11-05 DIAGNOSIS — N189 Chronic kidney disease, unspecified: Secondary | ICD-10-CM

## 2019-11-05 DIAGNOSIS — J449 Chronic obstructive pulmonary disease, unspecified: Secondary | ICD-10-CM | POA: Diagnosis present

## 2019-11-05 DIAGNOSIS — E662 Morbid (severe) obesity with alveolar hypoventilation: Secondary | ICD-10-CM | POA: Diagnosis present

## 2019-11-05 DIAGNOSIS — Z888 Allergy status to other drugs, medicaments and biological substances status: Secondary | ICD-10-CM

## 2019-11-05 DIAGNOSIS — Z9114 Patient's other noncompliance with medication regimen: Secondary | ICD-10-CM

## 2019-11-05 DIAGNOSIS — E1140 Type 2 diabetes mellitus with diabetic neuropathy, unspecified: Secondary | ICD-10-CM | POA: Diagnosis present

## 2019-11-05 DIAGNOSIS — E1165 Type 2 diabetes mellitus with hyperglycemia: Secondary | ICD-10-CM | POA: Diagnosis not present

## 2019-11-05 DIAGNOSIS — B182 Chronic viral hepatitis C: Secondary | ICD-10-CM | POA: Diagnosis present

## 2019-11-05 DIAGNOSIS — D509 Iron deficiency anemia, unspecified: Secondary | ICD-10-CM | POA: Diagnosis present

## 2019-11-05 DIAGNOSIS — Z91148 Patient's other noncompliance with medication regimen for other reason: Secondary | ICD-10-CM

## 2019-11-05 DIAGNOSIS — D259 Leiomyoma of uterus, unspecified: Secondary | ICD-10-CM | POA: Diagnosis present

## 2019-11-05 DIAGNOSIS — Z79899 Other long term (current) drug therapy: Secondary | ICD-10-CM

## 2019-11-05 DIAGNOSIS — Z6841 Body Mass Index (BMI) 40.0 and over, adult: Secondary | ICD-10-CM

## 2019-11-05 DIAGNOSIS — R0602 Shortness of breath: Secondary | ICD-10-CM | POA: Diagnosis not present

## 2019-11-05 DIAGNOSIS — E1142 Type 2 diabetes mellitus with diabetic polyneuropathy: Secondary | ICD-10-CM | POA: Diagnosis present

## 2019-11-05 DIAGNOSIS — I10 Essential (primary) hypertension: Secondary | ICD-10-CM

## 2019-11-05 DIAGNOSIS — G4730 Sleep apnea, unspecified: Secondary | ICD-10-CM | POA: Diagnosis present

## 2019-11-05 DIAGNOSIS — Z794 Long term (current) use of insulin: Secondary | ICD-10-CM

## 2019-11-05 DIAGNOSIS — Z800 Family history of malignant neoplasm of digestive organs: Secondary | ICD-10-CM

## 2019-11-05 DIAGNOSIS — I5033 Acute on chronic diastolic (congestive) heart failure: Secondary | ICD-10-CM | POA: Diagnosis present

## 2019-11-05 DIAGNOSIS — E1122 Type 2 diabetes mellitus with diabetic chronic kidney disease: Secondary | ICD-10-CM | POA: Diagnosis present

## 2019-11-05 DIAGNOSIS — G4733 Obstructive sleep apnea (adult) (pediatric): Secondary | ICD-10-CM | POA: Diagnosis present

## 2019-11-05 DIAGNOSIS — N1832 Chronic kidney disease, stage 3b: Secondary | ICD-10-CM | POA: Diagnosis present

## 2019-11-05 DIAGNOSIS — I130 Hypertensive heart and chronic kidney disease with heart failure and stage 1 through stage 4 chronic kidney disease, or unspecified chronic kidney disease: Principal | ICD-10-CM | POA: Diagnosis present

## 2019-11-05 DIAGNOSIS — J9601 Acute respiratory failure with hypoxia: Secondary | ICD-10-CM | POA: Diagnosis present

## 2019-11-05 LAB — CBC
HCT: 27.1 % — ABNORMAL LOW (ref 36.0–46.0)
Hemoglobin: 8.7 g/dL — ABNORMAL LOW (ref 12.0–15.0)
MCH: 28.1 pg (ref 26.0–34.0)
MCHC: 32.1 g/dL (ref 30.0–36.0)
MCV: 87.4 fL (ref 80.0–100.0)
Platelets: 161 10*3/uL (ref 150–400)
RBC: 3.1 MIL/uL — ABNORMAL LOW (ref 3.87–5.11)
RDW: 13.7 % (ref 11.5–15.5)
WBC: 4.7 10*3/uL (ref 4.0–10.5)
nRBC: 0 % (ref 0.0–0.2)

## 2019-11-05 LAB — BASIC METABOLIC PANEL
Anion gap: 7 (ref 5–15)
BUN: 47 mg/dL — ABNORMAL HIGH (ref 6–20)
CO2: 24 mmol/L (ref 22–32)
Calcium: 8.5 mg/dL — ABNORMAL LOW (ref 8.9–10.3)
Chloride: 110 mmol/L (ref 98–111)
Creatinine, Ser: 1.91 mg/dL — ABNORMAL HIGH (ref 0.44–1.00)
GFR calc Af Amer: 33 mL/min — ABNORMAL LOW (ref >60)
GFR calc non Af Amer: 28 mL/min — ABNORMAL LOW (ref >60)
Glucose, Bld: 292 mg/dL — ABNORMAL HIGH (ref 70–99)
Potassium: 4.1 mmol/L (ref 3.5–5.1)
Sodium: 141 mmol/L (ref 135–145)

## 2019-11-05 LAB — TROPONIN I (HIGH SENSITIVITY): Troponin I (High Sensitivity): 10 ng/L (ref <18)

## 2019-11-05 MED ORDER — SODIUM CHLORIDE 0.9% FLUSH: 3.0000 mL | Freq: Once | INTRAVENOUS | Status: DC

## 2019-11-05 MED ORDER — FUROSEMIDE 10 MG/ML IJ SOLN
40.0000 mg | Freq: Once | INTRAMUSCULAR | Status: AC
  Administered 2019-11-05: 40 mg via INTRAVENOUS
  Filled 2019-11-05: qty 4

## 2019-11-05 NOTE — ED Provider Notes (Addendum)
[facility] Emergency Department Provider Note  ____________________________________________   First MD Initiated Contact with Patient 11/05/19 2305     (approximate)  I have reviewed the triage vital signs and the nursing notes.   HISTORY  Chief Complaint Shortness of Breath and Leg Swelling    HPI [name] [name] is a 59 y.o. female with medical history as listed below which notably includes documented CHF (listed separately as congestive heart failure and diastolic heart failure), obesity, and what she describes as a history of COPD although I do not see it in her medical history and she denies history of tobacco use.  She presents tonight by private vehicle reporting worsening shortness of breath associated with leg swelling and some swelling in general most notable in the face over the last 3 to 4 days.  Exertion and lying flat make her symptoms worse, nothing in particular makes them better.  She denies having any pain including chest and abdominal pain.  She said that she has had an increasingly bad cough.  She does not have an oxygen requirement at home but was 88% on room air in triage.  She says she is compliant with her medications including furosemide 40 mg by mouth twice daily.  She denies fever/chills, sore throat, and loss of smell and taste.  She has had no nausea nor vomiting.  She describes the shortness of breath as having become severe.         Past Medical History:  Diagnosis Date  . Anemia   . CHF (congestive heart failure) ([place])   . Chronic hepatitis C without hepatic coma ([place]) 11/09/2016  . Diabetes mellitus   . Fibroids   . 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 Hypertension   . MRSA (methicillin resistant Staphylococcus aureus)   . Trichomonas   . VAGINITIS, BACTERIAL, RECURRENT 08/15/2007   Qualifier: Diagnosis of  By: Radene Ou MD, [place]      Patient Active Problem List   Diagnosis Date Noted   . COPD with acute bronchitis ([place])   . Acute kidney failure ([place]) 08/18/2019  . Left ventricular hypertrophy 07/21/2019  . Educated about COVID-19 virus infection 07/21/2019  . Acute on chronic diastolic heart failure ([place])   . Acute CHF (congestive heart failure) ([place]) 06/03/2019  . Hypoxia   . Dyspnea on exertion 05/09/2019  . Sleep apnea 05/09/2019  . Hyperglycemia 04/09/2019  . Snoring 03/13/2019  . Symptomatic anemia 08/17/2018  . Acute renal failure (ARF) ([place]) 08/17/2018  . Chronic cystitis 08/03/2018  . Diabetic neuropathy ([place]) 06/16/2017  . Non compliance w medication regimen 04/12/2017  . Family history of colon cancer in mother 11/17/2016  . Dyspareunia in female 11/11/2016  . Screen for colon cancer 11/11/2016  . History of ovarian cyst 11/11/2016  . Chronic hepatitis C without hepatic coma ([place]) 11/09/2016  . Hyperlipidemia 10/07/2016  . Type 2 diabetes mellitus ([place]) 01/29/2014  . BREAST PAIN, BILATERAL 02/19/2010  . ANEMIA, IRON DEFICIENCY 10/03/2009  . LEUKOPENIA, MILD 09/19/2009  . Chest pain 09/19/2009  . LIPOMA 06/18/2009  . 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 TINEA PEDIS 05/07/2009  . SKIN LESION 05/07/2009  . COUGH 05/07/2009  . SUBSTANCE ABUSE, MULTIPLE 02/22/2007  . Essential hypertension 02/22/2007    Past Surgical History:  Procedure Laterality Date  . BREAST BIOPSY Left 2018  . CESAREAN SECTION     breech    Prior to Admission medications   Medication  Sig Start Date End Date Taking? Authorizing Provider  albuterol (VENTOLIN HFA) 108 (90 Base) MCG/ACT inhaler Inhale 2 puffs into the lungs every 6 (six) hours as needed for wheezing or shortness of breath. 04/23/19   [name], MD  amLODipine (NORVASC) 10 MG tablet Take 1 tablet (10 mg total) by mouth daily. 07/10/19   [name], MD  atorvastatin (LIPITOR) 40 MG tablet Take 1 tablet (40 mg total) by mouth daily. 07/10/19   [name], MD  blood glucose meter kit and  supplies KIT Dispense based on patient and insurance preference. Use up to four times daily as directed. (FOR ICD-9 250.00, 250.01). 04/12/19   Dustin Flock, MD  ferrous sulfate 325 (65 FE) MG tablet Take 1 tablet (325 mg total) by mouth 2 (two) times daily with a meal. 08/19/19 09/18/19  Wyvonnia Dusky, MD  furosemide (LASIX) 40 MG tablet Take 1 tablet (40 mg total) by mouth 2 (two) times daily. 07/10/19 07/09/20  [name], MD  hydrALAZINE (APRESOLINE) 100 MG tablet Take 1 tablet (100 mg total) by mouth 3 (three) times daily. 07/10/19 07/09/20  [name], MD  Insulin Glargine (LANTUS SOLOSTAR) 100 UNIT/ML Solostar Pen Inject 65 Units into the skin at bedtime. 07/10/19   [name], MD  ipratropium (ATROVENT) 0.06 % nasal spray Place 2 sprays into both nostrils 3 (three) times daily. 08/19/19 09/18/19  Wyvonnia Dusky, MD  labetalol (NORMODYNE) 100 MG tablet Take 1 tablet (100 mg total) by mouth 2 (two) times daily. 05/10/19   Minus Breeding, MD  liraglutide (VICTOZA) 18 MG/3ML SOPN Start 0.21m SQ once a day for 7 days, then increase to 1.240monce a day for 7 days, then increase to 1.8 mg daily thereafter 07/10/19   NeCharlott RakesMD  lisinopril (ZESTRIL) 20 MG tablet Take 20 mg by mouth daily. 06/05/19   [provider]  loratadine (CLARITIN) 10 MG tablet Take 1 tablet (10 mg total) by mouth daily. 08/20/19 09/19/19  WiWyvonnia DuskyMD  LYRICA 75 MG capsule Take 1 capsule (75 mg total) by mouth 2 (two) times daily. 06/29/19   NeCharlott RakesMD  Misc. Devices MISC Rollator with SeParamedicDx- CHF; Duration-lifetime. Weight 241 lbs 09/04/19   NeCharlott RakesMD    Allergies Lisinopril  Family History  Problem Relation Age of Onset  . Colon cancer Mother   . Liver disease Sister   . Other Neg Hx   . Breast cancer Neg Hx   . Esophageal cancer Neg Hx   . Rectal cancer Neg Hx     Social History Social History   Tobacco Use  . Smoking status:  Never Smoker  . Smokeless tobacco: Never Used  Substance Use Topics  . Alcohol use: No  . Drug use: No    Types: Cocaine, Marijuana    Comment: remote h/o cocaine and marijuana use    Review of Systems Constitutional: No fever/chills Eyes: No visual changes. ENT: No sore throat. Cardiovascular: Denies chest pain. Respiratory: +shortness of breath, + orthopnea, + dyspnea on exertion Gastrointestinal: No abdominal pain.  No nausea, no vomiting.  No diarrhea.  No constipation. Genitourinary: Negative for dysuria. Musculoskeletal: Swelling in legs as well as some generalized swelling.  Negative for neck pain.  Negative for back pain. Integumentary: Negative for rash. Neurological: Negative for headaches, focal weakness or numbness.   ____________________________________________   PHYSICAL EXAM:  VITAL SIGNS: ED Triage Vitals  Enc Vitals Group     BP  11/05/19 1852 140/74     Pulse Rate 11/05/19 1852 80     Resp 11/05/19 1852 (!) 24     Temp 11/05/19 1852 98.2 F (36.8 C)     Temp src --      SpO2 11/05/19 1852 (!) 88 %     Weight 11/05/19 1848 112.5 kg (248 lb)     Height 11/05/19 1848 1.676 m (5' 6")     Head Circumference --      Peak Flow --      Pain Score 11/05/19 1848 6     Pain Loc --      Pain Edu? --      Excl. in [place]? --     Constitutional: Alert and oriented.  Eyes: Conjunctivae are normal.  Head: Atraumatic. Nose: No congestion/rhinnorhea. Mouth/Throat: Patient is wearing a mask. Neck: No stridor.  No meningeal signs.   Cardiovascular: Normal rate, regular rhythm. Good peripheral circulation. Grossly normal heart sounds. Respiratory: Patient is currently on 2 L of oxygen by nasal cannula and satting about 98%.  Coarse breath sounds throughout with good air movement, no wheezing.  Normal respiratory effort but increased rate.  No retractions. Gastrointestinal: Soft and nontender. No distention.  Musculoskeletal: 1+ pitting peripheral edema bilaterally. No  gross deformities of extremities. Neurologic:  Normal speech and language. No gross focal neurologic deficits are appreciated.  Skin:  Skin is warm, dry and intact. Psychiatric: Mood and affect are normal. Speech and behavior are normal.  ____________________________________________   LABS (all labs ordered are listed, but only abnormal results are displayed)  Labs Reviewed  BASIC METABOLIC PANEL - Abnormal; Notable for the following components:      Result Value   Glucose, Bld 292 (*)    BUN 47 (*)    Creatinine, Ser 1.91 (*)    Calcium 8.5 (*)    GFR calc non Af Amer 28 (*)    GFR calc Af Amer 33 (*)    All other components within normal limits  CBC - Abnormal; Notable for the following components:   RBC 3.10 (*)    Hemoglobin 8.7 (*)    HCT 27.1 (*)    All other components within normal limits  BRAIN NATRIURETIC PEPTIDE - Abnormal; Notable for the following components:   B Natriuretic Peptide 173.0 (*)    All other components within normal limits  RESPIRATORY PANEL BY RT PCR (FLU A&B, COVID)  HEPATIC FUNCTION PANEL  LIPASE, BLOOD  POC SARS CORONAVIRUS 2 AG -  ED  POC SARS CORONAVIRUS 2 AG  TROPONIN I (HIGH SENSITIVITY)  TROPONIN I (HIGH SENSITIVITY)   ____________________________________________  EKG  ED ECG REPORT I, Hinda Kehr, the attending physician, personally viewed and interpreted this ECG.  Date: 11/05/2019 EKG Time: 18: 55 Rate: 79 Rhythm: normal sinus rhythm QRS Axis: normal Intervals: normal ST/T Wave abnormalities: Non-specific ST segment / T-wave changes, but no clear evidence of acute ischemia. Narrative Interpretation: no definitive evidence of acute ischemia; does not meet STEMI criteria.   ____________________________________________  RADIOLOGY I, Hinda Kehr, personally viewed and evaluated these images (plain radiographs) as part of my medical decision making, as well as reviewing the written report by the radiologist.  ED MD  interpretation: No gross abnormalities observed on chest x-ray  Official radiology report(s): DG Chest 2 View  Result Date: 11/05/2019 CLINICAL DATA:  59 year old female with shortness of breath. EXAM: CHEST - 2 VIEW COMPARISON:  Chest radiograph dated 08/17/2019. FINDINGS: No focal consolidation, pleural effusion  or pneumothorax. The cardiac silhouette is within normal limits. No acute osseous pathology. IMPRESSION: No active cardiopulmonary disease. Electronically Signed   By: Anner Crete M.D.   On: 11/05/2019 19:18    ____________________________________________   PROCEDURES   Procedure(s) performed (including Critical Care):  .1-3 Lead EKG Interpretation Performed by: Hinda Kehr, MD Authorized by: Hinda Kehr, MD     Interpretation: normal     ECG rate:  77   ECG rate assessment: normal     Rhythm: sinus rhythm     Ectopy: none     Conduction: normal       ____________________________________________   INITIAL IMPRESSION / MDM / ASSESSMENT AND PLAN / ED COURSE  As part of my medical decision making, I reviewed the following data within the [facility] notes reviewed and incorporated, Labs reviewed , EKG interpreted , Old chart reviewed, Radiograph reviewed , Discussed with admitting physician  and reviewed Notes from prior ED visits   Differential diagnosis includes, but is not limited to, CHF exacerbation, obstructive lung disease, restrictive lung disease, ACS, COVID-19, bacterial pneumonia.  The patient is on the cardiac monitor to evaluate for evidence of arrhythmia and/or significant heart rate changes.  The patient's chest x-ray looks clear but she has a new oxygen requirement and is currently on 2 L.  EKG generally reassuring.  Stable chronic kidney disease with a creatinine of 1.9.  Stable hemoglobin over the last few months of 8.7 which is low but within line of normal range for her.  Initial high-sensitivity troponin is 10 and  I doubt this represents ACS.  She is having no pain, no tachycardia, and has no significant risk factors for blood clot and I think that her symptoms are most likely due to a heart failure exacerbation.  Her well score for PE is 0.  I have left her on 2 L of oxygen by nasal cannula and ordered furosemide 40 mg IV.  I am awaiting the results of her POC Covid test and then will plan on admission.      Clinical Course as of Nov 05 324  Tue Nov 06, 2019  0008 Spoke with lab, asked them to add on the BNP, lipase, and hepatic function panel.   [CF]  0038 B Natriuretic Peptide(!): 173.0 [CF]  0038 consulting hospitalist for admission.  Ordered COVID PCR test given negative antigen test, but I have low suspicion for COVID-19   [CF]  0046 Discussed case in person with Dr. Sidney Ace with the hospitalist service.  He will admit the patient.   [CF]  0326 SARS Coronavirus 2 by RT PCR: NEGATIVE [CF]    Clinical Course User Index [CF] Hinda Kehr, MD     ____________________________________________  FINAL CLINICAL IMPRESSION(S) / ED DIAGNOSES  Final diagnoses:  Acute respiratory failure with hypoxemia ([place])  Acute on chronic congestive heart failure, unspecified heart failure type ([place])  Chronic kidney disease, unspecified CKD stage     MEDICATIONS GIVEN DURING THIS VISIT:  Medications  sodium chloride flush (NS) 0.9 % injection 3 mL (3 mLs Intravenous Not Given 11/05/19 2328)  furosemide (LASIX) injection 40 mg (40 mg Intravenous Given 11/05/19 2359)     ED Discharge Orders    None      *Please note:  Chasya Zenz was evaluated in Emergency Department on 11/06/2019 for the symptoms described in the history of present illness. She was evaluated in the context of the global COVID-19 pandemic, which necessitated consideration that the  patient might be at risk for infection with the SARS-CoV-2 virus that causes COVID-19. Institutional protocols and algorithms that pertain to the evaluation  of patients at risk for COVID-19 are in a state of rapid change based on information released by regulatory bodies including the CDC and federal and state organizations. These policies and algorithms were followed during the patient's care in the ED.  Some ED evaluations and interventions may be delayed as a result of limited staffing during the pandemic.*  Note:  This document was prepared using Dragon voice recognition software and may include unintentional dictation errors.   Hinda Kehr, MD 11/06/19 1751    Hinda Kehr, MD 11/06/19 [phone]

## 2019-11-05 NOTE — ED Notes (Signed)
Pt's O2 at 88% RA. Pt placed on 2L [place] at this time.

## 2019-11-05 NOTE — ED Triage Notes (Signed)
Pt comes via pOV from home with c/o SOb and leg swelling. Pt states this started last Friday and has since progressed.  Pt states she feels weak and thinks her blood is low. Pt states she is out of her iron pills.  Pt states swelling in face also. Pt states she was just seen here for same thing.  Pt has labored breathing and accessory muscle use noted. Pt states productive cough.

## 2019-11-06 ENCOUNTER — Other Ambulatory Visit: Payer: Self-pay

## 2019-11-06 ENCOUNTER — Encounter: Payer: Self-pay | Admitting: Family Medicine

## 2019-11-06 DIAGNOSIS — N1832 Chronic kidney disease, stage 3b: Secondary | ICD-10-CM | POA: Diagnosis present

## 2019-11-06 DIAGNOSIS — E0865 Diabetes mellitus due to underlying condition with hyperglycemia: Secondary | ICD-10-CM

## 2019-11-06 DIAGNOSIS — E669 Obesity, unspecified: Secondary | ICD-10-CM

## 2019-11-06 DIAGNOSIS — R0602 Shortness of breath: Secondary | ICD-10-CM | POA: Diagnosis not present

## 2019-11-06 DIAGNOSIS — I5031 Acute diastolic (congestive) heart failure: Secondary | ICD-10-CM | POA: Diagnosis not present

## 2019-11-06 DIAGNOSIS — Z9114 Patient's other noncompliance with medication regimen: Secondary | ICD-10-CM | POA: Diagnosis not present

## 2019-11-06 DIAGNOSIS — I509 Heart failure, unspecified: Secondary | ICD-10-CM

## 2019-11-06 DIAGNOSIS — Z800 Family history of malignant neoplasm of digestive organs: Secondary | ICD-10-CM | POA: Diagnosis not present

## 2019-11-06 DIAGNOSIS — I10 Essential (primary) hypertension: Secondary | ICD-10-CM | POA: Diagnosis not present

## 2019-11-06 DIAGNOSIS — D649 Anemia, unspecified: Secondary | ICD-10-CM | POA: Diagnosis not present

## 2019-11-06 DIAGNOSIS — I130 Hypertensive heart and chronic kidney disease with heart failure and stage 1 through stage 4 chronic kidney disease, or unspecified chronic kidney disease: Secondary | ICD-10-CM | POA: Diagnosis not present

## 2019-11-06 DIAGNOSIS — J9601 Acute respiratory failure with hypoxia: Secondary | ICD-10-CM | POA: Diagnosis present

## 2019-11-06 DIAGNOSIS — I5033 Acute on chronic diastolic (congestive) heart failure: Secondary | ICD-10-CM | POA: Diagnosis not present

## 2019-11-06 DIAGNOSIS — N189 Chronic kidney disease, unspecified: Secondary | ICD-10-CM

## 2019-11-06 DIAGNOSIS — Z888 Allergy status to other drugs, medicaments and biological substances status: Secondary | ICD-10-CM | POA: Diagnosis not present

## 2019-11-06 DIAGNOSIS — Z79899 Other long term (current) drug therapy: Secondary | ICD-10-CM | POA: Diagnosis not present

## 2019-11-06 DIAGNOSIS — E1165 Type 2 diabetes mellitus with hyperglycemia: Secondary | ICD-10-CM

## 2019-11-06 DIAGNOSIS — Z6841 Body Mass Index (BMI) 40.0 and over, adult: Secondary | ICD-10-CM | POA: Diagnosis not present

## 2019-11-06 DIAGNOSIS — D259 Leiomyoma of uterus, unspecified: Secondary | ICD-10-CM | POA: Diagnosis present

## 2019-11-06 DIAGNOSIS — E1142 Type 2 diabetes mellitus with diabetic polyneuropathy: Secondary | ICD-10-CM | POA: Diagnosis present

## 2019-11-06 DIAGNOSIS — D631 Anemia in chronic kidney disease: Secondary | ICD-10-CM | POA: Diagnosis present

## 2019-11-06 DIAGNOSIS — E662 Morbid (severe) obesity with alveolar hypoventilation: Secondary | ICD-10-CM | POA: Diagnosis present

## 2019-11-06 DIAGNOSIS — Z794 Long term (current) use of insulin: Secondary | ICD-10-CM | POA: Diagnosis not present

## 2019-11-06 DIAGNOSIS — B182 Chronic viral hepatitis C: Secondary | ICD-10-CM | POA: Diagnosis present

## 2019-11-06 DIAGNOSIS — J449 Chronic obstructive pulmonary disease, unspecified: Secondary | ICD-10-CM | POA: Diagnosis present

## 2019-11-06 DIAGNOSIS — E1122 Type 2 diabetes mellitus with diabetic chronic kidney disease: Secondary | ICD-10-CM | POA: Diagnosis present

## 2019-11-06 DIAGNOSIS — D509 Iron deficiency anemia, unspecified: Secondary | ICD-10-CM | POA: Diagnosis present

## 2019-11-06 DIAGNOSIS — Z20822 Contact with and (suspected) exposure to covid-19: Secondary | ICD-10-CM | POA: Diagnosis not present

## 2019-11-06 LAB — BASIC METABOLIC PANEL
Anion gap: 7 (ref 5–15)
BUN: 46 mg/dL — ABNORMAL HIGH (ref 6–20)
CO2: 23 mmol/L (ref 22–32)
Calcium: 8.5 mg/dL — ABNORMAL LOW (ref 8.9–10.3)
Chloride: 110 mmol/L (ref 98–111)
Creatinine, Ser: 1.7 mg/dL — ABNORMAL HIGH (ref 0.44–1.00)
GFR calc Af Amer: 38 mL/min — ABNORMAL LOW (ref >60)
GFR calc non Af Amer: 33 mL/min — ABNORMAL LOW (ref >60)
Glucose, Bld: 240 mg/dL — ABNORMAL HIGH (ref 70–99)
Potassium: 4.1 mmol/L (ref 3.5–5.1)
Sodium: 140 mmol/L (ref 135–145)

## 2019-11-06 LAB — POC SARS CORONAVIRUS 2 AG: SARS Coronavirus 2 Ag: NEGATIVE

## 2019-11-06 LAB — TROPONIN I (HIGH SENSITIVITY): Troponin I (High Sensitivity): 12 ng/L (ref <18)

## 2019-11-06 LAB — HEPATIC FUNCTION PANEL
ALT: 12 U/L (ref 0–44)
AST: 11 U/L — ABNORMAL LOW (ref 15–41)
Albumin: 3.6 g/dL (ref 3.5–5.0)
Alkaline Phosphatase: 57 U/L (ref 38–126)
Bilirubin, Direct: <0.1 mg/dL (ref 0.0–0.2)
Total Bilirubin: 0.5 mg/dL (ref 0.3–1.2)
Total Protein: 6.9 g/dL (ref 6.5–8.1)

## 2019-11-06 LAB — RESPIRATORY PANEL BY RT PCR (FLU A&B, COVID)
Influenza A by PCR: NEGATIVE
Influenza B by PCR: NEGATIVE
SARS Coronavirus 2 by RT PCR: NEGATIVE

## 2019-11-06 LAB — GLUCOSE, CAPILLARY
Glucose-Capillary: 306 mg/dL — ABNORMAL HIGH (ref 70–99)
Glucose-Capillary: 289 mg/dL — ABNORMAL HIGH (ref 70–99)

## 2019-11-06 LAB — LIPASE, BLOOD: Lipase: 35 U/L (ref 11–51)

## 2019-11-06 LAB — BRAIN NATRIURETIC PEPTIDE: B Natriuretic Peptide: 173 pg/mL — ABNORMAL HIGH (ref 0.0–100.0)

## 2019-11-06 LAB — HIV ANTIBODY (ROUTINE TESTING W REFLEX): HIV Screen 4th Generation wRfx: NONREACTIVE

## 2019-11-06 MED ORDER — ONDANSETRON HCL 4 MG/2ML IJ SOLN: 4.0000 mg | Freq: Four times a day (QID) | INTRAMUSCULAR | Status: DC | PRN

## 2019-11-06 MED ORDER — IPRATROPIUM BROMIDE 0.06 % NA SOLN
2.0000 | Freq: Three times a day (TID) | NASAL | Status: DC
  Administered 2019-11-06 – 2019-11-08 (×7): 2 via NASAL
  Filled 2019-11-06: qty 15

## 2019-11-06 MED ORDER — SODIUM CHLORIDE 0.9% FLUSH: 3.0000 mL | INTRAVENOUS | Status: DC | PRN

## 2019-11-06 MED ORDER — INSULIN ASPART 100 UNIT/ML ~~LOC~~ SOLN
0.0000 [IU] | Freq: Three times a day (TID) | SUBCUTANEOUS | Status: DC
  Administered 2019-11-06: 11 [IU] via SUBCUTANEOUS
  Administered 2019-11-07: 3 [IU] via SUBCUTANEOUS
  Administered 2019-11-07 (×2): 8 [IU] via SUBCUTANEOUS
  Administered 2019-11-08: 5 [IU] via SUBCUTANEOUS
  Administered 2019-11-08: 8 [IU] via SUBCUTANEOUS
  Filled 2019-11-06 (×6): qty 1

## 2019-11-06 MED ORDER — LABETALOL HCL 100 MG PO TABS
100.0000 mg | ORAL_TABLET | Freq: Two times a day (BID) | ORAL | Status: DC
  Administered 2019-11-06 – 2019-11-08 (×6): 100 mg via ORAL
  Filled 2019-11-06 (×7): qty 1

## 2019-11-06 MED ORDER — LORATADINE 10 MG PO TABS
10.0000 mg | ORAL_TABLET | Freq: Every day | ORAL | Status: DC
  Administered 2019-11-06 – 2019-11-08 (×3): 10 mg via ORAL
  Filled 2019-11-06 (×3): qty 1

## 2019-11-06 MED ORDER — LIRAGLUTIDE 18 MG/3ML ~~LOC~~ SOPN: 0.6000 mg | PEN_INJECTOR | Freq: Every day | SUBCUTANEOUS | Status: DC

## 2019-11-06 MED ORDER — ALPRAZOLAM 0.25 MG PO TABS: 0.2500 mg | ORAL_TABLET | Freq: Two times a day (BID) | ORAL | Status: DC | PRN

## 2019-11-06 MED ORDER — INSULIN GLARGINE 100 UNIT/ML ~~LOC~~ SOLN
50.0000 [IU] | Freq: Every day | SUBCUTANEOUS | Status: DC
  Administered 2019-11-06 (×2): 50 [IU] via SUBCUTANEOUS
  Filled 2019-11-06 (×4): qty 0.5

## 2019-11-06 MED ORDER — ZOLPIDEM TARTRATE 5 MG PO TABS: 5.0000 mg | ORAL_TABLET | Freq: Every evening | ORAL | Status: DC | PRN

## 2019-11-06 MED ORDER — ALBUTEROL SULFATE (2.5 MG/3ML) 0.083% IN NEBU: 2.5000 mg | INHALATION_SOLUTION | Freq: Four times a day (QID) | RESPIRATORY_TRACT | Status: DC | PRN

## 2019-11-06 MED ORDER — FUROSEMIDE 10 MG/ML IJ SOLN
60.0000 mg | Freq: Two times a day (BID) | INTRAMUSCULAR | Status: DC
  Administered 2019-11-07 – 2019-11-08 (×3): 60 mg via INTRAVENOUS
  Filled 2019-11-06 (×3): qty 6

## 2019-11-06 MED ORDER — INSULIN ASPART 100 UNIT/ML ~~LOC~~ SOLN: 4.0000 [IU] | Freq: Three times a day (TID) | SUBCUTANEOUS | Status: DC

## 2019-11-06 MED ORDER — INSULIN ASPART 100 UNIT/ML ~~LOC~~ SOLN
0.0000 [IU] | Freq: Every day | SUBCUTANEOUS | Status: DC
  Administered 2019-11-06: 3 [IU] via SUBCUTANEOUS
  Administered 2019-11-07: 5 [IU] via SUBCUTANEOUS
  Filled 2019-11-06 (×2): qty 1

## 2019-11-06 MED ORDER — ASPIRIN EC 81 MG PO TBEC
81.0000 mg | DELAYED_RELEASE_TABLET | Freq: Every day | ORAL | Status: DC
  Administered 2019-11-06 – 2019-11-08 (×3): 81 mg via ORAL
  Filled 2019-11-06 (×3): qty 1

## 2019-11-06 MED ORDER — HYDRALAZINE HCL 50 MG PO TABS
100.0000 mg | ORAL_TABLET | Freq: Three times a day (TID) | ORAL | Status: DC
  Administered 2019-11-06 – 2019-11-08 (×6): 100 mg via ORAL
  Filled 2019-11-06 (×6): qty 2

## 2019-11-06 MED ORDER — SODIUM CHLORIDE 0.9% FLUSH
3.0000 mL | Freq: Two times a day (BID) | INTRAVENOUS | Status: DC
  Administered 2019-11-06 – 2019-11-08 (×5): 3 mL via INTRAVENOUS

## 2019-11-06 MED ORDER — ENOXAPARIN SODIUM 40 MG/0.4ML ~~LOC~~ SOLN
40.0000 mg | Freq: Two times a day (BID) | SUBCUTANEOUS | Status: DC
  Administered 2019-11-06 – 2019-11-08 (×5): 40 mg via SUBCUTANEOUS
  Filled 2019-11-06 (×5): qty 0.4

## 2019-11-06 MED ORDER — PREGABALIN 75 MG PO CAPS
75.0000 mg | ORAL_CAPSULE | Freq: Two times a day (BID) | ORAL | Status: DC
  Administered 2019-11-06 – 2019-11-08 (×6): 75 mg via ORAL
  Filled 2019-11-06 (×6): qty 1

## 2019-11-06 MED ORDER — ACETAMINOPHEN 325 MG PO TABS: 650.0000 mg | ORAL_TABLET | ORAL | Status: DC | PRN

## 2019-11-06 MED ORDER — FERROUS SULFATE 325 (65 FE) MG PO TABS
325.0000 mg | ORAL_TABLET | Freq: Two times a day (BID) | ORAL | Status: DC
  Administered 2019-11-06 – 2019-11-08 (×5): 325 mg via ORAL
  Filled 2019-11-06 (×7): qty 1

## 2019-11-06 MED ORDER — FUROSEMIDE 10 MG/ML IJ SOLN
60.0000 mg | Freq: Two times a day (BID) | INTRAMUSCULAR | Status: DC
  Administered 2019-11-06: 60 mg via INTRAVENOUS
  Filled 2019-11-06: qty 8

## 2019-11-06 MED ORDER — SODIUM CHLORIDE 0.9 % IV SOLN: 250.0000 mL | INTRAVENOUS | Status: DC | PRN

## 2019-11-06 MED ORDER — LISINOPRIL 10 MG PO TABS: 20.0000 mg | ORAL_TABLET | Freq: Every day | ORAL | Status: DC

## 2019-11-06 MED ORDER — AMLODIPINE BESYLATE 10 MG PO TABS
10.0000 mg | ORAL_TABLET | Freq: Every day | ORAL | Status: DC
  Administered 2019-11-06 – 2019-11-08 (×3): 10 mg via ORAL
  Filled 2019-11-06: qty 1
  Filled 2019-11-06: qty 2
  Filled 2019-11-06: qty 1

## 2019-11-06 MED ORDER — ATORVASTATIN CALCIUM 20 MG PO TABS
40.0000 mg | ORAL_TABLET | Freq: Every day | ORAL | Status: DC
  Administered 2019-11-06 – 2019-11-07 (×2): 40 mg via ORAL
  Filled 2019-11-06 (×2): qty 2

## 2019-11-06 NOTE — ED Notes (Signed)
O2 increased to 3L. Pt O2 88 while sleeping. O2 sat increased to 96.

## 2019-11-06 NOTE — ED Notes (Signed)
Ready bed @ 1743, patient going to room 252, spoke with RN Miquel Dunn.

## 2019-11-06 NOTE — ED Notes (Signed)
Pt states coming in for SOB. Pt states history of COPD. Pt states she had a cough on Sunday. Pt denies covid contacts. Pt is able to get up and walk to the restroom with 1 assist.

## 2019-11-06 NOTE — ED Notes (Signed)
Report given to Amy, RN.

## 2019-11-06 NOTE — ED Notes (Signed)
Lab called for blood draw.

## 2019-11-06 NOTE — H&P (Signed)
  [place] [facility]   PATIENT NAME: [name] [name]    MR#:  [id number]  DATE OF BIRTH:  11/15/1960  DATE OF ADMISSION:  11/05/2019  PRIMARY CARE PHYSICIAN: Newlin, Enobong, MD   REQUESTING/REFERRING PHYSICIAN: Forbach, Cory, MD CHIEF COMPLAINT:   Chief Complaint  Patient presents with  . Shortness of Breath  . Leg Swelling    HISTORY OF PRESENT ILLNESS:  [name]  is a 59 y.o. African-American female with a known history of diastolic CHF with EF 55 to 60%, type diabetes mellitus and hypertension, presented to the emergency room with acute onset of worsening dyspnea over the last 3 days with associated worsening bilateral lower extremity edema, cough productive of whitish sputum and wheezing.  She denied any fever or chills.  No chest pain or palpitations.  No dysuria, oliguria or hematuria or flank pain.  She has been taking her medications regularly.  No nausea or vomiting or abdominal pain.  Upon presentation to the emergency room respiratory rate was 24 and pulse symmetry was 80% on room air with otherwise normal vital signs.  Labs revealed a BNP of 473 and blood glucose of 292 with a BUN of 47 and creatinine 1.9 compared to 54 and 1.83 on 08/19/2019.  High-sensitivity troponin I was 10 and later 12.  CBC showed chronic anemia that is stable.  EKG showed normal sinus rhythm with rate of 79.  Chest x-ray showed no acute cardiopulmonary disease.  The patient was given 40 mg of IV Lasix.  She will be admitted to cardiac progressive unit bed for further evaluation and management. PAST MEDICAL HISTORY:   Past Medical History:  Diagnosis Date  . Anemia   . CHF (congestive heart failure) (HCC)   . Chronic hepatitis C without hepatic coma (HCC) 11/09/2016  . Diabetes mellitus   . Fibroids   . 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 Hypertension   . MRSA (methicillin resistant Staphylococcus aureus)   . Trichomonas   . VAGINITIS,  BACTERIAL, RECURRENT 08/15/2007   Qualifier: Diagnosis of  By: Rankins MD, Victoria      PAST SURGICAL HISTORY:   Past Surgical History:  Procedure Laterality Date  . BREAST BIOPSY Left 2018  . CESAREAN SECTION     breech    SOCIAL HISTORY:   Social History   Tobacco Use  . Smoking status: Never Smoker  . Smokeless tobacco: Never Used  Substance Use Topics  . Alcohol use: No    FAMILY HISTORY:   Family History  Problem Relation Age of Onset  . Colon cancer Mother   . Liver disease Sister   . Other Neg Hx   . Breast cancer Neg Hx   . Esophageal cancer Neg Hx   . Rectal cancer Neg Hx     DRUG ALLERGIES:   Allergies  Allergen Reactions  . Lisinopril Swelling    REVIEW OF SYSTEMS:   ROS As per history of present illness. All pertinent systems were reviewed above. Constitutional,  HEENT, cardiovascular, respiratory, GI, GU, musculoskeletal, neuro, psychiatric, endocrine,  integumentary and hematologic systems were reviewed and are otherwise  negative/unremarkable except for positive findings mentioned above in the HPI.   MEDICATIONS AT HOME:   Prior to Admission medications   Medication Sig Start Date End Date Taking? Authorizing Provider  albuterol (VENTOLIN HFA) 108 (90 Base) MCG/ACT inhaler Inhale 2 puffs into the lungs every 6 (six) hours as needed for   wheezing or shortness of breath. 04/23/19   Newlin, Enobong, MD  amLODipine (NORVASC) 10 MG tablet Take 1 tablet (10 mg total) by mouth daily. 07/10/19   Newlin, Enobong, MD  atorvastatin (LIPITOR) 40 MG tablet Take 1 tablet (40 mg total) by mouth daily. 07/10/19   Newlin, Enobong, MD  blood glucose meter kit and supplies KIT Dispense based on patient and insurance preference. Use up to four times daily as directed. (FOR ICD-9 250.00, 250.01). 04/12/19   Patel, Shreyang, MD  ferrous sulfate 325 (65 FE) MG tablet Take 1 tablet (325 mg total) by mouth 2 (two) times daily with a meal. 08/19/19 09/18/19  Williams,  Jamiese M, MD  furosemide (LASIX) 40 MG tablet Take 1 tablet (40 mg total) by mouth 2 (two) times daily. 07/10/19 07/09/20  Newlin, Enobong, MD  hydrALAZINE (APRESOLINE) 100 MG tablet Take 1 tablet (100 mg total) by mouth 3 (three) times daily. 07/10/19 07/09/20  Newlin, Enobong, MD  Insulin Glargine (LANTUS SOLOSTAR) 100 UNIT/ML Solostar Pen Inject 65 Units into the skin at bedtime. 07/10/19   Newlin, Enobong, MD  ipratropium (ATROVENT) 0.06 % nasal spray Place 2 sprays into both nostrils 3 (three) times daily. 08/19/19 09/18/19  Williams, Jamiese M, MD  labetalol (NORMODYNE) 100 MG tablet Take 1 tablet (100 mg total) by mouth 2 (two) times daily. 05/10/19   Hochrein, James, MD  liraglutide (VICTOZA) 18 MG/3ML SOPN Start 0.6mg SQ once a day for 7 days, then increase to 1.2mg once a day for 7 days, then increase to 1.8 mg daily thereafter 07/10/19   Newlin, Enobong, MD  lisinopril (ZESTRIL) 20 MG tablet Take 20 mg by mouth daily. 06/05/19   [provider]  loratadine (CLARITIN) 10 MG tablet Take 1 tablet (10 mg total) by mouth daily. 08/20/19 09/19/19  Williams, Jamiese M, MD  LYRICA 75 MG capsule Take 1 capsule (75 mg total) by mouth 2 (two) times daily. 06/29/19   Newlin, Enobong, MD  Misc. Devices MISC Rollator with Seat Shower Chair  Dx- CHF; Duration-lifetime. Weight 241 lbs 09/04/19   Newlin, Enobong, MD      VITAL SIGNS:  Blood pressure 138/66, pulse 81, temperature 98.2 F (36.8 C), resp. rate 15, height 5' 6" (1.676 m), weight 112.5 kg, last menstrual period 04/01/2010, SpO2 98 %.  PHYSICAL EXAMINATION:  Physical Exam  GENERAL:  59 y.o.-year-old African-American female patient lying in the bed with mild respiratory distress with conversational dyspnea. EYES: Pupils equal, round, reactive to light and accommodation. No scleral icterus. Extraocular muscles intact.  HEENT: Head atraumatic, normocephalic. Oropharynx and nasopharynx clear.  NECK:  Supple, no jugular venous distention.  No thyroid enlargement, no tenderness.  LUNGS: Slightly diminished bibasal breath sounds. CARDIOVASCULAR: Regular rate and rhythm, S1, S2 normal. No murmurs, rubs, or gallops.  ABDOMEN: Soft, nondistended, nontender. Bowel sounds present. No organomegaly or mass.  EXTREMITIES: 1-2+ blood lower extremity pitting edema, with no cyanosis, or clubbing.  NEUROLOGIC: Cranial nerves II through XII are intact. Muscle strength 5/5 in all extremities. Sensation intact. Gait not checked.  PSYCHIATRIC: The patient is alert and oriented x 3.  Normal affect and good eye contact. SKIN: No obvious rash, lesion, or ulcer.   LABORATORY PANEL:   CBC Recent Labs  Lab 11/05/19 1851  WBC 4.7  HGB 8.7*  HCT 27.1*  PLT 161   ------------------------------------------------------------------------------------------------------------------  Chemistries  Recent Labs  Lab 11/05/19 1851  NA 141  K 4.1  CL 110  CO2 24  GLUCOSE 292*    BUN 47*  CREATININE 1.91*  CALCIUM 8.5*  AST 11*  ALT 12  ALKPHOS 57  BILITOT 0.5   ------------------------------------------------------------------------------------------------------------------  Cardiac Enzymes No results for input(s): TROPONINI in the last 168 hours. ------------------------------------------------------------------------------------------------------------------  RADIOLOGY:  DG Chest 2 View  Result Date: 11/05/2019 CLINICAL DATA:  59 year old female with shortness of breath. EXAM: CHEST - 2 VIEW COMPARISON:  Chest radiograph dated 08/17/2019. FINDINGS: No focal consolidation, pleural effusion or pneumothorax. The cardiac silhouette is within normal limits. No acute osseous pathology. IMPRESSION: No active cardiopulmonary disease. Electronically Signed   By: Anner Crete M.D.   On: 11/05/2019 19:18      IMPRESSION AND PLAN:   1.  Acute on chronic diastolic CHF. -The patient will be admitted to a cardiac progressive unit bed. -She will  be diuresed with IV Lasix at a slightly higher dose than her home dose. -We will follow serial troponin I. -Her diastolic function could not be determined from previous echo in November that showed EF of 55 to 60% with left atrial mild dilatation.  We will therefore repeat an echo here. -Cardiology consult to be obtained. -I notified Dr. Harrington Challenger about the patient.  2.  Acute hypoxic respiratory failure secondary to acute CHF. -O2 protocol will be followed. -Pulse continue be followed with diuresis.  3.  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 -The patient will be placed on supplement coverage with NovoLog and will continue her basal coverage. -We will continue her Lyrica.  4.  Stage III b chronic kidney disease. -This is currently stable.  5.  Hypertension. -We will continue her antihypertensives.  6.  DVT prophylaxis. -Subcutaneous Lovenox.    All the records are reviewed and case discussed with ED provider. The plan of care was discussed in details with the patient (and family). I answered all questions. The patient agreed to proceed with the above mentioned plan. Further management will depend upon hospital course.   CODE STATUS: Full code  Status is: Inpatient  Remains inpatient appropriate because:Ongoing diagnostic testing needed not appropriate for outpatient work up, IV treatments appropriate due to intensity of illness or inability to take PO and Inpatient level of care appropriate due to severity of illness   Dispo: The patient is from: Home              Anticipated d/c is to: Home              Anticipated d/c date is: 2 days              Patient currently is not medically stable to d/c.  TOTAL TIME TAKING CARE OF THIS PATIENT: 50 minutes.    Christel Mormon M.D on 11/06/2019 at 12:56 AM  [facility]   From 7 PM-7 AM, contact night-coverage [email or web address]  CC: Primary care physician; [name],  MD   Note: This dictation was prepared with Dragon dictation along with smaller phrase technology. Any transcriptional errors that result from this process are unintentional.

## 2019-11-06 NOTE — Consult Note (Signed)
Cardiology Consultation:   Patient ID: Mccartney Brucks; [id number]; [birth year]   Admit date: 11/05/2019 Date of Consult: 11/06/2019  Primary Care Provider: [name], MD Primary Cardiologist: Hochrein   Patient Profile:   [name] [name] is a 59 y.o. female with a hx of HFpEF, anemia, hepatitis C, diabetes, CKD stage III, hypertensive heart disease, morbid obesity, sleep apnea and possible OHS, and medication nonadherence who is being seen today for the evaluation of acute on chronic HFpEF at the request of Dr. Sidney Ace.  History of Present Illness:   [name] [name] underwent echo in 01/2019 which showed mild systolic dysfunction with an EF of 50%, mild diffuse hypokinesis, moderate concentric LVH, diastolic dysfunction, elevated left atrial and left ventricular EDP, normal RV systolic function and cavity size, mildly dilated left atrium, mild aortic valve regurgitation.  In this setting, she was referred to cardiology and evaluated on 05/10/2019.    Initially, plans were for the patient to complete a PYP scan however, this was not completed at time of scheduling secondary to facial covering concerns.  She was subsequently admitted to the hospital in 05/2019 with acute on chronic diastolic CHF in the setting of dietary and medication nonadherence.  Echo at that time showed an EF of 55 to 60%, no LVH, indeterminate diastolic parameters, normal RV systolic function and cavity size, mildly dilated left atrium, mild mitral annular calcification with mild mitral stenosis.  Symptoms improved with diuresis.  She subsequently followed up with her primary cardiologist with no further cardiac imaging recommended given no LVH was noted on her echo.  She was admitted in 07/2019 with multiple complaints including conjunctivitis, and continued dyspnea felt to be possibly related to bronchitis.  She presented to the ED on 4/19 with a 2-week history of worsening lower extremity swelling and shortness of  breath.  Patient's boyfriend indicates that she frequently eats foods high in salt and is constantly snacking on chips.  She frequently misses her Lasix.  She reports her weight is up approximately 15 to 20 pounds from her baseline.  BP has ranged from the 664Q to 034V systolic.  Initially she was hypoxic with oxygen saturation of 88% on room air requiring supplemental oxygen via nasal cannula.  Chest x-ray showed no active cardiopulmonary disease.  BNP 173.  High-sensitivity troponin negative.  COVID-19 negative.  Hemoglobin low though stable at 8.7.  BUN 47 with serum creatinine 1.91.  In the ED she received 40 mg of IV Lasix.  Upon admission she was placed on IV Lasix 60 mg twice daily.  No documented urine output to date.   Past Medical History:  Diagnosis Date  . Anemia   . CHF (congestive heart failure) ([place])   . Chronic hepatitis C without hepatic coma ([place]) 11/09/2016  . Diabetes mellitus   . Fibroids   . 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 Hypertension   . MRSA (methicillin resistant Staphylococcus aureus)   . Trichomonas   . VAGINITIS, BACTERIAL, RECURRENT 08/15/2007   Qualifier: Diagnosis of  By: Radene Ou MD, [place]      Past Surgical History:  Procedure Laterality Date  . BREAST BIOPSY Left 2018  . CESAREAN SECTION     breech     Home Meds: Prior to Admission medications   Medication Sig Start Date End Date Taking? Authorizing Provider  albuterol (VENTOLIN HFA) 108 (90 Base) MCG/ACT inhaler Inhale 2 puffs into the lungs every 6 (six) hours as  needed for wheezing or shortness of breath. 04/23/19  Yes [name], MD  amLODipine (NORVASC) 10 MG tablet Take 1 tablet (10 mg total) by mouth daily. 07/10/19  Yes [name], MD  atorvastatin (LIPITOR) 40 MG tablet Take 1 tablet (40 mg total) by mouth daily. 07/10/19  Yes [name], MD  furosemide (LASIX) 40 MG tablet Take 1 tablet (40 mg total) by mouth 2 (two) times daily. 07/10/19  07/09/20 Yes [name], MD  Insulin Glargine (LANTUS SOLOSTAR) 100 UNIT/ML Solostar Pen Inject 65 Units into the skin at bedtime. 07/10/19  Yes [name], MD  labetalol (NORMODYNE) 100 MG tablet Take 1 tablet (100 mg total) by mouth 2 (two) times daily. 05/10/19  Yes Minus Breeding, MD  liraglutide (VICTOZA) 18 MG/3ML SOPN Start 0.81m SQ once a day for 7 days, then increase to 1.214monce a day for 7 days, then increase to 1.8 mg daily thereafter 07/10/19  Yes Newlin, Enobong, MD  LYRICA 75 MG capsule Take 1 capsule (75 mg total) by mouth 2 (two) times daily. 06/29/19  Yes NeCharlott RakesMD  penicillin v potassium (VEETID) 500 MG tablet Take 500 mg by mouth every 6 (six) hours. 10/31/19  Yes [provider]  blood glucose meter kit and supplies KIT Dispense based on patient and insurance preference. Use up to four times daily as directed. (FOR ICD-9 250.00, 250.01). 04/12/19   PaDustin FlockMD  ferrous sulfate 325 (65 FE) MG tablet Take 1 tablet (325 mg total) by mouth 2 (two) times daily with a meal. 08/19/19 09/18/19  WiWyvonnia DuskyMD  hydrALAZINE (APRESOLINE) 100 MG tablet Take 1 tablet (100 mg total) by mouth 3 (three) times daily. Patient not taking: Reported on 11/06/2019 07/10/19 07/09/20  NeCharlott RakesMD  ipratropium (ATROVENT) 0.06 % nasal spray Place 2 sprays into both nostrils 3 (three) times daily. 08/19/19 09/18/19  WiWyvonnia DuskyMD  loratadine (CLARITIN) 10 MG tablet Take 1 tablet (10 mg total) by mouth daily. 08/20/19 09/19/19  WiWyvonnia DuskyMD  Misc. Devices MISC Rollator with SeParamedicDx- CHF; Duration-lifetime. Weight 241 lbs 09/04/19   NeCharlott RakesMD    Inpatient Medications: Scheduled Meds: . amLODipine  10 mg Oral Daily  . aspirin EC  81 mg Oral Daily  . atorvastatin  40 mg Oral Daily  . enoxaparin (LOVENOX) injection  40 mg Subcutaneous BID  . ferrous sulfate  325 mg Oral BID WC  . furosemide  60 mg Intravenous Q12H  .  hydrALAZINE  100 mg Oral TID  . insulin glargine  50 Units Subcutaneous QHS  . ipratropium  2 spray Each Nare TID  . labetalol  100 mg Oral BID  . lisinopril  20 mg Oral Daily  . loratadine  10 mg Oral Daily  . pregabalin  75 mg Oral BID  . sodium chloride flush  3 mL Intravenous Once  . sodium chloride flush  3 mL Intravenous Q12H   Continuous Infusions: . sodium chloride     PRN Meds: sodium chloride, acetaminophen, albuterol, ALPRAZolam, ondansetron (ZOFRAN) IV, sodium chloride flush  Allergies:   Allergies  Allergen Reactions  . Lisinopril Swelling    Social History:   Social History   Socioeconomic History  . Marital status: Legally Separated    Spouse name: Not on file  . Number of children: Not on file  . Years of education: Not on file  . Highest education level: Not on file  Occupational History  .  Occupation: disability  Tobacco Use  . Smoking status: Never Smoker  . Smokeless tobacco: Never Used  Substance and Sexual Activity  . Alcohol use: No  . Drug use: No    Types: Cocaine, Marijuana    Comment: remote h/o cocaine and marijuana use  . Sexual activity: Yes    Birth control/protection: None  Other Topics Concern  . Not on file  Social History Narrative   Lives with friend, Rosalee Kaufman and nephew.  One daughter.    Social Determinants of Health   Financial Resource Strain:   . Difficulty of Paying Living Expenses:   Food Insecurity:   . Worried About Charity fundraiser in the Last Year:   . Arboriculturist in the Last Year:   Transportation Needs:   . Film/video editor (Medical):   Marland Kitchen Lack of Transportation (Non-Medical):   Physical Activity: Unknown  . Days of Exercise per Week: 0 days  . Minutes of Exercise per Session: Not on file  Stress: No Stress Concern Present  . Feeling of Stress : Not at all  Social Connections: Somewhat Isolated  . Frequency of Communication with Friends and Family: More than three times a week  . Frequency of  Social Gatherings with Friends and Family: More than three times a week  . Attends Religious Services: More than 4 times per year  . Active Member of Clubs or Organizations: No  . Attends Archivist Meetings: Not asked  . Marital Status: Separated  Intimate Partner Violence:   . Fear of Current or Ex-Partner:   . Emotionally Abused:   Marland Kitchen Physically Abused:   . Sexually Abused:      Family History:   Family History  Problem Relation Age of Onset  . Colon cancer Mother   . Liver disease Sister   . Other Neg Hx   . Breast cancer Neg Hx   . Esophageal cancer Neg Hx   . Rectal cancer Neg Hx     ROS:  Review of Systems  Constitutional: Positive for malaise/fatigue. Negative for chills, diaphoresis, fever and weight loss.  HENT: Negative for congestion.   Eyes: Positive for discharge. Negative for redness.  Respiratory: Positive for shortness of breath. Negative for cough, sputum production and wheezing.   Cardiovascular: Positive for leg swelling. Negative for chest pain, palpitations, orthopnea, claudication and PND.  Gastrointestinal: Negative for abdominal pain, heartburn, nausea and vomiting.  Musculoskeletal: Negative for falls and myalgias.  Skin: Negative for rash.  Neurological: Positive for weakness. Negative for dizziness, tingling, tremors, sensory change, speech change, focal weakness and loss of consciousness.  Endo/Heme/Allergies: Does not bruise/bleed easily.  Psychiatric/Behavioral: Negative for substance abuse. The patient is not nervous/anxious.   All other systems reviewed and are negative.     Physical Exam/Data:   Vitals:   11/06/19 0430 11/06/19 0500 11/06/19 0530 11/06/19 0600  BP: 133/68 (!) 142/80 138/67 134/80  Pulse:      Resp: _0 Temp:      SpO2:      Weight:      Height:       No intake or output data in the 24 hours ending 11/06/19 0813 Filed Weights   11/05/19 1848  Weight: 112.5 kg   Body mass index is 40.03 kg/m.     Physical Exam: General: Well developed, well nourished, in no acute distress. Head: Normocephalic, atraumatic, sclera non-icteric, no xanthomas, nares without discharge.  Neck: Negative for carotid bruits.  JVD difficult to assess secondary to body habitus. Lungs: Clear bilaterally to auscultation without wheezes, rales, or rhonchi. Breathing is unlabored. Heart: RRR with S1 S2. No murmurs, rubs, or gallops appreciated. Abdomen: Soft, non-tender, non-distended with normoactive bowel sounds. No hepatomegaly. No rebound/guarding. No obvious abdominal masses. Msk:  Strength and tone appear normal for age. Extremities: No clubbing or cyanosis. 1-2+ bilateral lower extremity pitting edema. Distal pedal pulses are 2+ and equal bilaterally. Neuro: Alert and oriented X 3. No facial asymmetry. No focal deficit. Moves all extremities spontaneously. Psych:  Responds to questions appropriately with a normal affect.   EKG:  The EKG was personally reviewed and demonstrates: NSR, 79 bpm, no acute st/t changes  Telemetry:  Telemetry was personally reviewed and demonstrates: SR  Weights: [employer]   11/05/19 1848  Weight: 112.5 kg    Relevant CV Studies: 2D echo 05/2019: 1. Left ventricular ejection fraction, by visual estimation, is 55 to  60%. The left ventricle has normal function. There is no left ventricular  hypertrophy.  2. Left ventricular diastolic parameters are indeterminate.  3. Global right ventricle has normal systolic function.The right  ventricular size is normal. No increase in right ventricular wall  thickness.  4. Left atrial size was mildly dilated.  5. Mild mitral annular calcification.  6. The mitral valve was not well visualized. No evidence of mitral valve  regurgitation. Mild mitral stenosis.  7. MV peak gradient, 8.6 mmHg.  8. The tricuspid valve is normal in structure. Tricuspid valve  regurgitation is not demonstrated.  9. The aortic valve is normal in  structure. Aortic valve regurgitation is  not visualized. Mild aortic valve sclerosis without stenosis.  10. TR signal is inadequate for assessing pulmonary artery systolic  pressure.  11. The inferior vena cava is normal in size with greater than 50%  respiratory variability, suggesting right atrial pressure of 3 mmHg.   Laboratory Data:  Chemistry Recent Labs  Lab 11/05/19 1851  NA 141  K 4.1  CL 110  CO2 24  GLUCOSE 292*  BUN 47*  CREATININE 1.91*  CALCIUM 8.5*  GFRNONAA 28*  GFRAA 33*  ANIONGAP 7    Recent Labs  Lab 11/05/19 1851  PROT 6.9  ALBUMIN 3.6  AST 11*  ALT 12  ALKPHOS 57  BILITOT 0.5   Hematology Recent Labs  Lab 11/05/19 1851  WBC 4.7  RBC 3.10*  HGB 8.7*  HCT 27.1*  MCV 87.4  MCH 28.1  MCHC 32.1  RDW 13.7  PLT 161   Cardiac EnzymesNo results for input(s): TROPONINI in the last 168 hours. No results for input(s): TROPIPOC in the last 168 hours.  BNP Recent Labs  Lab 11/05/19 1851  BNP 173.0*    DDimer No results for input(s): DDIMER in the last 168 hours.  Radiology/Studies:  DG Chest 2 View  Result Date: 11/05/2019 IMPRESSION: No active cardiopulmonary disease. Electronically Signed   By: Anner Crete M.D.   On: 11/05/2019 19:18    Assessment and Plan:   1. Acute on chronic HFpEF: -Exacerbated by dietary and medication noncompliance -She is approximately 15 to 20 pounds volume up - -CHF education was discussed in detail -Daily weights with strict I's and O's  2. Acute hypoxic respiratory distress: -Likely multifactorial including underlying pulmonary disease, acute on chronic HFpEF, anemia with hemoglobin of 8.7, physical deconditioning with morbid obesity and sleep apnea with possible OHS -Consider further work-up given her degree of hypoxia on room air at the discretion of internal  medicine -Wean supplemental oxygen as able  3.  CKD stage III: -Possibly exacerbated in the setting of vascular congestion -Monitor with  diuresis  4.  Anemia: -Possibly in the setting of chronic disease -Hemoglobin low though stable -Management per internal medicine -Exacerbating her overall presentation -Cannot exclude some degree of third spacing  5.  Obesity with sleep apnea and possible OHS: -Weight loss advised -CPAP -Outpatient follow-up with PCP  6.  HTN: -Suboptimally controlled in the ED -IV diuresis as above -    For questions or updates, please contact [place] Please consult [email or web address] for contact info under Cardiology/STEMI.   Signed, Christell Faith, PA-C [place] Pager: [phone] 11/06/2019, 8:13 AM

## 2019-11-06 NOTE — TOC Initial Note (Signed)
Transition of Care [facility]) - Initial/Assessment Note    Patient Details  Name: [name] [name] MRN: [id number] Date of Birth: 23-Dec-1960  [facility] CM/SW Contact:    Anselm Pancoast, RN Phone Number: 11/06/2019, 11:03 AM  Clinical Narrative:                 Spoke to daughter, [name] [name], as patient is requiring increased Oxygen and not able to complete assessment. Daughter states patient lives with a significant other who is available to provide assistance as needed. Patient is independent with all ADLs and has no DME needs. Patient has staircase in her home however has main living area including bathroom on the main floor. Daughter states patient does not use Oxygen at home but does have an inhaler. No current needs or concerns at this time. Patient will return home at discharge.   Expected Discharge Plan: Home/Self Care     Patient Goals and CMS Choice Patient states their goals for this hospitalization and ongoing recovery are:: Get back home      Expected Discharge Plan and Services Expected Discharge Plan: Home/Self Care In-house Referral: Clinical Social Work     Living arrangements for the past 2 months: Single Family Home                                      Prior Living Arrangements/Services Living arrangements for the past 2 months: Single Family Home Lives with:: Significant Other Patient language and need for interpreter reviewed:: Yes Do you feel safe going back to the place where you live?: Yes      Need for Family Participation in Patient Care: Yes (Comment) Care giver support system in place?: Yes (comment)   Criminal Activity/Legal Involvement Pertinent to Current Situation/Hospitalization: No - Comment as needed  Activities of Daily Living      Permission Sought/Granted Permission sought to share information with : Case Manager Permission granted to share information with : Yes, Verbal Permission Granted  Share Information with NAME:  TOC Unit           Emotional Assessment Appearance:: Appears stated age Attitude/Demeanor/Rapport: Engaged Affect (typically observed): Accepting Orientation: : Oriented to Self, Oriented to Place, Oriented to  Time, Oriented to Situation Alcohol / Substance Use: Never Used Psych Involvement: No (comment)  Admission diagnosis:  Acute on chronic diastolic CHF (congestive heart failure) ([place]) [I50.33] Patient Active Problem List   Diagnosis Date Noted  . Acute on chronic diastolic CHF (congestive heart failure) ([place]) 11/06/2019  . COPD with acute bronchitis ([place])   . Acute kidney failure ([place]) 08/18/2019  . Left ventricular hypertrophy 07/21/2019  . Educated about COVID-19 virus infection 07/21/2019  . Acute on chronic diastolic heart failure ([place])   . Acute CHF (congestive heart failure) ([place]) 06/03/2019  . Hypoxia   . Dyspnea on exertion 05/09/2019  . Sleep apnea 05/09/2019  . Hyperglycemia 04/09/2019  . Snoring 03/13/2019  . Symptomatic anemia 08/17/2018  . Acute renal failure (ARF) ([place]) 08/17/2018  . Chronic cystitis 08/03/2018  . Diabetic neuropathy ([place]) 06/16/2017  . Non compliance w medication regimen 04/12/2017  . Family history of colon cancer in mother 11/17/2016  . Dyspareunia in female 11/11/2016  . Screen for colon cancer 11/11/2016  . History of ovarian cyst 11/11/2016  . Chronic hepatitis C without hepatic coma ([place]) 11/09/2016  . Hyperlipidemia 10/07/2016  . Type 2 diabetes mellitus ([place]) 01/29/2014  .  BREAST PAIN, BILATERAL 02/19/2010  . ANEMIA, IRON DEFICIENCY 10/03/2009  . LEUKOPENIA, MILD 09/19/2009  . Chest pain 09/19/2009  . LIPOMA 06/18/2009  . 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 TINEA PEDIS 05/07/2009  . SKIN LESION 05/07/2009  . COUGH 05/07/2009  . SUBSTANCE ABUSE, MULTIPLE 02/22/2007  . Essential hypertension 02/22/2007   PCP:  [name], MD Pharmacy:   [facility] ([place]), [place] - [address] [place] ([place]) [place] [address] Phone: [phone] Fax: [phone]     Social Determinants of Health (SDOH) Interventions    Readmission Risk Interventions Readmission Risk Prevention Plan 08/18/2019 06/05/2019 04/12/2019  Transportation Screening Complete Complete Complete  PCP or Specialist Appt within 3-5 Days - - (No Data)  Palliative Care Screening - - Not Applicable  Medication Review (RN Care Manager) Complete Complete Complete  PCP or Specialist appointment within 3-5 days of discharge Complete Complete -  [place] or Home Care Consult - Complete -  SW Recovery Care/Counseling Consult Complete Complete -  Palliative Care Screening Not Applicable Not Applicable -  [place] Not Applicable Not Applicable -  Some recent data might be hidden

## 2019-11-06 NOTE — Progress Notes (Signed)
PROGRESS NOTE    [name] [name]  KDX:[id number] DOB: 11-Jan-1961 DOA: 11/05/2019 PCP: [name], MD   Chief Complaint  Patient presents with  . Shortness of Breath  . Leg Swelling    Brief Narrative:  Admission H&P from earlier this morning reviewed in detail.  59 year old female with history of diastolic CHF, uncontrolled type 2 diabetes mellitus, hypertension, CKD stage III (B), hepatitis C, sleep apnea and medication nonadherence presented to the ED with 3-day history of worsening shortness of breath associated with bilateral leg swelling, productive cough of whitish sputum and wheezing. In the ED O2 sat dropped to 80% on room air, BNP of 473 and blood glucose of 292. Admitted for acute on chronic diastolic CHF.  Assessment & Plan:   Principal problem Acute on chronic diastolic CHF ([place]) Placed on IV Lasix 60 mg every 12 hours.  Weight almost 20 pounds from baseline.  Still requiring 3 L via nasal cannula.  Continue strict I's/O and daily weight. Follow 2D echo.  Cardiology consult appreciated.  Acute respiratory failure with hypoxia (HCC) Combination of CHF and possible underlying OHS.  Continue supplemental oxygen and wean as tolerated. Needs outpatient sleep study.  Chronic kidney disease stage IIIb Likely diabetic nephropathy.  Renal function at baseline.  Lab from today pending.  Monitor while on diuretic.  Anemia of chronic disease H&H stable.  Monitor for now.  Morbid obesity with?  [facility] Out[facility] sleep [facility].  Uncontrolled type 2 diabetes mellitus with hyperglycemia with chronic kidney disease and peripheral neuropathy. Continue basal insulin and NovoLog sliding scale coverage.  Continue Lyrica.  Essential hypertension Continue home meds  Medication nonadherence Needs counseling.   DVT prophylaxis: Lovenox Code Status: Full code Family Communication: None Disposition: Home possibly in the next 48 hours depending upon improvement of her CHF and  acute respiratory failure.  Status is: Inpatient  Remains inpatient appropriate because:IV treatments appropriate due to intensity of illness or inability to take PO.  Needs IV diuresis with Lasix and persistent hypoxic respiratory failure.   Dispo: The patient is from: Home              Anticipated d/c is to: Home              Anticipated d/c date is: 2 days              Patient currently is not medically stable to d/c.        Consultants:  Cardiology Procedures:  2D echo   Subjective: Reports breathing slightly improved since admission.  Still requiring 3 to wear nasal cannula.  Objective: Vitals:   11/06/19 1000 11/06/19 1100 11/06/19 1200 11/06/19 1230  BP: (!) 153/86 125/73 (!) 160/105 124/73  Pulse: 78 76 75 81  Resp: 12 15 15 18   Temp:      TempSrc:      SpO2: 91% 92% 92% 92%  Weight:      Height:       No intake or output data in the 24 hours ending 11/06/19 1349 Filed Weights   11/05/19 1848  Weight: 112.5 kg    Examination:  General: Middle-aged with obese female appears fatigued, not in distress HEENT: Pallor present, moist mucosa, supple neck Chest: Fine bibasilar crackles, few scattered rhonchi CVS: Normal S1-S2, no murmurs GI: Soft, nondistended, nontender Musculoskeletal: Warm, 1+ pitting edema bilaterally   Data Reviewed: I have personally reviewed following labs and imaging studies  CBC: Recent Labs  Lab 11/05/19 1851  WBC 4.7  HGB  8.7*  HCT 27.1*  MCV 87.4  PLT 527    Basic Metabolic Panel: Recent Labs  Lab 11/05/19 1851  NA 141  K 4.1  CL 110  CO2 24  GLUCOSE 292*  BUN 47*  CREATININE 1.91*  CALCIUM 8.5*    GFR: Estimated Creatinine Clearance: 40.9 mL/min (A) (by C-G formula based on SCr of 1.91 mg/dL (H)).  Liver Function Tests: Recent Labs  Lab 11/05/19 1851  AST 11*  ALT 12  ALKPHOS 57  BILITOT 0.5  PROT 6.9  ALBUMIN 3.6    CBG: No results for input(s): GLUCAP in the last 168 hours.   Recent  Results (from the past 240 hour(s))  Respiratory Panel by RT PCR (Flu A&B, Covid) - Nasopharyngeal Swab     Status: None   Collection Time: 11/06/19  1:26 AM   Specimen: Nasopharyngeal Swab  Result Value Ref Range Status   SARS Coronavirus 2 by RT PCR NEGATIVE NEGATIVE Final    Comment: (NOTE) SARS-CoV-2 target nucleic acids are NOT DETECTED. The SARS-CoV-2 RNA is generally detectable in upper respiratoy specimens during the acute phase of infection. The lowest concentration of SARS-CoV-2 viral copies this assay can detect is 131 copies/mL. A negative result does not preclude SARS-Cov-2 infection and should not be used as the sole basis for treatment or other patient management decisions. A negative result may occur with  improper specimen collection/handling, submission of specimen other than nasopharyngeal swab, presence of viral mutation(s) within the areas targeted by this assay, and inadequate number of viral copies (<131 copies/mL). A negative result must be combined with clinical observations, patient history, and epidemiological information. The expected result is Negative. Fact Sheet for Patients:  PinkCheek.be Fact Sheet for Healthcare Providers:  GravelBags.it This test is not yet ap proved or cleared by the [place] FDA and  has been authorized for detection and/or diagnosis of SARS-CoV-2 by FDA under an Emergency Use Authorization (EUA). This EUA will remain  in effect (meaning this test can be used) for the duration of the COVID-19 declaration under Section 564(b)(1) of the Act, 21 U.S.C. section 360bbb-3(b)(1), unless the authorization is terminated or revoked sooner.    Influenza A by PCR NEGATIVE NEGATIVE Final   Influenza B by PCR NEGATIVE NEGATIVE Final    Comment: (NOTE) The Xpert Xpress SARS-CoV-2/FLU/RSV assay is intended as an aid in  the diagnosis of influenza from Nasopharyngeal swab specimens  and  should not be used as a sole basis for treatment. Nasal washings and  aspirates are unacceptable for Xpert Xpress SARS-CoV-2/FLU/RSV  testing. Fact Sheet for Patients: PinkCheek.be Fact Sheet for Healthcare Providers: GravelBags.it This test is not yet approved or cleared by the [place] FDA and  has been authorized for detection and/or diagnosis of SARS-CoV-2 by  FDA under an Emergency Use Authorization (EUA). This EUA will remain  in effect (meaning this test can be used) for the duration of the  Covid-19 declaration under Section 564(b)(1) of the Act, 21  U.S.C. section 360bbb-3(b)(1), unless the authorization is  terminated or revoked. Performed at [facility], [address] [place], [place] [address]          Radiology Studies: DG Chest 2 View  Result Date: 11/05/2019 CLINICAL DATA:  59 year old female with shortness of breath. EXAM: CHEST - 2 VIEW COMPARISON:  Chest radiograph dated 08/17/2019. FINDINGS: No focal consolidation, pleural effusion or pneumothorax. The cardiac silhouette is within normal limits. No acute osseous pathology. IMPRESSION: No active cardiopulmonary disease.  Electronically Signed   By: Anner Crete M.D.   On: 11/05/2019 19:18        Scheduled Meds: . amLODipine  10 mg Oral Daily  . aspirin EC  81 mg Oral Daily  . atorvastatin  40 mg Oral Daily  . enoxaparin (LOVENOX) injection  40 mg Subcutaneous BID  . ferrous sulfate  325 mg Oral BID WC  . furosemide  60 mg Intravenous Q12H  . hydrALAZINE  100 mg Oral TID  . insulin glargine  50 Units Subcutaneous QHS  . ipratropium  2 spray Each Nare TID  . labetalol  100 mg Oral BID  . loratadine  10 mg Oral Daily  . pregabalin  75 mg Oral BID  . sodium chloride flush  3 mL Intravenous Once  . sodium chloride flush  3 mL Intravenous Q12H   Continuous Infusions: . sodium chloride       LOS: 0 days    Time spent:  20 minutes    Elba Schaber, MD [facility]   To contact the attending provider between 7A-7P or the covering provider during after hours 7P-7A, please log into the web site [email or web address] and access using universal [place] password for that web site. If you do not have the password, please call the hospital operator.  11/06/2019, 1:49 PM

## 2019-11-06 NOTE — ED Notes (Signed)
Patient denies pain and is resting comfortably.  

## 2019-11-07 ENCOUNTER — Inpatient Hospital Stay (HOSPITAL_COMMUNITY)
Admit: 2019-11-07 | Discharge: 2019-11-07 | Disposition: A | Discharge status: Home / Self Care | Payer: Medicaid Other | Attending: Family Medicine | Admitting: Family Medicine

## 2019-11-07 ENCOUNTER — Inpatient Hospital Stay (HOSPITAL_COMMUNITY)
Admission: RE | Admit: 2019-11-07 | Discharge: 2019-11-07 | Disposition: A | Discharge status: Home / Self Care | Payer: Medicaid Other | Source: Ambulatory Visit | Attending: Nephrology | Admitting: Nephrology

## 2019-11-07 ENCOUNTER — Encounter (HOSPITAL_COMMUNITY): Payer: Self-pay

## 2019-11-07 DIAGNOSIS — I5031 Acute diastolic (congestive) heart failure: Secondary | ICD-10-CM

## 2019-11-07 LAB — BASIC METABOLIC PANEL
Anion gap: 7 (ref 5–15)
BUN: 45 mg/dL — ABNORMAL HIGH (ref 6–20)
CO2: 25 mmol/L (ref 22–32)
Calcium: 8.6 mg/dL — ABNORMAL LOW (ref 8.9–10.3)
Chloride: 108 mmol/L (ref 98–111)
Creatinine, Ser: 1.65 mg/dL — ABNORMAL HIGH (ref 0.44–1.00)
GFR calc Af Amer: 39 mL/min — ABNORMAL LOW (ref >60)
GFR calc non Af Amer: 34 mL/min — ABNORMAL LOW (ref >60)
Glucose, Bld: 241 mg/dL — ABNORMAL HIGH (ref 70–99)
Potassium: 4 mmol/L (ref 3.5–5.1)
Sodium: 140 mmol/L (ref 135–145)

## 2019-11-07 LAB — ECHOCARDIOGRAM COMPLETE
Height: 66 in
Weight: 4025.6 oz

## 2019-11-07 LAB — GLUCOSE, CAPILLARY
Glucose-Capillary: 177 mg/dL — ABNORMAL HIGH (ref 70–99)
Glucose-Capillary: 258 mg/dL — ABNORMAL HIGH (ref 70–99)
Glucose-Capillary: 296 mg/dL — ABNORMAL HIGH (ref 70–99)
Glucose-Capillary: 354 mg/dL — ABNORMAL HIGH (ref 70–99)

## 2019-11-07 MED ORDER — INSULIN GLARGINE 100 UNIT/ML ~~LOC~~ SOLN
55.0000 [IU] | Freq: Every day | SUBCUTANEOUS | Status: DC
  Administered 2019-11-07: 55 [IU] via SUBCUTANEOUS
  Filled 2019-11-07 (×2): qty 0.55

## 2019-11-07 MED ORDER — LIVING WELL WITH DIABETES BOOK
Freq: Once | Status: AC
  Filled 2019-11-07 (×2): qty 1

## 2019-11-07 MED ORDER — INSULIN ASPART 100 UNIT/ML ~~LOC~~ SOLN
3.0000 [IU] | Freq: Three times a day (TID) | SUBCUTANEOUS | Status: DC
  Administered 2019-11-07: 3 [IU] via SUBCUTANEOUS
  Filled 2019-11-07: qty 1

## 2019-11-07 NOTE — Progress Notes (Signed)
Pt ambulatory around unit. O2 saturation 94-96% on room air. Pt tolerated activity well with no acute distress.

## 2019-11-07 NOTE — Progress Notes (Signed)
[place] [facility] [place] NAME: [name] [name]    MR#:  [id number]  DATE OF BIRTH:  05-03-1961  SUBJECTIVE:  patient feeling a lot better with shortness of breath. She has had good urine output. Currently sats are 91% on room air. Denies chest pain.  REVIEW OF SYSTEMS:   Review of Systems  Constitutional: Negative for chills, fever and weight loss.  HENT: Negative for ear discharge, ear pain and nosebleeds.   Eyes: Negative for blurred vision, pain and discharge.  Respiratory: Positive for shortness of breath. Negative for sputum production, wheezing and stridor.   Cardiovascular: Negative for chest pain, palpitations, orthopnea and PND.  Gastrointestinal: Negative for abdominal pain, diarrhea, nausea and vomiting.  Genitourinary: Negative for frequency and urgency.  Musculoskeletal: Negative for back pain and joint pain.  Neurological: Negative for sensory change, speech change, focal weakness and weakness.  Psychiatric/Behavioral: Negative for depression and hallucinations. The patient is not nervous/anxious.    Tolerating Diet:yes Tolerating PT: self ambulatory  DRUG ALLERGIES:   Allergies  Allergen Reactions  . Lisinopril Swelling    VITALS:  Blood pressure (!) 107/57, pulse 76, temperature 98.5 F (36.9 C), temperature source Oral, resp. rate (!) 22, height 5\' 6"  (1.676 m), weight 114.1 kg, last menstrual period 04/01/2010, SpO2 91 %.  PHYSICAL EXAMINATION:   Physical Exam  GENERAL:  59 y.o.-year-old patient lying in the bed with no acute distress. Obese EYES: Pupils equal, round, reactive to light and accommodation. No scleral icterus.   HEENT: Head atraumatic, normocephalic. Oropharynx and nasopharynx clear.  NECK:  Supple, no jugular venous distention. No thyroid enlargement, no tenderness.  LUNGS: Normal breath sounds bilaterally, no wheezing, rales, rhonchi. No use of accessory muscles of respiration.  CARDIOVASCULAR: S1,  S2 normal. No murmurs, rubs, or gallops.  ABDOMEN: Soft, nontender, nondistended. Bowel sounds present. No organomegaly or mass.  EXTREMITIES: No cyanosis, clubbing  + edema b/l.   Skin wrinkling+ NEUROLOGIC: Cranial nerves II through XII are intact. No focal Motor or sensory deficits b/l.   PSYCHIATRIC:  patient is alert and oriented x 3.  SKIN: No obvious rash, lesion, or ulcer.   LABORATORY PANEL:  CBC Recent Labs  Lab 11/05/19 1851  WBC 4.7  HGB 8.7*  HCT 27.1*  PLT 161    Chemistries  Recent Labs  Lab 11/05/19 1851 11/06/19 0700 11/07/19 0528  NA 141   < > 140  K 4.1   < > 4.0  CL 110   < > 108  CO2 24   < > 25  GLUCOSE 292*   < > 241*  BUN 47*   < > 45*  CREATININE 1.91*   < > 1.65*  CALCIUM 8.5*   < > 8.6*  AST 11*  --   --   ALT 12  --   --   ALKPHOS 57  --   --   BILITOT 0.5  --   --    < > = values in this interval not displayed.   Cardiac Enzymes No results for input(s): TROPONINI in the last 168 hours. RADIOLOGY:  DG Chest 2 View  Result Date: 11/05/2019 CLINICAL DATA:  59 year old female with shortness of breath. EXAM: CHEST - 2 VIEW COMPARISON:  Chest radiograph dated 08/17/2019. FINDINGS: No focal consolidation, pleural effusion or pneumothorax. The cardiac silhouette is within normal limits. No acute osseous pathology. IMPRESSION: No active cardiopulmonary disease. Electronically Signed   By: Laren Everts.D.  On: 11/05/2019 19:18   ASSESSMENT AND PLAN:  59 year old female with history of diastolic CHF, uncontrolled type 2 diabetes mellitus, hypertension, CKD stage III (B), hepatitis C, sleep apnea and medication nonadherence presented to the ED with 3-day history of worsening shortness of breath associated with bilateral leg swelling, productive cough of whitish sputum and wheezing.  Acute on chronic diastolic CHF (HCC) -Placed on IV Lasix 60 mg every 12 hours.  - Weight almost 20 pounds from baseline. -  now on RA - Continue strict I's/O  and daily weight. -Follow 2D echo.  - Cardiology consult with Dr Garen Lah appreciated.  Acute respiratory failure with hypoxia (HCC) -Combination of CHF and possible underlying OHS.   -Continue supplemental oxygen and wean as tolerated. -Needs outpatient sleep study. -will do overnite Pulse oximetry tonite  Chronic kidney disease stage IIIb Likely diabetic nephropathy.  Renal function at baseline.    Monitor while on diuretic. -creat 1.6  Anemia of chronic disease H&H stable.  Monitor for now.  Morbid obesity with?  [facility] Outpatient sleep study.  Uncontrolled type 2 diabetes mellitus with hyperglycemia with chronic kidney disease and peripheral neuropathy. -Continue basal insulin and NovoLog sliding scale coverage.   -Continue Lyrica.  Essential hypertension Continue home meds  Medication nonadherence Needs counseling.   DVT prophylaxis: Lovenox Code Status: Full code Family Communication: dter [name] on the phone Disposition: Home possibly in the next 24hours depending upon improvement of her CHF and acute respiratory failure.  Status is: Inpatient  Remains inpatient appropriate because:IV treatments appropriate due to intensity of illness or inability to take PO.  Needs IV diuresis with Lasix and persistent hypoxic respiratory failure. Dispo: The patient is from: Home  Anticipated d/c is to: Home  Anticipated d/c date is: 1 days  Patient currently is not medically stable to d/c.  TOTAL TIME TAKING CARE OF THIS PATIENT: *30* minutes.  >50% time spent on counselling and coordination of care  Note: This dictation was prepared with Dragon dictation along with smaller phrase technology. Any transcriptional errors that result from this process are unintentional.  Fritzi Mandes M.D    [facility]   CC: Primary care physician; [name], MDPatient ID: [name] [name], female   DOB: Aug 15, 1960, 59 y.o.   MRN:  [id number]

## 2019-11-07 NOTE — Progress Notes (Signed)
Progress Note  Patient Name: [name] [name] Date of Encounter: 11/07/2019  Primary Cardiologist: Minus Breeding, MD   Subjective   Patient seen this morning sleeping comfortably.  She states her shortness of breath has improved and edema has also improved.  No acute events overnight.  Inpatient Medications    Scheduled Meds: . amLODipine  10 mg Oral Daily  . aspirin EC  81 mg Oral Daily  . atorvastatin  40 mg Oral Daily  . enoxaparin (LOVENOX) injection  40 mg Subcutaneous BID  . ferrous sulfate  325 mg Oral BID WC  . furosemide  60 mg Intravenous Q12H  . hydrALAZINE  100 mg Oral TID  . insulin aspart  0-15 Units Subcutaneous TID WC  . insulin aspart  0-5 Units Subcutaneous QHS  . insulin glargine  50 Units Subcutaneous QHS  . ipratropium  2 spray Each Nare TID  . labetalol  100 mg Oral BID  . loratadine  10 mg Oral Daily  . pregabalin  75 mg Oral BID  . sodium chloride flush  3 mL Intravenous Once  . sodium chloride flush  3 mL Intravenous Q12H   Continuous Infusions: . sodium chloride     PRN Meds: sodium chloride, acetaminophen, albuterol, ALPRAZolam, ondansetron (ZOFRAN) IV, sodium chloride flush   Vital Signs    Vitals:   11/07/19 0450 11/07/19 0451 11/07/19 0549 11/07/19 0746  BP: 139/61 139/61  (!) 145/77  Pulse: 78 77  77  Resp: 19 19  18   Temp: 98.3 F (36.8 C) 98.3 F (36.8 C)  98 F (36.7 C)  TempSrc: Oral Oral  Oral  SpO2: 94% 95%  99%  Weight:   114.1 kg   Height:        Intake/Output Summary (Last 24 hours) at 11/07/2019 0829 Last data filed at 11/07/2019 0455 Gross per 24 hour  Intake 240 ml  Output 1900 ml  Net -1660 ml   Last 3 Weights 11/07/2019 11/06/2019 11/05/2019  Weight (lbs) 251 lb 9.6 oz 252 lb 248 lb  Weight (kg) 114.125 kg 114.306 kg 112.492 kg      Telemetry    Sinus rhythm- Personally Reviewed  ECG    No new EKG obtained- Personally Reviewed  Physical Exam   GEN: No acute distress.   Neck:  Unable to assess due to  body habitus Cardiac: RRR, no murmurs, rubs, or gallops.  Respiratory:  Decreased breath sounds at bases, poor inspiratory effort. GI: Soft, nontender, distended  MS: 1+ edema; No deformity. Neuro:  Nonfocal  Psych: Normal affect   Labs    High Sensitivity Troponin:   Recent Labs  Lab 11/05/19 1851 11/06/19 0001  TROPONINIHS 10 12      Chemistry Recent Labs  Lab 11/05/19 1851 11/06/19 0700 11/07/19 0528  NA 141 140 140  K 4.1 4.1 4.0  CL 110 110 108  CO2 24 23 25   GLUCOSE 292* 240* 241*  BUN 47* 46* 45*  CREATININE 1.91* 1.70* 1.65*  CALCIUM 8.5* 8.5* 8.6*  PROT 6.9  --   --   ALBUMIN 3.6  --   --   AST 11*  --   --   ALT 12  --   --   ALKPHOS 57  --   --   BILITOT 0.5  --   --   GFRNONAA 28* 33* 34*  GFRAA 33* 38* 39*  ANIONGAP 7 7 7      Hematology Recent Labs  Lab 11/05/19 1851  WBC  4.7  RBC 3.10*  HGB 8.7*  HCT 27.1*  MCV 87.4  MCH 28.1  MCHC 32.1  RDW 13.7  PLT 161    BNP Recent Labs  Lab 11/05/19 1851  BNP 173.0*     DDimer No results for input(s): DDIMER in the last 168 hours.   Radiology    DG Chest 2 View  Result Date: 11/05/2019 CLINICAL DATA:  59 year old female with shortness of breath. EXAM: CHEST - 2 VIEW COMPARISON:  Chest radiograph dated 08/17/2019. FINDINGS: No focal consolidation, pleural effusion or pneumothorax. The cardiac silhouette is within normal limits. No acute osseous pathology. IMPRESSION: No active cardiopulmonary disease. Electronically Signed   By: Anner Crete M.D.   On: 11/05/2019 19:18    Cardiac Studies   TTE 05/2019 1. Left ventricular ejection fraction, by visual estimation, is 55 to  60%. The left ventricle has normal function. There is no left ventricular  hypertrophy.  2. Left ventricular diastolic parameters are indeterminate.  3. Global right ventricle has normal systolic function.The right  ventricular size is normal. No increase in right ventricular wall  thickness.  4. Left atrial  size was mildly dilated.  5. Mild mitral annular calcification.  6. The mitral valve was not well visualized. No evidence of mitral valve  regurgitation. Mild mitral stenosis.  7. MV peak gradient, 8.6 mmHg.  8. The tricuspid valve is normal in structure. Tricuspid valve  regurgitation is not demonstrated.  9. The aortic valve is normal in structure. Aortic valve regurgitation is  not visualized. Mild aortic valve sclerosis without stenosis.  10. TR signal is inadequate for assessing pulmonary artery systolic  pressure.  11. The inferior vena cava is normal in size with greater than 50%  respiratory variability, suggesting right atrial pressure of 3 mmHg.  Patient Profile     59 y.o. female with hx of HFpEF, anemia, hepatitis C, diabetes, CKD stage III, hypertensive heart disease, morbid obesity, sleep apnea being seen for edema and shortness of breath.  Fluid overload likely secondary to dietary and medication nonadherence.  Assessment & Plan    1.  Edema, shortness of breath, history of heart failure preserved ejection fraction -Symptoms improved with diuresing -Net -1.6 L.  Today's weight pending. -Creatinine improving with diuresing. -Continue Lasix IV 60 twice daily. -repeat echocardiogram pending  2. Acute on chronic ckd -Cr improving with diuresing -monitor Cr  3. htn - cont labetalol, hydralazine, amlodipine  Total encounter time more 35 minutes  Greater than 50% was spent in counseling and coordination of care with the patient       Signed, Kate Sable, MD  11/07/2019, 8:29 AM

## 2019-11-07 NOTE — Progress Notes (Signed)
Unable to complete REDs reading due to BMI.

## 2019-11-07 NOTE — Progress Notes (Signed)
*  PRELIMINARY RESULTS* Echocardiogram 2D Echocardiogram has been performed.  [name] [name] 11/07/2019, 1:05 PM

## 2019-11-07 NOTE — Progress Notes (Signed)
Inpatient Diabetes Program Recommendations  AACE/ADA: New Consensus Statement on Inpatient Glycemic Control (2015)  Target Ranges:  Prepandial:   less than 140 mg/dL      Peak postprandial:   less than 180 mg/dL (1-2 hours)      Critically ill patients:  140 - 180 mg/dL   Lab Results  Component Value Date   GLUCAP 258 (H) 11/07/2019   HGBA1C 10.0 (H) 08/18/2019    Review of Glycemic Control Results for [name] [name], [name] [name] (MRN [id number]) as of 11/07/2019 15:29  Ref. Range 08/19/2019 11:55 11/06/2019 17:30 11/06/2019 21:00 11/07/2019 07:48 11/07/2019 12:01  Glucose-Capillary Latest Ref Range: 70 - 99 mg/dL 182 (H) 306 (H) 289 (H) 177 (H) 258 (H)   Diabetes history: DM2 Outpatient Diabetes medications: Lantus 65 units daily Current orders for Inpatient glycemic control: Lantus 50 units qd + Novolog moderate correction tid + hs 0-5 units  Inpatient Diabetes Program Recommendations:    -Add carb modified to diet -Add Novolog 4 units tid meal coverage if eats 50% meals  Spoke with patient to discuss diabetes management and clarify what patient is currently taking. Patient only taking Lantus currently and has not started Victoza yet per her physician's instructions.  Patient has glucometer and strips but does not check her CBG every day. Reviewed with patient to check her CBG regularly and encouraged to check CBG different times of the day including ac meals and pc meals along with fasting to assist with medication management. Patient was seen by DM coordinator 04/10/19 with A1c of 13.9 discussed with patient. Patient sometimes drinks tea and koolaid with regular sugar but states willingness to switch to sugar free drinks. Will follow during hospitalization. Secure chat to Dr. Fritzi Mandes.  Thank you, [name] [name]. [name] Llerena, RN, MSN, CDE  Diabetes Coordinator Inpatient Glycemic Control Team Team Pager [phone] (8am-5pm) 11/07/2019 3:35 PM

## 2019-11-08 DIAGNOSIS — Z794 Long term (current) use of insulin: Secondary | ICD-10-CM

## 2019-11-08 DIAGNOSIS — E0865 Diabetes mellitus due to underlying condition with hyperglycemia: Secondary | ICD-10-CM

## 2019-11-08 LAB — IRON AND TIBC
Iron: 40 ug/dL (ref 28–170)
Saturation Ratios: 14 % (ref 10.4–31.8)
TIBC: 281 ug/dL (ref 250–450)
UIBC: 241 ug/dL

## 2019-11-08 LAB — BASIC METABOLIC PANEL
Anion gap: 10 (ref 5–15)
BUN: 45 mg/dL — ABNORMAL HIGH (ref 6–20)
CO2: 23 mmol/L (ref 22–32)
Calcium: 8.7 mg/dL — ABNORMAL LOW (ref 8.9–10.3)
Chloride: 105 mmol/L (ref 98–111)
Creatinine, Ser: 1.68 mg/dL — ABNORMAL HIGH (ref 0.44–1.00)
GFR calc Af Amer: 38 mL/min — ABNORMAL LOW (ref >60)
GFR calc non Af Amer: 33 mL/min — ABNORMAL LOW (ref >60)
Glucose, Bld: 274 mg/dL — ABNORMAL HIGH (ref 70–99)
Potassium: 4 mmol/L (ref 3.5–5.1)
Sodium: 138 mmol/L (ref 135–145)

## 2019-11-08 LAB — GLUCOSE, CAPILLARY
Glucose-Capillary: 223 mg/dL — ABNORMAL HIGH (ref 70–99)
Glucose-Capillary: 291 mg/dL — ABNORMAL HIGH (ref 70–99)

## 2019-11-08 LAB — FERRITIN: Ferritin: 133 ng/mL (ref 11–307)

## 2019-11-08 MED ORDER — HYDRALAZINE HCL 100 MG PO TABS: 100.0000 mg | ORAL_TABLET | Freq: Three times a day (TID) | ORAL | 1 refills | Status: DC

## 2019-11-08 MED ORDER — INSULIN ASPART 100 UNIT/ML ~~LOC~~ SOLN
5.0000 [IU] | Freq: Three times a day (TID) | SUBCUTANEOUS | Status: DC
  Administered 2019-11-08: 5 [IU] via SUBCUTANEOUS
  Filled 2019-11-08: qty 1

## 2019-11-08 MED ORDER — FERROUS SULFATE 325 (65 FE) MG PO TABS: 325.0000 mg | ORAL_TABLET | Freq: Two times a day (BID) | ORAL | 3 refills | Status: DC

## 2019-11-08 MED ORDER — INSULIN GLARGINE 100 UNIT/ML ~~LOC~~ SOLN
65.0000 [IU] | Freq: Every day | SUBCUTANEOUS | Status: DC
  Filled 2019-11-08: qty 0.65

## 2019-11-08 MED ORDER — FUROSEMIDE 40 MG PO TABS: 40.0000 mg | ORAL_TABLET | Freq: Two times a day (BID) | ORAL | Status: DC

## 2019-11-08 MED ORDER — FERROUS SULFATE 325 (65 FE) MG PO TABS: 325.0000 mg | ORAL_TABLET | Freq: Two times a day (BID) | ORAL | Status: DC

## 2019-11-08 MED ORDER — ASPIRIN 81 MG PO TBEC: 81.0000 mg | DELAYED_RELEASE_TABLET | Freq: Every day | ORAL | 1 refills | Status: DC

## 2019-11-08 MED ORDER — FUROSEMIDE 40 MG PO TABS
40.0000 mg | ORAL_TABLET | Freq: Two times a day (BID) | ORAL | Status: DC
  Administered 2019-11-08: 40 mg via ORAL
  Filled 2019-11-08: qty 1

## 2019-11-08 NOTE — Plan of Care (Signed)

## 2019-11-08 NOTE — Progress Notes (Signed)
Patient given discharge instructions. IV taken out and tele monitor off. Patient verbalized understanding without any questions or concerns. Education on heart failure and importance of regular sugar checks gone over. Patient verbalized understanding.

## 2019-11-08 NOTE — Plan of Care (Signed)
Nutrition Education Note  RD consulted for nutrition education regarding CHF and DM.  RD provided "Low Sodium Nutrition Therapy" handout from the Academy of [employer]. Reviewed patient's dietary recall. Provided examples on ways to decrease sodium intake in diet. Discouraged intake of processed foods and use of salt shaker. Encouraged fresh fruits and vegetables as well as whole grain sources of carbohydrates to maximize fiber intake.   RD discussed why it is important for patient to adhere to diet recommendations, and emphasized the role of fluids, foods to avoid, and importance of weighing self daily.   RD also provided "Nutrition with Type II Diabetes" handout from the [employer] of [employer]. Discussed different food groups and their effects on blood sugar, emphasizing carbohydrate-containing foods. Provided list of carbohydrates and recommended serving sizes of common foods.  Discussed importance of controlled and consistent carbohydrate intake throughout the day. Provided examples of ways to balance meals/snacks and encouraged intake of high-fiber, whole grain complex carbohydrates.   Teach back method used.  Expect good compliance.  Body mass index is 40.18 kg/m. Pt meets criteria for obesity based on current BMI.  Current diet order is 2gram sodium diet, patient is consuming approximately 100% of meals at this time. Labs and medications reviewed. No further nutrition interventions warranted at this time. RD contact information provided. If additional nutrition issues arise, please re-consult RD.   Koleen Distance MS, RD, LDN Please refer to [facility] for RD and/or RD on-call/weekend/after hours pager

## 2019-11-08 NOTE — Progress Notes (Signed)
Progress Note  Patient Name: [name] [name] Date of Encounter: 11/08/2019  Primary Cardiologist: Hochrein  Subjective   Dyspnea and lower extremity swelling much improved. No chest pain. Wants to go home. Documented UOP 886 mL for the past 24 hours with a net - 2.5 L for the admission. Weight 114.1-->112.7 kg. Renal function relatively stable. Potassium at goal.   Inpatient Medications    Scheduled Meds: . amLODipine  10 mg Oral Daily  . aspirin EC  81 mg Oral Daily  . atorvastatin  40 mg Oral Daily  . enoxaparin (LOVENOX) injection  40 mg Subcutaneous BID  . ferrous sulfate  325 mg Oral BID WC  . furosemide  60 mg Intravenous Q12H  . hydrALAZINE  100 mg Oral TID  . insulin aspart  0-15 Units Subcutaneous TID WC  . insulin aspart  0-5 Units Subcutaneous QHS  . insulin aspart  3 Units Subcutaneous TID WC  . insulin glargine  55 Units Subcutaneous QHS  . ipratropium  2 spray Each Nare TID  . labetalol  100 mg Oral BID  . loratadine  10 mg Oral Daily  . pregabalin  75 mg Oral BID  . sodium chloride flush  3 mL Intravenous Once  . sodium chloride flush  3 mL Intravenous Q12H   Continuous Infusions: . sodium chloride     PRN Meds: sodium chloride, acetaminophen, albuterol, ALPRAZolam, ondansetron (ZOFRAN) IV, sodium chloride flush   Vital Signs    Vitals:   11/07/19 1721 11/07/19 1755 11/07/19 1958 11/08/19 0359  BP: (!) 149/74  (!) 144/76 (!) 157/79  Pulse: 85  91 79  Resp: 18     Temp: 98.3 F (36.8 C)  98.1 F (36.7 C) 98.3 F (36.8 C)  TempSrc: Oral  Oral Oral  SpO2: 92% 94% 91% 93%  Weight:    112.7 kg  Height:        Intake/Output Summary (Last 24 hours) at 11/08/2019 0715 Last data filed at 11/08/2019 0402 Gross per 24 hour  Intake 714 ml  Output 1600 ml  Net -886 ml   Filed Weights   11/06/19 1952 11/07/19 0549 11/08/19 0359  Weight: 114.3 kg 114.1 kg 112.7 kg    Telemetry    Sinus with rates in the 40s to low 100s bpm, occasional PVCs -  Personally Reviewed  ECG    No new tracings - Personally Reviewed  Physical Exam   GEN: No acute distress.   Neck: No JVD. Cardiac: RRR, no murmurs, rubs, or gallops.  Respiratory: Clear to auscultation bilaterally.  GI: Soft, nontender, non-distended.   MS: Trace bilateral pretibial edema; No deformity. Neuro:  Alert and oriented x 3; Nonfocal.  Psych: Normal affect.  Labs    Chemistry Recent Labs  Lab 11/05/19 1851 11/05/19 1851 11/06/19 0700 11/07/19 0528 11/08/19 0435  NA 141   < > 140 140 138  K 4.1   < > 4.1 4.0 4.0  CL 110   < > 110 108 105  CO2 24   < > 23 25 23   GLUCOSE 292*   < > 240* 241* 274*  BUN 47*   < > 46* 45* 45*  CREATININE 1.91*   < > 1.70* 1.65* 1.68*  CALCIUM 8.5*   < > 8.5* 8.6* 8.7*  PROT 6.9  --   --   --   --   ALBUMIN 3.6  --   --   --   --   AST 11*  --   --   --   --  ALT 12  --   --   --   --   ALKPHOS 57  --   --   --   --   BILITOT 0.5  --   --   --   --   GFRNONAA 28*   < > 33* 34* 33*  GFRAA 33*   < > 38* 39* 38*  ANIONGAP 7   < > 7 7 10    < > = values in this interval not displayed.     Hematology Recent Labs  Lab 11/05/19 1851  WBC 4.7  RBC 3.10*  HGB 8.7*  HCT 27.1*  MCV 87.4  MCH 28.1  MCHC 32.1  RDW 13.7  PLT 161    Cardiac EnzymesNo results for input(s): TROPONINI in the last 168 hours. No results for input(s): TROPIPOC in the last 168 hours.   BNP Recent Labs  Lab 11/05/19 1851  BNP 173.0*     DDimer No results for input(s): DDIMER in the last 168 hours.   Radiology    CXR 11/05/2019: IMPRESSION: No active cardiopulmonary disease.  Cardiac Studies   2D echo 11/07/2019: 1. Left ventricular ejection fraction, by estimation, is 60 to 65%. The  left ventricle has normal function. The left ventricle has no regional  wall motion abnormalities. There is mild left ventricular hypertrophy.  Left ventricular diastolic parameters  are consistent with Grade II diastolic dysfunction (pseudonormalization).   2. Right ventricular systolic function is normal. The right ventricular  size is normal.  3. The mitral valve is normal in structure. No evidence of mitral valve  regurgitation.  4. The aortic valve is tricuspid. Aortic valve regurgitation is not  visualized.   Patient Profile     59 y.o. female with history of HFpEF, anemia, hepatitis C, diabetes, CKD stage III, hypertensive heart disease, morbid obesity, sleep apnea and possible OHS, and medication nonadherence who is being seen today for the evaluation of acute on chronic HFpEF.  Assessment & Plan    1. Acute on chronic HFpEF: -Exacerbated by dietary and medication noncompliance -At admission, she was approximately 15 to 20 pounds volume up -Symptoms much improved -Volume status is much improved, net - 2.5 L for the admission  -Some of her lower extremity swelling may be exacerbated by amlodipine  -Transition from IV Lasix to PTA Lasix 40 mg bid with recommendation for medication and dietary adherence  -CHF education was discussed in detail -Daily weights with strict I's and O's  2. Acute hypoxic respiratory distress: -Likely multifactorial including underlying pulmonary disease, acute on chronic HFpEF, anemia with hemoglobin of 8.7 at time of admission, physical deconditioning with morbid obesity and sleep apnea with possible OHS -Back on room air  3.  Acute on CKD stage III: -Possibly exacerbated in the setting of vascular congestion -Stable -Monitor with diuresis  4.  Anemia: -Possibly in the setting of chronic disease -Hemoglobin low though stable at time of admission, consider trending -Management per internal medicine -Exacerbating her overall presentation -Cannot exclude some degree of third spacing  5.  Obesity with sleep apnea and possible OHS: -Weight loss advised -CPAP -Outpatient follow-up with PCP  6.  HTN: -Low sodium diet  -Labetalol, amlodipine, hydralazine    For questions or updates,  please contact [place] Please consult [email or web address] for contact info under Cardiology/STEMI.    Signed, Christell Faith, PA-C [place] Pager: [phone] 11/08/2019, 7:15 AM

## 2019-11-08 NOTE — Discharge Instructions (Signed)
Wear your oxygen as instrtucted at night Keep log of your sugars at home and review with PCP

## 2019-11-08 NOTE — Discharge Summary (Addendum)
[facility] [place] NAME: [name] [name]    MR#:  [id number]  DATE OF BIRTH:  [birth year]  DATE OF ADMISSION:  11/05/2019 ADMITTING PHYSICIAN: Christel Mormon, MD  DATE OF DISCHARGE: 11/08/2019  PRIMARY CARE PHYSICIAN: [name], MD    ADMISSION DIAGNOSIS:  Acute on chronic diastolic CHF (congestive heart failure) (HCC) [I50.33] Acute respiratory failure with hypoxemia (HCC) [J96.01] Chronic kidney disease, unspecified CKD stage [N18.9] Acute on chronic congestive heart failure, unspecified heart failure type ([place]) [I50.9]  DISCHARGE DIAGNOSIS:  Acute on Chronic Diastolic CHF Suspected OSA--now in oxygen at nighttime Uncontrolled DM-2 with Nephropathy/Neuropathy/Retinoapthy Morbid obesity SECONDARY DIAGNOSIS:   Past Medical History:  Diagnosis Date  . Anemia   . CHF (congestive heart failure) ([place])   . Chronic hepatitis C without hepatic coma ([place]) 11/09/2016  . Diabetes mellitus   . Fibroids   . 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 Hypertension   . MRSA (methicillin resistant Staphylococcus aureus)   . Trichomonas   . VAGINITIS, BACTERIAL, RECURRENT 08/15/2007   Qualifier: Diagnosis of  By: Radene Ou MD, [facility] COURSE:  59 year old female with history of diastolic CHF, uncontrolled type 2 diabetes mellitus, hypertension, CKD stage III (B), hepatitis C, sleep apnea and medication nonadherence presented to the ED with 3-day history of worsening shortness of breath associated with bilateral leg swelling, productive cough of whitish sputum and wheezing.  Acute on chronic diastolic CHF (HCC) -Placed on IV Lasix 60 mg every 12 hours.--diuresed well. sats on ambulation and RA >92% - now on RA -weight trending down -Continue strict I's/O and daily weight. -Cardiology consult with Dr Garen Lah appreciated. -cont labetalol, hydralazine and po lasix -Dietitian to see prior to  d/c  Acute respiratory failure with hypoxia ([place]) -Combination of CHF and possible underlying OHS.  -recieved supplemental oxygen and wean as tolerated. -Needs outpatient sleep study. - overnite Pulse oximetry tonite--pt qualifies for oxygen at night  Chronic kidney disease stage IIIb Likely diabetic nephropathy. Renal function at baseline.  Monitor while on diuretic. -creat 1.6  Anemia of chronic disease H&H at baseline Pt on iron pills. Will defer to PCP to consider having pt seen by hematology as out pt.  Morbid obesity with suspectedOHS Outpatient sleep study --defer to PCP overnite pulse oximetry with significant desaturation--Oxygen at night  Uncontrolled type 2 diabetes mellitus with hyperglycemia with chronic kidney disease and peripheral neuropathy. -Continue basal insulin lantus and NovoLog sliding scale coverage.  -pt takes victoza at home -Continue Lyrica. -advised diet and exercise  Essential hypertension Continue home meds  Medication nonadherence  counseled to take care of her health with diet, meds,exercise and F/u with PCP  Overall improved. D/c home  DVT prophylaxis:Lovenox Code Status:Full code Family Communication:dter Ebony on the phone Disposition:home today Status is: Inpatient  Dispo: The patient is from:Home Anticipated d/c is FI:EPPI Anticipated d/c date is:4/22 Patient currentlyis  medically stable for d/c today  CONSULTS OBTAINED:  Treatment Team:  Nelva Bush, MD  DRUG ALLERGIES:   Allergies  Allergen Reactions  . Lisinopril Swelling    DISCHARGE MEDICATIONS:   Allergies as of 11/08/2019      Reactions   Lisinopril Swelling      Medication List    STOP taking these medications   penicillin v potassium 500 MG tablet Commonly known as: VEETID     TAKE these medications   albuterol 108 (90  Base) MCG/ACT inhaler Commonly known as: VENTOLIN HFA Inhale  2 puffs into the lungs every 6 (six) hours as needed for wheezing or shortness of breath.   amLODipine 10 MG tablet Commonly known as: NORVASC Take 1 tablet (10 mg total) by mouth daily.   aspirin 81 MG EC tablet Take 1 tablet (81 mg total) by mouth daily.   atorvastatin 40 MG tablet Commonly known as: LIPITOR Take 1 tablet (40 mg total) by mouth daily.   blood glucose meter kit and supplies Kit Dispense based on patient and insurance preference. Use up to four times daily as directed. (FOR ICD-9 250.00, 250.01).   ferrous sulfate 325 (65 FE) MG tablet Take 1 tablet (325 mg total) by mouth 2 (two) times daily with a meal.   furosemide 40 MG tablet Commonly known as: LASIX Take 1 tablet (40 mg total) by mouth 2 (two) times daily.   hydrALAZINE 100 MG tablet Commonly known as: APRESOLINE Take 1 tablet (100 mg total) by mouth 3 (three) times daily for 90 doses.   ipratropium 0.06 % nasal spray Commonly known as: ATROVENT Place 2 sprays into both nostrils 3 (three) times daily.   labetalol 100 MG tablet Commonly known as: NORMODYNE Take 1 tablet (100 mg total) by mouth 2 (two) times daily.   Lantus SoloStar 100 UNIT/ML Solostar Pen Generic drug: insulin glargine Inject 65 Units into the skin at bedtime.   loratadine 10 MG tablet Commonly known as: CLARITIN Take 1 tablet (10 mg total) by mouth daily.   Lyrica 75 MG capsule Generic drug: pregabalin Take 1 capsule (75 mg total) by mouth 2 (two) times daily.   Misc. Devices Environmental health practitioner  Dx- CHF; Duration-lifetime. Weight 241 lbs   Victoza 18 MG/3ML Sopn Generic drug: liraglutide Start 0.44m SQ once a day for 7 days, then increase to 1.268monce a day for 7 days, then increase to 1.8 mg daily thereafter            Durable Medical Equipment  (From admission, onward)         Start     Ordered   11/08/19 0813  For home use only DME oxygen  Once    Question Answer Comment  Length of Need  Lifetime   Mode or (Route) Nasal cannula   Liters per Minute 2   Frequency Only at night (stationary unit needed)   Oxygen conserving device Yes   Oxygen delivery system Gas      11/08/19 0813          If you experience worsening of your admission symptoms, develop shortness of breath, life threatening emergency, suicidal or homicidal thoughts you must seek medical attention immediately by calling 911 or calling your MD immediately  if symptoms less severe.  You Must read complete instructions/literature along with all the possible adverse reactions/side effects for all the Medicines you take and that have been prescribed to you. Take any new Medicines after you have completely understood and accept all the possible adverse reactions/side effects.   Please note  You were cared for by a hospitalist during your hospital stay. If you have any questions about your discharge medications or the care you received while you were in the hospital after you are discharged, you can call the unit and asked to speak with the hospitalist on call if the hospitalist that took care of you is not available. Once you are discharged, your primary care physician will handle any  further medical issues. Please note that NO REFILLS for any discharge medications will be authorized once you are discharged, as it is imperative that you return to your primary care physician (or establish a relationship with a primary care physician if you do not have one) for your aftercare needs so that they can reassess your need for medications and monitor your lab values. Today   SUBJECTIVE   I am breathing so much better  VITAL SIGNS:  Blood pressure (!) 153/88, pulse 80, temperature 97.9 F (36.6 C), temperature source Oral, resp. rate 18, height '5\' 6"'  (1.676 m), weight 112.7 kg, last menstrual period 04/01/2010, SpO2 97 %.  I/O:    Intake/Output Summary (Last 24 hours) at 11/08/2019 0827 Last data filed at 11/08/2019  0809 Gross per 24 hour  Intake 714 ml  Output 2500 ml  Net -1786 ml    PHYSICAL EXAMINATION:  GENERAL:  59 y.o.-year-old patient lying in the bed with no acute distress. obese EYES: Pupils equal, round, reactive to light and accommodation. No scleral icterus.  HEENT: Head atraumatic, normocephalic. Oropharynx and nasopharynx clear.  NECK:  Supple, no jugular venous distention. No thyroid enlargement, no tenderness.  LUNGS: Normal breath sounds bilaterally, no wheezing, rales,rhonchi or crepitation. No use of accessory muscles of respiration.  CARDIOVASCULAR: S1, S2 normal. No murmurs, rubs, or gallops.  ABDOMEN: Soft, non-tender, non-distended. Bowel sounds present. No organomegaly or mass.  EXTREMITIES: mild bilateral pedal edema, no cyanosis, or clubbing.  NEUROLOGIC: Cranial nerves II through XII are intact. Muscle strength 5/5 in all extremities. Sensation intact. Gait not checked.  PSYCHIATRIC: The patient is alert and oriented x 3.  SKIN: No obvious rash, lesion, or ulcer.   DATA REVIEW:   CBC  Recent Labs  Lab 11/05/19 1851  WBC 4.7  HGB 8.7*  HCT 27.1*  PLT 161    Chemistries  Recent Labs  Lab 11/05/19 1851 11/06/19 0700 11/08/19 0435  NA 141   < > 138  K 4.1   < > 4.0  CL 110   < > 105  CO2 24   < > 23  GLUCOSE 292*   < > 274*  BUN 47*   < > 45*  CREATININE 1.91*   < > 1.68*  CALCIUM 8.5*   < > 8.7*  AST 11*  --   --   ALT 12  --   --   ALKPHOS 57  --   --   BILITOT 0.5  --   --    < > = values in this interval not displayed.    Microbiology Results   Recent Results (from the past 240 hour(s))  Respiratory Panel by RT PCR (Flu A&B, Covid) - Nasopharyngeal Swab     Status: None   Collection Time: 11/06/19  1:26 AM   Specimen: Nasopharyngeal Swab  Result Value Ref Range Status   SARS Coronavirus 2 by RT PCR NEGATIVE NEGATIVE Final    Comment: (NOTE) SARS-CoV-2 target nucleic acids are NOT DETECTED. The SARS-CoV-2 RNA is generally detectable in  upper respiratoy specimens during the acute phase of infection. The lowest concentration of SARS-CoV-2 viral copies this assay can detect is 131 copies/mL. A negative result does not preclude SARS-Cov-2 infection and should not be used as the sole basis for treatment or other patient management decisions. A negative result may occur with  improper specimen collection/handling, submission of specimen other than nasopharyngeal swab, presence of viral mutation(s) within the areas targeted by this assay, and  inadequate number of viral copies (<131 copies/mL). A negative result must be combined with clinical observations, patient history, and epidemiological information. The expected result is Negative. Fact Sheet for Patients:  PinkCheek.be Fact Sheet for Healthcare Providers:  GravelBags.it This test is not yet ap proved or cleared by the [place] FDA and  has been authorized for detection and/or diagnosis of SARS-CoV-2 by FDA under an Emergency Use Authorization (EUA). This EUA will remain  in effect (meaning this test can be used) for the duration of the COVID-19 declaration under Section 564(b)(1) of the Act, 21 U.S.C. section 360bbb-3(b)(1), unless the authorization is terminated or revoked sooner.    Influenza A by PCR NEGATIVE NEGATIVE Final   Influenza B by PCR NEGATIVE NEGATIVE Final    Comment: (NOTE) The Xpert Xpress SARS-CoV-2/FLU/RSV assay is intended as an aid in  the diagnosis of influenza from Nasopharyngeal swab specimens and  should not be used as a sole basis for treatment. Nasal washings and  aspirates are unacceptable for Xpert Xpress SARS-CoV-2/FLU/RSV  testing. Fact Sheet for Patients: PinkCheek.be Fact Sheet for Healthcare Providers: GravelBags.it This test is not yet approved or cleared by the [place] FDA and  has been authorized for  detection and/or diagnosis of SARS-CoV-2 by  FDA under an Emergency Use Authorization (EUA). This EUA will remain  in effect (meaning this test can be used) for the duration of the  Covid-19 declaration under Section 564(b)(1) of the Act, 21  U.S.C. section 360bbb-3(b)(1), unless the authorization is  terminated or revoked. Performed at [facility], [address] [place], [place] [address]     RADIOLOGY:  ECHOCARDIOGRAM COMPLETE  Result Date: 11/07/2019    ECHOCARDIOGRAM REPORT   Patient Name:   [name] [name] Date of Exam: 11/07/2019 Medical Rec #:  [id number]      Height:       66.0 in Accession #:    [id number]     Weight:       251.6 lb Date of Birth:  August 11, 1960      BSA:          2.205 m Patient Age:    15 years       BP:           107/57 mmHg Patient Gender: F              HR:           76 bpm. Exam Location:  [facility] Procedure: 2D Echo, Cardiac Doppler and Color Doppler Indications:     CHF- acute diastolic 373.42  History:         Patient has prior history of Echocardiogram examinations, most                  recent 06/04/2019. CHF; Risk Factors:Hypertension and Diabetes.  Sonographer:     Sherrie Sport RDCS (AE) Referring Phys:  [id number] Arvella Merles MANSY Diagnosing Phys: Kate Sable MD IMPRESSIONS  1. Left ventricular ejection fraction, by estimation, is 60 to 65%. The left ventricle has normal function. The left ventricle has no regional wall motion abnormalities. There is mild left ventricular hypertrophy. Left ventricular diastolic parameters are consistent with Grade II diastolic dysfunction (pseudonormalization).  2. Right ventricular systolic function is normal. The right ventricular size is normal.  3. The mitral valve is normal in structure. No evidence of mitral valve regurgitation.  4. The aortic valve is tricuspid. Aortic valve regurgitation is not visualized. FINDINGS  Left Ventricle: Left ventricular ejection  fraction, by estimation, is 60 to 65%. The left ventricle has  normal function. The left ventricle has no regional wall motion abnormalities. The left ventricular internal cavity size was normal in size. There is  mild left ventricular hypertrophy. Left ventricular diastolic parameters are consistent with Grade II diastolic dysfunction (pseudonormalization). Right Ventricle: The right ventricular size is normal. Right vetricular wall thickness was not assessed. Right ventricular systolic function is normal. Left Atrium: Left atrial size was normal in size. Right Atrium: Right atrial size was normal in size. Pericardium: There is no evidence of pericardial effusion. Mitral Valve: The mitral valve is normal in structure. No evidence of mitral valve regurgitation. Tricuspid Valve: The tricuspid valve is grossly normal. Tricuspid valve regurgitation is trivial. Aortic Valve: The aortic valve is tricuspid. Aortic valve regurgitation is not visualized. Aortic valve mean gradient measures 4.0 mmHg. Aortic valve peak gradient measures 8.2 mmHg. Aortic valve area, by VTI measures 3.00 cm. Pulmonic Valve: The pulmonic valve was not well visualized. Pulmonic valve regurgitation is not visualized. Aorta: The aortic root is normal in size and structure. Venous: The inferior vena cava was not well visualized. IAS/Shunts: No atrial level shunt detected by color flow Doppler.  LEFT VENTRICLE PLAX 2D LVIDd:         4.26 cm  Diastology LVIDs:         3.02 cm  LV e' lateral:   6.09 cm/s LV PW:         1.59 cm  LV E/e' lateral: 17.2 LV IVS:        1.37 cm  LV e' medial:    4.13 cm/s LVOT diam:     2.00 cm  LV E/e' medial:  25.4 LV SV:         95 LV SV Index:   43 LVOT Area:     3.14 cm  RIGHT VENTRICLE RV Basal diam:  3.17 cm RV S prime:     12.70 cm/s TAPSE (M-mode): 3.4 cm LEFT ATRIUM             Index       RIGHT ATRIUM           Index LA diam:        4.20 cm 1.91 cm/m  RA Area:     18.00 cm LA Vol (A2C):   51.9 ml 23.54 ml/m RA Volume:   50.30 ml  22.81 ml/m LA Vol (A4C):   39.0 ml 17.69  ml/m LA Biplane Vol: 46.8 ml 21.23 ml/m  AORTIC VALVE AV Area (Vmax):    2.26 cm AV Area (Vmean):   2.44 cm AV Area (VTI):     3.00 cm AV Vmax:           143.00 cm/s AV Vmean:          91.700 cm/s AV VTI:            0.316 m AV Peak Grad:      8.2 mmHg AV Mean Grad:      4.0 mmHg LVOT Vmax:         103.00 cm/s LVOT Vmean:        71.300 cm/s LVOT VTI:          0.302 m LVOT/AV VTI ratio: 0.96  AORTA Ao Root diam: 2.80 cm MITRAL VALVE                TRICUSPID VALVE MV Area (PHT): 3.42 cm     TR Peak grad:  34.8 mmHg MV Decel Time: 222 msec     TR Vmax:        295.00 cm/s MV E velocity: 105.00 cm/s MV A velocity: 133.00 cm/s  SHUNTS MV E/A ratio:  0.79         Systemic VTI:  0.30 m                             Systemic Diam: 2.00 cm Kate Sable MD Electronically signed by Kate Sable MD Signature Date/Time: 11/07/2019/5:44:21 PM    Final      CODE STATUS:     Code Status Orders  (From admission, onward)         Start     Ordered   11/06/19 0052  Full code  Continuous     11/06/19 0054        Code Status History    Date Active Date Inactive Code Status Order ID Comments User Context   08/18/2019 0412 08/19/2019 2041 Full Code [id number]  Gwynne Edinger, MD ED   06/03/2019 0149 06/05/2019 1717 Full Code [id number]  Mansy, Arvella Merles, MD ED   04/09/2019 2350 04/12/2019 1555 Full Code [id number]  Mayo, Pete Pelt, MD Inpatient   08/17/2018 1324 08/19/2018 2121 Full Code [id number]  Karmen Bongo, MD ED   Advance Care Planning Activity       TOTAL TIME TAKING CARE OF THIS PATIENT: *40* minutes.    Fritzi Mandes M.D  [facility]    CC: Primary care physician; [name], MD

## 2019-11-09 ENCOUNTER — Telehealth: Payer: Self-pay

## 2019-11-09 DIAGNOSIS — I5032 Chronic diastolic (congestive) heart failure: Secondary | ICD-10-CM | POA: Diagnosis not present

## 2019-11-09 NOTE — Telephone Encounter (Signed)
Transition Care Management Follow-up Telephone Call Date of discharge and from where: 11/08/2019, [facility]  Call placed to patient 3 [phone], message left with call back requested to this CM.  Patient has an appointment with Dr Margarita Rana 11/22/2019

## 2019-11-12 ENCOUNTER — Telehealth: Payer: Self-pay

## 2019-11-12 NOTE — Telephone Encounter (Signed)
Transition Care Management Follow-up Telephone Call Attempt # 2 Date of discharge and from where: 11/08/2019, [facility]  Call placed to patient [phone], voicemail full, unable to leave a message Patient has an appointment with Dr Margarita Rana 11/22/2019

## 2019-11-13 ENCOUNTER — Telehealth: Payer: Self-pay | Admitting: Family

## 2019-11-13 NOTE — Telephone Encounter (Signed)
Unable to reach patient regarding her follow up chf clinic appointment we scheduled for 5/4 after her recent hospital discharge.    Alyse Low, [place]

## 2019-11-15 ENCOUNTER — Telehealth: Payer: Self-pay | Admitting: Family Medicine

## 2019-11-15 DIAGNOSIS — E113513 Type 2 diabetes mellitus with proliferative diabetic retinopathy with macular edema, bilateral: Secondary | ICD-10-CM | POA: Diagnosis not present

## 2019-11-15 DIAGNOSIS — H4313 Vitreous hemorrhage, bilateral: Secondary | ICD-10-CM | POA: Diagnosis not present

## 2019-11-15 DIAGNOSIS — Z961 Presence of intraocular lens: Secondary | ICD-10-CM | POA: Diagnosis not present

## 2019-11-15 NOTE — Telephone Encounter (Signed)
Patient informed rep that the hydralazine 100mg  TID, makes her feel very nausea when she takes it. Patient also expressed concerns regarding her shower chair, patient stated she has never received one yet. Please follow up at your earliest convenience.

## 2019-11-16 NOTE — Telephone Encounter (Signed)
Patient states that hydralazine makes her have nausea.  I will check on the shower chair.

## 2019-11-17 DIAGNOSIS — I5032 Chronic diastolic (congestive) heart failure: Secondary | ICD-10-CM | POA: Diagnosis not present

## 2019-11-19 DIAGNOSIS — H4313 Vitreous hemorrhage, bilateral: Secondary | ICD-10-CM | POA: Diagnosis not present

## 2019-11-19 DIAGNOSIS — H4312 Vitreous hemorrhage, left eye: Secondary | ICD-10-CM | POA: Diagnosis not present

## 2019-11-19 DIAGNOSIS — E113513 Type 2 diabetes mellitus with proliferative diabetic retinopathy with macular edema, bilateral: Secondary | ICD-10-CM | POA: Diagnosis not present

## 2019-11-19 DIAGNOSIS — Z794 Long term (current) use of insulin: Secondary | ICD-10-CM | POA: Diagnosis not present

## 2019-11-19 NOTE — Telephone Encounter (Signed)
I will address this at her upcoming visit in 3 days.

## 2019-11-20 ENCOUNTER — Ambulatory Visit: Payer: Medicaid Other | Admitting: Family

## 2019-11-21 ENCOUNTER — Encounter (HOSPITAL_COMMUNITY)
Admission: RE | Admit: 2019-11-21 | Discharge: 2019-11-21 | Disposition: A | Discharge status: Home / Self Care | Payer: Medicaid Other | Source: Ambulatory Visit | Attending: Nephrology | Admitting: Nephrology

## 2019-11-21 ENCOUNTER — Other Ambulatory Visit: Payer: Self-pay

## 2019-11-21 VITALS — BP 167/94 | HR 89 | Temp 97.1°F | Resp 18

## 2019-11-21 DIAGNOSIS — D649 Anemia, unspecified: Secondary | ICD-10-CM | POA: Diagnosis not present

## 2019-11-21 LAB — POCT HEMOGLOBIN-HEMACUE: Hemoglobin: 10.6 g/dL — ABNORMAL LOW (ref 12.0–15.0)

## 2019-11-21 MED ORDER — EPOETIN ALFA-EPBX 10000 UNIT/ML IJ SOLN: 10000.0000 [IU] | INTRAMUSCULAR | Status: DC

## 2019-11-21 MED ORDER — EPOETIN ALFA-EPBX 10000 UNIT/ML IJ SOLN
INTRAMUSCULAR | Status: AC
  Administered 2019-11-21: 10000 [IU] via SUBCUTANEOUS
  Filled 2019-11-21: qty 1

## 2019-11-21 NOTE — Discharge Instructions (Signed)

## 2019-11-22 ENCOUNTER — Ambulatory Visit: Payer: Medicaid Other | Attending: Family Medicine | Admitting: Family Medicine

## 2019-11-22 ENCOUNTER — Other Ambulatory Visit: Payer: Self-pay

## 2019-11-23 ENCOUNTER — Ambulatory Visit: Payer: Medicaid Other | Admitting: Cardiology

## 2019-12-05 ENCOUNTER — Ambulatory Visit: Payer: Medicaid Other | Attending: Family | Admitting: Family

## 2019-12-05 ENCOUNTER — Encounter: Payer: Self-pay | Admitting: Family

## 2019-12-05 ENCOUNTER — Encounter (HOSPITAL_COMMUNITY)
Admission: RE | Admit: 2019-12-05 | Discharge: 2019-12-05 | Disposition: A | Discharge status: Home / Self Care | Payer: Medicaid Other | Source: Ambulatory Visit | Attending: Nephrology | Admitting: Nephrology

## 2019-12-05 ENCOUNTER — Other Ambulatory Visit: Payer: Self-pay

## 2019-12-05 VITALS — BP 156/95 | HR 94 | Temp 95.9°F | Resp 18

## 2019-12-05 DIAGNOSIS — Z794 Long term (current) use of insulin: Secondary | ICD-10-CM | POA: Diagnosis not present

## 2019-12-05 DIAGNOSIS — B182 Chronic viral hepatitis C: Secondary | ICD-10-CM | POA: Insufficient documentation

## 2019-12-05 DIAGNOSIS — Z7982 Long term (current) use of aspirin: Secondary | ICD-10-CM | POA: Diagnosis not present

## 2019-12-05 DIAGNOSIS — I5032 Chronic diastolic (congestive) heart failure: Secondary | ICD-10-CM

## 2019-12-05 DIAGNOSIS — Z888 Allergy status to other drugs, medicaments and biological substances status: Secondary | ICD-10-CM | POA: Insufficient documentation

## 2019-12-05 DIAGNOSIS — I110 Hypertensive heart disease with heart failure: Secondary | ICD-10-CM | POA: Diagnosis not present

## 2019-12-05 DIAGNOSIS — I10 Essential (primary) hypertension: Secondary | ICD-10-CM

## 2019-12-05 DIAGNOSIS — E1149 Type 2 diabetes mellitus with other diabetic neurological complication: Secondary | ICD-10-CM

## 2019-12-05 DIAGNOSIS — Z79899 Other long term (current) drug therapy: Secondary | ICD-10-CM | POA: Diagnosis not present

## 2019-12-05 DIAGNOSIS — D649 Anemia, unspecified: Secondary | ICD-10-CM | POA: Diagnosis not present

## 2019-12-05 DIAGNOSIS — E1140 Type 2 diabetes mellitus with diabetic neuropathy, unspecified: Secondary | ICD-10-CM | POA: Diagnosis not present

## 2019-12-05 LAB — GLUCOSE, CAPILLARY: Glucose-Capillary: 102 mg/dL — ABNORMAL HIGH (ref 70–99)

## 2019-12-05 LAB — POCT HEMOGLOBIN-HEMACUE: Hemoglobin: 10.5 g/dL — ABNORMAL LOW (ref 12.0–15.0)

## 2019-12-05 MED ORDER — FUROSEMIDE 40 MG PO TABS: 40.0000 mg | ORAL_TABLET | Freq: Two times a day (BID) | ORAL | 3 refills | Status: DC

## 2019-12-05 MED ORDER — AMLODIPINE BESYLATE 10 MG PO TABS: 10.0000 mg | ORAL_TABLET | Freq: Every day | ORAL | 3 refills | Status: DC

## 2019-12-05 MED ORDER — EPOETIN ALFA-EPBX 10000 UNIT/ML IJ SOLN
10000.0000 [IU] | INTRAMUSCULAR | Status: DC
  Administered 2019-12-05: 10000 [IU] via SUBCUTANEOUS

## 2019-12-05 MED ORDER — ATORVASTATIN CALCIUM 40 MG PO TABS: 40.0000 mg | ORAL_TABLET | Freq: Every day | ORAL | 3 refills | Status: DC

## 2019-12-05 MED ORDER — EPOETIN ALFA-EPBX 10000 UNIT/ML IJ SOLN
INTRAMUSCULAR | Status: AC
  Filled 2019-12-05: qty 1

## 2019-12-05 NOTE — Patient Instructions (Addendum)
Resume weighing daily and call for an overnight weight gain of > 2 pounds or a weekly weight gain of >5 pounds. 

## 2019-12-05 NOTE — Progress Notes (Signed)
[facility] - PHARMACIST COUNSELING NOTE  ADHERENCE ASSESSMENT  Adherence strategy: Patient endorses utilizing a pillbox at home. Patient brought medication bottles to visit today. She reports hydralazine makes her feel ill.   Do you ever forget to take your medication? _0 Yes (1) _1 No (0)  Do you ever skip doses due to side effects? _2 Yes (1) _3 No (0)  Do you have trouble affording your medicines? _4 Yes (1) _5 No (0)  Are you ever unable to pick up your medication due to transportation difficulties? _6 Yes (1) _7 No (0)  Do you ever stop taking your medications because you don't believe they are helping? _8 Yes (1) _9 No (0)  Total score 0   Recommendations given to patient about increasing adherence: Multiple pill bottles empty at today's visit (atorvastatin, amlodipine, furosemide, pregabalin). Appears that refills are still available on prescriptions. Advised patient to stay on top of medications and to call pharmacy 1-2 weeks before prescription runs out. Also noted that most prescriptions are for 30 day supplies - advised patient to inquire about 90 day supplies with pharmacy.  Guideline-Directed Medical Therapy/Evidence Based Medicine  ACE/ARB/ARNI: None Beta Blocker: Labetalol 100 mg BID Aldosterone Antagonist: None Diuretic: Furosemide 40 mg BID    SUBJECTIVE  HPI: Patient is a 59 y/o F with PMH as below who presents to CHF clinic for follow-up. Patient was recently hospitalized 4/19 - 4/22 for heart failure exacerbation.   Past Medical History:  Diagnosis Date  . Anemia   . CHF (congestive heart failure) ([place])   . Chronic hepatitis C without hepatic coma ([place]) 11/09/2016  . Diabetes mellitus   . Fibroids   . 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 Hypertension   . MRSA (methicillin resistant Staphylococcus aureus)   . Trichomonas   . VAGINITIS, BACTERIAL, RECURRENT 08/15/2007   Qualifier:  Diagnosis of  By: Radene Ou MD, [place]        OBJECTIVE   Vital signs: HR 85, BP 159/83, weight (pounds) 234 ECHO: Date 11/07/19, EF 60-65%, notes: Mild LVH, grade II diastolic dysfunction. No significant valvular disease.  BMP Latest Ref Rng & Units 11/08/2019 11/07/2019 11/06/2019  Glucose 70 - 99 mg/dL 274(H) 241(H) 240(H)  BUN 6 - 20 mg/dL 45(H) 45(H) 46(H)  Creatinine 0.44 - 1.00 mg/dL 1.68(H) 1.65(H) 1.70(H)  BUN/Creat Ratio 9 - 23 - - -  Sodium 135 - 145 mmol/L 138 140 140  Potassium 3.5 - 5.1 mmol/L 4.0 4.0 4.1  Chloride 98 - 111 mmol/L 105 108 110  CO2 22 - 32 mmol/L _10 Calcium 8.9 - 10.3 mg/dL 8.7(L) 8.6(L) 8.5(L)    ASSESSMENT  Patient is well appearing in no acute distress. She is intermittently coughing at today's visit. Endorses taking medications daily as prescribed - however, multiple pill bottles are empty at today's visit. Counseled patient on strategies to help with adherence. She reports side effects with hydralazine (feels ill / nauseous).   PLAN  1). Diastolic heart failure -Fluid management with furosemide 40 mg BID -She reports weighing herself weekly. Counseled patient on importance of daily weights and call parameters  2). Hypertension -Hypertensive today but out of amlodipine and furosemide -Antihypertensive regimen includes amlodipine 10 mg daily, hydralazine 100 mg TID, labetalol 100 mg BID, furosemide 40 mg BID -She is not checking her blood pressure at home. Advised patient to start checking BP and to keep a log  3). T2DM, un-controlled -Last HgbA1c was 10%  on 08/18/19 which was down from 13.9% on 04/10/19 -Antidiabetic regimen includes Lantus 65 units QHS + liraglutide 1.8 mg daily -Has glucometer at home but inconsistent with blood glucose checks -Atorvastatin 40 mg daily -Urine ACR was 1991 on 01/29/19. History of swelling with lisinopril. However, appears patient tolerated losartan in the past  4). CKD, stage 3b -Likely secondary to  hypertension and uncontrolled diabetes -Scr 1.68, K 4, BUN 45, eGFR 38 on 11/08/19  5). Anemia -Hgb 10.6 on 11/21/19 -Iron panel 11/08/19 with saturation ratio 14%, ferritin 133, folate 17.1 -Vitamin B12 461 on 08/18/19 -Ferrous sulfate 325 mg BID  6). Vitamin D deficiency -Vitamin D level 3.7 on 08/18/19 -Does not appear to be on supplementation at this time   Time spent: 25 minutes  [place] Resident 12/05/2019 10:32 AM    Current Outpatient Medications:  .  albuterol (VENTOLIN HFA) 108 (90 Base) MCG/ACT inhaler, Inhale 2 puffs into the lungs every 6 (six) hours as needed for wheezing or shortness of breath., Disp: 18 g, Rfl: 3 .  amLODipine (NORVASC) 10 MG tablet, Take 1 tablet (10 mg total) by mouth daily., Disp: 30 tablet, Rfl: 5 .  aspirin EC 81 MG EC tablet, Take 1 tablet (81 mg total) by mouth daily., Disp: 30 tablet, Rfl: 1 .  atorvastatin (LIPITOR) 40 MG tablet, Take 1 tablet (40 mg total) by mouth daily., Disp: 30 tablet, Rfl: 6 .  blood glucose meter kit and supplies KIT, Dispense based on patient and insurance preference. Use up to four times daily as directed. (FOR ICD-9 250.00, 250.01)., Disp: 1 each, Rfl: 2 .  ferrous sulfate 325 (65 FE) MG tablet, Take 1 tablet (325 mg total) by mouth 2 (two) times daily with a meal., Disp: 60 tablet, Rfl: 0 .  ferrous sulfate 325 (65 FE) MG tablet, Take 1 tablet (325 mg total) by mouth 2 (two) times daily with a meal., Disp: 60 tablet, Rfl: 3 .  furosemide (LASIX) 40 MG tablet, Take 1 tablet (40 mg total) by mouth 2 (two) times daily., Disp: 60 tablet, Rfl: 6 .  hydrALAZINE (APRESOLINE) 100 MG tablet, Take 1 tablet (100 mg total) by mouth 3 (three) times daily for 90 doses., Disp: 90 tablet, Rfl: 1 .  Insulin Glargine (LANTUS SOLOSTAR) 100 UNIT/ML Solostar Pen, Inject 65 Units into the skin at bedtime., Disp: 5 pen, Rfl: 5 .  ipratropium (ATROVENT) 0.06 % nasal spray, Place 2 sprays into both nostrils 3 (three) times  daily., Disp: 18 mL, Rfl: 0 .  labetalol (NORMODYNE) 100 MG tablet, Take 1 tablet (100 mg total) by mouth 2 (two) times daily., Disp: 180 tablet, Rfl: 3 .  liraglutide (VICTOZA) 18 MG/3ML SOPN, Start 0.56m SQ once a day for 7 days, then increase to 1.244monce a day for 7 days, then increase to 1.8 mg daily thereafter, Disp: 3 pen, Rfl: 6 .  loratadine (CLARITIN) 10 MG tablet, Take 1 tablet (10 mg total) by mouth daily., Disp: 30 tablet, Rfl: 0 .  LYRICA 75 MG capsule, Take 1 capsule (75 mg total) by mouth 2 (two) times daily., Disp: 60 capsule, Rfl: 5 .  Misc. Devices MISC, RoTax inspectorDx- CHF; Duration-lifetime. Weight 241 lbs, Disp: 1 each, Rfl: 0   COUNSELING POINTS/CLINICAL PEARLS  Furosemide  Drug causes sun-sensitivity. Advise patient to use sunscreen and avoid tanning beds. Patient should avoid activities requiring coordination until drug effects are realized, as drug may cause dizziness, vertigo, or  blurred vision. This drug may cause hyperglycemia, hyperuricemia, constipation, diarrhea, loss of appetite, nausea, vomiting, purpuric disorder, cramps, spasticity, asthenia, headache, paresthesia, or scaling eczema. Instruct patient to report unusual bleeding/bruising or signs/symptoms of hypotension, infection, pancreatitis, or ototoxicity (tinnitus, hearing impairment). Advise patient to report signs/symptoms of a severe skin reactions (flu-like symptoms, spreading red rash, or skin/mucous membrane blistering) or erythema multiforme. Instruct patient to eat high-potassium foods during drug therapy, as directed by healthcare professional.  Patient should not drink alcohol while taking this drug. DRUGS TO AVOID IN HEART FAILURE  Drug or Class Mechanism  Analgesics . NSAIDs . COX-2 inhibitors . Glucocorticoids  Sodium and water retention, increased systemic vascular resistance, decreased response to diuretics   Diabetes  Medications . Metformin . Thiazolidinediones o Rosiglitazone (Avandia) o Pioglitazone (Actos) . DPP4 Inhibitors o Saxagliptin (Onglyza) o Sitagliptin (Januvia)   Lactic acidosis Possible calcium channel blockade   Unknown  Antiarrhythmics . Class I  o Flecainide o Disopyramide . Class III o Sotalol . Other o Dronedarone  Negative inotrope, proarrhythmic   Proarrhythmic, beta blockade  Negative inotrope  Antihypertensives . Alpha Blockers o Doxazosin . Calcium Channel Blockers o Diltiazem o Verapamil o Nifedipine . Central Alpha Adrenergics o Moxonidine . Peripheral Vasodilators o Minoxidil  Increases renin and aldosterone  Negative inotrope    Possible sympathetic withdrawal  Unknown  Anti-infective . Itraconazole . Amphotericin B  Negative inotrope Unknown  Hematologic . Anagrelide . Cilostazol   Possible inhibition of PD IV Inhibition of PD III causing arrhythmias  Neurologic/Psychiatric . Stimulants . Anti-Seizure Drugs o Carbamazepine o Pregabalin . Antidepressants o Tricyclics o Citalopram . Parkinsons o Bromocriptine o Pergolide o Pramipexole . Antipsychotics o Clozapine . Antimigraine o Ergotamine o Methysergide . Appetite suppressants . Bipolar o Lithium  Peripheral alpha and beta agonist activity  Negative inotrope and chronotrope Calcium channel blockade  Negative inotrope, proarrhythmic Dose-dependent QT prolongation  Excessive serotonin activity/valvular damage Excessive serotonin activity/valvular damage Unknown  IgE mediated hypersensitivy, calcium channel blockade  Excessive serotonin activity/valvular damage Excessive serotonin activity/valvular damage Valvular damage  Direct myofibrillar degeneration, adrenergic stimulation  Antimalarials . Chloroquine . Hydroxychloroquine Intracellular inhibition of lysosomal enzymes  Urologic Agents . Alpha  Blockers o Doxazosin o Prazosin o Tamsulosin o Terazosin  Increased renin and aldosterone  Adapted from Page RL, et al. "Drugs That May Cause or Exacerbate Heart Failure: A Scientific Statement from the [place]." Circulation 2016; 283:M62-H47. DOI: 10.1161/CIR.0000000000000426   MEDICATION ADHERENCES TIPS AND STRATEGIES 1. Taking medication as prescribed improves patient outcomes in heart failure (reduces hospitalizations, improves symptoms, increases survival) 2. Side effects of medications can be managed by decreasing doses, switching agents, stopping drugs, or adding additional therapy. Please let someone in the [facility] know if you have having bothersome side effects so we can modify your regimen. Do not alter your medication regimen without talking to [place].  3. Medication reminders can help patients remember to take drugs on time. If you are missing or forgetting doses you can try linking behaviors, using pill boxes, or an electronic reminder like an alarm on your phone or an app. Some people can also get automated phone calls as medication reminders.

## 2019-12-05 NOTE — Progress Notes (Signed)
Patient ID: [name] [name], female    DOB: 05-05-1961, 59 y.o.   MRN: [id number]  HPI  [name] [name] is a 59 y/o female with a history of DM, HTN, anemia, chronic hepatitis C and chronic heart failure.   Echo report from 11/07/19 reviewed and showed an EF of 60-65% along with mild LVH. Echo report from 06/04/2019 reviewed and showed an EF of 55-60% along with mild [name].   Admitted 11/05/19 due to acute on chronic HF. Cardiology consult obtained. Initially needed IV lasix and then transitioned to oral diuretics. Was able to be weaned off her oxygen although overnight oximetry qualified her for nighttime oxygen. Discharged after 3 days.   She presents today for a follow-up visit with a chief complaint of minimal shortness of breath upon moderate exertion. She describes this as chronic in nature having been present for several months although is much better from her recent admission. She has associated fatigue, cough, rhinorrhea and neuropathy along with this. She denies any difficulty sleeping, dizziness, abdominal distention, palpitations, pedal edema, chest pain or wheezing.   Hasn't been weighing herself daily but does have scales at home. Hasn't been reading nutrition labels for sodium content.   Has been out of atorvastatin, furosemide and amlodipine for ~ the last week. Going to get them refilled today.   Past Medical History:  Diagnosis Date  . Anemia   . CHF (congestive heart failure) ([place])   . Chronic hepatitis C without hepatic coma ([place]) 11/09/2016  . Diabetes mellitus   . Fibroids   . 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 Hypertension   . MRSA (methicillin resistant Staphylococcus aureus)   . Trichomonas   . VAGINITIS, BACTERIAL, RECURRENT 08/15/2007   Qualifier: Diagnosis of  By: Radene Ou MD, [place]     Past Surgical History:  Procedure Laterality Date  . BREAST BIOPSY Left 2018  . CESAREAN SECTION     breech   Family History  Problem Relation  Age of Onset  . Colon cancer Mother   . Liver disease Sister   . Other Neg Hx   . Breast cancer Neg Hx   . Esophageal cancer Neg Hx   . Rectal cancer Neg Hx    Social History   Tobacco Use  . Smoking status: Never Smoker  . Smokeless tobacco: Never Used  Substance Use Topics  . Alcohol use: No   Allergies  Allergen Reactions  . Lisinopril Swelling   Prior to Admission medications   Medication Sig Start Date End Date Taking? Authorizing Provider  albuterol (VENTOLIN HFA) 108 (90 Base) MCG/ACT inhaler Inhale 2 puffs into the lungs every 6 (six) hours as needed for wheezing or shortness of breath. 04/23/19  Yes [name], MD  aspirin EC 81 MG EC tablet Take 1 tablet (81 mg total) by mouth daily. 11/08/19  Yes Fritzi Mandes, MD  blood glucose meter kit and supplies KIT Dispense based on patient and insurance preference. Use up to four times daily as directed. (FOR ICD-9 250.00, 250.01). 04/12/19  Yes Dustin Flock, MD  ferrous sulfate 325 (65 FE) MG tablet Take 1 tablet (325 mg total) by mouth 2 (two) times daily with a meal. 11/08/19  Yes Fritzi Mandes, MD  hydrALAZINE (APRESOLINE) 100 MG tablet Take 1 tablet (100 mg total) by mouth 3 (three) times daily for 90 doses. 11/08/19 12/08/19 Yes Fritzi Mandes, MD  Insulin Glargine (LANTUS SOLOSTAR) 100 UNIT/ML Solostar Pen Inject 65  Units into the skin at bedtime. 07/10/19  Yes [name], MD  labetalol (NORMODYNE) 100 MG tablet Take 1 tablet (100 mg total) by mouth 2 (two) times daily. 05/10/19  Yes Minus Breeding, MD  liraglutide (VICTOZA) 18 MG/3ML SOPN Start 0.74m SQ once a day for 7 days, then increase to 1.2107monce a day for 7 days, then increase to 1.8 mg daily thereafter 07/10/19  Yes NeCharlott RakesMD  Misc. Devices MISC Rollator with SeParamedicDx- CHF; Duration-lifetime. Weight 241 lbs 09/04/19  Yes Newlin, Enobong, MD  amLODipine (NORVASC) 10 MG tablet Take 1 tablet (10 mg total) by mouth daily. 07/10/19   NeCharlott RakesMD  atorvastatin (LIPITOR) 40 MG tablet Take 1 tablet (40 mg total) by mouth daily. Patient not taking: Reported on 12/05/2019 07/10/19   NeCharlott RakesMD  furosemide (LASIX) 40 MG tablet Take 1 tablet (40 mg total) by mouth 2 (two) times daily. 07/10/19 07/09/20  NeCharlott RakesMD  ipratropium (ATROVENT) 0.06 % nasal spray Place 2 sprays into both nostrils 3 (three) times daily. 08/19/19 09/18/19  WiWyvonnia DuskyMD  LYRICA 75 MG capsule Take 1 capsule (75 mg total) by mouth 2 (two) times daily. 06/29/19   NeCharlott RakesMD    Review of Systems  Constitutional: Positive for fatigue. Negative for appetite change.  HENT: Positive for rhinorrhea. Negative for congestion, postnasal drip and sore throat.   Eyes: Negative.   Respiratory: Positive for cough ("just today") and shortness of breath (with moderate exertion). Negative for chest tightness and wheezing.   Cardiovascular: Negative for chest pain, palpitations and leg swelling.  Gastrointestinal: Negative for abdominal distention and abdominal pain.  Endocrine: Negative.   Genitourinary: Negative.   Musculoskeletal: Negative for back pain and neck pain.  Skin: Negative.   Allergic/Immunologic: Negative.   Neurological: Positive for numbness (in legs due to diabetes). Negative for dizziness and light-headedness.  Hematological: Negative for adenopathy. Does not bruise/bleed easily.  Psychiatric/Behavioral: Negative for dysphoric mood and sleep disturbance (sleeping on 2 pillows). The patient is not nervous/anxious.    Vitals:   12/05/19 1027  BP: (!) 159/83  Pulse: 85  Resp: 18  SpO2: 94%  Weight: 234 lb 6 oz (106.3 kg)  Height: '5\' 4"'  (1.626 m)   Wt Readings from Last 3 Encounters:  12/05/19 234 lb 6 oz (106.3 kg)  11/08/19 248 lb 6.4 oz (112.7 kg)  09/04/19 241 lb (109.3 kg)   Lab Results  Component Value Date   CREATININE 1.68 (H) 11/08/2019   CREATININE 1.65 (H) 11/07/2019   CREATININE 1.70 (H)  11/06/2019    Physical Exam Vitals and nursing note reviewed.  Constitutional:      Appearance: Normal appearance.  HENT:     Head: Normocephalic and atraumatic.  Cardiovascular:     Rate and Rhythm: Normal rate and regular rhythm.  Pulmonary:     Effort: Pulmonary effort is normal. No respiratory distress.     Breath sounds: No wheezing or rales.  Abdominal:     General: There is no distension.     Palpations: Abdomen is soft.     Tenderness: There is no abdominal tenderness.  Musculoskeletal:        General: No tenderness.     Cervical back: Normal range of motion and neck supple.     Right lower leg: No edema.     Left lower leg: No edema.  Skin:    General: Skin is warm and dry.  Neurological:     General: No focal deficit present.     Mental Status: She is alert and oriented to person, place, and time.  Psychiatric:        Mood and Affect: Mood normal.        Behavior: Behavior normal.        Thought Content: Thought content normal.     Assessment & Plan:  1: Chronic heart failure with preserved ejection fraction- - NYHA class II - euvolemic today - not weighing daily but does have scales; instructed to begin weighing every morning so that she can call for an overnight weight gain of >2 pounds or a weekly weight gain of >5 pounds - weight 231.4 from last visit here 5 months ago - not adding salt but hasn't been looking at food labels; reviewed the importance of reading food labels closely so that she can follow a low sodium diet - saw cardiology (Hochrein) 08/02/19 - BNP 11/05/19 was 173.0 - wearing oxygen at 3L at bedtime - PharmD reconciled medications with the patient  2: HTN- - BP mildly elevated but she's been out of some medications for ~ 1 week; refilled amlodipine, furosemide and atorvastatin for 90 day supply - saw PCP (Newlin) 09/04/19 - BMP 11/08/19 reviewed and showed sodium 138, potassium 4.0, creatinine 1.68 and GFR 38  3: DM- - fasting glucose in  clinic today was 102 - A1c 08/18/19 was 10.0% - urine microalbumin / creatinine ratio 01/29/2019 was quite elevated at [place]; may have been on losartan in the past; will discuss with her if she had any difficulties taking it as she has had angioedema with lisinopril   Medication bottles were reviewed.  Return in 4 months or sooner for any questions/problems before then.

## 2019-12-07 ENCOUNTER — Telehealth: Payer: Self-pay | Admitting: Family Medicine

## 2019-12-07 NOTE — Telephone Encounter (Signed)
Pt call to request a refill for LYRICA 75 MG capsule [[id number] Please sent it to [facility] ([place] [place] - [address]  [address] [place], [place] [id number]

## 2019-12-10 ENCOUNTER — Telehealth: Payer: Self-pay

## 2019-12-10 NOTE — Telephone Encounter (Signed)
Per Medicaid, pt must try and fail gabapentin and duloxetine for coverage of Lyrica.

## 2019-12-10 NOTE — Telephone Encounter (Signed)
Can you look into what her pharmacy will cover.

## 2019-12-10 NOTE — Telephone Encounter (Signed)
Pt called says Lyrica is no covered by ins.  Pt also confirmed [place].  Please call pt to advise.

## 2019-12-11 DIAGNOSIS — E113513 Type 2 diabetes mellitus with proliferative diabetic retinopathy with macular edema, bilateral: Secondary | ICD-10-CM | POA: Diagnosis not present

## 2019-12-11 DIAGNOSIS — H4313 Vitreous hemorrhage, bilateral: Secondary | ICD-10-CM | POA: Diagnosis not present

## 2019-12-11 DIAGNOSIS — Z794 Long term (current) use of insulin: Secondary | ICD-10-CM | POA: Diagnosis not present

## 2019-12-11 DIAGNOSIS — Z961 Presence of intraocular lens: Secondary | ICD-10-CM | POA: Diagnosis not present

## 2019-12-11 NOTE — Telephone Encounter (Signed)
She has been on both medication, both prescribed by Dr.Newlin.

## 2019-12-11 NOTE — Telephone Encounter (Signed)
Will send this information to [facility] in pharmacy.   [name] [name], can we start a PA for this pt. She has tried and failed duloxetine and gabapentin.

## 2019-12-12 ENCOUNTER — Telehealth: Payer: Self-pay | Admitting: Pharmacist

## 2019-12-12 MED ORDER — PREGABALIN 75 MG PO CAPS
75.0000 mg | ORAL_CAPSULE | Freq: Two times a day (BID) | ORAL | 3 refills | Status: DC
Start: 2019-12-12 — End: 2020-05-09

## 2019-12-12 NOTE — Addendum Note (Signed)
Addended by: Karle Plumber B on: 12/12/2019 10:12 AM   Modules accepted: Orders

## 2019-12-12 NOTE — Telephone Encounter (Signed)
Received call from pharmacy. Medicaid approved pt for generic pregabalin. They will not cover brand Lyrica. However, pt's last script had a DAW1 for Lyrica. I reviewed her chart and she has taken generic before.   Will send to provider covering for PCP to see if we can get a new script for generic pregabalin.

## 2019-12-13 ENCOUNTER — Ambulatory Visit (HOSPITAL_BASED_OUTPATIENT_CLINIC_OR_DEPARTMENT_OTHER): Payer: Medicaid Other | Admitting: Family

## 2019-12-13 DIAGNOSIS — Z5329 Procedure and treatment not carried out because of patient's decision for other reasons: Secondary | ICD-10-CM

## 2019-12-13 NOTE — Progress Notes (Signed)
Patient did not show for appointment.   

## 2019-12-18 ENCOUNTER — Ambulatory Visit: Payer: Medicaid Other | Admitting: Cardiology

## 2019-12-18 DIAGNOSIS — I5032 Chronic diastolic (congestive) heart failure: Secondary | ICD-10-CM | POA: Diagnosis not present

## 2019-12-19 ENCOUNTER — Other Ambulatory Visit: Payer: Self-pay

## 2019-12-19 ENCOUNTER — Encounter (HOSPITAL_COMMUNITY)
Admission: RE | Admit: 2019-12-19 | Discharge: 2019-12-19 | Disposition: A | Discharge status: Home / Self Care | Payer: Medicaid Other | Source: Ambulatory Visit | Attending: Nephrology | Admitting: Nephrology

## 2019-12-19 VITALS — BP 167/89 | HR 87 | Temp 96.4°F | Resp 16

## 2019-12-19 DIAGNOSIS — D649 Anemia, unspecified: Secondary | ICD-10-CM | POA: Insufficient documentation

## 2019-12-19 LAB — IRON AND TIBC
Iron: 60 ug/dL (ref 28–170)
Saturation Ratios: 19 % (ref 10.4–31.8)
TIBC: 321 ug/dL (ref 250–450)
UIBC: 261 ug/dL

## 2019-12-19 LAB — POCT HEMOGLOBIN-HEMACUE: Hemoglobin: 9.8 g/dL — ABNORMAL LOW (ref 12.0–15.0)

## 2019-12-19 LAB — FERRITIN: Ferritin: 127 ng/mL (ref 11–307)

## 2019-12-19 MED ORDER — EPOETIN ALFA-EPBX 10000 UNIT/ML IJ SOLN
10000.0000 [IU] | INTRAMUSCULAR | Status: DC
  Administered 2019-12-19: 10000 [IU] via SUBCUTANEOUS

## 2019-12-19 MED ORDER — EPOETIN ALFA-EPBX 10000 UNIT/ML IJ SOLN
INTRAMUSCULAR | Status: AC
  Filled 2019-12-19: qty 1

## 2020-01-01 ENCOUNTER — Telehealth: Payer: Self-pay | Admitting: Family Medicine

## 2020-01-01 NOTE — Telephone Encounter (Signed)
Patient called in to inform pcp that she she has a open wound on the back of her leg. Please follow up at your earliest convenience.

## 2020-01-02 ENCOUNTER — Encounter (HOSPITAL_COMMUNITY)
Admission: RE | Admit: 2020-01-02 | Discharge: 2020-01-02 | Disposition: A | Discharge status: Home / Self Care | Payer: Medicaid Other | Source: Ambulatory Visit | Attending: Nephrology | Admitting: Nephrology

## 2020-01-02 ENCOUNTER — Other Ambulatory Visit: Payer: Self-pay

## 2020-01-02 VITALS — BP 149/86 | HR 85 | Temp 97.0°F

## 2020-01-02 DIAGNOSIS — D649 Anemia, unspecified: Secondary | ICD-10-CM | POA: Diagnosis not present

## 2020-01-02 LAB — POCT HEMOGLOBIN-HEMACUE: Hemoglobin: 10.2 g/dL — ABNORMAL LOW (ref 12.0–15.0)

## 2020-01-02 MED ORDER — EPOETIN ALFA-EPBX 10000 UNIT/ML IJ SOLN: 10000.0000 [IU] | INTRAMUSCULAR | Status: DC

## 2020-01-02 MED ORDER — EPOETIN ALFA-EPBX 10000 UNIT/ML IJ SOLN
INTRAMUSCULAR | Status: AC
  Filled 2020-01-02: qty 1

## 2020-01-02 NOTE — Telephone Encounter (Signed)
Patient was called and a voicemail was left to obtain more information, she was also informed to set an appointment to discuss matter.

## 2020-01-16 ENCOUNTER — Encounter (HOSPITAL_COMMUNITY)
Admission: RE | Admit: 2020-01-16 | Discharge: 2020-01-16 | Disposition: A | Discharge status: Home / Self Care | Payer: Medicaid Other | Source: Ambulatory Visit | Attending: Nephrology | Admitting: Nephrology

## 2020-01-16 ENCOUNTER — Other Ambulatory Visit: Payer: Self-pay

## 2020-01-16 VITALS — BP 145/74 | HR 84 | Resp 16

## 2020-01-16 DIAGNOSIS — D649 Anemia, unspecified: Secondary | ICD-10-CM | POA: Diagnosis not present

## 2020-01-16 LAB — POCT HEMOGLOBIN-HEMACUE: Hemoglobin: 10.3 g/dL — ABNORMAL LOW (ref 12.0–15.0)

## 2020-01-16 MED ORDER — EPOETIN ALFA-EPBX 10000 UNIT/ML IJ SOLN
INTRAMUSCULAR | Status: AC
  Administered 2020-01-16: 10000 [IU] via SUBCUTANEOUS
  Filled 2020-01-16: qty 1

## 2020-01-16 MED ORDER — EPOETIN ALFA-EPBX 10000 UNIT/ML IJ SOLN: 10000.0000 [IU] | INTRAMUSCULAR | Status: DC

## 2020-01-17 DIAGNOSIS — I5032 Chronic diastolic (congestive) heart failure: Secondary | ICD-10-CM | POA: Diagnosis not present

## 2020-01-30 ENCOUNTER — Encounter (HOSPITAL_COMMUNITY)
Admission: RE | Admit: 2020-01-30 | Discharge: 2020-01-30 | Disposition: A | Discharge status: Home / Self Care | Payer: Medicaid Other | Source: Ambulatory Visit | Attending: Nephrology | Admitting: Nephrology

## 2020-01-30 ENCOUNTER — Encounter: Payer: Self-pay | Admitting: Family Medicine

## 2020-01-30 ENCOUNTER — Other Ambulatory Visit: Payer: Self-pay

## 2020-01-30 ENCOUNTER — Ambulatory Visit: Payer: Medicaid Other | Attending: Family Medicine | Admitting: Family Medicine

## 2020-01-30 VITALS — BP 142/84 | HR 89 | Ht 64.0 in | Wt 243.0 lb

## 2020-01-30 VITALS — BP 118/79 | HR 94 | Temp 97.5°F | Resp 18

## 2020-01-30 DIAGNOSIS — Z794 Long term (current) use of insulin: Secondary | ICD-10-CM

## 2020-01-30 DIAGNOSIS — R21 Rash and other nonspecific skin eruption: Secondary | ICD-10-CM | POA: Diagnosis not present

## 2020-01-30 DIAGNOSIS — I110 Hypertensive heart disease with heart failure: Secondary | ICD-10-CM | POA: Diagnosis not present

## 2020-01-30 DIAGNOSIS — E1121 Type 2 diabetes mellitus with diabetic nephropathy: Secondary | ICD-10-CM

## 2020-01-30 DIAGNOSIS — D649 Anemia, unspecified: Secondary | ICD-10-CM | POA: Diagnosis not present

## 2020-01-30 DIAGNOSIS — I5032 Chronic diastolic (congestive) heart failure: Secondary | ICD-10-CM | POA: Diagnosis not present

## 2020-01-30 DIAGNOSIS — E1149 Type 2 diabetes mellitus with other diabetic neurological complication: Secondary | ICD-10-CM | POA: Diagnosis not present

## 2020-01-30 DIAGNOSIS — E1122 Type 2 diabetes mellitus with diabetic chronic kidney disease: Secondary | ICD-10-CM

## 2020-01-30 DIAGNOSIS — R600 Localized edema: Secondary | ICD-10-CM

## 2020-01-30 DIAGNOSIS — N1832 Chronic kidney disease, stage 3b: Secondary | ICD-10-CM | POA: Diagnosis not present

## 2020-01-30 LAB — POCT HEMOGLOBIN-HEMACUE: Hemoglobin: 10.4 g/dL — ABNORMAL LOW (ref 12.0–15.0)

## 2020-01-30 LAB — GLUCOSE, POCT (MANUAL RESULT ENTRY): POC Glucose: 118 mg/dl — AB (ref 70–99)

## 2020-01-30 LAB — POCT GLYCOSYLATED HEMOGLOBIN (HGB A1C): HbA1c, POC (controlled diabetic range): 6.8 % (ref 0.0–7.0)

## 2020-01-30 LAB — FERRITIN: Ferritin: 123 ng/mL (ref 11–307)

## 2020-01-30 LAB — IRON AND TIBC
Iron: 36 ug/dL (ref 28–170)
Saturation Ratios: 11 % (ref 10.4–31.8)
TIBC: 318 ug/dL (ref 250–450)
UIBC: 282 ug/dL

## 2020-01-30 MED ORDER — EPOETIN ALFA-EPBX 10000 UNIT/ML IJ SOLN
INTRAMUSCULAR | Status: AC
  Filled 2020-01-30: qty 1

## 2020-01-30 MED ORDER — EPOETIN ALFA-EPBX 10000 UNIT/ML IJ SOLN
10000.0000 [IU] | INTRAMUSCULAR | Status: DC
  Administered 2020-01-30: 10000 [IU] via SUBCUTANEOUS

## 2020-01-30 MED ORDER — VICTOZA 18 MG/3ML ~~LOC~~ SOPN: 1.8000 mg | PEN_INJECTOR | Freq: Every day | SUBCUTANEOUS | 6 refills | Status: DC

## 2020-01-30 NOTE — Progress Notes (Signed)
Has been having pain in the right side of abdomen.  Sore on the back of right leg.  Swelling in ankles and feet.

## 2020-01-30 NOTE — Progress Notes (Signed)
Subjective:  Patient ID: [name] [name], female    DOB: 1961-03-13  Age: 59 y.o. MRN: [id number]  CC: Diabetes   HPI [name] [name] is a 59 year old female with a history of type 2 diabetes mellitus (A1c6.8), diabetic neuropathy hypertension, diabetic retinopathy, hyperlipidemia, hepatitis C (completed treatment with harvoni), OSA, diastolic CHF (EF 55 to 09%), stage III CKDwho presents today for a follow up visit.  Legs have been swollen over the last 1-2 weeks and on further questioning she thinks she has been out of Furosemide.  Denies presence of dyspnea at rest or orthopnea. After her last hospitalization, she was discharged on oxygen and states she has been out and needs refills as she gets dyspneic on exertion. She has had a R popliteal sore x1 month and would like this checked out.  With regards to her diabetes mellitus she endorses compliance with her regimen and denies presence of hypoglycemia. Compliant with her statin and antihypertensive. Followed by nephrology for management of her chronic kidney disease and is also on iron replacement therapy. Past Medical History:  Diagnosis Date  . Anemia   . CHF (congestive heart failure) ([place])   . Chronic hepatitis C without hepatic coma ([place]) 11/09/2016  . Diabetes mellitus   . Fibroids   . 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 Hypertension   . MRSA (methicillin resistant Staphylococcus aureus)   . Trichomonas   . VAGINITIS, BACTERIAL, RECURRENT 08/15/2007   Qualifier: Diagnosis of  By: Radene Ou MD, [place]      Past Surgical History:  Procedure Laterality Date  . BREAST BIOPSY Left 2018  . CESAREAN SECTION     breech    Family History  Problem Relation Age of Onset  . Colon cancer Mother   . Liver disease Sister   . Other Neg Hx   . Breast cancer Neg Hx   . Esophageal cancer Neg Hx   . Rectal cancer Neg Hx     Allergies  Allergen Reactions  . Lisinopril Swelling     Outpatient Medications Prior to Visit  Medication Sig Dispense Refill  . albuterol (VENTOLIN HFA) 108 (90 Base) MCG/ACT inhaler Inhale 2 puffs into the lungs every 6 (six) hours as needed for wheezing or shortness of breath. 18 g 3  . amLODipine (NORVASC) 10 MG tablet Take 1 tablet (10 mg total) by mouth daily. 90 tablet 3  . aspirin EC 81 MG EC tablet Take 1 tablet (81 mg total) by mouth daily. 30 tablet 1  . atorvastatin (LIPITOR) 40 MG tablet Take 1 tablet (40 mg total) by mouth daily. 90 tablet 3  . blood glucose meter kit and supplies KIT Dispense based on patient and insurance preference. Use up to four times daily as directed. (FOR ICD-9 250.00, 250.01). 1 each 2  . ferrous sulfate 325 (65 FE) MG tablet Take 1 tablet (325 mg total) by mouth 2 (two) times daily with a meal. 60 tablet 3  . furosemide (LASIX) 40 MG tablet Take 1 tablet (40 mg total) by mouth 2 (two) times daily. 180 tablet 3  . Insulin Glargine (LANTUS SOLOSTAR) 100 UNIT/ML Solostar Pen Inject 65 Units into the skin at bedtime. 5 pen 5  . labetalol (NORMODYNE) 100 MG tablet Take 1 tablet (100 mg total) by mouth 2 (two) times daily. 180 tablet 3  . liraglutide (VICTOZA) 18 MG/3ML SOPN Start 0.67m SQ once a day for 7 days, then increase to 1.263monce  a day for 7 days, then increase to 1.8 mg daily thereafter 3 pen 6  . Misc. Devices MISC Rollator with Paramedic  Dx- CHF; Duration-lifetime. Weight 241 lbs 1 each 0  . pregabalin (LYRICA) 75 MG capsule Take 1 capsule (75 mg total) by mouth 2 (two) times daily. 60 capsule 3  . hydrALAZINE (APRESOLINE) 100 MG tablet Take 1 tablet (100 mg total) by mouth 3 (three) times daily for 90 doses. 90 tablet 1  . ipratropium (ATROVENT) 0.06 % nasal spray Place 2 sprays into both nostrils 3 (three) times daily. 18 mL 0   Facility-Administered Medications Prior to Visit  Medication Dose Route Frequency Provider Last Rate Last Admin  . epoetin alfa-epbx (RETACRIT) 83419 UNIT/ML  injection           . epoetin alfa-epbx (RETACRIT) injection 10,000 Units  10,000 Units Subcutaneous Q14 Days Claudia Desanctis, MD   10,000 Units at 01/30/20 1345     ROS Review of Systems  Constitutional: Negative for activity change, appetite change and fatigue.  HENT: Negative for congestion, sinus pressure and sore throat.   Eyes: Negative for visual disturbance.  Respiratory: Negative for cough, chest tightness, shortness of breath and wheezing.   Cardiovascular: Positive for leg swelling. Negative for chest pain and palpitations.  Gastrointestinal: Negative for abdominal distention, abdominal pain and constipation.  Endocrine: Negative for polydipsia.  Genitourinary: Negative for dysuria and frequency.  Musculoskeletal: Negative for arthralgias and back pain.  Skin: Positive for rash.  Neurological: Negative for tremors, light-headedness and numbness.  Hematological: Does not bruise/bleed easily.  Psychiatric/Behavioral: Negative for agitation and behavioral problems.    Objective:  BP (!) 142/84   Pulse 89   Ht 5' 4" (1.626 m)   Wt 243 lb (110.2 kg)   LMP 04/01/2010   SpO2 95%   BMI 41.71 kg/m   BP/Weight 01/30/2020 01/30/2020 01/07/2978  Systolic BP 892 119 417  Diastolic BP 79 84 74  Wt. (Lbs) - 243 -  BMI - 41.71 -      Physical Exam Constitutional:      Appearance: She is well-developed.  Neck:     Vascular: No JVD.  Cardiovascular:     Rate and Rhythm: Normal rate.     Heart sounds: Normal heart sounds. No murmur heard.   Pulmonary:     Effort: Pulmonary effort is normal.     Breath sounds: Normal breath sounds. No wheezing or rales.  Chest:     Chest wall: No tenderness.  Abdominal:     General: Bowel sounds are normal. There is no distension.     Palpations: Abdomen is soft. There is no mass.     Tenderness: There is no abdominal tenderness.  Musculoskeletal:        General: Normal range of motion.     Right lower leg: Edema (2+) present.      Left lower leg: Edema (2+) present.  Skin:    Comments: Scar on her right popliteal region which has healed with no evidence of inflammation  Neurological:     Mental Status: She is alert and oriented to person, place, and time.  Psychiatric:        Mood and Affect: Mood normal.     CMP Latest Ref Rng & Units 11/08/2019 11/07/2019 11/06/2019  Glucose 70 - 99 mg/dL 274(H) 241(H) 240(H)  BUN 6 - 20 mg/dL 45(H) 45(H) 46(H)  Creatinine 0.44 - 1.00 mg/dL 1.68(H) 1.65(H) 1.70(H)  Sodium 135 -  145 mmol/L 138 140 140  Potassium 3.5 - 5.1 mmol/L 4.0 4.0 4.1  Chloride 98 - 111 mmol/L 105 108 110  CO2 22 - 32 mmol/L _0 Calcium 8.9 - 10.3 mg/dL 8.7(L) 8.6(L) 8.5(L)  Total Protein 6.5 - 8.1 g/dL - - -  Total Bilirubin 0.3 - 1.2 mg/dL - - -  Alkaline Phos 38 - 126 U/L - - -  AST 15 - 41 U/L - - -  ALT 0 - 44 U/L - - -    Lipid Panel     Component Value Date/Time   CHOL 147 01/29/2019 1118   TRIG 102 01/29/2019 1118   HDL 59 01/29/2019 1118   CHOLHDL 2.5 01/29/2019 1118   CHOLHDL 3 06/18/2014 0951   VLDL 19.6 06/18/2014 0951   LDLCALC 68 01/29/2019 1118    CBC    Component Value Date/Time   WBC 4.7 11/05/2019 1851   RBC 3.10 (L) 11/05/2019 1851   HGB 10.4 (L) 01/30/2020 1347   HGB 9.8 (L) 07/10/2019 1544   HCT 27.1 (L) 11/05/2019 1851   HCT 30.9 (L) 07/10/2019 1544   PLT 161 11/05/2019 1851   PLT 171 07/10/2019 1544   MCV 87.4 11/05/2019 1851   MCV 86 07/10/2019 1544   MCH 28.1 11/05/2019 1851   MCHC 32.1 11/05/2019 1851   RDW 13.7 11/05/2019 1851   RDW 12.7 07/10/2019 1544   LYMPHSABS 1.3 07/10/2019 1544   MONOABS 0.4 06/05/2019 0442   EOSABS 0.0 07/10/2019 1544   BASOSABS 0.0 07/10/2019 1544    Lab Results  Component Value Date   HGBA1C 6.8 01/30/2020    Assessment & Plan:  1. Type 2 diabetes mellitus with stage 3b chronic kidney disease, with long-term current use of insulin (HCC) Controlled with A1c of 6.8 Continue current regimen Counseled on Diabetic  diet, my plate method, 852 minutes of moderate intensity exercise/week Blood sugar logs with fasting goals of 80-120 mg/dl, random of less than 180 and in the event of sugars less than 60 mg/dl or greater than 400 mg/dl encouraged to notify the clinic. Advised on the need for annual eye exams, annual foot exams, Pneumonia vaccine. - POCT glucose (manual entry) - POCT glycosylated hemoglobin (Hb A1C)  2. Pedal edema Due to running out of Lasix which I have refilled Compliance with Lasix has been emphasized Elevate feet, use compression stockings Low-sodium diet Consider discontinuing amlodipine if pedal edema persist  3. Type 2 diabetes mellitus with other neurologic complication, with long-term current use of insulin (HCC) Neuropathy stable on Lyrica - liraglutide (VICTOZA) 18 MG/3ML SOPN; Inject 0.3 mLs (1.8 mg total) into the skin daily.  Dispense: 3 pen; Refill: 6  4. Rash No evidence of infection Patient reassured  5. Hypertensive heart disease with chronic diastolic congestive heart failure ([place]) EF of 60 to 65% from 10/2019 She does have significant pedal edema due to running out of Lasix which I have refilled Advised to resume Lasix  Continue hydralazine; unable to tolerate ACE inhibitor due to allergy We have performed an oxygen determination test and her saturation does not qualify her for oxygen therapy.  No orders of the defined types were placed in this encounter.   Return in about 3 months (around 05/01/2020) for chronic disease management.       [name], MD, FAAFP. [facility] and [facility], [place]   01/30/2020, 4:33 PM

## 2020-01-30 NOTE — Patient Instructions (Signed)
Edema  Edema is when you have too much fluid in your body or under your skin. Edema may make your legs, feet, and ankles swell up. Swelling is also common in looser tissues, like around your eyes. This is a common condition. It gets more common as you get older. There are many possible causes of edema. Eating too much salt (sodium) and being on your feet or sitting for a long time can cause edema in your legs, feet, and ankles. Hot weather may make edema worse. Edema is usually painless. Your skin may look swollen or shiny. Follow these instructions at home:  Keep the swollen body part raised (elevated) above the level of your heart when you are sitting or lying down.  Do not sit still or stand for a long time.  Do not wear tight clothes. Do not wear garters on your upper legs.  Exercise your legs. This can help the swelling go down.  Wear elastic bandages or support stockings as told by your doctor.  Eat a low-salt (low-sodium) diet to reduce fluid as told by your doctor.  Depending on the cause of your swelling, you may need to limit how much fluid you drink (fluid restriction).  Take over-the-counter and prescription medicines only as told by your doctor. Contact a doctor if:  Treatment is not working.  You have heart, liver, or kidney disease and have symptoms of edema.  You have sudden and unexplained weight gain. Get help right away if:  You have shortness of breath or chest pain.  You cannot breathe when you lie down.  You have pain, redness, or warmth in the swollen areas.  You have heart, liver, or kidney disease and get edema all of a sudden.  You have a fever and your symptoms get worse all of a sudden. Summary  Edema is when you have too much fluid in your body or under your skin.  Edema may make your legs, feet, and ankles swell up. Swelling is also common in looser tissues, like around your eyes.  Raise (elevate) the swollen body part above the level of your  heart when you are sitting or lying down.  Follow your doctor's instructions about diet and how much fluid you can drink (fluid restriction). This information is not intended to replace advice given to you by your health care provider. Make sure you discuss any questions you have with your health care provider. Document Revised: 07/08/2017 Document Reviewed: 07/23/2016 Elsevier Patient Education  2020 Elsevier Inc.  

## 2020-02-13 ENCOUNTER — Other Ambulatory Visit: Payer: Self-pay

## 2020-02-13 ENCOUNTER — Encounter (HOSPITAL_COMMUNITY)
Admission: RE | Admit: 2020-02-13 | Discharge: 2020-02-13 | Disposition: A | Discharge status: Home / Self Care | Payer: Medicaid Other | Source: Ambulatory Visit | Attending: Nephrology | Admitting: Nephrology

## 2020-02-13 VITALS — BP 149/79 | HR 85 | Temp 98.2°F | Resp 18

## 2020-02-13 DIAGNOSIS — D649 Anemia, unspecified: Secondary | ICD-10-CM

## 2020-02-13 LAB — POCT HEMOGLOBIN-HEMACUE: Hemoglobin: 10.5 g/dL — ABNORMAL LOW (ref 12.0–15.0)

## 2020-02-13 MED ORDER — EPOETIN ALFA-EPBX 10000 UNIT/ML IJ SOLN
INTRAMUSCULAR | Status: AC
  Filled 2020-02-13: qty 1

## 2020-02-13 MED ORDER — EPOETIN ALFA-EPBX 10000 UNIT/ML IJ SOLN
10000.0000 [IU] | INTRAMUSCULAR | Status: DC
  Administered 2020-02-13: 10000 [IU] via SUBCUTANEOUS

## 2020-02-17 DIAGNOSIS — I5032 Chronic diastolic (congestive) heart failure: Secondary | ICD-10-CM | POA: Diagnosis not present

## 2020-02-19 DIAGNOSIS — N1832 Chronic kidney disease, stage 3b: Secondary | ICD-10-CM | POA: Diagnosis not present

## 2020-02-19 DIAGNOSIS — E559 Vitamin D deficiency, unspecified: Secondary | ICD-10-CM | POA: Diagnosis not present

## 2020-02-19 DIAGNOSIS — R809 Proteinuria, unspecified: Secondary | ICD-10-CM | POA: Diagnosis not present

## 2020-02-19 DIAGNOSIS — E1129 Type 2 diabetes mellitus with other diabetic kidney complication: Secondary | ICD-10-CM | POA: Diagnosis not present

## 2020-02-19 DIAGNOSIS — D631 Anemia in chronic kidney disease: Secondary | ICD-10-CM | POA: Diagnosis not present

## 2020-02-19 DIAGNOSIS — E1122 Type 2 diabetes mellitus with diabetic chronic kidney disease: Secondary | ICD-10-CM | POA: Diagnosis not present

## 2020-02-19 DIAGNOSIS — I5032 Chronic diastolic (congestive) heart failure: Secondary | ICD-10-CM | POA: Diagnosis not present

## 2020-02-19 DIAGNOSIS — I129 Hypertensive chronic kidney disease with stage 1 through stage 4 chronic kidney disease, or unspecified chronic kidney disease: Secondary | ICD-10-CM | POA: Diagnosis not present

## 2020-02-21 ENCOUNTER — Other Ambulatory Visit: Payer: Self-pay | Admitting: Family Medicine

## 2020-02-21 DIAGNOSIS — Z794 Long term (current) use of insulin: Secondary | ICD-10-CM

## 2020-02-21 DIAGNOSIS — E1149 Type 2 diabetes mellitus with other diabetic neurological complication: Secondary | ICD-10-CM

## 2020-02-21 DIAGNOSIS — E11311 Type 2 diabetes mellitus with unspecified diabetic retinopathy with macular edema: Secondary | ICD-10-CM

## 2020-02-27 ENCOUNTER — Other Ambulatory Visit: Payer: Self-pay

## 2020-02-27 ENCOUNTER — Encounter (HOSPITAL_COMMUNITY)
Admission: RE | Admit: 2020-02-27 | Discharge: 2020-02-27 | Disposition: A | Discharge status: Home / Self Care | Payer: Medicaid Other | Source: Ambulatory Visit | Attending: Nephrology | Admitting: Nephrology

## 2020-02-27 VITALS — BP 141/77 | HR 85 | Temp 97.7°F | Resp 18

## 2020-02-27 DIAGNOSIS — D649 Anemia, unspecified: Secondary | ICD-10-CM | POA: Insufficient documentation

## 2020-02-27 LAB — IRON AND TIBC
Iron: 50 ug/dL (ref 28–170)
Saturation Ratios: 15 % (ref 10.4–31.8)
TIBC: 333 ug/dL (ref 250–450)
UIBC: 283 ug/dL

## 2020-02-27 LAB — FERRITIN: Ferritin: 103 ng/mL (ref 11–307)

## 2020-02-27 LAB — POCT HEMOGLOBIN-HEMACUE: Hemoglobin: 10.4 g/dL — ABNORMAL LOW (ref 12.0–15.0)

## 2020-02-27 MED ORDER — EPOETIN ALFA-EPBX 10000 UNIT/ML IJ SOLN
10000.0000 [IU] | INTRAMUSCULAR | Status: DC
  Administered 2020-02-27: 10000 [IU] via SUBCUTANEOUS

## 2020-02-27 MED ORDER — EPOETIN ALFA-EPBX 10000 UNIT/ML IJ SOLN
INTRAMUSCULAR | Status: AC
  Filled 2020-02-27: qty 1

## 2020-03-11 ENCOUNTER — Other Ambulatory Visit: Payer: Self-pay | Admitting: Family Medicine

## 2020-03-11 DIAGNOSIS — E11311 Type 2 diabetes mellitus with unspecified diabetic retinopathy with macular edema: Secondary | ICD-10-CM

## 2020-03-11 DIAGNOSIS — Z794 Long term (current) use of insulin: Secondary | ICD-10-CM

## 2020-03-11 DIAGNOSIS — E1149 Type 2 diabetes mellitus with other diabetic neurological complication: Secondary | ICD-10-CM

## 2020-03-12 ENCOUNTER — Other Ambulatory Visit: Payer: Self-pay

## 2020-03-12 ENCOUNTER — Encounter (HOSPITAL_COMMUNITY)
Admission: RE | Admit: 2020-03-12 | Discharge: 2020-03-12 | Disposition: A | Discharge status: Home / Self Care | Payer: Medicaid Other | Source: Ambulatory Visit | Attending: Nephrology | Admitting: Nephrology

## 2020-03-12 VITALS — BP 136/72 | HR 83 | Temp 97.5°F | Resp 18

## 2020-03-12 DIAGNOSIS — D649 Anemia, unspecified: Secondary | ICD-10-CM

## 2020-03-12 LAB — POCT HEMOGLOBIN-HEMACUE: Hemoglobin: 9.9 g/dL — ABNORMAL LOW (ref 12.0–15.0)

## 2020-03-12 MED ORDER — EPOETIN ALFA-EPBX 10000 UNIT/ML IJ SOLN
10000.0000 [IU] | INTRAMUSCULAR | Status: DC
  Administered 2020-03-12: 10000 [IU] via SUBCUTANEOUS

## 2020-03-12 MED ORDER — EPOETIN ALFA-EPBX 10000 UNIT/ML IJ SOLN
INTRAMUSCULAR | Status: AC
  Filled 2020-03-12: qty 1

## 2020-03-19 DIAGNOSIS — I5032 Chronic diastolic (congestive) heart failure: Secondary | ICD-10-CM | POA: Diagnosis not present

## 2020-03-20 DIAGNOSIS — E1129 Type 2 diabetes mellitus with other diabetic kidney complication: Secondary | ICD-10-CM | POA: Diagnosis not present

## 2020-03-25 ENCOUNTER — Other Ambulatory Visit (HOSPITAL_COMMUNITY): Payer: Self-pay | Admitting: *Deleted

## 2020-03-26 ENCOUNTER — Inpatient Hospital Stay (HOSPITAL_COMMUNITY): Admission: RE | Admit: 2020-03-26 | Payer: Medicaid Other | Source: Ambulatory Visit

## 2020-03-31 ENCOUNTER — Encounter (HOSPITAL_COMMUNITY)
Admission: RE | Admit: 2020-03-31 | Discharge: 2020-03-31 | Disposition: A | Discharge status: Home / Self Care | Payer: Medicaid Other | Source: Ambulatory Visit | Attending: Nephrology | Admitting: Nephrology

## 2020-03-31 ENCOUNTER — Other Ambulatory Visit: Payer: Self-pay

## 2020-03-31 VITALS — BP 173/74 | HR 82 | Temp 97.3°F | Resp 18

## 2020-03-31 DIAGNOSIS — D649 Anemia, unspecified: Secondary | ICD-10-CM | POA: Diagnosis not present

## 2020-03-31 LAB — POCT HEMOGLOBIN-HEMACUE: Hemoglobin: 10.4 g/dL — ABNORMAL LOW (ref 12.0–15.0)

## 2020-03-31 MED ORDER — SODIUM CHLORIDE 0.9 % IV SOLN
510.0000 mg | Freq: Once | INTRAVENOUS | Status: AC
  Administered 2020-03-31: 510 mg via INTRAVENOUS
  Filled 2020-03-31: qty 17

## 2020-03-31 MED ORDER — EPOETIN ALFA-EPBX 10000 UNIT/ML IJ SOLN
INTRAMUSCULAR | Status: AC
  Filled 2020-03-31: qty 1

## 2020-03-31 MED ORDER — EPOETIN ALFA-EPBX 10000 UNIT/ML IJ SOLN
10000.0000 [IU] | INTRAMUSCULAR | Status: DC
  Administered 2020-03-31: 10000 [IU] via SUBCUTANEOUS

## 2020-04-07 ENCOUNTER — Ambulatory Visit: Payer: Medicaid Other | Admitting: Family

## 2020-04-07 NOTE — Progress Notes (Deleted)
Patient ID: Arijana Narayan, female    DOB: 1961-06-24, 59 y.o.   MRN: [id number]  HPI  Ms Bambach is a 59 y/o female with a history of DM, HTN, anemia, chronic hepatitis C and chronic heart failure.   Echo report from 11/07/19 reviewed and showed an EF of 60-65% along with mild LVH. Echo report from 06/04/2019 reviewed and showed an EF of 55-60% along with mild MS.   Admitted 11/05/19 due to acute on chronic HF. Cardiology consult obtained. Initially needed IV lasix and then transitioned to oral diuretics. Was able to be weaned off her oxygen although overnight oximetry qualified her for nighttime oxygen. Discharged after 3 days.   She presents today for a follow-up visit with a chief complaint of   Past Medical History:  Diagnosis Date  . Anemia   . CHF (congestive heart failure) ([place])   . Chronic hepatitis C without hepatic coma ([place]) 11/09/2016  . Diabetes mellitus   . Fibroids   . 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 Hypertension   . MRSA (methicillin resistant Staphylococcus aureus)   . Trichomonas   . VAGINITIS, BACTERIAL, RECURRENT 08/15/2007   Qualifier: Diagnosis of  By: Radene Ou MD, [place]     Past Surgical History:  Procedure Laterality Date  . BREAST BIOPSY Left 2018  . CESAREAN SECTION     breech   Family History  Problem Relation Age of Onset  . Colon cancer Mother   . Liver disease Sister   . Other Neg Hx   . Breast cancer Neg Hx   . Esophageal cancer Neg Hx   . Rectal cancer Neg Hx    Social History   Tobacco Use  . Smoking status: Never Smoker  . Smokeless tobacco: Never Used  Substance Use Topics  . Alcohol use: No   Allergies  Allergen Reactions  . Lisinopril Swelling     Review of Systems  Constitutional: Positive for fatigue. Negative for appetite change.  HENT: Positive for rhinorrhea. Negative for congestion, postnasal drip and sore throat.   Eyes: Negative.   Respiratory: Positive for cough ("just  today") and shortness of breath (with moderate exertion). Negative for chest tightness and wheezing.   Cardiovascular: Negative for chest pain, palpitations and leg swelling.  Gastrointestinal: Negative for abdominal distention and abdominal pain.  Endocrine: Negative.   Genitourinary: Negative.   Musculoskeletal: Negative for back pain and neck pain.  Skin: Negative.   Allergic/Immunologic: Negative.   Neurological: Positive for numbness (in legs due to diabetes). Negative for dizziness and light-headedness.  Hematological: Negative for adenopathy. Does not bruise/bleed easily.  Psychiatric/Behavioral: Negative for dysphoric mood and sleep disturbance (sleeping on 2 pillows). The patient is not nervous/anxious.      Physical Exam Vitals and nursing note reviewed.  Constitutional:      Appearance: Normal appearance.  HENT:     Head: Normocephalic and atraumatic.  Cardiovascular:     Rate and Rhythm: Normal rate and regular rhythm.  Pulmonary:     Effort: Pulmonary effort is normal. No respiratory distress.     Breath sounds: No wheezing or rales.  Abdominal:     General: There is no distension.     Palpations: Abdomen is soft.     Tenderness: There is no abdominal tenderness.  Musculoskeletal:        General: No tenderness.     Cervical back: Normal range of motion and neck supple.  Right lower leg: No edema.     Left lower leg: No edema.  Skin:    General: Skin is warm and dry.  Neurological:     General: No focal deficit present.     Mental Status: She is alert and oriented to person, place, and time.  Psychiatric:        Mood and Affect: Mood normal.        Behavior: Behavior normal.        Thought Content: Thought content normal.     Assessment & Plan:  1: Chronic heart failure with preserved ejection fraction with structural changes- - NYHA class II - euvolemic today - not weighing daily but does have scales; instructed to begin weighing every morning so  that she can call for an overnight weight gain of >2 pounds or a weekly weight gain of >5 pounds - weight 234.6 from last visit here 5 months ago - not adding salt but hasn't been looking at food labels; reviewed the importance of reading food labels closely so that she can follow a low sodium diet - had angioedema with lisinopril - saw cardiology (Hochrein) 08/02/19 - BNP 11/05/19 was 173.0 - wearing oxygen at 3L at bedtime   2: HTN- - BP  - saw PCP (Newlin) 01/30/20 - BMP 11/08/19 reviewed and showed sodium 138, potassium 4.0, creatinine 1.68 and GFR 38  3: DM- - fasting glucose in clinic today was  - A1c 08/18/19 was 10.0% - urine microalbumin / creatinine ratio 01/29/2019 was quite elevated at 1991   Medication bottles were reviewed.

## 2020-04-08 ENCOUNTER — Ambulatory Visit: Payer: Medicaid Other | Admitting: Family

## 2020-04-09 ENCOUNTER — Encounter (HOSPITAL_COMMUNITY): Payer: Medicaid Other

## 2020-04-14 ENCOUNTER — Inpatient Hospital Stay (HOSPITAL_COMMUNITY): Admission: RE | Admit: 2020-04-14 | Payer: Medicaid Other | Source: Ambulatory Visit

## 2020-04-14 ENCOUNTER — Other Ambulatory Visit: Payer: Self-pay | Admitting: Family Medicine

## 2020-04-14 DIAGNOSIS — E1149 Type 2 diabetes mellitus with other diabetic neurological complication: Secondary | ICD-10-CM

## 2020-04-14 DIAGNOSIS — E11311 Type 2 diabetes mellitus with unspecified diabetic retinopathy with macular edema: Secondary | ICD-10-CM

## 2020-04-14 DIAGNOSIS — Z794 Long term (current) use of insulin: Secondary | ICD-10-CM

## 2020-04-14 NOTE — Telephone Encounter (Signed)
Requested Prescriptions  Pending Prescriptions Disp Refills   LANTUS SOLOSTAR 100 UNIT/ML Solostar Pen [Pharmacy Med Name: Lantus SoloStar 100 UNIT/ML Subcutaneous Solution Pen-injector] 15 mL 4    Sig: INJECT 65 UNITS SUBCUTANEOUSLY AT BEDTIME     Endocrinology:  Diabetes - Insulins Passed - 04/14/2020  2:39 PM      Passed - HBA1C is between 0 and 7.9 and within 180 days    HbA1c, POC (controlled diabetic range)  Date Value Ref Range Status  01/30/2020 6.8 0.0 - 7.0 % Final         Passed - Valid encounter within last 6 months    Recent Outpatient Visits          2 months ago Type 2 diabetes mellitus with stage 3b chronic kidney disease, with long-term current use of insulin ([place])   [facility] [place], [place], MD   4 months ago No-show for appointment   [place], Amy J, NP   7 months ago 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

## 2020-04-16 NOTE — Progress Notes (Deleted)
Patient ID: [name] [name], female    DOB: July 12, 1961, 59 y.o.   MRN: [id number]  HPI  [name] [name] is a 59 y/o female with a history of DM, HTN, anemia, chronic hepatitis C and chronic heart failure.   Echo report from 11/07/19 reviewed and showed an EF of 60-65% along with mild LVH. Echo report from 06/04/2019 reviewed and showed an EF of 55-60% along with mild [name].   Admitted 11/05/19 due to acute on chronic HF. Cardiology consult obtained. Initially needed IV lasix and then transitioned to oral diuretics. Was able to be weaned off her oxygen although overnight oximetry qualified her for nighttime oxygen. Discharged after 3 days.   She presents today for a follow-up visit with a chief complaint of   Past Medical History:  Diagnosis Date  . Anemia   . CHF (congestive heart failure) ([place])   . Chronic hepatitis C without hepatic coma ([place]) 11/09/2016  . Diabetes mellitus   . Fibroids   . 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 Hypertension   . MRSA (methicillin resistant Staphylococcus aureus)   . Trichomonas   . VAGINITIS, BACTERIAL, RECURRENT 08/15/2007   Qualifier: Diagnosis of  By: Radene Ou MD, [place]     Past Surgical History:  Procedure Laterality Date  . BREAST BIOPSY Left 2018  . CESAREAN SECTION     breech   Family History  Problem Relation Age of Onset  . Colon cancer Mother   . Liver disease Sister   . Other Neg Hx   . Breast cancer Neg Hx   . Esophageal cancer Neg Hx   . Rectal cancer Neg Hx    Social History   Tobacco Use  . Smoking status: Never Smoker  . Smokeless tobacco: Never Used  Substance Use Topics  . Alcohol use: No   Allergies  Allergen Reactions  . Lisinopril Swelling     Review of Systems  Constitutional: Positive for fatigue. Negative for appetite change.  HENT: Positive for rhinorrhea. Negative for congestion, postnasal drip and sore throat.   Eyes: Negative.   Respiratory: Positive for cough ("just  today") and shortness of breath (with moderate exertion). Negative for chest tightness and wheezing.   Cardiovascular: Negative for chest pain, palpitations and leg swelling.  Gastrointestinal: Negative for abdominal distention and abdominal pain.  Endocrine: Negative.   Genitourinary: Negative.   Musculoskeletal: Negative for back pain and neck pain.  Skin: Negative.   Allergic/Immunologic: Negative.   Neurological: Positive for numbness (in legs due to diabetes). Negative for dizziness and light-headedness.  Hematological: Negative for adenopathy. Does not bruise/bleed easily.  Psychiatric/Behavioral: Negative for dysphoric mood and sleep disturbance (sleeping on 2 pillows). The patient is not nervous/anxious.      Physical Exam Vitals and nursing note reviewed.  Constitutional:      Appearance: Normal appearance.  HENT:     Head: Normocephalic and atraumatic.  Cardiovascular:     Rate and Rhythm: Normal rate and regular rhythm.  Pulmonary:     Effort: Pulmonary effort is normal. No respiratory distress.     Breath sounds: No wheezing or rales.  Abdominal:     General: There is no distension.     Palpations: Abdomen is soft.     Tenderness: There is no abdominal tenderness.  Musculoskeletal:        General: No tenderness.     Cervical back: Normal range of motion and neck supple.  Right lower leg: No edema.     Left lower leg: No edema.  Skin:    General: Skin is warm and dry.  Neurological:     General: No focal deficit present.     Mental Status: She is alert and oriented to person, place, and time.  Psychiatric:        Mood and Affect: Mood normal.        Behavior: Behavior normal.        Thought Content: Thought content normal.     Assessment & Plan:  1: Chronic heart failure with preserved ejection fraction with structural changes- - NYHA class II - euvolemic today - not weighing daily but does have scales; instructed to begin weighing every morning so  that she can call for an overnight weight gain of >2 pounds or a weekly weight gain of >5 pounds - weight 234.6 from last visit here 5 months ago - not adding salt but hasn't been looking at food labels; reviewed the importance of reading food labels closely so that she can follow a low sodium diet - had angioedema with lisinopril - saw cardiology (Hochrein) 08/02/19 - BNP 11/05/19 was 173.0 - wearing oxygen at 3L at bedtime   2: HTN- - BP  - saw PCP (Newlin) 01/30/20 - BMP 11/08/19 reviewed and showed sodium 138, potassium 4.0, creatinine 1.68 and GFR 38  3: DM- - fasting glucose in clinic today was  - A1c 08/18/19 was 10.0% - urine microalbumin / creatinine ratio 01/29/2019 was quite elevated at 1991   Medication bottles were reviewed.

## 2020-04-17 ENCOUNTER — Telehealth: Payer: Self-pay | Admitting: Family

## 2020-04-17 ENCOUNTER — Ambulatory Visit: Payer: Medicaid Other | Admitting: Family

## 2020-04-17 NOTE — Telephone Encounter (Signed)
Patient did not show for her Heart Failure Clinic appointment on 04/17/20. Will attempt to reschedule.

## 2020-04-18 DIAGNOSIS — I5032 Chronic diastolic (congestive) heart failure: Secondary | ICD-10-CM | POA: Diagnosis not present

## 2020-04-24 DIAGNOSIS — E113513 Type 2 diabetes mellitus with proliferative diabetic retinopathy with macular edema, bilateral: Secondary | ICD-10-CM | POA: Diagnosis not present

## 2020-04-24 DIAGNOSIS — Z961 Presence of intraocular lens: Secondary | ICD-10-CM | POA: Diagnosis not present

## 2020-04-24 DIAGNOSIS — E113511 Type 2 diabetes mellitus with proliferative diabetic retinopathy with macular edema, right eye: Secondary | ICD-10-CM | POA: Diagnosis not present

## 2020-04-28 ENCOUNTER — Encounter (HOSPITAL_COMMUNITY): Payer: Medicaid Other

## 2020-05-06 ENCOUNTER — Telehealth: Payer: Self-pay | Admitting: Family Medicine

## 2020-05-06 NOTE — Telephone Encounter (Signed)
Medication Refill - Medication: Gabapentin  Has the patient contacted their pharmacy? Yes.   (Agent: If no, request that the patient contact the pharmacy for the refill.) (Agent: If yes, when and what did the pharmacy advise?)  Preferred Pharmacy (with phone number or street name): [facility] ([place]), [place] - [address]  Agent: Please be advised that RX refills may take up to 3 business days. We ask that you follow-up with your pharmacy.

## 2020-05-06 NOTE — Telephone Encounter (Signed)
Not on medication list  Please advise

## 2020-05-07 NOTE — Telephone Encounter (Signed)
She is on Lyrica and should not be on Gabapentin as well.

## 2020-05-08 NOTE — Telephone Encounter (Signed)
We are working on a prior authorization for her Lyrica and she should be able to get get it.

## 2020-05-08 NOTE — Telephone Encounter (Signed)
Pregabalin is still on the preferred list but now requires a PA.   [name] [name],   Are we able to start a PA for her?

## 2020-05-08 NOTE — Telephone Encounter (Signed)
Can you please look into this?  She has been on Lyrica chronically and states insurance no longer covers it.  Thank you

## 2020-05-08 NOTE — Telephone Encounter (Signed)
Thank you :)

## 2020-05-08 NOTE — Telephone Encounter (Signed)
Here PA'S are only good for 6 months so I will request a new one.

## 2020-05-09 ENCOUNTER — Telehealth: Payer: Self-pay

## 2020-05-09 ENCOUNTER — Telehealth: Payer: Self-pay | Admitting: Family

## 2020-05-09 MED ORDER — PREGABALIN 75 MG PO CAPS
75.0000 mg | ORAL_CAPSULE | Freq: Two times a day (BID) | ORAL | 3 refills | Status: DC
Start: 2020-05-09 — End: 2020-06-03

## 2020-05-09 NOTE — Telephone Encounter (Signed)
LVM with patient in attempt to reschedule a no show CHF Clinic appointment from September.   Kendell Gammon, NT

## 2020-05-09 NOTE — Telephone Encounter (Signed)
Requesting refill on Lyrica.

## 2020-05-09 NOTE — Addendum Note (Signed)
Addended by: Karle Plumber B on: 05/09/2020 06:33 PM   Modules accepted: Orders

## 2020-05-09 NOTE — Telephone Encounter (Signed)
Patient called in regards to her Lyrica states that her nerves are acting up. Patient would like to know if PCP could send in a generic version of the medication. She would like a call back from PCP.   Please advise [phone]

## 2020-05-09 NOTE — Telephone Encounter (Signed)
I contacted patient's pharmacy regarding a PA for pt's Pregabalin and pharmacy states that patient is out of refills.  The medication is not needing a PA at this time.  If appropriate, can you send in a refill to the patient's pharmacy?  [facility]

## 2020-05-12 NOTE — Telephone Encounter (Signed)
Patient was called and a voicemail was left informing patient that her requested medication has been sent to pharmacy.

## 2020-05-19 DIAGNOSIS — I5032 Chronic diastolic (congestive) heart failure: Secondary | ICD-10-CM | POA: Diagnosis not present

## 2020-05-20 ENCOUNTER — Ambulatory Visit: Payer: Medicaid Other | Admitting: Family

## 2020-05-26 ENCOUNTER — Other Ambulatory Visit: Payer: Self-pay

## 2020-05-26 NOTE — Patient Outreach (Signed)
Care Coordination  05/26/2020  [name] [name] [birth year] [id number]  An unsuccessful telephone outreach was attempted today. The patient was referred to the case management team for assistance with care management and care coordination.   Follow Up Plan: The Managed Medicaid care management team will reach out to the patient again over the next 7 days.   [name] RN, BSN [place]  [facility] - Managed Medicaid High Risk [phone]

## 2020-05-26 NOTE — Patient Instructions (Signed)
Hi Ms. Wiggs we were unable to reach you today,  as a part of your Medicaid benefit, you are eligible for care management and care coordination services at no cost or copay. I was unable to reach you by phone today but would be happy to help you with your health related needs. Please feel free to call me at [phone].  A member of the Managed Medicaid care management team will reach out to you again over the next 7 days.   [name] RN, BSN [place]  [facility] - Managed Medicaid High Risk [phone].

## 2020-05-27 ENCOUNTER — Other Ambulatory Visit: Payer: Self-pay

## 2020-05-27 ENCOUNTER — Ambulatory Visit: Payer: Medicaid Other | Admitting: Family

## 2020-05-27 ENCOUNTER — Other Ambulatory Visit: Payer: Self-pay | Admitting: Family

## 2020-05-27 ENCOUNTER — Encounter: Payer: Self-pay | Admitting: Family

## 2020-05-27 ENCOUNTER — Ambulatory Visit
Admission: RE | Admit: 2020-05-27 | Discharge: 2020-05-27 | Disposition: A | Discharge status: Home / Self Care | Payer: Medicaid Other | Source: Ambulatory Visit | Attending: Family | Admitting: Family

## 2020-05-27 VITALS — BP 158/86 | HR 81 | Resp 18 | Ht 64.0 in | Wt 251.0 lb

## 2020-05-27 DIAGNOSIS — Z794 Long term (current) use of insulin: Secondary | ICD-10-CM | POA: Insufficient documentation

## 2020-05-27 DIAGNOSIS — E119 Type 2 diabetes mellitus without complications: Secondary | ICD-10-CM | POA: Insufficient documentation

## 2020-05-27 DIAGNOSIS — I5033 Acute on chronic diastolic (congestive) heart failure: Secondary | ICD-10-CM | POA: Diagnosis present

## 2020-05-27 DIAGNOSIS — Z79899 Other long term (current) drug therapy: Secondary | ICD-10-CM | POA: Insufficient documentation

## 2020-05-27 DIAGNOSIS — Z7901 Long term (current) use of anticoagulants: Secondary | ICD-10-CM | POA: Insufficient documentation

## 2020-05-27 DIAGNOSIS — I110 Hypertensive heart disease with heart failure: Secondary | ICD-10-CM | POA: Insufficient documentation

## 2020-05-27 DIAGNOSIS — D6489 Other specified anemias: Secondary | ICD-10-CM | POA: Insufficient documentation

## 2020-05-27 DIAGNOSIS — Z888 Allergy status to other drugs, medicaments and biological substances status: Secondary | ICD-10-CM | POA: Insufficient documentation

## 2020-05-27 DIAGNOSIS — B182 Chronic viral hepatitis C: Secondary | ICD-10-CM | POA: Insufficient documentation

## 2020-05-27 DIAGNOSIS — Z7982 Long term (current) use of aspirin: Secondary | ICD-10-CM | POA: Insufficient documentation

## 2020-05-27 DIAGNOSIS — D649 Anemia, unspecified: Secondary | ICD-10-CM | POA: Insufficient documentation

## 2020-05-27 DIAGNOSIS — I5032 Chronic diastolic (congestive) heart failure: Secondary | ICD-10-CM

## 2020-05-27 DIAGNOSIS — N1831 Chronic kidney disease, stage 3a: Secondary | ICD-10-CM

## 2020-05-27 DIAGNOSIS — E1122 Type 2 diabetes mellitus with diabetic chronic kidney disease: Secondary | ICD-10-CM

## 2020-05-27 DIAGNOSIS — I10 Essential (primary) hypertension: Secondary | ICD-10-CM

## 2020-05-27 LAB — GLUCOSE, CAPILLARY: Glucose-Capillary: 99 mg/dL (ref 70–99)

## 2020-05-27 LAB — BRAIN NATRIURETIC PEPTIDE: B Natriuretic Peptide: 21 pg/mL (ref 0.0–100.0)

## 2020-05-27 LAB — BASIC METABOLIC PANEL
Anion gap: 9 (ref 5–15)
BUN: 26 mg/dL — ABNORMAL HIGH (ref 6–20)
CO2: 22 mmol/L (ref 22–32)
Calcium: 8.5 mg/dL — ABNORMAL LOW (ref 8.9–10.3)
Chloride: 110 mmol/L (ref 98–111)
Creatinine, Ser: 1.41 mg/dL — ABNORMAL HIGH (ref 0.44–1.00)
GFR, Estimated: 43 mL/min — ABNORMAL LOW (ref >60)
Glucose, Bld: 91 mg/dL (ref 70–99)
Potassium: 3.7 mmol/L (ref 3.5–5.1)
Sodium: 141 mmol/L (ref 135–145)

## 2020-05-27 MED ORDER — POTASSIUM CHLORIDE CRYS ER 20 MEQ PO TBCR
EXTENDED_RELEASE_TABLET | ORAL | Status: AC
  Administered 2020-05-27: 40 meq via ORAL
  Filled 2020-05-27: qty 2

## 2020-05-27 MED ORDER — POTASSIUM CHLORIDE CRYS ER 20 MEQ PO TBCR: 40.0000 meq | EXTENDED_RELEASE_TABLET | Freq: Once | ORAL | Status: AC

## 2020-05-27 MED ORDER — FUROSEMIDE 10 MG/ML IJ SOLN: 80.0000 mg | Freq: Once | INTRAMUSCULAR | Status: AC

## 2020-05-27 MED ORDER — FUROSEMIDE 10 MG/ML IJ SOLN
INTRAMUSCULAR | Status: AC
  Administered 2020-05-27: 80 mg via INTRAVENOUS
  Filled 2020-05-27: qty 8

## 2020-05-27 MED ORDER — SODIUM CHLORIDE FLUSH 0.9 % IV SOLN
INTRAVENOUS | Status: AC
  Filled 2020-05-27: qty 30

## 2020-05-27 NOTE — Progress Notes (Signed)
Patient ID: [name] [name], female    DOB: 07-07-1961, 59 y.o.   MRN: [id number]  Congestive Heart Failure Associated symptoms include fatigue and shortness of breath (with moderate exertion). Pertinent negatives include no abdominal pain, chest pain or palpitations.    [name] [name] is a 59 y/o female with a history of DM, HTN, anemia, chronic hepatitis C and chronic heart failure.   Echo report from 11/07/19 reviewed and showed an EF of 60-65% along with mild LVH. Echo report from 06/04/2019 reviewed and showed an EF of 55-60% along with mild [name].   Patient has not been admitted or seen in ED in last 6 months  She presents today for a follow-up visit with a chief complaint of moderate shortness of breath upon minimal exertion. She describes this as acute in nature having been worsening from her baseline over the last month. Reports she has not been eating well, has felt more lethargic and has had swollen legs.   Hasn't been weighing herself daily but does have scales at home. Hasn't been reading nutrition labels for sodium content.    Past Medical History:  Diagnosis Date  . Anemia   . CHF (congestive heart failure) ([place])   . Chronic hepatitis C without hepatic coma ([place]) 11/09/2016  . Diabetes mellitus   . Fibroids   . 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 Hypertension   . MRSA (methicillin resistant Staphylococcus aureus)   . Trichomonas   . VAGINITIS, BACTERIAL, RECURRENT 08/15/2007   Qualifier: Diagnosis of  By: Radene Ou MD, [place]     Past Surgical History:  Procedure Laterality Date  . BREAST BIOPSY Left 2018  . CESAREAN SECTION     breech   Family History  Problem Relation Age of Onset  . Colon cancer Mother   . Liver disease Sister   . Other Neg Hx   . Breast cancer Neg Hx   . Esophageal cancer Neg Hx   . Rectal cancer Neg Hx    Social History   Tobacco Use  . Smoking status: Never Smoker  . Smokeless tobacco: Never Used   Substance Use Topics  . Alcohol use: No   Allergies  Allergen Reactions  . Lisinopril Swelling   Prior to Admission medications   Medication Sig Start Date End Date Taking? Authorizing Provider  amLODipine (NORVASC) 10 MG tablet Take 1 tablet (10 mg total) by mouth daily. 12/05/19  Yes Darylene Price A, FNP  atorvastatin (LIPITOR) 40 MG tablet Take 1 tablet (40 mg total) by mouth daily. 12/05/19  Yes Darylene Price A, FNP  blood glucose meter kit and supplies KIT Dispense based on patient and insurance preference. Use up to four times daily as directed. (FOR ICD-9 250.00, 250.01). 04/12/19  Yes Dustin Flock, MD  ferrous sulfate 325 (65 FE) MG tablet Take 1 tablet (325 mg total) by mouth 2 (two) times daily with a meal. 11/08/19  Yes Fritzi Mandes, MD  furosemide (LASIX) 40 MG tablet Take 1 tablet (40 mg total) by mouth 2 (two) times daily. 12/05/19 12/04/20 Yes Hackney, Otila Kluver A, FNP  hydrALAZINE (APRESOLINE) 100 MG tablet Take 1 tablet (100 mg total) by mouth 3 (three) times daily for 90 doses. 11/08/19 05/27/20 Yes Fritzi Mandes, MD  labetalol (NORMODYNE) 100 MG tablet Take 1 tablet (100 mg total) by mouth 2 (two) times daily. 05/10/19  Yes Minus Breeding, MD  LANTUS SOLOSTAR 100 UNIT/ML Solostar Pen INJECT 65 UNITS SUBCUTANEOUSLY  AT BEDTIME 04/14/20  Yes Newlin, Enobong, MD  liraglutide (VICTOZA) 18 MG/3ML SOPN Inject 0.3 mLs (1.8 mg total) into the skin daily. 01/30/20  Yes [name], MD  Misc. Devices MISC Rollator with Paramedic  Dx- CHF; Duration-lifetime. Weight 241 lbs 09/04/19  Yes Newlin, Charlane Ferretti, MD  pregabalin (LYRICA) 75 MG capsule Take 1 capsule (75 mg total) by mouth 2 (two) times daily. 05/09/20  Yes [name], MD  aspirin EC 81 MG EC tablet Take 1 tablet (81 mg total) by mouth daily. Patient not taking: Reported on 05/27/2020 11/08/19   Fritzi Mandes, MD     Review of Systems  Constitutional: Positive for fatigue. Negative for appetite change.  HENT: Positive for  rhinorrhea. Negative for congestion, postnasal drip and sore throat.   Eyes: Negative.   Respiratory: Positive for cough ("just today") and shortness of breath (with moderate exertion). Negative for chest tightness and wheezing.   Cardiovascular: Positive for leg swelling (Bilateral legs). Negative for chest pain and palpitations.  Gastrointestinal: Negative for abdominal distention and abdominal pain.  Endocrine: Negative.   Genitourinary: Negative.   Musculoskeletal: Negative for back pain and neck pain.  Skin: Negative.   Allergic/Immunologic: Negative.   Neurological: Positive for numbness (in legs due to diabetes). Negative for dizziness and light-headedness.  Hematological: Negative for adenopathy. Does not bruise/bleed easily.  Psychiatric/Behavioral: Negative for dysphoric mood and sleep disturbance (sleeping on 2 pillows). The patient is not nervous/anxious.    Vitals:   05/27/20 1121  BP: (!) 158/86  Pulse: 81  Resp: 18  SpO2: 99%  Weight: 251 lb (113.9 kg)  Height: '5\' 4"'  (1.626 m)   Wt Readings from Last 3 Encounters:  05/27/20 251 lb (113.9 kg)  01/30/20 243 lb (110.2 kg)  12/05/19 234 lb 6 oz (106.3 kg)   Lab Results  Component Value Date   CREATININE 1.68 (H) 11/08/2019   CREATININE 1.65 (H) 11/07/2019   CREATININE 1.70 (H) 11/06/2019    Physical Exam Vitals and nursing note reviewed.  Constitutional:      Appearance: Normal appearance. She is obese.  HENT:     Head: Normocephalic and atraumatic.  Cardiovascular:     Rate and Rhythm: Normal rate and regular rhythm.  Pulmonary:     Effort: Pulmonary effort is normal. No respiratory distress.     Breath sounds: Rales present. No wheezing.  Abdominal:     General: There is no distension.     Palpations: Abdomen is soft.     Tenderness: There is no abdominal tenderness.  Musculoskeletal:        General: Swelling and tenderness present. No deformity or signs of injury.     Cervical back: Normal range of  motion and neck supple.     Right lower leg: Edema (+3 pitting edema and tight legs) present.     Left lower leg: Edema (+3 bilateral leg swelling) present.  Skin:    General: Skin is warm and dry.  Neurological:     General: No focal deficit present.     Mental Status: She is alert and oriented to person, place, and time.  Psychiatric:        Mood and Affect: Mood normal.        Behavior: Behavior normal.        Thought Content: Thought content normal.     Assessment & Plan:  1: Acute on Chronic heart failure with preserved ejection fraction- - NYHA class II - In fluid overload  in periphery today - not weighing daily but does have scales; instructed to begin weighing every morning so that she can call for an overnight weight gain of >2 pounds or a weekly weight gain of >5 pounds - weight 251 which is up 17lbs since last visit here 6 months ago - not adding salt but hasn't been looking at food labels; reviewed the importance of reading food labels closely so that she can follow a low sodium diet  -Requested diet recall from patient and she reports eating high carb foods as well as high sugar foods (Breads, Potatoes, Juices) - saw cardiology (Hochrein) 08/02/19 - BNP 11/05/19 was 173.0 -Patient sent today to same day surgery to receive IV Lasix. Patient to have labs checked today as well.  2: HTN- - BP mildly elevated  - saw PCP (Newlin) 01/30/20 - BMP 11/08/19 reviewed and showed sodium 138, potassium 4.0, creatinine 1.68 and GFR 38 - BMP and BNP ordered today  3: DM- - fasting glucose in clinic today was 99 - Last A1c (Point of care) was 6.8, improved from previous A1c of 10 - urine microalbumin / creatinine ratio 01/29/2019 was quite elevated at [place]; may have been on losartan in the past; will discuss with her if she had any difficulties taking it as she has had angioedema with lisinopril   Medication bottles were not brought to apt today. Advised her to bring them to all  appointments. Medication list reviewed with patient   Return tomorrow for recheck following IV lasix today

## 2020-05-28 ENCOUNTER — Other Ambulatory Visit: Payer: Self-pay

## 2020-05-28 ENCOUNTER — Ambulatory Visit: Payer: Medicaid Other | Attending: Family | Admitting: Family

## 2020-05-28 ENCOUNTER — Encounter: Payer: Self-pay | Admitting: Family

## 2020-05-28 VITALS — BP 161/96 | HR 86 | Resp 20 | Ht 64.0 in | Wt 248.5 lb

## 2020-05-28 DIAGNOSIS — Z794 Long term (current) use of insulin: Secondary | ICD-10-CM | POA: Diagnosis not present

## 2020-05-28 DIAGNOSIS — I110 Hypertensive heart disease with heart failure: Secondary | ICD-10-CM | POA: Insufficient documentation

## 2020-05-28 DIAGNOSIS — I5032 Chronic diastolic (congestive) heart failure: Secondary | ICD-10-CM | POA: Diagnosis not present

## 2020-05-28 DIAGNOSIS — N1831 Chronic kidney disease, stage 3a: Secondary | ICD-10-CM

## 2020-05-28 DIAGNOSIS — E1122 Type 2 diabetes mellitus with diabetic chronic kidney disease: Secondary | ICD-10-CM

## 2020-05-28 DIAGNOSIS — E119 Type 2 diabetes mellitus without complications: Secondary | ICD-10-CM | POA: Diagnosis not present

## 2020-05-28 DIAGNOSIS — Z7901 Long term (current) use of anticoagulants: Secondary | ICD-10-CM | POA: Diagnosis not present

## 2020-05-28 DIAGNOSIS — Z79899 Other long term (current) drug therapy: Secondary | ICD-10-CM | POA: Insufficient documentation

## 2020-05-28 DIAGNOSIS — Z7982 Long term (current) use of aspirin: Secondary | ICD-10-CM | POA: Insufficient documentation

## 2020-05-28 DIAGNOSIS — R0602 Shortness of breath: Secondary | ICD-10-CM | POA: Insufficient documentation

## 2020-05-28 DIAGNOSIS — I890 Lymphedema, not elsewhere classified: Secondary | ICD-10-CM | POA: Insufficient documentation

## 2020-05-28 DIAGNOSIS — I10 Essential (primary) hypertension: Secondary | ICD-10-CM

## 2020-05-28 LAB — GLUCOSE, CAPILLARY: Glucose-Capillary: 127 mg/dL — ABNORMAL HIGH (ref 70–99)

## 2020-05-28 NOTE — Progress Notes (Signed)
Patient ID: [name] [name], female    DOB: 21-Jan-1961, 59 y.o.   MRN: [id number]  Congestive Heart Failure Associated symptoms include fatigue and shortness of breath. Pertinent negatives include no abdominal pain, chest pain or palpitations.  Shortness of Breath Associated symptoms include leg swelling (Bilateral legs) and rhinorrhea. Pertinent negatives include no abdominal pain, chest pain, neck pain, sore throat or wheezing.    [name] [name] is a 59 y/o female with a history of DM, HTN, anemia, chronic hepatitis C and chronic heart failure.   Echo report from 11/07/19 reviewed and showed an EF of 60-65% along with mild LVH. Echo report from 06/04/2019 reviewed and showed an EF of 55-60% along with mild [name].   Patient has not been admitted or seen in ED in last 6 months  She presents today for a follow-up visit with a chief complaint of moderate shortness of breath upon minimal exertion. She describes this as acute in nature having been worsening from her baseline over the last month. Reports she has not been eating well, has felt more lethargic and has had swollen legs.   Hasn't been weighing herself daily but does have scales at home. Hasn't been reading nutrition labels for sodium content.    Past Medical History:  Diagnosis Date  . Anemia   . CHF (congestive heart failure) ([place])   . Chronic hepatitis C without hepatic coma ([place]) 11/09/2016  . Diabetes mellitus   . Fibroids   . 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 Hypertension   . MRSA (methicillin resistant Staphylococcus aureus)   . Trichomonas   . VAGINITIS, BACTERIAL, RECURRENT 08/15/2007   Qualifier: Diagnosis of  By: Radene Ou MD, [place]     Past Surgical History:  Procedure Laterality Date  . BREAST BIOPSY Left 2018  . CESAREAN SECTION     breech   Family History  Problem Relation Age of Onset  . Colon cancer Mother   . Liver disease Sister   . Other Neg Hx   . Breast cancer Neg Hx    . Esophageal cancer Neg Hx   . Rectal cancer Neg Hx    Social History   Tobacco Use  . Smoking status: Never Smoker  . Smokeless tobacco: Never Used  Substance Use Topics  . Alcohol use: No   Allergies  Allergen Reactions  . Lisinopril Swelling   Prior to Admission medications   Medication Sig Start Date End Date Taking? Authorizing Provider  amLODipine (NORVASC) 10 MG tablet Take 1 tablet (10 mg total) by mouth daily. 12/05/19  Yes Darylene Price A, FNP  atorvastatin (LIPITOR) 40 MG tablet Take 1 tablet (40 mg total) by mouth daily. 12/05/19  Yes Darylene Price A, FNP  blood glucose meter kit and supplies KIT Dispense based on patient and insurance preference. Use up to four times daily as directed. (FOR ICD-9 250.00, 250.01). 04/12/19  Yes Dustin Flock, MD  ferrous sulfate 325 (65 FE) MG tablet Take 1 tablet (325 mg total) by mouth 2 (two) times daily with a meal. 11/08/19  Yes Fritzi Mandes, MD  furosemide (LASIX) 40 MG tablet Take 1 tablet (40 mg total) by mouth 2 (two) times daily. 12/05/19 12/04/20 Yes Hackney, Otila Kluver A, FNP  hydrALAZINE (APRESOLINE) 100 MG tablet Take 1 tablet (100 mg total) by mouth 3 (three) times daily for 90 doses. 11/08/19 05/27/20 Yes Fritzi Mandes, MD  labetalol (NORMODYNE) 100 MG tablet Take 1 tablet (100 mg  total) by mouth 2 (two) times daily. 05/10/19  Yes Hochrein, Jeneen Rinks, MD  LANTUS SOLOSTAR 100 UNIT/ML Solostar Pen INJECT 65 UNITS SUBCUTANEOUSLY AT BEDTIME 04/14/20  Yes Newlin, Enobong, MD  liraglutide (VICTOZA) 18 MG/3ML SOPN Inject 0.3 mLs (1.8 mg total) into the skin daily. 01/30/20  Yes [name], MD  Misc. Devices MISC Rollator with Paramedic  Dx- CHF; Duration-lifetime. Weight 241 lbs 09/04/19  Yes Newlin, Charlane Ferretti, MD  pregabalin (LYRICA) 75 MG capsule Take 1 capsule (75 mg total) by mouth 2 (two) times daily. 05/09/20  Yes [name], MD  aspirin EC 81 MG EC tablet Take 1 tablet (81 mg total) by mouth daily. Patient not taking:  Reported on 05/27/2020 11/08/19   Fritzi Mandes, MD     Review of Systems  Constitutional: Positive for fatigue. Negative for appetite change.  HENT: Positive for rhinorrhea. Negative for congestion, postnasal drip and sore throat.   Eyes: Negative.   Respiratory: Positive for shortness of breath. Negative for cough, chest tightness and wheezing.   Cardiovascular: Positive for leg swelling (Bilateral legs). Negative for chest pain and palpitations.  Gastrointestinal: Negative for abdominal distention and abdominal pain.  Endocrine: Negative.   Genitourinary: Negative.   Musculoskeletal: Negative for back pain and neck pain.  Skin: Negative.   Allergic/Immunologic: Negative.   Neurological: Positive for numbness (in legs due to diabetes). Negative for dizziness and light-headedness.  Hematological: Negative for adenopathy. Does not bruise/bleed easily.  Psychiatric/Behavioral: Negative for dysphoric mood and sleep disturbance (sleeping on 2 pillows). The patient is not nervous/anxious.    Vitals:   05/28/20 1117  BP: (!) 161/96  Pulse: 86  Resp: 20  SpO2: 99%  Weight: 248 lb 8 oz (112.7 kg)  Height: _0  (1.626 m)   Wt Readings from Last 3 Encounters:  05/28/20 248 lb 8 oz (112.7 kg)  05/27/20 251 lb (113.9 kg)  01/30/20 243 lb (110.2 kg)   Lab Results  Component Value Date   CREATININE 1.41 (H) 05/27/2020   CREATININE 1.68 (H) 11/08/2019   CREATININE 1.65 (H) 11/07/2019    Physical Exam Vitals and nursing note reviewed.  Constitutional:      Appearance: Normal appearance. She is obese.  HENT:     Head: Normocephalic and atraumatic.  Cardiovascular:     Rate and Rhythm: Normal rate and regular rhythm.  Pulmonary:     Effort: Pulmonary effort is normal. No respiratory distress.     Breath sounds: Wheezing (Upper lobes) present. No rales.  Abdominal:     General: There is no distension.     Palpations: Abdomen is soft.     Tenderness: There is no abdominal tenderness.   Musculoskeletal:        General: Swelling and tenderness present. No deformity or signs of injury.     Cervical back: Normal range of motion and neck supple.     Right lower leg: Tenderness present. Edema (+2 pitting edema and tight legs) present.     Left lower leg: Tenderness present. Edema (+2 bilateral leg swelling) present.  Skin:    General: Skin is warm and dry.  Neurological:     General: No focal deficit present.     Mental Status: She is alert and oriented to person, place, and time.  Psychiatric:        Mood and Affect: Mood normal.        Behavior: Behavior normal.        Thought Content: Thought content normal.  Assessment & Plan:  1: Chronic heart failure with preserved ejection fraction- - NYHA class II - +2 pitting noted in bilateral legs - not weighing daily but does have scales; instructed to begin weighing every morning so that she can call for an overnight weight gain of >2 pounds or a weekly weight gain of >5 pounds - weight down 3 pounds from last visit here yesterday - not adding salt but hasn't been looking at food labels; reviewed the importance of reading food labels closely so that she can follow a low sodium diet  - saw cardiology (Hochrein) 08/02/19 - BNP 05/27/20 was 21.0 -Patient has minimal improvement from IV lasix in legs. Noted to be less tight.  -Encouraged patient to call her care manager to see about getting fitted for compression socks and to utilize Ace wraps at home for Lymphedema. -Gave patient the number for Patient outreach as they have been trying to reach her  2: HTN- - BP elevated today  - saw PCP Margarita Rana) 01/30/20, messaged today regarding potential for Lymphedema clinic as well as getting compression socks as she sees them on 06/03/20 - BMP 05/27/20 reviewed and showed sodium 141, potassium 3.7, creatinine 1.41 and GFR 43 - Reiterated importance of following a low sodium diet and preserving kidney function  3: DM- - non fasting  glucose in clinic today was 127 - Last A1c (Point of care) was 6.8, improved from previous A1c of 10   Medication bottles were not brought to appt today. Advised her to bring them to all appointments. Medication list reviewed with patient   Return in 3 months or sooner if needed.

## 2020-05-28 NOTE — Patient Instructions (Addendum)

## 2020-05-28 NOTE — Patient Instructions (Signed)
Hi [name] [name] you for speaking with me today.  [name] [name] was given information about Medicaid Managed Care team care coordination services as a part of their Healthy [facility] Medicaid benefit. [name] [name] verbally consented to engagement with the [facility] Managed Care team.   For questions related to your [facility], please call: [phone] or visit the homepage here: [email or web address]  If you would like to schedule transportation through your [facility], please call the following number at least 2 days in advance of your appointment: [phone]  Goals Addressed              This Visit's Progress   .  " I have swelling" (pt-stated)          [place] (see longitudinal plan of care for additional care plan information)  Current Barriers:  . Patient states leg swelling-went to see provider yesterday.  Nurse Case Manager Clinical Goal(s):  Marland Kitchen Over the next 30 days, patient will verbalize understanding of plan for decreased swelling. . Over the next 30 days, patient will work with provider to address needs related to swelling.  Interventions:  . Inter-disciplinary care team collaboration (see longitudinal plan of care) . Reviewed medications with patient. . Discussed plans with patient for ongoing care management follow up and provided patient with direct contact information for care management team  Plan:  . Patient will follow up with provider and fill all prescriptions . RNCM will follow up with patient within 14 days.   Initial goal documentation        Patient verbalizes understanding of instructions provided today.   The Managed Medicaid care management team will reach out to the patient again over the next 14 days.   [name] RN, BSN [place]  [facility] -  Managed Medicaid High Risk [phone]

## 2020-05-28 NOTE — Patient Outreach (Addendum)
Care Coordination - Case Manager  05/28/2020  Autym Siess 04/15/1961 [id number]  Subjective:  Darsha Zumstein is an 59 y.o. year old female who is a primary patient of [name], MD.  Ms. Syler was given information about Medicaid Managed Care team care coordination services today. Tamala Ser agreed to services and verbal consent obtained  Review of patient status, laboratory and other test data was performed as part of evaluation for provision of services.  SDOH: SDOH Screenings   Alcohol Screen:   . Last Alcohol Screening Score (AUDIT): Not on file  Depression (PHQ2-9): Low Risk   . PHQ-2 Score: 4  Financial Resource Strain:   . Difficulty of Paying Living Expenses: Not on file  Food Insecurity:   . Worried About Charity fundraiser in the Last Year: Not on file  . Ran Out of Food in the Last Year: Not on file  Housing:   . Last Housing Risk Score: Not on file  Physical Activity: Unknown  . Days of Exercise per Week: 0 days  . Minutes of Exercise per Session: Not on file  Social Connections: Moderately Isolated  . Frequency of Communication with Friends and Family: More than three times a week  . Frequency of Social Gatherings with Friends and Family: More than three times a week  . Attends Religious Services: More than 4 times per year  . Active Member of Clubs or Organizations: No  . Attends Archivist Meetings: Not asked  . Marital Status: Separated  Stress: No Stress Concern Present  . Feeling of Stress : Not at all  Tobacco Use: Low Risk   . Smoking Tobacco Use: Never Smoker  . Smokeless Tobacco Use: Never Used  Transportation Needs:   . Film/video editor (Medical): Not on file  . Lack of Transportation (Non-Medical): Not on file     Objective:    Allergies  Allergen Reactions  . Lisinopril Swelling    Medications:    Medications Reviewed Today    Reviewed by [name], FNP (Family Nurse Practitioner) on  05/28/20 at 1323  Med List Status: <None>  Medication Order Taking? Sig Documenting Provider Last Dose Status Informant  amLODipine (NORVASC) 10 MG tablet [id number] Yes Take 1 tablet (10 mg total) by mouth daily. Darylene Price A, FNP Taking Active   aspirin EC 81 MG EC tablet [id number] Yes Take 1 tablet (81 mg total) by mouth daily.  Patient not taking: Reported on 05/28/2020   Fritzi Mandes, MD Taking Active   atorvastatin (LIPITOR) 40 MG tablet [id number] Yes Take 1 tablet (40 mg total) by mouth daily. [name], FNP Taking Active   blood glucose meter kit and supplies KIT [id number] Yes Dispense based on patient and insurance preference. Use up to four times daily as directed. (FOR ICD-9 250.00, 250.01). Dustin Flock, MD Taking Active Self  ferrous sulfate 325 (65 FE) MG tablet [id number] Yes Take 1 tablet (325 mg total) by mouth 2 (two) times daily with a meal. Fritzi Mandes, MD Taking Active   furosemide (LASIX) 40 MG tablet [id number] Yes Take 1 tablet (40 mg total) by mouth 2 (two) times daily. [name], FNP Taking Active   hydrALAZINE (APRESOLINE) 100 MG tablet [id number]  Take 1 tablet (100 mg total) by mouth 3 (three) times daily for 90 doses. Fritzi Mandes, MD  Expired 05/27/20 2359   labetalol (NORMODYNE) 100 MG tablet [id number] Yes Take 1 tablet (100 mg total) by mouth 2 (two) times  daily. Minus Breeding, MD Taking Active Self  LANTUS SOLOSTAR 100 UNIT/ML Solostar Pen [id number] Yes INJECT 65 UNITS SUBCUTANEOUSLY AT BEDTIME [name], MD Taking Active   liraglutide (VICTOZA) 18 MG/3ML SOPN [id number] Yes Inject 0.3 mLs (1.8 mg total) into the skin daily. [name], MD Taking Active   Misc. Devices MISC [id number] Yes Rollator with Seat Shower Chair  Dx- CHF; Duration-lifetime. Weight 241 lbs [name], MD Taking Active Self  pregabalin (LYRICA) 75 MG capsule [id number] Yes Take 1 capsule (75 mg total) by mouth 2 (two) times daily. [name], MD Taking Active            Assessment:   Goals Addressed              This Visit's Progress   .  " I have swelling" (pt-stated)          CARE PLAN ENTRY Medicaid Managed Care (see longitudinal plan of care for additional care plan information)  Current Barriers:  . Patient states leg swelling-went to see provider yesterday.  Nurse Case Manager Clinical Goal(s):  Marland Kitchen Over the next 30 days, patient will verbalize understanding of plan for decreased swelling. . Over the next 30 days, patient will work with provider to address needs related to swelling.  Interventions:  . Inter-disciplinary care team collaboration (see longitudinal plan of care) . Reviewed medications with patient. . Discussed plans with patient for ongoing care management follow up and provided patient with direct contact information for care management team  Plan:  . Patient will follow up with provider and fill all prescriptions . RNCM will follow up with patient within 14 days.   Initial goal documentation        Plan: RNCM will follow up with patient within 14 days.

## 2020-05-29 ENCOUNTER — Telehealth: Payer: Self-pay | Admitting: Family Medicine

## 2020-05-29 NOTE — Telephone Encounter (Signed)
Patient is calling because cardiology dx her with lymphedema yesterday. Patient is calling to request an order for compression hose for both legs. Please advise CB- [phone]

## 2020-05-29 NOTE — Telephone Encounter (Signed)
Call Karna Christmas [phone] -Compression Socks  The information above was listed on the AVS from the Cardiologist office yesterday. Patient needs to refer to the AVS and call the number for compression socks.

## 2020-05-30 NOTE — Telephone Encounter (Signed)
Patient was called and a VM was left informing patient of phone number to call to get her socks.

## 2020-06-02 ENCOUNTER — Other Ambulatory Visit: Payer: Self-pay

## 2020-06-03 ENCOUNTER — Encounter: Payer: Self-pay | Admitting: Family Medicine

## 2020-06-03 ENCOUNTER — Other Ambulatory Visit: Payer: Self-pay

## 2020-06-03 ENCOUNTER — Ambulatory Visit: Payer: Medicaid Other | Attending: Family Medicine | Admitting: Family Medicine

## 2020-06-03 VITALS — BP 150/86 | HR 86 | Temp 98.2°F | Ht 64.0 in | Wt 246.0 lb

## 2020-06-03 DIAGNOSIS — E11311 Type 2 diabetes mellitus with unspecified diabetic retinopathy with macular edema: Secondary | ICD-10-CM

## 2020-06-03 DIAGNOSIS — Z794 Long term (current) use of insulin: Secondary | ICD-10-CM

## 2020-06-03 DIAGNOSIS — Z23 Encounter for immunization: Secondary | ICD-10-CM | POA: Diagnosis not present

## 2020-06-03 DIAGNOSIS — N1832 Chronic kidney disease, stage 3b: Secondary | ICD-10-CM | POA: Diagnosis not present

## 2020-06-03 DIAGNOSIS — E1149 Type 2 diabetes mellitus with other diabetic neurological complication: Secondary | ICD-10-CM

## 2020-06-03 DIAGNOSIS — E1122 Type 2 diabetes mellitus with diabetic chronic kidney disease: Secondary | ICD-10-CM

## 2020-06-03 DIAGNOSIS — I110 Hypertensive heart disease with heart failure: Secondary | ICD-10-CM | POA: Diagnosis not present

## 2020-06-03 DIAGNOSIS — R600 Localized edema: Secondary | ICD-10-CM | POA: Diagnosis not present

## 2020-06-03 DIAGNOSIS — I5032 Chronic diastolic (congestive) heart failure: Secondary | ICD-10-CM

## 2020-06-03 LAB — GLUCOSE, POCT (MANUAL RESULT ENTRY): POC Glucose: 75 mg/dl (ref 70–99)

## 2020-06-03 LAB — POCT GLYCOSYLATED HEMOGLOBIN (HGB A1C): HbA1c, POC (controlled diabetic range): 6.3 % (ref 0.0–7.0)

## 2020-06-03 MED ORDER — LABETALOL HCL 300 MG PO TABS: 300.0000 mg | ORAL_TABLET | Freq: Two times a day (BID) | ORAL | 3 refills | Status: DC

## 2020-06-03 MED ORDER — LANTUS SOLOSTAR 100 UNIT/ML ~~LOC~~ SOPN: 60.0000 [IU] | PEN_INJECTOR | Freq: Every day | SUBCUTANEOUS | 6 refills | Status: DC

## 2020-06-03 MED ORDER — PREGABALIN 75 MG PO CAPS: 75.0000 mg | ORAL_CAPSULE | Freq: Two times a day (BID) | ORAL | 5 refills | Status: DC

## 2020-06-03 MED ORDER — VICTOZA 18 MG/3ML ~~LOC~~ SOPN: 1.8000 mg | PEN_INJECTOR | Freq: Every day | SUBCUTANEOUS | 6 refills | Status: DC

## 2020-06-03 MED ORDER — HYDRALAZINE HCL 100 MG PO TABS: 100.0000 mg | ORAL_TABLET | Freq: Three times a day (TID) | ORAL | 3 refills | Status: DC

## 2020-06-03 NOTE — Patient Instructions (Signed)
DASH Eating Plan DASH stands for "Dietary Approaches to Stop Hypertension." The DASH eating plan is a healthy eating plan that has been shown to reduce high blood pressure (hypertension). It may also reduce your risk for type 2 diabetes, heart disease, and stroke. The DASH eating plan may also help with weight loss. What are tips for following this plan?  General guidelines  Avoid eating more than 2,300 mg (milligrams) of salt (sodium) a day. If you have hypertension, you may need to reduce your sodium intake to 1,500 mg a day.  Limit alcohol intake to no more than 1 drink a day for nonpregnant women and 2 drinks a day for men. One drink equals 12 oz of beer, 5 oz of wine, or 1 oz of hard liquor.  Work with your health care provider to maintain a healthy body weight or to lose weight. Ask what an ideal weight is for you.  Get at least 30 minutes of exercise that causes your heart to beat faster (aerobic exercise) most days of the week. Activities may include walking, swimming, or biking.  Work with your health care provider or diet and nutrition specialist (dietitian) to adjust your eating plan to your individual calorie needs. Reading food labels   Check food labels for the amount of sodium per serving. Choose foods with less than 5 percent of the Daily Value of sodium. Generally, foods with less than 300 mg of sodium per serving fit into this eating plan.  To find whole grains, look for the word "whole" as the first word in the ingredient list. Shopping  Buy products labeled as "low-sodium" or "no salt added."  Buy fresh foods. Avoid canned foods and premade or frozen meals. Cooking  Avoid adding salt when cooking. Use salt-free seasonings or herbs instead of table salt or sea salt. Check with your health care provider or pharmacist before using salt substitutes.  Do not fry foods. Cook foods using healthy methods such as baking, boiling, grilling, and broiling instead.  Cook with  heart-healthy oils, such as olive, canola, soybean, or sunflower oil. Meal planning  Eat a balanced diet that includes: ? 5 or more servings of fruits and vegetables each day. At each meal, try to fill half of your plate with fruits and vegetables. ? Up to 6-8 servings of whole grains each day. ? Less than 6 oz of lean meat, poultry, or fish each day. A 3-oz serving of meat is about the same size as a deck of cards. One egg equals 1 oz. ? 2 servings of low-fat dairy each day. ? A serving of nuts, seeds, or beans 5 times each week. ? Heart-healthy fats. Healthy fats called Omega-3 fatty acids are found in foods such as flaxseeds and coldwater fish, like sardines, salmon, and mackerel.  Limit how much you eat of the following: ? Canned or prepackaged foods. ? Food that is high in trans fat, such as fried foods. ? Food that is high in saturated fat, such as fatty meat. ? Sweets, desserts, sugary drinks, and other foods with added sugar. ? Full-fat dairy products.  Do not salt foods before eating.  Try to eat at least 2 vegetarian meals each week.  Eat more home-cooked food and less restaurant, buffet, and fast food.  When eating at a restaurant, ask that your food be prepared with less salt or no salt, if possible. What foods are recommended? The items listed may not be a complete list. Talk with your dietitian about   what dietary choices are best for you. Grains Whole-grain or whole-wheat bread. Whole-grain or whole-wheat pasta. Brown rice. Oatmeal. Quinoa. Bulgur. Whole-grain and low-sodium cereals. Pita bread. Low-fat, low-sodium crackers. Whole-wheat flour tortillas. Vegetables Fresh or frozen vegetables (raw, steamed, roasted, or grilled). Low-sodium or reduced-sodium tomato and vegetable juice. Low-sodium or reduced-sodium tomato sauce and tomato paste. Low-sodium or reduced-sodium canned vegetables. Fruits All fresh, dried, or frozen fruit. Canned fruit in natural juice (without  added sugar). Meat and other protein foods Skinless chicken or turkey. Ground chicken or turkey. Pork with fat trimmed off. Fish and seafood. Egg whites. Dried beans, peas, or lentils. Unsalted nuts, nut butters, and seeds. Unsalted canned beans. Lean cuts of beef with fat trimmed off. Low-sodium, lean deli meat. Dairy Low-fat (1%) or fat-free (skim) milk. Fat-free, low-fat, or reduced-fat cheeses. Nonfat, low-sodium ricotta or cottage cheese. Low-fat or nonfat yogurt. Low-fat, low-sodium cheese. Fats and oils Soft margarine without trans fats. Vegetable oil. Low-fat, reduced-fat, or light mayonnaise and salad dressings (reduced-sodium). Canola, safflower, olive, soybean, and sunflower oils. Avocado. Seasoning and other foods Herbs. Spices. Seasoning mixes without salt. Unsalted popcorn and pretzels. Fat-free sweets. What foods are not recommended? The items listed may not be a complete list. Talk with your dietitian about what dietary choices are best for you. Grains Baked goods made with fat, such as croissants, muffins, or some breads. Dry pasta or rice meal packs. Vegetables Creamed or fried vegetables. Vegetables in a cheese sauce. Regular canned vegetables (not low-sodium or reduced-sodium). Regular canned tomato sauce and paste (not low-sodium or reduced-sodium). Regular tomato and vegetable juice (not low-sodium or reduced-sodium). Pickles. Olives. Fruits Canned fruit in a light or heavy syrup. Fried fruit. Fruit in cream or butter sauce. Meat and other protein foods Fatty cuts of meat. Ribs. Fried meat. Bacon. Sausage. Bologna and other processed lunch meats. Salami. Fatback. Hotdogs. Bratwurst. Salted nuts and seeds. Canned beans with added salt. Canned or smoked fish. Whole eggs or egg yolks. Chicken or turkey with skin. Dairy Whole or 2% milk, cream, and half-and-half. Whole or full-fat cream cheese. Whole-fat or sweetened yogurt. Full-fat cheese. Nondairy creamers. Whipped toppings.  Processed cheese and cheese spreads. Fats and oils Butter. Stick margarine. Lard. Shortening. Ghee. Bacon fat. Tropical oils, such as coconut, palm kernel, or palm oil. Seasoning and other foods Salted popcorn and pretzels. Onion salt, garlic salt, seasoned salt, table salt, and sea salt. Worcestershire sauce. Tartar sauce. Barbecue sauce. Teriyaki sauce. Soy sauce, including reduced-sodium. Steak sauce. Canned and packaged gravies. Fish sauce. Oyster sauce. Cocktail sauce. Horseradish that you find on the shelf. Ketchup. Mustard. Meat flavorings and tenderizers. Bouillon cubes. Hot sauce and Tabasco sauce. Premade or packaged marinades. Premade or packaged taco seasonings. Relishes. Regular salad dressings. Where to find more information:  National Heart, Lung, and Blood Institute: www.nhlbi.nih.gov  American Heart Association: www.heart.org Summary  The DASH eating plan is a healthy eating plan that has been shown to reduce high blood pressure (hypertension). It may also reduce your risk for type 2 diabetes, heart disease, and stroke.  With the DASH eating plan, you should limit salt (sodium) intake to 2,300 mg a day. If you have hypertension, you may need to reduce your sodium intake to 1,500 mg a day.  When on the DASH eating plan, aim to eat more fresh fruits and vegetables, whole grains, lean proteins, low-fat dairy, and heart-healthy fats.  Work with your health care provider or diet and nutrition specialist (dietitian) to adjust your eating plan to your   individual calorie needs. This information is not intended to replace advice given to you by your health care provider. Make sure you discuss any questions you have with your health care provider. Document Revised: 06/17/2017 Document Reviewed: 06/28/2016 Elsevier Patient Education  2020 Elsevier Inc.  

## 2020-06-03 NOTE — Progress Notes (Signed)
Subjective:  Patient ID: [name] [name], female    DOB: [birth year]  Age: 59 y.o. MRN: [id number]  CC: Diabetes   HPI [name] [name] is a 59 year old female with a history of type 2 diabetes mellitus (A1c6.3), diabetic neuropathy hypertension, diabetic retinopathy, hyperlipidemia, hepatitis C (completed treatment with harvoni), OSA, diastolic CHF (EF 55 to 66%), stage III CKDwho presents today for a follow up visit.  She had a cardiology visit on 05/28/2020 at which time she was thought to have lymphedema with recommendations to refer her to lymphedema specialist. She informs me today she does have bilateral lower extremity edema which she states is better today.  Of note she is on amlodipine. Her blood pressure is elevated and she endorses compliance with her medications. With regards to her diabetes mellitus she is compliant with her medications and denies hypoglycemia.  Neuropathy is controlled on gabapentin. For her chronic kidney disease she is on iron replacement therapy but informs me that after her last scheduled injection which she missed she has not been able to schedule additional injections.  Her nephrologist is with [facility] and she has been advised to contact them regarding this.  She has no additional concerns today.  Past Medical History:  Diagnosis Date  . Anemia   . CHF (congestive heart failure) ([place])   . Chronic hepatitis C without hepatic coma ([place]) 11/09/2016  . Diabetes mellitus   . Fibroids   . 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 Hypertension   . MRSA (methicillin resistant Staphylococcus aureus)   . Trichomonas   . VAGINITIS, BACTERIAL, RECURRENT 08/15/2007   Qualifier: Diagnosis of  By: Radene Ou MD, [place]      Past Surgical History:  Procedure Laterality Date  . BREAST BIOPSY Left 2018  . CESAREAN SECTION     breech    Family History  Problem Relation Age of Onset  . Colon cancer Mother   .  Liver disease Sister   . Other Neg Hx   . Breast cancer Neg Hx   . Esophageal cancer Neg Hx   . Rectal cancer Neg Hx     Allergies  Allergen Reactions  . Lisinopril Swelling    Outpatient Medications Prior to Visit  Medication Sig Dispense Refill  . amLODipine (NORVASC) 10 MG tablet Take 1 tablet (10 mg total) by mouth daily. 90 tablet 3  . atorvastatin (LIPITOR) 40 MG tablet Take 1 tablet (40 mg total) by mouth daily. 90 tablet 3  . blood glucose meter kit and supplies KIT Dispense based on patient and insurance preference. Use up to four times daily as directed. (FOR ICD-9 250.00, 250.01). 1 each 2  . ferrous sulfate 325 (65 FE) MG tablet Take 1 tablet (325 mg total) by mouth 2 (two) times daily with a meal. 60 tablet 3  . furosemide (LASIX) 40 MG tablet Take 1 tablet (40 mg total) by mouth 2 (two) times daily. 180 tablet 3  . labetalol (NORMODYNE) 100 MG tablet Take 1 tablet (100 mg total) by mouth 2 (two) times daily. 180 tablet 3  . LANTUS SOLOSTAR 100 UNIT/ML Solostar Pen INJECT 65 UNITS SUBCUTANEOUSLY AT BEDTIME 15 mL 4  . liraglutide (VICTOZA) 18 MG/3ML SOPN Inject 0.3 mLs (1.8 mg total) into the skin daily. 3 pen 6  . Misc. Devices MISC Rollator with Paramedic  Dx- CHF; Duration-lifetime. Weight 241 lbs 1 each 0  . pregabalin (LYRICA) 75 MG capsule  Take 1 capsule (75 mg total) by mouth 2 (two) times daily. 60 capsule 3  . aspirin EC 81 MG EC tablet Take 1 tablet (81 mg total) by mouth daily. (Patient not taking: Reported on 05/28/2020) 30 tablet 1  . hydrALAZINE (APRESOLINE) 100 MG tablet Take 1 tablet (100 mg total) by mouth 3 (three) times daily for 90 doses. 90 tablet 1   No facility-administered medications prior to visit.     ROS Review of Systems  Constitutional: Negative for activity change, appetite change and fatigue.  HENT: Negative for congestion, sinus pressure and sore throat.   Eyes: Negative for visual disturbance.  Respiratory: Negative for  cough, chest tightness, shortness of breath and wheezing.   Cardiovascular: Negative for chest pain and palpitations.  Gastrointestinal: Negative for abdominal distention, abdominal pain and constipation.  Endocrine: Negative for polydipsia.  Genitourinary: Negative for dysuria and frequency.  Musculoskeletal: Negative for arthralgias and back pain.  Skin: Negative for rash.  Neurological: Negative for tremors, light-headedness and numbness.  Hematological: Does not bruise/bleed easily.  Psychiatric/Behavioral: Negative for agitation and behavioral problems.    Objective:  BP (!) 150/86   Pulse 86   Temp 98.2 F (36.8 C) (Oral)   Ht 5' 4" (1.626 m)   Wt 246 lb (111.6 kg)   LMP 04/01/2010   SpO2 97%   BMI 42.23 kg/m   BP/Weight 06/03/2020 05/28/2020 34/03/1790  Systolic BP 505 697 948  Diastolic BP 86 96 86  Wt. (Lbs) 246 248.5 251  BMI 42.23 42.65 43.08  Some encounter information is confidential and restricted. Go to Review Flowsheets activity to see all data.      Physical Exam Constitutional:      Appearance: She is well-developed.  Neck:     Vascular: No JVD.  Cardiovascular:     Rate and Rhythm: Normal rate.     Heart sounds: Murmur heard.   Pulmonary:     Effort: Pulmonary effort is normal.     Breath sounds: Normal breath sounds. No wheezing or rales.  Chest:     Chest wall: No tenderness.  Abdominal:     General: Bowel sounds are normal. There is no distension.     Palpations: Abdomen is soft. There is no mass.     Tenderness: There is no abdominal tenderness.  Musculoskeletal:        General: Normal range of motion.     Right lower leg: Edema present.     Left lower leg: Edema present.  Neurological:     Mental Status: She is alert and oriented to person, place, and time.  Psychiatric:        Mood and Affect: Mood normal.     CMP Latest Ref Rng & Units 05/27/2020 11/08/2019 11/07/2019  Glucose 70 - 99 mg/dL 91 274(H) 241(H)  BUN 6 - 20 mg/dL 26(H)  45(H) 45(H)  Creatinine 0.44 - 1.00 mg/dL 1.41(H) 1.68(H) 1.65(H)  Sodium 135 - 145 mmol/L 141 138 140  Potassium 3.5 - 5.1 mmol/L 3.7 4.0 4.0  Chloride 98 - 111 mmol/L 110 105 108  CO2 22 - 32 mmol/L _0 Calcium 8.9 - 10.3 mg/dL 8.5(L) 8.7(L) 8.6(L)  Total Protein 6.5 - 8.1 g/dL - - -  Total Bilirubin 0.3 - 1.2 mg/dL - - -  Alkaline Phos 38 - 126 U/L - - -  AST 15 - 41 U/L - - -  ALT 0 - 44 U/L - - -    Lipid  Panel     Component Value Date/Time   CHOL 147 01/29/2019 1118   TRIG 102 01/29/2019 1118   HDL 59 01/29/2019 1118   CHOLHDL 2.5 01/29/2019 1118   CHOLHDL 3 06/18/2014 0951   VLDL 19.6 06/18/2014 0951   LDLCALC 68 01/29/2019 1118    CBC    Component Value Date/Time   WBC 4.7 11/05/2019 1851   RBC 3.10 (L) 11/05/2019 1851   HGB 10.4 (L) 03/31/2020 1100   HGB 9.8 (L) 07/10/2019 1544   HCT 27.1 (L) 11/05/2019 1851   HCT 30.9 (L) 07/10/2019 1544   PLT 161 11/05/2019 1851   PLT 171 07/10/2019 1544   MCV 87.4 11/05/2019 1851   MCV 86 07/10/2019 1544   MCH 28.1 11/05/2019 1851   MCHC 32.1 11/05/2019 1851   RDW 13.7 11/05/2019 1851   RDW 12.7 07/10/2019 1544   LYMPHSABS 1.3 07/10/2019 1544   MONOABS 0.4 06/05/2019 0442   EOSABS 0.0 07/10/2019 1544   BASOSABS 0.0 07/10/2019 1544   Lab Results  Component Value Date   HGBA1C 6.3 06/03/2020     Assessment & Plan:  1. Type 2 diabetes mellitus with stage 3b chronic kidney disease, with long-term current use of insulin (HCC) Diabetic nephropathy Advised to call her nephrologist office to secure an appointment She will need to get back on erythropoietin therapy - POCT glucose (manual entry) - POCT glycosylated hemoglobin (Hb A1C) - Microalbumin / creatinine urine ratio  2. Type 2 diabetes mellitus with other neurologic complication, with long-term current use of insulin (HCC) Controlled with A1c of 6.3 Reduced Lantus dose to prevent hypoglycemia - insulin glargine (LANTUS SOLOSTAR) 100 UNIT/ML Solostar  Pen; Inject 60 Units into the skin at bedtime.  Dispense: 15 mL; Refill: 6 - pregabalin (LYRICA) 75 MG capsule; Take 1 capsule (75 mg total) by mouth 2 (two) times daily.  Dispense: 60 capsule; Refill: 5 - liraglutide (VICTOZA) 18 MG/3ML SOPN; Inject 1.8 mg into the skin daily.  Dispense: 9 mL; Refill: 6 - Ambulatory referral to Podiatry  3. 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  Dispense: 15 mL; Refill: 6  4. Pedal edema Slight edema on exam Likely Amlodipine related Underlying CKD  also contributory If persistent after discontinuation of Amlodipine consider referral to Lymphedema specialist   5. Hypertensive heart disease with chronic diastolic congestive heart failure (HCC) Uncontrolled Increased dose of Labetalol Counseled on blood pressure goal of less than 130/80, low-sodium, DASH diet, medication compliance, 150 minutes of moderate intensity exercise per week. Discussed medication compliance, adverse effects. - labetalol (NORMODYNE) 300 MG tablet; Take 1 tablet (300 mg total) by mouth 2 (two) times daily.  Dispense: 60 tablet; Refill: 3 - hydrALAZINE (APRESOLINE) 100 MG tablet; Take 1 tablet (100 mg total) by mouth 3 (three) times daily for 90 doses.  Dispense: 90 tablet; Refill: 3  6. Need for immunization against influenza - Flu Vaccine QUAD 36+ mos IM    No orders of the defined types were placed in this encounter.   Follow-up: Return in about 1 month (around 07/03/2020) for PAP smear.       [name], MD, FAAFP. [facility] and [place], [place]   06/03/2020, 1:43 PM

## 2020-06-04 LAB — MICROALBUMIN / CREATININE URINE RATIO
Creatinine, Urine: 61.7 mg/dL
Microalb/Creat Ratio: 2271 mg/g creat — ABNORMAL HIGH (ref 0–29)
Microalbumin, Urine: 1401.5 ug/mL

## 2020-06-06 ENCOUNTER — Telehealth: Payer: Self-pay

## 2020-06-06 NOTE — Telephone Encounter (Signed)
Patient was called and a voicemail was left informing patient to return phone call for lab results. 

## 2020-06-06 NOTE — Telephone Encounter (Signed)
-----   Message from [name], MD sent at 06/04/2020  5:36 PM EST ----- Microalbumin reveals protein in urine likely from her chronic kidney disease. Due to Lisinopril allergy we cannot place her on it even though it is beneficial with respect to microalbuminuria. We will continue her current management and follow up with Nephrology.

## 2020-06-09 ENCOUNTER — Telehealth: Payer: Self-pay

## 2020-06-09 NOTE — Telephone Encounter (Signed)
Patient was called and a voicemail was left informing patient to return phone call for lab results.  A letter has been mailed to pt informing her to contact office for her lab results.

## 2020-06-09 NOTE — Telephone Encounter (Signed)
-----   Message from [name], MD sent at 06/04/2020  5:36 PM EST ----- Microalbumin reveals protein in urine likely from her chronic kidney disease. Due to Lisinopril allergy we cannot place her on it even though it is beneficial with respect to microalbuminuria. We will continue her current management and follow up with Nephrology.

## 2020-06-10 ENCOUNTER — Other Ambulatory Visit: Payer: Self-pay | Admitting: Obstetrics and Gynecology

## 2020-06-10 ENCOUNTER — Other Ambulatory Visit: Payer: Self-pay

## 2020-06-10 NOTE — Patient Instructions (Signed)
Hi Ms. Demo you for speaking with me today.  Ms. Hastings was given information about Medicaid Managed Care team care coordination services as a part of their [place] Medicaid benefit. Brinleigh Tew verbally consented to engagement with the [facility] Managed Care team.   For questions related to your [facility], please call: [phone] or visit the homepage here: [email or web address]  If you would like to schedule transportation through your [facility], please call the following number at least 2 days in advance of your appointment: [phone]  Goals Addressed              This Visit's Progress   .  " I have swelling" (pt-stated)        16/21.  CARE PLAN ENTRY Medicaid Managed Care (see longitudinal plan of care for additional care plan information)  Current Barriers:  . Patient states leg swelling-went to see provider yesterday.  Nurse Case Manager Clinical Goal(s):  Marland Kitchen Over the next 30 days, patient will verbalize understanding of plan for decreased swelling.  Update 06/10/20:  patient states swelling has decreased with compression hose/stockings . Over the next 30 days, patient will work with provider to address needs related to swelling.  Update 06/10/20:  patient was seen by her PCP, Dr. Margarita Rana, 06/03/20.  Interventions:  . Inter-disciplinary care team collaboration (see longitudinal plan of care) . Reviewed medications with patient. . Discussed plans with patient for ongoing care management follow up and provided patient with direct contact information for care management team  Plan:  . Patient will follow up with provider and fill all prescriptions . RNCM will follow up with patient within 30 days.   Please see past updates related to this goal by clicking on the "Past Updates" button in the selected goal       Patient verbalizes  understanding of instructions provided today.   RN Care Manager will follow up with patient within 30 days. The patient has been provided with contact information for the Managed Medicaid care management team and has been advised to call with any health related questions or concerns.   [name] RN, BSN [place]  Triad Curator - Managed Medicaid High Risk [phone].

## 2020-06-10 NOTE — Patient Outreach (Signed)
Care Coordination - Case Manager  06/10/2020  [name] [name] 1961-06-16 [id number]  Subjective:  [name] [name] is an 58 y.o. year old female who is a primary patient of [name], MD.  [name] was given information about Medicaid Managed Care team care coordination services today. Tamala Ser agreed to services and verbal consent obtained  Review of patient status, laboratory and other test data was performed as part of evaluation for provision of services.  SDOH: SDOH Screenings   Alcohol Screen:   . Last Alcohol Screening Score (AUDIT): Not on file  Depression (PHQ2-9): Medium Risk  . PHQ-2 Score: 6  Financial Resource Strain:   . Difficulty of Paying Living Expenses: Not on file  Food Insecurity:   . Worried About Charity fundraiser in the Last Year: Not on file  . Ran Out of Food in the Last Year: Not on file  Housing:   . Last Housing Risk Score: Not on file  Physical Activity:   . Days of Exercise per Week: Not on file  . Minutes of Exercise per Session: Not on file  Social Connections:   . Frequency of Communication with Friends and Family: Not on file  . Frequency of Social Gatherings with Friends and Family: Not on file  . Attends Religious Services: Not on file  . Active Member of Clubs or Organizations: Not on file  . Attends Archivist Meetings: Not on file  . Marital Status: Not on file  Stress:   . Feeling of Stress : Not on file  Tobacco Use: Low Risk   . Smoking Tobacco Use: Never Smoker  . Smokeless Tobacco Use: Never Used  Transportation Needs:   . Film/video editor (Medical): Not on file  . Lack of Transportation (Non-Medical): Not on file     Objective:    Allergies  Allergen Reactions  . Lisinopril Swelling    Medications:    Medications Reviewed Today    Reviewed by Gayla Medicus, RN (Registered Nurse) on 06/10/20 at 1441  Med List Status: <None>  Medication Order Taking? Sig Documenting Provider  Last Dose Status Informant  aspirin EC 81 MG EC tablet [id number] No Take 1 tablet (81 mg total) by mouth daily.  Patient not taking: Reported on 05/28/2020   Fritzi Mandes, MD Not Taking Active   atorvastatin (LIPITOR) 40 MG tablet [id number] No Take 1 tablet (40 mg total) by mouth daily. [name], FNP Taking Active   blood glucose meter kit and supplies KIT [id number] No Dispense based on patient and insurance preference. Use up to four times daily as directed. (FOR ICD-9 250.00, 250.01). Dustin Flock, MD Taking Active Self  ferrous sulfate 325 (65 FE) MG tablet [id number] No Take 1 tablet (325 mg total) by mouth 2 (two) times daily with a meal. Fritzi Mandes, MD Taking Active   furosemide (LASIX) 40 MG tablet [id number] No Take 1 tablet (40 mg total) by mouth 2 (two) times daily. [name], FNP Taking Active   hydrALAZINE (APRESOLINE) 100 MG tablet [id number]  Take 1 tablet (100 mg total) by mouth 3 (three) times daily for 90 doses. [name], MD  Active   insulin glargine (LANTUS SOLOSTAR) 100 UNIT/ML Solostar Pen [id number]  Inject 60 Units into the skin at bedtime. [name], MD  Active   labetalol (NORMODYNE) 300 MG tablet [id number]  Take 1 tablet (300 mg total) by mouth 2 (two) times daily. [name], MD  Active   liraglutide (  VICTOZA) 18 MG/3ML SOPN [id number]  Inject 1.8 mg into the skin daily. [name], MD  Active   Misc. Devices MISC [id number] No Rollator with Seat Shower Chair  Dx- CHF; Duration-lifetime. Weight 241 lbs [name], MD Taking Active Self  pregabalin (LYRICA) 75 MG capsule [id number]  Take 1 capsule (75 mg total) by mouth 2 (two) times daily. [name], MD  Active           Assessment:   Goals Addressed              This Visit's Progress   .  " I have swelling" (pt-stated)        16/21.  CARE PLAN ENTRY Medicaid Managed Care (see longitudinal plan of care for additional care plan information)  Current Barriers:   . Patient states leg swelling-went to see provider yesterday.  Nurse Case Manager Clinical Goal(s):  Marland Kitchen Over the next 30 days, patient will verbalize understanding of plan for decreased swelling.  Update 06/10/20:  patient states swelling has decreased with compression hose/stockings . Over the next 30 days, patient will work with provider to address needs related to swelling.  Update 06/10/20:  patient was seen by her PCP, Dr. Margarita Rana, 06/03/20.  Interventions:  . Inter-disciplinary care team collaboration (see longitudinal plan of care) . Reviewed medications with patient. . Discussed plans with patient for ongoing care management follow up and provided patient with direct contact information for care management team  Plan:  . Patient will follow up with provider and fill all prescriptions . RNCM will follow up with patient within 30 days.   Please see past updates related to this goal by clicking on the "Past Updates" button in the selected goal        Plan: RNCM will follow up with patient within 30 days.

## 2020-06-18 DIAGNOSIS — I5032 Chronic diastolic (congestive) heart failure: Secondary | ICD-10-CM | POA: Diagnosis not present

## 2020-06-26 ENCOUNTER — Other Ambulatory Visit: Payer: Self-pay

## 2020-06-26 DIAGNOSIS — E559 Vitamin D deficiency, unspecified: Secondary | ICD-10-CM | POA: Diagnosis not present

## 2020-06-26 DIAGNOSIS — R768 Other specified abnormal immunological findings in serum: Secondary | ICD-10-CM | POA: Diagnosis not present

## 2020-06-26 DIAGNOSIS — I5032 Chronic diastolic (congestive) heart failure: Secondary | ICD-10-CM | POA: Diagnosis not present

## 2020-06-26 DIAGNOSIS — R809 Proteinuria, unspecified: Secondary | ICD-10-CM | POA: Diagnosis not present

## 2020-06-26 DIAGNOSIS — D631 Anemia in chronic kidney disease: Secondary | ICD-10-CM | POA: Diagnosis not present

## 2020-06-26 DIAGNOSIS — N1832 Chronic kidney disease, stage 3b: Secondary | ICD-10-CM | POA: Diagnosis not present

## 2020-06-26 DIAGNOSIS — E1122 Type 2 diabetes mellitus with diabetic chronic kidney disease: Secondary | ICD-10-CM | POA: Diagnosis not present

## 2020-06-26 DIAGNOSIS — I129 Hypertensive chronic kidney disease with stage 1 through stage 4 chronic kidney disease, or unspecified chronic kidney disease: Secondary | ICD-10-CM | POA: Diagnosis not present

## 2020-06-26 DIAGNOSIS — N2581 Secondary hyperparathyroidism of renal origin: Secondary | ICD-10-CM | POA: Diagnosis not present

## 2020-06-26 DIAGNOSIS — E1129 Type 2 diabetes mellitus with other diabetic kidney complication: Secondary | ICD-10-CM | POA: Diagnosis not present

## 2020-06-27 ENCOUNTER — Ambulatory Visit: Payer: Medicaid Other | Admitting: Podiatry

## 2020-06-30 ENCOUNTER — Other Ambulatory Visit: Payer: Self-pay

## 2020-06-30 NOTE — Patient Instructions (Addendum)
Visit Information  Ms. Pappalardo was given information about Medicaid Managed Care team care coordination services as a part of their [place] Medicaid benefit. Anyelina Claycomb verbally consented to engagement with the [facility] Managed Care team.   For questions related to your [facility], please call: [phone] or visit the homepage here: [email or web address]  If you would like to schedule transportation through your [facility], please call the following number at least 2 days in advance of your appointment: [phone]  Goals Addressed            This Visit's Progress   . Medication Management        Medicaid Managed Care CARE PLAN ENTRY (see longitudinal plan of care for additional care plan information)  Current Barriers:  . Social, financial, community barriers:  o Poor reading comprehension per patient . Patient with complex health conditions multiple comorbidities including DM . Self-manages medications. Does not use a pill box or other adherence strategies . [place] - [address] [place] ([place]) [place] [address] Phone: [phone] Fax: [phone]     Pharmacist Clinical Goal(s):  Marland Kitchen Over the next 30days, patient will work with PharmD and provider towards optimized medication management  Interventions: . Comprehensive medication review performed; medication list updated in electronic medical record . Inter-disciplinary care team collaboration (see longitudinal plan of care)  Cholesterol Lab Results  Component Value Date   CHOL 147 01/29/2019   CHOL 158 08/16/2017   CHOL 172 11/08/2016   Lab Results  Component Value Date   HDL 59 01/29/2019   HDL 65 08/16/2017   HDL 80 11/08/2016   Lab Results  Component Value Date   LDLCALC 68 01/29/2019   LDLCALC  74 08/16/2017   LDLCALC 72 11/08/2016   Lab Results  Component Value Date   TRIG 102 01/29/2019   TRIG 93 08/16/2017   TRIG 100 11/08/2016   Lab Results  Component Value Date   CHOLHDL 2.5 01/29/2019   CHOLHDL 2.4 08/16/2017   CHOLHDL 2.2 11/08/2016   No results found for: LDLDIRECT  Atorvastatin 40mg  Plan: At goal,  patient stable/ symptoms controlled   Edema Furosemide -Wears stockings, states they hurt when she wears. 3/7 days she wears them Plan: At goal,  patient stable/ symptoms controlled    DM Lab Results  Component Value Date   HGBA1C 6.3 06/03/2020   HGBA1C 6.8 01/30/2020   HGBA1C 10.0 (H) 08/18/2019   Lab Results  Component Value Date   MICROALBUR 4.17 (H) 06/30/2010   LDLCALC 68 01/29/2019   CREATININE 1.41 (H) 05/27/2020    Lab Results  Component Value Date   NA 141 05/27/2020   K 3.7 05/27/2020   CREATININE 1.41 (H) 05/27/2020   GFRNONAA 43 (L) 05/27/2020   GFRAA 38 (L) 11/08/2019   GLUCOSE 91 05/27/2020    ASA 81mg  Lantus 60units QD (Patient doing 65) Liraglutide 18mg  (Victoza) -sugars: 90's per patient Plan: At goal,  patient stable/ symptoms controlled   Cardio BP Readings from Last 3 Encounters:  06/03/20 (!) 150/86  05/28/20 (!) 161/96  05/27/20 (!) 158/86   Hydralazine 100mg  TID Labetalol 300mg  BID Plan: BP not at goal but could be nervous. Patient will get BP monitor in next month to get more accurate readings  Pain 06/30/20: No meds, 8-9/10 With meds, 0/10 Feet/Hands Nerve pain Pregabalin 75mg  BID Plan: At goal,  patient stable/  symptoms controlled   Patient Self Care Activities:  . Patient will take medications as prescribed . Patient will focus on improved adherence immediately   Initial goal documentation  [place] Constable, Pharm.D., Managed Medicaid Pharmacist - [phone]         Please see education materials related to HTN provided as print materials.   Patient verbalizes understanding of  instructions provided today.   The Managed Medicaid care management team will reach out to the patient again over the next 30 days.   Lane Hacker, [facility]  Heart Failure, Self Care Heart failure is a serious condition. This sheet explains things you need to do to take care of yourself at home. To help you stay as healthy as possible, you may be asked to change your diet, take certain medicines, and make other changes in your life. Your doctor may also give you more specific instructions. If you have problems or questions, call your doctor. What are the risks? Having heart failure makes it more likely for you to have some problems. These problems can get worse if you do not take good care of yourself. Problems may include:  Blood clotting problems. This may cause a stroke.  Damage to the kidneys, liver, or lungs.  Abnormal heart rhythms. Supplies needed:  Scale for weighing yourself.  Blood pressure monitor.  Notebook.  Medicines. How to care for yourself when you have heart failure Medicines Take over-the-counter and prescription medicines only as told by your doctor. Take your medicines every day.  Do not stop taking your medicine unless your doctor tells you to do so.  Do not skip any medicines.  Get your prescriptions refilled before you run out of medicine. This is important. Eating and drinking   Eat heart-healthy foods. Talk with a diet specialist (dietitian) to create an eating plan.  Choose foods that: ? Have no trans fat. ? Are low in saturated fat and cholesterol.  Choose healthy foods, such as: ? Fresh or frozen fruits and vegetables. ? Fish. ? Low-fat (lean) meats. ? Legumes, such as beans, peas, and lentils. ? Fat-free or low-fat dairy products. ? Whole-grain foods. ? High-fiber foods.  Limit salt (sodium) if told by your doctor. Ask your diet specialist to tell you which seasonings are healthy for your heart.  Cook in healthy ways instead of  frying. Healthy ways of cooking include roasting, grilling, broiling, baking, poaching, steaming, and stir-frying.  Limit how much fluid you drink, if told by your doctor. Alcohol use  Do not drink alcohol if: ? Your doctor tells you not to drink. ? Your heart was damaged by alcohol, or you have very bad heart failure. ? You are pregnant, may be pregnant, or are planning to become pregnant.  If you drink alcohol: ? Limit how much you use to:  0-1 drink a day for women.  0-2 drinks a day for men. ? Be aware of how much alcohol is in your drink. In the [place] one drink equals one 12 oz bottle of beer (355 mL), one 5 oz glass of wine (148 mL), or one 1 oz glass of hard liquor (44 mL). Lifestyle   Do not use any products that contain nicotine or tobacco, such as cigarettes, e-cigarettes, and chewing tobacco. If you need help quitting, ask your doctor. ? Do not use nicotine gum or patches before talking to your doctor.  Do not use illegal drugs.  Lose weight if told by your doctor.  Do physical activity if told  by your doctor. Talk to your doctor before you begin an exercise if: ? You are an older adult. ? You have very bad heart failure.  Learn to manage stress. If you need help, ask your doctor.  Get rehab (rehabilitation) to help you stay independent and to help with your quality of life.  Plan time to rest when you get tired. Check weight and blood pressure   Weigh yourself every day. This will help you to know if fluid is building up in your body. ? Weigh yourself every morning after you pee (urinate) and before you eat breakfast. ? Wear the same amount of clothing each time. ? Write down your daily weight. Give your record to your doctor.  Check and write down your blood pressure as told by your doctor.  Check your pulse as told by your doctor. Dealing with very hot and very cold weather  If it is very hot: ? Avoid activities that take a lot of energy. ? Use air  conditioning or fans, or find a cooler place. ? Avoid caffeine and alcohol. ? Wear clothing that is loose-fitting, lightweight, and light-colored.  If it is very cold: ? Avoid activities that take a lot of energy. ? Layer your clothes. ? Wear mittens or gloves, a hat, and a scarf when you go outside. ? Avoid alcohol. Follow these instructions at home:  Stay up to date with shots (vaccines). Get pneumococcal and flu (influenza) shots.  Keep all follow-up visits as told by your doctor. This is important. Contact a doctor if:  You gain weight quickly.  You have increasing shortness of breath.  You cannot do your normal activities.  You get tired easily.  You cough a lot.  You don't feel like eating or feel like you may vomit (nauseous).  You become puffy (swell) in your hands, feet, ankles, or belly (abdomen).  You cannot sleep well because it is hard to breathe.  You feel like your heart is beating fast (palpitations).  You get dizzy when you stand up. Get help right away if:  You have trouble breathing.  You or someone else notices a change in your behavior, such as having trouble staying awake.  You have chest pain or discomfort.  You pass out (faint). These symptoms may be an emergency. Do not wait to see if the symptoms will go away. Get medical help right away. Call your local emergency services (911 in the [place] Do not drive yourself to the hospital. Summary  Heart failure is a serious condition. To care for yourself, you may have to change your diet, take medicines, and make other lifestyle changes.  Take your medicines every day. Do not stop taking them unless your doctor tells you to do so.  Eat heart-healthy foods, such as fresh or frozen fruits and vegetables, fish, lean meats, legumes, fat-free or low-fat dairy products, and whole-grain or high-fiber foods.  Ask your doctor if you can drink alcohol. You may have to stop alcohol use if you have very bad  heart failure.  Contact your doctor if you gain weight quickly or feel that your heart is beating too fast. Get help right away if you pass out, or have chest pain or trouble breathing. This information is not intended to replace advice given to you by your health care provider. Make sure you discuss any questions you have with your health care provider. Document Revised: 10/17/2018 Document Reviewed: 10/18/2018 Elsevier Patient Education  [place].

## 2020-07-04 ENCOUNTER — Other Ambulatory Visit: Payer: Medicaid Other

## 2020-07-04 DIAGNOSIS — Z20822 Contact with and (suspected) exposure to covid-19: Secondary | ICD-10-CM

## 2020-07-07 ENCOUNTER — Ambulatory Visit: Payer: Medicaid Other | Admitting: Podiatry

## 2020-07-07 ENCOUNTER — Other Ambulatory Visit: Payer: Self-pay | Admitting: Obstetrics and Gynecology

## 2020-07-07 ENCOUNTER — Telehealth: Payer: Self-pay | Admitting: Family Medicine

## 2020-07-07 NOTE — Patient Instructions (Signed)
Hi [name] [name] we missed you today  - as a part of your Medicaid benefit, you are eligible for care management and care coordination services at no cost or copay. I was unable to reach you by phone today but would be happy to help you with your health related needs. Please feel free to call me at [phone].   A member of the Managed Medicaid care management team will reach out to you again over the next 7 days.   [name] RN, BSN [place]  Triad Curator - Managed Medicaid High Risk [phone].

## 2020-07-07 NOTE — Telephone Encounter (Signed)
Copied from [place] [phone]. Topic: General - Inquiry >> Jul 07, 2020  1:00 PM Greggory Keen D wrote: Reason for CRM: Pt clled asking if the office can call her back regarding the test she had done last week  [phone]

## 2020-07-07 NOTE — Patient Outreach (Addendum)
Care Coordination  07/07/2020  [name] 1961-05-01 [id number]    Medicaid Managed Care   Unsuccessful Outreach Note  07/07/2020 Name: [name] [name] MRN: [id number] DOB: 1961-01-18  Referred by: [name], MD Reason for referral : High Risk Managed Medicaid (Unsuccessful telephone outreach//)   An unsuccessful telephone outreach was attempted today. The patient was referred to the case management team for assistance with care management and care coordination.   Follow Up Plan: A member of the Managed Medicaid team will follow up to reschedule appointment within the next 7 days.  [name] RN, BSN [place]   Triad Curator - Managed Medicaid High Risk [phone]

## 2020-07-08 LAB — NOVEL CORONAVIRUS, NAA

## 2020-07-08 NOTE — Telephone Encounter (Signed)
Patient returned call and told patient that she will need to redo her test. Patient understood.

## 2020-07-08 NOTE — Telephone Encounter (Signed)
Pt was called and a voicemail was left informing her to return phone call.  When pt returns phone call please inform pt that she needs to redo test due to recent test being non conclusive.

## 2020-07-21 ENCOUNTER — Telehealth: Payer: Self-pay | Admitting: Family Medicine

## 2020-07-21 NOTE — Telephone Encounter (Signed)
Attempted to reach [name] [name] to get her rescheduled with the HR MM RN Case Manager since she missed her appt with her on 07/07/20. I left my name and number for her to call me back. I will reach out again in 7-14 days if I have not heard back from her.

## 2020-07-22 ENCOUNTER — Other Ambulatory Visit: Payer: Self-pay

## 2020-07-22 NOTE — Patient Instructions (Signed)
Visit Information  Ms. Arenson was given information about Medicaid Managed Care team care coordination services as a part of their [facility] Community Plan Medicaid benefit. Ilayda Toda verbally consented to engagement with the [facility] Managed Care team.   For questions related to your [facility], please call: [phone] or visit the homepage here: [email or web address]  If you would like to schedule transportation through your [facility], please call the following number at least 2 days in advance of your appointment: [phone]  Goals Addressed   None    Patient Care Plan: General Plan of Care (Adult)    Problem Identified: Therapeutic Alliance (General Plan of Care)     Goal: Therapeutic Alliance Established   Note:   Evidence-based guidance:  Avoid value judgments; convey acceptance.  Encourage collaboration with the treatment team.  Establish rapport; develop trust relationship.  Sports coach.  Provide emotional support; encourage patient to share feelings of anger, fear and anxiety.  Promote self-reliance and autonomy based on age and ability; discourage overprotection.  Use empathy and nonjudgmental, participatory manner.   Notes:   Task: Develop Relationship to Effect Behavior Change   Note:   Care Management Activities:        Notes:   Problem Identified: Health Promotion or Disease Self-Management (General Plan of Care)     Goal: Self-Management Plan Developed   Note:   Evidence-based guidance:  Review biopsychosocial determinants of health screens.  Determine level of modifiable health risk.  Assess level of patient activation, level of readiness, importance and confidence to make changes.  Evoke change talk using open-ended questions, pros and cons, as well as looking forward.  Identify areas where behavior change may lead to improved  health.  Partner with patient to develop a robust self-management plan that includes lifestyle factors, such as weight loss, exercise and healthy nutrition, as well as goals specific to disease risks.  Support patient and family/caregiver active participation in decision-making and self-management plan.  Implement additional goals and interventions based on identified risk factors to reduce health risk.  Facilitate advance care planning.  Review need for preventive screening based on age, sex, family history and health history.   Notes:   Task: Mutually Develop and Foster Achievement of Patient Goals   Note:   Care Management Activities:        Notes:   Problem Identified: Coping Skills (General Plan of Care)     Goal: Coping Skills Enhanced   Note:   Evidence-based guidance:  Acknowledge, normalize and validate difficulty of making life-long lifestyle changes.  Identify current effective and ineffective coping strategies.  Encourage patient and caregiver participation in care to increase self-esteem, confidence and feelings of control.  Consider alternative and complementary therapy approaches such as meditation, mindfulness or yoga.  Encourage participation in cognitive behavioral therapy to foster a positive identity, increase self-awareness, as well as bolster self-esteem, confidence and self-efficacy.  Discuss spirituality; be present as concerns are identified; encourage journaling, prayer, worship services, meditation or pastoral counseling.  Encourage participation in pleasurable group activities such as hobbies, singing, sports or volunteering).  Encourage the use of mindfulness; refer for training or intensive intervention.  Consider the use of meditative movement therapy such as tai chi, yoga or qigong.  Promote a regular daily exercise program based on tolerance, ability and patient choice to support positive thinking about disease or aging.   Notes:   Task: Support  Psychosocial Response to Risk or Actual Health Condition  Note:   Care Management Activities:        Notes:   Problem Identified: Quality of Life (General Plan of Care)     Goal: Quality of Life Maintained   Note:   Evidence-based guidance:  Assess patient's thoughts about quality of life, goals and expectations, and dissatisfaction or desire to improve.  Identify issues of primary importance such as mental health, illness, exercise tolerance, pain, sexual function and intimacy, cognitive change, social isolation, finances and relationships.  Assess and monitor for signs/symptoms of psychosocial concerns, especially depression or ideations regarding harm to others or self; provide or refer for mental health services as needed.  Identify sensory issues that impact quality of life such as hearing loss, vision deficit; strategize ways to maintain or improve hearing, vision.  Promote access to services in the community to support independence such as support groups, home visiting programs, financial assistance, handicapped parking tags, durable medical equipment and emergency responder.  Promote activities to decrease social isolation such as group support or social, leisure and recreational activities, employment, use of social media; consider safety concerns about being out of home for activities.  Provide patient an opportunity to share by storytelling or a "life review" to give positive meaning to life and to assist with coping and negative experiences.  Encourage patient to tap into hope to improve sense of self.  Counsel based on prognosis and as early as possible about end-of-life and palliative care; consider referral to palliative care provider.  Advocate for the development of palliative care plan that may include avoidance of unnecessary testing and intervention, symptom control, discontinuation of medications, hospice and organ donation.  Counsel as early as possible those with  life-limiting chronic disease about palliative care; consider referral to palliative care provider.  Advocate for the development of palliative care plan.   Notes:   Task: Support and Maintain Acceptable Degree of Health, Comfort and Happiness   Note:   Care Management Activities:        Notes:   Patient Care Plan: General Pharmacy (Adult)    Problem Identified: Health Promotion or Disease Self-Management (General Plan of Care)     Goal: Self-Management Plan Developed   Note:   Evidence-based guidance:   Review biopsychosocial determinants of health screens.   Determine level of modifiable health risk.   Assess level of patient activation, level of readiness, importance and confidence to make changes.   Evoke change talk using open-ended questions, pros and cons, as well as looking forward.   Identify areas where behavior change may lead to improved health.   Partner with patient to develop a robust self-management plan that includes lifestyle factors, such as weight loss, exercise and healthy nutrition, as well as goals specific to disease risks.   Support patient and family/caregiver active participation in decision-making and self-management plan.   Implement additional goals and interventions based on identified risk factors to reduce health risk.   Facilitate advance care planning.   Review need for preventive screening based on age, sex, family history and health history.   Notes:    Task: Medication Management   Note:   Current Barriers:  . Does not adhere to prescribed medication regimen o Can't remember to take medications during the day o Has difficulty picking up medications from Pharmacy . Doesn't have BP Cuff  Pharmacist Clinical Goal(s):  Marland Kitchen Over the next 30 days, patient will achieve adherence to monitoring guidelines and medication adherence to achieve therapeutic efficacy through collaboration with PharmD and  provider.   .   Interventions: . Inter-disciplinary care team collaboration (see longitudinal plan of care) . Comprehensive medication review performed; medication list updated in electronic medical record  Verbal consent obtained for [facility] enhanced pharmacy services (medication synchronization, adherence packaging, delivery coordination). A medication sync plan was created to allow patient to get all medications delivered once every 30 to 90 days per patient preference. Patient understands they have freedom to choose pharmacy and clinical pharmacist will coordinate care between all prescribers and [facility].   _0 @ _1 @ _2 @  Patient Goals/Self-Care Activities . Over the next 30 days, patient will:  - take medications as prescribed check glucose Daily, document, and provide at future appointments check blood pressure Daily, document, and provide at future appointments  Follow Up Plan: The care management team will reach out to the patient again over the next 30 days.       Please see education materials related to DM and HTN provided as print materials.   Patient verbalizes understanding of instructions provided today.   The Managed Medicaid care management team will reach out to the patient again over the next 30 days.   Lane Hacker, [facility]  Hypertension, Adult Hypertension is another name for high blood pressure. High blood pressure forces your heart to work harder to pump blood. This can cause problems over time. There are two numbers in a blood pressure reading. There is a top number (systolic) over a bottom number (diastolic). It is best to have a blood pressure that is below 120/80. Healthy choices can help lower your blood pressure, or you may need medicine to help lower it. What are the causes? The cause of this condition is not known. Some conditions may be related to high blood pressure. What increases the  risk?  Smoking.  Having type 2 diabetes mellitus, high cholesterol, or both.  Not getting enough exercise or physical activity.  Being overweight.  Having too much fat, sugar, calories, or salt (sodium) in your diet.  Drinking too much alcohol.  Having long-term (chronic) kidney disease.  Having a family history of high blood pressure.  Age. Risk increases with age.  Race. You may be at higher risk if you are African American.  Gender. Men are at higher risk than women before age 44. After age 45, women are at higher risk than men.  Having obstructive sleep apnea.  Stress. What are the signs or symptoms?  High blood pressure may not cause symptoms. Very high blood pressure (hypertensive crisis) may cause: ? Headache. ? Feelings of worry or nervousness (anxiety). ? Shortness of breath. ? Nosebleed. ? A feeling of being sick to your stomach (nausea). ? Throwing up (vomiting). ? Changes in how you see. ? Very bad chest pain. ? Seizures. How is this treated?  This condition is treated by making healthy lifestyle changes, such as: ? Eating healthy foods. ? Exercising more. ? Drinking less alcohol.  Your health care provider may prescribe medicine if lifestyle changes are not enough to get your blood pressure under control, and if: ? Your top number is above 130. ? Your bottom number is above 80.  Your personal target blood pressure may vary. Follow these instructions at home: Eating and drinking   If told, follow the DASH eating plan. To follow this plan: ? Fill one half of your plate at each meal with fruits and vegetables. ? Fill one fourth of your plate at each meal with whole grains. Whole grains include whole-wheat  pasta, brown rice, and whole-grain bread. ? Eat or drink low-fat dairy products, such as skim milk or low-fat yogurt. ? Fill one fourth of your plate at each meal with low-fat (lean) proteins. Low-fat proteins include fish, chicken without skin,  eggs, beans, and tofu. ? Avoid fatty meat, cured and processed meat, or chicken with skin. ? Avoid pre-made or processed food.  Eat less than 1,500 mg of salt each day.  Do not drink alcohol if: ? Your doctor tells you not to drink. ? You are pregnant, may be pregnant, or are planning to become pregnant.  If you drink alcohol: ? Limit how much you use to:  0-1 drink a day for women.  0-2 drinks a day for men. ? Be aware of how much alcohol is in your drink. In the [place] one drink equals one 12 oz bottle of beer (355 mL), one 5 oz glass of wine (148 mL), or one 1 oz glass of hard liquor (44 mL). Lifestyle   Work with your doctor to stay at a healthy weight or to lose weight. Ask your doctor what the best weight is for you.  Get at least 30 minutes of exercise most days of the week. This may include walking, swimming, or biking.  Get at least 30 minutes of exercise that strengthens your muscles (resistance exercise) at least 3 days a week. This may include lifting weights or doing Pilates.  Do not use any products that contain nicotine or tobacco, such as cigarettes, e-cigarettes, and chewing tobacco. If you need help quitting, ask your doctor.  Check your blood pressure at home as told by your doctor.  Keep all follow-up visits as told by your doctor. This is important. Medicines  Take over-the-counter and prescription medicines only as told by your doctor. Follow directions carefully.  Do not skip doses of blood pressure medicine. The medicine does not work as well if you skip doses. Skipping doses also puts you at risk for problems.  Ask your doctor about side effects or reactions to medicines that you should watch for. Contact a doctor if you:  Think you are having a reaction to the medicine you are taking.  Have headaches that keep coming back (recurring).  Feel dizzy.  Have swelling in your ankles.  Have trouble with your vision. Get help right away if  you:  Get a very bad headache.  Start to feel mixed up (confused).  Feel weak or numb.  Feel faint.  Have very bad pain in your: ? Chest. ? Belly (abdomen).  Throw up more than once.  Have trouble breathing. Summary  Hypertension is another name for high blood pressure.  High blood pressure forces your heart to work harder to pump blood.  For most people, a normal blood pressure is less than 120/80.  Making healthy choices can help lower blood pressure. If your blood pressure does not get lower with healthy choices, you may need to take medicine. This information is not intended to replace advice given to you by your health care provider. Make sure you discuss any questions you have with your health care provider. Document Revised: 03/15/2018 Document Reviewed: 03/15/2018 Elsevier Patient Education  2020 [employer].

## 2020-07-22 NOTE — Patient Outreach (Signed)
Medicaid Managed Care    Pharmacy Note  07/22/2020 Name: [name] [name] MRN: [id number] DOB: [birth year]  [name] [name] is a 60 y.o. year old female who is a primary care patient of [name], MD. The [facility] Managed Care Coordination team was consulted for assistance with disease management and care coordination needs.    Engaged with patient Engaged with patient by telephone for follow up visit in response to referral for case management and/or care coordination services.  [name] [name] was given information about Managed Medicaid Care Coordination team services today. [name] [name] agreed to services and verbal consent obtained.   Objective:  Lab Results  Component Value Date   CREATININE 1.41 (H) 05/27/2020   CREATININE 1.68 (H) 11/08/2019   CREATININE 1.65 (H) 11/07/2019    Lab Results  Component Value Date   HGBA1C 6.3 06/03/2020       Component Value Date/Time   CHOL 147 01/29/2019 1118   TRIG 102 01/29/2019 1118   HDL 59 01/29/2019 1118   CHOLHDL 2.5 01/29/2019 1118   CHOLHDL 3 06/18/2014 0951   VLDL 19.6 06/18/2014 0951   LDLCALC 68 01/29/2019 1118    Other: (TSH, CBC, Vit D, etc.)  Clinical ASCVD: Yes  The 10-year ASCVD risk score [name], et al., 2013) is: 16.8%   Values used to calculate the score:     Age: 53 years     Sex: Female     Is Non-Hispanic African American: Yes     Diabetic: Yes     Tobacco smoker: No     Systolic Blood Pressure: 443 mmHg     Is BP treated: Yes     HDL Cholesterol: 59 mg/dL     Total Cholesterol: 147 mg/dL    Other: (CHADS2VASc if Afib, PHQ9 if depression, MMRC or CAT for COPD, ACT, DEXA)  BP Readings from Last 3 Encounters:  06/03/20 (!) 150/86  05/28/20 (!) 161/96  05/27/20 (!) 158/86    Assessment/Interventions: Review of patient past medical history, allergies, medications, health status, including review of consultants reports, laboratory and other test data, was performed as part of comprehensive  evaluation and provision of chronic care management services.     Cholesterol Lab Results  Component Value Date   CHOL 147 01/29/2019   CHOL 158 08/16/2017   CHOL 172 11/08/2016   Lab Results  Component Value Date   HDL 59 01/29/2019   HDL 65 08/16/2017   HDL 80 11/08/2016   Lab Results  Component Value Date   LDLCALC 68 01/29/2019   LDLCALC 74 08/16/2017   LDLCALC 72 11/08/2016   Lab Results  Component Value Date   TRIG 102 01/29/2019   TRIG 93 08/16/2017   TRIG 100 11/08/2016   Lab Results  Component Value Date   CHOLHDL 2.5 01/29/2019   CHOLHDL 2.4 08/16/2017   CHOLHDL 2.2 11/08/2016   No results found for: LDLDIRECT  Atorvastatin 44m Plan: At goal,  patient stable/ symptoms controlled. Patient is self described, "Bad at reading" and admitted she doesn't understand what this disease state means. She stated, "It's hard to remember to take something when you don't know what it's for." I explained in depth, using various metaphors.   Edema Furosemide -Wears stockings, states they hurt when she wears. 3/7 days she wears them -Could be ADR of of Amlodipine Plan: At goal,  patient stable/ symptoms controlled    DM Lab Results  Component Value Date   HGBA1C 6.3 06/03/2020   HGBA1C 6.8  01/30/2020   HGBA1C 10.0 (H) 08/18/2019   Lab Results  Component Value Date   MICROALBUR 4.17 (H) 06/30/2010   LDLCALC 68 01/29/2019   CREATININE 1.41 (H) 05/27/2020    Lab Results  Component Value Date   NA 141 05/27/2020   K 3.7 05/27/2020   CREATININE 1.41 (H) 05/27/2020   GFRNONAA 43 (L) 05/27/2020   GFRAA 38 (L) 11/08/2019   GLUCOSE 91 05/27/2020    ASA 1m Lantus 60units QD (Patient doing 65) Liraglutide 124m(Victoza) -sugars: 90-100's per patient Plan: At goal,  patient stable/ symptoms controlled.  Patient is self described, "Bad at reading" and admitted she doesn't understand what this disease state means. She stated, "It's hard to remember to take  something when you don't know what it's for." I explained in depth, using various metaphors. Patient agreed to start testing sugars consistently  Cardio BP Readings from Last 3 Encounters:  06/03/20 (!) 150/86  05/28/20 (!) 161/96  05/27/20 (!) 158/86   Hydralazine 10063mID Labetalol 300m39mD (Non compliant) Amlodipine 10mg19member 2021 Plan: BP not at goal but could be nervous. Patient will get BP monitor in next month to get more accurate readings Jan 2022: Patient hasn't gotten BP machine yet. Per Pharmacy, is non-compliant on Labetalol. Swelling could be due to Amlodipine (And renal issues). Will ask PCP about possibly starting ARB (PT allergic to ACE).  Patient is self described, "Bad at reading" and admitted she doesn't understand what this disease state means. She stated, "It's hard to remember to take something when you don't know what it's for." I explained in depth, using various metaphors.  Pain 06/30/20: No meds, 8-9/10 With meds, 0/10 Feet/Hands Nerve pain Pregabalin 75mg 85mPlan: At goal,  patient stable/ symptoms controlled  SDOH (Social Determinants of Health) assessments and interventions performed:    Care Plan  Allergies  Allergen Reactions  . Lisinopril Swelling    Medications Reviewed Today    Reviewed by KennedLane Hacker([facility]) on 06/30/20 at 1351  Med List Status: <None>  Medication Order Taking? Sig Documenting Provider Last Dose Status Informant  aspirin EC 81 MG EC tablet [id number] 1 tablet (81 mg total) by mouth daily.  Patient not taking: Reported on 05/28/2020   Patel,Fritzi MandesActive   atorvastatin (LIPITOR) 40 MG tablet [id number] 1 tablet (40 mg total) by mouth daily. HackneAlisa GraffTaking Active   blood glucose meter kit and supplies KIT [id number] based on patient and insurance preference. Use up to four times daily as directed. (FOR ICD-9 250.00, 250.01). Patel,Dustin FlockActive Self  ferrous  sulfate 325 (65 FE) MG tablet [id number] 1 tablet (325 mg total) by mouth 2 (two) times daily with a meal. Patel,Fritzi Mandesaking Active   furosemide (LASIX) 40 MG tablet [id number] 1 tablet (40 mg total) by mouth 2 (two) times daily. HackneAlisa GraffTaking Active   hydrALAZINE (APRESOLINE) 100 MG tablet [id number] 1 tablet (100 mg total) by mouth 3 (three) times daily for 90 doses. NewlinCharlott Rakesaking Active   insulin glargine (LANTUS SOLOSTAR) 100 UNIT/ML Solostar Pen [id number] 60 Units into the skin at bedtime. NewlinCharlott Rakesaking Active            Med Note (KENNEBonnita Levan3, 2021  9:22 AM) Patient doing 65 units  labetalol (NORMODYNE) 300  MG tablet [id number] Yes Take 1 tablet (300 mg total) by mouth 2 (two) times daily. [name], MD Taking Active   liraglutide (VICTOZA) 18 MG/3ML SOPN [id number] Yes Inject 1.8 mg into the skin daily. [name], MD Taking Active   Misc. Devices MISC [id number]  Rollator with Seat Shower Chair  Dx- CHF; Duration-lifetime. Weight 241 lbs [name], MD  Active Self  pregabalin (LYRICA) 75 MG capsule [id number] Yes Take 1 capsule (75 mg total) by mouth 2 (two) times daily. [name], MD Taking Active           Patient Active Problem List   Diagnosis Date Noted  . Morbid obesity ([place]) 11/23/2019  . Acute respiratory failure with hypoxia ([place]) 11/06/2019  . Chronic kidney disease, stage 3b ([place]) 11/06/2019  . COPD with acute bronchitis ([place])   . Acute kidney failure ([place]) 08/18/2019  . Left ventricular hypertrophy 07/21/2019  . Educated about COVID-19 virus infection 07/21/2019  . Acute on chronic diastolic heart failure ([place])   . Hypoxia   . Dyspnea on exertion 05/09/2019  . Sleep apnea 05/09/2019  . Hyperglycemia 04/09/2019  . Snoring 03/13/2019  . Symptomatic anemia 08/17/2018  . Acute renal failure (ARF) ([place]) 08/17/2018  . Chronic cystitis 08/03/2018  .  Diabetic neuropathy ([place]) 06/16/2017  . Non compliance w medication regimen 04/12/2017  . Family history of colon cancer in mother 11/17/2016  . Dyspareunia in female 11/11/2016  . Screen for colon cancer 11/11/2016  . History of ovarian cyst 11/11/2016  . Chronic hepatitis C without hepatic coma ([place]) 11/09/2016  . Hyperlipidemia 10/07/2016  . Insulin dependent type 2 diabetes mellitus ([place]) 01/29/2014  . Status post intraocular lens implant 11/08/2013  . PCO (posterior capsular opacification) 09/28/2013  . Pseudophakia 09/12/2013  . GERD (gastroesophageal reflux disease) 09/06/2013  . Hypertension 09/06/2013  . BREAST PAIN, BILATERAL 02/19/2010  . ANEMIA, IRON DEFICIENCY 10/03/2009  . LEUKOPENIA, MILD 09/19/2009  . Chest pain 09/19/2009  . LIPOMA 06/18/2009  . 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 TINEA PEDIS 05/07/2009  . SKIN LESION 05/07/2009  . COUGH 05/07/2009  . SUBSTANCE ABUSE, MULTIPLE 02/22/2007  . HCVD (hypertensive cardiovascular disease) 02/22/2007    Conditions to be addressed/monitored: HTN, HLD and DM  Patient Care Plan: General Plan of Care (Adult)    Problem Identified: Therapeutic Alliance (General Plan of Care)     Goal: Therapeutic Alliance Established   Note:   Evidence-based guidance:  Avoid value judgments; convey acceptance.  Encourage collaboration with the treatment team.  Establish rapport; develop trust relationship.  Therapist, art.  Provide emotional support; encourage patient to share feelings of anger, fear and anxiety.  Promote self-reliance and autonomy based on age and ability; discourage overprotection.  Use empathy and nonjudgmental, participatory manner.   Notes:   Task: Develop Relationship to Effect Behavior Change   Note:   Care Management Activities:        Notes:   Problem Identified: Health Promotion or Disease Self-Management (General Plan of Care)     Goal: Self-Management Plan Developed   Note:    Evidence-based guidance:  Review biopsychosocial determinants of health screens.  Determine level of modifiable health risk.  Assess level of patient activation, level of readiness, importance and confidence to make changes.  Evoke change talk using open-ended questions, pros and cons, as well as looking forward.  Identify areas where behavior change may lead to improved health.  Partner with patient to develop a robust self-management plan that includes lifestyle factors,  such as weight loss, exercise and healthy nutrition, as well as goals specific to disease risks.  Support patient and family/caregiver active participation in decision-making and self-management plan.  Implement additional goals and interventions based on identified risk factors to reduce health risk.  Facilitate advance care planning.  Review need for preventive screening based on age, sex, family history and health history.   Notes:   Task: Mutually Develop and Foster Achievement of Patient Goals   Note:   Care Management Activities:        Notes:   Problem Identified: Coping Skills (General Plan of Care)     Goal: Coping Skills Enhanced   Note:   Evidence-based guidance:  Acknowledge, normalize and validate difficulty of making life-long lifestyle changes.  Identify current effective and ineffective coping strategies.  Encourage patient and caregiver participation in care to increase self-esteem, confidence and feelings of control.  Consider alternative and complementary therapy approaches such as meditation, mindfulness or yoga.  Encourage participation in cognitive behavioral therapy to foster a positive identity, increase self-awareness, as well as bolster self-esteem, confidence and self-efficacy.  Discuss spirituality; be present as concerns are identified; encourage journaling, prayer, worship services, meditation or pastoral counseling.  Encourage participation in pleasurable group activities such as  hobbies, singing, sports or volunteering).  Encourage the use of mindfulness; refer for training or intensive intervention.  Consider the use of meditative movement therapy such as tai chi, yoga or qigong.  Promote a regular daily exercise program based on tolerance, ability and patient choice to support positive thinking about disease or aging.   Notes:   Task: Support Psychosocial Response to Risk or Actual Health Condition   Note:   Care Management Activities:        Notes:   Problem Identified: Quality of Life (General Plan of Care)     Goal: Quality of Life Maintained   Note:   Evidence-based guidance:  Assess patient's thoughts about quality of life, goals and expectations, and dissatisfaction or desire to improve.  Identify issues of primary importance such as mental health, illness, exercise tolerance, pain, sexual function and intimacy, cognitive change, social isolation, finances and relationships.  Assess and monitor for signs/symptoms of psychosocial concerns, especially depression or ideations regarding harm to others or self; provide or refer for mental health services as needed.  Identify sensory issues that impact quality of life such as hearing loss, vision deficit; strategize ways to maintain or improve hearing, vision.  Promote access to services in the community to support independence such as support groups, home visiting programs, financial assistance, handicapped parking tags, durable medical equipment and emergency responder.  Promote activities to decrease social isolation such as group support or social, leisure and recreational activities, employment, use of social media; consider safety concerns about being out of home for activities.  Provide patient an opportunity to share by storytelling or a "life review" to give positive meaning to life and to assist with coping and negative experiences.  Encourage patient to tap into hope to improve sense of self.  Counsel  based on prognosis and as early as possible about end-of-life and palliative care; consider referral to palliative care provider.  Advocate for the development of palliative care plan that may include avoidance of unnecessary testing and intervention, symptom control, discontinuation of medications, hospice and organ donation.  Counsel as early as possible those with life-limiting chronic disease about palliative care; consider referral to palliative care provider.  Advocate for the development of palliative care plan.  Notes:   Task: Support and Maintain Acceptable Degree of Health, Comfort and Happiness   Note:   Care Management Activities:        Notes:   Patient Care Plan: General Pharmacy (Adult)    Problem Identified: Health Promotion or Disease Self-Management (General Plan of Care)     Goal: Self-Management Plan Developed   Note:   Evidence-based guidance:   Review biopsychosocial determinants of health screens.   Determine level of modifiable health risk.   Assess level of patient activation, level of readiness, importance and confidence to make changes.   Evoke change talk using open-ended questions, pros and cons, as well as looking forward.   Identify areas where behavior change may lead to improved health.   Partner with patient to develop a robust self-management plan that includes lifestyle factors, such as weight loss, exercise and healthy nutrition, as well as goals specific to disease risks.   Support patient and family/caregiver active participation in decision-making and self-management plan.   Implement additional goals and interventions based on identified risk factors to reduce health risk.   Facilitate advance care planning.   Review need for preventive screening based on age, sex, family history and health history.   Notes:    Task: Medication Management   Note:   Current Barriers:  . Does not adhere to prescribed medication regimen o Can't  remember to take medications during the day o Has difficulty picking up medications from Pharmacy . Doesn't have BP Cuff  Pharmacist Clinical Goal(s):  Marland Kitchen Over the next 30 days, patient will achieve adherence to monitoring guidelines and medication adherence to achieve therapeutic efficacy through collaboration with PharmD and provider.  .   Interventions: . Inter-disciplinary care team collaboration (see longitudinal plan of care) . Comprehensive medication review performed; medication list updated in electronic medical record  Verbal consent obtained for [facility] enhanced pharmacy services (medication synchronization, adherence packaging, delivery coordination). A medication sync plan was created to allow patient to get all medications delivered once every 30 to 90 days per patient preference. Patient understands they have freedom to choose pharmacy and clinical pharmacist will coordinate care between all prescribers and [facility].   _0 @ _1 @ _2 @  Patient Goals/Self-Care Activities . Over the next 30 days, patient will:  - take medications as prescribed check glucose Daily, document, and provide at future appointments check blood pressure Daily, document, and provide at future appointments  Follow Up Plan: The care management team will reach out to the patient again over the next 30 days.       Medication Assistance: Patient unable to organize medications and forgets often. Also has trouble picking medications up from Pharmacy   Verbal consent obtained for [facility] enhanced pharmacy services (medication synchronization, adherence packaging, delivery coordination). A medication sync plan was created to allow patient to get all medications delivered once every 30 to 90 days per patient preference. Patient understands they have freedom to choose pharmacy and clinical pharmacist will coordinate care between all  prescribers and [facility].    Follow up: Agree  Plan: The care management team will reach out to the patient again over the next 30 days.   [place] Constable, Pharm.D., Managed Medicaid Pharmacist - [phone]

## 2020-07-25 ENCOUNTER — Emergency Department: Payer: Medicaid Other

## 2020-07-25 ENCOUNTER — Other Ambulatory Visit: Payer: Self-pay

## 2020-07-25 ENCOUNTER — Inpatient Hospital Stay
Admission: EM | Admit: 2020-07-25 | Discharge: 2020-07-27 | DRG: 177 | Disposition: A | Discharge status: Home / Self Care | Payer: Medicaid Other | Attending: Internal Medicine | Admitting: Internal Medicine

## 2020-07-25 DIAGNOSIS — N1832 Chronic kidney disease, stage 3b: Secondary | ICD-10-CM | POA: Diagnosis not present

## 2020-07-25 DIAGNOSIS — I10 Essential (primary) hypertension: Secondary | ICD-10-CM | POA: Diagnosis not present

## 2020-07-25 DIAGNOSIS — Z794 Long term (current) use of insulin: Secondary | ICD-10-CM

## 2020-07-25 DIAGNOSIS — D696 Thrombocytopenia, unspecified: Secondary | ICD-10-CM | POA: Diagnosis present

## 2020-07-25 DIAGNOSIS — F1410 Cocaine abuse, uncomplicated: Secondary | ICD-10-CM | POA: Diagnosis present

## 2020-07-25 DIAGNOSIS — D631 Anemia in chronic kidney disease: Secondary | ICD-10-CM | POA: Diagnosis present

## 2020-07-25 DIAGNOSIS — R059 Cough, unspecified: Secondary | ICD-10-CM | POA: Diagnosis not present

## 2020-07-25 DIAGNOSIS — G4730 Sleep apnea, unspecified: Secondary | ICD-10-CM | POA: Diagnosis not present

## 2020-07-25 DIAGNOSIS — R0902 Hypoxemia: Secondary | ICD-10-CM | POA: Diagnosis present

## 2020-07-25 DIAGNOSIS — U071 COVID-19: Principal | ICD-10-CM | POA: Diagnosis present

## 2020-07-25 DIAGNOSIS — I130 Hypertensive heart and chronic kidney disease with heart failure and stage 1 through stage 4 chronic kidney disease, or unspecified chronic kidney disease: Secondary | ICD-10-CM | POA: Diagnosis present

## 2020-07-25 DIAGNOSIS — E1122 Type 2 diabetes mellitus with diabetic chronic kidney disease: Secondary | ICD-10-CM | POA: Diagnosis present

## 2020-07-25 DIAGNOSIS — E669 Obesity, unspecified: Secondary | ICD-10-CM | POA: Diagnosis not present

## 2020-07-25 DIAGNOSIS — Z6841 Body Mass Index (BMI) 40.0 and over, adult: Secondary | ICD-10-CM | POA: Diagnosis not present

## 2020-07-25 DIAGNOSIS — B182 Chronic viral hepatitis C: Secondary | ICD-10-CM | POA: Diagnosis not present

## 2020-07-25 DIAGNOSIS — Z800 Family history of malignant neoplasm of digestive organs: Secondary | ICD-10-CM | POA: Diagnosis not present

## 2020-07-25 DIAGNOSIS — Z79899 Other long term (current) drug therapy: Secondary | ICD-10-CM | POA: Diagnosis not present

## 2020-07-25 DIAGNOSIS — R0602 Shortness of breath: Secondary | ICD-10-CM | POA: Diagnosis not present

## 2020-07-25 DIAGNOSIS — E119 Type 2 diabetes mellitus without complications: Secondary | ICD-10-CM

## 2020-07-25 DIAGNOSIS — I509 Heart failure, unspecified: Secondary | ICD-10-CM | POA: Diagnosis not present

## 2020-07-25 DIAGNOSIS — E1140 Type 2 diabetes mellitus with diabetic neuropathy, unspecified: Secondary | ICD-10-CM | POA: Diagnosis present

## 2020-07-25 DIAGNOSIS — I5032 Chronic diastolic (congestive) heart failure: Secondary | ICD-10-CM | POA: Diagnosis present

## 2020-07-25 DIAGNOSIS — J9601 Acute respiratory failure with hypoxia: Secondary | ICD-10-CM | POA: Diagnosis not present

## 2020-07-25 DIAGNOSIS — Z888 Allergy status to other drugs, medicaments and biological substances status: Secondary | ICD-10-CM

## 2020-07-25 DIAGNOSIS — G4733 Obstructive sleep apnea (adult) (pediatric): Secondary | ICD-10-CM | POA: Diagnosis present

## 2020-07-25 DIAGNOSIS — R079 Chest pain, unspecified: Secondary | ICD-10-CM | POA: Diagnosis not present

## 2020-07-25 DIAGNOSIS — J069 Acute upper respiratory infection, unspecified: Secondary | ICD-10-CM | POA: Diagnosis not present

## 2020-07-25 DIAGNOSIS — E1169 Type 2 diabetes mellitus with other specified complication: Secondary | ICD-10-CM

## 2020-07-25 LAB — CBC
HCT: 32.1 % — ABNORMAL LOW (ref 36.0–46.0)
Hemoglobin: 10.1 g/dL — ABNORMAL LOW (ref 12.0–15.0)
MCH: 28.5 pg (ref 26.0–34.0)
MCHC: 31.5 g/dL (ref 30.0–36.0)
MCV: 90.4 fL (ref 80.0–100.0)
Platelets: 135 10*3/uL — ABNORMAL LOW (ref 150–400)
RBC: 3.55 MIL/uL — ABNORMAL LOW (ref 3.87–5.11)
RDW: 14.1 % (ref 11.5–15.5)
WBC: 3.6 10*3/uL — ABNORMAL LOW (ref 4.0–10.5)
nRBC: 0 % (ref 0.0–0.2)

## 2020-07-25 LAB — CBC WITH DIFFERENTIAL/PLATELET
Abs Immature Granulocytes: 0.01 10*3/uL (ref 0.00–0.07)
Basophils Absolute: 0 10*3/uL (ref 0.0–0.1)
Basophils Relative: 0 %
Eosinophils Absolute: 0 10*3/uL (ref 0.0–0.5)
Eosinophils Relative: 0 %
HCT: 31.6 % — ABNORMAL LOW (ref 36.0–46.0)
HCT: 31.8 % — ABNORMAL LOW (ref 36.0–46.0)
Hemoglobin: 9.8 g/dL — ABNORMAL LOW (ref 12.0–15.0)
Hemoglobin: 9.8 g/dL — ABNORMAL LOW (ref 12.0–15.0)
Immature Granulocytes: 0 %
Lymphocytes Relative: 33 %
Lymphs Abs: 1.2 10*3/uL (ref 0.7–4.0)
MCH: 28 pg (ref 26.0–34.0)
MCH: 28.1 pg (ref 26.0–34.0)
MCHC: 30.8 g/dL (ref 30.0–36.0)
MCHC: 31 g/dL (ref 30.0–36.0)
MCV: 90.5 fL (ref 80.0–100.0)
MCV: 90.9 fL (ref 80.0–100.0)
Monocytes Absolute: 0.2 10*3/uL (ref 0.1–1.0)
Monocytes Relative: 6 %
Neutro Abs: 2.2 10*3/uL (ref 1.7–7.7)
Neutrophils Relative %: 61 %
Platelets: 132 10*3/uL — ABNORMAL LOW (ref 150–400)
Platelets: 133 10*3/uL — ABNORMAL LOW (ref 150–400)
RBC: 3.49 MIL/uL — ABNORMAL LOW (ref 3.87–5.11)
RBC: 3.5 MIL/uL — ABNORMAL LOW (ref 3.87–5.11)
RDW: 14.1 % (ref 11.5–15.5)
RDW: 14.2 % (ref 11.5–15.5)
WBC: 3.5 10*3/uL — ABNORMAL LOW (ref 4.0–10.5)
WBC: 3.6 10*3/uL — ABNORMAL LOW (ref 4.0–10.5)
nRBC: 0 % (ref 0.0–0.2)
nRBC: 0 % (ref 0.0–0.2)

## 2020-07-25 LAB — BASIC METABOLIC PANEL
Anion gap: 9 (ref 5–15)
BUN: 18 mg/dL (ref 6–20)
CO2: 29 mmol/L (ref 22–32)
Calcium: 8.3 mg/dL — ABNORMAL LOW (ref 8.9–10.3)
Chloride: 105 mmol/L (ref 98–111)
Creatinine, Ser: 1.59 mg/dL — ABNORMAL HIGH (ref 0.44–1.00)
GFR, Estimated: 37 mL/min — ABNORMAL LOW (ref >60)
Glucose, Bld: 129 mg/dL — ABNORMAL HIGH (ref 70–99)
Potassium: 3.7 mmol/L (ref 3.5–5.1)
Sodium: 143 mmol/L (ref 135–145)

## 2020-07-25 LAB — TROPONIN I (HIGH SENSITIVITY)
Troponin I (High Sensitivity): 6 ng/L (ref <18)
Troponin I (High Sensitivity): 7 ng/L (ref <18)

## 2020-07-25 LAB — CBG MONITORING, ED: Glucose-Capillary: 259 mg/dL — ABNORMAL HIGH (ref 70–99)

## 2020-07-25 LAB — POC SARS CORONAVIRUS 2 AG -  ED: SARS Coronavirus 2 Ag: POSITIVE — AB

## 2020-07-25 MED ORDER — SODIUM CHLORIDE 0.9 % IV BOLUS
500.0000 mL | Freq: Once | INTRAVENOUS | Status: AC
  Administered 2020-07-25: 500 mL via INTRAVENOUS

## 2020-07-25 MED ORDER — INSULIN GLARGINE 100 UNIT/ML ~~LOC~~ SOLN
60.0000 [IU] | Freq: Every day | SUBCUTANEOUS | Status: DC
  Administered 2020-07-25 – 2020-07-27 (×2): 60 [IU] via SUBCUTANEOUS
  Filled 2020-07-25 (×3): qty 0.6

## 2020-07-25 MED ORDER — DEXAMETHASONE SODIUM PHOSPHATE 10 MG/ML IJ SOLN
10.0000 mg | Freq: Once | INTRAMUSCULAR | Status: AC
  Administered 2020-07-25: 10 mg via INTRAVENOUS
  Filled 2020-07-25: qty 1

## 2020-07-25 MED ORDER — SODIUM CHLORIDE 0.9 % IV SOLN
200.0000 mg | Freq: Once | INTRAVENOUS | Status: AC
  Administered 2020-07-25: 200 mg via INTRAVENOUS
  Filled 2020-07-25: qty 200

## 2020-07-25 MED ORDER — SODIUM CHLORIDE 0.9 % IV SOLN: 250.0000 mL | INTRAVENOUS | Status: DC | PRN

## 2020-07-25 MED ORDER — ENOXAPARIN SODIUM 40 MG/0.4ML ~~LOC~~ SOLN: 40.0000 mg | SUBCUTANEOUS | Status: DC

## 2020-07-25 MED ORDER — LIRAGLUTIDE 18 MG/3ML ~~LOC~~ SOPN: 1.8000 mg | PEN_INJECTOR | Freq: Every day | SUBCUTANEOUS | Status: DC

## 2020-07-25 MED ORDER — INSULIN ASPART 100 UNIT/ML ~~LOC~~ SOLN
0.0000 [IU] | Freq: Three times a day (TID) | SUBCUTANEOUS | Status: DC
  Administered 2020-07-26: 3 [IU] via SUBCUTANEOUS
  Administered 2020-07-26: 2 [IU] via SUBCUTANEOUS
  Administered 2020-07-26: 8 [IU] via SUBCUTANEOUS
  Administered 2020-07-27: 5 [IU] via SUBCUTANEOUS
  Filled 2020-07-25 (×4): qty 1

## 2020-07-25 MED ORDER — SODIUM CHLORIDE 0.9% FLUSH: 3.0000 mL | INTRAVENOUS | Status: DC | PRN

## 2020-07-25 MED ORDER — ATORVASTATIN CALCIUM 20 MG PO TABS
40.0000 mg | ORAL_TABLET | Freq: Every day | ORAL | Status: DC
  Administered 2020-07-26 – 2020-07-27 (×2): 40 mg via ORAL
  Filled 2020-07-25 (×2): qty 2

## 2020-07-25 MED ORDER — SODIUM CHLORIDE 0.9% FLUSH
3.0000 mL | Freq: Two times a day (BID) | INTRAVENOUS | Status: DC
  Administered 2020-07-26 – 2020-07-27 (×4): 3 mL via INTRAVENOUS

## 2020-07-25 MED ORDER — ENOXAPARIN SODIUM 60 MG/0.6ML ~~LOC~~ SOLN
0.5000 mg/kg | SUBCUTANEOUS | Status: DC
  Administered 2020-07-25 – 2020-07-27 (×2): 57.5 mg via SUBCUTANEOUS
  Filled 2020-07-25 (×2): qty 0.6

## 2020-07-25 MED ORDER — HYDRALAZINE HCL 50 MG PO TABS
25.0000 mg | ORAL_TABLET | Freq: Three times a day (TID) | ORAL | Status: DC
  Administered 2020-07-25 – 2020-07-26 (×2): 25 mg via ORAL
  Filled 2020-07-25 (×2): qty 1

## 2020-07-25 MED ORDER — SODIUM CHLORIDE 0.9 % IV SOLN
100.0000 mg | Freq: Every day | INTRAVENOUS | Status: DC
  Administered 2020-07-26 – 2020-07-27 (×2): 100 mg via INTRAVENOUS
  Filled 2020-07-25 (×2): qty 20

## 2020-07-25 MED ORDER — FUROSEMIDE 40 MG PO TABS
40.0000 mg | ORAL_TABLET | Freq: Two times a day (BID) | ORAL | Status: DC
  Administered 2020-07-26 – 2020-07-27 (×3): 40 mg via ORAL
  Filled 2020-07-25 (×3): qty 1

## 2020-07-25 MED ORDER — INSULIN GLARGINE 100 UNIT/ML ~~LOC~~ SOLN: 50.0000 [IU] | Freq: Every day | SUBCUTANEOUS | Status: DC

## 2020-07-25 MED ORDER — AMLODIPINE BESYLATE 5 MG PO TABS
10.0000 mg | ORAL_TABLET | Freq: Every day | ORAL | Status: DC
  Administered 2020-07-26 – 2020-07-27 (×2): 10 mg via ORAL
  Filled 2020-07-25 (×2): qty 2

## 2020-07-25 MED ORDER — PREGABALIN 25 MG PO CAPS
75.0000 mg | ORAL_CAPSULE | Freq: Two times a day (BID) | ORAL | Status: DC
  Administered 2020-07-25 – 2020-07-27 (×4): 75 mg via ORAL
  Filled 2020-07-25 (×4): qty 3

## 2020-07-25 MED ORDER — INSULIN GLARGINE 100 UNIT/ML SOLOSTAR PEN: 60.0000 [IU] | PEN_INJECTOR | Freq: Every day | SUBCUTANEOUS | Status: DC

## 2020-07-25 MED ORDER — DEXAMETHASONE 6 MG PO TABS
6.0000 mg | ORAL_TABLET | ORAL | Status: DC
  Administered 2020-07-26 – 2020-07-27 (×2): 6 mg via ORAL
  Filled 2020-07-25 (×3): qty 1

## 2020-07-25 MED ORDER — ACETAMINOPHEN 325 MG PO TABS: 650.0000 mg | ORAL_TABLET | Freq: Four times a day (QID) | ORAL | Status: DC | PRN

## 2020-07-25 NOTE — Consult Note (Signed)
Remdesivir - Pharmacy Brief Note  C/O cought, CP, SOB x1wk, with expiratory wheeze on presentation improving with [place] 2L. O:  ALT: NNL (BL 12 - 10/2019) CXR: No active cardiopulmonary disease SpO2: 94% on 2L [place]   A/P:  Remdesivir 200 mg IVPB once followed by 100 mg IVPB daily x 4 days.   Lorna Dibble, [facility] 07/25/2020 11:41 AM

## 2020-07-25 NOTE — H&P (Addendum)
History and Physical    [name] [name] YIR:[id number] DOB: [birth year] DOA: 07/25/2020  PCP: [name], MD  Patient coming from: home   Chief Complaint: malaise  HPI: [name] [name] is a 60 y.o. female with medical history significant for morbid obesity, distant hx cocaine abuse, insulin-dependent dm, ckd 3, hcv treated, osa not on cpap, hfpef on lasix, htn, and anemia, who presents with the above.  Vaccinated, 2nd dose in June, hasn't been boosted.  Symptoms began 1/1. Nasal congestion, body aches, headache, general weakness/malaise. No fevers. Also loose BMs, no blood/mucous. No vomiting, is tolerating PO. Has had several days shortness of breath. All the above worsening. Given feeling very ill went to ED today. No chest pain or dysuria. Has a close contact w/ covid.   ED Course:   Labs significant for covid positive. cxr clear. o2 86% on room air improved to normal w/ 2 L [place]. remdesivir and decadron given.  Review of Systems: As per HPI otherwise 10 point review of systems negative.    Past Medical History:  Diagnosis Date  . Anemia   . CHF (congestive heart failure) ([place])   . Chronic hepatitis C without hepatic coma ([place]) 11/09/2016  . Diabetes mellitus   . Fibroids   . 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 Hypertension   . MRSA (methicillin resistant Staphylococcus aureus)   . Trichomonas   . VAGINITIS, BACTERIAL, RECURRENT 08/15/2007   Qualifier: Diagnosis of  By: Radene Ou MD, [place]      Past Surgical History:  Procedure Laterality Date  . BREAST BIOPSY Left 2018  . CESAREAN SECTION     breech     reports that she has never smoked. She has never used smokeless tobacco. She reports that she does not drink alcohol and does not use drugs.  Allergies  Allergen Reactions  . Lisinopril Swelling    Family History  Problem Relation Age of Onset  . Colon cancer Mother   . Liver disease Sister   . Other Neg Hx   . Breast cancer  Neg Hx   . Esophageal cancer Neg Hx   . Rectal cancer Neg Hx     Prior to Admission medications   Medication Sig Start Date End Date Taking? Authorizing Provider  amLODipine (NORVASC) 10 MG tablet Take 10 mg by mouth daily. 06/14/20   [provider]  atorvastatin (LIPITOR) 40 MG tablet Take 1 tablet (40 mg total) by mouth daily. 12/05/19   [name], FNP  blood glucose meter kit and supplies KIT Dispense based on patient and insurance preference. Use up to four times daily as directed. (FOR ICD-9 250.00, 250.01). 04/12/19   Dustin Flock, MD  ferrous sulfate 325 (65 FE) MG tablet Take 1 tablet (325 mg total) by mouth 2 (two) times daily with a meal. 11/08/19   Fritzi Mandes, MD  furosemide (LASIX) 40 MG tablet Take 1 tablet (40 mg total) by mouth 2 (two) times daily. 12/05/19 12/04/20  [name], FNP  hydrALAZINE (APRESOLINE) 100 MG tablet Take 1 tablet (100 mg total) by mouth 3 (three) times daily for 90 doses. 06/03/20 07/03/20  [name], MD  hydrALAZINE (APRESOLINE) 25 MG tablet Take 25 mg by mouth 3 (three) times daily. 06/26/20   [provider]  ibuprofen (ADVIL) 800 MG tablet Take 800 mg by mouth every 8 (eight) hours as needed. 02/07/20   [provider]  insulin glargine (LANTUS SOLOSTAR) 100  UNIT/ML Solostar Pen Inject 60 Units into the skin at bedtime. 06/03/20   Newlin, Enobong, MD  labetalol (NORMODYNE) 300 MG tablet Take 1 tablet (300 mg total) by mouth 2 (two) times daily. 06/03/20   Newlin, Enobong, MD  liraglutide (VICTOZA) 18 MG/3ML SOPN Inject 1.8 mg into the skin daily. 06/03/20   Newlin, Enobong, MD  Misc. Devices MISC Rollator with Seat Shower Chair  Dx- CHF; Duration-lifetime. Weight 241 lbs 09/04/19   Newlin, Enobong, MD  pregabalin (LYRICA) 75 MG capsule Take 1 capsule (75 mg total) by mouth 2 (two) times daily. 06/03/20   Newlin, Enobong, MD  Vitamin D, Ergocalciferol, (DRISDOL) 1.25 MG (50000 UNIT) CAPS capsule Take 50,000 Units  by mouth once a week. 05/06/20   [provider]    Physical Exam: Vitals:   07/25/20 0906 07/25/20 0909 07/25/20 1143 07/25/20 1222  BP:   (!) 159/90 (!) 155/74  Pulse:  93 90 88  Resp:  18 17 17  Temp:      TempSrc:      SpO2:  94% 97% 95%  Weight: 112.5 kg     Height: 5' 4" (1.626 m)       Constitutional: No acute distress Head: Atraumatic Eyes: Conjunctiva clear ENM: Moist mucous membranes. Normal dentition.  Neck: Supple Respiratory: few scattered rhonchi Cardiovascular: Regular rate and rhythm. Distant heart sounds. Faint systolic murmur Abdomen: Non-tender, obese. No masses. No rebound or guarding. Positive bowel sounds. Musculoskeletal: No joint deformity upper and lower extremities. Normal ROM, no contractures. Normal muscle tone.  Skin: No rashes, lesions, or ulcers.  Extremities: trace LE peripheral edema. Palpable peripheral pulses. Neurologic: Alert, moving all 4 extremities. Psychiatric: Normal insight and judgement.   Labs on Admission: I have personally reviewed following labs and imaging studies  CBC: Recent Labs  Lab 07/25/20 0914  WBC 3.6*  3.5*  3.6*  NEUTROABS 2.2  THIS TEST WAS ORDERED IN ERROR AND HAS BEEN CREDITED.  HGB 9.8*  9.8*  10.1*  HCT 31.6*  31.8*  32.1*  MCV 90.5  90.9  90.4  PLT 133*  132*  135*   Basic Metabolic Panel: Recent Labs  Lab 07/25/20 0914  NA 143  K 3.7  CL 105  CO2 29  GLUCOSE 129*  BUN 18  CREATININE 1.59*  CALCIUM 8.3*   GFR: Estimated Creatinine Clearance: 46.8 mL/min (A) (by C-G formula based on SCr of 1.59 mg/dL (H)). Liver Function Tests: No results for input(s): AST, ALT, ALKPHOS, BILITOT, PROT, ALBUMIN in the last 168 hours. No results for input(s): LIPASE, AMYLASE in the last 168 hours. No results for input(s): AMMONIA in the last 168 hours. Coagulation Profile: No results for input(s): INR, PROTIME in the last 168 hours. Cardiac Enzymes: No results for input(s): CKTOTAL,  CKMB, CKMBINDEX, TROPONINI in the last 168 hours. BNP (last 3 results) No results for input(s): PROBNP in the last 8760 hours. HbA1C: No results for input(s): HGBA1C in the last 72 hours. CBG: No results for input(s): GLUCAP in the last 168 hours. Lipid Profile: No results for input(s): CHOL, HDL, LDLCALC, TRIG, CHOLHDL, LDLDIRECT in the last 72 hours. Thyroid Function Tests: No results for input(s): TSH, T4TOTAL, FREET4, T3FREE, THYROIDAB in the last 72 hours. Anemia Panel: No results for input(s): VITAMINB12, FOLATE, FERRITIN, TIBC, IRON, RETICCTPCT in the last 72 hours. Urine analysis:    Component Value Date/Time   COLORURINE STRAW (A) 04/09/2019 1615   APPEARANCEUR CLOUDY (A) 04/09/2019 1615   LABSPEC 1.010 04/09/2019 1615     PHURINE 5.0 04/09/2019 1615   GLUCOSEU >=500 (A) 04/09/2019 1615   HGBUR SMALL (A) 04/09/2019 1615   HGBUR negative 06/29/2010 1356   BILIRUBINUR NEGATIVE 04/09/2019 1615   KETONESUR NEGATIVE 04/09/2019 1615   PROTEINUR 100 (A) 04/09/2019 1615   UROBILINOGEN 1.0 01/26/2015 0925   NITRITE NEGATIVE 04/09/2019 1615   LEUKOCYTESUR SMALL (A) 04/09/2019 1615    Radiological Exams on Admission: DG Chest 2 View  Result Date: 07/25/2020 CLINICAL DATA:  Patient reports mid chest pain, SOB, productive cough with yellow mucous, and chills x 5 days. Patient was placed on 2L of oxygen in triage. Hx of HTN, CHF, and diabetes. EXAM: CHEST - 2 VIEW COMPARISON:  11/05/2019. FINDINGS: Cardiac silhouette is top-normal in size. No mediastinal or hilar masses. No evidence of adenopathy. Clear lungs.  No pleural effusion or pneumothorax. Skeletal structures are intact. IMPRESSION: No active cardiopulmonary disease. Electronically Signed   By: Lajean Manes M.D.   On: 07/25/2020 09:42    EKG: Independently reviewed. nsr  Assessment/Plan Active Problems:   Insulin dependent type 2 diabetes mellitus (HCC)   Chronic hepatitis C without hepatic coma (HCC)   Sleep apnea    Hypoxia   Chronic kidney disease, stage 3b (HCC)   Hypertension   Acute respiratory disease due to COVID-19 virus   Acute hypoxemic respiratory failure due to COVID-19 [facility])    # Covid infection # Acute hypoxic respiratory failure CXR clear but hypoxic, resolved w/ 2 L. Trop negative. Given unremarkable cxr think PE less likely. Troponin wnl. - continue remdesivir, decadron - [place] O2 - pulmonary toilet - daily inflammatory markers  # T2DM Here glucose wnl - lantus 50 qhs for home 60 - SSI  - hold home victoza  # HCV # Thrombocytopenia Treated HCV, unclear if has had f/u labs to confirm erradication. Hx borderline low platelets - monitor  # OSA Not on cpap at home  # HFpEF Compensated - cont home lasix 40 bid  # CKD3 Kidney function @ baseline - monitor  # Anemia H 10.1, stable from baseline. W/u in 2020 included egd/colonoscopy that were normal - monitor  # HTN Here systolic 852 - resume home amlodipine, lasix, hydralazine, atorvastatin  # Neuropathy - cont home lyrica  DVT prophylaxis: lovenox Code Status: full  Family Communication: friend larry updated telephonically Consults called: none    Status is: Inpatient  Remains inpatient appropriate because:Inpatient level of care appropriate due to severity of illness   Dispo: The patient is from: Home              Anticipated d/c is to: Home              Anticipated d/c date is: 2-4 days              Patient currently is not medically stable to d/c.        Desma Maxim MD [facility] Pager [phone]  If 7PM-7AM, please contact night-coverage [email or web address] Password [facility]  07/25/2020, 12:41 PM

## 2020-07-25 NOTE — Progress Notes (Signed)
PHARMACIST - PHYSICIAN COMMUNICATION  CONCERNING:  Enoxaparin (Lovenox) for DVT Prophylaxis    RECOMMENDATION: Patient was prescribed enoxaprin 40mg  q24 hours for VTE prophylaxis.   Filed Weights   07/25/20 0906  Weight: 112.5 kg (248 lb)    Body mass index is 42.57 kg/m.  Estimated Creatinine Clearance: 46.8 mL/min (A) (by C-G formula based on SCr of 1.59 mg/dL (H)).   Based on [place] patient is candidate for enoxaparin 0.5mg /kg TBW SQ every 24 hours based on BMI being >30.  DESCRIPTION: Pharmacy has adjusted enoxaparin dose per [facility] policy.  Patient is now receiving enoxaparin 0.5 mg/kg every 12 hours   Renda Rolls, PharmD, [facility] 07/25/2020 10:01 PM

## 2020-07-25 NOTE — ED Provider Notes (Signed)
[facility] Emergency Department Provider Note  ____________________________________________  Time seen: Approximately 12:33 PM  I have reviewed the triage vital signs and the nursing notes.   HISTORY  Chief Complaint Cough (Chest pain//) and Chest Pain    HPI [name] [name] is a 60 y.o. female with a history of diabetes, CHF, hypertension, obesity, CKD who comes the ED complaining of shortness of breath, cough, fatigue, body aches, loss of appetite and poor oral intake, diarrhea for the past 5 days.  Constant, gradual onset, worsening, no alleviating factors.  She gets very short of breath when she tries to walk and can only walk a few steps at a time at this point.  Denies syncope or trauma.  No chest pain.        Past Medical History:  Diagnosis Date  . Anemia   . CHF (congestive heart failure) ([place])   . Chronic hepatitis C without hepatic coma ([place]) 11/09/2016  . Diabetes mellitus   . Fibroids   . 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 Hypertension   . MRSA (methicillin resistant Staphylococcus aureus)   . Trichomonas   . VAGINITIS, BACTERIAL, RECURRENT 08/15/2007   Qualifier: Diagnosis of  By: Radene Ou MD, [place]       Patient Active Problem List   Diagnosis Date Noted  . Morbid obesity ([place]) 11/23/2019  . Acute respiratory failure with hypoxia ([place]) 11/06/2019  . Chronic kidney disease, stage 3b ([place]) 11/06/2019  . COPD with acute bronchitis ([place])   . Acute kidney failure ([place]) 08/18/2019  . Left ventricular hypertrophy 07/21/2019  . Educated about COVID-19 virus infection 07/21/2019  . Acute on chronic diastolic heart failure ([place])   . Hypoxia   . Dyspnea on exertion 05/09/2019  . Sleep apnea 05/09/2019  . Hyperglycemia 04/09/2019  . Snoring 03/13/2019  . Symptomatic anemia 08/17/2018  . Acute renal failure (ARF) ([place]) 08/17/2018  . Chronic cystitis 08/03/2018  . Diabetic neuropathy ([place]) 06/16/2017   . Non compliance w medication regimen 04/12/2017  . Family history of colon cancer in mother 11/17/2016  . Dyspareunia in female 11/11/2016  . Screen for colon cancer 11/11/2016  . History of ovarian cyst 11/11/2016  . Chronic hepatitis C without hepatic coma ([place]) 11/09/2016  . Hyperlipidemia 10/07/2016  . Insulin dependent type 2 diabetes mellitus ([place]) 01/29/2014  . Status post intraocular lens implant 11/08/2013  . PCO (posterior capsular opacification) 09/28/2013  . Pseudophakia 09/12/2013  . GERD (gastroesophageal reflux disease) 09/06/2013  . Hypertension 09/06/2013  . BREAST PAIN, BILATERAL 02/19/2010  . ANEMIA, IRON DEFICIENCY 10/03/2009  . LEUKOPENIA, MILD 09/19/2009  . Chest pain 09/19/2009  . LIPOMA 06/18/2009  . 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 TINEA PEDIS 05/07/2009  . SKIN LESION 05/07/2009  . COUGH 05/07/2009  . SUBSTANCE ABUSE, MULTIPLE 02/22/2007  . HCVD (hypertensive cardiovascular disease) 02/22/2007     Past Surgical History:  Procedure Laterality Date  . BREAST BIOPSY Left 2018  . CESAREAN SECTION     breech     Prior to Admission medications   Medication Sig Start Date End Date Taking? Authorizing Provider  amLODipine (NORVASC) 10 MG tablet Take 10 mg by mouth daily. 06/14/20   [provider]  aspirin EC 81 MG EC tablet Take 1 tablet (81 mg total) by mouth daily. Patient not taking: Reported on 05/28/2020 11/08/19   Fritzi Mandes, MD  atorvastatin (LIPITOR) 40 MG tablet Take 1 tablet (40 mg total)  by mouth daily. 12/05/19   [name], FNP  blood glucose meter kit and supplies KIT Dispense based on patient and insurance preference. Use up to four times daily as directed. (FOR ICD-9 250.00, 250.01). 04/12/19   Dustin Flock, MD  ferrous sulfate 325 (65 FE) MG tablet Take 1 tablet (325 mg total) by mouth 2 (two) times daily with a meal. 11/08/19   Fritzi Mandes, MD  furosemide (LASIX) 40 MG tablet Take 1 tablet (40 mg total) by mouth  2 (two) times daily. 12/05/19 12/04/20  [name], FNP  hydrALAZINE (APRESOLINE) 100 MG tablet Take 1 tablet (100 mg total) by mouth 3 (three) times daily for 90 doses. 06/03/20 07/03/20  [name], MD  hydrALAZINE (APRESOLINE) 25 MG tablet Take 25 mg by mouth 3 (three) times daily. 06/26/20   [provider]  ibuprofen (ADVIL) 800 MG tablet Take 800 mg by mouth every 8 (eight) hours as needed. 02/07/20   [provider]  insulin glargine (LANTUS SOLOSTAR) 100 UNIT/ML Solostar Pen Inject 60 Units into the skin at bedtime. 06/03/20   [name], MD  labetalol (NORMODYNE) 300 MG tablet Take 1 tablet (300 mg total) by mouth 2 (two) times daily. 06/03/20   [name], MD  liraglutide (VICTOZA) 18 MG/3ML SOPN Inject 1.8 mg into the skin daily. 06/03/20   [name], MD  Misc. Devices MISC Rollator with Paramedic  Dx- CHF; Duration-lifetime. Weight 241 lbs 09/04/19   [name], MD  penicillin v potassium (VEETID) 500 MG tablet Take 500 mg by mouth every 6 (six) hours. 02/07/20   [provider]  pregabalin (LYRICA) 75 MG capsule Take 1 capsule (75 mg total) by mouth 2 (two) times daily. 06/03/20   [name], MD  Vitamin D, Ergocalciferol, (DRISDOL) 1.25 MG (50000 UNIT) CAPS capsule Take 50,000 Units by mouth once a week. 05/06/20   [provider]     Allergies Lisinopril   Family History  Problem Relation Age of Onset  . Colon cancer Mother   . Liver disease Sister   . Other Neg Hx   . Breast cancer Neg Hx   . Esophageal cancer Neg Hx   . Rectal cancer Neg Hx     Social History Social History   Tobacco Use  . Smoking status: Never Smoker  . Smokeless tobacco: Never Used  Vaping Use  . Vaping Use: Never used  Substance Use Topics  . Alcohol use: No  . Drug use: No    Types: Cocaine, Marijuana    Comment: remote h/o cocaine and marijuana use    Review of Systems  Constitutional:   No fever or chills.   ENT:   No sore throat. No rhinorrhea. Cardiovascular:   No chest pain or syncope. Respiratory:   Positive shortness of breath and cough. Gastrointestinal:   Negative for abdominal pain, vomiting positive diarrhea.  Musculoskeletal:   Negative for focal pain or swelling All other systems reviewed and are negative except as documented above in ROS and HPI.  ____________________________________________   PHYSICAL EXAM:  VITAL SIGNS: ED Triage Vitals  Enc Vitals Group     BP 07/25/20 0903 (!) 161/74     Pulse Rate 07/25/20 0903 94     Resp 07/25/20 0903 18     Temp 07/25/20 0903 99.4 F (37.4 C)     Temp Source 07/25/20 0903 Oral     SpO2 07/25/20 0909 94 %     Weight 07/25/20 0906  248 lb (112.5 kg)     Height 07/25/20 0906 5' 4" (1.626 m)     Head Circumference --      Peak Flow --      Pain Score 07/25/20 0905 8     Pain Loc --      Pain Edu? --      Excl. in GC? --     Vital signs reviewed, nursing assessments reviewed.   Constitutional:   Alert and oriented.  Ill-appearing. Eyes:   Conjunctivae are normal. EOMI. PERRL. ENT      Head:   Normocephalic and atraumatic.      Nose:   Wearing a mask.      Mouth/Throat:   Wearing a mask.      Neck:   No meningismus. Full ROM. Hematological/Lymphatic/Immunilogical:   No cervical lymphadenopathy. Cardiovascular:   RRR. Symmetric bilateral radial and DP pulses.  No murmurs. Cap refill less than 2 seconds. Respiratory:   Normal respiratory effort without tachypnea/retractions.  Diffuse expiratory wheezing.  Symmetric air entry. Gastrointestinal:   Soft and nontender. Non distended. There is no CVA tenderness.  No rebound, rigidity, or guarding.  Musculoskeletal:   Normal range of motion in all extremities. No joint effusions.  No lower extremity tenderness.  No edema. Neurologic:   Normal speech and language.  Motor grossly intact. No acute focal neurologic deficits are appreciated.  Skin:    Skin is warm, dry and intact. No  rash noted.  No petechiae, purpura, or bullae.  ____________________________________________    LABS (pertinent positives/negatives) (all labs ordered are listed, but only abnormal results are displayed) Labs Reviewed  BASIC METABOLIC PANEL - Abnormal; Notable for the following components:      Result Value   Glucose, Bld 129 (*)    Creatinine, Ser 1.59 (*)    Calcium 8.3 (*)    GFR, Estimated 37 (*)    All other components within normal limits  CBC - Abnormal; Notable for the following components:   WBC 3.6 (*)    RBC 3.55 (*)    Hemoglobin 10.1 (*)    HCT 32.1 (*)    Platelets 135 (*)    All other components within normal limits  CBC WITH DIFFERENTIAL/PLATELET - Abnormal; Notable for the following components:   WBC 3.5 (*)    RBC 3.50 (*)    Hemoglobin 9.8 (*)    HCT 31.8 (*)    Platelets 132 (*)    All other components within normal limits  CBC WITH DIFFERENTIAL/PLATELET - Abnormal; Notable for the following components:   WBC 3.6 (*)    RBC 3.49 (*)    Hemoglobin 9.8 (*)    HCT 31.6 (*)    Platelets 133 (*)    All other components within normal limits  POC SARS CORONAVIRUS 2 AG -  ED - Abnormal; Notable for the following components:   SARS Coronavirus 2 Ag POSITIVE (*)    All other components within normal limits  DIFFERENTIAL  TROPONIN I (HIGH SENSITIVITY)  TROPONIN I (HIGH SENSITIVITY)   ____________________________________________   EKG  Interpreted by me Sinus rhythm rate of 96, normal axis, normal intervals.  Right bundle branch block.  Normal QRS ST segments and T waves.  No ischemic changes.  3 PVCs on the strip.  ____________________________________________    RADIOLOGY  DG Chest 2 View  Result Date: 07/25/2020 CLINICAL DATA:  Patient reports mid chest pain, SOB, productive cough with yellow mucous, and chills x 5 days. Patient   was placed on 2L of oxygen in triage. Hx of HTN, CHF, and diabetes. EXAM: CHEST - 2 VIEW COMPARISON:  11/05/2019. FINDINGS:  Cardiac silhouette is top-normal in size. No mediastinal or hilar masses. No evidence of adenopathy. Clear lungs.  No pleural effusion or pneumothorax. Skeletal structures are intact. IMPRESSION: No active cardiopulmonary disease. Electronically Signed   By: David  Ormond M.D.   On: 07/25/2020 09:42    ____________________________________________   PROCEDURES .Critical Care Performed by: Stafford, Phillip, MD Authorized by: Stafford, Phillip, MD   Critical care provider statement:    Critical care time (minutes):  33   Critical care time was exclusive of:  Separately billable procedures and treating other patients   Critical care was necessary to treat or prevent imminent or life-threatening deterioration of the following conditions:  Respiratory failure   Critical care was time spent personally by me on the following activities:  Development of treatment plan with patient or surrogate, discussions with consultants, evaluation of patient's response to treatment, examination of patient, obtaining history from patient or surrogate, ordering and performing treatments and interventions, ordering and review of laboratory studies, ordering and review of radiographic studies, pulse oximetry, re-evaluation of patient's condition and review of old charts    ____________________________________________  DIFFERENTIAL DIAGNOSIS   Viral syndrome, COVID-19 infection, pulmonary edema, pleural effusion, pneumonia.  Doubt ACS PE or dissection  CLINICAL IMPRESSION / ASSESSMENT AND PLAN / ED COURSE  Medications ordered in the ED: Medications  remdesivir 200 mg in sodium chloride 0.9% 250 mL IVPB (200 mg Intravenous New Bag/Given 07/25/20 1222)    Followed by  remdesivir 100 mg in sodium chloride 0.9 % 100 mL IVPB (has no administration in time range)  dexamethasone (DECADRON) injection 10 mg (10 mg Intravenous Given 07/25/20 1128)  sodium chloride 0.9 % bolus 500 mL (500 mLs Intravenous New Bag/Given  07/25/20 1129)    Pertinent labs & imaging results that were available during my care of the patient were reviewed by me and considered in my medical decision making (see chart for details).  [name] [name] was evaluated in Emergency Department on 07/25/2020 for the symptoms described in the history of present illness. She was evaluated in the context of the global COVID-19 pandemic, which necessitated consideration that the patient might be at risk for infection with the SARS-CoV-2 virus that causes COVID-19. Institutional protocols and algorithms that pertain to the evaluation of patients at risk for COVID-19 are in a state of rapid change based on information released by regulatory bodies including the CDC and federal and state organizations. These policies and algorithms were followed during the patient's care in the ED.   Patient presents with multiple symptoms highly suspicious for COVID infection/viral syndrome.  Chest x-ray viewed and interpreted by me, appears normal.  Radiology report agrees with these findings.  She is hypoxic to 86% on room air, improved to 94% on 2 L nasal cannula.  She is severely symptomatic.  Will order Decadron for wheezing and plan to admit for hypoxia.  Clinical Course as of 07/25/20 1233  Fri Jul 25, 2020  1136 Covid positive. Will order remdesivir [PS]    Clinical Course User Index [PS] Stafford, Phillip, MD     ----------------------------------------- 12:36 PM on 07/25/2020 -----------------------------------------  Case discussed with hospitalist  ____________________________________________   FINAL CLINICAL IMPRESSION(S) / ED DIAGNOSES    Final diagnoses:  COVID-19 virus infection  Acute respiratory failure with hypoxia (HCC)  Type 2 diabetes mellitus without complication, with long-term current use   of insulin ([place])  Morbid obesity [facility])     ED Discharge Orders    None      Portions of this note were generated with dragon dictation  software. Dictation errors may occur despite best attempts at proofreading.   Carrie Mew, MD 07/25/20 1236

## 2020-07-25 NOTE — ED Triage Notes (Signed)
C/O cough, chest pain, SOB since Saturday.  AAOx3.  Skin warm and dry.  Expiratory wheezing audible in triage.

## 2020-07-25 NOTE — ED Triage Notes (Signed)
Placed on 2L/ [place] in triage.  Sats improved to 94%

## 2020-07-26 DIAGNOSIS — J9601 Acute respiratory failure with hypoxia: Secondary | ICD-10-CM

## 2020-07-26 DIAGNOSIS — I10 Essential (primary) hypertension: Secondary | ICD-10-CM | POA: Diagnosis not present

## 2020-07-26 DIAGNOSIS — N1832 Chronic kidney disease, stage 3b: Secondary | ICD-10-CM | POA: Diagnosis not present

## 2020-07-26 DIAGNOSIS — B182 Chronic viral hepatitis C: Secondary | ICD-10-CM | POA: Diagnosis not present

## 2020-07-26 DIAGNOSIS — U071 COVID-19: Secondary | ICD-10-CM | POA: Diagnosis not present

## 2020-07-26 LAB — COMPREHENSIVE METABOLIC PANEL
ALT: 10 U/L (ref 0–44)
AST: 12 U/L — ABNORMAL LOW (ref 15–41)
Albumin: 2.9 g/dL — ABNORMAL LOW (ref 3.5–5.0)
Alkaline Phosphatase: 54 U/L (ref 38–126)
Anion gap: 8 (ref 5–15)
BUN: 21 mg/dL — ABNORMAL HIGH (ref 6–20)
CO2: 28 mmol/L (ref 22–32)
Calcium: 8.6 mg/dL — ABNORMAL LOW (ref 8.9–10.3)
Chloride: 105 mmol/L (ref 98–111)
Creatinine, Ser: 1.4 mg/dL — ABNORMAL HIGH (ref 0.44–1.00)
GFR, Estimated: 43 mL/min — ABNORMAL LOW (ref >60)
Glucose, Bld: 177 mg/dL — ABNORMAL HIGH (ref 70–99)
Potassium: 4.2 mmol/L (ref 3.5–5.1)
Sodium: 141 mmol/L (ref 135–145)
Total Bilirubin: 0.6 mg/dL (ref 0.3–1.2)
Total Protein: 6.6 g/dL (ref 6.5–8.1)

## 2020-07-26 LAB — CBC WITH DIFFERENTIAL/PLATELET
Abs Immature Granulocytes: 0.02 10*3/uL (ref 0.00–0.07)
Basophils Absolute: 0 10*3/uL (ref 0.0–0.1)
Basophils Relative: 0 %
Eosinophils Absolute: 0 10*3/uL (ref 0.0–0.5)
Eosinophils Relative: 0 %
HCT: 31.1 % — ABNORMAL LOW (ref 36.0–46.0)
Hemoglobin: 10.1 g/dL — ABNORMAL LOW (ref 12.0–15.0)
Immature Granulocytes: 1 %
Lymphocytes Relative: 23 %
Lymphs Abs: 0.7 10*3/uL (ref 0.7–4.0)
MCH: 28.8 pg (ref 26.0–34.0)
MCHC: 32.5 g/dL (ref 30.0–36.0)
MCV: 88.6 fL (ref 80.0–100.0)
Monocytes Absolute: 0.3 10*3/uL (ref 0.1–1.0)
Monocytes Relative: 9 %
Neutro Abs: 2.2 10*3/uL (ref 1.7–7.7)
Neutrophils Relative %: 67 %
Platelets: 139 10*3/uL — ABNORMAL LOW (ref 150–400)
RBC: 3.51 MIL/uL — ABNORMAL LOW (ref 3.87–5.11)
RDW: 13.6 % (ref 11.5–15.5)
WBC: 3.3 10*3/uL — ABNORMAL LOW (ref 4.0–10.5)
nRBC: 0 % (ref 0.0–0.2)

## 2020-07-26 LAB — FIBRINOGEN: Fibrinogen: 521 mg/dL — ABNORMAL HIGH (ref 210–475)

## 2020-07-26 LAB — CBG MONITORING, ED
Glucose-Capillary: 137 mg/dL — ABNORMAL HIGH (ref 70–99)
Glucose-Capillary: 177 mg/dL — ABNORMAL HIGH (ref 70–99)
Glucose-Capillary: 283 mg/dL — ABNORMAL HIGH (ref 70–99)
Glucose-Capillary: 220 mg/dL — ABNORMAL HIGH (ref 70–99)

## 2020-07-26 LAB — D-DIMER, QUANTITATIVE: D-Dimer, Quant: 0.3 ug/mL-FEU (ref 0.00–0.50)

## 2020-07-26 LAB — MAGNESIUM: Magnesium: 1.9 mg/dL (ref 1.7–2.4)

## 2020-07-26 LAB — C-REACTIVE PROTEIN: CRP: 1.5 mg/dL — ABNORMAL HIGH (ref <1.0)

## 2020-07-26 LAB — FERRITIN: Ferritin: 254 ng/mL (ref 11–307)

## 2020-07-26 LAB — PHOSPHORUS: Phosphorus: 3.1 mg/dL (ref 2.5–4.6)

## 2020-07-26 MED ORDER — HYDRALAZINE HCL 50 MG PO TABS
50.0000 mg | ORAL_TABLET | Freq: Three times a day (TID) | ORAL | Status: DC
  Administered 2020-07-26 – 2020-07-27 (×2): 50 mg via ORAL
  Filled 2020-07-26 (×2): qty 1

## 2020-07-26 NOTE — ED Notes (Signed)
Msg sent to pharmacy re: missing dose of dexamethasone 6mg . Pt eating food sent by family.

## 2020-07-26 NOTE — ED Notes (Signed)
Oxygen titrated down to 1L per provider request. SPO2 95% on 1L, down from 3L.  Pt ambulated to bathroom with steady gait. SPO2 remained 94-95% on RA when pt returned to stretcher.

## 2020-07-26 NOTE — ED Notes (Signed)
Pt was sitting next to bed in bench. Pt now back in bed resting.

## 2020-07-26 NOTE — ED Notes (Signed)
Pt given phone to speak with family. Pt crying, stating that it's difficult to be in hall stretcher. Pt complaining because food is cold. Kitchen called, will send new hot tray of food.

## 2020-07-26 NOTE — ED Notes (Signed)
Pt lying awake in bed; a&ox4.  RR even and unlabored on 2L O2 via [place].  Continuous pulse ox and cardiac monitoring maintained -- O2 sats 97% on pulse ox and NSR HR 81 on cardiac monitor - abdomen round, soft, nontender.  Skin warm dry and intact.  Will monitor for acute changes and maintain plan of care.

## 2020-07-26 NOTE — ED Notes (Signed)
Pt in room watching TV.

## 2020-07-26 NOTE — Progress Notes (Signed)
PROGRESS NOTE    Eran Windish  OYD:[id number] DOB: 05-14-1961 DOA: 07/25/2020 PCP: [name], MD    Brief Narrative:  [name] [name] is a 60 y.o. female with medical history significant for morbid obesity, distant hx cocaine abuse, insulin-dependent dm, ckd 3, hcv treated, osa not on cpap, hfpef on lasix, htn, and anemia, who presents with malaise , sob, and hypoxia, 02 86% on RA improved with 2L [place]. Covid +.    Consultants:     Procedures:   Antimicrobials:   Remdisivir    Subjective: Feels weak, malaise, some sob. No cp  Objective: Vitals:   07/25/20 2107 07/25/20 2307 07/26/20 0356 07/26/20 0625  BP:  (!) 162/90 (!) 174/93 (!) 165/86  Pulse:  88 78 70  Resp:  20 20 20   Temp:      TempSrc:      SpO2: 96% 98% 100% 100%  Weight:      Height:        Intake/Output Summary (Last 24 hours) at 07/26/2020 0849 Last data filed at 07/25/2020 1300 Gross per 24 hour  Intake 750 ml  Output --  Net 750 ml   Filed Weights   07/25/20 0906  Weight: 112.5 kg    Examination:  General exam: Appears calm and comfortable  Respiratory system:few scattered crackles at bases, no wheezing Cardiovascular system: S1 & S2 heard, RRR. No JVD, murmurs, rubs, gallops or clicks.  Gastrointestinal system: Abdomen is nondistended, soft and nontender.  Normal bowel sounds heard. Central nervous system: Alert and oriented. Grossly intact Extremities: no edema Skin: warm, dry Psychiatry: Judgement and insight appear normal. Mood & affect appropriate.     Data Reviewed: I have personally reviewed following labs and imaging studies  CBC: Recent Labs  Lab 07/25/20 0914 07/26/20 0524  WBC 3.6*  3.5*  3.6* 3.3*  NEUTROABS 2.2  THIS TEST WAS ORDERED IN ERROR AND HAS BEEN CREDITED. 2.2  HGB 9.8*  9.8*  10.1* 10.1*  HCT 31.6*  31.8*  32.1* 31.1*  MCV 90.5  90.9  90.4 88.6  PLT 133*  132*  135* 672*   Basic Metabolic Panel: Recent Labs  Lab 07/25/20 0914  07/26/20 0524  NA 143 141  K 3.7 4.2  CL 105 105  CO2 29 28  GLUCOSE 129* 177*  BUN 18 21*  CREATININE 1.59* 1.40*  CALCIUM 8.3* 8.6*  MG  --  1.9  PHOS  --  3.1   GFR: Estimated Creatinine Clearance: 53.1 mL/min (A) (by C-G formula based on SCr of 1.4 mg/dL (H)). Liver Function Tests: Recent Labs  Lab 07/26/20 0524  AST 12*  ALT 10  ALKPHOS 54  BILITOT 0.6  PROT 6.6  ALBUMIN 2.9*   No results for input(s): LIPASE, AMYLASE in the last 168 hours. No results for input(s): AMMONIA in the last 168 hours. Coagulation Profile: No results for input(s): INR, PROTIME in the last 168 hours. Cardiac Enzymes: No results for input(s): CKTOTAL, CKMB, CKMBINDEX, TROPONINI in the last 168 hours. BNP (last 3 results) No results for input(s): PROBNP in the last 8760 hours. HbA1C: No results for input(s): HGBA1C in the last 72 hours. CBG: Recent Labs  Lab 07/25/20 2228 07/26/20 0746  GLUCAP 259* 137*   Lipid Profile: No results for input(s): CHOL, HDL, LDLCALC, TRIG, CHOLHDL, LDLDIRECT in the last 72 hours. Thyroid Function Tests: No results for input(s): TSH, T4TOTAL, FREET4, T3FREE, THYROIDAB in the last 72 hours. Anemia Panel: Recent Labs    07/26/20 0524  FERRITIN 254   Sepsis Labs: No results for input(s): PROCALCITON, LATICACIDVEN in the last 168 hours.  No results found for this or any previous visit (from the past 240 hour(s)).       Radiology Studies: DG Chest 2 View  Result Date: 07/25/2020 CLINICAL DATA:  Patient reports mid chest pain, SOB, productive cough with yellow mucous, and chills x 5 days. Patient was placed on 2L of oxygen in triage. Hx of HTN, CHF, and diabetes. EXAM: CHEST - 2 VIEW COMPARISON:  11/05/2019. FINDINGS: Cardiac silhouette is top-normal in size. No mediastinal or hilar masses. No evidence of adenopathy. Clear lungs.  No pleural effusion or pneumothorax. Skeletal structures are intact. IMPRESSION: No active cardiopulmonary disease.  Electronically Signed   By: Lajean Manes M.D.   On: 07/25/2020 09:42        Scheduled Meds: . amLODipine  10 mg Oral Daily  . atorvastatin  40 mg Oral Daily  . dexamethasone  6 mg Oral Q24H  . enoxaparin (LOVENOX) injection  0.5 mg/kg Subcutaneous Q24H  . furosemide  40 mg Oral BID  . hydrALAZINE  25 mg Oral TID  . insulin aspart  0-15 Units Subcutaneous TID WC  . insulin glargine  60 Units Subcutaneous QHS  . pregabalin  75 mg Oral BID  . sodium chloride flush  3 mL Intravenous Q12H   Continuous Infusions: . sodium chloride    . remdesivir 100 mg in NS 100 mL      Assessment & Plan:   Active Problems:   Insulin dependent type 2 diabetes mellitus (HCC)   Chronic hepatitis C without hepatic coma (HCC)   Sleep apnea   Hypoxia   Chronic kidney disease, stage 3b (HCC)   Hypertension   Acute respiratory disease due to COVID-19 virus   Acute hypoxemic respiratory failure due to COVID-19 [facility])   # Covid infection # Acute hypoxic respiratory failure cxr negative but pt hypoxic. Fibrin nml. Ct scan not necessary at this time. Continue following inflammatory markers continue steroid and Remdesivir    # T2DM BG stable Continue Lantus Hold home victoza RISS  # HFpEF Compensated, without acute exacerbation Continue Lasix  # HTN Elevated Will increase hydralazine to 50 mg 3 times daily, continue amlodipine   # HCV # Thrombocytopenia Treated HCV, unclear if has had f/u labs to confirm erradication. Hx borderline low platelets - monitor  # OSA Not on cpap at home  # HFpEF Compensated - cont home lasix 40 bid  # CKD3 Kidney function @ baseline - monitor  # Anemia H 10.1, stable from baseline. W/u in 2020 included egd/colonoscopy that were normal - monitor   # Neuropathy - cont home lyrica   DVT prophylaxis: Lovenox Code Status: Full Family Communication: larry friend updated  Status is: Inpatient  Remains inpatient appropriate  because:Inpatient level of care appropriate due to severity of illness   Dispo: The patient is from: Home              Anticipated d/c is to: Home              Anticipated d/c date is: 1-2 days              Patient currently is not medically stable to d/c.            LOS: 1 day   Time spent: 35 min with >50% on coc    Nolberto Hanlon, MD [facility] Pager [phone]  If 7PM-7AM, please  contact night-coverage 07/26/2020, 8:49 AM

## 2020-07-27 DIAGNOSIS — N1832 Chronic kidney disease, stage 3b: Secondary | ICD-10-CM

## 2020-07-27 DIAGNOSIS — U071 COVID-19: Secondary | ICD-10-CM | POA: Diagnosis not present

## 2020-07-27 DIAGNOSIS — J069 Acute upper respiratory infection, unspecified: Secondary | ICD-10-CM

## 2020-07-27 DIAGNOSIS — I10 Essential (primary) hypertension: Secondary | ICD-10-CM

## 2020-07-27 DIAGNOSIS — E669 Obesity, unspecified: Secondary | ICD-10-CM | POA: Diagnosis not present

## 2020-07-27 DIAGNOSIS — G4730 Sleep apnea, unspecified: Secondary | ICD-10-CM

## 2020-07-27 DIAGNOSIS — B182 Chronic viral hepatitis C: Secondary | ICD-10-CM | POA: Diagnosis not present

## 2020-07-27 LAB — COMPREHENSIVE METABOLIC PANEL
ALT: 10 U/L (ref 0–44)
AST: 13 U/L — ABNORMAL LOW (ref 15–41)
Albumin: 3.1 g/dL — ABNORMAL LOW (ref 3.5–5.0)
Alkaline Phosphatase: 55 U/L (ref 38–126)
Anion gap: 8 (ref 5–15)
BUN: 28 mg/dL — ABNORMAL HIGH (ref 6–20)
CO2: 28 mmol/L (ref 22–32)
Calcium: 8.7 mg/dL — ABNORMAL LOW (ref 8.9–10.3)
Chloride: 102 mmol/L (ref 98–111)
Creatinine, Ser: 1.37 mg/dL — ABNORMAL HIGH (ref 0.44–1.00)
GFR, Estimated: 44 mL/min — ABNORMAL LOW (ref >60)
Glucose, Bld: 227 mg/dL — ABNORMAL HIGH (ref 70–99)
Potassium: 4.5 mmol/L (ref 3.5–5.1)
Sodium: 138 mmol/L (ref 135–145)
Total Bilirubin: 0.6 mg/dL (ref 0.3–1.2)
Total Protein: 7.1 g/dL (ref 6.5–8.1)

## 2020-07-27 LAB — PHOSPHORUS: Phosphorus: 3.5 mg/dL (ref 2.5–4.6)

## 2020-07-27 LAB — CBC WITH DIFFERENTIAL/PLATELET
Abs Immature Granulocytes: 0 10*3/uL (ref 0.00–0.07)
Basophils Absolute: 0 10*3/uL (ref 0.0–0.1)
Basophils Relative: 0 %
Eosinophils Absolute: 0 10*3/uL (ref 0.0–0.5)
Eosinophils Relative: 0 %
HCT: 31.2 % — ABNORMAL LOW (ref 36.0–46.0)
Hemoglobin: 9.9 g/dL — ABNORMAL LOW (ref 12.0–15.0)
Immature Granulocytes: 0 %
Lymphocytes Relative: 21 %
Lymphs Abs: 0.7 10*3/uL (ref 0.7–4.0)
MCH: 27.8 pg (ref 26.0–34.0)
MCHC: 31.7 g/dL (ref 30.0–36.0)
MCV: 87.6 fL (ref 80.0–100.0)
Monocytes Absolute: 0.1 10*3/uL (ref 0.1–1.0)
Monocytes Relative: 4 %
Neutro Abs: 2.6 10*3/uL (ref 1.7–7.7)
Neutrophils Relative %: 75 %
Platelets: 154 10*3/uL (ref 150–400)
RBC: 3.56 MIL/uL — ABNORMAL LOW (ref 3.87–5.11)
RDW: 13.3 % (ref 11.5–15.5)
WBC: 3.5 10*3/uL — ABNORMAL LOW (ref 4.0–10.5)
nRBC: 0 % (ref 0.0–0.2)

## 2020-07-27 LAB — D-DIMER, QUANTITATIVE: D-Dimer, Quant: <0.27 ug/mL-FEU (ref 0.00–0.50)

## 2020-07-27 LAB — FIBRINOGEN: Fibrinogen: 452 mg/dL (ref 210–475)

## 2020-07-27 LAB — FERRITIN: Ferritin: 272 ng/mL (ref 11–307)

## 2020-07-27 LAB — MAGNESIUM: Magnesium: 2 mg/dL (ref 1.7–2.4)

## 2020-07-27 LAB — C-REACTIVE PROTEIN: CRP: 0.8 mg/dL (ref <1.0)

## 2020-07-27 MED ORDER — ALBUTEROL SULFATE HFA 108 (90 BASE) MCG/ACT IN AERS
1.0000 | INHALATION_SPRAY | RESPIRATORY_TRACT | Status: DC | PRN
  Filled 2020-07-27: qty 6.7

## 2020-07-27 MED ORDER — GUAIFENESIN-DM 100-10 MG/5ML PO SYRP: 5.0000 mL | ORAL_SOLUTION | Freq: Four times a day (QID) | ORAL | 0 refills | Status: DC | PRN

## 2020-07-27 MED ORDER — ALBUTEROL SULFATE HFA 108 (90 BASE) MCG/ACT IN AERS: 1.0000 | INHALATION_SPRAY | RESPIRATORY_TRACT | 0 refills | Status: DC | PRN

## 2020-07-27 MED ORDER — GUAIFENESIN-DM 100-10 MG/5ML PO SYRP: 5.0000 mL | ORAL_SOLUTION | ORAL | Status: DC | PRN

## 2020-07-27 NOTE — Discharge Instructions (Signed)
10 Things You Can Do to Manage Your COVID-19 Symptoms at Home If you have possible or confirmed COVID-19: 1. Stay home from work and school. And stay away from other public places. If you must go out, avoid using any kind of public transportation, ridesharing, or taxis. 2. Monitor your symptoms carefully. If your symptoms get worse, call your healthcare provider immediately. 3. Get rest and stay hydrated. 4. If you have a medical appointment, call the healthcare provider ahead of time and tell them that you have or may have COVID-19. 5. For medical emergencies, call 911 and notify the dispatch personnel that you have or may have COVID-19. 6. Cover your cough and sneezes with a tissue or use the inside of your elbow. 7. Wash your hands often with soap and water for at least 20 seconds or clean your hands with an alcohol-based hand sanitizer that contains at least 60% alcohol. 8. As much as possible, stay in a specific room and away from other people in your home. Also, you should use a separate bathroom, if available. If you need to be around other people in or outside of the home, wear a mask. 9. Avoid sharing personal items with other people in your household, like dishes, towels, and bedding. 10. Clean all surfaces that are touched often, like counters, tabletops, and doorknobs. Use household cleaning sprays or wipes according to the label instructions. cdc.gov/coronavirus 01/17/2019 This information is not intended to replace advice given to you by your health care provider. Make sure you discuss any questions you have with your health care provider. Document Revised: 06/21/2019 Document Reviewed: 06/21/2019 Elsevier Patient Education  2020 Elsevier Inc.  

## 2020-07-27 NOTE — Discharge Summary (Signed)
Physician Discharge Summary  [name] QPY:[id number] DOB: 1960/11/07 DOA: 07/25/2020  PCP: [name], MD  Admit date: 07/25/2020 Discharge date: 07/27/2020  Admitted From: Home  Disposition:  Home   Recommendations for Outpatient Follow-up and new medication changes:  1. Follow up with Dr. Charlane Ferretti in 2 weeks.  2. Continue self quarantine for 10 days, use a mask in public and maintain physical distancing.   Home Health: no   Equipment/Devices: no    Discharge Condition: stable  CODE STATUS: full  Diet recommendation: hearth healthy and diabetic prudent.   Brief/Interim Summary: [name] was admitted to the hospital with working diagnosis of SARS COVID-19 viral infection.  60 year old female past medical history for obesity class III, type 2 diabetes mellitus, chronic kidney disease stage III, hepatitis C, obstructive sleep apnea, diastolic heart failure, hypertension, and chronic anemia who presented with malaise. She had 6 days of nasal congestion, body aches, headache, malaise and dyspnea.  Her initial oximetry was 86% on room air that improved with 2 L per nasal cannula supplemental oxygen.  Her blood pressure was 159/90, heart rate 90, respiratory rate 17, her lungs had few scattered rhonchi, heart S1-S2, present, rhythmic, abdomen soft, protuberant, no lower extremity edema.  Sodium 143, potassium 3.7, chloride 105, bicarb 29, glucose 129, BUN 18, creatinine 1.59, troponin I 6, white count 3.6, hemoglobin 10.1, hematocrit 32.1, platelets 135.  SARS COVID-19 was positive.  Her chest radiograph had faint bibasilar atelectasis.  EKG 96 bpm, normal axis, normal intervals, sinus rhythm with PVCs, poor R wave progression, no ST segment or T wave changes.  1.  Acute SARS COVID-19 viral infection.  Patient had transient hypoxemia on admission the rapidly improved, her chest radiograph had no features of pneumonia but atelectasis. Pneumonia was ruled out.  Patient is high  risk of worsening COVID-19 infection due to significant risk factors.  She did receive 3 doses of intravenous remdesivir.  COVID-19 Labs  Recent Labs    07/26/20 0524 07/27/20 0134  DDIMER 0.30 <0.27  FERRITIN 254 272  CRP 1.5*  --     Lab Results  Component Value Date   SARSCOV2NAA Comment 07/04/2020   SARSCOV2NAA NEGATIVE 11/06/2019   [place] NEGATIVE 08/18/2019   [place] Not Detected 08/02/2019   Her inflammatory markers were low.  Her oxygenation discharge was 98 to 95% on room air. Patient will follow-up as an outpatient within 2 weeks.  She had 2 doses of COVID-19 vaccine in the past, it is recommended her to get her booster dose after recovering from this infection.  2.  Type 2 diabetes mellitus.  Uncontrolled with hyperglycemia.  Diabetic neuropathy.  Dyslipidemia.  Patient was placed on insulin, basal and sliding scale. Continue statin therapy. Patient on Lyrica.  3.  Diastolic heart failure/hypertension.  No signs of acute decompensation, continue blood pressure control (amlodipine, labetoalol and hydralazine) and diuretic therapy.  4.  Obesity class III/obstructive sleep apnea.  Calculated BMI is 42.5, follow-up as an outpatient, currently she has no CPAP.  5.  Chronic kidney disease stage IIIb/anemia chronic renal disease.  Her kidney function remained stable creatinine 1.37, hemoglobin 9.9 at discharge. Close follow-up as an outpatient.  6.  Hepatitis C.  Treated as an outpatient.  Discharge Diagnoses:  Principal Problem:   Acute respiratory disease due to COVID-19 virus Active Problems:   Insulin dependent type 2 diabetes mellitus (HCC)   Chronic hepatitis C without hepatic coma (HCC)   Sleep apnea   Chronic kidney disease, stage 3b ([place])  Hypertension   Class 3 obesity    Discharge Instructions  Discharge Instructions    Diet - low sodium heart healthy   Complete by: As directed    Discharge instructions   Complete by: As directed     Continue quarantine for 10 more days, use a mask in public and maintain physical distancing.   Increase activity slowly   Complete by: As directed      Allergies as of 07/27/2020      Reactions   Lisinopril Swelling      Medication List    TAKE these medications   albuterol 108 (90 Base) MCG/ACT inhaler Commonly known as: VENTOLIN HFA Inhale 1 puff into the lungs every 4 (four) hours as needed for wheezing or shortness of breath.   amLODipine 10 MG tablet Commonly known as: NORVASC Take 10 mg by mouth daily.   atorvastatin 40 MG tablet Commonly known as: LIPITOR Take 1 tablet (40 mg total) by mouth daily.   blood glucose meter kit and supplies Kit Dispense based on patient and insurance preference. Use up to four times daily as directed. (FOR ICD-9 250.00, 250.01).   furosemide 40 MG tablet Commonly known as: LASIX Take 1 tablet (40 mg total) by mouth 2 (two) times daily.   guaiFENesin-dextromethorphan 100-10 MG/5ML syrup Commonly known as: ROBITUSSIN DM Take 5 mLs by mouth every 6 (six) hours as needed for cough.   hydrALAZINE 25 MG tablet Commonly known as: APRESOLINE Take 25 mg by mouth 3 (three) times daily.   labetalol 300 MG tablet Commonly known as: NORMODYNE Take 1 tablet (300 mg total) by mouth 2 (two) times daily.   Lantus SoloStar 100 UNIT/ML Solostar Pen Generic drug: insulin glargine Inject 60 Units into the skin at bedtime.   Misc. Devices Environmental health practitioner  Dx- CHF; Duration-lifetime. Weight 241 lbs   pregabalin 75 MG capsule Commonly known as: Lyrica Take 1 capsule (75 mg total) by mouth 2 (two) times daily.   Victoza 18 MG/3ML Sopn Generic drug: liraglutide Inject 1.8 mg into the skin daily.       Allergies  Allergen Reactions  . Lisinopril Swelling        Procedures/Studies: DG Chest 2 View  Result Date: 07/25/2020 CLINICAL DATA:  Patient reports mid chest pain, SOB, productive cough with yellow mucous, and  chills x 5 days. Patient was placed on 2L of oxygen in triage. Hx of HTN, CHF, and diabetes. EXAM: CHEST - 2 VIEW COMPARISON:  11/05/2019. FINDINGS: Cardiac silhouette is top-normal in size. No mediastinal or hilar masses. No evidence of adenopathy. Clear lungs.  No pleural effusion or pneumothorax. Skeletal structures are intact. IMPRESSION: No active cardiopulmonary disease. Electronically Signed   By: Lajean Manes M.D.   On: 07/25/2020 09:42       Subjective: Patient is feeling better, no nausea or vomiting, dyspnea continue to improve, she has been ambulating on room air.   Discharge Exam: Vitals:   07/27/20 0550 07/27/20 0800  BP: (!) 182/89 (!) 163/89  Pulse: 77 69  Resp: 15 13  Temp: 97.7 F (36.5 C)   SpO2: 97% 98%   Vitals:   07/26/20 2212 07/27/20 0200 07/27/20 0550 07/27/20 0800  BP: (!) 171/98 (!) 174/89 (!) 182/89 (!) 163/89  Pulse: 82 71 77 69  Resp: '20 20 15 13  ' Temp: 98.1 F (36.7 C)  97.7 F (36.5 C)   TempSrc: Oral  Oral   SpO2: 94% 99% 97% 98%  Weight:      Height:        General: Not in pain or dyspnea.  Neurology: Awake and alert, non focal  E ENT: no pallor, no icterus, oral mucosa moist Cardiovascular: No JVD. S1-S2 present, rhythmic, no gallops, rubs, or murmurs. Non pitting trace bilateral lower extremity edema. Pulmonary: positive breath sounds bilaterally, Gastrointestinal. Abdomen soft and non tender Skin. No rashes Musculoskeletal: no joint deformities   The results of significant diagnostics from this hospitalization (including imaging, microbiology, ancillary and laboratory) are listed below for reference.     Microbiology: No results found for this or any previous visit (from the past 240 hour(s)).   Labs: BNP (last 3 results) Recent Labs    08/17/19 2055 11/05/19 1851 05/27/20 1219  BNP 40.0 173.0* 95.1   Basic Metabolic Panel: Recent Labs  Lab 07/25/20 0914 07/26/20 0524 07/27/20 0134  NA 143 141 138  K 3.7 4.2 4.5   CL 105 105 102  CO2 '29 28 28  ' GLUCOSE 129* 177* 227*  BUN 18 21* 28*  CREATININE 1.59* 1.40* 1.37*  CALCIUM 8.3* 8.6* 8.7*  MG  --  1.9 2.0  PHOS  --  3.1 3.5   Liver Function Tests: Recent Labs  Lab 07/26/20 0524 07/27/20 0134  AST 12* 13*  ALT 10 10  ALKPHOS 54 55  BILITOT 0.6 0.6  PROT 6.6 7.1  ALBUMIN 2.9* 3.1*   No results for input(s): LIPASE, AMYLASE in the last 168 hours. No results for input(s): AMMONIA in the last 168 hours. CBC: Recent Labs  Lab 07/25/20 0914 07/26/20 0524 07/27/20 0134  WBC 3.6*  3.5*  3.6* 3.3* 3.5*  NEUTROABS 2.2  THIS TEST WAS ORDERED IN ERROR AND HAS BEEN CREDITED. 2.2 2.6  HGB 9.8*  9.8*  10.1* 10.1* 9.9*  HCT 31.6*  31.8*  32.1* 31.1* 31.2*  MCV 90.5  90.9  90.4 88.6 87.6  PLT 133*  132*  135* 139* 154   Cardiac Enzymes: No results for input(s): CKTOTAL, CKMB, CKMBINDEX, TROPONINI in the last 168 hours. BNP: Invalid input(s): POCBNP CBG: Recent Labs  Lab 07/25/20 2228 07/26/20 0746 07/26/20 1312 07/26/20 1724 07/26/20 2211  GLUCAP 259* 137* 177* 283* 220*   D-Dimer Recent Labs    07/26/20 0524 07/27/20 0134  DDIMER 0.30 <0.27   Hgb A1c No results for input(s): HGBA1C in the last 72 hours. Lipid Profile No results for input(s): CHOL, HDL, LDLCALC, TRIG, CHOLHDL, LDLDIRECT in the last 72 hours. Thyroid function studies No results for input(s): TSH, T4TOTAL, T3FREE, THYROIDAB in the last 72 hours.  Invalid input(s): FREET3 Anemia work up Recent Labs    07/26/20 0524 07/27/20 0134  FERRITIN 254 272   Urinalysis    Component Value Date/Time   COLORURINE STRAW (A) 04/09/2019 1615   APPEARANCEUR CLOUDY (A) 04/09/2019 1615   LABSPEC 1.010 04/09/2019 1615   PHURINE 5.0 04/09/2019 1615   GLUCOSEU >=500 (A) 04/09/2019 1615   HGBUR SMALL (A) 04/09/2019 1615   HGBUR negative 06/29/2010 1356   BILIRUBINUR NEGATIVE 04/09/2019 1615   KETONESUR NEGATIVE 04/09/2019 1615   PROTEINUR 100 (A) 04/09/2019  1615   UROBILINOGEN 1.0 01/26/2015 0925   NITRITE NEGATIVE 04/09/2019 1615   LEUKOCYTESUR SMALL (A) 04/09/2019 1615   Sepsis Labs Invalid input(s): PROCALCITONIN,  WBC,  LACTICIDVEN Microbiology No results found for this or any previous visit (from the past 240 hour(s)).   Time coordinating discharge: 45 minutes  SIGNED:   Tawni Millers, MD  [facility] 07/27/2020, 11:03 AM

## 2020-07-28 ENCOUNTER — Telehealth: Payer: Self-pay

## 2020-07-28 LAB — HEMOGLOBIN A1C
Hgb A1c MFr Bld: 6.7 % — ABNORMAL HIGH (ref 4.8–5.6)
Mean Plasma Glucose: 145.59 mg/dL

## 2020-07-28 NOTE — Telephone Encounter (Signed)
Transition Care Management Follow-up Telephone Call  Date of discharge and from where: 07/27/2020 [facility] ED  How have you been since you were released from the hospital? Feeling so much better.  Any questions or concerns? No  Items Reviewed:  Did the pt receive and understand the discharge instructions provided? Yes   Medications obtained and verified? Yes   Other? No   Any new allergies since your discharge? No   Dietary orders reviewed? Yes  Do you have support at home? Yes    Functional Questionnaire: (I = Independent and D = Dependent) ADLs: I  Bathing/Dressing- I  Meal Prep- I  Eating- I  Maintaining continence- I  Transferring/Ambulation- I  Managing Meds- I  Follow up appointments reviewed:   PCP Hospital f/u appt confirmed? No Patient stated she will call herself for an appointment.   [facility] f/u appt confirmed? No    Are transportation arrangements needed? No   If their condition worsens, is the pt aware to call PCP or go to the Emergency Dept.? Yes  Was the patient provided with contact information for the PCP's office or ED? Yes  Was to pt encouraged to call back with questions or concerns? Yes

## 2020-08-04 ENCOUNTER — Ambulatory Visit: Payer: Medicaid Other | Admitting: Podiatry

## 2020-08-11 ENCOUNTER — Other Ambulatory Visit: Payer: Self-pay

## 2020-08-11 NOTE — Patient Outreach (Signed)
Verbal consent obtained for [facility] enhanced pharmacy services (medication synchronization, adherence packaging, delivery coordination). A medication sync plan was created to allow patient to get all medications delivered once every 30 to 90 days per patient preference. Patient understands they have freedom to choose pharmacy and clinical pharmacist will coordinate care between all prescribers and [facility].   Organized medications. Will have first round delivered 08/19/20. Patient told to get Furosemide, Amlodipine, and Lantus from [facility] since she needs them today.

## 2020-08-12 ENCOUNTER — Other Ambulatory Visit: Payer: Self-pay | Admitting: Family Medicine

## 2020-08-12 NOTE — Telephone Encounter (Signed)
Requested medication (s) are due for refill today: historical med  Requested medication (s) are on the active medication list: yes   Last refill:  06/14/20  Future visit scheduled: no  Notes to clinic:  historical medication     Requested Prescriptions  Pending Prescriptions Disp Refills   amLODipine (NORVASC) 10 MG tablet [Pharmacy Med Name: amLODIPine Besylate 10 MG Oral Tablet] 30 tablet 0    Sig: Take 1 tablet by mouth once daily      Cardiovascular:  Calcium Channel Blockers Failed - 08/12/2020  4:11 PM      Failed - Last BP in normal range    BP Readings from Last 1 Encounters:  07/27/20 (!) 122/97          Passed - Valid encounter within last 6 months    Recent Outpatient Visits           2 months ago Type 2 diabetes mellitus with stage 3b chronic kidney disease, with long-term current use of insulin ([place])   [place], [place], MD   6 months ago Type 2 diabetes mellitus with stage 3b chronic kidney disease, with long-term current use of insulin ([place])   [facility] [place], [place], MD   8 months ago No-show for appointment   [place], Amy J, NP   11 months ago 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

## 2020-08-13 NOTE — Telephone Encounter (Signed)
Please fill if appropriate.  

## 2020-08-18 ENCOUNTER — Other Ambulatory Visit: Payer: Self-pay

## 2020-08-18 ENCOUNTER — Encounter: Payer: Self-pay | Admitting: Podiatry

## 2020-08-18 ENCOUNTER — Ambulatory Visit (INDEPENDENT_AMBULATORY_CARE_PROVIDER_SITE_OTHER): Payer: Medicaid Other | Admitting: Podiatry

## 2020-08-18 DIAGNOSIS — M79675 Pain in left toe(s): Secondary | ICD-10-CM | POA: Diagnosis not present

## 2020-08-18 DIAGNOSIS — E119 Type 2 diabetes mellitus without complications: Secondary | ICD-10-CM | POA: Diagnosis not present

## 2020-08-18 DIAGNOSIS — B353 Tinea pedis: Secondary | ICD-10-CM

## 2020-08-18 DIAGNOSIS — M79674 Pain in right toe(s): Secondary | ICD-10-CM

## 2020-08-18 DIAGNOSIS — B351 Tinea unguium: Secondary | ICD-10-CM | POA: Diagnosis not present

## 2020-08-18 DIAGNOSIS — E0842 Diabetes mellitus due to underlying condition with diabetic polyneuropathy: Secondary | ICD-10-CM | POA: Diagnosis not present

## 2020-08-18 DIAGNOSIS — Z794 Long term (current) use of insulin: Secondary | ICD-10-CM

## 2020-08-18 MED ORDER — KETOCONAZOLE 2 % EX CREA: 1.0000 "application " | TOPICAL_CREAM | Freq: Every day | CUTANEOUS | 2 refills | Status: DC

## 2020-08-18 NOTE — Progress Notes (Signed)
  Subjective:  Patient ID: [name] [name], female    DOB: Aug 20, 1960,  MRN: [id number]  Chief Complaint  Patient presents with  . routine foot care    Nail trim     60 y.o. female presents with the above complaint. History confirmed with patient.  We have not seen her for many years.  She does not know her A1c.  Complains of dry cracked skin.  Objective:  Physical Exam: warm, good capillary refill, no trophic changes or ulcerative lesions, normal DP and PT pulses and abnormal sensory exam with loss of protective sensation to monofilament.  Onychomycosis with elongated thickened toenail plates with subungual debris and yellow discoloration.  There is dry scaling skin on the plantar foot and in between the toes.  Assessment:   1. Tinea pedis of both feet   2. Onychomycosis   3. Pain due to onychomycosis of toenails of both feet   4. Diabetic mononeuropathy associated with diabetes mellitus due to underlying condition ([place])   5. Insulin dependent type 2 diabetes mellitus ([place])      Plan:  Patient was evaluated and treated and all questions answered.   Patient educated on diabetes. Discussed proper diabetic foot care and discussed risks and complications of disease. Educated patient in depth on reasons to return to the office immediately should he/she discover anything concerning or new on the feet. All questions answered. Discussed proper shoes as well.   Discussed the etiology and treatment options for the condition in detail with the patient. Educated patient on the topical and oral treatment options for mycotic nails. Recommended debridement of the nails today. Sharp and mechanical debridement performed of all painful and mycotic nails today. Nails debrided in length and thickness using a nail nipper to level of comfort. Discussed treatment options including appropriate shoe gear. Follow up as needed for painful nails.   Discussed the etiology and treatment options for tinea pedis.   Discussed topical and oral treatment.  Recommended topical treatment with 2% ketoconazole cream.  This was sent to the patient's pharmacy.  Also discussed appropriate foot hygiene, use of antifungal spray such as Tinactin in shoes, as well as cleaning her foot surfaces such as showers and bathroom floors with bleach.  Return in about 3 months (around 11/15/2020) for at risk diabetic foot care.

## 2020-08-18 NOTE — Patient Instructions (Signed)
Diabetes Mellitus and Foot Care Foot care is an important part of your health, especially when you have diabetes. Diabetes may cause you to have problems because of poor blood flow (circulation) to your feet and legs, which can cause your skin to:  Become thinner and drier.  Break more easily.  Heal more slowly.  Peel and crack. You may also have nerve damage (neuropathy) in your legs and feet, causing decreased feeling in them. This means that you may not notice minor injuries to your feet that could lead to more serious problems. Noticing and addressing any potential problems early is the best way to prevent future foot problems. How to care for your feet Foot hygiene  Wash your feet daily with warm water and mild soap. Do not use hot water. Then, pat your feet and the areas between your toes until they are completely dry. Do not soak your feet as this can dry your skin.  Trim your toenails straight across. Do not dig under them or around the cuticle. File the edges of your nails with an emery board or nail file.  Apply a moisturizing lotion or petroleum jelly to the skin on your feet and to dry, brittle toenails. Use lotion that does not contain alcohol and is unscented. Do not apply lotion between your toes.   Shoes and socks  Wear clean socks or stockings every day. Make sure they are not too tight. Do not wear knee-high stockings since they may decrease blood flow to your legs.  Wear shoes that fit properly and have enough cushioning. Always look in your shoes before you put them on to be sure there are no objects inside.  To break in new shoes, wear them for just a few hours a day. This prevents injuries on your feet. Wounds, scrapes, corns, and calluses  Check your feet daily for blisters, cuts, bruises, sores, and redness. If you cannot see the bottom of your feet, use a mirror or ask someone for help.  Do not cut corns or calluses or try to remove them with medicine.  If you  find a minor scrape, cut, or break in the skin on your feet, keep it and the skin around it clean and dry. You may clean these areas with mild soap and water. Do not clean the area with peroxide, alcohol, or iodine.  If you have a wound, scrape, corn, or callus on your foot, look at it several times a day to make sure it is healing and not infected. Check for: ? Redness, swelling, or pain. ? Fluid or blood. ? Warmth. ? Pus or a bad smell.   General tips  Do not cross your legs. This may decrease blood flow to your feet.  Do not use heating pads or hot water bottles on your feet. They may burn your skin. If you have lost feeling in your feet or legs, you may not know this is happening until it is too late.  Protect your feet from hot and cold by wearing shoes, such as at the beach or on hot pavement.  Schedule a complete foot exam at least once a year (annually) or more often if you have foot problems. Report any cuts, sores, or bruises to your health care provider immediately. Where to find more information  American Diabetes Association: www.diabetes.org  Association of Diabetes Care & Education Specialists: www.diabeteseducator.org Contact a health care provider if:  You have a medical condition that increases your risk of infection and   you have any cuts, sores, or bruises on your feet.  You have an injury that is not healing.  You have redness on your legs or feet.  You feel burning or tingling in your legs or feet.  You have pain or cramps in your legs and feet.  Your legs or feet are numb.  Your feet always feel cold.  You have pain around any toenails. Get help right away if:  You have a wound, scrape, corn, or callus on your foot and: ? You have pain, swelling, or redness that gets worse. ? You have fluid or blood coming from the wound, scrape, corn, or callus. ? Your wound, scrape, corn, or callus feels warm to the touch. ? You have pus or a bad smell coming from  the wound, scrape, corn, or callus. ? You have a fever. ? You have a red line going up your leg. Summary  Check your feet every day for blisters, cuts, bruises, sores, and redness.  Apply a moisturizing lotion or petroleum jelly to the skin on your feet and to dry, brittle toenails.  Wear shoes that fit properly and have enough cushioning.  If you have foot problems, report any cuts, sores, or bruises to your health care provider immediately.  Schedule a complete foot exam at least once a year (annually) or more often if you have foot problems. This information is not intended to replace advice given to you by your health care provider. Make sure you discuss any questions you have with your health care provider. Document Revised: 01/24/2020 Document Reviewed: 01/24/2020 Elsevier Patient Education  2021 Elsevier Inc.  

## 2020-08-19 ENCOUNTER — Telehealth: Payer: Self-pay | Admitting: Family Medicine

## 2020-08-19 DIAGNOSIS — I5032 Chronic diastolic (congestive) heart failure: Secondary | ICD-10-CM | POA: Diagnosis not present

## 2020-08-19 NOTE — Telephone Encounter (Signed)
Can you please verify the name of her pharmacy and update her med list accordingly?  I received a message from pharmacist stating [facility] now [facility] in [place]. Thanks.

## 2020-08-19 NOTE — Telephone Encounter (Signed)
Call placed to pt and VM was left informing pt to return phone call.

## 2020-08-28 ENCOUNTER — Telehealth: Payer: Self-pay | Admitting: Family

## 2020-08-28 ENCOUNTER — Ambulatory Visit: Payer: Medicaid Other | Admitting: Family

## 2020-08-28 NOTE — Progress Notes (Deleted)
Patient ID: [name] [name], female    DOB: May 23, 1961, 60 y.o.   MRN: [id number]  HPI  [name] [name] is a 60 y/o female with a history of DM, HTN, anemia, chronic hepatitis C and chronic heart failure.   Echo report from 11/07/19 reviewed and showed an EF of 60-65% along with mild LVH. Echo report from 06/04/2019 reviewed and showed an EF of 55-60% along with mild [name].   Was in the ED 07/25/20 due to cough and chest pain. CXR normal. Placed on oxygen due to hypoxia. Decadron given for wheezing. Tested + for COVID and was given remdesivir. Discharged the following day.   She presents today for a follow-up visit with a chief complaint of   Past Medical History:  Diagnosis Date  . Anemia   . CHF (congestive heart failure) ([place])   . Chronic hepatitis C without hepatic coma ([place]) 11/09/2016  . Diabetes mellitus   . Fibroids   . 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 Hypertension   . MRSA (methicillin resistant Staphylococcus aureus)   . Trichomonas   . VAGINITIS, BACTERIAL, RECURRENT 08/15/2007   Qualifier: Diagnosis of  By: Radene Ou MD, [place]     Past Surgical History:  Procedure Laterality Date  . BREAST BIOPSY Left 2018  . CESAREAN SECTION     breech   Family History  Problem Relation Age of Onset  . Colon cancer Mother   . Liver disease Sister   . Other Neg Hx   . Breast cancer Neg Hx   . Esophageal cancer Neg Hx   . Rectal cancer Neg Hx    Social History   Tobacco Use  . Smoking status: Never Smoker  . Smokeless tobacco: Never Used  Substance Use Topics  . Alcohol use: No   Allergies  Allergen Reactions  . Lisinopril Swelling     Review of Systems  Constitutional: Positive for fatigue. Negative for appetite change.  HENT: Positive for rhinorrhea. Negative for congestion, postnasal drip and sore throat.   Eyes: Negative.   Respiratory: Positive for cough ("just today") and shortness of breath (with moderate exertion). Negative for  chest tightness and wheezing.   Cardiovascular: Negative for chest pain, palpitations and leg swelling.  Gastrointestinal: Negative for abdominal distention and abdominal pain.  Endocrine: Negative.   Genitourinary: Negative.   Musculoskeletal: Negative for back pain and neck pain.  Skin: Negative.   Allergic/Immunologic: Negative.   Neurological: Positive for numbness (in legs due to diabetes). Negative for dizziness and light-headedness.  Hematological: Negative for adenopathy. Does not bruise/bleed easily.  Psychiatric/Behavioral: Negative for dysphoric mood and sleep disturbance (sleeping on 2 pillows). The patient is not nervous/anxious.      Physical Exam Vitals and nursing note reviewed.  Constitutional:      Appearance: Normal appearance.  HENT:     Head: Normocephalic and atraumatic.  Cardiovascular:     Rate and Rhythm: Normal rate and regular rhythm.  Pulmonary:     Effort: Pulmonary effort is normal. No respiratory distress.     Breath sounds: No wheezing or rales.  Abdominal:     General: There is no distension.     Palpations: Abdomen is soft.     Tenderness: There is no abdominal tenderness.  Musculoskeletal:        General: No tenderness.     Cervical back: Normal range of motion and neck supple.     Right lower leg: No edema.  Left lower leg: No edema.  Skin:    General: Skin is warm and dry.  Neurological:     General: No focal deficit present.     Mental Status: She is alert and oriented to person, place, and time.  Psychiatric:        Mood and Affect: Mood normal.        Behavior: Behavior normal.        Thought Content: Thought content normal.     Assessment & Plan:  1: Chronic heart failure with preserved ejection fraction- - NYHA class II - euvolemic today - not weighing daily but does have scales; instructed to begin weighing every morning so that she can call for an overnight weight gain of >2 pounds or a weekly weight gain of >5  pounds - weight 248.8 from last visit here 3 months ago - not adding salt but hasn't been looking at food labels; reviewed the importance of reading food labels closely so that she can follow a low sodium diet - saw cardiology (Hochrein) 08/02/19 - BNP 05/27/20 was 21.0 - wearing oxygen at 3L at bedtime   2: HTN- - BP  - saw PCP (Newlin) 06/03/20 - BMP 07/27/20 reviewed and showed sodium 138, potassium 4.5, creatinine 1.37 and GFR 44  3: DM- - fasting glucose in clinic today was  - A1c 07/26/20 was 6.7% - urine microalbumin / creatinine ratio 06/03/20 was quite elevated at 2271; may have been on losartan in the past; will discuss with her if she had any difficulties taking it as she has had angioedema with lisinopril   Medication bottles were reviewed.

## 2020-08-28 NOTE — Telephone Encounter (Signed)
Patient did not show for her [facility] appointment on 08/28/20. Will attempt to reschedule.

## 2020-09-03 ENCOUNTER — Other Ambulatory Visit: Payer: Self-pay

## 2020-09-03 ENCOUNTER — Other Ambulatory Visit: Payer: Self-pay | Admitting: Family

## 2020-09-03 MED ORDER — AMLODIPINE BESYLATE 10 MG PO TABS: 10.0000 mg | ORAL_TABLET | Freq: Every day | ORAL | 3 refills | Status: DC

## 2020-09-03 NOTE — Patient Outreach (Signed)
Meds due in [facility] on 24th. No refills of Amlodipine, reached out to Cardio  Needs Insulin and Victoza by end of week. Delivery for 09/05/20  F/U 09/26/20 for refills call

## 2020-09-05 ENCOUNTER — Other Ambulatory Visit: Payer: Self-pay | Admitting: Obstetrics and Gynecology

## 2020-09-05 NOTE — Patient Instructions (Signed)
Hi Ms. Wayment, sorry we missed you today  - as a part of your Medicaid benefit, you are eligible for care management and care coordination services at no cost or copay. I was unable to reach you by phone today but would be happy to help you with your health related needs. Please feel free to call me at [phone].  A member of the Managed Medicaid care management team will reach out to you again over the next 7 days.   [name] RN, BSN [place]  [facility] - Managed Medicaid High Risk [phone].

## 2020-09-05 NOTE — Patient Outreach (Signed)
Care Coordination  09/05/2020  [name] [name] 04/20/1961 [id number]    Medicaid Managed Care   Unsuccessful Outreach Note  09/05/2020 Name: [name] [name] MRN: [id number] DOB: 1960/12/11  Referred by: [name], MD Reason for referral : High Risk Managed Medicaid (Unsuccessful telephone outreach)   A second unsuccessful telephone outreach was attempted today. The patient was referred to the case management team for assistance with care management and care coordination.   Follow Up Plan: A member of the Managed Medicaid  care management team will reach out to the patient again over the next 7 days.   [name] RN, BSN [place]  [facility] - Managed Medicaid High Risk [phone].

## 2020-09-16 DIAGNOSIS — I5032 Chronic diastolic (congestive) heart failure: Secondary | ICD-10-CM | POA: Diagnosis not present

## 2020-09-24 ENCOUNTER — Telehealth: Payer: Self-pay | Admitting: Family Medicine

## 2020-09-24 NOTE — Telephone Encounter (Signed)
I called [name] [name] today to see about getting her phone visit with the Managed Medicaid team rescheduled. I left my contact info on her voicemail so she can return my call. I will reach out again in the next week if I have not heard back from her.

## 2020-09-26 ENCOUNTER — Ambulatory Visit: Payer: Self-pay

## 2020-10-02 ENCOUNTER — Other Ambulatory Visit: Payer: Self-pay

## 2020-10-02 ENCOUNTER — Other Ambulatory Visit: Payer: Medicaid Other

## 2020-10-02 NOTE — Patient Outreach (Signed)
Meds due in [facility] on 25th.   Still no Amlodipine needed since 90 days received previously.  F/U 1 Month

## 2020-10-16 DIAGNOSIS — Z7984 Long term (current) use of oral hypoglycemic drugs: Secondary | ICD-10-CM | POA: Diagnosis not present

## 2020-10-16 DIAGNOSIS — E113513 Type 2 diabetes mellitus with proliferative diabetic retinopathy with macular edema, bilateral: Secondary | ICD-10-CM | POA: Diagnosis not present

## 2020-10-16 DIAGNOSIS — Z9842 Cataract extraction status, left eye: Secondary | ICD-10-CM | POA: Diagnosis not present

## 2020-10-16 DIAGNOSIS — H4313 Vitreous hemorrhage, bilateral: Secondary | ICD-10-CM | POA: Diagnosis not present

## 2020-10-16 DIAGNOSIS — Z9841 Cataract extraction status, right eye: Secondary | ICD-10-CM | POA: Diagnosis not present

## 2020-10-16 DIAGNOSIS — Z794 Long term (current) use of insulin: Secondary | ICD-10-CM | POA: Diagnosis not present

## 2020-10-16 DIAGNOSIS — Z961 Presence of intraocular lens: Secondary | ICD-10-CM | POA: Diagnosis not present

## 2020-10-17 DIAGNOSIS — I5032 Chronic diastolic (congestive) heart failure: Secondary | ICD-10-CM | POA: Diagnosis not present

## 2020-10-22 ENCOUNTER — Other Ambulatory Visit: Payer: Self-pay

## 2020-10-29 ENCOUNTER — Other Ambulatory Visit: Payer: Self-pay

## 2020-10-29 ENCOUNTER — Other Ambulatory Visit: Payer: Self-pay | Admitting: Obstetrics and Gynecology

## 2020-10-29 NOTE — Patient Instructions (Signed)
Hi [name] [name], thank you for speaking with me today  [name] [name] was given information about Medicaid Managed Care team care coordination services as a part of their [place] Medicaid benefit. [name] [name] verbally consented to engagement with the [facility] Managed Care team.   For questions related to your [facility], please call: [phone] or visit the homepage here: [email or web address]  If you would like to schedule transportation through your [facility], please call the following number at least 2 days in advance of your appointment: [phone].   Call the [place] at [phone], at any time, 24 hours a day, 7 days a week. If you are in danger or need immediate medical attention call 911.  [name] [name] - following are the goals we discussed in your visit today:  Goals Addressed            This Visit's Progress   . Protect My Health       Timeframe:  Long-Range Goal Priority:  Medium Start Date:          10/29/20                   Expected End Date:      01/28/21                 Follow Up Date 11/28/20   - schedule appointment for flu shot - schedule appointment for vaccines needed due to my age or health - schedule recommended health tests (blood work, mammogram, colonoscopy, pap test) - schedule and keep appointment for annual check-up    Why is this important?    Screening tests can find diseases early when they are easier to treat.   Your doctor or nurse will talk with you about which tests are important for you.   Getting shots for common diseases like the flu and shingles will help prevent them.       Patient verbalizes understanding of instructions provided today.   The Managed Medicaid care management team will reach out to the patient again over the next 30 days.  The patient has been provided with contact  information for the Managed Medicaid care management team and has been advised to call with any health related questions or concerns.   [name] Raider RN, BSN [place]  [facility] - Managed Medicaid High Risk [phone].  Following is a copy of your plan of care:  Patient Care Plan: General Plan of Care (Adult)    Problem Identified: Quality of Life (General Plan of Care)     Long-Range Goal: Quality of Life Maintained   Start Date: 10/29/2020  Expected End Date: 01/28/2021  This Visit's Progress: On track  Priority: Medium  Note:    CARE PLAN ENTRY Medicaid Managed Care (see longitudinal plan of care for additional care plan information)  Current Barriers:  . Patient states leg swelling-went to see provider yesterday. Marland Kitchen Update 10/29/20:  Patient states leg swelling improved.  Needs follow up appointments with cardiologist and nephrologist.  Nurse Case Manager Clinical Goal(s):  Marland Kitchen Over the next 30 days, patient will verbalize understanding of plan for decreased swelling.  Update 06/10/20:  patient states swelling has decreased with compression hose/stockings . Over the next 30 days, patient will work with provider to address needs related to swelling.  Update 06/10/20:  patient was seen by her PCP, Dr. Margarita Rana, 06/03/20. Marland Kitchen Over the next 30  days, patient will schedule follow up appointments with cardiologist and nephrologist. . Over the next 30 days, patient will continue to work with Pharmacist.  Interventions:  . Inter-disciplinary care team collaboration (see longitudinal plan of care) . Reviewed medications with patient. . Discussed plans with patient for ongoing care management follow up and provided patient with direct contact information for care management team. . Collaboration with Pharmacist for review of medications.  Plan:  . Patient will follow up with provider and fill all prescriptions . RNCM will follow up with patient  within 30 days.   Evidence-based guidance:   Assess patient's thoughts about quality of life, goals and expectations, and dissatisfaction or desire to improve.   Identify issues of primary importance such as mental health, illness, exercise tolerance, pain, sexual function and intimacy, cognitive change, social isolation, finances and relationships.   Assess and monitor for signs/symptoms of psychosocial concerns, especially depression or ideations regarding harm to others or self; provide or refer for mental health services as needed.   Identify sensory issues that impact quality of life such as hearing loss, vision deficit; strategize ways to maintain or improve hearing, vision.   Promote access to services in the community to support independence such as support groups, home visiting programs, financial assistance, handicapped parking tags, durable medical equipment and emergency responder.   Promote activities to decrease social isolation such as group support or social, leisure and recreational activities, employment, use of social media; consider safety concerns about being out of home for activities.   Provide patient an opportunity to share by storytelling or a "life review" to give positive meaning to life and to assist with coping and negative experiences.   Encourage patient to tap into hope to improve sense of self.   Counsel based on prognosis and as early as possible about end-of-life and palliative care; consider referral to palliative care provider.   Advocate for the development of palliative care plan that may include avoidance of unnecessary testing and intervention, symptom control, discontinuation of medications, hospice and organ donation.   Counsel as early as possible those with life-limiting chronic disease about palliative care; consider referral to palliative care provider.   Advocate for the development of palliative care plan.

## 2020-10-29 NOTE — Patient Outreach (Signed)
Medicaid Managed Care   Nurse Care Manager Note  10/29/2020 Name:  [name] [name] MRN:  [id number] DOB:  [birth year]  [name] [name] is an 60 y.o. year old female who is a primary patient of [name], MD.  The [facility] Managed Care Coordination team was consulted for assistance with:    chronic healthcare management needs.  [name] [name] was given information about Medicaid Managed Care Coordination team services today. [name] [name] agreed to services and verbal consent obtained.  Engaged with patient by telephone for follow up visit in response to provider referral for case management and/or care coordination services.   Assessments/Interventions:  Review of past medical history, allergies, medications, health status, including review of consultants reports, laboratory and other test data, was performed as part of comprehensive evaluation and provision of chronic care management services.  SDOH (Social Determinants of Health) assessments and interventions performed:   Care Plan  Allergies  Allergen Reactions  . Lisinopril Swelling    Medications Reviewed Today    Reviewed by Gayla Medicus, RN (Registered Nurse) on 10/29/20 at 55  Med List Status: <None>  Medication Order Taking? Sig Documenting Provider Last Dose Status Informant  albuterol (VENTOLIN HFA) 108 (90 Base) MCG/ACT inhaler [id number] Yes Inhale 1 puff into the lungs every 4 (four) hours as needed for wheezing or shortness of breath. Arrien, Jimmy Picket, MD Taking Active   amLODipine (NORVASC) 10 MG tablet [id number] Yes Take 1 tablet (10 mg total) by mouth daily. [name], FNP Taking Active   atorvastatin (LIPITOR) 40 MG tablet [id number] Yes Take 1 tablet (40 mg total) by mouth daily. [name], FNP Taking Active Self  blood glucose meter kit and supplies KIT [id number] Yes Dispense based on patient and insurance preference. Use up to four times daily as directed. (FOR ICD-9 250.00, 250.01). Dustin Flock, MD Taking Active Self  furosemide (LASIX) 40 MG tablet [id number] Yes Take 1 tablet (40 mg total) by mouth 2 (two) times daily. [name], FNP Taking Active Self  guaiFENesin-dextromethorphan (ROBITUSSIN DM) 100-10 MG/5ML syrup [id number] Yes Take 5 mLs by mouth every 6 (six) hours as needed for cough. Arrien, Jimmy Picket, MD Taking Active   hydrALAZINE (APRESOLINE) 25 MG tablet [id number] Yes Take 25 mg by mouth 3 (three) times daily. [provider] Taking Active Pharmacy Records  insulin glargine (LANTUS SOLOSTAR) 100 UNIT/ML Solostar Pen [id number] Yes Inject 60 Units into the skin at bedtime. [name], MD Taking Active Self           Med Note [name] [name]   Fri Jul 25, 2020  3:53 PM) Pt using 60 units   ketoconazole (NIZORAL) 2 % cream [id number] Yes Apply 1 application topically daily. Criselda Peaches, DPM Taking Active   labetalol (NORMODYNE) 300 MG tablet [id number] No Take 1 tablet (300 mg total) by mouth 2 (two) times daily.  Patient not taking: No sig reported   [name], MD Not Taking Active Self           Med Note [name] [name], Evette Georges   Fri Jul 25, 2020  4:02 PM) On [facility] at [facility]  liraglutide (VICTOZA) 18 MG/3ML SOPN [id number] Yes Inject 1.8 mg into the skin daily. [name], MD Taking Active Self  Misc. Devices MISC [id number] Yes Rollator with Seat Shower Chair  Dx- CHF; Duration-lifetime. Weight 241 lbs [name], MD Taking Active Self  pregabalin (LYRICA) 75 MG capsule [id number] Yes Take 1 capsule (75 mg total) by mouth  2 (two) times daily. [name], MD Taking Active Self          Patient Active Problem List   Diagnosis Date Noted  . Class 3 obesity 07/27/2020  . Acute respiratory disease due to COVID-19 virus 07/25/2020  . Acute hypoxemic respiratory failure due to COVID-19 ([place]) 07/25/2020  . Morbid obesity ([place]) 11/23/2019  . Acute respiratory failure with hypoxia ([place]) 11/06/2019  . Chronic kidney  disease, stage 3b ([place]) 11/06/2019  . COPD with acute bronchitis ([place])   . Acute kidney failure ([place]) 08/18/2019  . Left ventricular hypertrophy 07/21/2019  . Educated about COVID-19 virus infection 07/21/2019  . Acute on chronic diastolic heart failure ([place])   . Hypoxia   . Dyspnea on exertion 05/09/2019  . Sleep apnea 05/09/2019  . Hyperglycemia 04/09/2019  . Snoring 03/13/2019  . Symptomatic anemia 08/17/2018  . Acute renal failure (ARF) ([place]) 08/17/2018  . Chronic cystitis 08/03/2018  . Diabetic neuropathy ([place]) 06/16/2017  . Non compliance w medication regimen 04/12/2017  . Family history of colon cancer in mother 11/17/2016  . Dyspareunia in female 11/11/2016  . Screen for colon cancer 11/11/2016  . History of ovarian cyst 11/11/2016  . Chronic hepatitis C without hepatic coma ([place]) 11/09/2016  . Hyperlipidemia 10/07/2016  . Insulin dependent type 2 diabetes mellitus ([place]) 01/29/2014  . Status post intraocular lens implant 11/08/2013  . PCO (posterior capsular opacification) 09/28/2013  . Pseudophakia 09/12/2013  . GERD (gastroesophageal reflux disease) 09/06/2013  . Hypertension 09/06/2013  . BREAST PAIN, BILATERAL 02/19/2010  . ANEMIA, IRON DEFICIENCY 10/03/2009  . LEUKOPENIA, MILD 09/19/2009  . Chest pain 09/19/2009  . LIPOMA 06/18/2009  . 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 TINEA PEDIS 05/07/2009  . SKIN LESION 05/07/2009  . COUGH 05/07/2009  . SUBSTANCE ABUSE, MULTIPLE 02/22/2007  . HCVD (hypertensive cardiovascular disease) 02/22/2007    Conditions to be addressed/monitored per PCP order:  chronic healthcare management needs, DM2, OSA, CHF, CKD, hyperlipidemia, hepatitis C.  Care Plan : General Plan of Care (Adult)  Updates made by Gayla Medicus, RN since 10/29/2020 12:00 AM    Problem: Quality of Life (General Plan of Care)     Long-Range Goal: Quality of Life Maintained   Start Date: 10/29/2020  Expected End Date: 01/28/2021  This Visit's Progress:  On track  Priority: Medium  Note:    CARE PLAN ENTRY Medicaid Managed Care (see longitudinal plan of care for additional care plan information)  Current Barriers:  . Patient states leg swelling-went to see provider yesterday. Marland Kitchen Update 10/29/20:  Patient states leg swelling improved.  Needs follow up appointments with cardiologist and nephrologist.  Nurse Case Manager Clinical Goal(s):  Marland Kitchen Over the next 30 days, patient will verbalize understanding of plan for decreased swelling.  Update 06/10/20:  patient states swelling has decreased with compression hose/stockings . Over the next 30 days, patient will work with provider to address needs related to swelling.  Update 06/10/20:  patient was seen by her PCP, Dr. Margarita Rana, 06/03/20. Marland Kitchen Over the next 30 days, patient will schedule follow up appointments with cardiologist and nephrologist. . Over the next 30 days, patient will continue to work with Pharmacist.  Interventions:  . Inter-disciplinary care team collaboration (see longitudinal plan of care) . Reviewed medications with patient. . Discussed plans with patient for ongoing care management follow up and provided patient with direct contact information for care management team. . Collaboration with Pharmacist for review of medications.  Plan:  . Patient will  follow up with provider and fill all prescriptions . RNCM will follow up with patient within 30 days.    Evidence-based guidance:   Assess patient's thoughts about quality of life, goals and expectations, and dissatisfaction or desire to improve.   Identify issues of primary importance such as mental health, illness, exercise tolerance, pain, sexual function and intimacy, cognitive change, social isolation, finances and relationships.   Assess and monitor for signs/symptoms of psychosocial concerns, especially depression or ideations regarding harm to others or self; provide or refer for mental health services as needed.    Identify sensory issues that impact quality of life such as hearing loss, vision deficit; strategize ways to maintain or improve hearing, vision.   Promote access to services in the community to support independence such as support groups, home visiting programs, financial assistance, handicapped parking tags, durable medical equipment and emergency responder.   Promote activities to decrease social isolation such as group support or social, leisure and recreational activities, employment, use of social media; consider safety concerns about being out of home for activities.   Provide patient an opportunity to share by storytelling or a "life review" to give positive meaning to life and to assist with coping and negative experiences.   Encourage patient to tap into hope to improve sense of self.   Counsel based on prognosis and as early as possible about end-of-life and palliative care; consider referral to palliative care provider.   Advocate for the development of palliative care plan that may include avoidance of unnecessary testing and intervention, symptom control, discontinuation of medications, hospice and organ donation.   Counsel as early as possible those with life-limiting chronic disease about palliative care; consider referral to palliative care provider.   Advocate for the development of palliative care plan.      Follow Up:  Patient agrees to Care Plan and Follow-up.  Plan: The Managed Medicaid care management team will reach out to the patient again over the next 30 days. and The patient has been provided with contact information for the Managed Medicaid care management team and has been advised to call with any health related questions or concerns.  Date/time of next scheduled RN care management/care coordination outreach:  11/28/20 at 330.

## 2020-10-31 ENCOUNTER — Other Ambulatory Visit: Payer: Self-pay

## 2020-10-31 NOTE — Patient Outreach (Signed)
Due for delivery on 11/10/20  Medication BB B L D BT PCP Refill Needed?  Amlodipine 10mg   1    No  Atorvastatin 40mg   1    No  Ferrous Sulfate 325mg   1  1  No  Furosemide 40mg   1  1  No  Hydralazine 25mg   1 1 1   No  Labetalol 300mg   1  1  No  Lantus      No  Pregabalin 75mg   1  1  No  Victoza       No    No requests from PCP needed. F/U 1 Month

## 2020-11-28 ENCOUNTER — Other Ambulatory Visit: Payer: Self-pay | Admitting: Obstetrics and Gynecology

## 2020-11-28 ENCOUNTER — Other Ambulatory Visit: Payer: Self-pay

## 2020-11-28 NOTE — Patient Outreach (Signed)
Medicaid Managed Care   Nurse Care Manager Note  11/28/2020 Name:  [name] [name] MRN:  [id number] DOB:  [birth year]  [name] [name] is an 60 y.o. year old female who is a primary patient of [name], MD.  The [facility] Managed Care Coordination team was consulted for assistance with:    chronic healthcare management needs.  [name] was given information about Medicaid Managed Care Coordination team services today. [name] [name] agreed to services and verbal consent obtained.  Engaged with patient by telephone for follow up visit in response to provider referral for case management and/or care coordination services.   Assessments/Interventions:  Review of past medical history, allergies, medications, health status, including review of consultants reports, laboratory and other test data, was performed as part of comprehensive evaluation and provision of chronic care management services.  SDOH (Social Determinants of Health) assessments and interventions performed:   Care Plan  Allergies  Allergen Reactions  . Lisinopril Swelling    Medications Reviewed Today    Reviewed by Gayla Medicus, RN (Registered Nurse) on 11/28/20 at [place] List Status: <None>  Medication Order Taking? Sig Documenting Provider Last Dose Status Informant  albuterol (VENTOLIN HFA) 108 (90 Base) MCG/ACT inhaler [id number] No Inhale 1 puff into the lungs every 4 (four) hours as needed for wheezing or shortness of breath. Arrien, Jimmy Picket, MD Taking Active   amLODipine (NORVASC) 10 MG tablet [id number] No Take 1 tablet (10 mg total) by mouth daily. [name], FNP Taking Active   atorvastatin (LIPITOR) 40 MG tablet [id number] No Take 1 tablet (40 mg total) by mouth daily. [name], FNP Taking Active Self  blood glucose meter kit and supplies KIT [id number] No Dispense based on patient and insurance preference. Use up to four times daily as directed. (FOR ICD-9 250.00, 250.01). Dustin Flock, MD Taking Active Self  furosemide (LASIX) 40 MG tablet [id number] No Take 1 tablet (40 mg total) by mouth 2 (two) times daily. [name], FNP Taking Active Self  guaiFENesin-dextromethorphan (ROBITUSSIN DM) 100-10 MG/5ML syrup [id number] No Take 5 mLs by mouth every 6 (six) hours as needed for cough. Arrien, Jimmy Picket, MD Taking Active   hydrALAZINE (APRESOLINE) 25 MG tablet [id number] No Take 25 mg by mouth 3 (three) times daily. [provider] Taking Active Pharmacy Records  insulin glargine (LANTUS SOLOSTAR) 100 UNIT/ML Solostar Pen [id number] No Inject 60 Units into the skin at bedtime. [name], MD Taking Active Self           Med Note Loann Quill   Fri Jul 25, 2020  3:53 PM) Pt using 60 units   ketoconazole (NIZORAL) 2 % cream [id number] No Apply 1 application topically daily. Criselda Peaches, DPM Taking Active   labetalol (NORMODYNE) 300 MG tablet [id number] No Take 1 tablet (300 mg total) by mouth 2 (two) times daily.  Patient not taking: No sig reported   [name], MD Not Taking Active Self           Med Note Carlye Grippe, Evette Georges   Fri Jul 25, 2020  4:02 PM) [facility]  liraglutide (VICTOZA) 18 MG/3ML SOPN [id number] No Inject 1.8 mg into the skin daily. [name], MD Taking Active Self  Misc. Devices MISC [id number] No Rollator with Seat Shower Chair  Dx- CHF; Duration-lifetime. Weight 241 lbs [name], MD Taking Active Self  pregabalin (LYRICA) 75 MG capsule [id number] No Take 1 capsule (75 mg total) by mouth  2 (two) times daily. [name], MD Taking Active Self          Patient Active Problem List   Diagnosis Date Noted  . Class 3 obesity 07/27/2020  . Acute respiratory disease due to COVID-19 virus 07/25/2020  . Acute hypoxemic respiratory failure due to COVID-19 ([place]) 07/25/2020  . Morbid obesity ([place]) 11/23/2019  . Acute respiratory failure with hypoxia ([place]) 11/06/2019  . Chronic kidney disease,  stage 3b ([place]) 11/06/2019  . COPD with acute bronchitis ([place])   . Acute kidney failure ([place]) 08/18/2019  . Left ventricular hypertrophy 07/21/2019  . Educated about COVID-19 virus infection 07/21/2019  . Acute on chronic diastolic heart failure ([place])   . Hypoxia   . Dyspnea on exertion 05/09/2019  . Sleep apnea 05/09/2019  . Hyperglycemia 04/09/2019  . Snoring 03/13/2019  . Symptomatic anemia 08/17/2018  . Acute renal failure (ARF) ([place]) 08/17/2018  . Chronic cystitis 08/03/2018  . Diabetic neuropathy ([place]) 06/16/2017  . Non compliance w medication regimen 04/12/2017  . Family history of colon cancer in mother 11/17/2016  . Dyspareunia in female 11/11/2016  . Screen for colon cancer 11/11/2016  . History of ovarian cyst 11/11/2016  . Chronic hepatitis C without hepatic coma ([place]) 11/09/2016  . Hyperlipidemia 10/07/2016  . Insulin dependent type 2 diabetes mellitus ([place]) 01/29/2014  . Status post intraocular lens implant 11/08/2013  . PCO (posterior capsular opacification) 09/28/2013  . Pseudophakia 09/12/2013  . GERD (gastroesophageal reflux disease) 09/06/2013  . Hypertension 09/06/2013  . BREAST PAIN, BILATERAL 02/19/2010  . ANEMIA, IRON DEFICIENCY 10/03/2009  . LEUKOPENIA, MILD 09/19/2009  . Chest pain 09/19/2009  . LIPOMA 06/18/2009  . 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 TINEA PEDIS 05/07/2009  . SKIN LESION 05/07/2009  . COUGH 05/07/2009  . SUBSTANCE ABUSE, MULTIPLE 02/22/2007  . HCVD (hypertensive cardiovascular disease) 02/22/2007    Conditions to be addressed/monitored per PCP order:  chronic healthcare management needs, DM2, OSA, CHF, CKD      Problem: Quality of Life (General Plan of Care)   Priority: Medium  Onset Date: 10/29/2020    Long-Range Goal: Quality of Life Maintained   Start Date: 10/29/2020  Expected End Date: 01/28/2021  Recent Progress: On track  Priority: Medium  Note:    CARE PLAN ENTRY Medicaid Managed Care (see longitudinal plan  of care for additional care plan information)  Current Barriers:  . Patient states leg swelling-went to see provider yesterday. Marland Kitchen Update 10/29/20:  Patient states leg swelling improved.  Needs follow up appointments with cardiologist and nephrologist. . Update 11/28/20:  Patient denies any leg swelling-has not seen cardiologist or nephrologist-plans on scheduling appointment with PCP.  Nurse Case Manager Clinical Goal(s):  Marland Kitchen Over the next 30 days, patient will verbalize understanding of plan for decreased swelling.  Update 06/10/20:  patient states swelling has decreased with compression hose/stockings . Over the next 30 days, patient will work with provider to address needs related to swelling.  Update 06/10/20:  patient was seen by her PCP, Dr. Margarita Rana, 06/03/20. Marland Kitchen Over the next 30 days, patient will schedule follow up appointments with cardiologist and nephrologist. . Update 11/28/20:  Patient has not scheduled appointments with cardiologist or nephrologist yet-plans on scheduling appointment with PCP. Marland Kitchen Over the next 30 days, patient will continue to work with Pharmacist. . Update 11/28/20:  Pharmacy continues to follow.  Interventions:  . Inter-disciplinary care team collaboration (see longitudinal plan of care) . Reviewed medications with patient. . Discussed plans with patient for  ongoing care management follow up and provided patient with direct contact information for care management team. . Collaboration with Pharmacist for review of medications.  Plan:  . Patient will follow up with provider and fill all prescriptions . RNCM will follow up with patient within 30 days.   Evidence-based guidance:   Assess patient's thoughts about quality of life, goals and expectations, and dissatisfaction or desire to improve.   Identify issues of primary importance such as mental health, illness, exercise tolerance, pain, sexual function and intimacy, cognitive change, social isolation, finances  and relationships.   Assess and monitor for signs/symptoms of psychosocial concerns, especially depression or ideations regarding harm to others or self; provide or refer for mental health services as needed.   Identify sensory issues that impact quality of life such as hearing loss, vision deficit; strategize ways to maintain or improve hearing, vision.   Promote access to services in the community to support independence such as support groups, home visiting programs, financial assistance, handicapped parking tags, durable medical equipment and emergency responder.   Promote activities to decrease social isolation such as group support or social, leisure and recreational activities, employment, use of social media; consider safety concerns about being out of home for activities.   Provide patient an opportunity to share by storytelling or a "life review" to give positive meaning to life and to assist with coping and negative experiences.   Encourage patient to tap into hope to improve sense of self.   Counsel based on prognosis and as early as possible about end-of-life and palliative care; consider referral to palliative care provider.   Advocate for the development of palliative care plan that may include avoidance of unnecessary testing and intervention, symptom control, discontinuation of medications, hospice and organ donation.   Counsel as early as possible those with life-limiting chronic disease about palliative care; consider referral to palliative care provider.   Advocate for the development of palliative care plan.      Follow Up:  Patient agrees to Care Plan and Follow-up.  Plan: The Managed Medicaid care management team will reach out to the patient again over the next 30 days. and The patient has been provided with contact information for the Managed Medicaid care management team and has been advised to call with any health related questions or concerns.  Date/time of next  scheduled RN care management/care coordination outreach:  12/24/20 at 330.

## 2020-11-28 NOTE — Patient Instructions (Signed)
Hi [name] [name], thank you for speaking with me today.  [name] [name] was given information about Medicaid Managed Care team care coordination services as a part of their [place] Medicaid benefit. [name] [name] verbally consented to engagement with the [facility] Managed Care team.   For questions related to your [facility], please call: [phone] or visit the homepage here: [email or web address]  If you would like to schedule transportation through your [facility], please call the following number at least 2 days in advance of your appointment: [phone].   Call the [place] at [phone], at any time, 24 hours a day, 7 days a week. If you are in danger or need immediate medical attention call 911.  [name] [name] - following are the goals we discussed in your visit today:  Goals Addressed            This Visit's Progress   . Protect My Health       Timeframe:  Long-Range Goal Priority:  Medium Start Date:          10/29/20                   Expected End Date:      01/28/21                 Follow Up Date 12/29/20   - schedule appointment for flu shot - schedule appointment for vaccines needed due to my age or health - schedule recommended health tests (blood work, mammogram, colonoscopy, pap test) - schedule and keep appointment for annual check-up  Update 11/28/20:  Patient to schedule follow up appointment with PCP.   Why is this important?    Screening tests can find diseases early when they are easier to treat.   Your doctor or nurse will talk with you about which tests are important for you.   Getting shots for common diseases like the flu and shingles will help prevent them.         Patient verbalizes understanding of instructions provided today.   The Managed Medicaid care management team will reach out to the patient  again over the next 30 days.  The patient has been provided with contact information for the Managed Medicaid care management team and has been advised to call with any health related questions or concerns.   [name] Raider RN, BSN [place]  [facility] - Managed Medicaid High Risk [phone].  Following is a copy of your plan of care:  Patient Care Plan: General Plan of Care (Adult)    Problem Identified: Quality of Life (General Plan of Care)   Priority: Medium  Onset Date: 10/29/2020    Long-Range Goal: Quality of Life Maintained   Start Date: 10/29/2020  Expected End Date: 01/28/2021  Recent Progress: On track  Priority: Medium  Note:    CARE PLAN ENTRY Medicaid Managed Care (see longitudinal plan of care for additional care plan information)  Current Barriers:  . Patient states leg swelling-went to see provider yesterday. [name] [name] Update 10/29/20:  Patient states leg swelling improved.  Needs follow up appointments with cardiologist and nephrologist. . Update 11/28/20:  Patient denies any leg swelling-has not seen cardiologist or nephrologist-plans on scheduling appointment with PCP.  Nurse Case Manager Clinical Goal(s):  [name] [name] Over the next 30 days, patient will verbalize understanding of plan for decreased swelling.  Update 06/10/20:  patient states swelling has decreased  with compression hose/stockings . Over the next 30 days, patient will work with provider to address needs related to swelling.  Update 06/10/20:  patient was seen by her PCP, Dr. Margarita Rana, 06/03/20. [name] [name] Over the next 30 days, patient will schedule follow up appointments with cardiologist and nephrologist. . Update 11/28/20:  Patient has not scheduled appointments with cardiologist or nephrologist yet-plans on scheduling appointment with PCP. [name] [name] Over the next 30 days, patient will continue to work with Pharmacist. . Update 11/28/20:  Pharmacy continues to follow.  Interventions:   . Inter-disciplinary care team collaboration (see longitudinal plan of care) . Reviewed medications with patient. . Discussed plans with patient for ongoing care management follow up and provided patient with direct contact information for care management team. . Collaboration with Pharmacist for review of medications.  Plan:  . Patient will follow up with provider and fill all prescriptions . RNCM will follow up with patient within 30 days.  Evidence-based guidance:   Assess patient's thoughts about quality of life, goals and expectations, and dissatisfaction or desire to improve.   Identify issues of primary importance such as mental health, illness, exercise tolerance, pain, sexual function and intimacy, cognitive change, social isolation, finances and relationships.   Assess and monitor for signs/symptoms of psychosocial concerns, especially depression or ideations regarding harm to others or self; provide or refer for mental health services as needed.   Identify sensory issues that impact quality of life such as hearing loss, vision deficit; strategize ways to maintain or improve hearing, vision.   Promote access to services in the community to support independence such as support groups, home visiting programs, financial assistance, handicapped parking tags, durable medical equipment and emergency responder.   Promote activities to decrease social isolation such as group support or social, leisure and recreational activities, employment, use of social media; consider safety concerns about being out of home for activities.   Provide patient an opportunity to share by storytelling or a "life review" to give positive meaning to life and to assist with coping and negative experiences.   Encourage patient to tap into hope to improve sense of self.

## 2020-12-02 ENCOUNTER — Other Ambulatory Visit: Payer: Self-pay

## 2020-12-02 NOTE — Patient Outreach (Signed)
Meds due for delivery 12/10/20  Medication BB B L EM BT  Ferrous Sulfate 325 mg   1  1   Hydralazine 25 mg   1 1 1    Amlodipine 10 mg   1     Pregabalin 75 mg  1  1   Labetalol 300 mg  1  1   Furosemide 40 mg   1  1   Atorvastatin 40 mg   1     Lantus Solostar  Victoza          Will ask PCP for refill of Victoza, Lantus, and Atorvastatin.  F/U 1 Month

## 2020-12-04 ENCOUNTER — Other Ambulatory Visit: Payer: Self-pay

## 2020-12-04 ENCOUNTER — Emergency Department: Payer: Medicaid Other

## 2020-12-04 ENCOUNTER — Inpatient Hospital Stay: Payer: Medicaid Other

## 2020-12-04 ENCOUNTER — Observation Stay
Admission: EM | Admit: 2020-12-04 | Discharge: 2020-12-05 | Disposition: A | Discharge status: Home / Self Care | Payer: Medicaid Other | Attending: Emergency Medicine | Admitting: Emergency Medicine

## 2020-12-04 DIAGNOSIS — E1122 Type 2 diabetes mellitus with diabetic chronic kidney disease: Secondary | ICD-10-CM | POA: Insufficient documentation

## 2020-12-04 DIAGNOSIS — Z8616 Personal history of COVID-19: Secondary | ICD-10-CM | POA: Diagnosis not present

## 2020-12-04 DIAGNOSIS — I10 Essential (primary) hypertension: Secondary | ICD-10-CM | POA: Diagnosis not present

## 2020-12-04 DIAGNOSIS — J9601 Acute respiratory failure with hypoxia: Secondary | ICD-10-CM | POA: Diagnosis not present

## 2020-12-04 DIAGNOSIS — K219 Gastro-esophageal reflux disease without esophagitis: Secondary | ICD-10-CM | POA: Diagnosis present

## 2020-12-04 DIAGNOSIS — Z79899 Other long term (current) drug therapy: Secondary | ICD-10-CM | POA: Insufficient documentation

## 2020-12-04 DIAGNOSIS — D631 Anemia in chronic kidney disease: Secondary | ICD-10-CM | POA: Insufficient documentation

## 2020-12-04 DIAGNOSIS — R0602 Shortness of breath: Secondary | ICD-10-CM | POA: Diagnosis not present

## 2020-12-04 DIAGNOSIS — J441 Chronic obstructive pulmonary disease with (acute) exacerbation: Secondary | ICD-10-CM | POA: Diagnosis not present

## 2020-12-04 DIAGNOSIS — I5033 Acute on chronic diastolic (congestive) heart failure: Secondary | ICD-10-CM | POA: Insufficient documentation

## 2020-12-04 DIAGNOSIS — N183 Chronic kidney disease, stage 3 unspecified: Secondary | ICD-10-CM | POA: Diagnosis not present

## 2020-12-04 DIAGNOSIS — N1832 Chronic kidney disease, stage 3b: Secondary | ICD-10-CM | POA: Insufficient documentation

## 2020-12-04 DIAGNOSIS — Z794 Long term (current) use of insulin: Secondary | ICD-10-CM | POA: Insufficient documentation

## 2020-12-04 DIAGNOSIS — Z20822 Contact with and (suspected) exposure to covid-19: Secondary | ICD-10-CM | POA: Insufficient documentation

## 2020-12-04 DIAGNOSIS — I130 Hypertensive heart and chronic kidney disease with heart failure and stage 1 through stage 4 chronic kidney disease, or unspecified chronic kidney disease: Secondary | ICD-10-CM | POA: Diagnosis not present

## 2020-12-04 DIAGNOSIS — D638 Anemia in other chronic diseases classified elsewhere: Secondary | ICD-10-CM | POA: Diagnosis present

## 2020-12-04 LAB — BASIC METABOLIC PANEL
Anion gap: 8 (ref 5–15)
BUN: 33 mg/dL — ABNORMAL HIGH (ref 6–20)
CO2: 24 mmol/L (ref 22–32)
Calcium: 8.6 mg/dL — ABNORMAL LOW (ref 8.9–10.3)
Chloride: 108 mmol/L (ref 98–111)
Creatinine, Ser: 1.75 mg/dL — ABNORMAL HIGH (ref 0.44–1.00)
GFR, Estimated: 33 mL/min — ABNORMAL LOW (ref >60)
Glucose, Bld: 239 mg/dL — ABNORMAL HIGH (ref 70–99)
Potassium: 3.5 mmol/L (ref 3.5–5.1)
Sodium: 140 mmol/L (ref 135–145)

## 2020-12-04 LAB — HEPATIC FUNCTION PANEL
ALT: 9 U/L (ref 0–44)
AST: 13 U/L — ABNORMAL LOW (ref 15–41)
Albumin: 3.8 g/dL (ref 3.5–5.0)
Alkaline Phosphatase: 58 U/L (ref 38–126)
Bilirubin, Direct: <0.1 mg/dL (ref 0.0–0.2)
Total Bilirubin: 0.6 mg/dL (ref 0.3–1.2)
Total Protein: 7.4 g/dL (ref 6.5–8.1)

## 2020-12-04 LAB — CBC
HCT: 29.5 % — ABNORMAL LOW (ref 36.0–46.0)
Hemoglobin: 9.4 g/dL — ABNORMAL LOW (ref 12.0–15.0)
MCH: 28.3 pg (ref 26.0–34.0)
MCHC: 31.9 g/dL (ref 30.0–36.0)
MCV: 88.9 fL (ref 80.0–100.0)
Platelets: 153 10*3/uL (ref 150–400)
RBC: 3.32 MIL/uL — ABNORMAL LOW (ref 3.87–5.11)
RDW: 13.5 % (ref 11.5–15.5)
WBC: 4 10*3/uL (ref 4.0–10.5)
nRBC: 0 % (ref 0.0–0.2)

## 2020-12-04 LAB — RESP PANEL BY RT-PCR (FLU A&B, COVID) ARPGX2
Influenza A by PCR: NEGATIVE
Influenza B by PCR: NEGATIVE
SARS Coronavirus 2 by RT PCR: NEGATIVE

## 2020-12-04 LAB — TROPONIN I (HIGH SENSITIVITY)
Troponin I (High Sensitivity): 4 ng/L (ref <18)
Troponin I (High Sensitivity): 4 ng/L (ref <18)

## 2020-12-04 LAB — BRAIN NATRIURETIC PEPTIDE: B Natriuretic Peptide: 97.4 pg/mL (ref 0.0–100.0)

## 2020-12-04 LAB — GLUCOSE, CAPILLARY: Glucose-Capillary: 332 mg/dL — ABNORMAL HIGH (ref 70–99)

## 2020-12-04 MED ORDER — METHYLPREDNISOLONE SODIUM SUCC 40 MG IJ SOLR
40.0000 mg | Freq: Two times a day (BID) | INTRAMUSCULAR | Status: AC
  Administered 2020-12-04 – 2020-12-05 (×2): 40 mg via INTRAVENOUS
  Filled 2020-12-04 (×2): qty 1

## 2020-12-04 MED ORDER — FUROSEMIDE 10 MG/ML IJ SOLN
40.0000 mg | Freq: Two times a day (BID) | INTRAMUSCULAR | Status: DC
  Administered 2020-12-04 – 2020-12-05 (×2): 40 mg via INTRAVENOUS
  Filled 2020-12-04 (×3): qty 4

## 2020-12-04 MED ORDER — IPRATROPIUM-ALBUTEROL 0.5-2.5 (3) MG/3ML IN SOLN
3.0000 mL | Freq: Four times a day (QID) | RESPIRATORY_TRACT | Status: DC
  Administered 2020-12-05 (×2): 3 mL via RESPIRATORY_TRACT
  Filled 2020-12-04: qty 3

## 2020-12-04 MED ORDER — INSULIN GLARGINE 100 UNIT/ML ~~LOC~~ SOLN
60.0000 [IU] | Freq: Every day | SUBCUTANEOUS | Status: DC
  Administered 2020-12-04: 60 [IU] via SUBCUTANEOUS
  Filled 2020-12-04 (×3): qty 0.6

## 2020-12-04 MED ORDER — IPRATROPIUM-ALBUTEROL 0.5-2.5 (3) MG/3ML IN SOLN
3.0000 mL | Freq: Once | RESPIRATORY_TRACT | Status: AC
  Administered 2020-12-04: 3 mL via RESPIRATORY_TRACT
  Filled 2020-12-04: qty 3

## 2020-12-04 MED ORDER — ALBUTEROL SULFATE (2.5 MG/3ML) 0.083% IN NEBU: 2.5000 mg | INHALATION_SOLUTION | RESPIRATORY_TRACT | Status: DC | PRN

## 2020-12-04 MED ORDER — INSULIN ASPART 100 UNIT/ML IJ SOLN
0.0000 [IU] | Freq: Three times a day (TID) | INTRAMUSCULAR | Status: DC
  Administered 2020-12-05: 20 [IU] via SUBCUTANEOUS
  Filled 2020-12-04: qty 1

## 2020-12-04 MED ORDER — IPRATROPIUM-ALBUTEROL 0.5-2.5 (3) MG/3ML IN SOLN: 3.0000 mL | Freq: Once | RESPIRATORY_TRACT | Status: DC

## 2020-12-04 MED ORDER — PREDNISONE 20 MG PO TABS: 40.0000 mg | ORAL_TABLET | Freq: Every day | ORAL | Status: DC

## 2020-12-04 MED ORDER — OXYMETAZOLINE HCL 0.05 % NA SOLN
1.0000 | Freq: Once | NASAL | Status: AC
  Administered 2020-12-04: 22:00:00 1 via NASAL
  Filled 2020-12-04: qty 15

## 2020-12-04 MED ORDER — ENOXAPARIN SODIUM 60 MG/0.6ML IJ SOSY
0.5000 mg/kg | PREFILLED_SYRINGE | INTRAMUSCULAR | Status: DC
  Administered 2020-12-04: 57.5 mg via SUBCUTANEOUS
  Filled 2020-12-04: qty 0.6

## 2020-12-04 MED ORDER — ENOXAPARIN SODIUM 40 MG/0.4ML IJ SOSY: 40.0000 mg | PREFILLED_SYRINGE | INTRAMUSCULAR | Status: DC

## 2020-12-04 MED ORDER — PREGABALIN 75 MG PO CAPS
75.0000 mg | ORAL_CAPSULE | Freq: Two times a day (BID) | ORAL | Status: DC
  Administered 2020-12-04 – 2020-12-05 (×2): 75 mg via ORAL
  Filled 2020-12-04 (×2): qty 1

## 2020-12-04 MED ORDER — METHYLPREDNISOLONE SODIUM SUCC 125 MG IJ SOLR
125.0000 mg | Freq: Once | INTRAMUSCULAR | Status: AC
  Administered 2020-12-04: 125 mg via INTRAVENOUS
  Filled 2020-12-04: qty 2

## 2020-12-04 MED ORDER — AMLODIPINE BESYLATE 10 MG PO TABS
10.0000 mg | ORAL_TABLET | Freq: Every day | ORAL | Status: DC
  Administered 2020-12-04 – 2020-12-05 (×2): 10 mg via ORAL
  Filled 2020-12-04 (×2): qty 1

## 2020-12-04 MED ORDER — ATORVASTATIN CALCIUM 20 MG PO TABS
40.0000 mg | ORAL_TABLET | Freq: Every day | ORAL | Status: DC
  Administered 2020-12-05: 40 mg via ORAL
  Filled 2020-12-04: qty 2

## 2020-12-04 MED ORDER — HYDRALAZINE HCL 50 MG PO TABS
25.0000 mg | ORAL_TABLET | Freq: Three times a day (TID) | ORAL | Status: DC
  Administered 2020-12-04 – 2020-12-05 (×2): 25 mg via ORAL
  Filled 2020-12-04 (×2): qty 1

## 2020-12-04 MED ORDER — FUROSEMIDE 40 MG PO TABS: 40.0000 mg | ORAL_TABLET | Freq: Two times a day (BID) | ORAL | Status: DC

## 2020-12-04 MED ORDER — INSULIN GLARGINE 100 UNIT/ML SOLOSTAR PEN: 60.0000 [IU] | PEN_INJECTOR | Freq: Every day | SUBCUTANEOUS | Status: DC

## 2020-12-04 NOTE — ED Notes (Signed)
Pt ambulated in room, oxygen dropped to 88-89% RA. Provider notified.

## 2020-12-04 NOTE — ED Provider Notes (Signed)
[facility] Emergency Department Provider Note  ____________________________________________   Event Date/Time   First MD Initiated Contact with Patient 12/04/20 1506     (approximate)  I have reviewed the triage vital signs and the nursing notes.   HISTORY  Chief Complaint Chest Pain and Shortness of Breath    HPI [name] [name] is a 60 y.o. female  With PMHx of hypertension, diabetes, CHF, COPD, here with shortness of breath.  The patient states that for the last 2 weeks, she has had significantly worsening nasal congestion, cough, wheezing, and dyspnea.  The dyspnea was initially with exertion but is now present at rest.  She said increase sputum production with yellow-green sputum.  She is also had increasing lower extremity swelling and feels like she has gained weight.  She said orthopnea.  Symptoms are worse with any kind of exertion or lying flat.  No alleviating factors.  No fevers or chills.  No known COVID exposures.        Past Medical History:  Diagnosis Date  . Anemia   . CHF (congestive heart failure) ([place])   . Chronic hepatitis C without hepatic coma ([place]) 11/09/2016  . Diabetes mellitus   . Fibroids   . 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 Hypertension   . MRSA (methicillin resistant Staphylococcus aureus)   . Trichomonas   . VAGINITIS, BACTERIAL, RECURRENT 08/15/2007   Qualifier: Diagnosis of  By: Radene Ou MD, [place]      Patient Active Problem List   Diagnosis Date Noted  . Class 3 obesity 07/27/2020  . Acute respiratory disease due to COVID-19 virus 07/25/2020  . Acute hypoxemic respiratory failure due to COVID-19 ([place]) 07/25/2020  . Morbid obesity ([place]) 11/23/2019  . Acute respiratory failure with hypoxia ([place]) 11/06/2019  . Chronic kidney disease, stage 3b ([place]) 11/06/2019  . COPD with acute bronchitis ([place])   . Acute kidney failure ([place]) 08/18/2019  . Left ventricular hypertrophy  07/21/2019  . Educated about COVID-19 virus infection 07/21/2019  . Acute on chronic diastolic heart failure ([place])   . Hypoxia   . Dyspnea on exertion 05/09/2019  . Sleep apnea 05/09/2019  . Hyperglycemia 04/09/2019  . Snoring 03/13/2019  . Symptomatic anemia 08/17/2018  . Acute renal failure (ARF) ([place]) 08/17/2018  . Chronic cystitis 08/03/2018  . Diabetic neuropathy ([place]) 06/16/2017  . Non compliance w medication regimen 04/12/2017  . Family history of colon cancer in mother 11/17/2016  . Dyspareunia in female 11/11/2016  . Screen for colon cancer 11/11/2016  . History of ovarian cyst 11/11/2016  . Chronic hepatitis C without hepatic coma ([place]) 11/09/2016  . Hyperlipidemia 10/07/2016  . Insulin dependent type 2 diabetes mellitus ([place]) 01/29/2014  . Status post intraocular lens implant 11/08/2013  . PCO (posterior capsular opacification) 09/28/2013  . Pseudophakia 09/12/2013  . GERD (gastroesophageal reflux disease) 09/06/2013  . Hypertension 09/06/2013  . BREAST PAIN, BILATERAL 02/19/2010  . ANEMIA, IRON DEFICIENCY 10/03/2009  . LEUKOPENIA, MILD 09/19/2009  . Chest pain 09/19/2009  . LIPOMA 06/18/2009  . 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 TINEA PEDIS 05/07/2009  . SKIN LESION 05/07/2009  . COUGH 05/07/2009  . SUBSTANCE ABUSE, MULTIPLE 02/22/2007  . HCVD (hypertensive cardiovascular disease) 02/22/2007    Past Surgical History:  Procedure Laterality Date  . BREAST BIOPSY Left 2018  . CESAREAN SECTION     breech    Prior to Admission medications   Medication Sig Start Date  End Date Taking? Authorizing Provider  albuterol (VENTOLIN HFA) 108 (90 Base) MCG/ACT inhaler Inhale 1 puff into the lungs every 4 (four) hours as needed for wheezing or shortness of breath. 07/27/20   Arrien, Jimmy Picket, MD  amLODipine (NORVASC) 10 MG tablet Take 1 tablet (10 mg total) by mouth daily. 09/03/20   [name], FNP  atorvastatin (LIPITOR) 40 MG tablet Take 1 tablet (40 mg  total) by mouth daily. 12/05/19   [name], FNP  blood glucose meter kit and supplies KIT Dispense based on patient and insurance preference. Use up to four times daily as directed. (FOR ICD-9 250.00, 250.01). 04/12/19   Dustin Flock, MD  furosemide (LASIX) 40 MG tablet Take 1 tablet (40 mg total) by mouth 2 (two) times daily. 12/05/19 12/04/20  [name], FNP  guaiFENesin-dextromethorphan (ROBITUSSIN DM) 100-10 MG/5ML syrup Take 5 mLs by mouth every 6 (six) hours as needed for cough. 07/27/20   Arrien, Jimmy Picket, MD  hydrALAZINE (APRESOLINE) 25 MG tablet Take 25 mg by mouth 3 (three) times daily. 06/26/20   [provider]  insulin glargine (LANTUS SOLOSTAR) 100 UNIT/ML Solostar Pen Inject 60 Units into the skin at bedtime. 06/03/20   [name], MD  ketoconazole (NIZORAL) 2 % cream Apply 1 application topically daily. 08/18/20   McDonald, Stephan Minister, DPM  labetalol (NORMODYNE) 300 MG tablet Take 1 tablet (300 mg total) by mouth 2 (two) times daily. Patient not taking: No sig reported 06/03/20   [name], MD  liraglutide (VICTOZA) 18 MG/3ML SOPN Inject 1.8 mg into the skin daily. 06/03/20   [name], MD  Misc. Devices MISC Rollator with Paramedic  Dx- CHF; Duration-lifetime. Weight 241 lbs 09/04/19   [name], MD  pregabalin (LYRICA) 75 MG capsule Take 1 capsule (75 mg total) by mouth 2 (two) times daily. 06/03/20   [name], MD    Allergies Lisinopril  Family History  Problem Relation Age of Onset  . Colon cancer Mother   . Liver disease Sister   . Other Neg Hx   . Breast cancer Neg Hx   . Esophageal cancer Neg Hx   . Rectal cancer Neg Hx     Social History Social History   Tobacco Use  . Smoking status: Never Smoker  . Smokeless tobacco: Never Used  Vaping Use  . Vaping Use: Never used  Substance Use Topics  . Alcohol use: No  . Drug use: No    Types: Cocaine, Marijuana    Comment: remote h/o cocaine and marijuana  use    Review of Systems  Review of Systems  Constitutional: Positive for fatigue. Negative for fever.  HENT: Negative for congestion and sore throat.   Eyes: Negative for visual disturbance.  Respiratory: Positive for cough, shortness of breath and wheezing.   Cardiovascular: Positive for leg swelling. Negative for chest pain.  Gastrointestinal: Negative for abdominal pain, diarrhea, nausea and vomiting.  Genitourinary: Negative for flank pain.  Musculoskeletal: Negative for back pain and neck pain.  Skin: Negative for rash and wound.  Neurological: Negative for weakness.  All other systems reviewed and are negative.    ____________________________________________  PHYSICAL EXAM:      VITAL SIGNS: ED Triage Vitals  Enc Vitals Group     BP 12/04/20 1141 (!) 154/76     Pulse Rate 12/04/20 1141 77     Resp 12/04/20 1141 18     Temp 12/04/20 1141 98.3 F (36.8 C)  Temp Source 12/04/20 1141 Oral     SpO2 12/04/20 1141 96 %     Weight 12/04/20 1142 250 lb (113.4 kg)     Height 12/04/20 1142 5' 4" (1.626 m)     Head Circumference --      Peak Flow --      Pain Score 12/04/20 1142 7     Pain Loc --      Pain Edu? --      Excl. in [place]? --      Physical Exam Vitals and nursing note reviewed.  Constitutional:      General: She is not in acute distress.    Appearance: She is well-developed.  HENT:     Head: Normocephalic and atraumatic.  Eyes:     Conjunctiva/sclera: Conjunctivae normal.  Cardiovascular:     Rate and Rhythm: Normal rate and regular rhythm.     Heart sounds: Normal heart sounds. No murmur heard. No friction rub.  Pulmonary:     Effort: Pulmonary effort is normal. No respiratory distress.     Breath sounds: Examination of the right-middle field reveals wheezing. Examination of the left-middle field reveals wheezing. Examination of the right-lower field reveals wheezing and rales. Examination of the left-lower field reveals wheezing and rales. Wheezing  and rales present.  Abdominal:     General: There is no distension.     Palpations: Abdomen is soft.     Tenderness: There is no abdominal tenderness.  Musculoskeletal:     Cervical back: Neck supple.     Right lower leg: Edema present.     Left lower leg: Edema present.  Skin:    General: Skin is warm.     Capillary Refill: Capillary refill takes less than 2 seconds.  Neurological:     Mental Status: She is alert and oriented to person, place, and time.     Motor: No abnormal muscle tone.       ____________________________________________   LABS (all labs ordered are listed, but only abnormal results are displayed)  Labs Reviewed  BASIC METABOLIC PANEL - Abnormal; Notable for the following components:      Result Value   Glucose, Bld 239 (*)    BUN 33 (*)    Creatinine, Ser 1.75 (*)    Calcium 8.6 (*)    GFR, Estimated 33 (*)    All other components within normal limits  CBC - Abnormal; Notable for the following components:   RBC 3.32 (*)    Hemoglobin 9.4 (*)    HCT 29.5 (*)    All other components within normal limits  HEPATIC FUNCTION PANEL - Abnormal; Notable for the following components:   AST 13 (*)    All other components within normal limits  BRAIN NATRIURETIC PEPTIDE  POC URINE PREG, ED  TROPONIN I (HIGH SENSITIVITY)  TROPONIN I (HIGH SENSITIVITY)    ____________________________________________  EKG: Normal sinus rhythm, ventricular rate 78.  PR 174, QRS 90, QTc 45.  No acute ST elevations or depressions.  No acute evidence of acute ischemia or infarct ________________________________________  RADIOLOGY All imaging, including plain films, CT scans, and ultrasounds, independently reviewed by me, and interpretations confirmed via formal radiology reads.  ED MD interpretation:   Chest x-ray: Clear  Official radiology report(s): DG Chest 2 View  Result Date: 12/04/2020 CLINICAL DATA:  Shortness of breath EXAM: CHEST - 2 VIEW COMPARISON:  07/25/2020  FINDINGS: Heart size is upper limits of normal, unchanged. No focal airspace consolidation, pleural effusion,  or pneumothorax. No acute osseous findings. IMPRESSION: No active cardiopulmonary disease. Electronically Signed   By: Davina Poke D.O.   On: 12/04/2020 13:08    ____________________________________________  PROCEDURES   Procedure(s) performed (including Critical Care):  Procedures  ____________________________________________  INITIAL IMPRESSION / MDM / [place] / ED COURSE  As part of my medical decision making, I reviewed the following data within the [place] notes reviewed and incorporated, Old chart reviewed, Notes from prior ED visits, and [place] Controlled Substance Database       *Candid Bovey was evaluated in Emergency Department on 12/04/2020 for the symptoms described in the history of present illness. She was evaluated in the context of the global COVID-19 pandemic, which necessitated consideration that the patient might be at risk for infection with the SARS-CoV-2 virus that causes COVID-19. Institutional protocols and algorithms that pertain to the evaluation of patients at risk for COVID-19 are in a state of rapid change based on information released by regulatory bodies including the CDC and federal and state organizations. These policies and algorithms were followed during the patient's care in the ED.  Some ED evaluations and interventions may be delayed as a result of limited staffing during the pandemic.*     Medical Decision Making:  60 yo F with PMHx as above including CHF, COPD, here with SOB. Pt with diffuse wheezing on exam and increased work of breathing.  Satting 87% on room air.  Differential includes COPD versus CHF versus combination of the 2.  EKG nonischemic and troponin negative, do not suspect ACS.  She has some trace lower extremity edema but BNP is normal, though there could also be a concomitant factor of  edema.  Chest x-ray is clear.  Patient ambulated and remains hypoxic despite receiving 2 DuoNeb's and steroids.  Will admit for further treatment and management.  COVID pending.  ____________________________________________  FINAL CLINICAL IMPRESSION(S) / ED DIAGNOSES  Final diagnoses:  COPD exacerbation ([place])  Acute respiratory failure with hypoxia ([place])     MEDICATIONS GIVEN DURING THIS VISIT:  Medications  methylPREDNISolone sodium succinate (SOLU-MEDROL) 125 mg/2 mL injection 125 mg (125 mg Intravenous Given 12/04/20 1527)  ipratropium-albuterol (DUONEB) 0.5-2.5 (3) MG/3ML nebulizer solution 3 mL (3 mLs Nebulization Given 12/04/20 1529)  ipratropium-albuterol (DUONEB) 0.5-2.5 (3) MG/3ML nebulizer solution 3 mL (3 mLs Nebulization Given 12/04/20 1528)     ED Discharge Orders    None       Note:  This document was prepared using Dragon voice recognition software and may include unintentional dictation errors.   Duffy Bruce, MD 12/04/20 [phone]

## 2020-12-04 NOTE — H&P (Addendum)
History and Physical    [name] [name] UUE:[id number] DOB: December 24, 1960 DOA: 12/04/2020  PCP: [name], MD   Patient coming from: Home  I have personally briefly reviewed patient's old medical records in [place]  Chief Complaint: Shortness of breath  HPI: [name] [name] is a 60 y.o. female with medical history significant for diabetes mellitus, hypertension, chronic diastolic dysfunction CHF and hepatitis C who presents to the ER for evaluation of shortness of breath which she has had for couple of days.  Shortness of breath is mostly with exertion and associated with chest tightness.  She has a cough productive of yellow phlegm.  She also has nasal congestion and wheezing.  Symptoms have been progressively worsening over the last couple of days.  She also complains of orthopnea and bilateral lower extremity swelling. She denies having any fever, no chills, no sick contacts, no abdominal pain, no nausea, no vomiting, no headache, no dizziness, no lightheadedness, no focal deficits, no blurred vision, no changes in her bowel habits, no urinary symptoms. In the emergency room she was noted to be hypoxic with room air pulse oximetry of 86% and received 2 nebulizer treatments with improvement in her pulse oximetry at rest to 92% but with ambulation dropped to 87%. Labs show sodium 140, potassium 3.5, chloride 108, bicarb 24, glucose 239, BUN 33, creatinine 1.75, calcium 8.6, alkaline phosphatase 58, albumin 3.8, AST 13, ALT 9, total protein 7.4, BNP 97.4, troponin 4, white count 4.0, hemoglobin 9.4, hematocrit 29.5, MCV 88.9, RDW 13.5, platelet count 153 Respiratory viral panel is negative Chest x-ray reviewed by me shows no acute cardiopulmonary disease CT scan of the chest without contrast shows minimal bibasilar subsegmental atelectasis. Twelve-lead EKG reviewed by me shows normal sinus rhythm.   ED Course: Patient is a 60 year old African-American female who presents to the ER for  evaluation of shortness of breath mostly with exertion associated with a cough productive of yellow phlegm, bilateral lower extremity swelling, orthopnea and congestion. She was hypoxic in the ER, with room air pulse oximetry of 86% and is currently on 2 L of oxygen to maintain pulse oximetry greater than 92%. She will be admitted to the hospital for further evaluation.   Review of Systems: As per HPI otherwise all other systems reviewed and negative.    Past Medical History:  Diagnosis Date  . Anemia   . CHF (congestive heart failure) ([place])   . Chronic hepatitis C without hepatic coma ([place]) 11/09/2016  . Diabetes mellitus   . Fibroids   . 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 Hypertension   . MRSA (methicillin resistant Staphylococcus aureus)   . Trichomonas   . VAGINITIS, BACTERIAL, RECURRENT 08/15/2007   Qualifier: Diagnosis of  By: Radene Ou MD, [place]      Past Surgical History:  Procedure Laterality Date  . BREAST BIOPSY Left 2018  . CESAREAN SECTION     breech     reports that she has never smoked. She has never used smokeless tobacco. She reports that she does not drink alcohol and does not use drugs.  Allergies  Allergen Reactions  . Lisinopril Swelling    Family History  Problem Relation Age of Onset  . Colon cancer Mother   . Liver disease Sister   . Other Neg Hx   . Breast cancer Neg Hx   . Esophageal cancer Neg Hx   . Rectal cancer Neg Hx  Prior to Admission medications   Medication Sig Start Date End Date Taking? Authorizing Provider  amLODipine (NORVASC) 10 MG tablet Take 1 tablet (10 mg total) by mouth daily. 09/03/20  Yes Darylene Price A, FNP  atorvastatin (LIPITOR) 40 MG tablet Take 1 tablet (40 mg total) by mouth daily. 12/05/19  Yes Hackney, Otila Kluver A, FNP  ferrous sulfate 325 (65 FE) MG tablet Take 325 mg by mouth daily with breakfast.   Yes [provider]  furosemide (LASIX) 40 MG tablet Take 1 tablet (40  mg total) by mouth 2 (two) times daily. 12/05/19 12/04/20 Yes Hackney, Otila Kluver A, FNP  hydrALAZINE (APRESOLINE) 25 MG tablet Take 25 mg by mouth 3 (three) times daily. 06/26/20  Yes [provider]  insulin glargine (LANTUS SOLOSTAR) 100 UNIT/ML Solostar Pen Inject 60 Units into the skin at bedtime. 06/03/20  Yes [name], MD  labetalol (NORMODYNE) 300 MG tablet Take 1 tablet (300 mg total) by mouth 2 (two) times daily. 06/03/20  Yes Newlin, Charlane Ferretti, MD  liraglutide (VICTOZA) 18 MG/3ML SOPN Inject 1.8 mg into the skin daily. 06/03/20  Yes [name], MD  pregabalin (LYRICA) 75 MG capsule Take 1 capsule (75 mg total) by mouth 2 (two) times daily. 06/03/20  Yes Newlin, Charlane Ferretti, MD  albuterol (VENTOLIN HFA) 108 (90 Base) MCG/ACT inhaler Inhale 1 puff into the lungs every 4 (four) hours as needed for wheezing or shortness of breath. 07/27/20   Arrien, Jimmy Picket, MD  blood glucose meter kit and supplies KIT Dispense based on patient and insurance preference. Use up to four times daily as directed. (FOR ICD-9 250.00, 250.01). 04/12/19   Dustin Flock, MD  guaiFENesin-dextromethorphan [facility] DM) 100-10 MG/5ML syrup Take 5 mLs by mouth every 6 (six) hours as needed for cough. 07/27/20   Arrien, Jimmy Picket, MD  ketoconazole (NIZORAL) 2 % cream Apply 1 application topically daily. Patient not taking: Reported on 12/04/2020 08/18/20   Criselda Peaches, DPM  Misc. Devices MISC Rollator with Paramedic  Dx- CHF; Duration-lifetime. Weight 241 lbs 09/04/19   [name], MD  Vitamin D, Ergocalciferol, (DRISDOL) 1.25 MG (50000 UNIT) CAPS capsule Take 1 capsule by mouth once a week. 09/09/20   [provider]    Physical Exam: Vitals:   12/04/20 1420 12/04/20 1430 12/04/20 1600 12/04/20 1825  BP:  (!) 147/71 (!) 142/87 (!) 173/87  Pulse:   71 85  Resp:  '18 12 18  ' Temp:      TempSrc:      SpO2: 94%  96% 97%  Weight:      Height:         Vitals:   12/04/20  1420 12/04/20 1430 12/04/20 1600 12/04/20 1825  BP:  (!) 147/71 (!) 142/87 (!) 173/87  Pulse:   71 85  Resp:  '18 12 18  ' Temp:      TempSrc:      SpO2: 94%  96% 97%  Weight:      Height:          Constitutional: Alert and oriented x 3. Not in any apparent distress.  Morbidly obese.  Facial puffiness HEENT:      Head: Normocephalic and atraumatic.         Eyes: PERLA, EOMI, Conjunctivae are normal. Sclera is non-icteric.       Mouth/Throat: Mucous membranes are moist.       Neck: Supple with no signs of meningismus. Cardiovascular: Regular rate and rhythm. No murmurs, gallops, or rubs.  2+ symmetrical distal pulses are present . No JVD. 2+ LE edema Respiratory: Respiratory effort normal.  Bilateral air entry.  Scattered wheezes in both lung fields. Gastrointestinal: Soft, non tender, and non distended with positive bowel sounds.  Central adiposity Genitourinary: No CVA tenderness. Musculoskeletal: Nontender with normal range of motion in all extremities. No cyanosis, or erythema of extremities. Neurologic:  Face is symmetric. Moving all extremities. No gross focal neurologic deficits . Skin: Skin is warm, dry.  No rash or ulcers Psychiatric: Mood and affect are normal   Labs on Admission: I have personally reviewed following labs and imaging studies  CBC: Recent Labs  Lab 12/04/20 1146  WBC 4.0  HGB 9.4*  HCT 29.5*  MCV 88.9  PLT 542   Basic Metabolic Panel: Recent Labs  Lab 12/04/20 1146  NA 140  K 3.5  CL 108  CO2 24  GLUCOSE 239*  BUN 33*  CREATININE 1.75*  CALCIUM 8.6*   GFR: Estimated Creatinine Clearance: 42.7 mL/min (A) (by C-G formula based on SCr of 1.75 mg/dL (H)). Liver Function Tests: Recent Labs  Lab 12/04/20 1425  AST 13*  ALT 9  ALKPHOS 58  BILITOT 0.6  PROT 7.4  ALBUMIN 3.8   No results for input(s): LIPASE, AMYLASE in the last 168 hours. No results for input(s): AMMONIA in the last 168 hours. Coagulation Profile: No results for  input(s): INR, PROTIME in the last 168 hours. Cardiac Enzymes: No results for input(s): CKTOTAL, CKMB, CKMBINDEX, TROPONINI in the last 168 hours. BNP (last 3 results) No results for input(s): PROBNP in the last 8760 hours. HbA1C: No results for input(s): HGBA1C in the last 72 hours. CBG: No results for input(s): GLUCAP in the last 168 hours. Lipid Profile: No results for input(s): CHOL, HDL, LDLCALC, TRIG, CHOLHDL, LDLDIRECT in the last 72 hours. Thyroid Function Tests: No results for input(s): TSH, T4TOTAL, FREET4, T3FREE, THYROIDAB in the last 72 hours. Anemia Panel: No results for input(s): VITAMINB12, FOLATE, FERRITIN, TIBC, IRON, RETICCTPCT in the last 72 hours. Urine analysis:    Component Value Date/Time   COLORURINE STRAW (A) 04/09/2019 1615   APPEARANCEUR CLOUDY (A) 04/09/2019 1615   LABSPEC 1.010 04/09/2019 1615   PHURINE 5.0 04/09/2019 1615   GLUCOSEU >=500 (A) 04/09/2019 1615   HGBUR SMALL (A) 04/09/2019 1615   HGBUR negative 06/29/2010 1356   BILIRUBINUR NEGATIVE 04/09/2019 1615   KETONESUR NEGATIVE 04/09/2019 1615   PROTEINUR 100 (A) 04/09/2019 1615   UROBILINOGEN 1.0 01/26/2015 0925   NITRITE NEGATIVE 04/09/2019 1615   LEUKOCYTESUR SMALL (A) 04/09/2019 1615    Radiological Exams on Admission: DG Chest 2 View  Result Date: 12/04/2020 CLINICAL DATA:  Shortness of breath EXAM: CHEST - 2 VIEW COMPARISON:  07/25/2020 FINDINGS: Heart size is upper limits of normal, unchanged. No focal airspace consolidation, pleural effusion, or pneumothorax. No acute osseous findings. IMPRESSION: No active cardiopulmonary disease. Electronically Signed   By: Davina Poke D.O.   On: 12/04/2020 13:08   CT CHEST WO CONTRAST  Result Date: 12/04/2020 CLINICAL DATA:  Shortness of breath, chest pain. EXAM: CT CHEST WITHOUT CONTRAST TECHNIQUE: Multidetector CT imaging of the chest was performed following the standard protocol without IV contrast. COMPARISON:  June 16, 2016.  FINDINGS: Cardiovascular: No significant vascular findings. Normal heart size. No pericardial effusion. Mediastinum/Nodes: No enlarged mediastinal or axillary lymph nodes. Thyroid gland, trachea, and esophagus demonstrate no significant findings. Lungs/Pleura: No pneumothorax or pleural effusion is noted. Minimal bibasilar subsegmental atelectasis is noted. Upper Abdomen: No  acute abnormality. Musculoskeletal: No chest wall mass or suspicious bone lesions identified. IMPRESSION: Minimal bibasilar subsegmental atelectasis. No other definite abnormality seen in the chest. Electronically Signed   By: Marijo Conception M.D.   On: 12/04/2020 18:19     Assessment/Plan Principal Problem:   COPD with acute exacerbation (HCC) Active Problems:   Acute on chronic diastolic heart failure (HCC)   Morbid obesity (HCC)   GERD (gastroesophageal reflux disease)   Hypertension   CKD stage 3 due to type 2 diabetes mellitus (HCC)   Anemia of chronic disease    COPD with acute exacerbation Place patient scheduled and as needed bronchodilator therapy as well as systemic steroids Continue oxygen supplementation to maintain pulse oximetry greater than 92%     Acute respiratory failure  Most likely secondary to COPD and acute diastolic dysfunction CHF exacerbation Patient had room air pulse oximetry of 86% with increased work of breathing and is currently on 2 L with pulse oximetry greater than 92% We will attempt to wean off oxygen as tolerated once acute illness has improved    Acute on chronic diastolic dysfunction CHF Patient with complaints of shortness of breath with exertion associated with bilateral lower extremity swelling and two-pillow orthopnea Last known LVEF of 55 to 60% with grade 2 diastolic dysfunction We will place patient on Lasix 40 mg IV twice daily Optimize blood pressure control Continue hydralazine and amlodipine     Diabetes mellitus with complications of stage IIIb chronic  kidney disease Maintain consistent carbohydrate diet Continue long-acting insulin Glycemic control with sliding scale insulin    Morbid obesity (BMI 42) Complicates overall prognosis and care Lifestyle modification and exercise has been discussed with patient   DVT prophylaxis: Lovenox Code Status: full code Family Communication: Greater than 50% of time was spent discussing patient's condition and plan of care with her at the bedside.  All questions and concerns have been addressed.  She verbalizes understanding and agrees with the plan.  CODE STATUS was discussed and she is a full code Disposition Plan: Back to previous home environment Consults called: none Status: At the time of admission, it appears that the appropriate admission status for this patient is inpatient. This is judged to be reasonable and necessary in order to provide the required intensity of service to ensure the patient's safety given the presenting symptoms, physical exam findings, and initial radiographic and laboratory data in the context of their comorbid conditions. Patient requires inpatient status due to high intensity of service, high risk for further deterioration and high frequency of surveillance required.    Phoebie Shad MD [facility]     12/04/2020, 7:00 PM

## 2020-12-04 NOTE — Progress Notes (Signed)
PHARMACIST - PHYSICIAN COMMUNICATION  CONCERNING:  Enoxaparin (Lovenox) for DVT Prophylaxis    RECOMMENDATION: Patient was prescribed enoxaprin 40mg  q24 hours for VTE prophylaxis.   Filed Weights   12/04/20 1142  Weight: 113.4 kg (250 lb)    Body mass index is 42.91 kg/m.  Estimated Creatinine Clearance: 42.7 mL/min (A) (by C-G formula based on SCr of 1.75 mg/dL (H)).   Based on [place] patient is candidate for enoxaparin 0.5mg /kg TBW SQ every 24 hours based on BMI being >30.  DESCRIPTION: Pharmacy has adjusted enoxaparin dose per [facility] policy.  Patient is now receiving enoxaparin 57.5 mg every 24 hours    Berta Minor, PharmD Clinical Pharmacist  12/04/2020 5:38 PM

## 2020-12-04 NOTE — Plan of Care (Signed)
  Problem: Education: Goal: Knowledge of disease or condition will improve 12/04/2020 2125 by Nicholes Calamity, RN Outcome: Progressing  Goal: Knowledge of the prescribed therapeutic regimen will improve 12/04/2020 2125 by Nicholes Calamity, RN Outcome: Progressing  Goal: Individualized Educational Video(s) 12/04/2020 2125 by Nicholes Calamity, RN Outcome: Progressing  Problem: Activity: Goal: Ability to tolerate increased activity will improve 12/04/2020 2125 by Nicholes Calamity, RN Outcome: Progressing  Goal: Will verbalize the importance of balancing activity with adequate rest periods 12/04/2020 2125 by Nicholes Calamity, RN Outcome: Progressing  Problem: Respiratory: Goal: Ability to maintain a clear airway will improve 12/04/2020 2125 by Nicholes Calamity, RN Outcome: Progressing  Goal: Levels of oxygenation will improve 12/04/2020 2125 by Nicholes Calamity, RN Outcome: Progressing  Goal: Ability to maintain adequate ventilation will improve 12/04/2020 2125 by Nicholes Calamity, RN Outcome: Progressing    Problem: Cardiac: Goal: Ability to achieve and maintain adequate cardiopulmonary perfusion will improve 12/04/2020 2125 by Nicholes Calamity, RN Outcome: Progressing    Problem: Metabolic: Goal: Ability to maintain appropriate glucose levels will improve 12/04/2020 2125 by Nicholes Calamity, RN Outcome: Progressing

## 2020-12-04 NOTE — ED Triage Notes (Signed)
Pt arrives via pov for CP and SOB this morning. Pt states she feels like she is retaining fluid. Pt report CP she started to subside and rates it at a 7/10. SOB increased with movement. Pt able to speak in full clear sentences in triage. NAD notes at this time

## 2020-12-04 NOTE — Plan of Care (Incomplete)
  Problem: Education: Goal: Knowledge of disease or condition will improve Outcome: Progressing Goal: Knowledge of the prescribed therapeutic regimen will improve Outcome: Progressing Goal: Individualized Educational Video(s) Outcome: Progressing   Problem: Activity: Goal: Ability to tolerate increased activity will improve Outcome: Progressing Goal: Will verbalize the importance of balancing activity with adequate rest periods Outcome: Progressing   Problem: Respiratory: Goal: Ability to maintain a clear airway will improve Outcome: Progressing Goal: Levels of oxygenation will improve Outcome: Progressing Goal: Ability to maintain adequate ventilation will improve Outcome: Progressing   Problem: Cardiac: Goal: Ability to achieve and maintain adequate cardiopulmonary perfusion will improve Outcome: Progressing   Problem: Metabolic: Goal: Ability to maintain appropriate glucose levels will improve Outcome: Progressing

## 2020-12-05 DIAGNOSIS — J9601 Acute respiratory failure with hypoxia: Secondary | ICD-10-CM | POA: Diagnosis not present

## 2020-12-05 DIAGNOSIS — J441 Chronic obstructive pulmonary disease with (acute) exacerbation: Secondary | ICD-10-CM | POA: Diagnosis present

## 2020-12-05 LAB — BASIC METABOLIC PANEL
Anion gap: 9 (ref 5–15)
BUN: 37 mg/dL — ABNORMAL HIGH (ref 6–20)
CO2: 23 mmol/L (ref 22–32)
Calcium: 9.1 mg/dL (ref 8.9–10.3)
Chloride: 104 mmol/L (ref 98–111)
Creatinine, Ser: 1.82 mg/dL — ABNORMAL HIGH (ref 0.44–1.00)
GFR, Estimated: 32 mL/min — ABNORMAL LOW (ref >60)
Glucose, Bld: 456 mg/dL — ABNORMAL HIGH (ref 70–99)
Potassium: 4.4 mmol/L (ref 3.5–5.1)
Sodium: 136 mmol/L (ref 135–145)

## 2020-12-05 LAB — CBC
HCT: 31.5 % — ABNORMAL LOW (ref 36.0–46.0)
Hemoglobin: 10 g/dL — ABNORMAL LOW (ref 12.0–15.0)
MCH: 27.9 pg (ref 26.0–34.0)
MCHC: 31.7 g/dL (ref 30.0–36.0)
MCV: 88 fL (ref 80.0–100.0)
Platelets: 160 10*3/uL (ref 150–400)
RBC: 3.58 MIL/uL — ABNORMAL LOW (ref 3.87–5.11)
RDW: 13.3 % (ref 11.5–15.5)
WBC: 5.7 10*3/uL (ref 4.0–10.5)
nRBC: 0 % (ref 0.0–0.2)

## 2020-12-05 LAB — HIV ANTIBODY (ROUTINE TESTING W REFLEX): HIV Screen 4th Generation wRfx: NONREACTIVE

## 2020-12-05 LAB — HEMOGLOBIN A1C
Hgb A1c MFr Bld: 7.6 % — ABNORMAL HIGH (ref 4.8–5.6)
Mean Plasma Glucose: 171.42 mg/dL

## 2020-12-05 LAB — GLUCOSE, CAPILLARY: Glucose-Capillary: 388 mg/dL — ABNORMAL HIGH (ref 70–99)

## 2020-12-05 MED ORDER — PREDNISONE 10 MG PO TABS: ORAL_TABLET | ORAL | 0 refills | Status: AC

## 2020-12-05 NOTE — Evaluation (Signed)
Physical Therapy Evaluation Patient Details Name: [name] [name] MRN: [id number] DOB: [birth year] Today's Date: 12/05/2020   History of Present Illness  Pt admitted for COPD exacerbation. History includes DM, HTN, CHF, and Hep C. Elevated BG this date, however not symptomatic at time of evalution.  Clinical Impression  Pt is a pleasant 60 year old female who was admitted for COPD exacerbation.  Pt demonstrates all bed mobility/transfers/ambulation at baseline level without AD. Does demonstrate decreased endurance, may benefit from pulmonary rehab. Brochure given to patient.  HR increases to 103bpm with exertion. Pt does not require any further PT needs at this time. Pt will be dc in house and does not require follow up. RN aware. Will dc current orders.  SaO2 on room air at rest = 92% SaO2 on room air while ambulating = 91% Doesn't qualify for home O2     Follow Up Recommendations No PT follow up    Equipment Recommendations  None recommended by PT    Recommendations for Other Services       Precautions / Restrictions Precautions Precautions: Fall Restrictions Weight Bearing Restrictions: No      Mobility  Bed Mobility               General bed mobility comments: received in standing, not performed this date    Transfers Overall transfer level: Independent Equipment used: None             General transfer comment: safe technique with ease of transfer  Ambulation/Gait Ambulation/Gait assistance: Independent Gait Distance (Feet): 130 Feet Assistive device: None Gait Pattern/deviations: WFL(Within Functional Limits)     General Gait Details: ambulated with reciprocal gait pattern, however does fatigue with exertion, requiring one standing rest break. SOB symptoms noted. O2 monitored throughout.  Stairs            Wheelchair Mobility    Modified Rankin (Stroke Patients Only)       Balance Overall balance assessment: Independent                                            Pertinent Vitals/Pain Pain Assessment: No/denies pain    Home Living Family/patient expects to be discharged to:: Private residence Living Arrangements: Spouse/significant other Available Help at Discharge: Family Type of Home: House       Home Layout:  (split level home) Home Equipment: Environmental consultant - 4 wheels      Prior Function Level of Independence: Independent with assistive device(s)         Comments: use of rollator for PRN     Hand Dominance        Extremity/Trunk Assessment   Upper Extremity Assessment Upper Extremity Assessment: Overall WFL for tasks assessed    Lower Extremity Assessment Lower Extremity Assessment: Overall WFL for tasks assessed       Communication   Communication: No difficulties  Cognition Arousal/Alertness: Awake/alert Behavior During Therapy: WFL for tasks assessed/performed Overall Cognitive Status: Within Functional Limits for tasks assessed                                        General Comments      Exercises     Assessment/Plan    PT Assessment Patent does not need any further PT services  PT Problem  List         PT Treatment Interventions      PT Goals (Current goals can be found in the Care Plan section)  Acute Rehab PT Goals Patient Stated Goal: to go home PT Goal Formulation: All assessment and education complete, DC therapy Time For Goal Achievement: 12/05/20 Potential to Achieve Goals: Good    Frequency     Barriers to discharge        Co-evaluation               AM-PAC PT "6 Clicks" Mobility  Outcome Measure Help needed turning from your back to your side while in a flat bed without using bedrails?: None Help needed moving from lying on your back to sitting on the side of a flat bed without using bedrails?: None Help needed moving to and from a bed to a chair (including a wheelchair)?: None Help needed standing up from a chair  using your arms (e.g., wheelchair or bedside chair)?: None Help needed to walk in hospital room?: None Help needed climbing 3-5 steps with a railing? : None 6 Click Score: 24    End of Session   Activity Tolerance: Patient tolerated treatment well Patient left: in chair Nurse Communication: Mobility status PT Visit Diagnosis: Difficulty in walking, not elsewhere classified (R26.2)    Time: 1001-1017 PT Time Calculation (min) (ACUTE ONLY): 16 min   Charges:   PT Evaluation $PT Eval Low Complexity: 1 Low PT Treatments $Gait Training: 8-22 mins        Greggory Stallion, PT, DPT [phone]   Lourdes Kucharski 12/05/2020, 12:45 PM

## 2020-12-05 NOTE — Discharge Summary (Addendum)
Physician Discharge Summary  Patient ID: [name] [name] MRN: [id number] DOB/AGE: 1961-06-16 60 y.o.  Admit date: 12/04/2020 Discharge date: 12/05/2020  Admission Diagnoses:  Discharge Diagnoses:  Principal Problem:   COPD with acute exacerbation [facility]) Active Problems:   Morbid obesity (HCC)   GERD (gastroesophageal reflux disease)   Hypertension   CKD stage 3 due to type 2 diabetes mellitus (HCC)   Anemia of chronic disease   COPD exacerbation (HCC) Chronic diastolic congestive heart failure.  Discharged Condition: good  Hospital Course: [name] [name] is a 60 y.o. female with medical history significant for diabetes mellitus, hypertension, chronic diastolic dysfunction CHF and hepatitis C who presents to the ER for evaluation of shortness of breath which she has had for couple of days.  Shortness of breath is mostly with exertion and associated with chest tightness.  She has a cough productive of yellow phlegm.  She also has nasal congestion and wheezing.  She is diagnosed with COP exacerbation, she is giving IV steroids.  She also received IV Lasix.  However her BNP was 97, she does not have volume overload.  Therefore, acute on chronic diastolic congestive heart failure ruled out. Her condition so far had improved since last night, she is off oxygen, she has no wheezing.  Short of breath much better.  At this point she is medically stable to be discharged.  1.  Acute hypoxemic respiratory failure secondary to COPD exacerbation. COPD exacerbation. Hypoxemia has resolved, short of breath improved.  Continue oral steroid taper for short course.  He is advised to follow-up with PCP in 1 week time.  2.  Chronic diastolic congestive heart failure. Patient does not seem to have any acute exacerbation.  #3.  Chronic kidney disease stage IIIb with type 2 diabetes. Resume all home diabetic medicines.  4.  Morbid obesity.  Has documented last bowel movement was 5/15.  She states that  that she has been having diarrhea on the weekend, last loose stool was on Monday.  She did not feel constipated.   Consults: None  Significant Diagnostic Studies:   CT CHEST WITHOUT CONTRAST  TECHNIQUE: Multidetector CT imaging of the chest was performed following the standard protocol without IV contrast.  COMPARISON:  June 16, 2016.  FINDINGS: Cardiovascular: No significant vascular findings. Normal heart size. No pericardial effusion.  Mediastinum/Nodes: No enlarged mediastinal or axillary lymph nodes. Thyroid gland, trachea, and esophagus demonstrate no significant findings.  Lungs/Pleura: No pneumothorax or pleural effusion is noted. Minimal bibasilar subsegmental atelectasis is noted.  Upper Abdomen: No acute abnormality.  Musculoskeletal: No chest wall mass or suspicious bone lesions identified.  IMPRESSION: Minimal bibasilar subsegmental atelectasis. No other definite abnormality seen in the chest.   Electronically Signed   By: Marijo Conception M.D.   On: 12/04/2020 18:19  Treatments: Solu-Medrol.  Discharge Exam: Blood pressure (!) 173/81, pulse 94, temperature 98.7 F (37.1 C), resp. rate (!) 22, height _0  (1.676 m), weight 115.7 kg, last menstrual period 04/01/2010, SpO2 97 %. General appearance: alert and cooperative Resp: Decreased breathing sounds without crackles or wheezes. Cardio: regular rate and rhythm, S1, S2 normal, no murmur, click, rub or gallop GI: soft, non-tender; bowel sounds normal; no masses,  no organomegaly Extremities: extremities normal, atraumatic, no cyanosis or edema  Disposition: Discharge disposition: 01-Home or Self Care       Discharge Instructions    Diet - low sodium heart healthy   Complete by: As directed    Increase activity slowly   Complete  by: As directed      Allergies as of 12/05/2020      Reactions   Lisinopril Swelling      Medication List    TAKE these medications   albuterol  108 (90 Base) MCG/ACT inhaler Commonly known as: VENTOLIN HFA Inhale 1 puff into the lungs every 4 (four) hours as needed for wheezing or shortness of breath.   amLODipine 10 MG tablet Commonly known as: NORVASC Take 1 tablet (10 mg total) by mouth daily.   atorvastatin 40 MG tablet Commonly known as: LIPITOR Take 1 tablet (40 mg total) by mouth daily.   blood glucose meter kit and supplies Kit Dispense based on patient and insurance preference. Use up to four times daily as directed. (FOR ICD-9 250.00, 250.01).   ferrous sulfate 325 (65 FE) MG tablet Take 325 mg by mouth daily with breakfast.   furosemide 40 MG tablet Commonly known as: LASIX Take 1 tablet (40 mg total) by mouth 2 (two) times daily.   guaiFENesin-dextromethorphan 100-10 MG/5ML syrup Commonly known as: ROBITUSSIN DM Take 5 mLs by mouth every 6 (six) hours as needed for cough.   hydrALAZINE 25 MG tablet Commonly known as: APRESOLINE Take 25 mg by mouth 3 (three) times daily.   ketoconazole 2 % cream Commonly known as: NIZORAL Apply 1 application topically daily.   labetalol 300 MG tablet Commonly known as: NORMODYNE Take 1 tablet (300 mg total) by mouth 2 (two) times daily.   Lantus SoloStar 100 UNIT/ML Solostar Pen Generic drug: insulin glargine Inject 60 Units into the skin at bedtime.   Misc. Devices Environmental health practitioner  Dx- CHF; Duration-lifetime. Weight 241 lbs   predniSONE 10 MG tablet Commonly known as: DELTASONE Take 4 tablets (40 mg total) by mouth daily for 2 days, THEN 2 tablets (20 mg total) daily for 3 days, THEN 1 tablet (10 mg total) daily for 3 days. Start taking on: Dec 05, 2020   pregabalin 75 MG capsule Commonly known as: Lyrica Take 1 capsule (75 mg total) by mouth 2 (two) times daily.   Victoza 18 MG/3ML Sopn Generic drug: liraglutide Inject 1.8 mg into the skin daily.   Vitamin D (Ergocalciferol) 1.25 MG (50000 UNIT) Caps capsule Commonly known as:  DRISDOL Take 1 capsule by mouth once a week.       Follow-up Information    [name], MD Follow up in 1 week(s).   Specialty: Family Medicine Contact information: [place] [address] [phone]        Minus Breeding, MD .   Specialty: Cardiology Contact information: [address] [place] [address] [phone]               Signed: Sharen Hones 12/05/2020, 9:58 AM

## 2020-12-05 NOTE — Evaluation (Signed)
Occupational Therapy Evaluation Patient Details Name: [name] [name] MRN: [id number] DOB: July 28, 1960 Today's Date: 12/05/2020    History of Present Illness Pt admitted for COPD exacerbation. History includes DM, HTN, CHF, and Hep C. Elevated BG this date, however not symptomatic at time of evalution.   Clinical Impression   Pt seen for OT evaluation this date in setting of acute hospitalization d/t COPD exacerbation. Pt reports geeling better this date. Pt presents with some decreased fxl activity tolerance, but appears to otherwise be at her functional baseline of MOD I to INDEP. Pt requires some increased time or rest breaks r/t cardiopulmonary status, but is otherwise able to safely demonstrate all functional transfers with no AD and fxl mobility in the room with no physical assistance. Pt able to perform all assessed aspects of self care including UB/LB dressing, I'ly. OT educates re: role of OT as well as EC strategies/considerations. Pt with good understanding, No further OT needs identified at this time. Pt could potentially benefit from OP pulmonary rehab follow up the the team finds this appropriate.     Follow Up Recommendations  No OT follow up;Other (comment) (pulmonary rehab if appropriate)    Equipment Recommendations  None recommended by OT    Recommendations for Other Services       Precautions / Restrictions Precautions Precautions: Fall Restrictions Weight Bearing Restrictions: No      Mobility Bed Mobility Overal bed mobility: Independent             General bed mobility comments: ease with sup to sit    Transfers Overall transfer level: Independent Equipment used: None             General transfer comment: good control    Balance Overall balance assessment: Independent                                         ADL either performed or assessed with clinical judgement   ADL Overall ADL's : Modified independent;At baseline                                        General ADL Comments: increasd time and rest breaks     Vision Patient Visual Report: No change from baseline       Perception     Praxis      Pertinent Vitals/Pain Pain Assessment: No/denies pain     Hand Dominance     Extremity/Trunk Assessment Upper Extremity Assessment Upper Extremity Assessment: Overall WFL for tasks assessed   Lower Extremity Assessment Lower Extremity Assessment: Overall WFL for tasks assessed       Communication Communication Communication: No difficulties   Cognition Arousal/Alertness: Awake/alert Behavior During Therapy: WFL for tasks assessed/performed Overall Cognitive Status: Within Functional Limits for tasks assessed                                     General Comments       Exercises Other Exercises Other Exercises: OT facilitates ed re: role of OT. Importance of OOB activity, potential need for pulm rehab.   Shoulder Instructions      Home Living Family/patient expects to be discharged to:: Private residence Living Arrangements: Spouse/significant other;Children (son Gerald Stabs and "  Mammie Russian) Available Help at Discharge: Family Type of Home: House Home Access: Stairs to enter [employer] of Steps: 2 Entrance Stairs-Rails: None Home Layout: Other (Comment);Multi-level (split level) Alternate Level Stairs-Number of Steps: 2 steps to the bottom level, 6 to the top. Pt states she often sleeps on the couch on the main level d/t too fatigues to make it upstairs.       Bathroom Toilet: Standard     Home Equipment: Walker - 4 wheels          Prior Functioning/Environment Level of Independence: Independent with assistive device(s)        Comments: use of rollator for PRN        OT Problem List: Decreased activity tolerance;Cardiopulmonary status limiting activity      OT Treatment/Interventions: Self-care/ADL training;Energy  conservation;Therapeutic activities    OT Goals(Current goals can be found in the care plan section) Acute Rehab OT Goals Patient Stated Goal: to go home OT Goal Formulation: All assessment and education complete, DC therapy  OT Frequency:     Barriers to D/C:            Co-evaluation              AM-PAC OT "6 Clicks" Daily Activity     Outcome Measure Help from another person eating meals?: None Help from another person taking care of personal grooming?: None Help from another person toileting, which includes using toliet, bedpan, or urinal?: None Help from another person bathing (including washing, rinsing, drying)?: None Help from another person to put on and taking off regular upper body clothing?: None Help from another person to put on and taking off regular lower body clothing?: None 6 Click Score: 24   End of Session    Activity Tolerance: Patient tolerated treatment well Patient left: Other (comment) (stnading with PT presenting for assessment)  OT Visit Diagnosis: Unsteadiness on feet (R26.81)                Time: 0941-1000 OT Time Calculation (min): 19 min Charges:  OT General Charges $OT Visit: 1 Visit OT Evaluation $OT Eval Low Complexity: 1 Low OT Treatments $Self Care/Home Management : 8-22 mins  Gerrianne Scale, MS, OTR/L ascom [phone] 12/05/20, 1:55 PM

## 2020-12-05 NOTE — TOC Transition Note (Signed)
Transition of Care [facility]) - CM/SW Discharge Note   Patient Details  Name: [name] [name] MRN: [id number] Date of Birth: 1961-06-30  [facility] CM/SW Contact:  Pete Pelt, RN Phone Number: 12/05/2020, 10:59 AM   Clinical Narrative:   TOC in to discuss discharge with patient.  Patient states "I live with other people", but did not specify who was in the household.  She states she has no concerns about going home at discharge.  Patient has no concerns about getting medications or getting to appointments.    When asked if patient had services at home, she stated she does not have home health, but she has empty oxygen tanks from her last discharge.  PT did home O2 test and patient does not qualify for O2 at home.  Patient's oxygen supplier was [employer], Andree Coss notified of tanks at patient's home and will arrange for pick up.  Patient denies further TOC needs.    Final next level of care: Home/Self Care Barriers to Discharge: Barriers Resolved   Patient Goals and CMS Choice Patient states their goals for this hospitalization and ongoing recovery are:: "I want to go home.  I am ready."   Choice offered to / list presented to : NA  Discharge Placement                       Discharge Plan and Services   Discharge Planning Services: CM Consult Post Acute Care Choice: NA            DME Agency:  (n/a)       HH Arranged:  (n/a)          Social Determinants of Health (SDOH) Interventions     Readmission Risk Interventions Readmission Risk Prevention Plan 08/18/2019 06/05/2019 04/12/2019  Transportation Screening Complete Complete Complete  PCP or Specialist Appt within 3-5 Days - - (No Data)  Palliative Care Screening - - Not Applicable  Medication Review (RN Care Manager) Complete Complete Complete  PCP or Specialist appointment within 3-5 days of discharge Complete Complete -  [place] or [place] - Complete -  SW Recovery Care/Counseling Consult  Complete Complete -  Palliative Care Screening Not Applicable Not Applicable -  [place] Not Applicable Not Applicable -  Some recent data might be hidden

## 2020-12-08 ENCOUNTER — Telehealth: Payer: Self-pay

## 2020-12-08 ENCOUNTER — Other Ambulatory Visit: Payer: Self-pay | Admitting: Family Medicine

## 2020-12-08 DIAGNOSIS — E1149 Type 2 diabetes mellitus with other diabetic neurological complication: Secondary | ICD-10-CM

## 2020-12-08 DIAGNOSIS — Z794 Long term (current) use of insulin: Secondary | ICD-10-CM

## 2020-12-08 DIAGNOSIS — I110 Hypertensive heart disease with heart failure: Secondary | ICD-10-CM

## 2020-12-08 DIAGNOSIS — E11311 Type 2 diabetes mellitus with unspecified diabetic retinopathy with macular edema: Secondary | ICD-10-CM

## 2020-12-08 NOTE — Telephone Encounter (Signed)
Transition Care Management Unsuccessful Follow-up Telephone Call  Date of discharge and from where:  12/05/2020 from [facility]  Attempts:  1st Attempt  Reason for unsuccessful TCM follow-up call:  Left voice message

## 2020-12-08 NOTE — Telephone Encounter (Signed)
Pt. Has appointment.

## 2020-12-08 NOTE — Telephone Encounter (Signed)
Transition Care Management Unsuccessful Follow-up Telephone Call  Date of discharge and from where:  12/05/2020, [facility]  Attempts:  1st Attempt  Reason for unsuccessful TCM follow-up call:  Left voice message on # [phone] and [phone].  Patient has appointment with Dr Margarita Rana 01/20/2021. Need to inquire if she would like to be seen sooner at one of our other clinics

## 2020-12-09 ENCOUNTER — Other Ambulatory Visit: Payer: Self-pay | Admitting: Family Medicine

## 2020-12-09 ENCOUNTER — Other Ambulatory Visit: Payer: Self-pay | Admitting: Family

## 2020-12-09 DIAGNOSIS — Z794 Long term (current) use of insulin: Secondary | ICD-10-CM

## 2020-12-09 DIAGNOSIS — E1149 Type 2 diabetes mellitus with other diabetic neurological complication: Secondary | ICD-10-CM

## 2020-12-09 MED ORDER — MISC. DEVICES MISC: 0 refills | Status: DC

## 2020-12-09 MED ORDER — ALBUTEROL SULFATE HFA 108 (90 BASE) MCG/ACT IN AERS: 1.0000 | INHALATION_SPRAY | RESPIRATORY_TRACT | 0 refills | Status: DC | PRN

## 2020-12-09 NOTE — Addendum Note (Signed)
Addended byCharlott Rakes on: 12/09/2020 01:21 PM   Modules accepted: Orders

## 2020-12-09 NOTE — Telephone Encounter (Signed)
Requested medication (s) are due for refill today: yes  Requested medication (s) are on the active medication list: yes  Last refill:  11/06/20  Future visit scheduled: yes  Notes to clinic:  not delegated    Requested Prescriptions  Pending Prescriptions Disp Refills   pregabalin (LYRICA) 75 MG capsule [Pharmacy Med Name: pregabalin 75 mg capsule] 60 capsule 4    Sig: TAKE ONE CAPSULE BY MOUTH EVERY MORNING and TAKE ONE CAPSULE BY MOUTH EVERY EVENING      Not Delegated - Neurology:  Anticonvulsants - Controlled Failed - 12/09/2020 11:13 AM      Failed - This refill cannot be delegated      Passed - Valid encounter within last 12 months    Recent Outpatient Visits           6 months ago Type 2 diabetes mellitus with stage 3b chronic kidney disease, with long-term current use of insulin ([place])   [place], [place], MD   10 months ago Type 2 diabetes mellitus with stage 3b chronic kidney disease, with long-term current use of insulin ([place])   [place], Charlane Ferretti, MD   12 months ago No-show for appointment   [place], Amy J, NP   1 year ago 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

## 2020-12-09 NOTE — Telephone Encounter (Signed)
Prescription for shower chair has been done.

## 2020-12-09 NOTE — Telephone Encounter (Signed)
Transition Care Management Follow-up Telephone Call  Date of discharge and from where: 12/05/2020 from [facility]  How have you been since you were released from the hospital? Pt states that she is feeling well. She stated that she just has to take it easy so she doesn't become short of breath.   Any questions or concerns? Yes  Pt stated that she needs a refill of albuterol sent to her pharmacy. It looks like there was an order for a shower in February but patient has not received it yet. There was also an order for a rollator and pt did receive that.    Items Reviewed:  Did the pt receive and understand the discharge instructions provided? Yes   Medications obtained and verified? Yes   Other? No   Any new allergies since your discharge? No   Dietary orders reviewed? n/a  Do you have support at home? Yes   Home Care and Equipment/Supplies: Were any new equipment or medical supplies ordered?  Yes: shower chair (February?) What is the name of the medical supply agency? n/a Were you able to get the supplies/equipment? Shower chair has not been received.  Do you have any questions related to the use of the equipment or supplies? No  Functional Questionnaire: (I = Independent and D = Dependent) ADLs: I  Bathing/Dressing- I  Meal Prep- I  Eating- I  Maintaining continence- I  Transferring/Ambulation- I  Managing Meds- I   Follow up appointments reviewed:   PCP Hospital f/u appt confirmed? Yes  Scheduled to see [place] on 01/20/2021 @ 09:30am.  [facility] f/u appt confirmed? No  Recommendation to see cardiology but pt has not scheduled with them yet.   Are transportation arrangements needed? No   If their condition worsens, is the pt aware to call PCP or go to the Emergency Dept.? Yes  Was the patient provided with contact information for the PCP's office or ED? Yes  Was to pt encouraged to call back with questions or concerns? Yes

## 2020-12-09 NOTE — Telephone Encounter (Signed)
Done

## 2020-12-09 NOTE — Telephone Encounter (Signed)
May patient also have a refill of her albuterol?

## 2020-12-09 NOTE — Addendum Note (Signed)
Addended by: [name] on: 12/09/2020 05:25 PM   Modules accepted: Orders

## 2020-12-10 ENCOUNTER — Telehealth: Payer: Self-pay | Admitting: Family Medicine

## 2020-12-10 ENCOUNTER — Other Ambulatory Visit: Payer: Self-pay

## 2020-12-10 DIAGNOSIS — E1149 Type 2 diabetes mellitus with other diabetic neurological complication: Secondary | ICD-10-CM

## 2020-12-10 DIAGNOSIS — Z794 Long term (current) use of insulin: Secondary | ICD-10-CM

## 2020-12-10 MED ORDER — ALBUTEROL SULFATE HFA 108 (90 BASE) MCG/ACT IN AERS: 1.0000 | INHALATION_SPRAY | RESPIRATORY_TRACT | 0 refills | Status: DC | PRN

## 2020-12-10 MED ORDER — PREGABALIN 75 MG PO CAPS: ORAL_CAPSULE | ORAL | 4 refills | Status: DC

## 2020-12-10 NOTE — Telephone Encounter (Signed)
Pt was called and a VM was left informing patient to return phone call.

## 2020-12-10 NOTE — Telephone Encounter (Signed)
Copied from [facility] [phone]. Topic: General - Other >> Dec 10, 2020 12:55 PM Mcneil, Ja-Kwan wrote: Reason for CRM: Pt insisted on speaking with Dr. Margarita Rana today. Pt stated she went to the hospital and she was given the wrong medication so it is urgent that she speak with Dr. Margarita Rana asap. Pt requests call back at [phone]

## 2020-12-24 ENCOUNTER — Encounter: Payer: Self-pay | Admitting: Emergency Medicine

## 2020-12-24 ENCOUNTER — Other Ambulatory Visit: Payer: Self-pay

## 2020-12-24 ENCOUNTER — Ambulatory Visit: Payer: Self-pay | Admitting: *Deleted

## 2020-12-24 ENCOUNTER — Other Ambulatory Visit: Payer: Self-pay | Admitting: Obstetrics and Gynecology

## 2020-12-24 ENCOUNTER — Emergency Department: Payer: Medicaid Other

## 2020-12-24 DIAGNOSIS — J441 Chronic obstructive pulmonary disease with (acute) exacerbation: Secondary | ICD-10-CM | POA: Diagnosis not present

## 2020-12-24 DIAGNOSIS — J449 Chronic obstructive pulmonary disease, unspecified: Secondary | ICD-10-CM | POA: Insufficient documentation

## 2020-12-24 DIAGNOSIS — Z8616 Personal history of COVID-19: Secondary | ICD-10-CM | POA: Diagnosis not present

## 2020-12-24 DIAGNOSIS — N189 Chronic kidney disease, unspecified: Secondary | ICD-10-CM | POA: Diagnosis not present

## 2020-12-24 DIAGNOSIS — R0602 Shortness of breath: Secondary | ICD-10-CM | POA: Insufficient documentation

## 2020-12-24 DIAGNOSIS — I130 Hypertensive heart and chronic kidney disease with heart failure and stage 1 through stage 4 chronic kidney disease, or unspecified chronic kidney disease: Secondary | ICD-10-CM | POA: Insufficient documentation

## 2020-12-24 DIAGNOSIS — I509 Heart failure, unspecified: Secondary | ICD-10-CM | POA: Insufficient documentation

## 2020-12-24 LAB — BASIC METABOLIC PANEL
Anion gap: 9 (ref 5–15)
BUN: 29 mg/dL — ABNORMAL HIGH (ref 6–20)
CO2: 23 mmol/L (ref 22–32)
Calcium: 8.5 mg/dL — ABNORMAL LOW (ref 8.9–10.3)
Chloride: 109 mmol/L (ref 98–111)
Creatinine, Ser: 1.7 mg/dL — ABNORMAL HIGH (ref 0.44–1.00)
GFR, Estimated: 34 mL/min — ABNORMAL LOW (ref >60)
Glucose, Bld: 226 mg/dL — ABNORMAL HIGH (ref 70–99)
Potassium: 3.4 mmol/L — ABNORMAL LOW (ref 3.5–5.1)
Sodium: 141 mmol/L (ref 135–145)

## 2020-12-24 LAB — CBC
HCT: 29 % — ABNORMAL LOW (ref 36.0–46.0)
Hemoglobin: 9.3 g/dL — ABNORMAL LOW (ref 12.0–15.0)
MCH: 27.9 pg (ref 26.0–34.0)
MCHC: 32.1 g/dL (ref 30.0–36.0)
MCV: 87.1 fL (ref 80.0–100.0)
Platelets: 184 10*3/uL (ref 150–400)
RBC: 3.33 MIL/uL — ABNORMAL LOW (ref 3.87–5.11)
RDW: 14.3 % (ref 11.5–15.5)
WBC: 4.2 10*3/uL (ref 4.0–10.5)
nRBC: 0 % (ref 0.0–0.2)

## 2020-12-24 LAB — BRAIN NATRIURETIC PEPTIDE: B Natriuretic Peptide: 49 pg/mL (ref 0.0–100.0)

## 2020-12-24 LAB — TROPONIN I (HIGH SENSITIVITY): Troponin I (High Sensitivity): 4 ng/L (ref <18)

## 2020-12-24 NOTE — Telephone Encounter (Signed)
FYI patient was advised to go to ED.

## 2020-12-24 NOTE — Patient Instructions (Signed)
Hi [name], thank you for speaking with me today-I hope you feel better.  [name] was given information about Medicaid Managed Care team care coordination services as a part of their [place] Medicaid benefit. [name] [name] verbally consented to engagement with the [facility] Managed Care team.   For questions related to your [facility], please call: [phone] or visit the homepage here: [email or web address]  If you would like to schedule transportation through your [facility], please call the following number at least 2 days in advance of your appointment: [phone].   Call the [facility] at [phone], at any time, 24 hours a day, 7 days a week. If you are in danger or need immediate medical attention call 911.  [name] - following are the goals we discussed in your visit today:  Goals Addressed            This Visit's Progress   . Protect My Health       Timeframe:  Long-Range Goal Priority:  Medium Start Date:          10/29/20                   Expected End Date:      03/26/21               Follow Up Date 01/23/21   - schedule appointment for flu shot - schedule appointment for vaccines needed due to my age or health - schedule recommended health tests (blood work, mammogram, colonoscopy, pap test) - schedule and keep appointment for annual check-up  Update 11/28/20:  Patient to schedule follow up appointment with PCP. Update 12/24/20:  Patient has appointment 01/20/21 with PCP.  Needs to schedule appointment with cardiology and nephrology.    Why is this important?    Screening tests can find diseases early when they are easier to treat.   Your doctor or nurse will talk with you about which tests are important for you.   Getting shots for common diseases like the flu and shingles will help prevent them.          Patient verbalizes understanding of instructions provided today.   The Managed Medicaid care management team will reach out to the patient again over the next 30 days.  The patient has been provided with contact information for the Managed Medicaid care management team and has been advised to call with any health related questions or concerns.   [name] Raider RN, BSN [place]  [facility] Curator - Managed Medicaid High Risk [phone].  Following is a copy of your plan of care:  Patient Care Plan: General Plan of Care (Adult)    Problem Identified: Therapeutic Alliance (General Plan of Care)   Priority: Medium  Onset Date: 05/28/2020    Long-Range Goal: Therapeutic Alliance Established   Start Date: 10/29/2020  Expected End Date: 01/28/2021  This Visit's Progress: On track  Recent Progress: Not on track  Priority: Medium  Note:   Evidence-based guidance:   Avoid value judgments; convey acceptance.   Encourage collaboration with the treatment team.   Establish rapport; develop trust relationship.   Therapist, art.   Provide emotional support; encourage patient to share feelings of anger, fear and anxiety.   Promote self-reliance and autonomy based on age and ability; discourage overprotection.   Use empathy and nonjudgmental, participatory manner.  Problem Identified: Health Promotion or Disease Self-Management (General Plan of Care)   Priority: High  Onset Date: 05/28/2020    Long-Range Goal: Self-Management Plan Developed   Start Date: 10/29/2020  Expected End Date: 01/28/2021  This Visit's Progress: On track  Priority: Medium  Note:   Evidence-based guidance:   Review biopsychosocial determinants of health screens.   Determine level of modifiable health risk.   Assess level of patient activation, level of readiness, importance and confidence to make changes.   Evoke change talk using open-ended  questions, pros and cons, as well as looking forward.   Identify areas where behavior change may lead to improved health.   Partner with patient to develop a robust self-management plan that includes lifestyle factors, such as weight loss, exercise and healthy nutrition, as well as goals specific to disease risks.   Support patient and family/caregiver active participation in decision-making and self-management plan.   Implement additional goals and interventions based on identified risk factors to reduce health risk.   Facilitate advance care planning.   Review need for preventive screening based on age, sex, family history and health history.       Problem Identified: Quality of Life (General Plan of Care)   Priority: Medium  Onset Date: 10/29/2020    Long-Range Goal: Quality of Life Maintained   Start Date: 10/29/2020  Expected End Date: 01/28/2021  This Visit's Progress: Not on track  Recent Progress: On track  Priority: Medium  Note:    CARE PLAN ENTRY Medicaid Managed Care (see longitudinal plan of care for additional care plan information)  Current Barriers:  . Patient states leg swelling-recently hospitalized. Marland Kitchen Update 10/29/20:  Patient states leg swelling improved.  Needs follow up appointments with cardiologist and nephrologist. . Update 11/28/20:  Patient denies any leg swelling-has not seen cardiologist or nephrologist-plans on scheduling appointment with PCP. Marland Kitchen Update 12/24/20:  Patient with an increase in leg swelling and trouble breathing-has used inhaler.  States she called PCP office Friday and Monday and no one has called her back-RNCM called PCP office with patient on the phone for 3 way call-they are unable to see her today and directed her to the ER.  Patient still needs to schedule cardiology and nephrology appointment.  Nurse Case Manager Clinical Goal(s):  Marland Kitchen Over the next 30 days, patient will verbalize understanding of plan for decreased swelling.  Update  06/10/20:  patient states swelling has decreased with compression hose/stockings . Over the next 30 days, patient will work with provider to address needs related to swelling.  Update 06/10/20:  patient was seen by her PCP, Dr. Margarita Rana, 06/03/20. Marland Kitchen Over the next 30 days, patient will schedule follow up appointments with cardiologist and nephrologist. . Update 11/28/20:  Patient has not scheduled appointments with cardiologist or nephrologist yet-plans on scheduling appointment with PCP. Marland Kitchen Over the next 30 days, patient will continue to work with Pharmacist. . Update 11/28/20:  Pharmacy continues to follow. Marland Kitchen Update 12/24/20:  patient with increased leg swelling and SOB, was hospitalized approximately 2 weeks ago.  PCP office recommended patient proceed to ER.  Interventions:  . Inter-disciplinary care team collaboration (see longitudinal plan of care) . Reviewed medications with patient. . Discussed plans with patient for ongoing care management follow up and provided patient with direct contact information for care management team. . Collaboration with Pharmacist for review of medications.  Plan:  . Patient will follow up with provider and fill all prescriptions . RNCM will follow up with  patient within 30 days.

## 2020-12-24 NOTE — ED Triage Notes (Addendum)
Pt arrived via POV with reports of shortness of breath since last Thursday. Pt reports shortness of breath with exertion and at rest. Pt states she has been retaining fluid as well.  Pt had COVID in May 2022. 3+ pitting edema present in BLE, pt has coarse crackles bilaterally

## 2020-12-24 NOTE — Patient Outreach (Signed)
Medicaid Managed Care   Nurse Care Manager Note  12/24/2020 Name:  [name] [name] MRN:  [id number] DOB:  05-27-1961  [name] [name] is an 60 y.o. year old female who is a primary patient of [name], MD.  The [facility] Managed Care Coordination team was consulted for assistance with:    chronic healthcare management needs.  [name] [name] was given information about Medicaid Managed Care Coordination team services today. [name] [name] agreed to services and verbal consent obtained.  Engaged with patient by telephone for follow up visit in response to provider referral for case management and/or care coordination services.   Assessments/Interventions:  Review of past medical history, allergies, medications, health status, including review of consultants reports, laboratory and other test data, was performed as part of comprehensive evaluation and provision of chronic care management services.  SDOH (Social Determinants of Health) assessments and interventions performed:   Care Plan  Allergies  Allergen Reactions  . Lisinopril Swelling    Medications Reviewed Today    Reviewed by [name] Medicus, RN (Registered Nurse) on 12/24/20 at [place] List Status: <None>  Medication Order Taking? Sig Documenting Provider Last Dose Status Informant  albuterol (VENTOLIN HFA) 108 (90 Base) MCG/ACT inhaler [id number]  Inhale 1 puff into the lungs every 4 (four) hours as needed for wheezing or shortness of breath. [name], MD  Active   amLODipine (NORVASC) 10 MG tablet [id number] No Take 1 tablet (10 mg total) by mouth daily. [name], [place] 12/04/2020 AM Active Self  atorvastatin (LIPITOR) 40 MG tablet [id number]  Take 1 tablet (40 mg total) by mouth every morning. Must have HF appt for further refills [name], FNP  Active   blood glucose meter kit and supplies KIT [id number] No Dispense based on patient and insurance preference. Use up to four times daily as directed. (FOR ICD-9  250.00, 250.01). Dustin Flock, MD Taking Active Self  ferrous sulfate 325 (65 FE) MG tablet [id number] No Take 325 mg by mouth daily with breakfast. [provider] 12/04/2020 AM Active Self  furosemide (LASIX) 40 MG tablet [id number] No Take 1 tablet (40 mg total) by mouth 2 (two) times daily. [name], [place] 12/04/2020 AM Expired 12/04/20 2359 Self  guaiFENesin-dextromethorphan (ROBITUSSIN DM) 100-10 MG/5ML syrup [id number] No Take 5 mLs by mouth every 6 (six) hours as needed for cough. Arrien, Jimmy Picket, MD PRN Active Self  hydrALAZINE (APRESOLINE) 25 MG tablet [id number] No Take 25 mg by mouth 3 (three) times daily. [provider] 12/04/2020 AM Active Self  ketoconazole (NIZORAL) 2 % cream [id number] No Apply 1 application topically daily.  Patient not taking: Reported on 12/04/2020   Criselda Peaches, DPM Not Taking Unknown time Active Self  labetalol (NORMODYNE) 300 MG tablet [id number] No Take 1 tablet (300 mg total) by mouth 2 (two) times daily. [name], MD 12/04/2020 AM Active Self           Med Note Brunilda Payor   Thu Dec 04, 2020  6:28 PM)    LANTUS SOLOSTAR 100 UNIT/ML Solostar Pen [id number]  INJECT 60 UNITS SUBCUTANEOUSLY EVERYDAY AT BEDTIME [name], MD  Active   liraglutide (VICTOZA) 18 MG/3ML SOPN [id number] No Inject 1.8 mg into the skin daily. [name], MD 12/03/2020 Unknown time Active Self  Misc. Devices MISC [id number]  Rollator with Seat Shower Chair  Dx- CHF; Duration-lifetime. Weight 241 lbs [name], MD  Active   pregabalin (LYRICA) 75 MG capsule [id number]  TAKE  ONE CAPSULE BY MOUTH EVERY MORNING and TAKE ONE CAPSULE BY MOUTH EVERY EVENING Newlin, Enobong, MD  Active   Vitamin D, Ergocalciferol, (DRISDOL) 1.25 MG (50000 UNIT) CAPS capsule [id number] No Take 1 capsule by mouth once a week. [provider] 12/02/2020 Tomasita Crumble Active Self          Patient Active Problem List   Diagnosis Date Noted  . COPD  exacerbation ([place]) 12/05/2020  . COPD with acute exacerbation ([place]) 12/04/2020  . CKD stage 3 due to type 2 diabetes mellitus ([place]) 12/04/2020  . Anemia of chronic disease 12/04/2020  . Class 3 obesity 07/27/2020  . Acute respiratory disease due to COVID-19 virus 07/25/2020  . Acute hypoxemic respiratory failure due to COVID-19 ([place]) 07/25/2020  . Morbid obesity ([place]) 11/23/2019  . Acute respiratory failure with hypoxia ([place]) 11/06/2019  . Chronic kidney disease, stage 3b ([place]) 11/06/2019  . COPD with acute bronchitis ([place])   . Acute kidney failure ([place]) 08/18/2019  . Left ventricular hypertrophy 07/21/2019  . Educated about COVID-19 virus infection 07/21/2019  . Acute on chronic diastolic heart failure ([place])   . Hypoxia   . Dyspnea on exertion 05/09/2019  . Sleep apnea 05/09/2019  . Hyperglycemia 04/09/2019  . Snoring 03/13/2019  . Symptomatic anemia 08/17/2018  . Acute renal failure (ARF) ([place]) 08/17/2018  . Chronic cystitis 08/03/2018  . Diabetic neuropathy ([place]) 06/16/2017  . Non compliance w medication regimen 04/12/2017  . Family history of colon cancer in mother 11/17/2016  . Dyspareunia in female 11/11/2016  . Screen for colon cancer 11/11/2016  . History of ovarian cyst 11/11/2016  . Chronic hepatitis C without hepatic coma ([place]) 11/09/2016  . Hyperlipidemia 10/07/2016  . Insulin dependent type 2 diabetes mellitus ([place]) 01/29/2014  . Status post intraocular lens implant 11/08/2013  . PCO (posterior capsular opacification) 09/28/2013  . Pseudophakia 09/12/2013  . GERD (gastroesophageal reflux disease) 09/06/2013  . Hypertension 09/06/2013  . BREAST PAIN, BILATERAL 02/19/2010  . ANEMIA, IRON DEFICIENCY 10/03/2009  . LEUKOPENIA, MILD 09/19/2009  . Chest pain 09/19/2009  . LIPOMA 06/18/2009  . 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 TINEA PEDIS 05/07/2009  . SKIN LESION 05/07/2009  . COUGH 05/07/2009  . SUBSTANCE ABUSE, MULTIPLE 02/22/2007  . HCVD (hypertensive  cardiovascular disease) 02/22/2007    Conditions to be addressed/monitored per PCP order:  chronic healthcare management needs, DM2, OSA, CHF, CKD, HTN, GERD, CHF, COPD.  Care Plan : General Plan of Care (Adult)  Updates made by [name] Medicus, RN since 12/24/2020 12:00 AM    Problem: Therapeutic Alliance (General Plan of Care)   Priority: Medium  Onset Date: 05/28/2020    Long-Range Goal: Therapeutic Alliance Established   Start Date: 10/29/2020  Expected End Date: 01/28/2021  This Visit's Progress: On track  Recent Progress: Not on track  Priority: Medium  Note:   Evidence-based guidance:   Avoid value judgments; convey acceptance.   Encourage collaboration with the treatment team.   Establish rapport; develop trust relationship.   Therapist, art.   Provide emotional support; encourage patient to share feelings of anger, fear and anxiety.   Promote self-reliance and autonomy based on age and ability; discourage overprotection.   Use empathy and nonjudgmental, participatory manner.      Problem: Quality of Life (General Plan of Care)   Priority: Medium  Onset Date: 10/29/2020    Long-Range Goal: Quality of Life Maintained   Start Date: 10/29/2020  Expected End Date: 01/28/2021  This Visit's Progress: Not on track  Recent Progress: On track  Priority: Medium  Note:    CARE PLAN ENTRY Medicaid Managed Care (see longitudinal plan of care for additional care plan information)  Current Barriers:  . Patient states leg swelling-recently hospitalized. Marland Kitchen Update 10/29/20:  Patient states leg swelling improved.  Needs follow up appointments with cardiologist and nephrologist. . Update 11/28/20:  Patient denies any leg swelling-has not seen cardiologist or nephrologist-plans on scheduling appointment with PCP. Marland Kitchen Update 12/24/20:  Patient with an increase in leg swelling and trouble breathing-has used inhaler.  States she called PCP office Friday and Monday and no one has  called her back-RNCM called PCP office with patient on the phone for 3 way call-they are unable to see her today and directed her to the ER.  Patient still needs to schedule cardiology and nephrology appointment.  Nurse Case Manager Clinical Goal(s):  Marland Kitchen Over the next 30 days, patient will verbalize understanding of plan for decreased swelling.  Update 06/10/20:  patient states swelling has decreased with compression hose/stockings . Over the next 30 days, patient will work with provider to address needs related to swelling.  Update 06/10/20:  patient was seen by her PCP, Dr. Margarita Rana, 06/03/20. Marland Kitchen Over the next 30 days, patient will schedule follow up appointments with cardiologist and nephrologist. . Update 11/28/20:  Patient has not scheduled appointments with cardiologist or nephrologist yet-plans on scheduling appointment with PCP. Marland Kitchen Over the next 30 days, patient will continue to work with Pharmacist. . Update 11/28/20:  Pharmacy continues to follow. Marland Kitchen Update 12/24/20:  patient with increased leg swelling and SOB, was hospitalized approximately 2 weeks ago.  PCP office recommended patient proceed to ER.  Interventions:  . Inter-disciplinary care team collaboration (see longitudinal plan of care) . Reviewed medications with patient. . Discussed plans with patient for ongoing care management follow up and provided patient with direct contact information for care management team. . Collaboration with Pharmacist for review of medications.  Plan:  . Patient will follow up with provider and fill all prescriptions . RNCM will follow up with patient within 30 days.    Follow Up:  Patient agrees to Care Plan and Follow-up.  Plan: The Managed Medicaid care management team will reach out to the patient again over the next 30 days. and The patient has been provided with contact information for the Managed Medicaid care management team and has been advised to call with any health related questions or  concerns.  Date/time of next scheduled RN care management/care coordination outreach:  01/15/21 at 1030.

## 2020-12-24 NOTE — Telephone Encounter (Signed)
Pt reports both legs swelling "Entire body swelling." Like an elephant. Reports SOB with minimal exertion. Pt sounds SOB on call, speech halting. Evasive historian, case manager 'Jeani Hawking' on phone as well. Advised ED pt states will follow disposition, has driver, care advise given.   Reason for Disposition . [1] Difficulty breathing with exertion (e.g., walking) AND [2] new-onset or worsening  Answer Assessment - Initial Assessment Questions 1. ONSET: "When did the swelling start?" (e.g., minutes, hours, days)     Weekend 2. LOCATION: "What part of the leg is swollen?"  "Are both legs swollen or just one leg?"     Both legs, face all over body 3. SEVERITY: "How bad is the swelling?" (e.g., localized; mild, moderate, severe)  - Localized - small area of swelling localized to one leg  - MILD pedal edema - swelling limited to foot and ankle, pitting edema < 1/4 inch (6 mm) deep, rest and elevation eliminate most or all swelling  - MODERATE edema - swelling of lower leg to knee, pitting edema > 1/4 inch (6 mm) deep, rest and elevation only partially reduce swelling  - SEVERE edema - swelling extends above knee, facial or hand swelling present      Severe 4. REDNESS: "Does the swelling look red or infected?"     no 5. PAIN: "Is the swelling painful to touch?" If Yes, ask: "How painful is it?"   (Scale 1-10; mild, moderate or severe)      6. FEVER: "Do you have a fever?" If Yes, ask: "What is it, how was it measured, and when did it start?"       7. CAUSE: "What do you think is causing the leg swelling?"      8. MEDICAL HISTORY: "Do you have a history of heart failure, kidney disease, liver failure, or cancer?"      9. RECURRENT SYMPTOM: "Have you had leg swelling before?" If Yes, ask: "When was the last time?" "What happened that time?"     Yes, off and on 10. OTHER SYMPTOMS: "Do you have any other symptoms?" (e.g., chest pain, difficulty breathing)      SOB, with minimal exertion  Protocols  used: LEG SWELLING AND EDEMA-A-AH

## 2020-12-25 ENCOUNTER — Emergency Department
Admission: EM | Admit: 2020-12-25 | Discharge: 2020-12-25 | Disposition: A | Discharge status: Home / Self Care | Payer: Medicaid Other | Attending: Emergency Medicine | Admitting: Emergency Medicine

## 2020-12-25 DIAGNOSIS — J441 Chronic obstructive pulmonary disease with (acute) exacerbation: Secondary | ICD-10-CM

## 2020-12-25 DIAGNOSIS — I509 Heart failure, unspecified: Secondary | ICD-10-CM

## 2020-12-25 LAB — TROPONIN I (HIGH SENSITIVITY): Troponin I (High Sensitivity): 4 ng/L (ref <18)

## 2020-12-25 MED ORDER — METHYLPREDNISOLONE SODIUM SUCC 125 MG IJ SOLR
125.0000 mg | Freq: Once | INTRAMUSCULAR | Status: AC
  Administered 2020-12-25: 125 mg via INTRAVENOUS
  Filled 2020-12-25: qty 2

## 2020-12-25 MED ORDER — ALBUTEROL SULFATE (2.5 MG/3ML) 0.083% IN NEBU: 2.5000 mg | INHALATION_SOLUTION | RESPIRATORY_TRACT | 0 refills | Status: DC | PRN

## 2020-12-25 MED ORDER — PREDNISONE 20 MG PO TABS: ORAL_TABLET | ORAL | 0 refills | Status: DC

## 2020-12-25 MED ORDER — COMPRESSOR/NEBULIZER MISC: 1.0000 [IU] | 0 refills | Status: DC | PRN

## 2020-12-25 MED ORDER — FUROSEMIDE 10 MG/ML IJ SOLN
40.0000 mg | Freq: Once | INTRAMUSCULAR | Status: AC
  Administered 2020-12-25: 40 mg via INTRAVENOUS
  Filled 2020-12-25: qty 4

## 2020-12-25 MED ORDER — IPRATROPIUM-ALBUTEROL 0.5-2.5 (3) MG/3ML IN SOLN
3.0000 mL | Freq: Once | RESPIRATORY_TRACT | Status: AC
  Administered 2020-12-25: 3 mL via RESPIRATORY_TRACT
  Filled 2020-12-25: qty 3

## 2020-12-25 NOTE — Discharge Instructions (Addendum)
1.  Finish Prednisone 60 mg daily x4 days. 2.  You may use Albuterol nebulizer every 4 hours as needed for difficulty breathing. 3.  Return to the ER for worsening symptoms, persistent vomiting, difficulty breathing or other concerns

## 2020-12-25 NOTE — ED Provider Notes (Signed)
-----------------------------------------   12:52 AM on 12/25/2020 -----------------------------------------  Patient seen during Epic downtime.  Please see paper chart for H&P.   Paulette Blanch, MD 12/25/20 [phone]

## 2020-12-25 NOTE — ED Notes (Signed)
Ambulatory pulse ox complete at this time. Patient sats maintained between 92-95% on RA during ambulation. Patient tachypnic and labored after walking with frequent coughing noted. Per patient, this is not baseline. Provider aware.

## 2020-12-26 ENCOUNTER — Telehealth: Payer: Self-pay | Admitting: *Deleted

## 2020-12-26 NOTE — Telephone Encounter (Signed)
Transition Care Management Unsuccessful Follow-up Telephone Call  Date of discharge and from where:  12/25/2020 [facility] ED  Attempts:  1st Attempt  Reason for unsuccessful TCM follow-up call:  Unable to leave message

## 2020-12-29 ENCOUNTER — Other Ambulatory Visit: Payer: Self-pay | Admitting: Family Medicine

## 2020-12-29 DIAGNOSIS — I110 Hypertensive heart disease with heart failure: Secondary | ICD-10-CM

## 2020-12-29 NOTE — Telephone Encounter (Signed)
Transition Care Management Unsuccessful Follow-up Telephone Call  Date of discharge and from where:  12/25/2020 [facility] ED  Attempts:  2nd Attempt  Reason for unsuccessful TCM follow-up call:  Unable to leave message

## 2020-12-29 NOTE — Telephone Encounter (Signed)
  Notes to clinic: Patient has appt on 7/5/20222 Review for refill  Patient should have enough medication until appt   Requested Prescriptions  Pending Prescriptions Disp Refills   labetalol (NORMODYNE) 300 MG tablet [Pharmacy Med Name: labetalol 300 mg tablet] 60 tablet 3    Sig: TAKE ONE TABLET BY MOUTH EVERY MORNING and TAKE ONE TABLET BY MOUTH EVERY EVENING      Cardiovascular:  Beta Blockers Failed - 12/29/2020  8:04 AM      Failed - Last BP in normal range    BP Readings from Last 1 Encounters:  12/25/20 (!) 162/87          Failed - Valid encounter within last 6 months    Recent Outpatient Visits           6 months ago Type 2 diabetes mellitus with stage 3b chronic kidney disease, with long-term current use of insulin ([place])   [place], [place], MD   11 months ago Type 2 diabetes mellitus with stage 3b chronic kidney disease, with long-term current use of insulin ([place])   [facility] [place], Charlane Ferretti, MD   1 year ago No-show for appointment   [place], [place] J, NP   1 year ago 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

## 2020-12-30 NOTE — Telephone Encounter (Signed)
Transition Care Management Follow-up Telephone Call Date of discharge and from where: 12/25/2020 [facility] ED How have you been since you were released from the hospital? "A little better" Any questions or concerns? No  Items Reviewed: Did the pt receive and understand the discharge instructions provided? Yes  Medications obtained and verified? Yes  Other? No  Any new allergies since your discharge? No  Dietary orders reviewed? No Do you have support at home? Yes    Functional Questionnaire: (I = Independent and D = Dependent) ADLs: I  Bathing/Dressing- I  Meal Prep- I  Eating- I  Maintaining continence- I  Transferring/Ambulation- I  Managing Meds- I  Follow up appointments reviewed:  PCP Hospital f/u appt confirmed? Yes  Scheduled to see Dr. Margarita Rana on 01/20/2021 @ 0930. [place] [facility] f/u appt confirmed? No   Are transportation arrangements needed? No  If their condition worsens, is the pt aware to call PCP or go to the Emergency Dept.? Yes Was the patient provided with contact information for the PCP's office or ED? Yes Was to pt encouraged to call back with questions or concerns? Yes

## 2021-01-01 ENCOUNTER — Other Ambulatory Visit: Payer: Medicaid Other

## 2021-01-01 NOTE — Patient Outreach (Signed)
Patient recently hospitalized. Due for meds on 01/07/21 but wants to push back to 01/22/21  Labetalol   300 mg  1  1  Lyrica   75 mg  1  1  Ferrous Sulfate   325 mg  1  1  Hydralazine   25 mg  1 1 1   Amlodipine   10 mg  1    Atorvastatin   40 mg  1    Furosemide   40 mg  1  1  Lantus Solostar          Also, patient states she's confused and doesn't know what meds are for, asked for me to go over each medication. Will do so next week

## 2021-01-08 ENCOUNTER — Other Ambulatory Visit: Payer: Self-pay

## 2021-01-08 NOTE — Patient Instructions (Signed)
Visit Information  [name] [name] was given information about Medicaid Managed Care team care coordination services as a part of their [place] Medicaid benefit. [name] [name] verbally consented to engagement with the [facility] Managed Care team.   For questions related to your [facility], please call: [phone] or visit the homepage here: [email or web address]  If you would like to schedule transportation through your [facility], please call the following number at least 2 days in advance of your appointment: [phone].   Call the [facility] at [phone], at any time, 24 hours a day, 7 days a week. If you are in danger or need immediate medical attention call 911.  [name] [name] - following are the goals we discussed in your visit today:   Goals Addressed   None     Please see education materials related to DM provided as print materials.   Patient verbalizes understanding of instructions provided today.   The patient has been provided with contact information for the Managed Medicaid care management team and has been advised to call with any health related questions or concerns.   [name] [name], Pharm.D., Managed Medicaid Pharmacist [phone]   Following is a copy of your plan of care:  Patient Care Plan: General Plan of Care (Adult)     Problem Identified: Therapeutic Alliance (General Plan of Care)   Priority: Medium  Onset Date: 05/28/2020     Long-Range Goal: Therapeutic Alliance Established   Start Date: 10/29/2020  Expected End Date: 01/28/2021  This Visit's Progress: On track  Recent Progress: Not on track  Priority: Medium  Note:   Evidence-based guidance:  Avoid value judgments; convey acceptance.  Encourage collaboration with the treatment team.  Establish rapport; develop trust relationship.  Systems analyst.  Provide emotional support; encourage patient to share feelings of anger, fear and anxiety.  Promote self-reliance and autonomy based on age and ability; discourage overprotection.  Use empathy and nonjudgmental, participatory manner.       Task: Develop Relationship to Effect Behavior Change   Note:   Care Management Activities:    - verbalization of feelings encouraged    Notes:    Problem Identified: Health Promotion or Disease Self-Management (General Plan of Care)   Priority: High  Onset Date: 05/28/2020     Long-Range Goal: Self-Management Plan Developed   Start Date: 10/29/2020  Expected End Date: 01/28/2021  This Visit's Progress: On track  Priority: Medium  Note:   Evidence-based guidance:  Review biopsychosocial determinants of health screens.  Determine level of modifiable health risk.  Assess level of patient activation, level of readiness, importance and confidence to make changes.  Evoke change talk using open-ended questions, pros and cons, as well as looking forward.  Identify areas where behavior change may lead to improved health.  Partner with patient to develop a robust self-management plan that includes lifestyle factors, such as weight loss, exercise and healthy nutrition, as well as goals specific to disease risks.  Support patient and family/caregiver active participation in decision-making and self-management plan.  Implement additional goals and interventions based on identified risk factors to reduce health risk.  Facilitate advance care planning.  Review need for preventive screening based on age, sex, family history and health history.       Task: Mutually Develop and [name] [name] Achievement of Patient Goals   Note:   Care Management Activities:    - verbalization of feelings encouraged  Notes:    Problem Identified: Coping Skills (General Plan of Care)   Priority: High  Onset Date: 10/29/2020     Long-Range Goal: Coping  Skills Enhanced   Start Date: 10/29/2020  Expected End Date: 01/28/2021  This Visit's Progress: On track  Priority: Medium  Note:   Evidence-based guidance:  Acknowledge, normalize and validate difficulty of making life-long lifestyle changes.  Identify current effective and ineffective coping strategies.  Encourage patient and caregiver participation in care to increase self-esteem, confidence and feelings of control.  Consider alternative and complementary therapy approaches such as meditation, mindfulness or yoga.  Encourage participation in cognitive behavioral therapy to foster a positive identity, increase self-awareness, as well as bolster self-esteem, confidence and self-efficacy.  Discuss spirituality; be present as concerns are identified; encourage journaling, prayer, worship services, meditation or pastoral counseling.  Encourage participation in pleasurable group activities such as hobbies, singing, sports or volunteering).  Encourage the use of mindfulness; refer for training or intensive intervention.  Consider the use of meditative movement therapy such as tai chi, yoga or qigong.  Promote a regular daily exercise program based on tolerance, ability and patient choice to support positive thinking about disease or aging.       Task: Support Psychosocial Response to Risk or Actual Health Condition   Note:   Care Management Activities:    - verbalization of feelings encouraged    Notes:    Problem Identified: Quality of Life (General Plan of Care)   Priority: Medium  Onset Date: 10/29/2020     Long-Range Goal: Quality of Life Maintained   Start Date: 10/29/2020  Expected End Date: 01/28/2021  This Visit's Progress: Not on track  Recent Progress: On track  Priority: Medium  Note:    CARE PLAN ENTRY Medicaid Managed Care (see longitudinal plan of care for additional care plan information)  Current Barriers:  Patient states leg swelling-recently hospitalized. Update  10/29/20:  Patient states leg swelling improved.  Needs follow up appointments with cardiologist and nephrologist. Update 11/28/20:  Patient denies any leg swelling-has not seen cardiologist or nephrologist-plans on scheduling appointment with PCP. Update 12/24/20:  Patient with an increase in leg swelling and trouble breathing-has used inhaler.  States she called PCP office Friday and Monday and no one has called her back-RNCM called PCP office with patient on the phone for 3 way call-they are unable to see her today and directed her to the ER.  Patient still needs to schedule cardiology and nephrology appointment.  Nurse Case Manager Clinical Goal(s):  Over the next 30 days, patient will verbalize understanding of plan for decreased swelling.  Update 06/10/20:  patient states swelling has decreased with compression hose/stockings Over the next 30 days, patient will work with provider to address needs related to swelling.  Update 06/10/20:  patient was seen by her PCP, Dr. Margarita Rana, 06/03/20. Over the next 30 days, patient will schedule follow up appointments with cardiologist and nephrologist. Update 11/28/20:  Patient has not scheduled appointments with cardiologist or nephrologist yet-plans on scheduling appointment with PCP. Over the next 30 days, patient will continue to work with Pharmacist. Update 11/28/20:  Pharmacy continues to follow. Update 12/24/20:  patient with increased leg swelling and SOB, was hospitalized approximately 2 weeks ago.  PCP office recommended patient proceed to ER.  Interventions:  Inter-disciplinary care team collaboration (see longitudinal plan of care) Reviewed medications with patient. Discussed plans with patient for ongoing care management follow up and provided patient with direct contact information for  care management team. Collaboration with Pharmacist for review of medications.  Plan:  Patient will follow up with provider and fill all prescriptions RNCM will  follow up with patient within 30 days.    Evidence-based guidance:  Assess patient's thoughts about quality of life, goals and expectations, and dissatisfaction or desire to improve.  Identify issues of primary importance such as mental health, illness, exercise tolerance, pain, sexual function and intimacy, cognitive change, social isolation, finances and relationships.  Assess and monitor for signs/symptoms of psychosocial concerns, especially depression or ideations regarding harm to others or self; provide or refer for mental health services as needed.  Identify sensory issues that impact quality of life such as hearing loss, vision deficit; strategize ways to maintain or improve hearing, vision.  Promote access to services in the community to support independence such as support groups, home visiting programs, financial assistance, handicapped parking tags, durable medical equipment and emergency responder.  Promote activities to decrease social isolation such as group support or social, leisure and recreational activities, employment, use of social media; consider safety concerns about being out of home for activities.  Provide patient an opportunity to share by storytelling or a "life review" to give positive meaning to life and to assist with coping and negative experiences.  Encourage patient to tap into hope to improve sense of self.  Counsel based on prognosis and as early as possible about end-of-life and palliative care; consider referral to palliative care provider.  Advocate for the development of palliative care plan that may include avoidance of unnecessary testing and intervention, symptom control, discontinuation of medications, hospice and organ donation.  Counsel as early as possible those with life-limiting chronic disease about palliative care; consider referral to palliative care provider.  Advocate for the development of palliative care plan.       Task: Support and  Maintain Acceptable Degree of Health, Comfort and Happiness   Note:   Care Management Activities:    - wellness behaviors promoted    Notes:    Patient Care Plan: General Pharmacy (Adult)     Problem Identified: Health Promotion or Disease Self-Management (General Plan of Care)      Goal: Self-Management Plan Developed   Note:   Evidence-based guidance:  Review biopsychosocial determinants of health screens.  Determine level of modifiable health risk.  Assess level of patient activation, level of readiness, importance and confidence to make changes.  Evoke change talk using open-ended questions, pros and cons, as well as looking forward.  Identify areas where behavior change may lead to improved health.  Partner with patient to develop a robust self-management plan that includes lifestyle factors, such as weight loss, exercise and healthy nutrition, as well as goals specific to disease risks.  Support patient and family/caregiver active participation in decision-making and self-management plan.  Implement additional goals and interventions based on identified risk factors to reduce health risk.  Facilitate advance care planning.  Review need for preventive screening based on age, sex, family history and health history.   Notes:     Task: Medication Management   Note:   Current Barriers:  Does not adhere to prescribed medication regimen Can't remember to take medications during the day Has difficulty picking up medications from Pharmacy Doesn't have BP Cuff Patient self described, "Bad at reading" Doesn't understand what her medications are for or what they do  Pharmacist Clinical Goal(s):  Over the next 30 days, patient will achieve adherence to monitoring guidelines and medication adherence to achieve  therapeutic efficacy through collaboration with PharmD and provider.    Interventions: Inter-disciplinary care team collaboration (see longitudinal plan of  care) Comprehensive medication review performed; medication list updated in electronic medical record  Verbal consent obtained for [facility] enhanced pharmacy services (medication synchronization, adherence packaging, delivery coordination). A medication sync plan was created to allow patient to get all medications delivered once every 30 to 90 days per patient preference. Patient understands they have freedom to choose pharmacy and clinical pharmacist will coordinate care between all prescribers and [facility].   _0 @ _1 @ _2 @  Patient Goals/Self-Care Activities Over the next 30 days, patient will:  - take medications as prescribed check glucose Daily, document, and provide at future appointments check blood pressure Daily, document, and provide at future appointments  Follow Up Plan: The care management team will reach out to the patient again over the next 30 days.

## 2021-01-08 NOTE — Patient Outreach (Signed)
Medicaid Managed Care    Pharmacy Note  01/08/2021 Name: [name] [name] MRN: [id number] DOB: 06/20/1961  [name] [name] is a 60 y.o. year old female who is a primary care patient of [name], MD. The [facility] Managed Care Coordination team was consulted for assistance with disease management and care coordination needs.    Engaged with patient Engaged with patient by telephone for follow up visit in response to referral for case management and/or care coordination services.  [name] was given information about Managed Medicaid Care Coordination team services today. Tamala Ser agreed to services and verbal consent obtained.   Objective:  Lab Results  Component Value Date   CREATININE 1.70 (H) 12/24/2020   CREATININE 1.82 (H) 12/05/2020   CREATININE 1.75 (H) 12/04/2020    Lab Results  Component Value Date   HGBA1C 7.6 (H) 12/05/2020       Component Value Date/Time   CHOL 147 01/29/2019 1118   TRIG 102 01/29/2019 1118   HDL 59 01/29/2019 1118   CHOLHDL 2.5 01/29/2019 1118   CHOLHDL 3 06/18/2014 0951   VLDL 19.6 06/18/2014 0951   LDLCALC 68 01/29/2019 1118    Other: (TSH, CBC, Vit D, etc.)  Clinical ASCVD: Yes  The 10-year ASCVD risk score [name], et al., 2013) is: 20.8%   Values used to calculate the score:     Age: 2 years     Sex: Female     Is Non-Hispanic African American: Yes     Diabetic: Yes     Tobacco smoker: No     Systolic Blood Pressure: 657 mmHg     Is BP treated: Yes     HDL Cholesterol: 59 mg/dL     Total Cholesterol: 147 mg/dL    Other: (CHADS2VASc if Afib, PHQ9 if depression, MMRC or CAT for COPD, ACT, DEXA)  BP Readings from Last 3 Encounters:  12/25/20 (!) 162/87  12/05/20 (!) 173/81  07/27/20 (!) 122/97    Assessment/Interventions: Review of patient past medical history, allergies, medications, health status, including review of consultants reports, laboratory and other test data, was performed as part of  comprehensive evaluation and provision of chronic care management services.     Cholesterol Lab Results  Component Value Date   CHOL 147 01/29/2019   CHOL 158 08/16/2017   CHOL 172 11/08/2016   Lab Results  Component Value Date   HDL 59 01/29/2019   HDL 65 08/16/2017   HDL 80 11/08/2016   Lab Results  Component Value Date   LDLCALC 68 01/29/2019   LDLCALC 74 08/16/2017   LDLCALC 72 11/08/2016   Lab Results  Component Value Date   TRIG 102 01/29/2019   TRIG 93 08/16/2017   TRIG 100 11/08/2016   Lab Results  Component Value Date   CHOLHDL 2.5 01/29/2019   CHOLHDL 2.4 08/16/2017   CHOLHDL 2.2 11/08/2016   No results found for: LDLDIRECT  Atorvastatin 21m Jan 2022 Plan: At goal,  patient stable/ symptoms controlled. Patient is self described, "Bad at reading" and admitted she doesn't understand what this disease state means. She stated, "It's hard to remember to take something when you don't know what it's for." I explained in depth, using various metaphors. June 2022: Patient had questions about what medication was, how to pronounce it, and how it works. Explained using various metaphors   Edema Furosemide -Wears stockings, states they hurt when she wears. 3/7 days she wears them June 2022: Patient had questions about what medication was,  how to pronounce it, and how it works. Explained using various metaphors    DM Lab Results  Component Value Date   HGBA1C 6.3 06/03/2020   HGBA1C 6.8 01/30/2020   HGBA1C 10.0 (H) 08/18/2019   Lab Results  Component Value Date   MICROALBUR 4.17 (H) 06/30/2010   LDLCALC 68 01/29/2019   CREATININE 1.41 (H) 05/27/2020    Lab Results  Component Value Date   NA 141 05/27/2020   K 3.7 05/27/2020   CREATININE 1.41 (H) 05/27/2020   GFRNONAA 43 (L) 05/27/2020   GFRAA 38 (L) 11/08/2019   GLUCOSE 91 05/27/2020    ASA 81mg Lantus 60units QD (Patient doing 65) Liraglutide 18mg (Victoza) -sugars: 90-100's per patient Jan  2022 Plan: At goal,  patient stable/ symptoms controlled.  Patient is self described, "Bad at reading" and admitted she doesn't understand what this disease state means. She stated, "It's hard to remember to take something when you don't know what it's for." I explained in depth, using various metaphors. Patient agreed to start testing sugars consistently June 2022: Patient had questions about what medication was, how to pronounce it, and how it works. Explained using various metaphors   Cardio BP Readings from Last 3 Encounters:  12/25/20 (!) 162/87  12/05/20 (!) 173/81  07/27/20 (!) 122/97   Hydralazine 100mg TID Labetalol 300mg BID (Non compliant) Amlodipine 10mg December 2021 Plan: BP not at goal but could be nervous. Patient will get BP monitor in next month to get more accurate readings Jan 2022: Patient hasn't gotten BP machine yet. Per Pharmacy, is non-compliant on Labetalol. Swelling could be due to Amlodipine (And renal issues). Will ask PCP about possibly starting ARB (PT allergic to ACE).  Patient is self described, "Bad at reading" and admitted she doesn't understand what this disease state means. She stated, "It's hard to remember to take something when you don't know what it's for." I explained in depth, using various metaphors. June 2022: Patient had questions about what medication was, how to pronounce it, and how it works. Explained using various metaphors   Pain 06/30/20: No meds, 8-9/10 With meds, 0/10 01/08/21: No meds: 8/10 With meds: 0/10 Feet/Hands Nerve pain Pregabalin 75mg BID June 2022: Patient had questions about what medication was, how to pronounce it, and how it works. Explained using various metaphors  SDOH (Social Determinants of Health) assessments and interventions performed:    Care Plan  Allergies  Allergen Reactions   Lisinopril Swelling    Medications Reviewed Today     Reviewed by Craft, Terri G, RN (Registered Nurse) on 12/24/20 at 1559   Med List Status: <None>   Medication Order Taking? Sig Documenting Provider Last Dose Status Informant  albuterol (VENTOLIN HFA) 108 (90 Base) MCG/ACT inhaler [id number]  Inhale 1 puff into the lungs every 4 (four) hours as needed for wheezing or shortness of breath. Newlin, Enobong, MD  Active   amLODipine (NORVASC) 10 MG tablet [id number] No Take 1 tablet (10 mg total) by mouth daily. Hackney, Tina A, FNP 12/04/2020 AM Active Self  atorvastatin (LIPITOR) 40 MG tablet [id number]  Take 1 tablet (40 mg total) by mouth every morning. Must have HF appt for further refills Hackney, Tina A, FNP  Active   blood glucose meter kit and supplies KIT [id number] No Dispense based on patient and insurance preference. Use up to four times daily as directed. (FOR ICD-9 250.00, 250.01). Patel, Shreyang, MD Taking Active Self  ferrous sulfate 325 (65 FE) MG tablet [id number]   No Take 325 mg by mouth daily with breakfast. [provider] 12/04/2020 AM Active Self  furosemide (LASIX) 40 MG tablet [id number] No Take 1 tablet (40 mg total) by mouth 2 (two) times daily. Hackney, Tina A, FNP 12/04/2020 AM Expired 12/04/20 2359 Self  guaiFENesin-dextromethorphan (ROBITUSSIN DM) 100-10 MG/5ML syrup [id number] No Take 5 mLs by mouth every 6 (six) hours as needed for cough. Arrien, Mauricio Daniel, MD PRN Active Self  hydrALAZINE (APRESOLINE) 25 MG tablet [id number] No Take 25 mg by mouth 3 (three) times daily. [provider] 12/04/2020 AM Active Self  ketoconazole (NIZORAL) 2 % cream [id number] No Apply 1 application topically daily.  Patient not taking: Reported on 12/04/2020   McDonald, Adam R, DPM Not Taking Unknown time Active Self  labetalol (NORMODYNE) 300 MG tablet [id number] No Take 1 tablet (300 mg total) by mouth 2 (two) times daily. Newlin, Enobong, MD 12/04/2020 AM Active Self           Med Note (STEWART, GARY M   Thu Dec 04, 2020  6:28 PM)    LANTUS SOLOSTAR 100 UNIT/ML Solostar Pen [id number]  INJECT 60 UNITS  SUBCUTANEOUSLY EVERYDAY AT BEDTIME Newlin, Enobong, MD  Active   liraglutide (VICTOZA) 18 MG/3ML SOPN [id number] No Inject 1.8 mg into the skin daily. Newlin, Enobong, MD 12/03/2020 Unknown time Active Self  Misc. Devices MISC [id number]  Rollator with Seat Shower Chair  Dx- CHF; Duration-lifetime. Weight 241 lbs Newlin, Enobong, MD  Active   pregabalin (LYRICA) 75 MG capsule [id number]  TAKE ONE CAPSULE BY MOUTH EVERY MORNING and TAKE ONE CAPSULE BY MOUTH EVERY EVENING Newlin, Enobong, MD  Active   Vitamin D, Ergocalciferol, (DRISDOL) 1.25 MG (50000 UNIT) CAPS capsule [id number] No Take 1 capsule by mouth once a week. [provider] 12/02/2020 UNK Active Self            Patient Active Problem List   Diagnosis Date Noted   COPD exacerbation (HCC) 12/05/2020   COPD with acute exacerbation (HCC) 12/04/2020   CKD stage 3 due to type 2 diabetes mellitus (HCC) 12/04/2020   Anemia of chronic disease 12/04/2020   Class 3 obesity 07/27/2020   Acute respiratory disease due to COVID-19 virus 07/25/2020   Acute hypoxemic respiratory failure due to COVID-19 (HCC) 07/25/2020   Morbid obesity (HCC) 11/23/2019   Acute respiratory failure with hypoxia (HCC) 11/06/2019   Chronic kidney disease, stage 3b (HCC) 11/06/2019   COPD with acute bronchitis (HCC)    Acute kidney failure (HCC) 08/18/2019   Left ventricular hypertrophy 07/21/2019   Educated about COVID-19 virus infection 07/21/2019   Acute on chronic diastolic heart failure (HCC)    Hypoxia    Dyspnea on exertion 05/09/2019   Sleep apnea 05/09/2019   Hyperglycemia 04/09/2019   Snoring 03/13/2019   Symptomatic anemia 08/17/2018   Acute renal failure (ARF) (HCC) 08/17/2018   Chronic cystitis 08/03/2018   Diabetic neuropathy (HCC) 06/16/2017   Non compliance w medication regimen 04/12/2017   Family history of colon cancer in mother 11/17/2016   Dyspareunia in female 11/11/2016   Screen for colon cancer 11/11/2016   History of  ovarian cyst 11/11/2016   Chronic hepatitis C without hepatic coma (HCC) 11/09/2016   Hyperlipidemia 10/07/2016   Insulin dependent type 2 diabetes mellitus (HCC) 01/29/2014   Status post intraocular lens implant 11/08/2013   PCO (posterior capsular opacification) 09/28/2013   Pseudophakia 09/12/2013   GERD (gastroesophageal reflux disease) 09/06/2013   Hypertension 09/06/2013   BREAST PAIN, BILATERAL   02/19/2010   ANEMIA, IRON DEFICIENCY 10/03/2009   LEUKOPENIA, MILD 09/19/2009   Chest pain 09/19/2009   LIPOMA 06/18/2009   EUSTACHIAN TUBE DYSFUNCTION 06/18/2009   TINEA PEDIS 05/07/2009   SKIN LESION 05/07/2009   COUGH 05/07/2009   SUBSTANCE ABUSE, MULTIPLE 02/22/2007   HCVD (hypertensive cardiovascular disease) 02/22/2007    Conditions to be addressed/monitored: HTN, HLD and DM  Patient Care Plan: General Plan of Care (Adult)     Problem Identified: Therapeutic Alliance (General Plan of Care)      Goal: Therapeutic Alliance Established   Note:   Evidence-based guidance:  Avoid value judgments; convey acceptance.  Encourage collaboration with the treatment team.  Establish rapport; develop trust relationship.  Honor confidentiality.  Provide emotional support; encourage patient to share feelings of anger, fear and anxiety.  Promote self-reliance and autonomy based on age and ability; discourage overprotection.  Use empathy and nonjudgmental, participatory manner.   Notes:    Task: Develop Relationship to Effect Behavior Change   Note:   Care Management Activities:        Notes:    Problem Identified: Health Promotion or Disease Self-Management (General Plan of Care)      Goal: Self-Management Plan Developed   Note:   Evidence-based guidance:  Review biopsychosocial determinants of health screens.  Determine level of modifiable health risk.  Assess level of patient activation, level of readiness, importance and confidence to make changes.  Evoke change talk  using open-ended questions, pros and cons, as well as looking forward.  Identify areas where behavior change may lead to improved health.  Partner with patient to develop a robust self-management plan that includes lifestyle factors, such as weight loss, exercise and healthy nutrition, as well as goals specific to disease risks.  Support patient and family/caregiver active participation in decision-making and self-management plan.  Implement additional goals and interventions based on identified risk factors to reduce health risk.  Facilitate advance care planning.  Review need for preventive screening based on age, sex, family history and health history.   Notes:    Task: Mutually Develop and Foster Achievement of Patient Goals   Note:   Care Management Activities:        Notes:    Problem Identified: Coping Skills (General Plan of Care)      Goal: Coping Skills Enhanced   Note:   Evidence-based guidance:  Acknowledge, normalize and validate difficulty of making life-long lifestyle changes.  Identify current effective and ineffective coping strategies.  Encourage patient and caregiver participation in care to increase self-esteem, confidence and feelings of control.  Consider alternative and complementary therapy approaches such as meditation, mindfulness or yoga.  Encourage participation in cognitive behavioral therapy to foster a positive identity, increase self-awareness, as well as bolster self-esteem, confidence and self-efficacy.  Discuss spirituality; be present as concerns are identified; encourage journaling, prayer, worship services, meditation or pastoral counseling.  Encourage participation in pleasurable group activities such as hobbies, singing, sports or volunteering).  Encourage the use of mindfulness; refer for training or intensive intervention.  Consider the use of meditative movement therapy such as tai chi, yoga or qigong.  Promote a regular daily exercise  program based on tolerance, ability and patient choice to support positive thinking about disease or aging.   Notes:    Task: Support Psychosocial Response to Risk or Actual Health Condition   Note:   Care Management Activities:        Notes:    Problem Identified: Quality of Life (General   Plan of Care)      Goal: Quality of Life Maintained   Note:   Evidence-based guidance:  Assess patient's thoughts about quality of life, goals and expectations, and dissatisfaction or desire to improve.  Identify issues of primary importance such as mental health, illness, exercise tolerance, pain, sexual function and intimacy, cognitive change, social isolation, finances and relationships.  Assess and monitor for signs/symptoms of psychosocial concerns, especially depression or ideations regarding harm to others or self; provide or refer for mental health services as needed.  Identify sensory issues that impact quality of life such as hearing loss, vision deficit; strategize ways to maintain or improve hearing, vision.  Promote access to services in the community to support independence such as support groups, home visiting programs, financial assistance, handicapped parking tags, durable medical equipment and emergency responder.  Promote activities to decrease social isolation such as group support or social, leisure and recreational activities, employment, use of social media; consider safety concerns about being out of home for activities.  Provide patient an opportunity to share by storytelling or a "life review" to give positive meaning to life and to assist with coping and negative experiences.  Encourage patient to tap into hope to improve sense of self.  Counsel based on prognosis and as early as possible about end-of-life and palliative care; consider referral to palliative care provider.  Advocate for the development of palliative care plan that may include avoidance of unnecessary testing and  intervention, symptom control, discontinuation of medications, hospice and organ donation.  Counsel as early as possible those with life-limiting chronic disease about palliative care; consider referral to palliative care provider.  Advocate for the development of palliative care plan.   Notes:    Task: Support and Maintain Acceptable Degree of Health, Comfort and Happiness   Note:   Care Management Activities:        Notes:    Patient Care Plan: General Pharmacy (Adult)     Problem Identified: Health Promotion or Disease Self-Management (General Plan of Care)      Goal: Self-Management Plan Developed   Note:   Evidence-based guidance:  Review biopsychosocial determinants of health screens.  Determine level of modifiable health risk.  Assess level of patient activation, level of readiness, importance and confidence to make changes.  Evoke change talk using open-ended questions, pros and cons, as well as looking forward.  Identify areas where behavior change may lead to improved health.  Partner with patient to develop a robust self-management plan that includes lifestyle factors, such as weight loss, exercise and healthy nutrition, as well as goals specific to disease risks.  Support patient and family/caregiver active participation in decision-making and self-management plan.  Implement additional goals and interventions based on identified risk factors to reduce health risk.  Facilitate advance care planning.  Review need for preventive screening based on age, sex, family history and health history.   Notes:     Task: Medication Management   Note:   Current Barriers:  Does not adhere to prescribed medication regimen Can't remember to take medications during the day Has difficulty picking up medications from Pharmacy Doesn't have BP Cuff  Pharmacist Clinical Goal(s):  Over the next 30 days, patient will achieve adherence to monitoring guidelines and medication adherence  to achieve therapeutic efficacy through collaboration with PharmD and provider.    Interventions: Inter-disciplinary care team collaboration (see longitudinal plan of care) Comprehensive medication review performed; medication list updated in electronic medical record  Verbal consent obtained for  [facility] enhanced pharmacy services (medication synchronization, adherence packaging, delivery coordination). A medication sync plan was created to allow patient to get all medications delivered once every 30 to 90 days per patient preference. Patient understands they have freedom to choose pharmacy and clinical pharmacist will coordinate care between all prescribers and [facility].   @[email or web address]@ @[email or web address]@ @[email or web address]@  Patient Goals/Self-Care Activities Over the next 30 days, patient will:  - take medications as prescribed check glucose Daily, document, and provide at future appointments check blood pressure Daily, document, and provide at future appointments  Follow Up Plan: The care management team will reach out to the patient again over the next 30 days.       Medication Assistance:  Patient unable to organize medications and forgets often. Also has trouble picking medications up from Pharmacy    Verbal consent obtained for [facility] enhanced pharmacy services (medication synchronization, adherence packaging, delivery coordination). A medication sync plan was created to allow patient to get all medications delivered once every 30 to 90 days per patient preference. Patient understands they have freedom to choose pharmacy and clinical pharmacist will coordinate care between all prescribers and [facility].    Follow up: Agree  Plan: The care management team will reach out to the patient again over the next 30 days.   Nathan Kennedy, Pharm.D., Managed Medicaid Pharmacist - [phone]    

## 2021-01-12 ENCOUNTER — Other Ambulatory Visit: Payer: Self-pay | Admitting: Family Medicine

## 2021-01-12 DIAGNOSIS — Z794 Long term (current) use of insulin: Secondary | ICD-10-CM

## 2021-01-12 DIAGNOSIS — E1149 Type 2 diabetes mellitus with other diabetic neurological complication: Secondary | ICD-10-CM

## 2021-01-12 NOTE — Telephone Encounter (Signed)
  Notes to clinic: review for future refills Patient has appt on 01/20/2021 Just filled on 12/23/2020   Requested Prescriptions  Pending Prescriptions Disp Refills   [place] 29 MG/3ML SOPN [Pharmacy Med Name: Victoza 3-Pak 0.6 mg/0.1 mL (18 mg/3 mL) subcutaneous pen injector] 9 mL 4    Sig: INJECT 1.8 MG into SKIN DAILY      Endocrinology:  Diabetes - GLP-1 Receptor Agonists Failed - 01/12/2021  8:03 AM      Failed - Valid encounter within last 6 months    Recent Outpatient Visits           7 months ago Type 2 diabetes mellitus with stage 3b chronic kidney disease, with long-term current use of insulin ([place])   [place], [place], MD   11 months ago Type 2 diabetes mellitus with stage 3b chronic kidney disease, with long-term current use of insulin ([place])   [facility] [place], Charlane Ferretti, MD   1 year ago No-show for appointment   [place], [place] J, NP   1 year ago 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, with long-term current use of insulin, unspecified retinopathy severity ([place])   [place], Charlane Ferretti, MD   1 year ago Type 2 diabetes mellitus with other neurologic complication, with long-term current use of insulin ([place])   [place], Charlane Ferretti, MD       Future Appointments             In 1 week [name], MD [place] - HBA1C is between 0 and 7.9 and within 180 days    HbA1c, POC (controlled diabetic range)  Date Value Ref Range Status  06/03/2020 6.3 0.0 - 7.0 % Final   Hgb A1c MFr Bld  Date Value Ref Range Status  12/05/2020 7.6 (H) 4.8 - 5.6 % Final    Comment:    (NOTE) Pre diabetes:          5.7%-6.4%  Diabetes:              >6.4%  Glycemic control for   <7.0% adults with diabetes

## 2021-01-15 ENCOUNTER — Other Ambulatory Visit: Payer: Self-pay | Admitting: Obstetrics and Gynecology

## 2021-01-15 NOTE — Patient Outreach (Signed)
Care Coordination  01/15/2021  Lesta Limbert 1961-05-28 [id number]   Medicaid Managed Care   Unsuccessful Outreach Note  01/15/2021 Name: Raihana Balderrama MRN: [id number] DOB: January 15, 1961  Referred by: [name], MD Reason for referral : High Risk Managed Medicaid (Unsuccessful telephone outreach)   An unsuccessful telephone outreach was attempted today. The patient was referred to the case management team for assistance with care management and care coordination.   Follow Up Plan: A member of the Managed Medicaid care management team will reach out to the patient again over the next 7-14 days.   [name] RN, BSN [place]  [facility] - Managed Medicaid High Risk [phone].

## 2021-01-15 NOTE — Patient Instructions (Signed)
Hi Ms. Coble, sorry I missed you today, I hope you are doing well - as a part of your Medicaid benefit, you are eligible for care management and care coordination services at no cost or copay. I was unable to reach you by phone today but would be happy to help you with your health related needs. Please feel free to call me at [phone].  A member of the Managed Medicaid care management team will reach out to you again over the next 7-14 days.   [name] RN, BSN [place]  [facility] - Managed Medicaid High Risk [phone].

## 2021-01-20 ENCOUNTER — Inpatient Hospital Stay: Payer: Medicaid Other | Admitting: Family Medicine

## 2021-02-08 ENCOUNTER — Other Ambulatory Visit: Payer: Self-pay | Admitting: Family Medicine

## 2021-02-08 DIAGNOSIS — E11311 Type 2 diabetes mellitus with unspecified diabetic retinopathy with macular edema: Secondary | ICD-10-CM

## 2021-02-08 DIAGNOSIS — E1149 Type 2 diabetes mellitus with other diabetic neurological complication: Secondary | ICD-10-CM

## 2021-02-08 DIAGNOSIS — Z794 Long term (current) use of insulin: Secondary | ICD-10-CM

## 2021-02-08 NOTE — Telephone Encounter (Signed)
Requested Prescriptions  Pending Prescriptions Disp Refills  . LANTUS SOLOSTAR 100 UNIT/ML Solostar Pen [Pharmacy Med Name: Lantus Solostar U-100 Insulin 100 unit/mL (3 mL) subcutaneous pen] 2 mL 0    Sig: INJECT 60 UNITS SUBCUTANEOUSLY EVERYDAY AT BEDTIME     Endocrinology:  Diabetes - Insulins Failed - 02/08/2021  7:59 AM      Failed - Valid encounter within last 6 months    Recent Outpatient Visits          8 months ago Type 2 diabetes mellitus with stage 3b chronic kidney disease, with long-term current use of insulin ([place])   [facility] [place], [place], MD   1 year ago Type 2 diabetes mellitus with stage 3b chronic kidney disease, with long-term current use of insulin ([place])   [facility] [place], Charlane Ferretti, MD   1 year ago No-show for appointment   [place], Amy J, NP   1 year ago 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, with long-term current use of insulin, unspecified retinopathy severity ([place])   [place], [place], MD   1 year ago Type 2 diabetes mellitus with other neurologic complication, with long-term current use of insulin ([place])   [place], Charlane Ferretti, MD      Future Appointments            In 1 week [name], MD [place] - HBA1C is between 0 and 7.9 and within 180 days    HbA1c, POC (controlled diabetic range)  Date Value Ref Range Status  06/03/2020 6.3 0.0 - 7.0 % Final   Hgb A1c MFr Bld  Date Value Ref Range Status  12/05/2020 7.6 (H) 4.8 - 5.6 % Final    Comment:    (NOTE) Pre diabetes:          5.7%-6.4%  Diabetes:              >6.4%  Glycemic control for   <7.0% adults with diabetes

## 2021-02-11 ENCOUNTER — Other Ambulatory Visit: Payer: Self-pay | Admitting: Family Medicine

## 2021-02-11 DIAGNOSIS — Z794 Long term (current) use of insulin: Secondary | ICD-10-CM

## 2021-02-11 DIAGNOSIS — E1149 Type 2 diabetes mellitus with other diabetic neurological complication: Secondary | ICD-10-CM

## 2021-02-11 DIAGNOSIS — I110 Hypertensive heart disease with heart failure: Secondary | ICD-10-CM

## 2021-02-11 IMAGING — MG MM DIGITAL SCREENING BILAT W/ TOMO W/ CAD
6 of 12 series · 6 of 36 positions shown · non-contrast
Comparison: Previous exam(s).

CLINICAL DATA: Screening.

EXAM:
DIGITAL SCREENING BILATERAL MAMMOGRAM WITH TOMO AND CAD

[L CC synth-2D]
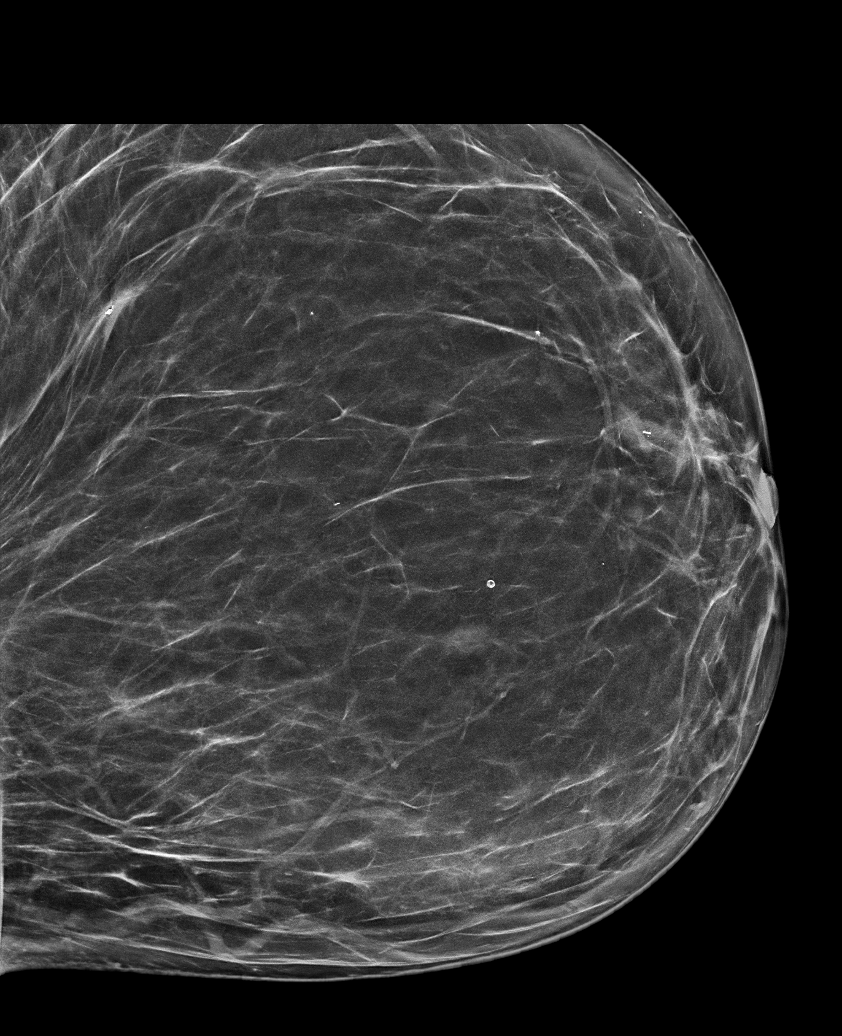

[L MLO synth-2D (1 of 2)]
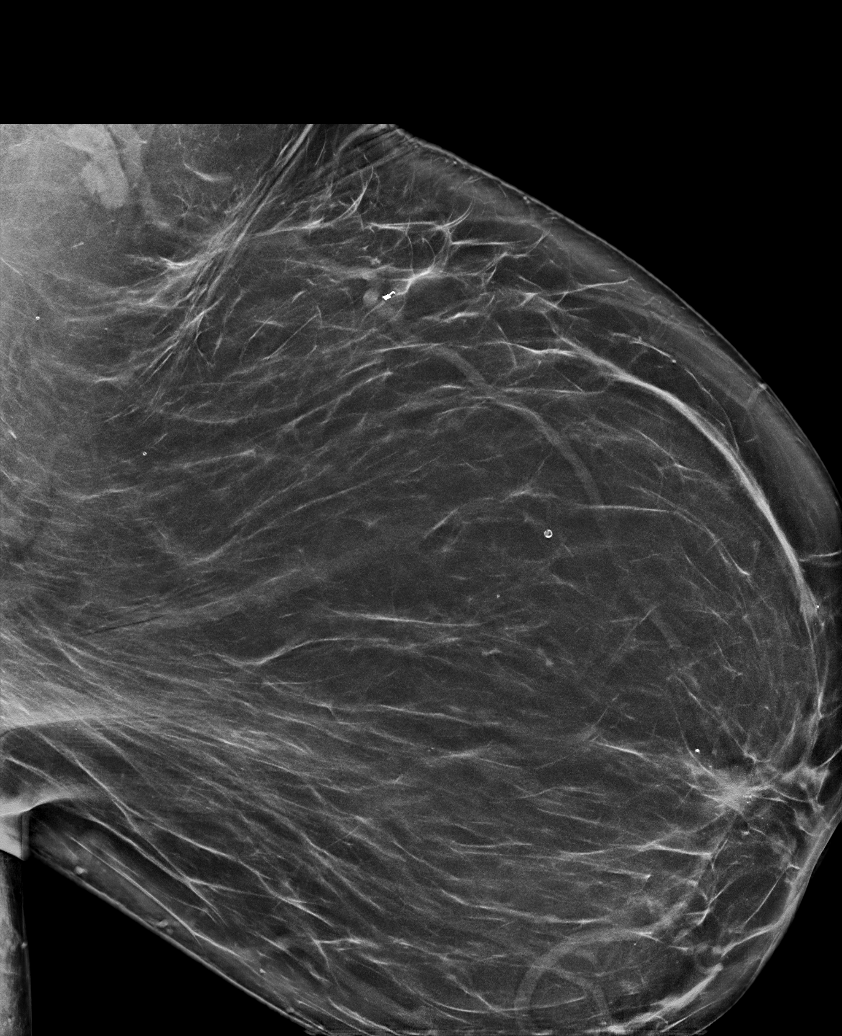

[R MLO synth-2D (1 of 2)]
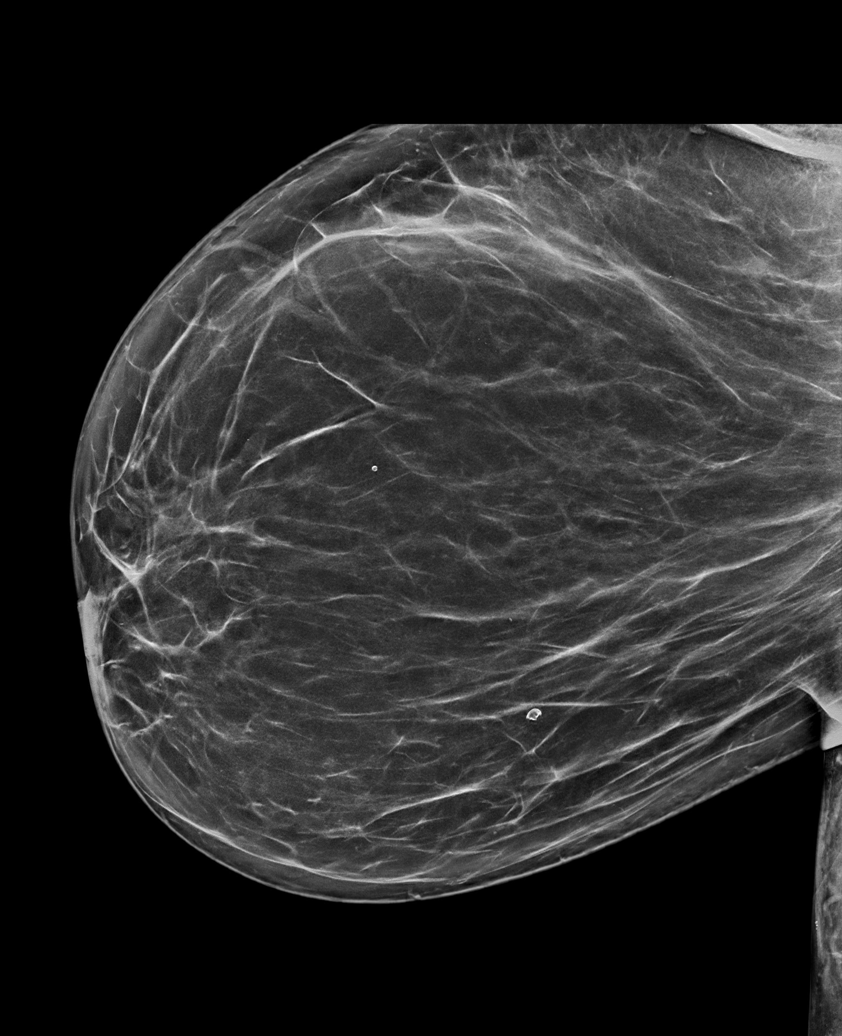

[R MLO synth-2D (2 of 2)]
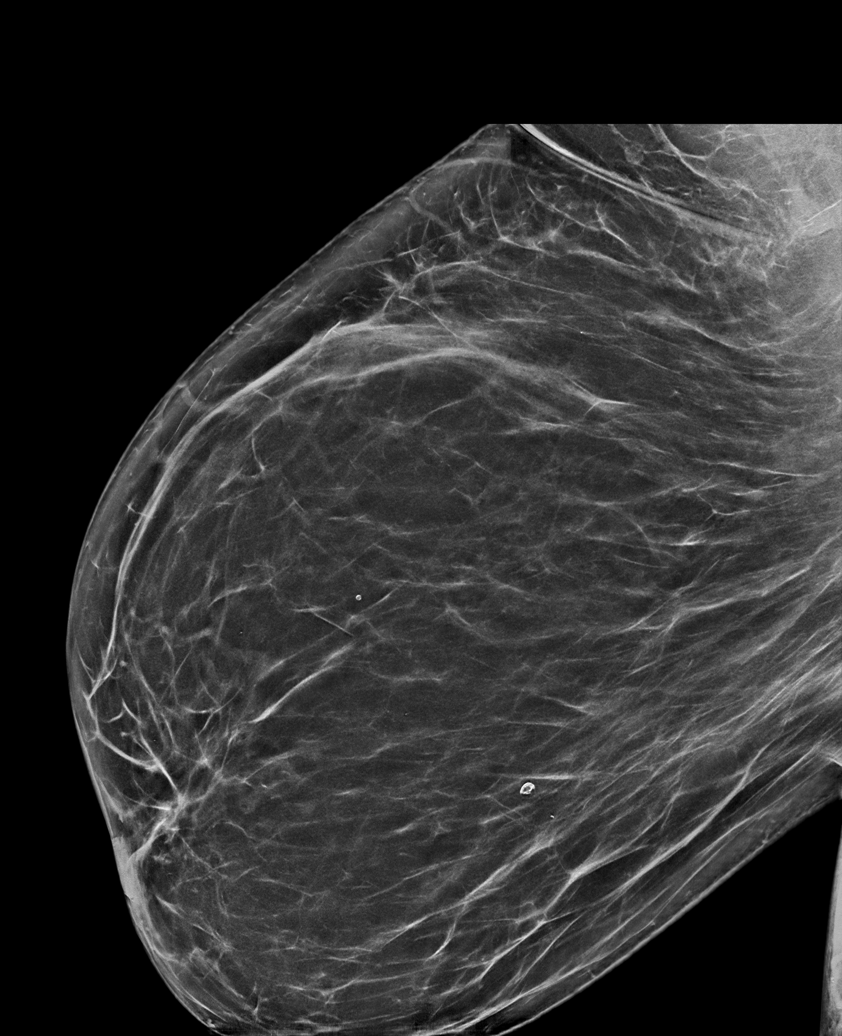

[L MLO synth-2D (2 of 2)]
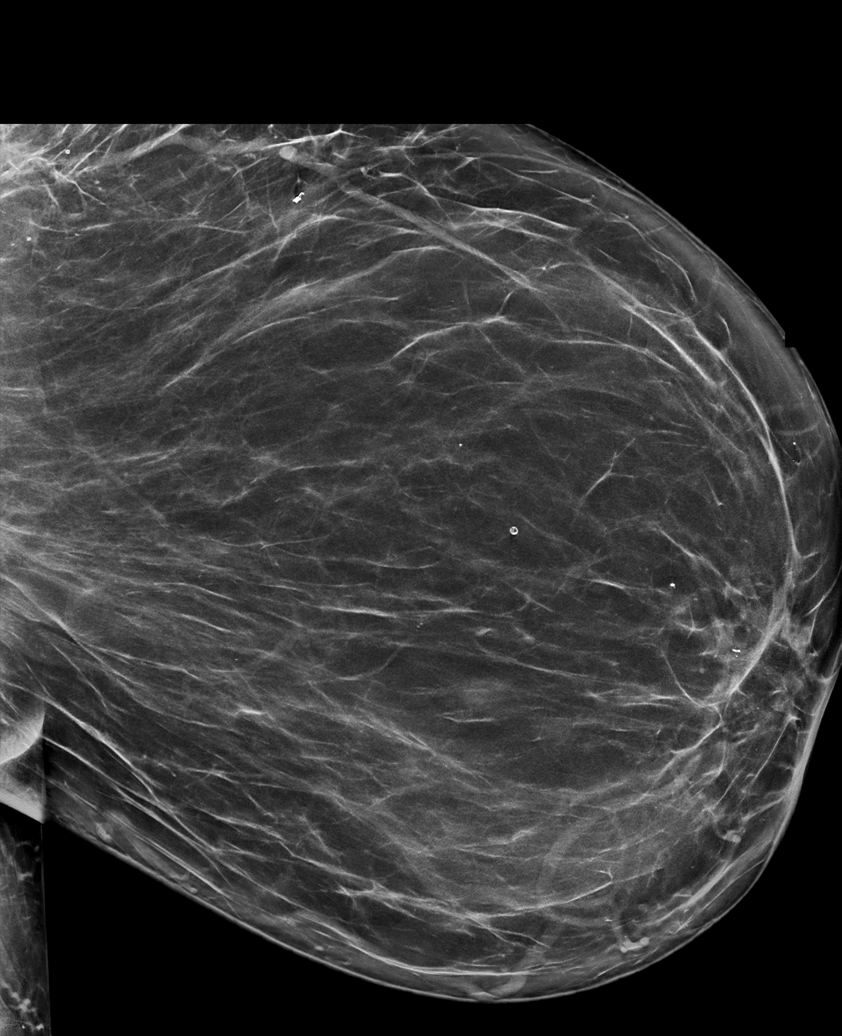

[R CC synth-2D]
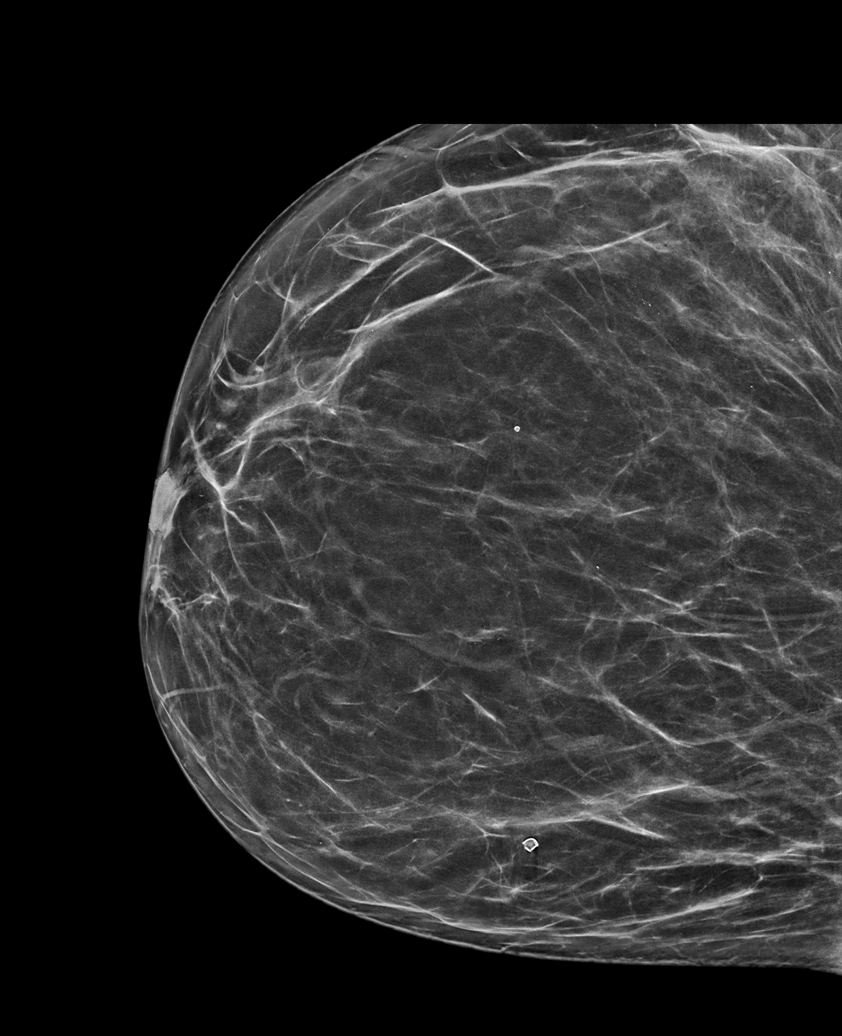

[6 of 36 positions shown; findings below may reference images not displayed]

ACR Breast Density Category b: There are scattered areas of
fibroglandular density.
FINDINGS: There are no findings suspicious for malignancy. Images were
processed with CAD.
IMPRESSION: No mammographic evidence of malignancy. A result letter of this
screening mammogram will be mailed directly to the patient.

RECOMMENDATION:
Screening mammogram in one year. (Code:[id number])

BI-RADS CATEGORY  1: Negative.

## 2021-02-11 NOTE — Telephone Encounter (Signed)
Requested Prescriptions  Pending Prescriptions Disp Refills  . labetalol (NORMODYNE) 300 MG tablet [Pharmacy Med Name: labetalol 300 mg tablet] 60 tablet 0    Sig: TAKE ONE TABLET BY MOUTH EVERY MORNING and TAKE ONE TABLET BY MOUTH EVERY EVENING     Cardiovascular:  Beta Blockers Failed - 02/11/2021  4:20 PM      Failed - Last BP in normal range    BP Readings from Last 1 Encounters:  12/25/20 (!) 162/87         Failed - Valid encounter within last 6 months    Recent Outpatient Visits          8 months ago Type 2 diabetes mellitus with stage 3b chronic kidney disease, with long-term current use of insulin ([place])   [place], [place]undas, MD   1 year ago Type 2 diabetes mellitus with stage 3b chronic kidney disease, with long-term current use of insulin ([place])   [facility] [place], Charlane Ferretti, MD   1 year ago No-show for appointment   [place], [place] J, NP   1 year ago 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 [place] 81 MG/3ML SOPN [Pharmacy Med Name: Victoza 3-Pak 0.6 mg/0.1 mL (18 mg/3 mL) subcutaneous pen injector] 9 mL 0    Sig: INJECT 1.8mg  into THE SKIN DAILY AS DIRECTED     Endocrinology:  Diabetes - GLP-1 Receptor Agonists Failed - 02/11/2021  4:20 PM      Failed - Valid encounter within  last 6 months    Recent Outpatient Visits          8 months ago Type 2 diabetes mellitus with stage 3b chronic kidney disease, with long-term current use of insulin ([place])   [facility] [place], [place] Luisa, MD   1 year ago Type 2 diabetes mellitus with stage 3b chronic kidney disease, with long-term current use of insulin ([place])   [facility] [place], Charlane Ferretti, MD   1 year ago No-show for appointment   [place], [place] J, NP   1 year ago 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, with long-term current use of insulin, unspecified retinopathy severity ([place])   [place], Charlane Ferretti, MD   1 year ago Type 2 diabetes mellitus with other neurologic complication, with long-term current use of insulin ([place])   [place], Enobong, MD      Future Appointments            In 1 week [name], MD [place]  Passed - HBA1C is between 0 and 7.9 and within 180 days    HbA1c, POC (controlled diabetic range)  Date Value Ref Range Status  06/03/2020 6.3 0.0 - 7.0 % Final   Hgb A1c MFr Bld  Date Value Ref Range Status  12/05/2020 7.6 (H) 4.8 - 5.6 % Final    Comment:    (NOTE) Pre diabetes:          5.7%-6.4%  Diabetes:              >6.4%  Glycemic control for   <7.0% adults with diabetes

## 2021-02-12 ENCOUNTER — Emergency Department (HOSPITAL_COMMUNITY)
Admission: EM | Admit: 2021-02-12 | Discharge: 2021-02-12 | Disposition: A | Discharge status: Home / Self Care | Payer: Medicaid Other | Attending: Emergency Medicine | Admitting: Emergency Medicine

## 2021-02-12 ENCOUNTER — Emergency Department (HOSPITAL_COMMUNITY): Payer: Medicaid Other

## 2021-02-12 ENCOUNTER — Telehealth: Payer: Self-pay | Admitting: Family Medicine

## 2021-02-12 ENCOUNTER — Encounter (HOSPITAL_COMMUNITY): Payer: Self-pay | Admitting: *Deleted

## 2021-02-12 ENCOUNTER — Other Ambulatory Visit: Payer: Medicaid Other

## 2021-02-12 ENCOUNTER — Other Ambulatory Visit: Payer: Self-pay

## 2021-02-12 DIAGNOSIS — R600 Localized edema: Secondary | ICD-10-CM | POA: Insufficient documentation

## 2021-02-12 DIAGNOSIS — Z79899 Other long term (current) drug therapy: Secondary | ICD-10-CM | POA: Insufficient documentation

## 2021-02-12 DIAGNOSIS — I130 Hypertensive heart and chronic kidney disease with heart failure and stage 1 through stage 4 chronic kidney disease, or unspecified chronic kidney disease: Secondary | ICD-10-CM | POA: Insufficient documentation

## 2021-02-12 DIAGNOSIS — I5033 Acute on chronic diastolic (congestive) heart failure: Secondary | ICD-10-CM | POA: Insufficient documentation

## 2021-02-12 DIAGNOSIS — N1832 Chronic kidney disease, stage 3b: Secondary | ICD-10-CM | POA: Insufficient documentation

## 2021-02-12 DIAGNOSIS — I509 Heart failure, unspecified: Secondary | ICD-10-CM

## 2021-02-12 DIAGNOSIS — Z8616 Personal history of COVID-19: Secondary | ICD-10-CM | POA: Diagnosis not present

## 2021-02-12 DIAGNOSIS — E1122 Type 2 diabetes mellitus with diabetic chronic kidney disease: Secondary | ICD-10-CM | POA: Insufficient documentation

## 2021-02-12 DIAGNOSIS — E11311 Type 2 diabetes mellitus with unspecified diabetic retinopathy with macular edema: Secondary | ICD-10-CM

## 2021-02-12 DIAGNOSIS — Z794 Long term (current) use of insulin: Secondary | ICD-10-CM | POA: Insufficient documentation

## 2021-02-12 DIAGNOSIS — E1149 Type 2 diabetes mellitus with other diabetic neurological complication: Secondary | ICD-10-CM

## 2021-02-12 DIAGNOSIS — I110 Hypertensive heart disease with heart failure: Secondary | ICD-10-CM | POA: Diagnosis not present

## 2021-02-12 DIAGNOSIS — E1140 Type 2 diabetes mellitus with diabetic neuropathy, unspecified: Secondary | ICD-10-CM | POA: Diagnosis not present

## 2021-02-12 DIAGNOSIS — R0602 Shortness of breath: Secondary | ICD-10-CM | POA: Diagnosis not present

## 2021-02-12 DIAGNOSIS — J441 Chronic obstructive pulmonary disease with (acute) exacerbation: Secondary | ICD-10-CM | POA: Diagnosis not present

## 2021-02-12 LAB — BASIC METABOLIC PANEL
Anion gap: 8 (ref 5–15)
BUN: 31 mg/dL — ABNORMAL HIGH (ref 6–20)
CO2: 21 mmol/L — ABNORMAL LOW (ref 22–32)
Calcium: 8.6 mg/dL — ABNORMAL LOW (ref 8.9–10.3)
Chloride: 112 mmol/L — ABNORMAL HIGH (ref 98–111)
Creatinine, Ser: 1.8 mg/dL — ABNORMAL HIGH (ref 0.44–1.00)
GFR, Estimated: 32 mL/min — ABNORMAL LOW (ref >60)
Glucose, Bld: 134 mg/dL — ABNORMAL HIGH (ref 70–99)
Potassium: 3.7 mmol/L (ref 3.5–5.1)
Sodium: 141 mmol/L (ref 135–145)

## 2021-02-12 LAB — CBC WITH DIFFERENTIAL/PLATELET
Abs Immature Granulocytes: 0 10*3/uL (ref 0.00–0.07)
Basophils Absolute: 0 10*3/uL (ref 0.0–0.1)
Basophils Relative: 0 %
Eosinophils Absolute: 0.2 10*3/uL (ref 0.0–0.5)
Eosinophils Relative: 4 %
HCT: 30.6 % — ABNORMAL LOW (ref 36.0–46.0)
Hemoglobin: 9.4 g/dL — ABNORMAL LOW (ref 12.0–15.0)
Immature Granulocytes: 0 %
Lymphocytes Relative: 37 %
Lymphs Abs: 1.3 10*3/uL (ref 0.7–4.0)
MCH: 27.6 pg (ref 26.0–34.0)
MCHC: 30.7 g/dL (ref 30.0–36.0)
MCV: 90 fL (ref 80.0–100.0)
Monocytes Absolute: 0.3 10*3/uL (ref 0.1–1.0)
Monocytes Relative: 8 %
Neutro Abs: 1.7 10*3/uL (ref 1.7–7.7)
Neutrophils Relative %: 51 %
Platelets: 164 10*3/uL (ref 150–400)
RBC: 3.4 MIL/uL — ABNORMAL LOW (ref 3.87–5.11)
RDW: 13.8 % (ref 11.5–15.5)
WBC: 3.5 10*3/uL — ABNORMAL LOW (ref 4.0–10.5)
nRBC: 0 % (ref 0.0–0.2)

## 2021-02-12 LAB — BRAIN NATRIURETIC PEPTIDE: B Natriuretic Peptide: 35.1 pg/mL (ref 0.0–100.0)

## 2021-02-12 LAB — TROPONIN I (HIGH SENSITIVITY)
Troponin I (High Sensitivity): 4 ng/L (ref <18)
Troponin I (High Sensitivity): 6 ng/L (ref <18)

## 2021-02-12 MED ORDER — IPRATROPIUM-ALBUTEROL 0.5-2.5 (3) MG/3ML IN SOLN
3.0000 mL | Freq: Once | RESPIRATORY_TRACT | Status: AC
  Administered 2021-02-12: 3 mL via RESPIRATORY_TRACT
  Filled 2021-02-12: qty 3

## 2021-02-12 MED ORDER — ALBUTEROL SULFATE (2.5 MG/3ML) 0.083% IN NEBU: 2.5000 mg | INHALATION_SOLUTION | RESPIRATORY_TRACT | 0 refills | Status: DC | PRN

## 2021-02-12 MED ORDER — FUROSEMIDE 40 MG PO TABS: 40.0000 mg | ORAL_TABLET | Freq: Every day | ORAL | 0 refills | Status: DC

## 2021-02-12 MED ORDER — PREDNISONE 50 MG PO TABS: 50.0000 mg | ORAL_TABLET | Freq: Every day | ORAL | 0 refills | Status: AC

## 2021-02-12 MED ORDER — METHYLPREDNISOLONE SODIUM SUCC 125 MG IJ SOLR
125.0000 mg | Freq: Once | INTRAMUSCULAR | Status: AC
  Administered 2021-02-12: 125 mg via INTRAVENOUS
  Filled 2021-02-12: qty 2

## 2021-02-12 MED ORDER — FUROSEMIDE 10 MG/ML IJ SOLN
60.0000 mg | Freq: Once | INTRAMUSCULAR | Status: AC
  Administered 2021-02-12: 60 mg via INTRAVENOUS
  Filled 2021-02-12: qty 6

## 2021-02-12 NOTE — ED Provider Notes (Signed)
[facility] EMERGENCY DEPARTMENT Provider Note   CSN: [id number] Arrival date & time: 02/12/21  1130     History Chief Complaint  Patient presents with   Shortness of Breath   Edema    [name] [name] is a 60 y.o. female past medical history significant for CHF, anemia, hepatitis C, CHF who comes in for evaluation of shortness of breath.  States she was previously on a diuretic however she does not think she is normal currently.  She has noted some swelling in her lower extremities.  Has been using albuterol inhaler at home without relief.  She sleeps with 2 pillows.  She does admit she gets short of breath at night however denies any orthopnea.  No recent COVID exposures.  No cough.  No headache, lightheadedness, dizziness, chest pain, hemoptysis, Donnell pain, back pain, lower extremity weakness.  Does report she has been generally fatigued.  Denies additional aggravating or alleviating factors.  States she typically feels like this when she has COPD exacerbations.  History obtained from patient and past medical records.  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   Qualifier: Diagnosis of  By: Jorene Minors, Scott     Hypertension    MRSA (methicillin resistant Staphylococcus aureus)    Trichomonas    VAGINITIS, BACTERIAL, RECURRENT 08/15/2007   Qualifier: Diagnosis of  By: Radene Ou MD, [place]      Patient Active Problem List   Diagnosis Date Noted   COPD exacerbation ([place]) 12/05/2020   COPD with acute exacerbation ([place]) 12/04/2020   CKD stage 3 due to type 2 diabetes mellitus ([place]) 12/04/2020   Anemia of chronic disease 12/04/2020   Class 3 obesity 07/27/2020   Acute respiratory disease due to COVID-19 virus 07/25/2020   Acute hypoxemic respiratory failure due to  COVID-19 ([place]) 07/25/2020   Morbid obesity ([place]) 11/23/2019   Acute respiratory failure with hypoxia ([place]) 11/06/2019   Chronic kidney disease, stage 3b ([place]) 11/06/2019   COPD with acute bronchitis ([place])    Acute kidney failure ([place]) 08/18/2019   Left ventricular hypertrophy 07/21/2019   Educated about COVID-19 virus infection 07/21/2019   Acute on chronic diastolic heart failure ([place])    Hypoxia    Dyspnea on exertion 05/09/2019   Sleep apnea 05/09/2019   Hyperglycemia 04/09/2019   Snoring 03/13/2019   Symptomatic anemia 08/17/2018   Acute renal failure (ARF) ([place]) 08/17/2018   Chronic cystitis 08/03/2018   Diabetic neuropathy ([place]) 06/16/2017   Non compliance w medication regimen 04/12/2017   Family history of colon cancer in mother 11/17/2016   Dyspareunia in female 11/11/2016   Screen for colon cancer 11/11/2016   History of ovarian cyst 11/11/2016   Chronic hepatitis C without hepatic coma ([place]) 11/09/2016   Hyperlipidemia 10/07/2016   Insulin dependent type 2 diabetes mellitus ([place]) 01/29/2014   Status post intraocular lens implant 11/08/2013   PCO (posterior capsular opacification) 09/28/2013   Pseudophakia 09/12/2013   GERD (gastroesophageal reflux disease) 09/06/2013   Hypertension 09/06/2013   BREAST PAIN, BILATERAL 02/19/2010   ANEMIA, IRON DEFICIENCY 10/03/2009   LEUKOPENIA, MILD 09/19/2009   Chest pain 09/19/2009   LIPOMA 06/18/2009   EUSTACHIAN TUBE DYSFUNCTION 06/18/2009   TINEA PEDIS 05/07/2009   SKIN LESION 05/07/2009  COUGH 05/07/2009   SUBSTANCE ABUSE, MULTIPLE 02/22/2007   HCVD (hypertensive cardiovascular disease) 02/22/2007    Past Surgical History:  Procedure Laterality Date   BREAST BIOPSY Left 2018   CESAREAN SECTION     breech     OB History     Gravida  1   Para  1   Term  1   Preterm      AB      Living  1      SAB      IAB      Ectopic      Multiple      Live Births  1           Family History  Problem  Relation Age of Onset   Colon cancer Mother    Liver disease Sister    Other Neg Hx    Breast cancer Neg Hx    Esophageal cancer Neg Hx    Rectal cancer Neg Hx     Social History   Tobacco Use   Smoking status: Never   Smokeless tobacco: Never  Vaping Use   Vaping Use: Never used  Substance Use Topics   Alcohol use: No   Drug use: No    Types: Cocaine, Marijuana    Comment: remote h/o cocaine and marijuana use    Home Medications Prior to Admission medications   Medication Sig Start Date End Date Taking? Authorizing Provider  albuterol (VENTOLIN HFA) 108 (90 Base) MCG/ACT inhaler Inhale 1 puff into the lungs every 4 (four) hours as needed for wheezing or shortness of breath. 12/10/20  Yes Newlin, Enobong, MD  amLODipine (NORVASC) 10 MG tablet Take 1 tablet (10 mg total) by mouth daily. 09/03/20  Yes Darylene Price A, FNP  atorvastatin (LIPITOR) 40 MG tablet Take 1 tablet (40 mg total) by mouth every morning. Must have HF appt for further refills 12/09/20  Yes Darylene Price A, FNP  furosemide (LASIX) 40 MG tablet Take 40 mg by mouth 2 (two) times daily.   Yes [provider]  furosemide (LASIX) 40 MG tablet Take 1 tablet (40 mg total) by mouth daily for 5 days. 02/12/21 02/17/21 Yes Donna Silverman A, PA-C  hydrALAZINE (APRESOLINE) 25 MG tablet Take 25 mg by mouth 3 (three) times daily. 06/26/20  Yes [provider]  labetalol (NORMODYNE) 300 MG tablet TAKE ONE TABLET BY MOUTH EVERY MORNING and TAKE ONE TABLET BY MOUTH EVERY EVENING 02/11/21  Yes Newlin, Enobong, MD  LANTUS SOLOSTAR 100 UNIT/ML Solostar Pen INJECT 60 UNITS SUBCUTANEOUSLY EVERYDAY AT BEDTIME 02/08/21  Yes Newlin, Enobong, MD  predniSONE (DELTASONE) 50 MG tablet Take 1 tablet (50 mg total) by mouth daily for 4 days. 02/12/21 02/16/21 Yes Makaylen Thieme A, PA-C  pregabalin (LYRICA) 75 MG capsule TAKE ONE CAPSULE BY MOUTH EVERY MORNING and TAKE ONE CAPSULE BY MOUTH EVERY EVENING 12/10/20  Yes Newlin, Enobong, MD   VICTOZA 18 MG/3ML SOPN INJECT 1.14m into THE SKIN DAILY AS DIRECTED 02/11/21  Yes Newlin, Enobong, MD  Vitamin D, Ergocalciferol, (DRISDOL) 1.25 MG (50000 UNIT) CAPS capsule Take 1 capsule by mouth once a week. 09/09/20  Yes [provider]  albuterol (PROVENTIL) (2.5 MG/3ML) 0.083% nebulizer solution Take 3 mLs (2.5 mg total) by nebulization every 4 (four) hours as needed for wheezing or shortness of breath. 02/12/21   Elexia Friedt A, PA-C  blood glucose meter kit and supplies KIT Dispense based on patient and insurance preference. Use up to four times  daily as directed. (FOR ICD-9 250.00, 250.01). 04/12/19   Dustin Flock, MD  ferrous sulfate 325 (65 FE) MG tablet Take 325 mg by mouth daily with breakfast.    [provider]  furosemide (LASIX) 40 MG tablet Take 1 tablet (40 mg total) by mouth 2 (two) times daily. 12/05/19 12/04/20  [name], FNP    Allergies    Lisinopril  Review of Systems   Review of Systems  Constitutional:  Positive for fatigue. Negative for activity change, appetite change, chills, diaphoresis, fever and unexpected weight change.  HENT: Negative.    Respiratory:  Positive for shortness of breath. Negative for apnea, cough, choking, chest tightness, wheezing and stridor.   Cardiovascular:  Positive for leg swelling. Negative for chest pain and palpitations.  Genitourinary: Negative.   Musculoskeletal: Negative.   Skin: Negative.   Neurological: Negative.   All other systems reviewed and are negative.  Physical Exam Updated Vital Signs BP 123/74 (BP Location: Right Arm)   Pulse 80   Temp 97.7 F (36.5 C) (Oral)   Resp 20   Ht _0  (1.549 m)   Wt 113.4 kg   LMP 04/01/2010   SpO2 100%   BMI 47.24 kg/m   Physical Exam Vitals and nursing note reviewed.  Constitutional:      General: She is not in acute distress.    Appearance: She is well-developed. She is obese. She is not ill-appearing.  HENT:     Head: Normocephalic and  atraumatic.     Comments: No facial edema    Mouth/Throat:     Mouth: Mucous membranes are moist.  Eyes:     Pupils: Pupils are equal, round, and reactive to light.  Cardiovascular:     Rate and Rhythm: Normal rate.     Pulses: Normal pulses.          Radial pulses are 2+ on the right side and 2+ on the left side.       Dorsalis pedis pulses are 2+ on the right side and 2+ on the left side.     Heart sounds: Normal heart sounds.  Pulmonary:     Effort: No respiratory distress.     Breath sounds: Wheezing present.  Chest:     Comments: Equal rise and fall to chest wall Abdominal:     General: Bowel sounds are normal. There is no distension.     Palpations: Abdomen is soft.  Musculoskeletal:        General: Normal range of motion.     Cervical back: Normal range of motion.     Right lower leg: No tenderness. 1+ Pitting Edema present.     Left lower leg: No tenderness. 1+ Pitting Edema present.     Comments: 1+ pitting edema to BLE. No bony tenderness. Moves all 4 extremities without difficulty   Skin:    General: Skin is warm and dry.     Capillary Refill: Capillary refill takes less than 2 seconds.  Neurological:     General: No focal deficit present.     Mental Status: She is alert and oriented to person, place, and time.  Psychiatric:        Mood and Affect: Mood normal.    ED Results / Procedures / Treatments   Labs (all labs ordered are listed, but only abnormal results are displayed) Labs Reviewed  CBC WITH DIFFERENTIAL/PLATELET - Abnormal; Notable for the following components:      Result Value   WBC  3.5 (*)    RBC 3.40 (*)    Hemoglobin 9.4 (*)    HCT 30.6 (*)    All other components within normal limits  BASIC METABOLIC PANEL - Abnormal; Notable for the following components:   Chloride 112 (*)    CO2 21 (*)    Glucose, Bld 134 (*)    BUN 31 (*)    Creatinine, Ser 1.80 (*)    Calcium 8.6 (*)    GFR, Estimated 32 (*)    All other components within normal  limits  BRAIN NATRIURETIC PEPTIDE  TROPONIN I (HIGH SENSITIVITY)  TROPONIN I (HIGH SENSITIVITY)    EKG None  Radiology DG Chest 2 View  Result Date: 02/12/2021 CLINICAL DATA:  60 year old female with shortness of breath and swelling in her lower legs EXAM: CHEST - 2 VIEW COMPARISON:  Multiple chest radiographs, most recently 12/24/2020. CT chest, 12/04/2020. FINDINGS: Cardiac silhouette is at the upper limit of normal in size, unchanged. Relative hypoinflation of lungs. No focal consolidation. No pleural effusion or pneumothorax. Scattered degenerative changes of spine. No acute osseous abnormality. IMPRESSION: No acute cardiopulmonary disease Electronically Signed   By: Michaelle Birks MD   On: 02/12/2021 12:51    Procedures Procedures   Medications Ordered in ED Medications  methylPREDNISolone sodium succinate (SOLU-MEDROL) 125 mg/2 mL injection 125 mg (125 mg Intravenous Given 02/12/21 1744)  furosemide (LASIX) injection 60 mg (60 mg Intravenous Given 02/12/21 1749)  ipratropium-albuterol (DUONEB) 0.5-2.5 (3) MG/3ML nebulizer solution 3 mL (3 mLs Nebulization Given 02/12/21 1746)    ED Course  I have reviewed the triage vital signs and the nursing notes.  Pertinent labs & imaging results that were available during my care of the patient were reviewed by me and considered in my medical decision making (see chart for details).  Here for evaluation of shortness of breath.  Patient states this feels similar to her COPD exacerbation.  Unfortunately she has not been taking her Lasix at home.  She does have 1+ pitting edema to her lower extremities.  Her heart is clear.  She does have some mild expiratory wheeze.  She is afebrile, nonseptic, not ill-appearing.  She has no tachycardia, tachypnea or hypoxia.   Work-up started from triage today personally reviewed and interpreted:  CBC with leukopenia at 3.5, hgb stable BMP glucose 134, creatinine 1.80, similar to prior BNP 35 Trop 4>>6 Chest  x-ray without pulmonary edema, pneumothorax, infiltrate EKG without ischemic changes  Patient reassessed.  Wheeze resolved with steroids, pain overall.  Will DC home with symptomatic management.  I did stress compliance with her home medications, specifically her Lasix to help with her lower extremity edema.  I discussed following up with cardiology.  She does seem to overall have poor insight to her medications.  She would benefit from PCP follow-up as well.  Low suspicion for acute ACS, PE, dissection, bacterial infectious process, pneumothorax.  She is ambulatory here without any hypoxia.  DC home in stable condition.  The patient has been appropriately medically screened and/or stabilized in the ED. I have low suspicion for any other emergent medical condition which would require further screening, evaluation or treatment in the ED or require inpatient management.  Patient is hemodynamically stable and in no acute distress.  Patient able to ambulate in department prior to ED.  Evaluation does not show acute pathology that would require ongoing or additional emergent interventions while in the emergency department or further inpatient treatment.  I have discussed the diagnosis  with the patient and answered all questions.  Pain is been managed while in the emergency department and patient has no further complaints prior to discharge.  Patient is comfortable with plan discussed in room and is stable for discharge at this time.  I have discussed strict return precautions for returning to the emergency department.  Patient was encouraged to follow-up with PCP/specialist refer to at discharge.      MDM Rules/Calculators/A&P                           Nature Kueker was evaluated in Emergency Department on 02/12/2021 for the symptoms described in the history of present illness. She was evaluated in the context of the global COVID-19 pandemic, which necessitated consideration that the patient might be  at risk for infection with the SARS-CoV-2 virus that causes COVID-19. Institutional protocols and algorithms that pertain to the evaluation of patients at risk for COVID-19 are in a state of rapid change based on information released by regulatory bodies including the CDC and federal and state organizations. These policies and algorithms were followed during the patient's care in the ED.  Final Clinical Impression(s) / ED Diagnoses Final diagnoses:  COPD exacerbation ([place])  Acute on chronic congestive heart failure, unspecified heart failure type ([place])    Rx / DC Orders ED Discharge Orders          Ordered    albuterol (PROVENTIL) (2.5 MG/3ML) 0.083% nebulizer solution  Every 4 hours PRN        02/12/21 1820    predniSONE (DELTASONE) 50 MG tablet  Daily        02/12/21 1820    furosemide (LASIX) 40 MG tablet  Daily        02/12/21 1820             Telecia Larocque A, PA-C 02/12/21 1827    Daleen Bo, MD 02/13/21 1404

## 2021-02-12 NOTE — ED Triage Notes (Signed)
Pt reports waking up with swelling to her face and legs today. Having fatigue and sob. Yesterday began having sob with mild exertion. Reports hx of chf. No acute resp distress is noted at this time.

## 2021-02-12 NOTE — ED Provider Notes (Signed)
Emergency Medicine Provider Triage Evaluation Note  [name] [name] , a 60 y.o. female  was evaluated in triage.  Pt complains of shortness of breath.  Weeks.  States she is currently on a diuretic, she does not believe she is on this currently.  Has been using albuterol at home.  She states she has noted some swelling in her bilateral lower legs.  She feels short of breath with short distances.  No gross exertional chest pain.  2 pillows at night.  Does states she gets up in the middle the night because she feels short of breath.  No COVID exposure  Review of Systems  Positive: Shortness of breath, lower extremity swelling Negative: Headache, cough, abdominal pain, neck pain  Physical Exam  LMP 04/01/2010  Gen:   Awake, no distress   Resp:  Normal effort, coarse breath sounds bilaterally, decreased at bases, right greater than left MSK:   Moves extremities without difficulty  Other:    Medical Decision Making  Medically screening exam initiated at 11:58 AM.  Appropriate orders placed.  Eular Panek was informed that the remainder of the evaluation will be completed by another provider, this initial triage assessment does not replace that evaluation, and the importance of remaining in the ED until their evaluation is complete.  SOB  VS stable, no respiratory distress, work up started   [place], Silas Flood, PA-C 02/12/21 1159    Gareth Morgan, MD 02/12/21 2317

## 2021-02-12 NOTE — Telephone Encounter (Signed)
Allie calling from [facility] is calling regarding LANTUS SOLOSTAR 100 UNIT/ML Solostar Pen [[id number]] script is for 71ml this will only last her 5 days. Please advise [phone]- [phone]

## 2021-02-12 NOTE — Discharge Instructions (Signed)
Check your blood sugars frequently.  If greater than 250 stop taking the steroids  Follow-up with primary care provider.

## 2021-02-12 NOTE — Patient Outreach (Signed)
Meds due on 02/19/21, patient didn't answer but Fritz Pickerel affirmed he'd be there to receive meds  Amlodipine   10 mg  1    Labetalol   300 mg  1  1  Atorvastatin   40 mg  1    Furosemide   40 mg  1 1   Ferrous Sulfate   325 mg  1  1  Hydralazine   25 mg  1 1 1   Lyrica   75 mg  1  1  Victoza   18mg /22ml     Lantus Solostar

## 2021-02-13 ENCOUNTER — Telehealth: Payer: Self-pay

## 2021-02-13 NOTE — Telephone Encounter (Signed)
Called [name] [name]. Explained that patient has not been seen since 05/2020. Authorized a fill for 1 Lantus pen to be filled today. This will last the patient until her upcoming appointment on 02/18/2021.

## 2021-02-13 NOTE — Telephone Encounter (Signed)
Transition Care Management Unsuccessful Follow-up Telephone Call  Date of discharge and from where:  02/12/2021-[facility]  Attempts:  1st Attempt  Reason for unsuccessful TCM follow-up call:  Left voice message

## 2021-02-16 NOTE — Telephone Encounter (Signed)
Transition Care Management Follow-up Telephone Call Date of discharge and from where: 02/12/2021-[facility]  How have you been since you were released from the hospital? Patient stated she still has extremity swelling with no appetite. Any questions or concerns? No  Items Reviewed: Did the pt receive and understand the discharge instructions provided? Yes  Medications obtained and verified? Yes  Other? No  Any new allergies since your discharge? No  Dietary orders reviewed? N/A Do you have support at home? Yes   Home Care and Equipment/Supplies: Were home health services ordered? not applicable If so, what is the name of the agency? N/A  Has the agency set up a time to come to the patient's home? not applicable Were any new equipment or medical supplies ordered?  No What is the name of the medical supply agency? N/A Were you able to get the supplies/equipment? not applicable Do you have any questions related to the use of the equipment or supplies? No  Functional Questionnaire: (I = Independent and D = Dependent) ADLs: I  Bathing/Dressing- I  Meal Prep- I  Eating- I  Maintaining continence- I  Transferring/Ambulation- I  Managing Meds- I  Follow up appointments reviewed:  PCP Hospital f/u appt confirmed? Yes  Scheduled to see Dr. Margarita Rana on 02/18/2021 @ 2:30 pm. [facility] f/u appt confirmed? No   Are transportation arrangements needed? No  If their condition worsens, is the pt aware to call PCP or go to the Emergency Dept.? Yes Was the patient provided with contact information for the PCP's office or ED? Yes Was to pt encouraged to call back with questions or concerns? Yes

## 2021-02-17 ENCOUNTER — Ambulatory Visit: Payer: Self-pay

## 2021-02-18 ENCOUNTER — Other Ambulatory Visit: Payer: Self-pay

## 2021-02-18 ENCOUNTER — Ambulatory Visit: Payer: Medicaid Other | Attending: Family Medicine | Admitting: Family Medicine

## 2021-02-18 ENCOUNTER — Encounter: Payer: Self-pay | Admitting: Family Medicine

## 2021-02-18 VITALS — BP 154/80 | HR 80 | Ht 61.0 in | Wt 253.0 lb

## 2021-02-18 DIAGNOSIS — N1832 Chronic kidney disease, stage 3b: Secondary | ICD-10-CM

## 2021-02-18 DIAGNOSIS — E1122 Type 2 diabetes mellitus with diabetic chronic kidney disease: Secondary | ICD-10-CM | POA: Diagnosis not present

## 2021-02-18 DIAGNOSIS — E11311 Type 2 diabetes mellitus with unspecified diabetic retinopathy with macular edema: Secondary | ICD-10-CM | POA: Diagnosis not present

## 2021-02-18 DIAGNOSIS — I5032 Chronic diastolic (congestive) heart failure: Secondary | ICD-10-CM

## 2021-02-18 DIAGNOSIS — E1149 Type 2 diabetes mellitus with other diabetic neurological complication: Secondary | ICD-10-CM | POA: Diagnosis not present

## 2021-02-18 DIAGNOSIS — I110 Hypertensive heart disease with heart failure: Secondary | ICD-10-CM | POA: Diagnosis not present

## 2021-02-18 DIAGNOSIS — Z794 Long term (current) use of insulin: Secondary | ICD-10-CM | POA: Diagnosis not present

## 2021-02-18 DIAGNOSIS — R0609 Other forms of dyspnea: Secondary | ICD-10-CM | POA: Diagnosis not present

## 2021-02-18 LAB — POCT GLYCOSYLATED HEMOGLOBIN (HGB A1C): HbA1c, POC (controlled diabetic range): 6.6 % (ref 0.0–7.0)

## 2021-02-18 LAB — GLUCOSE, POCT (MANUAL RESULT ENTRY): POC Glucose: 198 mg/dl — AB (ref 70–99)

## 2021-02-18 MED ORDER — QVAR REDIHALER 80 MCG/ACT IN AERB
2.0000 | INHALATION_SPRAY | Freq: Two times a day (BID) | RESPIRATORY_TRACT | 1 refills | Status: DC
Start: 2021-02-18 — End: 2021-02-23

## 2021-02-18 NOTE — Progress Notes (Signed)
Has been having swelling in her face.

## 2021-02-18 NOTE — Progress Notes (Signed)
Subjective:  Patient ID: [name] [name], female    DOB: [birth year]  Age: 60 y.o. MRN: [id number]  CC: Hospitalization Follow-up   HPI [name] [name]  is a 60 year old female with a history of type 2 diabetes mellitus (A1c 6.6), diabetic neuropathy hypertension, diabetic retinopathy, hyperlipidemia, hepatitis C (completed treatment with harvoni), OSA, diastolic CHF (EF 55 to 47%), stage III CKD.    Interval History: States she had swelling of her face like when she had a reaction to Lisinopril. Lst episode was 4 days ago. She is unsure of any aditional allergies. Those episodes are not associated with dyspnea but she had periorbital edema. She has had difficulty breathing. She has dyspnea on exertion. Complains of reduced exercise tolerance. She gets dyspneic on exertion. Review of her chart reveals visits to the ED for COPD exacerbation and she was treated with Solu-Medrol.  Chest x-ray reveals no pleural effusion, no acute cardiopulmonary disease.  With regards to her diabetes mellitus she has no hypoglycemia, neuropathy is controlled on gabapentin.  She does have diabetic retinopathy which is managed by ophthalmology. Chronic kidney disease is managed by nephrology. She is needing rollator and a shower chair due to her ongoing dyspnea.  A cane will not suffice especially when she is out doing grocery shopping as she frequently has to rest.  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   Qualifier: Diagnosis of  By: Jorene Minors, [place]     Hypertension    MRSA (methicillin resistant Staphylococcus aureus)    Trichomonas    VAGINITIS, BACTERIAL, RECURRENT 08/15/2007   Qualifier: Diagnosis of  By: Radene Ou MD, [place]      Past Surgical History:  Procedure Laterality Date   BREAST BIOPSY Left 2018   CESAREAN SECTION     breech     Family History  Problem Relation Age of Onset   Colon cancer Mother    Liver disease Sister    Other Neg Hx    Breast cancer Neg Hx    Esophageal cancer Neg Hx    Rectal cancer Neg Hx     Allergies  Allergen Reactions   Lisinopril Swelling    Outpatient Medications Prior to Visit  Medication Sig Dispense Refill   albuterol (PROVENTIL) (2.5 MG/3ML) 0.083% nebulizer solution Take 3 mLs (2.5 mg total) by nebulization every 4 (four) hours as needed for wheezing or shortness of breath. 75 mL 0   albuterol (VENTOLIN HFA) 108 (90 Base) MCG/ACT inhaler Inhale 1 puff into the lungs every 4 (four) hours as needed for wheezing or shortness of breath. 18 g 0   amLODipine (NORVASC) 10 MG tablet Take 1 tablet (10 mg total) by mouth daily. 90 tablet 3   atorvastatin (LIPITOR) 40 MG tablet Take 1 tablet (40 mg total) by mouth every morning. Must have HF appt for further refills 90 tablet 0   blood glucose meter kit and supplies KIT Dispense based on patient and insurance preference. Use up to four times daily as directed. (FOR ICD-9 250.00, 250.01). 1 each 2   ferrous sulfate 325 (65 FE) MG tablet Take 325 mg by mouth daily with breakfast.     furosemide (LASIX) 40 MG tablet Take 40 mg by mouth 2 (two) times daily.     hydrALAZINE (APRESOLINE) 25 MG tablet Take 25  mg by mouth 3 (three) times daily.     labetalol (NORMODYNE) 300 MG tablet TAKE ONE TABLET BY MOUTH EVERY MORNING and TAKE ONE TABLET BY MOUTH EVERY EVENING 60 tablet 0   LANTUS SOLOSTAR 100 UNIT/ML Solostar Pen INJECT 60 UNITS SUBCUTANEOUSLY EVERYDAY AT BEDTIME 3 mL 0   pregabalin (LYRICA) 75 MG capsule TAKE ONE CAPSULE BY MOUTH EVERY MORNING and TAKE ONE CAPSULE BY MOUTH EVERY EVENING 60 capsule 4   VICTOZA 18 MG/3ML SOPN INJECT 1.28m into THE SKIN DAILY AS DIRECTED 9 mL 0   Vitamin D, Ergocalciferol, (DRISDOL) 1.25 MG (50000 UNIT) CAPS capsule Take 1 capsule by mouth once a week.     furosemide (LASIX) 40 MG tablet Take 1 tablet (40  mg total) by mouth 2 (two) times daily. 180 tablet 3   furosemide (LASIX) 40 MG tablet Take 1 tablet (40 mg total) by mouth daily for 5 days. 5 tablet 0   No facility-administered medications prior to visit.     ROS Review of Systems  Constitutional:  Negative for activity change, appetite change and fatigue.  HENT:  Negative for congestion, sinus pressure and sore throat.   Eyes:  Negative for visual disturbance.  Respiratory:  Positive for shortness of breath. Negative for cough, chest tightness and wheezing.   Cardiovascular:  Negative for chest pain and palpitations.  Gastrointestinal:  Negative for abdominal distention, abdominal pain and constipation.  Endocrine: Negative for polydipsia.  Genitourinary:  Negative for dysuria and frequency.  Musculoskeletal:  Negative for arthralgias and back pain.  Skin:  Negative for rash.  Neurological:  Negative for tremors, light-headedness and numbness.  Hematological:  Does not bruise/bleed easily.  Psychiatric/Behavioral:  Negative for agitation and behavioral problems.    Objective:  BP (!) 154/80   Pulse 80   Ht '5\' 1"'  (1.549 m)   Wt 253 lb (114.8 kg)   LMP 04/01/2010   SpO2 95%   BMI 47.80 kg/m   BP/Weight 02/18/2021 72/63/335465/12/2561 Systolic BP [id number] Diastolic BP 80 82 87  Wt. (Lbs) 253 250 -  BMI 47.8 47.24 41.17  Some encounter information is confidential and restricted. Go to Review Flowsheets activity to see all data.      Physical Exam Constitutional:      Appearance: She is well-developed.  Cardiovascular:     Rate and Rhythm: Normal rate.     Heart sounds: Normal heart sounds. No murmur heard. Pulmonary:     Effort: Pulmonary effort is normal.     Breath sounds: Normal breath sounds. No wheezing or rales.  Chest:     Chest wall: No tenderness.  Abdominal:     General: Bowel sounds are normal. There is no distension.     Palpations: Abdomen is soft. There is no mass.     Tenderness: There is no  abdominal tenderness.  Musculoskeletal:        General: Normal range of motion.     Right lower leg: Edema present.     Left lower leg: Edema present.  Neurological:     Mental Status: She is alert and oriented to person, place, and time.  Psychiatric:        Mood and Affect: Mood normal.    CMP Latest Ref Rng & Units 02/12/2021 12/24/2020 12/05/2020  Glucose 70 - 99 mg/dL 134(H) 226(H) 456(H)  BUN 6 - 20 mg/dL 31(H) 29(H) 37(H)  Creatinine 0.44 - 1.00 mg/dL 1.80(H) 1.70(H) 1.82(H)  Sodium 135 - 145  mmol/L 141 141 136  Potassium 3.5 - 5.1 mmol/L 3.7 3.4(L) 4.4  Chloride 98 - 111 mmol/L 112(H) 109 104  CO2 22 - 32 mmol/L 21(L) 23 23  Calcium 8.9 - 10.3 mg/dL 8.6(L) 8.5(L) 9.1  Total Protein 6.5 - 8.1 g/dL - - -  Total Bilirubin 0.3 - 1.2 mg/dL - - -  Alkaline Phos 38 - 126 U/L - - -  AST 15 - 41 U/L - - -  ALT 0 - 44 U/L - - -    Lipid Panel     Component Value Date/Time   CHOL 147 01/29/2019 1118   TRIG 102 01/29/2019 1118   HDL 59 01/29/2019 1118   CHOLHDL 2.5 01/29/2019 1118   CHOLHDL 3 06/18/2014 0951   VLDL 19.6 06/18/2014 0951   LDLCALC 68 01/29/2019 1118    CBC    Component Value Date/Time   WBC 3.5 (L) 02/12/2021 1204   RBC 3.40 (L) 02/12/2021 1204   HGB 9.4 (L) 02/12/2021 1204   HGB 9.8 (L) 07/10/2019 1544   HCT 30.6 (L) 02/12/2021 1204   HCT 30.9 (L) 07/10/2019 1544   PLT 164 02/12/2021 1204   PLT 171 07/10/2019 1544   MCV 90.0 02/12/2021 1204   MCV 86 07/10/2019 1544   MCH 27.6 02/12/2021 1204   MCHC 30.7 02/12/2021 1204   RDW 13.8 02/12/2021 1204   RDW 12.7 07/10/2019 1544   LYMPHSABS 1.3 02/12/2021 1204   LYMPHSABS 1.3 07/10/2019 1544   MONOABS 0.3 02/12/2021 1204   EOSABS 0.2 02/12/2021 1204   EOSABS 0.0 07/10/2019 1544   BASOSABS 0.0 02/12/2021 1204   BASOSABS 0.0 07/10/2019 1544    Lab Results  Component Value Date   HGBA1C 6.6 02/18/2021    Assessment & Plan:  1. Type 2 diabetes mellitus with other neurologic complication, with  long-term current use of insulin (HCC) Controlled with A1c of 6.6 Continue current regimen - POCT glucose (manual entry) - POCT glycosylated hemoglobin (Hb A1C)  2. Other form of dyspnea We do not have a formal diagnosis of COPD as she has never had a pulmonary function test In the meantime I have commenced Qvar and will proceed with the pulmonary function test Diastolic CHF could be contributing to symptoms Will complete home health order for Rollator and shower chair - Pulmonary function test; Future - beclomethasone (QVAR REDIHALER) 80 MCG/ACT inhaler; Inhale 2 puffs into the lungs 2 (two) times daily.  Dispense: 1 each; Refill: 1  3. 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 Hypertensive heart disease with chronic diastolic congestive heart failure (HCC) EF 66 5% from 10/2019 Due to renal function will hold off on initiating SGLT2  5. Type 2 diabetes mellitus with stage 3b chronic kidney disease, with long-term current use of insulin ([place]) Diabetic nephropathy Optimal glycemic control is imperative Continue to follow-up with nephrologist.   No orders of the defined types were placed in this encounter.  Return in about 1 month (around 03/21/2021) for complete physical exam.       [name], MD, FAAFP. [facility], [place]   02/18/2021, 3:08 PM

## 2021-02-18 NOTE — Patient Instructions (Signed)
Edema  Edema is when you have too much fluid in your body or under your skin. Edema may make your legs, feet, and ankles swell up. Swelling is also common in looser tissues, like around your eyes. This is a common condition. It gets more common as you get older. There are many possible causes of edema. Eating too much salt (sodium) and being on your feet or sitting for a long time can cause edema in yourlegs, feet, and ankles. Hot weather may make edema worse. Edema is usually painless. Your skin may look swollen or shiny. Follow these instructions at home: Keep the swollen body part raised (elevated) above the level of your heart when you are sitting or lying down. Do not sit still or stand for a long time. Do not wear tight clothes. Do not wear garters on your upper legs. Exercise your legs. This can help the swelling go down. Wear elastic bandages or support stockings as told by your doctor. Eat a low-salt (low-sodium) diet to reduce fluid as told by your doctor. Depending on the cause of your swelling, you may need to limit how much fluid you drink (fluid restriction). Take over-the-counter and prescription medicines only as told by your doctor. Contact a doctor if: Treatment is not working. You have heart, liver, or kidney disease and have symptoms of edema. You have sudden and unexplained weight gain. Get help right away if: You have shortness of breath or chest pain. You cannot breathe when you lie down. You have pain, redness, or warmth in the swollen areas. You have heart, liver, or kidney disease and get edema all of a sudden. You have a fever and your symptoms get worse all of a sudden. Summary Edema is when you have too much fluid in your body or under your skin. Edema may make your legs, feet, and ankles swell up. Swelling is also common in looser tissues, like around your eyes. Raise (elevate) the swollen body part above the level of your heart when you are sitting or lying  down. Follow your doctor's instructions about diet and how much fluid you can drink (fluid restriction). This information is not intended to replace advice given to you by your health care provider. Make sure you discuss any questions you have with your healthcare provider. Document Revised: 04/30/2020 Document Reviewed: 04/30/2020 Elsevier Patient Education  2022 Elsevier Inc.  

## 2021-02-23 ENCOUNTER — Telehealth: Payer: Self-pay

## 2021-02-23 MED ORDER — FLUTICASONE PROPIONATE HFA 110 MCG/ACT IN AERO: 2.0000 | INHALATION_SPRAY | Freq: Two times a day (BID) | RESPIRATORY_TRACT | 3 refills | Status: DC

## 2021-02-23 NOTE — Telephone Encounter (Signed)
Patient's insurance prefers Flovent Diskus/HFA.  If appropriate can the Qvar be changed to Flovent?

## 2021-02-23 NOTE — Addendum Note (Signed)
Addended by: [name] on: 02/23/2021 05:44 PM   Modules accepted: Orders

## 2021-02-23 NOTE — Telephone Encounter (Signed)
Done

## 2021-02-24 DIAGNOSIS — I110 Hypertensive heart disease with heart failure: Secondary | ICD-10-CM | POA: Diagnosis not present

## 2021-02-24 DIAGNOSIS — I5032 Chronic diastolic (congestive) heart failure: Secondary | ICD-10-CM | POA: Diagnosis not present

## 2021-02-26 DIAGNOSIS — H0102A Squamous blepharitis right eye, upper and lower eyelids: Secondary | ICD-10-CM | POA: Diagnosis not present

## 2021-02-26 DIAGNOSIS — E113513 Type 2 diabetes mellitus with proliferative diabetic retinopathy with macular edema, bilateral: Secondary | ICD-10-CM | POA: Diagnosis not present

## 2021-02-26 DIAGNOSIS — E113511 Type 2 diabetes mellitus with proliferative diabetic retinopathy with macular edema, right eye: Secondary | ICD-10-CM | POA: Diagnosis not present

## 2021-02-26 DIAGNOSIS — H0102B Squamous blepharitis left eye, upper and lower eyelids: Secondary | ICD-10-CM | POA: Diagnosis not present

## 2021-02-26 DIAGNOSIS — Z961 Presence of intraocular lens: Secondary | ICD-10-CM | POA: Diagnosis not present

## 2021-03-09 ENCOUNTER — Other Ambulatory Visit: Payer: Self-pay

## 2021-03-09 ENCOUNTER — Other Ambulatory Visit: Payer: Self-pay | Admitting: Obstetrics and Gynecology

## 2021-03-09 NOTE — Patient Instructions (Signed)
Hi [name] [name], thank you for speaking with me today-have fun on you trip.  [name] [name] was given information about Medicaid Managed Care team care coordination services as a part of their [place] Medicaid benefit. [name] [name] verbally consented to engagement with the [facility] Managed Care team.   If you are experiencing a medical emergency, please call 911 or report to your local emergency department or urgent care.   If you have a non-emergency medical problem during routine business hours, please contact your provider's office and ask to speak with a nurse.   For questions related to your [facility], please call: [phone] or visit the homepage here: [email or web address]  If you would like to schedule transportation through your [facility], please call the following number at least 2 days in advance of your appointment: [phone].   Call the [facility] at [phone], at any time, 24 hours a day, 7 days a week. If you are in danger or need immediate medical attention call 911.  If you would like help to quit smoking, call 1-800-QUIT-NOW (805)747-9725) OR Espaol: 1-855-Djelo-Ya (1-749-449-6759) o para ms informacin haga clic aqu or Text READY to 200-400 to register via text  [name] [name] - following are the goals we discussed in your visit today:   Goals Addressed             This Visit's Progress    Protect My Health       Timeframe:  Long-Range Goal Priority:  Medium Start Date:          10/29/20                   Expected End Date: ongoing             Follow Up Date: 04/09/21   - schedule appointment for flu shot - schedule appointment for vaccines needed due to my age or health - schedule recommended health tests (blood work, mammogram, colonoscopy, pap test) - schedule and keep appointment for annual check-up   Update 11/28/20:  Patient to schedule follow up appointment with PCP. Update 12/24/20:  Patient has appointment 01/20/21 with PCP.  Needs to schedule appointment with cardiology and nephrology. Update 03/09/21:  Patient has follow up appointment with PCP 04/01/21.  Why is this important?   Screening tests can find diseases early when they are easier to treat.  Your doctor or nurse will talk with you about which tests are important for you.  Getting shots for common diseases like the flu and shingles will help prevent them.       The patient verbalized understanding of instructions provided today and declined a print copy of patient instruction materials.   The Managed Medicaid care management team will reach out to the patient again over the next 30 days.  The  Patient has been provided with contact information for the Managed Medicaid care management team and has been advised to call with any health related questions or concerns.   [name] Raider RN, BSN [place]  [facility] - Managed Medicaid High Risk [phone].   Following is a copy of your plan of care:  Patient Care Plan: General Plan of Care (Adult)     Problem Identified: Therapeutic Alliance (General Plan of Care)   Priority: Medium  Onset Date: 05/28/2020     Long-Range Goal: Therapeutic Alliance Established   Start Date: 10/29/2020  Expected End Date:  06/09/2021  Recent Progress: On track  Priority: Medium  Note:   Evidence-based guidance:  Avoid value judgments; convey acceptance.  Encourage collaboration with the treatment team.  Establish rapport; develop trust relationship.  Therapist, art.  Provide emotional support; encourage patient to share feelings of anger, fear and anxiety.  Promote self-reliance and autonomy based on age and ability; discourage overprotection.  Use empathy and nonjudgmental, participatory manner.     Problem Identified: Health Promotion  or Disease Self-Management (General Plan of Care)   Priority: High  Onset Date: 05/28/2020     Long-Range Goal: Self-Management Plan Developed   Start Date: 10/29/2020  Expected End Date: 06/09/2021  Recent Progress: On track  Priority: Medium  Note:   Evidence-based guidance:  Review biopsychosocial determinants of health screens.  Determine level of modifiable health risk.  Assess level of patient activation, level of readiness, importance and confidence to make changes.  Evoke change talk using open-ended questions, pros and cons, as well as looking forward.  Identify areas where behavior change may lead to improved health.  Partner with patient to develop a robust self-management plan that includes lifestyle factors, such as weight loss, exercise and healthy nutrition, as well as goals specific to disease risks.  Support patient and family/caregiver active participation in decision-making and self-management plan.  Implement additional goals and interventions based on identified risk factors to reduce health risk.  Facilitate advance care planning.  Review need for preventive screening based on age, sex, family history and health history.     Problem Identified: Coping Skills (General Plan of Care)   Priority: High  Onset Date: 10/29/2020     Long-Range Goal: Coping Skills Enhanced   Start Date: 10/29/2020  Expected End Date: 06/09/2021  Recent Progress: On track  Priority: Medium  Note:   Evidence-based guidance:  Acknowledge, normalize and validate difficulty of making life-long lifestyle changes.  Identify current effective and ineffective coping strategies.  Encourage patient and caregiver participation in care to increase self-esteem, confidence and feelings of control.  Consider alternative and complementary therapy approaches such as meditation, mindfulness or yoga.  Encourage participation in cognitive behavioral therapy to foster a positive identity, increase  self-awareness, as well as bolster self-esteem, confidence and self-efficacy.  Discuss spirituality; be present as concerns are identified; encourage journaling, prayer, worship services, meditation or pastoral counseling.  Encourage participation in pleasurable group activities such as hobbies, singing, sports or volunteering).  Encourage the use of mindfulness; refer for training or intensive intervention.  Consider the use of meditative movement therapy such as tai chi, yoga or qigong.  Promote a regular daily exercise program based on tolerance, ability and patient choice to support positive thinking about disease or aging.     Problem Identified: Quality of Life (General Plan of Care)   Priority: Medium  Onset Date: 10/29/2020     Long-Range Goal: Quality of Life Maintained   Start Date: 10/29/2020  Expected End Date: 06/09/2021  Recent Progress: Not on track  Priority: Medium  Note:    CARE PLAN ENTRY Medicaid Managed Care (see longitudinal plan of care for additional care plan information)  Current Barriers:  Patient states leg swelling-recently hospitalized. Update 10/29/20:  Patient states leg swelling improved.  Needs follow up appointments with cardiologist and nephrologist. Update 11/28/20:  Patient denies any leg swelling-has not seen cardiologist or nephrologist-plans on scheduling appointment with PCP. Update 12/24/20:  Patient with an increase in leg swelling and trouble breathing-has used inhaler.  States she called PCP office  Friday and Monday and no one has called her back-RNCM called PCP office with patient on the phone for 3 way call-they are unable to see her today and directed her to the ER.  Patient still needs to schedule cardiology and nephrology appointment. Update 03/09/21:  Patient states she feels fine today, is getting ready to go on a cruise to the [place] for a week.  States leg swelling comes and goes and wears compression hose/stocking as needed.  Nurse Case  Manager Clinical Goal(s):  Over the next 30 days, patient will verbalize understanding of plan for decreased swelling.  Update 06/10/20:  patient states swelling has decreased with compression hose/stockings Over the next 30 days, patient will work with provider to address needs related to swelling.  Update 06/10/20:  patient was seen by her PCP, Dr. Margarita Rana, 06/03/20. Over the next 30 days, patient will schedule follow up appointments with cardiologist and nephrologist. Update 11/28/20:  Patient has not scheduled appointments with cardiologist or nephrologist yet-plans on scheduling appointment with PCP. Update 03/09/21:  Patient has follow up appointment 04/01/21 with Dr. Margarita Rana, no other appointments. Over the next 30 days, patient will continue to work with Pharmacist. Update 11/28/20:  Pharmacy continues to follow. Update 12/24/20:  patient with increased leg swelling and SOB, was hospitalized approximately 2 weeks ago.  PCP office recommended patient proceed to ER. Update 03/09/21:  No complaints today.  Interventions:  Inter-disciplinary care team collaboration (see longitudinal plan of care) Reviewed medications with patient. Discussed plans with patient for ongoing care management follow up and provided patient with direct contact information for care management team. Collaboration with Pharmacist for review of medications.  Plan:  Patient will follow up with provider and fill all prescriptions RNCM will follow up with patient within 30 days.

## 2021-03-09 NOTE — Patient Outreach (Signed)
Medicaid Managed Care   Nurse Care Manager Note  03/09/2021 Name:  [name] [name] MRN:  [id number] DOB:  02-23-1961  [name] [name] is an 60 y.o. year old female who is a primary patient of [name] Rakes, MD.  The [facility] Managed Care Coordination team was consulted for assistance with:    Chronic healthcare management needs.  Ms. Wollenberg was given information about Medicaid Managed Care Coordination team services today. [name] [name] Patient agreed to services and verbal consent obtained.  Engaged with patient by telephone for follow up visit in response to provider referral for case management and/or care coordination services.   Assessments/Interventions:  Review of past medical history, allergies, medications, health status, including review of consultants reports, laboratory and other test data, was performed as part of comprehensive evaluation and provision of chronic care management services.  SDOH (Social Determinants of Health) assessments and interventions performed: SDOH Interventions    Flowsheet Row Most Recent Value  SDOH Interventions   Housing Interventions Intervention Not Indicated  Intimate Partner Violence Interventions Intervention Not Indicated  Transportation Interventions Intervention Not Indicated       Care Plan  Allergies  Allergen Reactions   Lisinopril Swelling    Medications Reviewed Today     Reviewed by Gayla Medicus, RN (Registered Nurse) on 03/09/21 at 1438  Med List Status: <None>   Medication Order Taking? Sig Documenting Provider Last Dose Status Informant  albuterol (PROVENTIL) (2.5 MG/3ML) 0.083% nebulizer solution [id number] No Take 3 mLs (2.5 mg total) by nebulization every 4 (four) hours as needed for wheezing or shortness of breath. [name], [name] A, PA-C Taking Active   albuterol (VENTOLIN HFA) 108 (90 Base) MCG/ACT inhaler [id number] No Inhale 1 puff into the lungs every 4 (four) hours as needed for wheezing or shortness of  breath. [name] Rakes, MD Taking Active Self  amLODipine (NORVASC) 10 MG tablet [id number] No Take 1 tablet (10 mg total) by mouth daily. [name] Graff, FNP Taking Active Self  atorvastatin (LIPITOR) 40 MG tablet [id number] No Take 1 tablet (40 mg total) by mouth every morning. Must have HF appt for further refills [name] Graff, FNP Taking Active Self  blood glucose meter kit and supplies KIT [id number] No Dispense based on patient and insurance preference. Use up to four times daily as directed. (FOR ICD-9 250.00, 250.01). [name] Flock, MD Taking Active Self  ferrous sulfate 325 (65 FE) MG tablet [id number] No Take 325 mg by mouth daily with breakfast. [provider] Taking Active Self  fluticasone (FLOVENT HFA) 110 MCG/ACT inhaler [id number]  Inhale 2 puffs into the lungs 2 (two) times daily. [name] Rakes, MD  Active   furosemide (LASIX) 40 MG tablet [id number] No Take 1 tablet (40 mg total) by mouth 2 (two) times daily. [name] [name], [place] 12/04/2020 AM Expired 12/04/20 2359 Self  furosemide (LASIX) 40 MG tablet [id number] No Take 40 mg by mouth 2 (two) times daily. [provider] Taking Active Self  furosemide (LASIX) 40 MG tablet [id number]  Take 1 tablet (40 mg total) by mouth daily for 5 days. [name], [name] A, PA-C  Expired 02/17/21 2359   hydrALAZINE (APRESOLINE) 25 MG tablet [id number] No Take 25 mg by mouth 3 (three) times daily. [provider] Taking Active Self  labetalol (NORMODYNE) 300 MG tablet [id number] No TAKE ONE TABLET BY MOUTH EVERY MORNING and TAKE ONE TABLET BY MOUTH EVERY [name] Fuller, MD Taking Active Self  LANTUS SOLOSTAR 100 UNIT/ML Solostar Pen [id number] No INJECT 60  UNITS SUBCUTANEOUSLY EVERYDAY AT BEDTIME [name] Rakes, MD Taking Active Self  pregabalin (LYRICA) 75 MG capsule [id number] No TAKE ONE CAPSULE BY MOUTH EVERY MORNING and TAKE ONE CAPSULE BY MOUTH EVERY [name] Fuller, MD Taking Active Self  VICTOZA 18  MG/3ML SOPN [id number] No INJECT 1.107m into THE SKIN DAILY AS DIRECTED NCharlott Rakes MD Taking Active Self  Vitamin D, Ergocalciferol, (DRISDOL) 1.25 MG (50000 UNIT) CAPS capsule [id number] Take 1 capsule by mouth once a week. [provider] Taking Active Self            Patient Active Problem List   Diagnosis Date Noted   COPD exacerbation ([facility] 12/05/2020   COPD with acute exacerbation ([facility] 12/04/2020   CKD stage 3 due to type 2 diabetes mellitus ([facility] 12/04/2020   Anemia of chronic disease 12/04/2020   Class 3 obesity 07/27/2020   Acute respiratory disease due to COVID-19 virus 07/25/2020   Acute hypoxemic respiratory failure due to COVID-19 ([facility] 07/25/2020   Morbid obesity ([facility] 11/23/2019   Acute respiratory failure with hypoxia ([facility] 11/06/2019   Chronic kidney disease, stage 3b ([facility] 11/06/2019   COPD with acute bronchitis (HCC)    Acute kidney failure ([facility] 08/18/2019   Left ventricular hypertrophy 07/21/2019   Educated about COVID-19 virus infection 07/21/2019   Acute on chronic diastolic heart failure ([facility]    Hypoxia    Dyspnea on exertion 05/09/2019   Sleep apnea 05/09/2019   Hyperglycemia 04/09/2019   Snoring 03/13/2019   Symptomatic anemia 08/17/2018   Acute renal failure (ARF) ([facility] 08/17/2018   Chronic cystitis 08/03/2018   Diabetic neuropathy ([facility] 06/16/2017   Non compliance w medication regimen 04/12/2017   Family history of colon cancer in mother 11/17/2016   Dyspareunia in female 11/11/2016   Screen for colon cancer 11/11/2016   History of ovarian cyst 11/11/2016   Chronic hepatitis C without hepatic coma ([facility] 11/09/2016   Hyperlipidemia 10/07/2016   Insulin dependent type 2 diabetes mellitus ([facility] 01/29/2014   Status post intraocular lens implant 11/08/2013   PCO (posterior capsular opacification) 09/28/2013   Pseudophakia 09/12/2013   GERD (gastroesophageal reflux disease) 09/06/2013   Hypertension 09/06/2013   BREAST PAIN,  BILATERAL 02/19/2010   ANEMIA, IRON DEFICIENCY 10/03/2009   LEUKOPENIA, MILD 09/19/2009   Chest pain 09/19/2009   LIPOMA 06/18/2009   EUSTACHIAN TUBE DYSFUNCTION 06/18/2009   TINEA PEDIS 05/07/2009   SKIN LESION 05/07/2009   COUGH 05/07/2009   SUBSTANCE ABUSE, MULTIPLE 02/22/2007   HCVD (hypertensive cardiovascular disease) 02/22/2007    Conditions to be addressed/monitored per PCP order:   chronic healthcare management needs, DM2, OSA, CHF, CKD, HLD, HTN, GED, COPD  Care Plan : General Plan of Care (Adult)  Updates made by CGayla Medicus RN since 03/09/2021 12:00 AM     Problem: Therapeutic Alliance (General Plan of Care)   Priority: Medium  Onset Date: 05/28/2020     Long-Range Goal: Therapeutic Alliance Established   Start Date: 10/29/2020  Expected End Date: 06/09/2021  Recent Progress: On track  Priority: Medium  Note:   Evidence-based guidance:  Avoid value judgments; convey acceptance.  Encourage collaboration with the treatment team.  Establish rapport; develop trust relationship.  HTherapist, art  Provide emotional support; encourage patient to share feelings of anger, fear and anxiety.  Promote self-reliance and autonomy based on age and ability; discourage overprotection.  Use empathy and nonjudgmental, participatory manner.     Problem: Health Promotion or Disease Self-Management (General Plan of Care)  Priority: High  Onset Date: 05/28/2020     Long-Range Goal: Self-Management Plan Developed   Start Date: 10/29/2020  Expected End Date: 06/09/2021  Recent Progress: On track  Priority: Medium  Note:   Evidence-based guidance:  Review biopsychosocial determinants of health screens.  Determine level of modifiable health risk.  Assess level of patient activation, level of readiness, importance and confidence to make changes.  Evoke change talk using open-ended questions, pros and cons, as well as looking forward.  Identify areas where behavior  change may lead to improved health.  Partner with patient to develop a robust self-management plan that includes lifestyle factors, such as weight loss, exercise and healthy nutrition, as well as goals specific to disease risks.  Support patient and family/caregiver active participation in decision-making and self-management plan.  Implement additional goals and interventions based on identified risk factors to reduce health risk.  Facilitate advance care planning.  Review need for preventive screening based on age, sex, family history and health history.   Problem: Coping Skills (General Plan of Care)   Priority: High  Onset Date: 10/29/2020     Long-Range Goal: Coping Skills Enhanced   Start Date: 10/29/2020  Expected End Date: 06/09/2021  Recent Progress: On track  Priority: Medium  Note:   Evidence-based guidance:  Acknowledge, normalize and validate difficulty of making life-long lifestyle changes.  Identify current effective and ineffective coping strategies.  Encourage patient and caregiver participation in care to increase self-esteem, confidence and feelings of control.  Consider alternative and complementary therapy approaches such as meditation, mindfulness or yoga.  Encourage participation in cognitive behavioral therapy to foster a positive identity, increase self-awareness, as well as bolster self-esteem, confidence and self-efficacy.  Discuss spirituality; be present as concerns are identified; encourage journaling, prayer, worship services, meditation or pastoral counseling.  Encourage participation in pleasurable group activities such as hobbies, singing, sports or volunteering).  Encourage the use of mindfulness; refer for training or intensive intervention.  Consider the use of meditative movement therapy such as tai chi, yoga or qigong.  Promote a regular daily exercise program based on tolerance, ability and patient choice to support positive thinking about disease or  aging.     Problem: Quality of Life (General Plan of Care)   Priority: Medium  Onset Date: 10/29/2020     Long-Range Goal: Quality of Life Maintained   Start Date: 10/29/2020  Expected End Date: 06/09/2021  Recent Progress: Not on track  Priority: Medium  Note:    CARE PLAN ENTRY Medicaid Managed Care (see longitudinal plan of care for additional care plan information)  Current Barriers:  Patient states leg swelling-recently hospitalized. Update 10/29/20:  Patient states leg swelling improved.  Needs follow up appointments with cardiologist and nephrologist. Update 11/28/20:  Patient denies any leg swelling-has not seen cardiologist or nephrologist-plans on scheduling appointment with PCP. Update 12/24/20:  Patient with an increase in leg swelling and trouble breathing-has used inhaler.  States she called PCP office Friday and Monday and no one has called her back-RNCM called PCP office with patient on the phone for 3 way call-they are unable to see her today and directed her to the ER.  Patient still needs to schedule cardiology and nephrology appointment. Update 03/09/21:  Patient states she feels fine today, is getting ready to go on a cruise to the [place] for a week.  States leg swelling comes and goes and wears compression hose/stocking as needed.  Nurse Case Manager Clinical Goal(s):  Over the next 30  days, patient will verbalize understanding of plan for decreased swelling.  Update 06/10/20:  patient states swelling has decreased with compression hose/stockings Over the next 30 days, patient will work with provider to address needs related to swelling.  Update 06/10/20:  patient was seen by her PCP, Dr. Margarita Rana, 06/03/20. Over the next 30 days, patient will schedule follow up appointments with cardiologist and nephrologist. Update 11/28/20:  Patient has not scheduled appointments with cardiologist or nephrologist yet-plans on scheduling appointment with PCP. Update 03/09/21:  Patient has  follow up appointment 04/01/21 with Dr. Margarita Rana, no other appointments. Over the next 30 days, patient will continue to work with Pharmacist. Update 11/28/20:  Pharmacy continues to follow. Update 12/24/20:  patient with increased leg swelling and SOB, was hospitalized approximately 2 weeks ago.  PCP office recommended patient proceed to ER. Update 03/09/21:  No complaints today.  Interventions:  Inter-disciplinary care team collaboration (see longitudinal plan of care) Reviewed medications with patient. Discussed plans with patient for ongoing care management follow up and provided patient with direct contact information for care management team. Collaboration with Pharmacist for review of medications.  Plan:  Patient will follow up with provider and fill all prescriptions RNCM will follow up with patient within 30 days.   Evidence-based guidance:  Assess patient's thoughts about quality of life, goals and expectations, and dissatisfaction or desire to improve.  Identify issues of primary importance such as mental health, illness, exercise tolerance, pain, sexual function and intimacy, cognitive change, social isolation, finances and relationships.  Assess and monitor for signs/symptoms of psychosocial concerns, especially depression or ideations regarding harm to others or self; provide or refer for mental health services as needed.  Identify sensory issues that impact quality of life such as hearing loss, vision deficit; strategize ways to maintain or improve hearing, vision.  Promote access to services in the community to support independence such as support groups, home visiting programs, financial assistance, handicapped parking tags, durable medical equipment and emergency responder.  Promote activities to decrease social isolation such as group support or social, leisure and recreational activities, employment, use of social media; consider safety concerns about being out of home for  activities.  Provide patient an opportunity to share by storytelling or a "life review" to give positive meaning to life and to assist with coping and negative experiences.  Encourage patient to tap into hope to improve sense of self.  Counsel based on prognosis and as early as possible about end-of-life and palliative care; consider referral to palliative care provider.  Advocate for the development of palliative care plan that may include avoidance of unnecessary testing and intervention, symptom control, discontinuation of medications, hospice and organ donation.  Counsel as early as possible those with life-limiting chronic disease about palliative care; consider referral to palliative care provider.  Advocate for the development of palliative care plan.      Follow Up:  Patient agrees to Care Plan and Follow-up.  Plan: The Managed Medicaid care management team will reach out to the patient again over the next 30 days. and The patient has been provided with contact information for the Managed Medicaid care management team and has been advised to call with any health related questions or concerns.  Date/time of next scheduled RN care management/care coordination outreach:  04/08/21 at 0900.

## 2021-03-10 ENCOUNTER — Other Ambulatory Visit: Payer: Self-pay | Admitting: Family Medicine

## 2021-03-10 DIAGNOSIS — Z794 Long term (current) use of insulin: Secondary | ICD-10-CM

## 2021-03-10 DIAGNOSIS — E1149 Type 2 diabetes mellitus with other diabetic neurological complication: Secondary | ICD-10-CM

## 2021-03-10 DIAGNOSIS — E11311 Type 2 diabetes mellitus with unspecified diabetic retinopathy with macular edema: Secondary | ICD-10-CM

## 2021-03-13 ENCOUNTER — Other Ambulatory Visit: Payer: Self-pay | Admitting: Family

## 2021-03-13 ENCOUNTER — Other Ambulatory Visit: Payer: Self-pay | Admitting: Family Medicine

## 2021-03-13 DIAGNOSIS — E1149 Type 2 diabetes mellitus with other diabetic neurological complication: Secondary | ICD-10-CM

## 2021-03-13 DIAGNOSIS — Z794 Long term (current) use of insulin: Secondary | ICD-10-CM

## 2021-03-13 DIAGNOSIS — E11311 Type 2 diabetes mellitus with unspecified diabetic retinopathy with macular edema: Secondary | ICD-10-CM

## 2021-03-13 DIAGNOSIS — I110 Hypertensive heart disease with heart failure: Secondary | ICD-10-CM

## 2021-03-26 ENCOUNTER — Encounter (HOSPITAL_COMMUNITY): Payer: Self-pay

## 2021-03-26 ENCOUNTER — Other Ambulatory Visit: Payer: Self-pay

## 2021-03-26 ENCOUNTER — Emergency Department (HOSPITAL_COMMUNITY)
Admission: EM | Admit: 2021-03-26 | Discharge: 2021-03-26 | Disposition: A | Discharge status: Home / Self Care | Payer: Medicaid Other | Attending: Emergency Medicine | Admitting: Emergency Medicine

## 2021-03-26 DIAGNOSIS — I509 Heart failure, unspecified: Secondary | ICD-10-CM | POA: Diagnosis not present

## 2021-03-26 DIAGNOSIS — Z79899 Other long term (current) drug therapy: Secondary | ICD-10-CM | POA: Diagnosis not present

## 2021-03-26 DIAGNOSIS — I130 Hypertensive heart and chronic kidney disease with heart failure and stage 1 through stage 4 chronic kidney disease, or unspecified chronic kidney disease: Secondary | ICD-10-CM | POA: Insufficient documentation

## 2021-03-26 DIAGNOSIS — E1122 Type 2 diabetes mellitus with diabetic chronic kidney disease: Secondary | ICD-10-CM | POA: Diagnosis not present

## 2021-03-26 DIAGNOSIS — Z794 Long term (current) use of insulin: Secondary | ICD-10-CM | POA: Diagnosis not present

## 2021-03-26 DIAGNOSIS — N183 Chronic kidney disease, stage 3 unspecified: Secondary | ICD-10-CM | POA: Insufficient documentation

## 2021-03-26 DIAGNOSIS — J449 Chronic obstructive pulmonary disease, unspecified: Secondary | ICD-10-CM | POA: Insufficient documentation

## 2021-03-26 DIAGNOSIS — D631 Anemia in chronic kidney disease: Secondary | ICD-10-CM | POA: Insufficient documentation

## 2021-03-26 DIAGNOSIS — R2231 Localized swelling, mass and lump, right upper limb: Secondary | ICD-10-CM | POA: Diagnosis not present

## 2021-03-26 DIAGNOSIS — M7989 Other specified soft tissue disorders: Secondary | ICD-10-CM

## 2021-03-26 MED ORDER — DOXYCYCLINE HYCLATE 100 MG PO CAPS: 100.0000 mg | ORAL_CAPSULE | Freq: Two times a day (BID) | ORAL | 0 refills | Status: DC

## 2021-03-26 NOTE — ED Provider Notes (Signed)
[facility]-EMERGENCY DEPT Provider Note   CSN: [id number] Arrival date & time: 03/26/21  2020     History Chief Complaint  Patient presents with   Skin Issue    [name] [name] is a 60 y.o. female who presents to the emergency department for a right armpit swelling that began 3 days ago.  She has not had any symptoms like this in the past.  She reports that the swelling was draining greenish bloody material this morning.  She denies any fever, chills, cough, congestion.  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   Qualifier: Diagnosis of  By: Weaver PA-C, Scott     Hypertension    MRSA (methicillin resistant Staphylococcus aureus)    Trichomonas    VAGINITIS, BACTERIAL, RECURRENT 08/15/2007   Qualifier: Diagnosis of  By: Rankins MD, Victoria      Patient Active Problem List   Diagnosis Date Noted   COPD exacerbation (HCC) 12/05/2020   COPD with acute exacerbation (HCC) 12/04/2020   CKD stage 3 due to type 2 diabetes mellitus (HCC) 12/04/2020   Anemia of chronic disease 12/04/2020   Class 3 obesity 07/27/2020   Acute respiratory disease due to COVID-19 virus 07/25/2020   Acute hypoxemic respiratory failure due to COVID-19 (HCC) 07/25/2020   Morbid obesity (HCC) 11/23/2019   Acute respiratory failure with hypoxia (HCC) 11/06/2019   Chronic kidney disease, stage 3b (HCC) 11/06/2019   COPD with acute bronchitis (HCC)    Acute kidney failure (HCC) 08/18/2019   Left ventricular hypertrophy 07/21/2019   Educated about COVID-19 virus infection 07/21/2019   Acute on chronic diastolic heart failure (HCC)    Hypoxia    Dyspnea on exertion 05/09/2019   Sleep apnea 05/09/2019   Hyperglycemia 04/09/2019   Snoring 03/13/2019   Symptomatic anemia 08/17/2018   Acute renal failure (ARF) (HCC)  08/17/2018   Chronic cystitis 08/03/2018   Diabetic neuropathy (HCC) 06/16/2017   Non compliance w medication regimen 04/12/2017   Family history of colon cancer in mother 11/17/2016   Dyspareunia in female 11/11/2016   Screen for colon cancer 11/11/2016   History of ovarian cyst 11/11/2016   Chronic hepatitis C without hepatic coma (HCC) 11/09/2016   Hyperlipidemia 10/07/2016   Insulin dependent type 2 diabetes mellitus (HCC) 01/29/2014   Status post intraocular lens implant 11/08/2013   PCO (posterior capsular opacification) 09/28/2013   Pseudophakia 09/12/2013   GERD (gastroesophageal reflux disease) 09/06/2013   Hypertension 09/06/2013   BREAST PAIN, BILATERAL 02/19/2010   ANEMIA, IRON DEFICIENCY 10/03/2009   LEUKOPENIA, MILD 09/19/2009   Chest pain 09/19/2009   LIPOMA 06/18/2009   EUSTACHIAN TUBE DYSFUNCTION 06/18/2009   TINEA PEDIS 05/07/2009   SKIN LESION 05/07/2009   COUGH 05/07/2009   SUBSTANCE ABUSE, MULTIPLE 02/22/2007   HCVD (hypertensive cardiovascular disease) 02/22/2007    Past Surgical History:  Procedure Laterality Date   BREAST BIOPSY Left 2018   CESAREAN SECTION     breech     OB History     Gravida  1   Para  1   Term  1   Preterm      AB      Living  1      SAB      IAB        Ectopic      Multiple      Live Births  1           Family History  Problem Relation Age of Onset   Colon cancer Mother    Liver disease Sister    Other Neg Hx    Breast cancer Neg Hx    Esophageal cancer Neg Hx    Rectal cancer Neg Hx     Social History   Tobacco Use   Smoking status: Never   Smokeless tobacco: Never  Vaping Use   Vaping Use: Never used  Substance Use Topics   Alcohol use: No   Drug use: No    Types: Cocaine, Marijuana    Comment: remote h/o cocaine and marijuana use    Home Medications Prior to Admission medications   Medication Sig Start Date End Date Taking? Authorizing Provider  doxycycline (VIBRAMYCIN) 100 MG  capsule Take 1 capsule (100 mg total) by mouth 2 (two) times daily. 03/26/21  Yes Fleming, Conner M, PA-C  albuterol (PROVENTIL) (2.5 MG/3ML) 0.083% nebulizer solution Take 3 mLs (2.5 mg total) by nebulization every 4 (four) hours as needed for wheezing or shortness of breath. 02/12/21   Henderly, Britni A, PA-C  albuterol (VENTOLIN HFA) 108 (90 Base) MCG/ACT inhaler Inhale 1 puff into the lungs every 4 (four) hours as needed for wheezing or shortness of breath. 12/10/20   Newlin, Enobong, MD  amLODipine (NORVASC) 10 MG tablet Take 1 tablet (10 mg total) by mouth daily. 09/03/20   Hackney, Tina A, FNP  atorvastatin (LIPITOR) 40 MG tablet Take 1 tablet (40 mg total) by mouth every morning. Must have HF appt for further refills 12/09/20   Hackney, Tina A, FNP  blood glucose meter kit and supplies KIT Dispense based on patient and insurance preference. Use up to four times daily as directed. (FOR ICD-9 250.00, 250.01). 04/12/19   Patel, Shreyang, MD  ferrous sulfate 325 (65 FE) MG tablet Take 325 mg by mouth daily with breakfast.    [provider]  fluticasone (FLOVENT HFA) 110 MCG/ACT inhaler Inhale 2 puffs into the lungs 2 (two) times daily. 02/23/21   Newlin, Enobong, MD  furosemide (LASIX) 40 MG tablet Take 1 tablet (40 mg total) by mouth 2 (two) times daily. 12/05/19 12/04/20  Hackney, Tina A, FNP  furosemide (LASIX) 40 MG tablet Take 40 mg by mouth 2 (two) times daily.    [provider]  furosemide (LASIX) 40 MG tablet Take 1 tablet (40 mg total) by mouth daily for 5 days. 02/12/21 02/17/21  Henderly, Britni A, PA-C  hydrALAZINE (APRESOLINE) 25 MG tablet Take 25 mg by mouth 3 (three) times daily. 06/26/20   [provider]  labetalol (NORMODYNE) 300 MG tablet TAKE ONE TABLET BY MOUTH TWICE DAILY 03/13/21   Newlin, Enobong, MD  LANTUS SOLOSTAR 100 UNIT/ML Solostar Pen INJECT 60 UNITS SUBCUTANEOUSLY EVERYDAY AT BEDTIME 03/13/21   Newlin, Enobong, MD  pregabalin (LYRICA) 75 MG capsule TAKE  ONE CAPSULE BY MOUTH EVERY MORNING and TAKE ONE CAPSULE BY MOUTH EVERY EVENING 12/10/20   Newlin, Enobong, MD  VICTOZA 18 MG/3ML SOPN INJECT 1.8mg into THE SKIN DAILY AS DIRECTED 03/13/21   Newlin, Enobong, MD  Vitamin D, Ergocalciferol, (DRISDOL) 1.25 MG (50000 UNIT) CAPS capsule Take 1 capsule by mouth once a week. 09/09/20   [provider]    Allergies    Lisinopril  Review of Systems   Review of Systems  All other systems reviewed   and are negative.  Physical Exam Updated Vital Signs BP (!) 149/84 (BP Location: Left Arm)   Pulse 90   Temp 98.7 F (37.1 C) (Oral)   Resp (!) 22   Ht 5' 1" (1.549 m)   Wt 112.5 kg   LMP 04/01/2010   SpO2 92%   BMI 46.86 kg/m   Physical Exam Vitals reviewed.  Constitutional:      Appearance: Normal appearance.  HENT:     Head: Normocephalic and atraumatic.  Eyes:     General:        Right eye: No discharge.        Left eye: No discharge.     Conjunctiva/sclera: Conjunctivae normal.  Pulmonary:     Effort: Pulmonary effort is normal.  Skin:    General: Skin is warm and dry.     Findings: No rash.     Comments: There is a 6 cm area of swelling over the the right armpit.  There does not appear to be any induration or fluctuance at the area.  The area is not warm to palpation.  Neurological:     General: No focal deficit present.     Mental Status: She is alert.  Psychiatric:        Mood and Affect: Mood normal.        Behavior: Behavior normal.    ED Results / Procedures / Treatments   Labs (all labs ordered are listed, but only abnormal results are displayed) Labs Reviewed - No data to display  EKG None  Radiology No results found.  Procedures Procedures   Medications Ordered in ED Medications - No data to display  ED Course  I have reviewed the triage vital signs and the nursing notes.  Pertinent labs & imaging results that were available during my care of the patient were reviewed by me and considered in my  medical decision making (see chart for details).    MDM Rules/Calculators/A&P                         [name] [name] is a 60 y.o. female who was seen in the emergency department today for further evaluation of her right armpit swelling.  There does not appear to be any abscess formation at this current stage.  Since it drained this morning it is likely that majority of the purulent material was removed.  I will empirically treat her to prevent further infection with doxycycline.  She has been afebrile and hemodynamically stable throughout the ED course.  Strict return precautions given.  Will have her follow-up with her primary care provider within 1 week for reevaluation.  Final Clinical Impression(s) / ED Diagnoses Final diagnoses:  Swelling in right armpit    Rx / DC Orders ED Discharge Orders          Ordered    doxycycline (VIBRAMYCIN) 100 MG capsule  2 times daily        03/26/21 2054             Fleming, Conner M, PA-C 03/26/21 2059    Knapp, Jon, MD 03/28/21 1531  

## 2021-03-26 NOTE — Discharge Instructions (Addendum)
You were seen and evaluated in the emergency department today for possible skin abscess.  As we discussed, there does not appear to be any signs of abscess formation at this time.  It is likely that the drainage this morning was some of the material from that armpit.  I am going to put you on doxycycline to prevent further infection.  Please return to the emergency department if you are experiencing worsening pain, swelling, increased drainage, fever, chills, or any other concerns you might have.  Please follow-up with your primary care provider within the next week for reevaluation.

## 2021-03-26 NOTE — ED Triage Notes (Signed)
Right skin tag that appeared this weekend. Pt request removal.

## 2021-03-27 ENCOUNTER — Telehealth: Payer: Self-pay

## 2021-03-27 NOTE — Telephone Encounter (Signed)
Transition Care Management Unsuccessful Follow-up Telephone Call  Date of discharge and from where:  03/26/2021-[facility]  Attempts:  1st Attempt  Reason for unsuccessful TCM follow-up call:  Left voice message

## 2021-03-30 NOTE — Telephone Encounter (Signed)
Transition Care Management Unsuccessful Follow-up Telephone Call  Date of discharge and from where:  03/26/2021 from [facility]  Attempts:  2nd Attempt  Reason for unsuccessful TCM follow-up call:  Left voice message

## 2021-03-31 NOTE — Telephone Encounter (Signed)
Transition Care Management Unsuccessful Follow-up Telephone Call  Date of discharge and from where:  03/26/2021 from [facility]  Attempts:  3rd Attempt  Reason for unsuccessful TCM follow-up call:  Unable to reach patient

## 2021-04-01 ENCOUNTER — Other Ambulatory Visit (HOSPITAL_COMMUNITY)
Admission: RE | Admit: 2021-04-01 | Discharge: 2021-04-01 | Disposition: A | Discharge status: Home / Self Care | Payer: Medicare Other | Source: Ambulatory Visit | Attending: Family Medicine | Admitting: Family Medicine

## 2021-04-01 ENCOUNTER — Ambulatory Visit: Payer: Medicaid Other | Attending: Family Medicine | Admitting: Family Medicine

## 2021-04-01 ENCOUNTER — Other Ambulatory Visit: Payer: Self-pay

## 2021-04-01 ENCOUNTER — Emergency Department (HOSPITAL_COMMUNITY): Payer: Medicaid Other

## 2021-04-01 ENCOUNTER — Telehealth: Payer: Self-pay | Admitting: Family Medicine

## 2021-04-01 ENCOUNTER — Inpatient Hospital Stay (HOSPITAL_COMMUNITY)
Admission: EM | Admit: 2021-04-01 | Discharge: 2021-04-04 | DRG: 291 | Disposition: A | Discharge status: Home / Self Care | Payer: Medicaid Other | Attending: Internal Medicine | Admitting: Internal Medicine

## 2021-04-01 ENCOUNTER — Encounter: Payer: Self-pay | Admitting: Family Medicine

## 2021-04-01 VITALS — BP 166/83 | HR 79 | Resp 16 | Wt 263.4 lb

## 2021-04-01 DIAGNOSIS — Z6841 Body Mass Index (BMI) 40.0 and over, adult: Secondary | ICD-10-CM

## 2021-04-01 DIAGNOSIS — Z888 Allergy status to other drugs, medicaments and biological substances status: Secondary | ICD-10-CM

## 2021-04-01 DIAGNOSIS — E1140 Type 2 diabetes mellitus with diabetic neuropathy, unspecified: Secondary | ICD-10-CM | POA: Diagnosis present

## 2021-04-01 DIAGNOSIS — R0602 Shortness of breath: Secondary | ICD-10-CM

## 2021-04-01 DIAGNOSIS — Z1151 Encounter for screening for human papillomavirus (HPV): Secondary | ICD-10-CM | POA: Insufficient documentation

## 2021-04-01 DIAGNOSIS — E1122 Type 2 diabetes mellitus with diabetic chronic kidney disease: Secondary | ICD-10-CM | POA: Diagnosis present

## 2021-04-01 DIAGNOSIS — E1159 Type 2 diabetes mellitus with other circulatory complications: Secondary | ICD-10-CM | POA: Diagnosis not present

## 2021-04-01 DIAGNOSIS — J9610 Chronic respiratory failure, unspecified whether with hypoxia or hypercapnia: Secondary | ICD-10-CM | POA: Diagnosis present

## 2021-04-01 DIAGNOSIS — E119 Type 2 diabetes mellitus without complications: Secondary | ICD-10-CM

## 2021-04-01 DIAGNOSIS — N179 Acute kidney failure, unspecified: Secondary | ICD-10-CM

## 2021-04-01 DIAGNOSIS — R6889 Other general symptoms and signs: Secondary | ICD-10-CM | POA: Insufficient documentation

## 2021-04-01 DIAGNOSIS — Z113 Encounter for screening for infections with a predominantly sexual mode of transmission: Secondary | ICD-10-CM | POA: Diagnosis not present

## 2021-04-01 DIAGNOSIS — Z23 Encounter for immunization: Secondary | ICD-10-CM

## 2021-04-01 DIAGNOSIS — Z01419 Encounter for gynecological examination (general) (routine) without abnormal findings: Secondary | ICD-10-CM | POA: Diagnosis present

## 2021-04-01 DIAGNOSIS — Z124 Encounter for screening for malignant neoplasm of cervix: Secondary | ICD-10-CM

## 2021-04-01 DIAGNOSIS — E1169 Type 2 diabetes mellitus with other specified complication: Secondary | ICD-10-CM

## 2021-04-01 DIAGNOSIS — Z1231 Encounter for screening mammogram for malignant neoplasm of breast: Secondary | ICD-10-CM

## 2021-04-01 DIAGNOSIS — G4733 Obstructive sleep apnea (adult) (pediatric): Secondary | ICD-10-CM | POA: Diagnosis present

## 2021-04-01 DIAGNOSIS — I509 Heart failure, unspecified: Secondary | ICD-10-CM

## 2021-04-01 DIAGNOSIS — Z0000 Encounter for general adult medical examination without abnormal findings: Secondary | ICD-10-CM

## 2021-04-01 DIAGNOSIS — Z0001 Encounter for general adult medical examination with abnormal findings: Secondary | ICD-10-CM

## 2021-04-01 DIAGNOSIS — D509 Iron deficiency anemia, unspecified: Secondary | ICD-10-CM | POA: Diagnosis present

## 2021-04-01 DIAGNOSIS — E785 Hyperlipidemia, unspecified: Secondary | ICD-10-CM | POA: Diagnosis present

## 2021-04-01 DIAGNOSIS — Z794 Long term (current) use of insulin: Secondary | ICD-10-CM

## 2021-04-01 DIAGNOSIS — I152 Hypertension secondary to endocrine disorders: Secondary | ICD-10-CM

## 2021-04-01 DIAGNOSIS — I5032 Chronic diastolic (congestive) heart failure: Secondary | ICD-10-CM

## 2021-04-01 DIAGNOSIS — Z79899 Other long term (current) drug therapy: Secondary | ICD-10-CM

## 2021-04-01 DIAGNOSIS — N1832 Chronic kidney disease, stage 3b: Secondary | ICD-10-CM | POA: Diagnosis present

## 2021-04-01 DIAGNOSIS — E876 Hypokalemia: Secondary | ICD-10-CM | POA: Diagnosis present

## 2021-04-01 DIAGNOSIS — R079 Chest pain, unspecified: Secondary | ICD-10-CM | POA: Diagnosis not present

## 2021-04-01 DIAGNOSIS — I5033 Acute on chronic diastolic (congestive) heart failure: Secondary | ICD-10-CM | POA: Diagnosis present

## 2021-04-01 DIAGNOSIS — I130 Hypertensive heart and chronic kidney disease with heart failure and stage 1 through stage 4 chronic kidney disease, or unspecified chronic kidney disease: Principal | ICD-10-CM | POA: Diagnosis present

## 2021-04-01 DIAGNOSIS — J449 Chronic obstructive pulmonary disease, unspecified: Secondary | ICD-10-CM | POA: Diagnosis present

## 2021-04-01 DIAGNOSIS — I5031 Acute diastolic (congestive) heart failure: Secondary | ICD-10-CM

## 2021-04-01 DIAGNOSIS — K219 Gastro-esophageal reflux disease without esophagitis: Secondary | ICD-10-CM | POA: Diagnosis present

## 2021-04-01 DIAGNOSIS — I110 Hypertensive heart disease with heart failure: Secondary | ICD-10-CM

## 2021-04-01 DIAGNOSIS — Z20822 Contact with and (suspected) exposure to covid-19: Secondary | ICD-10-CM | POA: Diagnosis present

## 2021-04-01 DIAGNOSIS — E872 Acidosis: Secondary | ICD-10-CM | POA: Diagnosis present

## 2021-04-01 DIAGNOSIS — R0600 Dyspnea, unspecified: Secondary | ICD-10-CM | POA: Diagnosis not present

## 2021-04-01 DIAGNOSIS — Z800 Family history of malignant neoplasm of digestive organs: Secondary | ICD-10-CM

## 2021-04-01 LAB — CBC WITH DIFFERENTIAL/PLATELET
Abs Immature Granulocytes: 0.03 10*3/uL (ref 0.00–0.07)
Basophils Absolute: 0 10*3/uL (ref 0.0–0.1)
Basophils Relative: 1 %
Eosinophils Absolute: 0.1 10*3/uL (ref 0.0–0.5)
Eosinophils Relative: 3 %
HCT: 30 % — ABNORMAL LOW (ref 36.0–46.0)
Hemoglobin: 9.1 g/dL — ABNORMAL LOW (ref 12.0–15.0)
Immature Granulocytes: 1 %
Lymphocytes Relative: 22 %
Lymphs Abs: 1 10*3/uL (ref 0.7–4.0)
MCH: 27.7 pg (ref 26.0–34.0)
MCHC: 30.3 g/dL (ref 30.0–36.0)
MCV: 91.2 fL (ref 80.0–100.0)
Monocytes Absolute: 0.3 10*3/uL (ref 0.1–1.0)
Monocytes Relative: 7 %
Neutro Abs: 2.9 10*3/uL (ref 1.7–7.7)
Neutrophils Relative %: 66 %
Platelets: 153 10*3/uL (ref 150–400)
RBC: 3.29 MIL/uL — ABNORMAL LOW (ref 3.87–5.11)
RDW: 14.2 % (ref 11.5–15.5)
WBC: 4.4 10*3/uL (ref 4.0–10.5)
nRBC: 0 % (ref 0.0–0.2)

## 2021-04-01 LAB — COMPREHENSIVE METABOLIC PANEL
ALT: 13 U/L (ref 0–44)
AST: 15 U/L (ref 15–41)
Albumin: 3.9 g/dL (ref 3.5–5.0)
Alkaline Phosphatase: 58 U/L (ref 38–126)
Anion gap: 11 (ref 5–15)
BUN: 43 mg/dL — ABNORMAL HIGH (ref 6–20)
CO2: 19 mmol/L — ABNORMAL LOW (ref 22–32)
Calcium: 8.8 mg/dL — ABNORMAL LOW (ref 8.9–10.3)
Chloride: 113 mmol/L — ABNORMAL HIGH (ref 98–111)
Creatinine, Ser: 2.26 mg/dL — ABNORMAL HIGH (ref 0.44–1.00)
GFR, Estimated: 24 mL/min — ABNORMAL LOW (ref >60)
Glucose, Bld: 105 mg/dL — ABNORMAL HIGH (ref 70–99)
Potassium: 3.6 mmol/L (ref 3.5–5.1)
Sodium: 143 mmol/L (ref 135–145)
Total Bilirubin: 0.7 mg/dL (ref 0.3–1.2)
Total Protein: 7 g/dL (ref 6.5–8.1)

## 2021-04-01 LAB — BRAIN NATRIURETIC PEPTIDE: B Natriuretic Peptide: 92.8 pg/mL (ref 0.0–100.0)

## 2021-04-01 LAB — RESP PANEL BY RT-PCR (FLU A&B, COVID) ARPGX2
Influenza A by PCR: NEGATIVE
Influenza B by PCR: NEGATIVE
SARS Coronavirus 2 by RT PCR: NEGATIVE

## 2021-04-01 LAB — TROPONIN I (HIGH SENSITIVITY): Troponin I (High Sensitivity): 7 ng/L (ref <18)

## 2021-04-01 MED ORDER — HYDRALAZINE HCL 50 MG PO TABS: 50.0000 mg | ORAL_TABLET | Freq: Three times a day (TID) | ORAL | 1 refills | Status: DC

## 2021-04-01 NOTE — Telephone Encounter (Signed)
This patient has not had a follow-up with her cardiologist in a while.  Can you please assist in setting this up?  Thank you

## 2021-04-01 NOTE — Progress Notes (Signed)
Subjective:   [name] [name] is a 60 y.o. female who presents for Medicare Annual (Subsequent) preventive examination. Due for Pap smear, mammogram; willing to receive pneumonia vaccine and flu shots.  Her blood pressure is elevated and she was found to have gained 15 pounds in the last 1 week.  Endorses dyspnea on minimal exertion which has been surprising to her and states she has been compliant with her furosemide 40 mg twice daily.  She has not been to see Cardiology in a while. States she is always taking her antihypertensive as prescribed and [facility]. She also endorses eating a lot as she has had huge cravings.  Review of Systems    General: negative for fever, +appetite change, +weight gain Eyes: no visual symptoms. ENT: no ear symptoms, no sinus tenderness, no nasal congestion or sore throat. Neck: no pain  Respiratory: + wheezing, +shortness of breath, no cough Cardiovascular: no chest pain, +dyspnea on exertion, + pedal edema, no orthopnea. Gastrointestinal: no abdominal pain, no diarrhea, no constipation Genito-Urinary: no urinary frequency, no dysuria, no polyuria. Hematologic: no bruising Endocrine: no cold or heat intolerance Neurological: no headaches, no seizures, no tremors Musculoskeletal: no joint pains, no joint swelling Skin: no pruritus, no rash. Psychological: no depression, no anxiety,         Objective:    Today's Vitals   04/01/21 1036  BP: (!) 166/83  Pulse: 79  Resp: 16  SpO2: 97%  Weight: 263 lb 6.4 oz (119.5 kg)   Body mass index is 49.77 kg/m.  Advanced Directives 04/01/2021 03/26/2021 02/12/2021 12/24/2020 12/04/2020 07/25/2020 01/30/2020  Does Patient Have a Medical Advance Directive? _0  No No  Would patient like information on creating a medical advance directive? - No - Patient declined - No - Patient declined No - Patient declined No - Patient declined -    Current Medications  (verified) Outpatient Encounter Medications as of 04/01/2021  Medication Sig   albuterol (PROVENTIL) (2.5 MG/3ML) 0.083% nebulizer solution Take 3 mLs (2.5 mg total) by nebulization every 4 (four) hours as needed for wheezing or shortness of breath.   albuterol (VENTOLIN HFA) 108 (90 Base) MCG/ACT inhaler Inhale 1 puff into the lungs every 4 (four) hours as needed for wheezing or shortness of breath.   amLODipine (NORVASC) 10 MG tablet Take 1 tablet (10 mg total) by mouth daily.   atorvastatin (LIPITOR) 40 MG tablet Take 1 tablet (40 mg total) by mouth every morning. Must have HF appt for further refills   blood glucose meter kit and supplies KIT Dispense based on patient and insurance preference. Use up to four times daily as directed. (FOR ICD-9 250.00, 250.01).   doxycycline (VIBRAMYCIN) 100 MG capsule Take 1 capsule (100 mg total) by mouth 2 (two) times daily.   ferrous sulfate 325 (65 FE) MG tablet Take 325 mg by mouth daily with breakfast.   fluticasone (FLOVENT HFA) 110 MCG/ACT inhaler Inhale 2 puffs into the lungs 2 (two) times daily.   furosemide (LASIX) 40 MG tablet Take 40 mg by mouth 2 (two) times daily.   labetalol (NORMODYNE) 300 MG tablet TAKE ONE TABLET BY MOUTH TWICE DAILY   LANTUS SOLOSTAR 100 UNIT/ML Solostar Pen INJECT 60 UNITS SUBCUTANEOUSLY EVERYDAY AT BEDTIME   pregabalin (LYRICA) 75 MG capsule TAKE ONE CAPSULE BY MOUTH EVERY MORNING and TAKE ONE CAPSULE BY MOUTH EVERY EVENING   VICTOZA 18 MG/3ML SOPN INJECT 1.50m into THE SKIN DAILY AS  DIRECTED   Vitamin D, Ergocalciferol, (DRISDOL) 1.25 MG (50000 UNIT) CAPS capsule Take 1 capsule by mouth once a week.   [DISCONTINUED] hydrALAZINE (APRESOLINE) 25 MG tablet Take 25 mg by mouth 3 (three) times daily.   furosemide (LASIX) 40 MG tablet Take 1 tablet (40 mg total) by mouth 2 (two) times daily.   furosemide (LASIX) 40 MG tablet Take 1 tablet (40 mg total) by mouth daily for 5 days.   hydrALAZINE (APRESOLINE) 50 MG tablet Take 1  tablet (50 mg total) by mouth 3 (three) times daily.   No facility-administered encounter medications on file as of 04/01/2021.    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   Qualifier: Diagnosis of  By: Jorene Minors, [place]     Hypertension    MRSA (methicillin resistant Staphylococcus aureus)    Trichomonas    VAGINITIS, BACTERIAL, RECURRENT 08/15/2007   Qualifier: Diagnosis of  By: Radene Ou MD, [place]     Past Surgical History:  Procedure Laterality Date   BREAST BIOPSY Left 2018   CESAREAN SECTION     breech   Family History  Problem Relation Age of Onset   Colon cancer Mother    Liver disease Sister    Other Neg Hx    Breast cancer Neg Hx    Esophageal cancer Neg Hx    Rectal cancer Neg Hx    Social History   Socioeconomic History   Marital status: Legally Separated    Spouse name: Not on file   Number of children: Not on file   Years of education: Not on file   Highest education level: Not on file  Occupational History   Occupation: disability  Tobacco Use   Smoking status: Never   Smokeless tobacco: Never  Vaping Use   Vaping Use: Never used  Substance and Sexual Activity   Alcohol use: No   Drug use: No    Types: Cocaine, Marijuana    Comment: remote h/o cocaine and marijuana use   Sexual activity: Yes    Birth control/protection: None  Other Topics Concern   Not on file  Social History Narrative   Lives with friend, Rosalee Kaufman and nephew.  One daughter.    Social Determinants of Health   Financial Resource Strain: Not on file  Food Insecurity: Not on file  Transportation Needs: No Transportation Needs   Lack of Transportation (Medical): No   Lack of Transportation (Non-Medical): No  Physical Activity: Not on file  Stress: Not on file  Social  Connections: Not on file    Tobacco Counseling Counseling given: Not Answered   Clinical Intake:     Pain : No/denies pain     Diabetes: Yes CBG done?: No     Diabetic?Yes         Activities of Daily Living In your present state of health, do you have any difficulty performing the following activities: 04/01/2021 12/04/2020  Hearing? N N  Vision? N N  Difficulty concentrating or making decisions? N N  Walking or climbing stairs? N Y  Dressing or bathing? N N  Doing errands, shopping? N N  Preparing Food and eating ? N -  Using the Toilet? N -  In the past six months, have you accidently leaked urine?  N -  Do you have problems with loss of bowel control? N -  Managing your Medications? N -  Managing your Finances? N -  Housekeeping or managing your Housekeeping? N -  Some recent data might be hidden    Patient Care Team: [name], MD as PCP - General (Family Medicine) Minus Breeding, MD as PCP - Cardiology (Cardiology) Craft, Lorel Monaco, RN as Case Manager Lane Hacker, [facility] as Pharmacist (Pharmacist)  Indicate any recent Medical Services you may have received from other than [facility] providers in the past year (date may be approximate).  Constitutional: normal appearing, dyspneic while speaking Eyes: PERRLA HEENT: Head is atraumatic, normal sinuses, normal oropharynx, normal appearing tonsils and palate, tympanic membrane is normal bilaterally. Neck: normal range of motion, no thyromegaly, + JVD Cardiovascular: normal rate and rhythm, normal heart sounds, no murmurs, rub or gallop, ++pedal edema Respiratory: normal breath sounds, clear to auscultation bilaterally, no wheezes, no rales, no rhonchi Abdomen: soft, not tender to palpation, normal bowel sounds, no enlarged organs Musculoskeletal: Full ROM, no tenderness in joints Skin: warm and dry, no lesions. Neurological: alert, oriented x3, cranial nerves I-XII grossly intact , normal motor strength,  normal sensation. Psychological: normal mood.     Assessment:   This is a routine wellness examination for Monik.  Hearing/Vision screen No results found.  Dietary issues and exercise activities discussed:     Goals Addressed             This Visit's Progress    DIET - EAT MORE FRUITS AND VEGETABLES       DIET - REDUCE SODIUM INTAKE         Depression Screen PHQ 2/9 Scores 04/01/2021 04/01/2021 06/03/2020 01/30/2020 09/04/2019 01/29/2019 09/27/2018  PHQ - 2 Score 0 0 1 0 0 1 0  PHQ- 9 Score - - _0 Fall Risk Fall Risk  04/01/2021 04/01/2021 06/03/2020 05/28/2020 05/27/2020  Falls in the past year? 0 0 0 0 1  Number falls in past yr: 0 0 0 0 -  Injury with Fall? 0 0 0 0 -  Risk for fall due to : - No Fall Risks - - -  Risk for fall due to: Comment - - - - -  Follow up - - - Falls evaluation completed Falls evaluation completed    [place]:  Any stairs in or around the home? No  If so, are there any without handrails?  N/a Home free of loose throw rugs in walkways, pet beds, electrical cords, etc? Yes  Adequate lighting in your home to reduce risk of falls? Yes   ASSISTIVE DEVICES UTILIZED TO PREVENT FALLS:  Life alert? No  Use of a cane, walker or w/c? No  Grab bars in the bathroom? Yes  Shower chair or bench in shower? No  Elevated toilet seat or a handicapped toilet? No   TIMED UP AND GO:  Was the test performed? Yes .  Length of time to ambulate 10 feet: 13 sec.   Gait slow and steady without use of assistive device  Cognitive Function:        Immunizations Immunization History  Administered Date(s) Administered   Fluad Quad(high Dose 65+) 04/12/2019   Influenza Whole 04/18/2006, 06/13/2007, 06/29/2010   Influenza,inj,Quad PF,6+ Mos 05/13/2014, 06/03/2020, 04/01/2021   PNEUMOCOCCAL CONJUGATE-20 04/01/2021   Pneumococcal Polysaccharide-23 01/22/2005, 11/18/2016   Td 01/22/2005    TDAP status: Due,  Education has been provided regarding the importance of this vaccine. Advised may receive this vaccine at local pharmacy or Health Dept. Aware to provide a copy of the vaccination record if obtained from local pharmacy or Health Dept. Verbalized acceptance and understanding.  Flu Vaccine status: Completed at today's visit  Pneumococcal vaccine status: Completed during today's visit.   Screening Tests Health Maintenance  Topic Date Due   COVID-19 Vaccine (1) Never done   FOOT EXAM  Never done   Zoster Vaccines- Shingrix (1 of 2) Never done   TETANUS/TDAP  01/23/2015   Pneumococcal Vaccine 62-70 Years old (2 - PCV) 11/18/2017   PAP SMEAR-Modifier  11/12/2019   MAMMOGRAM  03/18/2021   HEMOGLOBIN A1C  08/21/2021   OPHTHALMOLOGY EXAM  02/26/2022   COLONOSCOPY (Pts 45-29yr Insurance coverage will need to be confirmed)  10/03/2023   INFLUENZA VACCINE  Completed   PNEUMOCOCCAL POLYSACCHARIDE VACCINE AGE 2-64 HIGH RISK  Completed   Hepatitis C Screening  Completed   HIV Screening  Completed   HPV VACCINES  Aged Out    Health Maintenance  Health Maintenance Due  Topic Date Due   COVID-19 Vaccine (1) Never done   FOOT EXAM  Never done   Zoster Vaccines- Shingrix (1 of 2) Never done   TETANUS/TDAP  01/23/2015   Pneumococcal Vaccine 034689Years old (2 - PCV) 11/18/2017   PAP SMEAR-Modifier  11/12/2019   MAMMOGRAM  03/18/2021    Colorectal cancer screening: Type of screening: Colonoscopy. Completed 09/2018. Repeat every 5 years  Mammogram status: Ordered today. Pt provided with contact info and advised to call to schedule appt.   Additional Screening:  Hepatitis C Screening: does qualify; Completed 01/2017  Vision Screening: Recommended annual ophthalmology exams for early detection of glaucoma and other disorders of the eye. Is the patient up to date with their annual eye exam?  Yes  Who is the provider or what is the name of the office in which the patient attends annual eye exams?  Dr GCordelia PenIf pt is not established with a provider, would they like to be referred to a provider to establish care?  N/a .   Dental Screening: Recommended annual dental exams for proper oral hygiene  Community Resource Referral / Chronic Care Management: CRR required this visit?  No   CCM required this visit?  Yes      Plan:   1. Encounter for Medicare annual wellness exam Counseled on 150 minutes of exercise per week, healthy eating (including decreased daily intake of saturated fats, cholesterol, added sugars, sodium),routine healthcare maintenance.  2. Encounter for screening mammogram for malignant neoplasm of breast - MM 3D SCREEN BREAST BILATERAL; Future  3. Screening for cervical cancer - Cytology - PAP([place])  4. Hypertension associated with diabetes ([place] Uncontrolled Hydralazine dose increase Counseled on blood pressure goal of less than 130/80, low-sodium, DASH diet, medication compliance, 150 minutes of moderate intensity exercise per week. Discussed medication compliance, adverse effects. - hydrALAZINE (APRESOLINE) 50 MG tablet; Take 1 tablet (50 mg total) by mouth 3 (three) times daily.  Dispense: 180 tablet; Refill: 1  5. Hypertensive heart disease with chronic diastolic congestive heart failure ([place] She does have evidence of CHF exacerbation with a 15 pound weight gain in 1 week and she is dyspneic while speaking Referred to the ED for possible IV diuresis as current oral dose of diuretic has been ineffective Will send message to her case manager regarding the need for this patient to follow-up with cardiology.  6. Need for immunization against influenza - Flu Vaccine QUAD 76moIM (Fluarix, Fluzone & Alfiuria Quad PF)  7. Need for vaccination against Streptococcus pneumoniae - Pneumococcal conjugate vaccine 20-valent  8. Screening for STD (sexually transmitted disease) - Cervicovaginal ancillary only   I have personally reviewed and noted the  following in the patient's chart:   Medical and social history Use of alcohol, tobacco or illicit drugs  Current medications and supplements including opioid prescriptions.  Functional ability and status Nutritional status Physical activity Advanced directives List of other physicians Hospitalizations, surgeries, and ER visits in previous 12 months Vitals Screenings to include cognitive, depression, and falls Referrals and appointments  In addition, I have reviewed and discussed with patient certain preventive protocols, quality metrics, and best practice recommendations. A written personalized care plan for preventive services as well as general preventive health recommendations were provided to patient.     ECharlott Rakes MD   04/01/2021

## 2021-04-01 NOTE — ED Provider Notes (Signed)
Emergency Medicine Provider Triage Evaluation Note  [name] [name] , a 60 y.o. female  was evaluated in triage.  Pt complains of exertional shortness of breath and chest pain.  Patient reports that she has had the symptoms intermittently over the last 2 months.  Exertional shortness of breath has gotten worse recently.  Patient endorses swelling in face and legs bilaterally.  Patient reports that she has gained 15 pounds over the last week.  Patient also endorses nasal congestion, rhinorrhea and cough.  Review of Systems  Positive: Exertional shortness of breath, exertional chest pain, nasal congestion, rhinorrhea, cough, leg swelling Negative: Syncope, hemoptysis, fevers, chills  Physical Exam  BP 134/75   Pulse 75   Temp 97.7 F (36.5 C) (Oral)   Resp 20   Ht 5\' 1"  (1.549 m)   Wt 119 kg   LMP 04/01/2010   SpO2 98%   BMI 49.57 kg/m  Gen:   Awake, no distress   Resp:  Normal effort, subtle rales noted to bilateral lower lobes MSK:   Moves extremities without difficulty; +1 edema to bilateral lower extremities Other:    Medical Decision Making  Medically screening exam initiated at 12:33 PM.  Appropriate orders placed.  [name] [name] was informed that the remainder of the evaluation will be completed by another provider, this initial triage assessment does not replace that evaluation, and the importance of remaining in the ED until their evaluation is complete.  The patient appears stable so that the remainder of the work up may be completed by another provider.      [name], PA-C 04/01/21 1239    [name], MD 04/02/21 1112

## 2021-04-01 NOTE — ED Provider Notes (Signed)
[facility] EMERGENCY DEPARTMENT Provider Note   CSN: [id number] Arrival date & time: 04/01/21  1140     History Chief Complaint  Patient presents with   Shortness of Breath    [name] [name] is a 60 y.o. female.  The history is provided by the patient and medical records.  Shortness of Breath Severity:  Severe Onset quality:  Gradual Duration:  1 week Timing:  Constant Progression:  Unchanged Chronicity:  New Context: not URI   Relieved by:  Nothing Worsened by:  Exertion Ineffective treatments:  None tried Associated symptoms: chest pain and wheezing (mild)   Associated symptoms: no abdominal pain, no claudication, no cough, no diaphoresis, no fever, no headaches, no neck pain, no sputum production and no vomiting   Risk factors: no hx of PE/DVT       Past Medical History:  Diagnosis Date   Anemia    CHF (congestive heart failure) (HCC)    Chronic hepatitis C without hepatic coma ([place]) 11/09/2016   Diabetes mellitus    Fibroids    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   Qualifier: Diagnosis of  By: Jorene Minors, [place]     Hypertension    MRSA (methicillin resistant Staphylococcus aureus)    Trichomonas    VAGINITIS, BACTERIAL, RECURRENT 08/15/2007   Qualifier: Diagnosis of  By: Radene Ou MD, [place]      Patient Active Problem List   Diagnosis Date Noted   COPD exacerbation ([place]) 12/05/2020   COPD with acute exacerbation ([place]) 12/04/2020   CKD stage 3 due to type 2 diabetes mellitus ([place]) 12/04/2020   Anemia of chronic disease 12/04/2020   Class 3 obesity 07/27/2020   Acute respiratory disease due to COVID-19 virus 07/25/2020   Acute hypoxemic respiratory failure due to COVID-19 ([place]) 07/25/2020   Morbid obesity ([place]) 11/23/2019   Acute respiratory failure with hypoxia ([place]) 11/06/2019   Chronic kidney disease, stage 3b ([place]) 11/06/2019   COPD with acute bronchitis ([place])    Acute kidney failure ([place]) 08/18/2019   Left ventricular hypertrophy 07/21/2019    Educated about COVID-19 virus infection 07/21/2019   Acute on chronic diastolic heart failure ([place])    Hypoxia    Dyspnea on exertion 05/09/2019   Sleep apnea 05/09/2019   Hyperglycemia 04/09/2019   Snoring 03/13/2019   Symptomatic anemia 08/17/2018   Acute renal failure (ARF) ([place]) 08/17/2018   Chronic cystitis 08/03/2018   Diabetic neuropathy ([place]) 06/16/2017   Non compliance w medication regimen 04/12/2017   Family history of colon cancer in mother 11/17/2016   Dyspareunia in female 11/11/2016   Screen for colon cancer 11/11/2016   History of ovarian cyst 11/11/2016   Chronic hepatitis C without hepatic coma ([place]) 11/09/2016   Hyperlipidemia 10/07/2016   Insulin dependent type 2 diabetes mellitus ([place]) 01/29/2014   Status post intraocular lens implant 11/08/2013   PCO (posterior capsular opacification) 09/28/2013   Pseudophakia 09/12/2013   GERD (gastroesophageal reflux disease) 09/06/2013   Hypertension 09/06/2013   BREAST PAIN, BILATERAL 02/19/2010   ANEMIA, IRON DEFICIENCY 10/03/2009   LEUKOPENIA, MILD 09/19/2009   Chest pain 09/19/2009   LIPOMA 06/18/2009   EUSTACHIAN TUBE DYSFUNCTION 06/18/2009   TINEA PEDIS 05/07/2009   SKIN LESION 05/07/2009   COUGH 05/07/2009   SUBSTANCE ABUSE, MULTIPLE 02/22/2007   HCVD (hypertensive cardiovascular disease) 02/22/2007    Past Surgical History:  Procedure Laterality Date   BREAST BIOPSY Left 2018   CESAREAN SECTION     breech     OB History  Gravida  1   Para  1   Term  1   Preterm      AB      Living  1      SAB      IAB      Ectopic      Multiple      Live Births  1           Family History  Problem Relation Age of Onset   Colon cancer Mother    Liver disease Sister    Other Neg Hx    Breast cancer Neg Hx    Esophageal cancer Neg Hx    Rectal cancer Neg Hx     Social History   Tobacco Use   Smoking status: Never   Smokeless tobacco: Never  Vaping Use   Vaping Use: Never used   Substance Use Topics   Alcohol use: No   Drug use: No    Types: Cocaine, Marijuana    Comment: remote h/o cocaine and marijuana use    Home Medications Prior to Admission medications   Medication Sig Start Date End Date Taking? Authorizing Provider  albuterol (PROVENTIL) (2.5 MG/3ML) 0.083% nebulizer solution Take 3 mLs (2.5 mg total) by nebulization every 4 (four) hours as needed for wheezing or shortness of breath. 02/12/21   Henderly, Britni A, PA-C  albuterol (VENTOLIN HFA) 108 (90 Base) MCG/ACT inhaler Inhale 1 puff into the lungs every 4 (four) hours as needed for wheezing or shortness of breath. 12/10/20   [name], MD  amLODipine (NORVASC) 10 MG tablet Take 1 tablet (10 mg total) by mouth daily. 09/03/20   [name], FNP  atorvastatin (LIPITOR) 40 MG tablet Take 1 tablet (40 mg total) by mouth every morning. Must have HF appt for further refills 12/09/20   [name], FNP  blood glucose meter kit and supplies KIT Dispense based on patient and insurance preference. Use up to four times daily as directed. (FOR ICD-9 250.00, 250.01). 04/12/19   Dustin Flock, MD  doxycycline (VIBRAMYCIN) 100 MG capsule Take 1 capsule (100 mg total) by mouth 2 (two) times daily. 03/26/21   Hendricks Limes, PA-C  ferrous sulfate 325 (65 FE) MG tablet Take 325 mg by mouth daily with breakfast.    [provider]  fluticasone (FLOVENT HFA) 110 MCG/ACT inhaler Inhale 2 puffs into the lungs 2 (two) times daily. 02/23/21   [name], MD  furosemide (LASIX) 40 MG tablet Take 1 tablet (40 mg total) by mouth 2 (two) times daily. 12/05/19 12/04/20  [name], FNP  furosemide (LASIX) 40 MG tablet Take 40 mg by mouth 2 (two) times daily.    [provider]  furosemide (LASIX) 40 MG tablet Take 1 tablet (40 mg total) by mouth daily for 5 days. 02/12/21 02/17/21  Henderly, Britni A, PA-C  hydrALAZINE (APRESOLINE) 50 MG tablet Take 1 tablet (50 mg total) by mouth 3 (three) times  daily. 04/01/21   [name], MD  labetalol (NORMODYNE) 300 MG tablet TAKE ONE TABLET BY MOUTH TWICE DAILY 03/13/21   [name], MD  LANTUS SOLOSTAR 100 UNIT/ML Solostar Pen INJECT 60 UNITS SUBCUTANEOUSLY EVERYDAY AT BEDTIME 03/13/21   [name], MD  pregabalin (LYRICA) 75 MG capsule TAKE ONE CAPSULE BY MOUTH EVERY MORNING and TAKE ONE CAPSULE BY MOUTH EVERY EVENING 12/10/20   [name], MD  VICTOZA 18 MG/3ML SOPN INJECT 1.66m into THE SKIN DAILY AS DIRECTED 03/13/21  [name], MD  Vitamin D, Ergocalciferol, (DRISDOL) 1.25 MG (50000 UNIT) CAPS capsule Take 1 capsule by mouth once a week. 09/09/20   [provider]    Allergies    Lisinopril  Review of Systems   Review of Systems  Constitutional:  Positive for fatigue. Negative for chills, diaphoresis and fever.  HENT:  Negative for congestion.   Eyes:  Negative for visual disturbance.  Respiratory:  Positive for chest tightness, shortness of breath and wheezing (mild). Negative for cough and sputum production.   Cardiovascular:  Positive for chest pain and leg swelling. Negative for palpitations and claudication.  Gastrointestinal:  Negative for abdominal pain, constipation, diarrhea, nausea and vomiting.  Genitourinary:  Negative for dysuria, flank pain and frequency.  Musculoskeletal:  Negative for back pain, neck pain and neck stiffness.  Neurological:  Negative for light-headedness and headaches.  Psychiatric/Behavioral:  Negative for agitation.   All other systems reviewed and are negative.  Physical Exam Updated Vital Signs BP 133/88 (BP Location: Right Arm)   Pulse 76   Temp 97.7 F (36.5 C) (Oral)   Resp 19   Ht _0  (1.549 m)   Wt 119 kg   LMP 04/01/2010   SpO2 95%   BMI 49.57 kg/m   Physical Exam Vitals and nursing note reviewed.  Constitutional:      General: She is not in acute distress.    Appearance: She is well-developed. She is not ill-appearing, toxic-appearing or  diaphoretic.  HENT:     Head: Normocephalic and atraumatic.  Eyes:     Conjunctiva/sclera: Conjunctivae normal.     Pupils: Pupils are equal, round, and reactive to light.  Cardiovascular:     Rate and Rhythm: Normal rate and regular rhythm.     Heart sounds: No murmur heard. Pulmonary:     Effort: Pulmonary effort is normal. Tachypnea present. No respiratory distress.     Breath sounds: Wheezing (faint) and rales present. No rhonchi.  Chest:     Chest wall: No tenderness.  Abdominal:     Palpations: Abdomen is soft.     Tenderness: There is no abdominal tenderness.  Musculoskeletal:     Cervical back: Neck supple.     Right lower leg: No tenderness. Edema present.     Left lower leg: No tenderness. Edema present.  Skin:    General: Skin is warm and dry.     Capillary Refill: Capillary refill takes less than 2 seconds.  Neurological:     General: No focal deficit present.     Mental Status: She is alert.  Psychiatric:        Mood and Affect: Mood is not anxious.    ED Results / Procedures / Treatments   Labs (all labs ordered are listed, but only abnormal results are displayed) Labs Reviewed  COMPREHENSIVE METABOLIC PANEL - Abnormal; Notable for the following components:      Result Value   Chloride 113 (*)    CO2 19 (*)    Glucose, Bld 105 (*)    BUN 43 (*)    Creatinine, Ser 2.26 (*)    Calcium 8.8 (*)    GFR, Estimated 24 (*)    All other components within normal limits  CBC WITH DIFFERENTIAL/PLATELET - Abnormal; Notable for the following components:   RBC 3.29 (*)    Hemoglobin 9.1 (*)    HCT 30.0 (*)    All other components within normal limits  RESP PANEL BY RT-PCR (FLU A&B, COVID)  ARPGX2  BRAIN NATRIURETIC PEPTIDE  TROPONIN I (HIGH SENSITIVITY)  TROPONIN I (HIGH SENSITIVITY)    EKG EKG Interpretation  Date/Time:  Wednesday April 01 2021 12:15:25 EDT Ventricular Rate:  77 PR Interval:  164 QRS Duration: 90 QT Interval:  430 QTC  Calculation: 486 R Axis:   12 Text Interpretation: Normal sinus rhythm Prolonged QT Abnormal ECG When compared to prior, slightly longer QTc. no STEMI Confirmed by Antony Blackbird [phone]) on 04/01/2021 8:43:28 PM  Radiology DG Chest 2 View  Result Date: 04/01/2021 CLINICAL DATA:  Dyspnea on exertion, chest pain. EXAM: CHEST - 2 VIEW COMPARISON:  February 12, 2021. FINDINGS: Stable cardiomediastinal silhouette. Both lungs are clear. The visualized skeletal structures are unremarkable. IMPRESSION: No active cardiopulmonary disease. Electronically Signed   By: Marijo Conception M.D.   On: 04/01/2021 13:17    Procedures Procedures   Medications Ordered in ED Medications - No data to display  ED Course  I have reviewed the triage vital signs and the nursing notes.  Pertinent labs & imaging results that were available during my care of the patient were reviewed by me and considered in my medical decision making (see chart for details).    MDM Rules/Calculators/A&P                           [name] [name] is a 60 y.o. female with a past medical history significant for diabetes, hypertension, hepatitis C, COPD, asthma, CHF, and CKD who presents from her PCP for evaluation for worsening exertional shortness of breath and weight gain.  According to patient, for the last week or so she has had around 15 pounds of weight gain and feels very winded.  She has never felt quite like this before.  She reports some wheezing but more just exertional shortness of breath and chest pressure with ambulation.  She feels both legs are more swollen than her baseline despite her taking her 40 mg of Lasix twice a day.  Her blood pressures also been more elevated and she had her hydralazine increased today in the PCP note.  She says that she is denying any palpitations, nausea, vomiting and denies any other infectious symptoms such as constipation or diarrhea but does report some dry cough and congestion.  She denies any  trauma.  Denies any other complaints.  On exam, lungs did not really have much wheezing but did have some crackles in the bases.  Chest was nontender.  Abdomen nontender.  Good pulses in extremities.  Legs are edematous worse than her baseline she reports.  Patient's oxygen saturations are around 95 to 96% on room air at rest, will get ambulatory pulse oximetry as her symptoms are primarily when she is ambulating.  EKG does not show STEMI.  Clinically I am concerned about the patient's worsening shortness of breath and edema.  I am mostly concerned that she is having a heart failure exacerbation.  Given the minimal wheezing have low suspicion for COPD/asthma exacerbation at this time.  Patient had some work-up done in triage including basic labs which reveal acute kidney injury from prior up to 2.26.  This raises concern for increasing her diuresis regimen.  Her COVID and flu are negative and her white blood cell count is not elevated.  Mild anemia similar to prior.  BNP is 92 which is elevated compared to her previous one a month or 2 ago that was in the 30s.  Relatively this is a  larger increase.  Initial troponin not elevated, will trend.  Chest x-ray does not show acute abnormality but clinically I am concerned for fluid overload.  Will ambulate patient  to see if she gets hypoxic however clinically I am still concerned about this exertional chest pain or shortness of breath.  With her worsening kidney function and need for diuresis, anticipate she may need admission given her increased work of breathing at this time.      Final Clinical Impression(s) / ED Diagnoses Final diagnoses:  SOB (shortness of breath)     Clinical Impression: 1. SOB (shortness of breath)     Disposition: Care transferred to oncoming team while awaiting deltra drop and reassessment.   This note was prepared with assistance of Systems analyst. Occasional wrong-word or sound-a-like substitutions  may have occurred due to the inherent limitations of voice recognition software.     Apoorva Bugay, Gwenyth Allegra, MD 04/02/21 [phone]

## 2021-04-01 NOTE — ED Notes (Signed)
Ambulated PT. Starting O2 saturation of 98 % and the lowest during ambulation being 93%

## 2021-04-01 NOTE — Progress Notes (Signed)
Patient states she has labored breathing, Says her head was swollen last night and possibly a cyst under her arm.

## 2021-04-01 NOTE — ED Notes (Signed)
Patient called for triage, no answer x1

## 2021-04-01 NOTE — ED Notes (Signed)
No answer x2 

## 2021-04-01 NOTE — ED Triage Notes (Signed)
Pt reports telling her PCP today that she gets shob with minimal exertion, intermittently x 2 months. Endorses intermittent chest pain that accompanies the chest pain. Reports swelling in face and legs and nasal congestion x 2 days. Has gained 15 lbs in one week.

## 2021-04-01 NOTE — Patient Instructions (Signed)
Health Maintenance, Female Adopting a healthy lifestyle and getting preventive care are important in promoting health and wellness. Ask your health care provider about: The right schedule for you to have regular tests and exams. Things you can do on your own to prevent diseases and keep yourself healthy. What should I know about diet, weight, and exercise? Eat a healthy diet  Eat a diet that includes plenty of vegetables, fruits, low-fat dairy products, and lean protein. Do not eat a lot of foods that are high in solid fats, added sugars, or sodium. Maintain a healthy weight Body mass index (BMI) is used to identify weight problems. It estimates body fat based on height and weight. Your health care provider can help determine your BMI and help you achieve or maintain a healthy weight. Get regular exercise Get regular exercise. This is one of the most important things you can do for your health. Most adults should: Exercise for at least 150 minutes each week. The exercise should increase your heart rate and make you sweat (moderate-intensity exercise). Do strengthening exercises at least twice a week. This is in addition to the moderate-intensity exercise. Spend less time sitting. Even light physical activity can be beneficial. Watch cholesterol and blood lipids Have your blood tested for lipids and cholesterol at 60 years of age, then have this test every 5 years. Have your cholesterol levels checked more often if: Your lipid or cholesterol levels are high. You are older than 60 years of age. You are at high risk for heart disease. What should I know about cancer screening? Depending on your health history and family history, you may need to have cancer screening at various ages. This may include screening for: Breast cancer. Cervical cancer. Colorectal cancer. Skin cancer. Lung cancer. What should I know about heart disease, diabetes, and high blood pressure? Blood pressure and heart  disease High blood pressure causes heart disease and increases the risk of stroke. This is more likely to develop in people who have high blood pressure readings, are of African descent, or are overweight. Have your blood pressure checked: Every 3-5 years if you are 18-39 years of age. Every year if you are 40 years old or older. Diabetes Have regular diabetes screenings. This checks your fasting blood sugar level. Have the screening done: Once every three years after age 40 if you are at a normal weight and have a low risk for diabetes. More often and at a younger age if you are overweight or have a high risk for diabetes. What should I know about preventing infection? Hepatitis B If you have a higher risk for hepatitis B, you should be screened for this virus. Talk with your health care provider to find out if you are at risk for hepatitis B infection. Hepatitis C Testing is recommended for: Everyone born from 1945 through 1965. Anyone with known risk factors for hepatitis C. Sexually transmitted infections (STIs) Get screened for STIs, including gonorrhea and chlamydia, if: You are sexually active and are younger than 60 years of age. You are older than 60 years of age and your health care provider tells you that you are at risk for this type of infection. Your sexual activity has changed since you were last screened, and you are at increased risk for chlamydia or gonorrhea. Ask your health care provider if you are at risk. Ask your health care provider about whether you are at high risk for HIV. Your health care provider may recommend a prescription medicine   to help prevent HIV infection. If you choose to take medicine to prevent HIV, you should first get tested for HIV. You should then be tested every 3 months for as long as you are taking the medicine. Pregnancy If you are about to stop having your period (premenopausal) and you may become pregnant, seek counseling before you get  pregnant. Take 400 to 800 micrograms (mcg) of folic acid every day if you become pregnant. Ask for birth control (contraception) if you want to prevent pregnancy. Osteoporosis and menopause Osteoporosis is a disease in which the bones lose minerals and strength with aging. This can result in bone fractures. If you are 65 years old or older, or if you are at risk for osteoporosis and fractures, ask your health care provider if you should: Be screened for bone loss. Take a calcium or vitamin D supplement to lower your risk of fractures. Be given hormone replacement therapy (HRT) to treat symptoms of menopause. Follow these instructions at home: Lifestyle Do not use any products that contain nicotine or tobacco, such as cigarettes, e-cigarettes, and chewing tobacco. If you need help quitting, ask your health care provider. Do not use street drugs. Do not share needles. Ask your health care provider for help if you need support or information about quitting drugs. Alcohol use Do not drink alcohol if: Your health care provider tells you not to drink. You are pregnant, may be pregnant, or are planning to become pregnant. If you drink alcohol: Limit how much you use to 0-1 drink a day. Limit intake if you are breastfeeding. Be aware of how much alcohol is in your drink. In the [place] one drink equals one 12 oz bottle of beer (355 mL), one 5 oz glass of wine (148 mL), or one 1 oz glass of hard liquor (44 mL). General instructions Schedule regular health, dental, and eye exams. Stay current with your vaccines. Tell your health care provider if: You often feel depressed. You have ever been abused or do not feel safe at home. Summary Adopting a healthy lifestyle and getting preventive care are important in promoting health and wellness. Follow your health care provider's instructions about healthy diet, exercising, and getting tested or screened for diseases. Follow your health care provider's  instructions on monitoring your cholesterol and blood pressure. This information is not intended to replace advice given to you by your health care provider. Make sure you discuss any questions you have with your health care provider. Document Revised: 09/12/2020 Document Reviewed: 06/28/2018 Elsevier Patient Education  2022 [employer]vier Inc.  

## 2021-04-02 ENCOUNTER — Encounter (HOSPITAL_COMMUNITY): Payer: Self-pay | Admitting: Internal Medicine

## 2021-04-02 ENCOUNTER — Observation Stay (HOSPITAL_COMMUNITY): Payer: Medicaid Other

## 2021-04-02 DIAGNOSIS — I509 Heart failure, unspecified: Secondary | ICD-10-CM

## 2021-04-02 DIAGNOSIS — E1140 Type 2 diabetes mellitus with diabetic neuropathy, unspecified: Secondary | ICD-10-CM | POA: Diagnosis not present

## 2021-04-02 DIAGNOSIS — E785 Hyperlipidemia, unspecified: Secondary | ICD-10-CM | POA: Diagnosis not present

## 2021-04-02 DIAGNOSIS — I5033 Acute on chronic diastolic (congestive) heart failure: Secondary | ICD-10-CM | POA: Diagnosis not present

## 2021-04-02 DIAGNOSIS — J449 Chronic obstructive pulmonary disease, unspecified: Secondary | ICD-10-CM | POA: Diagnosis not present

## 2021-04-02 DIAGNOSIS — E1122 Type 2 diabetes mellitus with diabetic chronic kidney disease: Secondary | ICD-10-CM | POA: Diagnosis not present

## 2021-04-02 DIAGNOSIS — I130 Hypertensive heart and chronic kidney disease with heart failure and stage 1 through stage 4 chronic kidney disease, or unspecified chronic kidney disease: Secondary | ICD-10-CM | POA: Diagnosis not present

## 2021-04-02 DIAGNOSIS — I5031 Acute diastolic (congestive) heart failure: Secondary | ICD-10-CM

## 2021-04-02 DIAGNOSIS — E872 Acidosis: Secondary | ICD-10-CM | POA: Diagnosis not present

## 2021-04-02 DIAGNOSIS — R0600 Dyspnea, unspecified: Secondary | ICD-10-CM | POA: Diagnosis not present

## 2021-04-02 DIAGNOSIS — R079 Chest pain, unspecified: Secondary | ICD-10-CM | POA: Diagnosis not present

## 2021-04-02 DIAGNOSIS — D509 Iron deficiency anemia, unspecified: Secondary | ICD-10-CM | POA: Diagnosis not present

## 2021-04-02 DIAGNOSIS — E876 Hypokalemia: Secondary | ICD-10-CM | POA: Diagnosis not present

## 2021-04-02 DIAGNOSIS — Z20822 Contact with and (suspected) exposure to covid-19: Secondary | ICD-10-CM | POA: Diagnosis not present

## 2021-04-02 DIAGNOSIS — G4733 Obstructive sleep apnea (adult) (pediatric): Secondary | ICD-10-CM | POA: Diagnosis not present

## 2021-04-02 DIAGNOSIS — J9610 Chronic respiratory failure, unspecified whether with hypoxia or hypercapnia: Secondary | ICD-10-CM | POA: Diagnosis not present

## 2021-04-02 DIAGNOSIS — N179 Acute kidney failure, unspecified: Secondary | ICD-10-CM

## 2021-04-02 DIAGNOSIS — K219 Gastro-esophageal reflux disease without esophagitis: Secondary | ICD-10-CM | POA: Diagnosis not present

## 2021-04-02 DIAGNOSIS — Z6841 Body Mass Index (BMI) 40.0 and over, adult: Secondary | ICD-10-CM | POA: Diagnosis not present

## 2021-04-02 DIAGNOSIS — R0602 Shortness of breath: Secondary | ICD-10-CM | POA: Diagnosis not present

## 2021-04-02 DIAGNOSIS — N189 Chronic kidney disease, unspecified: Secondary | ICD-10-CM

## 2021-04-02 DIAGNOSIS — Z800 Family history of malignant neoplasm of digestive organs: Secondary | ICD-10-CM | POA: Diagnosis not present

## 2021-04-02 DIAGNOSIS — Z79899 Other long term (current) drug therapy: Secondary | ICD-10-CM | POA: Diagnosis not present

## 2021-04-02 DIAGNOSIS — N1832 Chronic kidney disease, stage 3b: Secondary | ICD-10-CM | POA: Diagnosis not present

## 2021-04-02 DIAGNOSIS — Z888 Allergy status to other drugs, medicaments and biological substances status: Secondary | ICD-10-CM | POA: Diagnosis not present

## 2021-04-02 DIAGNOSIS — Z794 Long term (current) use of insulin: Secondary | ICD-10-CM | POA: Diagnosis not present

## 2021-04-02 LAB — CBG MONITORING, ED
Glucose-Capillary: 107 mg/dL — ABNORMAL HIGH (ref 70–99)
Glucose-Capillary: 139 mg/dL — ABNORMAL HIGH (ref 70–99)

## 2021-04-02 LAB — CBC WITH DIFFERENTIAL/PLATELET
Abs Immature Granulocytes: 0.01 10*3/uL (ref 0.00–0.07)
Basophils Absolute: 0 10*3/uL (ref 0.0–0.1)
Basophils Relative: 1 %
Eosinophils Absolute: 0.2 10*3/uL (ref 0.0–0.5)
Eosinophils Relative: 5 %
HCT: 29.2 % — ABNORMAL LOW (ref 36.0–46.0)
Hemoglobin: 8.9 g/dL — ABNORMAL LOW (ref 12.0–15.0)
Immature Granulocytes: 0 %
Lymphocytes Relative: 25 %
Lymphs Abs: 1 10*3/uL (ref 0.7–4.0)
MCH: 27.6 pg (ref 26.0–34.0)
MCHC: 30.5 g/dL (ref 30.0–36.0)
MCV: 90.7 fL (ref 80.0–100.0)
Monocytes Absolute: 0.3 10*3/uL (ref 0.1–1.0)
Monocytes Relative: 8 %
Neutro Abs: 2.3 10*3/uL (ref 1.7–7.7)
Neutrophils Relative %: 61 %
Platelets: 146 10*3/uL — ABNORMAL LOW (ref 150–400)
RBC: 3.22 MIL/uL — ABNORMAL LOW (ref 3.87–5.11)
RDW: 14 % (ref 11.5–15.5)
WBC: 3.8 10*3/uL — ABNORMAL LOW (ref 4.0–10.5)
nRBC: 0 % (ref 0.0–0.2)

## 2021-04-02 LAB — D-DIMER, QUANTITATIVE: D-Dimer, Quant: 0.46 ug/mL-FEU (ref 0.00–0.50)

## 2021-04-02 LAB — ECHOCARDIOGRAM COMPLETE
AR max vel: 3.04 cm2
AV Area VTI: 2.85 cm2
AV Area mean vel: 2.84 cm2
AV Mean grad: 8 mmHg
AV Peak grad: 14.7 mmHg
Ao pk vel: 1.92 m/s
Area-P 1/2: 2.59 cm2
Height: 61 in
P 1/2 time: 855 msec
S' Lateral: 2.5 cm
Single Plane A4C EF: 65.9 %
Weight: 4197.56 oz

## 2021-04-02 LAB — BASIC METABOLIC PANEL
Anion gap: 11 (ref 5–15)
BUN: 39 mg/dL — ABNORMAL HIGH (ref 6–20)
CO2: 20 mmol/L — ABNORMAL LOW (ref 22–32)
Calcium: 8.7 mg/dL — ABNORMAL LOW (ref 8.9–10.3)
Chloride: 110 mmol/L (ref 98–111)
Creatinine, Ser: 2.17 mg/dL — ABNORMAL HIGH (ref 0.44–1.00)
GFR, Estimated: 25 mL/min — ABNORMAL LOW (ref >60)
Glucose, Bld: 137 mg/dL — ABNORMAL HIGH (ref 70–99)
Potassium: 3.8 mmol/L (ref 3.5–5.1)
Sodium: 141 mmol/L (ref 135–145)

## 2021-04-02 LAB — CERVICOVAGINAL ANCILLARY ONLY
Bacterial Vaginitis (gardnerella): NEGATIVE
Candida Glabrata: NEGATIVE
Candida Vaginitis: NEGATIVE
Chlamydia: NEGATIVE
Comment: NEGATIVE
Comment: NEGATIVE
Comment: NEGATIVE
Comment: NEGATIVE
Comment: NEGATIVE
Comment: NORMAL
Neisseria Gonorrhea: NEGATIVE
Trichomonas: POSITIVE — AB

## 2021-04-02 LAB — HEMOGLOBIN A1C
Hgb A1c MFr Bld: 6 % — ABNORMAL HIGH (ref 4.8–5.6)
Mean Plasma Glucose: 125.5 mg/dL

## 2021-04-02 LAB — PROCALCITONIN: Procalcitonin: <0.1 ng/mL

## 2021-04-02 LAB — MAGNESIUM: Magnesium: 1.8 mg/dL (ref 1.7–2.4)

## 2021-04-02 LAB — GLUCOSE, CAPILLARY
Glucose-Capillary: 141 mg/dL — ABNORMAL HIGH (ref 70–99)
Glucose-Capillary: 139 mg/dL — ABNORMAL HIGH (ref 70–99)

## 2021-04-02 LAB — TROPONIN I (HIGH SENSITIVITY): Troponin I (High Sensitivity): 5 ng/L (ref <18)

## 2021-04-02 MED ORDER — FERROUS SULFATE 325 (65 FE) MG PO TABS
325.0000 mg | ORAL_TABLET | Freq: Every day | ORAL | Status: DC
  Administered 2021-04-02 – 2021-04-04 (×3): 325 mg via ORAL
  Filled 2021-04-02 (×3): qty 1

## 2021-04-02 MED ORDER — FUROSEMIDE 10 MG/ML IJ SOLN
40.0000 mg | Freq: Once | INTRAMUSCULAR | Status: AC
  Administered 2021-04-02: 40 mg via INTRAVENOUS
  Filled 2021-04-02: qty 4

## 2021-04-02 MED ORDER — INSULIN ASPART 100 UNIT/ML IJ SOLN
0.0000 [IU] | Freq: Three times a day (TID) | INTRAMUSCULAR | Status: DC
  Administered 2021-04-02 (×2): 1 [IU] via SUBCUTANEOUS
  Administered 2021-04-03: 2 [IU] via SUBCUTANEOUS
  Administered 2021-04-03: 1 [IU] via SUBCUTANEOUS
  Administered 2021-04-03: 2 [IU] via SUBCUTANEOUS
  Administered 2021-04-04: 1 [IU] via SUBCUTANEOUS
  Administered 2021-04-04: 2 [IU] via SUBCUTANEOUS

## 2021-04-02 MED ORDER — LABETALOL HCL 200 MG PO TABS
300.0000 mg | ORAL_TABLET | Freq: Two times a day (BID) | ORAL | Status: DC
  Administered 2021-04-02 (×2): 300 mg via ORAL
  Filled 2021-04-02 (×2): qty 2

## 2021-04-02 MED ORDER — HEPARIN SODIUM (PORCINE) 5000 UNIT/ML IJ SOLN
5000.0000 [IU] | Freq: Three times a day (TID) | INTRAMUSCULAR | Status: DC
  Administered 2021-04-02 – 2021-04-04 (×6): 5000 [IU] via SUBCUTANEOUS
  Filled 2021-04-02 (×6): qty 1

## 2021-04-02 MED ORDER — HYDRALAZINE HCL 50 MG PO TABS
50.0000 mg | ORAL_TABLET | Freq: Three times a day (TID) | ORAL | Status: DC
  Administered 2021-04-02 – 2021-04-04 (×8): 50 mg via ORAL
  Filled 2021-04-02 (×2): qty 1
  Filled 2021-04-02: qty 2
  Filled 2021-04-02 (×5): qty 1

## 2021-04-02 MED ORDER — CARVEDILOL 6.25 MG PO TABS
6.2500 mg | ORAL_TABLET | Freq: Two times a day (BID) | ORAL | Status: DC
  Administered 2021-04-02 – 2021-04-04 (×5): 6.25 mg via ORAL
  Filled 2021-04-02 (×5): qty 1

## 2021-04-02 MED ORDER — ACETAMINOPHEN 325 MG PO TABS: 650.0000 mg | ORAL_TABLET | Freq: Four times a day (QID) | ORAL | Status: DC | PRN

## 2021-04-02 MED ORDER — INSULIN GLARGINE-YFGN 100 UNIT/ML ~~LOC~~ SOLN
60.0000 [IU] | Freq: Every day | SUBCUTANEOUS | Status: DC
  Administered 2021-04-03: 60 [IU] via SUBCUTANEOUS
  Filled 2021-04-02 (×4): qty 0.6

## 2021-04-02 MED ORDER — INSULIN ASPART 100 UNIT/ML IJ SOLN: 0.0000 [IU] | Freq: Every day | INTRAMUSCULAR | Status: DC

## 2021-04-02 MED ORDER — ALBUTEROL SULFATE (2.5 MG/3ML) 0.083% IN NEBU: 2.5000 mg | INHALATION_SOLUTION | RESPIRATORY_TRACT | Status: DC | PRN

## 2021-04-02 MED ORDER — FUROSEMIDE 10 MG/ML IJ SOLN
80.0000 mg | Freq: Two times a day (BID) | INTRAMUSCULAR | Status: DC
  Administered 2021-04-02 – 2021-04-04 (×4): 80 mg via INTRAVENOUS
  Filled 2021-04-02 (×4): qty 8

## 2021-04-02 MED ORDER — BUDESONIDE 0.5 MG/2ML IN SUSP
1.0000 mg | Freq: Two times a day (BID) | RESPIRATORY_TRACT | Status: DC
  Administered 2021-04-02 – 2021-04-04 (×5): 1 mg via RESPIRATORY_TRACT
  Filled 2021-04-02 (×6): qty 4

## 2021-04-02 MED ORDER — ATORVASTATIN CALCIUM 40 MG PO TABS
40.0000 mg | ORAL_TABLET | Freq: Every morning | ORAL | Status: DC
  Administered 2021-04-02 – 2021-04-04 (×3): 40 mg via ORAL
  Filled 2021-04-02 (×3): qty 1

## 2021-04-02 MED ORDER — PREGABALIN 75 MG PO CAPS
75.0000 mg | ORAL_CAPSULE | Freq: Two times a day (BID) | ORAL | Status: DC
  Administered 2021-04-02 – 2021-04-04 (×6): 75 mg via ORAL
  Filled 2021-04-02: qty 1
  Filled 2021-04-02: qty 3
  Filled 2021-04-02: qty 1
  Filled 2021-04-02: qty 3
  Filled 2021-04-02 (×2): qty 1

## 2021-04-02 MED ORDER — ACETAMINOPHEN 650 MG RE SUPP: 650.0000 mg | Freq: Four times a day (QID) | RECTAL | Status: DC | PRN

## 2021-04-02 MED ORDER — AMLODIPINE BESYLATE 10 MG PO TABS
10.0000 mg | ORAL_TABLET | Freq: Every day | ORAL | Status: DC
  Administered 2021-04-02 – 2021-04-04 (×3): 10 mg via ORAL
  Filled 2021-04-02: qty 2
  Filled 2021-04-02 (×2): qty 1

## 2021-04-02 NOTE — ED Provider Notes (Signed)
  Physical Exam  BP (!) 171/82   Pulse 87   Temp 97.7 F (36.5 C) (Oral)   Resp 15   Ht 5\' 1"  (1.549 m)   Wt 119 kg   LMP 04/01/2010   SpO2 98%   BMI 49.57 kg/m   Physical Exam  ED Course/Procedures     Procedures  MDM  I assumed care for this patient at shift change.  She was complaining of several months of dyspnea on exertion and a recent weight gain despite increased doses of Lasix.  ED work-up revealed acute kidney injury, likely secondary to increased Lasix doses.  O2 sat dropped to 93% with ambulation.  No clear etiology of the source of her symptoms here in the ED, but requires admission secondary to work of breathing, acute kidney injury.  Case discussed with Dr. Marlowe Sax.       Arnaldo Natal, MD 04/02/21 [phone]

## 2021-04-02 NOTE — ED Notes (Signed)
Tele  Breakfast Ordered 

## 2021-04-02 NOTE — ED Notes (Signed)
PT placed on 2LPM [place] 

## 2021-04-02 NOTE — ED Notes (Signed)
Phelbotomist at bedside drawing labs at this time.

## 2021-04-02 NOTE — Progress Notes (Signed)
Patient seen and examined personally, I reviewed the chart, history and physical and admission note, done by admitting physician this morning and agree with the same with following addendum.  Please refer to the morning admission note for more detailed plan of care.  Briefly,  60 year old 60 year old female with chronic diastolic CHF, COPD, morbid obesity BMI 49, GERD, HTN, IDDM, CKD stage IIIb, chronic anemia, chronic hepatitis C seen at PCP office for 15 pound weight gain in 1 week, progressively worsening dyspnea on exertion despite taking oral diuretics at home sent to the ED for further evaluation. In the ED labs stable with BUN 43/creatinine 2.2 previously 1.7-1.8 in July 2022, chest x-ray no active cardiopulmonary disease, given her presentation admitted for acute on chronic diastolic CHF, also had 1 episode of left-sided chest pain in the morning of day of admission but no recurrence.  Patient seen in the ED Patient reports her shortness of breath is some better. Able to lay flat but legs are significantly swollen with 3+ pitting edema.   Issues:  Acute on chronic diastolic CHF: Presented with 15 pound weight gain in a week and progressive dyspnea x2 weeks despite taking oral Lasix.  Previous weight on 03/26/21 112.5 kg on admission 119 kg.  recent echo April 2021 EF 60-65% G2 DD.  On admission peripheral edema BNP low likely from morbid obesity chest x-ray no pulmonary edema, given IV Lasix x1 renal functions being monitored for diuresis repeat echo ordered.  On low-salt diet with fluid restriction monitor intake output Daily weight.  D-dimer was negative.  Creatinine is stable-continue on Lasix 80 mg twice daily per cardiology.  GDMT-changing labetalol to Coreg, continued on hydralazine add IMDUR if BP allows, she is allergic to lisinopril. Net IO Since Admission: No IO data has been entered for this period [04/02/21 1326]  Filed Weights   04/01/21 1237  Weight: 119 kg   No intake or output  data in the 24 hours ending 04/02/21 1326   Chest pain x1 negative D-dimer no acute ischemia suspected high-sensitivity troponin negative x2  AKI on CKD stage IIIb Normal anion gap metabolic acidosis due to CKD :BUN 43/creatinine 2.2 previously 1.7-1.8 in July 2022. Recent Labs  Lab 04/01/21 1242  BUN 43*  CREATININE 2.26*   COPD-on Pulmicort nebulizer and bronchodilators.  Diminished breath sounds.  Continue same Hypertension-on amlodipine, hydralazine and labetalol  HLD on Lipitor  IDDM with diabetic neuropathy, HbA1c 7.6 12/05/2020, at home on 60 Lantus bedtime.  Continue same and sliding scale.  Continue Lyrica for neuropathy  Chronic anemia likely from chronic disease-stable  Morbid obesity BMI 49: Will benefit with weight loss, PCP follow-up and sleep apnea evaluation as outpatient

## 2021-04-02 NOTE — Plan of Care (Signed)

## 2021-04-02 NOTE — Progress Notes (Addendum)
Heart Failure Nurse Navigator Progress Note  PCP: [name], MD PCP-Cardiologist: Vita Barley., MD Admission Diagnosis: A/C dHF Admitted from: home with significant other  Presentation:   [name] [name] presented 9/14 with increased weight gain and SOB. Pt resting in bed on 2LPM via [place]. Pt interactive with interview process. Pt states she eats a lot of processed and fast foods high in sodium. Spent time educating on sodium and fluid restrictions. Pt completed 9th grade and states she has trouble with reading/comprehension--PLEASE GIVE AUDITORY EDUCATION RATHER THAN HANDING PAPER/PAMPHLET. Pt states she does not drive, but has friend to take her. Consulted CSW to enroll in [employer] for future use. Lives with significant other and 60yo Godson. Currently SCr 2.17, HV TOC pending renal improvement.   ECHO/ LVEF: 65-70%, mild to mod TVR.   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   Qualifier: Diagnosis of  By: Jorene Minors, Scott     Hypertension    MRSA (methicillin resistant Staphylococcus aureus)    Trichomonas    VAGINITIS, BACTERIAL, RECURRENT 08/15/2007   Qualifier: Diagnosis of  By: Radene Ou MD, [place]       Social History   Socioeconomic History   Marital status: Legally Separated    Spouse name: Not on file   Number of children: 1   Years of education: Not on file   Highest education level: 9th grade  Occupational History   Occupation: disability  Tobacco Use   Smoking status: Never   Smokeless tobacco: Never  Vaping Use   Vaping Use: Never used  Substance and Sexual Activity   Alcohol use: No   Drug use: Not Currently    Types: Cocaine, Marijuana    Comment: remote h/o cocaine 2015 and marijuana 2018   Sexual activity: Not Currently  Other Topics Concern   Not on  file  Social History Narrative   Lives with friend, Rosalee Kaufman and nephew.  One daughter.    Social Determinants of Health   Financial Resource Strain: Low Risk    Difficulty of Paying Living Expenses: Not very hard  Food Insecurity: No Food Insecurity   Worried About Charity fundraiser in the Last Year: Never true   Arboriculturist in the Last Year: Never true  Transportation Needs: Public librarian (Medical): No   Lack of Transportation (Non-Medical): Yes  Physical Activity: Not on file  Stress: Not on file  Social Connections: Not on file    High Risk Criteria for Readmission and/or Poor Patient Outcomes: Heart failure hospital admissions (last 6 months): 1  No Show rate: 18% Difficult social situation: no Demonstrates medication adherence: yes Primary Language: English Literacy level: pt states she has trouble reading and comprehending.   Education Assessment and Provision:  Detailed education and instructions provided on heart failure disease management including the following:  Signs and symptoms of Heart Failure When to call the physician Importance of daily weights Low sodium diet Fluid restriction Medication management Anticipated future follow-up appointments  Patient education given on each of the above topics.  Patient acknowledges understanding via teach back method and acceptance of all instructions.  Education Materials:  "Living Better With Heart Failure" Booklet, HF zone tool, & Daily Weight Tracker Tool.  Patient has  scale at home: YES Patient has pill box at home: yes   Barriers of Care:   -HF education -fixed income -dietary indiscretion  Considerations/Referrals:   Referral made to Heart Failure Pharmacist Stewardship: no Referral made to Heart Failure CSW/NCM TOC: no Referral made to Heart & Vascular TOC clinic: yes, 9/26 @ 3pm--pending renal improvement.  Items for Follow-up on DC/TOC: -optimize -dietary  modification   Pricilla Holm, MSN, RN Heart Failure Nurse Navigator [phone]

## 2021-04-02 NOTE — Consult Note (Addendum)
Cardiology Consultation:   Patient ID: [name] [name] MRN: [id number]; DOB: Jul 16, 1961  Admit date: 04/01/2021 Date of Consult: 04/02/2021  PCP:  [name] Rakes, MD   [facility] Providers Cardiologist:  Minus Breeding, MD   {   Patient Profile:   [name] [name] is [name] 60 y.o. female with [name] hx of chronic diastolic heart failure, hypertension, type 2 diabetes, CKD stage IIIb, chronic iron deficiency anemia, chronic hepatitis C, morbid obesity with BMI 49.57, OSA, who is being seen 04/02/2021 for the evaluation of decompensated heart failure at the request of [name] [name].  History of Present Illness:   [name] [name] follows Dr. Percival [name] outpatient for chronic diastolic heart failure and hypertension since 01/2019.    She had remote stress Myovue from 12/29/2009 which revealed diaphragmatic attenuation of inferior wall, otherwise normal exam without evidence of exercise-induced MI.  LV EF was 63%.    Echo from 01/30/2019 with EF 50% and mild diffuse hypokinesis, moderate concentric LVH, diastolic dysfunction, elevated LA and LVEDP, mild dilated LA.  LVH etiology was felt due to uncontrolled hypertension, she was initiated on antihypertensive including amlodipine, Lasix, hydralazine, lisinopril, labetalol over the years.  She was recommended [name] PYP scan at one-point, this was not completed due to facial covering concerns.    She was hospitalized for decompensated diastolic heart failure due to dietary medication noncompliance on 11/06/2019, Echo repeated 11/07/19 revealed EF 60 to 65%, no RMWA, mild LVH, grade II DD. She was diuresed with IV Lasix and eventually discharged on Lasix 40 mg twice daily.   She has not been seen by our office since 10/2019 hospitalization.   Patient presented to the ER on 04/01/2021 with chief complaints of progressively worsening of shortness of breath with exertion, weight gain of 15 pounds in 1 week, worsening bilateral lower extremity edema, cough with nasal congestion,  and 1 episodes of left-sided chest pain that resolved spontaneously.   During encounter, she confirms she had worsening SOB and leg swelling and weight gain over the past 1 week.  She noted some dry cough but does not feel too bad about it. She reports some chills, no fever. She states she had few minutes left sided chest tightness yesterday that has resolved, denied any exertional angina. She denied any dizziness or syncope. She states she is urinating like normal, denied oliguria.   Admission diagnostic revealed AKI with creatinine 2.26 and GFR of 24, bicarb 19, BUN 43.  High sensitive troponin negative x2, BNP WNL 92.8.  CBC revealed anemia with hemoglobin 9.1, otherwise unremarkable.  D-dimer negative.  A1c 6%.  Flu and COVID-negative.  EKG revealed sinus rhythm with ventricular rate of 77 bpm,  manually corrected QT 451mec, no acute ischemic change noted.  She was admitted to hospital medicine service, given IV Lasix 40 mg x 1 dose, Echo repeated today and is pending, cardiology is asked to see the patient today.    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 Authorizing Provider  albuterol (VENTOLIN HFA) 108 (90 Base) MCG/ACT inhaler Inhale 1 puff into the lungs every 4 (four) hours as needed for wheezing or shortness of breath. 12/10/20  Yes [name] [name], Enobong, MD  amLODipine (NORVASC) 10 MG  tablet Take 1 tablet (10 mg total) by mouth daily. 09/03/20  Yes [name] [name], [name] Kluver A, FNP  Artificial Tear Ointment (DRY EYES OP) Place 1 drop into both eyes daily as needed (dry eyes).   Yes [provider]  atorvastatin (LIPITOR) 40 MG tablet Take 1 tablet (40 mg total) by mouth every morning. Must have HF appt for further refills 12/09/20  Yes [name] [name] A, FNP  blood glucose meter kit and supplies KIT Dispense based on patient and insurance preference. Use up to four times daily as directed. (FOR ICD-9 250.00, 250.01). 04/12/19  Yes [name] Flock, MD  doxycycline (VIBRAMYCIN) 100 MG capsule Take 1 capsule (100 mg total) by mouth 2 (two) times daily. Patient taking differently: Take 100 mg by mouth See admin instructions. Bid x 7 days 03/26/21  Yes Raul Del, Conner M, PA-C  ferrous sulfate 325 (65 FE) MG tablet Take 325 mg by mouth daily with breakfast.   Yes [provider]  fluticasone (FLOVENT HFA) 110 MCG/ACT inhaler Inhale 2 puffs into the lungs 2 (two) times daily. 02/23/21  Yes [name] Rakes, MD  furosemide (LASIX) 40 MG tablet Take 1 tablet (40 mg total) by mouth 2 (two) times daily. 12/05/19 04/02/21 Yes [name] [name], [name] Kluver A, FNP  hydrALAZINE (APRESOLINE) 50 MG tablet Take 1 tablet (50 mg total) by mouth 3 (three) times daily. 04/01/21  Yes [name] [name], [name] Ferretti, MD  labetalol (NORMODYNE) 300 MG tablet TAKE ONE TABLET BY MOUTH TWICE DAILY Patient taking differently: Take 300 mg by mouth 2 (two) times daily. 03/13/21  Yes [name] [name], [name] Ferretti, MD  LANTUS SOLOSTAR 100 UNIT/ML Solostar Pen INJECT 60 UNITS SUBCUTANEOUSLY EVERYDAY AT BEDTIME Patient taking differently: Inject 60 Units into the skin at bedtime. 03/13/21  Yes [name] [name], [name] Ferretti, MD  pregabalin (LYRICA) 75 MG capsule TAKE ONE CAPSULE BY MOUTH EVERY MORNING and TAKE ONE CAPSULE BY MOUTH EVERY EVENING Patient taking differently: Take 75 mg by mouth 2 (two) times daily. 12/10/20  Yes [name] [name], [name] Ferretti, MD  VICTOZA 18 MG/3ML SOPN INJECT 1.56m into  THE SKIN DAILY AS DIRECTED Patient taking differently: Inject 1.8 mg into the skin daily. 03/13/21  Yes NCharlott Rakes MD  Vitamin D, Ergocalciferol, (DRISDOL) 1.25 MG (50000 UNIT) CAPS capsule Take 50,000 Units by mouth every Monday. 09/09/20  Yes [provider]  albuterol (PROVENTIL) (2.5 MG/3ML) 0.083% nebulizer solution Take 3 mLs (2.5 mg total) by nebulization every 4 (four) hours as needed for wheezing or shortness of breath. Patient not taking: No sig reported 02/12/21   Henderly, Britni A, PA-C  furosemide (LASIX) 40 MG tablet Take 1 tablet (40 mg total) by mouth daily for 5 days. Patient not taking: Reported on 04/02/2021 02/12/21 02/17/21  HShelby DubinA, PA-C    Inpatient Medications: Scheduled Meds:  amLODipine  10 mg Oral Daily   atorvastatin  40 mg Oral q morning   budesonide  1 mg Inhalation BID   carvedilol  6.25 mg Oral BID WC   ferrous sulfate  325 mg Oral Q breakfast   furosemide  80 mg Intravenous BID   heparin  5,000  Units Subcutaneous Q8H   hydrALAZINE  50 mg Oral TID   insulin aspart  0-5 Units Subcutaneous QHS   insulin aspart  0-9 Units Subcutaneous TID WC   insulin glargine-yfgn  60 Units Subcutaneous QHS   pregabalin  75 mg Oral BID   Continuous Infusions:  PRN Meds: acetaminophen **OR** acetaminophen, albuterol  Allergies:    Allergies  Allergen Reactions   Lisinopril Swelling    Social History:   Social History   Socioeconomic History   Marital status: Legally Separated    Spouse name: Not on file   Number of children: Not on file   Years of education: Not on file   Highest education level: Not on file  Occupational History   Occupation: disability  Tobacco Use   Smoking status: Never   Smokeless tobacco: Never  Vaping Use   Vaping Use: Never used  Substance and Sexual Activity   Alcohol use: No   Drug use: No    Types: Cocaine, Marijuana    Comment: remote h/o cocaine and marijuana use   Sexual activity: Yes    Birth  control/protection: None  Other Topics Concern   Not on file  Social History Narrative   Lives with friend, Rosalee Kaufman and nephew.  One daughter.    Social Determinants of Health   Financial Resource Strain: Not on file  Food Insecurity: Not on file  Transportation Needs: No Transportation Needs   Lack of Transportation (Medical): No   Lack of Transportation (Non-Medical): No  Physical Activity: Not on file  Stress: Not on file  Social Connections: Not on file  Intimate Partner Violence: Not At Risk   Fear of Current or Ex-Partner: No   Emotionally Abused: No   Physically Abused: No   Sexually Abused: No    Family History:    Family History  Problem Relation Age of Onset   Colon cancer Mother    Liver disease Sister    Other Neg Hx    Breast cancer Neg Hx    Esophageal cancer Neg Hx    Rectal cancer Neg Hx      ROS:  Constitutional: see HPI  Eyes: Denied vision change or loss Ears/Nose/Mouth/Throat: Denied ear ache, sore throat, coughing, sinus pain Cardiovascular: see HPI  Respiratory: see HPI  Gastrointestinal: Denied nausea, vomiting, abdominal pain, diarrhea Genital/Urinary: Denied oliguria  Musculoskeletal: Denied muscle ache, joint pain, weakness Skin: Denied rash, wound Neuro: Denied headache, dizziness, syncope Psych: Denied history of depression/anxiety  Endocrine: history of diabetes   Physical Exam/Data:   Vitals:   04/02/21 0515 04/02/21 0600 04/02/21 0700 04/02/21 0943  BP:  129/79 (!) 156/84 131/67  Pulse:  81 79 80  Resp:  '15 13 13  ' Temp:   97.9 F (36.6 C)   TempSrc:   Oral   SpO2: 93% 98% 98% 99%  Weight:      Height:       No intake or output data in the 24 hours ending 04/02/21 1140 Last 3 Weights 04/01/2021 04/01/2021 03/26/2021  Weight (lbs) 262 lb 5.6 oz 263 lb 6.4 oz 248 lb  Weight (kg) 119 kg 119.477 kg 112.492 kg     Body mass index is 49.57 kg/m.   Vitals:  Vitals:   04/02/21 0700 04/02/21 0943  BP: (!) 156/84 131/67  Pulse:  79 80  Resp: 13 13  Temp: 97.9 F (36.6 C)   SpO2: 98% 99%   General Appearance: In no apparent distress, ill appearing HEENT: Normocephalic,  atraumatic.  Neck: Supple, trachea midline, no JVDs  Cardiovascular: Regular rate and rhythm, normal Z6-X0,  systolic murmur LSB grade II Respiratory: Resting breathing unlabored, +DOE, lungs sounds with left base rales to auscultation, no use of accessory muscles. On 2LNC pox 96%  Gastrointestinal: Bowel sounds positive, abdomen soft, non-tender, non-distended. Extremities: Able to move all extremities in bed without difficulty, 1+ edema of BLE Genitourinary:genital exam not performed Musculoskeletal: Normal muscle bulk and tone Skin: Intact, warm, dry. No rashes or petechiae noted in exposed areas.  Neurologic: Alert, oriented to person, place and time. Fluent speech,  no cognitive deficit,  no gross focal neuro deficit Psychiatric: Normal affect. Mood is appropriate.    EKG:  The EKG was personally reviewed and demonstrates:   Sinus rhythm with ventricular rate of 77 bpm, no acute ischemic change noted  Telemetry:  Telemetry was personally reviewed and demonstrates:  SR with ventricular rate of 80s  Relevant CV Studies:  Echocardiogram is pending today  Echo from 11/07/2019:   1. Left ventricular ejection fraction, by estimation, is 60 to 65%. The  left ventricle has normal function. The left ventricle has no regional  wall motion abnormalities. There is mild left ventricular hypertrophy.  Left ventricular diastolic parameters are consistent with Grade II diastolic dysfunction (pseudonormalization).   2. Right ventricular systolic function is normal. The right ventricular  size is normal.   3. The mitral valve is normal in structure. No evidence of mitral valve  regurgitation.   4. The aortic valve is tricuspid. Aortic valve regurgitation is not  visualized.    Laboratory Data:  High Sensitivity Troponin:   Recent Labs  Lab  04/01/21 1242 04/01/21 2311  TROPONINIHS 7 5     Chemistry Recent Labs  Lab 04/01/21 1242 04/02/21 0846  NA 143 141  K 3.6 3.8  CL 113* 110  CO2 19* 20*  GLUCOSE 105* 137*  BUN 43* 39*  CREATININE 2.26* 2.17*  CALCIUM 8.8* 8.7*  GFRNONAA 24* 25*  ANIONGAP 11 11    Recent Labs  Lab 04/01/21 1242  PROT 7.0  ALBUMIN 3.9  AST 15  ALT 13  ALKPHOS 58  BILITOT 0.7   Hematology Recent Labs  Lab 04/01/21 1242  WBC 4.4  RBC 3.29*  HGB 9.1*  HCT 30.0*  MCV 91.2  MCH 27.7  MCHC 30.3  RDW 14.2  PLT 153   BNP Recent Labs  Lab 04/01/21 1242  BNP 92.8    DDimer  Recent Labs  Lab 04/02/21 0243  DDIMER 0.46     Radiology/Studies:  DG Chest 2 View  Result Date: 04/01/2021 CLINICAL DATA:  Dyspnea on exertion, chest pain. EXAM: CHEST - 2 VIEW COMPARISON:  February 12, 2021. FINDINGS: Stable cardiomediastinal silhouette. Both lungs are clear. The visualized skeletal structures are unremarkable. IMPRESSION: No active cardiopulmonary disease. Electronically Signed   By: Marijo Conception M.D.   On: 04/01/2021 13:17     Assessment and Plan:   Acute on chronic diastolic heart failure -Presented with worsening DOE, leg edema, weight gain >15 pounds in 1 week, chest pain - CXR revealed no acute cardiopulmonary disease, POA - BNP WNL, POA - HS trop negative x2 - EKG without acute ischemic change - Weight was 112.7kg on 11/08/19, weight is 119 kg POA, query accuracy of 15 lbs report - UOP is pending, self reports no oliguric  - Please monitor strict intake and output, daily weight - Echo repeat is pending report today - Clinically mildly hypervolemic  on exam today  - s/p IV Lasix 1m x1 since admission, no UOP recorded, on 432mBID at home, lab showing AKI, will increase Lasix to 8058mID today, monitor UOP and reponse, if not clinically and renally further improving, consider nephrology consult to guide diuresis  - GDMT: will transition labetalol 300m42mD to Coreg 6.25mg41mD, continue hydralazine 50mg 65m if BP allows may add on Imdur, noted she is allergic to lisinopril  - will repeat CBC diff, check procal and resp viral panel rule out infectious cause of DOE   AKI on CKD IIIb Non anion gap metabolic acidosis  - Cr 2.26 P6.04in 1.8 ranges in July 2022, 2.17 today s/p Lasix 40mg x85msterday - consider AKI workup rule out other etiology, defer to IM  - trend BMP and electrolyte daily while diuresis, consider nephrology consult if renal index worsening  HTN - BP fairly controlled on amlodipine, hydralazine, and labetalol currently  - see changes as above for CHF   HLD - continue statin   COPD Type 2 DM  Chronic iron deficiency anemia  OSA not using CPAP Morbid obesity  - managed per IM    Risk Assessment/Risk Scores:    New York Heart Association (NYHA) Functional Class NYHA Class III   For questions or updates, please contact [facility] Please consult [email or web address] for contact info under    Signed, Xika ZhMargie Billet/15/2022 11:40 AM  History and all data above reviewed.  Patient examined.  I agree with the findings as above.  He has [name] history of longstanding dyspnea exertion and chronic diastolic heart failure.  She had until recently apparently had as needed oxygen.  He has had ER visits for COPD.  She reports that her baseline is that she needs [name] cane and [name] walker that she uses [name] motorized scooter at [name] grocery store.  I have seen him in the past because of mildly reduced ejection fraction although it was improved at the last visit.  She has had some mild left ventricular hypertrophy and I had suggested previously making sure there is no infiltrative cardiomyopathy by ordering PYP scan.  She did not want to have this done.  She presents to the emergency room with no acute on chronic shortness of breath.  She states she has not been able to get up the stairs t the top of her home and has been staying downstairs.  She reports weight gain and  leg swelling per tickly over the past couple of weeks.  She does not weigh her self routinely.  She does use salt.  She does not abuse fluid intake however.  She sleeps chronically on several pillows but this is unchanged.  He has had some cough she says productive of mucus.  She is not having any fevers but she has had chills.  The patient exam reveals COR: Regular rate and rhythm, no murmurs,  Lungs: Clear to auscultation bilaterally,  Abd: Positive bowel sounds normal in frequency and pitch, no bruits, rebound, guarding, Ext moderate no extremity swelling.  All available labs, radiology testing, previous records reviewed. Agree with documented assessment and plan.  Acute on chronic diastolic heart failure: The patient's ejection fraction is well-preserved and there is no significant LVH on exam.  She does have some elevated pulmonary pressures.  Probably somewhat preload dependent explaining some of her renal insufficiency with diuresis.  I think it would keep her feet elevated and have her wear compression stocking should probably  do well with increased IV diuresis.  We will need [name] course to watch her renal function closely.  Of note she did have home O2 which was recently taken away as the canisters wore off.  We will have to follow her to see if she qualifies for this.  Ultimately she might need right heart catheterization but I think we can manage her with medicines and see how well she decongests.  I suspect her dyspnea is multifactorial as she does have some chronic lung disease as well.  We will continue with education.  We will also pursue aggressive blood pressure control.  Hypertension blood pressures elevated on presentation.  He reports being compliant with her medications and we will switch from labetalol to carvedilol and uptitrate.  Jeneen Rinks Bayle Calvo  2:56 PM  04/02/2021

## 2021-04-02 NOTE — H&P (Signed)
History and Physical    [name] [name] OQH:[id number] DOB: [birth year] DOA: 04/01/2021  PCP: [name], MD Patient coming from: Home  Chief Complaint: Shortness of breath, chest pain  HPI: [name] [name] is a 60 y.o. female with medical history significant of chronic diastolic CHF, COPD, morbid obesity (BMI 49.57), GERD, hypertension, insulin-dependent diabetes, CKD stage IIIb, chronic anemia, chronic hep C presented to her PCPs office yesterday with complaints of progressively worsening dyspnea on exertion and 15 pound weight gain in 1 week despite taking oral diuretic at home.  She was referred to the ED for IV diuresis.  In the ED, patient also reported chest pain.  Vital signs stable on arrival.  Satting 98% on room air at rest but sats dropped to 93% with ambulation.  Labs showing WBC 4.4, hemoglobin 9.1 (stable), platelet count 153k.  Sodium 143, potassium 3.6, chloride 113, bicarb 19, anion gap 11, BUN 43, creatinine 2.2 (was 1.7-1.8 in July 2022), glucose 105.  BNP within normal range.  High-sensitivity troponin negative x2.  EKG without acute ischemic changes.  COVID and influenza PCR negative.  Chest x-ray showing no active cardiopulmonary disease. No medications administered.  Patient reports 2-week history of dyspnea on exertion and reports 15 pound weight gain in 1 week.  Reports bilateral lower extremity edema.  Also reports cough and nasal congestion.  States she takes a fluid pill at home but does not remember the name or dose.  She does recall missing doses of her fluid pill 2 weeks ago.  States she had 1 episode of left-sided chest pain earlier today but is not having any chest pain anymore.  No other complaints.  Review of Systems:  All systems reviewed and apart from history of presenting illness, are negative.  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 She has never used smokeless tobacco. She reports that she does not drink alcohol and does not use drugs.  Allergies  Allergen Reactions   Lisinopril Swelling    Family History  Problem Relation Age of Onset   Colon cancer Mother    Liver disease Sister    Other Neg Hx    Breast cancer Neg Hx    Esophageal cancer Neg Hx    Rectal cancer Neg Hx     Prior to Admission medications   Medication Sig Start Date End Date Taking? Authorizing Provider  albuterol (PROVENTIL) (2.5 MG/3ML) 0.083% nebulizer solution Take 3 mLs (2.5 mg total) by nebulization every 4 (four) hours as needed for wheezing or shortness of breath. 02/12/21   Henderly, Britni A, PA-C  albuterol (VENTOLIN HFA) 108 (90 Base) MCG/ACT inhaler Inhale 1 puff into the lungs every 4 (four) hours as needed for wheezing or shortness of breath. 12/10/20   [name], MD  amLODipine (NORVASC) 10 MG tablet Take 1 tablet (10 mg  total) by mouth daily. 09/03/20   [name], FNP  atorvastatin (LIPITOR) 40 MG tablet Take 1 tablet (40 mg total) by mouth every morning. Must have HF appt for further refills 12/09/20   [name], FNP  blood glucose meter kit and supplies KIT Dispense based on patient and insurance preference. Use up to four times daily as directed. (FOR ICD-9 250.00, 250.01). 04/12/19   Dustin Flock, MD  doxycycline (VIBRAMYCIN) 100 MG capsule Take 1 capsule (100 mg total) by mouth 2 (two) times daily. 03/26/21   Hendricks Limes, PA-C  ferrous sulfate 325 (65 FE) MG tablet Take 325 mg by  mouth daily with breakfast.    [provider]  fluticasone (FLOVENT HFA) 110 MCG/ACT inhaler Inhale 2 puffs into the lungs 2 (two) times daily. 02/23/21   [name], MD  furosemide (LASIX) 40 MG tablet Take 1 tablet (40 mg total) by mouth 2 (two) times daily. 12/05/19 12/04/20  [name], FNP  furosemide (LASIX) 40 MG tablet Take 40 mg by mouth 2 (two) times daily.    [provider]  furosemide (LASIX) 40 MG tablet Take 1 tablet (40 mg total) by mouth daily for 5 days. 02/12/21 02/17/21  Henderly, Britni A, PA-C  hydrALAZINE (APRESOLINE) 50 MG tablet Take 1 tablet (50 mg total) by mouth 3 (three) times daily. 04/01/21   [name], MD  labetalol (NORMODYNE) 300 MG tablet TAKE ONE TABLET BY MOUTH TWICE DAILY 03/13/21   [name], MD  LANTUS SOLOSTAR 100 UNIT/ML Solostar Pen INJECT 60 UNITS SUBCUTANEOUSLY EVERYDAY AT BEDTIME 03/13/21   [name], MD  pregabalin (LYRICA) 75 MG capsule TAKE ONE CAPSULE BY MOUTH EVERY MORNING and TAKE ONE CAPSULE BY MOUTH EVERY EVENING 12/10/20   [name], MD  VICTOZA 18 MG/3ML SOPN INJECT 1.56m into THE SKIN DAILY AS DIRECTED 03/13/21   NCharlott Rakes MD  Vitamin D, Ergocalciferol, (DRISDOL) 1.25 MG (50000 UNIT) CAPS capsule Take 1 capsule by mouth once a week. 09/09/20   [provider]    Physical Exam: Vitals:   04/01/21 2304 04/02/21 0000 04/02/21 0100 04/02/21 0130  BP: (!) 145/86 (!) 171/82 (!) 159/75   Pulse: 88 87 89   Resp: '19 15 17   ' Temp:      TempSrc:      SpO2: 97% 98%  99%  Weight:      Height:        Physical Exam Constitutional:      General: She is not in acute distress. HENT:     Head: Normocephalic and atraumatic.  Eyes:     Extraocular Movements: Extraocular movements intact.  Cardiovascular:     Rate and Rhythm: Normal rate and regular rhythm.     Pulses: Normal pulses.  Pulmonary:     Effort: Pulmonary effort is normal. No respiratory distress.     Breath sounds: Normal  breath sounds. No wheezing or rales.  Abdominal:     General: Bowel sounds are normal.     Palpations: Abdomen is soft.     Tenderness: There is no abdominal tenderness. There is no guarding.  Musculoskeletal:     Cervical back: Normal range of motion and neck supple.     Right lower leg: Edema present.     Left lower leg: Edema present.     Comments: +2 to +3 pitting edema of bilateral lower extremities  Skin:    General: Skin is warm and dry.  Neurological:  General: No focal deficit present.     Mental Status: She is alert and oriented to person, place, and time.     Labs on Admission: I have personally reviewed following labs and imaging studies  CBC: Recent Labs  Lab 04/01/21 1242  WBC 4.4  NEUTROABS 2.9  HGB 9.1*  HCT 30.0*  MCV 91.2  PLT 220   Basic Metabolic Panel: Recent Labs  Lab 04/01/21 1242  NA 143  K 3.6  CL 113*  CO2 19*  GLUCOSE 105*  BUN 43*  CREATININE 2.26*  CALCIUM 8.8*   GFR: Estimated Creatinine Clearance: 31.9 mL/min (A) (by C-G formula based on SCr of 2.26 mg/dL (H)). Liver Function Tests: Recent Labs  Lab 04/01/21 1242  AST 15  ALT 13  ALKPHOS 58  BILITOT 0.7  PROT 7.0  ALBUMIN 3.9   No results for input(s): LIPASE, AMYLASE in the last 168 hours. No results for input(s): AMMONIA in the last 168 hours. Coagulation Profile: No results for input(s): INR, PROTIME in the last 168 hours. Cardiac Enzymes: No results for input(s): CKTOTAL, CKMB, CKMBINDEX, TROPONINI in the last 168 hours. BNP (last 3 results) No results for input(s): PROBNP in the last 8760 hours. HbA1C: No results for input(s): HGBA1C in the last 72 hours. CBG: No results for input(s): GLUCAP in the last 168 hours. Lipid Profile: No results for input(s): CHOL, HDL, LDLCALC, TRIG, CHOLHDL, LDLDIRECT in the last 72 hours. Thyroid Function Tests: No results for input(s): TSH, T4TOTAL, FREET4, T3FREE, THYROIDAB in the last 72 hours. Anemia Panel: No results for  input(s): VITAMINB12, FOLATE, FERRITIN, TIBC, IRON, RETICCTPCT in the last 72 hours. Urine analysis:    Component Value Date/Time   COLORURINE STRAW (A) 04/09/2019 1615   APPEARANCEUR CLOUDY (A) 04/09/2019 1615   LABSPEC 1.010 04/09/2019 1615   PHURINE 5.0 04/09/2019 1615   GLUCOSEU >=500 (A) 04/09/2019 1615   HGBUR SMALL (A) 04/09/2019 1615   HGBUR negative 06/29/2010 1356   BILIRUBINUR NEGATIVE 04/09/2019 1615   KETONESUR NEGATIVE 04/09/2019 1615   PROTEINUR 100 (A) 04/09/2019 1615   UROBILINOGEN 1.0 01/26/2015 0925   NITRITE NEGATIVE 04/09/2019 1615   LEUKOCYTESUR SMALL (A) 04/09/2019 1615    Radiological Exams on Admission: DG Chest 2 View  Result Date: 04/01/2021 CLINICAL DATA:  Dyspnea on exertion, chest pain. EXAM: CHEST - 2 VIEW COMPARISON:  February 12, 2021. FINDINGS: Stable cardiomediastinal silhouette. Both lungs are clear. The visualized skeletal structures are unremarkable. IMPRESSION: No active cardiopulmonary disease. Electronically Signed   By: Marijo Conception M.D.   On: 04/01/2021 13:17    EKG: Independently reviewed.  Sinus rhythm, no acute ischemic changes.  Assessment/Plan Principal Problem:   CHF exacerbation (HCC) Active Problems:   Insulin dependent type 2 diabetes mellitus ([place])   Hyperlipidemia   Chest pain   Acute-on-chronic kidney injury ([place])   Acute on chronic diastolic CHF -Patient presenting with complaints of dyspnea on exertion x2 weeks and 15 pound weight gain in 1 week.  Unclear what dose of Lasix she is taking at home but does report missing doses 2 weeks ago. -Echo done April 2021 showing EF 60-65% and grade 2 diastolic dysfunction. -Has peripheral edema on exam -BNP likely falsely low in the setting of morbid obesity -Chest x-ray not suggestive of pulmonary edema -Currently satting 100% on room air at rest but sats dropped to 93% with ambulation.  -IV Lasix 40 mg x 1 and repeat labs in the morning to check renal function before ordering  additional doses of Lasix. -Strict I&O, daily weights -Low-sodium diet with fluid restriction -Repeat echocardiogram -PE less likely given no tachycardia, check D-dimer level  Chest pain -Patient reports 1 episode of left-sided chest pain earlier today.  Currently chest pain-free. -ACS less likely given no acute ischemic changes on EKG and high-sensitivity troponin negative x2. -PE felt to be less likely but will check D-dimer level -Cardiac monitoring  AKI on CKD stage IIIb -BUN 43, creatinine 2.2 -Creatinine was 1.7-1.8 in July 2022 but baseline appears to be around 1.3-1.4 -Possibly cardiorenal due to decompensated CHF. -One-time dose of IV Lasix ordered for volume overload as mentioned above, repeat BMP in a.m. to check renal function. -Avoid nephrotoxic agents  Mild normal anion gap metabolic acidosis -Likely due to AKI -Repeat BMP in a.m.  COPD -Acute exacerbation felt to be less likely as patient is not wheezing. -Continue home inhalers  Hypertension -Blood pressure currently stable -Continue home amlodipine, hydralazine, and labetalol  Hyperlipidemia -Continue Lipitor  Insulin-dependent diabetes -A1c 7.6 on 12/05/2020 -Continue home Lantus 60 units at bedtime -Sliding scale insulin sensitive  Diabetic neuropathy -Continue Lyrica  Chronic anemia -Stable, hemoglobin not significantly changed from baseline  DVT prophylaxis: Subcutaneous heparin Code Status: Full code Family Communication: No family available at this time. Disposition Plan: Status is: Observation  The patient remains OBS appropriate and will d/c before 2 midnights.  Dispo: The patient is from: Home              Anticipated d/c is to: Home              Patient currently is not medically stable to d/c.   Difficult to place patient No  Level of care: Level of care: Telemetry Cardiac  The medical decision making on this patient was of high complexity and the patient is at high risk for  clinical deterioration, therefore this is a level 3 visit.  Shela Leff MD [facility]  If 7PM-7AM, please contact night-coverage [email or web address]  04/02/2021, 2:34 AM

## 2021-04-03 DIAGNOSIS — I5031 Acute diastolic (congestive) heart failure: Secondary | ICD-10-CM

## 2021-04-03 DIAGNOSIS — I5033 Acute on chronic diastolic (congestive) heart failure: Secondary | ICD-10-CM | POA: Diagnosis not present

## 2021-04-03 LAB — GLUCOSE, CAPILLARY
Glucose-Capillary: 164 mg/dL — ABNORMAL HIGH (ref 70–99)
Glucose-Capillary: 157 mg/dL — ABNORMAL HIGH (ref 70–99)
Glucose-Capillary: 123 mg/dL — ABNORMAL HIGH (ref 70–99)
Glucose-Capillary: 194 mg/dL — ABNORMAL HIGH (ref 70–99)
Glucose-Capillary: 184 mg/dL — ABNORMAL HIGH (ref 70–99)

## 2021-04-03 LAB — BASIC METABOLIC PANEL
Anion gap: 12 (ref 5–15)
BUN: 44 mg/dL — ABNORMAL HIGH (ref 6–20)
CO2: 24 mmol/L (ref 22–32)
Calcium: 8.8 mg/dL — ABNORMAL LOW (ref 8.9–10.3)
Chloride: 105 mmol/L (ref 98–111)
Creatinine, Ser: 2.29 mg/dL — ABNORMAL HIGH (ref 0.44–1.00)
GFR, Estimated: 24 mL/min — ABNORMAL LOW (ref >60)
Glucose, Bld: 150 mg/dL — ABNORMAL HIGH (ref 70–99)
Potassium: 3.3 mmol/L — ABNORMAL LOW (ref 3.5–5.1)
Sodium: 141 mmol/L (ref 135–145)

## 2021-04-03 LAB — CBC
HCT: 26.8 % — ABNORMAL LOW (ref 36.0–46.0)
Hemoglobin: 8.4 g/dL — ABNORMAL LOW (ref 12.0–15.0)
MCH: 27.9 pg (ref 26.0–34.0)
MCHC: 31.3 g/dL (ref 30.0–36.0)
MCV: 89 fL (ref 80.0–100.0)
Platelets: 146 10*3/uL — ABNORMAL LOW (ref 150–400)
RBC: 3.01 MIL/uL — ABNORMAL LOW (ref 3.87–5.11)
RDW: 14.1 % (ref 11.5–15.5)
WBC: 3.5 10*3/uL — ABNORMAL LOW (ref 4.0–10.5)
nRBC: 0 % (ref 0.0–0.2)

## 2021-04-03 MED ORDER — POTASSIUM CHLORIDE CRYS ER 20 MEQ PO TBCR
40.0000 meq | EXTENDED_RELEASE_TABLET | Freq: Every day | ORAL | Status: DC
  Administered 2021-04-04: 40 meq via ORAL
  Filled 2021-04-03: qty 2

## 2021-04-03 MED ORDER — POTASSIUM CHLORIDE CRYS ER 20 MEQ PO TBCR
20.0000 meq | EXTENDED_RELEASE_TABLET | Freq: Once | ORAL | Status: AC
  Administered 2021-04-03: 20 meq via ORAL
  Filled 2021-04-03: qty 1

## 2021-04-03 MED ORDER — POTASSIUM CHLORIDE CRYS ER 20 MEQ PO TBCR
20.0000 meq | EXTENDED_RELEASE_TABLET | Freq: Every day | ORAL | Status: DC
  Administered 2021-04-03: 20 meq via ORAL
  Filled 2021-04-03: qty 1

## 2021-04-03 NOTE — Progress Notes (Signed)
PROGRESS NOTE    [name] [name]  DPO:[id number] DOB: 1960/09/20 DOA: 04/01/2021 PCP: [name], MD   Chief Complaint  Patient presents with   Shortness of Breath   Brief Narrative: 60 year old 60 year old female with chronic diastolic CHF, COPD, morbid obesity BMI 49, GERD, HTN, IDDM, CKD stage IIIb, chronic anemia, chronic hepatitis C seen at PCP office for 15 pound weight gain in 1 week, progressively worsening dyspnea on exertion despite taking oral diuretics at home sent to the ED for further evaluation. In the ED labs stable with BUN 43/creatinine 2.2 previously 1.7-1.8 in July 2022, chest x-ray no active cardiopulmonary disease, given her presentation admitted for acute on chronic diastolic CHF, also had 1 episode of left-sided chest pain in the morning of day of admission but no recurrence.  Subjective:  On bedside chair, BF at bedside Swelling improving. No acute events overnight.Patient on 2 L nasal cannula. Diuresing with improvement down to 116 kg.  Assessment & Plan: Acute on chronic diastolic CHF: Presented with 15 pound weight gain in a week and progressive dyspnea x2 weeks despite taking oral Lasix.  Previous weight on 03/26/21 112.5 kg on admission 119 kg.  recent echo April 2021 EF 60-65% G2 DD.  On admission peripheral edema BNP low likely  falsely from morbid obesity chest x-ray no pulmonary edema,repeat echo -LVEF preserved 65-70%, moderately elevated pulmonary artery systolic pressure.D-dimer was negative.Diuresing and weight is downtrending.Continue on low-salt diet 2 gm,  1200 ml fluid restriction  and monitor intake output Daily weight.Continue current IV diuresis as per cardiology with close monitoring of renal function.GDMT-changed labetalol to Coreg, continued on hydralazine add Imdur if BP allows, can add ACE inhibitors as she is allergic to lisinopril. Net IO Since Admission: -1,600 mL [04/03/21 1032]  Filed Weights   04/01/21 1237 04/02/21 1502 04/03/21 0447   Weight: 119 kg 117.7 kg 116.6 kg    Intake/Output Summary (Last 24 hours) at 04/03/2021 1032 Last data filed at 04/03/2021 0855 Gross per 24 hour  Intake --  Output 1600 ml  Net -1600 ml    Chest pain x1 negative D-dimer no acute ischemia suspected high-sensitivity troponin negative x2  AKI on CKD stage IIIb Normal anion gap metabolic acidosis due to CKD :BUN 43/creatinine 2.2 previously 1.7-1.8 in July 2022.  Monitor closely while on Lasix, will need to let it run slightly higher side to allow diuresis Recent Labs  Lab 04/01/21 1242 04/02/21 0846 04/03/21 0241  BUN 43* 39* 44*  CREATININE 2.26* 2.17* 2.29*   Mild hypokalemia add 20 KCl daily while on Lasix  COPD-on Pulmicort nebulizer and bronchodilators.  Diminished breath sounds.  Continue same Chronic respiratory failure on home oxygen but recently the canister were off.  Continue supplemental oxygen.  Hypertension-well-controlled,adjusting meds for CH,on amlodipine hydralazine coreg.  HLD on Lipitor  IDDM with diabetic neuropathy, HbA1c 7.6 12/05/2020, at home on 60 Lantus bedtime.  Continue same and sliding scale.  Continue Lyrica for neuropathy.  Itarget npatient blood sugargoal fasting less than 140 postprandial less than 180 Recent Labs  Lab 04/02/21 0825 04/02/21 1145 04/02/21 1650 04/02/21 2025 04/03/21 0537  GLUCAP 107* 139* 141* 139* 164*     Chronic anemia likely from chronic disease-stable Recent Labs  Lab 04/01/21 1242 04/02/21 1200 04/03/21 0241  HGB 9.1* 8.9* 8.4*  HCT 30.0* 29.2* 26.8*     Morbid obesity BMI 49>44: Will benefit with weight loss, PCP follow-up and sleep apnea evaluation as outpatient  Diet Order  Diet heart healthy/carb modified Room service appropriate? Yes; Fluid consistency: Thin; Fluid restriction: 1200 mL Fluid  Diet effective now                   Patient's Body mass index is 44.12 kg/m. DVT prophylaxis: Place TED hose Start: 04/02/21 1531 heparin  injection 5,000 Units Start: 04/02/21 0600 Code Status:   Code Status: Full Code  Family Communication: plan of care discussed with patient at bedside. Status is: Inpatient Remains inpatient appropriate because:IV treatments appropriate due to intensity of illness or inability to take PO and Inpatient level of care appropriate due to severity of illness  Disp: The patient is from: Home              Anticipated d/c is to: Home.  PT OT ambulation              Patient currently is not medically stable to d/c.   Difficult to place patient No    Unresulted Labs (From admission, onward)     Start     Ordered   04/03/21 9476  Basic metabolic panel  Daily,   R     Question:  Specimen collection method  Answer:  Lab=Lab collect   04/02/21 0831   04/03/21 0500  CBC  Daily,   R     Question:  Specimen collection method  Answer:  Lab=Lab collect   04/02/21 0831   04/02/21 1141  Respiratory (~20 pathogens) panel by PCR  (Respiratory panel by PCR (~20 pathogens, ~24 hr TAT)  w precautions)  Once,   STAT        04/02/21 1141           Medications reviewed:  Scheduled Meds:  amLODipine  10 mg Oral Daily   atorvastatin  40 mg Oral q morning   budesonide  1 mg Inhalation BID   carvedilol  6.25 mg Oral BID WC   ferrous sulfate  325 mg Oral Q breakfast   furosemide  80 mg Intravenous BID   heparin  5,000 Units Subcutaneous Q8H   hydrALAZINE  50 mg Oral TID   insulin aspart  0-5 Units Subcutaneous QHS   insulin aspart  0-9 Units Subcutaneous TID WC   insulin glargine-yfgn  60 Units Subcutaneous QHS   potassium chloride  20 mEq Oral Once   [START ON 04/04/2021] potassium chloride  40 mEq Oral Daily   pregabalin  75 mg Oral BID   Continuous Infusions: Consultants:see note  Procedures:see note Antimicrobials: Anti-infectives (From admission, onward)    None      Culture/Microbiology    Component Value Date/Time   SDES URINE, RANDOM 09/24/2015 2112   [place] NONE 09/24/2015 2112    CULT  09/24/2015 2112    MULTIPLE SPECIES PRESENT, SUGGEST RECOLLECTION Performed at [facility] 09/26/2015 FINAL 09/24/2015 2112    Other culture-see note  Objective: Vitals: Today's Vitals   04/02/21 1502 04/02/21 1950 04/02/21 1958 04/03/21 0447  BP: 140/72 (!) 138/92  138/75  Pulse: 80 84  82  Resp: 20 20  20   Temp: 97.7 F (36.5 C) (!) 97.5 F (36.4 C)  98.3 F (36.8 C)  TempSrc: Oral   Oral  SpO2: 99% 96% 96% 100%  Weight: 117.7 kg   116.6 kg  Height: 5\' 4"  (1.626 m)     PainSc: 0-No pain       Intake/Output Summary (Last 24 hours) at 04/03/2021 1032 Last data filed at  04/03/2021 0855 Gross per 24 hour  Intake --  Output 1600 ml  Net -1600 ml   Filed Weights   04/01/21 1237 04/02/21 1502 04/03/21 0447  Weight: 119 kg 117.7 kg 116.6 kg   Weight change: -1.3 kg  Intake/Output from previous day: 09/15 0701 - 09/16 0700 In: -  Out: 1000 [Urine:1000] Intake/Output this shift: Total I/O In: -  Out: 600 [Urine:600] Filed Weights   04/01/21 1237 04/02/21 1502 04/03/21 0447  Weight: 119 kg 117.7 kg 116.6 kg   Examination: General exam: AAO x3.pleasant ,Obese. HEENT:Oral mucosa moist, Ear/Nose WNL grossly,dentition normal. Respiratory system: bilaterally diminished, no use of accessory muscle, non tender. Cardiovascular system: S1 & S2 +,No JVD. Gastrointestinal system: Abdomen soft, NT,ND, BS+. Nervous System:Alert, awake, moving extremities Extremities: Pitting edema 2+ ankle , distal peripheral pulses palpable.  Skin: No rashes,no icterus. MSK: Normal muscle bulk,tone, power  Data Reviewed: I have personally reviewed following labs and imaging studies CBC: Recent Labs  Lab 04/01/21 1242 04/02/21 1200 04/03/21 0241  WBC 4.4 3.8* 3.5*  NEUTROABS 2.9 2.3  --   HGB 9.1* 8.9* 8.4*  HCT 30.0* 29.2* 26.8*  MCV 91.2 90.7 89.0  PLT 153 146* 671*   Basic Metabolic Panel: Recent Labs  Lab 04/01/21 1242 04/02/21 0846  04/02/21 1200 04/03/21 0241  NA 143 141  --  141  K 3.6 3.8  --  3.3*  CL 113* 110  --  105  CO2 19* 20*  --  24  GLUCOSE 105* 137*  --  150*  BUN 43* 39*  --  44*  CREATININE 2.26* 2.17*  --  2.29*  CALCIUM 8.8* 8.7*  --  8.8*  MG  --   --  1.8  --    GFR: Estimated Creatinine Clearance: 32.8 mL/min (A) (by C-G formula based on SCr of 2.29 mg/dL (H)). Liver Function Tests: Recent Labs  Lab 04/01/21 1242  AST 15  ALT 13  ALKPHOS 58  BILITOT 0.7  PROT 7.0  ALBUMIN 3.9   No results for input(s): LIPASE, AMYLASE in the last 168 hours. No results for input(s): AMMONIA in the last 168 hours. Coagulation Profile: No results for input(s): INR, PROTIME in the last 168 hours. Cardiac Enzymes: No results for input(s): CKTOTAL, CKMB, CKMBINDEX, TROPONINI in the last 168 hours. BNP (last 3 results) No results for input(s): PROBNP in the last 8760 hours. HbA1C: Recent Labs    04/02/21 0243  HGBA1C 6.0*   CBG: Recent Labs  Lab 04/02/21 0825 04/02/21 1145 04/02/21 1650 04/02/21 2025 04/03/21 0537  GLUCAP 107* 139* 141* 139* 164*   Lipid Profile: No results for input(s): CHOL, HDL, LDLCALC, TRIG, CHOLHDL, LDLDIRECT in the last 72 hours. Thyroid Function Tests: No results for input(s): TSH, T4TOTAL, FREET4, T3FREE, THYROIDAB in the last 72 hours. Anemia Panel: No results for input(s): VITAMINB12, FOLATE, FERRITIN, TIBC, IRON, RETICCTPCT in the last 72 hours. Sepsis Labs: Recent Labs  Lab 04/02/21 1200  PROCALCITON <0.10    Recent Results (from the past 240 hour(s))  Resp Panel by RT-PCR (Flu A&B, Covid) Nasopharyngeal Swab     Status: None   Collection Time: 04/01/21 12:38 PM   Specimen: Nasopharyngeal Swab; Nasopharyngeal(NP) swabs in vial transport medium  Result Value Ref Range Status   SARS Coronavirus 2 by RT PCR NEGATIVE NEGATIVE Final    Comment: (NOTE) SARS-CoV-2 target nucleic acids are NOT DETECTED.  The SARS-CoV-2 RNA is generally detectable in upper  respiratory specimens during the acute phase  of infection. The lowest concentration of SARS-CoV-2 viral copies this assay can detect is 138 copies/mL. A negative result does not preclude SARS-Cov-2 infection and should not be used as the sole basis for treatment or other patient management decisions. A negative result may occur with  improper specimen collection/handling, submission of specimen other than nasopharyngeal swab, presence of viral mutation(s) within the areas targeted by this assay, and inadequate number of viral copies(<138 copies/mL). A negative result must be combined with clinical observations, patient history, and epidemiological information. The expected result is Negative.  Fact Sheet for Patients:  EntrepreneurPulse.com.au  Fact Sheet for Healthcare Providers:  IncredibleEmployment.be  This test is no t yet approved or cleared by the [place] FDA and  has been authorized for detection and/or diagnosis of SARS-CoV-2 by FDA under an Emergency Use Authorization (EUA). This EUA will remain  in effect (meaning this test can be used) for the duration of the COVID-19 declaration under Section 564(b)(1) of the Act, 21 U.S.C.section 360bbb-3(b)(1), unless the authorization is terminated  or revoked sooner.       Influenza A by PCR NEGATIVE NEGATIVE Final   Influenza B by PCR NEGATIVE NEGATIVE Final    Comment: (NOTE) The Xpert Xpress SARS-CoV-2/FLU/RSV plus assay is intended as an aid in the diagnosis of influenza from Nasopharyngeal swab specimens and should not be used as a sole basis for treatment. Nasal washings and aspirates are unacceptable for Xpert Xpress SARS-CoV-2/FLU/RSV testing.  Fact Sheet for Patients: EntrepreneurPulse.com.au  Fact Sheet for Healthcare Providers: IncredibleEmployment.be  This test is not yet approved or cleared by the [place] FDA and has been  authorized for detection and/or diagnosis of SARS-CoV-2 by FDA under an Emergency Use Authorization (EUA). This EUA will remain in effect (meaning this test can be used) for the duration of the COVID-19 declaration under Section 564(b)(1) of the Act, 21 U.S.C. section 360bbb-3(b)(1), unless the authorization is terminated or revoked.  Performed at [facility], [place] [address] [place], [place] [address]      Radiology Studies: DG Chest 2 View  Result Date: 04/01/2021 CLINICAL DATA:  Dyspnea on exertion, chest pain. EXAM: CHEST - 2 VIEW COMPARISON:  February 12, 2021. FINDINGS: Stable cardiomediastinal silhouette. Both lungs are clear. The visualized skeletal structures are unremarkable. IMPRESSION: No active cardiopulmonary disease. Electronically Signed   By: Marijo Conception M.D.   On: 04/01/2021 13:17   ECHOCARDIOGRAM COMPLETE  Result Date: 04/02/2021    ECHOCARDIOGRAM REPORT   Patient Name:   [name] [name] Date of Exam: 04/02/2021 Medical Rec #:  [id number]      Height:       61.0 in Accession #:    [id number]     Weight:       262.3 lb Date of Birth:  14-Aug-1960      BSA:          2.120 m Patient Age:    41 years       BP:           156/84 mmHg Patient Gender: F              HR:           83 bpm. Exam Location:  Inpatient Procedure: 2D Echo, Cardiac Doppler and Color Doppler Indications:    I50.30* Unspecified diastolic (congestive) heart failure  History:        Patient has prior history of Echocardiogram examinations, most  recent 11/07/2019. CHF, Signs/Symptoms:Chest Pain; Risk                 Factors:Diabetes.  Sonographer:    MH Referring Phys: [id number] [place]  1. Left ventricular ejection fraction, by estimation, is 65 to 70%. The left ventricle has normal function. The left ventricle has no regional wall motion abnormalities. Left ventricular diastolic parameters are indeterminate.  2. Right ventricular systolic function is normal. The right  ventricular size is normal. There is moderately elevated pulmonary artery systolic pressure.  3. The mitral valve is normal in structure. Trivial mitral valve regurgitation.  4. Tricuspid valve regurgitation is mild to moderate.  5. The aortic valve is normal in structure. Aortic valve regurgitation is not visualized.  6. The inferior vena cava is normal in size with greater than 50% respiratory variability, suggesting right atrial pressure of 3 mmHg. FINDINGS  Left Ventricle: Left ventricular ejection fraction, by estimation, is 65 to 70%. The left ventricle has normal function. The left ventricle has no regional wall motion abnormalities. The left ventricular internal cavity size was normal in size. There is  no left ventricular hypertrophy. Left ventricular diastolic parameters are indeterminate. Right Ventricle: The right ventricular size is normal. Right vetricular wall thickness was not assessed. Right ventricular systolic function is normal. There is moderately elevated pulmonary artery systolic pressure. The tricuspid regurgitant velocity is  3.28 m/s, and with an assumed right atrial pressure of 3 mmHg, the estimated right ventricular systolic pressure is 24.5 mmHg. Left Atrium: Left atrial size was normal in size. Right Atrium: Right atrial size was normal in size. Pericardium: Trivial pericardial effusion is present. Mitral Valve: The mitral valve is normal in structure. Trivial mitral valve regurgitation. Tricuspid Valve: The tricuspid valve is normal in structure. Tricuspid valve regurgitation is mild to moderate. Aortic Valve: The aortic valve is normal in structure. Aortic valve regurgitation is not visualized. Aortic regurgitation PHT measures 855 msec. Aortic valve mean gradient measures 8.0 mmHg. Aortic valve peak gradient measures 14.7 mmHg. Aortic valve area, by VTI measures 2.85 cm. Pulmonic Valve: The pulmonic valve was not well visualized. Pulmonic valve regurgitation is trivial. Aorta: The  aortic root is normal in size and structure. Venous: The inferior vena cava is normal in size with greater than 50% respiratory variability, suggesting right atrial pressure of 3 mmHg. IAS/Shunts: The interatrial septum was not assessed.  LEFT VENTRICLE PLAX 2D LVIDd:         3.80 cm     Diastology LVIDs:         2.50 cm     LV e' medial:    5.11 cm/s LV PW:         1.30 cm     LV E/e' medial:  24.1 LV IVS:        1.40 cm     LV e' lateral:   8.70 cm/s LVOT diam:     2.40 cm     LV E/e' lateral: 14.1 LV SV:         131 LV SV Index:   62 LVOT Area:     4.52 cm  LV Volumes (MOD) LV vol d, MOD A4C: 49.3 ml LV vol s, MOD A4C: 16.8 ml LV SV MOD A4C:     49.3 ml RIGHT VENTRICLE             IVC RV S prime:     13.90 cm/s  IVC diam: 2.10 cm TAPSE (M-mode): 2.6 cm LEFT ATRIUM  Index       RIGHT ATRIUM          Index LA diam:      3.20 cm 1.51 cm/m  RA Area:     7.27 cm LA Vol (A2C): 34.6 ml 16.32 ml/m RA Volume:   11.00 ml 5.19 ml/m LA Vol (A4C): 47.3 ml 22.32 ml/m  AORTIC VALVE                    PULMONIC VALVE AV Area (Vmax):    3.04 cm     PV Vmax:       1.11 m/s AV Area (Vmean):   2.84 cm     PV Peak grad:  4.9 mmHg AV Area (VTI):     2.85 cm AV Vmax:           192.00 cm/s AV Vmean:          139.000 cm/s AV VTI:            0.458 m AV Peak Grad:      14.7 mmHg AV Mean Grad:      8.0 mmHg LVOT Vmax:         129.00 cm/s LVOT Vmean:        87.400 cm/s LVOT VTI:          0.289 m LVOT/AV VTI ratio: 0.63 AI PHT:            855 msec  AORTA Ao Root diam: 2.80 cm Ao Asc diam:  3.20 cm MITRAL VALVE                TRICUSPID VALVE MV Area (PHT): 2.59 cm     TR Peak grad:   43.0 mmHg MV E velocity: 123.00 cm/s  TR Vmax:        328.00 cm/s MV A velocity: 141.00 cm/s MV E/A ratio:  0.87         SHUNTS                             Systemic VTI:  0.29 m                             Systemic Diam: 2.40 cm Dorris Carnes MD Electronically signed by Dorris Carnes MD Signature Date/Time: 04/02/2021/12:41:56 PM    Final      LOS: 1  day   [name], MD [facility]  04/03/2021, 10:32 AM

## 2021-04-03 NOTE — Progress Notes (Signed)
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   Qualifier: Diagnosis of  By: Jorene Minors, [place]     Hypertension    MRSA (methicillin resistant Staphylococcus aureus)    Trichomonas    VAGINITIS, BACTERIAL, RECURRENT 08/15/2007   Qualifier: Diagnosis of  By: Radene Ou MD, [place]      Social History   Socioeconomic History   Marital status: Legally Separated    Spouse name: Not on file   Number of children: 1   Years of education: Not on file   Highest education level: 9th grade  Occupational History   Occupation: disability  Tobacco Use   Smoking status: Never   Smokeless tobacco: Never  Vaping Use   Vaping Use: Never used  Substance and Sexual Activity   Alcohol use: No   Drug use: Not Currently    Types: Cocaine, Marijuana    Comment: remote h/o cocaine 2015 and marijuana 2018   Sexual activity: Not Currently  Other Topics Concern   Not on file  Social History Narrative   Lives with friend, Rosalee Kaufman and nephew.  One daughter.    Social Determinants of Health   Financial Resource Strain: Low Risk    Difficulty of Paying Living Expenses: Not very hard  Food Insecurity: No Food Insecurity   Worried About Charity fundraiser in the Last Year: Never true   Arboriculturist in the Last Year: Never true  Transportation Needs: Unmet Transportation Needs   Lack of Transportation (Medical): No   Lack of Transportation (Non-Medical): Yes  Physical Activity: Not on file  Stress: Not on file  Social Connections: Not on file     Heart & Vascular Transition of Care Clinic follow-up: Scheduled for 9/26 @ 3pm--pending renal improvement..  Confirmed patient enrolled in  [facility] transportation.  Immediate social needs: Transportation  CSW enrolled the patient in [employer] [employer] services.  Jaymarion Trombly, MSW, [place] Heart Failure Social Worker

## 2021-04-03 NOTE — Plan of Care (Signed)

## 2021-04-03 NOTE — Progress Notes (Signed)
Progress Note  Patient Name: [name] [name] Date of Encounter: 04/03/2021  Primary Cardiologist:   Minus Breeding, MD   Subjective   She feels less SOB.  No pain.    Inpatient Medications    Scheduled Meds:  amLODipine  10 mg Oral Daily   atorvastatin  40 mg Oral q morning   budesonide  1 mg Inhalation BID   carvedilol  6.25 mg Oral BID WC   ferrous sulfate  325 mg Oral Q breakfast   furosemide  80 mg Intravenous BID   heparin  5,000 Units Subcutaneous Q8H   hydrALAZINE  50 mg Oral TID   insulin aspart  0-5 Units Subcutaneous QHS   insulin aspart  0-9 Units Subcutaneous TID WC   insulin glargine-yfgn  60 Units Subcutaneous QHS   potassium chloride  20 mEq Oral Daily   pregabalin  75 mg Oral BID   Continuous Infusions:  PRN Meds: acetaminophen **OR** acetaminophen, albuterol   Vital Signs    Vitals:   04/02/21 1502 04/02/21 1950 04/02/21 1958 04/03/21 0447  BP: 140/72 (!) 138/92  138/75  Pulse: 80 84  82  Resp: 20 20  20   Temp: 97.7 F (36.5 C) (!) 97.5 F (36.4 C)  98.3 F (36.8 C)  TempSrc: Oral   Oral  SpO2: 99% 96% 96% 100%  Weight: 117.7 kg   116.6 kg  Height: 5\' 4"  (1.626 m)       Intake/Output Summary (Last 24 hours) at 04/03/2021 0953 Last data filed at 04/03/2021 0855 Gross per 24 hour  Intake --  Output 1600 ml  Net -1600 ml   Filed Weights   04/01/21 1237 04/02/21 1502 04/03/21 0447  Weight: 119 kg 117.7 kg 116.6 kg    Telemetry    NSR - Personally Reviewed  ECG    NA - Personally Reviewed  Physical Exam   GEN: No acute distress.   Neck: No  JVD Cardiac: RRR, no murmurs, rubs, or gallops.  Respiratory: Clear  to auscultation bilaterally. GI: Soft, nontender, non-distended  MS:     Moderate leg edema; No deformity. Neuro:  Nonfocal  Psych: Normal affect   Labs    Chemistry Recent Labs  Lab 04/01/21 1242 04/02/21 0846 04/03/21 0241  NA 143 141 141  K 3.6 3.8 3.3*  CL 113* 110 105  CO2 19* 20* 24  GLUCOSE 105* 137*  150*  BUN 43* 39* 44*  CREATININE 2.26* 2.17* 2.29*  CALCIUM 8.8* 8.7* 8.8*  PROT 7.0  --   --   ALBUMIN 3.9  --   --   AST 15  --   --   ALT 13  --   --   ALKPHOS 58  --   --   BILITOT 0.7  --   --   GFRNONAA 24* 25* 24*  ANIONGAP 11 11 12      Hematology Recent Labs  Lab 04/01/21 1242 04/02/21 1200 04/03/21 0241  WBC 4.4 3.8* 3.5*  RBC 3.29* 3.22* 3.01*  HGB 9.1* 8.9* 8.4*  HCT 30.0* 29.2* 26.8*  MCV 91.2 90.7 89.0  MCH 27.7 27.6 27.9  MCHC 30.3 30.5 31.3  RDW 14.2 14.0 14.1  PLT 153 146* 146*    Cardiac EnzymesNo results for input(s): TROPONINI in the last 168 hours. No results for input(s): TROPIPOC in the last 168 hours.   BNP Recent Labs  Lab 04/01/21 1242  BNP 92.8     DDimer  Recent Labs  Lab 04/02/21 0243  DDIMER 0.46     Radiology    DG Chest 2 View  Result Date: 04/01/2021 CLINICAL DATA:  Dyspnea on exertion, chest pain. EXAM: CHEST - 2 VIEW COMPARISON:  February 12, 2021. FINDINGS: Stable cardiomediastinal silhouette. Both lungs are clear. The visualized skeletal structures are unremarkable. IMPRESSION: No active cardiopulmonary disease. Electronically Signed   By: Marijo Conception M.D.   On: 04/01/2021 13:17   ECHOCARDIOGRAM COMPLETE  Result Date: 04/02/2021    ECHOCARDIOGRAM REPORT   Patient Name:   [name] [name] Date of Exam: 04/02/2021 Medical Rec #:  [id number]      Height:       61.0 in Accession #:    [id number]     Weight:       262.3 lb Date of Birth:  June 21, 1961      BSA:          2.120 m Patient Age:    7 years       BP:           156/84 mmHg Patient Gender: F              HR:           83 bpm. Exam Location:  Inpatient Procedure: 2D Echo, Cardiac Doppler and Color Doppler Indications:    I50.30* Unspecified diastolic (congestive) heart failure  History:        Patient has prior history of Echocardiogram examinations, most                 recent 11/07/2019. CHF, Signs/Symptoms:Chest Pain; Risk                 Factors:Diabetes.  Sonographer:    MH  Referring Phys: [id number] [place]  1. Left ventricular ejection fraction, by estimation, is 65 to 70%. The left ventricle has normal function. The left ventricle has no regional wall motion abnormalities. Left ventricular diastolic parameters are indeterminate.  2. Right ventricular systolic function is normal. The right ventricular size is normal. There is moderately elevated pulmonary artery systolic pressure.  3. The mitral valve is normal in structure. Trivial mitral valve regurgitation.  4. Tricuspid valve regurgitation is mild to moderate.  5. The aortic valve is normal in structure. Aortic valve regurgitation is not visualized.  6. The inferior vena cava is normal in size with greater than 50% respiratory variability, suggesting right atrial pressure of 3 mmHg. FINDINGS  Left Ventricle: Left ventricular ejection fraction, by estimation, is 65 to 70%. The left ventricle has normal function. The left ventricle has no regional wall motion abnormalities. The left ventricular internal cavity size was normal in size. There is  no left ventricular hypertrophy. Left ventricular diastolic parameters are indeterminate. Right Ventricle: The right ventricular size is normal. Right vetricular wall thickness was not assessed. Right ventricular systolic function is normal. There is moderately elevated pulmonary artery systolic pressure. The tricuspid regurgitant velocity is  3.28 m/s, and with an assumed right atrial pressure of 3 mmHg, the estimated right ventricular systolic pressure is 75.1 mmHg. Left Atrium: Left atrial size was normal in size. Right Atrium: Right atrial size was normal in size. Pericardium: Trivial pericardial effusion is present. Mitral Valve: The mitral valve is normal in structure. Trivial mitral valve regurgitation. Tricuspid Valve: The tricuspid valve is normal in structure. Tricuspid valve regurgitation is mild to moderate. Aortic Valve: The aortic valve is normal in  structure. Aortic valve regurgitation is not visualized. Aortic regurgitation PHT measures 855 msec. Aortic  valve mean gradient measures 8.0 mmHg. Aortic valve peak gradient measures 14.7 mmHg. Aortic valve area, by VTI measures 2.85 cm. Pulmonic Valve: The pulmonic valve was not well visualized. Pulmonic valve regurgitation is trivial. Aorta: The aortic root is normal in size and structure. Venous: The inferior vena cava is normal in size with greater than 50% respiratory variability, suggesting right atrial pressure of 3 mmHg. IAS/Shunts: The interatrial septum was not assessed.  LEFT VENTRICLE PLAX 2D LVIDd:         3.80 cm     Diastology LVIDs:         2.50 cm     LV e' medial:    5.11 cm/s LV PW:         1.30 cm     LV E/e' medial:  24.1 LV IVS:        1.40 cm     LV e' lateral:   8.70 cm/s LVOT diam:     2.40 cm     LV E/e' lateral: 14.1 LV SV:         131 LV SV Index:   62 LVOT Area:     4.52 cm  LV Volumes (MOD) LV vol d, MOD A4C: 49.3 ml LV vol s, MOD A4C: 16.8 ml LV SV MOD A4C:     49.3 ml RIGHT VENTRICLE             IVC RV S prime:     13.90 cm/s  IVC diam: 2.10 cm TAPSE (M-mode): 2.6 cm LEFT ATRIUM           Index       RIGHT ATRIUM          Index LA diam:      3.20 cm 1.51 cm/m  RA Area:     7.27 cm LA Vol (A2C): 34.6 ml 16.32 ml/m RA Volume:   11.00 ml 5.19 ml/m LA Vol (A4C): 47.3 ml 22.32 ml/m  AORTIC VALVE                    PULMONIC VALVE AV Area (Vmax):    3.04 cm     PV Vmax:       1.11 m/s AV Area (Vmean):   2.84 cm     PV Peak grad:  4.9 mmHg AV Area (VTI):     2.85 cm AV Vmax:           192.00 cm/s AV Vmean:          139.000 cm/s AV VTI:            0.458 m AV Peak Grad:      14.7 mmHg AV Mean Grad:      8.0 mmHg LVOT Vmax:         129.00 cm/s LVOT Vmean:        87.400 cm/s LVOT VTI:          0.289 m LVOT/AV VTI ratio: 0.63 AI PHT:            855 msec  AORTA Ao Root diam: 2.80 cm Ao Asc diam:  3.20 cm MITRAL VALVE                TRICUSPID VALVE MV Area (PHT): 2.59 cm     TR Peak grad:    43.0 mmHg MV E velocity: 123.00 cm/s  TR Vmax:        328.00 cm/s MV A velocity: 141.00 cm/s MV E/A ratio:  0.87         SHUNTS                             Systemic VTI:  0.29 m                             Systemic Diam: 2.40 cm Dorris Carnes MD Electronically signed by Dorris Carnes MD Signature Date/Time: 04/02/2021/12:41:56 PM    Final     Cardiac Studies   Echo:  1. Left ventricular ejection fraction, by estimation, is 65 to 70%. The  left ventricle has normal function. The left ventricle has no regional  wall motion abnormalities. Left ventricular diastolic parameters are  indeterminate.   2. Right ventricular systolic function is normal. The right ventricular  size is normal. There is moderately elevated pulmonary artery systolic  pressure.   3. The mitral valve is normal in structure. Trivial mitral valve  regurgitation.   4. Tricuspid valve regurgitation is mild to moderate.   5. The aortic valve is normal in structure. Aortic valve regurgitation is  not visualized.   6. The inferior vena cava is normal in size with greater than 50%  respiratory variability, suggesting right atrial pressure of 3 mmHg.   Patient Profile     59 y.o. female with a hx of chronic diastolic heart failure, hypertension, type 2 diabetes, CKD stage IIIb, chronic iron deficiency anemia, chronic hepatitis C, morbid obesity with BMI 49.57, OSA, who is being seen 04/02/2021 for the evaluation of decompensated heart failure at the request of Dr. Lupita Leash.  Assessment & Plan     Acute on chronic diastolic heart failure:  Intake and output net negative 1 liter.    Despite the increased creat slightly I think she needs to continue on the current dose of diuretic.  Still very congested.   AKI on CKD IIIb:    Creat is up slightly.   Follow closely.   HTN:   continue current therapy.     HLD:    Continue statin.    Hypokalemia:  Given potassium today.  I will increase the dose while she is being diuresed.     For  questions or updates, please contact [place] Please consult [email or web address] for contact info under Cardiology/STEMI.   Signed, Minus Breeding, MD  04/03/2021, 9:53 AM

## 2021-04-04 DIAGNOSIS — I5031 Acute diastolic (congestive) heart failure: Secondary | ICD-10-CM | POA: Diagnosis not present

## 2021-04-04 DIAGNOSIS — I5033 Acute on chronic diastolic (congestive) heart failure: Secondary | ICD-10-CM | POA: Diagnosis not present

## 2021-04-04 LAB — CBC
HCT: 27.5 % — ABNORMAL LOW (ref 36.0–46.0)
Hemoglobin: 8.6 g/dL — ABNORMAL LOW (ref 12.0–15.0)
MCH: 27.5 pg (ref 26.0–34.0)
MCHC: 31.3 g/dL (ref 30.0–36.0)
MCV: 87.9 fL (ref 80.0–100.0)
Platelets: 145 10*3/uL — ABNORMAL LOW (ref 150–400)
RBC: 3.13 MIL/uL — ABNORMAL LOW (ref 3.87–5.11)
RDW: 14.2 % (ref 11.5–15.5)
WBC: 3.9 10*3/uL — ABNORMAL LOW (ref 4.0–10.5)
nRBC: 0 % (ref 0.0–0.2)

## 2021-04-04 LAB — BASIC METABOLIC PANEL
Anion gap: 10 (ref 5–15)
BUN: 46 mg/dL — ABNORMAL HIGH (ref 6–20)
CO2: 24 mmol/L (ref 22–32)
Calcium: 8.8 mg/dL — ABNORMAL LOW (ref 8.9–10.3)
Chloride: 106 mmol/L (ref 98–111)
Creatinine, Ser: 2.12 mg/dL — ABNORMAL HIGH (ref 0.44–1.00)
GFR, Estimated: 26 mL/min — ABNORMAL LOW (ref >60)
Glucose, Bld: 109 mg/dL — ABNORMAL HIGH (ref 70–99)
Potassium: 3.7 mmol/L (ref 3.5–5.1)
Sodium: 140 mmol/L (ref 135–145)

## 2021-04-04 LAB — GLUCOSE, CAPILLARY
Glucose-Capillary: 102 mg/dL — ABNORMAL HIGH (ref 70–99)
Glucose-Capillary: 149 mg/dL — ABNORMAL HIGH (ref 70–99)
Glucose-Capillary: 177 mg/dL — ABNORMAL HIGH (ref 70–99)

## 2021-04-04 MED ORDER — POTASSIUM CHLORIDE CRYS ER 20 MEQ PO TBCR: 20.0000 meq | EXTENDED_RELEASE_TABLET | Freq: Every day | ORAL | Status: DC

## 2021-04-04 MED ORDER — TORSEMIDE 40 MG PO TABS: 40.0000 mg | ORAL_TABLET | Freq: Every day | ORAL | 0 refills | Status: DC

## 2021-04-04 MED ORDER — CARVEDILOL 6.25 MG PO TABS: 6.2500 mg | ORAL_TABLET | Freq: Two times a day (BID) | ORAL | 0 refills | Status: DC

## 2021-04-04 MED ORDER — POTASSIUM CHLORIDE CRYS ER 20 MEQ PO TBCR
20.0000 meq | EXTENDED_RELEASE_TABLET | Freq: Every day | ORAL | 0 refills | Status: DC
Start: 2021-04-05 — End: 2021-09-17

## 2021-04-04 MED ORDER — TORSEMIDE 20 MG PO TABS
40.0000 mg | ORAL_TABLET | Freq: Every day | ORAL | Status: DC
  Administered 2021-04-04: 40 mg via ORAL
  Filled 2021-04-04: qty 2

## 2021-04-04 NOTE — Discharge Summary (Signed)
Physician Discharge Summary  [name] OHY:[id number] DOB: 22-Oct-1960 DOA: 04/01/2021  PCP: [name], MD  Admit date: 04/01/2021 Discharge date: 04/04/2021  Admitted From: home Disposition:  home  Recommendations for Outpatient Follow-up:  Follow up with PCP in 1-2 weeks Follow-up with cardiology Please obtain BMP/CBC in one week  Home Health:no  Equipment/Devices: none  Discharge Condition: Stable Code Status:   Code Status: Full Code Diet recommendation:  Diet Order             Diet heart healthy/carb modified Room service appropriate? Yes; Fluid consistency: Thin; Fluid restriction: 1200 mL Fluid  Diet effective now                   Brief/Interim Summary:  60 year old 60 year old female with chronic diastolic CHF, COPD, morbid obesity BMI 49, GERD, HTN, IDDM, CKD stage IIIb, chronic anemia, chronic hepatitis C seen at PCP office for 15 pound weight gain in 1 week, progressively worsening dyspnea on exertion despite taking oral diuretics at home sent to the ED for further evaluation. In the ED labs stable with BUN 43/creatinine 2.2 previously 1.7-1.8 in July 2022, chest x-ray no active cardiopulmonary disease, given her presentation admitted for acute on chronic diastolic CHF, also had 1 episode of left-sided chest pain in the morning of day of admission but no recurrence. Cardiology  consulted for CHF management and being diuresed. Wt much better Diuretics changed to oral torsemide and potassium supplementation.  Cleared for discharge home. Patient is to follow-up with PCP and outpatient cardiolog  Discharge Diagnoses:    Acute on chronic diastolic CHF: Presented with 15 pound weight gain in a week and progressive dyspnea x2 weeks despite taking oral Lasix.  Previous weight on 03/26/21 112.5 kg on admission 119 kg.  recent echo April 2021 EF 60-65% G2 DD.  On admission peripheral edema BNP low likely  falsely from morbid obesity chest x-ray no pulmonary  edema,repeat echo -LVEF preserved 65-70%, moderately elevated pulmonary artery systolic pressure.D-dimer was negative.appreciate cardiology input. Diuretics changed to oral torsemide and potassium supplementation.  Cleared for discharge home.GDMT-changed labetalol to Coreg, continued on hydralazine add Imdur as OP  if BP allows, can add ACE inhibitors as she is allergic to lisinopril. Net IO Since Admission: -320 mL [04/04/21 1258]    Chest pain x1 negative D-dimer no acute ischemia suspected high-sensitivity troponin negative x2  AKI on CKD stage IIIb Metabolic acidosisL Creat baseline ~1.7-1.8 in July 2022.  Running on slightly higher side-we will allow higher creatinine due to need for Lasix.  Monitor closely. Bmp in 1 wk. Recent Labs  Lab 04/01/21 1242 04/02/21 0846 04/03/21 0241 04/04/21 0255  BUN 43* 39* 44* 46*  CREATININE 2.26* 2.17* 2.29* 2.12*   Mild hypokalemia stable.  Continue potassium supplementation while on torsemide.  BMP in 1 week.   Recent Labs  Lab 04/01/21 1242 04/02/21 0846 04/03/21 0241 04/04/21 0255  K 3.6 3.8 3.3* 3.7    COPD-Not in exacerbation. cont Pulmicort nebulizer and bronchodilators.   Chronic respiratory failure on home oxygen but recently the canister were off/taken off o2.   Hypertension-well-controlled,adjusting meds for Chf AS #1-W / amlodipine hydralazine coreg.  HLD on Lipitor  IDDM with diabetic neuropathy, HbA1c 7.6 12/05/2020, at home on 60 Lantus bedtime, resume home meds outpatient PCP follow-up Recent Labs  Lab 04/03/21 1556 04/03/21 1938 04/03/21 2059 04/04/21 0539 04/04/21 1104  GLUCAP 123* 194* 184* 102* 149*    Chronic anemia likely from chronic kidney disease-stable Recent Labs  Lab 04/01/21 1242 04/02/21 1200 04/03/21 0241 04/04/21 0255  HGB 9.1* 8.9* 8.4* 8.6*  HCT 30.0* 29.2* 26.8* 27.5*    Morbid obesity BMI 49>44: Will benefit with weight loss, PCP follow-up and sleep apnea evaluation as  outpatient.   Consults: Cardiology  Subjective: Leg swelling improved.  Resting comfortably.  Overall feels improved.  Discharge Exam: Vitals:   04/04/21 0545 04/04/21 0750  BP: 135/73   Pulse: 80   Resp: 19   Temp: 98.2 F (36.8 C)   SpO2: 96% 96%   General: Pt is alert, awake, not in acute distress Cardiovascular: RRR, S1/S2 +, no rubs, no gallops Respiratory: CTA bilaterally, no wheezing, no rhonchi Abdominal: Soft, NT, ND, bowel sounds + Extremities: no edema, no cyanosis  Discharge Instructions  Discharge Instructions     (HEART FAILURE PATIENTS) Call MD:  Anytime you have any of the following symptoms: 1) 3 pound weight gain in 24 hours or 5 pounds in 1 week 2) shortness of breath, with or without a dry hacking cough 3) swelling in the hands, feet or stomach 4) if you have to sleep on extra pillows at night in order to breathe.   Complete by: As directed    Discharge instructions   Complete by: As directed    Check BMP in 1 week  Follow-up with the cardiology  Please call call MD or return to ER for similar or worsening recurring problem that brought you to hospital or if any fever,nausea/vomiting,abdominal pain, uncontrolled pain, chest pain,  shortness of breath or any other alarming symptoms.  Please follow-up your doctor as instructed in a week time and call the office for appointment.  Please avoid alcohol, smoking, or any other illicit substance and maintain healthy habits including taking your regular medications as prescribed.  You were cared for by a hospitalist during your hospital stay. If you have any questions about your discharge medications or the care you received while you were in the hospital after you are discharged, you can call the unit and ask to speak with the hospitalist on call if the hospitalist that took care of you is not available.  Once you are discharged, your primary care physician will handle any further medical issues. Please note  that NO REFILLS for any discharge medications will be authorized once you are discharged, as it is imperative that you return to your primary care physician (or establish a relationship with a primary care physician if you do not have one) for your aftercare needs so that they can reassess your need for medications and monitor your lab values   Increase activity slowly   Complete by: As directed       Allergies as of 04/04/2021       Reactions   Lisinopril Swelling        Medication List     STOP taking these medications    furosemide 40 MG tablet Commonly known as: LASIX   labetalol 300 MG tablet Commonly known as: NORMODYNE       TAKE these medications    albuterol 108 (90 Base) MCG/ACT inhaler Commonly known as: VENTOLIN HFA Inhale 1 puff into the lungs every 4 (four) hours as needed for wheezing or shortness of breath.   albuterol (2.5 MG/3ML) 0.083% nebulizer solution Commonly known as: PROVENTIL Take 3 mLs (2.5 mg total) by nebulization every 4 (four) hours as needed for wheezing or shortness of breath.   amLODipine 10 MG tablet Commonly known as: NORVASC Take 1  tablet (10 mg total) by mouth daily.   atorvastatin 40 MG tablet Commonly known as: LIPITOR Take 1 tablet (40 mg total) by mouth every morning. Must have HF appt for further refills   blood glucose meter kit and supplies Kit Dispense based on patient and insurance preference. Use up to four times daily as directed. (FOR ICD-9 250.00, 250.01).   carvedilol 6.25 MG tablet Commonly known as: COREG Take 1 tablet (6.25 mg total) by mouth 2 (two) times daily with a meal.   doxycycline 100 MG capsule Commonly known as: VIBRAMYCIN Take 1 capsule (100 mg total) by mouth 2 (two) times daily. What changed:  when to take this additional instructions   DRY EYES OP Place 1 drop into both eyes daily as needed (dry eyes).   ferrous sulfate 325 (65 FE) MG tablet Take 325 mg by mouth daily with breakfast.    fluticasone 110 MCG/ACT inhaler Commonly known as: FLOVENT HFA Inhale 2 puffs into the lungs 2 (two) times daily.   hydrALAZINE 50 MG tablet Commonly known as: APRESOLINE Take 1 tablet (50 mg total) by mouth 3 (three) times daily.   Lantus SoloStar 100 UNIT/ML Solostar Pen Generic drug: insulin glargine INJECT 60 UNITS SUBCUTANEOUSLY EVERYDAY AT BEDTIME What changed: See the new instructions.   potassium chloride SA 20 MEQ tablet Commonly known as: KLOR-CON Take 1 tablet (20 mEq total) by mouth daily. Start taking on: April 05, 2021   pregabalin 75 MG capsule Commonly known as: LYRICA TAKE ONE CAPSULE BY MOUTH EVERY MORNING and TAKE ONE CAPSULE BY MOUTH EVERY EVENING What changed:  how much to take how to take this when to take this additional instructions   Torsemide 40 MG Tabs Take 40 mg by mouth daily. Start taking on: April 05, 2021   Victoza 18 MG/3ML Sopn Generic drug: liraglutide INJECT 1.63m into THE SKIN DAILY AS DIRECTED What changed: See the new instructions.   Vitamin D (Ergocalciferol) 1.25 MG (50000 UNIT) Caps capsule Commonly known as: DRISDOL Take 50,000 Units by mouth every Monday.        Follow-up Information     NCharlott Rakes MD Follow up in 1 week(s).   Specialty: Family Medicine Contact information: [place] [phone]        HMinus Breeding MD .   Specialty: Cardiology Contact information: [address] [place] [phone]               Allergies  Allergen Reactions   Lisinopril Swelling    The results of significant diagnostics from this hospitalization (including imaging, microbiology, ancillary and laboratory) are listed below for reference.    Microbiology: Recent Results (from the past 240 hour(s))  Resp Panel by RT-PCR (Flu A&B, Covid) Nasopharyngeal Swab     Status: None   Collection Time: 04/01/21 12:38 PM   Specimen:  Nasopharyngeal Swab; Nasopharyngeal(NP) swabs in vial transport medium  Result Value Ref Range Status   SARS Coronavirus 2 by RT PCR NEGATIVE NEGATIVE Final    Comment: (NOTE) SARS-CoV-2 target nucleic acids are NOT DETECTED.  The SARS-CoV-2 RNA is generally detectable in upper respiratory specimens during the acute phase of infection. The lowest concentration of SARS-CoV-2 viral copies this assay can detect is 138 copies/mL. A negative result does not preclude SARS-Cov-2 infection and should not be used as the sole basis for treatment or other patient management decisions. A negative result may occur with  improper specimen collection/handling,  submission of specimen other than nasopharyngeal swab, presence of viral mutation(s) within the areas targeted by this assay, and inadequate number of viral copies(<138 copies/mL). A negative result must be combined with clinical observations, patient history, and epidemiological information. The expected result is Negative.  Fact Sheet for Patients:  EntrepreneurPulse.com.au  Fact Sheet for Healthcare Providers:  IncredibleEmployment.be  This test is no t yet approved or cleared by the [place] FDA and  has been authorized for detection and/or diagnosis of SARS-CoV-2 by FDA under an Emergency Use Authorization (EUA). This EUA will remain  in effect (meaning this test can be used) for the duration of the COVID-19 declaration under Section 564(b)(1) of the Act, 21 U.S.C.section 360bbb-3(b)(1), unless the authorization is terminated  or revoked sooner.       Influenza A by PCR NEGATIVE NEGATIVE Final   Influenza B by PCR NEGATIVE NEGATIVE Final    Comment: (NOTE) The Xpert Xpress SARS-CoV-2/FLU/RSV plus assay is intended as an aid in the diagnosis of influenza from Nasopharyngeal swab specimens and should not be used as a sole basis for treatment. Nasal washings and aspirates are unacceptable for  Xpert Xpress SARS-CoV-2/FLU/RSV testing.  Fact Sheet for Patients: EntrepreneurPulse.com.au  Fact Sheet for Healthcare Providers: IncredibleEmployment.be  This test is not yet approved or cleared by the [place] FDA and has been authorized for detection and/or diagnosis of SARS-CoV-2 by FDA under an Emergency Use Authorization (EUA). This EUA will remain in effect (meaning this test can be used) for the duration of the COVID-19 declaration under Section 564(b)(1) of the Act, 21 U.S.C. section 360bbb-3(b)(1), unless the authorization is terminated or revoked.  Performed at [facility], [place] [address], [place], [place] [address]     Procedures/Studies: DG Chest 2 View  Result Date: 04/01/2021 CLINICAL DATA:  Dyspnea on exertion, chest pain. EXAM: CHEST - 2 VIEW COMPARISON:  February 12, 2021. FINDINGS: Stable cardiomediastinal silhouette. Both lungs are clear. The visualized skeletal structures are unremarkable. IMPRESSION: No active cardiopulmonary disease. Electronically Signed   By: Marijo Conception M.D.   On: 04/01/2021 13:17   ECHOCARDIOGRAM COMPLETE  Result Date: 04/02/2021    ECHOCARDIOGRAM REPORT   Patient Name:   [name] [name] Date of Exam: 04/02/2021 Medical Rec #:  [id number]      Height:       61.0 in Accession #:    [id number]     Weight:       262.3 lb Date of Birth:  Nov 04, 1960      BSA:          2.120 m Patient Age:    60 years       BP:           156/84 mmHg Patient Gender: F              HR:           83 bpm. Exam Location:  Inpatient Procedure: 2D Echo, Cardiac Doppler and Color Doppler Indications:    I50.30* Unspecified diastolic (congestive) heart failure  History:        Patient has prior history of Echocardiogram examinations, most                 recent 11/07/2019. CHF, Signs/Symptoms:Chest Pain; Risk                 Factors:Diabetes.  Sonographer:    MH Referring Phys: [id number] [place]  1. Left  ventricular ejection fraction, by estimation,  is 65 to 70%. The left ventricle has normal function. The left ventricle has no regional wall motion abnormalities. Left ventricular diastolic parameters are indeterminate.  2. Right ventricular systolic function is normal. The right ventricular size is normal. There is moderately elevated pulmonary artery systolic pressure.  3. The mitral valve is normal in structure. Trivial mitral valve regurgitation.  4. Tricuspid valve regurgitation is mild to moderate.  5. The aortic valve is normal in structure. Aortic valve regurgitation is not visualized.  6. The inferior vena cava is normal in size with greater than 50% respiratory variability, suggesting right atrial pressure of 3 mmHg. FINDINGS  Left Ventricle: Left ventricular ejection fraction, by estimation, is 65 to 70%. The left ventricle has normal function. The left ventricle has no regional wall motion abnormalities. The left ventricular internal cavity size was normal in size. There is  no left ventricular hypertrophy. Left ventricular diastolic parameters are indeterminate. Right Ventricle: The right ventricular size is normal. Right vetricular wall thickness was not assessed. Right ventricular systolic function is normal. There is moderately elevated pulmonary artery systolic pressure. The tricuspid regurgitant velocity is  3.28 m/s, and with an assumed right atrial pressure of 3 mmHg, the estimated right ventricular systolic pressure is 70.0 mmHg. Left Atrium: Left atrial size was normal in size. Right Atrium: Right atrial size was normal in size. Pericardium: Trivial pericardial effusion is present. Mitral Valve: The mitral valve is normal in structure. Trivial mitral valve regurgitation. Tricuspid Valve: The tricuspid valve is normal in structure. Tricuspid valve regurgitation is mild to moderate. Aortic Valve: The aortic valve is normal in structure. Aortic valve regurgitation is not visualized. Aortic  regurgitation PHT measures 855 msec. Aortic valve mean gradient measures 8.0 mmHg. Aortic valve peak gradient measures 14.7 mmHg. Aortic valve area, by VTI measures 2.85 cm. Pulmonic Valve: The pulmonic valve was not well visualized. Pulmonic valve regurgitation is trivial. Aorta: The aortic root is normal in size and structure. Venous: The inferior vena cava is normal in size with greater than 50% respiratory variability, suggesting right atrial pressure of 3 mmHg. IAS/Shunts: The interatrial septum was not assessed.  LEFT VENTRICLE PLAX 2D LVIDd:         3.80 cm     Diastology LVIDs:         2.50 cm     LV e' medial:    5.11 cm/s LV PW:         1.30 cm     LV E/e' medial:  24.1 LV IVS:        1.40 cm     LV e' lateral:   8.70 cm/s LVOT diam:     2.40 cm     LV E/e' lateral: 14.1 LV SV:         131 LV SV Index:   62 LVOT Area:     4.52 cm  LV Volumes (MOD) LV vol d, MOD A4C: 49.3 ml LV vol s, MOD A4C: 16.8 ml LV SV MOD A4C:     49.3 ml RIGHT VENTRICLE             IVC RV S prime:     13.90 cm/s  IVC diam: 2.10 cm TAPSE (M-mode): 2.6 cm LEFT ATRIUM           Index       RIGHT ATRIUM          Index LA diam:      3.20 cm 1.51 cm/m  RA Area:  7.27 cm LA Vol (A2C): 34.6 ml 16.32 ml/m RA Volume:   11.00 ml 5.19 ml/m LA Vol (A4C): 47.3 ml 22.32 ml/m  AORTIC VALVE                    PULMONIC VALVE AV Area (Vmax):    3.04 cm     PV Vmax:       1.11 m/s AV Area (Vmean):   2.84 cm     PV Peak grad:  4.9 mmHg AV Area (VTI):     2.85 cm AV Vmax:           192.00 cm/s AV Vmean:          139.000 cm/s AV VTI:            0.458 m AV Peak Grad:      14.7 mmHg AV Mean Grad:      8.0 mmHg LVOT Vmax:         129.00 cm/s LVOT Vmean:        87.400 cm/s LVOT VTI:          0.289 m LVOT/AV VTI ratio: 0.63 AI PHT:            855 msec  AORTA Ao Root diam: 2.80 cm Ao Asc diam:  3.20 cm MITRAL VALVE                TRICUSPID VALVE MV Area (PHT): 2.59 cm     TR Peak grad:   43.0 mmHg MV E velocity: 123.00 cm/s  TR Vmax:        328.00  cm/s MV A velocity: 141.00 cm/s MV E/A ratio:  0.87         SHUNTS                             Systemic VTI:  0.29 m                             Systemic Diam: 2.40 cm Dorris Carnes MD Electronically signed by Dorris Carnes MD Signature Date/Time: 04/02/2021/12:41:56 PM    Final     Labs: BNP (last 3 results) Recent Labs    12/24/20 2259 02/12/21 1204 04/01/21 1242  BNP 49.0 35.1 17.3   Basic Metabolic Panel: Recent Labs  Lab 04/01/21 1242 04/02/21 0846 04/02/21 1200 04/03/21 0241 04/04/21 0255  NA 143 141  --  141 140  K 3.6 3.8  --  3.3* 3.7  CL 113* 110  --  105 106  CO2 19* 20*  --  24 24  GLUCOSE 105* 137*  --  150* 109*  BUN 43* 39*  --  44* 46*  CREATININE 2.26* 2.17*  --  2.29* 2.12*  CALCIUM 8.8* 8.7*  --  8.8* 8.8*  MG  --   --  1.8  --   --    Liver Function Tests: Recent Labs  Lab 04/01/21 1242  AST 15  ALT 13  ALKPHOS 58  BILITOT 0.7  PROT 7.0  ALBUMIN 3.9   No results for input(s): LIPASE, AMYLASE in the last 168 hours. No results for input(s): AMMONIA in the last 168 hours. CBC: Recent Labs  Lab 04/01/21 1242 04/02/21 1200 04/03/21 0241 04/04/21 0255  WBC 4.4 3.8* 3.5* 3.9*  NEUTROABS 2.9 2.3  --   --   HGB 9.1* 8.9* 8.4* 8.6*  HCT  30.0* 29.2* 26.8* 27.5*  MCV 91.2 90.7 89.0 87.9  PLT 153 146* 146* 145*   Cardiac Enzymes: No results for input(s): CKTOTAL, CKMB, CKMBINDEX, TROPONINI in the last 168 hours. BNP: Invalid input(s): POCBNP CBG: Recent Labs  Lab 04/03/21 1556 04/03/21 1938 04/03/21 2059 04/04/21 0539 04/04/21 1104  GLUCAP 123* 194* 184* 102* 149*   D-Dimer Recent Labs    04/02/21 0243  DDIMER 0.46   Hgb A1c Recent Labs    04/02/21 0243  HGBA1C 6.0*   Lipid Profile No results for input(s): CHOL, HDL, LDLCALC, TRIG, CHOLHDL, LDLDIRECT in the last 72 hours. Thyroid function studies No results for input(s): TSH, T4TOTAL, T3FREE, THYROIDAB in the last 72 hours.  Invalid input(s): FREET3 Anemia work up No results for  input(s): VITAMINB12, FOLATE, FERRITIN, TIBC, IRON, RETICCTPCT in the last 72 hours. Urinalysis    Component Value Date/Time   COLORURINE STRAW (A) 04/09/2019 1615   APPEARANCEUR CLOUDY (A) 04/09/2019 1615   LABSPEC 1.010 04/09/2019 1615   PHURINE 5.0 04/09/2019 1615   GLUCOSEU >=500 (A) 04/09/2019 1615   HGBUR SMALL (A) 04/09/2019 1615   HGBUR negative 06/29/2010 1356   BILIRUBINUR NEGATIVE 04/09/2019 1615   KETONESUR NEGATIVE 04/09/2019 1615   PROTEINUR 100 (A) 04/09/2019 1615   UROBILINOGEN 1.0 01/26/2015 0925   NITRITE NEGATIVE 04/09/2019 1615   LEUKOCYTESUR SMALL (A) 04/09/2019 1615   Sepsis Labs Invalid input(s): PROCALCITONIN,  WBC,  LACTICIDVEN Microbiology Recent Results (from the past 240 hour(s))  Resp Panel by RT-PCR (Flu A&B, Covid) Nasopharyngeal Swab     Status: None   Collection Time: 04/01/21 12:38 PM   Specimen: Nasopharyngeal Swab; Nasopharyngeal(NP) swabs in vial transport medium  Result Value Ref Range Status   SARS Coronavirus 2 by RT PCR NEGATIVE NEGATIVE Final    Comment: (NOTE) SARS-CoV-2 target nucleic acids are NOT DETECTED.  The SARS-CoV-2 RNA is generally detectable in upper respiratory specimens during the acute phase of infection. The lowest concentration of SARS-CoV-2 viral copies this assay can detect is 138 copies/mL. A negative result does not preclude SARS-Cov-2 infection and should not be used as the sole basis for treatment or other patient management decisions. A negative result may occur with  improper specimen collection/handling, submission of specimen other than nasopharyngeal swab, presence of viral mutation(s) within the areas targeted by this assay, and inadequate number of viral copies(<138 copies/mL). A negative result must be combined with clinical observations, patient history, and epidemiological information. The expected result is Negative.  Fact Sheet for Patients:  EntrepreneurPulse.com.au  Fact Sheet  for Healthcare Providers:  IncredibleEmployment.be  This test is no t yet approved or cleared by the [place] FDA and  has been authorized for detection and/or diagnosis of SARS-CoV-2 by FDA under an Emergency Use Authorization (EUA). This EUA will remain  in effect (meaning this test can be used) for the duration of the COVID-19 declaration under Section 564(b)(1) of the Act, 21 U.S.C.section 360bbb-3(b)(1), unless the authorization is terminated  or revoked sooner.       Influenza A by PCR NEGATIVE NEGATIVE Final   Influenza B by PCR NEGATIVE NEGATIVE Final    Comment: (NOTE) The Xpert Xpress SARS-CoV-2/FLU/RSV plus assay is intended as an aid in the diagnosis of influenza from Nasopharyngeal swab specimens and should not be used as a sole basis for treatment. Nasal washings and aspirates are unacceptable for Xpert Xpress SARS-CoV-2/FLU/RSV testing.  Fact Sheet for Patients: EntrepreneurPulse.com.au  Fact Sheet for Healthcare Providers: IncredibleEmployment.be  This test is  not yet approved or cleared by the [place] and has been authorized for detection and/or diagnosis of SARS-CoV-2 by FDA under an Emergency Use Authorization (EUA). This EUA will remain in effect (meaning this test can be used) for the duration of the COVID-19 declaration under Section 564(b)(1) of the Act, 21 U.S.C. section 360bbb-3(b)(1), unless the authorization is terminated or revoked.  Performed at [facility], [place] [address] [place], [place] [address]      Time coordinating discharge: 35 minutes  SIGNED: [name], MD  [facility] 04/04/2021, 12:59 PM  If 7PM-7AM, please contact night-coverage [email or web address]

## 2021-04-04 NOTE — Progress Notes (Signed)
Cardiology Progress Note  Patient ID: [name] [name] MRN: [id number] DOB: 09/01/1960 Date of Encounter: 04/04/2021  Primary Cardiologist: Minus Breeding, MD  Subjective   Chief Complaint: None.  HPI: Shortness of breath improving.  Trace edema on exam.  Can transition to oral diuretics per my recommendation.  Counseled extensively on salt reductive strategies.  She also does not like to take her blood pressure medications apparently.  ROS:  All other ROS reviewed and negative. Pertinent positives noted in the HPI.     Inpatient Medications  Scheduled Meds:  amLODipine  10 mg Oral Daily   atorvastatin  40 mg Oral q morning   budesonide  1 mg Inhalation BID   carvedilol  6.25 mg Oral BID WC   ferrous sulfate  325 mg Oral Q breakfast   heparin  5,000 Units Subcutaneous Q8H   hydrALAZINE  50 mg Oral TID   insulin aspart  0-5 Units Subcutaneous QHS   insulin aspart  0-9 Units Subcutaneous TID WC   insulin glargine-yfgn  60 Units Subcutaneous QHS   [START ON 04/05/2021] potassium chloride  20 mEq Oral Daily   pregabalin  75 mg Oral BID   torsemide  40 mg Oral Daily   Continuous Infusions:  PRN Meds: acetaminophen **OR** acetaminophen, albuterol   Vital Signs   Vitals:   04/03/21 2022 04/03/21 2028 04/04/21 0545 04/04/21 0750  BP: 133/66  135/73   Pulse: 81  80   Resp: 18  19   Temp: 98 F (36.7 C)  98.2 F (36.8 C)   TempSrc: Oral  Oral   SpO2: 97% 95% 96% 96%  Weight:      Height:        Intake/Output Summary (Last 24 hours) at 04/04/2021 0948 Last data filed at 04/04/2021 0840 Gross per 24 hour  Intake 1280 ml  Output --  Net 1280 ml   Last 3 Weights 04/03/2021 04/02/2021 04/01/2021  Weight (lbs) 257 lb 0.9 oz 259 lb 7.7 oz 262 lb 5.6 oz  Weight (kg) 116.6 kg 117.7 kg 119 kg      Telemetry  Overnight telemetry shows sinus rhythm in the 80s, which I personally reviewed.   Physical Exam   Vitals:   04/03/21 2022 04/03/21 2028 04/04/21 0545 04/04/21 0750   BP: 133/66  135/73   Pulse: 81  80   Resp: 18  19   Temp: 98 F (36.7 C)  98.2 F (36.8 C)   TempSrc: Oral  Oral   SpO2: 97% 95% 96% 96%  Weight:      Height:        Intake/Output Summary (Last 24 hours) at 04/04/2021 0948 Last data filed at 04/04/2021 0840 Gross per 24 hour  Intake 1280 ml  Output --  Net 1280 ml    Last 3 Weights 04/03/2021 04/02/2021 04/01/2021  Weight (lbs) 257 lb 0.9 oz 259 lb 7.7 oz 262 lb 5.6 oz  Weight (kg) 116.6 kg 117.7 kg 119 kg    Body mass index is 44.12 kg/m.   General: Well nourished, well developed, in no acute distress Head: Atraumatic, normal size  Eyes: PEERLA, EOMI  Neck: Supple, no JVD Endocrine: No thryomegaly Cardiac: Normal S1, S2; RRR; no murmurs, rubs, or gallops Lungs: Clear to auscultation bilaterally, no wheezing, rhonchi or rales  Abd: Soft, nontender, no hepatomegaly  Ext: Trace edema Musculoskeletal: No deformities, BUE and BLE strength normal and equal Skin: Warm and dry, no rashes   Neuro: Alert and oriented to person,  place, time, and situation, CNII-XII grossly intact, no focal deficits  Psych: Normal mood and affect   Labs  High Sensitivity Troponin:   Recent Labs  Lab 04/01/21 1242 04/01/21 2311  TROPONINIHS 7 5     Cardiac EnzymesNo results for input(s): TROPONINI in the last 168 hours. No results for input(s): TROPIPOC in the last 168 hours.  Chemistry Recent Labs  Lab 04/01/21 1242 04/02/21 0846 04/03/21 0241 04/04/21 0255  NA 143 141 141 140  K 3.6 3.8 3.3* 3.7  CL 113* 110 105 106  CO2 19* 20* 24 24  GLUCOSE 105* 137* 150* 109*  BUN 43* 39* 44* 46*  CREATININE 2.26* 2.17* 2.29* 2.12*  CALCIUM 8.8* 8.7* 8.8* 8.8*  PROT 7.0  --   --   --   ALBUMIN 3.9  --   --   --   AST 15  --   --   --   ALT 13  --   --   --   ALKPHOS 58  --   --   --   BILITOT 0.7  --   --   --   GFRNONAA 24* 25* 24* 26*  ANIONGAP 11 11 12 10     Hematology Recent Labs  Lab 04/02/21 1200 04/03/21 0241 04/04/21 0255   WBC 3.8* 3.5* 3.9*  RBC 3.22* 3.01* 3.13*  HGB 8.9* 8.4* 8.6*  HCT 29.2* 26.8* 27.5*  MCV 90.7 89.0 87.9  MCH 27.6 27.9 27.5  MCHC 30.5 31.3 31.3  RDW 14.0 14.1 14.2  PLT 146* 146* 145*   BNP Recent Labs  Lab 04/01/21 1242  BNP 92.8    DDimer  Recent Labs  Lab 04/02/21 0243  DDIMER 0.46     Radiology  No results found.  Cardiac Studies  TTE 04/02/2021  1. Left ventricular ejection fraction, by estimation, is 65 to 70%. The  left ventricle has normal function. The left ventricle has no regional  wall motion abnormalities. Left ventricular diastolic parameters are  indeterminate.   2. Right ventricular systolic function is normal. The right ventricular  size is normal. There is moderately elevated pulmonary artery systolic  pressure.   3. The mitral valve is normal in structure. Trivial mitral valve  regurgitation.   4. Tricuspid valve regurgitation is mild to moderate.   5. The aortic valve is normal in structure. Aortic valve regurgitation is  not visualized.   6. The inferior vena cava is normal in size with greater than 50%  respiratory variability, suggesting right atrial pressure of 3 mmHg.   Patient Profile  [name] [name] is a 60 y.o. female with chronic diastolic heart failure, hypertension, diabetes, CKD stage IIIb, morbid obesity, OSA who was admitted on 04/02/2021 for acute on chronic diastolic heart failure.  Assessment & Plan   #Acute HFpEF -Chest x-ray clear.  Trace edema on exam.  Cardiovascular examination is really unremarkable today. -Transition to 40 mg of torsemide daily.  She can be discharged today from my standpoint.  Would recommend aggressive diabetes control as well as monitoring her blood pressure at home.  She apparently misses doses of medications and this will not help. -GFR a bit too low for Jardiance.  Could be followed in the outpatient setting for this if kidney function improves. -Continue current blood pressure regimen which  includes Coreg 6.25 mg twice daily, hydralazine 50 mg 3 times daily, amlodipine 10 mg daily -She already has hospital follow-up in our heart failure transitions clinic on 04/13/2021.  [facility]  will sign off.   Medication Recommendations: Diuretics as above.  Torsemide 40 mg daily.  Would send her out with potassium 20 mg with her diuretic.  Continue home antihypertensive agents that she has received here. Other recommendations (labs, testing, etc): None. Follow up as an outpatient: Follow-up has been arranged.  For questions or updates, please contact [facility] Please consult [email or web address] for contact info under   Time Spent with Patient: I have spent a total of 25 minutes with patient reviewing hospital notes, telemetry, EKGs, labs and examining the patient as well as establishing an assessment and plan that was discussed with the patient.  > 50% of time was spent in direct patient care.    Signed, Addison Naegeli. Audie Box, MD, [place]  04/04/2021 9:48 AM

## 2021-04-05 ENCOUNTER — Telehealth: Payer: Self-pay

## 2021-04-05 NOTE — Telephone Encounter (Signed)
Transition Care Management Follow-up Telephone Call Date of discharge and from where: 04/04/2021 from [facility] How have you been since you were released from the hospital? Pt stated that she is feeling some better. Pt stated that she is aware of the medication changes and that her daughter are with her to assist with medication needs and getting to her appts.  Any questions or concerns? No  Items Reviewed: Did the pt receive and understand the discharge instructions provided? Yes  Medications obtained and verified? Yes  Other? No  Any new allergies since your discharge? No  Dietary orders reviewed? No Do you have support at home? Yes   Functional Questionnaire: (I = Independent and D = Dependent) ADLs: I  Bathing/Dressing- I  Meal Prep- I  Eating- I  Maintaining continence- I  Transferring/Ambulation- I  Managing Meds- I   Follow up appointments reviewed:  PCP Hospital f/u appt confirmed? No  Pt stated she is calling in the morning to schedule.  [facility] f/u appt confirmed? Yes  Scheduled to see Vasc on 04/13/21 @ 3pm. Are transportation arrangements needed? No  If their condition worsens, is the pt aware to call PCP or go to the Emergency Dept.? Yes Was the patient provided with contact information for the PCP's office or ED? Yes Was to pt encouraged to call back with questions or concerns? Yes

## 2021-04-06 ENCOUNTER — Telehealth: Payer: Self-pay

## 2021-04-06 NOTE — Telephone Encounter (Signed)
Transition Care Management Follow-up Telephone Call Date of discharge and from where: 04/04/2021 from [facility] How have you been since you were released from the hospital? Pt stated that she is feeling some better.   Any questions or concerns? No   Items Reviewed: Did the pt receive and understand the discharge instructions provided? Yes  Medications obtained and verified? Yes  Other? No  Any new allergies since your discharge? No  Dietary orders reviewed? No Do you have support at home? Yes family /daughter   Functional Questionnaire: (I = Independent and D = Dependent) ADLs: I       Follow up appointments reviewed:   PCP Hospital f/u appt confirmed? Yes scheduled on 04/21/2021 at 10 :102 am.  [facility] f/u appt confirmed? Yes  Scheduled to see Vasc on 04/13/21 @ 3pm. Are transportation arrangements needed? No  If their condition worsens, is the pt aware to call PCP or go to the Emergency Dept.? Yes Was the patient provided with contact information for the PCP's office or ED? Yes Was to pt encouraged to call back with questions or concerns? Yes

## 2021-04-07 ENCOUNTER — Encounter (HOSPITAL_COMMUNITY): Payer: Self-pay

## 2021-04-07 LAB — CYTOLOGY - PAP
Comment: NEGATIVE
Diagnosis: NEGATIVE
High risk HPV: NEGATIVE

## 2021-04-08 ENCOUNTER — Other Ambulatory Visit: Payer: Self-pay | Admitting: Obstetrics and Gynecology

## 2021-04-08 ENCOUNTER — Other Ambulatory Visit: Payer: Self-pay

## 2021-04-08 ENCOUNTER — Other Ambulatory Visit: Payer: Self-pay | Admitting: Family Medicine

## 2021-04-08 MED ORDER — METRONIDAZOLE 500 MG PO TABS: 500.0000 mg | ORAL_TABLET | Freq: Two times a day (BID) | ORAL | 0 refills | Status: AC

## 2021-04-08 NOTE — Patient Outreach (Signed)
Medicaid Managed Care   Nurse Care Manager Note  04/08/2021 Name:  [name] [name] MRN:  [id number] DOB:  06-06-1961  [name] [name] is an 60 y.o. year old female who is a primary patient of [name], MD.  The [facility] Managed Care Coordination team was consulted for assistance with:    Chronic healthcare management needs.  [name] [name] was given information about Medicaid Managed Care Coordination team services today. Tamala Ser Patient agreed to services and verbal consent obtained.  Engaged with patient by telephone for follow up visit in response to provider referral for case management and/or care coordination services.   Assessments/Interventions:  Review of past medical history, allergies, medications, health status, including review of consultants reports, laboratory and other test data, was performed as part of comprehensive evaluation and provision of chronic care management services.  SDOH (Social Determinants of Health) assessments and interventions performed: SDOH Interventions    Flowsheet Row Most Recent Value  SDOH Interventions   Physical Activity Interventions Other (Comments)  [patient not physically able to engage in moderate to strenuous exercise]       Care Plan  Allergies  Allergen Reactions   Lisinopril Swelling    Medications Reviewed Today     Reviewed by Gayla Medicus, RN (Registered Nurse) on 04/08/21 at [phone]  Med List Status: <None>   Medication Order Taking? Sig Documenting Provider Last Dose Status Informant  albuterol (PROVENTIL) (2.5 MG/3ML) 0.083% nebulizer solution [id number] Yes Take 3 mLs (2.5 mg total) by nebulization every 4 (four) hours as needed for wheezing or shortness of breath. Henderly, Britni A, PA-C Taking Active Self  albuterol (VENTOLIN HFA) 108 (90 Base) MCG/ACT inhaler [id number]  Inhale 1 puff into the lungs every 4 (four) hours as needed for wheezing or shortness of breath. [name], MD  Active Self   amLODipine (NORVASC) 10 MG tablet [id number]  Take 1 tablet (10 mg total) by mouth daily. [name], FNP  Active Self  Artificial Tear Ointment (DRY EYES OP) [id number]  Place 1 drop into both eyes daily as needed (dry eyes). [provider]  Active Self  atorvastatin (LIPITOR) 40 MG tablet [id number]  Take 1 tablet (40 mg total) by mouth every morning. Must have HF appt for further refills [name], FNP  Active Self  blood glucose meter kit and supplies KIT [id number]  Dispense based on patient and insurance preference. Use up to four times daily as directed. (FOR ICD-9 250.00, 250.01). Dustin Flock, MD  Active Self  carvedilol (COREG) 6.25 MG tablet [id number]  Take 1 tablet (6.25 mg total) by mouth 2 (two) times daily with a meal. Kc, Ramesh, MD  Active   doxycycline (VIBRAMYCIN) 100 MG capsule [id number] No Take 1 capsule (100 mg total) by mouth 2 (two) times daily.  Patient not taking: Reported on 04/08/2021   Cherrie Gauze Not Taking Active            Med Note [place] Apr 02, 2021  2:21 AM) Course began 03/28/21 pt took 2 doses. Pt last took 03/31/21 2 doses taken  ferrous sulfate 325 (65 FE) MG tablet [id number]  Take 325 mg by mouth daily with breakfast. [provider]  Active Self  fluticasone (FLOVENT HFA) 110 MCG/ACT inhaler [id number]  Inhale 2 puffs into the lungs 2 (two) times daily. [name], MD  Active Self  hydrALAZINE (APRESOLINE) 50 MG tablet [id number]  Take 1 tablet (50 mg total) by mouth 3 (  three) times daily. [name], MD  Active Self  LANTUS SOLOSTAR 100 UNIT/ML Solostar Pen [id number]  INJECT 60 UNITS SUBCUTANEOUSLY EVERYDAY AT BEDTIME  Patient taking differently: Inject 60 Units into the skin at bedtime.   [name], MD  Active Self  potassium chloride SA (KLOR-CON) 20 MEQ tablet [id number]  Take 1 tablet (20 mEq total) by mouth daily. [name], MD  Active   pregabalin (LYRICA) 75 MG capsule [id number]  TAKE  ONE CAPSULE BY MOUTH EVERY MORNING and TAKE ONE CAPSULE BY MOUTH EVERY EVENING  Patient taking differently: Take 75 mg by mouth 2 (two) times daily.   [name], MD  Active Self  torsemide 40 MG TABS [id number]  Take 40 mg by mouth daily. [name], MD  Active   VICTOZA 18 MG/3ML Bonney Aid [id number]  INJECT 1.75m into THE SKIN DAILY AS DIRECTED  Patient taking differently: Inject 1.8 mg into the skin daily.   NCharlott Rakes MD  Active Self  Vitamin D, Ergocalciferol, (DRISDOL) 1.25 MG (50000 UNIT) CAPS capsule [id number] Take 50,000 Units by mouth every Monday. [provider]  Active Self            Patient Active Problem List   Diagnosis Date Noted   CHF exacerbation ([facility] 04/02/2021   Chest pain 04/02/2021   Acute-on-chronic kidney injury ([facility] 04/02/2021   COPD exacerbation ([facility] 12/05/2020   COPD with acute exacerbation ([facility] 12/04/2020   CKD stage 3 due to type 2 diabetes mellitus ([facility] 12/04/2020   Anemia of chronic disease 12/04/2020   Class 3 obesity 07/27/2020   Acute respiratory disease due to COVID-19 virus 07/25/2020   Acute hypoxemic respiratory failure due to COVID-19 ([facility] 07/25/2020   Morbid obesity ([facility] 11/23/2019   Acute respiratory failure with hypoxia ([facility] 11/06/2019   Chronic kidney disease, stage 3b ([facility] 11/06/2019   COPD with acute bronchitis ([facility]    Acute kidney failure ([facility] 08/18/2019   Left ventricular hypertrophy 07/21/2019   Educated about COVID-19 virus infection 002/05/1734  Acute diastolic HF (heart failure) ([facility]    Hypoxia    Dyspnea on exertion 05/09/2019   Sleep apnea 05/09/2019   Hyperglycemia 04/09/2019   Snoring 03/13/2019   Symptomatic anemia 08/17/2018   Acute renal failure (ARF) ([facility] 08/17/2018   Chronic cystitis 08/03/2018   Diabetic neuropathy ([facility] 06/16/2017   Non compliance w medication regimen 04/12/2017   Family history of colon cancer in mother 11/17/2016   Dyspareunia in female 11/11/2016   Screen for  colon cancer 11/11/2016   History of ovarian cyst 11/11/2016   Chronic hepatitis C without hepatic coma ([facility] 11/09/2016   Hyperlipidemia 10/07/2016   Insulin dependent type 2 diabetes mellitus ([facility] [place] 01/29/2014   Status post intraocular lens implant 11/08/2013   PCO (posterior capsular opacification) 09/28/2013   Pseudophakia 09/12/2013   GERD (gastroesophageal reflux disease) 09/06/2013   Hypertension 09/06/2013   BREAST PAIN, BILATERAL 02/19/2010   ANEMIA, IRON DEFICIENCY 10/03/2009   LEUKOPENIA, MILD 09/19/2009   LIPOMA 06/18/2009   EUSTACHIAN TUBE DYSFUNCTION 06/18/2009   TINEA PEDIS 05/07/2009   SKIN LESION 05/07/2009   COUGH 05/07/2009   SUBSTANCE ABUSE, MULTIPLE 02/22/2007   HCVD (hypertensive cardiovascular disease) 02/22/2007    Conditions to be addressed/monitored per PCP order:   chronic healthcare management needs, DM2, OSA, CHF, CKD, HLD, GERD, COPD, HTN  Care Plan : General Plan of Care (Adult)  Updates made by CGayla Medicus RN since 04/08/2021 12:00 AM     Problem: Quality  of Life (General Plan of Care)   Priority: Medium  Onset Date: 10/29/2020     Long-Range Goal: Quality of Life Maintained   Start Date: 10/29/2020  Expected End Date: 06/09/2021  Recent Progress: Not on track  Priority: Medium  Note:    CARE PLAN ENTRY Medicaid Managed Care (see longitudinal plan of care for additional care plan information)  Current Barriers:  Patient recently hospitalized 9/14-9/17/22.  Patient does not check her blood pressure.  She weighs herself occasionally and checks blood sugar every other day. Update 10/29/20:  Patient states leg swelling improved.  Needs follow up appointments with cardiologist and nephrologist. Update 11/28/20:  Patient denies any leg swelling-has not seen cardiologist or nephrologist-plans on scheduling appointment with PCP. Update 12/24/20:  Patient with an increase in leg swelling and trouble breathing-has used inhaler.  States she called PCP  office Friday and Monday and no one has called her back-RNCM called PCP office with patient on the phone for 3 way call-they are unable to see her today and directed her to the ER.  Patient still needs to schedule cardiology and nephrology appointment. Update 03/09/21:  Patient states she feels fine today, is getting ready to go on a cruise to the [place] for a week.  States leg swelling comes and goes and wears compression hose/stocking as needed. Update 04/08/21:  Patient without complaint today other than she has not received her medications from [facility].  States leg swelling comes and goes, elevates and wears compression hose/stockings as needed.  Nurse Case Manager Clinical Goal(s):  Over the next 30 days, patient will verbalize understanding of plan for decreased swelling.  Update 06/10/20:  patient states swelling has decreased with compression hose/stockings Over the next 30 days, patient will work with provider to address needs related to swelling.  Update 06/10/20:  patient was seen by her PCP, Dr. Margarita Rana, 06/03/20. Update 04/08/21:  patient has follow up appointment with Dr. Margarita Rana 04/21/21. Over the next 30 days, patient will schedule follow up appointments with cardiologist and nephrologist. Update 11/28/20:  Patient has not scheduled appointments with cardiologist or nephrologist yet-plans on scheduling appointment with PCP. Update 03/09/21:  Patient has follow up appointment 04/01/21 with Dr. Margarita Rana, no other appointments. Update 04/08/21:  Patient has cardiologist appointment 04/13/21. Over the next 30 days, patient will continue to work with Pharmacist. Update 11/28/20:  Pharmacy continues to follow. Update 12/24/20:  patient with increased leg swelling and SOB, was hospitalized approximately 2 weeks ago.  PCP office recommended patient proceed to ER. Update 03/09/21:  No complaints today.  Interventions:  Inter-disciplinary care team collaboration (see longitudinal plan of care) Reviewed  medications with patient. Discussed plans with patient for ongoing care management follow up and provided patient with direct contact information for care management team. Collaboration with Pharmacist for review of medications. Update 04/08/21:  Message to [facility] pharmacists regarding patient's medications.  Patient states she has not received her medications ordered in the hospital.  Plan:  Patient will follow up with provider and fill all prescriptions RNCM will follow up with patient within 30 days.   Follow Up:  Patient agrees to Care Plan and Follow-up.  Plan: The Managed Medicaid care management team will reach out to the patient again over the next 30 days. and The patient has been provided with contact information for the Managed Medicaid care management team and has been advised to call with any health related questions or concerns.  Date/time of next scheduled RN care management/care coordination outreach:  05/08/21 at 0900

## 2021-04-08 NOTE — Patient Instructions (Signed)
Hi [name] [name], thank you for speaking with me today, have a nice day.  [name] [name] was given information about Medicaid Managed Care team care coordination services as a part of their [place] Medicaid benefit. [name] [name] verbally consented to engagement with the [facility] Managed Care team.   If you are experiencing a medical emergency, please call 911 or report to your local emergency department or urgent care.   If you have a non-emergency medical problem during routine business hours, please contact your provider's office and ask to speak with a nurse.   For questions related to your [facility], please call: [phone] or visit the homepage here: [email or web address]  If you would like to schedule transportation through your [employer] [facility], please call the following number at least 2 days in advance of your appointment: [phone].   Call the [place] at [phone], at any time, 24 hours a day, 7 days a week. If you are in danger or need immediate medical attention call 911.  If you would like help to quit smoking, call 1-800-QUIT-NOW 605 562 0340) OR Espaol: 1-855-Djelo-Ya (4-854-627-0350) o para ms informacin haga clic aqu or Text READY to 200-400 to register via text  [name] [name] - following are the goals we discussed in your visit today:   Goals Addressed             This Visit's Progress    Protect My Health       Timeframe:  Long-Range Goal Priority:  Medium Start Date:          10/29/20                   Expected End Date: ongoing             Follow Up Date: 05/08/21   - schedule appointment for flu shot - schedule appointment for vaccines needed due to my age or health - schedule recommended health tests (blood work, mammogram, colonoscopy, pap test) - schedule and keep appointment for annual check-up   Update 11/28/20:  Patient to schedule follow up appointment with PCP. Update 12/24/20:  Patient has appointment 01/20/21 with PCP.  Needs to schedule appointment with cardiology and nephrology. Update 03/09/21:  Patient has follow up appointment with PCP 04/01/21. Update 04/08/21:  patient recently hospitalized 9/14-9/17.  Has follow up appointments with cardiologist and PCP.    Why is this important?   Screening tests can find diseases early when they are easier to treat.  Your doctor or nurse will talk with you about which tests are important for you.  Getting shots for common diseases like the flu and shingles will help prevent them.   The patient verbalized understanding of instructions provided today and declined a print copy of patient instruction materials.   The Managed Medicaid care management team will reach out to the patient again over the next 30 days.  The  Patient  has been provided with contact information for the Managed Medicaid care management team and has been advised to call with any health related questions or concerns.   [name] Raider RN, BSN [place]  [facility] - Managed Medicaid High Risk [phone].    Following is a copy of your plan of care:  Patient Care Plan: General Plan of Care (Adult)     Problem Identified: Quality of Life (General Plan of Care)   Priority: Medium  Onset Date: 10/29/2020  Long-Range Goal: Quality of Life Maintained   Start Date: 10/29/2020  Expected End Date: 06/09/2021  Recent Progress: Not on track  Priority: Medium  Note:    CARE PLAN ENTRY Medicaid Managed Care (see longitudinal plan of care for additional care plan information)  Current Barriers:  Patient recently hospitalized 9/14-9/17/22.  Patient does not check her blood pressure.  She weighs herself occasionally and checks blood sugar every other day. Update 10/29/20:  Patient states leg swelling improved.  Needs follow up  appointments with cardiologist and nephrologist. Update 11/28/20:  Patient denies any leg swelling-has not seen cardiologist or nephrologist-plans on scheduling appointment with PCP. Update 12/24/20:  Patient with an increase in leg swelling and trouble breathing-has used inhaler.  States she called PCP office Friday and Monday and no one has called her back-RNCM called PCP office with patient on the phone for 3 way call-they are unable to see her today and directed her to the ER.  Patient still needs to schedule cardiology and nephrology appointment. Update 03/09/21:  Patient states she feels fine today, is getting ready to go on a cruise to the [place] for a week.  States leg swelling comes and goes and wears compression hose/stocking as needed. Update 04/08/21:  Patient without complaint today other than she has not received her medications from [facility].  States leg swelling comes and goes, elevates and wears compression hose/stockings as needed.  Nurse Case Manager Clinical Goal(s):  Over the next 30 days, patient will verbalize understanding of plan for decreased swelling.  Update 06/10/20:  patient states swelling has decreased with compression hose/stockings Over the next 30 days, patient will work with provider to address needs related to swelling.  Update 06/10/20:  patient was seen by her PCP, [name] [name], 06/03/20. Update 04/08/21:  patient has follow up appointment with [name] [name] 04/21/21. Over the next 30 days, patient will schedule follow up appointments with cardiologist and nephrologist. Update 11/28/20:  Patient has not scheduled appointments with cardiologist or nephrologist yet-plans on scheduling appointment with PCP. Update 03/09/21:  Patient has follow up appointment 04/01/21 with [name] [name], no other appointments. Update 04/08/21:  Patient has cardiologist appointment 04/13/21. Over the next 30 days, patient will continue to work with Pharmacist. Update 11/28/20:  Pharmacy continues to  follow. Update 12/24/20:  patient with increased leg swelling and SOB, was hospitalized approximately 2 weeks ago.  PCP office recommended patient proceed to ER. Update 03/09/21:  No complaints today.  Interventions:  Inter-disciplinary care team collaboration (see longitudinal plan of care) Reviewed medications with patient. Discussed plans with patient for ongoing care management follow up and provided patient with direct contact information for care management team. Collaboration with Pharmacist for review of medications. Update 04/08/21:  Message to [facility] pharmacists regarding patient's medications.  Patient states she has not received her medications ordered in the hospital.  Plan:  Patient will follow up with provider and fill all prescriptions RNCM will follow up with patient within 30 days.

## 2021-04-13 ENCOUNTER — Other Ambulatory Visit: Payer: Self-pay

## 2021-04-13 ENCOUNTER — Ambulatory Visit (HOSPITAL_COMMUNITY)
Admit: 2021-04-13 | Discharge: 2021-04-13 | Disposition: A | Discharge status: Home / Self Care | Payer: Medicare Other | Attending: Internal Medicine | Admitting: Internal Medicine

## 2021-04-13 ENCOUNTER — Telehealth (HOSPITAL_COMMUNITY): Payer: Self-pay

## 2021-04-13 ENCOUNTER — Encounter (HOSPITAL_COMMUNITY): Payer: Self-pay

## 2021-04-13 VITALS — BP 182/96 | HR 101 | Wt 251.6 lb

## 2021-04-13 DIAGNOSIS — E1122 Type 2 diabetes mellitus with diabetic chronic kidney disease: Secondary | ICD-10-CM | POA: Diagnosis not present

## 2021-04-13 DIAGNOSIS — N184 Chronic kidney disease, stage 4 (severe): Secondary | ICD-10-CM | POA: Insufficient documentation

## 2021-04-13 DIAGNOSIS — Z9119 Patient's noncompliance with other medical treatment and regimen: Secondary | ICD-10-CM | POA: Diagnosis not present

## 2021-04-13 DIAGNOSIS — G4733 Obstructive sleep apnea (adult) (pediatric): Secondary | ICD-10-CM | POA: Diagnosis not present

## 2021-04-13 DIAGNOSIS — Z6841 Body Mass Index (BMI) 40.0 and over, adult: Secondary | ICD-10-CM | POA: Diagnosis not present

## 2021-04-13 DIAGNOSIS — J449 Chronic obstructive pulmonary disease, unspecified: Secondary | ICD-10-CM | POA: Insufficient documentation

## 2021-04-13 DIAGNOSIS — J9610 Chronic respiratory failure, unspecified whether with hypoxia or hypercapnia: Secondary | ICD-10-CM | POA: Insufficient documentation

## 2021-04-13 DIAGNOSIS — I130 Hypertensive heart and chronic kidney disease with heart failure and stage 1 through stage 4 chronic kidney disease, or unspecified chronic kidney disease: Secondary | ICD-10-CM | POA: Insufficient documentation

## 2021-04-13 DIAGNOSIS — Z794 Long term (current) use of insulin: Secondary | ICD-10-CM | POA: Diagnosis not present

## 2021-04-13 DIAGNOSIS — Z79899 Other long term (current) drug therapy: Secondary | ICD-10-CM | POA: Insufficient documentation

## 2021-04-13 DIAGNOSIS — Z7951 Long term (current) use of inhaled steroids: Secondary | ICD-10-CM | POA: Diagnosis not present

## 2021-04-13 DIAGNOSIS — N1831 Chronic kidney disease, stage 3a: Secondary | ICD-10-CM

## 2021-04-13 DIAGNOSIS — I5032 Chronic diastolic (congestive) heart failure: Secondary | ICD-10-CM | POA: Diagnosis not present

## 2021-04-13 LAB — BASIC METABOLIC PANEL
Anion gap: 11 (ref 5–15)
BUN: 45 mg/dL — ABNORMAL HIGH (ref 6–20)
CO2: 25 mmol/L (ref 22–32)
Calcium: 9.1 mg/dL (ref 8.9–10.3)
Chloride: 106 mmol/L (ref 98–111)
Creatinine, Ser: 1.98 mg/dL — ABNORMAL HIGH (ref 0.44–1.00)
GFR, Estimated: 28 mL/min — ABNORMAL LOW (ref >60)
Glucose, Bld: 111 mg/dL — ABNORMAL HIGH (ref 70–99)
Potassium: 3.9 mmol/L (ref 3.5–5.1)
Sodium: 142 mmol/L (ref 135–145)

## 2021-04-13 LAB — BRAIN NATRIURETIC PEPTIDE: B Natriuretic Peptide: 14.9 pg/mL (ref 0.0–100.0)

## 2021-04-13 MED ORDER — TORSEMIDE 40 MG PO TABS: ORAL_TABLET | ORAL | 6 refills | Status: DC

## 2021-04-13 NOTE — Progress Notes (Signed)
[facility]  PCP: [name], MD  Primary Cardiologist: Minus Breeding, MD   HPI:  [name] [name] is a 60 y.o.  female  with a PMH significant for chronic diastolic heart failure, hypertension, diabetes, CKD stage 4, morbid obesity, OSA, COPD.  Has had about one admission per year for HFpEF since 2020.  Also COPD admission 11/2020.  Several ED visits for the same. PCP ordered PFT's 02/2021 but patient has not yet completed.    04/01/2021 admitted for 2 weeks of progressively worsening dyspnea on exertion and 15 pound weight gain in 1 week refractory to home oral diuretic but with apparently intermittent use of antihypertensive therapy.  Presented to [facility] ED.  Her workup there was sig for normal Hs trop, ecg without acute ischemia and in NSR.  BNP 92 baseline around 20-30.  Chest x-ray without acute process.  Normal bp on arrival.  She was started on IV diuretic.  GDMT titration was limited mainly by CKD 3b.  Wt trend 262>257lbs.  ECHO was repeated 04/02/21 EF 65-70%, mild-mod TR, Moderately elevated PASP estimate, normal IVC.    Here for hospital FU.  Still having some dyspnea with going up stairs.  Doing better on flat ground since hospitalization.  Can walk to the mailbox but when she returns has trouble breathing.  No bp cuff at home.  Not weighing herself but does have a scale.  CPAP machine retrieved by company about 3 months ago due to poor adherence.  Family is here with her today.  They are concerned about her lack of effort in regards to maintaining her health.  Daughter in the process of moving here from [place] in order to help take care of her.  Took her medications late today before coming to the visit.  Hasn't received carvedilol yet and not taking labetalol but tells me she is taking all other medications.    ROS: All systems negative except as listed in HPI, PMH and Problem List.  SH:  Social History   Socioeconomic History   Marital  status: Legally Separated    Spouse name: Not on file   Number of children: 1   Years of education: Not on file   Highest education level: 9th grade  Occupational History   Occupation: disability  Tobacco Use   Smoking status: Never   Smokeless tobacco: Never  Vaping Use   Vaping Use: Never used  Substance and Sexual Activity   Alcohol use: No   Drug use: Not Currently    Types: Cocaine, Marijuana    Comment: remote h/o cocaine 2015 and marijuana 2018   Sexual activity: Not Currently  Other Topics Concern   Not on file  Social History Narrative   Lives with friend, Rosalee Kaufman and nephew.  One daughter.    Social Determinants of Health   Financial Resource Strain: Low Risk    Difficulty of Paying Living Expenses: Not very hard  Food Insecurity: No Food Insecurity   Worried About Charity fundraiser in the Last Year: Never true   Arboriculturist in the Last Year: Never true  Transportation Needs: Unmet Transportation Needs   Lack of Transportation (Medical): No   Lack of Transportation (Non-Medical): Yes  Physical Activity: Inactive   Days of Exercise per Week: 0 days   Minutes of Exercise per Session: 0 min  Stress: Not on file  Social Connections: Moderately Isolated   Frequency of Communication with Friends and Family: More  than three times a week   Frequency of Social Gatherings with Friends and Family: More than three times a week   Attends Religious Services: More than 4 times per year   Active Member of Clubs or Organizations: No   Attends Archivist Meetings: Never   Marital Status: Separated  Intimate Partner Violence: Not At Risk   Fear of Current or Ex-Partner: No   Emotionally Abused: No   Physically Abused: No   Sexually Abused: No    FH:  Family History  Problem Relation Age of Onset   Colon cancer Mother    Liver disease Sister    Other Neg Hx    Breast cancer Neg Hx    Esophageal cancer Neg Hx    Rectal cancer Neg Hx     Past Medical  History:  Diagnosis Date   Anemia    CHF (congestive heart failure) ([place])    Chronic hepatitis C without hepatic coma ([place]) 11/09/2016   Diabetes mellitus    Fibroids    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 90 tablet 3   atorvastatin (LIPITOR) 40 MG tablet Take 1 tablet (40 mg total) by mouth every morning. Must have HF appt for further refills 90 tablet 0   ferrous sulfate 325 (65 FE) MG tablet Take 325 mg by mouth daily with breakfast.     hydrALAZINE (APRESOLINE) 50 MG tablet Take 1 tablet (50 mg total) by mouth 3 (three) times daily. 180 tablet 1   LANTUS SOLOSTAR 100 UNIT/ML Solostar Pen INJECT 60 UNITS SUBCUTANEOUSLY EVERYDAY AT BEDTIME (Patient taking differently: Inject 60 Units into the skin at bedtime.) 15 mL 0   pregabalin (LYRICA) 75 MG capsule TAKE ONE CAPSULE BY MOUTH EVERY MORNING and TAKE ONE CAPSULE BY MOUTH EVERY EVENING (Patient taking differently: Take 75 mg by mouth 2 (two) times daily.) 60 capsule 4   albuterol (PROVENTIL) (2.5 MG/3ML) 0.083% nebulizer solution Take 3 mLs (2.5 mg total) by nebulization every 4 (four) hours as needed for wheezing or shortness of breath. (Patient not taking: Reported on 04/13/2021) 75 mL 0   albuterol (VENTOLIN HFA) 108 (90 Base) MCG/ACT inhaler Inhale 1 puff into the lungs every 4 (four) hours as needed for wheezing or shortness of breath. (Patient not taking: Reported on 04/13/2021) 18 g 0   Artificial Tear Ointment (DRY EYES OP) Place 1 drop into both eyes daily as needed (dry eyes). (Patient not taking: Reported on 04/13/2021)     blood glucose meter kit and supplies KIT Dispense based  on patient and insurance preference. Use up to four times daily as directed. (FOR ICD-9 250.00, 250.01). (Patient not taking: Reported on 04/13/2021) 1 each 2   carvedilol (COREG) 6.25 MG tablet Take 1 tablet (6.25 mg total) by mouth 2 (two) times daily with a meal. (Patient not taking: Reported on 04/13/2021) 60 tablet 0   doxycycline (VIBRAMYCIN) 100 MG capsule Take 1 capsule (100 mg total) by mouth 2 (two) times daily. (Patient not taking: No sig reported) 14 capsule 0   fluticasone (FLOVENT HFA) 110 MCG/ACT inhaler Inhale 2  puffs into the lungs 2 (two) times daily. 1 each 3   metroNIDAZOLE (FLAGYL) 500 MG tablet Take 1 tablet (500 mg total) by mouth 2 (two) times daily for 7 days. 14 tablet 0   potassium chloride SA (KLOR-CON) 20 MEQ tablet Take 1 tablet (20 mEq total) by mouth daily. (Patient not taking: Reported on 04/13/2021) 30 tablet 0   Torsemide 40 MG TABS Take 40 mg by mouth daily for 5 days, THEN 40 mg every other day. 45 tablet 6   VICTOZA 18 MG/3ML SOPN INJECT 1.40m into THE SKIN DAILY AS DIRECTED (Patient not taking: Reported on 04/13/2021) 9 mL 0   Vitamin D, Ergocalciferol, (DRISDOL) 1.25 MG (50000 UNIT) CAPS capsule Take 50,000 Units by mouth every Monday. (Patient not taking: Reported on 04/13/2021)     No current facility-administered medications for this encounter.    Vitals:   04/13/21 1503  BP: (!) 182/96  Pulse: (!) 101  SpO2: 96%  Weight: 114.1 kg (251 lb 9.6 oz)    PHYSICAL EXAM: Cardiac: JVD 10cm, mild tachycardia but regular, clear s1 and s2, no murmurs, rubs or gallops, Trace-1+ LE edema Pulmonary: Distant breath sounds with fine rales in bases, not in distress Abdominal: non distended abdomen, soft and nontender Psych: Alert, conversant, in good spirits   ASSESSMENT & PLAN: Chronic HFpEF  -04/02/21 EF 65-70%, mild-mod TR, Moderately elevated PASP estimate, normal IVC.  -Hx of poor medication adherence -NYHA Class III, remains volume up on exam but improvement  in weight and subjectively her edema by her and her family.  She is also down 6lb since hospitalization -Reports brisk output with torsemide at home, can likely benefit from one more week of daily 477mthen move to every other day  -Continue torsemide 4017maily, coreg 6.25 mg twice daily, hydralazine 50 mg TID, amlodipine 10 mg daily, avoiding SGLT2i due to borderline GFR and high risk with current and hx  of urogenital infections.  Not started on ARB/ARNI or SPIRO also due to CKD4. -BP/HR high today but not taking all her medications and took them late today, on chart review had good control during hospitalization.  Has not received her carvedilol yet so not taking any BB.  Instructed she can take labetalol until carvedilol arrives.    T2DM -now well controlled -avoiding SGLT2i with low GFR and high risk with current and hx of urogenital infections  Chronic Respiratory Failure OSA -recommend she complete PFT's ordered by PCP -They took her cpap 3 months ago due to poor adherence, she will discuss trying to get it back with her PCP will likely need repeat sleep study  CKD4  -repeat bmp today -GFR borderline for finerenone, sglt2i, likely too advanced to get much benefit from ARB/ARNI either   Follow up w/[facility] cardiology

## 2021-04-13 NOTE — Patient Instructions (Addendum)
CONTINUE Torsemide to 40 mg daily for 5 days, then resume new normal dose of 40 mg every other day.  CONTINUE Labetalol until Coreg arrives from pharmacy  Be sure to take Potassium 20 meq with every dose of Torsemide  Labs today We will only contact you if something comes back abnormal or we need to make some changes. Otherwise no news is good news!  Keep cardiology follow up as scheduled.   Do the following things EVERYDAY: Weigh yourself in the morning before breakfast. Write it down and keep it in a log. Take your medicines as prescribed Eat low salt foods--Limit salt (sodium) to 2000 mg per day.  Stay as active as you can everyday Limit all fluids for the day to less than 2 liters

## 2021-04-13 NOTE — Telephone Encounter (Signed)
Called to confirm Heart & Vascular Transitions of Care appointment at 3pm today. Patient reminded to bring all medications and pill box organizer with them. Confirmed patient has transportation. Gave directions, instructed to utilize [facility] parking.  Confirmed appointment prior to ending call.   Pricilla Holm, MSN, RN Heart Failure Nurse Navigator [phone]

## 2021-04-14 ENCOUNTER — Encounter (HOSPITAL_COMMUNITY): Payer: Self-pay

## 2021-04-14 ENCOUNTER — Other Ambulatory Visit: Payer: Self-pay | Admitting: Family Medicine

## 2021-04-14 DIAGNOSIS — I5032 Chronic diastolic (congestive) heart failure: Secondary | ICD-10-CM

## 2021-04-14 DIAGNOSIS — E1149 Type 2 diabetes mellitus with other diabetic neurological complication: Secondary | ICD-10-CM

## 2021-04-14 DIAGNOSIS — I110 Hypertensive heart disease with heart failure: Secondary | ICD-10-CM

## 2021-04-14 DIAGNOSIS — Z794 Long term (current) use of insulin: Secondary | ICD-10-CM

## 2021-04-14 NOTE — Telephone Encounter (Signed)
Requested medications are due for refill today.  unknown  Requested medications are on the active medications list.  no  Last refill. 03/13/2021  Future visit scheduled.   yes  Notes to clinic.  This medication was to be discontinued at discharge. Medication was discontinued 04/04/2021 by Dr. [name].

## 2021-04-21 ENCOUNTER — Other Ambulatory Visit: Payer: Self-pay

## 2021-04-21 ENCOUNTER — Ambulatory Visit: Payer: Medicare Other | Attending: Family Medicine | Admitting: Family Medicine

## 2021-04-21 VITALS — BP 154/83 | HR 87 | Ht 64.0 in | Wt 251.0 lb

## 2021-04-21 DIAGNOSIS — E11649 Type 2 diabetes mellitus with hypoglycemia without coma: Secondary | ICD-10-CM | POA: Diagnosis not present

## 2021-04-21 DIAGNOSIS — I5032 Chronic diastolic (congestive) heart failure: Secondary | ICD-10-CM

## 2021-04-21 DIAGNOSIS — Z794 Long term (current) use of insulin: Secondary | ICD-10-CM

## 2021-04-21 DIAGNOSIS — E11311 Type 2 diabetes mellitus with unspecified diabetic retinopathy with macular edema: Secondary | ICD-10-CM | POA: Diagnosis not present

## 2021-04-21 DIAGNOSIS — E1149 Type 2 diabetes mellitus with other diabetic neurological complication: Secondary | ICD-10-CM

## 2021-04-21 DIAGNOSIS — R0609 Other forms of dyspnea: Secondary | ICD-10-CM

## 2021-04-21 DIAGNOSIS — J9611 Chronic respiratory failure with hypoxia: Secondary | ICD-10-CM

## 2021-04-21 DIAGNOSIS — I129 Hypertensive chronic kidney disease with stage 1 through stage 4 chronic kidney disease, or unspecified chronic kidney disease: Secondary | ICD-10-CM

## 2021-04-21 DIAGNOSIS — I110 Hypertensive heart disease with heart failure: Secondary | ICD-10-CM

## 2021-04-21 DIAGNOSIS — E1122 Type 2 diabetes mellitus with diabetic chronic kidney disease: Secondary | ICD-10-CM

## 2021-04-21 MED ORDER — MISC. DEVICES MISC: 0 refills | Status: DC

## 2021-04-21 MED ORDER — LANTUS SOLOSTAR 100 UNIT/ML ~~LOC~~ SOPN: 58.0000 [IU] | PEN_INJECTOR | Freq: Every day | SUBCUTANEOUS | 6 refills | Status: DC

## 2021-04-21 MED ORDER — SPIRONOLACTONE 25 MG PO TABS: 25.0000 mg | ORAL_TABLET | Freq: Every day | ORAL | 1 refills | Status: DC

## 2021-04-21 NOTE — Progress Notes (Signed)
Subjective:  Patient ID: [name] [name], female    DOB: 1960/10/27  Age: 60 y.o. MRN: [id number]  CC: Hospitalization Follow-up   HPI [name] [name] is a 60 y.o. year old female with a history of type 2 diabetes mellitus (A1c 6.6), diabetic neuropathy hypertension, diabetic retinopathy, hyperlipidemia, hepatitis C (completed treatment with harvoni), OSA, diastolic CHF (EF 55 to 47%), stage III CKD. Hospitalized at [facility] from 04/01/2021 through 04/04/2021 for CHF exacerbation.  Furosemide was switched to torsemide and labetalol switched to Coreg.  Interval History: Hospitalized at [facility] from 04/01/2021 through 04/04/2021 for CHF exacerbation.  Furosemide was switched to torsemide and labetalol switched to Coreg. Echo revealed: IMPRESSIONS   1. Left ventricular ejection fraction, by estimation, is 65 to 70%. The  left ventricle has normal function. The left ventricle has no regional  wall motion abnormalities. Left ventricular diastolic parameters are  indeterminate.   2. Right ventricular systolic function is normal. The right ventricular  size is normal. There is moderately elevated pulmonary artery systolic  pressure.   3. The mitral valve is normal in structure. Trivial mitral valve  regurgitation.   4. Tricuspid valve regurgitation is mild to moderate.   5. The aortic valve is normal in structure. Aortic valve regurgitation is  not visualized.   6. The inferior vena cava is normal in size with greater than 50%  respiratory variability, suggesting right atrial pressure of 3 mmHg.    She had a hospital follow-up with cardiology 04/13/2021; notes reviewed.  She is currently on potassium pills. She complains the potassium pills are too big and she has not been compliant with it.  Her blood pressure is elevated and she is unsure if she took all her medications this morning.  States she woke up late and was rushing for this appointment. Her oxygen saturation is  88% on room air. Denies presence of chest pain but does have some dyspnea.  Her weight is back to baseline  Sometimes has hypoglycemia in the mornings.  She tries as much as possible to work on administering her Lantus at night. Additional history obtained from daughter who was on the phone throughout the whole encounter Past Medical History:  Diagnosis Date   Anemia    CHF (congestive heart failure) ([place])    Chronic hepatitis C without hepatic coma ([place]) 11/09/2016   Diabetes mellitus    Fibroids    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   Qualifier: Diagnosis of  By: [name] [name], [name]     Hypertension    MRSA (methicillin resistant Staphylococcus aureus)    Trichomonas    VAGINITIS, BACTERIAL, RECURRENT 08/15/2007   Qualifier: Diagnosis of  By: [name] [name], [name] [name]      Past Surgical History:  Procedure Laterality Date   BREAST BIOPSY Left 2018   CESAREAN SECTION     breech    Family History  Problem Relation Age of Onset   Colon cancer Mother    Liver disease Sister    Other Neg Hx    Breast cancer Neg Hx    Esophageal cancer Neg Hx    Rectal cancer Neg Hx     Allergies  Allergen Reactions   Lisinopril Swelling    Outpatient Medications Prior to Visit  Medication Sig Dispense Refill   albuterol (PROVENTIL) (2.5 MG/3ML) 0.083% nebulizer solution Take 3 mLs (2.5 mg total) by nebulization every 4 (four) hours as needed for wheezing or shortness of breath. 75 mL 0   albuterol (VENTOLIN HFA) 108 (  90 Base) MCG/ACT inhaler Inhale 1 puff into the lungs every 4 (four) hours as needed for wheezing or shortness of breath. 18 g 0   amLODipine (NORVASC) 10 MG tablet Take 1 tablet (10 mg total) by mouth daily. 90 tablet 3   Artificial Tear Ointment (DRY EYES OP) Place 1 drop into both eyes daily as needed (dry eyes).     atorvastatin (LIPITOR) 40 MG tablet Take 1 tablet (40 mg total) by mouth every morning. Must have HF appt for further refills 90 tablet 0   blood glucose meter kit and supplies KIT  Dispense based on patient and insurance preference. Use up to four times daily as directed. (FOR ICD-9 250.00, 250.01). 1 each 2   carvedilol (COREG) 6.25 MG tablet Take 1 tablet (6.25 mg total) by mouth 2 (two) times daily with a meal. 60 tablet 0   doxycycline (VIBRAMYCIN) 100 MG capsule Take 1 capsule (100 mg total) by mouth 2 (two) times daily. 14 capsule 0   ferrous sulfate 325 (65 FE) MG tablet Take 325 mg by mouth daily with breakfast.     fluticasone (FLOVENT HFA) 110 MCG/ACT inhaler Inhale 2 puffs into the lungs 2 (two) times daily. 1 each 3   hydrALAZINE (APRESOLINE) 50 MG tablet Take 1 tablet (50 mg total) by mouth 3 (three) times daily. 180 tablet 1   LANTUS SOLOSTAR 100 UNIT/ML Solostar Pen INJECT 60 UNITS SUBCUTANEOUSLY EVERYDAY AT BEDTIME (Patient taking differently: Inject 60 Units into the skin at bedtime.) 15 mL 0   potassium chloride SA (KLOR-CON) 20 MEQ tablet Take 1 tablet (20 mEq total) by mouth daily. 30 tablet 0   pregabalin (LYRICA) 75 MG capsule TAKE ONE CAPSULE BY MOUTH EVERY MORNING and TAKE ONE CAPSULE BY MOUTH EVERY EVENING (Patient taking differently: Take 75 mg by mouth 2 (two) times daily.) 60 capsule 4   Torsemide 40 MG TABS Take 40 mg by mouth daily for 5 days, THEN 40 mg every other day. 45 tablet 6   VICTOZA 18 MG/3ML SOPN INJECT 1.8 MG into THE SKIN DAILY AS DIRECTED 9 mL 0   Vitamin D, Ergocalciferol, (DRISDOL) 1.25 MG (50000 UNIT) CAPS capsule Take 50,000 Units by mouth every Monday.     No facility-administered medications prior to visit.     ROS Review of Systems  Constitutional:  Negative for activity change, appetite change and fatigue.  HENT:  Negative for congestion, sinus pressure and sore throat.   Eyes:  Negative for visual disturbance.  Respiratory:  Positive for shortness of breath (on exertion). Negative for cough, chest tightness and wheezing.   Cardiovascular:  Negative for chest pain and palpitations.  Gastrointestinal:  Negative for  abdominal distention, abdominal pain and constipation.  Endocrine: Negative for polydipsia.  Genitourinary:  Negative for dysuria and frequency.  Musculoskeletal:  Negative for arthralgias and back pain.  Skin:  Negative for rash.  Neurological:  Negative for tremors, light-headedness and numbness.  Hematological:  Does not bruise/bleed easily.  Psychiatric/Behavioral:  Negative for agitation and behavioral problems.    Objective:  BP (!) 154/83   Pulse 87   Ht '5\' 4"'  (1.626 m)   Wt 251 lb (113.9 kg)   LMP 04/01/2010   SpO2 (!) 88%   BMI 43.08 kg/m   BP/Weight 04/21/2021 04/13/2021 7/65/4650  Systolic BP 354 656 812  Diastolic BP 83 96 73  Wt. (Lbs) 251 251.6 -  BMI 43.08 43.19 -  Some encounter information is confidential and restricted. Go to  Review Flowsheets activity to see all data.      Physical Exam Constitutional:      Appearance: She is well-developed. She is obese.  Cardiovascular:     Rate and Rhythm: Normal rate.     Heart sounds: Normal heart sounds. No murmur heard. Pulmonary:     Effort: Pulmonary effort is normal.     Breath sounds: Normal breath sounds. No wheezing or rales.  Chest:     Chest wall: No tenderness.  Abdominal:     General: Bowel sounds are normal. There is no distension.     Palpations: Abdomen is soft. There is no mass.     Tenderness: There is no abdominal tenderness.  Musculoskeletal:        General: Normal range of motion.     Right lower leg: No edema.     Left lower leg: No edema.  Neurological:     Mental Status: She is alert and oriented to person, place, and time.  Psychiatric:        Mood and Affect: Mood normal.    CMP Latest Ref Rng & Units 04/13/2021 04/04/2021 04/03/2021  Glucose 70 - 99 mg/dL 111(H) 109(H) 150(H)  BUN 6 - 20 mg/dL 45(H) 46(H) 44(H)  Creatinine 0.44 - 1.00 mg/dL 1.98(H) 2.12(H) 2.29(H)  Sodium 135 - 145 mmol/L 142 140 141  Potassium 3.5 - 5.1 mmol/L 3.9 3.7 3.3(L)  Chloride 98 - 111 mmol/L 106 106 105   CO2 22 - 32 mmol/L '25 24 24  ' Calcium 8.9 - 10.3 mg/dL 9.1 8.8(L) 8.8(L)  Total Protein 6.5 - 8.1 g/dL - - -  Total Bilirubin 0.3 - 1.2 mg/dL - - -  Alkaline Phos 38 - 126 U/L - - -  AST 15 - 41 U/L - - -  ALT 0 - 44 U/L - - -    Lipid Panel     Component Value Date/Time   CHOL 147 01/29/2019 1118   TRIG 102 01/29/2019 1118   HDL 59 01/29/2019 1118   CHOLHDL 2.5 01/29/2019 1118   CHOLHDL 3 06/18/2014 0951   VLDL 19.6 06/18/2014 0951   LDLCALC 68 01/29/2019 1118    CBC    Component Value Date/Time   WBC 3.9 (L) 04/04/2021 0255   RBC 3.13 (L) 04/04/2021 0255   HGB 8.6 (L) 04/04/2021 0255   HGB 9.8 (L) 07/10/2019 1544   HCT 27.5 (L) 04/04/2021 0255   HCT 30.9 (L) 07/10/2019 1544   PLT 145 (L) 04/04/2021 0255   PLT 171 07/10/2019 1544   MCV 87.9 04/04/2021 0255   MCV 86 07/10/2019 1544   MCH 27.5 04/04/2021 0255   MCHC 31.3 04/04/2021 0255   RDW 14.2 04/04/2021 0255   RDW 12.7 07/10/2019 1544   LYMPHSABS 1.0 04/02/2021 1200   LYMPHSABS 1.3 07/10/2019 1544   MONOABS 0.3 04/02/2021 1200   EOSABS 0.2 04/02/2021 1200   EOSABS 0.0 07/10/2019 1544   BASOSABS 0.0 04/02/2021 1200   BASOSABS 0.0 07/10/2019 1544    Lab Results  Component Value Date   HGBA1C 6.0 (H) 04/02/2021    Assessment & Plan:  1. Chronic respiratory failure with hypoxia (HCC) Oxygen saturation is 88% at rest She will benefit from oxygen supplementation Order has been sent to [facility]. Devices MISC; 2 L of oxygen via nasal cannula.  Diagnosis-chronic respiratory failure with hypoxia  Dispense: 1 each; Refill: 0  2. Type 2 diabetes mellitus with other neurologic complication, with long-term current use of  insulin ([place]) Controlled with A1c of 6.0 She has had some hypoglycemic episodes hence I have decreased Lantus to 58 units Counseled on Diabetic diet, my plate method, 720 minutes of moderate intensity exercise/week Blood sugar logs with fasting goals of 80-120 mg/dl, random of less than  180 and in the event of sugars less than 60 mg/dl or greater than 400 mg/dl encouraged to notify the clinic. Advised on the need for annual eye exams, annual foot exams, Pneumonia vaccine. - insulin glargine (LANTUS SOLOSTAR) 100 UNIT/ML Solostar Pen; Inject 58 Units into the skin daily.  Dispense: 30 mL; Refill: 6  3. 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  Dispense: 30 mL; Refill: 6  4. Type 2 diabetes mellitus with hypoglycemia without coma, with long-term current use of insulin (HCC) Her concomitant chronic kidney disease places her at risk for hypoglycemia Lantus dose has been decreased  5. Hypertensive heart disease with chronic diastolic congestive heart failure (HCC) High risk patient with multiple ED visits and hospitalization for CHF Compliance playing a huge role Her daughter is working on moving here from [place]. EF 65%, no evidence of fluid overload Spironolactone added to regimen Advised to discontinue potassium as she has been noncompliant due to the size of the pills Continue guideline directed medical therapy - spironolactone (ALDACTONE) 25 MG tablet; Take 1 tablet (25 mg total) by mouth daily.  Dispense: 90 tablet; Refill: 1  6. 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 ([place]) Uncontrolled Spironolactone added to regimen Counseled on blood pressure goal of less than 130/80, low-sodium, DASH diet, medication compliance, 150 minutes of moderate intensity exercise per week. Discussed medication compliance, adverse effects.   7. Other form of dyspnea Will need to exclude other underlying causes of dyspnea especially in the setting  of hypoxia of 88% I previously ordered PFTs which she never followed through with We will call the PFT lab to reschedule this for her. - Pulmonary function test     Meds ordered this encounter  Medications   Misc. Devices MISC    Sig: 2 L of oxygen via nasal cannula.  Diagnosis-chronic respiratory failure with hypoxia    Dispense:  1 each    Refill:  0    Return in about 2 weeks (around 05/05/2021) for Follow-up on hypertension and potassium.    52 minutes of total face to face time spent including median intraservice time reviewing previous histories and test results, ordering medications and investigations, documenting in the chart, counseling patient on new diagnosis of chronic respiratory failure with hypoxia need for additional work-up for hypoxia, discussing chronic medical conditions and her need for compliance, investigations, treatment plan.  All questions were answered to the patient's satisfaction    [name], [name], FAAFP. [facility] and [place], [place]   04/21/2021, 11:39 AM

## 2021-04-21 NOTE — Patient Instructions (Signed)
Heart Failure Eating Plan Heart failure, also called congestive heart failure, occurs when your heart does not pump blood well enough to meet your body's needs for oxygen-rich blood. Heart failure is a long-term (chronic) condition. Living with heart failure can be challenging. Following your health care provider's instructions about a healthy lifestyle and working with a dietitian to choose the right foods may help to improve your symptoms. An eating plan for someone with heart failure will include changes that limit the intake of salt (sodium) and unhealthy fat. What are tips for following this plan? Reading food labels Check food labels for the amount of sodium per serving. Choose foods that have less than 140 mg (milligrams) of sodium in each serving. Check food labels for the number of calories per serving. This is important if you need to limit your daily calorie intake to lose weight. Check food labels for the serving size. If you eat more than one serving, you will be eating more sodium and calories than what is listed on the label. Look for foods that are labeled as "sodium-free," "very low sodium," or "low sodium." Foods labeled as "reduced sodium" or "lightly salted" may still have more sodium than what is recommended for you. Cooking Avoid adding salt when cooking. Ask your health care provider or dietitian before using salt substitutes. Season food with salt-free seasonings, spices, or herbs. Check the label of seasoning mixes to make sure they do not contain salt. Cook with heart-healthy oils, such as olive, canola, soybean, or sunflower oil. Do not fry foods. Cook foods using low-fat methods, such as baking, boiling, grilling, and broiling. Limit unhealthy fats when cooking by: Removing the skin from poultry, such as chicken. Removing all visible fats from meats. Skimming the fat off from stews, soups, and gravies before serving them. Meal planning  Limit your intake  of: Processed, canned, or prepackaged foods. Foods that are high in trans fat, such as fried foods. Sweets, desserts, sugary drinks, and other foods with added sugar. Full-fat dairy products, such as whole milk. Eat a balanced diet. This may include: 4-5 servings of fruit each day and 4-5 servings of vegetables each day. At each meal, try to fill one-half of your plate with fruits and vegetables. Up to 6-8 servings of whole grains each day. Up to 2 servings of lean meat, poultry, or fish each day. One serving of meat is equal to 3 oz (85 g). This is about the same size as a deck of cards. 2 servings of low-fat dairy each day. Heart-healthy fats. Healthy fats called omega-3 fatty acids are found in foods such as flaxseed and cold-water fish like sardines, salmon, and mackerel. Aim to eat 25-35 g (grams) of fiber a day. Foods that are high in fiber include apples, broccoli, carrots, beans, peas, and whole grains. Do not add salt or condiments that contain salt (such as soy sauce) to foods before eating. When eating at a restaurant, ask that your food be prepared with less salt or no salt, if possible. Try to eat 2 or more vegetarian meals each week. Eat more home-cooked food and eat less restaurant, buffet, and fast food. General information Do not eat more than 2,300 mg of sodium a day. The amount of sodium that is recommended for you may be lower, depending on your condition. Maintain a healthy body weight as directed. Ask your health care provider what a healthy weight is for you. Check your weight every day. Work with your health care provider   and dietitian to make a plan that is right for you to lose weight or maintain your current weight. Limit how much fluid you drink. Ask your health care provider or dietitian how much fluid you can have each day. Limit or avoid alcohol as told by your health care provider or dietitian. Recommended foods Fruits All fresh, frozen, and canned fruits.  Dried fruits, such as raisins, prunes, and cranberries. Vegetables All fresh vegetables. Vegetables that are frozen without sauce or added salt. Low-sodium or sodium-free canned vegetables. Grains Bread with less than 80 mg of sodium per slice. Whole-wheat pasta, quinoa, and brown rice. Oats and oatmeal. Barley. Millet. Grits and cream of wheat. Whole-grain and whole-wheat cold cereal. Meats and other protein foods Lean cuts of meat. Skinless chicken and turkey. Fish with high omega-3 fatty acids, such as salmon, sardines, and other cold-water fishes. Eggs. Dried beans, peas, and edamame. Unsalted nuts and nut butters. Dairy Low-fat or nonfat (skim) milk and dried milk. Rice milk, soy milk, and almond milk. Low-fat or nonfat yogurt. Small amounts of reduced-sodium block cheese. Low-sodium cottage cheese. Fats and oils Olive, canola, soybean, flaxseed, avocado, or sunflower oil. Sweets and desserts Applesauce. Granola bars. Sugar-free pudding and gelatin. Frozen fruit bars. Seasoning and other foods Fresh and dried herbs. Lemon or lime juice. Vinegar. Low-sodium ketchup. Salt-free marinades, salad dressings, sauces, and seasonings. The items listed above may not be a complete list of foods and beverages you can eat. Contact a dietitian for more information. Foods to avoid Fruits Fruits that are dried with sodium-containing preservatives. Vegetables Canned vegetables. Frozen vegetables with sauce or seasonings. Creamed vegetables. French fries. Onion rings. Pickled vegetables and sauerkraut. Grains Bread with more than 80 mg of sodium per slice. Hot or cold cereal with more than 140 mg sodium per serving. Salted pretzels and crackers. Prepackaged breadcrumbs. Bagels, croissants, and biscuits. Meats and other protein foods Ribs and chicken wings. Bacon, ham, pepperoni, bologna, salami, and packaged luncheon meats. Hot dogs, bratwurst, and sausage. Canned meat. Smoked meat and fish. Salted nuts  and seeds. Dairy Whole milk, half-and-half, and cream. Buttermilk. Processed cheese, cheese spreads, and cheese curds. Regular cottage cheese. Feta cheese. Shredded cheese. String cheese. Fats and oils Butter, lard, shortening, ghee, and bacon fat. Canned and packaged gravies. Seasoning and other foods Onion salt, garlic salt, table salt, and sea salt. Marinades. Regular salad dressings. Relishes, pickles, and olives. Meat flavorings and tenderizers, and bouillon cubes. Horseradish, ketchup, and mustard. Worcestershire sauce. Teriyaki sauce, soy sauce (including reduced sodium). Hot sauce and Tabasco sauce. Steak sauce, fish sauce, oyster sauce, and cocktail sauce. Taco seasonings. Barbecue sauce. Tartar sauce. The items listed above may not be a complete list of foods and beverages you should avoid. Contact a dietitian for more information. Summary A heart failure eating plan includes changes that limit your intake of sodium and unhealthy fat, and it may help you lose weight or maintain a healthy weight. Your health care provider may also recommend limiting how much fluid you drink. Most people with heart failure should eat no more than 2,300 mg of salt (sodium) a day. The amount of sodium that is recommended for you may be lower, depending on your condition. Contact your health care provider or dietitian before making any major changes to your diet. This information is not intended to replace advice given to you by your health care provider. Make sure you discuss any questions you have with your health care provider. Document Revised: 02/18/2020 Document Reviewed: 02/18/2020   Elsevier Patient Education  2022 [employer] Inc.  

## 2021-04-21 NOTE — Progress Notes (Signed)
Discuss new medication from hospital.

## 2021-04-22 ENCOUNTER — Encounter: Payer: Self-pay | Admitting: Family Medicine

## 2021-04-23 ENCOUNTER — Other Ambulatory Visit: Payer: Self-pay | Admitting: Family Medicine

## 2021-04-23 DIAGNOSIS — I110 Hypertensive heart disease with heart failure: Secondary | ICD-10-CM

## 2021-04-24 NOTE — Telephone Encounter (Signed)
Requested medications are due for refill today NO  Requested medications are on the active medication list NO  Last refill 03/17/21  Last visit 04/21/21  Future visit scheduled 05/05/21  Notes to clinic Not on  current med list, "stop taking at discharge."

## 2021-04-30 DIAGNOSIS — E113511 Type 2 diabetes mellitus with proliferative diabetic retinopathy with macular edema, right eye: Secondary | ICD-10-CM | POA: Diagnosis not present

## 2021-04-30 DIAGNOSIS — E113513 Type 2 diabetes mellitus with proliferative diabetic retinopathy with macular edema, bilateral: Secondary | ICD-10-CM | POA: Diagnosis not present

## 2021-04-30 DIAGNOSIS — Z961 Presence of intraocular lens: Secondary | ICD-10-CM | POA: Diagnosis not present

## 2021-05-04 ENCOUNTER — Other Ambulatory Visit: Payer: Self-pay | Admitting: Family Medicine

## 2021-05-05 ENCOUNTER — Inpatient Hospital Stay (HOSPITAL_BASED_OUTPATIENT_CLINIC_OR_DEPARTMENT_OTHER)
Admission: EM | Admit: 2021-05-05 | Discharge: 2021-05-07 | DRG: 177 | Disposition: A | Discharge status: Home / Self Care | Payer: Medicaid Other | Attending: Internal Medicine | Admitting: Internal Medicine

## 2021-05-05 ENCOUNTER — Other Ambulatory Visit: Payer: Self-pay

## 2021-05-05 ENCOUNTER — Ambulatory Visit: Payer: Self-pay

## 2021-05-05 ENCOUNTER — Ambulatory Visit: Payer: Medicare Other | Attending: Family Medicine | Admitting: Family Medicine

## 2021-05-05 ENCOUNTER — Emergency Department (HOSPITAL_BASED_OUTPATIENT_CLINIC_OR_DEPARTMENT_OTHER): Payer: Medicaid Other

## 2021-05-05 ENCOUNTER — Encounter (HOSPITAL_BASED_OUTPATIENT_CLINIC_OR_DEPARTMENT_OTHER): Payer: Self-pay | Admitting: *Deleted

## 2021-05-05 DIAGNOSIS — K219 Gastro-esophageal reflux disease without esophagitis: Secondary | ICD-10-CM | POA: Diagnosis present

## 2021-05-05 DIAGNOSIS — U071 COVID-19: Principal | ICD-10-CM | POA: Diagnosis present

## 2021-05-05 DIAGNOSIS — Z7951 Long term (current) use of inhaled steroids: Secondary | ICD-10-CM

## 2021-05-05 DIAGNOSIS — E1122 Type 2 diabetes mellitus with diabetic chronic kidney disease: Secondary | ICD-10-CM | POA: Diagnosis present

## 2021-05-05 DIAGNOSIS — J449 Chronic obstructive pulmonary disease, unspecified: Secondary | ICD-10-CM | POA: Diagnosis present

## 2021-05-05 DIAGNOSIS — I130 Hypertensive heart and chronic kidney disease with heart failure and stage 1 through stage 4 chronic kidney disease, or unspecified chronic kidney disease: Secondary | ICD-10-CM | POA: Diagnosis not present

## 2021-05-05 DIAGNOSIS — Z79899 Other long term (current) drug therapy: Secondary | ICD-10-CM

## 2021-05-05 DIAGNOSIS — N1832 Chronic kidney disease, stage 3b: Secondary | ICD-10-CM | POA: Diagnosis present

## 2021-05-05 DIAGNOSIS — I5032 Chronic diastolic (congestive) heart failure: Secondary | ICD-10-CM | POA: Diagnosis not present

## 2021-05-05 DIAGNOSIS — J9601 Acute respiratory failure with hypoxia: Secondary | ICD-10-CM | POA: Diagnosis present

## 2021-05-05 DIAGNOSIS — E1169 Type 2 diabetes mellitus with other specified complication: Secondary | ICD-10-CM

## 2021-05-05 DIAGNOSIS — D638 Anemia in other chronic diseases classified elsewhere: Secondary | ICD-10-CM | POA: Diagnosis not present

## 2021-05-05 DIAGNOSIS — Z794 Long term (current) use of insulin: Secondary | ICD-10-CM | POA: Diagnosis not present

## 2021-05-05 DIAGNOSIS — Z800 Family history of malignant neoplasm of digestive organs: Secondary | ICD-10-CM | POA: Diagnosis not present

## 2021-05-05 DIAGNOSIS — Z6841 Body Mass Index (BMI) 40.0 and over, adult: Secondary | ICD-10-CM

## 2021-05-05 DIAGNOSIS — Z888 Allergy status to other drugs, medicaments and biological substances status: Secondary | ICD-10-CM

## 2021-05-05 DIAGNOSIS — J9600 Acute respiratory failure, unspecified whether with hypoxia or hypercapnia: Secondary | ICD-10-CM | POA: Diagnosis present

## 2021-05-05 DIAGNOSIS — E119 Type 2 diabetes mellitus without complications: Secondary | ICD-10-CM

## 2021-05-05 DIAGNOSIS — E785 Hyperlipidemia, unspecified: Secondary | ICD-10-CM

## 2021-05-05 LAB — CBC WITH DIFFERENTIAL/PLATELET
Abs Immature Granulocytes: 0.01 10*3/uL (ref 0.00–0.07)
Basophils Absolute: 0 10*3/uL (ref 0.0–0.1)
Basophils Relative: 0 %
Eosinophils Absolute: 0.4 10*3/uL (ref 0.0–0.5)
Eosinophils Relative: 11 %
HCT: 31.9 % — ABNORMAL LOW (ref 36.0–46.0)
Hemoglobin: 9.7 g/dL — ABNORMAL LOW (ref 12.0–15.0)
Immature Granulocytes: 0 %
Lymphocytes Relative: 28 %
Lymphs Abs: 1 10*3/uL (ref 0.7–4.0)
MCH: 26.5 pg (ref 26.0–34.0)
MCHC: 30.4 g/dL (ref 30.0–36.0)
MCV: 87.2 fL (ref 80.0–100.0)
Monocytes Absolute: 0.6 10*3/uL (ref 0.1–1.0)
Monocytes Relative: 15 %
Neutro Abs: 1.7 10*3/uL (ref 1.7–7.7)
Neutrophils Relative %: 46 %
Platelets: 159 10*3/uL (ref 150–400)
RBC: 3.66 MIL/uL — ABNORMAL LOW (ref 3.87–5.11)
RDW: 13.9 % (ref 11.5–15.5)
WBC: 3.7 10*3/uL — ABNORMAL LOW (ref 4.0–10.5)
nRBC: 0 % (ref 0.0–0.2)

## 2021-05-05 LAB — TROPONIN I (HIGH SENSITIVITY): Troponin I (High Sensitivity): 3 ng/L (ref <18)

## 2021-05-05 LAB — COMPREHENSIVE METABOLIC PANEL
ALT: 8 U/L (ref 0–44)
AST: 12 U/L — ABNORMAL LOW (ref 15–41)
Albumin: 4 g/dL (ref 3.5–5.0)
Alkaline Phosphatase: 53 U/L (ref 38–126)
Anion gap: 9 (ref 5–15)
BUN: 49 mg/dL — ABNORMAL HIGH (ref 6–20)
CO2: 30 mmol/L (ref 22–32)
Calcium: 8.7 mg/dL — ABNORMAL LOW (ref 8.9–10.3)
Chloride: 105 mmol/L (ref 98–111)
Creatinine, Ser: 2.16 mg/dL — ABNORMAL HIGH (ref 0.44–1.00)
GFR, Estimated: 26 mL/min — ABNORMAL LOW (ref >60)
Glucose, Bld: 96 mg/dL (ref 70–99)
Potassium: 4.2 mmol/L (ref 3.5–5.1)
Sodium: 144 mmol/L (ref 135–145)
Total Bilirubin: 0.3 mg/dL (ref 0.3–1.2)
Total Protein: 7 g/dL (ref 6.5–8.1)

## 2021-05-05 LAB — D-DIMER, QUANTITATIVE: D-Dimer, Quant: 0.37 ug/mL-FEU (ref 0.00–0.50)

## 2021-05-05 LAB — BRAIN NATRIURETIC PEPTIDE: B Natriuretic Peptide: 43.2 pg/mL (ref 0.0–100.0)

## 2021-05-05 LAB — SARS CORONAVIRUS 2 (TAT 6-24 HRS): SARS Coronavirus 2: POSITIVE — AB

## 2021-05-05 MED ORDER — SODIUM CHLORIDE 0.9 % IV SOLN
100.0000 mg | Freq: Every day | INTRAVENOUS | Status: DC
  Administered 2021-05-06 – 2021-05-07 (×2): 100 mg via INTRAVENOUS
  Filled 2021-05-05 (×2): qty 20

## 2021-05-05 MED ORDER — ORAL CARE MOUTH RINSE
15.0000 mL | Freq: Two times a day (BID) | OROMUCOSAL | Status: DC
  Administered 2021-05-06 – 2021-05-07 (×4): 15 mL via OROMUCOSAL

## 2021-05-05 MED ORDER — REMDESIVIR 100 MG IV SOLR
100.0000 mg | INTRAVENOUS | Status: AC
Start: 2021-05-05 — End: 2021-05-05
  Administered 2021-05-05 (×2): 100 mg via INTRAVENOUS

## 2021-05-05 MED ORDER — IPRATROPIUM-ALBUTEROL 0.5-2.5 (3) MG/3ML IN SOLN
RESPIRATORY_TRACT | Status: AC
  Administered 2021-05-05: 3 mL via RESPIRATORY_TRACT
  Filled 2021-05-05: qty 3

## 2021-05-05 MED ORDER — IPRATROPIUM-ALBUTEROL 0.5-2.5 (3) MG/3ML IN SOLN: 3.0000 mL | Freq: Once | RESPIRATORY_TRACT | Status: AC

## 2021-05-05 MED ORDER — DEXAMETHASONE SODIUM PHOSPHATE 10 MG/ML IJ SOLN
10.0000 mg | Freq: Once | INTRAMUSCULAR | Status: AC
  Administered 2021-05-05: 10 mg via INTRAVENOUS
  Filled 2021-05-05: qty 1

## 2021-05-05 NOTE — ED Notes (Signed)
Attempt to call report.  No answer °

## 2021-05-05 NOTE — Plan of Care (Signed)
[facility] will assume care on arrival to accepting facility. Until arrival, care as per EDP. However, [facility] available 24/7 for questions and assistance.  Nursing staff please page [facility] Admits and Consults ([phone]) as soon as the patient arrives the hospital.   

## 2021-05-05 NOTE — ED Notes (Signed)
RT performed assessment, gave neb treatment, pt COVID + w/history of COPD, bilat BS pre treatment clear/dim/insp whz, strong non productive cough. Post treatment pts bilat BS clr/dim/insp and exp whz. RT will continue to monitor.

## 2021-05-05 NOTE — ED Notes (Addendum)
Report given to [facility] and to [employer] at [facility]

## 2021-05-05 NOTE — Progress Notes (Signed)
[name] [name] notified of covid test results.

## 2021-05-05 NOTE — ED Triage Notes (Signed)
Covid symptoms started on Saturday, tested + yesterday, feels bad with some SHOB. Pt with nasal congestion, productive yellow secretions with cough.

## 2021-05-05 NOTE — Telephone Encounter (Addendum)
Pt called and stated she tested COVID positive on 05/05/21. Pt is experiencing SOB, cough, chest/nasal congestion, body aches. Symptoms started over the weekend and pt went to covid testing site to get tested on 10/17. Due to pts symptoms, advised to go to ED to be treated. Pt didn't want to have to go sit in ER so advised that I will call EMS for her to transport to ED. 911 was called and sending paramedics out to the home. I stayed on the phone with the pt until EMS arrived. Pt will be transported to ED via EMS.   Summary: cold and flu like symptoms   The patient tested positive for COVID 19 today 05/05/21   The patient has shared with their daughter that they're experiencing a cough, general soreness from coughing, an elevated temperature   The patient has also previously had a runny nose as well   The patient would like to discuss their symptoms further as well as a possible prescription for oxygen   The patient can be reached on their mobile number [phone]       Reason for Disposition  Patient sounds very sick or weak to the triager  Answer Assessment - Initial Assessment Questions 1. COVID-19 DIAGNOSIS: "Who made your COVID-19 diagnosis?" "Was it confirmed by a positive lab test or self-test?" If not diagnosed by a doctor (or NP/PA), ask "Are there lots of cases (community spread) where you live?" Note: See public health department website, if unsure.     Covid Testing center 2. COVID-19 EXPOSURE: "Was there any known exposure to COVID before the symptoms began?" CDC Definition of close contact: within 6 feet (2 meters) for a total of 15 minutes or more over a 24-hour period.      yes 3. ONSET: "When did the COVID-19 symptoms start?"      Over the weekend 4. WORST SYMPTOM: "What is your worst symptom?" (e.g., cough, fever, shortness of breath, muscle aches)     SOB, cough, body aches, chest congestion  5. COUGH: "Do you have a cough?" If Yes, ask: "How bad is the cough?"        yes 6. FEVER: "Do you have a fever?" If Yes, ask: "What is your temperature, how was it measured, and when did it start?"     no 7. RESPIRATORY STATUS: "Describe your breathing?" (e.g., shortness of breath, wheezing, unable to speak)      Shortness of breath, wheezing 8. BETTER-SAME-WORSE: "Are you getting better, staying the same or getting worse compared to yesterday?"  If getting worse, ask, "In what way?"     same 9. HIGH RISK DISEASE: "Do you have any chronic medical problems?" (e.g., asthma, heart or lung disease, weak immune system, obesity, etc.)     Diabetes, CHF 10. VACCINE: "Have you had the COVID-19 vaccine?" If Yes, ask: "Which one, how many shots, when did you get it?"       Yes 11. BOOSTER: "Have you received your COVID-19 booster?" If Yes, ask: "Which one and when did you get it?"       1, unsure 12. PREGNANCY: "Is there any chance you are pregnant?" "When was your last menstrual period?"       No 13. OTHER SYMPTOMS: "Do you have any other symptoms?"  (e.g., chills, fatigue, headache, loss of smell or taste, muscle pain, sore throat)       Chills, nasal congestion, weakness 14. O2 SATURATION MONITOR:  "Do you use an oxygen saturation monitor (pulse  oximeter) at home?" If Yes, ask "What is your reading (oxygen level) today?" "What is your usual oxygen saturation reading?" (e.g., 95%)       No  Protocols used: Coronavirus (COVID-19) Diagnosed or Suspected-A-AH

## 2021-05-05 NOTE — ED Provider Notes (Signed)
[facility] EMERGENCY DEPT Provider Note   CSN: [id number] Arrival date & time: 05/05/21  1801     History Chief Complaint  Patient presents with   Shortness of Breath   Covid Positive    [name] [name] is a 60 y.o. female.  HPI     60 year old female with history of CHF, COPD, morbid obesity, GERD, hypertension, diabetes, CKD, chronic anemia, chronic hepatitis C, admission for CHF exacerbation 1 month ago, presents with concern for positive COVID test yesterday with shortness of breath, cough and congestion.  Reports that she drove her sister home from the hospital on Friday, and thinks that she contracted COVID from her.  Reports she developed congestion and cough 2 days ago.  She now has worsening shortness of breath.  Describes it as a dry cough.  Has pretty significant nasal congestion.  Denies sore throat, nausea, vomiting, diarrhea, abdominal pain.  No known fevers.  Her shortness of breath is worse with exertion.  Denies any leg pain or swelling.  Denies chest pain.  Had 2 COVID shots and had COVID in January.  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) 12/04/2020   CKD stage 3 due to type 2 diabetes mellitus ([place]) 12/04/2020   Anemia of chronic disease 12/04/2020   Class 3 obesity ([place]) 07/27/2020   Acute  respiratory disease due to COVID-19 virus 07/25/2020   Acute hypoxemic respiratory failure due to COVID-19 ([place]) 07/25/2020   Morbid obesity ([place]) 11/23/2019   Acute respiratory failure with hypoxia ([place]) 11/06/2019   Chronic kidney disease, stage 3b ([place]) 11/06/2019   COPD with acute bronchitis ([place])    Acute kidney failure ([place]) 08/18/2019   Left ventricular hypertrophy 07/21/2019   Educated about COVID-19 virus infection 65/09/5463   Acute diastolic HF (heart failure) ([place])    Hypoxia    Dyspnea on exertion 05/09/2019   Sleep apnea 05/09/2019   Hyperglycemia 04/09/2019   Snoring 03/13/2019   Symptomatic anemia 08/17/2018   Acute renal failure (ARF) ([place]) 08/17/2018   Chronic cystitis 08/03/2018   Diabetic neuropathy ([place]) 06/16/2017   Non compliance w medication regimen 04/12/2017   Family history of colon cancer in mother 11/17/2016   Dyspareunia in female 11/11/2016   Screen for colon cancer 11/11/2016   History of ovarian cyst 11/11/2016   Chronic hepatitis C without hepatic coma ([place]) 11/09/2016   Hyperlipidemia 10/07/2016   Insulin dependent type 2 diabetes mellitus ([place]) 01/29/2014   Status post intraocular lens implant 11/08/2013   PCO (posterior capsular opacification) 09/28/2013   Pseudophakia 09/12/2013   GERD (gastroesophageal reflux disease) 09/06/2013   Hypertension 09/06/2013   BREAST PAIN, BILATERAL 02/19/2010   ANEMIA, IRON DEFICIENCY 10/03/2009   LEUKOPENIA, MILD 09/19/2009   LIPOMA 06/18/2009   EUSTACHIAN TUBE DYSFUNCTION  06/18/2009   TINEA PEDIS 05/07/2009   SKIN LESION 05/07/2009   COUGH 05/07/2009   SUBSTANCE ABUSE, MULTIPLE 02/22/2007   HCVD (hypertensive cardiovascular disease) 02/22/2007    Past Surgical History:  Procedure Laterality Date   BREAST BIOPSY Left 2018   CESAREAN SECTION     breech     OB History     Gravida  1   Para  1   Term  1   Preterm      AB      Living  1      SAB      IAB      Ectopic       Multiple      Live Births  1           Family History  Problem Relation Age of Onset   Colon cancer Mother    Liver disease Sister    Other Neg Hx    Breast cancer Neg Hx    Esophageal cancer Neg Hx    Rectal cancer Neg Hx     Social History   Tobacco Use   Smoking status: Never   Smokeless tobacco: Never  Vaping Use   Vaping Use: Never used  Substance Use Topics   Alcohol use: No   Drug use: Not Currently    Types: Cocaine, Marijuana    Comment: remote h/o cocaine 2015 and marijuana 2018    Home Medications Prior to Admission medications   Medication Sig Start Date End Date Taking? Authorizing Provider  albuterol (PROVENTIL) (2.5 MG/3ML) 0.083% nebulizer solution Take 3 mLs (2.5 mg total) by nebulization every 4 (four) hours as needed for wheezing or shortness of breath. 02/12/21  Yes Henderly, Britni A, PA-C  albuterol (VENTOLIN HFA) 108 (90 Base) MCG/ACT inhaler Inhale 1 puff into the lungs every 4 (four) hours as needed for wheezing or shortness of breath. 12/10/20  Yes Newlin, Enobong, MD  amLODipine (NORVASC) 10 MG tablet Take 1 tablet (10 mg total) by mouth daily. 09/03/20  Yes Darylene Price A, FNP  atorvastatin (LIPITOR) 40 MG tablet Take 1 tablet (40 mg total) by mouth every morning. Must have HF appt for further refills 12/09/20  Yes Darylene Price A, FNP  carvedilol (COREG) 6.25 MG tablet Take 1 tablet (6.25 mg total) by mouth 2 (two) times daily with a meal. 04/04/21 05/05/22 Yes Kc, Maren Beach, MD  fluticasone (FLOVENT HFA) 110 MCG/ACT inhaler Inhale 2 puffs into the lungs 2 (two) times daily. 02/23/21  Yes [name], MD  hydrALAZINE (APRESOLINE) 50 MG tablet Take 1 tablet (50 mg total) by mouth 3 (three) times daily. 04/01/21  Yes Newlin, Charlane Ferretti, MD  insulin glargine (LANTUS SOLOSTAR) 100 UNIT/ML Solostar Pen Inject 58 Units into the skin daily. 04/21/21  Yes [name], MD  potassium chloride SA (KLOR-CON) 20 MEQ tablet Take 1 tablet (20 mEq total) by mouth  daily. 04/05/21 05/05/21 Yes Kc, Maren Beach, MD  pregabalin (LYRICA) 75 MG capsule TAKE ONE CAPSULE BY MOUTH EVERY MORNING and TAKE ONE CAPSULE BY MOUTH EVERY EVENING Patient taking differently: Take 75 mg by mouth 2 (two) times daily. 12/10/20  Yes [name], MD  spironolactone (ALDACTONE) 25 MG tablet Take 1 tablet (25 mg total) by mouth daily. 04/21/21  Yes Newlin, Charlane Ferretti, MD  Torsemide 40 MG TABS Take 40 mg by mouth daily for 5 days, THEN 40 mg every other day. Patient taking differently: Take 43m by mouth every other day 04/13/21 05/18/21 Yes WKatherine Roan  MD  VICTOZA 18 MG/3ML SOPN INJECT 1.8 MG into THE SKIN DAILY AS DIRECTED Patient taking differently: Inject 1.8 mg into the skin daily. 04/14/21  Yes [name], MD  Artificial Tear Ointment (DRY EYES OP) Place 1 drop into both eyes daily as needed (dry eyes).    [provider]  blood glucose meter kit and supplies KIT Dispense based on patient and insurance preference. Use up to four times daily as directed. (FOR ICD-9 250.00, 250.01). 04/12/19   Dustin Flock, MD  doxycycline (VIBRAMYCIN) 100 MG capsule Take 1 capsule (100 mg total) by mouth 2 (two) times daily. Patient not taking: Reported on 05/05/2021 03/26/21   Hendricks Limes, PA-C  ferrous sulfate 325 (65 FE) MG tablet Take 325 mg by mouth daily with breakfast. Patient not taking: Reported on 05/05/2021    [provider]  [place] Devices MISC 2 L of oxygen via nasal cannula.  Diagnosis-chronic respiratory failure with hypoxia 04/21/21   [name], MD    Allergies    Lisinopril  Review of Systems   Review of Systems  Constitutional:  Positive for fatigue. Negative for fever.  HENT:  Positive for congestion and rhinorrhea. Negative for sore throat.   Eyes:  Negative for visual disturbance.  Respiratory:  Positive for cough and shortness of breath.   Cardiovascular:  Negative for chest pain.  Gastrointestinal:  Negative for abdominal pain,  nausea and vomiting.  Genitourinary:  Negative for difficulty urinating.  Musculoskeletal:  Negative for back pain and neck pain.  Skin:  Negative for rash.  Neurological:  Negative for syncope and headaches.   Physical Exam Updated Vital Signs BP (!) 160/86 (BP Location: Right Arm)   Pulse 88   Temp 98.2 F (36.8 C) (Oral)   Resp (!) 21   Ht 5' 4" (1.626 m)   Wt 108.3 kg   LMP 04/01/2010   SpO2 94%   BMI 40.98 kg/m   Physical Exam Vitals and nursing note reviewed.  Constitutional:      General: She is not in acute distress.    Appearance: She is well-developed. She is ill-appearing. She is not diaphoretic.  HENT:     Head: Normocephalic and atraumatic.  Eyes:     Conjunctiva/sclera: Conjunctivae normal.  Cardiovascular:     Rate and Rhythm: Normal rate and regular rhythm.     Heart sounds: Normal heart sounds. No murmur heard.   No friction rub. No gallop.  Pulmonary:     Effort: Pulmonary effort is normal. No respiratory distress.     Breath sounds: Rhonchi present. No wheezing or rales.  Abdominal:     General: There is no distension.     Palpations: Abdomen is soft.     Tenderness: There is no abdominal tenderness. There is no guarding.  Musculoskeletal:        General: No tenderness.     Cervical back: Normal range of motion.  Skin:    General: Skin is warm and dry.     Findings: No erythema or rash.  Neurological:     Mental Status: She is alert and oriented to person, place, and time.    ED Results / Procedures / Treatments   Labs (all labs ordered are listed, but only abnormal results are displayed) Labs Reviewed  CBC WITH DIFFERENTIAL/PLATELET - Abnormal; Notable for the following components:      Result Value   WBC 3.7 (*)    RBC 3.66 (*)    Hemoglobin 9.7 (*)  HCT 31.9 (*)    All other components within normal limits  COMPREHENSIVE METABOLIC PANEL - Abnormal; Notable for the following components:   BUN 49 (*)    Creatinine, Ser 2.16 (*)     Calcium 8.7 (*)    AST 12 (*)    GFR, Estimated 26 (*)    All other components within normal limits  BRAIN NATRIURETIC PEPTIDE  D-DIMER, QUANTITATIVE  TROPONIN I (HIGH SENSITIVITY)    EKG None  Radiology DG Chest Portable 1 View  Result Date: 05/05/2021 CLINICAL DATA:  Shortness of breath EXAM: PORTABLE CHEST 1 VIEW COMPARISON:  04/01/2021 FINDINGS: Mild cardiomegaly. No focal opacity. No pleural effusion or pneumothorax. IMPRESSION: No active disease.  Mild cardiomegaly Electronically Signed   By: Donavan Foil M.D.   On: 05/05/2021 19:09    Procedures Procedures   Medications Ordered in ED Medications  remdesivir 100 mg in sodium chloride 0.9 % 100 mL IVPB (0 mg Intravenous Stopped 05/05/21 2300)    Followed by  remdesivir 100 mg in sodium chloride 0.9 % 100 mL IVPB (has no administration in time range)  MEDLINE mouth rinse (has no administration in time range)  ipratropium-albuterol (DUONEB) 0.5-2.5 (3) MG/3ML nebulizer solution 3 mL (3 mLs Nebulization Given 05/05/21 2029)  dexamethasone (DECADRON) injection 10 mg (10 mg Intravenous Given 05/05/21 2037)    ED Course  I have reviewed the triage vital signs and the nursing notes.  Pertinent labs & imaging results that were available during my care of the patient were reviewed by me and considered in my medical decision making (see chart for details).    MDM Rules/Calculators/A&P                            60 year old female with history of CHF, COPD, morbid obesity, GERD, hypertension, diabetes, CKD, chronic anemia, chronic hepatitis C, admission for CHF exacerbation 1 month ago, presents with concern for positive COVID test yesterday with shortness of breath, cough and congestion.  EKG was evaluated by me and showed no acute changes in comparison to prior.  Chest x-ray shows no evidence of pulmonary edema or pneumonia.  Troponin is negative, and BNP is within normal limits, and have low clinical suspicion for ACS or CHF  exacerbation.  Discussed with hospitalist and order D-dimer which is also negative and have low suspicion for PE.  Oxygen saturations down to 88% on room air.  She was given breathing treatments and Decadron in the emergency department, placed order for remdesivir.  Admitted for further care   Final Clinical Impression(s) / ED Diagnoses Final diagnoses:  COVID-19  Acute respiratory failure with hypoxia [facility])    Rx / DC Orders ED Discharge Orders     None        Gareth Morgan, MD 05/06/21 0008

## 2021-05-06 ENCOUNTER — Encounter (HOSPITAL_COMMUNITY): Payer: Self-pay

## 2021-05-06 ENCOUNTER — Inpatient Hospital Stay (HOSPITAL_COMMUNITY)
Admission: RE | Admit: 2021-05-06 | Discharge: 2021-05-06 | Disposition: A | Discharge status: Home / Self Care | Payer: Medicare Other | Source: Ambulatory Visit | Attending: Family Medicine | Admitting: Family Medicine

## 2021-05-06 ENCOUNTER — Encounter (HOSPITAL_COMMUNITY): Payer: Self-pay | Admitting: Internal Medicine

## 2021-05-06 DIAGNOSIS — U071 COVID-19: Secondary | ICD-10-CM | POA: Diagnosis not present

## 2021-05-06 DIAGNOSIS — I5032 Chronic diastolic (congestive) heart failure: Secondary | ICD-10-CM | POA: Diagnosis present

## 2021-05-06 DIAGNOSIS — J9601 Acute respiratory failure with hypoxia: Secondary | ICD-10-CM

## 2021-05-06 DIAGNOSIS — J9600 Acute respiratory failure, unspecified whether with hypoxia or hypercapnia: Secondary | ICD-10-CM | POA: Diagnosis present

## 2021-05-06 LAB — CBC
HCT: 34.1 % — ABNORMAL LOW (ref 36.0–46.0)
Hemoglobin: 10.2 g/dL — ABNORMAL LOW (ref 12.0–15.0)
MCH: 26.6 pg (ref 26.0–34.0)
MCHC: 29.9 g/dL — ABNORMAL LOW (ref 30.0–36.0)
MCV: 88.8 fL (ref 80.0–100.0)
Platelets: 157 10*3/uL (ref 150–400)
RBC: 3.84 MIL/uL — ABNORMAL LOW (ref 3.87–5.11)
RDW: 13.8 % (ref 11.5–15.5)
WBC: 4.2 10*3/uL (ref 4.0–10.5)
nRBC: 0 % (ref 0.0–0.2)

## 2021-05-06 LAB — CREATININE, SERUM
Creatinine, Ser: 1.71 mg/dL — ABNORMAL HIGH (ref 0.44–1.00)
GFR, Estimated: 34 mL/min — ABNORMAL LOW (ref >60)

## 2021-05-06 LAB — GLUCOSE, CAPILLARY
Glucose-Capillary: 225 mg/dL — ABNORMAL HIGH (ref 70–99)
Glucose-Capillary: 223 mg/dL — ABNORMAL HIGH (ref 70–99)
Glucose-Capillary: 258 mg/dL — ABNORMAL HIGH (ref 70–99)
Glucose-Capillary: 216 mg/dL — ABNORMAL HIGH (ref 70–99)

## 2021-05-06 MED ORDER — SPIRONOLACTONE 25 MG PO TABS
25.0000 mg | ORAL_TABLET | Freq: Every day | ORAL | Status: DC
  Administered 2021-05-06 – 2021-05-07 (×2): 25 mg via ORAL
  Filled 2021-05-06 (×2): qty 1

## 2021-05-06 MED ORDER — ALBUTEROL SULFATE HFA 108 (90 BASE) MCG/ACT IN AERS: 1.0000 | INHALATION_SPRAY | RESPIRATORY_TRACT | Status: DC | PRN

## 2021-05-06 MED ORDER — PREGABALIN 75 MG PO CAPS
75.0000 mg | ORAL_CAPSULE | Freq: Two times a day (BID) | ORAL | Status: DC
  Administered 2021-05-06 – 2021-05-07 (×3): 75 mg via ORAL
  Filled 2021-05-06 (×3): qty 1

## 2021-05-06 MED ORDER — ATORVASTATIN CALCIUM 40 MG PO TABS
40.0000 mg | ORAL_TABLET | Freq: Every morning | ORAL | Status: DC
  Administered 2021-05-06 – 2021-05-07 (×2): 40 mg via ORAL
  Filled 2021-05-06 (×2): qty 1

## 2021-05-06 MED ORDER — TORSEMIDE 20 MG PO TABS
20.0000 mg | ORAL_TABLET | Freq: Every day | ORAL | Status: DC
  Administered 2021-05-06 – 2021-05-07 (×2): 20 mg via ORAL
  Filled 2021-05-06 (×2): qty 1

## 2021-05-06 MED ORDER — FLUTICASONE PROPIONATE 50 MCG/ACT NA SUSP
1.0000 | Freq: Every day | NASAL | Status: DC
  Administered 2021-05-06 – 2021-05-07 (×2): 1 via NASAL
  Filled 2021-05-06: qty 16

## 2021-05-06 MED ORDER — GUAIFENESIN-DM 100-10 MG/5ML PO SYRP
5.0000 mL | ORAL_SOLUTION | ORAL | Status: DC | PRN
  Administered 2021-05-06 – 2021-05-07 (×2): 5 mL via ORAL
  Filled 2021-05-06 (×2): qty 10

## 2021-05-06 MED ORDER — AMLODIPINE BESYLATE 10 MG PO TABS
10.0000 mg | ORAL_TABLET | Freq: Every day | ORAL | Status: DC
  Administered 2021-05-06 – 2021-05-07 (×2): 10 mg via ORAL
  Filled 2021-05-06 (×2): qty 1

## 2021-05-06 MED ORDER — INSULIN GLARGINE-YFGN 100 UNIT/ML ~~LOC~~ SOLN
58.0000 [IU] | Freq: Every day | SUBCUTANEOUS | Status: DC
  Administered 2021-05-06 – 2021-05-07 (×2): 58 [IU] via SUBCUTANEOUS
  Filled 2021-05-06 (×2): qty 0.58

## 2021-05-06 MED ORDER — DEXAMETHASONE SODIUM PHOSPHATE 10 MG/ML IJ SOLN
6.0000 mg | INTRAMUSCULAR | Status: DC
Start: 2021-05-06 — End: 2021-05-07
  Administered 2021-05-06 – 2021-05-07 (×2): 6 mg via INTRAVENOUS
  Filled 2021-05-06 (×2): qty 1

## 2021-05-06 MED ORDER — INSULIN ASPART 100 UNIT/ML IJ SOLN
0.0000 [IU] | Freq: Three times a day (TID) | INTRAMUSCULAR | Status: DC
  Administered 2021-05-06: 3 [IU] via SUBCUTANEOUS
  Administered 2021-05-06: 5 [IU] via SUBCUTANEOUS
  Administered 2021-05-06: 3 [IU] via SUBCUTANEOUS
  Administered 2021-05-07 (×2): 2 [IU] via SUBCUTANEOUS

## 2021-05-06 MED ORDER — ENOXAPARIN SODIUM 40 MG/0.4ML IJ SOSY
40.0000 mg | PREFILLED_SYRINGE | INTRAMUSCULAR | Status: DC
  Administered 2021-05-06 – 2021-05-07 (×2): 40 mg via SUBCUTANEOUS
  Filled 2021-05-06 (×2): qty 0.4

## 2021-05-06 MED ORDER — POTASSIUM CHLORIDE CRYS ER 20 MEQ PO TBCR
20.0000 meq | EXTENDED_RELEASE_TABLET | Freq: Every day | ORAL | Status: DC
  Administered 2021-05-06 – 2021-05-07 (×2): 20 meq via ORAL
  Filled 2021-05-06 (×2): qty 1

## 2021-05-06 MED ORDER — CARVEDILOL 6.25 MG PO TABS
6.2500 mg | ORAL_TABLET | Freq: Two times a day (BID) | ORAL | Status: DC
  Administered 2021-05-06 – 2021-05-07 (×4): 6.25 mg via ORAL
  Filled 2021-05-06 (×4): qty 1

## 2021-05-06 MED ORDER — FERROUS SULFATE 325 (65 FE) MG PO TABS
325.0000 mg | ORAL_TABLET | Freq: Every day | ORAL | Status: DC
  Administered 2021-05-06 – 2021-05-07 (×2): 325 mg via ORAL
  Filled 2021-05-06 (×2): qty 1

## 2021-05-06 MED ORDER — HYDRALAZINE HCL 50 MG PO TABS
50.0000 mg | ORAL_TABLET | Freq: Three times a day (TID) | ORAL | Status: DC
  Administered 2021-05-06 – 2021-05-07 (×5): 50 mg via ORAL
  Filled 2021-05-06 (×5): qty 1

## 2021-05-06 NOTE — H&P (Signed)
History and Physical    Meghana Tullo JKK:[id number] DOB: September 27, 1960 DOA: 05/05/2021  PCP: [name], MD  Patient coming from: Home.  Chief Complaint: Shortness of breath and nasal congestion.  HPI: [name] [name] is a 60 y.o. female with history of CHF, hypertension, diabetes mellitus, chronic kidney disease stage III was admitted last month for CHF presents to the ER because of increasing shortness of breath nasal congestion and cough for the last 3 days.  Denies chest pain.  Has had some diarrhea.  ED Course: In the ER patient is tested positive for COVID.  Chest x-ray does not show any infiltrates.  BNP was normal.  EKG shows normal sinus rhythm.  Patient admitted for further management of COVID infection given the multiple comorbidities.  Labs show creatinine of 2.1 hemoglobin 9.7 BNP of 43.2 high sensitive troponin of 3.  Patient is requiring 2 L oxygen to maintain sats.  Review of Systems: As per HPI, rest all negative.   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 She has never used smokeless tobacco. She reports that she does not currently use drugs after having used the following drugs: Cocaine and Marijuana. She reports that she does not drink alcohol.  Allergies  Allergen Reactions   Lisinopril Swelling    Family History  Problem Relation Age of Onset   Colon cancer Mother    Liver disease Sister    Other Neg Hx     Breast cancer Neg Hx    Esophageal cancer Neg Hx    Rectal cancer Neg Hx     Prior to Admission medications   Medication Sig Start Date End Date Taking? Authorizing Provider  albuterol (PROVENTIL) (2.5 MG/3ML) 0.083% nebulizer solution Take 3 mLs (2.5 mg total) by nebulization every 4 (four) hours as needed for wheezing or shortness of breath. 02/12/21  Yes Henderly, Britni A, PA-C  albuterol (VENTOLIN HFA) 108 (90 Base) MCG/ACT inhaler Inhale 1 puff into the lungs every 4 (four) hours as needed for wheezing or shortness of breath. 12/10/20  Yes Newlin, Enobong, MD  amLODipine (NORVASC) 10 MG tablet Take 1 tablet (10 mg total) by mouth daily. 09/03/20  Yes Darylene Price A, FNP  atorvastatin (LIPITOR) 40 MG tablet Take 1 tablet (40 mg total) by mouth every morning. Must have HF appt for further refills 12/09/20  Yes Darylene Price A, FNP  carvedilol (COREG) 6.25 MG tablet Take 1 tablet (6.25 mg total) by mouth 2 (two) times daily with a meal. 04/04/21 05/05/22 Yes Kc, Maren Beach, MD  fluticasone (FLOVENT HFA) 110 MCG/ACT inhaler Inhale 2 puffs into the lungs 2 (two) times daily. 02/23/21  Yes [name], MD  hydrALAZINE (APRESOLINE) 50 MG tablet Take 1 tablet (50 mg total) by mouth 3 (three) times daily. 04/01/21  Yes [name], MD  insulin glargine (LANTUS SOLOSTAR)  100 UNIT/ML Solostar Pen Inject 58 Units into the skin daily. 04/21/21  Yes [name], MD  potassium chloride SA (KLOR-CON) 20 MEQ tablet Take 1 tablet (20 mEq total) by mouth daily. 04/05/21 05/05/21 Yes Kc, Maren Beach, MD  pregabalin (LYRICA) 75 MG capsule TAKE ONE CAPSULE BY MOUTH EVERY MORNING and TAKE ONE CAPSULE BY MOUTH EVERY EVENING Patient taking differently: Take 75 mg by mouth 2 (two) times daily. 12/10/20  Yes [name], MD  spironolactone (ALDACTONE) 25 MG tablet Take 1 tablet (25 mg total) by mouth daily. 04/21/21  Yes Newlin, Charlane Ferretti, MD  Torsemide 40 MG TABS Take 40 mg by mouth daily for 5 days, THEN 40 mg every other  day. Patient taking differently: Take 75m by mouth every other day 04/13/21 05/18/21 Yes Winfrey, WJenne Pane MD  VICTOZA 18 MG/3ML SOPN INJECT 1.8 MG into THE SKIN DAILY AS DIRECTED Patient taking differently: Inject 1.8 mg into the skin daily. 04/14/21  Yes NCharlott Rakes MD  Artificial Tear Ointment (DRY EYES OP) Place 1 drop into both eyes daily as needed (dry eyes).    [provider]  blood glucose meter kit and supplies KIT Dispense based on patient and insurance preference. Use up to four times daily as directed. (FOR ICD-9 250.00, 250.01). 04/12/19   PDustin Flock MD  doxycycline (VIBRAMYCIN) 100 MG capsule Take 1 capsule (100 mg total) by mouth 2 (two) times daily. Patient not taking: Reported on 05/05/2021 03/26/21   FHendricks Limes PA-C  ferrous sulfate 325 (65 FE) MG tablet Take 325 mg by mouth daily with breakfast. Patient not taking: Reported on 05/05/2021    [provider]  MBartholomew [employer] MISC 2 L of oxygen via nasal cannula.  Diagnosis-chronic respiratory failure with hypoxia 04/21/21   NCharlott Rakes MD    Physical Exam: Constitutional: Moderately built and nourished. Vitals:   05/05/21 2008 05/05/21 2033 05/05/21 2225 05/06/21 0228  BP: 132/71  (!) 160/86 135/73  Pulse: 85  88   Resp: 20  (!) 21 20  Temp:   98.2 F (36.8 C) 97.9 F (36.6 C)  TempSrc:   Oral Oral  SpO2: 91% 95% 94% 91%  Weight:   108.3 kg   Height:   '5\' 4"'  (1.626 m)    Eyes: Anicteric no pallor. ENMT: No discharge from the ears eyes nose and mouth. Neck: No mass felt.  No neck rigidity. Respiratory: No rhonchi or crepitations. Cardiovascular: S1-S2 heard. Abdomen: Soft nontender bowel sound present. Musculoskeletal: No edema. Skin: No rash. Neurologic: Alert awake oriented to time place and person.  Moves all extremities. Psychiatric: Appears normal.  Normal affect.   Labs on Admission: I have personally reviewed following labs and imaging studies  CBC: Recent Labs   Lab 05/05/21 1920  WBC 3.7*  NEUTROABS 1.7  HGB 9.7*  HCT 31.9*  MCV 87.2  PLT 1811  Basic Metabolic Panel: Recent Labs  Lab 05/05/21 1920  NA 144  K 4.2  CL 105  CO2 30  GLUCOSE 96  BUN 49*  CREATININE 2.16*  CALCIUM 8.7*   GFR: Estimated Creatinine Clearance: 33.3 mL/min (A) (by C-G formula based on SCr of 2.16 mg/dL (H)). Liver Function Tests: Recent Labs  Lab 05/05/21 1920  AST 12*  ALT 8  ALKPHOS 53  BILITOT 0.3  PROT 7.0  ALBUMIN 4.0   No results for input(s): LIPASE, AMYLASE in the last 168 hours. No results for input(s): AMMONIA in the last 168 hours. Coagulation Profile: No results  for input(s): INR, PROTIME in the last 168 hours. Cardiac Enzymes: No results for input(s): CKTOTAL, CKMB, CKMBINDEX, TROPONINI in the last 168 hours. BNP (last 3 results) No results for input(s): PROBNP in the last 8760 hours. HbA1C: No results for input(s): HGBA1C in the last 72 hours. CBG: No results for input(s): GLUCAP in the last 168 hours. Lipid Profile: No results for input(s): CHOL, HDL, LDLCALC, TRIG, CHOLHDL, LDLDIRECT in the last 72 hours. Thyroid Function Tests: No results for input(s): TSH, T4TOTAL, FREET4, T3FREE, THYROIDAB in the last 72 hours. Anemia Panel: No results for input(s): VITAMINB12, FOLATE, FERRITIN, TIBC, IRON, RETICCTPCT in the last 72 hours. Urine analysis:    Component Value Date/Time   COLORURINE STRAW (A) 04/09/2019 1615   APPEARANCEUR CLOUDY (A) 04/09/2019 1615   LABSPEC 1.010 04/09/2019 1615   PHURINE 5.0 04/09/2019 1615   GLUCOSEU >=500 (A) 04/09/2019 1615   HGBUR SMALL (A) 04/09/2019 1615   HGBUR negative 06/29/2010 1356   BILIRUBINUR NEGATIVE 04/09/2019 1615   KETONESUR NEGATIVE 04/09/2019 1615   PROTEINUR 100 (A) 04/09/2019 1615   UROBILINOGEN 1.0 01/26/2015 0925   NITRITE NEGATIVE 04/09/2019 1615   LEUKOCYTESUR SMALL (A) 04/09/2019 1615   Sepsis Labs: '@LABRCNTIP' (procalcitonin:4,lacticidven:4) ) Recent Results (from  the past 240 hour(s))  SARS Coronavirus 2 (TAT 6-24 hrs)     Status: Abnormal   Collection Time: 05/04/21 12:00 AM  Result Value Ref Range Status   SARS Coronavirus 2 RESULT: POSITIVE (A)  Final    Comment: RESULT: POSITIVESARS-CoV-2 INTERPRETATION:A POSITIVE  test result means that SARS-CoV-2 RNA was present in the specimen above the limit of detection of this test. The presence of SARS-CoV-2 RNA is indicative of infection with SARS-CoV-2. Detection of  SARS-CoV-2 RNA may not rule-out bacterial infection or co-infection with other viruses. Positive and negative predictive values of testing are highly dependent on prevalence. False positive test results are more likely when prevalence of disease is  low.The expected result is NEGATIVE.Fact Sheet for Healthcare Providers: SisterViews.hu Sheet for Patients: SwimConditioning.hu Reference Range - Negative      Radiological Exams on Admission: DG Chest Portable 1 View  Result Date: 05/05/2021 CLINICAL DATA:  Shortness of breath EXAM: PORTABLE CHEST 1 VIEW COMPARISON:  04/01/2021 FINDINGS: Mild cardiomegaly. No focal opacity. No pleural effusion or pneumothorax. IMPRESSION: No active disease.  Mild cardiomegaly Electronically Signed   By: Donavan Foil M.D.   On: 05/05/2021 19:09    EKG: Independently reviewed.  Normal sinus rhythm.  Assessment/Plan Principal Problem:   Acute hypoxemic respiratory failure due to COVID-19 [facility]) Active Problems:   Insulin dependent type 2 diabetes mellitus (HCC)   Chronic kidney disease, stage 3b (HCC)   Anemia of chronic disease   Chronic diastolic CHF (congestive heart failure) (HCC)   Acute respiratory failure due to COVID-19 [facility])    Acute respiratory failure hypoxia secondary COVID infection for which patient is on remdesivir I will also start patient on Decadron.  Follow inflammatory markers respiratory status. Chronic diastolic CHF last EF  measured was 65 to 70% last month we will continue on torsemide and spironolactone. Diabetes mellitus type 2 on Lantus insulin since patient is on Decadron closely follow CBGs.  On sliding scale coverage. Chronic kidney disease stage III creatinine appears to be at baseline. Chronic anemia follow CBC. Hypertension on amlodipine and hydralazine and Coreg.  Since patient has COVID infection with hypoxia will need close monitoring for any further worsening inpatient status.   DVT prophylaxis: Lovenox. Code Status: Full code. Family  Communication: Discussed with patient. Disposition Plan: Home. Consults called: None. Admission status: Inpatient.   Rise Patience MD [facility] Pager [phone].  If 7PM-7AM, please contact night-coverage [email or web address] Password [email or web address]  05/06/2021, 3:41 AM

## 2021-05-06 NOTE — Progress Notes (Signed)
PROGRESS NOTE    [name] [name]  BHA:[id number] DOB: 1961-01-18 DOA: 05/05/2021 PCP: [name], MD    Brief Narrative:  60 y.o. female with history of CHF, hypertension, diabetes mellitus, chronic kidney disease stage III was admitted last month for CHF presents to the ER because of increasing shortness of breath nasal congestion and cough for the last 3 days.  Denies chest pain.  Has had some diarrhea.   ED Course: In the ER patient is tested positive for COVID.  Chest x-ray does not show any infiltrates.  BNP was normal.  EKG shows normal sinus rhythm.  Patient admitted for further management of COVID infection given the multiple comorbidities.  Labs show creatinine of 2.1 hemoglobin 9.7 BNP of 43.2 high sensitive troponin of 3.  Patient is requiring 2 L oxygen to maintain sats  Of note, patient is not vaccinated with initial 2 vaccines over 1 year ago, has not received booster shots.  Assessment & Plan:   Principal Problem:   Acute hypoxemic respiratory failure due to COVID-19 [facility]) Active Problems:   Insulin dependent type 2 diabetes mellitus (HCC)   Chronic kidney disease, stage 3b (HCC)   Anemia of chronic disease   Chronic diastolic CHF (congestive heart failure) (HCC)   Acute respiratory failure due to COVID-19 [facility])   Acute respiratory failure hypoxia secondary COVID infection f Cont on remdesivir and on Decadron.   Cont to follow inflammatory markers respiratory status. Chronic diastolic CHF last EF measured was 65 to 70% last month  Will continue on torsemide and spironolactone. Diabetes mellitus type 2  Pt is continued on Lantus insulin since patient is on Decadron closely follow CBGs.   Continue with sliding scale coverage. Chronic kidney disease stage III  creatinine appears to be at baseline at time of admit Will repeat bmet in AM Chronic anemia  follow CBC trends Hypertension Pt is on amlodipine and hydralazine and Coreg.  BP stable at this time  DVT  prophylaxis: Lovenox subq Code Status: Full Family Communication: Pt in room, family not at bedside  Status is: Inpatient  Remains inpatient appropriate because: acuity of illness    Consultants:    Procedures:    Antimicrobials: Anti-infectives (From admission, onward)    Start     Dose/Rate Route Frequency Ordered Stop   05/06/21 1000  remdesivir 100 mg in sodium chloride 0.9 % 100 mL IVPB       See Hyperspace for full Linked Orders Report.   100 mg 200 mL/hr over 30 Minutes Intravenous Daily 05/05/21 2041 05/10/21 0959   05/05/21 2045  remdesivir 100 mg in sodium chloride 0.9 % 100 mL IVPB       See Hyperspace for full Linked Orders Report.   100 mg 200 mL/hr over 30 Minutes Intravenous Every 30 min 05/05/21 2041 05/05/21 2300       Subjective: Complaining of stuffy nose  Objective: Vitals:   05/05/21 2225 05/06/21 0228 05/06/21 0633 05/06/21 0954  BP: (!) 160/86 135/73 (!) 147/84 (!) 161/86  Pulse: 88  95 90  Resp: (!) 21 20 17 20   Temp: 98.2 F (36.8 C) 97.9 F (36.6 C) 98.1 F (36.7 C) 98.7 F (37.1 C)  TempSrc: Oral Oral Oral Oral  SpO2: 94% 91% 97% 96%  Weight: 108.3 kg     Height: 5\' 4"  (1.626 m)       Intake/Output Summary (Last 24 hours) at 05/06/2021 1539 Last data filed at 05/06/2021 0955 Gross per 24 hour  Intake 920  ml  Output --  Net 920 ml   Filed Weights   05/05/21 1813 05/05/21 2225  Weight: 113.4 kg 108.3 kg    Examination: General exam: Awake, laying in bed, in nad Respiratory system: Normal respiratory effort, no wheezing Cardiovascular system: regular rate, s1, s2 Gastrointestinal system: Soft, nondistended, positive BS Central nervous system: CN2-12 grossly intact, strength intact Extremities: Perfused, no clubbing Skin: Normal skin turgor, no notable skin lesions seen Psychiatry: Mood normal // no visual hallucinations   Data Reviewed: I have personally reviewed following labs and imaging studies  CBC: Recent Labs   Lab 05/05/21 1920 05/06/21 0350  WBC 3.7* 4.2  NEUTROABS 1.7  --   HGB 9.7* 10.2*  HCT 31.9* 34.1*  MCV 87.2 88.8  PLT 159 665   Basic Metabolic Panel: Recent Labs  Lab 05/05/21 1920 05/06/21 0350  NA 144  --   K 4.2  --   CL 105  --   CO2 30  --   GLUCOSE 96  --   BUN 49*  --   CREATININE 2.16* 1.71*  CALCIUM 8.7*  --    GFR: Estimated Creatinine Clearance: 42 mL/min (A) (by C-G formula based on SCr of 1.71 mg/dL (H)). Liver Function Tests: Recent Labs  Lab 05/05/21 1920  AST 12*  ALT 8  ALKPHOS 53  BILITOT 0.3  PROT 7.0  ALBUMIN 4.0   No results for input(s): LIPASE, AMYLASE in the last 168 hours. No results for input(s): AMMONIA in the last 168 hours. Coagulation Profile: No results for input(s): INR, PROTIME in the last 168 hours. Cardiac Enzymes: No results for input(s): CKTOTAL, CKMB, CKMBINDEX, TROPONINI in the last 168 hours. BNP (last 3 results) No results for input(s): PROBNP in the last 8760 hours. HbA1C: No results for input(s): HGBA1C in the last 72 hours. CBG: Recent Labs  Lab 05/06/21 0732 05/06/21 1105  GLUCAP 225* 223*   Lipid Profile: No results for input(s): CHOL, HDL, LDLCALC, TRIG, CHOLHDL, LDLDIRECT in the last 72 hours. Thyroid Function Tests: No results for input(s): TSH, T4TOTAL, FREET4, T3FREE, THYROIDAB in the last 72 hours. Anemia Panel: No results for input(s): VITAMINB12, FOLATE, FERRITIN, TIBC, IRON, RETICCTPCT in the last 72 hours. Sepsis Labs: No results for input(s): PROCALCITON, LATICACIDVEN in the last 168 hours.  Recent Results (from the past 240 hour(s))  SARS Coronavirus 2 (TAT 6-24 hrs)     Status: Abnormal   Collection Time: 05/04/21 12:00 AM  Result Value Ref Range Status   SARS Coronavirus 2 RESULT: POSITIVE (A)  Final    Comment: RESULT: POSITIVESARS-CoV-2 INTERPRETATION:A POSITIVE  test result means that SARS-CoV-2 RNA was present in the specimen above the limit of detection of this test. The presence  of SARS-CoV-2 RNA is indicative of infection with SARS-CoV-2. Detection of  SARS-CoV-2 RNA may not rule-out bacterial infection or co-infection with other viruses. Positive and negative predictive values of testing are highly dependent on prevalence. False positive test results are more likely when prevalence of disease is  low.The expected result is NEGATIVE.Fact Sheet for Healthcare Providers: SisterViews.hu Sheet for Patients: SwimConditioning.hu Reference Range - Negative      Radiology Studies: DG Chest Portable 1 View  Result Date: 05/05/2021 CLINICAL DATA:  Shortness of breath EXAM: PORTABLE CHEST 1 VIEW COMPARISON:  04/01/2021 FINDINGS: Mild cardiomegaly. No focal opacity. No pleural effusion or pneumothorax. IMPRESSION: No active disease.  Mild cardiomegaly Electronically Signed   By: Donavan Foil M.D.   On: 05/05/2021 19:09  Scheduled Meds:  amLODipine  10 mg Oral Daily   atorvastatin  40 mg Oral q morning   carvedilol  6.25 mg Oral BID WC   dexamethasone (DECADRON) injection  6 mg Intravenous Q24H   enoxaparin (LOVENOX) injection  40 mg Subcutaneous Q24H   ferrous sulfate  325 mg Oral Q breakfast   fluticasone  1 spray Each Nare Daily   hydrALAZINE  50 mg Oral TID   insulin aspart  0-9 Units Subcutaneous TID WC   insulin glargine-yfgn  58 Units Subcutaneous Daily   mouth rinse  15 mL Mouth Rinse BID   potassium chloride SA  20 mEq Oral Daily   pregabalin  75 mg Oral BID   spironolactone  25 mg Oral Daily   torsemide  20 mg Oral Daily   Continuous Infusions:  remdesivir 100 mg in NS 100 mL 100 mg (05/06/21 1016)     LOS: 1 day   Marylu Lund, MD [facility] Pager On Amion  If 7PM-7AM, please contact night-coverage 05/06/2021, 3:39 PM

## 2021-05-06 NOTE — TOC Initial Note (Signed)
Transition of Care [facility]) - Initial/Assessment Note    Patient Details  Name: [name] [name] MRN: [id number] Date of Birth: Aug 16, 1960  Transition of Care [facility]) CM/SW Contact:    Leeroy Cha, RN Phone Number: 05/06/2021, 8:18 AM  Clinical Narrative:                 60 y.o. female with history of CHF, hypertension, diabetes mellitus, chronic kidney disease stage III was admitted last month for CHF presents to the ER because of increasing shortness of breath nasal congestion and cough for the last 3 days.  Denies chest pain.  Has had some diarrhea.   ED Course: In the ER patient is tested positive for COVID.  Chest x-ray does not show any infiltrates.  BNP was normal.  EKG shows normal sinus rhythm.  Patient admitted for further management of COVID infection given the multiple comorbidities.  Labs show creatinine of 2.1 hemoglobin 9.7 BNP of 43.2 high sensitive troponin of 3.  Patient is requiring 2 L oxygen to maintain sats.  TOC PLAN OF CARE: Following for home needs and progression.  Expected Discharge Plan: Home/Self Care Barriers to Discharge: Continued Medical Work up   Patient Goals and CMS Choice Patient states their goals for this hospitalization and ongoing recovery are:: to go home CMS Medicare.gov Compare Post Acute Care list provided to:: Patient    Expected Discharge Plan and Services Expected Discharge Plan: Home/Self Care   Discharge Planning Services: CM Consult   Living arrangements for the past 2 months: Single Family Home                                      Prior Living Arrangements/Services Living arrangements for the past 2 months: Single Family Home Lives with:: Self Patient language and need for interpreter reviewed:: Yes Do you feel safe going back to the place where you live?: Yes            Criminal Activity/Legal Involvement Pertinent to Current Situation/Hospitalization: No - Comment as needed  Activities of Daily Living Home  Assistive Devices/Equipment: Oxygen, Walker (specify type) (roll-aid) ADL Screening (condition at time of admission) Patient's cognitive ability adequate to safely complete daily activities?: Yes Is the patient deaf or have difficulty hearing?: No Does the patient have difficulty seeing, even when wearing glasses/contacts?: Yes Does the patient have difficulty concentrating, remembering, or making decisions?: Yes Patient able to express need for assistance with ADLs?: Yes Does the patient have difficulty dressing or bathing?: Yes Independently performs ADLs?: Yes (appropriate for developmental age) Does the patient have difficulty walking or climbing stairs?: Yes Weakness of Legs: Both Weakness of Arms/Hands: None  Permission Sought/Granted                  Emotional Assessment Appearance:: Appears stated age     Orientation: : Oriented to Self, Oriented to Place, Oriented to  Time, Oriented to Situation Alcohol / Substance Use: Not Applicable Psych Involvement: No (comment)  Admission diagnosis:  COVID-19 [U07.1] Acute respiratory failure due to COVID-19 ([place]) [U07.1, J96.00] Patient Active Problem List   Diagnosis Date Noted   Chronic diastolic CHF (congestive heart failure) ([place]) 05/06/2021   Acute respiratory failure due to COVID-19 ([place]) 05/06/2021   COVID-19 05/05/2021   CHF exacerbation ([place]) 04/02/2021   Chest pain 04/02/2021   Acute-on-chronic kidney injury ([place]) 04/02/2021   COPD exacerbation ([place]) 12/05/2020   COPD  with acute exacerbation ([place]) 12/04/2020   CKD stage 3 due to type 2 diabetes mellitus ([place]) 12/04/2020   Anemia of chronic disease 12/04/2020   Class 3 obesity ([place]) 07/27/2020   Acute respiratory disease due to COVID-19 virus 07/25/2020   Acute hypoxemic respiratory failure due to COVID-19 ([place]) 07/25/2020   Morbid obesity ([place]) 11/23/2019   Acute respiratory failure with hypoxia ([place]) 11/06/2019   Chronic kidney disease, stage 3b ([place])  11/06/2019   COPD with acute bronchitis ([place])    Acute kidney failure ([place]) 08/18/2019   Left ventricular hypertrophy 07/21/2019   Educated about COVID-19 virus infection 29/51/8841   Acute diastolic HF (heart failure) (HCC)    Hypoxia    Dyspnea on exertion 05/09/2019   Sleep apnea 05/09/2019   Hyperglycemia 04/09/2019   Snoring 03/13/2019   Symptomatic anemia 08/17/2018   Acute renal failure (ARF) ([place]) 08/17/2018   Chronic cystitis 08/03/2018   Diabetic neuropathy ([place]) 06/16/2017   Non compliance w medication regimen 04/12/2017   Family history of colon cancer in mother 11/17/2016   Dyspareunia in female 11/11/2016   Screen for colon cancer 11/11/2016   History of ovarian cyst 11/11/2016   Chronic hepatitis C without hepatic coma ([place]) 11/09/2016   Hyperlipidemia 10/07/2016   Insulin dependent type 2 diabetes mellitus ([place]) 01/29/2014   Status post intraocular lens implant 11/08/2013   PCO (posterior capsular opacification) 09/28/2013   Pseudophakia 09/12/2013   GERD (gastroesophageal reflux disease) 09/06/2013   Hypertension 09/06/2013   BREAST PAIN, BILATERAL 02/19/2010   ANEMIA, IRON DEFICIENCY 10/03/2009   LEUKOPENIA, MILD 09/19/2009   LIPOMA 06/18/2009   EUSTACHIAN TUBE DYSFUNCTION 06/18/2009   TINEA PEDIS 05/07/2009   SKIN LESION 05/07/2009   COUGH 05/07/2009   SUBSTANCE ABUSE, MULTIPLE 02/22/2007   HCVD (hypertensive cardiovascular disease) 02/22/2007   PCP:  [name], MD Pharmacy:   [facility] - [place], [place] - [address] [place] [address] Phone: [phone] Fax: [phone]  [facility] [address] [place] [address] Phone: [phone] Fax: [phone]     Social Determinants of Health (SDOH) Interventions    Readmission Risk Interventions Readmission Risk Prevention Plan 12/05/2020 08/18/2019 06/05/2019  Transportation Screening  Complete Complete Complete  PCP or Specialist Appt within 3-5 Days Complete - -  HRI or Home Care Consult Complete - -  Social Work Consult for [place] Planning/Counseling Not Complete - -  SW consult not completed comments RNCM assigned to this patient - -  Palliative Care Screening Not Applicable - -  Medication Review Press photographer) Complete Complete Complete  PCP or Specialist appointment within 3-5 days of discharge - Complete Complete  HRI or [place] - - Complete  SW Recovery Care/Counseling Consult - Complete Complete  [place] - Not Applicable Not Applicable  Some recent data might be hidden

## 2021-05-06 NOTE — Consult Note (Signed)
[facility] Inpatient Consult   05/06/2021  [name] [name] 10-22-1960 [id number]  [place] [facility] Care Management Program   Patient screened for hospitalization with noted extreme high risk score for unplanned readmission risk and  to assess for potential [place] Management service needs for post hospital transition. Patient is active with CCM in Managed Medicaid program with RN case manager.  Plan: Will continue to follow for progression.  Of note, [facility] Care Management services does not replace or interfere with any services that are arranged by inpatient case management or social work.   [name], MSN, [facility] Liaison Nurse Mobile Phone [phone]  Toll free office [phone]

## 2021-05-07 DIAGNOSIS — U071 COVID-19: Secondary | ICD-10-CM | POA: Diagnosis not present

## 2021-05-07 DIAGNOSIS — J9601 Acute respiratory failure with hypoxia: Secondary | ICD-10-CM | POA: Diagnosis not present

## 2021-05-07 LAB — COMPREHENSIVE METABOLIC PANEL
ALT: 10 U/L (ref 0–44)
AST: 12 U/L — ABNORMAL LOW (ref 15–41)
Albumin: 3.6 g/dL (ref 3.5–5.0)
Alkaline Phosphatase: 55 U/L (ref 38–126)
Anion gap: 8 (ref 5–15)
BUN: 57 mg/dL — ABNORMAL HIGH (ref 6–20)
CO2: 26 mmol/L (ref 22–32)
Calcium: 8.6 mg/dL — ABNORMAL LOW (ref 8.9–10.3)
Chloride: 104 mmol/L (ref 98–111)
Creatinine, Ser: 1.68 mg/dL — ABNORMAL HIGH (ref 0.44–1.00)
GFR, Estimated: 35 mL/min — ABNORMAL LOW (ref >60)
Glucose, Bld: 231 mg/dL — ABNORMAL HIGH (ref 70–99)
Potassium: 4.6 mmol/L (ref 3.5–5.1)
Sodium: 138 mmol/L (ref 135–145)
Total Bilirubin: 0.3 mg/dL (ref 0.3–1.2)
Total Protein: 7.3 g/dL (ref 6.5–8.1)

## 2021-05-07 LAB — CBC WITH DIFFERENTIAL/PLATELET
Abs Immature Granulocytes: 0.05 10*3/uL (ref 0.00–0.07)
Basophils Absolute: 0 10*3/uL (ref 0.0–0.1)
Basophils Relative: 0 %
Eosinophils Absolute: 0 10*3/uL (ref 0.0–0.5)
Eosinophils Relative: 0 %
HCT: 31.5 % — ABNORMAL LOW (ref 36.0–46.0)
Hemoglobin: 9.9 g/dL — ABNORMAL LOW (ref 12.0–15.0)
Immature Granulocytes: 1 %
Lymphocytes Relative: 21 %
Lymphs Abs: 1 10*3/uL (ref 0.7–4.0)
MCH: 27.3 pg (ref 26.0–34.0)
MCHC: 31.4 g/dL (ref 30.0–36.0)
MCV: 87 fL (ref 80.0–100.0)
Monocytes Absolute: 0.5 10*3/uL (ref 0.1–1.0)
Monocytes Relative: 11 %
Neutro Abs: 3.3 10*3/uL (ref 1.7–7.7)
Neutrophils Relative %: 67 %
Platelets: 179 10*3/uL (ref 150–400)
RBC: 3.62 MIL/uL — ABNORMAL LOW (ref 3.87–5.11)
RDW: 13.8 % (ref 11.5–15.5)
WBC: 4.8 10*3/uL (ref 4.0–10.5)
nRBC: 0 % (ref 0.0–0.2)

## 2021-05-07 LAB — D-DIMER, QUANTITATIVE: D-Dimer, Quant: <0.27 ug/mL-FEU (ref 0.00–0.50)

## 2021-05-07 LAB — GLUCOSE, CAPILLARY
Glucose-Capillary: 191 mg/dL — ABNORMAL HIGH (ref 70–99)
Glucose-Capillary: 190 mg/dL — ABNORMAL HIGH (ref 70–99)

## 2021-05-07 LAB — C-REACTIVE PROTEIN: CRP: 0.7 mg/dL (ref <1.0)

## 2021-05-07 MED ORDER — PROSOURCE PLUS PO LIQD: 30.0000 mL | Freq: Two times a day (BID) | ORAL | Status: DC

## 2021-05-07 MED ORDER — PROSOURCE PLUS PO LIQD
30.0000 mL | Freq: Two times a day (BID) | ORAL | Status: DC
  Administered 2021-05-07: 30 mL via ORAL
  Filled 2021-05-07: qty 30

## 2021-05-07 MED ORDER — DEXAMETHASONE 6 MG PO TABS: 6.0000 mg | ORAL_TABLET | Freq: Every day | ORAL | 0 refills | Status: AC

## 2021-05-07 MED ORDER — ENSURE MAX PROTEIN PO LIQD
11.0000 [oz_av] | Freq: Every day | ORAL | Status: DC
  Administered 2021-05-07: 11 [oz_av] via ORAL
  Filled 2021-05-07: qty 330

## 2021-05-07 MED ORDER — ENSURE MAX PROTEIN PO LIQD: 11.0000 [oz_av] | Freq: Two times a day (BID) | ORAL | Status: DC

## 2021-05-07 MED ORDER — ADULT MULTIVITAMIN W/MINERALS CH
1.0000 | ORAL_TABLET | Freq: Every day | ORAL | Status: DC
  Administered 2021-05-07: 1 via ORAL
  Filled 2021-05-07: qty 1

## 2021-05-07 NOTE — Progress Notes (Signed)
Initial Nutrition Assessment  DOCUMENTATION CODES:   Obesity unspecified  INTERVENTION:  - will order Ensure Max once/day, each supplement provides 150 kcal and 30 grams protein. - will order 30 ml Prosource Plus BID, each supplement provides 100 kcal and 15 grams protein.  - will order 1 tablet multivitamin with minerals/day.    NUTRITION DIAGNOSIS:   Increased nutrient needs related to acute illness, catabolic illness as evidenced by estimated needs.  GOAL:   Patient will meet greater than or equal to 90% of their needs  MONITOR:   PO intake, Supplement acceptance, Labs, Weight trends, I & O's  REASON FOR ASSESSMENT:   Malnutrition Screening Tool  ASSESSMENT:   60 y.o. female with medical history of CHF, HTN, DM, and stage 3 CKD. She was admitted 1 month ago for CHF exacerbation. She presented to the ED due to 3 day hx of increasing shortness of breath, nasal congestion, and cough. She did have some diarrhea PTA.  Patient was admitted last month with CHF exacerbation. Now admitted due to symptomatic COVID-19 infection. Symptoms began 3 days PTA and worsened over time. She tested positive on 10/18.  She was able to eat most of breakfast without issue or worsening of symptoms.   Appetite and intakes have been decreased since symptom onset.   Weight on 10/18 was 239 lb. Weight was previously stable ~253 lb (248-257 lb) from 12/04/20-04/21/21. She was last around this weight on 10/4.    Labs reviewed; CBG: 191 mg/dl, BUN: 57 mg/dl, creatinine: 1.68 mg/dl, Ca: 8.6 mg/dl, GFR: 35 ml/min. Medications reviewed; 325 ml ferrous sulfate/day, sliding scale novolog, 58 units semglee/day, 20 mEq Klor-Con/day, 100 mg IV remdesivir x1 dose/day (10/19-10/22), 25 mg aldactone/day.    NUTRITION - FOCUSED PHYSICAL EXAM:  Completed; no muscle or fat depletions noted, mild BLE edema.   Diet Order:   Diet Order             Diet heart healthy/carb modified Room service appropriate? Yes;  Fluid consistency: Thin; Fluid restriction: 1200 mL Fluid  Diet effective now                   EDUCATION NEEDS:   Not appropriate for education at this time  Skin:  Skin Assessment: Reviewed RN Assessment  Last BM:  10/19  Height:   Ht Readings from Last 1 Encounters:  05/05/21 5\' 4"  (1.626 m)    Weight:   Wt Readings from Last 1 Encounters:  05/05/21 108.3 kg     Estimated Nutritional Needs:  Kcal:  2100-2300 kcal Protein:  100-115 grams Fluid:  >/= 1.7 L/day      Jarome Matin, MS, RD, LDN, CNSC Inpatient Clinical Dietitian RD pager # available in [facility]  After hours/weekend pager # available in [facility]

## 2021-05-07 NOTE — Progress Notes (Signed)
Pt to be discharged to home today. Pt given discharge instructions including discharge Medications and schedules for these Medications . Pt verbalized understanding of all discharge instructions. Discharge AVS with pt at time of discharge. VSS and no acute changes noted with Pt's assessment at time of discharge

## 2021-05-07 NOTE — Discharge Summary (Signed)
Physician Discharge Summary  Conor Filsaime OAC:[id number] DOB: 1960/10/28 DOA: 05/05/2021  PCP: [name], MD  Admit date: 05/05/2021 Discharge date: 05/07/2021  Admitted From: Home Disposition:  Home  Recommendations for Outpatient Follow-up:  Follow up with PCP in 1-2 weeks  Discharge Condition:Improved CODE STATUS:Full Diet recommendation: Diabetic   Brief/Interim Summary: 60 y.o. female with history of CHF, hypertension, diabetes mellitus, chronic kidney disease stage III was admitted last month for CHF presents to the ER because of increasing shortness of breath nasal congestion and cough for the last 3 days.  Denies chest pain.  Has had some diarrhea.   ED Course: In the ER patient is tested positive for COVID.  Chest x-ray does not show any infiltrates.  BNP was normal.  EKG shows normal sinus rhythm.  Patient admitted for further management of COVID infection given the multiple comorbidities.  Labs show creatinine of 2.1 hemoglobin 9.7 BNP of 43.2 high sensitive troponin of 3.  Patient initially required 2 L oxygen to maintain sats  Discharge Diagnoses:  Principal Problem:   Acute hypoxemic respiratory failure due to COVID-19 [facility]) Active Problems:   Insulin dependent type 2 diabetes mellitus (HCC)   Chronic kidney disease, stage 3b (HCC)   Anemia of chronic disease   Chronic diastolic CHF (congestive heart failure) (HCC)   Acute respiratory failure due to COVID-19 [facility])   Acute respiratory failure hypoxia secondary COVID infection f Was initially continued on remdesivir and on Decadron.   Clinically improved and successfully weaned off O2 Inflammatory markers were markedly low by time of d/c Remained medically stable for d/c with close outpt f/u Chronic diastolic CHF last EF measured was 65 to 70% last month  Will continue on torsemide and spironolactone. Diabetes mellitus type 2  Pt was continued on Lantus insulin since patient is on Decadron closely follow  CBGs.   Glycemic trends remained stable Chronic kidney disease stage III  creatinine appears to be at baseline at time of admit Chronic anemia  Remained stable Hypertension Pt is on amlodipine and hydralazine and Coreg.  BP remained stable    Discharge Instructions   Allergies as of 05/07/2021       Reactions   Lisinopril Swelling        Medication List     STOP taking these medications    doxycycline 100 MG capsule Commonly known as: VIBRAMYCIN       TAKE these medications    albuterol 108 (90 Base) MCG/ACT inhaler Commonly known as: VENTOLIN HFA Inhale 1 puff into the lungs every 4 (four) hours as needed for wheezing or shortness of breath.   albuterol (2.5 MG/3ML) 0.083% nebulizer solution Commonly known as: PROVENTIL Take 3 mLs (2.5 mg total) by nebulization every 4 (four) hours as needed for wheezing or shortness of breath.   amLODipine 10 MG tablet Commonly known as: NORVASC Take 1 tablet (10 mg total) by mouth daily.   atorvastatin 40 MG tablet Commonly known as: LIPITOR Take 1 tablet (40 mg total) by mouth every morning. Must have HF appt for further refills   blood glucose meter kit and supplies Kit Dispense based on patient and insurance preference. Use up to four times daily as directed. (FOR ICD-9 250.00, 250.01).   carvedilol 6.25 MG tablet Commonly known as: COREG Take 1 tablet (6.25 mg total) by mouth 2 (two) times daily with a meal.   dexamethasone 6 MG tablet Commonly known as: DECADRON Take 1 tablet (6 mg total) by mouth daily for 5  days.   DRY EYES OP Place 1 drop into both eyes daily as needed (dry eyes).   ferrous sulfate 325 (65 FE) MG tablet Take 325 mg by mouth daily with breakfast.   fluticasone 110 MCG/ACT inhaler Commonly known as: FLOVENT HFA Inhale 2 puffs into the lungs 2 (two) times daily.   hydrALAZINE 50 MG tablet Commonly known as: APRESOLINE Take 1 tablet (50 mg total) by mouth 3 (three) times daily.    Lantus SoloStar 100 UNIT/ML Solostar Pen Generic drug: insulin glargine Inject 58 Units into the skin daily.   Misc. Devices Misc 2 L of oxygen via nasal cannula.  Diagnosis-chronic respiratory failure with hypoxia   potassium chloride SA 20 MEQ tablet Commonly known as: KLOR-CON Take 1 tablet (20 mEq total) by mouth daily.   pregabalin 75 MG capsule Commonly known as: LYRICA TAKE ONE CAPSULE BY MOUTH EVERY MORNING and TAKE ONE CAPSULE BY MOUTH EVERY EVENING What changed:  how much to take how to take this when to take this additional instructions   spironolactone 25 MG tablet Commonly known as: ALDACTONE Take 1 tablet (25 mg total) by mouth daily.   Torsemide 40 MG Tabs Take 40 mg by mouth daily for 5 days, THEN 40 mg every other day. Start taking on: April 13, 2021 What changed: See the new instructions.   Victoza 18 MG/3ML Sopn Generic drug: liraglutide INJECT 1.8 MG into THE SKIN DAILY AS DIRECTED What changed: See the new instructions.        Follow-up Information     [name], MD Follow up in 2 week(s).   Specialty: Family Medicine Why: Hospital follow up Contact information: [place] [address] [phone]         Minus Breeding, MD .   Specialty: Cardiology Contact information: [address] [place] [address] [phone]                Allergies  Allergen Reactions   Lisinopril Swelling   Procedures/Studies: DG Chest Portable 1 View  Result Date: 05/05/2021 CLINICAL DATA:  Shortness of breath EXAM: PORTABLE CHEST 1 VIEW COMPARISON:  04/01/2021 FINDINGS: Mild cardiomegaly. No focal opacity. No pleural effusion or pneumothorax. IMPRESSION: No active disease.  Mild cardiomegaly Electronically Signed   By: Donavan Foil M.D.   On: 05/05/2021 19:09    Subjective: Eager to go home  Discharge Exam: Vitals:   05/07/21 1320 05/07/21 1510  BP:  135/72  Pulse:    Resp:    Temp:     SpO2: 96%    Vitals:   05/07/21 0946 05/07/21 1102 05/07/21 1320 05/07/21 1510  BP: 132/74   135/72  Pulse:      Resp:      Temp:      TempSrc:      SpO2:  95% 96%   Weight:      Height:        General: Pt is alert, awake, not in acute distress Cardiovascular: RRR, S1/S2 + Respiratory: CTA bilaterally, no wheezing, no rhonchi Abdominal: Soft, NT, ND, bowel sounds + Extremities: no edema, no cyanosis   The results of significant diagnostics from this hospitalization (including imaging, microbiology, ancillary and laboratory) are listed below for reference.     Microbiology: Recent Results (from the past 240 hour(s))  SARS Coronavirus 2 (TAT 6-24 hrs)     Status: Abnormal   Collection Time: 05/04/21 12:00 AM  Result Value Ref Range Status   SARS Coronavirus  2 RESULT: POSITIVE (A)  Final    Comment: RESULT: POSITIVESARS-CoV-2 INTERPRETATION:A POSITIVE  test result means that SARS-CoV-2 RNA was present in the specimen above the limit of detection of this test. The presence of SARS-CoV-2 RNA is indicative of infection with SARS-CoV-2. Detection of  SARS-CoV-2 RNA may not rule-out bacterial infection or co-infection with other viruses. Positive and negative predictive values of testing are highly dependent on prevalence. False positive test results are more likely when prevalence of disease is  low.The expected result is NEGATIVE.Fact Sheet for Healthcare Providers: SisterViews.hu Sheet for Patients: SwimConditioning.hu Reference Range - Negative      Labs: BNP (last 3 results) Recent Labs    04/01/21 1242 04/13/21 1600 05/05/21 1920  BNP 92.8 14.9 30.0   Basic Metabolic Panel: Recent Labs  Lab 05/05/21 1920 05/06/21 0350 05/07/21 0353  NA 144  --  138  K 4.2  --  4.6  CL 105  --  104  CO2 30  --  26  GLUCOSE 96  --  231*  BUN 49*  --  57*  CREATININE 2.16* 1.71* 1.68*  CALCIUM 8.7*  --  8.6*   Liver  Function Tests: Recent Labs  Lab 05/05/21 1920 05/07/21 0353  AST 12* 12*  ALT 8 10  ALKPHOS 53 55  BILITOT 0.3 0.3  PROT 7.0 7.3  ALBUMIN 4.0 3.6   No results for input(s): LIPASE, AMYLASE in the last 168 hours. No results for input(s): AMMONIA in the last 168 hours. CBC: Recent Labs  Lab 05/05/21 1920 05/06/21 0350 05/07/21 0353  WBC 3.7* 4.2 4.8  NEUTROABS 1.7  --  3.3  HGB 9.7* 10.2* 9.9*  HCT 31.9* 34.1* 31.5*  MCV 87.2 88.8 87.0  PLT 159 157 179   Cardiac Enzymes: No results for input(s): CKTOTAL, CKMB, CKMBINDEX, TROPONINI in the last 168 hours. BNP: Invalid input(s): POCBNP CBG: Recent Labs  Lab 05/06/21 1105 05/06/21 1637 05/06/21 2035 05/07/21 0722 05/07/21 1205  GLUCAP 223* 258* 216* 191* 190*   D-Dimer Recent Labs    05/05/21 1920 05/07/21 0353  DDIMER 0.37 <0.27   Hgb A1c No results for input(s): HGBA1C in the last 72 hours. Lipid Profile No results for input(s): CHOL, HDL, LDLCALC, TRIG, CHOLHDL, LDLDIRECT in the last 72 hours. Thyroid function studies No results for input(s): TSH, T4TOTAL, T3FREE, THYROIDAB in the last 72 hours.  Invalid input(s): FREET3 Anemia work up No results for input(s): VITAMINB12, FOLATE, FERRITIN, TIBC, IRON, RETICCTPCT in the last 72 hours. Urinalysis    Component Value Date/Time   COLORURINE STRAW (A) 04/09/2019 1615   APPEARANCEUR CLOUDY (A) 04/09/2019 1615   LABSPEC 1.010 04/09/2019 1615   PHURINE 5.0 04/09/2019 1615   GLUCOSEU >=500 (A) 04/09/2019 1615   HGBUR SMALL (A) 04/09/2019 1615   HGBUR negative 06/29/2010 1356   BILIRUBINUR NEGATIVE 04/09/2019 1615   KETONESUR NEGATIVE 04/09/2019 1615   PROTEINUR 100 (A) 04/09/2019 1615   UROBILINOGEN 1.0 01/26/2015 0925   NITRITE NEGATIVE 04/09/2019 1615   LEUKOCYTESUR SMALL (A) 04/09/2019 1615   Sepsis Labs Invalid input(s): PROCALCITONIN,  WBC,  LACTICIDVEN Microbiology Recent Results (from the past 240 hour(s))  SARS Coronavirus 2 (TAT 6-24 hrs)      Status: Abnormal   Collection Time: 05/04/21 12:00 AM  Result Value Ref Range Status   SARS Coronavirus 2 RESULT: POSITIVE (A)  Final    Comment: RESULT: POSITIVESARS-CoV-2 INTERPRETATION:A POSITIVE  test result means that SARS-CoV-2 RNA was present in the specimen above the limit  of detection of this test. The presence of SARS-CoV-2 RNA is indicative of infection with SARS-CoV-2. Detection of  SARS-CoV-2 RNA may not rule-out bacterial infection or co-infection with other viruses. Positive and negative predictive values of testing are highly dependent on prevalence. False positive test results are more likely when prevalence of disease is  low.The expected result is NEGATIVE.Fact Sheet for Healthcare Providers: SisterViews.hu Sheet for Patients: SwimConditioning.hu Reference Range - Negative    Time spent: 30 min  SIGNED:   Marylu Lund, MD  [facility] 05/07/2021, 3:12 PM  If 7PM-7AM, please contact night-coverage

## 2021-05-08 ENCOUNTER — Telehealth: Payer: Self-pay

## 2021-05-08 ENCOUNTER — Other Ambulatory Visit: Payer: Self-pay | Admitting: Obstetrics and Gynecology

## 2021-05-08 NOTE — Patient Instructions (Signed)
Hi [name] [name], I am sorry I missed you today, I hope you are feeling better - as a part of your Medicaid benefit, you are eligible for care management and care coordination services at no cost or copay. I was unable to reach you by phone today but would be happy to help you with your health related needs. Please feel free to call me at [phone].  A member of the Managed Medicaid care management team will reach out to you again over the next 7-14 days.   [name] RN, BSN [place]  [facility] - Managed Medicaid High Risk [phone].

## 2021-05-08 NOTE — Telephone Encounter (Signed)
Transition Care Management Follow-up Telephone Call Date of discharge and from where: 05/07/2021-[place]  How have you been since you were released from the hospital? Patient stated she is doing ok.  Any questions or concerns? No  Items Reviewed: Did the pt receive and understand the discharge instructions provided? Yes  Medications obtained and verified? Yes  Other? No  Any new allergies since your discharge? No  Dietary orders reviewed? No Do you have support at home? Yes   Home Care and Equipment/Supplies: Were home health services ordered? not applicable If so, what is the name of the agency? N/A  Has the agency set up a time to come to the patient's home? not applicable Were any new equipment or medical supplies ordered?  No What is the name of the medical supply agency? N/A Were you able to get the supplies/equipment? not applicable Do you have any questions related to the use of the equipment or supplies? No  Functional Questionnaire: (I = Independent and D = Dependent) ADLs: I  Bathing/Dressing- I  Meal Prep- I  Eating- I   Transferring/Ambulation- I  Managing Meds- I  Follow up appointments reviewed:  PCP Hospital f/u appt confirmed? No   Specialist Hospital f/u appt confirmed? No   Are transportation arrangements needed? No  If their condition worsens, is the pt aware to call PCP or go to the Emergency Dept.? Yes Was the patient provided with contact information for the PCP's office or ED? Yes Was to pt encouraged to call back with questions or concerns? Yes

## 2021-05-08 NOTE — Telephone Encounter (Signed)
Transition Care Management Follow-up Telephone Call   Date of discharge and from where: [facility] 05/07/2021 How have you been since you were released from the hospital? Doing ok Any questions or concerns? Pt stated has requested to restart  O2  at home prior to [place] hospitalization but no oxygen was delivered so far. Call made to [place], advocate stated has no order for home O2  . Explained that order was placed from 04/21/2021 by Dr Margarita Rana . Stated he will f/u on it. Will make Jane CM aware . Advised pt to monitor O2 sats and if SOB, diff breathing or O2 sats falls below normal to refer to the  ED. Pt stated she is just congested and has been using her nasal spray and OTC meds as needed.  Items Reviewed Did the pt receive and understand the discharge instructions provided? have the instructions and have no questions.  Medications obtained and verified? She said that she have the medication list  and the hospital staff reviewed them in detail prior to discharge. She said that he has all of the medications and they have no questions.  Any new allergies since your discharge? None reported  Do you have support at home? Yes, friend Other (ie: DME, Home Health, etc)       none Functional Questionnaire: (I = Independent and D = Dependent) ADL's:  Independent.        Follow up appointments reviewed:   PCP Hospital f/u appt confirmed? [facility] [place] on 05/25/2021.  [facility] confirmed? scheduled at this time  Are transportation arrangements needed? have transportation   If their condition worsens, is the pt aware to call  their PCP or go to the ED? Yes.Made pt aware if condition worsen or start experiencing rapid weight gain, chest pain, diff breathing, SOB, high fevers, or bleading to refer imediately to ED for further evaluation.  Was the patient provided with contact information for the PCP's office or ED? She has the phone number  Was the pt encouraged to  call back with questions or concerns?yes

## 2021-05-08 NOTE — Patient Outreach (Signed)
Care Coordination  05/08/2021  [name] 05-16-1961 [id number]   Medicaid Managed Care   Unsuccessful Outreach Note  05/08/2021 Name: [name] [name] MRN: [id number] DOB: 1961/05/13  Referred by: [name], MD Reason for referral : High Risk Managed Medicaid (Unsuccessful telephone outreach)   An unsuccessful telephone outreach was attempted today. The patient was referred to the case management team for assistance with care management and care coordination.   Follow Up Plan: The care management team will reach out to the patient again over the next 7-14 days.   [name] RN, BSN [place]  [facility] - Managed Medicaid High Risk [phone].

## 2021-05-11 ENCOUNTER — Telehealth: Payer: Self-pay | Admitting: *Deleted

## 2021-05-11 NOTE — Telephone Encounter (Signed)
Copied from [place] [id number]. Topic: General - Other >> May 08, 2021 12:37 PM Bayard Beaver wrote: Reason for CRM: Patient called in about getting a wheelchair, states she mentioned before and didn't hear back about this. Please call back

## 2021-05-13 ENCOUNTER — Other Ambulatory Visit: Payer: Self-pay | Admitting: Family Medicine

## 2021-05-13 DIAGNOSIS — Z794 Long term (current) use of insulin: Secondary | ICD-10-CM

## 2021-05-13 NOTE — Telephone Encounter (Signed)
Call returned to patient #  [phone], the recording is in Spanish. Per Wynonia Hazard, [facility] Financial Counselor, the message states that the voicemail is full.  He sent message with return phone # [phone].  Need to inquire about patient's request for wheelchair.

## 2021-05-13 NOTE — Telephone Encounter (Signed)
Requested medication (s) are due for refill today: yes  Requested medication (s) are on the active medication list:yes  Last refill: 12/10/20  #60  4 refills  Future visit scheduled yes  05/25/21  Notes to clinic:not delegated  Requested Prescriptions  Pending Prescriptions Disp Refills   pregabalin (LYRICA) 75 MG capsule [Pharmacy Med Name: pregabalin 75 mg capsule] 60 capsule 4    Sig: TAKE ONE CAPSULE BY MOUTH TWICE DAILY     Not Delegated - Neurology:  Anticonvulsants - Controlled Failed - 05/13/2021  8:03 AM      Failed - This refill cannot be delegated      Passed - Valid encounter within last 12 months    Recent Outpatient Visits           3 weeks ago Chronic respiratory failure with hypoxia ([place])   [place], Charlane Ferretti, MD   1 month ago Encounter for [employer] annual wellness exam   [place], [place], MD   2 months ago Type 2 diabetes mellitus with other neurologic complication, with long-term current use of insulin ([place])   [place], [place], MD   11 months ago Type 2 diabetes mellitus with stage 3b chronic kidney disease, with long-term current use of insulin ([place])   [place], [place], MD   1 year ago Type 2 diabetes mellitus with stage 3b chronic kidney disease, with long-term current use of insulin ([place])   [place], MD       Future Appointments             In 1 week [name], MD [place]   In 1 month [place], Jeneen Rinks, MD [place], [facility]

## 2021-05-15 ENCOUNTER — Other Ambulatory Visit: Payer: Self-pay | Admitting: Family Medicine

## 2021-05-15 DIAGNOSIS — E1149 Type 2 diabetes mellitus with other diabetic neurological complication: Secondary | ICD-10-CM

## 2021-05-15 DIAGNOSIS — Z794 Long term (current) use of insulin: Secondary | ICD-10-CM

## 2021-05-20 ENCOUNTER — Other Ambulatory Visit: Payer: Self-pay | Admitting: Obstetrics and Gynecology

## 2021-05-20 NOTE — Patient Outreach (Signed)
Care Coordination  05/20/2021  [name] 12-26-1960 [id number]   Medicaid Managed Care   Unsuccessful Outreach Note  05/20/2021 Name: [name] [name] MRN: [id number] DOB: [birth year]  Referred by: [name], MD Reason for referral : High Risk Managed Medicaid (Unsuccessful telephone outreach)   A second unsuccessful telephone outreach was attempted today. The patient was referred to the case management team for assistance with care management and care coordination.   Follow Up Plan: The care management team will reach out to the patient again over the next 7 days.   [name] RN, BSN [place]  [facility]ator - Managed Medicaid High Risk [phone].

## 2021-05-20 NOTE — Patient Instructions (Signed)
Hi Ms. Balzarini, sorry I have missed you today-I hope you are doing okay. - as a part of your Medicaid benefit, you are eligible for care management and care coordination services at no cost or copay. I was unable to reach you by phone today but would be happy to help you with your health related needs. Please feel free to call me at [phone].  A member of the Managed Medicaid care management team will reach out to you again over the next 7 days.   [name] RN, BSN [place]  [facility] [employer]ator - Managed Medicaid High Risk [phone].

## 2021-05-22 ENCOUNTER — Ambulatory Visit
Admission: RE | Admit: 2021-05-22 | Discharge: 2021-05-22 | Disposition: A | Discharge status: Home / Self Care | Payer: Medicare Other | Source: Ambulatory Visit | Attending: Family Medicine | Admitting: Family Medicine

## 2021-05-22 ENCOUNTER — Other Ambulatory Visit: Payer: Self-pay

## 2021-05-22 DIAGNOSIS — Z1231 Encounter for screening mammogram for malignant neoplasm of breast: Secondary | ICD-10-CM

## 2021-05-25 ENCOUNTER — Inpatient Hospital Stay (HOSPITAL_COMMUNITY): Admission: RE | Admit: 2021-05-25 | Payer: Medicare Other | Source: Ambulatory Visit

## 2021-05-25 ENCOUNTER — Inpatient Hospital Stay: Payer: Medicare Other | Admitting: Family Medicine

## 2021-05-28 NOTE — Telephone Encounter (Signed)
Attempted to contact patient again. Message left with call back requested to this CM

## 2021-05-29 ENCOUNTER — Telehealth: Payer: Self-pay | Admitting: Family Medicine

## 2021-05-29 NOTE — Telephone Encounter (Signed)
..   Medicaid Managed Care   Unsuccessful Outreach Note  05/29/2021 Name: COLA GANE MRN: [id number] DOB: Apr 19, 1961  Referred by: [name], MD Reason for referral : High Risk Managed Medicaid (I called the patient to get her rescheduled for phone visits with the [facility] and the Pharmacist.)   An unsuccessful telephone outreach was attempted today. The patient was referred to the case management team for assistance with care management and care coordination.   Follow Up Plan: The care management team will reach out to the patient again over the next 7 days.   SIGNATURE

## 2021-06-08 ENCOUNTER — Other Ambulatory Visit: Payer: Self-pay | Admitting: Family Medicine

## 2021-06-08 NOTE — Telephone Encounter (Signed)
Requested Prescriptions  Pending Prescriptions Disp Refills  . [place] HFA 110 MCG/ACT inhaler [Pharmacy Med Name: Flovent HFA 110 mcg/actuation aerosol inhaler] 12 g 3    Sig: INHALE TWO PUFFS BY MOUTH INTO LUNGS TWICE DAILY     Pulmonology:  Corticosteroids Passed - 06/08/2021 12:40 PM      Passed - Valid encounter within last 12 months    Recent Outpatient Visits          1 month ago Chronic respiratory failure with hypoxia ([place])   [place], Charlane Ferretti, MD   2 months ago Encounter for [employer] annual wellness exam   [place], [place], MD   3 months ago Type 2 diabetes mellitus with other neurologic complication, with long-term current use of insulin ([place])   [place], [place], MD   1 year ago Type 2 diabetes mellitus with stage 3b chronic kidney disease, with long-term current use of insulin ([place])   [place], [place], MD   1 year ago Type 2 diabetes mellitus with stage 3b chronic kidney disease, with long-term current use of insulin ([place])   [place], [place], MD      Future Appointments            In 2 weeks Minus Breeding, MD [place], [facility]

## 2021-06-19 ENCOUNTER — Other Ambulatory Visit: Payer: Self-pay | Admitting: Family

## 2021-06-19 ENCOUNTER — Telehealth: Payer: Self-pay | Admitting: Family Medicine

## 2021-06-19 DIAGNOSIS — Z794 Long term (current) use of insulin: Secondary | ICD-10-CM

## 2021-06-19 DIAGNOSIS — E1149 Type 2 diabetes mellitus with other diabetic neurological complication: Secondary | ICD-10-CM

## 2021-06-19 NOTE — Telephone Encounter (Signed)
..   Medicaid Managed Care   Unsuccessful Outreach Note  06/19/2021 Name: [name] [name] MRN: [id number] DOB: 10-29-1960  Referred by: [name], MD Reason for referral : High Risk Managed Medicaid (I called patient today to get her phone visits with the [facility] and the Pharmacist rescheduled. I left my name and number on her VM.)   A second unsuccessful telephone outreach was attempted today. The patient was referred to the case management team for assistance with care management and care coordination.   Follow Up Plan: The care management team will reach out to the patient again over the next 7 days.  [place]

## 2021-06-25 NOTE — Progress Notes (Deleted)
Cardiology Office Note   Date:  06/25/2021   ID:  [name] [name], DOB 1960-10-28, MRN [id number]  PCP:  [name], MD  Cardiologist:   Minus Breeding, MD Referring:  [name], MD   No chief complaint on file.     History of Present Illness: [name] [name] is a 60 y.o. female who is referred for evaluation of SOB.    Since I last saw her she was in the hospital with Covid.  I reviewed these records for this visit. She had respiratory failure and she was thought to have an element of diastolic HF.  She has CKD IIIa.    ***       *** Acute respiratory failure hypoxia secondary COVID infection f Was initially continued on remdesivir and on Decadron.   Clinically improved and successfully weaned off O2 Inflammatory markers were markedly low by time of d/c Remained medically stable for d/c with close outpt f/u Chronic diastolic CHF last EF measured was 65 to 70% last month  Will continue on torsemide and spironolactone. Diabetes mellitus type 2  Pt was continued on Lantus insulin since patient is on Decadron closely follow CBGs.   Glycemic trends remained stable Chronic kidney disease stage III  creatinine appears to be at baseline at time of admit Chronic anemia  Remained stable Hypertension Pt is on amlodipine and hydralazine and Coreg.  BP remained stable   She had an echo with a mildly reduced EF of 50%.  There is moderate concentric hypertrophy.  We saw her in the past because of difficult to control hypertension and poorly controlled sugars.   She actually feels better than she did previously.  Her blood sugars are better controlled.  Today her blood pressure is as recorded below.  She does not feel lightheaded and actually feels well.  She is having trouble using her machine.  She is unable to use a BP machine that she has at home.  She is not sure what her BP is.  She is feeling better with her breathing.  The patient denies any new symptoms such as chest  discomfort, neck or arm discomfort. There has been no new shortness of breath, PND or orthopnea. There have been no reported palpitations, presyncope or syncope.  Her last echo in November demonstrated that her ejection fraction is well-preserved.  There was actually no left ventricular hypertrophy.   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 75 mL 0   albuterol (VENTOLIN HFA) 108 (90 Base) MCG/ACT inhaler Inhale 1 puff into the lungs every 4 (four) hours as needed for wheezing or shortness of breath. 18 g  0   amLODipine (NORVASC) 10 MG tablet Take 1 tablet (10 mg total) by mouth daily. 90 tablet 3   Artificial Tear Ointment (DRY EYES OP) Place 1 drop into both eyes daily as needed (dry eyes).     atorvastatin (LIPITOR) 40 MG tablet Take 1 tablet (40 mg total) by mouth every morning. Must have HF appt for further refills 90 tablet 0   blood glucose meter kit and supplies KIT Dispense based on patient and insurance preference. Use up to four times daily as directed. (FOR ICD-9 250.00, 250.01). 1 each 2   carvedilol (COREG) 6.25 MG tablet Take 1 tablet (6.25 mg total) by mouth 2 (two) times daily with a meal. 60 tablet 0   ferrous sulfate 325 (65 FE) MG  tablet Take 325 mg by mouth daily with breakfast. (Patient not taking: Reported on 05/05/2021)     FLOVENT HFA 110 MCG/ACT inhaler INHALE TWO PUFFS BY MOUTH INTO LUNGS TWICE DAILY 12 g 3   hydrALAZINE (APRESOLINE) 50 MG tablet Take 1 tablet (50 mg total) by mouth 3 (three) times daily. 180 tablet 1   insulin glargine (LANTUS SOLOSTAR) 100 UNIT/ML Solostar Pen Inject 58 Units into the skin daily. 30 mL 6   Misc. Devices MISC 2 L of oxygen via nasal cannula.  Diagnosis-chronic respiratory failure with hypoxia 1 each 0   potassium chloride SA (KLOR-CON) 20 MEQ tablet Take 1 tablet (20 mEq total) by mouth daily. 30 tablet 0   pregabalin (LYRICA) 75 MG capsule TAKE ONE CAPSULE BY MOUTH TWICE DAILY 60 capsule 1   spironolactone (ALDACTONE) 25 MG tablet Take 1 tablet (25 mg total) by mouth daily. 90 tablet 1   Torsemide 40 MG TABS Take 40 mg by mouth daily for 5 days, THEN 40 mg every other day. (Patient taking differently: Take 32m by mouth every other day) 45 tablet 6   VICTOZA 18 MG/3ML SOPN INJECT 1.8 MG into THE SKIN ONCE DAILY AS DIRECTED 9 mL 0   No current facility-administered medications for this visit.    Allergies:   Lisinopril    ROS:  Please see the history of present illness.   Otherwise, review of systems are positive for ***.   All other systems are reviewed and negative.    PHYSICAL EXAM: VS:  LMP 04/01/2010  , BMI There is no height or weight on file to calculate BMI. GENERAL:  Well appearing NECK:  No jugular venous distention, waveform within normal limits, carotid upstroke brisk and symmetric, no bruits, no thyromegaly LUNGS:  Clear to auscultation bilaterally CHEST:  Unremarkable HEART:  PMI not displaced or sustained,S1 and S2 within normal limits, no S3, no S4, no clicks, no rubs, *** murmurs ABD:  Flat, positive bowel sounds normal in frequency in pitch, no bruits, no rebound, no guarding, no midline pulsatile mass, no hepatomegaly, no splenomegaly EXT:  2 plus pulses  throughout, no edema, no cyanosis no clubbing    ***GENERAL:  Well appearing NECK:  No jugular venous distention, waveform within normal limits, carotid upstroke brisk and symmetric, no bruits, no thyromegaly LUNGS:  Clear to auscultation bilaterally CHEST:  Unremarkable HEART:  PMI not displaced or sustained,S1 and S2 within normal limits, no S3, no S4, no clicks, no rubs, soft apical systolic murmur heard best at the left upper sternal border, no diastolic murmurs ABD:  Flat, positive bowel sounds normal in frequency in pitch, no bruits, no rebound, no guarding, no midline pulsatile mass, no hepatomegaly, no splenomegaly EXT:  2 plus pulses throughout, no edema, no cyanosis no clubbing   EKG:  EKG is *** ordered today. Sinus rhythm, rate ***, axis within normal limits, intervals within normal limits, no acute ST-T wave changes.  Recent Labs: 04/02/2021: Magnesium 1.8 05/05/2021: B Natriuretic Peptide 43.2 05/07/2021: ALT 10; BUN 57; Creatinine, Ser 1.68; Hemoglobin 9.9; Platelets 179; Potassium 4.6; Sodium 138    Lipid Panel    Component Value Date/Time   CHOL 147 01/29/2019 1118   TRIG 102 01/29/2019 1118   HDL 59 01/29/2019 1118   CHOLHDL 2.5 01/29/2019 1118   CHOLHDL 3 06/18/2014 0951   VLDL 19.6 06/18/2014 0951   LDLCALC 68 01/29/2019 1118      Wt Readings from Last 3 Encounters:  05/05/21 238 lb 12.1 oz (108.3 kg)  04/21/21 251 lb (113.9 kg)  04/13/21 251 lb 9.6 oz (114.1 kg)      Other studies Reviewed: Additional studies/ records that were reviewed today include: *** Review of the above records demonstrates:  ***   ASSESSMENT AND PLAN:   CHRONIC DIASTOLIC HF:  ***  DM:    A1c was ***     The blood sugars poorly controlled with an A1c of 10.  This is improved from 13.9.   HTN:     BP is *** actually low today.  She feels fine.  We brought in a blood pressure cuff that demonstrated how to use it.  It turns out she has the same 1 at home.  She has no LVH so  I have no plans for further imaging.  She needs weight loss and continued salt restriction and increased physical activity.  DYSPNEA:   ***  This is multifactorial.  She is feeling better.  No change in therapy.  OBESITY:   ***  We talked about weight loss.  SLEEP APNEA:   ***  I had our sleep apnea nurse, and speak with her.  She has kept the night approval so far for her BiPAP.  We are trying to take care of this for her.   CKD: Creatinine was *** 1.4.  No change in therapy.    Current medicines are reviewed at length with the patient today.  The patient does not have concerns regarding medicines.  The following changes have been made:  None  Labs/ tests ordered today include: None  No orders of the defined types were placed in this encounter.    Disposition:   FU with APP in six months    Signed, Minus Breeding, MD  06/25/2021 9:18 PM    [facility]

## 2021-06-25 NOTE — Telephone Encounter (Signed)
Attempted to contact the patient again #  [phone], message left with call back requested to this CM.   Need to inquire about patient's request for wheelchair.

## 2021-06-26 ENCOUNTER — Ambulatory Visit: Payer: Medicare Other | Admitting: Cardiology

## 2021-06-26 ENCOUNTER — Other Ambulatory Visit: Payer: Self-pay

## 2021-06-26 ENCOUNTER — Other Ambulatory Visit: Payer: Self-pay | Admitting: Obstetrics and Gynecology

## 2021-06-26 DIAGNOSIS — I5032 Chronic diastolic (congestive) heart failure: Secondary | ICD-10-CM

## 2021-06-26 DIAGNOSIS — I10 Essential (primary) hypertension: Secondary | ICD-10-CM

## 2021-06-26 DIAGNOSIS — R0602 Shortness of breath: Secondary | ICD-10-CM

## 2021-06-26 NOTE — Patient Instructions (Signed)
Hi [name] [name] you for speaking with me today, have a great afternoon and weekend!!  [name] [name] was given information about Medicaid Managed Care team care coordination services as a part of their [place] Medicaid benefit. KARLITA LICHTMAN verbally consented to engagement with the [facility] Managed Care team.   If you are experiencing a medical emergency, please call 911 or report to your local emergency department or urgent care.   If you have a non-emergency medical problem during routine business hours, please contact your provider's office and ask to speak with a nurse.   For questions related to your [facility], please call: [phone] or visit the homepage here: [email or web address]  If you would like to schedule transportation through your [facility], please call the following number at least 2 days in advance of your appointment: [phone].   Call the [place] at [phone], at any time, 24 hours a day, 7 days a week. If you are in danger or need immediate medical attention call 911.  If you would like help to quit smoking, call 1-800-QUIT-NOW 5812125993) OR Espaol: 1-855-Djelo-Ya (4-132-440-1027) o para ms informacin haga clic aqu or Text READY to 200-400 to register via text  [name] - following are the goals we discussed in your visit today:   Goals Addressed             This Visit's Progress    Protect My Health       Timeframe:  Long-Range Goal Priority:  Medium Start Date:          10/29/20                   Expected End Date: ongoing             Follow Up Date: 07/27/21   - schedule appointment for flu shot - schedule appointment for vaccines needed due to my age or health - schedule recommended health tests (blood work, mammogram, colonoscopy, pap test) - schedule and keep appointment for  annual check-up   06/26/21:  patient recently hospitalized 05/05/21-05/07/21 for respiratory failure.  Missed cardiology appointment today-rescheduled.   Why is this important?   Screening tests can find diseases early when they are easier to treat.  Your doctor or nurse will talk with you about which tests are important for you.  Getting shots for common diseases like the flu and shingles will help prevent them.       The patient verbalized understanding of instructions provided today and declined a print copy of patient instruction materials.   The Managed Medicaid care management team will reach out to the patient again over the next 30 days.  The  Patient  has been provided with contact information for the Managed Medicaid care management team and has been advised to call with any health related questions or concerns.   [name] RN, BSN [place]  [facility] Curator - Managed Medicaid High Risk [phone].   Following is a copy of your plan of care:  Care Plan : General Plan of Care (Adult)  Updates made by Gayla Medicus, RN since 06/26/2021 12:00 AM     Problem: Therapeutic Alliance (General Plan of Care)   Priority: Medium  Onset Date: 05/28/2020     Long-Range Goal: Therapeutic Alliance Established   Start Date: 10/29/2020  Expected End Date: 09/24/2021  Recent Progress: On track  Priority: Medium  Note:   Evidence-based guidance:  Avoid value judgments; convey acceptance.  Encourage collaboration with the treatment team.  Establish rapport; develop trust relationship.  Therapist, art.  Provide emotional support; encourage patient to share feelings of anger, fear and anxiety.  Promote self-reliance and autonomy based on age and ability; discourage overprotection.  Use empathy and nonjudgmental, participatory manner.     Problem: Quality of Life (General Plan of Care)   Priority: High  Onset Date: 10/29/2020      Long-Range Goal: Quality of Life Maintained   Start Date: 10/29/2020  Expected End Date: 09/24/2021  Recent Progress: Not on track  Priority: High  Note:    CARE PLAN ENTRY Medicaid Managed Care (see longitudinal plan of care for additional care plan information)  Current Barriers:  Patient recently hospitalized 05/05/21-05/07/21 with respiratory failure.  Patient states that she has SOB with activity-does not have any Oxygen,  C/O leg selling today after having salt.  Missed Cardiology appointment today and rescheduled.  Needs dental resources as she broke her front tooth while eating.  Nurse Case Manager Clinical Goal(s):  Over the next 30 days, patient will verbalize understanding of plan for decreased swelling.  Over the next 30 days, patient will work with provider to address needs related to swelling Over the next 30 days, patient will continue to work with Pharmacist. Over the next 30 days, patient will attend all scheduled appointments. Over the next 30 days, patient will receive dental resources.  Interventions:  Inter-disciplinary care team collaboration (see longitudinal plan of care) Reviewed medications with patient. Discussed plans with patient for ongoing care management follow up and provided patient with direct contact information for care management team. Collaboration with Pharmacist for review of medications. Pharmacy referral for medication review. Collaborated with patient's PCP office to let them know about SOB and no Oxygen. Collaborated with Care Guide for dental resources. Care Guide Referral for dental resources.  Plan:  Patient will follow up with provider and fill all prescriptions RNCM will follow up with patient within 30 days.  Evidence-based guidance:  Assess patient's thoughts about quality of life, goals and expectations, and dissatisfaction or desire to improve.  Identify issues of primary importance such as mental health, illness, exercise  tolerance, pain, sexual function and intimacy, cognitive change, social isolation, finances and relationships.  Assess and monitor for signs/symptoms of psychosocial concerns, especially depression or ideations regarding harm to others or self; provide or refer for mental health services as needed.  Identify sensory issues that impact quality of life such as hearing loss, vision deficit; strategize ways to maintain or improve hearing, vision.  Promote access to services in the community to support independence such as support groups, home visiting programs, financial assistance, handicapped parking tags, durable medical equipment and emergency responder.  Promote activities to decrease social isolation such as group support or social, leisure and recreational activities, employment, use of social media; consider safety concerns about being out of home for activities.  Provide patient an opportunity to share by storytelling or a "life review" to give positive meaning to life and to assist with coping and negative experiences.  Encourage patient to tap into hope to improve sense of self.  Counsel based on prognosis and as early as possible about end-of-life and palliative care; consider referral to palliative care provider.  Advocate for the development of palliative care plan that may include avoidance of unnecessary testing and intervention, symptom control, discontinuation of medications, hospice and organ donation.  Counsel as early  as possible those with life-limiting chronic disease about palliative care; consider referral to palliative care provider.  Advocate for the development of palliative care plan.

## 2021-06-26 NOTE — Patient Outreach (Addendum)
Medicaid Managed Care   Nurse Care Manager Note  06/26/2021 Name:  [name] [name] MRN:  [id number] DOB:  02/22/1961  [name] [name] is an 60 y.o. year old female who is a primary patient of [name], MD.  The [facility] Managed Care Coordination team was consulted for assistance with:    Chronic healthcare management needs  [name] was given information about Medicaid Managed Care Coordination team services today. [name] Patient agreed to services and verbal consent obtained.  Engaged with patient by telephone for follow up visit in response to provider referral for case management and/or care coordination services.   Assessments/Interventions:  Review of past medical history, allergies, medications, health status, including review of consultants reports, laboratory and other test data, was performed as part of comprehensive evaluation and provision of chronic care management services.  SDOH (Social Determinants of Health) assessments and interventions performed: SDOH Interventions    Flowsheet Row Most Recent Value  SDOH Interventions   Intimate Partner Violence Interventions Intervention Not Indicated       Care Plan  Allergies  Allergen Reactions   Lisinopril Swelling    Medications Reviewed Today     Reviewed by Gayla Medicus, RN (Registered Nurse) on 06/26/21 at [place] List Status: <None>   Medication Order Taking? Sig Documenting Provider Last Dose Status Informant  albuterol (PROVENTIL) (2.5 MG/3ML) 0.083% nebulizer solution [id number]  Take 3 mLs (2.5 mg total) by nebulization every 4 (four) hours as needed for wheezing or shortness of breath. Henderly, Britni A, PA-C  Active Self  albuterol (VENTOLIN HFA) 108 (90 Base) MCG/ACT inhaler [id number]  Inhale 1 puff into the lungs every 4 (four) hours as needed for wheezing or shortness of breath. [name], MD  Active Self  amLODipine (NORVASC) 10 MG tablet [id number]  Take 1 tablet (10 mg  total) by mouth daily. [name], FNP  Active Self  Artificial Tear Ointment (DRY EYES OP) [id number]  Place 1 drop into both eyes daily as needed (dry eyes). [provider]  Active Self  atorvastatin (LIPITOR) 40 MG tablet [id number]  Take 1 tablet (40 mg total) by mouth every morning. Must have HF appt for further refills [name], FNP  Active Self  blood glucose meter kit and supplies KIT [id number]  Dispense based on patient and insurance preference. Use up to four times daily as directed. (FOR ICD-9 250.00, 250.01). Dustin Flock, MD  Active Self  carvedilol (COREG) 6.25 MG tablet [id number]  Take 1 tablet (6.25 mg total) by mouth 2 (two) times daily with a meal. Kc, Ramesh, MD  Active   ferrous sulfate 325 (65 FE) MG tablet [id number]  Take 325 mg by mouth daily with breakfast.  Patient not taking: Reported on 05/05/2021   [provider]  Active Self  FLOVENT HFA 110 MCG/ACT inhaler [id number]  INHALE TWO PUFFS BY MOUTH INTO LUNGS TWICE DAILY [name], MD  Active   hydrALAZINE (APRESOLINE) 50 MG tablet [id number]  Take 1 tablet (50 mg total) by mouth 3 (three) times daily. [name], MD  Active Self  insulin glargine (LANTUS SOLOSTAR) 100 UNIT/ML Solostar Pen [id number]  Inject 58 Units into the skin daily. [name], MD  Active Self  Misc. Devices MISC [id number] No 2 L of oxygen via nasal cannula.  Diagnosis-chronic respiratory failure with hypoxia  Patient not taking: Reported on 06/26/2021   [name], MD Not Taking Active Self  potassium chloride SA (KLOR-CON) 20 MEQ  tablet [id number]  Take 1 tablet (20 mEq total) by mouth daily. [name], MD  Expired 05/05/21 2359 Self  pregabalin (LYRICA) 75 MG capsule [id number]  TAKE ONE CAPSULE BY MOUTH TWICE DAILY [name], MD  Active   spironolactone (ALDACTONE) 25 MG tablet [id number]  Take 1 tablet (25 mg total) by mouth daily. [name], MD  Active Self  Torsemide 40 MG TABS  [id number]  Take 40 mg by mouth daily for 5 days, THEN 40 mg every other day.  Patient taking differently: Take 14m by mouth every other day   WKatherine Roan MD  Expired 05/18/21 2359 Self  VICTOZA 18 MG/3ML SOPN [id number] INJECT 1.8 MG into THE SKIN ONCE DAILY AS DIRECTED NCharlott Rakes MD  Active             Patient Active Problem List   Diagnosis Date Noted   Chronic diastolic CHF (congestive heart failure) ([place] 05/06/2021   Acute respiratory failure due to COVID-19 (HU[place] 05/06/2021   COVID-19 05/05/2021   CHF exacerbation ([place] 04/02/2021   Chest pain 04/02/2021   Acute-on-chronic kidney injury ([place] 04/02/2021   COPD exacerbation (HF[place] 12/05/2020   COPD with acute exacerbation ([facility] 12/04/2020   CKD stage 3 due to type 2 diabetes mellitus ([facility] 12/04/2020   Anemia of chronic disease 12/04/2020   Class 3 obesity ([facility] 07/27/2020   Acute respiratory disease due to COVID-19 virus 07/25/2020   Acute hypoxemic respiratory failure due to COVID-19 (HBell[place] 07/25/2020   Morbid obesity (HGarner 11/23/2019   Acute respiratory failure with hypoxia (HP[place]court[place] 11/06/2019   Chronic kidney disease, stage 3b (HH[place] 11/06/2019   COPD with acute bronchitis ([place]    Acute kidney failure (H[place] 08/18/2019   Left ventricular hypertrophy 07/21/2019   Educated about COVID-19 virus infection 081/77/1165  Acute diastolic HF (heart failure) (HCollings[place]    Hypoxia    Dyspnea on exertion 05/09/2019   Sleep apnea 05/09/2019   Hyperglycemia 04/09/2019   Snoring 03/13/2019   Symptomatic anemia 08/17/2018   Acute renal failure (ARF) ([facility] 08/17/2018   Chronic cystitis 08/03/2018   Diabetic neuropathy ([facility] 06/16/2017   Non compliance w medication regimen 04/12/2017   Family history of colon cancer in mother 11/17/2016   Dyspareunia in female 11/11/2016   Screen for colon cancer 11/11/2016   History of ovarian cyst 11/11/2016   Chronic hepatitis C without hepatic coma (H[place] 11/09/2016   Hyperlipidemia  10/07/2016   Insulin dependent type 2 diabetes mellitus ([facility] 01/29/2014   Status post intraocular lens implant 11/08/2013   PCO (posterior capsular opacification) 09/28/2013   Pseudophakia 09/12/2013   GERD (gastroesophageal reflux disease) 09/06/2013   Hypertension 09/06/2013   BREAST PAIN, BILATERAL 02/19/2010   ANEMIA, IRON DEFICIENCY 10/03/2009   LEUKOPENIA, MILD 09/19/2009   LIPOMA 06/18/2009   EUSTACHIAN TUBE DYSFUNCTION 06/18/2009   TINEA PEDIS 05/07/2009   SKIN LESION 05/07/2009   COUGH 05/07/2009   SUBSTANCE ABUSE, MULTIPLE 02/22/2007   HCVD (hypertensive cardiovascular disease) 02/22/2007    Conditions to be addressed/monitored per PCP order:   chronic healthcare management needs, DM, OSA, CHF, COPD, HTN, GERD, HLD, CKD, Hepatitis C  Care Plan : General Plan of Care (Adult)  Updates made by CGayla Medicus RN since 06/26/2021 12:00 AM     Problem: Therapeutic Alliance (General Plan of Care)   Priority: Medium  Onset Date: 05/28/2020     Long-Range Goal: Therapeutic Alliance Established   Start Date: 10/29/2020  Expected End Date: 09/24/2021  Recent  Progress: On track  Priority: Medium  Note:   Evidence-based guidance:  Avoid value judgments; convey acceptance.  Encourage collaboration with the treatment team.  Establish rapport; develop trust relationship.  Therapist, art.  Provide emotional support; encourage patient to share feelings of anger, fear and anxiety.  Promote self-reliance and autonomy based on age and ability; discourage overprotection.  Use empathy and nonjudgmental, participatory manner.     Problem: Quality of Life (General Plan of Care)   Priority: High  Onset Date: 10/29/2020     Long-Range Goal: Quality of Life Maintained   Start Date: 10/29/2020  Expected End Date: 09/24/2021  Recent Progress: Not on track  Priority: High  Note:    CARE PLAN ENTRY Medicaid Managed Care (see longitudinal plan of care for additional care plan  information)  Current Barriers:  Patient recently hospitalized 05/05/21-05/07/21 with respiratory failure.  Patient states that she has SOB with activity-  C/O leg selling today after having salt.  Missed Cardiology appointment today and rescheduled.  Needs dental resources as she broke her front tooth while eating.  Nurse Case Manager Clinical Goal(s):  Over the next 30 days, patient will verbalize understanding of plan for decreased swelling.  Over the next 30 days, patient will work with provider to address needs related to swelling Over the next 30 days, patient will continue to work with Pharmacist. Over the next 30 days, patient will attend all scheduled appointments. Over the next 30 days, patient will receive dental resources.  Interventions:  Inter-disciplinary care team collaboration (see longitudinal plan of care) Reviewed medications with patient. Discussed plans with patient for ongoing care management follow up and provided patient with direct contact information for care management team. Collaboration with Pharmacist for review of medications. Pharmacy referral for medication review. Collaborated with Care Guide for dental resources. Care Guide Referral for dental resources.  Plan:  Patient will follow up with provider and fill all prescriptions RNCM will follow up with patient within 30 days.  Evidence-based guidance:  Assess patient's thoughts about quality of life, goals and expectations, and dissatisfaction or desire to improve.  Identify issues of primary importance such as mental health, illness, exercise tolerance, pain, sexual function and intimacy, cognitive change, social isolation, finances and relationships.  Assess and monitor for signs/symptoms of psychosocial concerns, especially depression or ideations regarding harm to others or self; provide or refer for mental health services as needed.  Identify sensory issues that impact quality of life such as hearing  loss, vision deficit; strategize ways to maintain or improve hearing, vision.  Promote access to services in the community to support independence such as support groups, home visiting programs, financial assistance, handicapped parking tags, durable medical equipment and emergency responder.  Promote activities to decrease social isolation such as group support or social, leisure and recreational activities, employment, use of social media; consider safety concerns about being out of home for activities.  Provide patient an opportunity to share by storytelling or a "life review" to give positive meaning to life and to assist with coping and negative experiences.  Encourage patient to tap into hope to improve sense of self.  Counsel based on prognosis and as early as possible about end-of-life and palliative care; consider referral to palliative care provider.  Advocate for the development of palliative care plan that may include avoidance of unnecessary testing and intervention, symptom control, discontinuation of medications, hospice and organ donation.  Counsel as early as possible those with life-limiting chronic disease about palliative care; consider  referral to palliative care provider.  Advocate for the development of palliative care plan.     Follow Up:  Patient agrees to Care Plan and Follow-up.  Plan: The Managed Medicaid care management team will reach out to the patient again over the next 30 days. and The  Patient has been provided with contact information for the Managed Medicaid care management team and has been advised to call with any health related questions or concerns.  Date/time of next scheduled RN care management/care coordination outreach:  07/27/21 at 330.

## 2021-07-01 ENCOUNTER — Ambulatory Visit: Payer: Medicare Other | Admitting: Family Medicine

## 2021-07-03 ENCOUNTER — Encounter: Payer: Self-pay | Admitting: Cardiology

## 2021-07-03 ENCOUNTER — Other Ambulatory Visit: Payer: Self-pay

## 2021-07-03 ENCOUNTER — Ambulatory Visit (INDEPENDENT_AMBULATORY_CARE_PROVIDER_SITE_OTHER): Payer: Medicare Other | Admitting: Cardiology

## 2021-07-03 VITALS — BP 154/80 | HR 90 | Ht 64.0 in | Wt 245.0 lb

## 2021-07-03 DIAGNOSIS — I10 Essential (primary) hypertension: Secondary | ICD-10-CM | POA: Diagnosis not present

## 2021-07-03 DIAGNOSIS — I5032 Chronic diastolic (congestive) heart failure: Secondary | ICD-10-CM | POA: Diagnosis not present

## 2021-07-03 DIAGNOSIS — E1149 Type 2 diabetes mellitus with other diabetic neurological complication: Secondary | ICD-10-CM | POA: Diagnosis not present

## 2021-07-03 DIAGNOSIS — R0602 Shortness of breath: Secondary | ICD-10-CM | POA: Diagnosis not present

## 2021-07-03 DIAGNOSIS — N184 Chronic kidney disease, stage 4 (severe): Secondary | ICD-10-CM

## 2021-07-03 DIAGNOSIS — Z794 Long term (current) use of insulin: Secondary | ICD-10-CM | POA: Diagnosis not present

## 2021-07-03 DIAGNOSIS — G4730 Sleep apnea, unspecified: Secondary | ICD-10-CM

## 2021-07-03 MED ORDER — CARVEDILOL 12.5 MG PO TABS: 12.5000 mg | ORAL_TABLET | Freq: Two times a day (BID) | ORAL | 3 refills | Status: DC

## 2021-07-03 NOTE — Patient Instructions (Signed)
Medication Instructions:  Your Physician recommend you continue on your current medication as directed.    Increase carvedilol to 12.5 mg twice a day.  *If you need a refill on your cardiac medications before your next appointment, please call your pharmacy*  Lab Work: Today (BMET, BNP, and A1C) If you have labs (blood work) drawn today and your tests are completely normal, you will receive your results only by: [place] (if you have MyChart) OR A paper copy in the mail If you have any lab test that is abnormal or we need to change your treatment, we will call you to review the results.  Follow-Up: At [facility], you and your health needs are our priority.  As part of our continuing mission to provide you with exceptional heart care, we have created designated Provider Care Teams.  These Care Teams include your primary Cardiologist (physician) and Advanced Practice Providers (APPs -  Physician Assistants and Nurse Practitioners) who all work together to provide you with the care you need, when you need it.  We recommend signing up for the patient portal called "MyChart".  Sign up information is provided on this After Visit Summary.  MyChart is used to connect with patients for Virtual Visits (Telemedicine).  Patients are able to view lab/test results, encounter notes, upcoming appointments, etc.  Non-urgent messages can be sent to your provider as well.   To learn more about what you can do with MyChart, go to [email or web address].    Your next appointment:   6 month(s)  The format for your next appointment:   In Person  Provider:   APP

## 2021-07-03 NOTE — Progress Notes (Addendum)
Cardiology Office Note   Date:  07/03/2021   ID:  [name] [name], DOB 1960-09-27, MRN [id number]  PCP:  [name], MD  Cardiologist:   Minus Breeding, MD Referring:  [name], MD   Chief Complaint  Patient presents with   Shortness of Breath   Edema      History of Present Illness: [name] [name] is a 60 y.o. female who is referred for evaluation of SOB.    Since I last saw her she was in the hospital with Covid.  I reviewed these records for this visit. She had respiratory failure and she was thought to have an element of diastolic HF.  She has CKD IIIa.  Echocardiogram in September demonstrated an EF of 65 to 70%.  Since going home she has done okay.  She had acute shortness of breath that indicated her COVID.  She had cough.  She is no longer having this.  She gets around in her two-story home although I get the sense she is not overly active.  She denies any PND or orthopnea.  She has had some chronic lower extremity swelling.  She denies any chest pressure, neck or arm discomfort.  She had no weight gain.  She is actually down from her peak..  She is actually down from her peak.  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 75 mL 0   albuterol (VENTOLIN HFA) 108 (90 Base) MCG/ACT inhaler Inhale 1 puff into the lungs every 4 (four) hours as needed for wheezing or shortness of breath. 18 g 0   amLODipine (NORVASC) 10 MG tablet Take 1 tablet (10 mg total) by mouth daily. 90 tablet 3   Artificial Tear Ointment (DRY EYES OP) Place 1 drop into both eyes daily as needed (dry eyes).     atorvastatin (LIPITOR) 40 MG tablet Take 1 tablet (40 mg total) by mouth every morning. Must have HF appt for further refills 90 tablet 0   blood glucose meter kit and supplies KIT Dispense based on patient and insurance preference. Use up to four times daily as directed. (FOR ICD-9 250.00, 250.01). 1 each 2   carvedilol (COREG) 12.5 MG tablet Take 1 tablet (12.5 mg total) by mouth 2 (two) times daily. 180 tablet 3   ferrous sulfate 325 (65 FE) MG tablet Take 325 mg by mouth daily with breakfast.     FLOVENT HFA 110 MCG/ACT inhaler INHALE TWO PUFFS BY MOUTH INTO LUNGS TWICE DAILY 12 g 3   hydrALAZINE (APRESOLINE) 50 MG tablet Take 1 tablet (50 mg total) by mouth 3 (three) times daily. 180 tablet 1  insulin glargine (LANTUS SOLOSTAR) 100 UNIT/ML Solostar Pen Inject 58 Units into the skin daily. 30 mL 6   Misc. Devices MISC 2 L of oxygen via nasal cannula.  Diagnosis-chronic respiratory failure with hypoxia 1 each 0   pregabalin (LYRICA) 75 MG capsule TAKE ONE CAPSULE BY MOUTH TWICE DAILY 60 capsule 1   spironolactone (ALDACTONE) 25 MG tablet Take 1 tablet (25 mg total) by mouth daily. 90 tablet 1   VICTOZA 18 MG/3ML SOPN INJECT 1.8 MG into THE SKIN ONCE DAILY AS DIRECTED 9 mL 0   potassium chloride SA (KLOR-CON) 20 MEQ tablet Take 1 tablet (20 mEq total) by mouth daily. 30 tablet 0   Torsemide 40 MG TABS Take 40 mg by mouth daily for 5 days, THEN 40 mg every other day. (Patient taking differently: Take 40m by mouth every other day) 45 tablet 6   No current facility-administered medications for this visit.    Allergies:   Lisinopril     ROS:  Please see the history of present illness.   Otherwise, review of systems are positive for none.   All other systems are reviewed and negative.    PHYSICAL EXAM: VS:  BP (!) 154/80 (BP Location: Left Arm, Patient Position: Sitting, Cuff Size: Large)    Pulse 90    Ht '5\' 4"'  (1.626 m)    Wt 245 lb (111.1 kg)    LMP 04/01/2010    BMI 42.05 kg/m  , BMI Body mass index is 42.05 kg/m. GENERAL:  Well appearing NECK:  No jugular venous distention, waveform within normal limits, carotid upstroke brisk and symmetric, no bruits, no thyromegaly LUNGS:  Clear to auscultation bilaterally CHEST:  Unremarkable HEART:  PMI not displaced or sustained,S1 and S2 within normal limits, no S3, no S4, no clicks, no rubs, soft systolic murmur heard best at the left upper sternal border, no diastolic murmurs ABD:  Flat, positive bowel sounds normal in frequency in pitch, no bruits, no rebound, no guarding, no midline pulsatile mass, no hepatomegaly, no splenomegaly EXT:  2 plus pulses throughout, mild leg edema, no cyanosis no clubbing  EKG:  EKG is not ordered today.   Recent Labs: 04/02/2021: Magnesium 1.8 05/05/2021: B Natriuretic Peptide 43.2 05/07/2021: ALT 10; BUN 57; Creatinine, Ser 1.68; Hemoglobin 9.9; Platelets 179; Potassium 4.6; Sodium 138    Lipid Panel    Component Value Date/Time   CHOL 147 01/29/2019 1118   TRIG 102 01/29/2019 1118   HDL 59 01/29/2019 1118   CHOLHDL 2.5 01/29/2019 1118   CHOLHDL 3 06/18/2014 0951   VLDL 19.6 06/18/2014 0951   LDLCALC 68 01/29/2019 1118      Wt Readings from Last 3 Encounters:  07/03/21 245 lb (111.1 kg)  05/05/21 238 lb 12.1 oz (108.3 kg)  04/21/21 251 lb (113.9 kg)      Other studies Reviewed: Additional studies/ records that were reviewed today include: Hospital records Review of the above records demonstrates: See elsewhere   ASSESSMENT AND PLAN:   CHRONIC DIASTOLIC HF: Today she seems to be relatively euvolemic.  I will check  a basic metabolic profile.  She will remain on the meds as listed.  DM:    I will check an A1c.  Otherwise no change in therapy  HTN:     BP is elevated.  I am going to increase her carvedilol to 12.5 mg twice daily.  This is  DYSPNEA:   This is multifactorial.  Again I think she is euvolemic.  I  will check a BNP level.  OBESITY:   We have talked extensively about weight loss.  SLEEP APNEA:   I had her sleep apnea nurse talk to her again.  The patient has daytime somnolence.  She has an elevated Epworth sleepiness score.  She has fatigue.  She has snoring.  Because it has been greater than 2 years she will need another in lab sleep study.  CKD:    I will check a creatinine.  Last was 1.68.   Current medicines are reviewed at length with the patient today.  The patient does not have concerns regarding medicines.  The following changes have been made:  None  Labs/ tests ordered today include: See above  No orders of the defined types were placed in this encounter.    Disposition:   FU with APP in six months    Signed, Minus Breeding, MD  07/03/2021 3:05 PM    [facility]

## 2021-07-04 LAB — BASIC METABOLIC PANEL
BUN/Creatinine Ratio: 15 (ref 12–28)
BUN: 21 mg/dL (ref 8–27)
CO2: 19 mmol/L — ABNORMAL LOW (ref 20–29)
Calcium: 9.4 mg/dL (ref 8.7–10.3)
Chloride: 112 mmol/L — ABNORMAL HIGH (ref 96–106)
Creatinine, Ser: 1.37 mg/dL — ABNORMAL HIGH (ref 0.57–1.00)
Glucose: 72 mg/dL (ref 70–99)
Potassium: 4.2 mmol/L (ref 3.5–5.2)
Sodium: 144 mmol/L (ref 134–144)
eGFR: 44 mL/min/{1.73_m2} — ABNORMAL LOW (ref >59)

## 2021-07-04 LAB — HEMOGLOBIN A1C
Est. average glucose Bld gHb Est-mCnc: 200 mg/dL
Hgb A1c MFr Bld: 8.6 % — ABNORMAL HIGH (ref 4.8–5.6)

## 2021-07-04 LAB — BRAIN NATRIURETIC PEPTIDE: BNP: 10.3 pg/mL (ref 0.0–100.0)

## 2021-07-08 ENCOUNTER — Telehealth: Payer: Self-pay | Admitting: *Deleted

## 2021-07-08 NOTE — Telephone Encounter (Signed)
Prior Authorization for split night sleep study sent to [facility] via web portal. Tracking Number [id number].

## 2021-07-09 ENCOUNTER — Other Ambulatory Visit: Payer: Self-pay | Admitting: Family Medicine

## 2021-07-09 ENCOUNTER — Encounter: Payer: Self-pay | Admitting: *Deleted

## 2021-07-09 ENCOUNTER — Other Ambulatory Visit: Payer: Self-pay | Admitting: Internal Medicine

## 2021-07-09 DIAGNOSIS — Z794 Long term (current) use of insulin: Secondary | ICD-10-CM

## 2021-07-09 DIAGNOSIS — E1159 Type 2 diabetes mellitus with other circulatory complications: Secondary | ICD-10-CM

## 2021-07-09 NOTE — Telephone Encounter (Signed)
Attempted again to contact the patient  # [phone], about her request for a wheelchair, message left with call back requested to this CM.

## 2021-07-09 NOTE — Telephone Encounter (Signed)
Not delegated.

## 2021-07-09 NOTE — Telephone Encounter (Signed)
Requested Prescriptions  Pending Prescriptions Disp Refills   hydrALAZINE (APRESOLINE) 50 MG tablet [Pharmacy Med Name: hydralazine 50 mg tablet] 180 tablet 1    Sig: TAKE ONE TABLET BY MOUTH THREE TIMES DAILY     Cardiovascular:  Vasodilators Failed - 07/09/2021  8:02 AM      Failed - HCT in normal range and within 360 days    HCT  Date Value Ref Range Status  05/07/2021 31.5 (L) 36.0 - 46.0 % Final   Hematocrit  Date Value Ref Range Status  07/10/2019 30.9 (L) 34.0 - 46.6 % Final         Failed - HGB in normal range and within 360 days    Hemoglobin  Date Value Ref Range Status  05/07/2021 9.9 (L) 12.0 - 15.0 g/dL Final  07/10/2019 9.8 (L) 11.1 - 15.9 g/dL Final         Failed - RBC in normal range and within 360 days    RBC  Date Value Ref Range Status  05/07/2021 3.62 (L) 3.87 - 5.11 MIL/uL Final         Failed - Last BP in normal range    BP Readings from Last 1 Encounters:  07/03/21 (!) 154/80         Passed - WBC in normal range and within 360 days    WBC  Date Value Ref Range Status  05/07/2021 4.8 4.0 - 10.5 K/uL Final         Passed - PLT in normal range and within 360 days    Platelets  Date Value Ref Range Status  05/07/2021 179 150 - 400 K/uL Final  07/10/2019 171 150 - 450 x10E3/uL Final         Passed - Valid encounter within last 12 months    Recent Outpatient Visits          2 months ago Chronic respiratory failure with hypoxia ([place])   [place], Charlane Ferretti, MD   3 months ago Encounter for [employer] annual wellness exam   [place], [place], MD   4 months ago Type 2 diabetes mellitus with other neurologic complication, with long-term current use of insulin ([place])   [place], [place], MD   1 year ago Type 2 diabetes mellitus with stage 3b chronic kidney disease, with long-term current use of insulin ([place])   [place], [place], MD   1 year ago Type 2 diabetes mellitus with stage 3b chronic kidney disease, with long-term current use of insulin ([place])   [place], [place], MD      Future Appointments            In 5 months [place], Jossie Ng, NP [facility], [facility]

## 2021-07-10 NOTE — Telephone Encounter (Signed)
Received auth for split night sleep study from [facility]. Auth # [id number]. Valid dates 07/08/21 to 08/18/21.

## 2021-07-16 ENCOUNTER — Other Ambulatory Visit: Payer: Self-pay

## 2021-07-16 NOTE — Patient Instructions (Signed)
Visit Information  [name] was given information about Medicaid Managed Care team care coordination services as a part of their [place] Medicaid benefit. [name] verbally consented to engagement with the [facility] Managed Care team.   If you are experiencing a medical emergency, please call 911 or report to your local emergency department or urgent care.   If you have a non-emergency medical problem during routine business hours, please contact your provider's office and ask to speak with a nurse.   For questions related to your [facility], please call: [phone] or visit the homepage here: [email or web address]  If you would like to schedule transportation through your [facility], please call the following number at least 2 days in advance of your appointment: [phone].   Call the [place] at [phone], at any time, 24 hours a day, 7 days a week. If you are in danger or need immediate medical attention call 911.  If you would like help to quit smoking, call 1-800-QUIT-NOW 253-572-8099) OR Espaol: 1-855-Djelo-Ya (8-177-116-5790) o para ms informacin haga clic aqu or Text READY to 200-400 to register via text  [name] [name] - following are the goals we discussed in your visit today:   Goals Addressed   None     Social Worker will follow up with patient in  14 days.   [name], BSW, [place] Managed Medicaid Team  [phone]   Following is a copy of your plan of care:  There are no care plans that you recently modified to display for this patient.

## 2021-07-16 NOTE — Patient Outreach (Signed)
Medicaid Managed Care Social Work Note  07/16/2021 Name:  [name] [name] MRN:  [id number] DOB:  07/08/1961  [name] [name] is an 61 y.o. year old female who is a primary patient of [name], MD.  The [facility] Managed Care Coordination team was consulted for assistance with:   dental resources and breast reduction information  [name] [name] was given information about Medicaid Managed Care Coordination team services today. [name] [name] Patient agreed to services and verbal consent obtained.  Engaged with patient  for by telephone forinitial visit in response to referral for case management and/or care coordination services.   Assessments/Interventions:  Review of past medical history, allergies, medications, health status, including review of consultants reports, laboratory and other test data, was performed as part of comprehensive evaluation and provision of chronic care management services.  SDOH: (Social Determinant of Health) assessments and interventions performed: BSW receieved a referral for patient to get assistance with finding a doctor that will accpet her medicaid. Patient stated she would like for the list to be mailed to her. Patient stated she would like information on getting a breast reduction and if her insurance would cover it. BSW will complete research to see If it is covered. BSW completed research and a breast reduction is approved as long as it is deemed medically necessary. BSW will send patient PCP a message letting her know this is something the patient is interested in.   Advanced Directives Status:  Not addressed in this encounter.  Care Plan                 Allergies  Allergen Reactions   Lisinopril Swelling    Medications Reviewed Today     Reviewed by Minus Breeding, MD (Physician) on 07/03/21 at [place] List Status: <None>   Medication Order Taking? Sig Documenting Provider Last Dose Status Informant  albuterol (PROVENTIL) (2.5 MG/3ML)  0.083% nebulizer solution [id number] Yes Take 3 mLs (2.5 mg total) by nebulization every 4 (four) hours as needed for wheezing or shortness of breath. Henderly, Britni A, PA-C Taking Active Self  albuterol (VENTOLIN HFA) 108 (90 Base) MCG/ACT inhaler [id number] Yes Inhale 1 puff into the lungs every 4 (four) hours as needed for wheezing or shortness of breath. [name], MD Taking Active Self  amLODipine (NORVASC) 10 MG tablet [id number] Yes Take 1 tablet (10 mg total) by mouth daily. [name], FNP Taking Active Self  Artificial Tear Ointment (DRY EYES OP) [id number] Yes Place 1 drop into both eyes daily as needed (dry eyes). [provider] Taking Active Self  atorvastatin (LIPITOR) 40 MG tablet [id number] Yes Take 1 tablet (40 mg total) by mouth every morning. Must have HF appt for further refills [name], FNP Taking Active Self  blood glucose meter kit and supplies KIT [id number] Yes Dispense based on patient and insurance preference. Use up to four times daily as directed. (FOR ICD-9 250.00, 250.01). Dustin Flock, MD Taking Active Self  carvedilol (COREG) 12.5 MG tablet [id number] Yes Take 1 tablet (12.5 mg total) by mouth 2 (two) times daily. Minus Breeding, MD  Active   ferrous sulfate 325 (65 FE) MG tablet [id number] Yes Take 325 mg by mouth daily with breakfast. [provider] Taking Active Self  FLOVENT HFA 110 MCG/ACT inhaler [id number] Yes INHALE TWO PUFFS BY MOUTH INTO LUNGS TWICE DAILY [name], MD Taking Active   hydrALAZINE (APRESOLINE) 50 MG tablet [id number] Yes Take 1 tablet (50 mg total) by mouth  3 (three) times daily. [name], MD Taking Active Self  insulin glargine (LANTUS SOLOSTAR) 100 UNIT/ML Solostar Pen [id number] Yes Inject 58 Units into the skin daily. [name], MD Taking Active Self  Misc. Devices MISC [id number] Yes 2 L of oxygen via nasal cannula.  Diagnosis-chronic respiratory failure with hypoxia [name], MD  Taking Active Self  potassium chloride SA (KLOR-CON) 20 MEQ tablet [id number]  Take 1 tablet (20 mEq total) by mouth daily. [name], MD  Expired 05/05/21 2359 Self  pregabalin (LYRICA) 75 MG capsule [id number] Yes TAKE ONE CAPSULE BY MOUTH TWICE DAILY [name], MD Taking Active   spironolactone (ALDACTONE) 25 MG tablet [id number] Yes Take 1 tablet (25 mg total) by mouth daily. [name], MD Taking Active Self  Torsemide 40 MG TABS [id number]  Take 40 mg by mouth daily for 5 days, THEN 40 mg every other day.  Patient taking differently: Take 20m by mouth every other day   WKatherine Roan MD  Expired 05/18/21 2359 Self  VICTOZA 18 MG/3ML SOPN [id number] INJECT 1.8 MG into THE SKIN ONCE DAILY AS DIRECTED NCharlott Rakes MD Taking Active             Patient Active Problem List   Diagnosis Date Noted   CKD (chronic kidney disease) stage 4, GFR 15-29 ml/min (HCC) 07/03/2021   Chronic diastolic CHF (congestive heart failure) ([facility] 05/06/2021   Acute respiratory failure due to COVID-19 ([facility] 05/06/2021   COVID-19 05/05/2021   CHF exacerbation ([facility] 04/02/2021   Chest pain 04/02/2021   Acute-on-chronic kidney injury ([facility] 04/02/2021   COPD exacerbation ([facility] 12/05/2020   COPD with acute exacerbation ([facility] 12/04/2020   CKD stage 3 due to type 2 diabetes mellitus ([facility] 12/04/2020   Anemia of chronic disease 12/04/2020   Class 3 obesity ([facility] 07/27/2020   Acute respiratory disease due to COVID-19 virus 07/25/2020   Acute hypoxemic respiratory failure due to COVID-19 ([facility] 07/25/2020   Morbid obesity ([facility] 11/23/2019   Acute respiratory failure with hypoxia ([facility] 11/06/2019   Chronic kidney disease, stage 3b ([facility] 11/06/2019   COPD with acute bronchitis ([facility]    Acute kidney failure ([facility] 08/18/2019   Left ventricular hypertrophy 07/21/2019   Educated about COVID-19 virus infection 076/81/1572  Acute diastolic HF (heart failure) ([facility]    Hypoxia    Dyspnea on  exertion 05/09/2019   Sleep apnea 05/09/2019   Hyperglycemia 04/09/2019   Snoring 03/13/2019   Symptomatic anemia 08/17/2018   Acute renal failure (ARF) ([facility] 08/17/2018   Chronic cystitis 08/03/2018   Diabetic neuropathy ([facility] 06/16/2017   Non compliance w medication regimen 04/12/2017   Family history of colon cancer in mother 11/17/2016   Dyspareunia in female 11/11/2016   Screen for colon cancer 11/11/2016   History of ovarian cyst 11/11/2016   Chronic hepatitis C without hepatic coma ([facility] 11/09/2016   Hyperlipidemia 10/07/2016   Insulin dependent type 2 diabetes mellitus ([facility] 01/29/2014   Status post intraocular lens implant 11/08/2013   PCO (posterior capsular opacification) 09/28/2013   Pseudophakia 09/12/2013   GERD (gastroesophageal reflux disease) 09/06/2013   Hypertension 09/06/2013   BREAST PAIN, BILATERAL 02/19/2010   ANEMIA, IRON DEFICIENCY 10/03/2009   LEUKOPENIA, MILD 09/19/2009   LIPOMA 06/18/2009   EUSTACHIAN TUBE DYSFUNCTION 06/18/2009   TINEA PEDIS 05/07/2009   SKIN LESION 05/07/2009   COUGH 05/07/2009   SUBSTANCE ABUSE, MULTIPLE 02/22/2007   HCVD (hypertensive cardiovascular disease) 02/22/2007    Conditions to be addressed/monitored per  PCP order:   dental and breast reduction  There are no care plans that you recently modified to display for this patient.   Follow up:  Patient agrees to Care Plan and Follow-up.  Plan: The Managed Medicaid care management team will reach out to the patient again over the next 14 days.  Date/time of next scheduled Social Work care management/care coordination outreach: 08/05/20  [name], Arita Miss, [place] Medicaid Team  [phone]

## 2021-07-20 ENCOUNTER — Encounter (HOSPITAL_COMMUNITY): Payer: Self-pay

## 2021-07-21 ENCOUNTER — Other Ambulatory Visit: Payer: Self-pay | Admitting: Internal Medicine

## 2021-07-21 DIAGNOSIS — E1149 Type 2 diabetes mellitus with other diabetic neurological complication: Secondary | ICD-10-CM

## 2021-07-24 ENCOUNTER — Ambulatory Visit: Payer: Self-pay

## 2021-07-27 ENCOUNTER — Other Ambulatory Visit: Payer: Self-pay

## 2021-07-27 ENCOUNTER — Other Ambulatory Visit: Payer: Self-pay | Admitting: Obstetrics and Gynecology

## 2021-07-27 NOTE — Patient Instructions (Signed)
Hi Ms. Oshiro, thank you for speaking to [facility] a great rest of the day!!  Ms. Grandmaison was given information about Medicaid Managed Care team care coordination services as a part of their [place] Medicaid benefit. YANI COVENTRY verbally consented to engagement with the [facility] Managed Care team.   If you are experiencing a medical emergency, please call 911 or report to your local emergency department or urgent care.   If you have a non-emergency medical problem during routine business hours, please contact your provider's office and ask to speak with a nurse.   For questions related to your [facility], please call: [phone] or visit the homepage here: [email or web address]  If you would like to schedule transportation through your [facility], please call the following number at least 2 days in advance of your appointment: [phone].   Call the [place] at [phone], at any time, 24 hours a day, 7 days a week. If you are in danger or need immediate medical attention call 911.  If you would like help to quit smoking, call 1-800-QUIT-NOW (443)598-3374) OR Espaol: 1-855-Djelo-Ya (9-702-637-8588) o para ms informacin haga clic aqu or Text READY to 200-400 to register via text  [name] - following are the goals we discussed in your visit today:   Goals Addressed             This Visit's Progress    Protect My Health       Timeframe:  Long-Range Goal Priority:  Medium Start Date:          10/29/20                   Expected End Date: ongoing             Follow Up Date: 08/27/21   - schedule appointment for flu shot - schedule appointment for vaccines needed due to my age or health - schedule recommended health tests (blood work, mammogram, colonoscopy, pap test) - schedule and keep appointment for annual check-up    07/27/21:  Patient to have sleep study 08/18/21.  Needs dental resources and resources for breast reduction  Why is this important?   Screening tests can find diseases early when they are easier to treat.  Your doctor or nurse will talk with you about which tests are important for you.  Getting shots for common diseases like the flu and shingles will help prevent them.       The patient verbalized understanding of instructions provided today and declined a print copy of patient instruction materials.   The Managed Medicaid care management team will reach out to the patient again over the next 30 days.  The  Patient  has been provided with contact information for the Managed Medicaid care management team and has been advised to call with any health related questions or concerns.   [name] RN, BSN [place]   [facility] [employer] - Managed Medicaid High Risk [phone].   Following is a copy of your plan of care:  Care Plan : General Plan of Care (Adult)  Updates made by Gayla Medicus, RN since 07/27/2021 12:00 AM     Problem: Quality of Life (General Plan of Care)   Priority: High  Onset Date: 10/29/2020     Long-Range Goal: Quality of Life Maintained   Start Date: 10/29/2020  Expected End Date: 09/24/2021  Recent Progress: Not  on track  Priority: High  Note:    CARE PLAN ENTRY Medicaid Managed Care (see longitudinal plan of care for additional care plan information)  Current Barriers:  Patient without complaint today.  Would like resources for breast reduction surgery and  needs dental resources as she broke her front tooth while eating.  Nurse Case Manager Clinical Goal(s):  Over the next 30 days, patient will verbalize understanding of plan for decreased swelling.  Over the next 30 days, patient will work with provider to address needs related to swelling Over the next 30 days, patient will continue to work with Pharmacist. Over the  next 30 days, patient will attend all scheduled appointments. Over the next 30 days, patient will receive requested resources.  Interventions:  Inter-disciplinary care team collaboration (see longitudinal plan of care) Reviewed medications with patient. Discussed plans with patient for ongoing care management follow up and provided patient with direct contact information for care management team. Collaboration with Pharmacist for review of medications. Pharmacy referral for medication review. Collaborated with BSW for resources. BSW  Referral for resources.  Plan:  Patient will follow up with provider and fill all prescriptions RNCM will follow up with patient within 30 days.    Evidence-based guidance:  Assess patient's thoughts about quality of life, goals and expectations, and dissatisfaction or desire to improve.  Identify issues of primary importance such as mental health, illness, exercise tolerance, pain, sexual function and intimacy, cognitive change, social isolation, finances and relationships.  Assess and monitor for signs/symptoms of psychosocial concerns, especially depression or ideations regarding harm to others or self; provide or refer for mental health services as needed.  Identify sensory issues that impact quality of life such as hearing loss, vision deficit; strategize ways to maintain or improve hearing, vision.  Promote access to services in the community to support independence such as support groups, home visiting programs, financial assistance, handicapped parking tags, durable medical equipment and emergency responder.  Promote activities to decrease social isolation such as group support or social, leisure and recreational activities, employment, use of social media; consider safety concerns about being out of home for activities.  Provide patient an opportunity to share by storytelling or a "life review" to give positive meaning to life and to assist with coping  and negative experiences.  Encourage patient to tap into hope to improve sense of self.  Counsel based on prognosis and as early as possible about end-of-life and palliative care; consider referral to palliative care provider.  Advocate for the development of palliative care plan that may include avoidance of unnecessary testing and intervention, symptom control, discontinuation of medications, hospice and organ donation.  Counsel as early as possible those with life-limiting chronic disease about palliative care; consider referral to palliative care provider.  Advocate for the development of palliative care plan.

## 2021-07-27 NOTE — Patient Outreach (Signed)
Medicaid Managed Care   Nurse Care Manager Note  07/27/2021 Name:  [name] MRN:  [id number] DOB:  August 05, 1960  [name] [name] is an 61 y.o. year old female who is a primary patient of [name] Rakes, MD.  The [facility] Managed Care Coordination team was consulted for assistance with:    Chronic healthcare management needs  [name] [name] was given information about Medicaid Managed Care Coordination team services today. [name] [name] Patient agreed to services and verbal consent obtained.  Engaged with patient by telephone for follow up visit in response to provider referral for case management and/or care coordination services.   Assessments/Interventions:  Review of past medical history, allergies, medications, health status, including review of consultants reports, laboratory and other test data, was performed as part of comprehensive evaluation and provision of chronic care management services.  SDOH (Social Determinants of Health) assessments and interventions performed: SDOH Interventions    Flowsheet Row Most Recent Value  SDOH Interventions   Food Insecurity Interventions Intervention Not Indicated  Housing Interventions Intervention Not Indicated       Care Plan  Allergies  Allergen Reactions   Lisinopril Swelling    Medications Reviewed Today     Reviewed by Gayla Medicus, RN (Registered Nurse) on 07/27/21 at [place] List Status: <None>   Medication Order Taking? Sig Documenting Provider Last Dose Status Informant  albuterol (PROVENTIL) (2.5 MG/3ML) 0.083% nebulizer solution [id number]  Take 3 mLs (2.5 mg total) by nebulization every 4 (four) hours as needed for wheezing or shortness of breath. [name], [name] A, PA-C  Active Self  albuterol (VENTOLIN HFA) 108 (90 Base) MCG/ACT inhaler [id number]  Inhale 1 puff into the lungs every 4 (four) hours as needed for wheezing or shortness of breath. [name] Rakes, MD  Active Self  amLODipine (NORVASC) 10 MG  tablet [id number]  Take 1 tablet (10 mg total) by mouth daily. [name] Graff, FNP  Active Self  Artificial Tear Ointment (DRY EYES OP) [id number]  Place 1 drop into both eyes daily as needed (dry eyes). [provider]  Active Self  atorvastatin (LIPITOR) 40 MG tablet [id number]  Take 1 tablet (40 mg total) by mouth every morning. Must have HF appt for further refills [name] Graff, FNP  Active Self  blood glucose meter kit and supplies KIT [id number]  Dispense based on patient and insurance preference. Use up to four times daily as directed. (FOR ICD-9 250.00, 250.01). Dustin Flock, MD  Active Self  carvedilol (COREG) 12.5 MG tablet [id number]  Take 1 tablet (12.5 mg total) by mouth 2 (two) times daily. Minus Breeding, MD  Active   ferrous sulfate 325 (65 FE) MG tablet [id number]  Take 325 mg by mouth daily with breakfast. [provider]  Active Self  FLOVENT HFA 110 MCG/ACT inhaler [id number]  INHALE TWO PUFFS BY MOUTH INTO LUNGS TWICE DAILY [name] [name], [place], MD  Active   hydrALAZINE (APRESOLINE) 50 MG tablet [id number]  TAKE ONE TABLET BY MOUTH THREE TIMES DAILY [name] Rakes, MD  Active   insulin glargine (LANTUS SOLOSTAR) 100 UNIT/ML Solostar Pen [id number]  Inject 58 Units into the skin daily. [name] Rakes, MD  Active Self  Misc. Devices MISC [id number] No 2 L of oxygen via nasal cannula.  Diagnosis-chronic respiratory failure with hypoxia  Patient not taking: Reported on 07/27/2021   [name] Rakes, MD Not Taking Active Self  potassium chloride SA (KLOR-CON) 20 MEQ tablet [id number]  Take 1 tablet (20 mEq total) by mouth  daily. [name] Pert, MD  Expired 05/05/21 2359 Self  pregabalin (LYRICA) 75 MG capsule [id number]  TAKE ONE CAPSULE BY MOUTH TWICE DAILY [name] Rakes, MD  Active   spironolactone (ALDACTONE) 25 MG tablet [id number]  Take 1 tablet (25 mg total) by mouth daily. [name] Rakes, MD  Active Self  Torsemide 40 MG TABS [id number]  Take 40 mg by mouth daily for 5  days, THEN 40 mg every other day.  Patient taking differently: Take 51m by mouth every other day   [name] Roan MD  Expired 05/18/21 2359 Self  VICTOZA 18 MG/3ML SOPN [id number] INJECT 1.8 MG into THE SKIN ONCE DAILY AS DIRECTED NCharlott Rakes MD  Active             Patient Active Problem List   Diagnosis Date Noted   CKD (chronic kidney disease) stage 4, GFR 15-29 ml/min (HCC) 07/03/2021   Chronic diastolic CHF (congestive heart failure) ([facility] 05/06/2021   Acute respiratory failure due to COVID-19 ([facility] 05/06/2021   COVID-19 05/05/2021   CHF exacerbation ([facility] 04/02/2021   Chest pain 04/02/2021   Acute-on-chronic kidney injury ([facility] 04/02/2021   COPD exacerbation ([facility] 12/05/2020   COPD with acute exacerbation ([facility] 12/04/2020   CKD stage 3 due to type 2 diabetes mellitus ([facility] 12/04/2020   Anemia of chronic disease 12/04/2020   Class 3 obesity ([facility] 07/27/2020   Acute respiratory disease due to COVID-19 virus 07/25/2020   Acute hypoxemic respiratory failure due to COVID-19 ([facility] 07/25/2020   Morbid obesity ([facility] 11/23/2019   Acute respiratory failure with hypoxia ([facility] 11/06/2019   Chronic kidney disease, stage 3b ([facility] 11/06/2019   COPD with acute bronchitis ([facility]    Acute kidney failure ([facility] 08/18/2019   Left ventricular hypertrophy 07/21/2019   Educated about COVID-19 virus infection 009/47/0962  Acute diastolic HF (heart failure) ([facility]    Hypoxia    Dyspnea on exertion 05/09/2019   Sleep apnea 05/09/2019   Hyperglycemia 04/09/2019   Snoring 03/13/2019   Symptomatic anemia 08/17/2018   Acute renal failure (ARF) ([facility] 08/17/2018   Chronic cystitis 08/03/2018   Diabetic neuropathy ([facility] 06/16/2017   Non compliance w medication regimen 04/12/2017   Family history of colon cancer in mother 11/17/2016   Dyspareunia in female 11/11/2016   Screen for colon cancer 11/11/2016   History of ovarian cyst 11/11/2016   Chronic hepatitis C without hepatic coma ([facility]  11/09/2016   Hyperlipidemia 10/07/2016   Insulin dependent type 2 diabetes mellitus (HW[place] 01/29/2014   Status post intraocular lens implant 11/08/2013   PCO (posterior capsular opacification) 09/28/2013   Pseudophakia 09/12/2013   GERD (gastroesophageal reflux disease) 09/06/2013   Hypertension 09/06/2013   BREAST PAIN, BILATERAL 02/19/2010   ANEMIA, IRON DEFICIENCY 10/03/2009   LEUKOPENIA, MILD 09/19/2009   LIPOMA 06/18/2009   EUSTACHIAN TUBE DYSFUNCTION 06/18/2009   TINEA PEDIS 05/07/2009   SKIN LESION 05/07/2009   COUGH 05/07/2009   SUBSTANCE ABUSE, MULTIPLE 02/22/2007   HCVD (hypertensive cardiovascular disease) 02/22/2007    Conditions to be addressed/monitored per PCP order:   chronic healthcare management needs, OSA, CHF, DM, HTN, GERD , HLD, CKD, COPD, HCVD  Care Plan : General Plan of Care (Adult)  Updates made by CGayla Medicus RN since 07/27/2021 12:00 AM     Problem: Quality of Life (General Plan of Care)   Priority: High  Onset Date: 10/29/2020     Long-Range Goal: Quality of Life Maintained   Start Date: 10/29/2020  Expected End Date:  09/24/2021  Recent Progress: Not on track  Priority: High  Note:    CARE PLAN ENTRY Medicaid Managed Care (see longitudinal plan of care for additional care plan information)  Current Barriers:  Patient without complaint today.  Would like resources for breast reduction surgery and  needs dental resources as she broke her front tooth while eating.  Nurse Case Manager Clinical Goal(s):  Over the next 30 days, patient will verbalize understanding of plan for decreased swelling.  Over the next 30 days, patient will work with provider to address needs related to swelling Over the next 30 days, patient will continue to work with Pharmacist. Over the next 30 days, patient will attend all scheduled appointments. Over the next 30 days, patient will receive requested resources.  Interventions:  Inter-disciplinary care team  collaboration (see longitudinal plan of care) Reviewed medications with patient. Discussed plans with patient for ongoing care management follow up and provided patient with direct contact information for care management team. Collaboration with Pharmacist for review of medications. Pharmacy referral for medication review. Collaborated with BSW for resources. BSW  Referral for resources.  Plan:  Patient will follow up with provider and fill all prescriptions RNCM will follow up with patient within 30 days.   Evidence-based guidance:  Assess patient's thoughts about quality of life, goals and expectations, and dissatisfaction or desire to improve.  Identify issues of primary importance such as mental health, illness, exercise tolerance, pain, sexual function and intimacy, cognitive change, social isolation, finances and relationships.  Assess and monitor for signs/symptoms of psychosocial concerns, especially depression or ideations regarding harm to others or self; provide or refer for mental health services as needed.  Identify sensory issues that impact quality of life such as hearing loss, vision deficit; strategize ways to maintain or improve hearing, vision.  Promote access to services in the community to support independence such as support groups, home visiting programs, financial assistance, handicapped parking tags, durable medical equipment and emergency responder.  Promote activities to decrease social isolation such as group support or social, leisure and recreational activities, employment, use of social media; consider safety concerns about being out of home for activities.  Provide patient an opportunity to share by storytelling or a "life review" to give positive meaning to life and to assist with coping and negative experiences.  Encourage patient to tap into hope to improve sense of self.  Counsel based on prognosis and as early as possible about end-of-life and palliative  care; consider referral to palliative care provider.  Advocate for the development of palliative care plan that may include avoidance of unnecessary testing and intervention, symptom control, discontinuation of medications, hospice and organ donation.  Counsel as early as possible those with life-limiting chronic disease about palliative care; consider referral to palliative care provider.  Advocate for the development of palliative care plan.     Follow Up:  Patient agrees to Care Plan and Follow-up.  Plan: The Managed Medicaid care management team will reach out to the patient again over the next 30 days. and The  Patient has been provided with contact information for the Managed Medicaid care management team and has been advised to call with any health related questions or concerns.  Date/time of next scheduled RN care management/care coordination outreach:  08/27/21 at 1030

## 2021-08-05 ENCOUNTER — Other Ambulatory Visit: Payer: Self-pay

## 2021-08-05 NOTE — Patient Outreach (Signed)
Care Coordination  08/05/2021  [name] [name] July 16, 1961 [id number]   Medicaid Managed Care   Unsuccessful Outreach Note  08/05/2021 Name: [name] [name] MRN: [id number] DOB: 12/26/1960  Referred by: [name], MD Reason for referral : High Risk Managed Medicaid (MM Social Work Unsuccessful [employer])   An unsuccessful telephone outreach was attempted today. The patient was referred to the case management team for assistance with care management and care coordination.   Follow Up Plan: The care management team will reach out to the patient again over the next 30 days.   Marland Kitchenajs

## 2021-08-05 NOTE — Patient Instructions (Signed)
Visit Information  [name]  - as a part of your Medicaid benefit, you are eligible for care management and care coordination services at no cost or copay. I was unable to reach you by phone today but would be happy to help you with your health related needs. Please feel free to call me @ [phone]   A member of the Managed Medicaid care management team will reach out to you again over the next 30 days.   [name], BSW, [place] Managed Medicaid Team  [phone]

## 2021-08-06 ENCOUNTER — Ambulatory Visit: Payer: Medicare Other | Admitting: Cardiology

## 2021-08-11 ENCOUNTER — Encounter (HOSPITAL_COMMUNITY): Payer: Self-pay

## 2021-08-12 ENCOUNTER — Ambulatory Visit: Payer: Self-pay | Admitting: *Deleted

## 2021-08-12 NOTE — Telephone Encounter (Signed)
Reason for Disposition  [1] Breast pain AND [2] cause is not known  Red, moist, irritated skin underneath breasts (in women with larger breasts)  Answer Assessment - Initial Assessment Questions 1. SYMPTOM: "What's the main symptom you're concerned about?"  (e.g., lump, pain, rash, nipple discharge)     Knot- R trunk/breast- bottom of the breast at ribs 2. LOCATION: "Where is the pain located?"     Pain under R breast, heavy breast 3. ONSET: "When did pain  start?"     2 weeks 4. PRIOR HISTORY: "Do you have any history of prior problems with your breasts?" (e.g., lumps, cancer, fibrocystic breast disease)     No- just large 5. CAUSE: "What do you think is causing this symptom?"     Large breast- causing discomfort 6. OTHER SYMPTOMS: "Do you have any other symptoms?" (e.g., fever, breast pain, redness or rash, nipple discharge)     Breast pain due to large breast 7. PREGNANCY-BREASTFEEDING: "Is there any chance you are pregnant?" "When was your last menstrual period?" "Are you breastfeeding?"     na  Protocols used: Breast Symptoms-A-AH

## 2021-08-12 NOTE — Telephone Encounter (Signed)
°  Chief Complaint: breast pain, irritation under breast  Symptoms: pain due to large breast, irritated area under breast Frequency: 2 weeks Pertinent Negatives: Patient denies sore under breast Disposition: [] ED /[] Urgent Care (no appt availability in office) / [x] Appointment(In office/virtual)/ []  [place] [facility]/ [] Home Care/ [] Refused Recommended Disposition /[] [place] [facility]/ []  Follow-up with PCP Additional Notes:

## 2021-08-18 ENCOUNTER — Ambulatory Visit (HOSPITAL_BASED_OUTPATIENT_CLINIC_OR_DEPARTMENT_OTHER): Payer: Medicare Other | Attending: Cardiology | Admitting: Cardiovascular Disease

## 2021-08-18 DIAGNOSIS — G4733 Obstructive sleep apnea (adult) (pediatric): Secondary | ICD-10-CM | POA: Diagnosis not present

## 2021-08-18 DIAGNOSIS — G4730 Sleep apnea, unspecified: Secondary | ICD-10-CM | POA: Diagnosis not present

## 2021-08-19 ENCOUNTER — Other Ambulatory Visit: Payer: Self-pay

## 2021-08-21 ENCOUNTER — Ambulatory Visit: Payer: Self-pay

## 2021-08-25 ENCOUNTER — Ambulatory Visit: Payer: Medicare Other | Attending: Family Medicine | Admitting: Family Medicine

## 2021-08-25 VITALS — BP 147/80 | HR 98 | Ht 66.0 in | Wt 237.8 lb

## 2021-08-25 DIAGNOSIS — Z794 Long term (current) use of insulin: Secondary | ICD-10-CM

## 2021-08-25 DIAGNOSIS — R251 Tremor, unspecified: Secondary | ICD-10-CM | POA: Diagnosis not present

## 2021-08-25 DIAGNOSIS — B372 Candidiasis of skin and nail: Secondary | ICD-10-CM | POA: Diagnosis not present

## 2021-08-25 DIAGNOSIS — N644 Mastodynia: Secondary | ICD-10-CM | POA: Diagnosis not present

## 2021-08-25 DIAGNOSIS — E1149 Type 2 diabetes mellitus with other diabetic neurological complication: Secondary | ICD-10-CM

## 2021-08-25 DIAGNOSIS — E1122 Type 2 diabetes mellitus with diabetic chronic kidney disease: Secondary | ICD-10-CM

## 2021-08-25 DIAGNOSIS — N62 Hypertrophy of breast: Secondary | ICD-10-CM

## 2021-08-25 DIAGNOSIS — I129 Hypertensive chronic kidney disease with stage 1 through stage 4 chronic kidney disease, or unspecified chronic kidney disease: Secondary | ICD-10-CM | POA: Diagnosis not present

## 2021-08-25 DIAGNOSIS — I5032 Chronic diastolic (congestive) heart failure: Secondary | ICD-10-CM | POA: Diagnosis not present

## 2021-08-25 DIAGNOSIS — I110 Hypertensive heart disease with heart failure: Secondary | ICD-10-CM

## 2021-08-25 LAB — GLUCOSE, POCT (MANUAL RESULT ENTRY): POC Glucose: 130 mg/dl — AB (ref 70–99)

## 2021-08-25 MED ORDER — NYSTATIN 100000 UNIT/GM EX OINT: 1.0000 "application " | TOPICAL_OINTMENT | Freq: Two times a day (BID) | CUTANEOUS | 2 refills | Status: DC

## 2021-08-25 NOTE — Patient Instructions (Signed)
Intertrigo °Intertrigo is skin irritation (inflammation) that happens in warm, moist areas of the body. The irritation can cause a rash and make skin raw and itchy. The rash is usually pink or red. It happens mostly between folds of skin or where skin rubs together, such as: °Between the toes. °In the armpits. °In the groin area. °Under the belly. °Under the breasts. °Around the butt area. °This condition is not passed from person to person (is not contagious). °What are the causes? °Heat, moisture, rubbing, and not enough air movement. °The condition can be made worse by: °Sweat. °Bacteria. °A fungus, such as yeast. °What increases the risk? °Moisture in your skin folds. °You are more likely to develop this condition if you: °Have diabetes. °Are overweight. °Are not able to move around. °Live in a warm and moist climate. °Wear splints, braces, or other medical devices. °Are not able to control your pee (urine) or poop (stool). °What are the signs or symptoms? °A pink or red skin rash in the skin fold or near the skin fold. °Raw or scaly skin. °Itching. °A burning feeling. °Bleeding. °Leaking fluid. °A bad smell. °How is this treated? °Cleaning and drying your skin. °Taking an antibiotic medicine or using an antibiotic skin cream for a bacterial infection. °Using an antifungal cream on your skin or taking pills for an infection that was caused by a fungus, such as yeast. °Using a steroid ointment to stop the itching and irritation. °Separating the skin fold with a clean cotton cloth to absorb moisture and allow air to flow into the area. °Follow these instructions at home: °Keep the affected area clean and dry. °Do not scratch your skin. °Stay cool as much as you can. Use an air conditioner or a fan, if you have one. °Apply over-the-counter and prescription medicines only as told by your doctor. °If you were prescribed an antibiotic medicine, use it as told by your doctor. Do not stop using the antibiotic even if  your condition starts to get better. °Keep all follow-up visits as told by your doctor. This is important. °How is this prevented? ° °Stay at a healthy weight. °Take care of your feet. This is very important if you have diabetes. You should: °Wear shoes that fit well. °Keep your feet dry. °Wear clean cotton or wool socks. °Protect the skin in your groin and butt area as told by your doctor. To do this: °Follow a regular cleaning routine. °Use creams, powders, or ointments that protect your skin. °Change protection pads often. °Do not wear tight clothes. Wear clothes that: °Are loose. °Take moisture away from your body. °Are made of cotton. °Wear a bra that gives good support, if needed. °Shower and dry yourself well after being active. Use a hair dryer on a cool setting to dry between skin folds. °Keep your blood sugar under control if you have diabetes. °Contact a doctor if: °Your symptoms do not get better with treatment. °Your symptoms get worse or they spread. °You notice more redness and warmth. °You have a fever. °Summary °Intertrigo is skin irritation that occurs when folds of skin rub together. °This condition is caused by heat, moisture, and rubbing. °This condition may be treated by cleaning and drying your skin and with medicines. °Apply over-the-counter and prescription medicines only as told by your doctor. °Keep all follow-up visits as told by your doctor. This is important. °This information is not intended to replace advice given to you by your health care provider. Make sure you discuss   any questions you have with your health care provider. °Document Revised: 04/13/2018 Document Reviewed: 04/13/2018 °Elsevier Patient Education © 2022 [employer] ° °

## 2021-08-25 NOTE — Progress Notes (Signed)
Having pain in right breast for 2 months. Rash under right breast.

## 2021-08-25 NOTE — Progress Notes (Signed)
Subjective:  Patient ID: [name] [name], female    DOB: 05-30-1961  Age: 61 y.o. MRN: [id number]  CC: Breast Pain   HPI [name] [name] is a 61 y.o. year old female with a history of type 2 diabetes mellitus (A1c 8.6), diabetic neuropathy hypertension, diabetic retinopathy, hyperlipidemia, hepatitis C (completed treatment with harvoni), OSA, diastolic CHF (EF 55 to 98%), stage III CKD.  Interval History: She saw her cardiologist in 06/2021 and notes reviewed.  She was noted to be euvolemic even though she appeared dyspneic; carvedilol dose increased due to elevated blood pressure.  She complains of a rash under both breasts and complains of bilateral  breast pain. She would like a referral for reduction as she states a cousin of hers also had large breasts and had similar symptoms. Mammogram from 05/2021 was negative for malignancy.  She has not been checking her sugars at home.  CBG in the clinic is 131.  She is on 60 units of Lantus and continues with Victoza A1c is 8.6 up from 6.6 previously. Comlains of tremors which occur spontaneously.  She is unsure of a family history of tremors.  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   Qualifier: Diagnosis of  By: Jorene Minors, Scott     Hypertension    MRSA (methicillin resistant Staphylococcus aureus)    Trichomonas    VAGINITIS, BACTERIAL, RECURRENT 08/15/2007   Qualifier: Diagnosis of  By: Radene Ou MD, [place]      Past Surgical History:  Procedure Laterality Date   BREAST BIOPSY Left 2018   CESAREAN SECTION     breech    Family History  Problem Relation Age of Onset   Colon cancer Mother    Liver disease Sister    Other Neg Hx    Breast cancer Neg Hx    Esophageal cancer Neg Hx    Rectal cancer Neg Hx     Allergies  Allergen Reactions   Lisinopril Swelling     Outpatient Medications Prior to Visit  Medication Sig Dispense Refill   albuterol (PROVENTIL) (2.5 MG/3ML) 0.083% nebulizer solution Take 3 mLs (2.5 mg total) by nebulization every 4 (four) hours as needed for wheezing or shortness of breath. 75 mL 0   albuterol (VENTOLIN HFA) 108 (90 Base) MCG/ACT inhaler Inhale 1 puff into the lungs every 4 (four) hours as needed for wheezing or shortness of breath. 18 g 0   amLODipine (NORVASC) 10 MG tablet Take 1 tablet (10 mg total) by mouth daily. 90 tablet 3   Artificial Tear Ointment (DRY EYES OP) Place 1 drop into both eyes daily as needed (dry eyes).     atorvastatin (LIPITOR) 40 MG tablet Take 1 tablet (40 mg total) by mouth every morning. Must have HF appt for further refills 90 tablet 0   blood glucose meter kit and supplies KIT Dispense based on patient and insurance preference. Use up to four times daily as directed. (FOR ICD-9 250.00, 250.01). 1 each 2   carvedilol (COREG) 12.5 MG tablet Take 1 tablet (12.5 mg total) by mouth 2 (two) times daily. 180 tablet 3   ferrous sulfate 325 (65 FE) MG tablet Take 325 mg by mouth daily with breakfast.     FLOVENT HFA 110 MCG/ACT inhaler INHALE TWO PUFFS BY MOUTH  INTO LUNGS TWICE DAILY 12 g 3   hydrALAZINE (APRESOLINE) 50 MG tablet TAKE ONE TABLET BY MOUTH THREE TIMES DAILY 270 tablet 1   insulin glargine (LANTUS SOLOSTAR) 100 UNIT/ML Solostar Pen Inject 58 Units into the skin daily. 30 mL 6   Misc. Devices MISC 2 L of oxygen via nasal cannula.  Diagnosis-chronic respiratory failure with hypoxia 1 each 0   pregabalin (LYRICA) 75 MG capsule TAKE ONE CAPSULE BY MOUTH TWICE DAILY 60 capsule 1   spironolactone (ALDACTONE) 25 MG tablet Take 1 tablet (25 mg total) by mouth daily. 90 tablet 1   VICTOZA 18 MG/3ML SOPN INJECT 1.8 MG into THE SKIN ONCE DAILY AS DIRECTED 9 mL 0   potassium chloride SA (KLOR-CON) 20 MEQ tablet Take 1 tablet (20 mEq total) by mouth daily. 30 tablet 0   Torsemide 40 MG TABS Take 40 mg  by mouth daily for 5 days, THEN 40 mg every other day. (Patient taking differently: Take 60m by mouth every other day) 45 tablet 6   No facility-administered medications prior to visit.     ROS Review of Systems  Constitutional:  Negative for activity change, appetite change and fatigue.  HENT:  Negative for congestion, sinus pressure and sore throat.   Eyes:  Negative for visual disturbance.  Respiratory:  Negative for cough, chest tightness, shortness of breath and wheezing.   Cardiovascular:  Negative for chest pain and palpitations.  Gastrointestinal:  Negative for abdominal distention, abdominal pain and constipation.  Endocrine: Negative for polydipsia.  Genitourinary:  Negative for dysuria and frequency.  Musculoskeletal:  Negative for arthralgias and back pain.  Skin:  Positive for rash.  Neurological:  Negative for tremors, light-headedness and numbness.  Hematological:  Does not bruise/bleed easily.  Psychiatric/Behavioral:  Negative for agitation and behavioral problems.    Objective:  BP (!) 147/80    Pulse 98    Ht '5\' 6"'  (1.676 m)    Wt 237 lb 12.8 oz (107.9 kg)    LMP 04/01/2010    SpO2 94%    BMI 38.38 kg/m   BP/Weight 08/25/2021 08/18/2021 109/47/0962 Systolic BP 1836- 1629 Diastolic BP 80 - 80  Wt. (Lbs) 237.8 240 245  BMI 38.38 38.74 42.05  Some encounter information is confidential and restricted. Go to Review Flowsheets activity to see all data.      Physical Exam Constitutional:      Appearance: She is well-developed.  Cardiovascular:     Rate and Rhythm: Normal rate.     Heart sounds: Normal heart sounds. No murmur heard. Pulmonary:     Effort: Pulmonary effort is normal.     Breath sounds: Normal breath sounds. No wheezing or rales.  Chest:     Chest wall: No tenderness.  Breasts:    Right: Skin change (slightly hyperpigmented rash in inframammary region) present. No mass.     Left: Tenderness present. No mass.  Abdominal:     General: Bowel  sounds are normal. There is no distension.     Palpations: Abdomen is soft. There is no mass.     Tenderness: There is no abdominal tenderness.  Musculoskeletal:        General: Normal range of motion.     Right lower leg: No edema.     Left lower leg: No edema.  Neurological:     Mental Status: She is alert and oriented to person, place, and time.     Motor: No weakness.  Gait: Gait normal.     Comments: Intermittent tremors noticed in hands and feet sporadically  Psychiatric:        Mood and Affect: Mood normal.    CMP Latest Ref Rng & Units 07/03/2021 05/07/2021 05/06/2021  Glucose 70 - 99 mg/dL 72 231(H) -  BUN 8 - 27 mg/dL 21 57(H) -  Creatinine 0.57 - 1.00 mg/dL 1.37(H) 1.68(H) 1.71(H)  Sodium 134 - 144 mmol/L 144 138 -  Potassium 3.5 - 5.2 mmol/L 4.2 4.6 -  Chloride 96 - 106 mmol/L 112(H) 104 -  CO2 20 - 29 mmol/L 19(L) 26 -  Calcium 8.7 - 10.3 mg/dL 9.4 8.6(L) -  Total Protein 6.5 - 8.1 g/dL - 7.3 -  Total Bilirubin 0.3 - 1.2 mg/dL - 0.3 -  Alkaline Phos 38 - 126 U/L - 55 -  AST 15 - 41 U/L - 12(L) -  ALT 0 - 44 U/L - 10 -    Lipid Panel     Component Value Date/Time   CHOL 147 01/29/2019 1118   TRIG 102 01/29/2019 1118   HDL 59 01/29/2019 1118   CHOLHDL 2.5 01/29/2019 1118   CHOLHDL 3 06/18/2014 0951   VLDL 19.6 06/18/2014 0951   LDLCALC 68 01/29/2019 1118    CBC    Component Value Date/Time   WBC 4.8 05/07/2021 0353   RBC 3.62 (L) 05/07/2021 0353   HGB 9.9 (L) 05/07/2021 0353   HGB 9.8 (L) 07/10/2019 1544   HCT 31.5 (L) 05/07/2021 0353   HCT 30.9 (L) 07/10/2019 1544   PLT 179 05/07/2021 0353   PLT 171 07/10/2019 1544   MCV 87.0 05/07/2021 0353   MCV 86 07/10/2019 1544   MCH 27.3 05/07/2021 0353   MCHC 31.4 05/07/2021 0353   RDW 13.8 05/07/2021 0353   RDW 12.7 07/10/2019 1544   LYMPHSABS 1.0 05/07/2021 0353   LYMPHSABS 1.3 07/10/2019 1544   MONOABS 0.5 05/07/2021 0353   EOSABS 0.0 05/07/2021 0353   EOSABS 0.0 07/10/2019 1544   BASOSABS 0.0  05/07/2021 0353   BASOSABS 0.0 07/10/2019 1544    Lab Results  Component Value Date   HGBA1C 8.6 (H) 07/03/2021    Assessment & Plan:  1. Type 2 diabetes mellitus with other neurologic complication, with long-term current use of insulin (HCC) Uncontrolled with A1c of 8.6; goal is less than 7.0 Blood sugar log reveals improvement We will make no regimen change today - POCT glucose (manual entry)  2. Hypertensive heart disease with chronic diastolic congestive heart failure (HCC) EF 55 to 60% Euvolemic Continue guideline directed medical therapy  3. 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 ([place]) Slightly above goal No regimen change today Counseled on blood pressure goal of less than 130/80, low-sodium, DASH diet, medication compliance, 150 minutes of moderate intensity exercise per week. Discussed medication compliance, adverse effects.  4. Bilateral mastodynia - MM DIAG BREAST TOMO BILATERAL; Future - US BREAST LTD UNI LEFT INC AXILLA; Future - US BREAST LTD UNI RIGHT INC AXILLA; Future  5. Large breasts She is desirous of a breast reduction surgery as she feels certain that is the cause of her breast pain - Ambulatory referral to Plastic Surgery  6. Intertriginous candidiasis Placed on topical antifungal  7.  Tremors Referred to neurology    Meds ordered this encounter  Medications   nystatin ointment (MYCOSTATIN)    Sig: Apply 1 application topically 2 (two) times daily.    Dispense:  30  g    Refill:  2    Follow-up: Return in about 3 months (around 11/22/2021) for Chronic medical conditions.       [name], MD, FAAFP. [facility] and [facility], [place]   08/26/2021, 1:19 PM

## 2021-08-26 ENCOUNTER — Encounter: Payer: Self-pay | Admitting: Family Medicine

## 2021-08-27 ENCOUNTER — Other Ambulatory Visit: Payer: Self-pay | Admitting: Obstetrics and Gynecology

## 2021-08-27 NOTE — Patient Instructions (Signed)
Hi Ms. Bortner, I am sorry I could not reach you today, I hope you are doing well- as a part of your Medicaid benefit, you are eligible for care management and care coordination services at no cost or copay. I was unable to reach you by phone today but would be happy to help you with your health related needs. Please feel free to call me at [phone].  A member of the Managed Medicaid care management team will reach out to you again over the next 7-14 days.   [name] RN, BSN [place]   [facility] - Managed Medicaid High Risk [phone].

## 2021-08-27 NOTE — Patient Outreach (Signed)
Care Coordination  08/27/2021  [name] [name] [birth year] [id number]   Medicaid Managed Care   Unsuccessful Outreach Note  08/27/2021 Name: [name] [name] MRN: [id number] DOB: Jun 08, 1961  Referred by: [name], MD Reason for referral : High Risk Managed Medicaid (Unsuccessful telephone outreach)   An unsuccessful telephone outreach was attempted today. The patient was referred to the case management team for assistance with care management and care coordination. This is the second unsuccessful attempt by the Managed Medicaid Team,  [place], was unable to reach the patient 08/05/21.  Follow Up Plan: The care management team will reach out to the patient again over the next 7-14 days.   [name] RN, BSN [place]   [facility] - Managed Medicaid High Risk [phone].

## 2021-08-31 ENCOUNTER — Other Ambulatory Visit: Payer: Self-pay | Admitting: Obstetrics and Gynecology

## 2021-08-31 ENCOUNTER — Other Ambulatory Visit: Payer: Self-pay

## 2021-08-31 NOTE — Patient Outreach (Signed)
Care Coordination  08/31/2021  DAWSON HOLLMAN 1961/02/20 [id number]  RNCM called patient at scheduled time.  Patient answered phone and stated she was riding in the car-asked to be called back tomorrow.  RNCM scheduled appointment for 09/01/21  [name] RN, [place] Management Coordinator - Managed Medicaid High Risk [phone]

## 2021-09-01 ENCOUNTER — Other Ambulatory Visit: Payer: Self-pay

## 2021-09-01 ENCOUNTER — Other Ambulatory Visit: Payer: Self-pay | Admitting: Obstetrics and Gynecology

## 2021-09-01 NOTE — Patient Outreach (Signed)
Care Coordination  09/01/2021  KADESHIA KASPARIAN [birth year] [id number]  RNCM called patient at scheduled time-patient stated she could not talk right now and asked to call back.  RNCM rescheduled appointment at patient request.  [name] RN, [place] Management Coordinator - Managed [place] High Risk [phone].

## 2021-09-02 ENCOUNTER — Ambulatory Visit: Payer: Medicare Other

## 2021-09-04 ENCOUNTER — Other Ambulatory Visit: Payer: Self-pay

## 2021-09-04 NOTE — Patient Instructions (Signed)
Visit Information  [name] [name] was given information about Medicaid Managed Care team care coordination services as a part of their [place] Medicaid benefit. [name] [name] verbally consented to engagement with the [facility] Managed Care team.   If you are experiencing a medical emergency, please call 911 or report to your local emergency department or urgent care.   If you have a non-emergency medical problem during routine business hours, please contact your provider's office and ask to speak with a nurse.   For questions related to your [facility], please call: [phone] or visit the homepage here: [email or web address]  If you would like to schedule transportation through your [facility], please call the following number at least 2 days in advance of your appointment: [phone].  Rides for urgent appointments can also be made after hours by calling Member Services.  Call the [place] at [phone], at any time, 24 hours a day, 7 days a week. If you are in danger or need immediate medical attention call 911.  If you would like help to quit smoking, call 1-800-QUIT-NOW 815-635-1654) OR Espaol: 1-855-Djelo-Ya (1-030-131-4388) o para ms informacin haga clic aqu or Text READY to 200-400 to register via text  Ms. Scheiderer - following are the goals we discussed in your visit today:   Goals Addressed   None     The  Patient                                              has been provided with contact information for the Managed Medicaid care management team and has been advised to call with any health related questions or concerns.   [name] [name], BSW, [place] Managed Medicaid Team  [phone]   Following is a copy of your plan of care:  There are no care plans that you  recently modified to display for this patient.

## 2021-09-04 NOTE — Patient Outreach (Signed)
°Medicaid Managed Care °Social Work Note ° °09/04/2021 °Name:  [name] [name] MRN:  [id number] DOB:  06/23/1961 ° °[name] [name] is an 60 y.o. year old female who is a primary patient of Newlin, Enobong, MD.  The Medicaid Managed Care Coordination team was consulted for assistance with:   follow up ° °[name] [name] was given information about Medicaid Managed Care Coordination team services today. [name] [name] Patient agreed to services and verbal consent obtained. ° °Engaged with patient  for by telephone forfollow up visit in response to referral for case management and/or care coordination services.  ° °Assessments/Interventions:  Review of past medical history, allergies, medications, health status, including review of consultants reports, laboratory and other test data, was performed as part of comprehensive evaluation and provision of chronic care management services. ° °SDOH: (Social Determinant of Health) assessments and interventions performed: °BSW completed follow up call with patient. She stated she did get an appointment for the reduction. Patient states no resources are needed at this time.  ° °Advanced Directives Status:  Not addressed in this encounter. ° °Care Plan °                °Allergies  °Allergen Reactions  ° Lisinopril Swelling  ° ° °Medications Reviewed Today   ° ° Reviewed by Craft, Terri G, RN (Registered Nurse) on 09/01/21 at 0936  Med List Status: <None>  ° °Medication Order Taking? Sig Documenting Provider Last Dose Status Informant  °albuterol (PROVENTIL) (2.5 MG/3ML) 0.083% nebulizer solution [id number] No Take 3 mLs (2.5 mg total) by nebulization every 4 (four) hours as needed for wheezing or shortness of breath. Henderly, Britni A, PA-C Taking Active Self  °albuterol (VENTOLIN HFA) 108 (90 Base) MCG/ACT inhaler [id number] No Inhale 1 puff into the lungs every 4 (four) hours as needed for wheezing or shortness of breath. Newlin, Enobong, MD Taking Active Self  °amLODipine  (NORVASC) 10 MG tablet [id number] No Take 1 tablet (10 mg total) by mouth daily. Hackney, Tina A, FNP Taking Active Self  °Artificial Tear Ointment (DRY EYES OP) [id number] No Place 1 drop into both eyes daily as needed (dry eyes). [provider] Taking Active Self  °atorvastatin (LIPITOR) 40 MG tablet [id number] No Take 1 tablet (40 mg total) by mouth every morning. Must have HF appt for further refills Hackney, Tina A, FNP Taking Active Self  °blood glucose meter kit and supplies KIT [id number] No Dispense based on patient and insurance preference. Use up to four times daily as directed. (FOR ICD-9 250.00, 250.01). Patel, Shreyang, MD Taking Active Self  °carvedilol (COREG) 12.5 MG tablet [id number] No Take 1 tablet (12.5 mg total) by mouth 2 (two) times daily. Hochrein, James, MD Taking Active   °ferrous sulfate 325 (65 FE) MG tablet [id number] No Take 325 mg by mouth daily with breakfast. [provider] Taking Active Self  °FLOVENT HFA 110 MCG/ACT inhaler [id number] No INHALE TWO PUFFS BY MOUTH INTO LUNGS TWICE DAILY Newlin, Enobong, MD Taking Active   °hydrALAZINE (APRESOLINE) 50 MG tablet [id number] No TAKE ONE TABLET BY MOUTH THREE TIMES DAILY Newlin, Enobong, MD Taking Active   °insulin glargine (LANTUS SOLOSTAR) 100 UNIT/ML Solostar Pen [id number] No Inject 58 Units into the skin daily. Newlin, Enobong, MD Taking Active Self  °Misc. Devices MISC [id number] No 2 L of oxygen via nasal cannula.  Diagnosis-chronic respiratory failure with hypoxia Newlin, Enobong, MD Taking Active Self  °nystatin ointment (MYCOSTATIN) [id number]  Apply 1 application topically 2 (two) times daily.   Newlin, Enobong, MD  Active   °potassium chloride SA (KLOR-CON) 20 MEQ tablet [id number] No Take 1 tablet (20 mEq total) by mouth daily. Kc, Ramesh, MD 05/05/2021 Expired 05/05/21 2359 Self  °pregabalin (LYRICA) 75 MG capsule [id number] No TAKE ONE CAPSULE BY MOUTH TWICE DAILY Newlin, Enobong, MD Taking Active   °spironolactone  (ALDACTONE) 25 MG tablet [id number] No Take 1 tablet (25 mg total) by mouth daily. Newlin, Enobong, MD Taking Active Self  °Torsemide 40 MG TABS [id number] No Take 40 mg by mouth daily for 5 days, THEN 40 mg every other day.  °Patient taking differently: Take 40mg by mouth every other day  ° Winfrey, William B, MD 05/05/2021 Expired 05/18/21 2359 Self  °VICTOZA 18 MG/3ML SOPN [id number] No INJECT 1.8 MG into THE SKIN ONCE DAILY AS DIRECTED Newlin, Enobong, MD Taking Active   ° °  °  ° °  ° ° °Patient Active Problem List  ° Diagnosis Date Noted  ° CKD (chronic kidney disease) stage 4, GFR 15-29 ml/min (HCC) 07/03/2021  ° Chronic diastolic CHF (congestive heart failure) (HCC) 05/06/2021  ° Acute respiratory failure due to COVID-19 (HCC) 05/06/2021  ° COVID-19 05/05/2021  ° CHF exacerbation (HCC) 04/02/2021  ° Chest pain 04/02/2021  ° Acute-on-chronic kidney injury (HCC) 04/02/2021  ° COPD exacerbation (HCC) 12/05/2020  ° COPD with acute exacerbation (HCC) 12/04/2020  ° CKD stage 3 due to type 2 diabetes mellitus (HCC) 12/04/2020  ° Anemia of chronic disease 12/04/2020  ° Class 3 obesity (HCC) 07/27/2020  ° Acute respiratory disease due to COVID-19 virus 07/25/2020  ° Acute hypoxemic respiratory failure due to COVID-19 (HCC) 07/25/2020  ° Morbid obesity (HCC) 11/23/2019  ° Acute respiratory failure with hypoxia (HCC) 11/06/2019  ° Chronic kidney disease, stage 3b (HCC) 11/06/2019  ° COPD with acute bronchitis (HCC)   ° Acute kidney failure (HCC) 08/18/2019  ° Left ventricular hypertrophy 07/21/2019  ° Educated about COVID-19 virus infection 07/21/2019  ° Acute diastolic HF (heart failure) (HCC)   ° Hypoxia   ° Dyspnea on exertion 05/09/2019  ° Sleep apnea 05/09/2019  ° Hyperglycemia 04/09/2019  ° Snoring 03/13/2019  ° Symptomatic anemia 08/17/2018  ° Acute renal failure (ARF) (HCC) 08/17/2018  ° Chronic cystitis 08/03/2018  ° Diabetic neuropathy (HCC) 06/16/2017  ° Non compliance w medication regimen 04/12/2017  ° Family  history of colon cancer in mother 11/17/2016  ° Dyspareunia in female 11/11/2016  ° Screen for colon cancer 11/11/2016  ° History of ovarian cyst 11/11/2016  ° Chronic hepatitis C without hepatic coma (HCC) 11/09/2016  ° Hyperlipidemia 10/07/2016  ° Insulin dependent type 2 diabetes mellitus (HCC) 01/29/2014  ° Status post intraocular lens implant 11/08/2013  ° PCO (posterior capsular opacification) 09/28/2013  ° Pseudophakia 09/12/2013  ° GERD (gastroesophageal reflux disease) 09/06/2013  ° Hypertension 09/06/2013  ° BREAST PAIN, BILATERAL 02/19/2010  ° ANEMIA, IRON DEFICIENCY 10/03/2009  ° LEUKOPENIA, MILD 09/19/2009  ° LIPOMA 06/18/2009  ° EUSTACHIAN TUBE DYSFUNCTION 06/18/2009  ° TINEA PEDIS 05/07/2009  ° SKIN LESION 05/07/2009  ° COUGH 05/07/2009  ° SUBSTANCE ABUSE, MULTIPLE 02/22/2007  ° HCVD (hypertensive cardiovascular disease) 02/22/2007  ° ° °Conditions to be addressed/monitored per PCP order:   follow up ° °There are no care plans that you recently modified to display for this patient. ° ° °Follow up:  Patient agrees to Care Plan and Follow-up. ° °Plan: The  Patient has been provided with contact information for the Managed Medicaid care management team and has been   been advised to call with any health related questions or concerns.    [name], BSW, [place] Managed Medicaid Team  [phone]

## 2021-09-07 ENCOUNTER — Other Ambulatory Visit: Payer: Self-pay

## 2021-09-07 ENCOUNTER — Other Ambulatory Visit: Payer: Self-pay | Admitting: Family Medicine

## 2021-09-07 ENCOUNTER — Encounter (HOSPITAL_BASED_OUTPATIENT_CLINIC_OR_DEPARTMENT_OTHER): Payer: Self-pay

## 2021-09-07 ENCOUNTER — Emergency Department (HOSPITAL_BASED_OUTPATIENT_CLINIC_OR_DEPARTMENT_OTHER)
Admission: EM | Admit: 2021-09-07 | Discharge: 2021-09-07 | Disposition: A | Discharge status: Home / Self Care | Payer: Medicare Other | Attending: Emergency Medicine | Admitting: Emergency Medicine

## 2021-09-07 DIAGNOSIS — Z79899 Other long term (current) drug therapy: Secondary | ICD-10-CM | POA: Diagnosis not present

## 2021-09-07 DIAGNOSIS — D649 Anemia, unspecified: Secondary | ICD-10-CM | POA: Diagnosis not present

## 2021-09-07 DIAGNOSIS — N189 Chronic kidney disease, unspecified: Secondary | ICD-10-CM | POA: Insufficient documentation

## 2021-09-07 DIAGNOSIS — Z20822 Contact with and (suspected) exposure to covid-19: Secondary | ICD-10-CM | POA: Insufficient documentation

## 2021-09-07 DIAGNOSIS — Z743 Need for continuous supervision: Secondary | ICD-10-CM | POA: Diagnosis not present

## 2021-09-07 DIAGNOSIS — I130 Hypertensive heart and chronic kidney disease with heart failure and stage 1 through stage 4 chronic kidney disease, or unspecified chronic kidney disease: Secondary | ICD-10-CM | POA: Insufficient documentation

## 2021-09-07 DIAGNOSIS — Z794 Long term (current) use of insulin: Secondary | ICD-10-CM | POA: Diagnosis not present

## 2021-09-07 DIAGNOSIS — I5030 Unspecified diastolic (congestive) heart failure: Secondary | ICD-10-CM | POA: Insufficient documentation

## 2021-09-07 DIAGNOSIS — E162 Hypoglycemia, unspecified: Secondary | ICD-10-CM | POA: Diagnosis not present

## 2021-09-07 DIAGNOSIS — E10649 Type 1 diabetes mellitus with hypoglycemia without coma: Secondary | ICD-10-CM | POA: Diagnosis not present

## 2021-09-07 DIAGNOSIS — E11649 Type 2 diabetes mellitus with hypoglycemia without coma: Secondary | ICD-10-CM | POA: Insufficient documentation

## 2021-09-07 DIAGNOSIS — R6889 Other general symptoms and signs: Secondary | ICD-10-CM | POA: Diagnosis not present

## 2021-09-07 DIAGNOSIS — E161 Other hypoglycemia: Secondary | ICD-10-CM | POA: Diagnosis not present

## 2021-09-07 DIAGNOSIS — R531 Weakness: Secondary | ICD-10-CM | POA: Diagnosis not present

## 2021-09-07 DIAGNOSIS — E1149 Type 2 diabetes mellitus with other diabetic neurological complication: Secondary | ICD-10-CM

## 2021-09-07 LAB — RESP PANEL BY RT-PCR (FLU A&B, COVID) ARPGX2
Influenza A by PCR: NEGATIVE
Influenza B by PCR: NEGATIVE
SARS Coronavirus 2 by RT PCR: NEGATIVE

## 2021-09-07 LAB — CBC
HCT: 28.6 % — ABNORMAL LOW (ref 36.0–46.0)
Hemoglobin: 8.7 g/dL — ABNORMAL LOW (ref 12.0–15.0)
MCH: 26.1 pg (ref 26.0–34.0)
MCHC: 30.4 g/dL (ref 30.0–36.0)
MCV: 85.9 fL (ref 80.0–100.0)
Platelets: 175 10*3/uL (ref 150–400)
RBC: 3.33 MIL/uL — ABNORMAL LOW (ref 3.87–5.11)
RDW: 13.4 % (ref 11.5–15.5)
WBC: 3.5 10*3/uL — ABNORMAL LOW (ref 4.0–10.5)
nRBC: 0 % (ref 0.0–0.2)

## 2021-09-07 LAB — URINALYSIS, ROUTINE W REFLEX MICROSCOPIC
Bilirubin Urine: NEGATIVE
Glucose, UA: NEGATIVE mg/dL
Hgb urine dipstick: NEGATIVE
Ketones, ur: NEGATIVE mg/dL
Leukocytes,Ua: NEGATIVE
Nitrite: NEGATIVE
Specific Gravity, Urine: 1.011 (ref 1.005–1.030)
pH: 5 (ref 5.0–8.0)

## 2021-09-07 LAB — CBG MONITORING, ED
Glucose-Capillary: 115 mg/dL — ABNORMAL HIGH (ref 70–99)
Glucose-Capillary: 111 mg/dL — ABNORMAL HIGH (ref 70–99)
Glucose-Capillary: 123 mg/dL — ABNORMAL HIGH (ref 70–99)
Glucose-Capillary: 105 mg/dL — ABNORMAL HIGH (ref 70–99)

## 2021-09-07 LAB — BASIC METABOLIC PANEL
Anion gap: 10 (ref 5–15)
BUN: 46 mg/dL — ABNORMAL HIGH (ref 6–20)
CO2: 18 mmol/L — ABNORMAL LOW (ref 22–32)
Calcium: 9.5 mg/dL (ref 8.9–10.3)
Chloride: 113 mmol/L — ABNORMAL HIGH (ref 98–111)
Creatinine, Ser: 1.4 mg/dL — ABNORMAL HIGH (ref 0.44–1.00)
GFR, Estimated: 43 mL/min — ABNORMAL LOW (ref >60)
Glucose, Bld: 122 mg/dL — ABNORMAL HIGH (ref 70–99)
Potassium: 4.5 mmol/L (ref 3.5–5.1)
Sodium: 141 mmol/L (ref 135–145)

## 2021-09-07 MED ORDER — SODIUM CHLORIDE 0.9% FLUSH
3.0000 mL | INTRAVENOUS | Status: DC | PRN
  Filled 2021-09-07: qty 3

## 2021-09-07 MED ORDER — SODIUM CHLORIDE 0.9% FLUSH
3.0000 mL | Freq: Two times a day (BID) | INTRAVENOUS | Status: DC
  Filled 2021-09-07: qty 3

## 2021-09-07 MED ORDER — SODIUM CHLORIDE 0.9 % IV SOLN: 250.0000 mL | INTRAVENOUS | Status: DC | PRN

## 2021-09-07 NOTE — Discharge Instructions (Addendum)
You are seen in the ER for low blood sugar.  All your blood work is overall reassuring.  There is evidence of slightly lower hemoglobin than normal and we recommend that you follow-up with your primary care doctor in 1 to 2 weeks for repeat CBC and electrolytes.  Additionally, we recommend that you check your blood sugar at least 3 times a day and write it down on a piece of paper. We do not want you to discontinue your Lantus at this time.  If you start having symptoms of hypoglycemia (read instructions provided), check your blood sugar immediately.  Please follow-up with your primary care doctor in 1 week for repeat evaluation.

## 2021-09-07 NOTE — ED Notes (Signed)
CBG 115 at 1453

## 2021-09-07 NOTE — ED Notes (Signed)
Pt eating sandwich with daughter at bedside

## 2021-09-07 NOTE — ED Triage Notes (Signed)
Patient BIB [facility] from Home for Hypoglycemia.   Per [facility] and Patient, the Patient was found by Family to be unresponsive to stimulus. Fire Department responded as CBG at Home was 50. Oral Glucose was given and once Patient aroused, Patient ate a PBJ Sandwich and Juice.   CBG with [facility] was 140. Fully Oriented Currently. No Pain. No N/V. History of CHF. [facility] states another Hypoglycemia Episode occurred recently.   NAD Noted during Triage. A&Ox4. GCS 15. BIB Stretcher.

## 2021-09-07 NOTE — ED Provider Notes (Signed)
[facility] EMERGENCY DEPT Provider Note   CSN: [id number] Arrival date & time: 09/07/21  1448     History  Chief Complaint  Patient presents with   Hypoglycemia    [name] [name] is a 61 y.o. female.  HPI     61 year old female comes in with chief complaint of low blood sugar.  Patient has history of insulin-dependent diabetes, hypertension, hyperlipidemia, hep C, diastolic CHF and CKD.  Patient here with her daughter.  According to the daughter, she had gone to work at 8:30 AM.  Patient was awake, acting normal.  Later in the day when she was on the phone with her, patient appeared confused.  Fire department assessed the patient and her blood sugar was noted to be 50.  Oral glucose given, blood sugar prior to ED arrival was 140.  Patient has no complaints at this time.  Indicates that she does not always wake up until later in the morning.  She had a normal day yesterday and ate normally.  This morning she had nothing to eat, she just was sleeping.  Review of system is negative for any burning with urination, blood in the urine, nausea, vomiting, abdominal pain, cough, URI-like symptoms.  There was an episode of hypoglycemia within the last week while patient was at a gas station.  Family denies any recent medication changes.  I reviewed patient's previous notes, it appears that her hemoglobin A1c is 8.6.  Home Medications Prior to Admission medications   Medication Sig Start Date End Date Taking? Authorizing Provider  albuterol (PROVENTIL) (2.5 MG/3ML) 0.083% nebulizer solution Take 3 mLs (2.5 mg total) by nebulization every 4 (four) hours as needed for wheezing or shortness of breath. 02/12/21   Henderly, Britni A, PA-C  albuterol (VENTOLIN HFA) 108 (90 Base) MCG/ACT inhaler Inhale 1 puff into the lungs every 4 (four) hours as needed for wheezing or shortness of breath. 12/10/20   [name], MD  amLODipine (NORVASC) 10 MG tablet Take 1 tablet (10 mg  total) by mouth daily. 09/03/20   [name], FNP  Artificial Tear Ointment (DRY EYES OP) Place 1 drop into both eyes daily as needed (dry eyes).    [provider]  atorvastatin (LIPITOR) 40 MG tablet Take 1 tablet (40 mg total) by mouth every morning. Must have HF appt for further refills 12/09/20   [name], FNP  blood glucose meter kit and supplies KIT Dispense based on patient and insurance preference. Use up to four times daily as directed. (FOR ICD-9 250.00, 250.01). 04/12/19   Dustin Flock, MD  carvedilol (COREG) 12.5 MG tablet Take 1 tablet (12.5 mg total) by mouth 2 (two) times daily. 07/03/21 10/01/21  Minus Breeding, MD  ferrous sulfate 325 (65 FE) MG tablet Take 325 mg by mouth daily with breakfast.    [provider]  FLOVENT HFA 110 MCG/ACT inhaler INHALE TWO PUFFS BY MOUTH INTO LUNGS TWICE DAILY 06/08/21   [name], MD  hydrALAZINE (APRESOLINE) 50 MG tablet TAKE ONE TABLET BY MOUTH THREE TIMES DAILY 07/09/21   [name], MD  insulin glargine (LANTUS SOLOSTAR) 100 UNIT/ML Solostar Pen Inject 58 Units into the skin daily. 04/21/21   [name], MD  Misc. Devices MISC 2 L of oxygen via nasal cannula.  Diagnosis-chronic respiratory failure with hypoxia 04/21/21   [name], MD  nystatin ointment (MYCOSTATIN) Apply 1 application topically 2 (two) times daily. 08/25/21   [name], MD  potassium chloride SA (KLOR-CON) 20  MEQ tablet Take 1 tablet (20 mEq total) by mouth daily. 04/05/21 05/05/21  [name], MD  pregabalin (LYRICA) 75 MG capsule TAKE ONE CAPSULE BY MOUTH TWICE DAILY 07/22/21   [name], MD  spironolactone (ALDACTONE) 25 MG tablet Take 1 tablet (25 mg total) by mouth daily. 04/21/21   [name], MD  Torsemide 40 MG TABS Take 40 mg by mouth daily for 5 days, THEN 40 mg every other day. Patient taking differently: Take 68m by mouth every other day 04/13/21 05/18/21  WKatherine Roan MD  VICTOZA 18 MG/3ML SOPN  INJECT 1.8 MG into THE SKIN ONCE DAILY AS DIRECTED 05/15/21   NCharlott Rakes MD      Allergies    Lisinopril    Review of Systems   Review of Systems  Constitutional:  Positive for activity change.  Respiratory:  Negative for shortness of breath.   Cardiovascular:  Negative for chest pain.  Gastrointestinal:  Negative for nausea and vomiting.   Physical Exam Updated Vital Signs BP (!) 176/74    Pulse (!) 102    Temp 97.7 F (36.5 C) (Oral)    Resp 16    Ht _0  (1.676 m)    Wt 107.9 kg    LMP 04/01/2010    SpO2 93%    BMI 38.39 kg/m  Physical Exam Vitals and nursing note reviewed.  Constitutional:      Appearance: She is well-developed.  HENT:     Head: Atraumatic.  Eyes:     Extraocular Movements: Extraocular movements intact.  Cardiovascular:     Rate and Rhythm: Normal rate.  Pulmonary:     Effort: Pulmonary effort is normal.  Musculoskeletal:     Cervical back: Normal range of motion and neck supple.  Skin:    General: Skin is warm and dry.  Neurological:     Mental Status: She is alert and oriented to person, place, and time.    ED Results / Procedures / Treatments   Labs (all labs ordered are listed, but only abnormal results are displayed) Labs Reviewed  CBC - Abnormal; Notable for the following components:      Result Value   WBC 3.5 (*)    RBC 3.33 (*)    Hemoglobin 8.7 (*)    HCT 28.6 (*)    All other components within normal limits  BASIC METABOLIC PANEL - Abnormal; Notable for the following components:   Chloride 113 (*)    CO2 18 (*)    Glucose, Bld 122 (*)    BUN 46 (*)    Creatinine, Ser 1.40 (*)    GFR, Estimated 43 (*)    All other components within normal limits  URINALYSIS, ROUTINE W REFLEX MICROSCOPIC - Abnormal; Notable for the following components:   Protein, ur TRACE (*)    All other components within normal limits  CBG MONITORING, ED - Abnormal; Notable for the following components:   Glucose-Capillary 115 (*)    All other  components within normal limits  CBG MONITORING, ED - Abnormal; Notable for the following components:   Glucose-Capillary 111 (*)    All other components within normal limits  CBG MONITORING, ED - Abnormal; Notable for the following components:   Glucose-Capillary 123 (*)    All other components within normal limits  CBG MONITORING, ED - Abnormal; Notable for the following components:   Glucose-Capillary 105 (*)    All other components within normal limits  RESP PANEL BY RT-PCR (FLU A&B, COVID)  ARPGX2    EKG None  Radiology No results found.  Procedures Procedures    Medications Ordered in ED Medications  sodium chloride flush (NS) 0.9 % injection 3 mL (has no administration in time range)  sodium chloride flush (NS) 0.9 % injection 3 mL (has no administration in time range)  0.9 %  sodium chloride infusion (has no administration in time range)    ED Course/ Medical Decision Making/ A&P Clinical Course as of 09/07/21 1847  Mon Sep 07, 2021  1842 Patient reassessed on 2 separate occasions.  Her blood glucose has stayed normal.  Patient still slightly tachycardic. She again denies any cough, URI-like symptoms, abdominal pain, vomiting, diarrhea or UTI-like symptoms.  Her lab work-up is reassuring.  Specifically, COVID-19, UA are normal.  Electrolytes are within normal limits.  Repeat CBG has stayed stable and above dangerous levels.  Patient has already passed oral challenge.  She wants to now go home.  In the light of her having a similar episode just few days back, but her A1c being over 8 I have advised her to check her blood sugars frequently.  I have sent a message to her PCP about this visit, hopefully they will follow-up with her soon.  I have also asked patient to keep a log of her blood sugars for the PCP. [AN]  1847 BUN(!): 46 Slightly elevated BUN.  Creatinine is at baseline. [AN]  1847 Hemoglobin(!): 8.7 Slowly downtrending hemoglobin.  Patient denies any bloody  stools.  Will advise repeat evaluation of metabolic profile and CBC by PCP. [AN]    Clinical Course User Index [AN] Varney Biles, MD                           Medical Decision Making Amount and/or Complexity of Data Reviewed Labs: ordered. Decision-making details documented in ED Course.  Risk Prescription drug management.  61 year old female comes in with chief complaint of unresponsiveness in the setting of hypoglycemia. She has pertinent past medical history of CKD, poorly controlled diabetes with A1c of 8.6 which further complicates the presenting complaint. The complaint involves an extensive differential diagnosis and treatment options and also carries with it a high risk of complications and morbidity.    The differential diagnosis for her hypoglycemia includes accidental insulin overdose, reduced p.o. intake, infection, malnutrition, worsening renal function.  I have reviewed patient's PCP notes.  It appears that her A1c is 8.6, and there were no new insulin changes made at that time.  Given that in general she has poorly controlled diabetes, and she had 2 recent hypoglycemic events -wondering if patient's compliance to her medication has improved.  Daughter states that in general patient is not extremely compliant, but that she has been monitoring her closely now.  The initial plan is to order basic labs and to treat patient with CBG every hour into 3 episodes.  We will also screen for infection, get urine analysis.  No no fevers, no recent illness per family and the patient which is reassuring  Oral challenge already initiated.  Reassessment and review (also see workup area): Lab Tests: I Ordered, and personally interpreted labs.  The pertinent results include:   CBC is mild leukopenia, metabolic profile is reassuring, UA shows no signs of infection.   Complexity of problems addressed: Patients presentation is most consistent with  acute illness/injury with systemic  symptoms During patient's assessment  Disposition: After consideration of the diagnostic results and the patients  response to treatment,  I feel that the patent would benefit from discharge with strict ER return precautions. .    Final Clinical Impression(s) / ED Diagnoses Final diagnoses:  Hypoglycemia  Anemia, unspecified type    Rx / DC Orders ED Discharge Orders     None         Varney Biles, MD 09/11/21 1437

## 2021-09-07 NOTE — ED Notes (Signed)
Report received and care resumed.

## 2021-09-08 ENCOUNTER — Other Ambulatory Visit: Payer: Self-pay | Admitting: Obstetrics and Gynecology

## 2021-09-08 ENCOUNTER — Telehealth: Payer: Self-pay

## 2021-09-08 NOTE — Telephone Encounter (Signed)
Requested medication (s) are due for refill today: HAs refill  Requested medication (s) are on the active medication list: yes    Last refill: 07/22/21  #60  1 refill  Future visit scheduled no  Notes to clinic:Not delegated, please review.  Requested Prescriptions  Pending Prescriptions Disp Refills   pregabalin (LYRICA) 75 MG capsule [Pharmacy Med Name: pregabalin 75 mg capsule] 60 capsule 1    Sig: TAKE ONE CAPSULE BY MOUTH TWICE DAILY     Not Delegated - Neurology:  Anticonvulsants - Controlled - pregabalin Failed - 09/07/2021  8:03 AM      Failed - This refill cannot be delegated      Failed - Cr in normal range and within 360 days    Creat  Date Value Ref Range Status  12/12/2012 0.88 0.50 - 1.10 mg/dL Final   Creatinine, Ser  Date Value Ref Range Status  09/07/2021 1.40 (H) 0.44 - 1.00 mg/dL Final   Creatinine,U  Date Value Ref Range Status  06/13/2007 45.3 mg/dL Final    Comment:    See lab report for associated comment(s)          Passed - Completed PHQ-2 or PHQ-9 in the last 360 days      Passed - Valid encounter within last 12 months    Recent Outpatient Visits           2 weeks ago Type 2 diabetes mellitus with other neurologic complication, with long-term current use of insulin ([place])   [place], [place], MD   4 months ago Chronic respiratory failure with hypoxia ([place])   [place], Charlane Ferretti, MD   5 months ago Encounter for [employer] annual wellness exam   [place], [place], MD   6 months ago Type 2 diabetes mellitus with other neurologic complication, with long-term current use of insulin ([place])   [place], [place], MD   1 year ago Type 2 diabetes mellitus with stage 3b chronic kidney disease, with long-term current use of insulin ([place])   [place], [place], MD        Future Appointments             In 2 months [name], MD [place]   In 3 months [place], Phill Myron, NP [facility] [place], [facility]

## 2021-09-08 NOTE — Patient Outreach (Signed)
Care Coordination  09/08/2021  ELBONY MCCLIMANS 06-26-1961 [id number]   Medicaid Managed Care   Unsuccessful Outreach Note  09/08/2021 Name: FREDDYE CARDAMONE MRN: [id number] DOB: 16-Jan-1961  Referred by: [name], MD Reason for referral : High Risk Managed Medicaid (Unsuccessful telephone outreach)   An unsuccessful telephone outreach was attempted today. The patient was referred to the case management team for assistance with care management and care coordination.   Follow Up Plan: The care management team will reach out to the patient again over the next 7-14 days.   [name] RN, BSN [place]   [facility] - Managed Medicaid High Risk [phone]

## 2021-09-08 NOTE — Telephone Encounter (Signed)
Transition Care Management Unsuccessful Follow-up Telephone Call  Date of discharge and from where:  09/07/2021 from [facility]  Attempts:  1st Attempt  Reason for unsuccessful TCM follow-up call:  Left voice message

## 2021-09-08 NOTE — Patient Instructions (Signed)
Hey [name] [name],I am sorry I could not reach you today, I hope you are okay - as a part of your Medicaid benefit, you are eligible for care management and care coordination services at no cost or copay. I was unable to reach you by phone today but would be happy to help you with your health related needs. Please feel free to call me at [phone]  A member of the Managed Medicaid care management team will reach out to you again over the next 7 -14 days.   [name] RN, BSN [place]   [facility] - Managed Medicaid High Risk [phone]

## 2021-09-09 ENCOUNTER — Encounter (HOSPITAL_BASED_OUTPATIENT_CLINIC_OR_DEPARTMENT_OTHER): Payer: Self-pay | Admitting: Cardiovascular Disease

## 2021-09-09 NOTE — Procedures (Signed)
° °     ° ° ° ° ° ° °Patient Name: [name], [name] °Study Date: 08/18/2021 °Gender: Female °D.O.B: 06/25/1961 °Age (years): 60 °Referring Provider: James Hochrein °Height (inches): 66 °Interpreting Physician: Thomas Kelly MD, ABSM °Weight (lbs): 240 °RPSGT: Gregory, [place] °BMI: 39 °MRN: [id number] °Neck Size: 15.00 ° °CLINICAL INFORMATION °Sleep Study Type: NPSG ° °Indication for sleep study: COPD, Diabetes, Hypertension, Obesity, OSA, Snoring, Witnessed Apneas ° °Epworth Sleepiness Score: 16 ° °Most recent polysomnogram dated 02/24/2019 revealed an AHI of 12.9/h and RDI of 12.9/h. Most recent titration study dated 03/13/2019 was optimal at 24.21cm H2O with an AHI of 0.0/h. ° °SLEEP STUDY TECHNIQUE °As per the AASM Manual for the Scoring of Sleep and Associated Events v2.3 (April 2016) with a hypopnea requiring 4% desaturations. ° °The channels recorded and monitored were frontal, central and occipital EEG, electrooculogram (EOG), submentalis EMG (chin), nasal and oral airflow, thoracic and abdominal wall motion, anterior tibialis EMG, snore microphone, electrocardiogram, and pulse oximetry. ° °MEDICATIONS °albuterol (PROVENTIL) (2.5 MG/3ML) 0.083% nebulizer solution °albuterol (VENTOLIN HFA) 108 (90 Base) MCG/ACT inhaler °amLODipine (NORVASC) 10 MG tablet °Artificial Tear Ointment (DRY EYES OP) °atorvastatin (LIPITOR) 40 MG tablet °blood glucose meter kit and supplies KIT °carvedilol (COREG) 12.5 MG tablet °ferrous sulfate 325 (65 FE) MG tablet °FLOVENT HFA 110 MCG/ACT inhaler °hydrALAZINE (APRESOLINE) 50 MG tablet °insulin glargine (LANTUS SOLOSTAR) 100 UNIT/ML Solostar Pen °Misc. Devices MISC °nystatin ointment (MYCOSTATIN) °potassium chloride SA (KLOR-CON) 20 MEQ tablet (Expired) °pregabalin (LYRICA) 75 MG capsule °spironolactone (ALDACTONE) 25 MG tablet °Torsemide 40 MG TABS (Expired) °VICTOZA 18 MG/3ML SOPN  °Medications self-administered by patient taken the night of the study : lyrica, CARVEDILOL, FERROUS  SULFATE, HYDRALAZINE, PREGABALIN, LANTUS ° °SLEEP ARCHITECTURE °The study was initiated at 11:13:33 PM and ended at 5:13:30 AM. ° °Sleep onset time was 1.7 minutes and the sleep efficiency was 92.5%%. The total sleep time was 333 minutes. ° °Stage REM latency was 80.5 minutes. ° °The patient spent 1.7%% of the night in stage N1 sleep, 69.5%% in stage N2 sleep, 0.0%% in stage N3 and 28.8% in REM. ° °Alpha intrusion was absent. ° °Supine sleep was 100.00%. ° °RESPIRATORY PARAMETERS °The overall apnea/hypopnea index (AHI) was 13.0 per hour. The respiratory disturbance index (RDI) was 14.6/h. There were 6 total apneas, including 6 obstructive, 0 central and 0 mixed apneas. There were 66 hypopneas and 9 RERAs. ° °The AHI during Stage REM sleep was 29.4 per hour. ° °AHI while supine was 13.0 per hour. ° °The mean oxygen saturation was 90.1%. The minimum SpO2 during sleep was 54.0%. ° °Moderate snoring was noted during this study. ° °CARDIAC DATA °The 2 lead EKG demonstrated sinus rhythm. The mean heart rate was 99.6 beats per minute. Other EKG findings include: None. ° °LEG MOVEMENT DATA °The total PLMS were 0 with a resulting PLMS index of 0.0. Associated arousal with leg movement index was 0.0 . ° °IMPRESSIONS °- Mild obstructive sleep apnea overall (AHI 13.0/h; RDI 14.6/h); however, sleep apnea was moderately severe during REM sleep (AHI 29.4/h. °- Severe oxygen desaturation was noted during this study (Min O2 54.0%). Time spent < 89% was 81.7 minutes. °- The patient snored with moderate snoring volume. °- No cardiac abnormalities were noted during this study. °- Clinically significant periodic limb movements did not occur during sleep. No significant associated arousals. ° °DIAGNOSIS °- Obstructive Sleep Apnea (G47.33) °- Nocturnal Hypoxemia (G47.36) ° °RECOMMENDATIONS °- In this symptomatic patient with cardiovascular comorbidities and significant oxygen desaturation recommend an   in-lab therapeutic CPAP titration to  determine optimal pressure required to alleviate sleep disordered breathing. If unable to obtain an in-lab titration, initiate Auto-PAP with EPR of 3 at 8 - 20 cm of water.  °- Effort should be made to optimize nasal and oropharyngeal patency. °- Positional therapy avoiding supine position during sleep. °- Avoid alcohol, sedatives and other CNS depressants that may worsen sleep apnea and disrupt normal sleep architecture. °- Sleep hygiene should be reviewed to assess factors that may improve sleep quality. °- Weight management (BMI 39) and regular exercise should be initiated or continued if appropriate. ° °[Electronically signed] 09/09/2021 09:28 AM ° °Thomas Kelly MD, FACC, ABSM °Diplomate, American Board of Sleep Medicine ° ° °NPI: [id number] ° °[facility] °PH: [phone]   FX: [phone] °ACCREDITED BY THE AMERICAN ACADEMY OF SLEEP MEDICINE ° °

## 2021-09-09 NOTE — Procedures (Deleted)
° ° °  NAME: [name] [name] DATE OF BIRTH:  02-06-1961 MEDICAL RECORD NUMBER [id number]  LOCATION: [facility]  PHYSICIAN: Shelva Majestic  DATE OF STUDY: 08/18/2021  SLEEP STUDY TYPE: Out of Center Sleep Test                REFERRING PHYSICIAN: Minus Breeding, MD  INDICATION FOR STUDY: ***  EPWORTH SLEEPINESS SCORE:   HEIGHT: 5\' 6"  (167.6 cm)  WEIGHT: 240 lb (108.9 kg)    Body mass index is 38.74 kg/m.  NECK SIZE: 15 in.  MEDICATIONS: ***  IMPRESSION:  ***    RECOMMENDATION:  ***   [place], American Board of Sleep Medicine  ELECTRONICALLY SIGNED ON:  09/09/2021, 9:29 AM [place] PH: [phone]   FX: [phone] [place]

## 2021-09-10 NOTE — Telephone Encounter (Signed)
Transition Care Management Unsuccessful Follow-up Telephone Call  Date of discharge and from where:  09/07/2021 from [facility]  Attempts:  2nd Attempt  Reason for unsuccessful TCM follow-up call:  Left voice message

## 2021-09-11 NOTE — Telephone Encounter (Signed)
Transition Care Management Unsuccessful Follow-up Telephone Call  Date of discharge and from where:  09/07/2021-[facility]  Attempts:  3rd Attempt  Reason for unsuccessful TCM follow-up call:  Left voice message

## 2021-09-14 ENCOUNTER — Other Ambulatory Visit: Payer: Self-pay

## 2021-09-14 ENCOUNTER — Ambulatory Visit (INDEPENDENT_AMBULATORY_CARE_PROVIDER_SITE_OTHER): Payer: Medicare Other | Admitting: Plastic Surgery

## 2021-09-14 ENCOUNTER — Other Ambulatory Visit: Payer: Medicare Other

## 2021-09-14 ENCOUNTER — Encounter: Payer: Self-pay | Admitting: Plastic Surgery

## 2021-09-14 VITALS — BP 101/57 | HR 90 | Ht 64.0 in | Wt 246.0 lb

## 2021-09-14 DIAGNOSIS — G8929 Other chronic pain: Secondary | ICD-10-CM

## 2021-09-14 DIAGNOSIS — N62 Hypertrophy of breast: Secondary | ICD-10-CM

## 2021-09-14 DIAGNOSIS — M546 Pain in thoracic spine: Secondary | ICD-10-CM

## 2021-09-14 NOTE — Progress Notes (Signed)
Referring Provider [name], MD [place],  [place] [address]   CC:  Breast hypertrophy and back pain   [name] [name] is an 61 y.o. female.  HPI:  Mammary Hyperplasia: The patient is a 61 y.o. female with a history of mammary hyperplasia for several years.  She has extremely large breasts causing symptoms that include the following: Back pain in the upper and lower back, including neck pain. She pulls or pins her bra straps to provide better lift and relief of the pressure and pain. She notices relief by holding her breast up manually.  Her shoulder straps cause grooves and pain and pressure that requires padding for relief. Pain medication is sometimes required with motrin and tylenol.  Activities that are hindered by enlarged breasts include: exercise and running.  She has tried supportive clothing as well as fitted bras without improvement.  Past medical history is notable for diabetes and her hemoglobin A1c was most recently 8.6.  Her breasts are extremely large and fairly symmetric with the left larger.  She has hyperpigmentation of the inframammary area on both sides.  The sternal to nipple distance on the right is 41 cm and the left is 42 cm.  The IMF distance is 18 cm. The BMI = 42.  Preoperative bra size = F cup.  The estimated excess breast tissue to be removed at the time of surgery = 1000 grams on each side.  Mammogram history: [facility], no abnormalities.  Family history of breast cancer:  none.  Tobacco use:  none.   The patient expresses the desire to pursue surgical intervention.   Allergies  Allergen Reactions   Lisinopril Swelling    Outpatient Encounter Medications as of 09/14/2021  Medication Sig   albuterol (PROVENTIL) (2.5 MG/3ML) 0.083% nebulizer solution Take 3 mLs (2.5 mg total) by nebulization every 4 (four) hours as needed for wheezing or shortness of breath.   albuterol (VENTOLIN HFA) 108 (90 Base) MCG/ACT inhaler Inhale 1 puff  into the lungs every 4 (four) hours as needed for wheezing or shortness of breath.   amLODipine (NORVASC) 10 MG tablet Take 1 tablet (10 mg total) by mouth daily.   Artificial Tear Ointment (DRY EYES OP) Place 1 drop into both eyes daily as needed (dry eyes).   atorvastatin (LIPITOR) 40 MG tablet Take 1 tablet (40 mg total) by mouth every morning. Must have HF appt for further refills   blood glucose meter kit and supplies KIT Dispense based on patient and insurance preference. Use up to four times daily as directed. (FOR ICD-9 250.00, 250.01).   carvedilol (COREG) 12.5 MG tablet Take 1 tablet (12.5 mg total) by mouth 2 (two) times daily.   ferrous sulfate 325 (65 FE) MG tablet Take 325 mg by mouth daily with breakfast.   FLOVENT HFA 110 MCG/ACT inhaler INHALE TWO PUFFS BY MOUTH INTO LUNGS TWICE DAILY   hydrALAZINE (APRESOLINE) 50 MG tablet TAKE ONE TABLET BY MOUTH THREE TIMES DAILY   insulin glargine (LANTUS SOLOSTAR) 100 UNIT/ML Solostar Pen Inject 58 Units into the skin daily.   Misc. Devices MISC 2 L of oxygen via nasal cannula.  Diagnosis-chronic respiratory failure with hypoxia   nystatin ointment (MYCOSTATIN) Apply 1 application topically 2 (two) times daily.   pregabalin (LYRICA) 75 MG capsule TAKE ONE CAPSULE BY MOUTH TWICE DAILY   spironolactone (ALDACTONE) 25 MG tablet Take 1 tablet (25 mg total) by mouth daily.   VICTOZA 18 MG/3ML SOPN INJECT  1.8 MG into THE SKIN ONCE DAILY AS DIRECTED   potassium chloride SA (KLOR-CON) 20 MEQ tablet Take 1 tablet (20 mEq total) by mouth daily.   Torsemide 40 MG TABS Take 40 mg by mouth daily for 5 days, THEN 40 mg every other day. (Patient taking differently: Take 19m by mouth every other day)   No facility-administered encounter medications on file as of 09/14/2021.     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   Qualifier: Diagnosis of  By: WJorene Minors Scott     Hypertension    MRSA (methicillin resistant Staphylococcus aureus)    Trichomonas    VAGINITIS, BACTERIAL, RECURRENT 08/15/2007   Qualifier: Diagnosis of  By: RRadene OuMD, [place]     Past Surgical History:  Procedure Laterality Date   BREAST BIOPSY Left 2018   CESAREAN SECTION     breech    Family History  Problem Relation Age of Onset   Colon cancer Mother    Liver disease Sister    Other Neg Hx    Breast cancer Neg Hx    Esophageal cancer Neg Hx    Rectal cancer Neg Hx     Social History   Social History Narrative   Lives with friend, GRosalee Kaufmanand nephew.  One daughter.      Review of Systems General: Denies fevers, chills, weight loss CV: Denies chest pain, shortness of breath, palpitations   Physical Exam Vitals with BMI 09/14/2021 09/07/2021 09/07/2021  Height '5\' 4"'  - -  Weight 246 lbs - 249 lbs 5 oz  BMI 449.82- 464.15 Systolic [id number] Diastolic 57 108895  Pulse 90 103 102  Some encounter information is confidential and restricted. Go to Review Flowsheets activity to see all data.    General:  No acute distress,  Alert and oriented, Non-Toxic, Normal speech and affect Chest:  upper airway congestion, no wheezing CV:  RRR Breast: Significant macromastia, no masses palpable on physical exam.  Sternal notch to nipple 41 cm on the right and 42 cmon the left.  Base width is 22 cm on the right and 23 cm on the left.  Nipple to fold is 18 cm bilaterally.  Assessment/Plan Patient may be a candidate for breast reduction in the future.  I would like her hemoglobin A1c to be better controlled.  In addition I want to check with her PCP regarding any recommended preoperative clearance.  DLennice Sites2/27/2023, 4:27 PM

## 2021-09-15 ENCOUNTER — Emergency Department: Payer: Medicaid Other

## 2021-09-15 ENCOUNTER — Other Ambulatory Visit: Payer: Self-pay

## 2021-09-15 ENCOUNTER — Ambulatory Visit: Payer: Self-pay | Admitting: *Deleted

## 2021-09-15 ENCOUNTER — Inpatient Hospital Stay
Admission: EM | Admit: 2021-09-15 | Discharge: 2021-09-17 | DRG: 291 | Disposition: A | Discharge status: Home / Self Care | Payer: Medicaid Other | Attending: Internal Medicine | Admitting: Internal Medicine

## 2021-09-15 DIAGNOSIS — Z888 Allergy status to other drugs, medicaments and biological substances status: Secondary | ICD-10-CM

## 2021-09-15 DIAGNOSIS — I5032 Chronic diastolic (congestive) heart failure: Secondary | ICD-10-CM | POA: Diagnosis not present

## 2021-09-15 DIAGNOSIS — B182 Chronic viral hepatitis C: Secondary | ICD-10-CM | POA: Diagnosis present

## 2021-09-15 DIAGNOSIS — E785 Hyperlipidemia, unspecified: Secondary | ICD-10-CM | POA: Diagnosis present

## 2021-09-15 DIAGNOSIS — E0842 Diabetes mellitus due to underlying condition with diabetic polyneuropathy: Secondary | ICD-10-CM

## 2021-09-15 DIAGNOSIS — J441 Chronic obstructive pulmonary disease with (acute) exacerbation: Secondary | ICD-10-CM | POA: Diagnosis present

## 2021-09-15 DIAGNOSIS — I5033 Acute on chronic diastolic (congestive) heart failure: Secondary | ICD-10-CM | POA: Diagnosis present

## 2021-09-15 DIAGNOSIS — I509 Heart failure, unspecified: Secondary | ICD-10-CM

## 2021-09-15 DIAGNOSIS — Z8619 Personal history of other infectious and parasitic diseases: Secondary | ICD-10-CM

## 2021-09-15 DIAGNOSIS — N183 Chronic kidney disease, stage 3 unspecified: Secondary | ICD-10-CM

## 2021-09-15 DIAGNOSIS — R0609 Other forms of dyspnea: Secondary | ICD-10-CM

## 2021-09-15 DIAGNOSIS — D638 Anemia in other chronic diseases classified elsewhere: Secondary | ICD-10-CM

## 2021-09-15 DIAGNOSIS — E1149 Type 2 diabetes mellitus with other diabetic neurological complication: Secondary | ICD-10-CM

## 2021-09-15 DIAGNOSIS — Z6841 Body Mass Index (BMI) 40.0 and over, adult: Secondary | ICD-10-CM | POA: Diagnosis not present

## 2021-09-15 DIAGNOSIS — E1122 Type 2 diabetes mellitus with diabetic chronic kidney disease: Secondary | ICD-10-CM | POA: Diagnosis present

## 2021-09-15 DIAGNOSIS — Z20822 Contact with and (suspected) exposure to covid-19: Secondary | ICD-10-CM | POA: Diagnosis present

## 2021-09-15 DIAGNOSIS — R0602 Shortness of breath: Secondary | ICD-10-CM | POA: Diagnosis not present

## 2021-09-15 DIAGNOSIS — E119 Type 2 diabetes mellitus without complications: Secondary | ICD-10-CM | POA: Diagnosis not present

## 2021-09-15 DIAGNOSIS — E11311 Type 2 diabetes mellitus with unspecified diabetic retinopathy with macular edema: Secondary | ICD-10-CM

## 2021-09-15 DIAGNOSIS — J449 Chronic obstructive pulmonary disease, unspecified: Secondary | ICD-10-CM | POA: Diagnosis present

## 2021-09-15 DIAGNOSIS — D649 Anemia, unspecified: Secondary | ICD-10-CM | POA: Diagnosis present

## 2021-09-15 DIAGNOSIS — G4733 Obstructive sleep apnea (adult) (pediatric): Secondary | ICD-10-CM | POA: Diagnosis present

## 2021-09-15 DIAGNOSIS — E1140 Type 2 diabetes mellitus with diabetic neuropathy, unspecified: Secondary | ICD-10-CM | POA: Diagnosis present

## 2021-09-15 DIAGNOSIS — N1832 Chronic kidney disease, stage 3b: Secondary | ICD-10-CM | POA: Diagnosis not present

## 2021-09-15 DIAGNOSIS — E1169 Type 2 diabetes mellitus with other specified complication: Secondary | ICD-10-CM

## 2021-09-15 DIAGNOSIS — R079 Chest pain, unspecified: Secondary | ICD-10-CM

## 2021-09-15 DIAGNOSIS — Z794 Long term (current) use of insulin: Secondary | ICD-10-CM

## 2021-09-15 DIAGNOSIS — I110 Hypertensive heart disease with heart failure: Secondary | ICD-10-CM | POA: Diagnosis not present

## 2021-09-15 DIAGNOSIS — I130 Hypertensive heart and chronic kidney disease with heart failure and stage 1 through stage 4 chronic kidney disease, or unspecified chronic kidney disease: Secondary | ICD-10-CM | POA: Diagnosis not present

## 2021-09-15 DIAGNOSIS — I5031 Acute diastolic (congestive) heart failure: Secondary | ICD-10-CM | POA: Diagnosis not present

## 2021-09-15 DIAGNOSIS — R0902 Hypoxemia: Secondary | ICD-10-CM

## 2021-09-15 DIAGNOSIS — I5030 Unspecified diastolic (congestive) heart failure: Secondary | ICD-10-CM | POA: Diagnosis present

## 2021-09-15 DIAGNOSIS — J9601 Acute respiratory failure with hypoxia: Secondary | ICD-10-CM | POA: Diagnosis not present

## 2021-09-15 DIAGNOSIS — D631 Anemia in chronic kidney disease: Secondary | ICD-10-CM | POA: Diagnosis not present

## 2021-09-15 LAB — BASIC METABOLIC PANEL
Anion gap: 9 (ref 5–15)
BUN: 53 mg/dL — ABNORMAL HIGH (ref 6–20)
CO2: 16 mmol/L — ABNORMAL LOW (ref 22–32)
Calcium: 8.9 mg/dL (ref 8.9–10.3)
Chloride: 113 mmol/L — ABNORMAL HIGH (ref 98–111)
Creatinine, Ser: 1.81 mg/dL — ABNORMAL HIGH (ref 0.44–1.00)
GFR, Estimated: 32 mL/min — ABNORMAL LOW (ref >60)
Glucose, Bld: 203 mg/dL — ABNORMAL HIGH (ref 70–99)
Potassium: 4.7 mmol/L (ref 3.5–5.1)
Sodium: 138 mmol/L (ref 135–145)

## 2021-09-15 LAB — CREATININE, SERUM
Creatinine, Ser: 1.81 mg/dL — ABNORMAL HIGH (ref 0.44–1.00)
GFR, Estimated: 32 mL/min — ABNORMAL LOW (ref >60)

## 2021-09-15 LAB — CBC
HCT: 27 % — ABNORMAL LOW (ref 36.0–46.0)
Hemoglobin: 8 g/dL — ABNORMAL LOW (ref 12.0–15.0)
MCH: 25.8 pg — ABNORMAL LOW (ref 26.0–34.0)
MCHC: 29.6 g/dL — ABNORMAL LOW (ref 30.0–36.0)
MCV: 87.1 fL (ref 80.0–100.0)
Platelets: 166 10*3/uL (ref 150–400)
RBC: 3.1 MIL/uL — ABNORMAL LOW (ref 3.87–5.11)
RDW: 14 % (ref 11.5–15.5)
WBC: 4.1 10*3/uL (ref 4.0–10.5)
nRBC: 0 % (ref 0.0–0.2)

## 2021-09-15 LAB — RESP PANEL BY RT-PCR (FLU A&B, COVID) ARPGX2
Influenza A by PCR: NEGATIVE
Influenza B by PCR: NEGATIVE
SARS Coronavirus 2 by RT PCR: NEGATIVE

## 2021-09-15 LAB — SODIUM, URINE, RANDOM: Sodium, Ur: 82 mmol/L

## 2021-09-15 LAB — CREATININE, URINE, RANDOM: Creatinine, Urine: 47 mg/dL

## 2021-09-15 LAB — CBG MONITORING, ED: Glucose-Capillary: 141 mg/dL — ABNORMAL HIGH (ref 70–99)

## 2021-09-15 LAB — TROPONIN I (HIGH SENSITIVITY)
Troponin I (High Sensitivity): 5 ng/L (ref <18)
Troponin I (High Sensitivity): 4 ng/L (ref <18)

## 2021-09-15 LAB — GLUCOSE, CAPILLARY: Glucose-Capillary: 160 mg/dL — ABNORMAL HIGH (ref 70–99)

## 2021-09-15 LAB — BRAIN NATRIURETIC PEPTIDE: B Natriuretic Peptide: 229.1 pg/mL — ABNORMAL HIGH (ref 0.0–100.0)

## 2021-09-15 MED ORDER — AMLODIPINE BESYLATE 10 MG PO TABS
10.0000 mg | ORAL_TABLET | Freq: Every day | ORAL | Status: DC
  Administered 2021-09-15: 10 mg via ORAL
  Filled 2021-09-15: qty 2
  Filled 2021-09-15: qty 1

## 2021-09-15 MED ORDER — MOMETASONE FURO-FORMOTEROL FUM 100-5 MCG/ACT IN AERO
2.0000 | INHALATION_SPRAY | Freq: Two times a day (BID) | RESPIRATORY_TRACT | Status: DC
  Administered 2021-09-16 – 2021-09-17 (×3): 2 via RESPIRATORY_TRACT
  Filled 2021-09-15: qty 8.8

## 2021-09-15 MED ORDER — INSULIN DETEMIR 100 UNIT/ML ~~LOC~~ SOLN
25.0000 [IU] | Freq: Every day | SUBCUTANEOUS | Status: DC
  Administered 2021-09-16 (×2): 25 [IU] via SUBCUTANEOUS
  Filled 2021-09-15 (×3): qty 0.25

## 2021-09-15 MED ORDER — HEPARIN SODIUM (PORCINE) 5000 UNIT/ML IJ SOLN
5000.0000 [IU] | Freq: Two times a day (BID) | INTRAMUSCULAR | Status: DC
  Administered 2021-09-15 – 2021-09-16 (×2): 5000 [IU] via SUBCUTANEOUS
  Filled 2021-09-15 (×3): qty 1

## 2021-09-15 MED ORDER — SODIUM CHLORIDE 0.9% FLUSH
3.0000 mL | Freq: Two times a day (BID) | INTRAVENOUS | Status: DC
  Administered 2021-09-15 – 2021-09-17 (×5): 3 mL via INTRAVENOUS

## 2021-09-15 MED ORDER — SODIUM CHLORIDE 0.9% FLUSH: 3.0000 mL | INTRAVENOUS | Status: DC | PRN

## 2021-09-15 MED ORDER — ATORVASTATIN CALCIUM 20 MG PO TABS
40.0000 mg | ORAL_TABLET | Freq: Every morning | ORAL | Status: DC
Start: 2021-09-15 — End: 2021-09-17
  Administered 2021-09-15 – 2021-09-17 (×3): 40 mg via ORAL
  Filled 2021-09-15 (×3): qty 2

## 2021-09-15 MED ORDER — ALBUTEROL SULFATE HFA 108 (90 BASE) MCG/ACT IN AERS: 1.0000 | INHALATION_SPRAY | RESPIRATORY_TRACT | Status: DC | PRN

## 2021-09-15 MED ORDER — FUROSEMIDE 10 MG/ML IJ SOLN
40.0000 mg | Freq: Three times a day (TID) | INTRAMUSCULAR | Status: AC
  Administered 2021-09-15 – 2021-09-16 (×2): 40 mg via INTRAVENOUS
  Filled 2021-09-15 (×3): qty 4

## 2021-09-15 MED ORDER — CARVEDILOL 12.5 MG PO TABS
12.5000 mg | ORAL_TABLET | Freq: Two times a day (BID) | ORAL | Status: DC
  Administered 2021-09-15: 12.5 mg via ORAL
  Filled 2021-09-15: qty 1
  Filled 2021-09-15: qty 2

## 2021-09-15 MED ORDER — SODIUM CHLORIDE 0.9 % IV SOLN: 250.0000 mL | INTRAVENOUS | Status: DC | PRN

## 2021-09-15 MED ORDER — CARVEDILOL 12.5 MG PO TABS
12.5000 mg | ORAL_TABLET | Freq: Two times a day (BID) | ORAL | Status: DC
Start: 2021-09-16 — End: 2021-09-17
  Administered 2021-09-16 – 2021-09-17 (×3): 12.5 mg via ORAL
  Filled 2021-09-15 (×3): qty 1

## 2021-09-15 MED ORDER — ALBUTEROL SULFATE (2.5 MG/3ML) 0.083% IN NEBU: 2.5000 mg | INHALATION_SOLUTION | RESPIRATORY_TRACT | Status: DC | PRN

## 2021-09-15 MED ORDER — FERROUS SULFATE 325 (65 FE) MG PO TABS
325.0000 mg | ORAL_TABLET | Freq: Every day | ORAL | Status: DC
  Administered 2021-09-16 – 2021-09-17 (×2): 325 mg via ORAL
  Filled 2021-09-15 (×2): qty 1

## 2021-09-15 MED ORDER — PREGABALIN 75 MG PO CAPS
75.0000 mg | ORAL_CAPSULE | Freq: Two times a day (BID) | ORAL | Status: DC
  Administered 2021-09-15 – 2021-09-17 (×4): 75 mg via ORAL
  Filled 2021-09-15 (×4): qty 1

## 2021-09-15 MED ORDER — ENOXAPARIN SODIUM 40 MG/0.4ML IJ SOSY: 40.0000 mg | PREFILLED_SYRINGE | INTRAMUSCULAR | Status: DC

## 2021-09-15 MED ORDER — FUROSEMIDE 10 MG/ML IJ SOLN
60.0000 mg | Freq: Once | INTRAMUSCULAR | Status: AC
  Administered 2021-09-15: 60 mg via INTRAVENOUS
  Filled 2021-09-15: qty 8

## 2021-09-15 MED ORDER — SPIRONOLACTONE 25 MG PO TABS
25.0000 mg | ORAL_TABLET | Freq: Every day | ORAL | Status: DC
  Administered 2021-09-16 – 2021-09-17 (×2): 25 mg via ORAL
  Filled 2021-09-15 (×2): qty 1

## 2021-09-15 MED ORDER — INSULIN ASPART 100 UNIT/ML IJ SOLN
0.0000 [IU] | Freq: Three times a day (TID) | INTRAMUSCULAR | Status: DC
  Administered 2021-09-15 – 2021-09-16 (×2): 2 [IU] via SUBCUTANEOUS
  Administered 2021-09-16: 3 [IU] via SUBCUTANEOUS
  Administered 2021-09-17: 2 [IU] via SUBCUTANEOUS
  Administered 2021-09-17: 3 [IU] via SUBCUTANEOUS
  Filled 2021-09-15 (×3): qty 1

## 2021-09-15 MED ORDER — ONDANSETRON HCL 4 MG/2ML IJ SOLN: 4.0000 mg | Freq: Four times a day (QID) | INTRAMUSCULAR | Status: DC | PRN

## 2021-09-15 MED ORDER — HYDRALAZINE HCL 50 MG PO TABS
50.0000 mg | ORAL_TABLET | Freq: Three times a day (TID) | ORAL | Status: DC
  Administered 2021-09-15 – 2021-09-17 (×6): 50 mg via ORAL
  Filled 2021-09-15 (×6): qty 1

## 2021-09-15 MED ORDER — INSULIN ASPART 100 UNIT/ML IJ SOLN
4.0000 [IU] | Freq: Three times a day (TID) | INTRAMUSCULAR | Status: DC
  Administered 2021-09-15 – 2021-09-17 (×6): 4 [IU] via SUBCUTANEOUS
  Filled 2021-09-15 (×5): qty 1

## 2021-09-15 MED ORDER — ACETAMINOPHEN 325 MG PO TABS
650.0000 mg | ORAL_TABLET | ORAL | Status: DC | PRN
  Administered 2021-09-16: 650 mg via ORAL
  Filled 2021-09-15: qty 2

## 2021-09-15 NOTE — Assessment & Plan Note (Addendum)
Patient had a pulse ox of 87% on presentation was put on 2 L of oxygen.  Patient able to come off oxygen completely and held her sats with ambulation upon walking around.

## 2021-09-15 NOTE — ED Notes (Addendum)
Secure chat message sent to inpatient RN Quintin Alto at this time

## 2021-09-15 NOTE — ED Notes (Signed)
Secure chat message sent to inpatient RN Marcie Bal Ward at this time

## 2021-09-15 NOTE — ED Triage Notes (Signed)
Pt comes with c/o SOB that started 3 weeks ago. Today the CP started and is mid sternal. Pt has labored breathing and accessory muscle use noted. Pt states 7/10 pain.

## 2021-09-15 NOTE — Assessment & Plan Note (Addendum)
EF 60 to 65%.  Patient on Coreg torsemide and spironolactone.

## 2021-09-15 NOTE — H&P (Signed)
HISTORY AND PHYSICAL  Patient: [name] [name] 61 y.o. female MRN: [id number]  Today 09/15/21 is hospital day 0 after admission on 09/15/2021 12:44 PM  [place] COURSE: Patient with history of diastolic CHF, COPD, anemia of chronic disease presents to the ED with 3 weeks of progressive worsening shortness of breath, particularly worse over the past 4 to 5 days, dyspnea on exertion over the past 4 to 5 days as well as increased lower extremity edema.  Requiring O2 in the ED, chest x-ray showed mild interstitial pulmonary edema which was new, BNP was 229, hemoglobin 8.0 down from baseline 8.4-9.9.  EKG/troponins not concerning for ACS and patient denied chest pain/reported feeling improvement on O2.  Patient received Lasix in ED.  Procedures and Significant Results:  In ED: SaO2 84% on RA, CXR (+)New mild interstitial pulmonary edema, (+)rales and LE edema on exam. BNP 229. Hgb 8.0 (down from baseline 8.4-9.9).   Consultants:  none    SUBJECTIVE:  Patient seen and examined in the ED, she was sleeping when I walked in, rouses easily.  On nasal cannula O2, showing some increased work of breathing but able to speak in complete sentences.  She confirms above history regarding shortness of breath over the past 3 weeks or so, worsening over the weekend with associated lower extremity edema.  She denies any nonadherence to home medications, including inhalers and diuretics.  She denies any noncompliance with low-salt diet.  She confirms no home O2.  She thinks she may have had a bit of a cold a few weeks ago with some sinus pressure, but no cough/fever or other respiratory issues.  At this point, shortness of breath and mild lower extremity edema are her only complaints, no other pain, no chest pain.     ASSESSMENT & PLAN  Acute diastolic HF (heart failure) (HCC) Continue home torsemide and adding IV lasix for diuresis Continue home carvedilol, amlodipine, spironolactone,  hydralazine Hold off on ACE/ARB d/t AKI Repeat Echo (last done 03/2021 LVEF 65-70%, moderate diastolic dysfunction) Strict I&O  Symptomatic anemia No s/s bleeding esp GI bleeding  Possible fluid overload creating hemodilution but won't ignore BUN elev Trend AM CBC, obtain ferritin/iron, retic ct If dropping, w/u for GI bleed / consider consult GI Pt is amenable to transfusion if needed   Acute respiratory failure with hypoxia ([place]) Continue O2 via [place], wean as tolerated / as fluid overload improves CPAP qhs prn given hx OSA  COPD without exacerbation (HCC) SOB unlikely d/t COPD exacerbation as dCHF more likely Optimize inhaler, switch from ICS to ICS-LABA  Chronic kidney disease, stage 3b ([place]) Close monitor renal fxn w/ diuresis Renal lytes to calculate FeNa/FeUrea  Insulin dependent type 2 diabetes mellitus (HCC) SSI and basal insulin A1C unlikely to be accurate in setting of anemia, will defer for now   Diabetic neuropathy ([place]) Continue home Lyrica, PDMP reviewed   VTE Ppx: heparin CODE STATUS   Code Status: Full Code Admitted from: home Expected Dispo: home Barriers to discharge: continued medical w/u and treatment  Family communication: none at this time, pt declined family phone call               Past Medical History:  Diagnosis Date   Anemia    CHF (congestive heart failure) ([place])    Chronic hepatitis C without hepatic coma ([place]) 11/09/2016   Diabetes mellitus    Fibroids    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 She has never used smokeless tobacco. She reports that she does not currently use drugs after having used the following drugs: Cocaine and Marijuana. She reports that she does not drink alcohol.  Allergies:  Allergies  Allergen Reactions   Lisinopril Swelling    (Not in a hospital admission)   Results for orders placed or performed during the hospital encounter of 09/15/21 (from the past 48 hour(s))  Basic metabolic panel     Status: Abnormal   Collection Time: 09/15/21 12:55 PM  Result Value Ref Range   Sodium 138 135 - 145 mmol/L   Potassium 4.7 3.5 - 5.1 mmol/L   Chloride 113 (H) 98 - 111 mmol/L   CO2 16 (L) 22 - 32 mmol/L   Glucose, Bld 203 (H) 70 - 99 mg/dL    Comment: Glucose reference range applies only to samples taken after fasting for at least 8 hours.   BUN 53 (H) 6 - 20 mg/dL   Creatinine, Ser 1.81 (H) 0.44 - 1.00 mg/dL   Calcium 8.9 8.9 - 10.3 mg/dL   GFR, Estimated 32 (L) >60 mL/min    Comment: (NOTE) Calculated using the CKD-EPI Creatinine Equation (2021)    Anion gap 9 5 - 15    Comment: Performed at [facility], [address] [place], [place] [address]  CBC     Status: Abnormal   Collection Time: 09/15/21 12:55 PM  Result Value Ref Range   WBC 4.1 4.0 - 10.5 K/uL   RBC 3.10 (L) 3.87 - 5.11 MIL/uL   Hemoglobin 8.0 (L) 12.0 - 15.0 g/dL   HCT 27.0 (L) 36.0 - 46.0 %   MCV 87.1 80.0 - 100.0 fL   MCH 25.8 (L) 26.0 - 34.0 pg   MCHC 29.6 (L) 30.0 - 36.0 g/dL   RDW 14.0 11.5 - 15.5 %   Platelets 166 150 - 400 K/uL   nRBC 0.0 0.0 - 0.2 %    Comment: Performed at [facility], [address] [place], [place] [address]  Troponin I (High Sensitivity)     Status: None   Collection Time: 09/15/21 12:55 PM  Result Value Ref Range   Troponin I (High Sensitivity) 5 <18 ng/L    Comment: (NOTE) Elevated high  sensitivity troponin I (hsTnI) values and significant  changes across serial measurements may suggest ACS but many other  chronic and acute conditions are known to elevate hsTnI results.  Refer to the "Links" section for chest pain algorithms and additional  guidance. Performed at [facility], [address] [place], [place] [address]   Brain natriuretic peptide     Status: Abnormal   Collection Time: 09/15/21 12:55 PM  Result Value Ref Range   B Natriuretic Peptide 229.1 (H) 0.0 -  100.0 pg/mL    Comment: Performed at [facility], [address] [place], [place] [address]  Resp Panel by RT-PCR (Flu A&B, Covid) Nasopharyngeal Swab     Status: None   Collection Time: 09/15/21 12:55 PM   Specimen: Nasopharyngeal Swab; Nasopharyngeal(NP) swabs in vial transport medium  Result Value Ref Range   SARS Coronavirus 2 by RT PCR NEGATIVE NEGATIVE    Comment: (NOTE) SARS-CoV-2 target nucleic acids are NOT DETECTED.  The SARS-CoV-2 RNA is generally detectable in upper respiratory specimens during the acute phase of infection. The lowest concentration of SARS-CoV-2 viral copies this assay can detect is 138 copies/mL. A negative result does not preclude SARS-Cov-2 infection and should not be used as the sole basis for treatment or other patient management decisions. A negative result may occur with  improper specimen collection/handling, submission of specimen other than nasopharyngeal swab, presence of viral mutation(s) within the areas targeted by this assay, and inadequate number of viral copies(<138 copies/mL). A negative result must be combined with clinical observations, patient history, and epidemiological information. The expected result is Negative.  Fact Sheet for Patients:  EntrepreneurPulse.com.au  Fact Sheet for Healthcare Providers:  IncredibleEmployment.be  This test is no t yet approved or cleared by the [place]  FDA and  has been authorized for detection and/or diagnosis of SARS-CoV-2 by FDA under an Emergency Use Authorization (EUA). This EUA will remain  in effect (meaning this test can be used) for the duration of the COVID-19 declaration under Section 564(b)(1) of the Act, 21 U.S.C.section 360bbb-3(b)(1), unless the authorization is terminated  or revoked sooner.       Influenza A by PCR NEGATIVE NEGATIVE   Influenza B by PCR NEGATIVE NEGATIVE    Comment: (NOTE) The Xpert Xpress SARS-CoV-2/FLU/RSV plus assay is intended as an aid in the diagnosis of influenza from Nasopharyngeal swab specimens and should not be used as a sole basis for treatment. Nasal washings and aspirates are unacceptable for Xpert Xpress SARS-CoV-2/FLU/RSV testing.  Fact Sheet for Patients: EntrepreneurPulse.com.au  Fact Sheet for Healthcare Providers: IncredibleEmployment.be  This test is not yet approved or cleared by the [place] FDA and has been authorized for detection and/or diagnosis of SARS-CoV-2 by FDA under an Emergency Use Authorization (EUA). This EUA will remain in effect (meaning this test can be used) for the duration of the COVID-19 declaration under Section 564(b)(1) of the Act, 21 U.S.C. section 360bbb-3(b)(1), unless the authorization is terminated or revoked.  Performed at [facility], [address], [place], [place] [address]    DG Chest [place] 1 View  Result Date: 09/15/2021 CLINICAL DATA:  Shortness of breath for the past 3 weeks. Chest pain. EXAM: PORTABLE CHEST 1 VIEW COMPARISON:  Chest x-ray dated May 05, 2021. FINDINGS: Unchanged cardiomegaly. New mild pulmonary vascular congestion with increased interstitial markings at the lung bases. No focal consolidation, pleural effusion, or pneumothorax. No acute osseous abnormality. IMPRESSION: 1. New mild interstitial pulmonary edema. Electronically Signed   By: Titus Dubin M.D.   On:  09/15/2021 13:16    Review of Systems  Constitutional:  Negative for activity change and fatigue.  HENT:  Positive for sinus pressure. Negative for rhinorrhea, sore throat and trouble swallowing.   Respiratory:  Positive for chest tightness and shortness of breath. Negative for cough and choking.   Cardiovascular:  Positive for leg swelling. Negative for chest pain and palpitations.  Gastrointestinal:  Negative for abdominal distention, abdominal pain, anal bleeding, blood in stool, constipation  and diarrhea.  Endocrine: Negative for cold intolerance and heat intolerance.  Genitourinary:  Negative for difficulty urinating.  Musculoskeletal:  Negative for arthralgias.  Skin:  Negative for color change.  Neurological:  Negative for dizziness and syncope.  Hematological:  Negative for adenopathy. Does not bruise/bleed easily.  Psychiatric/Behavioral:  Negative for dysphoric mood. The patient is not nervous/anxious.    Blood pressure (!) 143/74, pulse 93, temperature 98.5 F (36.9 C), resp. rate 18, last menstrual period 04/01/2010, SpO2 94 %. Physical Exam Constitutional:      Appearance: She is well-developed. She is obese. She is ill-appearing. She is not toxic-appearing or diaphoretic.  Eyes:     Extraocular Movements: Extraocular movements intact.  Neck:     Thyroid: No thyromegaly.  Cardiovascular:     Rate and Rhythm: Normal rate and regular rhythm.  Pulmonary:     Effort: Accessory muscle usage present. No respiratory distress.     Breath sounds: No stridor. Examination of the right-middle field reveals rales. Examination of the left-middle field reveals rales. Examination of the right-lower field reveals decreased breath sounds. Examination of the left-lower field reveals decreased breath sounds. Decreased breath sounds and rales present.  Musculoskeletal:     Cervical back: Neck supple.     Right lower leg: Edema (2) present.     Left lower leg: Edema (+2) present.  Skin:     General: Skin is warm and dry.  Neurological:     General: No focal deficit present.     Mental Status: She is alert and oriented to person, place, and time.  Psychiatric:        Mood and Affect: Mood normal.        Behavior: Behavior normal.      Emeterio Reeve, DO 09/15/2021, 2:32 PM

## 2021-09-15 NOTE — Telephone Encounter (Signed)
°  Chief Complaint: shortness of breath with minimal exertion  Has COPD Symptoms: Has to stop and rest after walking a short distance due to shortness of breath Frequency: For last 2 weeks. Pertinent Negatives: Patient denies chest pain. Disposition: [] ED /[] Urgent Care (no appt availability in office) / [] Appointment(In office/virtual)/ []  [facility]/ [] Home Care/ [] Refused Recommended Disposition /[x] [facility]/ []  Follow-up with PCP Additional Notes: Her sister is going to take her to [facility] because they are taking walk ins while the [facility] is not running.  Staff working at [employer].

## 2021-09-15 NOTE — ED Notes (Signed)
Pt up to bedside toilet. Pt urinated.

## 2021-09-15 NOTE — ED Notes (Signed)
Pt confirms history of COPD and CHF; reports SOB x3 weeks; reports fell at church the other day due to SOB and dizziness; bilateral ankle swelling noted; pitting 1+; A&Ox4; reports had sleep study completed recently and was supposed to be told whether she would get approved for home oxygen but never heard back from office; currently 96% on 2L; pt SOB upon exacerbation; pt tearful but calm.

## 2021-09-15 NOTE — Assessment & Plan Note (Deleted)
Close monitor renal fxn w/ diuresis Renal lytes to calculate FeNa/FeUrea

## 2021-09-15 NOTE — Assessment & Plan Note (Addendum)
Continue Lyrica. 

## 2021-09-15 NOTE — ED Notes (Signed)
Stretcher locked low. Rail up. Call bell within reach. Hat in toilet and pt educated about process to measure I&Os.

## 2021-09-15 NOTE — Assessment & Plan Note (Deleted)
No s/s bleeding esp GI bleeding  Possible fluid overload creating hemodilution but won't ignore BUN elev Trend AM CBC, obtain ferritin/iron, retic ct If dropping, w/u for GI bleed / consider consult GI Pt is amenable to transfusion if needed

## 2021-09-15 NOTE — Assessment & Plan Note (Deleted)
SOB unlikely d/t COPD exacerbation as dCHF more likely Optimize inhaler, switch from ICS to ICS-LABA

## 2021-09-15 NOTE — Assessment & Plan Note (Deleted)
SSI and basal insulin A1C unlikely to be accurate in setting of anemia, will defer for now

## 2021-09-15 NOTE — Telephone Encounter (Signed)
Reason for Disposition  [1] MILD difficulty breathing (e.g., minimal/no SOB at rest, SOB with walking, pulse <100) AND [2] NEW-onset or WORSE than normal  Answer Assessment - Initial Assessment Questions 1. RESPIRATORY STATUS: "Describe your breathing?" (e.g., wheezing, shortness of breath, unable to speak, severe coughing)      C/o shortness of breath.   Over the weekend I feel so out of breath.  I go 5 feet and I feel like I'm going to faint because I can't get my breath.   I was to have surgery on Monday for plastic surgery.   The dr. Geraldine Solar me to see my PCP.   My surgery was cancelled.    2. ONSET: "When did this breathing problem begin?"      2 weeks ago.    I can't walk far due to shortness of breath.   3. PATTERN "Does the difficult breathing come and go, or has it been constant since it started?"      *No Answer* 4. SEVERITY: "How bad is your breathing?" (e.g., mild, moderate, severe)    - MILD: No SOB at rest, mild SOB with walking, speaks normally in sentences, can lie down, no retractions, pulse < 100.    - MODERATE: SOB at rest, SOB with minimal exertion and prefers to sit, cannot lie down flat, speaks in phrases, mild retractions, audible wheezing, pulse 100-120.    - SEVERE: Very SOB at rest, speaks in single words, struggling to breathe, sitting hunched forward, retractions, pulse > 120      Severe with exertion. The ambulance came a week ago or so because I could not wake up.   They tried to wake me up and took me to the hospital.   Last thing I remember is I took my insulin.    I want to go over my insulin.   5. RECURRENT SYMPTOM: "Have you had difficulty breathing before?" If Yes, ask: "When was the last time?" and "What happened that time?"      *No Answer* 6. CARDIAC HISTORY: "Do you have any history of heart disease?" (e.g., heart attack, angina, bypass surgery, angioplasty)      *No Answer* 7. LUNG HISTORY: "Do you have any history of lung disease?"  (e.g., pulmonary embolus,  asthma, emphysema)     COPD 8. CAUSE: "What do you think is causing the breathing problem?"      *No Answer* 9. OTHER SYMPTOMS: "Do you have any other symptoms? (e.g., dizziness, runny nose, cough, chest pain, fever)     *No Answer* 10. O2 SATURATION MONITOR:  "Do you use an oxygen saturation monitor (pulse oximeter) at home?" If Yes, "What is your reading (oxygen level) today?" "What is your usual oxygen saturation reading?" (e.g., 95%)       *No Answer* 11. PREGNANCY: "Is there any chance you are pregnant?" "When was your last menstrual period?"       *No Answer* 12. TRAVEL: "Have you traveled out of the country in the last month?" (e.g., travel history, exposures)       *No Answer*  Protocols used: Breathing Difficulty-A-AH

## 2021-09-15 NOTE — ED Provider Notes (Signed)
[facility] Provider Note    Event Date/Time   First MD Initiated Contact with Patient 09/15/21 1248     (approximate)  History   Chief Complaint: Chest Pain and Shortness of Breath  HPI  [name] [name] is a 61 y.o. female with a past medical history of CHF, diabetes, anemia, presents emergency department for shortness of breath.  According to the patient for the past 3 weeks or so she has been experiencing worsening shortness of breath.  Patient denies any chest pains or states tightness.  Denies any home oxygen use.  Arrives with an 84% room air saturation with a good waveform.  Patient does have lower extremity which she states has been ongoing for the past 1 week.  Patient states she has been taking all of her medications as prescribed.  Denies any recent fever or significant cough.  Physical Exam   Triage Vital Signs: ED Triage Vitals  Enc Vitals Group     BP 09/15/21 1243 (!) 158/83     Pulse Rate 09/15/21 1243 97     Resp 09/15/21 1243 (!) 25     Temp 09/15/21 1243 98.5 F (36.9 C)     Temp src --      SpO2 09/15/21 1243 98 %     Weight --      Height --      Head Circumference --      Peak Flow --      Pain Score 09/15/21 1241 7     Pain Loc --      Pain Edu? --      Excl. in [place]? --     Most recent vital signs: Vitals:   09/15/21 1243  BP: (!) 158/83  Pulse: 97  Resp: (!) 25  Temp: 98.5 F (36.9 C)  SpO2: 98%    General: Awake, no distress.  CV:  Good peripheral perfusion.  Regular rate and rhythm  Resp:  Mild tachypnea.  Diminished breath sounds bilaterally without obvious wheeze rales or rhonchi Abd:  No distention.  Soft, nontender.  No rebound or guarding. Other:  2+ lower extremity edema bilaterally.   ED Results / Procedures / Treatments   EKG  EKG viewed and interpreted by myself shows a normal sinus rhythm at 99 bpm with a narrow QRS, normal axis, normal intervals, nonspecific but no concerning ST  changes.  RADIOLOGY  I personally viewed the chest x-ray images does appear to have some haziness in the mid to lower lung fields bilaterally likely representing pulmonary edema. Radiologist read the x-ray as interstitial pulmonary edema.   MEDICATIONS ORDERED IN ED: Medications - No data to display   IMPRESSION / MDM / [place] / ED COURSE  I reviewed the triage vital signs and the nursing notes.  Patient presents to the emergency department for shortness of breath.  Patient has mild tachypnea on examination currently satting 84% on room air.  Placed on 2 L nasal cannula now satting in the mid 90s.  Patient has diminished breath sounds bilaterally.  2+ lower extremity edema.  Differential would include ACS such as recent NSTEMI, CHF exacerbation, pulmonary edema, pneumonia, COVID, influenza.  We will check labs including cardiac enzymes and a BNP.  We will obtain a chest x-ray.  We will continue to closely monitor while awaiting results.  Given the patient's hypoxia on room air with no baseline O2 requirement patient will require admission to the hospital once her emergency department work-up  is been completed.  Lab work shows anemia which is somewhat downtrending from her chronic anemia.  This could be contributing to her heart failure symptoms such as high-output heart failure.  Patient's chest x-ray appears to show pulmonary edema/interstitial edema.  Given the patient's hypoxia we will start the patient on IV Lasix.  Reassuringly patient's troponin is negative.  Normal white blood cell count.  Reassuring chemistry, chronic renal insufficiency compared to prior labs.  FINAL CLINICAL IMPRESSION(S) / ED DIAGNOSES   Dyspnea Hypoxia CHF exacerbation   Note:  This document was prepared using Dragon voice recognition software and may include unintentional dictation errors.   Harvest Dark, MD 09/15/21 [phone]

## 2021-09-16 ENCOUNTER — Other Ambulatory Visit: Payer: Self-pay

## 2021-09-16 ENCOUNTER — Inpatient Hospital Stay (HOSPITAL_COMMUNITY)
Admit: 2021-09-16 | Discharge: 2021-09-16 | Disposition: A | Discharge status: Home / Self Care | Payer: Medicaid Other | Attending: Osteopathic Medicine | Admitting: Osteopathic Medicine

## 2021-09-16 ENCOUNTER — Other Ambulatory Visit: Payer: Self-pay | Admitting: Family

## 2021-09-16 ENCOUNTER — Encounter (HOSPITAL_COMMUNITY): Payer: Self-pay

## 2021-09-16 DIAGNOSIS — N1832 Chronic kidney disease, stage 3b: Secondary | ICD-10-CM | POA: Diagnosis not present

## 2021-09-16 DIAGNOSIS — E785 Hyperlipidemia, unspecified: Secondary | ICD-10-CM | POA: Diagnosis not present

## 2021-09-16 DIAGNOSIS — J9601 Acute respiratory failure with hypoxia: Secondary | ICD-10-CM | POA: Diagnosis not present

## 2021-09-16 DIAGNOSIS — J449 Chronic obstructive pulmonary disease, unspecified: Secondary | ICD-10-CM | POA: Diagnosis not present

## 2021-09-16 DIAGNOSIS — D638 Anemia in other chronic diseases classified elsewhere: Secondary | ICD-10-CM | POA: Diagnosis not present

## 2021-09-16 DIAGNOSIS — E1169 Type 2 diabetes mellitus with other specified complication: Secondary | ICD-10-CM

## 2021-09-16 DIAGNOSIS — E0842 Diabetes mellitus due to underlying condition with diabetic polyneuropathy: Secondary | ICD-10-CM | POA: Diagnosis not present

## 2021-09-16 DIAGNOSIS — I5031 Acute diastolic (congestive) heart failure: Secondary | ICD-10-CM | POA: Diagnosis not present

## 2021-09-16 LAB — CBC
HCT: 25.7 % — ABNORMAL LOW (ref 36.0–46.0)
Hemoglobin: 7.6 g/dL — ABNORMAL LOW (ref 12.0–15.0)
MCH: 25.3 pg — ABNORMAL LOW (ref 26.0–34.0)
MCHC: 29.6 g/dL — ABNORMAL LOW (ref 30.0–36.0)
MCV: 85.7 fL (ref 80.0–100.0)
Platelets: 163 10*3/uL (ref 150–400)
RBC: 3 MIL/uL — ABNORMAL LOW (ref 3.87–5.11)
RDW: 14.2 % (ref 11.5–15.5)
WBC: 3.3 10*3/uL — ABNORMAL LOW (ref 4.0–10.5)
nRBC: 0 % (ref 0.0–0.2)

## 2021-09-16 LAB — ECHOCARDIOGRAM COMPLETE
AR max vel: 2.87 cm2
AV Area VTI: 2.83 cm2
AV Area mean vel: 2.48 cm2
AV Mean grad: 6 mmHg
AV Peak grad: 12.1 mmHg
Ao pk vel: 1.74 m/s
Area-P 1/2: 2.84 cm2
MV VTI: 1.83 cm2
S' Lateral: 2.6 cm
Weight: 3848.35 oz

## 2021-09-16 LAB — IRON AND TIBC
Iron: 37 ug/dL (ref 28–170)
Saturation Ratios: 15 % (ref 10.4–31.8)
TIBC: 252 ug/dL (ref 250–450)
UIBC: 215 ug/dL

## 2021-09-16 LAB — RETICULOCYTES
Immature Retic Fract: 16.5 % — ABNORMAL HIGH (ref 2.3–15.9)
RBC.: 2.95 MIL/uL — ABNORMAL LOW (ref 3.87–5.11)
Retic Count, Absolute: 53.1 10*3/uL (ref 19.0–186.0)
Retic Ct Pct: 1.8 % (ref 0.4–3.1)

## 2021-09-16 LAB — BASIC METABOLIC PANEL
Anion gap: 11 (ref 5–15)
BUN: 53 mg/dL — ABNORMAL HIGH (ref 6–20)
CO2: 18 mmol/L — ABNORMAL LOW (ref 22–32)
Calcium: 9 mg/dL (ref 8.9–10.3)
Chloride: 112 mmol/L — ABNORMAL HIGH (ref 98–111)
Creatinine, Ser: 1.8 mg/dL — ABNORMAL HIGH (ref 0.44–1.00)
GFR, Estimated: 32 mL/min — ABNORMAL LOW (ref >60)
Glucose, Bld: 121 mg/dL — ABNORMAL HIGH (ref 70–99)
Potassium: 4.3 mmol/L (ref 3.5–5.1)
Sodium: 141 mmol/L (ref 135–145)

## 2021-09-16 LAB — UREA NITROGEN, URINE: Urea Nitrogen, Ur: 265 mg/dL

## 2021-09-16 LAB — GLUCOSE, CAPILLARY
Glucose-Capillary: 111 mg/dL — ABNORMAL HIGH (ref 70–99)
Glucose-Capillary: 128 mg/dL — ABNORMAL HIGH (ref 70–99)
Glucose-Capillary: 156 mg/dL — ABNORMAL HIGH (ref 70–99)
Glucose-Capillary: 129 mg/dL — ABNORMAL HIGH (ref 70–99)

## 2021-09-16 LAB — FERRITIN: Ferritin: 210 ng/mL (ref 11–307)

## 2021-09-16 MED ORDER — FUROSEMIDE 10 MG/ML IJ SOLN
40.0000 mg | Freq: Two times a day (BID) | INTRAMUSCULAR | Status: DC
Start: 2021-09-16 — End: 2021-09-17
  Administered 2021-09-16 – 2021-09-17 (×2): 40 mg via INTRAVENOUS
  Filled 2021-09-16 (×2): qty 4

## 2021-09-16 MED ORDER — HEPARIN SODIUM (PORCINE) 5000 UNIT/ML IJ SOLN
5000.0000 [IU] | Freq: Three times a day (TID) | INTRAMUSCULAR | Status: DC
  Administered 2021-09-16 – 2021-09-17 (×2): 5000 [IU] via SUBCUTANEOUS
  Filled 2021-09-16 (×2): qty 1

## 2021-09-16 MED ORDER — AMLODIPINE BESYLATE 5 MG PO TABS
2.5000 mg | ORAL_TABLET | Freq: Every day | ORAL | Status: DC
  Administered 2021-09-17: 2.5 mg via ORAL
  Filled 2021-09-16: qty 1

## 2021-09-16 NOTE — Assessment & Plan Note (Addendum)
Creatinine improved to 1.67 today.  Continue to monitor as outpatient. ?

## 2021-09-16 NOTE — Evaluation (Signed)
Physical Therapy Evaluation ?Patient Details ?Name: [name] [name] ?MRN: [id number] ?DOB: [birth year] ?Today's Date: 09/16/2021 ? ?History of Present Illness ? Pt is a 61 y.o. female who presents to the ED for c/o Chest Pain and SOB, arrived with an SpO2 of 84% room air. MD diagnosis of Acute Diastolic HF, Symptomatic Anemia, and Acute Resp. Failure w/ Hypoxia. PmHx: CHF, T2DM, HTN, COPD, CKD, Anemia. ?  ?Clinical Impression ? Pt using the bathroom upon PT entrance into room for evaluation today. Pt was able to return to bed w/ SUPERVISION for start of the session and was found to have SpO2 of 91% on room air. Pt is A&Ox4 and reports prior to hospitalization she was independent w/ ADLs and lives at home with several family members.  ? ?Prior to further mobility/ambulation, [place] was donned by Pt and SpO2 increased to 96% sitting EOB on 2L of O2. Pt was able to perform sit to stand from EOB w/ SUPERVISION and RW. Once standing she was able to ambulate ~54ft w/ CGA and RW; throughout ambulation SpO2 varied between 90-93% on 2L of O2 tank. No c/o negative symptoms throughout ambulation; Pt returned to recliner with all needs within reach. PT provided education on beneficial usage of a RW for energy conservation with ambulation; she expressed understanding of this education. Pt will benefit from continued skilled PT in order to increase LE strength, endurance, mobility and restore PLOF. Current discharge recommendation to HHPT is appropriate due to the level of assistance required by the patient to ensure safety and improve overall function. ? ?   ? ?Recommendations for follow up therapy are one component of a multi-disciplinary discharge planning process, led by the attending physician.  Recommendations may be updated based on patient status, additional functional criteria and insurance authorization. ? ?Follow Up Recommendations Home health PT ? ?  ?Assistance Recommended at Discharge Intermittent Supervision/Assistance   ?Patient can return home with the following ? A little help with walking and/or transfers;A little help with bathing/dressing/bathroom;Assistance with cooking/housework;Assist for transportation;Help with stairs or ramp for entrance ? ?  ?Equipment Recommendations Rolling walker (2 wheels)  ?Recommendations for Other Services ?    ?  ?Functional Status Assessment Patient has had a recent decline in their functional status and demonstrates the ability to make significant improvements in function in a reasonable and predictable amount of time.  ? ?  ?Precautions / Restrictions Precautions ?Precautions: Fall ?Restrictions ?Weight Bearing Restrictions: No  ? ?  ? ?Mobility ? Bed Mobility ?  ?  ?  ?  ?  ?  ?  ?  ?  ? ?Transfers ?Overall transfer level: Needs assistance ?  ?Transfers: Sit to/from Stand ?Sit to Stand: Supervision ?  ?  ?  ?  ?  ?  ?  ? ?Ambulation/Gait ?Ambulation/Gait assistance: Min guard ?Gait Distance (Feet): 80 Feet ?Assistive device: Rolling walker (2 wheels) ?Gait Pattern/deviations: Step-through pattern, Decreased step length - right, Decreased step length - left, Decreased stride length ?Gait velocity: decreased ?  ?  ?  ? ?Stairs ?  ?  ?  ?  ?  ? ?Wheelchair Mobility ?  ? ?Modified Rankin (Stroke Patients Only) ?  ? ?  ? ?Balance   ?  ?  ?  ?  ?  ?  ?  ?  ?  ?  ?  ?  ?  ?  ?  ?  ?  ?  ?   ? ? ? ?Pertinent Vitals/Pain Pain Assessment ?Pain Assessment:  No/denies pain  ? ? ?Home Living Family/patient expects to be discharged to:: Private residence ?Living Arrangements: Children ?Available Help at Discharge: Family ?Type of Home: House ?Home Access: Stairs to enter ?Entrance Stairs-Rails: None ?Entrance Stairs-Number of Steps: 4 ?Alternate Level Stairs-Number of Steps: 4 steps to the bottom level, 6 to the top. ?Home Layout: Other (Comment);Multi-level ?Home Equipment: Rollator (4 wheels) ?   ?  ?Prior Function Prior Level of Function : Independent/Modified Independent ?  ?  ?  ?  ?  ?  ?  ?  ?   ? ? ?Hand Dominance  ?   ? ?  ?Extremity/Trunk Assessment  ? Upper Extremity Assessment ?Upper Extremity Assessment: Overall WFL for tasks assessed ?  ? ?Lower Extremity Assessment ?Lower Extremity Assessment: Overall WFL for tasks assessed ?  ? ?   ?Communication  ?    ?Cognition Arousal/Alertness: Awake/alert ?Behavior During Therapy: [facility] for tasks assessed/performed ?Overall Cognitive Status: Within Functional Limits for tasks assessed ?  ?  ?  ?  ?  ?  ?  ?  ?  ?  ?  ?  ?  ?  ?  ?  ?  ?  ?  ? ?  ?General Comments   ? ?  ?Exercises    ? ?Assessment/Plan  ?  ?PT Assessment Patient needs continued PT services  ?PT Problem List Decreased strength;Decreased mobility;Decreased coordination;Decreased range of motion;Decreased activity tolerance;Decreased balance;Cardiopulmonary status limiting activity ? ?   ?  ?PT Treatment Interventions DME instruction;Therapeutic exercise;Gait training;Balance training;Stair training;Neuromuscular re-education;Functional mobility training;Therapeutic activities   ? ?PT Goals (Current goals can be found in the Care Plan section)  ?Acute Rehab PT Goals ?Patient Stated Goal: to get better to go home ?PT Goal Formulation: With patient ?Time For Goal Achievement: 09/30/21 ?Potential to Achieve Goals: Good ? ?  ?Frequency Min 2X/week ?  ? ? ?Co-evaluation   ?  ?  ?  ?  ? ? ?  ?AM-PAC PT "6 Clicks" Mobility  ?Outcome Measure Help needed turning from your back to your side while in a flat bed without using bedrails?: A Little ?Help needed moving from lying on your back to sitting on the side of a flat bed without using bedrails?: A Little ?Help needed moving to and from a bed to a chair (including a wheelchair)?: A Little ?Help needed standing up from a chair using your arms (e.g., wheelchair or bedside chair)?: A Little ?Help needed to walk in hospital room?: A Little ?Help needed climbing 3-5 steps with a railing? : A Little ?6 Click Score: 18 ? ?  ?End of Session Equipment Utilized  During Treatment: Gait belt ?Activity Tolerance: Patient tolerated treatment well;Patient limited by fatigue ?Patient left: in chair;with chair alarm set;with call bell/phone within reach ?Nurse Communication: Mobility status ?PT Visit Diagnosis: Unsteadiness on feet (R26.81);History of falling (Z91.81);Muscle weakness (generalized) (M62.81);Other abnormalities of gait and mobility (R26.89) ?  ? ?Time: 1975-8832 ?PT Time Calculation (min) (ACUTE ONLY): 27 min ? ? ?Charges:     ?  ?  ?   ? ? ?Jonnie Kind, SPT ?09/16/2021, 3:37 PM ? ?

## 2021-09-16 NOTE — Assessment & Plan Note (Addendum)
Today's hemoglobin 7.7.  Ferritin above 200 which makes iron deficiency anemia unlikely.  More consistent with anemia of chronic disease.  Continue to monitor as outpatient. ?

## 2021-09-16 NOTE — Plan of Care (Signed)

## 2021-09-16 NOTE — Progress Notes (Signed)
*  PRELIMINARY RESULTS* ?Echocardiogram ?2D Echocardiogram has been performed. ? ?[name] [name], [place] ?09/16/2021, 12:10 PM ?

## 2021-09-16 NOTE — Progress Notes (Signed)
?  Progress Note ? ? ?Patient: [name] [name] KKX:[id number] DOB: Sep 28, 1960 DOA: 09/15/2021     1 ?DOS: the patient was seen and examined on 09/16/2021 ?  ? ?Assessment and Plan: ?* Acute diastolic HF (heart failure) ([place])- (present on admission) ?Today's echocardiogram showed a normal ejection fraction.  Continue Lasix IV twice daily. ? ?Anemia of chronic disease ?Today's hemoglobin 7.6.  Ferritin above 200 which makes iron deficiency anemia unlikely.  More consistent with anemia of chronic disease.  Continue to monitor hemoglobin and transfuse if drops less than 7. ? ?Type 2 diabetes mellitus with hyperlipidemia ([place]) ?Last 3 sugars 111, 128 and 156.  Patient on detemir insulin 25 units and short acting sliding scale.  Continue atorvastatin also. ? ?Chronic kidney disease, stage 3b ([place])- (present on admission) ?Monitor closely with diuresis creatinine 1.8 today. ? ?Obesity, Class III, BMI 40-49.9 (morbid obesity) ([place]) ?BMI 41.29.  Monitor with diuresis. ? ?Acute respiratory failure with hypoxia ( [place])- (present on admission) ?Patient had a pulse ox of 87% on presentation was put on 2 L of oxygen.  Hopefully able to come off the oxygen prior to disposition. ? ?Diabetic neuropathy ([place])- (present on admission) ?Continue Lyrica. ? ? ? ? ? ? ? ?Subjective: Patient feels shortness of breath especially with walking around.  Admitted with CHF. ? ?Physical Exam: ?Vitals:  ? 09/16/21 0601 09/16/21 0727 09/16/21 1100 09/16/21 1549  ?BP: (!) 150/66 (!) 121/51 134/66 (!) 145/65  ?Pulse: 84 80 87 81  ?Resp: 18     ?Temp: 100.1 ?F (37.8 ?C) 98 ?F (36.7 ?C) (!) 97.4 ?F (36.3 ?C) 97.9 ?F (36.6 ?C)  ?TempSrc:   Oral Oral  ?SpO2: 93% 97% 96% 98%  ?Weight:      ? ?Physical Exam ?HENT:  ?   Head: Normocephalic.  ?   Mouth/Throat:  ?   Pharynx: No oropharyngeal exudate.  ?Eyes:  ?   General: Lids are normal.  ?   Conjunctiva/sclera: Conjunctivae normal.  ?Cardiovascular:  ?   Rate and Rhythm: Normal rate and regular rhythm.  ?   Heart  sounds: Normal heart sounds, S1 normal and S2 normal.  ?Pulmonary:  ?   Breath sounds: Examination of the right-lower field reveals decreased breath sounds and rales. Examination of the left-lower field reveals decreased breath sounds and rales. Decreased breath sounds and rales present. No wheezing or rhonchi.  ?Musculoskeletal:  ?   Right lower leg: Swelling present.  ?   Left lower leg: Swelling present.  ?Skin: ?   General: Skin is warm.  ?   Findings: No rash.  ?Neurological:  ?   Mental Status: She is alert and oriented to person, place, and time.  ?  ? ?Data Reviewed: ?Laboratory and radiological data reviewed.  Creatinine 1.8, ferritin 210, hemoglobin 7.6 ? ?Family Communication: Spoke with fianc? at bedside ? ?Disposition: ?Status is: Inpatient ?Remains inpatient appropriate because: Being treated for CHF and also has anemia ? ?Planned Discharge Destination: Home and Home with Home Health ? ? ?Author: ?Loletha Grayer, MD ?09/16/2021 4:55 PM ? ?For on call review [email or web address].  ? ?

## 2021-09-16 NOTE — Assessment & Plan Note (Addendum)
BMI 41.36.   ?

## 2021-09-16 NOTE — Consult Note (Signed)
?  Heart Failure Nurse Navigator Note ? ?HFpEF 60 to 65%.  Grade 2 diastolic dysfunction.  Left atrium is mildly dilated. ? ?She presented to the emergency room with complaints of worsening shortness of breath and lower extremity edema for a duration of about 3 weeks.  Chest x-ray revealed Neri edema and BNP of 229. ? ?Comorbidities: ? ?COPD ?Chronic anemia ?Chronic kidney disease stage III ?Diabetes ?Neuropathy ? ?Medications: ? ?Atorvastatin 40 mg daily ?Carvedilol 12.5 mg 2 times a day with meal daily ?Hydralazine 50 mg 3 times a day ?Spironolactone 25 mg daily ? ?Scheduled to start amlodipine 2.5 mg daily and Lasix 40 mg IV 2 times a day ? ? ?Labs: ? ?Sodium 141, potassium 4.3, chloride 102, CO2 18, BUN 53, creatinine 1.81, hemoglobin 7.6 ?Intake not documented ?Output 900 mL ?Weight 109.1 kg ?Blood pressure 134/66 ? ? ?Initial meeting with patient today she was sitting up in the chair at bedside in no acute distress. ? ?She states that she lives with a daughter and a friend, states the members in the household each take turns preparing the meals but lots of time she does not feel like doing that. ? ?She admits to drinking more than 64 ounces of any liquid during the day, she states that she drinks a lot of water.  She also admits to dietary indiscretions such as canned soups, hot dogs bologna sandwiches etc. ? ?Discussed heart failure and the relationship with being compliant with salt/sodium and fluid intake. ? ?She also states she does not weigh herself on a daily basis.  Discussed the importance of daily weights and what to report. ? ?Also discussed her follow-up in the outpatient heart failure clinic.  She has an appointment on March 10 at 1 PM.  She has a 20% ratio of no-show which is 37 out of 181 appointments. ? ?She was given the living with heart failure teaching booklet, zone magnet and information on low-sodium. ? ?Pricilla Riffle RN [facility] ?

## 2021-09-16 NOTE — Assessment & Plan Note (Addendum)
Continue detemir insulin 25 units at night and give short acting insulin prior to meals. ?

## 2021-09-17 DIAGNOSIS — D638 Anemia in other chronic diseases classified elsewhere: Secondary | ICD-10-CM | POA: Diagnosis not present

## 2021-09-17 DIAGNOSIS — J449 Chronic obstructive pulmonary disease, unspecified: Secondary | ICD-10-CM | POA: Diagnosis not present

## 2021-09-17 DIAGNOSIS — E0842 Diabetes mellitus due to underlying condition with diabetic polyneuropathy: Secondary | ICD-10-CM | POA: Diagnosis not present

## 2021-09-17 DIAGNOSIS — I5031 Acute diastolic (congestive) heart failure: Secondary | ICD-10-CM | POA: Diagnosis not present

## 2021-09-17 DIAGNOSIS — J9601 Acute respiratory failure with hypoxia: Secondary | ICD-10-CM | POA: Diagnosis not present

## 2021-09-17 DIAGNOSIS — E1169 Type 2 diabetes mellitus with other specified complication: Secondary | ICD-10-CM | POA: Diagnosis not present

## 2021-09-17 DIAGNOSIS — N1832 Chronic kidney disease, stage 3b: Secondary | ICD-10-CM | POA: Diagnosis not present

## 2021-09-17 DIAGNOSIS — E785 Hyperlipidemia, unspecified: Secondary | ICD-10-CM | POA: Diagnosis not present

## 2021-09-17 LAB — GLUCOSE, CAPILLARY
Glucose-Capillary: 134 mg/dL — ABNORMAL HIGH (ref 70–99)
Glucose-Capillary: 194 mg/dL — ABNORMAL HIGH (ref 70–99)

## 2021-09-17 LAB — BASIC METABOLIC PANEL
Anion gap: 8 (ref 5–15)
BUN: 55 mg/dL — ABNORMAL HIGH (ref 6–20)
CO2: 20 mmol/L — ABNORMAL LOW (ref 22–32)
Calcium: 9.1 mg/dL (ref 8.9–10.3)
Chloride: 114 mmol/L — ABNORMAL HIGH (ref 98–111)
Creatinine, Ser: 1.67 mg/dL — ABNORMAL HIGH (ref 0.44–1.00)
GFR, Estimated: 35 mL/min — ABNORMAL LOW (ref >60)
Glucose, Bld: 141 mg/dL — ABNORMAL HIGH (ref 70–99)
Potassium: 4.1 mmol/L (ref 3.5–5.1)
Sodium: 142 mmol/L (ref 135–145)

## 2021-09-17 LAB — HEMOGLOBIN: Hemoglobin: 7.7 g/dL — ABNORMAL LOW (ref 12.0–15.0)

## 2021-09-17 MED ORDER — MOMETASONE FURO-FORMOTEROL FUM 100-5 MCG/ACT IN AERO: 2.0000 | INHALATION_SPRAY | Freq: Two times a day (BID) | RESPIRATORY_TRACT | 0 refills | Status: DC

## 2021-09-17 MED ORDER — INSULIN ASPART 100 UNIT/ML CARTRIDGE (PENFILL): 4.0000 [IU] | Freq: Three times a day (TID) | SUBCUTANEOUS | 0 refills | Status: DC

## 2021-09-17 MED ORDER — TORSEMIDE 20 MG PO TABS
ORAL_TABLET | ORAL | 0 refills | Status: DC
Start: 2021-09-17 — End: 2021-09-25

## 2021-09-17 MED ORDER — INSULIN PEN NEEDLE 33G X 5 MM MISC: 1.0000 | Freq: Three times a day (TID) | 1 refills | Status: DC

## 2021-09-17 MED ORDER — AMLODIPINE BESYLATE 2.5 MG PO TABS: 2.5000 mg | ORAL_TABLET | Freq: Every day | ORAL | 0 refills | Status: DC

## 2021-09-17 MED ORDER — LANTUS SOLOSTAR 100 UNIT/ML ~~LOC~~ SOPN: 25.0000 [IU] | PEN_INJECTOR | Freq: Every day | SUBCUTANEOUS | 0 refills | Status: DC

## 2021-09-17 NOTE — Assessment & Plan Note (Signed)
Respiratory status improved.  Continue Dulera inhaler. ?

## 2021-09-17 NOTE — TOC Transition Note (Signed)
Transition of Care (TOC) - CM/SW Discharge Note ? ? ?Patient Details  ?Name: [name] [name] ?MRN: [id number] ?Date of Birth: 1961-05-24 ? ?Transition of Care (TOC) CM/SW Contact:  ?Alberteen Sam, LCSW ?Phone Number: ?09/17/2021, 2:12 PM ? ? ?Clinical Narrative:    ? ?Patient to discharge home today, patient reports she has family transportation and reports she does not believe she needs home health, acknowledges she can contact her PCP should she get home and change her mind. Confirmed her PCP is Dr. Margarita Rana.  ? ?Patient reports no further discharge needs at this time.  ? ?Final next level of care: Home/Self Care ?Barriers to Discharge: No Barriers Identified ? ? ?Patient Goals and CMS Choice ?Patient states their goals for this hospitalization and ongoing recovery are:: to go home ?CMS Medicare.gov Compare Post Acute Care list provided to:: Patient ?Choice offered to / list presented to : Patient ? ?Discharge Placement ?  ?           ?  ?  ?  ?  ? ?Discharge Plan and Services ?  ?  ?           ?  ?  ?  ?  ?  ?  ?  ?  ?  ?  ? ?Social Determinants of Health (SDOH) Interventions ?  ? ? ?Readmission Risk Interventions ?Readmission Risk Prevention Plan 12/05/2020 08/18/2019 06/05/2019  ?Transportation Screening Complete Complete Complete  ?PCP or Specialist Appt within 3-5 Days Complete - -  ?[place] or Home Care Consult Complete - -  ?Social Work Consult for [place] Planning/Counseling Not Complete - -  ?SW consult not completed comments RNCM assigned to this patient - -  ?Palliative Care Screening Not Applicable - -  ?Medication Review Press photographer) Complete Complete Complete  ?PCP or Specialist appointment within 3-5 days of discharge - Complete Complete  ?[place] or Home Care Consult - - Complete  ?SW Recovery Care/Counseling Consult - Complete Complete  ?Palliative Care Screening - Not Applicable Not Applicable  ?[place] - Not Applicable Not Applicable  ?Some recent data might be hidden  ? ? ? ? ? ?

## 2021-09-17 NOTE — Discharge Instructions (Signed)
Weight yourself on a daily basis.  If gains 3 pounds in a day or 5 pounds in a week please call your medical doctor or CHF clinic. ?

## 2021-09-17 NOTE — Discharge Summary (Signed)
Physician Discharge Summary   Patient: [name] [name] MRN: [id number] DOB: 1961-03-02  Admit date:     09/15/2021  Discharge date: 09/17/21  Discharge Physician: Loletha Grayer   PCP: [name], MD   Recommendations at discharge:   Follow-up PCP 5 days Follow-up CHF clinic  Discharge Diagnoses: Principal Problem:   Acute diastolic HF (heart failure) (HCC) Active Problems:   Acute respiratory failure with hypoxia (HCC)   Symptomatic anemia   Type 2 diabetes mellitus with hyperlipidemia (HCC)   COPD without exacerbation (HCC)   Diabetic neuropathy (HCC)   Chronic kidney disease, stage 3b (HCC)   Obesity, Class III, BMI 40-49.9 (morbid obesity) ([place])   CKD (chronic kidney disease), stage III (HCC)   Anemia of chronic disease     Hospital Course: Patient was admitted to the hospital for CHF exacerbation.  Was diuresed with IV Lasix and feeling much better and wanted to go home.  Patient will be discharged on torsemide 40 mg daily to take every day.  The patient was tapered off oxygen during the hospital course.  The patient had less of her requirement for insulin being in the hospital with chronic kidney disease.  Assessment and Plan: * Acute diastolic HF (heart failure) (HCC) EF 60 to 65%.  Patient on Coreg torsemide and spironolactone.  Acute respiratory failure with hypoxia ([place]) Patient had a pulse ox of 87% on presentation was put on 2 L of oxygen.  Patient able to come off oxygen completely and held her sats with ambulation upon walking around.  Type 2 diabetes mellitus with hyperlipidemia (HCC) Continue detemir insulin 25 units at night and give short acting insulin prior to meals.  COPD without exacerbation ([place]) Respiratory status improved.  Continue Dulera inhaler.  Anemia of chronic disease Today's hemoglobin 7.7.  Ferritin above 200 which makes iron deficiency anemia unlikely.  More consistent with anemia of chronic disease.  Continue to monitor as  outpatient.  Obesity, Class III, BMI 40-49.9 (morbid obesity) (HCC) BMI 41.36.    Chronic kidney disease, stage 3b (HCC) Creatinine improved to 1.67 today.  Continue to monitor as outpatient.  Diabetic neuropathy (HCC) Continue Lyrica.           Consultants: None Procedures performed: None Disposition: Home Diet recommendation:  Cardiac and Carb modified diet  DISCHARGE MEDICATION: Allergies as of 09/17/2021       Reactions   Lisinopril Swelling        Medication List     STOP taking these medications    Flovent HFA 110 MCG/ACT inhaler Generic drug: fluticasone   Misc. Devices Misc   potassium chloride SA 20 MEQ tablet Commonly known as: KLOR-CON M   Victoza 18 MG/3ML Sopn Generic drug: liraglutide       TAKE these medications    albuterol 108 (90 Base) MCG/ACT inhaler Commonly known as: VENTOLIN HFA Inhale 1 puff into the lungs every 4 (four) hours as needed for wheezing or shortness of breath.   albuterol (2.5 MG/3ML) 0.083% nebulizer solution Commonly known as: PROVENTIL Take 3 mLs (2.5 mg total) by nebulization every 4 (four) hours as needed for wheezing or shortness of breath.   amLODipine 2.5 MG tablet Commonly known as: NORVASC Take 1 tablet (2.5 mg total) by mouth daily. Start taking on: September 18, 2021 What changed:  medication strength how much to take   atorvastatin 40 MG tablet Commonly known as: LIPITOR Take 1 tablet (40 mg total) by mouth every morning. Must have HF appt for  further refills   blood glucose meter kit and supplies Kit Dispense based on patient and insurance preference. Use up to four times daily as directed. (FOR ICD-9 250.00, 250.01).   carvedilol 12.5 MG tablet Commonly known as: COREG Take 1 tablet (12.5 mg total) by mouth 2 (two) times daily.   DRY EYES OP Place 1 drop into both eyes daily as needed (dry eyes).   ferrous sulfate 325 (65 FE) MG tablet Take 325 mg by mouth daily with breakfast.    hydrALAZINE 50 MG tablet Commonly known as: APRESOLINE TAKE ONE TABLET BY MOUTH THREE TIMES DAILY   insulin aspart cartridge Commonly known as: NOVOLOG Inject 4 Units into the skin 3 (three) times daily with meals.   Insulin Pen Needle 33G X 5 MM Misc 1 Dose by Does not apply route 3 (three) times daily before meals.   Lantus SoloStar 100 UNIT/ML Solostar Pen Generic drug: insulin glargine Inject 25 Units into the skin at bedtime. What changed:  how much to take when to take this   mometasone-formoterol 100-5 MCG/ACT Aero Commonly known as: DULERA Inhale 2 puffs into the lungs 2 (two) times daily.   nystatin ointment Commonly known as: MYCOSTATIN Apply 1 application topically 2 (two) times daily.   pregabalin 75 MG capsule Commonly known as: LYRICA TAKE ONE CAPSULE BY MOUTH TWICE DAILY   spironolactone 25 MG tablet Commonly known as: ALDACTONE Take 1 tablet (25 mg total) by mouth daily.   torsemide 20 MG tablet Commonly known as: DEMADEX Two tabs po every morning What changed:  medication strength See the new instructions.         Discharge Exam: Filed Weights   09/16/21 0500 09/16/21 2100  Weight: 109.1 kg 109.3 kg   Physical Exam HENT:     Head: Normocephalic.     Mouth/Throat:     Pharynx: No oropharyngeal exudate.  Eyes:     General: Lids are normal.     Conjunctiva/sclera: Conjunctivae normal.  Cardiovascular:     Rate and Rhythm: Normal rate and regular rhythm.     Heart sounds: Normal heart sounds, S1 normal and S2 normal.  Pulmonary:     Breath sounds: Examination of the right-lower field reveals decreased breath sounds. Examination of the left-lower field reveals decreased breath sounds. Decreased breath sounds present. No wheezing, rhonchi or rales.  Musculoskeletal:     Right lower leg: Swelling present.     Left lower leg: Swelling present.  Skin:    General: Skin is warm.     Findings: No rash.  Neurological:     Mental Status: She  is alert and oriented to person, place, and time.     Condition at discharge: stable  The results of significant diagnostics from this hospitalization (including imaging, microbiology, ancillary and laboratory) are listed below for reference.   Imaging Studies: DG Chest Port 1 View  Result Date: 09/15/2021 CLINICAL DATA:  Shortness of breath for the past 3 weeks. Chest pain. EXAM: PORTABLE CHEST 1 VIEW COMPARISON:  Chest x-ray dated May 05, 2021. FINDINGS: Unchanged cardiomegaly. New mild pulmonary vascular congestion with increased interstitial markings at the lung bases. No focal consolidation, pleural effusion, or pneumothorax. No acute osseous abnormality. IMPRESSION: 1. New mild interstitial pulmonary edema. Electronically Signed   By: Titus Dubin M.D.   On: 09/15/2021 13:16   ECHOCARDIOGRAM COMPLETE  Result Date: 09/16/2021    ECHOCARDIOGRAM REPORT   Patient Name:   [name] [name] Date of Exam: 09/16/2021 Medical  Rec #:  [id number]        Height:       64.0 in Accession #:    [id number]       Weight:       240.5 lb Date of Birth:  1960/12/02        BSA:          2.116 m Patient Age:    32 years         BP:           134/66 mmHg Patient Gender: F                HR:           87 bpm. Exam Location:  [facility] Procedure: 2D Echo, Cardiac Doppler and Color Doppler Indications:     CHF --acute diastolic D78.24  History:         Patient has prior history of Echocardiogram examinations, most                  recent 04/02/2021. CHF; Risk Factors:Hypertension and Diabetes.  Sonographer:     Sherrie Sport Referring Phys:  [id number] Emeterio Reeve Diagnosing Phys: Kate Sable MD  Sonographer Comments: Suboptimal apical window and suboptimal parasternal window. IMPRESSIONS  1. Left ventricular ejection fraction, by estimation, is 60 to 65%. The left ventricle has normal function. The left ventricle has no regional wall motion abnormalities. Left ventricular diastolic parameters are consistent with Grade  II diastolic dysfunction (pseudonormalization).  2. Right ventricular systolic function is normal. The right ventricular size is normal.  3. Left atrial size was mildly dilated.  4. The mitral valve is normal in structure. No evidence of mitral valve regurgitation.  5. The aortic valve is normal in structure. Aortic valve regurgitation is not visualized. FINDINGS  Left Ventricle: Left ventricular ejection fraction, by estimation, is 60 to 65%. The left ventricle has normal function. The left ventricle has no regional wall motion abnormalities. The left ventricular internal cavity size was normal in size. There is  no left ventricular hypertrophy. Left ventricular diastolic parameters are consistent with Grade II diastolic dysfunction (pseudonormalization). Right Ventricle: The right ventricular size is normal. No increase in right ventricular wall thickness. Right ventricular systolic function is normal. Left Atrium: Left atrial size was mildly dilated. Right Atrium: Right atrial size was normal in size. Pericardium: There is no evidence of pericardial effusion. Mitral Valve: The mitral valve is normal in structure. No evidence of mitral valve regurgitation. MV peak gradient, 12.2 mmHg. The mean mitral valve gradient is 6.0 mmHg. Tricuspid Valve: The tricuspid valve is normal in structure. Tricuspid valve regurgitation is mild. Aortic Valve: The aortic valve is normal in structure. Aortic valve regurgitation is not visualized. Aortic valve mean gradient measures 6.0 mmHg. Aortic valve peak gradient measures 12.1 mmHg. Aortic valve area, by VTI measures 2.83 cm. Pulmonic Valve: The pulmonic valve was normal in structure. Pulmonic valve regurgitation is not visualized. Aorta: The aortic root and ascending aorta are structurally normal, with no evidence of dilitation. Venous: The inferior vena cava was not well visualized. IAS/Shunts: No atrial level shunt detected by color flow Doppler.  LEFT VENTRICLE PLAX 2D LVIDd:          4.30 cm   Diastology LVIDs:         2.60 cm   LV e' medial:    4.90 cm/s LV PW:         1.00 cm   LV E/e' medial:  29.0 LV IVS:        1.30 cm   LV e' lateral:   8.05 cm/s LVOT diam:     2.10 cm   LV E/e' lateral: 17.6 LV SV:         85 LV SV Index:   40 LVOT Area:     3.46 cm  RIGHT VENTRICLE RV S prime:     14.80 cm/s TAPSE (M-mode): 3.2 cm LEFT ATRIUM             Index        RIGHT ATRIUM           Index LA diam:        4.60 cm 2.17 cm/m   RA Area:     16.40 cm LA Vol (A2C):   93.2 ml 44.05 ml/m  RA Volume:   43.70 ml  20.65 ml/m LA Vol (A4C):   75.2 ml 35.54 ml/m LA Biplane Vol: 87.0 ml 41.12 ml/m  AORTIC VALVE AV Area (Vmax):    2.87 cm AV Area (Vmean):   2.48 cm AV Area (VTI):     2.83 cm AV Vmax:           174.00 cm/s AV Vmean:          115.000 cm/s AV VTI:            0.301 m AV Peak Grad:      12.1 mmHg AV Mean Grad:      6.0 mmHg LVOT Vmax:         144.00 cm/s LVOT Vmean:        82.500 cm/s LVOT VTI:          0.246 m LVOT/AV VTI ratio: 0.82  AORTA Ao Root diam: 2.80 cm MITRAL VALVE                TRICUSPID VALVE MV Area (PHT): 2.84 cm     TR Peak grad:   47.6 mmHg MV Area VTI:   1.83 cm     TR Vmax:        345.00 cm/s MV Peak grad:  12.2 mmHg MV Mean grad:  6.0 mmHg     SHUNTS MV Vmax:       1.75 m/s     Systemic VTI:  0.25 m MV Vmean:      119.0 cm/s   Systemic Diam: 2.10 cm MV Decel Time: 267 msec MV E velocity: 142.00 cm/s MV A velocity: 140.00 cm/s MV E/A ratio:  1.01 Kate Sable MD Electronically signed by Kate Sable MD Signature Date/Time: 09/16/2021/12:48:45 PM    Final    SLEEP STUDY DOCUMENTS  Result Date: 08/19/2021 Ordered by an unspecified provider.   Microbiology: Results for orders placed or performed during the hospital encounter of 09/15/21  Resp Panel by RT-PCR (Flu A&B, Covid) Nasopharyngeal Swab     Status: None   Collection Time: 09/15/21 12:55 PM   Specimen: Nasopharyngeal Swab; Nasopharyngeal(NP) swabs in vial transport medium  Result Value Ref  Range Status   SARS Coronavirus 2 by RT PCR NEGATIVE NEGATIVE Final    Comment: (NOTE) SARS-CoV-2 target nucleic acids are NOT DETECTED.  The SARS-CoV-2 RNA is generally detectable in upper respiratory specimens during the acute phase of infection. The lowest concentration of SARS-CoV-2 viral copies this assay can detect is 138 copies/mL. A negative result does not preclude SARS-Cov-2 infection and should not be used as the sole basis for treatment or other patient management decisions. A  negative result may occur with  improper specimen collection/handling, submission of specimen other than nasopharyngeal swab, presence of viral mutation(s) within the areas targeted by this assay, and inadequate number of viral copies(<138 copies/mL). A negative result must be combined with clinical observations, patient history, and epidemiological information. The expected result is Negative.  Fact Sheet for Patients:  EntrepreneurPulse.com.au  Fact Sheet for Healthcare Providers:  IncredibleEmployment.be  This test is no t yet approved or cleared by the [place] FDA and  has been authorized for detection and/or diagnosis of SARS-CoV-2 by FDA under an Emergency Use Authorization (EUA). This EUA will remain  in effect (meaning this test can be used) for the duration of the COVID-19 declaration under Section 564(b)(1) of the Act, 21 U.S.C.section 360bbb-3(b)(1), unless the authorization is terminated  or revoked sooner.       Influenza A by PCR NEGATIVE NEGATIVE Final   Influenza B by PCR NEGATIVE NEGATIVE Final    Comment: (NOTE) The Xpert Xpress SARS-CoV-2/FLU/RSV plus assay is intended as an aid in the diagnosis of influenza from Nasopharyngeal swab specimens and should not be used as a sole basis for treatment. Nasal washings and aspirates are unacceptable for Xpert Xpress SARS-CoV-2/FLU/RSV testing.  Fact Sheet for  Patients: EntrepreneurPulse.com.au  Fact Sheet for Healthcare Providers: IncredibleEmployment.be  This test is not yet approved or cleared by the [place] FDA and has been authorized for detection and/or diagnosis of SARS-CoV-2 by FDA under an Emergency Use Authorization (EUA). This EUA will remain in effect (meaning this test can be used) for the duration of the COVID-19 declaration under Section 564(b)(1) of the Act, 21 U.S.C. section 360bbb-3(b)(1), unless the authorization is terminated or revoked.  Performed at [facility], [address] [place], [place] [address]     Labs: CBC: Recent Labs  Lab 09/15/21 1255 09/16/21 0643 09/17/21 0627  WBC 4.1 3.3*  --   HGB 8.0* 7.6* 7.7*  HCT 27.0* 25.7*  --   MCV 87.1 85.7  --   PLT 166 163  --    Basic Metabolic Panel: Recent Labs  Lab 09/15/21 1255 09/15/21 1512 09/16/21 0643 09/17/21 0622  NA 138  --  141 142  K 4.7  --  4.3 4.1  CL 113*  --  112* 114*  CO2 16*  --  18* 20*  GLUCOSE 203*  --  121* 141*  BUN 53*  --  53* 55*  CREATININE 1.81* 1.81* 1.80* 1.67*  CALCIUM 8.9  --  9.0 9.1    CBG: Recent Labs  Lab 09/16/21 1103 09/16/21 1550 09/16/21 2042 09/17/21 0728 09/17/21 1116  GLUCAP 128* 156* 129* 134* 194*    Discharge time spent: greater than 30 minutes.  Signed: Loletha Grayer, MD [facility] 09/17/2021

## 2021-09-18 ENCOUNTER — Telehealth: Payer: Self-pay

## 2021-09-18 ENCOUNTER — Other Ambulatory Visit: Payer: Self-pay

## 2021-09-18 NOTE — Telephone Encounter (Signed)
Transition Care Management Unsuccessful Follow-up Telephone Call ? ?Date of discharge and from where:  09/17/2021, [facility] ? ?Attempts:  1st Attempt ? ?Reason for unsuccessful TCM follow-up call:  Left voice message on # [phone]. Call back requested.  ? ?Need to discuss scheduling a hospital follow up appointment with PCP ? ? ? ?

## 2021-09-18 NOTE — Telephone Encounter (Signed)
error 

## 2021-09-21 ENCOUNTER — Telehealth: Payer: Self-pay

## 2021-09-21 ENCOUNTER — Other Ambulatory Visit: Payer: Self-pay | Admitting: Obstetrics and Gynecology

## 2021-09-21 ENCOUNTER — Ambulatory Visit: Payer: Self-pay

## 2021-09-21 ENCOUNTER — Other Ambulatory Visit: Payer: Self-pay

## 2021-09-21 NOTE — Patient Instructions (Signed)
Hi [name] [name], thank you for speaking with me-I hope you feel better! ? ?[name] [name] was given information about Medicaid Managed Care team care coordination services as a part of their [place] Medicaid benefit. [name] [name] verbally consented to engagement with the [facility] Managed Care team.  ? ?If you are experiencing a medical emergency, please call 911 or report to your local emergency department or urgent care.  ? ?If you have a non-emergency medical problem during routine business hours, please contact your provider's office and ask to speak with a nurse.  ? ?For questions related to your [facility], please call: [phone] or visit the homepage here: [email or web address] ? ?If you would like to schedule transportation through your [facility], please call the following number at least 2 days in advance of your appointment: [phone]. ? Rides for urgent appointments can also be made after hours by calling Member Services. ? ?Call the [facility] at [phone], at any time, 24 hours a day, 7 days a week. If you are in danger or need immediate medical attention call 911. ? ?If you would like help to quit smoking, call 1-800-QUIT-NOW (408)619-3693) OR Espa?ol: 1-855-D?jelo-Ya 818-252-6915) o para m?s informaci?n haga clic aqu? or Text READY to 200-400 to register via text ? ?[name] [name] - following are the goals we discussed in your visit today:  ? Goals Addressed   ? ?  ?  ?  ?  ? This Visit's Progress  ?  Protect My Health     ?  Timeframe:  Long-Range Goal ?Priority:  Medium ?Start Date:          10/29/20                   ?Expected End Date: ongoing            ? ?Follow Up Date: 10/22/21 ?  ?- schedule appointment for flu shot ?- schedule appointment for vaccines needed due to my age or health ?- schedule recommended health tests (blood work,  mammogram, colonoscopy, pap test) ?- schedule and keep appointment for annual check-up  ? ?09/21/21:  patient with provider appt tomorrow for SOB, swelling ?  ?Why is this important?   ?Screening tests can find diseases early when they are easier to treat.  ?Your doctor or nurse will talk with you about which tests are important for you.  ?Getting shots for common diseases like the flu and shingles will help prevent them.   ?  ? ?Patient verbalizes understanding of instructions and care plan provided today and agrees to view in [place]. Active MyChart status confirmed with patient.   ? ?The Managed Medicaid care management team will reach out to the patient again over the next 30 days.  ?The  Patient  has been provided with contact information for the Managed Medicaid care management team and has been advised to call with any health related questions or concerns.  ? ?[name] RN, BSN ?[facility] ?Care Management Coordinator - Managed Medicaid High Risk ?[phone] ?  ?Following is a copy of your plan of care:  ?Care Plan : General Plan of Care (Adult)  ? ? ?Problem: Quality of Life (General Plan of Care)   ?Priority: High  ?Onset Date: 10/29/2020  ?  ? ?Long-Range Goal: Quality of Life Maintained   ?Start Date: 10/29/2020  ?Expected End Date: 12/22/2021  ?Recent Progress: Not on track  ?  Priority: High  ?Note:   ? CARE PLAN ENTRY ?Medicaid Managed Care ?(see longitudinal plan of care for additional care plan information) ? ?Current Barriers:  ?Patient hospitalized 09/15/21 -09/18/21 for CHF.  Today patient complains of SOB and swelling in face, eyes, legs, and feet.  Patient has scheduled an appointment for evaluation in the morning. ? ?Nurse Case Manager Clinical Goal(s):  ?Over the next 30 days, patient will verbalize understanding of plan for decreased swelling.  ?Over the next 30 days, patient will work with provider to address needs related to swelling ?Over the next 30 days, patient will  continue to work with Pharmacist. ?Over the next 30 days, patient will attend all scheduled appointments. ?Over the next 30 days, patient will receive requested resources. ? ?Interventions:  ?Inter-disciplinary care team collaboration (see longitudinal plan of care) ?Reviewed medications with patient. ?Discussed plans with patient for ongoing care management follow up and provided patient with direct contact information for care management team. ?Collaboration with Pharmacist for review of medications. ?Pharmacy referral for medication review. ?Collaborated with BSW for resources. ?BSW  Referral for resources. ?Discussed symptoms with patient-patient to report to an [facility] or ED if symptoms increase instead of waiting for appointment in the morning. ? ?Plan:  ?Patient will follow up with provider and fill all prescriptions ?RNCM will follow up with patient within 30 days.  ?  ?

## 2021-09-21 NOTE — Telephone Encounter (Signed)
Transition Care Management Unsuccessful Follow-up Telephone Call ? ?Date of discharge and from where:  09/17/2021, [facility] ? ?Attempts:  2nd Attempt ? ?Reason for unsuccessful TCM follow-up call:  Left voice message on # [phone]. Call back requested.  ?  ?Need to discuss scheduling a hospital follow up appointment with PCP   ? ? ? ?

## 2021-09-21 NOTE — Patient Outreach (Signed)
Medicaid Managed Care   Nurse Care Manager Note  09/21/2021 Name:  [name] [name] MRN:  [id number] DOB:  [birth year]  [name] [name] is an 61 y.o. year old female who is [name] primary patient of [name] Rakes, [name].  The [facility] Managed Care Coordination team was consulted for assistance with:    Chronic healthcare management needs, OSA, CHF, DM, GERD, CKD, COPD, HLD, Hep C  [name] [name] was given information about Medicaid Managed Care Coordination team services today. [name] [name] Patient agreed to services and verbal consent obtained.  Engaged with patient by telephone for follow up visit in response to provider referral for case management and/or care coordination services.   Assessments/Interventions:  Review of past medical history, allergies, medications, health status, including review of consultants reports, laboratory and [name] [name] test data, was performed as part of comprehensive evaluation and provision of chronic care management services.  SDOH (Social Determinants of Health) assessments and interventions performed: SDOH Interventions    Flowsheet Row Most Recent Value  SDOH Interventions   Intimate Partner Violence Interventions Intervention Not Indicated  Stress Interventions Intervention Not Indicated      Care Plan  Allergies  Allergen Reactions   Lisinopril Swelling   Medications Reviewed Today     Reviewed by [name] Medicus, RN (Registered Nurse) on 09/21/21 at 1531  Med List Status: <None>   Medication Order Taking? Sig Documenting Provider Last Dose Status Informant  albuterol (PROVENTIL) (2.5 MG/3ML) 0.083% nebulizer solution [id number] No Take 3 mLs (2.5 mg total) by nebulization every 4 (four) hours as needed for wheezing or shortness of breath.  Patient not taking: Reported on 09/15/2021   [name] [name] Not Taking Active Self  albuterol (VENTOLIN HFA) 108 (90 Base) MCG/ACT inhaler [id number] No Inhale 1 puff into the lungs every 4 (four) hours as  needed for wheezing or shortness of breath. [name] Rakes, [name] 09/14/2021 unknown Active Self  amLODipine (NORVASC) 2.5 MG tablet [id number]  Take 1 tablet (2.5 mg total) by mouth daily. [name] Grayer, [name]  Active   Artificial Tear Ointment (DRY EYES OP) [id number] No Place 1 drop into both eyes daily as needed (dry eyes). Provider, Historical, [name] prn Active [name] [name]  atorvastatin (LIPITOR) 40 MG tablet [id number] No Take 1 tablet (40 mg total) by mouth every morning. Must have HF appt for further refills  Patient not taking: Reported on 09/15/2021   [name] [name], [place] Not Taking Active Pharmacy Records           Med Note [name] [name] Sep 15, 2021  3:25 PM) Last filled November of 2022 30 d/s  blood glucose meter kit and supplies KIT [id number] No Dispense based on patient and insurance preference. Use up to four times daily as directed. (FOR ICD-9 250.00, 250.01). [name] Flock, [name] Taking Active [name] [name]  carvedilol (COREG) 12.5 MG tablet [id number] No Take 1 tablet (12.5 mg total) by mouth 2 (two) times daily. [name] [name] 09/14/2021 2000 Active Self  ferrous sulfate 325 (65 FE) MG tablet [id number] No Take 325 mg by mouth daily with breakfast. Provider, Historical, [name] Taking Active Self  hydrALAZINE (APRESOLINE) 50 MG tablet [id number] No TAKE ONE TABLET BY MOUTH THREE TIMES DAILY [name] Rakes, [name] 09/14/2021 2000 Active Self  insulin aspart (NOVOLOG) cartridge [id number]  Inject 4 Units into the skin 3 (three) times daily with meals. [name] Grayer, [name]  Active   insulin glargine (LANTUS SOLOSTAR) 100 UNIT/ML Solostar Pen [id number]  Inject  25 Units into the skin at bedtime. [name] Grayer, [name]  Active   Insulin Pen Needle 33G X 5 MM MISC [id number]  1 Dose by Does not apply route 3 (three) times daily before meals. [name] Grayer, [name]  Active   mometasone-formoterol [facility]) 100-5 MCG/ACT Hollie Salk [id number]  Inhale 2 puffs into the lungs 2 (two) times daily. [name] Grayer, [name]   Active   nystatin ointment (MYCOSTATIN) [id number] No Apply 1 application topically 2 (two) times daily. [name] Rakes, [name] Taking Active Pharmacy Records  pregabalin Spray [facility]) 75 MG capsule [id number] No TAKE ONE CAPSULE BY MOUTH TWICE DAILY [name] Rakes, [name] 09/14/2021 2000 Active Self  spironolactone (ALDACTONE) 25 MG tablet [id number] No Take 1 tablet (25 mg total) by mouth daily. [name] Rakes, [name] 09/14/2021 0800 Active Self  torsemide (DEMADEX) 20 MG tablet [id number]  Two tabs po every morning [name] Grayer, [name]  Active            Patient Active Problem List   Diagnosis Date Noted   CKD (chronic kidney disease) stage 4, GFR 15-29 ml/min ([place]) 07/03/2021   Acute respiratory failure due to COVID-19 ([place]) 05/06/2021   COVID-19 05/05/2021   CHF exacerbation ([place]) 04/02/2021   Chest pain 04/02/2021   Acute-on-chronic kidney injury ([place]) 04/02/2021   COPD exacerbation ([place]) 12/05/2020   CKD (chronic kidney disease), stage III ([place]) 12/04/2020   Anemia of chronic disease 12/04/2020   Obesity, Class III, BMI 40-49.9 (morbid obesity) ([place]) 07/27/2020   Acute respiratory disease due to COVID-19 virus 07/25/2020   Acute hypoxemic respiratory failure due to COVID-19 ([place]) 07/25/2020   Morbid obesity ([place]) 11/23/2019   Acute respiratory failure with hypoxia ([place]) 11/06/2019   Chronic kidney disease, stage 3b ([place]) 11/06/2019   COPD without exacerbation ([place])    Acute kidney failure ([place]) 08/18/2019   Left ventricular hypertrophy 07/21/2019   Educated about COVID-19 virus infection 97/74/1423   Acute diastolic HF (heart failure) ([place])    Hypoxia    Sleep apnea 05/09/2019   Hyperglycemia 04/09/2019   Snoring 03/13/2019   Symptomatic anemia 08/17/2018   Chronic cystitis 08/03/2018   Diabetic neuropathy ([place]) 06/16/2017   Non compliance w medication regimen 04/12/2017   Family history of colon cancer in mother 11/17/2016   Dyspareunia in female 11/11/2016   Screen for colon cancer  11/11/2016   History of ovarian cyst 11/11/2016   Chronic hepatitis C without hepatic coma ([place]) 11/09/2016   Hyperlipidemia 10/07/2016   Type 2 diabetes mellitus with hyperlipidemia ([place]) 01/29/2014   Status post intraocular lens implant 11/08/2013   PCO (posterior capsular opacification) 09/28/2013   Pseudophakia 09/12/2013   GERD (gastroesophageal reflux disease) 09/06/2013   Hypertension 09/06/2013   ANEMIA, IRON DEFICIENCY 10/03/2009   LIPOMA 06/18/2009   TINEA PEDIS 05/07/2009   SUBSTANCE ABUSE, MULTIPLE 02/22/2007   HCVD (hypertensive cardiovascular disease) 02/22/2007   Conditions to be addressed/monitored per PCP order:  Chronic healthcare management needs, OSA, CHF, DM, GERD, CKD, COPD, HLD, Hep C  Care Plan : General Plan of Care (Adult)  Updates made by [name] Medicus, RN since 09/21/2021 12:00 AM     Problem: Therapeutic Alliance (General Plan of Care)   Priority: Medium  Onset Date: 05/28/2020     Long-Range Goal: Therapeutic Alliance Established   Start Date: 10/29/2020  Expected End Date: 09/24/2021  Recent Progress: On track  Priority: Medium  Note:        Problem: Quality of Life (General Plan of Care)   Priority: High  Onset Date: 10/29/2020     Long-Range Goal: Quality of Life Maintained   Start Date: 10/29/2020  Expected End Date: 12/22/2021  Recent Progress: Not on track  Priority: High  Note:    CARE PLAN ENTRY Medicaid Managed Care (see longitudinal plan of care for additional care plan information)  Current Barriers:  Patient hospitalized 09/15/21 -09/18/21 for CHF.  Today patient complains of SOB and swelling in face, eyes, legs, and feet.  Patient has scheduled an appointment for evaluation in the morning.  Nurse Case Manager Clinical Goal(s):  Over the next 30 days, patient will verbalize understanding of plan for decreased swelling.  Over the next 30 days, patient will work with provider to address needs related to swelling Over the next 30  days, patient will continue to work with Pharmacist. Over the next 30 days, patient will attend all scheduled appointments. Over the next 30 days, patient will receive requested resources.  Interventions:  Inter-disciplinary care team collaboration (see longitudinal plan of care) Reviewed medications with patient. Discussed plans with patient for ongoing care management follow up and provided patient with direct contact information for care management team. Collaboration with Pharmacist for review of medications. Pharmacy referral for medication review. Collaborated with BSW for resources. BSW  Referral for resources. Discussed symptoms with patient-patient to report to an [facility] or ED if symptoms increase instead of waiting for appointment in the morning.  Plan:  Patient will follow up with provider and fill all prescriptions RNCM will follow up with patient within 30 days.   Follow Up:  Patient agrees to Care Plan and Follow-up.  Plan: The Managed Medicaid care management team will reach out to the patient again over the next 30 days. and The  Patient has been provided with contact information for the Managed Medicaid care management team and has been advised to call with any health related questions or concerns.  Date/time of next scheduled RN care management/care coordination outreach:  10/22/21 at 330.

## 2021-09-21 NOTE — Telephone Encounter (Signed)
Transition Care Management Follow-up Telephone Call ?Date of discharge and from where: 09/17/2021, [facility] ?How have you been since you were released from the hospital? She reports facial swelling as well as swelling of her feet, ankles and legs. She just spoke to [facility] triage and Carilyn Goodpasture, RN and is scheduled to see Lazaro Arms, NP tomorrow @ [facility].  There were no available appointments in the clinic today. She explained that  she had an allergic reaction to lisinopril in the past and developed swelling similar to what she has now but she  did not feel it is necessary to go to ED now.  She said that the swelling started this weekend.  I instructed her to seek care in ED if swelling worsens or she has difficulty breathing/ chest pain and she said she understood.  ?Any questions or concerns? Yes - noted above.  ?She needs a nebulizer ? ?Items Reviewed: ?Did the pt receive and understand the discharge instructions provided? Yes  ?Medications obtained and verified? Yes  - she said she has all of her medications and is expecting a delivery of medication refills this afternoon. She has a glucometer to check blood sugars. She said she administers her own insulin or her daughter administers it for her. She didn't have any questions about her med regime.   ?Other? No  ?Any new allergies since your discharge? No  ?Dietary orders reviewed? No ?Do you have support at home? Yes  ? ?Home Care and Equipment/Supplies: ?Were home health services ordered? no ?If so, what is the name of the agency? N/a  ?Has the agency set up a time to come to the patient's home? not applicable ?Were any new equipment or medical supplies ordered?  No ?What is the name of the medical supply agency? N/a ?Were you able to get the supplies/equipment? not applicable ?Do you have any questions related to the use of the equipment or supplies? No ? ?Functional Questionnaire: (I = Independent and D = Dependent) ?ADLs: has RW to use  with ambulation. Has family to assist with ADLs as needed.  ? ? ? ?Follow up appointments reviewed: ? ?PCP Hospital f/u appt confirmed? Yes  Scheduled to see Lazaro Arms, NP - 09/22/2021.  ?[facility] f/u appt confirmed? Yes  Scheduled to see [facility] - 09/25/2021, Neurology- 10/13/2021.  ?Are transportation arrangements needed? No  - she stated that her boyfriend can drive her.  ?If their condition worsens, is the pt aware to call PCP or go to the Emergency Dept.? Yes ?Was the patient provided with contact information for the PCP's office or ED? Yes ?Was to pt encouraged to call back with questions or concerns? Yes ? ?

## 2021-09-21 NOTE — Telephone Encounter (Signed)
From her discharge call: ? ?She reports facial swelling as well as swelling of her feet, ankles and legs. She just spoke to [facility] triage and Carilyn Goodpasture, RN and is scheduled to see Lazaro Arms, NP tomorrow @ [facility].  There were no available appointments in the clinic today. She explained that  she had an allergic reaction to lisinopril in the past and developed swelling similar to what she has now but she does not feel it is necessary to go to ED now.  She said that the swelling started this weekend.  I instructed her to seek care in ED if swelling worsens or she has difficulty breathing/ chest pain and she said she understood.  ? ?she said she has all of her medications and is expecting a delivery of medication refills this afternoon. She has a glucometer to check blood sugars. She said she administers her own insulin or her daughter administers it for her. She didn't have any questions about her med regime.  She needs a nebulizer. ? ?has RW to use with ambulation. Has family to assist with ADLs as needed.  ? ?Scheduled to see Lazaro Arms, NP - 09/22/2021.  ? ?Scheduled to see [facility] - 09/25/2021, Neurology- 10/13/2021.  ? ?

## 2021-09-21 NOTE — Telephone Encounter (Signed)
? ? ? ?  Chief Complaint: Reports she came home from hospital this past Friday. Has noticed face is puffy today at her eyes and jaw. ?Symptoms: Mild SOB.Instructed to call 911 for worsening of symptoms. ?Frequency: This weekend. ?Pertinent Negatives: Patient denies chest pain. ?Disposition: '[]'$ ED /'[]'$ Urgent Care (no appt availability in office) / '[]'$ Appointment(In office/virtual)/ '[]'$  [facility]/ '[]'$ Home Care/ '[]'$ Refused Recommended Disposition /'[]'$ [facility]/ '[]'$  Follow-up with PCP ?Additional Notes: Pt. Would like to be seen as soon possible.  ?Answer Assessment - Initial Assessment Questions ?1. ONSET: "When did the swelling start?" (e.g., minutes, hours, days) ?    This weekend ?2. LOCATION: "What part of the face is swollen?" ?    Jaw and eyes are puffy ?3. SEVERITY: "How swollen is it?" ?    Mild ?4. ITCHING: "Is there any itching?" If Yes, ask: "How much?"   (Scale 1-10; mild, moderate or severe) ?    No ?5. PAIN: "Is the swelling painful to touch?" If Yes, ask: "How painful is it?"   (Scale 1-10; mild, moderate or severe) ?  - NONE (0): no pain ?  - MILD (1-3): doesn't interfere with normal activities  ?  - MODERATE (4-7): interferes with normal activities or awakens from sleep  ?  - SEVERE (8-10): excruciating pain, unable to do any normal activities  ?    No ?6. FEVER: "Do you have a fever?" If Yes, ask: "What is it, how was it measured, and when did it start?"  ?    No ?7. CAUSE: "What do you think is causing the face swelling?" ?    CHF ?8. RECURRENT SYMPTOM: "Have you had face swelling before?" If Yes, ask: "When was the last time?" "What happened that time?" ?    Yes ?9. OTHER SYMPTOMS: "Do you have any other symptoms?" (e.g., toothache, leg swelling) ?    Fainted at home yesterday ?10. PREGNANCY: "Is there any chance you are pregnant?" "When was your last menstrual period?" ?      No ? ?Protocols used: Face Swelling-A-AH ? ?

## 2021-09-21 NOTE — Telephone Encounter (Signed)
Scheduled apt at [facility].  ?

## 2021-09-22 ENCOUNTER — Other Ambulatory Visit: Payer: Self-pay

## 2021-09-22 ENCOUNTER — Encounter: Payer: Self-pay | Admitting: Nurse Practitioner

## 2021-09-22 ENCOUNTER — Inpatient Hospital Stay (HOSPITAL_COMMUNITY)
Admission: EM | Admit: 2021-09-22 | Discharge: 2021-09-25 | DRG: 291 | Disposition: A | Discharge status: Home Health | Payer: Medicare Other | Attending: Family Medicine | Admitting: Family Medicine

## 2021-09-22 ENCOUNTER — Ambulatory Visit (INDEPENDENT_AMBULATORY_CARE_PROVIDER_SITE_OTHER): Payer: Medicare Other | Admitting: Nurse Practitioner

## 2021-09-22 ENCOUNTER — Encounter (HOSPITAL_COMMUNITY): Payer: Self-pay | Admitting: Emergency Medicine

## 2021-09-22 ENCOUNTER — Emergency Department (HOSPITAL_COMMUNITY): Payer: Medicare Other

## 2021-09-22 VITALS — BP 126/63 | HR 94 | Resp 84

## 2021-09-22 DIAGNOSIS — Z20822 Contact with and (suspected) exposure to covid-19: Secondary | ICD-10-CM | POA: Diagnosis present

## 2021-09-22 DIAGNOSIS — E1122 Type 2 diabetes mellitus with diabetic chronic kidney disease: Secondary | ICD-10-CM | POA: Diagnosis present

## 2021-09-22 DIAGNOSIS — Z800 Family history of malignant neoplasm of digestive organs: Secondary | ICD-10-CM | POA: Diagnosis not present

## 2021-09-22 DIAGNOSIS — Z8616 Personal history of COVID-19: Secondary | ICD-10-CM

## 2021-09-22 DIAGNOSIS — N1832 Chronic kidney disease, stage 3b: Secondary | ICD-10-CM | POA: Diagnosis present

## 2021-09-22 DIAGNOSIS — Z794 Long term (current) use of insulin: Secondary | ICD-10-CM

## 2021-09-22 DIAGNOSIS — Z79899 Other long term (current) drug therapy: Secondary | ICD-10-CM

## 2021-09-22 DIAGNOSIS — G4733 Obstructive sleep apnea (adult) (pediatric): Secondary | ICD-10-CM

## 2021-09-22 DIAGNOSIS — J449 Chronic obstructive pulmonary disease, unspecified: Secondary | ICD-10-CM | POA: Diagnosis present

## 2021-09-22 DIAGNOSIS — Z743 Need for continuous supervision: Secondary | ICD-10-CM | POA: Diagnosis not present

## 2021-09-22 DIAGNOSIS — K219 Gastro-esophageal reflux disease without esophagitis: Secondary | ICD-10-CM | POA: Diagnosis present

## 2021-09-22 DIAGNOSIS — Z888 Allergy status to other drugs, medicaments and biological substances status: Secondary | ICD-10-CM | POA: Diagnosis not present

## 2021-09-22 DIAGNOSIS — Z8614 Personal history of Methicillin resistant Staphylococcus aureus infection: Secondary | ICD-10-CM

## 2021-09-22 DIAGNOSIS — R609 Edema, unspecified: Secondary | ICD-10-CM | POA: Diagnosis not present

## 2021-09-22 DIAGNOSIS — R296 Repeated falls: Secondary | ICD-10-CM | POA: Diagnosis present

## 2021-09-22 DIAGNOSIS — I130 Hypertensive heart and chronic kidney disease with heart failure and stage 1 through stage 4 chronic kidney disease, or unspecified chronic kidney disease: Secondary | ICD-10-CM | POA: Diagnosis present

## 2021-09-22 DIAGNOSIS — N179 Acute kidney failure, unspecified: Secondary | ICD-10-CM | POA: Diagnosis present

## 2021-09-22 DIAGNOSIS — R6889 Other general symptoms and signs: Secondary | ICD-10-CM | POA: Diagnosis not present

## 2021-09-22 DIAGNOSIS — Z6839 Body mass index (BMI) 39.0-39.9, adult: Secondary | ICD-10-CM

## 2021-09-22 DIAGNOSIS — B182 Chronic viral hepatitis C: Secondary | ICD-10-CM | POA: Diagnosis present

## 2021-09-22 DIAGNOSIS — I5033 Acute on chronic diastolic (congestive) heart failure: Secondary | ICD-10-CM | POA: Insufficient documentation

## 2021-09-22 DIAGNOSIS — J9601 Acute respiratory failure with hypoxia: Secondary | ICD-10-CM

## 2021-09-22 DIAGNOSIS — N184 Chronic kidney disease, stage 4 (severe): Secondary | ICD-10-CM

## 2021-09-22 DIAGNOSIS — D631 Anemia in chronic kidney disease: Secondary | ICD-10-CM | POA: Diagnosis present

## 2021-09-22 DIAGNOSIS — E785 Hyperlipidemia, unspecified: Secondary | ICD-10-CM | POA: Diagnosis present

## 2021-09-22 DIAGNOSIS — I509 Heart failure, unspecified: Secondary | ICD-10-CM

## 2021-09-22 DIAGNOSIS — Z9114 Patient's other noncompliance with medication regimen: Secondary | ICD-10-CM

## 2021-09-22 DIAGNOSIS — Z7951 Long term (current) use of inhaled steroids: Secondary | ICD-10-CM | POA: Diagnosis not present

## 2021-09-22 DIAGNOSIS — R0689 Other abnormalities of breathing: Secondary | ICD-10-CM | POA: Diagnosis not present

## 2021-09-22 DIAGNOSIS — E1140 Type 2 diabetes mellitus with diabetic neuropathy, unspecified: Secondary | ICD-10-CM | POA: Diagnosis present

## 2021-09-22 DIAGNOSIS — D649 Anemia, unspecified: Secondary | ICD-10-CM

## 2021-09-22 LAB — CBC WITH DIFFERENTIAL/PLATELET
Abs Immature Granulocytes: 0.01 10*3/uL (ref 0.00–0.07)
Basophils Absolute: 0 10*3/uL (ref 0.0–0.1)
Basophils Relative: 0 %
Eosinophils Absolute: 0 10*3/uL (ref 0.0–0.5)
Eosinophils Relative: 0 %
HCT: 26.1 % — ABNORMAL LOW (ref 36.0–46.0)
Hemoglobin: 7.9 g/dL — ABNORMAL LOW (ref 12.0–15.0)
Immature Granulocytes: 0 %
Lymphocytes Relative: 24 %
Lymphs Abs: 1 10*3/uL (ref 0.7–4.0)
MCH: 26.5 pg (ref 26.0–34.0)
MCHC: 30.3 g/dL (ref 30.0–36.0)
MCV: 87.6 fL (ref 80.0–100.0)
Monocytes Absolute: 0.4 10*3/uL (ref 0.1–1.0)
Monocytes Relative: 9 %
Neutro Abs: 2.8 10*3/uL (ref 1.7–7.7)
Neutrophils Relative %: 67 %
Platelets: 167 10*3/uL (ref 150–400)
RBC: 2.98 MIL/uL — ABNORMAL LOW (ref 3.87–5.11)
RDW: 14 % (ref 11.5–15.5)
WBC: 4.2 10*3/uL (ref 4.0–10.5)
nRBC: 0 % (ref 0.0–0.2)

## 2021-09-22 LAB — TROPONIN I (HIGH SENSITIVITY)
Troponin I (High Sensitivity): 6 ng/L (ref <18)
Troponin I (High Sensitivity): 5 ng/L (ref <18)

## 2021-09-22 LAB — BASIC METABOLIC PANEL
Anion gap: 9 (ref 5–15)
BUN: 53 mg/dL — ABNORMAL HIGH (ref 6–20)
CO2: 18 mmol/L — ABNORMAL LOW (ref 22–32)
Calcium: 9.1 mg/dL (ref 8.9–10.3)
Chloride: 114 mmol/L — ABNORMAL HIGH (ref 98–111)
Creatinine, Ser: 2.01 mg/dL — ABNORMAL HIGH (ref 0.44–1.00)
GFR, Estimated: 28 mL/min — ABNORMAL LOW (ref >60)
Glucose, Bld: 123 mg/dL — ABNORMAL HIGH (ref 70–99)
Potassium: 5 mmol/L (ref 3.5–5.1)
Sodium: 141 mmol/L (ref 135–145)

## 2021-09-22 LAB — RESP PANEL BY RT-PCR (FLU A&B, COVID) ARPGX2
Influenza A by PCR: NEGATIVE
Influenza B by PCR: NEGATIVE
SARS Coronavirus 2 by RT PCR: NEGATIVE

## 2021-09-22 LAB — CBG MONITORING, ED
Glucose-Capillary: 69 mg/dL — ABNORMAL LOW (ref 70–99)
Glucose-Capillary: 83 mg/dL (ref 70–99)

## 2021-09-22 LAB — FERRITIN: Ferritin: 247 ng/mL (ref 11–307)

## 2021-09-22 LAB — GLUCOSE, CAPILLARY: Glucose-Capillary: 138 mg/dL — ABNORMAL HIGH (ref 70–99)

## 2021-09-22 LAB — BRAIN NATRIURETIC PEPTIDE: B Natriuretic Peptide: 263.9 pg/mL — ABNORMAL HIGH (ref 0.0–100.0)

## 2021-09-22 LAB — RAPID URINE DRUG SCREEN, HOSP PERFORMED
Amphetamines: NOT DETECTED
Barbiturates: NOT DETECTED
Benzodiazepines: NOT DETECTED
Cocaine: NOT DETECTED
Opiates: NOT DETECTED
Tetrahydrocannabinol: NOT DETECTED

## 2021-09-22 MED ORDER — PREGABALIN 75 MG PO CAPS
75.0000 mg | ORAL_CAPSULE | Freq: Two times a day (BID) | ORAL | Status: DC
  Administered 2021-09-22 – 2021-09-25 (×6): 75 mg via ORAL
  Filled 2021-09-22 (×6): qty 1

## 2021-09-22 MED ORDER — INSULIN GLARGINE-YFGN 100 UNIT/ML ~~LOC~~ SOLN
12.0000 [IU] | Freq: Every day | SUBCUTANEOUS | Status: DC
  Administered 2021-09-22: 12 [IU] via SUBCUTANEOUS
  Filled 2021-09-22 (×3): qty 0.12

## 2021-09-22 MED ORDER — MOMETASONE FURO-FORMOTEROL FUM 100-5 MCG/ACT IN AERO
2.0000 | INHALATION_SPRAY | Freq: Two times a day (BID) | RESPIRATORY_TRACT | Status: DC
  Administered 2021-09-22 – 2021-09-25 (×6): 2 via RESPIRATORY_TRACT
  Filled 2021-09-22: qty 8.8

## 2021-09-22 MED ORDER — INSULIN ASPART 100 UNIT/ML IJ SOLN
0.0000 [IU] | Freq: Three times a day (TID) | INTRAMUSCULAR | Status: DC
  Administered 2021-09-23 (×2): 1 [IU] via SUBCUTANEOUS

## 2021-09-22 MED ORDER — FERROUS SULFATE 325 (65 FE) MG PO TABS
325.0000 mg | ORAL_TABLET | Freq: Every day | ORAL | Status: DC
Start: 2021-09-23 — End: 2021-09-22

## 2021-09-22 MED ORDER — CARVEDILOL 12.5 MG PO TABS
12.5000 mg | ORAL_TABLET | Freq: Two times a day (BID) | ORAL | Status: DC
  Administered 2021-09-22 – 2021-09-25 (×6): 12.5 mg via ORAL
  Filled 2021-09-22 (×6): qty 1

## 2021-09-22 MED ORDER — ENOXAPARIN SODIUM 40 MG/0.4ML IJ SOSY
40.0000 mg | PREFILLED_SYRINGE | INTRAMUSCULAR | Status: DC
  Administered 2021-09-22: 40 mg via SUBCUTANEOUS
  Filled 2021-09-22: qty 0.4

## 2021-09-22 MED ORDER — ALBUTEROL SULFATE (2.5 MG/3ML) 0.083% IN NEBU: 3.0000 mL | INHALATION_SOLUTION | RESPIRATORY_TRACT | Status: DC | PRN

## 2021-09-22 MED ORDER — FUROSEMIDE 10 MG/ML IJ SOLN
40.0000 mg | Freq: Once | INTRAMUSCULAR | Status: AC
  Administered 2021-09-22: 40 mg via INTRAVENOUS
  Filled 2021-09-22: qty 4

## 2021-09-22 NOTE — Hospital Course (Addendum)
[name] [name] is a 61 y.o.female with a history of CHF, T2DM, HLD, COPD, CKD 3B, obesity, GERD, HTN, anemia of chronic disease who was admitted to the [facility] Teaching [facility] at [facility] for shortness of breath. Her hospital course is detailed below: ? ?HFpEF exacerbation ?Patient presented to ED after being without home torsemide for 1 week and having oxygen requirement of 2 L with accompanying shortness of breath.  Patient was recently hospitalized from 09/15/2021 to 09/17/2021 due to acute diastolic heart failure.  Echo during that time showed LVEF of 60 to 65% with G2DD.  CXR notable for cardiomegaly with central pulmonary vascular congestion.  Respiratory panel negative.  Troponin normal but BNP elevated at 263.9.  During hospitalization, patient was diuresed with total UOP of 4.86 L. At discharge pt was breathing comfortably on room air. Pt was discharged with home health.  ? ?Diabetes ?Pt had HgbA1c on admission of 5.2. Pt was started and discharges on Jardiance 10 mg daily. Her Lantus was discontinued and she was also discharged on Ozempic 0.25 mg weekly. ? ?Hypertension  AKI ?Home medications of amlodipine 2.5 mg daily, Coreg 12.5 mg twice daily and hydralazine 50 mg 3 times daily were continued during hospitalization.  Spironolactone 25 mg daily was held due to AKI and continued diuresis.  AKI resolved by discharge but spironolactone was held at discharge with instructions to follow-up with primary care to provider before restarting. ? ?Anemia ?Pt had hemoglobin of 8 at discharge with MCV of 84.6 and ferritin WNL at 247. As this appears to be anemia of chronic disease, iron supplements were held and pt was not discharged on iron supplement.  ? ?Other chronic conditions were medically managed with home medications and formulary alternatives as necessary  ? ?PCP Follow-up Recommendations:  ?Continue holding spironolactone and consider restarting at follow up if appropriate (check creatinine)  ?Check  BMP  ?Ensure pt is taking all medications appropriately as she is starting multiple new medications (torsemide, jardiance and ozempic) ?Titrate Ozempic as appropriate  ?Follow up on whether pt truly needs iron supplement  ?Ensure follow-up with cardiology ?

## 2021-09-22 NOTE — Progress Notes (Signed)
Heart Failure Nurse Navigator Progress Note ? ?Following this hospitalization to assess for HV TOC readiness. Pt recently discharged from hospital stay 2/28-3/2 for CHF exacerbation. Planning to admit with FMTS, will attempt to interview tomorrow.  ? ?Pt would benefit from close cardiology follow-up in relation to readmission rate.  ? ?Pricilla Holm, MSN, RN ?Heart Failure Nurse Navigator ?[phone] ? ?

## 2021-09-22 NOTE — ED Provider Notes (Signed)
[facility] EMERGENCY DEPARTMENT Provider Note   CSN: [id number] Arrival date & time: 09/22/21  1149     History  Chief Complaint  Patient presents with   Shortness of Breath    [name] [name] is a 61 y.o. female.  Pt is a 61 yo female with a hx of CHF, anemia, DM2, hyperlipidemia, COPD, CKD, and obesity.  She presents to the ED today with sob.  She does not normally wear home O2, but RA O2 sat for EMS was 84%.  She was put on 2L.  She has had increased swelling in her legs as well.  Pt was admitted from 2/28-3/2 for a CHF exacerbation.  Pt had a follow up visit with her pulmonology today.  They called EMS.  Pt's daughter said she had a sleep apnea test in January.  Pt said they recommended CPAP, but pt said it was never ordered, so she has not been on the CPAP.  Pt's daughter said no one called her to try to set up an in-lab cpap titration.  Pt does not have a "my chart account."   Sleep study result:  IMPRESSIONS - Mild obstructive sleep apnea overall (AHI 13.0/h; RDI 14.6/h); however, sleep apnea was moderately severe during REM sleep (AHI 29.4/h. - Severe oxygen desaturation was noted during this study (Min O2 54.0%). Time spent < 89% was 81.7 minutes. - The patient snored with moderate snoring volume. - No cardiac abnormalities were noted during this study. - Clinically significant periodic limb movements did not occur during sleep. No significant associated arousals.   DIAGNOSIS - Obstructive Sleep Apnea (G47.33) - Nocturnal Hypoxemia (G47.36)   RECOMMENDATIONS - In this symptomatic patient with cardiovascular comorbidities and significant oxygen desaturation recommend an in-lab therapeutic CPAP titration to determine optimal pressure required to alleviate sleep disordered breathing. If unable to obtain an in-lab titration, initiate Auto-PAP with EPR of 3 at 8 - 20 cm of water.  - Effort should be made to optimize nasal and oropharyngeal patency. -  Positional therapy avoiding supine position during sleep. - Avoid alcohol, sedatives and other CNS depressants that may worsen sleep apnea and disrupt normal sleep architecture. - Sleep hygiene should be reviewed to assess factors that may improve sleep quality. - Weight management (BMI 39) and regular exercise should be initiated or continued if appropriate.                   Home Medications Prior to Admission medications   Medication Sig Start Date End Date Taking? Authorizing Provider  albuterol (PROVENTIL) (2.5 MG/3ML) 0.083% nebulizer solution Take 3 mLs (2.5 mg total) by nebulization every 4 (four) hours as needed for wheezing or shortness of breath. Patient not taking: Reported on 09/15/2021 02/12/21   Henderly, Britni A, PA-C  albuterol (VENTOLIN HFA) 108 (90 Base) MCG/ACT inhaler Inhale 1 puff into the lungs every 4 (four) hours as needed for wheezing or shortness of breath. 12/10/20   [name], MD  amLODipine (NORVASC) 2.5 MG tablet Take 1 tablet (2.5 mg total) by mouth daily. 09/18/21   Loletha Grayer, MD  Artificial Tear Ointment (DRY EYES OP) Place 1 drop into both eyes daily as needed (dry eyes).    [provider]  atorvastatin (LIPITOR) 40 MG tablet Take 1 tablet (40 mg total) by mouth every morning. Must have HF appt for further refills Patient not taking: Reported on 09/15/2021 12/09/20   [name], FNP  blood glucose meter kit and supplies  Dispense based on patient and insurance preference. Use up to four times daily as directed. (FOR ICD-9 250.00, 250.01). 04/12/19   Patel, Shreyang, MD  °carvedilol (COREG) 12.5 MG tablet Take 1 tablet (12.5 mg total) by mouth 2 (two) times daily. 07/03/21 10/01/21  Hochrein, James, MD  °ferrous sulfate 325 (65 FE) MG tablet Take 325 mg by mouth daily with breakfast.    [provider]  °hydrALAZINE (APRESOLINE) 50 MG tablet TAKE ONE TABLET BY MOUTH THREE TIMES DAILY 07/09/21   Newlin, Enobong, MD  °insulin  aspart (NOVOLOG) cartridge Inject 4 Units into the skin 3 (three) times daily with meals. 09/17/21   Wieting, Richard, MD  °insulin glargine (LANTUS SOLOSTAR) 100 UNIT/ML Solostar Pen Inject 25 Units into the skin at bedtime. 09/17/21   Wieting, Richard, MD  °Insulin Pen Needle 33G X 5 MM MISC 1 Dose by Does not apply route 3 (three) times daily before meals. 09/17/21   Wieting, Richard, MD  °mometasone-formoterol (DULERA) 100-5 MCG/ACT AERO Inhale 2 puffs into the lungs 2 (two) times daily. 09/17/21   Wieting, Richard, MD  °nystatin ointment (MYCOSTATIN) Apply 1 application topically 2 (two) times daily. 08/25/21   Newlin, Enobong, MD  °pregabalin (LYRICA) 75 MG capsule TAKE ONE CAPSULE BY MOUTH TWICE DAILY 09/08/21   Newlin, Enobong, MD  °spironolactone (ALDACTONE) 25 MG tablet Take 1 tablet (25 mg total) by mouth daily. 04/21/21   Newlin, Enobong, MD  °torsemide (DEMADEX) 20 MG tablet Two tabs po every morning 09/17/21   Wieting, Richard, MD  °   ° °Allergies    °Lisinopril   ° °Review of Systems   °Review of Systems  °Respiratory:  Positive for shortness of breath.   °Cardiovascular:  Positive for leg swelling.  °All other systems reviewed and are negative. ° °Physical Exam °Updated Vital Signs °BP (!) 146/74 (BP Location: Right Arm)    Pulse 89    Temp 98.9 °F (37.2 °C) (Oral)    Resp (!) 21    LMP 04/01/2010    SpO2 97%  °Physical Exam °Vitals and nursing note reviewed.  °Constitutional:   °   Appearance: She is well-developed. She is obese.  °HENT:  °   Head: Normocephalic and atraumatic.  °   Mouth/Throat:  °   Mouth: Mucous membranes are moist.  °   Pharynx: Oropharynx is clear.  °Eyes:  °   Extraocular Movements: Extraocular movements intact.  °   Pupils: Pupils are equal, round, and reactive to light.  °Cardiovascular:  °   Rate and Rhythm: Normal rate and regular rhythm.  °Pulmonary:  °   Effort: Tachypnea present.  °   Breath sounds: Rhonchi present.  °Abdominal:  °   Palpations: Abdomen is soft.  °Musculoskeletal:      °   General: Normal range of motion.  °   Cervical back: Normal range of motion and neck supple.  °   Right lower leg: Edema present.  °   Left lower leg: Edema present.  °Skin: °   General: Skin is warm and dry.  °   Capillary Refill: Capillary refill takes less than 2 seconds.  °Neurological:  °   General: No focal deficit present.  °   Mental Status: She is alert and oriented to person, place, and time.  °Psychiatric:     °   Mood and Affect: Mood normal.     °   Behavior: Behavior normal.  ° ° °ED Results / Procedures / Treatments   °  Labs °(all labs ordered are listed, but only abnormal results are displayed) °Labs Reviewed  °BASIC METABOLIC PANEL - Abnormal; Notable for the following components:  °    Result Value  ° Chloride 114 (*)   ° CO2 18 (*)   ° Glucose, Bld 123 (*)   ° BUN 53 (*)   ° Creatinine, Ser 2.01 (*)   ° GFR, Estimated 28 (*)   ° All other components within normal limits  °CBC WITH DIFFERENTIAL/PLATELET - Abnormal; Notable for the following components:  ° RBC 2.98 (*)   ° Hemoglobin 7.9 (*)   ° HCT 26.1 (*)   ° All other components within normal limits  °BRAIN NATRIURETIC PEPTIDE - Abnormal; Notable for the following components:  ° B Natriuretic Peptide 263.9 (*)   ° All other components within normal limits  °RESP PANEL BY RT-PCR (FLU A&B, COVID) [id number]  °TROPONIN I (HIGH SENSITIVITY)  °TROPONIN I (HIGH SENSITIVITY)  ° ° °EKG °EKG Interpretation ° °Date/Time:  Tuesday September 22 2021 12:14:57 EST °Ventricular Rate:  89 °PR Interval:  143 °QRS Duration: 95 °QT Interval:  362 °QTC Calculation: 441 °R Axis:   13 °Text Interpretation: Sinus rhythm No significant change since last tracing Confirmed by Haviland, Julie ([id number]) on 09/22/2021 12:17:01 PM ° °Radiology °DG Chest Port 1 View ° °Result Date: 09/22/2021 °CLINICAL DATA:  Provided history: Shortness of breath. Additional history provided: Fluid retention and shortness of breath. EXAM: PORTABLE CHEST 1 VIEW COMPARISON:  Prior chest radiographs  09/15/2021 and earlier. FINDINGS: Cardiomegaly. Central pulmonary vascular congestion without overt pulmonary edema. No appreciable airspace consolidation. No evidence of pleural effusion or pneumothorax. No acute bony abnormality identified. IMPRESSION: Cardiomegaly with central pulmonary vascular congestion. No overt pulmonary edema or appreciable airspace consolidation. Electronically Signed   By: Kyle  Golden D.O.   On: 09/22/2021 12:21   ° °Procedures °Procedures  ° ° °Medications Ordered in ED °Medications  °furosemide (LASIX) injection 40 mg (has no administration in time range)  ° ° °ED Course/ Medical Decision Making/ A&P °  °                        °Medical Decision Making °Amount and/or Complexity of Data Reviewed °Labs: ordered. °Radiology: ordered. ° °Risk °Prescription drug management. °Decision regarding hospitalization. ° ° °This patient presents to the ED for concern of sob, this involves an extensive number of treatment options, and is a complaint that carries with it a high risk of complications and morbidity.  The differential diagnosis includes chf, copd, anemia ° ° °Co morbidities that complicate the patient evaluation ° °CHF, anemia, DM2, hyperlipidemia, COPD, CKD, and obesity ° ° °Additional history obtained: ° °Additional history obtained from epic chart review °External records from outside source obtained and reviewed including EMS report ° ° °Lab Tests: ° °I Ordered, and personally interpreted labs.  The pertinent results include:  hgb 7.9.  This is stable, but may be contributing to sob.  Hbg in the upper 9s last year.  CKD is chronic.  BNP is 263.9.  Covid/flu neg ° ° °Imaging Studies ordered: ° °I ordered imaging studies including cxr  °I independently visualized and interpreted imaging which showed  °  °IMPRESSION:  °Cardiomegaly with central pulmonary vascular congestion.  °   °No overt pulmonary edema or appreciable airspace consolidation.  ° °I agree with the radiologist  interpretation ° ° °Cardiac Monitoring: ° °The patient was maintained on a cardiac monitor.  I personally viewed   and interpreted the cardiac monitored which showed an underlying rhythm of: nsr ° ° °Medicines ordered and prescription drug management: ° °I ordered medication including lasix  for chf  °Reevaluation of the patient after these medicines showed that the patient improved °I have reviewed the patients home medicines and have made adjustments as needed ° ° °Test Considered: ° °Ct chest:  pt has ckd and is not tachycardic.  Pe unlikely. ° ° °Critical Interventions: ° °Oxygen, lasix ° ° °Consultations Obtained: ° °I requested consultation with the FP residents,  and discussed lab and imaging findings as well as pertinent plan - they will admit ° ° °Problem List / ED Course: ° °CHF exac:  IV lasix given.  Oxygen on 2L keeping O2 sats mid-90s. °Chronic anemia:  Likely from ckd °OSA:  needs cpap ° ° °Reevaluation: ° °After the interventions noted above, I reevaluated the patient and found that they have :improved ° ° °Social Determinants of Health: ° °Lives at home.  Daughter is now living in [place] and is helping mom with her health.  I showed the daughter how to access my chart. ° ° °Dispostion: ° °After consideration of the diagnostic results and the patients response to treatment, I feel that the patent would benefit from admission.   ° °CRITICAL CARE °Performed by: Julie Haviland ° ° °Total critical care time: 30 minutes ° °Critical care time was exclusive of separately billable procedures and treating other patients. ° °Critical care was necessary to treat or prevent imminent or life-threatening deterioration. ° °Critical care was time spent personally by me on the following activities: development of treatment plan with patient and/or surrogate as well as nursing, discussions with consultants, evaluation of patient's response to treatment, examination of patient, obtaining history from patient or surrogate,  ordering and performing treatments and interventions, ordering and review of laboratory studies, ordering and review of radiographic studies, pulse oximetry and re-evaluation of patient's condition.  ° ° ° ° ° ° ° °Final Clinical Impression(s) / ED Diagnoses °Final diagnoses:  °Acute on chronic congestive heart failure, unspecified heart failure type (HCC)  °Acute respiratory failure with hypoxia (HCC)  °OSA (obstructive sleep apnea)  °CKD (chronic kidney disease) stage 4, GFR 15-29 ml/min (HCC)  °Chronic anemia  ° ° °Rx / DC Orders °ED Discharge Orders   ° ° None  ° °  ° ° °  °Haviland, Julie, MD °09/22/21 1422 ° °

## 2021-09-22 NOTE — Patient Instructions (Signed)
Acute on chronic congestive heart failure, unspecified heart failure type ([place]) ?  ? ?Patient presents in acute distress.  O2 sats 81% on room air.  EMS called to transport patient to hospital.  Patient was recently discharged from the hospital for congestive heart failure.  This is most likely acute on chronic heart failure today. ? ?Follow up after hospital discharge ?

## 2021-09-22 NOTE — Progress Notes (Signed)
_0  ID: [name] [name], female    DOB: 09-07-1960, 61 y.o.   MRN: [id number]  Chief Complaint  Patient presents with   Shortness of Breath    Referring provider: [name], MD  HPI  Patient presents today for sick visit.  Upon arrival to office patient is extremely short of breath and can barely make it down the hallway to the exam room.  She is unable to tolerate due to extreme shortness of breath.  Her O2 sats were 81%.  Patient was recently discharged from the hospital.  She was hospitalized for congestive heart failure.  She was discharged home with no oxygen.  2 L of oxygen was placed on patient office and EMS was called to transport patient to the hospital. Denies f/c/s, n/v/d, hemoptysis.     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   Outpatient Encounter Medications as of 09/22/2021  Medication Sig   albuterol (PROVENTIL) (2.5 MG/3ML) 0.083% nebulizer solution Take 3 mLs (2.5 mg  total) by nebulization every 4 (four) hours as needed for wheezing or shortness of breath. (Patient not taking: Reported on 09/15/2021)   albuterol (VENTOLIN HFA) 108 (90 Base) MCG/ACT inhaler Inhale 1 puff into the lungs every 4 (four) hours as needed for wheezing or shortness of breath.   amLODipine (NORVASC) 2.5 MG tablet Take 1 tablet (2.5 mg total) by mouth daily.   Artificial Tear Ointment (DRY EYES OP) Place 1 drop into both eyes daily as needed (dry eyes).   atorvastatin (LIPITOR) 40 MG tablet Take 1 tablet (40 mg total) by mouth every morning. Must have HF appt for further refills (Patient not taking: Reported on 09/15/2021)   blood glucose meter kit and supplies KIT Dispense based on patient and insurance preference. Use up to four times daily as directed. (FOR ICD-9 250.00, 250.01).   carvedilol (COREG) 12.5 MG tablet Take 1 tablet (12.5 mg total) by mouth 2 (two) times daily.   ferrous sulfate 325 (65 FE) MG tablet Take 325 mg by mouth daily with breakfast.   hydrALAZINE (APRESOLINE) 50 MG tablet TAKE ONE TABLET BY MOUTH THREE TIMES DAILY   insulin aspart (NOVOLOG) cartridge Inject 4 Units into the skin 3 (three) times daily with meals.   insulin glargine (LANTUS SOLOSTAR) 100 UNIT/ML Solostar Pen Inject 25 Units into the skin at bedtime.   Insulin Pen Needle 33G X 5 MM MISC 1 Dose by Does not apply route 3 (three) times daily before meals.   mometasone-formoterol (  DULERA) 100-5 MCG/ACT AERO Inhale 2 puffs into the lungs 2 (two) times daily.   nystatin ointment (MYCOSTATIN) Apply 1 application topically 2 (two) times daily.   pregabalin (LYRICA) 75 MG capsule TAKE ONE CAPSULE BY MOUTH TWICE DAILY   spironolactone (ALDACTONE) 25 MG tablet Take 1 tablet (25 mg total) by mouth daily.   torsemide (DEMADEX) 20 MG tablet Two tabs po every morning     Review of Systems  Review of Systems  Constitutional: Negative.   HENT: Negative.    Respiratory:  Positive for cough and shortness of  breath.   Cardiovascular: Negative.   Gastrointestinal: Negative.   Allergic/Immunologic: Negative.   Neurological: Negative.   Psychiatric/Behavioral: Negative.        Physical Exam  BP 126/63    Pulse 94    Resp (!) 84    LMP 04/01/2010   Wt Readings from Last 5 Encounters:  09/16/21 240 lb 15.4 oz (109.3 kg)  09/14/21 246 lb (111.6 kg)  09/07/21 249 lb 4.8 oz (113.1 kg)  08/25/21 237 lb 12.8 oz (107.9 kg)  08/18/21 240 lb (108.9 kg)     Physical Exam Vitals and nursing note reviewed.  Constitutional:      General: She is not in acute distress.    Appearance: She is well-developed.  Cardiovascular:     Rate and Rhythm: Normal rate and regular rhythm.  Pulmonary:     Effort: Respiratory distress present.     Breath sounds: Rhonchi present.  Neurological:     Mental Status: She is alert and oriented to person, place, and time.     Lab Results:  CBC    Component Value Date/Time   WBC 4.2 09/22/2021 1209   RBC 2.98 (L) 09/22/2021 1209   HGB 7.9 (L) 09/22/2021 1209   HGB 9.8 (L) 07/10/2019 1544   HCT 26.1 (L) 09/22/2021 1209   HCT 30.9 (L) 07/10/2019 1544   PLT 167 09/22/2021 1209   PLT 171 07/10/2019 1544   MCV 87.6 09/22/2021 1209   MCV 86 07/10/2019 1544   MCH 26.5 09/22/2021 1209   MCHC 30.3 09/22/2021 1209   RDW 14.0 09/22/2021 1209   RDW 12.7 07/10/2019 1544   LYMPHSABS 1.0 09/22/2021 1209   LYMPHSABS 1.3 07/10/2019 1544   MONOABS 0.4 09/22/2021 1209   EOSABS 0.0 09/22/2021 1209   EOSABS 0.0 07/10/2019 1544   BASOSABS 0.0 09/22/2021 1209   BASOSABS 0.0 07/10/2019 1544    BMET    Component Value Date/Time   NA 141 09/22/2021 1209   NA 144 07/03/2021 1522   K 5.0 09/22/2021 1209   CL 114 (H) 09/22/2021 1209   CO2 18 (L) 09/22/2021 1209   GLUCOSE 123 (H) 09/22/2021 1209   BUN 53 (H) 09/22/2021 1209   BUN 21 07/03/2021 1522   CREATININE 2.01 (H) 09/22/2021 1209   CREATININE 0.88 12/12/2012 1349   CALCIUM 9.1 09/22/2021 1209   GFRNONAA 28  (L) 09/22/2021 1209   GFRAA 38 (L) 11/08/2019 0435    BNP    Component Value Date/Time   BNP 263.9 (H) 09/22/2021 1209    ProBNP No results found for: PROBNP  Imaging: DG Chest Port 1 View  Result Date: 09/22/2021 CLINICAL DATA:  Provided history: Shortness of breath. Additional history provided: Fluid retention and shortness of breath. EXAM: PORTABLE CHEST 1 VIEW COMPARISON:  Prior chest radiographs 09/15/2021 and earlier. FINDINGS: Cardiomegaly. Central pulmonary vascular congestion without overt pulmonary edema. No appreciable airspace consolidation. No evidence of pleural  effusion or pneumothorax. No acute bony abnormality identified. IMPRESSION: Cardiomegaly with central pulmonary vascular congestion. No overt pulmonary edema or appreciable airspace consolidation. Electronically Signed   By: Kellie Simmering D.O.   On: 09/22/2021 12:21   DG Chest Port 1 View  Result Date: 09/15/2021 CLINICAL DATA:  Shortness of breath for the past 3 weeks. Chest pain. EXAM: PORTABLE CHEST 1 VIEW COMPARISON:  Chest x-ray dated May 05, 2021. FINDINGS: Unchanged cardiomegaly. New mild pulmonary vascular congestion with increased interstitial markings at the lung bases. No focal consolidation, pleural effusion, or pneumothorax. No acute osseous abnormality. IMPRESSION: 1. New mild interstitial pulmonary edema. Electronically Signed   By: Titus Dubin M.D.   On: 09/15/2021 13:16   ECHOCARDIOGRAM COMPLETE  Result Date: 09/16/2021    ECHOCARDIOGRAM REPORT   Patient Name:   NELLENE COURTOIS Date of Exam: 09/16/2021 Medical Rec #:  [id number]        Height:       64.0 in Accession #:    [id number]       Weight:       240.5 lb Date of Birth:  Jul 17, 1961        BSA:          2.116 m Patient Age:    72 years         BP:           134/66 mmHg Patient Gender: F                HR:           87 bpm. Exam Location:  [facility] Procedure: 2D Echo, Cardiac Doppler and Color Doppler Indications:     CHF --acute diastolic H88.50   History:         Patient has prior history of Echocardiogram examinations, most                  recent 04/02/2021. CHF; Risk Factors:Hypertension and Diabetes.  Sonographer:     Sherrie Sport Referring Phys:  [id number] Emeterio Reeve Diagnosing Phys: Kate Sable MD  Sonographer Comments: Suboptimal apical window and suboptimal parasternal window. IMPRESSIONS  1. Left ventricular ejection fraction, by estimation, is 60 to 65%. The left ventricle has normal function. The left ventricle has no regional wall motion abnormalities. Left ventricular diastolic parameters are consistent with Grade II diastolic dysfunction (pseudonormalization).  2. Right ventricular systolic function is normal. The right ventricular size is normal.  3. Left atrial size was mildly dilated.  4. The mitral valve is normal in structure. No evidence of mitral valve regurgitation.  5. The aortic valve is normal in structure. Aortic valve regurgitation is not visualized. FINDINGS  Left Ventricle: Left ventricular ejection fraction, by estimation, is 60 to 65%. The left ventricle has normal function. The left ventricle has no regional wall motion abnormalities. The left ventricular internal cavity size was normal in size. There is  no left ventricular hypertrophy. Left ventricular diastolic parameters are consistent with Grade II diastolic dysfunction (pseudonormalization). Right Ventricle: The right ventricular size is normal. No increase in right ventricular wall thickness. Right ventricular systolic function is normal. Left Atrium: Left atrial size was mildly dilated. Right Atrium: Right atrial size was normal in size. Pericardium: There is no evidence of pericardial effusion. Mitral Valve: The mitral valve is normal in structure. No evidence of mitral valve regurgitation. MV peak gradient, 12.2 mmHg. The mean mitral valve gradient is 6.0 mmHg. Tricuspid Valve: The tricuspid valve is normal in structure. Tricuspid  valve regurgitation is mild.  Aortic Valve: The aortic valve is normal in structure. Aortic valve regurgitation is not visualized. Aortic valve mean gradient measures 6.0 mmHg. Aortic valve peak gradient measures 12.1 mmHg. Aortic valve area, by VTI measures 2.83 cm. Pulmonic Valve: The pulmonic valve was normal in structure. Pulmonic valve regurgitation is not visualized. Aorta: The aortic root and ascending aorta are structurally normal, with no evidence of dilitation. Venous: The inferior vena cava was not well visualized. IAS/Shunts: No atrial level shunt detected by color flow Doppler.  LEFT VENTRICLE PLAX 2D LVIDd:         4.30 cm   Diastology LVIDs:         2.60 cm   LV e' medial:    4.90 cm/s LV PW:         1.00 cm   LV E/e' medial:  29.0 LV IVS:        1.30 cm   LV e' lateral:   8.05 cm/s LVOT diam:     2.10 cm   LV E/e' lateral: 17.6 LV SV:         85 LV SV Index:   40 LVOT Area:     3.46 cm  RIGHT VENTRICLE RV S prime:     14.80 cm/s TAPSE (M-mode): 3.2 cm LEFT ATRIUM             Index        RIGHT ATRIUM           Index LA diam:        4.60 cm 2.17 cm/m   RA Area:     16.40 cm LA Vol (A2C):   93.2 ml 44.05 ml/m  RA Volume:   43.70 ml  20.65 ml/m LA Vol (A4C):   75.2 ml 35.54 ml/m LA Biplane Vol: 87.0 ml 41.12 ml/m  AORTIC VALVE AV Area (Vmax):    2.87 cm AV Area (Vmean):   2.48 cm AV Area (VTI):     2.83 cm AV Vmax:           174.00 cm/s AV Vmean:          115.000 cm/s AV VTI:            0.301 m AV Peak Grad:      12.1 mmHg AV Mean Grad:      6.0 mmHg LVOT Vmax:         144.00 cm/s LVOT Vmean:        82.500 cm/s LVOT VTI:          0.246 m LVOT/AV VTI ratio: 0.82  AORTA Ao Root diam: 2.80 cm MITRAL VALVE                TRICUSPID VALVE MV Area (PHT): 2.84 cm     TR Peak grad:   47.6 mmHg MV Area VTI:   1.83 cm     TR Vmax:        345.00 cm/s MV Peak grad:  12.2 mmHg MV Mean grad:  6.0 mmHg     SHUNTS MV Vmax:       1.75 m/s     Systemic VTI:  0.25 m MV Vmean:      119.0 cm/s   Systemic Diam: 2.10 cm MV Decel Time: 267 msec  MV E velocity: 142.00 cm/s MV A velocity: 140.00 cm/s MV E/A ratio:  1.01 Kate Sable MD Electronically signed by Kate Sable MD Signature Date/Time: 09/16/2021/12:48:45 PM    Final  Assessment & Plan:   Acute on chronic congestive heart failure [facility]) Patient presents in acute distress.  O2 sats 81% on room air.  EMS called to transport patient to hospital.  Patient was recently discharged from the hospital for congestive heart failure.  This is most likely acute on chronic heart failure today.  Follow up after hospital discharge     Fenton Foy, NP 09/22/2021

## 2021-09-22 NOTE — ED Triage Notes (Signed)
Patient BIB [facility] from PCP for evaluation of fluid retention and shortness of breath. Patient states she was told during a previous visit that she needed home O2 but states it was never prescribed. EMS reports room SpO2 of 84%. Place on 2L [place], SpO2 increased to 95%. Edema in bilateral lower extremity edema. Patient alert, oriented, and in no apparent distress at this time. ? ?BP 140/80 ?HR 90 NSR ?RR 26 ?SpO2 95% on 2L [place] O2 ?

## 2021-09-22 NOTE — H&P (Signed)
[facility] Admission History and Physical Service Pager: [phone]  Patient name: [name] [name] Medical record number: [id number] Date of birth: January 22, 1961 Age: 61 y.o. Gender: female  Primary Care Provider: [name], MD Consultants: none Code Status: Full code Preferred Emergency Contact: Extended Emergency Contact Information Primary Emergency Contact: Ray,Ebony  [place] Mobile Phone: [phone] Relation: Daughter Secondary Emergency Contact: [facility] Address: [address]          [place], [place] [address] Johnnette Litter of [place] Phone: [phone] Mobile Phone: [phone] Relation: Friend   Chief Complaint: SOB  Assessment and Plan: [name] [name] is a 61 y.o. female presenting with SOB . PMH is significant for CHF*, anemia, T2DM, HLD, COPD, CKD IIIb, obesity, GERD, HTN, anemia of chronic disease  Shortness of breath likely due to HFpEF exacerbation Patient was recently hospitalized from 09/15/2021-09/17/2021 for hypoxemia due to acute diastolic heart failure.  Echo from 09/16/2021 showed LVEF of 60 to 65% with grade 2 diastolic dysfunction.  She was treated with supplemental oxygen, diuresed with IV Lasix and was discharged on room air and instructed to take torsemide 40 mg daily however pt was unable to get prescription and has not taken the torsemide.  Was sent here from pulmonology apt today due to hypoxemia and shortness of breath.  EMS put patient on 2 L due to oxygen saturation of 84%.  Currently patient has mild work of breathing on room air with oxygen saturations in the high 90s.  In ED, patient had BNP of 263, WBC of 4.2, troponin of 6, and chest x-ray showed no overt pulmonary edema or airspace consolidation.  Given labs, CXR, stable vitals and physical exam, shortness of breath is unlikely due to infection MS likely due to HFpEF exacerbation in the setting of medication nonadherence. Pt appears  fluid overloaded on exam. Patient received 40 mg IV Lasix in the ED. -Admit to med telemetry, attending Dr. Andria Frames - Patient received 1 dose of Lasix in the emergency department, will monitor and redose Lasix as needed -Strict I's and O's -Daily weights -Continuous cardiac monitoring -Continuous pulse ox -AM BMP - PT/OT eval and treat - O2 as needed to keep pulse ox greater than 92% - Heart failure coordinator referral placed, patient has appointment scheduled for March 10 which she was unaware of.  Possible syncopal episode Patient had 3 episodes of "falling out "since her last hospitalization.  Unable to confirm if she lost consciousness, hit her head.  1 episode she describes sounds like orthostatic hypotension.  EKG shows normal sinus rhythm and last echo from 09/16/2021 showed preserved ejection fraction with grade 2 diastolic dysfunction.  Episodes unlikely due to arrhythmia.  Episodes could possibly be due to hypoglycemia although patient states she has been checking her sugars every day and they have been between 120-180.  Will obtain orthostatics -Orthostatic vitals   HTN Home medications listed include amlodipine 2.5 mg daily, carvedilol 12.5 mg twice daily, hydralazine 50 mg 3 times daily, spironolactone 25 mg daily.  Patient is unsure of medications, daughter is bring in meds for formal med rec.last BP at 1500 of 115/67 -Continue home coreg  -Hold other home meds until med rec complete  OSA Patient had sleep study done in January 2023 which showed mild obstructive sleep apnea.  Patient said the CPAP was recommended but it was not ordered so she has not been using CPAP. -CPAP nightly  COPD Patient confirms home medications include albuterol every 4 hours as  needed, Dulera 2 puffs twice daily. -Continue home meds  T2DM w/ diabetic neuropathy Home medication includes Lantus 25U.  Last A1c on 07/03/2021 of 8.6.  Patient is on Lyrica for the diabetic neuropathy. -f/u  HgbA1c -Semglee 12 units -Sensitive SSI -CBGs 4 times daily with meals and at bedtime - Continue Lyrica 75 mg twice daily pending med rec  Anemia of chronic disease/iron deficiency anemia? Hemoglobin of 7.9, baseline appears to be around 9.  Patient takes ferrous sulfate 325 mg daily with breakfast at home. - Ferritin ordered with morning labs -Consider restarting supplemental iron once ferritin has been checked -AM CBC  AKI on CKD stage IIIb Creatinine of 2.01 today, baseline appears to be 1.4-1.7. GFR is 28.  -AM BMP -Avoid nephrotoxic medications   FEN/GI: Heart healthy Prophylaxis: Lovenox  Disposition: Med telemetry  History of Present Illness:  [name] [name] is a 61 y.o. female presenting with SOB  Pt states she started feeling SOB 2 days after leaving hospital on 09/17/2021, states that she never received her torsemide.  States she "fell out" on 3 different occasions, 1st time at funeral  several days ago, 2nd and 3rd time time 2 days ago.  Unable to confirm if she lost consciousness, felt lightheaded, or hit her head.  Patient did state she had stood up and started walking before "falling out" on 1 occasion.  Went to pulm apt today. L sided CP intermittent for weeks, sharp, doesn't radiate.  Checks sugars in mornings in 120's-180s   Review Of Systems: Per HPI with the following additions:   Review of Systems  Constitutional:  Positive for appetite change, chills and fatigue. Negative for fever.  HENT:  Positive for congestion and rhinorrhea. Negative for sore throat.   Respiratory:  Positive for chest tightness and shortness of breath.   Cardiovascular:  Positive for chest pain and leg swelling. Negative for palpitations.  Gastrointestinal:  Positive for constipation.  Genitourinary:  Negative for difficulty urinating and dysuria.  Neurological:  Positive for dizziness and light-headedness. Negative for headaches.    Patient Active Problem List   Diagnosis Date  Noted   Acute on chronic congestive heart failure ([place]) 09/22/2021   CKD (chronic kidney disease) stage 4, GFR 15-29 ml/min ([place]) 07/03/2021   Acute respiratory failure due to COVID-19 ([place]) 05/06/2021   COVID-19 05/05/2021   CHF exacerbation ([place]) 04/02/2021   Chest pain 04/02/2021   Acute-on-chronic kidney injury ([place]) 04/02/2021   COPD exacerbation ([place]) 12/05/2020   CKD (chronic kidney disease), stage III ([place]) 12/04/2020   Anemia of chronic disease 12/04/2020   Obesity, Class III, BMI 40-49.9 (morbid obesity) ([place]) 07/27/2020   Acute respiratory disease due to COVID-19 virus 07/25/2020   Acute hypoxemic respiratory failure due to COVID-19 ([place]) 07/25/2020   Morbid obesity ([place]) 11/23/2019   Acute respiratory failure with hypoxia ([place]) 11/06/2019   Chronic kidney disease, stage 3b ([place]) 11/06/2019   COPD without exacerbation ([place])    Acute kidney failure ([place]) 08/18/2019   Left ventricular hypertrophy 07/21/2019   Educated about COVID-19 virus infection 09/47/0962   Acute diastolic HF (heart failure) ([place])    Hypoxia    Sleep apnea 05/09/2019   Hyperglycemia 04/09/2019   Snoring 03/13/2019   Symptomatic anemia 08/17/2018   Chronic cystitis 08/03/2018   Diabetic neuropathy ([place]) 06/16/2017   Non compliance w medication regimen 04/12/2017   Family history of colon cancer in mother 11/17/2016   Dyspareunia in female 11/11/2016   Screen for colon cancer 11/11/2016  History of ovarian cyst 11/11/2016   Chronic hepatitis C without hepatic coma ([place]) 11/09/2016   Hyperlipidemia 10/07/2016   Type 2 diabetes mellitus with hyperlipidemia ([place]) 01/29/2014   Status post intraocular lens implant 11/08/2013   PCO (posterior capsular opacification) 09/28/2013   Pseudophakia 09/12/2013   GERD (gastroesophageal reflux disease) 09/06/2013   Hypertension 09/06/2013   ANEMIA, IRON DEFICIENCY 10/03/2009   LIPOMA 06/18/2009   TINEA PEDIS 05/07/2009   SUBSTANCE ABUSE, MULTIPLE 02/22/2007    HCVD (hypertensive cardiovascular disease) 02/22/2007    Past Medical History: Past Medical History:  Diagnosis Date   Anemia    CHF (congestive heart failure) ([place])    Chronic hepatitis C without hepatic coma ([place]) 11/09/2016   Diabetes mellitus    Fibroids    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   Qualifier: Diagnosis of  By: Jorene Minors, [place]     Hypertension    MRSA (methicillin resistant Staphylococcus aureus)    Trichomonas    VAGINITIS, BACTERIAL, RECURRENT 08/15/2007   Qualifier: Diagnosis of  By: Radene Ou MD, [place]      Past Surgical History: Past Surgical History:  Procedure Laterality Date   BREAST BIOPSY Left 2018   CESAREAN SECTION     breech    Social History: Social History   Tobacco Use   Smoking status: Never   Smokeless tobacco: Never  Vaping Use   Vaping Use: Never used  Substance Use Topics   Alcohol use: No   Drug use: Not Currently    Types: Cocaine, Marijuana    Comment: remote h/o cocaine 2015 and marijuana 2018     Family History: Family History  Problem Relation Age of Onset   Colon cancer Mother    Liver disease Sister    Other Neg Hx    Breast cancer Neg Hx    Esophageal cancer Neg Hx    Rectal cancer Neg Hx      Allergies and Medications: Allergies  Allergen Reactions   Lisinopril Swelling   No current facility-administered medications on file prior to encounter.   Current Outpatient Medications on File Prior to Encounter  Medication Sig Dispense Refill   albuterol (PROVENTIL) (2.5 MG/3ML) 0.083% nebulizer solution Take 3 mLs (2.5 mg total) by nebulization every 4 (four) hours as needed for wheezing or shortness of breath. (Patient not taking: Reported on 09/15/2021) 75 mL 0   albuterol (VENTOLIN HFA) 108 (90 Base) MCG/ACT inhaler Inhale 1 puff into the lungs every 4 (four) hours as needed for wheezing or shortness of breath. 18 g 0   amLODipine (NORVASC) 2.5 MG tablet Take 1 tablet (2.5 mg total) by mouth daily. 30 tablet 0   Artificial  Tear Ointment (DRY EYES OP) Place 1 drop into both eyes daily as needed (dry eyes).     atorvastatin (LIPITOR) 40 MG tablet Take 1 tablet (40 mg total) by mouth every morning. Must have HF appt for further refills (Patient not taking: Reported on 09/15/2021) 90 tablet 0   blood glucose meter kit and supplies KIT Dispense based on patient and insurance preference. Use up to four times daily as directed. (FOR ICD-9 250.00, 250.01). 1 each 2   carvedilol (COREG) 12.5 MG tablet Take 1 tablet (12.5 mg total) by mouth 2 (two) times daily. 180 tablet 3   ferrous sulfate 325 (65 FE) MG tablet Take 325 mg by mouth daily with breakfast.     hydrALAZINE (APRESOLINE) 50 MG tablet TAKE ONE TABLET BY MOUTH THREE TIMES DAILY 270 tablet 1  insulin aspart (NOVOLOG) cartridge Inject 4 Units into the skin 3 (three) times daily with meals. 15 mL 0   insulin glargine (LANTUS SOLOSTAR) 100 UNIT/ML Solostar Pen Inject 25 Units into the skin at bedtime. 15 mL 0   Insulin Pen Needle 33G X 5 MM MISC 1 Dose by Does not apply route 3 (three) times daily before meals. 100 each 1   mometasone-formoterol (DULERA) 100-5 MCG/ACT AERO Inhale 2 puffs into the lungs 2 (two) times daily. 1 each 0   nystatin ointment (MYCOSTATIN) Apply 1 application topically 2 (two) times daily. 30 g 2   pregabalin (LYRICA) 75 MG capsule TAKE ONE CAPSULE BY MOUTH TWICE DAILY 60 capsule 4   spironolactone (ALDACTONE) 25 MG tablet Take 1 tablet (25 mg total) by mouth daily. 90 tablet 1   torsemide (DEMADEX) 20 MG tablet Two tabs po every morning 60 tablet 0    Objective: BP (!) 146/74 (BP Location: Right Arm)    Pulse 89    Temp 98.9 F (37.2 C) (Oral)    Resp (!) 21    LMP 04/01/2010    SpO2 97%  Physical Exam Vitals reviewed.  Constitutional:      Appearance: She is obese.  HENT:     Head: Normocephalic and atraumatic.     Mouth/Throat:     Mouth: Mucous membranes are moist.  Eyes:     Extraocular Movements: Extraocular movements intact.      Pupils: Pupils are equal, round, and reactive to light.  Neck:     Vascular: No JVD.  Cardiovascular:     Rate and Rhythm: Normal rate and regular rhythm.  Pulmonary:     Effort: Pulmonary effort is normal.     Breath sounds: No wheezing.     Comments: Crackles noted in lung bases bilaterally Chest:     Chest wall: No mass or tenderness.  Abdominal:     General: Bowel sounds are normal.     Palpations: Abdomen is soft.  Musculoskeletal:        General: Normal range of motion.     Cervical back: Normal range of motion and neck supple.     Right lower leg: Edema present.     Left lower leg: Edema present.     Comments: +2 pitting edema in lower extremities bilaterally  Skin:    General: Skin is warm.     Capillary Refill: Capillary refill takes less than 2 seconds.  Neurological:     General: No focal deficit present.     Mental Status: She is alert.     Cranial Nerves: No cranial nerve deficit.  Psychiatric:        Mood and Affect: Mood normal.     Labs and Imaging: CBC BMET  Recent Labs  Lab 09/22/21 1209  WBC 4.2  HGB 7.9*  HCT 26.1*  PLT 167   Recent Labs  Lab 09/22/21 1209  NA 141  K 5.0  CL 114*  CO2 18*  BUN 53*  CREATININE 2.01*  GLUCOSE 123*  CALCIUM 9.1         Precious Gilding, DO 09/22/2021, 2:03 PM PGY-1, [place] Intern pager: [phone], text pages welcome  FPTS Upper-Level Resident Addendum   I have independently interviewed and examined the patient. I have discussed the above with the original author and agree with their documentation. Please see also any attending notes.   Gifford Shave, MD PGY-3, [facility] 09/22/2021 4:02 PM  [place] Service pager: [phone] (text pages welcome through [facility])

## 2021-09-22 NOTE — ED Notes (Signed)
CBG 69. Given juice and meal tray ordered. ?

## 2021-09-22 NOTE — Assessment & Plan Note (Signed)
Patient presents in acute distress.  O2 sats 81% on room air.  EMS called to transport patient to hospital.  Patient was recently discharged from the hospital for congestive heart failure.  This is most likely acute on chronic heart failure today. ? ?Follow up after hospital discharge ?

## 2021-09-23 ENCOUNTER — Other Ambulatory Visit (HOSPITAL_COMMUNITY): Payer: Self-pay

## 2021-09-23 LAB — GLUCOSE, CAPILLARY
Glucose-Capillary: 61 mg/dL — ABNORMAL LOW (ref 70–99)
Glucose-Capillary: 76 mg/dL (ref 70–99)
Glucose-Capillary: 136 mg/dL — ABNORMAL HIGH (ref 70–99)
Glucose-Capillary: 136 mg/dL — ABNORMAL HIGH (ref 70–99)
Glucose-Capillary: 109 mg/dL — ABNORMAL HIGH (ref 70–99)

## 2021-09-23 LAB — BASIC METABOLIC PANEL
Anion gap: 9 (ref 5–15)
BUN: 55 mg/dL — ABNORMAL HIGH (ref 6–20)
CO2: 19 mmol/L — ABNORMAL LOW (ref 22–32)
Calcium: 8.9 mg/dL (ref 8.9–10.3)
Chloride: 111 mmol/L (ref 98–111)
Creatinine, Ser: 1.97 mg/dL — ABNORMAL HIGH (ref 0.44–1.00)
GFR, Estimated: 29 mL/min — ABNORMAL LOW (ref >60)
Glucose, Bld: 73 mg/dL (ref 70–99)
Potassium: 4.6 mmol/L (ref 3.5–5.1)
Sodium: 139 mmol/L (ref 135–145)

## 2021-09-23 LAB — CBC
HCT: 24.8 % — ABNORMAL LOW (ref 36.0–46.0)
Hemoglobin: 7.5 g/dL — ABNORMAL LOW (ref 12.0–15.0)
MCH: 26.4 pg (ref 26.0–34.0)
MCHC: 30.2 g/dL (ref 30.0–36.0)
MCV: 87.3 fL (ref 80.0–100.0)
Platelets: 149 10*3/uL — ABNORMAL LOW (ref 150–400)
RBC: 2.84 MIL/uL — ABNORMAL LOW (ref 3.87–5.11)
RDW: 14 % (ref 11.5–15.5)
WBC: 3.1 10*3/uL — ABNORMAL LOW (ref 4.0–10.5)
nRBC: 0 % (ref 0.0–0.2)

## 2021-09-23 LAB — HEMOGLOBIN A1C
Hgb A1c MFr Bld: 5.2 % (ref 4.8–5.6)
Mean Plasma Glucose: 102.54 mg/dL

## 2021-09-23 MED ORDER — AMLODIPINE BESYLATE 2.5 MG PO TABS
2.5000 mg | ORAL_TABLET | Freq: Every day | ORAL | Status: DC
  Administered 2021-09-23 – 2021-09-25 (×3): 2.5 mg via ORAL
  Filled 2021-09-23 (×3): qty 1

## 2021-09-23 MED ORDER — FUROSEMIDE 10 MG/ML IJ SOLN
40.0000 mg | Freq: Once | INTRAMUSCULAR | Status: AC
  Administered 2021-09-23: 40 mg via INTRAVENOUS
  Filled 2021-09-23: qty 4

## 2021-09-23 MED ORDER — ENOXAPARIN SODIUM 60 MG/0.6ML IJ SOSY
55.0000 mg | PREFILLED_SYRINGE | INTRAMUSCULAR | Status: DC
  Administered 2021-09-23 – 2021-09-24 (×2): 55 mg via SUBCUTANEOUS
  Filled 2021-09-23 (×2): qty 0.6

## 2021-09-23 NOTE — Evaluation (Signed)
Occupational Therapy Evaluation Patient Details Name: [name] [name] MRN: [id number] DOB: Jun 07, 1961 Today's Date: 09/23/2021   History of Present Illness Pt is a 61 y.o. female who presents to the ED with SOB on 09/22/21, arrived with an SpO2 of 84% room air. [facility]. Admitted with CHF exacerbation. PMH includes: CHF, anemia, DM2, hyperlipidemia, COPD, CKD, and obesity.   Clinical Impression   Prior to this admission, patient was living with her daughter and her godson in a second story apartment. Patient could climb the stairs (one rest break to complete) and completing ADLs independently (bird bathing instead of showering). Patient endorses she still drives, has had 4 falls in the past months, and does not do the cooking or housework. Currently, patient demonstrating significant cognitive deficits, poor activity tolerance and endurance, need for 3L of oxygen, and intermittent tachycardia. Patient with signifcant difference in cognitive status in comparison to evaluation at [facility] on 3/1. Unable to state the year, perseverating on 2003, could not do simple math with step by step cues and visual aides, multi-modal cues to follow basic commands,  and frequent reorientation to situation. Patient with HR up to 130 sitting EOB, 160 sitting on BSC, and 169 when returning to bed after toileting. Patient also reporting dizziness with movement (see BP levels below). Patient with need of mod-max A for ADLs, and mod A for transfers (+2 beneficial to progress gait). Due to significant difference in PLOF, OT recommending AIR for rehab as patient has a good support system and demonstrates good potential for intensive rehab. OT will continue to follow acutely to address problem list outlined below.   Blood pressure sitting EOB: 142/70 Blood pressure sitting on BSC unable to take in standing due to urgency 130/97 Blood pressure in supine 132/61      Recommendations for follow up therapy are one component of a multi-disciplinary discharge planning process, led by the attending physician.  Recommendations may be updated based on patient status, additional functional criteria and insurance authorization.   Follow Up Recommendations  Acute inpatient rehab (3hours/day)    Assistance Recommended at Discharge Frequent or constant Supervision/Assistance  Patient can return home with the following A lot of help with walking and/or transfers;A lot of help with bathing/dressing/bathroom;Assistance with cooking/housework;Direct supervision/assist for medications management;Direct supervision/assist for financial management;Assist for transportation;Help with stairs or ramp for entrance    Functional Status Assessment  Patient has had a recent decline in their functional status and demonstrates the ability to make significant improvements in function in a reasonable and predictable amount of time.  Equipment Recommendations  Other (comment);BSC/3in1 (Defer to next venue)    Recommendations for Other Services Rehab consult     Precautions / Restrictions Precautions Precautions: Fall Precaution Comments: Watch O2 and HR Restrictions Weight Bearing Restrictions: No      Mobility Bed Mobility Overal bed mobility: Needs Assistance Bed Mobility: Supine to Sit, Sit to Supine     Supine to sit: Min assist Sit to supine: Mod assist, +2 for physical assistance, +2 for safety/equipment   General bed mobility comments: heavy reliance on bed rails to come into sitting (HR up to 140) min A to elevate trunk mod A to return due to poor iniation at hips to elevate BLEs    Transfers Overall transfer level: Needs assistance Equipment used: Rolling walker (2 wheels) Transfers: Sit to/from Stand, Bed to chair/wheelchair/BSC Sit to Stand: Min assist     Step pivot transfers: Mod  assist     General transfer comment: step by step cues for hand  placement, min A to power up into standing, mod A to complete step pivot to [facility] with step by step cues to sequence, poor safety awareness throughout      Balance Overall balance assessment: Mild deficits observed, not formally tested                                         ADL either performed or assessed with clinical judgement   ADL Overall ADL's : Needs assistance/impaired Eating/Feeding: Set up;Sitting   Grooming: Set up;Sitting   Upper Body Bathing: Minimal assistance;Sitting   Lower Body Bathing: Moderate assistance;Maximal assistance;Sitting/lateral leans   Upper Body Dressing : Minimal assistance;Sitting   Lower Body Dressing: Moderate assistance;Maximal assistance;Sitting/lateral leans   Toilet Transfer: Moderate assistance;Stand-pivot;BSC/3in1;Rolling walker (2 wheels) Toilet Transfer Details (indicate cue type and reason): cues to sequence, poor hand placement on walker, unsteady with stand pivot so +2 helpful if ambulating Toileting- Clothing Manipulation and Hygiene: Total assistance;Sit to/from stand Toileting - Clothing Manipulation Details (indicate cue type and reason): Unable to balance in standing to complete independently     Functional mobility during ADLs: Moderate assistance;Cueing for safety;Cueing for sequencing;Rolling walker (2 wheels) General ADL Comments: Patient presenting with decreased activity tolerance, poor endurance, and decreased cognition     Vision Baseline Vision/History: 0 No visual deficits Ability to See in Adequate Light: 0 Adequate Patient Visual Report: No change from baseline       Perception     Praxis      Pertinent Vitals/Pain Pain Assessment Pain Assessment: No/denies pain     Hand Dominance     Extremity/Trunk Assessment Upper Extremity Assessment Upper Extremity Assessment: Generalized weakness (3/5 grossly)   Lower Extremity Assessment Lower Extremity Assessment: Defer to PT evaluation    Cervical / Trunk Assessment Cervical / Trunk Assessment: Kyphotic   Communication Communication Communication: No difficulties   Cognition Arousal/Alertness: Lethargic Behavior During Therapy: WFL for tasks assessed/performed Overall Cognitive Status: Impaired/Different from baseline Area of Impairment: Orientation, Attention, Memory, Following commands, Safety/judgement, Awareness, Problem solving                 Orientation Level: Time (Stating it was 2003) Current Attention Level: Focused Memory: Decreased recall of precautions, Decreased short-term memory Following Commands: Follows one step commands with increased time, Follows multi-step commands with increased time, Follows multi-step commands inconsistently, Follows one step commands inconsistently Safety/Judgement: Decreased awareness of safety, Decreased awareness of deficits Awareness: Emergent Problem Solving: Slow processing, Decreased initiation, Difficulty sequencing, Requires verbal cues, Requires tactile cues General Comments: Patient with signifcant difference in cognitive status in comparison to evaluation at [facility] on 3/1. Unable to state the year, perseverating on 2003, could not do simple math with step by step cues and visual aides, multi-modal cues to follow basic commands, frequent reorientation to situation     General Comments       Exercises     Shoulder Instructions      Home Living Family/patient expects to be discharged to:: Private residence Living Arrangements: Children;Non-relatives/Friends (daughter and godson) Available Help at Discharge: Family;Friend(s);Available 24 hours/day Type of Home: House Home Access: Level entry     Home Layout: Two level;Bed/bath upstairs Alternate Level Stairs-Number of Steps: 10 Alternate Level Stairs-Rails: Left Bathroom Shower/Tub: Tub/shower unit;Curtain;Sponge bathes at baseline   [employer]: Standard  Home Equipment: Rollator (4  wheels)   Additional Comments: Does not wear O2 at home      Prior Functioning/Environment Prior Level of Function : Independent/Modified Independent;Driving;History of Falls (last six months)             Mobility Comments: Tends to not use AD, but reports she should be using her rollator more as she has had x4 falls in past month. Ambulates short household distances. ADLs Comments: Bird baths at sink. Does not donn/doff socks/shoes, wears slippers. Daughter and friend does cleaning and cooking.        OT Problem List: Decreased strength;Decreased range of motion;Decreased activity tolerance;Impaired balance (sitting and/or standing);Decreased coordination;Decreased cognition;Decreased safety awareness;Decreased knowledge of use of DME or AE;Decreased knowledge of precautions;Cardiopulmonary status limiting activity      OT Treatment/Interventions: Self-care/ADL training;Therapeutic exercise;Energy conservation;DME and/or AE instruction;Therapeutic activities;Cognitive remediation/compensation;Patient/family education;Balance training    OT Goals(Current goals can be found in the care plan section) Acute Rehab OT Goals Patient Stated Goal: none stated OT Goal Formulation: Patient unable to participate in goal setting Time For Goal Achievement: 10/07/21 Potential to Achieve Goals: Good  OT Frequency: Min 3X/week    Co-evaluation PT/OT/SLP Co-Evaluation/Treatment: Yes Reason for Co-Treatment: Complexity of the patient's impairments (multi-system involvement);Necessary to address cognition/behavior during functional activity;For patient/therapist safety;To address functional/ADL transfers   OT goals addressed during session: Proper use of Adaptive equipment and DME;Strengthening/ROM;ADL's and self-care      AM-PAC OT "6 Clicks" Daily Activity     Outcome Measure Help from another person eating meals?: A Little Help from another person taking care of personal grooming?: A  Little Help from another person toileting, which includes using toliet, bedpan, or urinal?: A Lot Help from another person bathing (including washing, rinsing, drying)?: A Lot Help from another person to put on and taking off regular upper body clothing?: A Little Help from another person to put on and taking off regular lower body clothing?: A Lot 6 Click Score: 15   End of Session Equipment Utilized During Treatment: Gait belt;Rolling walker (2 wheels) Nurse Communication: Mobility status;Other (comment) (HR and cognition)  Activity Tolerance: Treatment limited secondary to medical complications (Comment) (HR to 169, RN present) Patient left: in bed;with call bell/phone within reach;with bed alarm set;with family/visitor present  OT Visit Diagnosis: Unsteadiness on feet (R26.81);Other abnormalities of gait and mobility (R26.89);Repeated falls (R29.6);Muscle weakness (generalized) (M62.81);History of falling (Z91.81);Other symptoms and signs involving cognitive function;Dizziness and giddiness (R42)                Time: 4580-9983 OT Time Calculation (min): 42 min Charges:  OT General Charges $OT Visit: 1 Visit OT Evaluation $OT Eval Moderate Complexity: 1 Mod OT Treatments $Self Care/Home Management : 8-22 mins  Corinne Ports E. Vinita Prentiss, OTR/L Acute Rehabilitation Services [phone] [place] 09/23/2021, 3:37 PM

## 2021-09-23 NOTE — Consult Note (Addendum)
? ?  [facility] CM Inpatient Consult ? ? ?09/23/2021 ? ?[name] ?09-06-1960 ?[id number] ? ?Managed Medicaid: [employer] ? ?Primary Care Provider:  [name], MD ?[facility] [place] ? ?Patient is active in the Managed Medicaid program with a MM RN Care Coordinator. ? ?Patient admitted with HF exacerbation ?Patient is with therapy, however therapist states pain [correction] - patientis sound asleep. ? ?Plan: Will follow for ongoing needs and update MM RNCM of any additional needs. ? ?For questions, ? ?Natividad Brood, RN BSN CCM ?[facility] Liaison ? [phone] business mobile phone ?Toll free office [phone]  ?Fax number: [phone] ?[email or web address] ?  ? ?

## 2021-09-23 NOTE — Progress Notes (Signed)
Inpatient Rehab Admissions Coordinator:  ? ?Per OT recommendations pt was screened for CIR by Shann Medal, PT, DPT.  Note [employer] Medicare not likely to approve CIR for diagnosis.  Would recommend f/u at an alternative level of care (SNF versus home) per therapy discretion.   ? ?Shann Medal, PT, DPT ?Admissions Coordinator ?[phone] ?09/23/21  ?4:14 PM ? ?

## 2021-09-23 NOTE — Progress Notes (Addendum)
FPTS Interim Night Progress Note ? ?S:Patient alert with CPAP in place.  No difficulty breathing or chest pain.  Had not voided since receiving dose of IV Lasix on admission.  Bladder scanned for 650 cc and straight cath for 550 cc.  Has since voided 650 cc. Rounded with primary night RN. Reports that patient was drowsy earlier but seems to have improved since initiating CPAP. No other concerns voiced.   ? ?O: ?Today's Vitals  ? 09/23/21 0000 09/23/21 0100 09/23/21 0200 09/23/21 0400  ?BP:    (!) 149/64  ?Pulse: 83 83 82 83  ?Resp: (!) 28 (!) 21 (!) 21 18  ?Temp:    98.6 ?F (37 ?C)  ?TempSrc:    Oral  ?SpO2: 98% 98% 95% 95%  ?Weight:    110.5 kg  ?PainSc: Asleep     ? ? ? ? ?A/P: ?Acute on Chronic HFpEF ?Total 1200 cc output since admission ?Continue diureses ?Monitor intake and output, daily weights ? ?OSA  Nocturnal Hypoxemia ?Continue CPAP at night ?NPO while on CPAP ? ? ?Carollee Leitz MD ?PGY-3, [facility] ?Service pager [phone]   ?

## 2021-09-23 NOTE — TOC Benefit Eligibility Note (Addendum)
Patient Advocate Encounter ?  ?Insurance verification completed.   ?  ?The patient is currently admitted and upon discharge could be taking [place]. ?  ?The current 30 day co-pay is, $4.  ? ?PA has been submitted for JARDIANCE on 3.8.23.  ?KEY: [place] ?PA HAS BEEN APPROVED 3.8.23 TO 3.8.24 ? ?The patient is currently admitted and upon discharge could be taking JARDIANCE. ?  ?The current 90 day co-pay is, $4.  ? ? ?The patient is insured through [employer]. ? ?  ? ?

## 2021-09-23 NOTE — TOC Progression Note (Signed)
Transition of Care (TOC) - Progression Note  ? ? ?Patient Details  ?Name: [name] [name] ?MRN: [id number] ?Date of Birth: 1961/02/27 ? ?Transition of Care (TOC) CM/SW Contact  ?Zenon Mayo, RN ?Phone Number: ?09/23/2021, 11:08 AM ? ?Clinical Narrative:    ? From home , HF, poss syncopy, HTN, OSA, COPD, DM2.  Conts on iv lasix.  TOC will continue to follow.  ? ? ?  ?  ? ?Expected Discharge Plan and Services ?  ?  ?  ?  ?  ?                ?  ?  ?  ?  ?  ?  ?  ?  ?  ?  ? ? ?Social Determinants of Health (SDOH) Interventions ?  ? ?Readmission Risk Interventions ?Readmission Risk Prevention Plan 12/05/2020 08/18/2019 06/05/2019  ?Transportation Screening Complete Complete Complete  ?PCP or Specialist Appt within 3-5 Days Complete - -  ?[place] or Home Care Consult Complete - -  ?Social Work Consult for [place] Planning/Counseling Not Complete - -  ?SW consult not completed comments RNCM assigned to this patient - -  ?Palliative Care Screening Not Applicable - -  ?Medication Review Press photographer) Complete Complete Complete  ?PCP or Specialist appointment within 3-5 days of discharge - Complete Complete  ?[place] or Home Care Consult - - Complete  ?SW Recovery Care/Counseling Consult - Complete Complete  ?Palliative Care Screening - Not Applicable Not Applicable  ?[place] - Not Applicable Not Applicable  ?Some recent data might be hidden  ? ? ?

## 2021-09-23 NOTE — Progress Notes (Signed)
Family Medicine Teaching Service ?Daily Progress Note ?Intern Pager: [phone] ? ?Patient name: [name] [name] Medical record number: [id number] ?Date of birth: 1961-03-12 Age: 61 y.o. Gender: female ? ?Primary Care Provider: [name], MD ?Consultants: Heart failure team ?Code Status: full ? ?Pt Overview and Major Events to Date:  ?09/22/2021-admitted ? ?Assessment and Plan: ?Patient is a 61 year old female presented with shortness of breath.  PMH significant for HFpEF, anemia, T2DM, HLD, COPD, CKD stage IIIb, obesity, GERD, hypertension, anemia of chronic disease ? ?SOB likely due to HFpEF exacerbation ?Patient was previously hospitalized from 2/28-09/17/2021 for hypoxemia due to acute diastolic heart failure and was discharged on torsemide 40 mg daily which patient have received from pharmacy and therefore did not take.  Last echo from 09/16/2021 showed LVEF 60 to 65%.  Patient received 40 mg IV Lasix yesterday evening.  Was retaining urine and after in and out cath put out 650 mL.  Total UOP since yesterday of 1.2 L.  Weight today of 110.5 kg, weight at last discharge (09/17/21) of 109.3 kg. This morning patient is breathing comfortably on room air with oxygen saturations in the mid 90's.  ?-Heart failure coordinator consulted ?-post void bladder scans every 6 hrs ?-Lasix IV 40 mg bid ?-Strict I's and O's ?-Daily weights ?-Continuous cardiac monitoring ?-Continuous pulse ox ?-PT/OT eval and treat ?-A.m. BMP ?-Supplemental oxygen as needed ?-Keep pulse ox greater 92% ?-consider jardiance when GFR improves ? ?HTN ?Blood pressure this morning 149/64, has ranged between 115-166/64-84 since admission.  Home medications include amlodipine 2.5 mg daily, Coreg 12.5 mg twice daily, hydralazine 50 mg 3 times daily, spironolactone 25 mg daily. ?-Continue home Coreg and amlodipine ?-Holding spironolactone due to AKI.  We will consider adding hydralazine after monitoring blood pressure. ? ?OSA ?Sleep study done in January 2022  showed mild OSA, CPAP was recommended but not ordered. ?-CPAP nightly ?-Discharged with CPAP ? ?COPD ?Patient confirms home medications include albuterol every 4 hours as needed, Dulera 2 puffs twice daily ?-Continue home meds ? ?T2DM with diabetic neuropathy ?Home medications include Lantus 25 units.  A1c this morning of 5.2.  Patient takes Lyrica for diabetic neuropathy.  Patient received 12 units Semglee yesterday evening.  Blood sugar dropped as low as 61 overnight.  Last CBG this morning of 76.  We will hold Semglee. Consider jardiance and ozempic at discharge ?-Sensitive sliding scale insulin ?-CBGs 4 times daily with meals and at bedtime ?-Continue Lyrica 75 mg twice daily ?-consider jardiance when GFR improves ? ?Possible syncopal episode ?Patient had 3 possible syncopal episodes since her last hospitalization.  EKG showed normal sinus normal sinus rhythm, unlikely due to arrhythmia.  Per patient, blood sugars are well controlled.  Orthostatic vitals obtained yesterday ruled out orthostatic hypotension.  Possible these episodes were due to shortness of breath related to HFpEF exacerbation. ?-No further work-up at this time ? ?Anemia of chronic disease ?Hemoglobin  7.9>7.5, MCV 87.3, ferritin WNL at 247.  Baseline hemoglobin appears to be around 9.  Patient takes ferrous sulfate 325 mg daily with breakfast at home. ?-AM CBC ? ?AKI on CKD stage IIIb, improving ?Baseline creatinine appears to be 1.4-1.7.  Creatinine has improved from 2.1> 1.97. ?-A.m. BMP ?-Avoid nephrotoxic medications ? ?FEN/GI: Heart healthy ?PPx: Lovenox ?Dispo: Continue medical management ? ?Subjective:  ?Patient states she is feeling better this morning.  Denies any shortness of breath. ? ?Objective: ?Temp:  [98.3 ?F (36.8 ?C)-98.9 ?F (37.2 ?C)] 98.6 ?F (37 ?C) (03/08 0400) ?Pulse Rate:  [  82-94] 83 (03/08 0400) ?Resp:  [15-84] 18 (03/08 0400) ?BP: (115-166)/(63-84) 149/64 (03/08 0400) ?SpO2:  [94 %-100 %] 95 % (03/08 0400) ?Weight:   [110.5 kg] 110.5 kg (03/08 0400) ?Physical Exam: ?General: Patient lying in bed with boyfriend at bedside, NAD ?Cardiovascular: RRR, normal S1/S2 ?Respiratory: Breathing comfortably on room air, crackles at lung bases ?Extremities: 2+ pitting edema BLEs ? ?Laboratory: ?Recent Labs  ?Lab 09/17/21 ?0627 09/22/21 ?1209 09/23/21 ?0412  ?WBC  --  4.2 3.1*  ?HGB 7.7* 7.9* 7.5*  ?HCT  --  26.1* 24.8*  ?PLT  --  167 149*  ? ?Recent Labs  ?Lab 09/17/21 ?0622 09/22/21 ?1209 09/23/21 ?4580  ?NA 142 141 139  ?K 4.1 5.0 4.6  ?CL 114* 114* 111  ?CO2 20* 18* 19*  ?BUN 55* 53* 55*  ?CREATININE 1.67* 2.01* 1.97*  ?CALCIUM 9.1 9.1 8.9  ?GLUCOSE 141* 123* 73  ? ? ? ? ?Precious Gilding, DO ?09/23/2021, 7:26 AM ?PGY-1, [facility] ?[place] Intern pager: [phone], text pages welcome ? ?

## 2021-09-23 NOTE — Evaluation (Signed)
Physical Therapy Evaluation Patient Details Name: [name] [name] MRN: [id number] DOB: April 02, 1961 Today's Date: 09/23/2021  History of Present Illness  Pt is a 61 y.o. female who presents to the ED with SOB on 09/22/21, arrived with an SpO2 of 84% room air. [facility]. Admitted with CHF exacerbation. PMH includes: CHF, anemia, DM2, hyperlipidemia, COPD, CKD, and obesity.   Clinical Impression  Pt presents with condition above and deficits mentioned below, see PT Problem List. PTA, she was independent, not using an AD even though she reports she believes she should have been using her rollator. Patient endorses she still drives, has had 4 falls in the past months, and does not do the cooking or housework. She lives with relatives in a 2-level house with a level entry. Her bedroom/bathroom are accessible via x10 stairs inside, but she reports she normally has to sit on the stair to rest halfway up. Currently, pt displays deficits in cognition, balance, bil lower extremity strength (R weaker than L), and activity tolerance that place her at risk for subsequent falls and injury. Pt required minA for supine > sit transitions and transfers to sit, but modAx2 to return to supine from sit or modA to take a few side steps to transfer between surfaces. Considering her significant change in functional status, motivation to participate and improve, and good social support, she would greatly benefit from intensive therapy in the AIR setting. If AIR is not possible, then she may benefit from a short-term rehab stay at a SNF. Will continue to follow acutely.     Recommendations for follow up therapy are one component of a multi-disciplinary discharge planning process, led by the attending physician.  Recommendations may be updated based on patient status, additional functional criteria and insurance authorization.  Follow Up Recommendations Acute inpatient rehab  (3hours/day)    Assistance Recommended at Discharge Frequent or constant Supervision/Assistance  Patient can return home with the following  Assistance with cooking/housework;Assist for transportation;Help with stairs or ramp for entrance;Direct supervision/assist for medications management;Direct supervision/assist for financial management;A lot of help with walking and/or transfers;A lot of help with bathing/dressing/bathroom    Equipment Recommendations Rolling walker (2 wheels);BSC/3in1  Recommendations for Other Services  Rehab consult    Functional Status Assessment Patient has had a recent decline in their functional status and demonstrates the ability to make significant improvements in function in a reasonable and predictable amount of time.     Precautions / Restrictions Precautions Precautions: Fall Precaution Comments: Watch O2 and HR Restrictions Weight Bearing Restrictions: No      Mobility  Bed Mobility Overal bed mobility: Needs Assistance Bed Mobility: Supine to Sit, Sit to Supine     Supine to sit: Min assist, HOB elevated Sit to supine: Mod assist, +2 for physical assistance, +2 for safety/equipment   General bed mobility comments: heavy reliance on bed rails to come into sitting (HR up to 140) min A to elevate trunk. mod A to return to supine due to poor iniation at hips to elevate BLEs    Transfers Overall transfer level: Needs assistance Equipment used: Rolling walker (2 wheels) Transfers: Sit to/from Stand, Bed to chair/wheelchair/BSC Sit to Stand: Min assist, +2 safety/equipment   Step pivot transfers: Mod assist, +2 safety/equipment       General transfer comment: step by step cues for hand placement, min A to power up into standing, mod A to complete step pivot to/from [facility] with step by step  cues to sequence, poor safety awareness throughout    Ambulation/Gait Ambulation/Gait assistance: Mod assist, +2 safety/equipment Gait Distance (Feet): 3  Feet (x2 bouts of ~3 ft each) Assistive device: Rolling walker (2 wheels) Gait Pattern/deviations: Step-through pattern, Decreased stride length, Decreased step length - left, Decreased step length - right, Shuffle, Trunk flexed Gait velocity: reduced Gait velocity interpretation: <1.31 ft/sec, indicative of household ambulator   General Gait Details: Pt with slow, shuffling steps between bed and commode. Step-by-step cues for sequencing feet and RW management.  Stairs            Wheelchair Mobility    Modified Rankin (Stroke Patients Only)       Balance Overall balance assessment: Needs assistance Sitting-balance support: No upper extremity supported, Feet supported Sitting balance-Leahy Scale: Fair Sitting balance - Comments: Static sitting EOB with min guard for safety   Standing balance support: Bilateral upper extremity supported, During functional activity, Reliant on assistive device for balance Standing balance-Leahy Scale: Poor Standing balance comment: Reliant on RW and up to min-modA                             Pertinent Vitals/Pain Pain Assessment Pain Assessment: No/denies pain    Home Living Family/patient expects to be discharged to:: Private residence Living Arrangements: Children;Non-relatives/Friends (daughter and godson) Available Help at Discharge: Family;Friend(s);Available 24 hours/day Type of Home: House Home Access: Level entry     Alternate Level Stairs-Number of Steps: 10 Home Layout: Two level;Bed/bath upstairs Home Equipment: Rollator (4 wheels) Additional Comments: Does not wear O2 at home    Prior Function Prior Level of Function : Independent/Modified Independent;Driving;History of Falls (last six months)             Mobility Comments: Tends to not use AD, but reports she should be using her rollator more as she has had x4 falls in past month. Ambulates short household distances. ADLs Comments: Bird baths at sink.  Does not donn/doff socks/shoes, wears slippers. Daughter and friend does cleaning and cooking.     Hand Dominance        Extremity/Trunk Assessment   Upper Extremity Assessment Upper Extremity Assessment: Defer to OT evaluation    Lower Extremity Assessment Lower Extremity Assessment: RLE deficits/detail;LLE deficits/detail RLE Deficits / Details: MMT score of 3- hip flexion, 4 knee extension, 4+ ankle dorsiflexion; reports hx of peripheral neuropathy RLE Sensation: history of peripheral neuropathy LLE Deficits / Details: MMT score of 4 hip flexion, 4+ knee extension, 4+ ankle dorsiflexion; reports hx of peripheral neuropathy LLE Sensation: history of peripheral neuropathy    Cervical / Trunk Assessment Cervical / Trunk Assessment: Kyphotic  Communication   Communication: No difficulties  Cognition Arousal/Alertness: Lethargic Behavior During Therapy: WFL for tasks assessed/performed Overall Cognitive Status: Impaired/Different from baseline Area of Impairment: Orientation, Attention, Memory, Following commands, Safety/judgement, Awareness, Problem solving                 Orientation Level: Time (Stating it was 2003) Current Attention Level: Focused Memory: Decreased recall of precautions, Decreased short-term memory Following Commands: Follows one step commands with increased time, Follows multi-step commands with increased time, Follows multi-step commands inconsistently, Follows one step commands inconsistently Safety/Judgement: Decreased awareness of safety, Decreased awareness of deficits Awareness: Emergent Problem Solving: Slow processing, Decreased initiation, Difficulty sequencing, Requires verbal cues, Requires tactile cues General Comments: Patient with signifcant difference in cognitive status in comparison to evaluation at [facility] on 3/1. Unable  to state the year, perseverating on 2003, could not do simple math with step by step cues and visual aides,  multi-modal cues to follow basic commands, frequent reorientation to situation        General Comments General comments (skin integrity, edema, etc.): HR up to 160 intermittently then would reduce to 110-130s again; SpO2 decreased to as low as 87% on 3L at one point then rebounded to >/= 90%; BP 142/70 supine, 138/97 sitting EOB, 132/61 supine end of session    Exercises     Assessment/Plan    PT Assessment Patient needs continued PT services  PT Problem List Decreased strength;Decreased activity tolerance;Decreased balance;Decreased mobility;Decreased cognition;Decreased knowledge of use of DME;Decreased safety awareness;Cardiopulmonary status limiting activity;Impaired sensation;Obesity       PT Treatment Interventions DME instruction;Therapeutic exercise;Gait training;Balance training;Stair training;Neuromuscular re-education;Functional mobility training;Therapeutic activities;Cognitive remediation;Patient/family education    PT Goals (Current goals can be found in the Care Plan section)  Acute Rehab PT Goals Patient Stated Goal: to get better and go home PT Goal Formulation: With patient Time For Goal Achievement: 10/07/21 Potential to Achieve Goals: Good    Frequency Min 3X/week     Co-evaluation PT/OT/SLP Co-Evaluation/Treatment: Yes Reason for Co-Treatment: Necessary to address cognition/behavior during functional activity;Complexity of the patient's impairments (multi-system involvement);For patient/therapist safety;To address functional/ADL transfers PT goals addressed during session: Mobility/safety with mobility;Balance;Proper use of DME OT goals addressed during session: Proper use of Adaptive equipment and DME;Strengthening/ROM;ADL's and self-care       AM-PAC PT "6 Clicks" Mobility  Outcome Measure Help needed turning from your back to your side while in a flat bed without using bedrails?: A Little Help needed moving from lying on your back to sitting on the  side of a flat bed without using bedrails?: A Lot Help needed moving to and from a bed to a chair (including a wheelchair)?: A Lot Help needed standing up from a chair using your arms (e.g., wheelchair or bedside chair)?: A Little Help needed to walk in hospital room?: Total Help needed climbing 3-5 steps with a railing? : Total 6 Click Score: 12    End of Session Equipment Utilized During Treatment: Gait belt;Oxygen Activity Tolerance: Patient tolerated treatment well Patient left: in bed;with call bell/phone within reach;with bed alarm set Nurse Communication: Mobility status;Other (comment) (vitals) PT Visit Diagnosis: Unsteadiness on feet (R26.81);History of falling (Z91.81);Muscle weakness (generalized) (M62.81);Other abnormalities of gait and mobility (R26.89);Difficulty in walking, not elsewhere classified (R26.2)    Time: 0932-3557 PT Time Calculation (min) (ACUTE ONLY): 36 min   Charges:   PT Evaluation $PT Eval Moderate Complexity: 1 Mod          Moishe Spice, PT, DPT Acute Rehabilitation Services  Pager: [phone] Office: [phone]   Orvan Falconer 09/23/2021, 5:25 PM

## 2021-09-24 DIAGNOSIS — I509 Heart failure, unspecified: Secondary | ICD-10-CM

## 2021-09-24 LAB — CBC
HCT: 27.5 % — ABNORMAL LOW (ref 36.0–46.0)
Hemoglobin: 8.4 g/dL — ABNORMAL LOW (ref 12.0–15.0)
MCH: 26.3 pg (ref 26.0–34.0)
MCHC: 30.5 g/dL (ref 30.0–36.0)
MCV: 85.9 fL (ref 80.0–100.0)
Platelets: 163 10*3/uL (ref 150–400)
RBC: 3.2 MIL/uL — ABNORMAL LOW (ref 3.87–5.11)
RDW: 13.8 % (ref 11.5–15.5)
WBC: 3.2 10*3/uL — ABNORMAL LOW (ref 4.0–10.5)
nRBC: 0 % (ref 0.0–0.2)

## 2021-09-24 LAB — GLUCOSE, CAPILLARY
Glucose-Capillary: 106 mg/dL — ABNORMAL HIGH (ref 70–99)
Glucose-Capillary: 199 mg/dL — ABNORMAL HIGH (ref 70–99)
Glucose-Capillary: 189 mg/dL — ABNORMAL HIGH (ref 70–99)
Glucose-Capillary: 167 mg/dL — ABNORMAL HIGH (ref 70–99)

## 2021-09-24 LAB — BASIC METABOLIC PANEL
Anion gap: 9 (ref 5–15)
BUN: 49 mg/dL — ABNORMAL HIGH (ref 6–20)
CO2: 21 mmol/L — ABNORMAL LOW (ref 22–32)
Calcium: 9.5 mg/dL (ref 8.9–10.3)
Chloride: 112 mmol/L — ABNORMAL HIGH (ref 98–111)
Creatinine, Ser: 1.66 mg/dL — ABNORMAL HIGH (ref 0.44–1.00)
GFR, Estimated: 35 mL/min — ABNORMAL LOW (ref >60)
Glucose, Bld: 96 mg/dL (ref 70–99)
Potassium: 4.8 mmol/L (ref 3.5–5.1)
Sodium: 142 mmol/L (ref 135–145)

## 2021-09-24 LAB — MAGNESIUM: Magnesium: 1.8 mg/dL (ref 1.7–2.4)

## 2021-09-24 MED ORDER — FUROSEMIDE 10 MG/ML IJ SOLN
40.0000 mg | Freq: Once | INTRAMUSCULAR | Status: DC
Start: 2021-09-24 — End: 2021-09-24

## 2021-09-24 MED ORDER — FLUTICASONE PROPIONATE 50 MCG/ACT NA SUSP
1.0000 | Freq: Every day | NASAL | Status: DC
  Filled 2021-09-24: qty 16

## 2021-09-24 MED ORDER — HYDRALAZINE HCL 50 MG PO TABS
50.0000 mg | ORAL_TABLET | Freq: Three times a day (TID) | ORAL | Status: DC
  Administered 2021-09-24 – 2021-09-25 (×4): 50 mg via ORAL
  Filled 2021-09-24 (×4): qty 1

## 2021-09-24 MED ORDER — TORSEMIDE 20 MG PO TABS
40.0000 mg | ORAL_TABLET | Freq: Every day | ORAL | Status: DC
  Administered 2021-09-24 – 2021-09-25 (×2): 40 mg via ORAL
  Filled 2021-09-24 (×2): qty 2

## 2021-09-24 MED ORDER — EMPAGLIFLOZIN 10 MG PO TABS
10.0000 mg | ORAL_TABLET | Freq: Every day | ORAL | Status: DC
  Administered 2021-09-24 – 2021-09-25 (×2): 10 mg via ORAL
  Filled 2021-09-24 (×2): qty 1

## 2021-09-24 MED ORDER — FUROSEMIDE 10 MG/ML IJ SOLN: 40.0000 mg | Freq: Once | INTRAMUSCULAR | Status: DC

## 2021-09-24 NOTE — Progress Notes (Signed)
Family Medicine Teaching Service ?Daily Progress Note ?Intern Pager: [phone] ? ?Patient name: [name] [name] Medical record number: [id number] ?Date of birth: [birth year] Age: 61 y.o. Gender: female ? ?Primary Care Provider: [name], MD ?Consultants: none ?Code Status: full ? ?Pt Overview and Major Events to Date:  ?09/22/2021-Admitted ? ?Assessment and Plan: ?Patient is a 61 year old female who presented with shortness of breath.  PMH significant for HFpEF, anemia, T2DM, HLD, COPD, CKD stage IIIb, obesity, GERD, hypertension, anemia of chronic disease ? ?SOB likely due to HFpEF exacerbation ?Patient was previously hospitalized from 2/28/09/17/21 for hypoxemia due to acute diastolic heart failure and discharged on torsemide at 40 mg daily-patient was unable to take due to issues getting prescription.  Last echo from 09/16/2021 showed LVEF of 60 to 65%.  Yesterday patient received IV Lasix 40 mg twice daily.  Patient had total urinary output recorded of 2.45 L with 3 unmeasured urine occurences and weight today of 107.9 kg, down from 110.5 kg yesterday.  Patient was on 2 L via nasal cannula initially, reduced her to 1 L nasal cannula and she remained with oxygen saturations of 98%.  Was going to trial on room air however patient needed to have a BM and exam was cut short.  PT saw patient yesterday and recommend intensive therapy in the AIR setting or if not possible, short-term rehab stay at [facility]. ?-torsemide '40mg'$  PO ?-Strict I's and O's ?-Daily weights ?-Continuous cardiac monitoring, continuous pulse ox ?-PT/OT eval and treat ?-Ambulatory pulse ox ?-Start Jardiance 10 mg dialy ?-Supplemental oxygen as needed ?-Keep pulse ox greater than 92% ? ? ?Possible bladder outlet obstruction? ?Yesterday every 6 hours bladder scans were ordered due to patient previously retaining urine.  Was notified by RN that patient had > 380 mL so RN was instructed to eat in and out cath today and get a postvoid bladder scan if possible.  Per morning RN, last night patient was able to void 1 L on her own with postvoid residual scan showing 83 mL.  As a point overnight patient had bladder scan showing 800 mL of retained urine with an in and out cath retrieving 700 mL.  We will continue to monitor and attempt to get another post void bladder scan today. ?-postvoid bladder scans today ?-Strict I's and O's ? ?AKI on CKD stage IIIb ?Baseline creatinine appears to be 1.4-1.7.  Creatinine had improved to baseline, now 1.66 (was 2.01 on admission). ?-A.m. BMP ?-Avoid nephrotoxic medications ? ?HTN ?BP has been 140's/60's.  Home medications include amlodipine 2.5 mg daily, Coreg 12.5 mg twice daily, hydralazine 50 mg 3 times daily, spironolactone 25 mg daily. ?-Yesterday patient was continued on home Coreg and amlodipine ?-Spironolactone held due to AKI ?-Hydralazine held yesterday due to wide pulse pressures, can start today ? ?OSA ?Sleep study done in January 2022 showed mild OSA and CPAP was recommended but not ordered ?-CPAP nightly ?-Discharged on CPAP with possible ? ?COPD ?Home medications include albuterol every 4 hours as needed, Dulera 2 puffs twice daily ?-Continue home meds ? ?T2DM with diabetic neuropathy ?Home meds include Lantus 25 units.  A1c this morning of 5.2.  Yesterday Semglee 12 units was held due to drop in blood sugar the previous night of 61.  Last CBG is 106. Received total of 2 U aspart yesterday. We will consider adding Ozempic at discharge ?-d/c Sensitive SSI ?-Start Jardiance 10 mg dialy ?-Continue Lyrica 75 mg twice daily ? ?Anemia of chronic disease ?Hemoglobin 7.5>8.4, MCV 85.9, ferritin  was within normal limits yesterday at 247.  Baseline hemoglobin appears to be around 9. ?-AM CBC ? ? ? ?FEN/GI: Heart healthy ?PPx: Lovenox ?Dispo: Home pending continued medical management ? ?Subjective:  ?Patient states she is feeling tired this morning because she stayed up watching television last night.  Currently breathing comfortably on  2 L, reduced to 1 L still breathing comfortably. ? ?Objective: ?Temp:  [98.2 ?F (36.8 ?C)-98.9 ?F (37.2 ?C)] 98.9 ?F (37.2 ?C) (03/09 0343) ?Pulse Rate:  [81-84] 83 (03/08 2319) ?Resp:  [20-21] 20 (03/08 2319) ?BP: (136-144)/(64-70) 144/67 (03/08 2319) ?SpO2:  [93 %-99 %] 97 % (03/08 2319) ?Physical Exam: ?General: Patient lying in bed with significant other at bedside, NAD ?Cardiovascular: RRR, normal K8/L2, soft systolic murmur heard ?Respiratory: Breathing comfortably on 1 L nasal cannula ?Extremities: 1+ pitting edema BLEs ? ?Laboratory: ?Recent Labs  ?Lab 09/17/21 ?0627 09/22/21 ?1209 09/23/21 ?0412  ?WBC  --  4.2 3.1*  ?HGB 7.7* 7.9* 7.5*  ?HCT  --  26.1* 24.8*  ?PLT  --  167 149*  ? ?Recent Labs  ?Lab 09/17/21 ?0622 09/22/21 ?1209 09/23/21 ?7517  ?NA 142 141 139  ?K 4.1 5.0 4.6  ?CL 114* 114* 111  ?CO2 20* 18* 19*  ?BUN 55* 53* 55*  ?CREATININE 1.67* 2.01* 1.97*  ?CALCIUM 9.1 9.1 8.9  ?GLUCOSE 141* 123* 73  ? ? ? ?Precious Gilding, DO ?09/24/2021, 5:21 AM ?PGY-1, [facility] ?[place] Intern pager: [phone], text pages welcome ? ? ?

## 2021-09-24 NOTE — Progress Notes (Signed)
FPTS Interim Night Progress Note ? ?S:Patient sleeping comfortably.  Rounded with primary night RN.  No concerns voiced.  No orders required.   ? ?O: ?Today's Vitals  ? 09/23/21 2015 09/23/21 2016 09/23/21 2314 09/23/21 2319  ?BP: (!) 142/64   (!) 144/67  ?Pulse: 83  81 83  ?Resp: (!) 21   20  ?Temp:  98.4 ?F (36.9 ?C)  98.7 ?F (37.1 ?C)  ?TempSrc:  Oral  Axillary  ?SpO2: 93% 97% 97% 97%  ?Weight:      ?PainSc:  0-No pain    ? ? ? ? ?A/P: ?Continue current management ? ?Carollee Leitz MD ?PGY-3, [facility] ?Service pager [phone]   ?

## 2021-09-25 ENCOUNTER — Other Ambulatory Visit (HOSPITAL_COMMUNITY): Payer: Self-pay

## 2021-09-25 ENCOUNTER — Other Ambulatory Visit: Payer: Medicare Other

## 2021-09-25 ENCOUNTER — Encounter (HOSPITAL_COMMUNITY): Payer: Self-pay | Admitting: Family Medicine

## 2021-09-25 ENCOUNTER — Ambulatory Visit: Payer: Medicare Other | Admitting: Family

## 2021-09-25 LAB — CBC
HCT: 26.3 % — ABNORMAL LOW (ref 36.0–46.0)
Hemoglobin: 8 g/dL — ABNORMAL LOW (ref 12.0–15.0)
MCH: 25.7 pg — ABNORMAL LOW (ref 26.0–34.0)
MCHC: 30.4 g/dL (ref 30.0–36.0)
MCV: 84.6 fL (ref 80.0–100.0)
Platelets: 168 10*3/uL (ref 150–400)
RBC: 3.11 MIL/uL — ABNORMAL LOW (ref 3.87–5.11)
RDW: 13.8 % (ref 11.5–15.5)
WBC: 3.6 10*3/uL — ABNORMAL LOW (ref 4.0–10.5)
nRBC: 0 % (ref 0.0–0.2)

## 2021-09-25 LAB — BASIC METABOLIC PANEL
Anion gap: 8 (ref 5–15)
BUN: 46 mg/dL — ABNORMAL HIGH (ref 6–20)
CO2: 24 mmol/L (ref 22–32)
Calcium: 9.3 mg/dL (ref 8.9–10.3)
Chloride: 107 mmol/L (ref 98–111)
Creatinine, Ser: 1.75 mg/dL — ABNORMAL HIGH (ref 0.44–1.00)
GFR, Estimated: 33 mL/min — ABNORMAL LOW (ref >60)
Glucose, Bld: 160 mg/dL — ABNORMAL HIGH (ref 70–99)
Potassium: 4.7 mmol/L (ref 3.5–5.1)
Sodium: 139 mmol/L (ref 135–145)

## 2021-09-25 LAB — GLUCOSE, CAPILLARY
Glucose-Capillary: 115 mg/dL — ABNORMAL HIGH (ref 70–99)
Glucose-Capillary: 154 mg/dL — ABNORMAL HIGH (ref 70–99)

## 2021-09-25 MED ORDER — EMPAGLIFLOZIN 10 MG PO TABS
10.0000 mg | ORAL_TABLET | Freq: Every day | ORAL | 0 refills | Status: DC
  Filled 2021-09-25: qty 90, 90d supply, fill #0

## 2021-09-25 MED ORDER — OZEMPIC (0.25 OR 0.5 MG/DOSE) 2 MG/1.5ML ~~LOC~~ SOPN
0.2500 mg | PEN_INJECTOR | SUBCUTANEOUS | 0 refills | Status: DC
  Filled 2021-09-25: qty 1.5, 56d supply, fill #0

## 2021-09-25 MED ORDER — TORSEMIDE 20 MG PO TABS
ORAL_TABLET | ORAL | 0 refills | Status: DC
  Filled 2021-09-25: qty 30, 30d supply, fill #0

## 2021-09-25 NOTE — Progress Notes (Signed)
Heart Failure Nurse Navigator Progress Note ? ?Evaluated for H&V TOC Clinic ? ?Echo: 60-65%, G2DD ? ?Frequent readmits ? ?Appt sch for H&V TOC Clinic on Mon 10/05/21  ? ?Kevan Rosebush, RN, BSN, CHFN ?Heart Failure Navigator ?Heart & Vascular Care Navigation Team ? ?

## 2021-09-25 NOTE — Discharge Instructions (Addendum)
Dear [name] [name], ? ?Thank you for letting us participate in your care. You were hospitalized for shortness of breath and low oxygen levels and diagnosed with Heart failure. You were treated with diuretics and supplemental oxygen.  ? ?POST-HOSPITAL & CARE INSTRUCTIONS ?Take torsemide 20 mg (1 tablet) daily ?Take Jardiance 10 mg daily (a medicine for both heart failure and diabetes) ?Inject Ozempic into the skin once a week (for diabetes)  ?Stop taking your insulin glargine ?Do not take spironolactone until following up with your primary care provider but continue to take your other blood pressure medications (amlodipine, hydralazine, and carvedilol) ?Continue taking your other home medications as listed in this discharge paperwork ?Make an appointment with  your primary care provider for a hospital follow up visit within once week of discharge ?Go to your follow up appointments (listed below) ? ? ?DOCTOR'S APPOINTMENT   ?Future Appointments  ?Date Time Provider [place]  ?10/05/2021 11:00 AM [facility] None  ?10/05/2021  1:30 PM Tresa Endo, RPH-CPP [facility] None  ?10/13/2021  8:15 AM Star Age, MD [facility] None  ?10/22/2021  3:30 PM [facility] CARE MANAGER 2 [facility] None  ?11/24/2021  3:50 PM [name], MD [facility] None  ?12/18/2021  2:20 PM Lendon Colonel, NP [facility]  ? ? ? ?Take care and be well! ? ?Family Medicine Teaching Service Inpatient Team ?[place]  ?[facility]  ?[address] [place],  [address] ?[phone] ? ? ? ?

## 2021-09-25 NOTE — Discharge Summary (Signed)
Family Medicine Teaching Service ?Hospital Discharge Summary ? ?Patient name: [name] [name] Medical record number: [id number] ?Date of birth: 01-19-1961 Age: 61 y.o. Gender: female ?Date of Admission: 09/22/2021  Date of Discharge: 09/25/2021 ?Admitting Physician: Zenia Resides, MD ? ?Primary Care Provider: [name], MD ?Consultants: None ? ?Indication for Hospitalization: Shortness of breath due to HFpEF exacerbation ? ?Discharge Diagnoses/Problem List:  ?Principal Problem: ?  Heart failure ([place]) ?Active Problems: ?  Chronic anemia ?  OSA (obstructive sleep apnea) ?  Acute on chronic heart failure with preserved ejection fraction (HFpEF) ([place]) ? ? ?Disposition: home ? ?Discharge Condition: stable ? ?Discharge Exam:  ?General: Patient lying comfortably in bed with significant other at bedside, NAD ?Cardiovascular: RRR normal S1/S2 ?Respiratory: CTAB, normal effort ?Abdomen: Normal bowel sounds, soft, nontender to palpation, nondistended ?Extremities: 2+ pitting edema BLEs ? ? ?Brief Hospital Course:  ?[name] [name] is a 61 y.o.female with a history of CHF, T2DM, HLD, COPD, CKD 3B, obesity, GERD, HTN, anemia of chronic disease who was admitted to the [facility] Teaching Service at [facility] for shortness of breath. Her hospital course is detailed below: ? ?HFpEF exacerbation ?Patient presented to ED after being without home torsemide for 1 week and having oxygen requirement of 2 L with accompanying shortness of breath.  Patient was recently hospitalized from 09/15/2021 to 09/17/2021 due to acute diastolic heart failure.  Echo during that time showed LVEF of 60 to 65% with G2DD.  CXR notable for cardiomegaly with central pulmonary vascular congestion.  Respiratory panel negative.  Troponin normal but BNP elevated at 263.9.  During hospitalization, patient was diuresed with total UOP of 4.86 L. At discharge pt was breathing comfortably on room air. Pt was discharged with home health.  ? ?Diabetes ?Pt had  HgbA1c on admission of 5.2. Pt was started and discharges on Jardiance 10 mg daily. Her Lantus was discontinued and she was also discharged on Ozempic 0.25 mg weekly. ? ?Hypertension  AKI ?Home medications of amlodipine 2.5 mg daily, Coreg 12.5 mg twice daily and hydralazine 50 mg 3 times daily were continued during hospitalization.  Spironolactone 25 mg daily was held due to AKI and continued diuresis.  AKI resolved by discharge but spironolactone was held at discharge with instructions to follow-up with primary care to provider before restarting. ? ?Anemia ?Pt had hemoglobin of 8 at discharge with MCV of 84.6 and ferritin WNL at 247. As this appears to be anemia of chronic disease, iron supplements were held and pt was not discharged on iron supplement.  ? ?Other chronic conditions were medically managed with home medications and formulary alternatives as necessary  ? ?PCP Follow-up Recommendations:  ?Continue holding spironolactone and consider restarting at follow up if appropriate (check creatinine)  ?Check BMP  ?Ensure pt is taking all medications appropriately as she is starting multiple new medications (torsemide, jardiance and ozempic) ?Titrate Ozempic as appropriate  ?Follow up on whether pt truly needs iron supplement  ?Ensure follow-up with cardiology ? ? ? ?Significant Procedures: none ? ?Significant Labs and Imaging:  ?Recent Labs  ?Lab 09/23/21 ?0412 09/24/21 ?0437 09/25/21 ?0315  ?WBC 3.1* 3.2* 3.6*  ?HGB 7.5* 8.4* 8.0*  ?HCT 24.8* 27.5* 26.3*  ?PLT 149* 163 168  ? ?Recent Labs  ?Lab 09/22/21 ?1209 09/23/21 ?0412 09/24/21 ?0437 09/25/21 ?0315  ?NA 141 139 142 139  ?K 5.0 4.6 4.8 4.7  ?CL 114* 111 112* 107  ?CO2 18* 19* 21* 24  ?GLUCOSE 123* 73 96 160*  ?BUN 53*  55* 49* 46*  ?CREATININE 2.01* 1.97* 1.66* 1.75*  ?CALCIUM 9.1 8.9 9.5 9.3  ?MG  --   --  1.8  --   ? ? ? ? ?Results/Tests Pending at Time of Discharge: none ? ?Discharge Medications:  ?Allergies as of 09/25/2021   ? ?   Reactions  ?  Lisinopril Swelling  ? ?  ? ?  ?Medication List  ?  ? ?STOP taking these medications   ? ?blood glucose meter kit and supplies Kit ?  ?ferrous sulfate 325 (65 FE) MG tablet ?  ?insulin aspart cartridge ?Commonly known as: NOVOLOG ?  ?Insulin Pen Needle 33G X 5 MM Misc ?  ?Lantus SoloStar 100 UNIT/ML Solostar Pen ?Generic drug: insulin glargine ?  ?spironolactone 25 MG tablet ?Commonly known as: ALDACTONE ?  ? ?  ? ?TAKE these medications   ? ?albuterol 108 (90 Base) MCG/ACT inhaler ?Commonly known as: VENTOLIN HFA ?Inhale 1 puff into the lungs every 4 (four) hours as needed for wheezing or shortness of breath. ?  ?amLODipine 2.5 MG tablet ?Commonly known as: NORVASC ?Take 1 tablet (2.5 mg total) by mouth daily. ?  ?atorvastatin 40 MG tablet ?Commonly known as: LIPITOR ?Take 1 tablet (40 mg total) by mouth every morning. Must have HF appt for further refills ?  ?carvedilol 12.5 MG tablet ?Commonly known as: COREG ?Take 1 tablet (12.5 mg total) by mouth 2 (two) times daily. ?  ?hydrALAZINE 50 MG tablet ?Commonly known as: APRESOLINE ?TAKE ONE TABLET BY MOUTH THREE TIMES DAILY ?  ?Jardiance 10 MG Tabs tablet ?Generic drug: empagliflozin ?Take 1 tablet (10 mg total) by mouth daily. ?Start taking on: September 26, 2021 ?  ?mometasone-formoterol 100-5 MCG/ACT Aero ?Commonly known as: DULERA ?Inhale 2 puffs into the lungs 2 (two) times daily. ?  ?nystatin ointment ?Commonly known as: MYCOSTATIN ?Apply 1 application topically 2 (two) times daily. ?What changed:  ?when to take this ?reasons to take this ?  ?Ozempic (0.25 or 0.5 MG/DOSE) 2 MG/1.5ML Sopn ?Generic drug: Semaglutide(0.25 or 0.5MG/DOS) ?Inject 0.25 mg into the skin once a week. ?  ?pregabalin 75 MG capsule ?Commonly known as: LYRICA ?TAKE ONE CAPSULE BY MOUTH TWICE DAILY ?  ?torsemide 20 MG tablet ?Commonly known as: DEMADEX ?Take one tab (20mg) by mouth every morning ?What changed: additional instructions ?  ? ?  ? ? ?Discharge Instructions: Please refer to Patient  Instructions section of EMR for full details.  Patient was counseled important signs and symptoms that should prompt return to medical care, changes in medications, dietary instructions, activity restrictions, and follow up appointments.  ? ?Follow-Up Appointments: ? Follow-up Information   ? ? Newlin, Enobong, MD. Schedule an appointment as soon as possible for a visit in 1 week(s).   ?Specialty: Family Medicine ?Contact information: ?[address] ?[place] [address] ?[phone] ? ? ?  ?  ? ? Hochrein, James, MD .   ?Specialty: Cardiology ?Contact information: ?[address] ?[address] ?[place] [address] ?[phone] ? ? ?  ?  ? ?  ?  ? ?  ? ? ?Jones, Sarah, DO ?09/25/2021, 3:01 PM ?PGY-1, [facility] ? ?

## 2021-09-25 NOTE — Progress Notes (Signed)
?  Mobility Specialist Criteria Algorithm Info. ? ? ? 09/25/21 1200  ?Oxygen Therapy  ?SpO2 95 %  ?O2 Device Room Air  ?Mobility  ?Activity Ambulated with assistance in hallway;Dangled on edge of bed  ?Range of Motion/Exercises Active;All extremities  ?Level of Assistance Standby assist, set-up cues, supervision of patient - no hands on  ?Assistive Device Front wheel walker  ?Distance Ambulated (ft) 360 ft  ?Activity Response Tolerated well  ? ?Patient received in supine asleep. Required multiple auditory and tactile stimulation to awaken and keep awake. Was very lethargic initially but upon dangling EOB pt became more alert. Ambulated in hallway supervision level with steady gait. Was able to maintain >90% on RA  during session. Returned to room without complaint or incident. Was left dangling EOB with all needs met, call bell in reach. ? ?09/25/2021 ?12:15 PM ? ?[place] Emmert Roethler, CMS, BS EXP ?Acute Rehabilitation Services  ?ATFTD:[phone] ?Office: [phone] ? ?

## 2021-09-25 NOTE — Progress Notes (Signed)
SATURATION QUALIFICATIONS: (This note is used to comply with regulatory documentation for home oxygen) ? ?Patient Saturations on Room Air at Rest = 95% ? ?Patient Saturations on Room Air while Ambulating = 91% ? ?Patient Saturations on 0 Liters of oxygen while Ambulating = n/a% ? ?Please briefly explain why patient needs home oxygen: ?

## 2021-09-25 NOTE — Progress Notes (Signed)
Occupational Therapy Treatment ?Patient Details ?Name: [name] [name] ?MRN: [id number] ?DOB: 05/19/1961 ?Today's Date: 09/25/2021 ? ? ?History of present illness Pt is a 61 y.o. female who presents to the ED with SOB on 09/22/21, arrived with an SpO2 of 84% room air. [facility] issue on 09/15/21. Admitted with CHF exacerbation. PMH includes: CHF, anemia, DM2, hyperlipidemia, COPD, CKD, and obesity. ?  ?OT comments ? Patient continues to make steady progress towards goals in skilled OT session. Patient's session encompassed energy conservation education, further assessment of cognition, and planning for safe discharge home. Patient with significant improvement in mentation in session, able to tell therapist her entire medication schedule (medications come in pillbacks that are designated AM, noon, and PM) and how many pills were in each package. Patient also able to demonstrate anticipatory awareness with regard to energy conservation and IADL tasks. Recommendations upgraded to [facility] due to patient's progress. OT will continue to follow acutely.   ? ?Recommendations for follow up therapy are one component of a multi-disciplinary discharge planning process, led by the attending physician.  Recommendations may be updated based on patient status, additional functional criteria and insurance authorization. ?   ?Follow Up Recommendations ? Home health OT  ?  ?Assistance Recommended at Discharge Intermittent Supervision/Assistance  ?Patient can return home with the following ? A little help with walking and/or transfers;A little help with bathing/dressing/bathroom;Assistance with cooking/housework;Assist for transportation;Help with stairs or ramp for entrance ?  ?Equipment Recommendations ? BSC/3in1  ?  ?Recommendations for Other Services   ? ?  ?Precautions / Restrictions Precautions ?Precautions: Fall ?Precaution Comments: Watch O2 and HR ?Restrictions ?Weight Bearing Restrictions: No  ? ? ?   ? ?Mobility Bed Mobility ?  ?  ?  ?  ?  ?  ?  ?General bed mobility comments: Sitting EOB upon arrival ?  ? ?Transfers ?  ?  ?  ?  ?  ?  ?  ?  ?  ?  ?  ?  ?Balance Overall balance assessment: Needs assistance ?Sitting-balance support: No upper extremity supported, Feet supported ?Sitting balance-Leahy Scale: Good ?  ?  ?  ?  ?  ?  ?  ?  ?  ?  ?  ?  ?  ?  ?  ?  ?   ? ?ADL either performed or assessed with clinical judgement  ? ?ADL Overall ADL's : Needs assistance/impaired ?Eating/Feeding: Independent ?  ?Grooming: Set up;Sitting ?  ?  ?  ?  ?  ?  ?  ?Lower Body Dressing: Minimal assistance;Sitting/lateral leans ?  ?  ?  ?  ?  ?  ?  ?Functional mobility during ADLs: Minimal assistance ?General ADL Comments: Patient with significant improvement in activity tolerance and cognition ?  ? ?Extremity/Trunk Assessment   ?  ?  ?  ?  ?  ? ?Vision   ?  ?  ?Perception   ?  ?Praxis   ?  ? ?Cognition Arousal/Alertness: Awake/alert ?Behavior During Therapy: [facility] for tasks assessed/performed ?Overall Cognitive Status: Within Functional Limits for tasks assessed ?  ?  ?  ?  ?  ?  ?  ?  ?  ?  ?  ?  ?  ?  ?  ?  ?General Comments: Significant improvement in cognition, able to demonstrate anticipatory awareness, engage in energy conservation education, and state medication schedule without cues ?  ?  ?   ?Exercises   ? ?  ?Shoulder Instructions   ? ? ?  ?  General Comments    ? ? ?Pertinent Vitals/ Pain       Pain Assessment ?Pain Assessment: No/denies pain ? ?Home Living   ?  ?  ?  ?  ?  ?  ?  ?  ?  ?  ?  ?  ?  ?  ?  ?  ?  ?  ? ?  ?Prior Functioning/Environment    ?  ?  ?  ?   ? ?Frequency ? Min 3X/week  ? ? ? ? ?  ?Progress Toward Goals ? ?OT Goals(current goals can now be found in the care plan section) ? Progress towards OT goals: Progressing toward goals ? ?Acute Rehab OT Goals ?Patient Stated Goal: to go home ?OT Goal Formulation: With patient ?Time For Goal Achievement: 10/07/21 ?Potential to Achieve Goals: Good  ?Plan Discharge  plan needs to be updated   ? ?Co-evaluation ? ? ?   ?  ?  ?  ?  ? ?  ?AM-PAC OT "6 Clicks" Daily Activity     ?Outcome Measure ? ? Help from another person eating meals?: None ?Help from another person taking care of personal grooming?: A Little ?Help from another person toileting, which includes using toliet, bedpan, or urinal?: A Little ?Help from another person bathing (including washing, rinsing, drying)?: A Little ?Help from another person to put on and taking off regular upper body clothing?: A Little ?Help from another person to put on and taking off regular lower body clothing?: A Little ?6 Click Score: 19 ? ?  ?End of Session   ? ?OT Visit Diagnosis: Unsteadiness on feet (R26.81);Other abnormalities of gait and mobility (R26.89);Repeated falls (R29.6);Muscle weakness (generalized) (M62.81);History of falling (Z91.81);Other symptoms and signs involving cognitive function;Dizziness and giddiness (R42) ?  ?Activity Tolerance Patient tolerated treatment well ?  ?Patient Left in bed;with call bell/phone within reach ?  ?Nurse Communication Mobility status ?  ? ?   ? ?Time: 9470-9628 ?OT Time Calculation (min): 12 min ? ?Charges: OT General Charges ?$OT Visit: 1 Visit ?OT Treatments ?$Self Care/Home Management : 8-22 mins ? ?[name] [name], OTR/L ?Acute Rehabilitation Services ?[phone] ?[phone]  ? ?[name] [name] ?09/25/2021, 1:29 PM ?

## 2021-09-25 NOTE — Progress Notes (Signed)
Family Medicine Teaching Service ?Daily Progress Note ?Intern Pager: [phone] ? ?Patient name: [name] [name] Medical record number: [id number] ?Date of birth: 28-Nov-1960 Age: 61 y.o. Gender: female ? ?Primary Care Provider: [name], MD ?Consultants: none ?Code Status: full ? ?Pt Overview and Major Events to Date:  ?09/22/2021-admitted ? ?Assessment and Plan: ?Patient is a 61 year old female who presented with shortness of breath.  PMH significant for HFpEF, anemia, T2DM, HLD, COPD, CKD stage IIIb, obesity, GERD, hypertension, anemia of chronic disease  ? ?SOB likely due to HFpEF exacerbation, improving ?Patient previously hospitalized from 2/28-3 weeks 09/17/21 for hypoxemia due to acute diastolic heart failure and discharged on torsemide 40 mg daily but patient was unable to take due to issues getting prescription.  Last echo from 09/16/2021 showed ejection fraction of 60 to 65%.  Yesterday patient was transitioned to oral torsemide 40 mg daily and had a total urinary output recorded of 2.95L.  Weight today is down to 105.7, down from 107.9 yesterday and 110.5 kg on admission.  Today patient is breathing comfortably on RA with oxygen saturation in the high 80s.   ?-Torsemide 40 mg p.o. ?-Strict I's and O's ?-Daily weights ?-Continuous cardiac monitoring, continuous pulse ox ?-PT/OT eval and treat ?-Start Jardiance 10 mg daily ?-Supplemental oxygen as needed ?-Keep pulse ox greater than 88% ?-Ambulatory pulse ox tonight ? ?Possible bladder outlet obstruction?, resolved  ?Patient had been intermittently been retaining urine but had good urine output yesterday and overnight on her own. ? ?AKI on CKD stage IIIb, improving ?Baseline creatinine appears to be 1.4-1.7.  Creatinine was 2.91 on admission, was 1.66 yesterday, has bumped up to 1.75 today. ? ?Hypertension ?BPs have been elevated to 140s-160s/60s-70s.  Home medications include amlodipine 2.5 mg daily, Coreg 12.5 mg twice daily, hydralazine 50 mg 3 times  daily, spironolactone 25 mg daily ?-Patient has continued on home Coreg and amlodipine ?-Hydralazine was started back yesterday ?-Spironolactone has been held due to AKI and diuresis ? ?OSA ?Sleep study done in January 2023 showed mild sleep apnea and CPAP was recommended but not ordered ?-CPAP nightly in hospital ? ? ?COPD ?Home medications include albuterol every 4 hours as needed, Dulera 2 puffs twice daily ?-Continue home meds ? ?T2DM with diabetic neuropathy ?A1c of 5.2 on admission.  CBGs have been stable, SSI was DC'd yesterday and Jardiance was started. ?-Discontinue home Lantus ?-Continue Jardiance 10 mg daily ?-Plan for Ozempic at discharge ?-Continue Lyrica 70 mg twice daily for neuropathy ? ?Anemia of chronic disease ?Hemoglobin 8.4>8, MCV84.6, ferritin was within normal limits a few days ago at 247.  Baseline hemoglobin appears to be around 9 ?-AM CBC ? ?Goals of care ?PT and OT both recommend acute inpatient rehab due to decline in cognition, poor activity tolerance with need for oxygen and with intermittent tachycardia.  Due to insurance, CIR not likely to be approved.  Therefore PT recommended follow-up at SNF.  Today, patient states  she wishes to go home with home health. ? ?FEN/GI: Heart healthy ?PPx: Lovenox ?Dispo: Home with home health today ? ?Subjective:  ?Patient states she continues to feel much better than when she initially came in.  Denies any shortness of breath. ? ?Objective: ?Temp:  [97.8 ?F (36.6 ?C)-98.4 ?F (36.9 ?C)] 98.1 ?F (36.7 ?C) (03/10 0325) ?Pulse Rate:  [81-90] 81 (03/10 0325) ?Resp:  [19-24] 20 (03/10 0325) ?BP: (145-162)/(66-78) 146/66 (03/10 0325) ?SpO2:  [95 %-99 %] 96 % (03/10 0325) ?Weight:  [105.7 kg] 105.7 kg (03/10 0001) ?Physical  Exam: ?General: Patient lying comfortably in bed with significant other at bedside, NAD ?Cardiovascular: RRR normal S1/S2 ?Respiratory: CTAB, normal effort ?Abdomen: Normal bowel sounds, soft, nontender to palpation,  nondistended ?Extremities: 2+ pitting edema BLEs ? ?Laboratory: ?Recent Labs  ?Lab 09/23/21 ?0412 09/24/21 ?0437 09/25/21 ?0315  ?WBC 3.1* 3.2* 3.6*  ?HGB 7.5* 8.4* 8.0*  ?HCT 24.8* 27.5* 26.3*  ?PLT 149* 163 168  ? ?Recent Labs  ?Lab 09/23/21 ?0412 09/24/21 ?0437 09/25/21 ?0315  ?NA 139 142 139  ?K 4.6 4.8 4.7  ?CL 111 112* 107  ?CO2 19* 21* 24  ?BUN 55* 49* 46*  ?CREATININE 1.97* 1.66* 1.75*  ?CALCIUM 8.9 9.5 9.3  ?GLUCOSE 73 96 160*  ? ? ? ?Precious Gilding, DO ?09/25/2021, 5:26 AM ?PGY-1, [facility] ?[place] Intern pager: [phone], text pages welcome ? ?

## 2021-09-25 NOTE — Care Management Important Message (Signed)
Important Message ? ?Patient Details  ?Name: [name] [name] ?MRN: [id number] ?Date of Birth: 1961/05/31 ? ? ?Medicare Important Message Given:  Yes ? ? ? ? ?Shelda Altes ?09/25/2021, 8:55 AM ?

## 2021-09-25 NOTE — Progress Notes (Addendum)
FPTS Interim Night Progress Note ? ?S:Patient sleeping comfortably.  Rounded with primary night RN.  No concerns voiced. Documented on 2L n/c but spoke with RN and  on 1L oxygen for comfort.  ? ?O: ?Today's Vitals  ? 09/24/21 1252 09/24/21 1949 09/24/21 2033 09/25/21 0001  ?BP: (!) 145/68  (!) 162/67   ?Pulse: 90  87   ?Resp: 20 (!) 21 (!) 24   ?Temp: 98.4 ?F (36.9 ?C)  97.8 ?F (36.6 ?C)   ?TempSrc: Oral  Oral   ?SpO2: 98%  96% 95%  ?Weight:    105.7 kg  ?PainSc: 0-No pain     ? ? ? ? ?A/P: ?Ambulatory pulse ox in am ?Wean O2 as tolerated ? ?Carollee Leitz MD ?PGY-3, [facility] ?Service pager [phone]   ?

## 2021-09-28 ENCOUNTER — Telehealth: Payer: Self-pay

## 2021-09-28 NOTE — Telephone Encounter (Signed)
From the discharge call: ? ? She stated that she feels a lot better.  ? ?she said she has all of her medications and did not have any questions about her med regime.  She has a glucometer.  She administers her own insulin but her daughter can administer it to her if needed.  ? ?Scheduled to see Feliberto Gottron, NP on 10/05/2021 @ [facility]. ?  ?

## 2021-09-28 NOTE — Telephone Encounter (Signed)
Transition Care Management Follow-up Telephone Call ?Date of discharge and from where: 09/25/2021, [facility] ?How have you been since you were released from the hospital? She stated that she feels a lot better.  ?Any questions or concerns? No ? ?Items Reviewed: ?Did the pt receive and understand the discharge instructions provided? Yes  ?Medications obtained and verified? Yes  - she said she has all of her medications and did not have any questions about her med regime.  She has a glucometer.  She administers her own insulin but her daughter can administer it to her if needed.  ?Other? No  ?Any new allergies since your discharge? No  ?Dietary orders reviewed? Yes ?Do you have support at home? Yes  ? ?Home Care and Equipment/Supplies: ?Were home health services ordered? no ?If so, what is the name of the agency? N/a  ?Has the agency set up a time to come to the patient's home? not applicable ?Were any new equipment or medical supplies ordered?  Yes: RW ?What is the name of the medical supply agency? [employer] ?Were you able to get the supplies/equipment? yes ?Do you have any questions related to the use of the equipment or supplies? No ? ?Functional Questionnaire: (I = Independent and D = Dependent) ?ADLs: uses RW with ambulation. Independent with ADLs.  ? ? ?Follow up appointments reviewed: ? ?PCP Hospital f/u appt confirmed? Yes  Scheduled to see Feliberto Gottron, NP on 10/05/2021 @ [place]. ?[facility] f/u appt confirmed? Yes  Scheduled to see cardiology- 10/05/2021.  ?Are transportation arrangements needed? No  ?If their condition worsens, is the pt aware to call PCP or go to the Emergency Dept.? Yes ?Was the patient provided with contact information for the PCP's office or ED? Yes ?Was to pt encouraged to call back with questions or concerns? Yes ? ?

## 2021-09-28 NOTE — Telephone Encounter (Signed)
Transition Care Management Unsuccessful Follow-up Telephone Call ? ?Date of discharge and from where:  09/25/2021 from [facility] ? ?Attempts:  1st Attempt ? ?Reason for unsuccessful TCM follow-up call:  Left voice message ? ? ?Call after noon.  ?

## 2021-10-01 ENCOUNTER — Encounter: Payer: Self-pay | Admitting: Family

## 2021-10-01 ENCOUNTER — Ambulatory Visit: Payer: Medicare Other | Attending: Family | Admitting: Family

## 2021-10-01 ENCOUNTER — Other Ambulatory Visit: Payer: Self-pay

## 2021-10-01 VITALS — BP 137/80 | HR 97 | Ht 64.0 in | Wt 225.0 lb

## 2021-10-01 DIAGNOSIS — I10 Essential (primary) hypertension: Secondary | ICD-10-CM | POA: Diagnosis not present

## 2021-10-01 DIAGNOSIS — I509 Heart failure, unspecified: Secondary | ICD-10-CM

## 2021-10-01 DIAGNOSIS — Z09 Encounter for follow-up examination after completed treatment for conditions other than malignant neoplasm: Secondary | ICD-10-CM | POA: Diagnosis not present

## 2021-10-01 NOTE — Patient Instructions (Signed)
Labs today ?Follow up with your primary care provider in 1 month for routine check up ?Call or come back with new or worsening symptoms or go to the nearest emergency room. ?

## 2021-10-01 NOTE — Progress Notes (Addendum)
? ?[name] [name], is a 61 y.o. female ? ?JHE:[id number] ? ?[id number] ? ?DOB - 08/22/1960 ? ?Subjective: ?  ? ?Chief Complaint and HPI:  ? ?[name] [name] is a 61 y.o. female here this afternoon for transition of care visit.  Patient was discharged from the local hospital on September 25, 2021 after being admitted on September 22, 2021.  Patient had presented to  her primary care provider on the same day of admission with complaints of shortness of breath.  Patient was immediately sent to local emergency room and from there she was admitted to the hospital.  She has a past medical history of CHF, type 2 diabetes, hyperlipidemia, hypertension, COPD, CKD, and mild obstructive sleep apnea.  At the emergency room she was diagnosed with CHF exacerbation and was discharged on torsemide 40 mg p.o. daily. Patient reports feeling much better, her breathing has improved. Still hasn't received her CPAP machine yet. Patient says that she is going to see her Heart and Kidney doctors soon.  Patient reports that she will need a new H&P before plastic surgery for breast reduction.  Encouraged to wait until she fully recovers from her current condition then schedule another appointment for history and physical, as the plastic surgeon wanted her to recover first.  Patient denies chest pain, shortness of breath, fever, chills, headache, dizziness, or any other symptom.  She is currently taking all her medications as prescribed. ? ?ED/Hospital notes reviewed.   ?Social History: Patient on disability for 10 years, separated with 1 child. Does not smoke or drink alcohol. No illicit drugs use ?Family history: Diabetes in the family, mother died from colon cancer. ? ?ROS:   ?Constitutional:  No f/c, No night sweats, No unexplained weight loss. ?EENT:  No vision changes, No blurry vision, No hearing changes. No mouth, throat, or ear problems.  ?Respiratory: No cough, No SOB ?Cardiac: No CP, no palpitations ?GI:  No abd pain, No  N/V/D. ?Musculoskeletal: No joint pain ?Neuro: No headache, no dizziness, no motor weakness.  ?Skin: No rash ?Endocrine:  No polydipsia. No polyuria.  ?Psych: Denies SI/HI ? ?No problems updated. ? ?ALLERGIES: ?Allergies  ?Allergen Reactions  ? Lisinopril Swelling  ? ? ?PAST MEDICAL HISTORY: ?Past Medical History:  ?Diagnosis Date  ? Anemia   ? CHF (congestive heart failure) ([place])   ? Chronic hepatitis C without hepatic coma ([place]) 11/09/2016  ? Diabetes mellitus   ? Fibroids   ? 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 Qualifier: Diagnosis of  By: Jorene Minors, [place]    ? Hypertension   ? MRSA (methicillin resistant Staphylococcus aureus)   ? Trichomonas   ? VAGINITIS, BACTERIAL, RECURRENT 08/15/2007  ? Qualifier: Diagnosis of  By: Radene Ou MD, [place]    ? ? ?MEDICATIONS AT HOME: ?Prior to Admission medications   ?Medication Sig Start Date End Date Taking? Authorizing Provider  ?albuterol (VENTOLIN HFA) 108 (90 Base) MCG/ACT inhaler Inhale 1 puff into the lungs every 4 (four) hours as needed for wheezing or shortness of breath. 12/10/20   [name], MD  ?amLODipine (NORVASC) 2.5 MG tablet Take 1 tablet (2.5 mg total) by mouth daily. 09/18/21   Loletha Grayer, MD  ?atorvastatin (LIPITOR) 40 MG tablet Take 1 tablet (40 mg total) by mouth every morning. Must have HF appt for further refills ?Patient not taking: Reported on 09/15/2021 12/09/20   [name], FNP  ?carvedilol (COREG) 12.5 MG tablet Take 1 tablet (12.5 mg total) by mouth 2 (two) times daily. 07/03/21 10/01/21  Minus Breeding,  MD  ?empagliflozin (JARDIANCE) 10 MG TABS tablet Take 1 tablet (10 mg total) by mouth daily. 09/26/21   Shary Key, DO  ?hydrALAZINE (APRESOLINE) 50 MG tablet TAKE ONE TABLET BY MOUTH THREE TIMES DAILY ?Patient taking differently: Take 50 mg by mouth 3 (three) times daily. 07/09/21   [name], MD  ?mometasone-formoterol (DULERA) 100-5 MCG/ACT AERO Inhale 2 puffs into the lungs 2 (two) times daily. ?Patient not taking: Reported on 09/23/2021  09/17/21   Loletha Grayer, MD  ?nystatin ointment (MYCOSTATIN) Apply 1 application topically 2 (two) times daily. ?Patient taking differently: Apply 1 application. topically 2 (two) times daily as needed (rash/skin irritation). 08/25/21   [name], MD  ?pregabalin (LYRICA) 75 MG capsule TAKE ONE CAPSULE BY MOUTH TWICE DAILY ?Patient taking differently: Take 75 mg by mouth 2 (two) times daily. 09/08/21   [name], MD  ?Semaglutide,0.25 or 0.'5MG'$ /DOS, (OZEMPIC, 0.25 OR 0.5 MG/DOSE,) 2 MG/1.5ML SOPN Inject 0.25 mg into the skin once a week. 09/25/21   Shary Key, DO  ?torsemide (DEMADEX) 20 MG tablet Take one tab ('20mg'$ ) by mouth every morning 09/25/21   Shary Key, DO  ? ? ? ?Objective:  ?EXAM:  ? ?Vitals:  ? 10/01/21 1404  ?BP: 137/80  ?Pulse: 97  ?SpO2: 92%  ? ?General appearance : A&OX3. NAD. Non-toxic-appearing ?HEENT: Atraumatic and Normocephalic.  PERRLA. EOM intact.   ?Neck: supple, no JVD. No cervical lymphadenopathy. No thyromegaly ?Chest/Lungs:  Breathing-non-labored, Good air entry bilaterally, breath sounds normal without rales, rhonchi, or wheezing  ?CVS: S1 S2 regular, no murmurs, gallops, rubs  ?Abdomen: Bowel sounds present, Non tender and not distended with no gaurding, rigidity or rebound. ?Extremities: Bilateral Lower Ext shows no edema, both legs are warm to touch with = pulse throughout ?Neurology:  CN II-XII grossly intact, Non focal.   ?Psych:  TP linear. J/I WNL. Normal speech. Appropriate eye contact and affect.  ?Skin:  No Rash ? ?Data Review ?Lab Results  ?Component Value Date  ? HGBA1C 5.2 09/23/2021  ? HGBA1C 8.6 (H) 07/03/2021  ? HGBA1C 6.0 (H) 04/02/2021  ? ? ? ?Assessment & Plan  ? ?1. Hospital discharge follow-up ?- Labs today ?- Continue taking all your medications as prescribed ?- Follow up with your provider as needed ? ?2. Essential hypertension ?- Labs today ?- Continue taking all your medications as prescribed ?- Follow up with your provider as needed ? ?3.  Heart failure, unspecified HF chronicity, unspecified heart failure type ([place]) ? ?- Labs today ?- Continue taking all your medications as prescribed ?- Follow up with your provider in 1 month. ?-Follow-up with your cardiologist as scheduled. ? ? ? ?Patient have been counseled extensively about nutrition and exercise ? ? ?The patient was given clear instructions to go to ER or return to medical center if symptoms don't improve, worsen or new problems develop. The patient verbalized understanding. The patient was told to call to get lab results if they haven't heard anything in the next week.  ? ? ? ?Feliberto Gottron, APRN, FNP-C ?[place] ?[place], [place] ?[phone]   ?10/01/2021, 1:11 PM  ?

## 2021-10-01 NOTE — Addendum Note (Signed)
Addended by: Feliberto Gottron on: 10/01/2021 03:26 PM ? ? Modules accepted: Level of Service ? ?

## 2021-10-02 LAB — CMP14+EGFR
ALT: 5 IU/L (ref 0–32)
AST: 8 IU/L (ref 0–40)
Albumin/Globulin Ratio: 2 (ref 1.2–2.2)
Albumin: 4.5 g/dL (ref 3.8–4.9)
Alkaline Phosphatase: 78 IU/L (ref 44–121)
BUN/Creatinine Ratio: 18 (ref 12–28)
BUN: 36 mg/dL — ABNORMAL HIGH (ref 8–27)
Bilirubin Total: 0.4 mg/dL (ref 0.0–1.2)
CO2: 24 mmol/L (ref 20–29)
Calcium: 9.5 mg/dL (ref 8.7–10.3)
Chloride: 104 mmol/L (ref 96–106)
Creatinine, Ser: 2 mg/dL — ABNORMAL HIGH (ref 0.57–1.00)
Globulin, Total: 2.2 g/dL (ref 1.5–4.5)
Glucose: 118 mg/dL — ABNORMAL HIGH (ref 70–99)
Potassium: 4.3 mmol/L (ref 3.5–5.2)
Sodium: 144 mmol/L (ref 134–144)
Total Protein: 6.7 g/dL (ref 6.0–8.5)
eGFR: 28 mL/min/{1.73_m2} — ABNORMAL LOW (ref >59)

## 2021-10-02 NOTE — Progress Notes (Unsigned)
? ? ?S:    ?[name] [name] is a 61 y.o. female who presents for diabetes evaluation, education, and management. PMH is significant for HF, HTN, HLD, T2DM, OSA, CKD, obesity. Patient was last seen by PCP, Dr. Margarita [name] on 08/25/21. At last visit, ***.  ? ?Today, {He/she (caps):30048} arrives in *** spirits and presents {w-w/o:315700} assistance. *** ?*** ?Don't see where referral to pharmacy was made? A1c is 5.2 3/8  ?[name] [name] was started during March hospital admission for HFpEF exacerbation and stopped Lantus/Novolog? ?Sees PCP 4/17 ? ? ?Patient reports Diabetes was diagnosed in ***.  ? ?Family/Social History:  ?-Never smoker ? ?Current diabetes medications include: Ozempic 0.25 mg weekly, Jardiance 10 mg daily ?Current hypertension medications include: amlodipine 2.5 mg daily, carvedilol 12.5 mg BID, hydralazine 50 mg TID, torsemide 20 mg daily ?Current hyperlipidemia medications include: atorvastatin 40 mg ? ?Patient states that {He/she (caps):30048} {Is/is not:9024} taking {his/her/their:21314} medications as prescribed. Patient {Actions; denies-reports:120008} adherence with medications. Patient states that {He/she (caps):30048} misses {his/her/their:21314} medications *** times per week, on average. ? ?Do you feel that your medications are working for you? {YES NO:22349} ?Have you been experiencing any side effects to the medications prescribed? {YES NO:22349} ?Do you have any problems obtaining medications due to transportation or finances? {YES NO:22349} ?Insurance coverage:  ? ?Patient {Actions; denies-reports:120008} hypoglycemic events. ? ?Reported home fasting blood sugars: ***  ?Reported 2 hour post-meal/random blood sugars: ***. ? ?Patient {Actions; denies-reports:120008} nocturia (nighttime urination).  ?Patient {Actions; denies-reports:120008} neuropathy (nerve pain). ?Patient {Actions; denies-reports:120008} visual changes. ?Patient {Actions; denies-reports:120008} self foot exams.  ? ?Patient  reported dietary habits: Eats *** meals/day ?Breakfast: *** ?Lunch: *** ?Dinner: *** ?Snacks: *** ?Drinks: *** ? ?Within the past 12 months, did you worry whether your food would run out before you got money to buy more? {YES NO:22349} ?Within the past 12 months, did the food you bought run out, and you didn?t have money to get more? {YES NO:22349} ? ?Patient-reported exercise habits: *** ? ? ?O:  ? ?7 day average blood glucose: *** ? ?Lab Results  ?Component Value Date  ? HGBA1C 5.2 09/23/2021  ? ?There were no vitals filed for this visit. ? ?Lipid Panel  ?   ?Component Value Date/Time  ? CHOL 147 01/29/2019 1118  ? TRIG 102 01/29/2019 1118  ? HDL 59 01/29/2019 1118  ? CHOLHDL 2.5 01/29/2019 1118  ? CHOLHDL 3 06/18/2014 0951  ? VLDL 19.6 06/18/2014 0951  ? [place] 68 01/29/2019 1118  ? ? ?Clinical Atherosclerotic Cardiovascular Disease (ASCVD): No  ?The 10-year ASCVD risk score (Arnett DK, et al., 2019) is: 13.8% ?  Values used to calculate the score: ?    Age: 50 years ?    Sex: Female ?    Is Non-Hispanic African American: Yes ?    Diabetic: Yes ?    Tobacco smoker: No ?    Systolic Blood Pressure: 211 mmHg ?    Is BP treated: Yes ?    HDL Cholesterol: 59 mg/dL ?    Total Cholesterol: 147 mg/dL  ? ? ?A/P: ?Diabetes longstanding*** currently well controlled with A1c 5.2 (09/23/21). Patient is *** able to verbalize appropriate hypoglycemia management plan. Medication adherence appears ***. Control is suboptimal due to ***. ?-*** ?-Patient educated on purpose, proper use, and potential adverse effects of ***.  ?-Extensively discussed pathophysiology of diabetes, recommended lifestyle interventions, dietary effects on blood sugar control.  ?-Counseled on s/sx of and management of hypoglycemia.  ?-Next A1c anticipated 12/2021.  ? ?  Written patient instructions provided. Patient verbalized understanding of treatment plan. Total time in face to face counseling *** minutes.   ? ?Follow up pharmacist***PCP clinic visit in  ***.  ? ?

## 2021-10-05 ENCOUNTER — Telehealth (HOSPITAL_COMMUNITY): Payer: Self-pay | Admitting: *Deleted

## 2021-10-05 ENCOUNTER — Ambulatory Visit (HOSPITAL_COMMUNITY)
Admit: 2021-10-05 | Discharge: 2021-10-05 | Disposition: A | Discharge status: Home / Self Care | Payer: Medicare Other | Source: Ambulatory Visit | Attending: Adult Health | Admitting: Adult Health

## 2021-10-05 ENCOUNTER — Other Ambulatory Visit: Payer: Self-pay

## 2021-10-05 ENCOUNTER — Ambulatory Visit: Payer: Medicare Other | Admitting: Pharmacist

## 2021-10-05 VITALS — BP 124/60 | HR 94 | Wt 219.0 lb

## 2021-10-05 DIAGNOSIS — E1122 Type 2 diabetes mellitus with diabetic chronic kidney disease: Secondary | ICD-10-CM | POA: Diagnosis not present

## 2021-10-05 DIAGNOSIS — Z79899 Other long term (current) drug therapy: Secondary | ICD-10-CM | POA: Diagnosis not present

## 2021-10-05 DIAGNOSIS — Z6837 Body mass index (BMI) 37.0-37.9, adult: Secondary | ICD-10-CM | POA: Insufficient documentation

## 2021-10-05 DIAGNOSIS — I5032 Chronic diastolic (congestive) heart failure: Secondary | ICD-10-CM | POA: Insufficient documentation

## 2021-10-05 DIAGNOSIS — E669 Obesity, unspecified: Secondary | ICD-10-CM | POA: Insufficient documentation

## 2021-10-05 DIAGNOSIS — G4733 Obstructive sleep apnea (adult) (pediatric): Secondary | ICD-10-CM | POA: Insufficient documentation

## 2021-10-05 DIAGNOSIS — E785 Hyperlipidemia, unspecified: Secondary | ICD-10-CM | POA: Insufficient documentation

## 2021-10-05 DIAGNOSIS — I130 Hypertensive heart and chronic kidney disease with heart failure and stage 1 through stage 4 chronic kidney disease, or unspecified chronic kidney disease: Secondary | ICD-10-CM | POA: Insufficient documentation

## 2021-10-05 DIAGNOSIS — N1832 Chronic kidney disease, stage 3b: Secondary | ICD-10-CM | POA: Insufficient documentation

## 2021-10-05 DIAGNOSIS — R0602 Shortness of breath: Secondary | ICD-10-CM | POA: Insufficient documentation

## 2021-10-05 DIAGNOSIS — Z7984 Long term (current) use of oral hypoglycemic drugs: Secondary | ICD-10-CM | POA: Insufficient documentation

## 2021-10-05 DIAGNOSIS — I509 Heart failure, unspecified: Secondary | ICD-10-CM

## 2021-10-05 DIAGNOSIS — I10 Essential (primary) hypertension: Secondary | ICD-10-CM

## 2021-10-05 LAB — BASIC METABOLIC PANEL
Anion gap: 8 (ref 5–15)
BUN: 45 mg/dL — ABNORMAL HIGH (ref 6–20)
CO2: 26 mmol/L (ref 22–32)
Calcium: 9.7 mg/dL (ref 8.9–10.3)
Chloride: 109 mmol/L (ref 98–111)
Creatinine, Ser: 1.96 mg/dL — ABNORMAL HIGH (ref 0.44–1.00)
GFR, Estimated: 29 mL/min — ABNORMAL LOW (ref >60)
Glucose, Bld: 185 mg/dL — ABNORMAL HIGH (ref 70–99)
Potassium: 4 mmol/L (ref 3.5–5.1)
Sodium: 143 mmol/L (ref 135–145)

## 2021-10-05 MED ORDER — TORSEMIDE 20 MG PO TABS: 40.0000 mg | ORAL_TABLET | Freq: Every day | ORAL | 11 refills | Status: DC

## 2021-10-05 NOTE — Progress Notes (Signed)
? ? ?HEART & VASCULAR TRANSITION OF CARE CONSULT NOTE  ? ? ? ?Referring Physician: Dr Caron Presume  ?Primary Care:Dr Newlin  ?Primary Cardiologist: Dr Percival Spanish  ?Nephrology:  ? ?HPI: ?Referred to clinic by Dr Caron Presume for heart failure consultation.  ? ?[name] [name] is a 61 year old with a history of HFpEF, DMII, HTN, OSA, and hyperlipidemia.  ? ?Had sleep study 08/18/21 mild SOAMild obstructive sleep apnea overall (AHI 13.0/h; RDI 14.6/h); however, sleep apnea was moderately severe during REM sleep (AHI 29.4/h. Severe oxygen desaturation was noted during this study (Min O2 54.0%). Time spent < 89% was 81.7 minutes. ? ?Admitted 09/15/21 with increased shortness of breath. Diuresed with IV lasix and transitioned to torsemide 40 mg daily. Discharged 09/17/21  ? ?Admitted 09/22/21 with with A/C HFpEF. Diuresed with IV lasix and transitioned to torsemide 20 mg daily. Discharge weight 233 pounds. She was discharged on torsemide 20 mg daily howerv ? ?Presented to clinic with her God-daughter. Overall feeling ok. SOB with exertion. Denies PND/Orthopnea. Appetite ok. Eating fast food at least 2 days a week. No fever or chills. She has not been weighing at home. Taking all medications. Getting meds from [facility].  Lives with a friend, daughter, and God -son.  ? ? ?Cardiac Testing  ?09/2021 Echo EF 60-65% Grade IIDD ? ?Review of Systems: [y] = yes, '[ ]'$  = no  ? ?General: Weight gain '[ ]'$ ; Weight loss '[ ]'$ ; Anorexia '[ ]'$ ; Fatigue '[ ]'$ ; Fever '[ ]'$ ; Chills '[ ]'$ ; Weakness '[ ]'$   ?Cardiac: Chest pain/pressure '[ ]'$ ; Resting SOB '[ ]'$ ; Exertional SOB [ Y]; Orthopnea '[ ]'$ ; Pedal Edema '[ ]'$ ; Palpitations '[ ]'$ ; Syncope '[ ]'$ ; Presyncope '[ ]'$ ; Paroxysmal nocturnal dyspnea'[ ]'$   ?Pulmonary: Cough '[ ]'$ ; Wheezing'[ ]'$ ; Hemoptysis'[ ]'$ ; Sputum '[ ]'$ ; Snoring '[ ]'$   ?GI: Vomiting'[ ]'$ ; Dysphagia'[ ]'$ ; Melena'[ ]'$ ; Hematochezia '[ ]'$ ; Heartburn'[ ]'$ ; Abdominal pain '[ ]'$ ; Constipation '[ ]'$ ; Diarrhea '[ ]'$ ; BRBPR '[ ]'$   ?GU: Hematuria'[ ]'$ ; Dysuria '[ ]'$ ; Nocturia'[ ]'$   ?Vascular: Pain in legs with  walking '[ ]'$ ; Pain in feet with lying flat '[ ]'$ ; Non-healing sores '[ ]'$ ; Stroke '[ ]'$ ; TIA '[ ]'$ ; Slurred speech '[ ]'$ ;  ?Neuro: Headaches'[ ]'$ ; Vertigo'[ ]'$ ; Seizures'[ ]'$ ; Paresthesias'[ ]'$ ;Blurred vision '[ ]'$ ; Diplopia '[ ]'$ ; Vision changes '[ ]'$   ?Ortho/Skin: Arthritis '[ ]'$ ; Joint pain [ Y]; Muscle pain '[ ]'$ ; Joint swelling '[ ]'$ ; Back Pain [ Y]; Rash '[ ]'$   ?Psych: Depression'[ ]'$ ; Anxiety'[ ]'$   ?Heme: Bleeding problems '[ ]'$ ; Clotting disorders '[ ]'$ ; Anemia '[ ]'$   ?Endocrine: Diabetes [ Y]; Thyroid dysfunction'[ ]'$  ? ? ?Past Medical History:  ?Diagnosis Date  ? Anemia   ? CHF (congestive heart failure) ([place])   ? Chronic hepatitis C without hepatic coma ([place]) 11/09/2016  ? Diabetes mellitus   ? Fibroids   ? 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 Qualifier: Diagnosis of  By: Jorene Minors, [place]    ? Hypertension   ? MRSA (methicillin resistant Staphylococcus aureus)   ? Trichomonas   ? VAGINITIS, BACTERIAL, RECURRENT 08/15/2007  ? Qualifier: Diagnosis of  By: Radene Ou MD, [place]    ? ? ?Current Outpatient Medications  ?Medication Sig Dispense Refill  ? albuterol (VENTOLIN HFA) 108 (90 Base) MCG/ACT inhaler Inhale 1 puff into the lungs every 4 (four) hours as needed for wheezing or shortness of breath. 18 g 0  ? amLODipine (NORVASC) 2.5 MG tablet Take 1 tablet (2.5 mg total) by mouth daily. 30 tablet 0  ?  atorvastatin (LIPITOR) 40 MG tablet Take 1 tablet (40 mg total) by mouth every morning. Must have HF appt for further refills 90 tablet 0  ? carvedilol (COREG) 12.5 MG tablet Take 1 tablet (12.5 mg total) by mouth 2 (two) times daily. 180 tablet 3  ? empagliflozin (JARDIANCE) 10 MG TABS tablet Take 1 tablet (10 mg total) by mouth daily. 90 tablet 0  ? ferrous sulfate 325 (65 FE) MG tablet Take 325 mg by mouth daily with breakfast.    ? hydrALAZINE (APRESOLINE) 50 MG tablet TAKE ONE TABLET BY MOUTH THREE TIMES DAILY (Patient taking differently: Take 50 mg by mouth 3 (three) times daily.) 270 tablet 1  ? mometasone-formoterol (DULERA) 100-5 MCG/ACT AERO Inhale 2 puffs into  the lungs 2 (two) times daily. 1 each 0  ? nystatin ointment (MYCOSTATIN) Apply 1 application topically 2 (two) times daily. (Patient taking differently: Apply 1 application. topically 2 (two) times daily as needed (rash/skin irritation).) 30 g 2  ? pregabalin (LYRICA) 75 MG capsule TAKE ONE CAPSULE BY MOUTH TWICE DAILY (Patient taking differently: Take 75 mg by mouth 2 (two) times daily.) 60 capsule 4  ? Semaglutide,0.25 or 0.'5MG'$ /DOS, (OZEMPIC, 0.25 OR 0.5 MG/DOSE,) 2 MG/1.5ML SOPN Inject 0.25 mg into the skin once a week. 1.5 mL 0  ? torsemide (DEMADEX) 20 MG tablet Take one tab ('20mg'$ ) by mouth every morning 30 tablet 0  ? ?No current facility-administered medications for this encounter.  ? ? ?Allergies  ?Allergen Reactions  ? Lisinopril Swelling  ? ? ?  ?Social History  ? ?Socioeconomic History  ? Marital status: Legally Separated  ?  Spouse name: Not on file  ? Number of children: 1  ? Years of education: Not on file  ? Highest education level: 9th grade  ?Occupational History  ? Occupation: disability  ?Tobacco Use  ? Smoking status: Never  ? Smokeless tobacco: Never  ?Vaping Use  ? Vaping Use: Never used  ?Substance and Sexual Activity  ? Alcohol use: No  ? Drug use: Not Currently  ?  Types: Cocaine, Marijuana  ?  Comment: remote h/o cocaine 2015 and marijuana 2018  ? Sexual activity: Not Currently  ?Other Topics Concern  ? Not on file  ?Social History Narrative  ? Lives with friend, Rosalee Kaufman and nephew.  One daughter.   ? ?Social Determinants of Health  ? ?Financial Resource Strain: Low Risk   ? Difficulty of Paying Living Expenses: Not very hard  ?Food Insecurity: No Food Insecurity  ? Worried About Charity fundraiser in the Last Year: Never true  ? Ran Out of Food in the Last Year: Never true  ?Transportation Needs: Unmet Transportation Needs  ? Lack of Transportation (Medical): No  ? Lack of Transportation (Non-Medical): Yes  ?Physical Activity: Inactive  ? Days of Exercise per Week: 0 days  ? Minutes of  Exercise per Session: 0 min  ?Stress: No Stress Concern Present  ? Feeling of Stress : Not at all  ?Social Connections: Moderately Isolated  ? Frequency of Communication with Friends and Family: More than three times a week  ? Frequency of Social Gatherings with Friends and Family: More than three times a week  ? Attends Religious Services: More than 4 times per year  ? Active Member of Clubs or Organizations: No  ? Attends Archivist Meetings: Never  ? Marital Status: Separated  ?Intimate Partner Violence: Not At Risk  ? Fear of Current or Ex-Partner: No  ? Emotionally  Abused: No  ? Physically Abused: No  ? Sexually Abused: No  ? ? ?  ?Family History  ?Problem Relation Age of Onset  ? Colon cancer Mother   ? Liver disease Sister   ? Other Neg Hx   ? Breast cancer Neg Hx   ? Esophageal cancer Neg Hx   ? Rectal cancer Neg Hx   ? ? ?Vitals:  ? 10/05/21 1109  ?BP: 124/60  ?Pulse: 94  ?SpO2: 97%  ?Weight: 99.3 kg (219 lb)  ? ?Wt Readings from Last 3 Encounters:  ?10/05/21 99.3 kg (219 lb)  ?10/01/21 102.1 kg (225 lb)  ?09/25/21 105.7 kg (233 lb)  ? ? ? ? ?PHYSICAL EXAM: ?General:  Arrived in a wheelchair.  No respiratory difficulty ?HEENT: normal ?Neck: supple. JVP 10-11 . Carotids 2+ bilat; no bruits. No lymphadenopathy or thryomegaly appreciated. ?Cor: PMI nondisplaced. Regular rate & rhythm. No rubs, gallops or murmurs. ?Lungs: clear ?Abdomen: soft, nontender, nondistended. No hepatosplenomegaly. No bruits or masses. Good bowel sounds. ?Extremities: no cyanosis, clubbing, rash, R and LLE 1+ edema ?Neuro: alert & oriented x 3, cranial nerves grossly intact. moves all 4 extremities w/o difficulty. Affect pleasant. ? ?ECG: SR 95 bpm personally checked  ? ? ?ASSESSMENT & PLAN: ?1. Chronic HFpEF ?-09/2021 Echo EF 60-65% Grade II DD.  ?-NYHA III. Reds Clip 47%. Volume overloaded on exam. Increase torsemide to 60 mg daily x 2 days then back to torsemide 40 mg daily .  ?-Discussed low salt food choices and limiting  fluid intake to < 2 liter per day. Instructed to weigh and record daily.  ?- Next visit consider Arlyce Harman but may not be able to add with CKD Stage IIIb.  ?- Continue jardiance 10 mg daily  ?- Check BMET  ?- Discu

## 2021-10-05 NOTE — Telephone Encounter (Signed)
Call attempted to confirm HV TOC appt today 3/20 @ 11AM. HIPPA appropriate VM left with callback number.  ? ?Earnestine Leys, BSN, RN ?Heart Failure Nurse Navigator ?[phone] ? ?

## 2021-10-05 NOTE — Patient Instructions (Addendum)
Thank you for your visit today. ? ?Torsemide 60 mg daily for 2 days then go to 40 mg daily after that. ? ?Labs done today, your results will be available in MyChart, we will contact you for abnormal readings. ? ?Your physician recommends that you schedule a follow-up appointment in: 1 week back here, then 4 weeks with the clinic. ? ?If you have any questions or concerns before your next appointment please send [place] a message through [place] or call our office at [phone].   ? ?TO LEAVE A MESSAGE FOR THE NURSE SELECT OPTION 2, PLEASE LEAVE A MESSAGE INCLUDING: ?YOUR NAME ?DATE OF BIRTH ?CALL BACK NUMBER ?REASON FOR CALL**this is important as we prioritize the call backs ? ?YOU WILL RECEIVE A CALL BACK THE SAME DAY AS LONG AS YOU CALL BEFORE 4:00 PM ? ?At the [facility], you and your health needs are our priority. As part of our continuing mission to provide you with exceptional heart care, we have created designated Provider Care Teams. These Care Teams include your primary Cardiologist (physician) and Advanced Practice Providers (APPs- Physician Assistants and Nurse Practitioners) who all work together to provide you with the care you need, when you need it.  ? ?You may see any of the following providers on your designated Care Team at your next follow up: ?Dr Glori Bickers ?Dr Loralie Champagne ?Darrick Grinder, NP ?Lyda Jester, PA ?Jessica Milford,NP ?Marlyce Huge, PA ?Audry Riles, PharmD ? ? ?Please be sure to bring in all your medications bottles to every appointment.  ? ? ?

## 2021-10-06 ENCOUNTER — Other Ambulatory Visit: Payer: Self-pay | Admitting: Family Medicine

## 2021-10-06 DIAGNOSIS — I110 Hypertensive heart disease with heart failure: Secondary | ICD-10-CM

## 2021-10-06 DIAGNOSIS — I5032 Chronic diastolic (congestive) heart failure: Secondary | ICD-10-CM

## 2021-10-07 NOTE — Telephone Encounter (Signed)
Requested medications are due for refill today.  unsure ? ?Requested medications are on the active medications list.  no ? ?Last refill. 04/21/2021 ? ?Future visit scheduled.   yes ? ?Notes to clinic.  Medication was discontinued 09/25/2021.  ? ? ? ?Requested Prescriptions  ?Pending Prescriptions Disp Refills  ? spironolactone (ALDACTONE) 25 MG tablet [Pharmacy Med Name: spironolactone 25 mg tablet] 90 tablet 1  ?  Sig: TAKE ONE TABLET BY MOUTH EVERY MORNING  ?  ? Cardiovascular: Diuretics - Aldosterone Antagonist Failed - 10/06/2021  8:02 AM  ?  ?  Failed - Cr in normal range and within 180 days  ?  Creat  ?Date Value Ref Range Status  ?12/12/2012 0.88 0.50 - 1.10 mg/dL Final  ? ?Creatinine, Ser  ?Date Value Ref Range Status  ?10/05/2021 1.96 (H) 0.44 - 1.00 mg/dL Final  ? ?Creatinine,U  ?Date Value Ref Range Status  ?06/13/2007 45.3 mg/dL Final  ?  Comment:  ?  See lab report for associated comment(s)  ? ?Creatinine, Urine  ?Date Value Ref Range Status  ?09/15/2021 47 mg/dL Final  ?  Comment:  ?  Performed at [facility], [address] [place], [place] [address]  ?  ?  ?  ?  Failed - eGFR is 30 or above and within 180 days  ?  GFR calc Af Amer  ?Date Value Ref Range Status  ?11/08/2019 38 (L) >60 mL/min Final  ? ?GFR, Estimated  ?Date Value Ref Range Status  ?10/05/2021 29 (L) >60 mL/min Final  ?  Comment:  ?  (NOTE) ?Calculated using the CKD-EPI Creatinine Equation (2021) ?  ? ?GFR  ?Date Value Ref Range Status  ?06/18/2014 124.66 >60.00 mL/min Final  ? ?eGFR  ?Date Value Ref Range Status  ?10/01/2021 28 (L) >59 mL/min/1.73 Final  ?  ?  ?  ?  Passed - K in normal range and within 180 days  ?  Potassium  ?Date Value Ref Range Status  ?10/05/2021 4.0 3.5 - 5.1 mmol/L Final  ?  ?  ?  ?  Passed - Na in normal range and within 180 days  ?  Sodium  ?Date Value Ref Range Status  ?10/05/2021 143 135 - 145 mmol/L Final  ?10/01/2021 144 134 - 144 mmol/L Final  ?  ?  ?  ?  Passed - Last BP in normal range  ?  BP  Readings from Last 1 Encounters:  ?10/05/21 124/60  ?  ?  ?  ?  Passed - Valid encounter within last 6 months  ?  Recent Outpatient Visits   ? ?      ? 6 days ago Hospital discharge follow-up  ? [facility], [place], [place]  ? 2 weeks ago Acute on chronic congestive heart failure, unspecified heart failure type [facility])  ? [facility] at [facility], Kriste Basque, NP  ? 1 month ago Type 2 diabetes mellitus with other neurologic complication, with long-term current use of insulin ([place])  ? [place], Charlane Ferretti, MD  ? 5 months ago Chronic respiratory failure with hypoxia [facility])  ? [place], [place], MD  ? 6 months ago Encounter for [employer] annual wellness exam  ? [place], MD  ? ?  ?  ?Future Appointments   ? ?        ? In 2 weeks Abbie Sons  L, [place]  ? In 3 weeks [name], MD [place]  ? In 1 month [name], MD [place]  ? In 2 months Lendon Colonel, NP [facility], [facility]  ? ?  ? ?  ?  ?  ?  ?

## 2021-10-13 ENCOUNTER — Encounter (HOSPITAL_COMMUNITY): Payer: Self-pay

## 2021-10-13 ENCOUNTER — Ambulatory Visit (INDEPENDENT_AMBULATORY_CARE_PROVIDER_SITE_OTHER): Payer: Medicare Other | Admitting: Neurology

## 2021-10-13 ENCOUNTER — Encounter: Payer: Self-pay | Admitting: Neurology

## 2021-10-13 VITALS — BP 168/83 | HR 91 | Ht 64.0 in | Wt 211.6 lb

## 2021-10-13 DIAGNOSIS — R251 Tremor, unspecified: Secondary | ICD-10-CM | POA: Diagnosis not present

## 2021-10-13 DIAGNOSIS — R030 Elevated blood-pressure reading, without diagnosis of hypertension: Secondary | ICD-10-CM | POA: Diagnosis not present

## 2021-10-13 NOTE — Patient Instructions (Signed)
You have had recent tremors, no current tremor on my evaluation today. I am glad to hear you have improved.  ?I do not see any signs or symptoms of parkinson's like disease or what we call parkinsonism.  ?For your tremor, I would not recommend any new medications. ?We do not have to make a follow up appointment.  ?Please remember, that any kind of tremor may be exacerbated by anxiety, anger, nervousness, excitement, dehydration, sleep deprivation, thyroid disease, by caffeine, and low blood sugar values or blood sugar fluctuations. Some medications can maker tremors worse, these medications include certain antidepressants and certain inhalers.  ? ?Please try to limit your tea intake to up to 2 servings per day. ?

## 2021-10-13 NOTE — Progress Notes (Signed)
Subjective:  ?  ?Patient ID: NORVA BOWE is a 61 y.o. female. ? ?HPI ? ? ? ?Star Age, MD, PhD ?[facility] ?[address], [address] ?[phone] ?[place], [place] [address] ? ?Dear Dr. Margarita Rana,  ? ?I saw your patient, Doloris Servantes, upon your kind request in my neurologic clinic today for initial consultation of her tremor.  The patient is accompanied by her daughter, [name], today.  As you know, Ms. Dimaano is a 61 year old right-handed woman with an underlying complex medical history of congestive heart failure, diabetes, neuropathy, hypertension, anemia, obesity, and recent hospitalization in March 2023 for acute on chronic congestive heart failure, acute respiratory failure, chronic kidney disease, chronic anemia, who reports an intermittent trembling in her hands for the past few months.  She reports that it is her "nerves".  She reports feeling stressed in the past months but lately, her tremor has improved.  She is not keen on trying any medication for it.  She does have a habit of bobbing her legs which sometimes bothers her family.  She has done this for years.  Sometimes he does wiggles her thighs in and out when sometimes she just pops her feet.  She can stop it when asked to.  She has no family history of Parkinson's disease, her sister may have a hand tremor but sister has multiple medical issues and takes a lot of medications per patient and her daughter. I reviewed your office note from 08/25/2021.  She was noted to have an intermittent tremor at the time in her upper and lower extremities.  ?She drinks caffeine in the form of tea, 3-4 servings per day, some soda but typically noncaffeine soda.  She likes to drink juice as well.  She is diabetic, her A1c has been elevated above 8.  She has not had a TSH in a few months.  She does not drink any alcohol and has not had any illicit drugs in 13 years.  She lives with her daughter, a friend and her godson.  She has not fallen.   Her balance is not as good as it used to be but no specific concerns.  Daughter has not noticed a tremor. ? ?Her Past Medical History Is Significant For: ?Past Medical History:  ?Diagnosis Date  ? Anemia   ? CHF (congestive heart failure) ([place])   ? Chronic hepatitis C without hepatic coma ([place]) 11/09/2016  ? Diabetes mellitus   ? Fibroids   ? 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 Qualifier: Diagnosis of  By: Jorene Minors, [place]    ? Hypertension   ? MRSA (methicillin resistant Staphylococcus aureus)   ? Trichomonas   ? VAGINITIS, BACTERIAL, RECURRENT 08/15/2007  ? Qualifier: Diagnosis of  By: Radene Ou MD, [place]    ? ? ?Her Past Surgical History Is Significant For: ?Past Surgical History:  ?Procedure Laterality Date  ? BREAST BIOPSY Left 2018  ? CESAREAN SECTION    ? breech  ? ? ?Her Family History Is Significant For: ?Family History  ?Problem Relation Age of Onset  ? Colon cancer Mother   ? Liver disease Sister   ? Other Neg Hx   ? Breast cancer Neg Hx   ? Esophageal cancer Neg Hx   ? Rectal cancer Neg Hx   ? Tremor Neg Hx   ? ? ?Her Social History Is Significant For: ?Social History  ? ?Socioeconomic History  ? Marital status: Legally Separated  ?  Spouse name: Not on file  ? Number  of children: 1  ? Years of education: Not on file  ? Highest education level: 9th grade  ?Occupational History  ? Occupation: disability  ?Tobacco Use  ? Smoking status: Never  ? Smokeless tobacco: Never  ?Vaping Use  ? Vaping Use: Never used  ?Substance and Sexual Activity  ? Alcohol use: No  ? Drug use: Not Currently  ?  Types: Cocaine, Marijuana  ?  Comment: remote h/o cocaine 2015 and marijuana 2018  ? Sexual activity: Not Currently  ?Other Topics Concern  ? Not on file  ?Social History Narrative  ? Lives with friend, Rosalee Kaufman and nephew.  One daughter.   ? ?Social Determinants of Health  ? ?Financial Resource Strain: Low Risk   ? Difficulty of Paying Living Expenses: Not very hard  ?Food Insecurity: No Food Insecurity  ? Worried About Charity fundraiser  in the Last Year: Never true  ? Ran Out of Food in the Last Year: Never true  ?Transportation Needs: Unmet Transportation Needs  ? Lack of Transportation (Medical): No  ? Lack of Transportation (Non-Medical): Yes  ?Physical Activity: Inactive  ? Days of Exercise per Week: 0 days  ? Minutes of Exercise per Session: 0 min  ?Stress: No Stress Concern Present  ? Feeling of Stress : Not at all  ?Social Connections: Moderately Isolated  ? Frequency of Communication with Friends and Family: More than three times a week  ? Frequency of Social Gatherings with Friends and Family: More than three times a week  ? Attends Religious Services: More than 4 times per year  ? Active Member of Clubs or Organizations: No  ? Attends Archivist Meetings: Never  ? Marital Status: Separated  ? ? ?Her Allergies Are:  ?Allergies  ?Allergen Reactions  ? Lisinopril Swelling  ?:  ? ?Her Current Medications Are:  ?Outpatient Encounter Medications as of 10/13/2021  ?Medication Sig  ? albuterol (VENTOLIN HFA) 108 (90 Base) MCG/ACT inhaler Inhale 1 puff into the lungs every 4 (four) hours as needed for wheezing or shortness of breath.  ? amLODipine (NORVASC) 2.5 MG tablet Take 1 tablet (2.5 mg total) by mouth daily.  ? atorvastatin (LIPITOR) 40 MG tablet Take 1 tablet (40 mg total) by mouth every morning. Must have HF appt for further refills  ? empagliflozin (JARDIANCE) 10 MG TABS tablet Take 1 tablet (10 mg total) by mouth daily.  ? ferrous sulfate 325 (65 FE) MG tablet Take 325 mg by mouth daily with breakfast.  ? hydrALAZINE (APRESOLINE) 50 MG tablet Take 50 mg by mouth 3 (three) times daily.  ? mometasone-formoterol (DULERA) 100-5 MCG/ACT AERO Inhale 2 puffs into the lungs 2 (two) times daily.  ? nystatin ointment (MYCOSTATIN) Apply 1 application. topically 2 (two) times daily as needed (rash/skin irratation).  ? pregabalin (LYRICA) 75 MG capsule Take 75 mg by mouth 2 (two) times daily.  ? Semaglutide,0.25 or 0.'5MG'$ /DOS, (OZEMPIC, 0.25  OR 0.5 MG/DOSE,) 2 MG/1.5ML SOPN Inject 0.25 mg into the skin once a week.  ? torsemide (DEMADEX) 20 MG tablet Take 2 tablets (40 mg total) by mouth daily. Take one tab ('20mg'$ ) by mouth every morning  ? carvedilol (COREG) 12.5 MG tablet Take 1 tablet (12.5 mg total) by mouth 2 (two) times daily.  ? ?No facility-administered encounter medications on file as of 10/13/2021.  ?: ? ? ?Review of Systems:  ?Out of a complete 14 point review of systems, all are reviewed and negative with the exception of these symptoms  as listed below: ? ? ?Review of Systems  ?Neurological:   ?     Pt is here for tremors. Pt states her tremors are in both hands and they increase when she writes and fatigue. Pt state tremors are  better   ? ?Objective:  ?Neurological Exam ? ?Physical Exam ?Physical Examination:  ? ?Vitals:  ? 10/13/21 0814  ?BP: (!) 168/83  ?Pulse: 91  ? ? ?General Examination: The patient is a very pleasant 61 y.o. female in no acute distress. She appears well-developed and well-nourished and well groomed.  ? ?HEENT: Normocephalic, atraumatic, pupils are equal, round and reactive to light, extraocular tracking is good without limitation to gaze excursion or nystagmus noted. Hearing is grossly intact. Face is symmetric with normal facial animation and normal facial sensation to light touch, temperature and vibration. Speech is clear with no dysarthria noted. There is no hypophonia. There is no lip, neck/head, jaw or voice tremor. Neck is supple with full range of passive and active motion. There are no carotid bruits on auscultation. Oropharynx exam reveals: mild mouth dryness, adequate dental hygiene, missing right front tooth on the top. Tongue protrudes centrally and palate elevates symmetrically.  ? ?Chest: Clear to auscultation without wheezing, rhonchi or crackles noted. ? ?Heart: S1+S2+0, regular and normal without murmurs, rubs or gallops noted.  ? ?Abdomen: Soft, non-tender and non-distended with normal bowel sounds  appreciated on auscultation. ? ?Extremities: There is trace edema in the left ankle.  ? ?Skin: Warm and dry without trophic changes noted.  ? ?Musculoskeletal: exam reveals no obvious joint deformities

## 2021-10-14 ENCOUNTER — Encounter (HOSPITAL_COMMUNITY): Payer: Self-pay

## 2021-10-14 ENCOUNTER — Telehealth (HOSPITAL_COMMUNITY): Payer: Self-pay | Admitting: *Deleted

## 2021-10-14 ENCOUNTER — Ambulatory Visit (HOSPITAL_COMMUNITY)
Admission: RE | Admit: 2021-10-14 | Discharge: 2021-10-14 | Disposition: A | Discharge status: Home / Self Care | Payer: Medicare Other | Source: Ambulatory Visit | Attending: Adult Health | Admitting: Adult Health

## 2021-10-14 VITALS — BP 126/70 | HR 91 | Wt 211.0 lb

## 2021-10-14 DIAGNOSIS — G4733 Obstructive sleep apnea (adult) (pediatric): Secondary | ICD-10-CM | POA: Insufficient documentation

## 2021-10-14 DIAGNOSIS — Z79899 Other long term (current) drug therapy: Secondary | ICD-10-CM | POA: Insufficient documentation

## 2021-10-14 DIAGNOSIS — I130 Hypertensive heart and chronic kidney disease with heart failure and stage 1 through stage 4 chronic kidney disease, or unspecified chronic kidney disease: Secondary | ICD-10-CM | POA: Diagnosis not present

## 2021-10-14 DIAGNOSIS — E669 Obesity, unspecified: Secondary | ICD-10-CM | POA: Diagnosis not present

## 2021-10-14 DIAGNOSIS — Z7984 Long term (current) use of oral hypoglycemic drugs: Secondary | ICD-10-CM | POA: Diagnosis not present

## 2021-10-14 DIAGNOSIS — E785 Hyperlipidemia, unspecified: Secondary | ICD-10-CM | POA: Insufficient documentation

## 2021-10-14 DIAGNOSIS — Z6836 Body mass index (BMI) 36.0-36.9, adult: Secondary | ICD-10-CM | POA: Diagnosis not present

## 2021-10-14 DIAGNOSIS — E1122 Type 2 diabetes mellitus with diabetic chronic kidney disease: Secondary | ICD-10-CM | POA: Insufficient documentation

## 2021-10-14 DIAGNOSIS — R0602 Shortness of breath: Secondary | ICD-10-CM | POA: Insufficient documentation

## 2021-10-14 DIAGNOSIS — I5032 Chronic diastolic (congestive) heart failure: Secondary | ICD-10-CM | POA: Diagnosis not present

## 2021-10-14 DIAGNOSIS — I5022 Chronic systolic (congestive) heart failure: Secondary | ICD-10-CM | POA: Diagnosis not present

## 2021-10-14 DIAGNOSIS — N1832 Chronic kidney disease, stage 3b: Secondary | ICD-10-CM | POA: Diagnosis not present

## 2021-10-14 LAB — BASIC METABOLIC PANEL
Anion gap: 12 (ref 5–15)
BUN: 72 mg/dL — ABNORMAL HIGH (ref 6–20)
CO2: 21 mmol/L — ABNORMAL LOW (ref 22–32)
Calcium: 9.7 mg/dL (ref 8.9–10.3)
Chloride: 107 mmol/L (ref 98–111)
Creatinine, Ser: 1.92 mg/dL — ABNORMAL HIGH (ref 0.44–1.00)
GFR, Estimated: 29 mL/min — ABNORMAL LOW (ref >60)
Glucose, Bld: 163 mg/dL — ABNORMAL HIGH (ref 70–99)
Potassium: 3.6 mmol/L (ref 3.5–5.1)
Sodium: 140 mmol/L (ref 135–145)

## 2021-10-14 MED ORDER — TORSEMIDE 20 MG PO TABS
ORAL_TABLET | ORAL | 6 refills | Status: DC
Start: 2021-10-14 — End: 2021-12-01

## 2021-10-14 NOTE — Telephone Encounter (Signed)
Called to confirm Heart & Vascular Transitions of Care appointment at 10/14/21 @ 3pm.. Patient reminded to bring all medications and pill box organizer with them. Confirmed patient has transportation. Gave directions, instructed to utilize [place] parking. ? ?Confirmed appointment prior to ending call.   ? ?Earnestine Leys, BSN, RN ?Heart Failure Nurse Navigator ?[phone]  ?

## 2021-10-14 NOTE — Patient Instructions (Signed)
INCREASE Torsemide to 60 mg (3 tabs) daily alternating with 40 mg (2 tabs) daily ? ?Labs today ?We will only contact you if something comes back abnormal or we need to make some changes. ?Otherwise no news is good news! ? ?Keep cardiology follow up as scheduled ? ? ?Do the following things EVERYDAY: ?Weigh yourself in the morning before breakfast. Write it down and keep it in a log. ?Take your medicines as prescribed ?Eat low salt foods--Limit salt (sodium) to 2000 mg per day.  ?Stay as active as you can everyday ?Limit all fluids for the day to less than 2 liters ? ? ?At the [facility], you and your health needs are our priority. As part of our continuing mission to provide you with exceptional heart care, we have created designated Provider Care Teams. These Care Teams include your primary Cardiologist (physician) and Advanced Practice Providers (APPs- Physician Assistants and Nurse Practitioners) who all work together to provide you with the care you need, when you need it.  ? ?You may see any of the following providers on your designated Care Team at your next follow up: ?Dr Glori Bickers ?Dr Loralie Champagne ?Darrick Grinder, NP ?Lyda Jester, PA ?Jessica Milford,NP ?Marlyce Huge, PA ?Audry Riles, PharmD ? ? ?Please be sure to bring in all your medications bottles to every appointment.  ? ?If you have any questions or concerns before your next appointment please send [place] a message through [place] or call our office at [phone].   ? ?TO LEAVE A MESSAGE FOR THE NURSE SELECT OPTION 2, PLEASE LEAVE A MESSAGE INCLUDING: ?YOUR NAME ?DATE OF BIRTH ?CALL BACK NUMBER ?REASON FOR CALL**this is important as we prioritize the call backs ? ?YOU WILL RECEIVE A CALL BACK THE SAME DAY AS LONG AS YOU CALL BEFORE 4:00 PM ? ? ? ?

## 2021-10-14 NOTE — Progress Notes (Signed)
ReDS Vest / Clip - 10/14/21 1500   ? ?  ? ReDS Vest / Clip  ? Station Marker B   ? Ruler Value 35   ? ReDS Value Range Moderate volume overload   ? ReDS Actual Value 38   ? Anatomical Comments sitting   ? ?  ?  ? ?  ? ? ?

## 2021-10-14 NOTE — Progress Notes (Signed)
? ? ?HEART & VASCULAR TRANSITION OF CARE PROGRESS NOTE  ? ? ? ?Referring Physician: Dr Caron Presume  ?Primary Care:Dr Newlin  ?Primary Cardiologist: Dr Percival Spanish  ? ? ?HPI: ?Referred to clinic by Dr Caron Presume for heart failure consultation.  ? ?[name] [name] is a 61 year old with a history of HFpEF, DMII, HTN, OSA, and hyperlipidemia.  ? ?Had sleep study 08/18/21 that demonstrated mild obstructive sleep apnea overall (AHI 13.0/h; RDI 14.6/h); however, sleep apnea was moderately severe during REM sleep (AHI 29.4/h. Severe oxygen desaturation was noted during this study (Min O2 54.0%). Time spent < 89% was 81.7 minutes. ? ?Admitted 09/15/21 with increased shortness of breath. Diuresed with IV lasix and transitioned to torsemide 40 mg daily. Discharged 09/17/21  ? ?Admitted 09/22/21 with with A/C HFpEF. Diuresed with IV lasix and transitioned to torsemide 20 mg daily. Discharge weight 233 pounds. She was discharged on torsemide 20 mg daily. Referred to [facility] clinic.  ? ?Had post hospital f/u on 3/20 and complained of SOB w/ exertion, in the setting of dietary indiscretion w/ sodium. She was volume overloaded and exam w/ elevated ReDs 47%. She was instructed to increase torsemide to 60 mg daily x 2 days then back to torsemide 40 mg daily. CardioMEMs was also discussed.  ? ?She returns today for volume assessment. ReDS clip remains elevated but overall improved down to 38% today. Wt down 8 lb. Still SOB w/ activities, NYHA Class II. Has to take her time. Denies resting dyspnea. No orthopnea/PND. Reports full med compliance. Trying to be more mindful w/ sodium intake. BP well controlled. She remains undecided about CardioMEMs.  ? ? ?Cardiac Testing  ?09/2021 Echo EF 60-65% Grade IIDD ? ?Review of Systems: [y] = yes, '[ ]'$  = no  ? ?General: Weight gain '[ ]'$ ; Weight loss '[ ]'$ ; Anorexia '[ ]'$ ; Fatigue '[ ]'$ ; Fever '[ ]'$ ; Chills '[ ]'$ ; Weakness '[ ]'$   ?Cardiac: Chest pain/pressure '[ ]'$ ; Resting SOB '[ ]'$ ; Exertional SOB [ Y]; Orthopnea '[ ]'$ ; Pedal Edema '[ ]'$ ;  Palpitations '[ ]'$ ; Syncope '[ ]'$ ; Presyncope '[ ]'$ ; Paroxysmal nocturnal dyspnea'[ ]'$   ?Pulmonary: Cough '[ ]'$ ; Wheezing'[ ]'$ ; Hemoptysis'[ ]'$ ; Sputum '[ ]'$ ; Snoring '[ ]'$   ?GI: Vomiting'[ ]'$ ; Dysphagia'[ ]'$ ; Melena'[ ]'$ ; Hematochezia '[ ]'$ ; Heartburn'[ ]'$ ; Abdominal pain '[ ]'$ ; Constipation '[ ]'$ ; Diarrhea '[ ]'$ ; BRBPR '[ ]'$   ?GU: Hematuria'[ ]'$ ; Dysuria '[ ]'$ ; Nocturia'[ ]'$   ?Vascular: Pain in legs with walking '[ ]'$ ; Pain in feet with lying flat '[ ]'$ ; Non-healing sores '[ ]'$ ; Stroke '[ ]'$ ; TIA '[ ]'$ ; Slurred speech '[ ]'$ ;  ?Neuro: Headaches'[ ]'$ ; Vertigo'[ ]'$ ; Seizures'[ ]'$ ; Paresthesias'[ ]'$ ;Blurred vision '[ ]'$ ; Diplopia '[ ]'$ ; Vision changes '[ ]'$   ?Ortho/Skin: Arthritis '[ ]'$ ; Joint pain [ Y]; Muscle pain '[ ]'$ ; Joint swelling '[ ]'$ ; Back Pain [ Y]; Rash '[ ]'$   ?Psych: Depression'[ ]'$ ; Anxiety'[ ]'$   ?Heme: Bleeding problems '[ ]'$ ; Clotting disorders '[ ]'$ ; Anemia '[ ]'$   ?Endocrine: Diabetes [ Y]; Thyroid dysfunction'[ ]'$  ? ? ?Past Medical History:  ?Diagnosis Date  ? Anemia   ? CHF (congestive heart failure) ([place])   ? Chronic hepatitis C without hepatic coma ([place]) 11/09/2016  ? Diabetes mellitus   ? Fibroids   ? 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 Qualifier: Diagnosis of  By: Jorene Minors, [place]    ? Hypertension   ? MRSA (methicillin resistant Staphylococcus aureus)   ? Trichomonas   ? VAGINITIS, BACTERIAL, RECURRENT 08/15/2007  ? Qualifier: Diagnosis of  By: Radene Ou MD,  [place]    ? ? ?Current Outpatient Medications  ?Medication Sig Dispense Refill  ? albuterol (VENTOLIN HFA) 108 (90 Base) MCG/ACT inhaler Inhale 1 puff into the lungs every 4 (four) hours as needed for wheezing or shortness of breath. 18 g 0  ? amLODipine (NORVASC) 2.5 MG tablet Take 1 tablet (2.5 mg total) by mouth daily. 30 tablet 0  ? atorvastatin (LIPITOR) 40 MG tablet Take 1 tablet (40 mg total) by mouth every morning. Must have HF appt for further refills 90 tablet 0  ? carvedilol (COREG) 12.5 MG tablet Take 1 tablet (12.5 mg total) by mouth 2 (two) times daily. 180 tablet 3  ? empagliflozin (JARDIANCE) 10 MG TABS tablet Take 1 tablet (10  mg total) by mouth daily. 90 tablet 0  ? ferrous sulfate 325 (65 FE) MG tablet Take 325 mg by mouth daily with breakfast.    ? hydrALAZINE (APRESOLINE) 50 MG tablet Take 50 mg by mouth 3 (three) times daily.    ? mometasone-formoterol (DULERA) 100-5 MCG/ACT AERO Inhale 2 puffs into the lungs 2 (two) times daily. 1 each 0  ? nystatin ointment (MYCOSTATIN) Apply 1 application. topically 2 (two) times daily as needed (rash/skin irratation).    ? pregabalin (LYRICA) 75 MG capsule Take 75 mg by mouth 2 (two) times daily.    ? Semaglutide,0.25 or 0.'5MG'$ /DOS, (OZEMPIC, 0.25 OR 0.5 MG/DOSE,) 2 MG/1.5ML SOPN Inject 0.25 mg into the skin once a week. 1.5 mL 0  ? torsemide (DEMADEX) 20 MG tablet Take 60 mg (3 tabs) daily alternating with 40 mg (2 tabs) daily 150 tablet 6  ? ?No current facility-administered medications for this encounter.  ? ? ?Allergies  ?Allergen Reactions  ? Lisinopril Swelling  ? ? ?  ?Social History  ? ?Socioeconomic History  ? Marital status: Legally Separated  ?  Spouse name: Not on file  ? Number of children: 1  ? Years of education: Not on file  ? Highest education level: 9th grade  ?Occupational History  ? Occupation: disability  ?Tobacco Use  ? Smoking status: Never  ? Smokeless tobacco: Never  ?Vaping Use  ? Vaping Use: Never used  ?Substance and Sexual Activity  ? Alcohol use: No  ? Drug use: Not Currently  ?  Types: Cocaine, Marijuana  ?  Comment: remote h/o cocaine 2015 and marijuana 2018  ? Sexual activity: Not Currently  ?Other Topics Concern  ? Not on file  ?Social History Narrative  ? Lives with friend, Rosalee Kaufman and nephew.  One daughter.   ? ?Social Determinants of Health  ? ?Financial Resource Strain: Low Risk   ? Difficulty of Paying Living Expenses: Not very hard  ?Food Insecurity: No Food Insecurity  ? Worried About Charity fundraiser in the Last Year: Never true  ? Ran Out of Food in the Last Year: Never true  ?Transportation Needs: Unmet Transportation Needs  ? Lack of Transportation  (Medical): No  ? Lack of Transportation (Non-Medical): Yes  ?Physical Activity: Inactive  ? Days of Exercise per Week: 0 days  ? Minutes of Exercise per Session: 0 min  ?Stress: No Stress Concern Present  ? Feeling of Stress : Not at all  ?Social Connections: Moderately Isolated  ? Frequency of Communication with Friends and Family: More than three times a week  ? Frequency of Social Gatherings with Friends and Family: More than three times a week  ? Attends Religious Services: More than 4 times per year  ? Active Member  of Clubs or Organizations: No  ? Attends Archivist Meetings: Never  ? Marital Status: Separated  ?Intimate Partner Violence: Not At Risk  ? Fear of Current or Ex-Partner: No  ? Emotionally Abused: No  ? Physically Abused: No  ? Sexually Abused: No  ? ? ?  ?Family History  ?Problem Relation Age of Onset  ? Colon cancer Mother   ? Liver disease Sister   ? Other Neg Hx   ? Breast cancer Neg Hx   ? Esophageal cancer Neg Hx   ? Rectal cancer Neg Hx   ? Tremor Neg Hx   ? ? ?Vitals:  ? 10/14/21 1513  ?BP: 126/70  ?Pulse: 91  ?SpO2: 95%  ?Weight: 95.7 kg (211 lb)  ? ? ?Wt Readings from Last 3 Encounters:  ?10/14/21 95.7 kg (211 lb)  ?10/13/21 96 kg (211 lb 9.6 oz)  ?10/05/21 99.3 kg (219 lb)  ? ? ReDS Vest / Clip - 10/14/21 1500   ? ?  ? ReDS Vest / Clip  ? Station Marker B   ? Ruler Value 35   ? ReDS Value Range Moderate volume overload   ? ReDS Actual Value 38   ? Anatomical Comments sitting   ? ?  ?  ? ?  ? ? ?PHYSICAL EXAM: ?ReDs Clip 38%  ?General:  Well appearing. No respiratory difficulty ?HEENT: normal ?Neck: supple. JVD 7-8 cm. Carotids 2+ bilat; no bruits. No lymphadenopathy or thyromegaly appreciated. ?Cor: PMI nondisplaced. Regular rate & rhythm. 2/6 SEM  ?Lungs: clear ?Abdomen: soft, nontender, nondistended. No hepatosplenomegaly. No bruits or masses. Good bowel sounds. ?Extremities: no cyanosis, clubbing, rash, edema ?Neuro: alert & oriented x 3, cranial nerves grossly intact. moves  all 4 extremities w/o difficulty. Affect pleasant. ? ? ?ECG: Not performed  ? ?ASSESSMENT & PLAN: ? ?1. Chronic HFpEF ?-09/2021 Echo EF 60-65% Grade II DD.  ?-NYHA II-early III. Reds Clip 38%. C/w high s

## 2021-10-15 ENCOUNTER — Other Ambulatory Visit: Payer: Self-pay | Admitting: Family Medicine

## 2021-10-15 DIAGNOSIS — I110 Hypertensive heart disease with heart failure: Secondary | ICD-10-CM

## 2021-10-22 ENCOUNTER — Other Ambulatory Visit: Payer: Self-pay | Admitting: Obstetrics and Gynecology

## 2021-10-22 ENCOUNTER — Ambulatory Visit: Payer: Medicare Other | Admitting: Pharmacist

## 2021-10-22 NOTE — Patient Instructions (Signed)
Hi [name] [name], nice to catch up with you today-I am glad you are feeling better!! ? ?[name] [name] was given information about Medicaid Managed Care team care coordination services as a part of their [facility] Medicaid benefit. [name] [name] verbally consented to engagement with the [facility] Managed Care team.  ? ?If you are experiencing a medical emergency, please call 911 or report to your local emergency department or urgent care.  ? ?If you have a non-emergency medical problem during routine business hours, please contact your provider's office and ask to speak with a nurse.  ? ?For questions related to your [facility], please call: [phone] or visit the homepage here: [email or web address] ? ?If you would like to schedule transportation through your [facility], please call the following number at least 2 days in advance of your appointment: [phone]. ? Rides for urgent appointments can also be made after hours by calling Member Services. ? ?Call the [place] at [phone], at any time, 24 hours a day, 7 days a week. If you are in danger or need immediate medical attention call 911. ? ?If you would like help to quit smoking, call 1-800-QUIT-NOW 540-768-6385) OR Espa?ol: 1-855-D?jelo-Ya 979-368-8960) o para m?s informaci?n haga clic aqu? or Text READY to 200-400 to register via text ? ?[name] [name] - following are the goals we discussed in your visit today:  ? Goals Addressed   ? ?  ?  ?  ?  ?  ? This Visit's Progress  ?        ?     ?   Protect My Health     ?   Timeframe:  Long-Range Goal ?Priority:  Medium ?Start Date:          10/29/20                   ?Expected End Date: ongoing            ? ?Follow Up Date: 11/26/21 ?  ?- schedule appointment for flu shot ?- schedule appointment for vaccines needed due to my age or health ?- schedule  recommended health tests (blood work, mammogram, colonoscopy, pap test) ?- schedule and keep appointment for annual check-up  ? ?10/22/21:  patient seen and evaluated by CARDS 3/29, Neuro 3/28, and PCP 3/16 ? ?Why is this important?   ?Screening tests can find diseases early when they are easier to treat.  ?Your doctor or nurse will talk with you about which tests are important for you.  ?Getting shots for common diseases like the flu and shingles will help prevent them.    ? ?Patient verbalizes understanding of instructions and care plan provided today and agrees to view in [place]. Active MyChart status confirmed with patient.   ? ?The Managed Medicaid care management team will reach out to the patient again over the next 30 days.  ?The  Patient  has been provided with contact information for the Managed Medicaid care management team and has been advised to call with any health related questions or concerns.  ? ?[name] RN, BSN ?[facility] ?Care Management Coordinator - Managed Medicaid High Risk ?[phone] ?  ?Following is a copy of your plan of care:  ?Care Plan : General Plan of Care (Adult)  ?Updates made by Gayla Medicus, RN since 10/22/2021 12:00 AM  ? ? ?Problem: Health Promotion or Disease Self-Management (General Plan of  Care) Resolved 10/22/2021  ?Priority: High  ?Onset Date: 05/28/2020  ? ?Long-Range Goal: Quality of Life Maintained   ?Start Date: 10/29/2020  ?Expected End Date: 12/22/2021  ?Recent Progress: Not on track  ?Priority: High  ?Note:   ? CARE PLAN ENTRY ?Medicaid Managed Care ?(see longitudinal plan of care for additional care plan information) ? ?Current Barriers:  ?Patient hospitalized 09/22/21-09/25/21 for CHF.  Patient denies any SOB or swelling.  Weighs daily-increase 1 pound today.  Does not check blood pressure and checks blood sugars occasionally.   ? ?Nurse Case Manager Clinical Goal(s):  ?Over the next 30 days, patient will verbalize understanding of plan  for decreased swelling.  ?Over the next 30 days, patient will work with provider to address needs  ?Over the next 30 days, patient will continue to work with Pharmacist. ?Over the next 30 days, patient will attend all scheduled appointments. ? ?Interventions:  ?Inter-disciplinary care team collaboration (see longitudinal plan of care) ?Reviewed medications with patient. ?Discussed plans with patient for ongoing care management follow up and provided patient with direct contact information for care management team. ?Collaboration with Pharmacist for review of medications. ?Pharmacy referral for medication review. ?Collaborated with BSW for resources. ?BSW  Referral for resources-completed ? ?Plan:  ?Patient will follow up with provider and fill all prescriptions ?RNCM will follow up with patient within 30 days  ?  ?

## 2021-10-22 NOTE — Patient Outreach (Signed)
Medicaid Managed Care   Nurse Care Manager Note  10/22/2021 Name:  [name] [name] MRN:  [id number] DOB:  22-Apr-1961  [name] [name] is an 61 y.o. year old female who is a primary patient of Hoy Register, MD.  The [facility] Managed Care Coordination team was consulted for assistance with:    Chronic healthcare management needs, DM, GERD, CKD, OSA, CHF, COPD  Ms. [name] was given information about Medicaid Managed Care Coordination team services today. [place] Patient agreed to services and verbal consent obtained.  Engaged with patient by telephone for follow up visit in response to provider referral for case management and/or care coordination services.   Assessments/Interventions:  Review of past medical history, allergies, medications, health status, including review of consultants reports, laboratory and other test data, was performed as part of comprehensive evaluation and provision of chronic care management services.  SDOH (Social Determinants of Health) assessments and interventions performed: SDOH Interventions    Flowsheet Row Most Recent Value  SDOH Interventions   Housing Interventions Intervention Not Indicated      Care Plan  Allergies  Allergen Reactions   Lisinopril Swelling    Medications Reviewed Today     Reviewed by Danie Chandler, RN (Registered Nurse) on 10/22/21 at 1537  Med List Status: <None>   Medication Order Taking? Sig Documenting Provider Last Dose Status Informant  albuterol (VENTOLIN HFA) 108 (90 Base) MCG/ACT inhaler [id number] No Inhale 1 puff into the lungs every 4 (four) hours as needed for wheezing or shortness of breath. Hoy Register, MD Taking Active Self, Spouse/Significant Other  amLODipine (NORVASC) 2.5 MG tablet [id number] No Take 1 tablet (2.5 mg total) by mouth daily. Alford Highland, MD Taking Active Self, Spouse/Significant Other  atorvastatin (LIPITOR) 40 MG tablet [id number] No Take 1 tablet (40 mg total) by mouth  every morning. Must have HF appt for further refills Delma Freeze, FNP Taking Active Self, Spouse/Significant Other           Med Note (COFFELL, ANGELA M   Wed Sep 23, 2021  8:33 AM)    carvedilol (COREG) 12.5 MG tablet [id number] No Take 1 tablet (12.5 mg total) by mouth 2 (two) times daily. Rollene Rotunda, MD Taking Expired 10/14/21 2359 Self, Spouse/Significant Other  empagliflozin (JARDIANCE) 10 MG TABS tablet [id number] No Take 1 tablet (10 mg total) by mouth daily. Cora Collum, DO Taking Active   ferrous sulfate 325 (65 FE) MG tablet [id number] No Take 325 mg by mouth daily with breakfast. [provider] Taking Active   hydrALAZINE (APRESOLINE) 50 MG tablet [id number] No Take 50 mg by mouth 3 (three) times daily. [provider] Taking Active   mometasone-formoterol (DULERA) 100-5 MCG/ACT AERO [id number] No Inhale 2 puffs into the lungs 2 (two) times daily. Alford Highland, MD Taking Active Self, Spouse/Significant Other  nystatin ointment (MYCOSTATIN) [id number] No Apply 1 application. topically 2 (two) times daily as needed (rash/skin irratation). [provider] Taking Active   pregabalin (LYRICA) 75 MG capsule [id number] No Take 75 mg by mouth 2 (two) times daily. [provider] Taking Active   Semaglutide,0.25 or 0.5MG /DOS, (OZEMPIC, 0.25 OR 0.5 MG/DOSE,) 2 MG/1.5ML SOPN [id number] No Inject 0.25 mg into the skin once a week. Cora Collum, DO Taking Active   torsemide (DEMADEX) 20 MG tablet [id number]  Take 60 mg (3 tabs) daily alternating with 40 mg (2 tabs) daily Allayne Butcher, PA-C  Active  Patient Active Problem List   Diagnosis Date Noted   Acute on chronic heart failure with preserved ejection fraction (HFpEF) (HCC) 09/22/2021   Heart failure (HCC) 09/22/2021   CKD (chronic kidney disease) stage 4, GFR 15-29 ml/min (HCC) 07/03/2021   Acute respiratory failure due to COVID-19 (HCC) 05/06/2021   COVID-19 05/05/2021    CHF exacerbation (HCC) 04/02/2021   Chest pain 04/02/2021   Acute-on-chronic kidney injury (HCC) 04/02/2021   COPD exacerbation (HCC) 12/05/2020   CKD (chronic kidney disease), stage III (HCC) 12/04/2020   Anemia of chronic disease 12/04/2020   Obesity, Class III, BMI 40-49.9 (morbid obesity) (HCC) 07/27/2020   Acute respiratory disease due to COVID-19 virus 07/25/2020   Acute hypoxemic respiratory failure due to COVID-19 (HCC) 07/25/2020   Morbid obesity (HCC) 11/23/2019   Acute respiratory failure with hypoxia (HCC) 11/06/2019   Chronic kidney disease, stage 3b (HCC) 11/06/2019   COPD without exacerbation (HCC)    Acute kidney failure (HCC) 08/18/2019   Left ventricular hypertrophy 07/21/2019   Educated about COVID-19 virus infection 07/21/2019   Acute diastolic HF (heart failure) (HCC)    Hypoxia    OSA (obstructive sleep apnea) 05/09/2019   Hyperglycemia 04/09/2019   Snoring 03/13/2019   Chronic anemia 08/17/2018   Chronic cystitis 08/03/2018   Diabetic neuropathy (HCC) 06/16/2017   Non compliance w medication regimen 04/12/2017   Family history of colon cancer in mother 11/17/2016   Dyspareunia in female 11/11/2016   Screen for colon cancer 11/11/2016   History of ovarian cyst 11/11/2016   Chronic hepatitis C without hepatic coma (HCC) 11/09/2016   Hyperlipidemia 10/07/2016   Type 2 diabetes mellitus with hyperlipidemia (HCC) 01/29/2014   Status post intraocular lens implant 11/08/2013   PCO (posterior capsular opacification) 09/28/2013   Pseudophakia 09/12/2013   GERD (gastroesophageal reflux disease) 09/06/2013   Hypertension 09/06/2013   ANEMIA, IRON DEFICIENCY 10/03/2009   LIPOMA 06/18/2009   TINEA PEDIS 05/07/2009   SUBSTANCE ABUSE, MULTIPLE 02/22/2007   HCVD (hypertensive cardiovascular disease) 02/22/2007   Conditions to be addressed/monitored per PCP order:  Chronic healthcare management needs, DM, GERD, CKD, OSA, CHF, COPD, HLD, HTN  Care Plan : General Plan  of Care (Adult)  Updates made by Danie Chandler, RN since 10/22/2021 12:00 AM    Problem: Health Promotion or Disease Self-Management (General Plan of Care) Resolved 10/22/2021  Priority: High  Onset Date: 05/28/2020     Long-Range Goal: Quality of Life Maintained   Start Date: 10/29/2020  Expected End Date: 12/22/2021  Recent Progress: Not on track  Priority: High  Note:    CARE PLAN ENTRY Medicaid Managed Care (see longitudinal plan of care for additional care plan information)  Current Barriers:  Patient hospitalized 09/22/21-09/25/21 for CHF.  Patient denies any SOB or swelling.  Weighs daily-increase 1 pound today.  Does not check blood pressure and checks blood sugars occasionally.    Nurse Case Manager Clinical Goal(s):  Over the next 30 days, patient will verbalize understanding of plan for decreased swelling.  Over the next 30 days, patient will work with provider to address needs  Over the next 30 days, patient will continue to work with Pharmacist. Over the next 30 days, patient will attend all scheduled appointments. Over the next 30 days, patient will check blood pressure and blood sugar on a regular basis and continue to weigh daily.  Interventions:  Inter-disciplinary care team collaboration (see longitudinal plan of care) Reviewed medications with patient. Discussed plans with patient for ongoing  care management follow up and provided patient with direct contact information for care management team. Collaboration with Pharmacist for review of medications. Pharmacy referral for medication review. Collaborated with BSW for resources. BSW  Referral for resources-completed  Plan:  Patient will follow up with provider and fill all prescriptions RNCM will follow up with patient within 30 days.   Follow Up:  Patient agrees to Care Plan and Follow-up.  Plan: The Managed Medicaid care management team will reach out to the patient again over the next 30 days. and The  Patient  has been provided with contact information for the Managed Medicaid care management team and has been advised to call with any health related questions or concerns.  Date/time of next scheduled RN care management/care coordination outreach:  11/26/21 at 315

## 2021-10-29 DIAGNOSIS — H4313 Vitreous hemorrhage, bilateral: Secondary | ICD-10-CM | POA: Diagnosis not present

## 2021-10-29 DIAGNOSIS — H3589 Other specified retinal disorders: Secondary | ICD-10-CM | POA: Diagnosis not present

## 2021-10-29 DIAGNOSIS — E113513 Type 2 diabetes mellitus with proliferative diabetic retinopathy with macular edema, bilateral: Secondary | ICD-10-CM | POA: Diagnosis not present

## 2021-10-29 DIAGNOSIS — Z961 Presence of intraocular lens: Secondary | ICD-10-CM | POA: Diagnosis not present

## 2021-10-29 DIAGNOSIS — E113511 Type 2 diabetes mellitus with proliferative diabetic retinopathy with macular edema, right eye: Secondary | ICD-10-CM | POA: Diagnosis not present

## 2021-11-02 ENCOUNTER — Encounter: Payer: Self-pay | Admitting: Family Medicine

## 2021-11-02 ENCOUNTER — Ambulatory Visit: Payer: Medicare Other | Attending: Family Medicine | Admitting: Family Medicine

## 2021-11-02 VITALS — BP 140/84 | HR 87 | Resp 16 | Wt 213.2 lb

## 2021-11-02 DIAGNOSIS — E1122 Type 2 diabetes mellitus with diabetic chronic kidney disease: Secondary | ICD-10-CM

## 2021-11-02 DIAGNOSIS — I5032 Chronic diastolic (congestive) heart failure: Secondary | ICD-10-CM

## 2021-11-02 DIAGNOSIS — Z794 Long term (current) use of insulin: Secondary | ICD-10-CM | POA: Diagnosis not present

## 2021-11-02 DIAGNOSIS — N1832 Chronic kidney disease, stage 3b: Secondary | ICD-10-CM

## 2021-11-02 DIAGNOSIS — I129 Hypertensive chronic kidney disease with stage 1 through stage 4 chronic kidney disease, or unspecified chronic kidney disease: Secondary | ICD-10-CM

## 2021-11-02 DIAGNOSIS — E1149 Type 2 diabetes mellitus with other diabetic neurological complication: Secondary | ICD-10-CM | POA: Diagnosis not present

## 2021-11-02 DIAGNOSIS — I110 Hypertensive heart disease with heart failure: Secondary | ICD-10-CM

## 2021-11-02 LAB — GLUCOSE, POCT (MANUAL RESULT ENTRY): POC Glucose: 249 mg/dl — AB (ref 70–99)

## 2021-11-02 MED ORDER — HYDRALAZINE HCL 50 MG PO TABS: 50.0000 mg | ORAL_TABLET | Freq: Three times a day (TID) | ORAL | 1 refills | Status: DC

## 2021-11-02 MED ORDER — OZEMPIC (0.25 OR 0.5 MG/DOSE) 2 MG/1.5ML ~~LOC~~ SOPN: 0.5000 mg | PEN_INJECTOR | SUBCUTANEOUS | 6 refills | Status: DC

## 2021-11-02 MED ORDER — ATORVASTATIN CALCIUM 40 MG PO TABS: 40.0000 mg | ORAL_TABLET | Freq: Every morning | ORAL | 1 refills | Status: DC

## 2021-11-02 MED ORDER — PREGABALIN 75 MG PO CAPS: 75.0000 mg | ORAL_CAPSULE | Freq: Two times a day (BID) | ORAL | 1 refills | Status: DC

## 2021-11-02 MED ORDER — CARVEDILOL 12.5 MG PO TABS: 12.5000 mg | ORAL_TABLET | Freq: Two times a day (BID) | ORAL | 1 refills | Status: DC

## 2021-11-02 MED ORDER — AMLODIPINE BESYLATE 2.5 MG PO TABS: 2.5000 mg | ORAL_TABLET | Freq: Every day | ORAL | 1 refills | Status: DC

## 2021-11-02 MED ORDER — EMPAGLIFLOZIN 10 MG PO TABS: 10.0000 mg | ORAL_TABLET | Freq: Every day | ORAL | 1 refills | Status: DC

## 2021-11-02 NOTE — Patient Instructions (Signed)
Exercising to Stay Healthy To become healthy and stay healthy, it is recommended that you do moderate-intensity and vigorous-intensity exercise. You can tell that you are exercising at a moderate intensity if your heart starts beating faster and you start breathing faster but can still hold a conversation. You can tell that you are exercising at a vigorous intensity if you are breathing much harder and faster and cannot hold a conversation while exercising. How can exercise benefit me? Exercising regularly is important. It has many health benefits, such as: Improving overall fitness, flexibility, and endurance. Increasing bone density. Helping with weight control. Decreasing body fat. Increasing muscle strength and endurance. Reducing stress and tension, anxiety, depression, or anger. Improving overall health. What guidelines should I follow while exercising? Before you start a new exercise program, talk with your health care provider. Do not exercise so much that you hurt yourself, feel dizzy, or get very short of breath. Wear comfortable clothes and wear shoes with good support. Drink plenty of water while you exercise to prevent dehydration or heat stroke. Work out until your breathing and your heartbeat get faster (moderate intensity). How often should I exercise? Choose an activity that you enjoy, and set realistic goals. Your health care provider can help you make an activity plan that is individually designed and works best for you. Exercise regularly as told by your health care provider. This may include: Doing strength training two times a week, such as: Lifting weights. Using resistance bands. Push-ups. Sit-ups. Yoga. Doing a certain intensity of exercise for a given amount of time. Choose from these options: A total of 150 minutes of moderate-intensity exercise every week. A total of 75 minutes of vigorous-intensity exercise every week. A mix of moderate-intensity and  vigorous-intensity exercise every week. Children, pregnant women, people who have not exercised regularly, people who are overweight, and older adults may need to talk with a health care provider about what activities are safe to perform. If you have a medical condition, be sure to talk with your health care provider before you start a new exercise program. What are some exercise ideas? Moderate-intensity exercise ideas include: Walking 1 mile (1.6 km) in about 15 minutes. Biking. Hiking. Golfing. Dancing. Water aerobics. Vigorous-intensity exercise ideas include: Walking 4.5 miles (7.2 km) or more in about 1 hour. Jogging or running 5 miles (8 km) in about 1 hour. Biking 10 miles (16.1 km) or more in about 1 hour. Lap swimming. Roller-skating or in-line skating. Cross-country skiing. Vigorous competitive sports, such as football, basketball, and soccer. Jumping rope. Aerobic dancing. What are some everyday activities that can help me get exercise? Yard work, such as: Pushing a lawn mower. Raking and bagging leaves. Washing your car. Pushing a stroller. Shoveling snow. Gardening. Washing windows or floors. How can I be more active in my day-to-day activities? Use stairs instead of an elevator. Take a walk during your lunch break. If you drive, park your car farther away from your work or school. If you take public transportation, get off one stop early and walk the rest of the way. Stand up or walk around during all of your indoor phone calls. Get up, stretch, and walk around every 30 minutes throughout the day. Enjoy exercise with a friend. Support to continue exercising will help you keep a regular routine of activity. Where to find more information You can find more information about exercising to stay healthy from: U.[place] Department of Health and Human Services: www.hhs.gov Centers for Disease Control and Prevention (  CDC): www.cdc.gov Summary Exercising regularly is  important. It will improve your overall fitness, flexibility, and endurance. Regular exercise will also improve your overall health. It can help you control your weight, reduce stress, and improve your bone density. Do not exercise so much that you hurt yourself, feel dizzy, or get very short of breath. Before you start a new exercise program, talk with your health care provider. This information is not intended to replace advice given to you by your health care provider. Make sure you discuss any questions you have with your health care provider. Document Revised: 10/31/2020 Document Reviewed: 10/31/2020 Elsevier Patient Education  2023 [employer]  

## 2021-11-02 NOTE — Progress Notes (Signed)
? ?Subjective:  ?Patient ID: [name] [name], female    DOB: 01/23/1961  Age: 61 y.o. MRN: [id number] ? ?CC: Diabetes and Hypertension ? ? ?HPI ?JELITZA MANNINEN is a 61 y.o. year old female with a history of type 2 diabetes mellitus (A1c 5.2), diabetic neuropathy, hypertension, diabetic retinopathy, hyperlipidemia, hepatitis C (completed treatment with harvoni), OSA, diastolic CHF (EF 60 to 81% from echo of 09/2021), stage III CKD. ?She was hospitalized for acute on chronic CHF last month. ? ?Interval History: ? ?During her hospitalization last month Spironolactone was held due to AKI.  Lantus was discontinued and she was started on Ozempic. ?She had a follow-up visit with the nurse practitioner 1 week postdischarge. ?She also had a visit to the CHF clinic at the end of last month.  Torsemide was increased to alternate between 60 mg daily and 40 mg daily. ?Yet to see her nephrologist for follow-up of her chronic kidney disease but promises to call for an appointment. ? ? ?She was seen by neurology 2 weeks ago for evaluation of her tremor and per notes there was no evidence of parkinsonism and no regimen change was made.  No further follow-up with neurology was recommended. ? ?Today she reports feeling better and has no dyspnea, no chest pain, she no longer needs her walker to ambulate.  Her pedal edema has improved and she is excited about her weight loss of about 27 pounds in the last 3 months. ?Random blood sugars are around  177 at home.  Her blood sugar this morning is 249 at the clinic but she states she had a bologna sandwich not long ago.  She has had no hypoglycemia, her neuropathy is controlled on Lyrica.  Her diabetic retinopathy is managed by ophthalmology with her last visit last week. ? ?She had a visit with the breast surgeon to discuss breast reduction surgery but was informed that had to be placed on hold until her visit with her PCP and cardiology. ?She is accompanied by her boyfriend to today's  visit and has no additional concerns today. ?Past Medical History:  ?Diagnosis Date  ? Anemia   ? CHF (congestive heart failure) ([place])   ? Chronic hepatitis C without hepatic coma ([place]) 11/09/2016  ? Diabetes mellitus   ? Fibroids   ? 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 Qualifier: Diagnosis of  By: Jorene Minors, [place]    ? Hypertension   ? MRSA (methicillin resistant Staphylococcus aureus)   ? Trichomonas   ? VAGINITIS, BACTERIAL, RECURRENT 08/15/2007  ? Qualifier: Diagnosis of  By: Radene Ou MD, [place]    ? ? ?Past Surgical History:  ?Procedure Laterality Date  ? BREAST BIOPSY Left 2018  ? CESAREAN SECTION    ? breech  ? ? ?Family History  ?Problem Relation Age of Onset  ? Colon cancer Mother   ? Liver disease Sister   ? Other Neg Hx   ? Breast cancer Neg Hx   ? Esophageal cancer Neg Hx   ? Rectal cancer Neg Hx   ? Tremor Neg Hx   ? ? ?Social History  ? ?Socioeconomic History  ? Marital status: Legally Separated  ?  Spouse name: Not on file  ? Number of children: 1  ? Years of education: Not on file  ? Highest education level: 9th grade  ?Occupational History  ? Occupation: disability  ?Tobacco Use  ? Smoking status: Never  ? Smokeless tobacco: Never  ?Vaping Use  ? Vaping Use: Never used  ?Substance and  Sexual Activity  ? Alcohol use: No  ? Drug use: Not Currently  ?  Types: Cocaine, Marijuana  ?  Comment: remote h/o cocaine 2015 and marijuana 2018  ? Sexual activity: Not Currently  ?Other Topics Concern  ? Not on file  ?Social History Narrative  ? Lives with friend, Rosalee Kaufman and nephew.  One daughter.   ? ?Social Determinants of Health  ? ?Financial Resource Strain: Low Risk   ? Difficulty of Paying Living Expenses: Not very hard  ?Food Insecurity: No Food Insecurity  ? Worried About Charity fundraiser in the Last Year: Never true  ? Ran Out of Food in the Last Year: Never true  ?Transportation Needs: Unmet Transportation Needs  ? Lack of Transportation (Medical): No  ? Lack of Transportation (Non-Medical): Yes  ?Physical Activity:  Inactive  ? Days of Exercise per Week: 0 days  ? Minutes of Exercise per Session: 0 min  ?Stress: No Stress Concern Present  ? Feeling of Stress : Not at all  ?Social Connections: Moderately Isolated  ? Frequency of Communication with Friends and Family: More than three times a week  ? Frequency of Social Gatherings with Friends and Family: More than three times a week  ? Attends Religious Services: More than 4 times per year  ? Active Member of Clubs or Organizations: No  ? Attends Archivist Meetings: Never  ? Marital Status: Separated  ? ? ?Allergies  ?Allergen Reactions  ? Lisinopril Swelling  ? ? ?Outpatient Medications Prior to Visit  ?Medication Sig Dispense Refill  ? albuterol (VENTOLIN HFA) 108 (90 Base) MCG/ACT inhaler Inhale 1 puff into the lungs every 4 (four) hours as needed for wheezing or shortness of breath. 18 g 0  ? ferrous sulfate 325 (65 FE) MG tablet Take 325 mg by mouth daily with breakfast.    ? mometasone-formoterol (DULERA) 100-5 MCG/ACT AERO Inhale 2 puffs into the lungs 2 (two) times daily. 1 each 0  ? torsemide (DEMADEX) 20 MG tablet Take 60 mg (3 tabs) daily alternating with 40 mg (2 tabs) daily 150 tablet 6  ? amLODipine (NORVASC) 2.5 MG tablet Take 1 tablet (2.5 mg total) by mouth daily. 30 tablet 0  ? atorvastatin (LIPITOR) 40 MG tablet Take 1 tablet (40 mg total) by mouth every morning. Must have HF appt for further refills 90 tablet 0  ? carvedilol (COREG) 12.5 MG tablet Take 1 tablet (12.5 mg total) by mouth 2 (two) times daily. 180 tablet 3  ? empagliflozin (JARDIANCE) 10 MG TABS tablet Take 1 tablet (10 mg total) by mouth daily. 90 tablet 0  ? hydrALAZINE (APRESOLINE) 50 MG tablet Take 50 mg by mouth 3 (three) times daily.    ? nystatin ointment (MYCOSTATIN) Apply 1 application. topically 2 (two) times daily as needed (rash/skin irratation).    ? pregabalin (LYRICA) 75 MG capsule Take 75 mg by mouth 2 (two) times daily.    ? Semaglutide,0.25 or 0.'5MG'$ /DOS, (OZEMPIC, 0.25  OR 0.5 MG/DOSE,) 2 MG/1.5ML SOPN Inject 0.25 mg into the skin once a week. 1.5 mL 0  ? ?No facility-administered medications prior to visit.  ? ? ? ?ROS ?Review of Systems  ?Constitutional:  Negative for activity change, appetite change and fatigue.  ?HENT:  Negative for congestion, sinus pressure and sore throat.   ?Eyes:  Negative for visual disturbance.  ?Respiratory:  Negative for cough, chest tightness, shortness of breath and wheezing.   ?Cardiovascular:  Negative for chest pain and palpitations.  ?  Gastrointestinal:  Negative for abdominal distention, abdominal pain and constipation.  ?Endocrine: Negative for polydipsia.  ?Genitourinary:  Negative for dysuria and frequency.  ?Musculoskeletal:  Negative for arthralgias and back pain.  ?Skin:  Negative for rash.  ?Neurological:  Negative for tremors, light-headedness and numbness.  ?Hematological:  Does not bruise/bleed easily.  ?Psychiatric/Behavioral:  Negative for agitation and behavioral problems.   ? ?Objective:  ?BP 140/84   Pulse 87   Resp 16   Wt 213 lb 3.2 oz (96.7 kg)   LMP 04/01/2010   SpO2 95%   BMI 36.60 kg/m?  ? ? ?  11/02/2021  ? 10:14 AM 10/14/2021  ?  3:13 PM 10/13/2021  ?  8:14 AM  ?BP/Weight  ?Systolic BP 989 211 941  ?Diastolic BP 84 70 83  ?Wt. (Lbs) 213.2 211 211.6  ?BMI 36.6 kg/m2 36.22 kg/m2 36.32 kg/m2  ? ? ? ? ?Physical Exam ?Constitutional:   ?   Appearance: She is well-developed. She is obese.  ?Neck:  ?   Comments: No JVD ?Cardiovascular:  ?   Rate and Rhythm: Normal rate.  ?   Heart sounds: Normal heart sounds. No murmur heard. ?Pulmonary:  ?   Effort: Pulmonary effort is normal.  ?   Breath sounds: Normal breath sounds. No wheezing or rales.  ?Chest:  ?   Chest wall: No tenderness.  ?Abdominal:  ?   General: Bowel sounds are normal. There is no distension.  ?   Palpations: Abdomen is soft. There is no mass.  ?   Tenderness: There is no abdominal tenderness.  ?Musculoskeletal:     ?   General: Normal range of motion.  ?   Right  lower leg: No edema.  ?   Left lower leg: No edema.  ?Neurological:  ?   Mental Status: She is alert and oriented to person, place, and time.  ?Psychiatric:     ?   Mood and Affect: Mood normal.  ? ? ?Diabetic Fo

## 2021-11-03 ENCOUNTER — Ambulatory Visit (HOSPITAL_COMMUNITY)
Admission: RE | Admit: 2021-11-03 | Discharge: 2021-11-03 | Disposition: A | Discharge status: Home / Self Care | Payer: Medicare Other | Source: Ambulatory Visit | Attending: Cardiology | Admitting: Cardiology

## 2021-11-03 ENCOUNTER — Encounter (HOSPITAL_COMMUNITY): Payer: Self-pay

## 2021-11-03 VITALS — BP 166/76 | HR 92 | Wt 213.8 lb

## 2021-11-03 DIAGNOSIS — Z6836 Body mass index (BMI) 36.0-36.9, adult: Secondary | ICD-10-CM | POA: Insufficient documentation

## 2021-11-03 DIAGNOSIS — I10 Essential (primary) hypertension: Secondary | ICD-10-CM

## 2021-11-03 DIAGNOSIS — Z79899 Other long term (current) drug therapy: Secondary | ICD-10-CM | POA: Diagnosis not present

## 2021-11-03 DIAGNOSIS — E669 Obesity, unspecified: Secondary | ICD-10-CM | POA: Insufficient documentation

## 2021-11-03 DIAGNOSIS — E785 Hyperlipidemia, unspecified: Secondary | ICD-10-CM | POA: Diagnosis not present

## 2021-11-03 DIAGNOSIS — E1122 Type 2 diabetes mellitus with diabetic chronic kidney disease: Secondary | ICD-10-CM | POA: Diagnosis not present

## 2021-11-03 DIAGNOSIS — G4733 Obstructive sleep apnea (adult) (pediatric): Secondary | ICD-10-CM | POA: Diagnosis not present

## 2021-11-03 DIAGNOSIS — I130 Hypertensive heart and chronic kidney disease with heart failure and stage 1 through stage 4 chronic kidney disease, or unspecified chronic kidney disease: Secondary | ICD-10-CM | POA: Insufficient documentation

## 2021-11-03 DIAGNOSIS — N1832 Chronic kidney disease, stage 3b: Secondary | ICD-10-CM | POA: Insufficient documentation

## 2021-11-03 DIAGNOSIS — Z7984 Long term (current) use of oral hypoglycemic drugs: Secondary | ICD-10-CM | POA: Diagnosis not present

## 2021-11-03 DIAGNOSIS — I5032 Chronic diastolic (congestive) heart failure: Secondary | ICD-10-CM

## 2021-11-03 DIAGNOSIS — I129 Hypertensive chronic kidney disease with stage 1 through stage 4 chronic kidney disease, or unspecified chronic kidney disease: Secondary | ICD-10-CM

## 2021-11-03 LAB — BASIC METABOLIC PANEL
Anion gap: 9 (ref 5–15)
BUN: 56 mg/dL — ABNORMAL HIGH (ref 6–20)
CO2: 24 mmol/L (ref 22–32)
Calcium: 9.6 mg/dL (ref 8.9–10.3)
Chloride: 108 mmol/L (ref 98–111)
Creatinine, Ser: 1.84 mg/dL — ABNORMAL HIGH (ref 0.44–1.00)
GFR, Estimated: 31 mL/min — ABNORMAL LOW (ref >60)
Glucose, Bld: 178 mg/dL — ABNORMAL HIGH (ref 70–99)
Potassium: 4 mmol/L (ref 3.5–5.1)
Sodium: 141 mmol/L (ref 135–145)

## 2021-11-03 LAB — BRAIN NATRIURETIC PEPTIDE: B Natriuretic Peptide: 146.3 pg/mL — ABNORMAL HIGH (ref 0.0–100.0)

## 2021-11-03 MED ORDER — CARVEDILOL 25 MG PO TABS: 25.0000 mg | ORAL_TABLET | Freq: Two times a day (BID) | ORAL | 1 refills | Status: DC

## 2021-11-03 NOTE — Progress Notes (Signed)
ReDS Vest / Clip - 11/03/21 1000   ? ?  ? ReDS Vest / Clip  ? Station Marker B   ? Ruler Value 33.5   ? ReDS Value Range Low volume   ? ReDS Actual Value 35   ? ?  ?  ? ?  ? ? ?

## 2021-11-03 NOTE — Patient Instructions (Signed)
INCREASE Coreg to 25 mg, one tab twice  a day ? ?Labs today ?We will only contact you if something comes back abnormal or we need to make some changes. ?Otherwise no news is good news! ? ?Your physician recommends that you schedule a follow-up appointment in: 4-6 weeks  in the Advanced Practitioners (PA/NP) Clinic  ? ? ?Do the following things EVERYDAY: ?Weigh yourself in the morning before breakfast. Write it down and keep it in a log. ?Take your medicines as prescribed ?Eat low salt foods--Limit salt (sodium) to 2000 mg per day.  ?Stay as active as you can everyday ?Limit all fluids for the day to less than 2 liters ? ?At the [facility], you and your health needs are our priority. As part of our continuing mission to provide you with exceptional heart care, we have created designated Provider Care Teams. These Care Teams include your primary Cardiologist (physician) and Advanced Practice Providers (APPs- Physician Assistants and Nurse Practitioners) who all work together to provide you with the care you need, when you need it.  ? ?You may see any of the following providers on your designated Care Team at your next follow up: ?Dr Glori Bickers ?Dr Loralie Champagne ?Darrick Grinder, NP ?Lyda Jester, PA ?Jessica Milford,NP ?Marlyce Huge, PA ?Audry Riles, PharmD ? ? ?Please be sure to bring in all your medications bottles to every appointment.  ? ?If you have any questions or concerns before your next appointment please send [place] a message through [place] or call our office at [phone].   ? ?TO LEAVE A MESSAGE FOR THE NURSE SELECT OPTION 2, PLEASE LEAVE A MESSAGE INCLUDING: ?YOUR NAME ?DATE OF BIRTH ?CALL BACK NUMBER ?REASON FOR CALL**this is important as we prioritize the call backs ? ?YOU WILL RECEIVE A CALL BACK THE SAME DAY AS LONG AS YOU CALL BEFORE 4:00 PM ? ? ?

## 2021-11-03 NOTE — Progress Notes (Addendum)
?Advanced Heart Failure Consult Note  ? ?Referring Physician: [facility]  ?PCP: [name], MD ?PCP-Cardiologist: Minus Breeding, MD  ? ?HPI:  ?[name] [name] is a 61 year old with a history of HFpEF, DMII, HTN, OSA, and hyperlipidemia.  ?  ?Had sleep study 08/18/21 that demonstrated mild obstructive sleep apnea overall (AHI 13.0/h; RDI 14.6/h); however, sleep apnea was moderately severe during REM sleep (AHI 29.4/h. Severe oxygen desaturation was noted during this study (Min O2 54.0%). Time spent < 89% was 81.7 minutes. ?  ?Admitted 09/15/21 with increased shortness of breath. Diuresed with IV lasix and transitioned to torsemide 40 mg daily. Discharged 09/17/21  ?  ?Admitted 09/22/21 with with A/C HFpEF. Diuresed with IV lasix and transitioned to torsemide 20 mg daily. Discharge weight 233 pounds. She was discharged on torsemide 20 mg daily. Referred to [facility] clinic.  ?  ?Had post hospital f/u on 3/20 and complained of SOB w/ exertion, in the setting of dietary indiscretion w/ sodium. She was volume overloaded and exam w/ elevated ReDs 47%. She was instructed to increase torsemide to 60 mg daily x 2 days then back to torsemide 40 mg daily. CardioMEMs was also discussed.  ?  ?Had return f/u in [facility] clinic for volume assessment 3/29. ReDS clip remained elevated but overall improved down to 38%. Wt down 8 lb. Still SOB w/ activities, NYHA Class II-early III. Torsemide was further increased to alternate between 60 mg daily, 40 mg the next. She was still undecided regarding CardioMEMs.  ? ?She has subsequently been referred to the [facility] for further management and consideration for cardiomems implant and possible referral to paramedicine. Has already been assigned to Dr. Aundra Dubin.  ? ?She presents today for assessment. Here w/ her daughter, who moved from [place] to help her and has been assisting her w/ her meds. Reports doing better. Breathing improved. Less SOB w/ activity. Denies resting dyspnea. No orthopnea/PND. Wt is down to  213 lb. ReDs clip down to 35%. BP moderately elevated. Had pork sausage for breakfast. Swill waiting on CPAP.  ? ? ? ?2D Echo 09/16/21 ? ?Left ventricular ejection fraction, by estimation, is 60 to 65%. The left ventricle has ?normal function. The left ventricle has no regional wall motion abnormalities. Left ?ventricular diastolic parameters are consistent with Grade II diastolic dysfunction ?(pseudonormalization). ?1. ?2. Right ventricular systolic function is normal. The right ventricular size is normal. ?3. Left atrial size was mildly dilated. ?4. The mitral valve is normal in structure. No evidence of mitral valve regurgitation. ?5. The aortic valve is normal in structure. Aortic valve regurgitation is not visualized. ? ?Review of Systems: [y] = yes, '[ ]'$  = no  ? ?General: Weight gain '[ ]'$ ; Weight loss '[ ]'$ ; Anorexia '[ ]'$ ; Fatigue '[ ]'$ ; Fever '[ ]'$ ; Chills '[ ]'$ ; Weakness '[ ]'$   ?Cardiac: Chest pain/pressure '[ ]'$ ; Resting SOB '[ ]'$ ; Exertional SOB '[ ]'$ ; Orthopnea '[ ]'$ ; Pedal Edema '[ ]'$ ; Palpitations '[ ]'$ ; Syncope '[ ]'$ ; Presyncope '[ ]'$ ; Paroxysmal nocturnal dyspnea'[ ]'$   ?Pulmonary: Cough '[ ]'$ ; Wheezing'[ ]'$ ; Hemoptysis'[ ]'$ ; Sputum '[ ]'$ ; Snoring [ Y]  ?GI: Vomiting'[ ]'$ ; Dysphagia'[ ]'$ ; Melena'[ ]'$ ; Hematochezia '[ ]'$ ; Heartburn'[ ]'$ ; Abdominal pain '[ ]'$ ; Constipation '[ ]'$ ; Diarrhea '[ ]'$ ; BRBPR '[ ]'$   ?GU: Hematuria'[ ]'$ ; Dysuria '[ ]'$ ; Nocturia'[ ]'$   ?Vascular: Pain in legs with walking '[ ]'$ ; Pain in feet with lying flat '[ ]'$ ; Non-healing sores '[ ]'$ ; Stroke '[ ]'$ ; TIA '[ ]'$ ; Slurred speech '[ ]'$ ;  ?Neuro:  Headaches'[ ]'$ ; Vertigo'[ ]'$ ; Seizures'[ ]'$ ; Paresthesias'[ ]'$ ;Blurred vision '[ ]'$ ; Diplopia '[ ]'$ ; Vision changes '[ ]'$   ?Ortho/Skin: Arthritis '[ ]'$ ; Joint pain '[ ]'$ ; Muscle pain '[ ]'$ ; Joint swelling '[ ]'$ ; Back Pain '[ ]'$ ; Rash '[ ]'$   ?Psych: Depression'[ ]'$ ; Anxiety'[ ]'$   ?Heme: Bleeding problems '[ ]'$ ; Clotting disorders '[ ]'$ ; Anemia '[ ]'$   ?Endocrine: Diabetes '[ ]'$ ; Thyroid dysfunction'[ ]'$  ? ? ?Past Medical History:  ?Diagnosis Date  ? Anemia   ? CHF (congestive heart failure) ([place])   ? Chronic  hepatitis C without hepatic coma ([place]) 11/09/2016  ? Diabetes mellitus   ? Fibroids   ? 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 Qualifier: Diagnosis of  By: Jorene Minors, [place]    ? Hypertension   ? MRSA (methicillin resistant Staphylococcus aureus)   ? Trichomonas   ? VAGINITIS, BACTERIAL, RECURRENT 08/15/2007  ? Qualifier: Diagnosis of  By: Radene Ou MD, [place]    ? ? ?Current Outpatient Medications  ?Medication Sig Dispense Refill  ? albuterol (VENTOLIN HFA) 108 (90 Base) MCG/ACT inhaler Inhale 1 puff into the lungs every 4 (four) hours as needed for wheezing or shortness of breath. 18 g 0  ? amLODipine (NORVASC) 2.5 MG tablet Take 1 tablet (2.5 mg total) by mouth daily. 90 tablet 1  ? atorvastatin (LIPITOR) 40 MG tablet Take 1 tablet (40 mg total) by mouth every morning. 90 tablet 1  ? carvedilol (COREG) 12.5 MG tablet Take 1 tablet (12.5 mg total) by mouth 2 (two) times daily. 180 tablet 1  ? empagliflozin (JARDIANCE) 10 MG TABS tablet Take 1 tablet (10 mg total) by mouth daily. 90 tablet 1  ? ferrous sulfate 325 (65 FE) MG tablet Take 325 mg by mouth daily with breakfast.    ? hydrALAZINE (APRESOLINE) 50 MG tablet Take 1 tablet (50 mg total) by mouth 3 (three) times daily. 270 tablet 1  ? mometasone-formoterol (DULERA) 100-5 MCG/ACT AERO Inhale 2 puffs into the lungs 2 (two) times daily. 1 each 0  ? pregabalin (LYRICA) 75 MG capsule Take 1 capsule (75 mg total) by mouth 2 (two) times daily. 180 capsule 1  ? Semaglutide,0.25 or 0.'5MG'$ /DOS, (OZEMPIC, 0.25 OR 0.5 MG/DOSE,) 2 MG/1.5ML SOPN Inject 0.5 mg into the skin once a week. 2 mL 6  ? torsemide (DEMADEX) 20 MG tablet Take 60 mg (3 tabs) daily alternating with 40 mg (2 tabs) daily 150 tablet 6  ? ?No current facility-administered medications for this encounter.  ? ? ?Allergies  ?Allergen Reactions  ? Lisinopril Swelling  ? ? ?  ?Social History  ? ?Socioeconomic History  ? Marital status: Legally Separated  ?  Spouse name: Not on file  ? Number of children: 1  ? Years of education: Not  on file  ? Highest education level: 9th grade  ?Occupational History  ? Occupation: disability  ?Tobacco Use  ? Smoking status: Never  ? Smokeless tobacco: Never  ?Vaping Use  ? Vaping Use: Never used  ?Substance and Sexual Activity  ? Alcohol use: No  ? Drug use: Not Currently  ?  Types: Cocaine, Marijuana  ?  Comment: remote h/o cocaine 2015 and marijuana 2018  ? Sexual activity: Not Currently  ?Other Topics Concern  ? Not on file  ?Social History Narrative  ? Lives with friend, Rosalee Kaufman and nephew.  One daughter.   ? ?Social Determinants of Health  ? ?Financial Resource Strain: Low Risk   ? Difficulty of Paying Living Expenses: Not very hard  ?Food Insecurity:  No Food Insecurity  ? Worried About Charity fundraiser in the Last Year: Never true  ? Ran Out of Food in the Last Year: Never true  ?Transportation Needs: Unmet Transportation Needs  ? Lack of Transportation (Medical): No  ? Lack of Transportation (Non-Medical): Yes  ?Physical Activity: Inactive  ? Days of Exercise per Week: 0 days  ? Minutes of Exercise per Session: 0 min  ?Stress: No Stress Concern Present  ? Feeling of Stress : Not at all  ?Social Connections: Moderately Isolated  ? Frequency of Communication with Friends and Family: More than three times a week  ? Frequency of Social Gatherings with Friends and Family: More than three times a week  ? Attends Religious Services: More than 4 times per year  ? Active Member of Clubs or Organizations: No  ? Attends Archivist Meetings: Never  ? Marital Status: Separated  ?Intimate Partner Violence: Not At Risk  ? Fear of Current or Ex-Partner: No  ? Emotionally Abused: No  ? Physically Abused: No  ? Sexually Abused: No  ? ? ?  ?Family History  ?Problem Relation Age of Onset  ? Colon cancer Mother   ? Liver disease Sister   ? Other Neg Hx   ? Breast cancer Neg Hx   ? Esophageal cancer Neg Hx   ? Rectal cancer Neg Hx   ? Tremor Neg Hx   ? ? ?Vitals:  ? 11/03/21 1037  ?BP: (!) 166/76  ?Pulse: 92   ?SpO2: 93%  ?Weight: 97 kg (213 lb 12.8 oz)  ? ? ? ?PHYSICAL EXAM: ?ReDs Clip 35%  ?General:  Well appearing. No respiratory difficulty ?HEENT: normal ?Neck: supple. JVD 6-7 cm. Carotids 2+ bilat; no bruit

## 2021-11-04 ENCOUNTER — Other Ambulatory Visit: Payer: Self-pay | Admitting: Family Medicine

## 2021-11-09 ENCOUNTER — Ambulatory Visit: Payer: Medicare Other | Admitting: Pharmacist

## 2021-11-16 ENCOUNTER — Other Ambulatory Visit: Payer: Self-pay | Admitting: Family Medicine

## 2021-11-16 NOTE — Telephone Encounter (Signed)
Rx is not on current med list. Will froward to provider  ?

## 2021-11-24 ENCOUNTER — Ambulatory Visit: Payer: Medicare Other | Admitting: Family Medicine

## 2021-11-25 ENCOUNTER — Ambulatory Visit: Payer: Medicare Other | Admitting: Family Medicine

## 2021-11-26 ENCOUNTER — Other Ambulatory Visit: Payer: Self-pay | Admitting: Obstetrics and Gynecology

## 2021-11-26 ENCOUNTER — Ambulatory Visit: Payer: Medicare Other

## 2021-11-26 NOTE — Patient Outreach (Signed)
Medicaid Managed Care   Nurse Care Manager Note  11/26/2021 Name:  [name] [name] MRN:  [id number] DOB:  June 29, 1961  [name] [name] is an 61 y.o. year old female who is a primary patient of [name] Register, MD.  The [facility] Managed Care Coordination team was consulted for assistance with:    Chronic healthcare management needs, DM, HTN, GERD, CKD, COPD, OSA, CHF , HLD.  [name] [name] was given information about Medicaid Managed Care Coordination team services today. [name] [name] Patient agreed to services and verbal consent obtained.  Engaged with patient by telephone for follow up visit in response to provider referral for case management and/or care coordination services.   Assessments/Interventions:  Review of past medical history, allergies, medications, health status, including review of consultants reports, laboratory and other test data, was performed as part of comprehensive evaluation and provision of chronic care management services.  SDOH (Social Determinants of Health) assessments and interventions performed: SDOH Interventions    Flowsheet Row Most Recent Value  SDOH Interventions   Food Insecurity Interventions Intervention Not Indicated  Transportation Interventions Intervention Not Indicated     Care Plan  Allergies  Allergen Reactions   Lisinopril Swelling    Medications Reviewed Today     Reviewed by Danie Chandler, RN (Registered Nurse) on 11/26/21 at 1138  Med List Status: <None>   Medication Order Taking? Sig Documenting Provider Last Dose Status Informant  albuterol (VENTOLIN HFA) 108 (90 Base) MCG/ACT inhaler [id number] No Inhale 1 puff into the lungs every 4 (four) hours as needed for wheezing or shortness of breath. [name] Register, MD Taking Active Self, Spouse/Significant Other  amLODipine (NORVASC) 2.5 MG tablet [id number] No Take 1 tablet (2.5 mg total) by mouth daily. [name] Register, MD Taking Active   atorvastatin (LIPITOR) 40 MG tablet  [id number] No Take 1 tablet (40 mg total) by mouth every morning. [name] Register, MD Taking Active   carvedilol (COREG) 25 MG tablet [id number]  Take 1 tablet (25 mg total) by mouth 2 (two) times daily. Robbie Lis M, PA-C  Active   empagliflozin (JARDIANCE) 10 MG TABS tablet [id number] No Take 1 tablet (10 mg total) by mouth daily. [name] Register, MD Taking Active   ferrous sulfate 325 (65 FE) MG tablet [id number] No Take 325 mg by mouth daily with breakfast. [provider] Taking Active   hydrALAZINE (APRESOLINE) 50 MG tablet [id number] No Take 1 tablet (50 mg total) by mouth 3 (three) times daily. [name] Register, MD Taking Active   mometasone-formoterol [facility]) 100-5 MCG/ACT AERO [id number] No Inhale 2 puffs into the lungs 2 (two) times daily. Alford Highland, MD Taking Active Self, Spouse/Significant Other  nystatin ointment (MYCOSTATIN) [id number]  Apply 1 application. topically 2 (two) times daily as needed (rash/skin irritation). [name] Register, MD  Active   pregabalin (LYRICA) 75 MG capsule [id number] No Take 1 capsule (75 mg total) by mouth 2 (two) times daily. [name] Register, MD Taking Active   Semaglutide,0.25 or 0.5MG /DOS, (OZEMPIC, 0.25 OR 0.5 MG/DOSE,) 2 MG/1.5ML SOPN [id number] No Inject 0.5 mg into the skin once a week. [name] Register, MD Taking Active   torsemide (DEMADEX) 20 MG tablet [id number] No Take 60 mg (3 tabs) daily alternating with 40 mg (2 tabs) daily Allayne Butcher, PA-C Taking Active            Patient Active Problem List   Diagnosis Date Noted   Acute on chronic heart failure with preserved ejection fraction (HFpEF) (HCC) 09/22/2021   Heart  failure (HCC) 09/22/2021   CKD (chronic kidney disease) stage 4, GFR 15-29 ml/min (HCC) 07/03/2021   Acute respiratory failure due to COVID-19 (HCC) 05/06/2021   COVID-19 05/05/2021   CHF exacerbation (HCC) 04/02/2021   Chest pain 04/02/2021   Acute-on-chronic kidney injury (HCC) 04/02/2021   COPD  exacerbation (HCC) 12/05/2020   CKD (chronic kidney disease), stage III (HCC) 12/04/2020   Anemia of chronic disease 12/04/2020   Obesity, Class III, BMI 40-49.9 (morbid obesity) (HCC) 07/27/2020   Acute respiratory disease due to COVID-19 virus 07/25/2020   Acute hypoxemic respiratory failure due to COVID-19 (HCC) 07/25/2020   Morbid obesity (HCC) 11/23/2019   Acute respiratory failure with hypoxia (HCC) 11/06/2019   Chronic kidney disease, stage 3b (HCC) 11/06/2019   COPD without exacerbation (HCC)    Acute kidney failure (HCC) 08/18/2019   Left ventricular hypertrophy 07/21/2019   Educated about COVID-19 virus infection 07/21/2019   Acute diastolic HF (heart failure) (HCC)    Hypoxia    OSA (obstructive sleep apnea) 05/09/2019   Hyperglycemia 04/09/2019   Snoring 03/13/2019   Chronic anemia 08/17/2018   Chronic cystitis 08/03/2018   Diabetic neuropathy (HCC) 06/16/2017   Non compliance w medication regimen 04/12/2017   Family history of colon cancer in mother 11/17/2016   Dyspareunia in female 11/11/2016   Screen for colon cancer 11/11/2016   History of ovarian cyst 11/11/2016   Chronic hepatitis C without hepatic coma (HCC) 11/09/2016   Hyperlipidemia 10/07/2016   Type 2 diabetes mellitus with hyperlipidemia (HCC) 01/29/2014   Status post intraocular lens implant 11/08/2013   PCO (posterior capsular opacification) 09/28/2013   Pseudophakia 09/12/2013   GERD (gastroesophageal reflux disease) 09/06/2013   Hypertension 09/06/2013   ANEMIA, IRON DEFICIENCY 10/03/2009   LIPOMA 06/18/2009   TINEA PEDIS 05/07/2009   SUBSTANCE ABUSE, MULTIPLE 02/22/2007   HCVD (hypertensive cardiovascular disease) 02/22/2007   Conditions to be addressed/monitored per PCP order:  Chronic healthcare management needs, DM, HTN, GERD, CKD, COPD, OSA, CHF, HLD  Care Plan : General Plan of Care (Adult)  Updates made by Danie Chandler, RN since 11/26/2021 12:00 AM     Problem: Quality of Life  (General Plan of Care)   Priority: High  Onset Date: 10/29/2020     Long-Range Goal: Quality of Life Maintained   Start Date: 10/29/2020  Expected End Date: 02/26/2022  Recent Progress: Not on track  Priority: High  Note:    CARE PLAN ENTRY Medicaid Managed Care (see longitudinal plan of care for additional care plan information)  Current Barriers:  Chronic case management needs related to chronic health conditions. 11/26/21:  Patient given phone number for [facility]-to call and schedule an appointment today.  Patient complaining of nausea every day for several months-does not want to eat-losing weight-patient to contact provider for an appointment.  Recently seen by PCP, CARDS, and Ophthalmology.  Does not check blood pressure and checks blood sugar occasionally.  To follow up about CPAP-has not received yet.   Nurse Case Manager Clinical Goal(s):  Over the next 30 days, patient will verbalize understanding of plan for decreased swelling.  Over the next 30 days, patient will work with provider to address needs  Over the next 30 days, patient will continue to work with Pharmacist. Over the next 30 days, patient will attend all scheduled appointments.  Interventions:  Inter-disciplinary care team collaboration (see longitudinal plan of care) Reviewed medications with patient. Discussed plans with patient for ongoing care management follow up and provided  patient with direct contact information for care management team. Collaboration with Pharmacist for review of medications. Pharmacy referral for medication review. Collaborated with BSW for resources. BSW  Referral for resources-completed  Plan:  Patient will follow up with provider and fill all prescriptions RNCM will follow up with patient within 30 days.   Follow Up:  Patient agrees to Care Plan and Follow-up.  Plan: The Managed Medicaid care management team will reach out to the patient again over the next 30  days. and The  Patient has been provided with contact information for the Managed Medicaid care management team and has been advised to call with any health related questions or concerns.  Date/time of next scheduled RN care management/care coordination outreach: 12/29/21 at 230.

## 2021-11-26 NOTE — Patient Instructions (Signed)
Hi Ms. Seguin, please call and schedule your appointment with [facility] and get your medicine refills-thank you! ? ?Ms. Kahler was given information about Medicaid Managed Care team care coordination services as a part of their [place] Medicaid benefit. VANNA SAILER verbally consented to engagement with the [facility] Managed Care team.  ? ?If you are experiencing a medical emergency, please call 911 or report to your local emergency department or urgent care.  ? ?If you have a non-emergency medical problem during routine business hours, please contact your provider's office and ask to speak with a nurse.  ? ?For questions related to your [facility], please call: [phone] or visit the homepage here: [email or web address] ? ?If you would like to schedule transportation through your [facility], please call the following number at least 2 days in advance of your appointment: [phone]. ? Rides for urgent appointments can also be made after hours by calling Member Services. ? ?Call the [facility] at [phone], at any time, 24 hours a day, 7 days a week. If you are in danger or need immediate medical attention call 911. ? ?If you would like help to quit smoking, call 1-800-QUIT-NOW (506)886-1412) OR Espa?ol: 1-855-D?jelo-Ya 914 168 3300) o para m?s informaci?n haga clic aqu? or Text READY to 200-400 to register via text ? ?Ms. Ventress - following are the goals we discussed in your visit today:  ? Goals Addressed   ? ?  ?  ?  ?  ? This Visit's Progress  ?  Protect My Health     ?  Timeframe:  Long-Range Goal ?Priority:  Medium ?Start Date:          10/29/20                   ?Expected End Date: ongoing            ? ?Follow Up Date: 12/29/21 ?  ?- schedule appointment for flu shot ?- schedule appointment for vaccines needed due to my age or  health ?- schedule recommended health tests (blood work, mammogram, colonoscopy, pap test) ?- schedule and keep appointment for annual check-up  ? ?11/26/21:  Patient to schedule an appointment with [facility].  Seen by Ophthalmology last week and PCP 4/17, CARDS 4/18 ? ?  ?Why is this important?   ?Screening tests can find diseases early when they are easier to treat.  ?Your doctor or nurse will talk with you about which tests are important for you.  ?Getting shots for common diseases like the flu and shingles will help prevent them.    ? ?Patient verbalizes understanding of instructions and care plan provided today and agrees to view in [place]. Active MyChart status confirmed with patient.   ? ?The Managed Medicaid care management team will reach out to the patient again over the next 30 days.  ?The  Patient  has been provided with contact information for the Managed Medicaid care management team and has been advised to call with any health related questions or concerns.  ? ?[name] RN, BSN ?[facility] ?Care Management Coordinator - Managed Medicaid High Risk ?[phone] ?  ?Following is a copy of your plan of care:  ?Care Plan : General Plan of Care (Adult)  ?Updates made by Gayla Medicus, RN since 11/26/2021 12:00 AM  ?  ? ?Problem: Quality of Life (General Plan of Care)   ?Priority: High  ?  Onset Date: 10/29/2020  ?  ? ?Long-Range Goal: Quality of Life Maintained   ?Start Date: 10/29/2020  ?Expected End Date: 02/26/2022  ?Recent Progress: Not on track  ?Priority: High  ?Note:   ? CARE PLAN ENTRY ?Medicaid Managed Care ?(see longitudinal plan of care for additional care plan information) ? ?Current Barriers:  ?Chronic case management needs related to chronic health conditions. ?11/26/21:  Patient given phone number for [facility]-to call and schedule an appointment today.  Patient complaining of nausea every day for several months-does not want to  eat-losing weight-patient to contact provider for an appointment.  Recently seen by PCP, CARDS, and Ophthalmology.  Does not check blood pressure and checks blood sugar occasionally.  To follow up about CPAP-has not received yet.  ? ?Nurse Case Manager Clinical Goal(s):  ?Over the next 30 days, patient will verbalize understanding of plan for decreased swelling.  ?Over the next 30 days, patient will work with provider to address needs  ?Over the next 30 days, patient will continue to work with Pharmacist. ?Over the next 30 days, patient will attend all scheduled appointments. ? ?Interventions:  ?Inter-disciplinary care team collaboration (see longitudinal plan of care) ?Reviewed medications with patient. ?Discussed plans with patient for ongoing care management follow up and provided patient with direct contact information for care management team. ?Collaboration with Pharmacist for review of medications. ?Pharmacy referral for medication review. ?Collaborated with BSW for resources. ?BSW  Referral for resources-completed ? ?Plan:  ?Patient will follow up with provider and fill all prescriptions ?RNCM will follow up with patient within 30 days  ?  ?

## 2021-11-30 NOTE — Progress Notes (Signed)
?Advanced Heart Failure Clinic Note  ? ? ?PCP: [name], MD ?PCP-Cardiologist: Minus Breeding, MD  ?HF Cardiologist: Dr. Aundra Dubin ? ?HPI:  ?[name] [name] is a 61 y.o. with a history of HFpEF, DMII, HTN, OSA, and hyperlipidemia.  ?  ?Had sleep study 08/18/21 that demonstrated mild obstructive sleep apnea overall (AHI 13.0/h; RDI 14.6/h); however, sleep apnea was moderately severe during REM sleep (AHI 29.4/h. Severe oxygen desaturation was noted during this study (Min O2 54.0%). Time spent < 89% was 81.7 minutes. ?  ?Admitted 09/15/21 with a/c CHF. Diuresed with IV lasix and transitioned to torsemide 40 mg daily.   ? ?Admitted 09/22/21 with with A/C HFpEF. Diuresed with IV lasix and transitioned to torsemide 20 mg daily, weight 233 lbs.  Referred to [facility] clinic. Post hospital follow up 3/23, she was volume overloaded in setting of dietary indiscretion, ReDs 47%. Torsemide increased and CarioMEMs discussed. ? ?Had return f/u in [facility] clinic for volume assessment 3/29. ReDS clip 38%. Wt down 8 lb. NYHA Class II-early III. Torsemide increased to alternate between 60 mg daily, 40 mg the next. She was still undecided regarding CardioMEMs.  ? ?Today she returns for HF follow up. Overall feeling fine. Had episode of atypical chest pain driving in her car, resolved spontaneously. She has occasional positional dizziness but no falls. Denies palpitations, abnormal bleeding, dizziness, edema, or PND/Orthopnea. Appetite ok. No fever or chills. Not weighing regularly but has a scale. Misses meds occasionally. Drinks a lot of fluids during the day, and eats fast food frequently. ? ?Labs (4/23): K 4.0, creatinine 1.84 ? ?Cardiac Studies: ?- Echo 3/23: EF 60-65%, Grade II DD, normal RV ? ?Past Medical History:  ?Diagnosis Date  ? Anemia   ? CHF (congestive heart failure) ([place])   ? Chronic hepatitis C without hepatic coma ([place]) 11/09/2016  ? Diabetes mellitus   ? Fibroids   ? 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 Qualifier: Diagnosis of  By: Jorene Minors,  [place]    ? Hypertension   ? MRSA (methicillin resistant Staphylococcus aureus)   ? Trichomonas   ? VAGINITIS, BACTERIAL, RECURRENT 08/15/2007  ? Qualifier: Diagnosis of  By: Radene Ou MD, [place]    ? ?Current Outpatient Medications  ?Medication Sig Dispense Refill  ? albuterol (VENTOLIN HFA) 108 (90 Base) MCG/ACT inhaler Inhale 1 puff into the lungs every 4 (four) hours as needed for wheezing or shortness of breath. 18 g 0  ? amLODipine (NORVASC) 2.5 MG tablet Take 1 tablet (2.5 mg total) by mouth daily. 90 tablet 1  ? atorvastatin (LIPITOR) 40 MG tablet Take 1 tablet (40 mg total) by mouth every morning. 90 tablet 1  ? carvedilol (COREG) 25 MG tablet Take 1 tablet (25 mg total) by mouth 2 (two) times daily. 180 tablet 1  ? empagliflozin (JARDIANCE) 10 MG TABS tablet Take 1 tablet (10 mg total) by mouth daily. 90 tablet 1  ? ferrous sulfate 325 (65 FE) MG tablet Take 325 mg by mouth daily with breakfast.    ? hydrALAZINE (APRESOLINE) 50 MG tablet Take 1 tablet (50 mg total) by mouth 3 (three) times daily. 270 tablet 1  ? mometasone-formoterol (DULERA) 100-5 MCG/ACT AERO Inhale 2 puffs into the lungs 2 (two) times daily. 1 each 0  ? nystatin ointment (MYCOSTATIN) Apply 1 application. topically 2 (two) times daily as needed (rash/skin irritation). 30 g 1  ? pregabalin (LYRICA) 75 MG capsule Take 1 capsule (75 mg total) by mouth 2 (two) times daily. 180 capsule 1  ? Semaglutide,0.25  or 0.'5MG'$ /DOS, (OZEMPIC, 0.25 OR 0.5 MG/DOSE,) 2 MG/1.5ML SOPN Inject 0.5 mg into the skin once a week. 2 mL 6  ? torsemide (DEMADEX) 20 MG tablet Take 60 mg (3 tabs) daily alternating with 40 mg (2 tabs) daily 150 tablet 6  ? ?No current facility-administered medications for this encounter.  ? ?Allergies  ?Allergen Reactions  ? Lisinopril Swelling  ? ?Social History  ? ?Socioeconomic History  ? Marital status: Legally Separated  ?  Spouse name: Not on file  ? Number of children: 1  ? Years of education: Not on file  ? Highest education  level: 9th grade  ?Occupational History  ? Occupation: disability  ?Tobacco Use  ? Smoking status: Never  ? Smokeless tobacco: Never  ?Vaping Use  ? Vaping Use: Never used  ?Substance and Sexual Activity  ? Alcohol use: No  ? Drug use: Not Currently  ?  Types: Cocaine, Marijuana  ?  Comment: remote h/o cocaine 2015 and marijuana 2018  ? Sexual activity: Not Currently  ?Other Topics Concern  ? Not on file  ?Social History Narrative  ? Lives with friend, Rosalee Kaufman and nephew.  One daughter.   ? ?Social Determinants of Health  ? ?Financial Resource Strain: Low Risk   ? Difficulty of Paying Living Expenses: Not very hard  ?Food Insecurity: No Food Insecurity  ? Worried About Charity fundraiser in the Last Year: Never true  ? Ran Out of Food in the Last Year: Never true  ?Transportation Needs: No Transportation Needs  ? Lack of Transportation (Medical): No  ? Lack of Transportation (Non-Medical): No  ?Physical Activity: Inactive  ? Days of Exercise per Week: 0 days  ? Minutes of Exercise per Session: 0 min  ?Stress: No Stress Concern Present  ? Feeling of Stress : Not at all  ?Social Connections: Moderately Isolated  ? Frequency of Communication with Friends and Family: More than three times a week  ? Frequency of Social Gatherings with Friends and Family: More than three times a week  ? Attends Religious Services: More than 4 times per year  ? Active Member of Clubs or Organizations: No  ? Attends Archivist Meetings: Never  ? Marital Status: Separated  ?Intimate Partner Violence: Not At Risk  ? Fear of Current or Ex-Partner: No  ? Emotionally Abused: No  ? Physically Abused: No  ? Sexually Abused: No  ? ?Family History  ?Problem Relation Age of Onset  ? Colon cancer Mother   ? Liver disease Sister   ? Other Neg Hx   ? Breast cancer Neg Hx   ? Esophageal cancer Neg Hx   ? Rectal cancer Neg Hx   ? Tremor Neg Hx   ? ?BP 132/60   Pulse 91   Wt 91.5 kg (201 lb 12.8 oz)   LMP 04/01/2010   SpO2 94%   BMI 34.64  kg/m?  ? ?Wt Readings from Last 3 Encounters:  ?12/01/21 91.5 kg (201 lb 12.8 oz)  ?11/03/21 97 kg (213 lb 12.8 oz)  ?11/02/21 96.7 kg (213 lb 3.2 oz)  ? ?PHYSICAL EXAM: ?General:  NAD. No resp difficulty ?HEENT: Normal ?Neck: Supple. JVP to jaw. Carotids 2+ bilat; no bruits. No lymphadenopathy or thryomegaly appreciated. ?Cor: PMI nondisplaced. Regular rate & rhythm. No rubs, gallops or murmurs. ?Lungs: Clear ?Abdomen: Soft, nontender, nondistended. No hepatosplenomegaly. No bruits or masses. Good bowel sounds. ?Extremities: No cyanosis, clubbing, rash, edema ?Neuro: Alert & oriented x 3, cranial nerves grossly  intact. Moves all 4 extremities w/o difficulty. Affect pleasant. ? ?ASSESSMENT & PLAN: ?1. Chronic HFpEF ?- Echo (3/23): EF 60-65% Grade II DD.  ?- NYHA II. Weight down 12 lbs (recently started on Ozempic), but she is volume overloaded on exam today, ReDs 48%. ?- Increase torsemide to 60 mg bid x 2 days, then 60 mg daily. ?- Start spiro 12.5 mg daily. BMET/BNP today, repeat BMET in 1 week. ?- Continue Jardiance 10 mg daily.  ?- Continue Coreg 25 mg bid.  ?- Continue hydralazine 50 mg tid. ?- Discussed low salt food choices and limiting fluid intake to < 2 liter per day.  ?- Instructed to weigh and record daily. She has a scale at home. ?- Discussed CardioMems again today. She is agreeable. Will follow up on insurance authorization process.  ?  ?2. OSA ?- Per Dr Claiborne Billings.  ?- Had sleep study 07/2022- Mild obstructive sleep apnea overall (AHI 13.0/h; RDI 14.6/h); however, sleep apnea was moderately severe during REM sleep (AHI 29.4/h.Severe oxygen desaturation was noted during this study (Min O2 54.0%). Time spent < 89% was 81.7 minutes. ?- Needs CPAP titration before she can start CPAP.  ?- Weight management.  ?  ?3. Obesity  ?Body mass index is 34.64 kg/m?. ?- She is on Ozempic. ? ?4. CKD Stage IIIb  ?- Followed by Nephrology. ?- Creatinine baseline 1.7-1.9 ?- BMET today. ? ?5. Hypertension  ?- BP better today.     ?- Continue current meds. ? ?6. Medication non-compliance ?- Low health literacy ?- Missing doses of her medications. ?- Discussed paramedicine. She is agreeable. Engage HFSW. ? ?Follow up in 3 weeks with APP an

## 2021-12-01 ENCOUNTER — Ambulatory Visit (HOSPITAL_COMMUNITY)
Admission: RE | Admit: 2021-12-01 | Discharge: 2021-12-01 | Disposition: A | Discharge status: Home / Self Care | Payer: Medicare Other | Source: Ambulatory Visit | Attending: Family Medicine | Admitting: Family Medicine

## 2021-12-01 ENCOUNTER — Telehealth (HOSPITAL_COMMUNITY): Payer: Self-pay | Admitting: Surgery

## 2021-12-01 ENCOUNTER — Encounter (HOSPITAL_COMMUNITY): Payer: Self-pay

## 2021-12-01 VITALS — BP 132/60 | HR 91 | Wt 201.8 lb

## 2021-12-01 DIAGNOSIS — Z6834 Body mass index (BMI) 34.0-34.9, adult: Secondary | ICD-10-CM | POA: Diagnosis not present

## 2021-12-01 DIAGNOSIS — R0789 Other chest pain: Secondary | ICD-10-CM | POA: Diagnosis present

## 2021-12-01 DIAGNOSIS — Z7901 Long term (current) use of anticoagulants: Secondary | ICD-10-CM | POA: Insufficient documentation

## 2021-12-01 DIAGNOSIS — I130 Hypertensive heart and chronic kidney disease with heart failure and stage 1 through stage 4 chronic kidney disease, or unspecified chronic kidney disease: Secondary | ICD-10-CM | POA: Insufficient documentation

## 2021-12-01 DIAGNOSIS — E785 Hyperlipidemia, unspecified: Secondary | ICD-10-CM | POA: Diagnosis not present

## 2021-12-01 DIAGNOSIS — Z91148 Patient's other noncompliance with medication regimen for other reason: Secondary | ICD-10-CM | POA: Diagnosis not present

## 2021-12-01 DIAGNOSIS — E1122 Type 2 diabetes mellitus with diabetic chronic kidney disease: Secondary | ICD-10-CM | POA: Insufficient documentation

## 2021-12-01 DIAGNOSIS — Z7984 Long term (current) use of oral hypoglycemic drugs: Secondary | ICD-10-CM | POA: Insufficient documentation

## 2021-12-01 DIAGNOSIS — R42 Dizziness and giddiness: Secondary | ICD-10-CM | POA: Insufficient documentation

## 2021-12-01 DIAGNOSIS — I5032 Chronic diastolic (congestive) heart failure: Secondary | ICD-10-CM | POA: Insufficient documentation

## 2021-12-01 DIAGNOSIS — N1832 Chronic kidney disease, stage 3b: Secondary | ICD-10-CM | POA: Diagnosis not present

## 2021-12-01 DIAGNOSIS — G4733 Obstructive sleep apnea (adult) (pediatric): Secondary | ICD-10-CM | POA: Diagnosis not present

## 2021-12-01 DIAGNOSIS — Z79899 Other long term (current) drug therapy: Secondary | ICD-10-CM | POA: Insufficient documentation

## 2021-12-01 DIAGNOSIS — E669 Obesity, unspecified: Secondary | ICD-10-CM | POA: Insufficient documentation

## 2021-12-01 DIAGNOSIS — I10 Essential (primary) hypertension: Secondary | ICD-10-CM | POA: Diagnosis not present

## 2021-12-01 DIAGNOSIS — Z556 Problems related to health literacy: Secondary | ICD-10-CM | POA: Diagnosis not present

## 2021-12-01 LAB — BASIC METABOLIC PANEL
Anion gap: 8 (ref 5–15)
BUN: 47 mg/dL — ABNORMAL HIGH (ref 6–20)
CO2: 23 mmol/L (ref 22–32)
Calcium: 9.5 mg/dL (ref 8.9–10.3)
Chloride: 109 mmol/L (ref 98–111)
Creatinine, Ser: 1.61 mg/dL — ABNORMAL HIGH (ref 0.44–1.00)
GFR, Estimated: 36 mL/min — ABNORMAL LOW (ref >60)
Glucose, Bld: 177 mg/dL — ABNORMAL HIGH (ref 70–99)
Potassium: 3.3 mmol/L — ABNORMAL LOW (ref 3.5–5.1)
Sodium: 140 mmol/L (ref 135–145)

## 2021-12-01 MED ORDER — TORSEMIDE 20 MG PO TABS: 60.0000 mg | ORAL_TABLET | Freq: Every day | ORAL | 6 refills | Status: DC

## 2021-12-01 MED ORDER — SPIRONOLACTONE 25 MG PO TABS: 12.5000 mg | ORAL_TABLET | Freq: Every day | ORAL | 3 refills | Status: DC

## 2021-12-01 MED ORDER — SPIRONOLACTONE 25 MG PO TABS: 25.0000 mg | ORAL_TABLET | Freq: Every day | ORAL | 2 refills | Status: DC

## 2021-12-01 NOTE — Progress Notes (Signed)
ReDS Vest / Clip - 12/01/21 1100   ? ?  ? ReDS Vest / Clip  ? Station Marker A   ? Ruler Value 25   ? ReDS Value Range High volume overload   ? ReDS Actual Value 48   ? ?  ?  ? ?  ? ? ?

## 2021-12-01 NOTE — Telephone Encounter (Signed)
-----   Message from Rafael Bihari, [place] sent at 12/01/2021  2:44 PM EDT ----- ?K is low. Take 40 KCL today x 1. We are starting spiro today so that should help keep K up. She has repeat BMET arranged in 1 week ?

## 2021-12-01 NOTE — Progress Notes (Addendum)
Heart and Vascular Care Navigation  12/01/2021  [name] [name] 04/01/1961 [id number]  Reason for Referral: Paramedicine enrollment   Engaged with patient face to face for initial visit for Heart and Vascular Care Coordination.                                                                                                     Paramedicine Initial Assessment:  Housing:  In what kind of housing do you live? House/apt/trailer/shelter? house  Do you rent/pay a mortgage/own? rent  Do you live with anyone? Boyfriend, dtr, godson   Social:  Do you have any children? Yes- daughter  Do you have family or friends who live locally?   Income:  What is your current source of income? $6  Insurance:  Are you currently insured? UHC Medicare and Medicaid  Transportation:  Do you have transportation to your medical appointments?  In the past 12 months has lack of transportation kept you from medical appts or from getting medications?  In the past 12 months has lack of transportation kept you from meetings, work, or getting things you needed?   Daily Health Needs: Do you have a working scale at home? Yes   How do you manage your medications at home? Pharmacy sends them in pill packs  Do you have any concerns with mobility at home? Has a walker she uses when she goes a distance  Do you have a PCP? Dr. Margarita Rana   Do you have any trouble reading or writing? Unable to read well  Are there any additional barriers you see to getting the care you need? Not at this time  CSW will continue to follow through paramedicine program and assist as needed.                                      HRT/VAS Care Coordination     Living arrangements for the past 2 months Single Family Home   Lives with: Self   Home Assistive Devices/Equipment None   DME Agency --  n/a   Current home services --  Please see narrative notes       Social History:                                                                              SDOH Screenings   Alcohol Screen: Low Risk    Last Alcohol Screening Score (AUDIT): 0  Depression (PHQ2-9): Low Risk    PHQ-2 Score: 1  Financial Resource Strain: Low Risk    Difficulty of Paying Living Expenses: Not very hard  Food Insecurity: No Food Insecurity   Worried About Running Out of Food in the Last Year: Never true  Ran Out of Food in the Last Year: Never true  Housing: Low Risk    Last Housing Risk Score: 0  Physical Activity: Inactive   Days of Exercise per Week: 0 days   Minutes of Exercise per Session: 0 min  Social Connections: Moderately Isolated   Frequency of Communication with Friends and Family: More than three times a week   Frequency of Social Gatherings with Friends and Family: More than three times a week   Attends Religious Services: More than 4 times per year   Active Member of [employer] or Organizations: No   Attends Archivist Meetings: Never   Marital Status: Separated  Stress: No Stress Concern Present   Feeling of Stress : Not at all  Tobacco Use: Low Risk    Smoking Tobacco Use: Never   Smokeless Tobacco Use: Never   Passive Exposure: Not on file  Transportation Needs: No Transportation Needs   Lack of Transportation (Medical): No   Lack of Transportation (Non-Medical): No    SDOH Interventions: Financial Resources:    N/a gets SSI  Food Insecurity:   None reported- gets food stamps  Housing Insecurity:   None reported- rents home  Transportation:    None reported- friend drives her     Follow-up plan:    Referral sent to paramedics for review and assignment  Jorge Ny, [facility] Desk#: [phone] Cell#: [phone]

## 2021-12-01 NOTE — Addendum Note (Signed)
Encounter addended by: Jorge Ny, LCSW on: 12/01/2021 2:57 PM ? Actions taken: Clinical Note Signed

## 2021-12-01 NOTE — Telephone Encounter (Signed)
I attempted to reach patient to review results and recommendations per provider.  I left a message for a return call. 

## 2021-12-01 NOTE — Patient Instructions (Addendum)
Thank you for coming in today ? ?Labs were done today, if any labs are abnormal the clinic will call you ?No news is good news ? ?START Spironolactone 12.5 mg 1/2 tablet daily  ? ?INCREASE Torsemide to 60 mg twice daily for 2 days then 60 mg 3 tablets daily  ? ?Your physician recommends that you schedule a follow-up appointment in:  ?3-4 week in clinic  ?2-3 months with Dr. Aundra Dubin ? ?Your physician recommends that you return for lab work in:  ?1 week ? ?You have been referred to paramedicine ? ?At the [facility], you and your health needs are our priority. As part of our continuing mission to provide you with exceptional heart care, we have created designated Provider Care Teams. These Care Teams include your primary Cardiologist (physician) and Advanced Practice Providers (APPs- Physician Assistants and Nurse Practitioners) who all work together to provide you with the care you need, when you need it.  ? ?You may see any of the following providers on your designated Care Team at your next follow up: ?Dr Glori Bickers ?Dr Loralie Champagne ?Darrick Grinder, NP ?Lyda Jester, PA ?Jessica Milford,NP ?Marlyce Huge, PA ?Audry Riles, PharmD ? ? ?Please be sure to bring in all your medications bottles to every appointment.  ? ?If you have any questions or concerns before your next appointment please send [place] a message through [place] or call our office at [phone].   ? ?TO LEAVE A MESSAGE FOR THE NURSE SELECT OPTION 2, PLEASE LEAVE A MESSAGE INCLUDING: ?YOUR NAME ?DATE OF BIRTH ?CALL BACK NUMBER ?REASON FOR CALL**this is important as we prioritize the call backs ? ?YOU WILL RECEIVE A CALL BACK THE SAME DAY AS LONG AS YOU CALL BEFORE 4:00 PM ? ?

## 2021-12-02 ENCOUNTER — Telehealth (HOSPITAL_COMMUNITY): Payer: Self-pay | Admitting: Emergency Medicine

## 2021-12-02 ENCOUNTER — Telehealth (HOSPITAL_COMMUNITY): Payer: Self-pay | Admitting: Surgery

## 2021-12-02 DIAGNOSIS — R768 Other specified abnormal immunological findings in serum: Secondary | ICD-10-CM | POA: Diagnosis not present

## 2021-12-02 DIAGNOSIS — D631 Anemia in chronic kidney disease: Secondary | ICD-10-CM | POA: Diagnosis not present

## 2021-12-02 DIAGNOSIS — N2581 Secondary hyperparathyroidism of renal origin: Secondary | ICD-10-CM | POA: Diagnosis not present

## 2021-12-02 DIAGNOSIS — N1832 Chronic kidney disease, stage 3b: Secondary | ICD-10-CM | POA: Diagnosis not present

## 2021-12-02 DIAGNOSIS — I129 Hypertensive chronic kidney disease with stage 1 through stage 4 chronic kidney disease, or unspecified chronic kidney disease: Secondary | ICD-10-CM | POA: Diagnosis not present

## 2021-12-02 DIAGNOSIS — R809 Proteinuria, unspecified: Secondary | ICD-10-CM | POA: Diagnosis not present

## 2021-12-02 DIAGNOSIS — E1122 Type 2 diabetes mellitus with diabetic chronic kidney disease: Secondary | ICD-10-CM | POA: Diagnosis not present

## 2021-12-02 DIAGNOSIS — I5032 Chronic diastolic (congestive) heart failure: Secondary | ICD-10-CM | POA: Diagnosis not present

## 2021-12-02 MED ORDER — POTASSIUM CHLORIDE CRYS ER 20 MEQ PO TBCR: 40.0000 meq | EXTENDED_RELEASE_TABLET | Freq: Once | ORAL | 0 refills | Status: DC

## 2021-12-02 NOTE — Telephone Encounter (Signed)
[facility] Paramedic called in reference to patient and the call documented yesterday indicating low Potassium level as well as instructions for patient to take a dose of Potassium.  She had spoken with patients family member and that person let her know that a patient had no Potassium at home.  I have sent in prescription for Potassium dose needed to pts pharmacy of choice. ?

## 2021-12-02 NOTE — Telephone Encounter (Addendum)
Spoke to female who advised [name] [name] was still in bed.  Need to call back this afternoon - she has appointment with her kidney doctor. ? ?This note was a duplicate created in error ?

## 2021-12-02 NOTE — Telephone Encounter (Signed)
Spoke with Ms. Tallon regarding medication changes.I let her know her Potassium was low and  [facility] is delivering her Potassium, Spironolactone and Torsemide tomorrow between 11:00  & 3:00.  Advised her to call me when she received prescriptions and let me know if she has any questions. ?

## 2021-12-08 ENCOUNTER — Ambulatory Visit (HOSPITAL_COMMUNITY)
Admission: RE | Admit: 2021-12-08 | Discharge: 2021-12-08 | Disposition: A | Discharge status: Home / Self Care | Payer: Medicare Other | Source: Ambulatory Visit | Attending: Cardiology | Admitting: Cardiology

## 2021-12-08 DIAGNOSIS — I5032 Chronic diastolic (congestive) heart failure: Secondary | ICD-10-CM | POA: Insufficient documentation

## 2021-12-08 LAB — BASIC METABOLIC PANEL
Anion gap: 7 (ref 5–15)
BUN: 42 mg/dL — ABNORMAL HIGH (ref 6–20)
CO2: 27 mmol/L (ref 22–32)
Calcium: 10 mg/dL (ref 8.9–10.3)
Chloride: 107 mmol/L (ref 98–111)
Creatinine, Ser: 1.6 mg/dL — ABNORMAL HIGH (ref 0.44–1.00)
GFR, Estimated: 37 mL/min — ABNORMAL LOW (ref >60)
Glucose, Bld: 134 mg/dL — ABNORMAL HIGH (ref 70–99)
Potassium: 4.4 mmol/L (ref 3.5–5.1)
Sodium: 141 mmol/L (ref 135–145)

## 2021-12-15 ENCOUNTER — Other Ambulatory Visit (HOSPITAL_COMMUNITY): Payer: Self-pay | Admitting: Emergency Medicine

## 2021-12-15 NOTE — Progress Notes (Addendum)
Paramedicine Encounter     Patient ID: [name] [name], female    DOB: Oct 28, 1960, 61 y.o.   MRN: [id number]  Met with [name] [name] today for first home visit.  Pt reports to be feeling well and is without chest pain or SOB.  No edema noted to her lower extremities.  Abdomen soft, not distended.  Reviewed pt medications with her and wrote down all her meds at her request and what each was for.  Discovered pt. Was taking her Spironolactone incorrectly.  She has been takin one 16m tablet daily and should be taking 1/2 tablet daily (12.534m) We did some medication education and she seemed to have a better grasp of things.  Also pt was missing her Amlodopine, Atorvastatin and needed a refill on her Jardiance.  Because she has been taking her Spironolactone incorrectly she only had two tablets left.  Pharmacy agreed to deliver enough SpArlyce Harmanntil her insurance will cover the next refill which will be 12/26/21.  Her fiance helps her with her meds and participated during the visit.  Gave pt a chart to keep track of her weight and discussed the importance of same.  Scheduled next home visit for 12/22/21.  [name] [name] that she is supposed to be taking one Carvedilol twice a day and is only taking 1 pill in the evening because she says it makes her feel nauseous.  [facility] and the error pt was making in taking her Spironolactone.  No med changes noted for pt at this time.   CBG 231 BP 140/70 (BP Location: Left Arm, Patient Position: Sitting, Cuff Size: Normal)   Pulse 93   Resp 16   LMP 04/01/2010   SpO2 95%  Weight yesterday-no scale Last visit weight-201  Patient Care Team: NeCharlott RakesMD as PCP - General (Family Medicine) HoMinus BreedingMD as PCP - Cardiology (Cardiology) Craft, TeLorel MonacoRN as Case Manager  Patient Active Problem List   Diagnosis Date Noted   Acute on chronic heart failure with preserved ejection fraction (HFpEF) ([facility] 09/22/2021   Heart failure (HC[facility]03/01/2022   CKD (chronic kidney disease) stage 4, GFR 15-29 ml/min (HCVan Tassell12/16/2022   Acute respiratory failure due to COVID-19 (HCFox [facility] Grove10/19/2022   COVID-19 05/05/2021   CHF exacerbation (HCS[place]09/15/2022   Chest pain 04/02/2021   Acute-on-chronic kidney injury (HCWestfield09/15/2022   COPD exacerbation (HCHackett[place]05/20/2022   CKD (chronic kidney disease), stage III (HCMid[place]05/19/2022   Anemia of chronic disease 12/04/2020   Obesity, Class III, BMI 40-49.9 (morbid obesity) (HCMount Clemens01/03/2021   Acute respiratory disease due to COVID-19 virus 07/25/2020   Acute hypoxemic respiratory failure due to COVID-19 (HCIliamna01/01/2021   Morbid obesity (HCMiller05/01/2020   Acute respiratory failure with hypoxia (HCColeharbor04/20/2021   Chronic kidney disease, stage 3b (HCTuscola04/20/2021   COPD without exacerbation (HCEast [place]   Acute kidney failure (HCHay[place]01/30/2021   Left ventricular hypertrophy 07/21/2019   Educated about COVID-19 virus infection 0194/76/5465 Acute diastolic HF (heart failure) (HCC)    Hypoxia    OSA (obstructive sleep apnea) 05/09/2019   Hyperglycemia 04/09/2019   Snoring 03/13/2019   Chronic anemia 08/17/2018   Chronic cystitis 08/03/2018   Diabetic neuropathy (HCGilt Edge11/29/2018   Non compliance w medication regimen 04/12/2017   Family history of colon cancer in mother 11/17/2016   Dyspareunia in female 11/11/2016   Screen for colon cancer 11/11/2016   History of  ovarian cyst 11/11/2016   Chronic hepatitis C without hepatic coma ([place]) 11/09/2016   Hyperlipidemia 10/07/2016   Type 2 diabetes mellitus with hyperlipidemia ([place]) 01/29/2014   Status post intraocular lens implant 11/08/2013   PCO (posterior capsular opacification) 09/28/2013   Pseudophakia 09/12/2013   GERD (gastroesophageal reflux disease) 09/06/2013   Hypertension 09/06/2013   ANEMIA, IRON DEFICIENCY 10/03/2009   LIPOMA 06/18/2009   TINEA PEDIS 05/07/2009   SUBSTANCE ABUSE, MULTIPLE 02/22/2007   HCVD  (hypertensive cardiovascular disease) 02/22/2007    Current Outpatient Medications:    albuterol (VENTOLIN HFA) 108 (90 Base) MCG/ACT inhaler, Inhale 1 puff into the lungs every 4 (four) hours as needed for wheezing or shortness of breath., Disp: 18 g, Rfl: 0   empagliflozin (JARDIANCE) 10 MG TABS tablet, Take 1 tablet (10 mg total) by mouth daily., Disp: 90 tablet, Rfl: 1   ferrous sulfate 325 (65 FE) MG tablet, Take 325 mg by mouth daily with breakfast., Disp: , Rfl:    hydrALAZINE (APRESOLINE) 50 MG tablet, Take 1 tablet (50 mg total) by mouth 3 (three) times daily., Disp: 270 tablet, Rfl: 1   mometasone-formoterol (DULERA) 100-5 MCG/ACT AERO, Inhale 2 puffs into the lungs 2 (two) times daily., Disp: 1 each, Rfl: 0   pregabalin (LYRICA) 75 MG capsule, Take 1 capsule (75 mg total) by mouth 2 (two) times daily., Disp: 180 capsule, Rfl: 1   Semaglutide,0.25 or 0.5MG/DOS, (OZEMPIC, 0.25 OR 0.5 MG/DOSE,) 2 MG/1.5ML SOPN, Inject 0.5 mg into the skin once a week., Disp: 2 mL, Rfl: 6   spironolactone (ALDACTONE) 25 MG tablet, Take 0.5 tablets (12.5 mg total) by mouth daily., Disp: 30 tablet, Rfl: 3   torsemide (DEMADEX) 20 MG tablet, Take 3 tablets (60 mg total) by mouth daily., Disp: 90 tablet, Rfl: 6   amLODipine (NORVASC) 2.5 MG tablet, Take 1 tablet (2.5 mg total) by mouth daily. (Patient not taking: Reported on 12/15/2021), Disp: 90 tablet, Rfl: 1   atorvastatin (LIPITOR) 40 MG tablet, Take 1 tablet (40 mg total) by mouth every morning. (Patient not taking: Reported on 12/15/2021), Disp: 90 tablet, Rfl: 1   carvedilol (COREG) 25 MG tablet, Take 1 tablet (25 mg total) by mouth 2 (two) times daily., Disp: 180 tablet, Rfl: 1   nystatin ointment (MYCOSTATIN), Apply 1 application. topically 2 (two) times daily as needed (rash/skin irritation). (Patient not taking: Reported on 12/15/2021), Disp: 30 g, Rfl: 1   potassium chloride SA (KLOR-CON M) 20 MEQ tablet, Take 2 tablets (40 mEq total) by mouth once for 1  dose., Disp: 2 tablet, Rfl: 0 Allergies  Allergen Reactions   Lisinopril Swelling      Social History   Socioeconomic History   Marital status: Legally Separated    Spouse name: Not on file   Number of children: 1   Years of education: Not on file   Highest education level: 9th grade  Occupational History   Occupation: disability  Tobacco Use   Smoking status: Never   Smokeless tobacco: Never  Vaping Use   Vaping Use: Never used  Substance and Sexual Activity   Alcohol use: No   Drug use: Not Currently    Types: Cocaine, Marijuana    Comment: remote h/o cocaine 2015 and marijuana 2018   Sexual activity: Not Currently  Other Topics Concern   Not on file  Social History Narrative   Lives with friend, Rosalee Kaufman and nephew.  One daughter.    Social Determinants of Radio broadcast assistant  Strain: Low Risk    Difficulty of Paying Living Expenses: Not very hard  Food Insecurity: No Food Insecurity   Worried About Charity fundraiser in the Last Year: Never true   Ran Out of Food in the Last Year: Never true  Transportation Needs: No Transportation Needs   Lack of Transportation (Medical): No   Lack of Transportation (Non-Medical): No  Physical Activity: Inactive   Days of Exercise per Week: 0 days   Minutes of Exercise per Session: 0 min  Stress: No Stress Concern Present   Feeling of Stress : Not at all  Social Connections: Moderately Isolated   Frequency of Communication with Friends and Family: More than three times a week   Frequency of Social Gatherings with Friends and Family: More than three times a week   Attends Religious Services: More than 4 times per year   Active Member of Clubs or Organizations: No   Attends Archivist Meetings: Never   Marital Status: Separated  Intimate Partner Violence: Not At Risk   Fear of Current or Ex-Partner: No   Emotionally Abused: No   Physically Abused: No   Sexually Abused: No    Physical  Exam      Future Appointments  Date Time Provider [place]  12/21/2021  1:30 PM Ledora Bottcher, PA [facility]  12/29/2021  2:30 PM [facility] CCC-MM CARE MANAGER 2 [facility]N-[facility] None  12/31/2021 11:00 AM [facility] PA/NP [facility] None  02/10/2022  9:10 AM [name], MD [facility]W-[facility] None  03/16/2022 10:00 AM Larey Dresser, MD [place] None       [name], [facility] Paramedic  12/15/21

## 2021-12-15 NOTE — Addendum Note (Signed)
Encounter addended by: Jorge Ny, LCSW on: 12/15/2021 2:41 PM  Actions taken: Clinical Note Signed

## 2021-12-16 ENCOUNTER — Telehealth: Payer: Self-pay | Admitting: Cardiology

## 2021-12-16 NOTE — Progress Notes (Addendum)
Cardiology Office Note:    Date:  12/21/2021   ID:  [name] [name], DOB August 21, 1960, MRN [id number]  PCP:  [name], MD   [facility] Providers Cardiologist:  Minus Breeding, MD Cardiology APP:  Ledora Bottcher, PA { Referring MD: [name], MD   Chief Complaint  Patient presents with   Follow-up    HFpEF    History of Present Illness:    [name] [name] is a 61 y.o. female with a hx of HTN, OSA, HFpEF, COPD, DM2, HLD, CKD, and OSA. She has been managed for chronic diastolic heart failure.   She was admitted 09/15/2021 with increased shortness of breath.  She was diuresed with IV Lasix for acute on chronic diastolic heart failure and was transitioned to torsemide 40 mg daily prior to her discharge on 09/17/2021.  She was readmitted 09/22/2021 with acute on chronic diastolic heart failure.  She was once again diuresed with IV Lasix but transition to 20 mg of torsemide daily.  Discharge weight was 233 pounds.  She was referred to heart failure TOC clinic.  She had a post hospital follow-up on 10/05/2021 and complained of shortness of breath with exertion in the setting of dietary indiscretion with sodium.  She was volume up on exam with an elevated reds clip of 47%.  She was instructed to increase torsemide to 60 mg daily x2 days then back to torsemide 40 mg daily.  CardioMEMS was also discussed.  She followed up again with [facility] clinic on 10/14/2021 and Reds clip remained elevated but overall improved down to 38%.  Weight was down 8 pounds but she was still short of breath with activity.  Torsemide was further increased to alternate between 60 mg and 40 mg daily.  She is considering CardioMEMS.  She was seen in heart failure clinic 11/03/2021 and was doing better on her adjusted dose of torsemide.  Heart failure clinic was working on para medicine consult.  She was last seen by heart failure clinic 12/01/2021 and was doing well.  She had an episode of atypical chest pain while  driving her car that resolved spontaneously.  She has occasional positional dizziness but no falls.  She presents today for general cardiology follow-up. She at times misses her medications and is not weighing daily.  She had a paramedicine visit and was out of amlodipine and lipitor, also needed a refill on jardiance. She was also taking spiro 25 mg instead of 12.5 mg. She was also only taking coreg at night bc she felt it made her nauesous.   She presents for general cardiology follow up. We spent time clarifying medications. At home she is 190 lbs - down from 258 lbs 6 months ago. Her A1c is much improved. She is taking her medications with the help of her fiance. She appears euvolemic today. Congratulated her on her A1c and weight loss. She complains of chest pain under her left breast that is reproducible on palpation and only occurs when lying down at night to sleep. Her BP is elevated today but she has not taken any of her morning medications.    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   amLODipine (NORVASC) 2.5 MG tablet Take 1 tablet (2.5 mg total) by mouth daily.   atorvastatin (LIPITOR) 40 MG tablet Take 1 tablet (40 mg total) by mouth every morning.   carvedilol (COREG) 25  MG tablet Take 1 tablet (25 mg total) by mouth 2 (two) times daily.   empagliflozin (JARDIANCE) 10 MG TABS tablet Take 1 tablet (10 mg total) by mouth daily.   famotidine (PEPCID) 40 MG tablet Take 1 tablet (40 mg total) by mouth daily.   ferrous sulfate 325 (65 FE) MG tablet Take 325 mg by mouth daily with breakfast.   hydrALAZINE (APRESOLINE) 50 MG tablet Take 1 tablet (50 mg total) by mouth 3 (three) times daily.   mometasone-formoterol (DULERA) 100-5 MCG/ACT AERO Inhale 2 puffs into the lungs 2 (two) times daily.   nystatin ointment (MYCOSTATIN) Apply 1 application. topically 2 (two) times daily as needed (rash/skin irritation).   potassium chloride SA (KLOR-CON M) 20 MEQ tablet Take 2 tablets (40 mEq total) by mouth once for 1 dose.   pregabalin (LYRICA) 75 MG capsule Take 1 capsule (75 mg total) by mouth 2 (two) times daily.   spironolactone (ALDACTONE) 25 MG tablet Take 0.5 tablets (12.5 mg total) by mouth daily.   torsemide (DEMADEX) 20 MG tablet Take 3 tablets (60 mg total) by mouth daily.     Allergies:   Lisinopril   Social History   Socioeconomic History   Marital status: Legally Separated    Spouse name: Not on file   Number of children: 1   Years of education: Not on file   Highest education level: 9th grade  Occupational History   Occupation: disability  Tobacco Use   Smoking status: Never   Smokeless tobacco: Never  Vaping Use   Vaping Use: Never used  Substance and Sexual Activity   Alcohol use: No   Drug use: Not Currently    Types: Cocaine, Marijuana    Comment: remote h/o cocaine 2015 and marijuana 2018   Sexual activity: Not Currently  Other Topics Concern   Not on file  Social History Narrative   Lives with friend, Rosalee Kaufman and nephew.  One daughter.    Social Determinants of Health   Financial Resource Strain: Low Risk    Difficulty of Paying Living Expenses: Not very hard  Food Insecurity: No Food Insecurity   Worried About Charity fundraiser in  the Last Year: Never true   Ran Out of Food in the Last Year: Never true  Transportation Needs: No Transportation Needs   Lack of Transportation (Medical): No   Lack of Transportation (Non-Medical): No  Physical Activity: Inactive   Days of Exercise per Week: 0 days   Minutes of Exercise per Session: 0 min  Stress: No Stress Concern Present   Feeling of Stress : Not at all  Social Connections: Moderately Isolated   Frequency of Communication with Friends and Family: More than three times a week   Frequency of Social Gatherings  with Friends and Family: More than three times a week   Attends Religious Services: More than 4 times per year   Active Member of Clubs or Organizations: No   Attends Archivist Meetings: Never   Marital Status: Separated     Family History: The patient's family history includes Colon cancer in her mother; Liver disease in her sister. There is no history of Other, Breast cancer, Esophageal cancer, Rectal cancer, or Tremor.  ROS:   Please see the history of present illness.     All other systems reviewed and are negative.  EKGs/Labs/Other Studies Reviewed:    The following studies were reviewed today:  2D Echo 09/16/21  1. Left ventricular ejection fraction, by estimation, is 60 to 65%. The left ventricle has normal function. The left ventricle has no regional wall motion abnormalities. Left ventricular diastolic parameters are consistent with Grade II diastolic dysfunction (pseudonormalization). 2. Right ventricular systolic function is normal. The right ventricular size is normal. 3. Left atrial size was mildly dilated. 4. The mitral valve is normal in structure. No evidence of mitral valve regurgitation. 5. The aortic valve is normal in structure. Aortic valve regurgitation is not visualized.  EKG:  EKG is not ordered today.   Recent Labs: 09/24/2021: Magnesium 1.8 09/25/2021: Hemoglobin 8.0; Platelets 168 10/01/2021: ALT 5 11/03/2021: B  Natriuretic Peptide 146.3 12/08/2021: BUN 42; Creatinine, Ser 1.60; Potassium 4.4; Sodium 141  Recent Lipid Panel    Component Value Date/Time   CHOL 147 01/29/2019 1118   TRIG 102 01/29/2019 1118   HDL 59 01/29/2019 1118   CHOLHDL 2.5 01/29/2019 1118   CHOLHDL 3 06/18/2014 0951   VLDL 19.6 06/18/2014 0951   LDLCALC 68 01/29/2019 1118     Risk Assessment/Calculations:           Physical Exam:    VS:  BP (!) 166/86   Pulse 92   Ht '5\' 4"'$  (1.626 m)   Wt 193 lb 12.8 oz (87.9 kg)   LMP 04/01/2010   SpO2 96%   BMI 33.27 kg/m     Wt Readings from Last 3 Encounters:  12/21/21 193 lb 12.8 oz (87.9 kg)  12/01/21 201 lb 12.8 oz (91.5 kg)  11/03/21 213 lb 12.8 oz (97 kg)     GEN:  Well nourished, well developed in no acute distress HEENT: Normal NECK: No JVD; No carotid bruits LYMPHATICS: No lymphadenopathy CARDIAC: RRR, soft murmur, tender to palpation on central and left chest RESPIRATORY:  Clear to auscultation without rales, wheezing or rhonchi  ABDOMEN: Soft, non-tender, non-distended MUSCULOSKELETAL:  No edema; No deformity  SKIN: Warm and dry NEUROLOGIC:  Alert and oriented x 3 PSYCHIATRIC:  Normal affect   ASSESSMENT:    1. Chronic diastolic heart failure ([place])   2. Intercostal pain   3. Primary hypertension   4. CKD (chronic kidney disease) stage 4, GFR 15-29 ml/min (HCC)   5. Hyperlipidemia, unspecified hyperlipidemia type   6. OSA (obstructive sleep apnea)   7. Morbid obesity ([place])   8. Type 2 diabetes mellitus with hyperlipidemia (HCC)    PLAN:    In order of problems listed above:  Chest pain - atypical - does not sound like angina - worse when lying flat, reproducible on palpation - CP relieved with ibuprofen - advised tylenol for CP and use NSAIDs sparingly - also prescribed prilosec for possible GERD   Chronic diastolic heart failure Hypertension CKD  60 mg / 40 mg torsemide alternating every other day 12.5  mg spironolactone, 25 mg  carvedilol twice daily, 2.5 mg amlodipine, 50 mg hydralazine 3 times daily - Potassium supplementation -she was prescribed 40 mEq x 1 dose for hypokalemia.  Spironolactone was started which should help her potassium. She is enrolled in para medicine who is helping with her medications.  - get BMP today - follows with [place] [facility] but unclear when labs were drawn (suspect she had labs on 25 mg spiro) - BP high today because she hasn't taken any of her AM medications yet   Hyperlipidemia Continue 40 mg Lipitor   OSA Not on CPAP   Obesity DM - has been out of ozempic - referred her back to PCP - A1c 5.2%   Follow up in 3 months.   Medication Adjustments/Labs and Tests Ordered: Current medicines are reviewed at length with the patient today.  Concerns regarding medicines are outlined above.  Orders Placed This Encounter  Procedures   Basic metabolic panel   Meds ordered this encounter  Medications   famotidine (PEPCID) 40 MG tablet    Sig: Take 1 tablet (40 mg total) by mouth daily.    Dispense:  30 tablet    Refill:  3    Patient Instructions  Medication Instructions:  No Changes *If you need a refill on your cardiac medications before your next appointment, please call your pharmacy*   Lab Work: BMET Today. If you have labs (blood work) drawn today and your tests are completely normal, you will receive your results only by: [place] (if you have MyChart) OR A paper copy in the mail If you have any lab test that is abnormal or we need to change your treatment, we will call you to review the results.   Testing/Procedures: No Testing   Follow-Up: At [facility], you and your health needs are our priority.  As part of our continuing mission to provide you with exceptional heart care, we have created designated Provider Care Teams.  These Care Teams include your primary Cardiologist (physician) and Advanced Practice Providers (APPs -  Physician  Assistants and Nurse Practitioners) who all work together to provide you with the care you need, when you need it.  We recommend signing up for the patient portal called "MyChart".  Sign up information is provided on this After Visit Summary.  MyChart is used to connect with patients for Virtual Visits (Telemedicine).  Patients are able to view lab/test results, encounter notes, upcoming appointments, etc.  Non-urgent messages can be sent to your provider as well.   To learn more about what you can do with MyChart, go to [email or web address].    Your next appointment:   3 month(s)  The format for your next appointment:   In Person  Provider:   Minus Breeding, MD       Important Information About Sugar         Signed, Ledora Bottcher, [place]  12/21/2021 2:22 PM    [place]

## 2021-12-16 NOTE — Telephone Encounter (Signed)
Benjamine Mola from [place] [facility] called needing clinical information on this patient for her prior-auth.  She needs the clinical information by 10am tomorrow faxed to [phone].

## 2021-12-18 ENCOUNTER — Ambulatory Visit: Payer: Medicare Other | Admitting: General Practice

## 2021-12-18 ENCOUNTER — Ambulatory Visit: Payer: Medicare Other | Admitting: Adult Health

## 2021-12-21 ENCOUNTER — Other Ambulatory Visit: Payer: Self-pay | Admitting: Family Medicine

## 2021-12-21 ENCOUNTER — Ambulatory Visit (INDEPENDENT_AMBULATORY_CARE_PROVIDER_SITE_OTHER): Payer: Medicaid Other | Admitting: Physician Assistant

## 2021-12-21 ENCOUNTER — Encounter: Payer: Self-pay | Admitting: Physician Assistant

## 2021-12-21 VITALS — BP 166/86 | HR 92 | Ht 64.0 in | Wt 193.8 lb

## 2021-12-21 DIAGNOSIS — E1169 Type 2 diabetes mellitus with other specified complication: Secondary | ICD-10-CM | POA: Diagnosis not present

## 2021-12-21 DIAGNOSIS — N184 Chronic kidney disease, stage 4 (severe): Secondary | ICD-10-CM

## 2021-12-21 DIAGNOSIS — G4733 Obstructive sleep apnea (adult) (pediatric): Secondary | ICD-10-CM

## 2021-12-21 DIAGNOSIS — I10 Essential (primary) hypertension: Secondary | ICD-10-CM | POA: Diagnosis not present

## 2021-12-21 DIAGNOSIS — E785 Hyperlipidemia, unspecified: Secondary | ICD-10-CM

## 2021-12-21 DIAGNOSIS — R0782 Intercostal pain: Secondary | ICD-10-CM

## 2021-12-21 DIAGNOSIS — I5032 Chronic diastolic (congestive) heart failure: Secondary | ICD-10-CM | POA: Diagnosis not present

## 2021-12-21 DIAGNOSIS — N1832 Chronic kidney disease, stage 3b: Secondary | ICD-10-CM

## 2021-12-21 MED ORDER — FAMOTIDINE 40 MG PO TABS: 40.0000 mg | ORAL_TABLET | Freq: Every day | ORAL | 3 refills | Status: DC

## 2021-12-21 NOTE — Telephone Encounter (Signed)
Pt is calling back, requesting an update. I advised pt of the 48-72 hour turn-around time.     Pt is requesting a call back.

## 2021-12-21 NOTE — Telephone Encounter (Signed)
The patient called to request a refill of their insulin   The patient was uncertain of the medication's name   The patient shares the medication is insulin that they've been directed to inject 1x weekly   There were no medications matching that direction in the patient's active medication list   Please contact further when possible

## 2021-12-21 NOTE — Patient Instructions (Signed)
Medication Instructions:  No Changes *If you need a refill on your cardiac medications before your next appointment, please call your pharmacy*   Lab Work: BMET Today. If you have labs (blood work) drawn today and your tests are completely normal, you will receive your results only by: [place] (if you have MyChart) OR A paper copy in the mail If you have any lab test that is abnormal or we need to change your treatment, we will call you to review the results.   Testing/Procedures: No Testing   Follow-Up: At [facility], you and your health needs are our priority.  As part of our continuing mission to provide you with exceptional heart care, we have created designated Provider Care Teams.  These Care Teams include your primary Cardiologist (physician) and Advanced Practice Providers (APPs -  Physician Assistants and Nurse Practitioners) who all work together to provide you with the care you need, when you need it.  We recommend signing up for the patient portal called "MyChart".  Sign up information is provided on this After Visit Summary.  MyChart is used to connect with patients for Virtual Visits (Telemedicine).  Patients are able to view lab/test results, encounter notes, upcoming appointments, etc.  Non-urgent messages can be sent to your provider as well.   To learn more about what you can do with MyChart, go to [email or web address].    Your next appointment:   3 month(s)  The format for your next appointment:   In Person  Provider:   Minus Breeding, MD       Important Information About Sugar

## 2021-12-22 ENCOUNTER — Encounter (HOSPITAL_COMMUNITY): Payer: Self-pay

## 2021-12-22 ENCOUNTER — Other Ambulatory Visit (HOSPITAL_COMMUNITY): Payer: Self-pay | Admitting: Emergency Medicine

## 2021-12-22 ENCOUNTER — Telehealth: Payer: Self-pay | Admitting: Family Medicine

## 2021-12-22 ENCOUNTER — Other Ambulatory Visit: Payer: Self-pay

## 2021-12-22 LAB — BASIC METABOLIC PANEL
BUN/Creatinine Ratio: 40 — ABNORMAL HIGH (ref 12–28)
BUN: 71 mg/dL — ABNORMAL HIGH (ref 8–27)
CO2: 19 mmol/L — ABNORMAL LOW (ref 20–29)
Calcium: 10.3 mg/dL (ref 8.7–10.3)
Chloride: 104 mmol/L (ref 96–106)
Creatinine, Ser: 1.78 mg/dL — ABNORMAL HIGH (ref 0.57–1.00)
Glucose: 181 mg/dL — ABNORMAL HIGH (ref 70–99)
Potassium: 4.2 mmol/L (ref 3.5–5.2)
Sodium: 142 mmol/L (ref 134–144)
eGFR: 32 mL/min/{1.73_m2} — ABNORMAL LOW (ref >59)

## 2021-12-22 NOTE — Telephone Encounter (Signed)
Copied from [place] [id number]. Topic: General - Other >> Dec 21, 2021  2:13 PM Tessa Lerner A wrote: Reason for CRM: The patient would like to speak with their PCP directly about two concerns   The patient was directed by staff at their pharmacy to contact their PCP and address additional concerns related to their insulin   The patient would also like to follow up on a previous discussion about a breast reduction   Please contact further when possible

## 2021-12-22 NOTE — Telephone Encounter (Signed)
rx was sent to pharmacy on 11/02/21 #52m/6 RF Requested Prescriptions  Pending Prescriptions Disp Refills  . Semaglutide,0.25 or 0.'5MG'$ /DOS, (OZEMPIC, 0.25 OR 0.5 MG/DOSE,) 2 MG/1.5ML SOPN 2 mL 6    Sig: Inject 0.5 mg into the skin once a week.     Endocrinology:  Diabetes - GLP-1 Receptor Agonists - semaglutide Failed - 12/21/2021  4:31 PM      Failed - Cr in normal range and within 360 days    Creat  Date Value Ref Range Status  12/12/2012 0.88 0.50 - 1.10 mg/dL Final   Creatinine, Ser  Date Value Ref Range Status  12/21/2021 1.78 (H) 0.57 - 1.00 mg/dL Final   Creatinine,U  Date Value Ref Range Status  06/13/2007 45.3 mg/dL Final    Comment:    See lab report for associated comment(s)   Creatinine, Urine  Date Value Ref Range Status  09/15/2021 47 mg/dL Final    Comment:    [facility] [address] [place] [id number]        Passed - HBA1C in normal range and within 180 days    HbA1c, POC (controlled diabetic range)  Date Value Ref Range Status  02/18/2021 6.6 0.0 - 7.0 % Final   Hgb A1c MFr Bld  Date Value Ref Range Status  09/23/2021 5.2 4.8 - 5.6 % Final    Comment:    (NOTE) Pre diabetes:          5.7%-6.4%  Diabetes:              >6.4%  Glycemic control for   <7.0% adults with diabetes          Passed - Valid encounter within last 6 months    Recent Outpatient Visits          1 month ago Type 2 diabetes mellitus with other neurologic complication, with long-term current use of insulin ([place]   [place], MD   2 months ago Hospital discharge follow-up   [place] FNP   3 months ago Acute on chronic congestive heart failure, unspecified heart failure type ([facility]   [facility] TK[facility]e Basque NP   3 months ago Type 2 diabetes mellitus with other neurologic complication, with long-term current use of insulin  ([place]   [place] E[place]ane Ferretti MD   8 months ago [facility] respiratory failure with hypoxia ([facility]   [place] MD      Future Appointments            In 1 month NCharlott Rakes MD [place]  In 3 months [place] JJeneen Rinks MD [place] [facility]

## 2021-12-22 NOTE — Progress Notes (Signed)
Paramedicine Encounter    Patient ID: [name] [name], female    DOB: [birth year], 61 y.o.   MRN: [id number]  Cbg 146 at last night 11:00 snack  [name] [name] appears to be doing well this morning.  Her fiance had to get her out of the bed when I arrived.  Reviewed pt's medications and she has missed several of her doses.  I reconciled her medications in a  new pill box and we discussed at length the appropriate way to take them.  She was prescribed Famotodine by her PCP yesterday and it will be delivered by [facility] today.  Her Spironolactone will be delivered on Monday.  Also, she is out of her Semaglutide prescription which is due today.  I reached out to Dr. Margarita Rana w/ message that this script needs prior authorization before it can be refilled.  Next visit scheduled for 6/13 '@11'$ :00  BP 140/78 (BP Location: Right Arm, Patient Position: Sitting, Cuff Size: Normal)   Pulse 83   Wt 190 lb (86.2 kg)   LMP 04/01/2010   SpO2 97%   BMI 32.61 kg/m  Weight yesterday-193lb Last visit weight-not taken  Patient Care Team: [name], MD as PCP - General (Family Medicine) Minus Breeding, MD as PCP - Cardiology (Cardiology) Craft, Lorel Monaco, RN as Case Manager [place], Tami Lin, PA as Physician Assistant (Cardiology)  Patient Active Problem List   Diagnosis Date Noted   Acute on chronic heart failure with preserved ejection fraction (HFpEF) ([place]) 09/22/2021   Heart failure ([place]) 09/22/2021   CKD (chronic kidney disease) stage 4, GFR 15-29 ml/min ([place]) 07/03/2021   Acute respiratory failure due to COVID-19 ([place]) 05/06/2021   COVID-19 05/05/2021   CHF exacerbation ([place]) 04/02/2021   Chest pain 04/02/2021   Acute-on-chronic kidney injury ([place]) 04/02/2021   COPD exacerbation ([place]) 12/05/2020   CKD (chronic kidney disease), stage III ([place]) 12/04/2020   Anemia of chronic disease 12/04/2020   Obesity, Class III, BMI 40-49.9 (morbid obesity) ([place]) 07/27/2020   Acute respiratory  disease due to COVID-19 virus 07/25/2020   Acute hypoxemic respiratory failure due to COVID-19 ([place]) 07/25/2020   Morbid obesity ([place]) 11/23/2019   Acute respiratory failure with hypoxia ([place]) 11/06/2019   Chronic kidney disease, stage 3b ([place]) 11/06/2019   COPD without exacerbation ([place])    Acute kidney failure ([place]) 08/18/2019   Left ventricular hypertrophy 07/21/2019   Educated about COVID-19 virus infection 10/93/2355   Acute diastolic HF (heart failure) (HCC)    Hypoxia    OSA (obstructive sleep apnea) 05/09/2019   Hyperglycemia 04/09/2019   Snoring 03/13/2019   Chronic anemia 08/17/2018   Chronic cystitis 08/03/2018   Diabetic neuropathy ([place]) 06/16/2017   Non compliance w medication regimen 04/12/2017   Family history of colon cancer in mother 11/17/2016   Dyspareunia in female 11/11/2016   Screen for colon cancer 11/11/2016   History of ovarian cyst 11/11/2016   Chronic hepatitis C without hepatic coma ([place]) 11/09/2016   Hyperlipidemia 10/07/2016   Type 2 diabetes mellitus with hyperlipidemia ([place]) 01/29/2014   Status post intraocular lens implant 11/08/2013   PCO (posterior capsular opacification) 09/28/2013   Pseudophakia 09/12/2013   GERD (gastroesophageal reflux disease) 09/06/2013   Hypertension 09/06/2013   ANEMIA, IRON DEFICIENCY 10/03/2009   LIPOMA 06/18/2009   TINEA PEDIS 05/07/2009   SUBSTANCE ABUSE, MULTIPLE 02/22/2007   HCVD (hypertensive cardiovascular disease) 02/22/2007    Current Outpatient Medications:    albuterol (VENTOLIN HFA) 108 (90 Base) MCG/ACT inhaler, Inhale 1  puff into the lungs every 4 (four) hours as needed for wheezing or shortness of breath., Disp: 18 g, Rfl: 0   amLODipine (NORVASC) 2.5 MG tablet, Take 1 tablet (2.5 mg total) by mouth daily., Disp: 90 tablet, Rfl: 1   atorvastatin (LIPITOR) 40 MG tablet, Take 1 tablet (40 mg total) by mouth every morning., Disp: 90 tablet, Rfl: 1   carvedilol (COREG) 25 MG tablet, Take 1 tablet (25 mg  total) by mouth 2 (two) times daily., Disp: 180 tablet, Rfl: 1   empagliflozin (JARDIANCE) 10 MG TABS tablet, Take 1 tablet (10 mg total) by mouth daily., Disp: 90 tablet, Rfl: 1   famotidine (PEPCID) 40 MG tablet, Take 1 tablet (40 mg total) by mouth daily., Disp: 30 tablet, Rfl: 3   ferrous sulfate 325 (65 FE) MG tablet, Take 325 mg by mouth daily with breakfast., Disp: , Rfl:    hydrALAZINE (APRESOLINE) 50 MG tablet, Take 1 tablet (50 mg total) by mouth 3 (three) times daily., Disp: 270 tablet, Rfl: 1   mometasone-formoterol (DULERA) 100-5 MCG/ACT AERO, Inhale 2 puffs into the lungs 2 (two) times daily., Disp: 1 each, Rfl: 0   nystatin ointment (MYCOSTATIN), Apply 1 application. topically 2 (two) times daily as needed (rash/skin irritation)., Disp: 30 g, Rfl: 1   potassium chloride SA (KLOR-CON M) 20 MEQ tablet, Take 2 tablets (40 mEq total) by mouth once for 1 dose., Disp: 2 tablet, Rfl: 0   pregabalin (LYRICA) 75 MG capsule, Take 1 capsule (75 mg total) by mouth 2 (two) times daily., Disp: 180 capsule, Rfl: 1   Semaglutide,0.25 or 0.'5MG'$ /DOS, (OZEMPIC, 0.25 OR 0.5 MG/DOSE,) 2 MG/1.5ML SOPN, Inject 0.5 mg into the skin once a week. (Patient not taking: Reported on 12/21/2021), Disp: 2 mL, Rfl: 6   spironolactone (ALDACTONE) 25 MG tablet, Take 0.5 tablets (12.5 mg total) by mouth daily., Disp: 30 tablet, Rfl: 3   torsemide (DEMADEX) 20 MG tablet, Take 3 tablets (60 mg total) by mouth daily., Disp: 90 tablet, Rfl: 6 Allergies  Allergen Reactions   Lisinopril Swelling      Social History   Socioeconomic History   Marital status: Legally Separated    Spouse name: Not on file   Number of children: 1   Years of education: Not on file   Highest education level: 9th grade  Occupational History   Occupation: disability  Tobacco Use   Smoking status: Never   Smokeless tobacco: Never  Vaping Use   Vaping Use: Never used  Substance and Sexual Activity   Alcohol use: No   Drug use: Not  Currently    Types: Cocaine, Marijuana    Comment: remote h/o cocaine 2015 and marijuana 2018   Sexual activity: Not Currently  Other Topics Concern   Not on file  Social History Narrative   Lives with friend, Rosalee Kaufman and nephew.  One daughter.    Social Determinants of Health   Financial Resource Strain: Low Risk    Difficulty of Paying Living Expenses: Not very hard  Food Insecurity: No Food Insecurity   Worried About Charity fundraiser in the Last Year: Never true   Ran Out of Food in the Last Year: Never true  Transportation Needs: No Transportation Needs   Lack of Transportation (Medical): No   Lack of Transportation (Non-Medical): No  Physical Activity: Inactive   Days of Exercise per Week: 0 days   Minutes of Exercise per Session: 0 min  Stress: No Stress Concern Present  Feeling of Stress : Not at all  Social Connections: Moderately Isolated   Frequency of Communication with Friends and Family: More than three times a week   Frequency of Social Gatherings with Friends and Family: More than three times a week   Attends Religious Services: More than 4 times per year   Active Member of Clubs or Organizations: No   Attends Archivist Meetings: Never   Marital Status: Separated  Intimate Partner Violence: Not At Risk   Fear of Current or Ex-Partner: No   Emotionally Abused: No   Physically Abused: No   Sexually Abused: No    Physical Exam      Future Appointments  Date Time Provider [place]  12/29/2021  2:30 PM [facility] CCC-MM CARE MANAGER 2 THN-CCC None  12/31/2021 11:00 AM MC-HVSC PA/NP MC-HVSC None  02/10/2022  9:10 AM [name], MD CHW-CHWW None  03/16/2022 10:00 AM Larey Dresser, MD MC-HVSC None  03/23/2022  1:40 PM Minus Breeding, MD CVD-NORTHLIN [place], [place] [facility] Paramedic  12/22/21

## 2021-12-22 NOTE — Telephone Encounter (Signed)
Called [facility] and advised pt was calling PCP about refill. Advised that pt will receive a delivery today after 3 pm.  Pt verbalized understanding.   Pt requested a sooner appt with PCP- placed pt on wait list. Current appt is in July- pt is going to have a breast reduction and needs medical clearance and wants to get seen before July

## 2021-12-23 ENCOUNTER — Telehealth: Payer: Self-pay

## 2021-12-23 NOTE — Telephone Encounter (Signed)
Ozempic PA approved until 12/23/2022

## 2021-12-23 NOTE — Telephone Encounter (Signed)
Created in error

## 2021-12-23 NOTE — Telephone Encounter (Signed)
PA has been approved

## 2021-12-29 ENCOUNTER — Other Ambulatory Visit (HOSPITAL_COMMUNITY): Payer: Self-pay | Admitting: Emergency Medicine

## 2021-12-29 ENCOUNTER — Other Ambulatory Visit: Payer: Self-pay | Admitting: Obstetrics and Gynecology

## 2021-12-29 ENCOUNTER — Other Ambulatory Visit (HOSPITAL_COMMUNITY): Payer: Self-pay | Admitting: *Deleted

## 2021-12-29 ENCOUNTER — Other Ambulatory Visit (HOSPITAL_COMMUNITY): Payer: Self-pay | Admitting: Family Medicine

## 2021-12-29 NOTE — Patient Instructions (Signed)
Hi [name] [name], glad you are doing well today-have a great afternoon!  [name] [name] was given information about Medicaid Managed Care team care coordination services as a part of their [place] Medicaid benefit. [name] [name] verbally consented to engagement with the [facility] Managed Care team.   If you are experiencing a medical emergency, please call 911 or report to your local emergency department or urgent care.   If you have a non-emergency medical problem during routine business hours, please contact your provider's office and ask to speak with a nurse.   For questions related to your [facility], please call: [phone] or visit the homepage here: [email or web address]  If you would like to schedule transportation through your [facility], please call the following number at least 2 days in advance of your appointment: [phone].  Rides for urgent appointments can also be made after hours by calling Member Services.  Call the [place] at [phone], at any time, 24 hours a day, 7 days a week. If you are in danger or need immediate medical attention call 911.  If you would like help to quit smoking, call 1-800-QUIT-NOW 336-819-6082) OR Espaol: 1-855-Djelo-Ya (5-176-160-7371) o para ms informacin haga clic aqu or Text READY to 200-400 to register via text  [name] [name] - following are the goals we discussed in your visit today:   Goals Addressed             This Visit's Progress    Protect My Health       Timeframe:  Long-Range Goal Priority:  Medium Start Date:          10/29/20                   Expected End Date: ongoing             Follow Up Date: 02/04/22   - schedule appointment for flu shot - schedule appointment for vaccines needed due to my age or health - schedule recommended health tests (blood work,  mammogram, colonoscopy, pap test) - schedule and keep appointment for annual check-up   12/29/21:  Patient being followed by Paramedicine weekly.  Why is this important?   Screening tests can find diseases early when they are easier to treat.  Your doctor or nurse will talk with you about which tests are important for you.  Getting shots for common diseases like the flu and shingles will help prevent them.    Patient verbalizes understanding of instructions and care plan provided today and agrees to view in [place]. Active MyChart status and patient understanding of how to access instructions and care plan via MyChart confirmed with patient.     The Managed Medicaid care management team will reach out to the patient again over the next 30 business  days.  The  Patient  has been provided with contact information for the Managed Medicaid care management team and has been advised to call with any health related questions or concerns.   [name] RN, BSN [place] Management Coordinator - Managed Medicaid High Risk [phone]   Following is a copy of your plan of care:  Care Plan : General Plan of Care (Adult)  Updates made by Gayla Medicus, RN since 12/29/2021 12:00 AM     Problem: Quality of Life (General Plan of Care)   Priority: High  Onset Date: 10/29/2020  Long-Range Goal: Quality of Life Maintained   Start Date: 10/29/2020  Expected End Date: 02/26/2022  Recent Progress: Not on track  Priority: High  Note:    CARE PLAN ENTRY Medicaid Managed Care (see longitudinal plan of care for additional care plan information)  Current Barriers:  Chronic case management needs related to chronic health conditions. 12/29/21:  patient without complaint today-being followed by paramedicine weekly.    Nurse Case Manager Clinical Goal(s):  Over the next 30 days, patient will verbalize understanding of plan for decreased swelling.  Over the next 30  days, patient will work with provider to address needs  Over the next 30 days, patient will continue to work with Pharmacist. Over the next 30 days, patient will attend all scheduled appointments.  Interventions:  Inter-disciplinary care team collaboration (see longitudinal plan of care) Reviewed medications with patient. Discussed plans with patient for ongoing care management follow up and provided patient with direct contact information for care management team. Collaboration with Pharmacist for review of medications. Pharmacy referral for medication review. Collaborated with BSW for resources. BSW  Referral for resources-completed  Plan:  Patient will follow up with provider and fill all prescriptions RNCM will follow up with patient within 30 days

## 2021-12-29 NOTE — Progress Notes (Signed)
Paramedicine Encounter    Patient ID: [name] [name], female    DOB: 03-05-1961, 61 y.o.   MRN: [id number]  Cbg 189 BP 138/70 (BP Location: Left Arm, Patient Position: Sitting, Cuff Size: Normal)   Pulse 88   Wt 192 lb 6.4 oz (87.3 kg)   LMP 04/01/2010   SpO2 97%   BMI 33.03 kg/m   Weight yesterday-not taken Last visit weight-190 lbs  Home visit with [name] [name] today.  She has no complaints of SOB or chest pain.  No edema noted.  Lung sounds clear and equal bilat.  She did a great job taking her medications from her pill box.  She did not miss any doses.  She had a letter from her kidney doctor advising her that her BUN and Creatinine was elevated and wanted her to take 1000 IU Vit. D.  I spoke to her daughter who as at the home and she stated that she would pick some up for her.  Also, pt's sugar was 189 at my visit and she had not eaten since last night.  She stated she had a  barbeque sandwich and a regular soft drink.  I discussed with her nutrition and how to make better choices.  She needs to stay away from soft drinks and try and either drink water or diet soda's.  I spoke with her about uncontrolled diabetes and the effects it can have on her kidneys and the fact that she already has CKD.  She stated she understood.  I will do a home visit 6/20 @ 11:00.  Also, called [facility] for refills on her Torsemide.     Patient Care Team: [name] Rakes, MD as PCP - General (Family Medicine) [name] Breeding, MD as PCP - Cardiology (Cardiology) Craft, Lorel Monaco, RN as Case Manager [name] [name], [place] as Physician Assistant (Cardiology)  Patient Active Problem List   Diagnosis Date Noted   Acute on chronic heart failure with preserved ejection fraction (HFpEF) ([place]) 09/22/2021   Heart failure ([place]) 09/22/2021   CKD (chronic kidney disease) stage 4, GFR 15-29 ml/min ([place]) 07/03/2021   Acute respiratory failure due to COVID-19 ([place]) 05/06/2021   COVID-19 05/05/2021   CHF  exacerbation ([place]) 04/02/2021   Chest pain 04/02/2021   Acute-on-chronic kidney injury ([place]) 04/02/2021   COPD exacerbation ([place]) 12/05/2020   CKD (chronic kidney disease), stage III ([name]) 12/04/2020   Anemia of chronic disease 12/04/2020   Obesity, Class III, BMI 40-49.9 (morbid obesity) ([place]) 07/27/2020   Acute respiratory disease due to COVID-19 virus 07/25/2020   Acute hypoxemic respiratory failure due to COVID-19 ([place]) 07/25/2020   Morbid obesity ([place]) 11/23/2019   Acute respiratory failure with hypoxia ([place]) 11/06/2019   Chronic kidney disease, stage 3b ([place]) 11/06/2019   COPD without exacerbation ([place])    Acute kidney failure ([place]) 08/18/2019   Left ventricular hypertrophy 07/21/2019   Educated about COVID-19 virus infection 42/59/5638   Acute diastolic HF (heart failure) (HCC)    Hypoxia    OSA (obstructive sleep apnea) 05/09/2019   Hyperglycemia 04/09/2019   Snoring 03/13/2019   Chronic anemia 08/17/2018   Chronic cystitis 08/03/2018   Diabetic neuropathy ([place]) 06/16/2017   Non compliance w medication regimen 04/12/2017   Family history of colon cancer in mother 11/17/2016   Dyspareunia in female 11/11/2016   Screen for colon cancer 11/11/2016   History of ovarian cyst 11/11/2016   Chronic hepatitis C without hepatic coma ([place]) 11/09/2016   Hyperlipidemia 10/07/2016  Type 2 diabetes mellitus with hyperlipidemia ([place]) 01/29/2014   Status post intraocular lens implant 11/08/2013   PCO (posterior capsular opacification) 09/28/2013   Pseudophakia 09/12/2013   GERD (gastroesophageal reflux disease) 09/06/2013   Hypertension 09/06/2013   ANEMIA, IRON DEFICIENCY 10/03/2009   LIPOMA 06/18/2009   TINEA PEDIS 05/07/2009   SUBSTANCE ABUSE, MULTIPLE 02/22/2007   HCVD (hypertensive cardiovascular disease) 02/22/2007    Current Outpatient Medications:    albuterol (VENTOLIN HFA) 108 (90 Base) MCG/ACT inhaler, Inhale 1 puff into the lungs every 4 (four) hours as needed for  wheezing or shortness of breath. (Patient not taking: Reported on 12/22/2021), Disp: 18 g, Rfl: 0   amLODipine (NORVASC) 2.5 MG tablet, Take 1 tablet (2.5 mg total) by mouth daily., Disp: 90 tablet, Rfl: 1   atorvastatin (LIPITOR) 40 MG tablet, Take 1 tablet (40 mg total) by mouth every morning., Disp: 90 tablet, Rfl: 1   carvedilol (COREG) 25 MG tablet, Take 1 tablet (25 mg total) by mouth 2 (two) times daily., Disp: 180 tablet, Rfl: 1   empagliflozin (JARDIANCE) 10 MG TABS tablet, Take 1 tablet (10 mg total) by mouth daily., Disp: 90 tablet, Rfl: 1   famotidine (PEPCID) 40 MG tablet, Take 1 tablet (40 mg total) by mouth daily., Disp: 30 tablet, Rfl: 3   ferrous sulfate 325 (65 FE) MG tablet, Take 325 mg by mouth daily with breakfast., Disp: , Rfl:    hydrALAZINE (APRESOLINE) 50 MG tablet, Take 1 tablet (50 mg total) by mouth 3 (three) times daily., Disp: 270 tablet, Rfl: 1   mometasone-formoterol (DULERA) 100-5 MCG/ACT AERO, Inhale 2 puffs into the lungs 2 (two) times daily., Disp: 1 each, Rfl: 0   nystatin ointment (MYCOSTATIN), Apply 1 application. topically 2 (two) times daily as needed (rash/skin irritation)., Disp: 30 g, Rfl: 1   potassium chloride SA (KLOR-CON M) 20 MEQ tablet, Take 2 tablets (40 mEq total) by mouth once for 1 dose., Disp: 2 tablet, Rfl: 0   pregabalin (LYRICA) 75 MG capsule, Take 1 capsule (75 mg total) by mouth 2 (two) times daily., Disp: 180 capsule, Rfl: 1   Semaglutide,0.25 or 0.'5MG'$ /DOS, (OZEMPIC, 0.25 OR 0.5 MG/DOSE,) 2 MG/1.5ML SOPN, Inject 0.5 mg into the skin once a week. (Patient not taking: Reported on 12/21/2021), Disp: 2 mL, Rfl: 6   spironolactone (ALDACTONE) 25 MG tablet, Take 0.5 tablets (12.5 mg total) by mouth daily., Disp: 30 tablet, Rfl: 3   torsemide (DEMADEX) 20 MG tablet, Take 3 tablets (60 mg total) by mouth daily., Disp: 90 tablet, Rfl: 6 Allergies  Allergen Reactions   Lisinopril Swelling      Social History   Socioeconomic History   Marital  status: Legally Separated    Spouse name: Not on file   Number of children: 1   Years of education: Not on file   Highest education level: 9th grade  Occupational History   Occupation: disability  Tobacco Use   Smoking status: Never   Smokeless tobacco: Never  Vaping Use   Vaping Use: Never used  Substance and Sexual Activity   Alcohol use: No   Drug use: Not Currently    Types: Cocaine, Marijuana    Comment: remote h/o cocaine 2015 and marijuana 2018   Sexual activity: Not Currently  Other Topics Concern   Not on file  Social History Narrative   Lives with friend, Rosalee Kaufman and nephew.  One daughter.    Social Determinants of Health   Financial Resource Strain: Low Risk  (  08/31/2021)   Overall Financial Resource Strain (CARDIA)    Difficulty of Paying Living Expenses: Not very hard  Food Insecurity: No Food Insecurity (11/26/2021)   Hunger Vital Sign    Worried About Running Out of Food in the Last Year: Never true    Ran Out of Food in the Last Year: Never true  Transportation Needs: No Transportation Needs (11/26/2021)   PRAPARE - Hydrologist (Medical): No    Lack of Transportation (Non-Medical): No  Recent Concern: Transportation Needs - Unmet Transportation Needs (08/31/2021)   PRAPARE - Transportation    Lack of Transportation (Medical): No    Lack of Transportation (Non-Medical): Yes  Physical Activity: Inactive (09/01/2021)   Exercise Vital Sign    Days of Exercise per Week: 0 days    Minutes of Exercise per Session: 0 min  Stress: No Stress Concern Present (09/21/2021)   [place]    Feeling of Stress : Not at all  Social Connections: Moderately Isolated (09/01/2021)   Social Connection and Isolation Panel [NHANES]    Frequency of Communication with Friends and Family: More than three times a week    Frequency of Social Gatherings with Friends and Family: More than three  times a week    Attends Religious Services: More than 4 times per year    Active Member of [employer] or Organizations: No    Attends Archivist Meetings: Never    Marital Status: Separated  Intimate Partner Violence: Not At Risk (09/21/2021)   Humiliation, Afraid, Rape, and Kick questionnaire    Fear of Current or Ex-Partner: No    Emotionally Abused: No    Physically Abused: No    Sexually Abused: No    Physical Exam      Future Appointments  Date Time Provider [place]  12/29/2021  2:30 PM [facility] CCC-MM CARE MANAGER 2 THN-CCC None  12/31/2021 11:00 AM MC-HVSC PA/NP MC-HVSC None  02/10/2022  9:10 AM [name] Rakes, MD CHW-CHWW None  03/16/2022 10:00 AM Larey Dresser, MD MC-HVSC None  03/23/2022  1:40 PM [name] Breeding, MD CVD-NORTHLIN [place], [place] [facility] Paramedic  12/29/21

## 2021-12-29 NOTE — Patient Outreach (Signed)
Medicaid Managed Care   Nurse Care Manager Note  12/29/2021 Name:  [name] [name] MRN:  [id number] DOB:  26-Mar-1961  [name] [name] is an 61 y.o. year old female who is a primary patient of [name] Rakes, MD.  The [facility] Managed Care Coordination team was consulted for assistance with:    Chronic healthcare management needs, DM, HTN, COPD, OSA, HF, CKD  [name] was given information about Medicaid Managed Care Coordination team services today. [name] [name] Patient agreed to services and verbal consent obtained.  Engaged with patient by telephone for follow up visit in response to provider referral for case management and/or care coordination services.   Assessments/Interventions:  Review of past medical history, allergies, medications, health status, including review of consultants reports, laboratory and other test data, was performed as part of comprehensive evaluation and provision of chronic care management services.  SDOH (Social Determinants of Health) assessments and interventions performed: SDOH Interventions    Flowsheet Row Most Recent Value  SDOH Interventions   Financial Strain Interventions Intervention Not Indicated     Care Plan  Allergies  Allergen Reactions   Lisinopril Swelling   Medications Reviewed Today     Reviewed by [name] Medicus, RN (Registered Nurse) on 12/29/21 at 66  Med List Status: <None>   Medication Order Taking? Sig Documenting Provider Last Dose Status Informant  albuterol (VENTOLIN HFA) 108 (90 Base) MCG/ACT inhaler [id number] No Inhale 1 puff into the lungs every 4 (four) hours as needed for wheezing or shortness of breath. [name] Rakes, MD Taking Active Self, Spouse/Significant Other           Med Note [name] [name], [name] [name]   Tue Dec 22, 2021 11:09 AM) Taking only as needed  amLODipine (NORVASC) 2.5 MG tablet [id number] No Take 1 tablet (2.5 mg total) by mouth daily. [name] Rakes, MD Taking Active   atorvastatin (LIPITOR) 40 MG  tablet [id number] No Take 1 tablet (40 mg total) by mouth every morning. [name] Rakes, MD Taking Active   carvedilol (COREG) 25 MG tablet [id number] No Take 1 tablet (25 mg total) by mouth 2 (two) times daily. [name] Pandy, PA-C Taking Active            Med Note [name] [name], [place] Dec 22, 2021 11:16 AM) Now agrees to take Carvedilol twice a day as prescribed.  empagliflozin (JARDIANCE) 10 MG TABS tablet [id number] No Take 1 tablet (10 mg total) by mouth daily. [name] Rakes, MD Taking Active   famotidine (PEPCID) 40 MG tablet [id number] No Take 1 tablet (40 mg total) by mouth daily. [name] Bottcher, PA Taking Active   ferrous sulfate 325 (65 FE) MG tablet [id number] No Take 325 mg by mouth daily with breakfast. [provider] Taking Active   hydrALAZINE (APRESOLINE) 50 MG tablet [id number] No Take 1 tablet (50 mg total) by mouth 3 (three) times daily. [name] Rakes, MD Taking Active   mometasone-formoterol [facility]) 100-5 MCG/ACT AERO [id number] No Inhale 2 puffs into the lungs 2 (two) times daily. [name] Grayer, MD Taking Active Self, Spouse/Significant Other           Med Note [name] [name], [name] [name]   Tue Dec 22, 2021 11:22 AM) As needed  nystatin ointment (MYCOSTATIN) [id number] No Apply 1 application. topically 2 (two) times daily as needed (rash/skin irritation). [name] Rakes, MD Taking Active            Med Note [name] [name], [name] [name]   Tue Dec 22, 2021 11:23 AM) Taking  only as needed  potassium chloride SA (KLOR-CON M) 20 MEQ tablet [id number] No Take 2 tablets (40 mEq total) by mouth once for 1 dose. [name] [name]   pregabalin (LYRICA) 75 MG capsule [id number] No Take 1 capsule (75 mg total) by mouth 2 (two) times daily. [name] Rakes, MD Taking Active   Semaglutide,0.25 or 0.'5MG'$ /DOS, (OZEMPIC, 0.25 OR 0.5 MG/DOSE,) 2 MG/1.5ML SOPN [id number] No Inject 0.5 mg into the skin once a week. [name] Rakes, MD Taking Active            Med Note [name] [name],  [name] [name]   Tue Dec 22, 2021 11:30 AM) Needs re-fill w/ doctor approval.  Spoke with pharmacy  spironolactone (ALDACTONE) 25 MG tablet [id number] No Take 0.5 tablets (12.5 mg total) by mouth daily. [place], [name] [name], [name] Taking Active   torsemide (DEMADEX) 20 MG tablet [id number] No Take 3 tablets (60 mg total) by mouth daily. [name] [name] Taking Active            Patient Active Problem List   Diagnosis Date Noted   Acute on chronic heart failure with preserved ejection fraction (HFpEF) ([place]) 09/22/2021   Heart failure ([place]) 09/22/2021   CKD (chronic kidney disease) stage 4, GFR 15-29 ml/min ([place]) 07/03/2021   Acute respiratory failure due to COVID-19 ([place]) 05/06/2021   COVID-19 05/05/2021   CHF exacerbation ([place]) 04/02/2021   Chest pain 04/02/2021   Acute-on-chronic kidney injury ([place]) 04/02/2021   COPD exacerbation ([place]) 12/05/2020   CKD (chronic kidney disease), stage III ([place]) 12/04/2020   Anemia of chronic disease 12/04/2020   Obesity, Class III, BMI 40-49.9 (morbid obesity) ([place]) 07/27/2020   Acute respiratory disease due to COVID-19 virus 07/25/2020   Acute hypoxemic respiratory failure due to COVID-19 ([place]) 07/25/2020   Morbid obesity ([place]) 11/23/2019   Acute respiratory failure with hypoxia ([place]) 11/06/2019   Chronic kidney disease, stage 3b ([place]) 11/06/2019   COPD without exacerbation ([place])    Acute kidney failure ([place]) 08/18/2019   Left ventricular hypertrophy 07/21/2019   Educated about COVID-19 virus infection 78/58/8502   Acute diastolic HF (heart failure) (HCC)    Hypoxia    OSA (obstructive sleep apnea) 05/09/2019   Hyperglycemia 04/09/2019   Snoring 03/13/2019   Chronic anemia 08/17/2018   Chronic cystitis 08/03/2018   Diabetic neuropathy ([place]) 06/16/2017   Non compliance w medication regimen 04/12/2017   Family history of colon cancer in mother 11/17/2016   Dyspareunia in female 11/11/2016   Screen for colon cancer 11/11/2016   History of ovarian cyst  11/11/2016   Chronic hepatitis C without hepatic coma ([place]) 11/09/2016   Hyperlipidemia 10/07/2016   Type 2 diabetes mellitus with hyperlipidemia ([place]) 01/29/2014   Status post intraocular lens implant 11/08/2013   PCO (posterior capsular opacification) 09/28/2013   Pseudophakia 09/12/2013   GERD (gastroesophageal reflux disease) 09/06/2013   Hypertension 09/06/2013   ANEMIA, IRON DEFICIENCY 10/03/2009   LIPOMA 06/18/2009   TINEA PEDIS 05/07/2009   SUBSTANCE ABUSE, MULTIPLE 02/22/2007   HCVD (hypertensive cardiovascular disease) 02/22/2007   Conditions to be addressed/monitored per PCP order:  Chronic healthcare management needs, DM, HTN, COPD, OSA, HF, CKD, GERD  Care Plan : General Plan of Care (Adult)  Updates made by [name] Medicus, RN since 12/29/2021 12:00 AM     Problem: Quality of Life (General Plan of Care)   Priority: High  Onset Date: 10/29/2020     Long-Range Goal: Quality of Life Maintained  Start Date: 10/29/2020  Expected End Date: 02/26/2022  Recent Progress: Not on track  Priority: High  Note:    CARE PLAN ENTRY Medicaid Managed Care (see longitudinal plan of care for additional care plan information)  Current Barriers:  Chronic case management needs related to chronic health conditions. 12/29/21:  patient without complaint today-being followed by paramedicine weekly.    Nurse Case Manager Clinical Goal(s):  Over the next 30 days, patient will verbalize understanding of plan for decreased swelling.  Over the next 30 days, patient will work with provider to address needs  Over the next 30 days, patient will continue to work with Pharmacist. Over the next 30 days, patient will attend all scheduled appointments.  Interventions:  Inter-disciplinary care team collaboration (see longitudinal plan of care) Reviewed medications with patient. Discussed plans with patient for ongoing care management follow up and provided patient with direct contact information for  care management team. Collaboration with Pharmacist for review of medications. Pharmacy referral for medication review. Collaborated with BSW for resources. BSW  Referral for resources-completed  Plan:  Patient will follow up with provider and fill all prescriptions RNCM will follow up with patient within 30 days.   Follow Up:  Patient agrees to Care Plan and Follow-up.  Plan: The Managed Medicaid care management team will reach out to the patient again over the next 30 business  days. and The  Patient has been provided with contact information for the Managed Medicaid care management team and has been advised to call with any health related questions or concerns.  Date/time of next scheduled RN care management/care coordination outreach: 02/04/22 at 230.

## 2021-12-30 NOTE — Progress Notes (Signed)
Advanced Heart Failure Clinic Note    PCP: [name], MD PCP-Cardiologist: Minus Breeding, MD  HF Cardiologist: Dr. Aundra Dubin  HPI:  [name] [name] is a 61 y.o. with a history of HFpEF, DMII, HTN, OSA, and hyperlipidemia.    Had sleep study 08/18/21 that demonstrated mild obstructive sleep apnea overall (AHI 13.0/h; RDI 14.6/h); however, sleep apnea was moderately severe during REM sleep (AHI 29.4/h. Severe oxygen desaturation was noted during this study (Min O2 54.0%). Time spent < 89% was 81.7 minutes.   Admitted 09/15/21 with a/c CHF. Diuresed with IV lasix and transitioned to torsemide 40 mg daily.    Admitted 09/22/21 with with A/C HFpEF. Diuresed with IV lasix and transitioned to torsemide 20 mg daily, weight 233 lbs.  Referred to [facility] clinic. Post hospital follow up 3/23, she was volume overloaded in setting of dietary indiscretion, ReDs 47%. Torsemide increased and CarioMEMs discussed.  Had return f/u in [facility] clinic for volume assessment 3/29. Torsemide increased to alternate between 60 mg daily, 40 mg the next. She was still undecided regarding CardioMEMs.   Had HF f/u 12/01/21. Reds 48%. Torsemide increased to 60 mg twice a day x 2 days then 60 mg daily. Arlyce Harman was started.   Today she returns for HF follow up with her daughter. Overall feeling much better. Able to walk up steps. Able to walk more.  Denies SOB/PND/Orthopnea. Appetite not great. Had some dizziness earlier but improved with a coke. Taking Ozempic. No fever or chills. Weight at home  190-193 pounds. Taking all medications. Cardiomems approval pending.   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 18 g 0   amLODipine (NORVASC) 2.5 MG tablet Take 1 tablet (2.5 mg total) by mouth daily. 90 tablet 1   atorvastatin (LIPITOR) 40 MG tablet Take 1 tablet (40 mg total) by mouth every morning. 90 tablet 1   carvedilol (COREG) 25 MG tablet Take 1 tablet (25 mg total) by mouth 2 (two) times daily. 180 tablet 1   empagliflozin (JARDIANCE) 10 MG TABS tablet Take 1 tablet (10 mg total) by mouth daily. 90 tablet 1   famotidine (PEPCID) 40 MG tablet Take 1 tablet (40 mg total) by mouth daily. 30 tablet 3   ferrous sulfate 325 (65 FE) MG tablet Take 325 mg by mouth daily with breakfast.     hydrALAZINE (APRESOLINE) 50 MG tablet Take 1 tablet (50 mg total) by mouth 3 (three) times daily. 270 tablet 1   mometasone-formoterol (DULERA) 100-5 MCG/ACT AERO Inhale 2 puffs into the lungs 2 (two) times daily. 1 each 0   nystatin ointment (MYCOSTATIN) Apply 1 application. topically 2 (two) times daily as  needed (rash/skin irritation). 30 g 1   pregabalin (LYRICA) 75 MG capsule Take 1 capsule (75 mg total) by mouth 2 (two) times daily. 180 capsule 1   Semaglutide,0.25 or 0.'5MG'$ /DOS, (OZEMPIC, 0.25 OR 0.5 MG/DOSE,) 2 MG/1.5ML SOPN Inject 0.5 mg into the skin once a week. 2 mL 6   spironolactone (ALDACTONE) 25 MG tablet Take 0.5 tablets (12.5 mg total) by mouth daily. 30 tablet 3   torsemide (DEMADEX) 20 MG tablet Take 3 tablets (60 mg total) by mouth daily. 90 tablet 6   potassium chloride SA (KLOR-CON M) 20 MEQ tablet Take 2 tablets (40 mEq total) by mouth once for 1 dose. (Patient not taking:  Reported on 12/31/2021) 2 tablet 0   No current facility-administered medications for this encounter.   Allergies  Allergen Reactions   Lisinopril Swelling   Social History   Socioeconomic History   Marital status: Legally Separated    Spouse name: Not on file   Number of children: 1   Years of education: Not on file   Highest education level: 9th grade  Occupational History   Occupation: disability  Tobacco Use   Smoking status: Never   Smokeless tobacco: Never  Vaping Use   Vaping Use: Never used  Substance and Sexual Activity   Alcohol use: No   Drug use: Not Currently    Types: Cocaine, Marijuana    Comment: remote h/o cocaine 2015 and marijuana 2018   Sexual activity: Not Currently  Other Topics Concern   Not on file  Social History Narrative   Lives with friend, Rosalee Kaufman and nephew.  One daughter.    Social Determinants of Health   Financial Resource Strain: Low Risk  (12/29/2021)   Overall Financial Resource Strain (CARDIA)    Difficulty of Paying Living Expenses: Not very hard  Food Insecurity: No Food Insecurity (11/26/2021)   Hunger Vital Sign    Worried About Running Out of Food in the Last Year: Never true    Ran Out of Food in the Last Year: Never true  Transportation Needs: No Transportation Needs (11/26/2021)   PRAPARE - Hydrologist (Medical): No    Lack of Transportation (Non-Medical): No  Recent Concern: Transportation Needs - Unmet Transportation Needs (08/31/2021)   PRAPARE - Transportation    Lack of Transportation (Medical): No    Lack of Transportation (Non-Medical): Yes  Physical Activity: Inactive (09/01/2021)   Exercise Vital Sign    Days of Exercise per Week: 0 days    Minutes of Exercise per Session: 0 min  Stress: No Stress Concern Present (09/21/2021)   [place]    Feeling of Stress : Not at all  Social Connections: Moderately Integrated  (12/29/2021)   Social Connection and Isolation Panel [NHANES]    Frequency of Communication with Friends and Family: More than three times a week    Frequency of Social Gatherings with Friends and Family: More than three times a week    Attends Religious Services: More than 4 times per year    Active Member of [employer] or Organizations: No    Attends Archivist Meetings: Never    Marital Status: Living with partner  Intimate Partner Violence: Not At Risk (09/21/2021)   Humiliation, Afraid, Rape, and Kick questionnaire    Fear of Current or Ex-Partner: No    Emotionally Abused: No    Physically Abused: No    Sexually Abused: No  Family History  Problem Relation Age of Onset   Colon cancer Mother    Liver disease Sister    Other Neg Hx    Breast cancer Neg Hx    Esophageal cancer Neg Hx    Rectal cancer Neg Hx    Tremor Neg Hx    BP 120/62   Pulse 91   Wt 87.1 kg (192 lb)   LMP 04/01/2010   SpO2 98%   BMI 32.96 kg/m   Wt Readings from Last 3 Encounters:  12/31/21 87.1 kg (192 lb)  12/29/21 87.3 kg (192 lb 6.4 oz)  12/22/21 86.2 kg (190 lb)   PHYSICAL EXAM: General:   No resp difficulty. Arrived in a wheel chair.  HEENT: normal Neck: supple. no JVD. Carotids 2+ bilat; no bruits. No lymphadenopathy or thryomegaly appreciated. Cor: PMI nondisplaced. Regular rate & rhythm. No rubs, gallops or murmurs. Lungs: clear Abdomen: soft, nontender, nondistended. No hepatosplenomegaly. No bruits or masses. Good bowel sounds. Extremities: no cyanosis, clubbing, rash, edema Neuro: alert & orientedx3, cranial nerves grossly intact. moves all 4 extremities w/o difficulty. Affect pleasant  ASSESSMENT & PLAN: 1. Chronic HFpEF - Echo (3/23): EF 60-65% Grade II DD.  - NYHA II. Reds Clip 25%. I reviewed BMET from 6/5, stable.   - Volume status stable. Continue torsemide  60 mg daily. - Continue  spiro 12.5 mg daily.  - Continue Jardiance 10 mg daily.  - Continue Coreg 25 mg bid.   - NO entresto history of angioedema hen taking lisinopril.  - Continue hydralazine 50 mg tid.  - Discussed CardioMems and she remains interested. Will check on approval.      2. OSA - Per Dr Claiborne Billings.  - Had sleep study 07/2022- Mild obstructive sleep apnea overall (AHI 13.0/h; RDI 14.6/h); however, sleep apnea was moderately severe during REM sleep (AHI 29.4/h.Severe oxygen desaturation was noted during this study (Min O2 54.0%). Time spent < 89% was 81.7 minutes. - Needs CPAP titration before she can start CPAP.    3. Obesity  Body mass index is 32.96 kg/m. - She is on Ozempic.  4. CKD Stage IIIb  - Followed by Nephrology. - Creatinine baseline 1.7-1.9 - BMET 12/21/21 stable.   5. Hypertension  - Controlled.   6. Medication non-compliance - Low health literacy - Doing great! Taking all medications.  - Followed by HF Paramedicine.   Follow up Dr Aundra Dubin in August. No changes. Congratulated on medication compliance.   Darrick Grinder, NP 12/31/21

## 2021-12-31 ENCOUNTER — Ambulatory Visit (HOSPITAL_COMMUNITY)
Admission: RE | Admit: 2021-12-31 | Discharge: 2021-12-31 | Disposition: A | Discharge status: Home / Self Care | Payer: Medicare Other | Source: Ambulatory Visit | Attending: Adult Health | Admitting: Adult Health

## 2021-12-31 ENCOUNTER — Encounter (HOSPITAL_COMMUNITY): Payer: Self-pay

## 2021-12-31 VITALS — BP 120/62 | HR 91 | Wt 192.0 lb

## 2021-12-31 DIAGNOSIS — E1122 Type 2 diabetes mellitus with diabetic chronic kidney disease: Secondary | ICD-10-CM | POA: Diagnosis not present

## 2021-12-31 DIAGNOSIS — I130 Hypertensive heart and chronic kidney disease with heart failure and stage 1 through stage 4 chronic kidney disease, or unspecified chronic kidney disease: Secondary | ICD-10-CM | POA: Diagnosis not present

## 2021-12-31 DIAGNOSIS — G4733 Obstructive sleep apnea (adult) (pediatric): Secondary | ICD-10-CM

## 2021-12-31 DIAGNOSIS — N184 Chronic kidney disease, stage 4 (severe): Secondary | ICD-10-CM | POA: Diagnosis not present

## 2021-12-31 DIAGNOSIS — Z556 Problems related to health literacy: Secondary | ICD-10-CM | POA: Insufficient documentation

## 2021-12-31 DIAGNOSIS — E669 Obesity, unspecified: Secondary | ICD-10-CM | POA: Diagnosis not present

## 2021-12-31 DIAGNOSIS — N1832 Chronic kidney disease, stage 3b: Secondary | ICD-10-CM | POA: Insufficient documentation

## 2021-12-31 DIAGNOSIS — Z7984 Long term (current) use of oral hypoglycemic drugs: Secondary | ICD-10-CM | POA: Insufficient documentation

## 2021-12-31 DIAGNOSIS — Z6832 Body mass index (BMI) 32.0-32.9, adult: Secondary | ICD-10-CM | POA: Diagnosis not present

## 2021-12-31 DIAGNOSIS — Z7901 Long term (current) use of anticoagulants: Secondary | ICD-10-CM | POA: Insufficient documentation

## 2021-12-31 DIAGNOSIS — Z91148 Patient's other noncompliance with medication regimen for other reason: Secondary | ICD-10-CM | POA: Insufficient documentation

## 2021-12-31 DIAGNOSIS — Z79899 Other long term (current) drug therapy: Secondary | ICD-10-CM | POA: Insufficient documentation

## 2021-12-31 DIAGNOSIS — I5032 Chronic diastolic (congestive) heart failure: Secondary | ICD-10-CM | POA: Diagnosis not present

## 2021-12-31 NOTE — Patient Instructions (Signed)
No Labs done today.  No other medication changes were made.   Your physician recommends that you keep your scheduled follow-up appointment.  If you have any questions or concerns before your next appointment please send [place] a message through [place] or call our office at [phone].    TO LEAVE A MESSAGE FOR THE NURSE SELECT OPTION 2, PLEASE LEAVE A MESSAGE INCLUDING: YOUR NAME DATE OF BIRTH CALL BACK NUMBER REASON FOR CALL**this is important as we prioritize the call backs  YOU WILL RECEIVE A CALL BACK THE SAME DAY AS LONG AS YOU CALL BEFORE 4:00 PM   Do the following things EVERYDAY: Weigh yourself in the morning before breakfast. Write it down and keep it in a log. Take your medicines as prescribed Eat low salt foods--Limit salt (sodium) to 2000 mg per day.  Stay as active as you can everyday Limit all fluids for the day to less than 2 liters   At the [facility], you and your health needs are our priority. As part of our continuing mission to provide you with exceptional heart care, we have created designated Provider Care Teams. These Care Teams include your primary Cardiologist (physician) and Advanced Practice Providers (APPs- Physician Assistants and Nurse Practitioners) who all work together to provide you with the care you need, when you need it.   You may see any of the following providers on your designated Care Team at your next follow up: Dr Glori Bickers Dr Haynes Kerns, NP Lyda Jester, [place] Audry Riles, PharmD   Please be sure to bring in all your medications bottles to every appointment.

## 2021-12-31 NOTE — Progress Notes (Signed)
ReDS Vest / Clip - 12/31/21 1100       ReDS Vest / Clip   Station Marker A    Ruler Value 29    ReDS Value Range Low volume    ReDS Actual Value 25

## 2022-01-05 ENCOUNTER — Other Ambulatory Visit (HOSPITAL_COMMUNITY): Payer: Self-pay | Admitting: Emergency Medicine

## 2022-01-05 NOTE — Progress Notes (Signed)
Paramedicine Encounter    Patient ID: [name] [name], female    DOB: 06/21/1961, 61 y.o.   MRN: [id number]   LMP 04/01/2010  Weight yesterday-didn't weigh Last visit weight-192 Cbg 183  ATF Ms. Baine CA&O x 4, skin W&D w/ good color.  Pt has not complaints of chest pain or SOB.  No peripheral edema.  Lung sounds clear in all fields.  She was happy to tell me she got a good report at her last clinic visit.  She did very well with her med compliance.  Pill box reconciled.  Blood pressure was up but she had not yet taken her meds for the day.  Discussed nutrition and good food choices.  She states she has had decreased appetite since she has been taking the Ozempic which I explained is a side effect.  She has no other needs at this time and I will see her again next Tues.  Patient Care Team: [name], MD as PCP - General (Family Medicine) Minus Breeding, MD as PCP - Cardiology (Cardiology) Craft, Lorel Monaco, RN as Case Manager [facility], Tami Lin, [place] as Physician Assistant (Cardiology)  Patient Active Problem List   Diagnosis Date Noted   Acute on chronic heart failure with preserved ejection fraction (HFpEF) ([place]) 09/22/2021   Heart failure ([place]) 09/22/2021   CKD (chronic kidney disease) stage 4, GFR 15-29 ml/min ([place]) 07/03/2021   Acute respiratory failure due to COVID-19 ([place]) 05/06/2021   COVID-19 05/05/2021   CHF exacerbation ([place]) 04/02/2021   Chest pain 04/02/2021   Acute-on-chronic kidney injury ([place]) 04/02/2021   COPD exacerbation ([place]) 12/05/2020   CKD (chronic kidney disease), stage III ([place]) 12/04/2020   Anemia of chronic disease 12/04/2020   Obesity, Class III, BMI 40-49.9 (morbid obesity) ([place]) 07/27/2020   Acute respiratory disease due to COVID-19 virus 07/25/2020   Acute hypoxemic respiratory failure due to COVID-19 ([place]) 07/25/2020   Morbid obesity ([place]) 11/23/2019   Acute respiratory failure with hypoxia ([place]) 11/06/2019   Chronic kidney disease, stage 3b  ([place]) 11/06/2019   COPD without exacerbation ([place])    Acute kidney failure ([place]) 08/18/2019   Left ventricular hypertrophy 07/21/2019   Educated about COVID-19 virus infection 73/22/0254   Acute diastolic HF (heart failure) (HCC)    Hypoxia    OSA (obstructive sleep apnea) 05/09/2019   Hyperglycemia 04/09/2019   Snoring 03/13/2019   Chronic anemia 08/17/2018   Chronic cystitis 08/03/2018   Diabetic neuropathy ([place]) 06/16/2017   Non compliance w medication regimen 04/12/2017   Family history of colon cancer in mother 11/17/2016   Dyspareunia in female 11/11/2016   Screen for colon cancer 11/11/2016   History of ovarian cyst 11/11/2016   Chronic hepatitis C without hepatic coma ([place]) 11/09/2016   Hyperlipidemia 10/07/2016   Type 2 diabetes mellitus with hyperlipidemia ([place]) 01/29/2014   Status post intraocular lens implant 11/08/2013   PCO (posterior capsular opacification) 09/28/2013   Pseudophakia 09/12/2013   GERD (gastroesophageal reflux disease) 09/06/2013   Hypertension 09/06/2013   ANEMIA, IRON DEFICIENCY 10/03/2009   LIPOMA 06/18/2009   TINEA PEDIS 05/07/2009   SUBSTANCE ABUSE, MULTIPLE 02/22/2007   HCVD (hypertensive cardiovascular disease) 02/22/2007    Current Outpatient Medications:    albuterol (VENTOLIN HFA) 108 (90 Base) MCG/ACT inhaler, Inhale 1 puff into the lungs every 4 (four) hours as needed for wheezing or shortness of breath., Disp: 18 g, Rfl: 0   amLODipine (NORVASC) 2.5 MG tablet, Take 1 tablet (2.5 mg total) by mouth daily., Disp:  90 tablet, Rfl: 1   atorvastatin (LIPITOR) 40 MG tablet, Take 1 tablet (40 mg total) by mouth every morning., Disp: 90 tablet, Rfl: 1   carvedilol (COREG) 25 MG tablet, Take 1 tablet (25 mg total) by mouth 2 (two) times daily., Disp: 180 tablet, Rfl: 1   empagliflozin (JARDIANCE) 10 MG TABS tablet, Take 1 tablet (10 mg total) by mouth daily., Disp: 90 tablet, Rfl: 1   famotidine (PEPCID) 40 MG tablet, Take 1 tablet (40 mg total)  by mouth daily., Disp: 30 tablet, Rfl: 3   ferrous sulfate 325 (65 FE) MG tablet, Take 325 mg by mouth daily with breakfast., Disp: , Rfl:    hydrALAZINE (APRESOLINE) 50 MG tablet, Take 1 tablet (50 mg total) by mouth 3 (three) times daily., Disp: 270 tablet, Rfl: 1   mometasone-formoterol (DULERA) 100-5 MCG/ACT AERO, Inhale 2 puffs into the lungs 2 (two) times daily., Disp: 1 each, Rfl: 0   nystatin ointment (MYCOSTATIN), Apply 1 application. topically 2 (two) times daily as needed (rash/skin irritation)., Disp: 30 g, Rfl: 1   potassium chloride SA (KLOR-CON M) 20 MEQ tablet, Take 2 tablets (40 mEq total) by mouth once for 1 dose. (Patient not taking: Reported on 12/31/2021), Disp: 2 tablet, Rfl: 0   pregabalin (LYRICA) 75 MG capsule, Take 1 capsule (75 mg total) by mouth 2 (two) times daily., Disp: 180 capsule, Rfl: 1   Semaglutide,0.25 or 0.'5MG'$ /DOS, (OZEMPIC, 0.25 OR 0.5 MG/DOSE,) 2 MG/1.5ML SOPN, Inject 0.5 mg into the skin once a week., Disp: 2 mL, Rfl: 6   spironolactone (ALDACTONE) 25 MG tablet, Take 0.5 tablets (12.5 mg total) by mouth daily., Disp: 30 tablet, Rfl: 3   torsemide (DEMADEX) 20 MG tablet, Take 3 tablets (60 mg total) by mouth daily., Disp: 90 tablet, Rfl: 6 Allergies  Allergen Reactions   Lisinopril Swelling      Social History   Socioeconomic History   Marital status: Legally Separated    Spouse name: Not on file   Number of children: 1   Years of education: Not on file   Highest education level: 9th grade  Occupational History   Occupation: disability  Tobacco Use   Smoking status: Never   Smokeless tobacco: Never  Vaping Use   Vaping Use: Never used  Substance and Sexual Activity   Alcohol use: No   Drug use: Not Currently    Types: Cocaine, Marijuana    Comment: remote h/o cocaine 2015 and marijuana 2018   Sexual activity: Not Currently  Other Topics Concern   Not on file  Social History Narrative   Lives with friend, Rosalee Kaufman and nephew.  One daughter.     Social Determinants of Health   Financial Resource Strain: Low Risk  (12/29/2021)   Overall Financial Resource Strain (CARDIA)    Difficulty of Paying Living Expenses: Not very hard  Food Insecurity: No Food Insecurity (11/26/2021)   Hunger Vital Sign    Worried About Running Out of Food in the Last Year: Never true    Ran Out of Food in the Last Year: Never true  Transportation Needs: No Transportation Needs (11/26/2021)   PRAPARE - Hydrologist (Medical): No    Lack of Transportation (Non-Medical): No  Recent Concern: Transportation Needs - Unmet Transportation Needs (08/31/2021)   PRAPARE - Transportation    Lack of Transportation (Medical): No    Lack of Transportation (Non-Medical): Yes  Physical Activity: Inactive (09/01/2021)   Exercise Vital Sign  Days of Exercise per Week: 0 days    Minutes of Exercise per Session: 0 min  Stress: No Stress Concern Present (09/21/2021)   [place]    Feeling of Stress : Not at all  Social Connections: Moderately Integrated (12/29/2021)   Social Connection and Isolation Panel [NHANES]    Frequency of Communication with Friends and Family: More than three times a week    Frequency of Social Gatherings with Friends and Family: More than three times a week    Attends Religious Services: More than 4 times per year    Active Member of [employer] or Organizations: No    Attends Archivist Meetings: Never    Marital Status: Living with partner  Intimate Partner Violence: Not At Risk (09/21/2021)   Humiliation, Afraid, Rape, and Kick questionnaire    Fear of Current or Ex-Partner: No    Emotionally Abused: No    Physically Abused: No    Sexually Abused: No    Physical Exam      Future Appointments  Date Time Provider [place]  02/04/2022  2:30 PM [facility] CCC-MM CARE MANAGER 2 THN-CCC None  02/10/2022  9:10 AM [name], MD CHW-CHWW  None  03/16/2022 10:00 AM Larey Dresser, MD MC-HVSC None  03/23/2022  1:40 PM Minus Breeding, MD CVD-NORTHLIN [place], [facility] Paramedic  01/05/22

## 2022-01-06 ENCOUNTER — Telehealth (HOSPITAL_COMMUNITY): Payer: Self-pay | Admitting: Licensed Clinical Social Worker

## 2022-01-06 NOTE — Telephone Encounter (Addendum)
HF Paramedicine Team Based Care Meeting  HF MD- NA  HF NP - [place] NP-C   [facility] admit within the last 30 days for heart failure? no  Medications concerns? Has a good understanding of how to take and seems to be taking as prescribed  Education needs? Diabetes education on how to check sugars.  Needs continued education on diet (eating hot dogs and drinking sodas)  SDOH - non reported- good family support  Eligible for discharge? Newer referral so needs to be followed or awhile longer.  Jorge Ny, LCSW Clinical Social Worker [facility] Desk#: [phone] Cell#: [phone]

## 2022-01-12 ENCOUNTER — Other Ambulatory Visit (HOSPITAL_COMMUNITY): Payer: Self-pay | Admitting: Emergency Medicine

## 2022-01-12 NOTE — Progress Notes (Signed)
Paramedicine Encounter    Patient ID: [name] [name], female    DOB: 1961-02-11, 61 y.o.   MRN: [id number]   BP 120/70 (BP Location: Right Arm, Patient Position: Sitting, Cuff Size: Normal)   Pulse 88   Resp 16   Wt 192 lb 6.4 oz (87.3 kg)   LMP 04/01/2010   SpO2 98%   BMI 33.03 kg/m  Weight yesterday-not taken Last visit weight-192lb CBG 178  ATF Ms. [place]ty CA&O x 4, skin W&D w/ good color.  Pt. Denies chest pain or SOB.  Lung sounds clear and equal bilat.  No edema noted.  She completed all medications from her med box and box refilled.  She is doing well with her weight .  CBG was slightly elevated but she confessed to eating two [employer] cups last night.  She states she is feeling good overall and has no complaints.    Patient Care Team: Hoy Register, MD as PCP - General (Family Medicine) Rollene Rotunda, MD as PCP - Cardiology (Cardiology) Craft, Calvert Cantor, RN as Case Manager [facility], Roe Rutherford, [place] as Physician Assistant (Cardiology)  Patient Active Problem List   Diagnosis Date Noted   Acute on chronic heart failure with preserved ejection fraction (HFpEF) (HCC) 09/22/2021   Heart failure (HCC) 09/22/2021   CKD (chronic kidney disease) stage 4, GFR 15-29 ml/min (HCC) 07/03/2021   Acute respiratory failure due to COVID-19 (HCC) 05/06/2021   COVID-19 05/05/2021   CHF exacerbation (HCC) 04/02/2021   Chest pain 04/02/2021   Acute-on-chronic kidney injury (HCC) 04/02/2021   COPD exacerbation (HCC) 12/05/2020   CKD (chronic kidney disease), stage III (HCC) 12/04/2020   Anemia of chronic disease 12/04/2020   Obesity, Class III, BMI 40-49.9 (morbid obesity) (HCC) 07/27/2020   Acute respiratory disease due to COVID-19 virus 07/25/2020   Acute hypoxemic respiratory failure due to COVID-19 (HCC) 07/25/2020   Morbid obesity (HCC) 11/23/2019   Acute respiratory failure with hypoxia (HCC) 11/06/2019   Chronic kidney disease, stage 3b (HCC) 11/06/2019   COPD without  exacerbation (HCC)    Acute kidney failure (HCC) 08/18/2019   Left ventricular hypertrophy 07/21/2019   Educated about COVID-19 virus infection 07/21/2019   Acute diastolic HF (heart failure) (HCC)    Hypoxia    OSA (obstructive sleep apnea) 05/09/2019   Hyperglycemia 04/09/2019   Snoring 03/13/2019   Chronic anemia 08/17/2018   Chronic cystitis 08/03/2018   Diabetic neuropathy (HCC) 06/16/2017   Non compliance w medication regimen 04/12/2017   Family history of colon cancer in mother 11/17/2016   Dyspareunia in female 11/11/2016   Screen for colon cancer 11/11/2016   History of ovarian cyst 11/11/2016   Chronic hepatitis C without hepatic coma (HCC) 11/09/2016   Hyperlipidemia 10/07/2016   Type 2 diabetes mellitus with hyperlipidemia (HCC) 01/29/2014   Status post intraocular lens implant 11/08/2013   PCO (posterior capsular opacification) 09/28/2013   Pseudophakia 09/12/2013   GERD (gastroesophageal reflux disease) 09/06/2013   Hypertension 09/06/2013   ANEMIA, IRON DEFICIENCY 10/03/2009   LIPOMA 06/18/2009   TINEA PEDIS 05/07/2009   SUBSTANCE ABUSE, MULTIPLE 02/22/2007   HCVD (hypertensive cardiovascular disease) 02/22/2007    Current Outpatient Medications:    albuterol (VENTOLIN HFA) 108 (90 Base) MCG/ACT inhaler, Inhale 1 puff into the lungs every 4 (four) hours as needed for wheezing or shortness of breath., Disp: 18 g, Rfl: 0   amLODipine (NORVASC) 2.5 MG tablet, Take 1 tablet (2.5 mg total) by mouth daily., Disp: 90 tablet, Rfl: 1  atorvastatin (LIPITOR) 40 MG tablet, Take 1 tablet (40 mg total) by mouth every morning., Disp: 90 tablet, Rfl: 1   carvedilol (COREG) 25 MG tablet, Take 1 tablet (25 mg total) by mouth 2 (two) times daily., Disp: 180 tablet, Rfl: 1   empagliflozin (JARDIANCE) 10 MG TABS tablet, Take 1 tablet (10 mg total) by mouth daily., Disp: 90 tablet, Rfl: 1   famotidine (PEPCID) 40 MG tablet, Take 1 tablet (40 mg total) by mouth daily., Disp: 30  tablet, Rfl: 3   ferrous sulfate 325 (65 FE) MG tablet, Take 325 mg by mouth daily with breakfast., Disp: , Rfl:    hydrALAZINE (APRESOLINE) 50 MG tablet, Take 1 tablet (50 mg total) by mouth 3 (three) times daily., Disp: 270 tablet, Rfl: 1   mometasone-formoterol (DULERA) 100-5 MCG/ACT AERO, Inhale 2 puffs into the lungs 2 (two) times daily., Disp: 1 each, Rfl: 0   nystatin ointment (MYCOSTATIN), Apply 1 application. topically 2 (two) times daily as needed (rash/skin irritation). (Patient not taking: Reported on 01/05/2022), Disp: 30 g, Rfl: 1   potassium chloride SA (KLOR-CON M) 20 MEQ tablet, Take 2 tablets (40 mEq total) by mouth once for 1 dose. (Patient not taking: Reported on 12/31/2021), Disp: 2 tablet, Rfl: 0   pregabalin (LYRICA) 75 MG capsule, Take 1 capsule (75 mg total) by mouth 2 (two) times daily., Disp: 180 capsule, Rfl: 1   Semaglutide,0.25 or 0.5MG /DOS, (OZEMPIC, 0.25 OR 0.5 MG/DOSE,) 2 MG/1.5ML SOPN, Inject 0.5 mg into the skin once a week., Disp: 2 mL, Rfl: 6   spironolactone (ALDACTONE) 25 MG tablet, Take 0.5 tablets (12.5 mg total) by mouth daily., Disp: 30 tablet, Rfl: 3   torsemide (DEMADEX) 20 MG tablet, Take 3 tablets (60 mg total) by mouth daily., Disp: 90 tablet, Rfl: 6 Allergies  Allergen Reactions   Lisinopril Swelling      Social History   Socioeconomic History   Marital status: Legally Separated    Spouse name: Not on file   Number of children: 1   Years of education: Not on file   Highest education level: 9th grade  Occupational History   Occupation: disability  Tobacco Use   Smoking status: Never   Smokeless tobacco: Never  Vaping Use   Vaping Use: Never used  Substance and Sexual Activity   Alcohol use: No   Drug use: Not Currently    Types: Cocaine, Marijuana    Comment: remote h/o cocaine 2015 and marijuana 2018   Sexual activity: Not Currently  Other Topics Concern   Not on file  Social History Narrative   Lives with friend, [name] and  nephew.  One daughter.    Social Determinants of Health   Financial Resource Strain: Low Risk  (12/29/2021)   Overall Financial Resource Strain (CARDIA)    Difficulty of Paying Living Expenses: Not very hard  Food Insecurity: No Food Insecurity (11/26/2021)   Hunger Vital Sign    Worried About Running Out of Food in the Last Year: Never true    Ran Out of Food in the Last Year: Never true  Transportation Needs: No Transportation Needs (11/26/2021)   PRAPARE - Administrator, Civil Service (Medical): No    Lack of Transportation (Non-Medical): No  Recent Concern: Transportation Needs - Unmet Transportation Needs (08/31/2021)   PRAPARE - Transportation    Lack of Transportation (Medical): No    Lack of Transportation (Non-Medical): Yes  Physical Activity: Inactive (09/01/2021)   Exercise Vital Sign  Days of Exercise per Week: 0 days    Minutes of Exercise per Session: 0 min  Stress: No Stress Concern Present (09/21/2021)   [employer] of Occupational Health - Occupational Stress Questionnaire    Feeling of Stress : Not at all  Social Connections: Moderately Integrated (12/29/2021)   Social Connection and Isolation Panel [NHANES]    Frequency of Communication with Friends and Family: More than three times a week    Frequency of Social Gatherings with Friends and Family: More than three times a week    Attends Religious Services: More than 4 times per year    Active Member of [employer] or Organizations: No    Attends Banker Meetings: Never    Marital Status: Living with partner  Intimate Partner Violence: Not At Risk (09/21/2021)   Humiliation, Afraid, Rape, and Kick questionnaire    Fear of Current or Ex-Partner: No    Emotionally Abused: No    Physically Abused: No    Sexually Abused: No    Physical Exam      Future Appointments  Date Time Provider Department Center  02/04/2022  2:30 PM [facility] CCC-MM CARE MANAGER 2 THN-CCC None  02/10/2022  9:10 AM  Hoy Register, MD CHW-CHWW None  03/16/2022 10:00 AM Laurey Morale, MD MC-HVSC None  03/23/2022  1:40 PM Rollene Rotunda, MD CVD-NORTHLIN [facility]       Beatrix Shipper, EMT-Paramedic [phone] [facility] Paramedic  01/12/22

## 2022-01-20 ENCOUNTER — Other Ambulatory Visit (HOSPITAL_COMMUNITY): Payer: Self-pay | Admitting: Emergency Medicine

## 2022-01-20 NOTE — Progress Notes (Signed)
Paramedicine Encounter    Patient ID: [name] [name], female    DOB: 27-Mar-1961, 61 y.o.   MRN: [id number]   BP (!) 162/78 (BP Location: Left Arm, Patient Position: Sitting, Cuff Size: Normal)   Pulse 91   Resp 16   LMP 04/01/2010   SpO2 93%  Weight yesterday-didn't weigh Last visit weight-192lb  ATF Ms. [place] CA&O x 4, skin warm and dry with good color.  Pt has no complaints of chest pain or  SOB and no edema or JVD noted.  Lung sounds clear and equal bilat.  She was hypertensive but has not taken her morning medications and has had no breakfast. She does say she's tired and didn't get home till 2:00 a.m. this morning from 4th of July celebrations.  She has been 100% compliant with all her medications.  Med box reconciled.  Home visit complete.  Patient Care Team: [name], MD as PCP - General (Family Medicine) Minus Breeding, MD as PCP - Cardiology (Cardiology) Craft, Lorel Monaco, RN as Case Manager [facility], Tami Lin, [place] as Physician Assistant (Cardiology)  Patient Active Problem List   Diagnosis Date Noted   Acute on chronic heart failure with preserved ejection fraction (HFpEF) ([place]) 09/22/2021   Heart failure ([place]) 09/22/2021   CKD (chronic kidney disease) stage 4, GFR 15-29 ml/min ([place]) 07/03/2021   Acute respiratory failure due to COVID-19 ([place]) 05/06/2021   COVID-19 05/05/2021   CHF exacerbation ([place]) 04/02/2021   Chest pain 04/02/2021   Acute-on-chronic kidney injury ([place]) 04/02/2021   COPD exacerbation ([place]) 12/05/2020   CKD (chronic kidney disease), stage III ([place]) 12/04/2020   Anemia of chronic disease 12/04/2020   Obesity, Class III, BMI 40-49.9 (morbid obesity) ([place]) 07/27/2020   Acute respiratory disease due to COVID-19 virus 07/25/2020   Acute hypoxemic respiratory failure due to COVID-19 ([place]) 07/25/2020   Morbid obesity ([place]) 11/23/2019   Acute respiratory failure with hypoxia ([place]) 11/06/2019   Chronic kidney disease, stage 3b ([place]) 11/06/2019    COPD without exacerbation ([place])    Acute kidney failure ([place]) 08/18/2019   Left ventricular hypertrophy 07/21/2019   Educated about COVID-19 virus infection 33/54/5625   Acute diastolic HF (heart failure) (HCC)    Hypoxia    OSA (obstructive sleep apnea) 05/09/2019   Hyperglycemia 04/09/2019   Snoring 03/13/2019   Chronic anemia 08/17/2018   Chronic cystitis 08/03/2018   Diabetic neuropathy ([place]) 06/16/2017   Non compliance w medication regimen 04/12/2017   Family history of colon cancer in mother 11/17/2016   Dyspareunia in female 11/11/2016   Screen for colon cancer 11/11/2016   History of ovarian cyst 11/11/2016   Chronic hepatitis C without hepatic coma ([place]) 11/09/2016   Hyperlipidemia 10/07/2016   Type 2 diabetes mellitus with hyperlipidemia ([place]) 01/29/2014   Status post intraocular lens implant 11/08/2013   PCO (posterior capsular opacification) 09/28/2013   Pseudophakia 09/12/2013   GERD (gastroesophageal reflux disease) 09/06/2013   Hypertension 09/06/2013   ANEMIA, IRON DEFICIENCY 10/03/2009   LIPOMA 06/18/2009   TINEA PEDIS 05/07/2009   SUBSTANCE ABUSE, MULTIPLE 02/22/2007   HCVD (hypertensive cardiovascular disease) 02/22/2007    Current Outpatient Medications:    albuterol (VENTOLIN HFA) 108 (90 Base) MCG/ACT inhaler, Inhale 1 puff into the lungs every 4 (four) hours as needed for wheezing or shortness of breath., Disp: 18 g, Rfl: 0   amLODipine (NORVASC) 2.5 MG tablet, Take 1 tablet (2.5 mg total) by mouth daily., Disp: 90 tablet, Rfl: 1   atorvastatin (LIPITOR) 40  MG tablet, Take 1 tablet (40 mg total) by mouth every morning., Disp: 90 tablet, Rfl: 1   carvedilol (COREG) 25 MG tablet, Take 1 tablet (25 mg total) by mouth 2 (two) times daily., Disp: 180 tablet, Rfl: 1   empagliflozin (JARDIANCE) 10 MG TABS tablet, Take 1 tablet (10 mg total) by mouth daily., Disp: 90 tablet, Rfl: 1   famotidine (PEPCID) 40 MG tablet, Take 1 tablet (40 mg total) by mouth daily.,  Disp: 30 tablet, Rfl: 3   ferrous sulfate 325 (65 FE) MG tablet, Take 325 mg by mouth daily with breakfast., Disp: , Rfl:    hydrALAZINE (APRESOLINE) 50 MG tablet, Take 1 tablet (50 mg total) by mouth 3 (three) times daily., Disp: 270 tablet, Rfl: 1   mometasone-formoterol (DULERA) 100-5 MCG/ACT AERO, Inhale 2 puffs into the lungs 2 (two) times daily., Disp: 1 each, Rfl: 0   pregabalin (LYRICA) 75 MG capsule, Take 1 capsule (75 mg total) by mouth 2 (two) times daily., Disp: 180 capsule, Rfl: 1   Semaglutide,0.25 or 0.'5MG'$ /DOS, (OZEMPIC, 0.25 OR 0.5 MG/DOSE,) 2 MG/1.5ML SOPN, Inject 0.5 mg into the skin once a week., Disp: 2 mL, Rfl: 6   spironolactone (ALDACTONE) 25 MG tablet, Take 0.5 tablets (12.5 mg total) by mouth daily., Disp: 30 tablet, Rfl: 3   torsemide (DEMADEX) 20 MG tablet, Take 3 tablets (60 mg total) by mouth daily., Disp: 90 tablet, Rfl: 6   nystatin ointment (MYCOSTATIN), Apply 1 application. topically 2 (two) times daily as needed (rash/skin irritation). (Patient not taking: Reported on 01/05/2022), Disp: 30 g, Rfl: 1   potassium chloride SA (KLOR-CON M) 20 MEQ tablet, Take 2 tablets (40 mEq total) by mouth once for 1 dose. (Patient not taking: Reported on 12/31/2021), Disp: 2 tablet, Rfl: 0 Allergies  Allergen Reactions   Lisinopril Swelling      Social History   Socioeconomic History   Marital status: Legally Separated    Spouse name: Not on file   Number of children: 1   Years of education: Not on file   Highest education level: 9th grade  Occupational History   Occupation: disability  Tobacco Use   Smoking status: Never   Smokeless tobacco: Never  Vaping Use   Vaping Use: Never used  Substance and Sexual Activity   Alcohol use: No   Drug use: Not Currently    Types: Cocaine, Marijuana    Comment: remote h/o cocaine 2015 and marijuana 2018   Sexual activity: Not Currently  Other Topics Concern   Not on file  Social History Narrative   Lives with friend, Rosalee Kaufman  and nephew.  One daughter.    Social Determinants of Health   Financial Resource Strain: Low Risk  (12/29/2021)   Overall Financial Resource Strain (CARDIA)    Difficulty of Paying Living Expenses: Not very hard  Food Insecurity: No Food Insecurity (11/26/2021)   Hunger Vital Sign    Worried About Running Out of Food in the Last Year: Never true    Ran Out of Food in the Last Year: Never true  Transportation Needs: No Transportation Needs (11/26/2021)   PRAPARE - Hydrologist (Medical): No    Lack of Transportation (Non-Medical): No  Recent Concern: Transportation Needs - Unmet Transportation Needs (08/31/2021)   PRAPARE - Transportation    Lack of Transportation (Medical): No    Lack of Transportation (Non-Medical): Yes  Physical Activity: Inactive (09/01/2021)   Exercise Vital Sign    Days  of Exercise per Week: 0 days    Minutes of Exercise per Session: 0 min  Stress: No Stress Concern Present (09/21/2021)   [place]    Feeling of Stress : Not at all  Social Connections: Moderately Integrated (12/29/2021)   Social Connection and Isolation Panel [NHANES]    Frequency of Communication with Friends and Family: More than three times a week    Frequency of Social Gatherings with Friends and Family: More than three times a week    Attends Religious Services: More than 4 times per year    Active Member of [employer] or Organizations: No    Attends Archivist Meetings: Never    Marital Status: Living with partner  Intimate Partner Violence: Not At Risk (09/21/2021)   Humiliation, Afraid, Rape, and Kick questionnaire    Fear of Current or Ex-Partner: No    Emotionally Abused: No    Physically Abused: No    Sexually Abused: No    Physical Exam      Future Appointments  Date Time Provider [place]  02/04/2022  2:30 PM [facility] CCC-MM CARE MANAGER 2 THN-CCC None  02/10/2022  9:10 AM  [name], MD CHW-CHWW None  03/16/2022 10:00 AM Larey Dresser, MD MC-HVSC None  03/23/2022  1:40 PM Minus Breeding, MD CVD-NORTHLIN [place], [place] [facility] Paramedic  01/20/22

## 2022-01-26 ENCOUNTER — Other Ambulatory Visit (HOSPITAL_COMMUNITY): Payer: Self-pay | Admitting: Emergency Medicine

## 2022-01-26 NOTE — Progress Notes (Signed)
Paramedicine Encounter    Patient ID: [name] [name], female    DOB: 25-Jul-1960, 61 y.o.   MRN: [id number]   BP (!) 178/98 (BP Location: Right Arm, Patient Position: Sitting, Cuff Size: Normal)   Pulse 84   Resp 16   Wt 192 lb 12.8 oz (87.5 kg)   LMP 04/01/2010   SpO2 96%   BMI 33.09 kg/m  Weight yesterday-not taken Last visit weight-193lb CBG 215  ATF Ms. [place] A&O x 4, skin warm and dry with good color.  Pt. Denies chest pain or SOB.  Lung sounds clear and equal bilat.  No edema noted.  She was one hundred % compliant with all her medications.  Her blood pressure was elevated this morning as was her blood sugar.  She has not taken her meds yet this morning because she just got up.  She also states she ate ice cream before bed last night.  She also states she had drank some of her boyfriends energy drink which she doesn't normally do.  Called in refills for [employer] and Torsemide at [employer].  No other needs at this time.  Home visit complete.  Patient Care Team: [name], MD as PCP - General (Family Medicine) Minus Breeding, MD as PCP - Cardiology (Cardiology) Craft, Lorel Monaco, RN as Case Manager [facility], Tami Lin, [place] as Physician Assistant (Cardiology)  Patient Active Problem List   Diagnosis Date Noted  . Acute on chronic heart failure with preserved ejection fraction (HFpEF) ([place]) 09/22/2021  . Heart failure ([place]) 09/22/2021  . CKD (chronic kidney disease) stage 4, GFR 15-29 ml/min (HCC) 07/03/2021  . Acute respiratory failure due to COVID-19 ([place]) 05/06/2021  . COVID-19 05/05/2021  . CHF exacerbation ([place]) 04/02/2021  . Chest pain 04/02/2021  . Acute-on-chronic kidney injury ([place]) 04/02/2021  . COPD exacerbation ([place]) 12/05/2020  . CKD (chronic kidney disease), stage III ([place]) 12/04/2020  . Anemia of chronic disease 12/04/2020  . Obesity, Class III, BMI 40-49.9 (morbid obesity) ([place]) 07/27/2020  . Acute respiratory disease due to COVID-19 virus  07/25/2020  . Acute hypoxemic respiratory failure due to COVID-19 ([place]) 07/25/2020  . Morbid obesity ([place]) 11/23/2019  . Acute respiratory failure with hypoxia ([place]) 11/06/2019  . Chronic kidney disease, stage 3b ([place]) 11/06/2019  . COPD without exacerbation ([place])   . Acute kidney failure ([place]) 08/18/2019  . Left ventricular hypertrophy 07/21/2019  . Educated about COVID-19 virus infection 07/21/2019  . Acute diastolic HF (heart failure) ([place])   . Hypoxia   . OSA (obstructive sleep apnea) 05/09/2019  . Hyperglycemia 04/09/2019  . Snoring 03/13/2019  . Chronic anemia 08/17/2018  . Chronic cystitis 08/03/2018  . Diabetic neuropathy ([place]) 06/16/2017  . Non compliance w medication regimen 04/12/2017  . Family history of colon cancer in mother 11/17/2016  . Dyspareunia in female 11/11/2016  . Screen for colon cancer 11/11/2016  . History of ovarian cyst 11/11/2016  . Chronic hepatitis C without hepatic coma ([place]) 11/09/2016  . Hyperlipidemia 10/07/2016  . Type 2 diabetes mellitus with hyperlipidemia ([place]) 01/29/2014  . Status post intraocular lens implant 11/08/2013  . PCO (posterior capsular opacification) 09/28/2013  . Pseudophakia 09/12/2013  . GERD (gastroesophageal reflux disease) 09/06/2013  . Hypertension 09/06/2013  . ANEMIA, IRON DEFICIENCY 10/03/2009  . LIPOMA 06/18/2009  . TINEA PEDIS 05/07/2009  . SUBSTANCE ABUSE, MULTIPLE 02/22/2007  . HCVD (hypertensive cardiovascular disease) 02/22/2007    Current Outpatient Medications:  .  albuterol (VENTOLIN HFA) 108 (90 Base) MCG/ACT inhaler, Inhale  1 puff into the lungs every 4 (four) hours as needed for wheezing or shortness of breath., Disp: 18 g, Rfl: 0 .  amLODipine (NORVASC) 2.5 MG tablet, Take 1 tablet (2.5 mg total) by mouth daily., Disp: 90 tablet, Rfl: 1 .  atorvastatin (LIPITOR) 40 MG tablet, Take 1 tablet (40 mg total) by mouth every morning., Disp: 90 tablet, Rfl: 1 .  carvedilol (COREG) 25 MG tablet, Take 1 tablet  (25 mg total) by mouth 2 (two) times daily., Disp: 180 tablet, Rfl: 1 .  empagliflozin (JARDIANCE) 10 MG TABS tablet, Take 1 tablet (10 mg total) by mouth daily., Disp: 90 tablet, Rfl: 1 .  famotidine (PEPCID) 40 MG tablet, Take 1 tablet (40 mg total) by mouth daily., Disp: 30 tablet, Rfl: 3 .  ferrous sulfate 325 (65 FE) MG tablet, Take 325 mg by mouth daily with breakfast., Disp: , Rfl:  .  hydrALAZINE (APRESOLINE) 50 MG tablet, Take 1 tablet (50 mg total) by mouth 3 (three) times daily., Disp: 270 tablet, Rfl: 1 .  mometasone-formoterol (DULERA) 100-5 MCG/ACT AERO, Inhale 2 puffs into the lungs 2 (two) times daily., Disp: 1 each, Rfl: 0 .  nystatin ointment (MYCOSTATIN), Apply 1 application. topically 2 (two) times daily as needed (rash/skin irritation). (Patient not taking: Reported on 01/05/2022), Disp: 30 g, Rfl: 1 .  potassium chloride SA (KLOR-CON M) 20 MEQ tablet, Take 2 tablets (40 mEq total) by mouth once for 1 dose. (Patient not taking: Reported on 12/31/2021), Disp: 2 tablet, Rfl: 0 .  pregabalin (LYRICA) 75 MG capsule, Take 1 capsule (75 mg total) by mouth 2 (two) times daily., Disp: 180 capsule, Rfl: 1 .  Semaglutide,0.25 or 0.'5MG'$ /DOS, (OZEMPIC, 0.25 OR 0.5 MG/DOSE,) 2 MG/1.5ML SOPN, Inject 0.5 mg into the skin once a week., Disp: 2 mL, Rfl: 6 .  spironolactone (ALDACTONE) 25 MG tablet, Take 0.5 tablets (12.5 mg total) by mouth daily., Disp: 30 tablet, Rfl: 3 .  torsemide (DEMADEX) 20 MG tablet, Take 3 tablets (60 mg total) by mouth daily., Disp: 90 tablet, Rfl: 6 Allergies  Allergen Reactions  . Lisinopril Swelling      Social History   Socioeconomic History  . Marital status: Legally Separated    Spouse name: Not on file  . Number of children: 1  . Years of education: Not on file  . Highest education level: 9th grade  Occupational History  . Occupation: disability  Tobacco Use  . Smoking status: Never  . Smokeless tobacco: Never  Vaping Use  . Vaping Use: Never used   Substance and Sexual Activity  . Alcohol use: No  . Drug use: Not Currently    Types: Cocaine, Marijuana    Comment: remote h/o cocaine 2015 and marijuana 2018  . Sexual activity: Not Currently  Other Topics Concern  . Not on file  Social History Narrative   Lives with friend, Rosalee Kaufman and nephew.  One daughter.    Social Determinants of Health   Financial Resource Strain: Low Risk  (12/29/2021)   Overall Financial Resource Strain (CARDIA)   . Difficulty of Paying Living Expenses: Not very hard  Food Insecurity: No Food Insecurity (11/26/2021)   Hunger Vital Sign   . Worried About Charity fundraiser in the Last Year: Never true   . Ran Out of Food in the Last Year: Never true  Transportation Needs: No Transportation Needs (11/26/2021)   PRAPARE - Transportation   . Lack of Transportation (Medical): No   . Lack of  Transportation (Non-Medical): No  Recent Concern: Transportation Needs - Unmet Transportation Needs (08/31/2021)   PRAPARE - Transportation   . Lack of Transportation (Medical): No   . Lack of Transportation (Non-Medical): Yes  Physical Activity: Inactive (09/01/2021)   Exercise Vital Sign   . Days of Exercise per Week: 0 days   . Minutes of Exercise per Session: 0 min  Stress: No Stress Concern Present (09/21/2021)   [place]   . Feeling of Stress : Not at all  Social Connections: Moderately Integrated (12/29/2021)   Social Connection and Isolation Panel [NHANES]   . Frequency of Communication with Friends and Family: More than three times a week   . Frequency of Social Gatherings with Friends and Family: More than three times a week   . Attends Religious Services: More than 4 times per year   . Active Member of Clubs or Organizations: No   . Attends Archivist Meetings: Never   . Marital Status: Living with partner  Intimate Partner Violence: Not At Risk (09/21/2021)   Humiliation, Afraid,  Rape, and Kick questionnaire   . Fear of Current or Ex-Partner: No   . Emotionally Abused: No   . Physically Abused: No   . Sexually Abused: No    Physical Exam      Future Appointments  Date Time Provider [place]  02/04/2022  2:30 PM [facility] CCC-MM CARE MANAGER 2 THN-CCC None  02/10/2022  9:10 AM [name], MD CHW-CHWW None  03/16/2022 10:00 AM Larey Dresser, MD MC-HVSC None  03/23/2022  1:40 PM Minus Breeding, MD CVD-NORTHLIN [place], [place] [facility] Paramedic  01/26/22

## 2022-02-02 ENCOUNTER — Other Ambulatory Visit (HOSPITAL_COMMUNITY): Payer: Self-pay | Admitting: Emergency Medicine

## 2022-02-02 NOTE — Progress Notes (Signed)
Paramedicine Encounter    Patient ID: [name] [name], female    DOB: [birth year], 61 y.o.   MRN: [id number]   LMP 04/01/2010  Weight yesterday-did not weigh Last visit weight-189.2lbs.  ATF [name] A&O x 4, skin W&D w/ good color.  She denies chest pain or SOB.  No chest pain or SOB.  No edema or JVD Lung sounds clear and equal bilat.  Pt was 100% compliant taking her medications. Meds reconciled.  Home visit complete.     Patient Care Team: [name], MD as PCP - General (Family Medicine) Minus Breeding, MD as PCP - Cardiology (Cardiology) Craft, Lorel Monaco, RN as Case Manager [facility], Tami Lin, [place] as Physician Assistant (Cardiology)  Patient Active Problem List   Diagnosis Date Noted   Acute on chronic heart failure with preserved ejection fraction (HFpEF) ([place]) 09/22/2021   Heart failure ([place]) 09/22/2021   CKD (chronic kidney disease) stage 4, GFR 15-29 ml/min ([place]) 07/03/2021   Acute respiratory failure due to COVID-19 ([place]) 05/06/2021   COVID-19 05/05/2021   CHF exacerbation ([place]) 04/02/2021   Chest pain 04/02/2021   Acute-on-chronic kidney injury ([place]) 04/02/2021   COPD exacerbation ([place]) 12/05/2020   CKD (chronic kidney disease), stage III ([place]) 12/04/2020   Anemia of chronic disease 12/04/2020   Obesity, Class III, BMI 40-49.9 (morbid obesity) ([place]) 07/27/2020   Acute respiratory disease due to COVID-19 virus 07/25/2020   Acute hypoxemic respiratory failure due to COVID-19 ([place]) 07/25/2020   Morbid obesity ([place]) 11/23/2019   Acute respiratory failure with hypoxia ([place]) 11/06/2019   Chronic kidney disease, stage 3b ([place]) 11/06/2019   COPD without exacerbation ([place])    Acute kidney failure ([place]) 08/18/2019   Left ventricular hypertrophy 07/21/2019   Educated about COVID-19 virus infection 76/16/0737   Acute diastolic HF (heart failure) (HCC)    Hypoxia    OSA (obstructive sleep apnea) 05/09/2019   Hyperglycemia 04/09/2019   Snoring 03/13/2019   Chronic  anemia 08/17/2018   Chronic cystitis 08/03/2018   Diabetic neuropathy ([place]) 06/16/2017   Non compliance w medication regimen 04/12/2017   Family history of colon cancer in mother 11/17/2016   Dyspareunia in female 11/11/2016   Screen for colon cancer 11/11/2016   History of ovarian cyst 11/11/2016   Chronic hepatitis C without hepatic coma ([place]) 11/09/2016   Hyperlipidemia 10/07/2016   Type 2 diabetes mellitus with hyperlipidemia ([place]) 01/29/2014   Status post intraocular lens implant 11/08/2013   PCO (posterior capsular opacification) 09/28/2013   Pseudophakia 09/12/2013   GERD (gastroesophageal reflux disease) 09/06/2013   Hypertension 09/06/2013   ANEMIA, IRON DEFICIENCY 10/03/2009   LIPOMA 06/18/2009   TINEA PEDIS 05/07/2009   SUBSTANCE ABUSE, MULTIPLE 02/22/2007   HCVD (hypertensive cardiovascular disease) 02/22/2007    Current Outpatient Medications:    albuterol (VENTOLIN HFA) 108 (90 Base) MCG/ACT inhaler, Inhale 1 puff into the lungs every 4 (four) hours as needed for wheezing or shortness of breath., Disp: 18 g, Rfl: 0   amLODipine (NORVASC) 2.5 MG tablet, Take 1 tablet (2.5 mg total) by mouth daily., Disp: 90 tablet, Rfl: 1   atorvastatin (LIPITOR) 40 MG tablet, Take 1 tablet (40 mg total) by mouth every morning., Disp: 90 tablet, Rfl: 1   carvedilol (COREG) 25 MG tablet, Take 1 tablet (25 mg total) by mouth 2 (two) times daily., Disp: 180 tablet, Rfl: 1   empagliflozin (JARDIANCE) 10 MG TABS tablet, Take 1 tablet (10 mg total) by mouth daily., Disp: 90 tablet, Rfl: 1  famotidine (PEPCID) 40 MG tablet, Take 1 tablet (40 mg total) by mouth daily., Disp: 30 tablet, Rfl: 3   ferrous sulfate 325 (65 FE) MG tablet, Take 325 mg by mouth daily with breakfast., Disp: , Rfl:    hydrALAZINE (APRESOLINE) 50 MG tablet, Take 1 tablet (50 mg total) by mouth 3 (three) times daily., Disp: 270 tablet, Rfl: 1   mometasone-formoterol (DULERA) 100-5 MCG/ACT AERO, Inhale 2 puffs into the lungs  2 (two) times daily., Disp: 1 each, Rfl: 0   pregabalin (LYRICA) 75 MG capsule, Take 1 capsule (75 mg total) by mouth 2 (two) times daily., Disp: 180 capsule, Rfl: 1   Semaglutide,0.25 or 0.'5MG'$ /DOS, (OZEMPIC, 0.25 OR 0.5 MG/DOSE,) 2 MG/1.5ML SOPN, Inject 0.5 mg into the skin once a week., Disp: 2 mL, Rfl: 6   spironolactone (ALDACTONE) 25 MG tablet, Take 0.5 tablets (12.5 mg total) by mouth daily., Disp: 30 tablet, Rfl: 3   torsemide (DEMADEX) 20 MG tablet, Take 3 tablets (60 mg total) by mouth daily., Disp: 90 tablet, Rfl: 6   nystatin ointment (MYCOSTATIN), Apply 1 application. topically 2 (two) times daily as needed (rash/skin irritation). (Patient not taking: Reported on 01/05/2022), Disp: 30 g, Rfl: 1   potassium chloride SA (KLOR-CON M) 20 MEQ tablet, Take 2 tablets (40 mEq total) by mouth once for 1 dose. (Patient not taking: Reported on 12/31/2021), Disp: 2 tablet, Rfl: 0 Allergies  Allergen Reactions   Lisinopril Swelling      Social History   Socioeconomic History   Marital status: Legally Separated    Spouse name: Not on file   Number of children: 1   Years of education: Not on file   Highest education level: 9th grade  Occupational History   Occupation: disability  Tobacco Use   Smoking status: Never   Smokeless tobacco: Never  Vaping Use   Vaping Use: Never used  Substance and Sexual Activity   Alcohol use: No   Drug use: Not Currently    Types: Cocaine, Marijuana    Comment: remote h/o cocaine 2015 and marijuana 2018   Sexual activity: Not Currently  Other Topics Concern   Not on file  Social History Narrative   Lives with friend, Rosalee Kaufman and nephew.  One daughter.    Social Determinants of Health   Financial Resource Strain: Low Risk  (12/29/2021)   Overall Financial Resource Strain (CARDIA)    Difficulty of Paying Living Expenses: Not very hard  Food Insecurity: No Food Insecurity (11/26/2021)   Hunger Vital Sign    Worried About Running Out of Food in the  Last Year: Never true    Ran Out of Food in the Last Year: Never true  Transportation Needs: No Transportation Needs (11/26/2021)   PRAPARE - Hydrologist (Medical): No    Lack of Transportation (Non-Medical): No  Recent Concern: Transportation Needs - Unmet Transportation Needs (08/31/2021)   PRAPARE - Transportation    Lack of Transportation (Medical): No    Lack of Transportation (Non-Medical): Yes  Physical Activity: Inactive (09/01/2021)   Exercise Vital Sign    Days of Exercise per Week: 0 days    Minutes of Exercise per Session: 0 min  Stress: No Stress Concern Present (09/21/2021)   [place]    Feeling of Stress : Not at all  Social Connections: Moderately Integrated (12/29/2021)   Social Connection and Isolation Panel [NHANES]    Frequency of Communication with  Friends and Family: More than three times a week    Frequency of Social Gatherings with Friends and Family: More than three times a week    Attends Religious Services: More than 4 times per year    Active Member of Clubs or Organizations: No    Attends Archivist Meetings: Never    Marital Status: Living with partner  Intimate Partner Violence: Not At Risk (09/21/2021)   Humiliation, Afraid, Rape, and Kick questionnaire    Fear of Current or Ex-Partner: No    Emotionally Abused: No    Physically Abused: No    Sexually Abused: No    Physical Exam      Future Appointments  Date Time Provider [place]  02/04/2022  2:30 PM [facility] CCC-MM CARE MANAGER 2 THN-CCC None  02/10/2022  9:10 AM [name], MD CHW-CHWW None  03/16/2022 10:00 AM Larey Dresser, MD MC-HVSC None  03/23/2022  1:40 PM Minus Breeding, MD CVD-NORTHLIN [place], [place] [facility] Paramedic  02/02/22

## 2022-02-04 ENCOUNTER — Other Ambulatory Visit: Payer: Self-pay | Admitting: Obstetrics and Gynecology

## 2022-02-04 NOTE — Patient Instructions (Signed)
Hi [name] [name] belated [name] [name] to you!!  Have a great afternoon!!  [name] [name] was given information about Medicaid Managed Care team care coordination services as a part of their [place] Medicaid benefit. [name] [name] verbally consented to engagement with the [facility] Managed Care team.   If you are experiencing a medical emergency, please call 911 or report to your local emergency department or urgent care.   If you have a non-emergency medical problem during routine business hours, please contact your provider's office and ask to speak with a nurse.   For questions related to your [facility], please call: [phone] or visit the homepage here: [email or web address]  If you would like to schedule transportation through your [facility], please call the following number at least 2 days in advance of your appointment: [phone]   Rides for urgent appointments can also be made after hours by calling Member Services.  Call the [place] at [phone], at any time, 24 hours a day, 7 days a week. If you are in danger or need immediate medical attention call 911.  If you would like help to quit smoking, call 1-800-QUIT-NOW 3235520038) OR Espaol: 1-855-Djelo-Ya (3-295-188-4166) o para ms informacin haga clic aqu or Text READY to 200-400 to register via text  [name] [name] - following are the goals we discussed in your visit today:   Goals Addressed             This Visit's Progress    Protect My Health       Timeframe:  Long-Range Goal Priority:  Medium Start Date:          10/29/20                   Expected End Date: ongoing             Follow Up Date: 03/07/22   - schedule appointment for flu shot - schedule appointment for vaccines needed due to my age or health - schedule recommended health tests (blood work,  mammogram, colonoscopy, pap test) - schedule and keep appointment for annual check-up   02/04/22:   Patient being followed by Paramedicine weekly, PCP 7/26, eye provider 8/2    Why is this important?   Screening tests can find diseases early when they are easier to treat.  Your doctor or nurse will talk with you about which tests are important for you.  Getting shots for common diseases like the flu and shingles will help prevent them.     Patient verbalizes understanding of instructions and care plan provided today and agrees to view in [place]. Active MyChart status and patient understanding of how to access instructions and care plan via MyChart confirmed with patient.     The Managed Medicaid care management team will reach out to the patient again over the next 30 days.  The  Patient  has been provided with contact information for the Managed Medicaid care management team and has been advised to call with any health related questions or concerns.   [name] RN, BSN [place] Management Coordinator - Managed Medicaid High Risk [phone]   Following is a copy of your plan of care:  Care Plan : General Plan of Care (Adult)  Updates made by Gayla Medicus, RN since 02/04/2022 12:00 AM     Problem: Quality of Life (General Plan of Care)   Priority: High  Onset Date: 10/29/2020     Long-Range Goal: Quality of Life Maintained   Start Date: 10/29/2020  Expected End Date: 05/07/2022  Recent Progress: Not on track  Priority: High  Note:    CARE PLAN ENTRY Medicaid Managed Care (see longitudinal plan of care for additional care plan information)  Current Barriers:  Chronic case management needs related to chronic health conditions. 02/04/22:  Patient complains of fecal incontinence today, wears Depends undergarment-has been occurring for 2-3 months and medicaine not helping-no other complaints today.  Paramedicine continues to see patient every  week-weight-189.  Upcoming PCP appt 7/26 and eye provider 8/2.    Nurse Case Manager Clinical Goal(s):  Over the next 30 days, patient will verbalize understanding of plan for decreased swelling.  Over the next 30 days, patient will work with provider to address needs  Over the next 30 days, patient will continue to work with Pharmacist. Over the next 30 days, patient will attend all scheduled appointments.  Interventions:  Inter-disciplinary care team collaboration (see longitudinal plan of care) Reviewed medications with patient. Discussed plans with patient for ongoing care management follow up and provided patient with direct contact information for care management team. Collaboration with Pharmacist for review of medications. Pharmacy referral for medication review. Collaborated with BSW for resources. BSW  Referral for resources-completed Collaborated with PCP for GI referral at patient request  Plan:  Patient will follow up with provider and fill all prescriptions RNCM will follow up with patient within 30 days.

## 2022-02-04 NOTE — Patient Outreach (Signed)
Medicaid Managed Care   Nurse Care Manager Note  02/04/2022 Name:  [name] [name] MRN:  [id number] DOB:  [birth year]  [name] [name] is an 61 y.o. year old female who is a primary patient of [name], MD.  The [facility] Managed Care Coordination team was consulted for assistance with:    Chronic healthcare management needs, GERD, DM, HTN, COPD, OSA, CKD, CHF, HLD  [name] [name] was given information about Medicaid Managed Care Coordination team services today. [name] [name] Patient agreed to services and verbal consent obtained.  Engaged with patient by telephone for follow up visit in response to provider referral for case management and/or care coordination services.   Assessments/Interventions:  Review of past medical history, allergies, medications, health status, including review of consultants reports, laboratory and other test data, was performed as part of comprehensive evaluation and provision of chronic care management services.  SDOH (Social Determinants of Health) assessments and interventions performed: SDOH Interventions    Flowsheet Row Most Recent Value  SDOH Interventions   Physical Activity Interventions Other (Comments)  [patient not physically able to engage in a moderate to strenuous exercise]      Care Plan  Allergies  Allergen Reactions   Lisinopril Swelling    Medications Reviewed Today     Reviewed by Gayla Medicus, RN (Registered Nurse) on 02/04/22 at 27  Med List Status: <None>   Medication Order Taking? Sig Documenting Provider Last Dose Status Informant  albuterol (VENTOLIN HFA) 108 (90 Base) MCG/ACT inhaler [id number] No Inhale 1 puff into the lungs every 4 (four) hours as needed for wheezing or shortness of breath. [name], MD Taking Active Self, Spouse/Significant Other           Med Note [name] [name], [name]   Tue Jan 05, 2022 11:14 AM) As needed  amLODipine (NORVASC) 2.5 MG tablet [id number] No Take 1 tablet (2.5 mg total) by mouth  daily. [name], MD Taking Active   atorvastatin (LIPITOR) 40 MG tablet [id number] No Take 1 tablet (40 mg total) by mouth every morning. [name], MD Taking Active   carvedilol (COREG) 25 MG tablet [id number] No Take 1 tablet (25 mg total) by mouth 2 (two) times daily. Consuelo Pandy, PA-C Taking Active            Med Note [name] [name], [place] Dec 22, 2021 11:16 AM) Now agrees to take Carvedilol twice a day as prescribed.  empagliflozin (JARDIANCE) 10 MG TABS tablet [id number] No Take 1 tablet (10 mg total) by mouth daily. [name], MD Taking Active   famotidine (PEPCID) 40 MG tablet [id number] No Take 1 tablet (40 mg total) by mouth daily. Ledora Bottcher, PA Taking Active   ferrous sulfate 325 (65 FE) MG tablet [id number] No Take 325 mg by mouth daily with breakfast. [provider] Taking Active   hydrALAZINE (APRESOLINE) 50 MG tablet [id number] No Take 1 tablet (50 mg total) by mouth 3 (three) times daily. [name], MD Taking Active   mometasone-formoterol [facility]) 100-5 MCG/ACT AERO [id number] No Inhale 2 puffs into the lungs 2 (two) times daily. Loletha Grayer, MD Taking Active Self, Spouse/Significant Other           Med Note [name] [name], [name]   Tue Dec 22, 2021 11:22 AM) As needed  nystatin ointment (MYCOSTATIN) [id number] No Apply 1 application. topically 2 (two) times daily as needed (rash/skin irritation).  Patient not taking: Reported on 01/05/2022   [name], MD Not Taking Active  Med Note [name] [name], [name] [name]   Thu Dec 31, 2021 11:18 AM)    potassium chloride SA (KLOR-CON M) 20 MEQ tablet [id number] No Take 2 tablets (40 mEq total) by mouth once for 1 dose.  Patient not taking: Reported on 12/31/2021   [name] Bihari, FNP Not Taking Expired 12/21/21 2359   pregabalin (LYRICA) 75 MG capsule [id number] No Take 1 capsule (75 mg total) by mouth 2 (two) times daily. [name], MD Taking Active   Semaglutide,0.25 or 0.'5MG'$ /DOS,  (OZEMPIC, 0.25 OR 0.5 MG/DOSE,) 2 MG/1.5ML SOPN [id number] No Inject 0.5 mg into the skin once a week. [name], MD Taking Active            Med Note [name] [name], [name]   Tue Feb 02, 2022 11:15 AM)    spironolactone (ALDACTONE) 25 MG tablet [id number] No Take 0.5 tablets (12.5 mg total) by mouth daily. Foster, Maricela Bo, FNP Taking Active   torsemide (DEMADEX) 20 MG tablet [id number] No Take 3 tablets (60 mg total) by mouth daily. [name] [name], [place] Taking Active            Patient Active Problem List   Diagnosis Date Noted   Acute on chronic heart failure with preserved ejection fraction (HFpEF) ([place]) 09/22/2021   Heart failure ([place]) 09/22/2021   CKD (chronic kidney disease) stage 4, GFR 15-29 ml/min ([place]) 07/03/2021   Acute respiratory failure due to COVID-19 ([place]) 05/06/2021   COVID-19 05/05/2021   CHF exacerbation ([place]) 04/02/2021   Chest pain 04/02/2021   Acute-on-chronic kidney injury ([place]) 04/02/2021   COPD exacerbation ([place]) 12/05/2020   CKD (chronic kidney disease), stage III ([place]) 12/04/2020   Anemia of chronic disease 12/04/2020   Obesity, Class III, BMI 40-49.9 (morbid obesity) ([place]) 07/27/2020   Acute respiratory disease due to COVID-19 virus 07/25/2020   Acute hypoxemic respiratory failure due to COVID-19 ([place]) 07/25/2020   Morbid obesity ([place]) 11/23/2019   Acute respiratory failure with hypoxia ([place]) 11/06/2019   Chronic kidney disease, stage 3b ([place]) 11/06/2019   COPD without exacerbation ([place])    Acute kidney failure ([place]) 08/18/2019   Left ventricular hypertrophy 07/21/2019   Educated about COVID-19 virus infection 56/25/6389   Acute diastolic HF (heart failure) (HCC)    Hypoxia    OSA (obstructive sleep apnea) 05/09/2019   Hyperglycemia 04/09/2019   Snoring 03/13/2019   Chronic anemia 08/17/2018   Chronic cystitis 08/03/2018   Diabetic neuropathy ([place]) 06/16/2017   Non compliance w medication regimen 04/12/2017   Family history of colon cancer in  mother 11/17/2016   Dyspareunia in female 11/11/2016   Screen for colon cancer 11/11/2016   History of ovarian cyst 11/11/2016   Chronic hepatitis C without hepatic coma ([place]) 11/09/2016   Hyperlipidemia 10/07/2016   Type 2 diabetes mellitus with hyperlipidemia ([place]) 01/29/2014   Status post intraocular lens implant 11/08/2013   PCO (posterior capsular opacification) 09/28/2013   Pseudophakia 09/12/2013   GERD (gastroesophageal reflux disease) 09/06/2013   Hypertension 09/06/2013   ANEMIA, IRON DEFICIENCY 10/03/2009   LIPOMA 06/18/2009   TINEA PEDIS 05/07/2009   SUBSTANCE ABUSE, MULTIPLE 02/22/2007   HCVD (hypertensive cardiovascular disease) 02/22/2007   Conditions to be addressed/monitored per PCP order:  Chronic healthcare management needs, GERD, DM, HTN, COPD, OSA, CKD, CHF, HLD  Care Plan : General Plan of Care (Adult)  Updates made by Gayla Medicus, RN since 02/04/2022 12:00 AM     Problem: Quality of Life (General Plan of Care)   Priority: High  Onset Date: 10/29/2020     Long-Range Goal: Quality of Life Maintained   Start Date: 10/29/2020  Expected End Date: 05/07/2022  Recent Progress: Not on track  Priority: High  Note:    CARE PLAN ENTRY Medicaid Managed Care (see longitudinal plan of care for additional care plan information)  Current Barriers:  Chronic case management needs related to chronic health conditions. 02/04/22:  Patient complains of fecal incontinence today, wears Depends undergarment-has been occurring for 2-3 months and medicaine not helping-no other complaints today.  Paramedicine continues to see patient every week-weight-189.  Upcoming PCP appt 7/26 and eye provider 8/2.    Nurse Case Manager Clinical Goal(s):  Over the next 30 days, patient will verbalize understanding of plan for decreased swelling.  Over the next 30 days, patient will work with provider to address needs  Over the next 30 days, patient will continue to work with  Pharmacist. Over the next 30 days, patient will attend all scheduled appointments.  Interventions:  Inter-disciplinary care team collaboration (see longitudinal plan of care) Reviewed medications with patient. Discussed plans with patient for ongoing care management follow up and provided patient with direct contact information for care management team. Collaboration with Pharmacist for review of medications. Pharmacy referral for medication review. Collaborated with BSW for resources. BSW  Referral for resources-completed Collaborated with PCP for GI referral at patient request  Plan:  Patient will follow up with provider and fill all prescriptions RNCM will follow up with patient within 30 days.   Follow Up:  Patient agrees to Care Plan and Follow-up.  Plan: The Managed Medicaid care management team will reach out to the patient again over the next 30 days. and The  Patient has been provided with contact information for the Managed Medicaid care management team and has been advised to call with any health related questions or concerns.  Date/time of next scheduled RN care management/care coordination outreach:  03/03/22 at 315.

## 2022-02-09 ENCOUNTER — Other Ambulatory Visit (HOSPITAL_COMMUNITY): Payer: Self-pay | Admitting: Emergency Medicine

## 2022-02-09 NOTE — Progress Notes (Signed)
Paramedicine Encounter    Patient ID: [name] [name], female    DOB: July 15, 1961, 61 y.o.   MRN: [id number]   LMP 04/01/2010  Weight yesterday-not taken Last visit weight-189lb CBG 283   Ms. Mundo A&Ox 4, skin W&D w/ good color.  Pt. Reports to be feeling well.  No chest pain or SOB.  No edema and lung sounds clear and equal bilat.  She is 100% compliant with her meds. BP up this morning but she states she just took her meds before I arrived. Pt's sugar was 283.  She was drinking lemondade when I got here.  We discussed cutting out the Lemonade and soft drinks and other sugary snacks.   Med box reconciled.  Appointment w/ Dr. Margarita Rana tomorrow.  Home visit complete.   Patient Care Team: [name], MD as PCP - General (Family Medicine) Minus Breeding, MD as PCP - Cardiology (Cardiology) Craft, Lorel Monaco, RN as Case Manager [place], Tami Lin, [place] as Physician Assistant (Cardiology)  Patient Active Problem List   Diagnosis Date Noted   Acute on chronic heart failure with preserved ejection fraction (HFpEF) ([place]) 09/22/2021   Heart failure ([place]) 09/22/2021   CKD (chronic kidney disease) stage 4, GFR 15-29 ml/min ([place]) 07/03/2021   Acute respiratory failure due to COVID-19 ([place]) 05/06/2021   COVID-19 05/05/2021   CHF exacerbation ([place]) 04/02/2021   Chest pain 04/02/2021   Acute-on-chronic kidney injury ([place]) 04/02/2021   COPD exacerbation ([place]) 12/05/2020   CKD (chronic kidney disease), stage III ([place]) 12/04/2020   Anemia of chronic disease 12/04/2020   Obesity, Class III, BMI 40-49.9 (morbid obesity) ([place]) 07/27/2020   Acute respiratory disease due to COVID-19 virus 07/25/2020   Acute hypoxemic respiratory failure due to COVID-19 ([place]) 07/25/2020   Morbid obesity ([place]) 11/23/2019   Acute respiratory failure with hypoxia ([place]) 11/06/2019   Chronic kidney disease, stage 3b ([place]) 11/06/2019   COPD without exacerbation ([place])    Acute kidney failure ([place]) 08/18/2019   Left  ventricular hypertrophy 07/21/2019   Educated about COVID-19 virus infection 23/55/7322   Acute diastolic HF (heart failure) (HCC)    Hypoxia    OSA (obstructive sleep apnea) 05/09/2019   Hyperglycemia 04/09/2019   Snoring 03/13/2019   Chronic anemia 08/17/2018   Chronic cystitis 08/03/2018   Diabetic neuropathy ([place]) 06/16/2017   Non compliance w medication regimen 04/12/2017   Family history of colon cancer in mother 11/17/2016   Dyspareunia in female 11/11/2016   Screen for colon cancer 11/11/2016   History of ovarian cyst 11/11/2016   Chronic hepatitis C without hepatic coma ([place]) 11/09/2016   Hyperlipidemia 10/07/2016   Type 2 diabetes mellitus with hyperlipidemia ([place]) 01/29/2014   Status post intraocular lens implant 11/08/2013   PCO (posterior capsular opacification) 09/28/2013   Pseudophakia 09/12/2013   GERD (gastroesophageal reflux disease) 09/06/2013   Hypertension 09/06/2013   ANEMIA, IRON DEFICIENCY 10/03/2009   LIPOMA 06/18/2009   TINEA PEDIS 05/07/2009   SUBSTANCE ABUSE, MULTIPLE 02/22/2007   HCVD (hypertensive cardiovascular disease) 02/22/2007    Current Outpatient Medications:    albuterol (VENTOLIN HFA) 108 (90 Base) MCG/ACT inhaler, Inhale 1 puff into the lungs every 4 (four) hours as needed for wheezing or shortness of breath., Disp: 18 g, Rfl: 0   amLODipine (NORVASC) 2.5 MG tablet, Take 1 tablet (2.5 mg total) by mouth daily., Disp: 90 tablet, Rfl: 1   atorvastatin (LIPITOR) 40 MG tablet, Take 1 tablet (40 mg total) by mouth every morning., Disp: 90 tablet, Rfl:  1   carvedilol (COREG) 25 MG tablet, Take 1 tablet (25 mg total) by mouth 2 (two) times daily., Disp: 180 tablet, Rfl: 1   empagliflozin (JARDIANCE) 10 MG TABS tablet, Take 1 tablet (10 mg total) by mouth daily., Disp: 90 tablet, Rfl: 1   famotidine (PEPCID) 40 MG tablet, Take 1 tablet (40 mg total) by mouth daily., Disp: 30 tablet, Rfl: 3   ferrous sulfate 325 (65 FE) MG tablet, Take 325 mg by  mouth daily with breakfast., Disp: , Rfl:    hydrALAZINE (APRESOLINE) 50 MG tablet, Take 1 tablet (50 mg total) by mouth 3 (three) times daily., Disp: 270 tablet, Rfl: 1   mometasone-formoterol (DULERA) 100-5 MCG/ACT AERO, Inhale 2 puffs into the lungs 2 (two) times daily., Disp: 1 each, Rfl: 0   nystatin ointment (MYCOSTATIN), Apply 1 application. topically 2 (two) times daily as needed (rash/skin irritation). (Patient not taking: Reported on 01/05/2022), Disp: 30 g, Rfl: 1   potassium chloride SA (KLOR-CON M) 20 MEQ tablet, Take 2 tablets (40 mEq total) by mouth once for 1 dose. (Patient not taking: Reported on 12/31/2021), Disp: 2 tablet, Rfl: 0   pregabalin (LYRICA) 75 MG capsule, Take 1 capsule (75 mg total) by mouth 2 (two) times daily., Disp: 180 capsule, Rfl: 1   Semaglutide,0.25 or 0.'5MG'$ /DOS, (OZEMPIC, 0.25 OR 0.5 MG/DOSE,) 2 MG/1.5ML SOPN, Inject 0.5 mg into the skin once a week., Disp: 2 mL, Rfl: 6   spironolactone (ALDACTONE) 25 MG tablet, Take 0.5 tablets (12.5 mg total) by mouth daily., Disp: 30 tablet, Rfl: 3   torsemide (DEMADEX) 20 MG tablet, Take 3 tablets (60 mg total) by mouth daily., Disp: 90 tablet, Rfl: 6 Allergies  Allergen Reactions   Lisinopril Swelling      Social History   Socioeconomic History   Marital status: Legally Separated    Spouse name: Not on file   Number of children: 1   Years of education: Not on file   Highest education level: 9th grade  Occupational History   Occupation: disability  Tobacco Use   Smoking status: Never   Smokeless tobacco: Never  Vaping Use   Vaping Use: Never used  Substance and Sexual Activity   Alcohol use: No   Drug use: Not Currently    Types: Cocaine, Marijuana    Comment: remote h/o cocaine 2015 and marijuana 2018   Sexual activity: Not Currently  Other Topics Concern   Not on file  Social History Narrative   Lives with friend, Rosalee Kaufman and nephew.  One daughter.    Social Determinants of Health   Financial  Resource Strain: Low Risk  (12/29/2021)   Overall Financial Resource Strain (CARDIA)    Difficulty of Paying Living Expenses: Not very hard  Food Insecurity: No Food Insecurity (11/26/2021)   Hunger Vital Sign    Worried About Running Out of Food in the Last Year: Never true    Ran Out of Food in the Last Year: Never true  Transportation Needs: No Transportation Needs (11/26/2021)   PRAPARE - Hydrologist (Medical): No    Lack of Transportation (Non-Medical): No  Recent Concern: Transportation Needs - Unmet Transportation Needs (08/31/2021)   PRAPARE - Transportation    Lack of Transportation (Medical): No    Lack of Transportation (Non-Medical): Yes  Physical Activity: Insufficiently Active (02/04/2022)   Exercise Vital Sign    Days of Exercise per Week: 1 day    Minutes of Exercise per Session: 30  min  Stress: No Stress Concern Present (09/21/2021)   [place]    Feeling of Stress : Not at all  Social Connections: Moderately Integrated (12/29/2021)   Social Connection and Isolation Panel [NHANES]    Frequency of Communication with Friends and Family: More than three times a week    Frequency of Social Gatherings with Friends and Family: More than three times a week    Attends Religious Services: More than 4 times per year    Active Member of [employer] or Organizations: No    Attends Archivist Meetings: Never    Marital Status: Living with partner  Intimate Partner Violence: Not At Risk (02/04/2022)   Humiliation, Afraid, Rape, and Kick questionnaire    Fear of Current or Ex-Partner: No    Emotionally Abused: No    Physically Abused: No    Sexually Abused: No    Physical Exam      Future Appointments  Date Time Provider [place]  02/10/2022  9:10 AM [name], MD [facility] None  03/03/2022  3:15 PM [facility] CCC-MM CARE MANAGER 2 [facility] None  03/16/2022 10:00 AM Larey Dresser, MD [facility] None  03/23/2022  1:40 PM Minus Breeding, MD [facility] [place], [facility] Paramedic  02/09/22

## 2022-02-10 ENCOUNTER — Encounter: Payer: Self-pay | Admitting: Family Medicine

## 2022-02-10 ENCOUNTER — Ambulatory Visit: Payer: Medicare Other | Attending: Family Medicine | Admitting: Family Medicine

## 2022-02-10 VITALS — BP 159/84 | HR 81 | Temp 97.8°F | Ht 64.0 in | Wt 191.0 lb

## 2022-02-10 DIAGNOSIS — E1122 Type 2 diabetes mellitus with diabetic chronic kidney disease: Secondary | ICD-10-CM | POA: Diagnosis not present

## 2022-02-10 DIAGNOSIS — N3946 Mixed incontinence: Secondary | ICD-10-CM | POA: Diagnosis not present

## 2022-02-10 DIAGNOSIS — K529 Noninfective gastroenteritis and colitis, unspecified: Secondary | ICD-10-CM | POA: Diagnosis not present

## 2022-02-10 DIAGNOSIS — I110 Hypertensive heart disease with heart failure: Secondary | ICD-10-CM | POA: Diagnosis not present

## 2022-02-10 DIAGNOSIS — I129 Hypertensive chronic kidney disease with stage 1 through stage 4 chronic kidney disease, or unspecified chronic kidney disease: Secondary | ICD-10-CM | POA: Diagnosis not present

## 2022-02-10 DIAGNOSIS — Z794 Long term (current) use of insulin: Secondary | ICD-10-CM | POA: Diagnosis not present

## 2022-02-10 DIAGNOSIS — N1832 Chronic kidney disease, stage 3b: Secondary | ICD-10-CM

## 2022-02-10 DIAGNOSIS — I5032 Chronic diastolic (congestive) heart failure: Secondary | ICD-10-CM

## 2022-02-10 DIAGNOSIS — E1149 Type 2 diabetes mellitus with other diabetic neurological complication: Secondary | ICD-10-CM

## 2022-02-10 LAB — POCT GLYCOSYLATED HEMOGLOBIN (HGB A1C): HbA1c, POC (controlled diabetic range): 7.4 % — AB (ref 0.0–7.0)

## 2022-02-10 LAB — GLUCOSE, POCT (MANUAL RESULT ENTRY): POC Glucose: 173 mg/dl — AB (ref 70–99)

## 2022-02-10 MED ORDER — OXYBUTYNIN CHLORIDE 5 MG PO TABS: 5.0000 mg | ORAL_TABLET | Freq: Two times a day (BID) | ORAL | 6 refills | Status: DC

## 2022-02-10 NOTE — Progress Notes (Signed)
Diarrhea real bad Can not hold urine.

## 2022-02-10 NOTE — Progress Notes (Signed)
Subjective:  Patient ID: [name] [name], female    DOB: [birth year]  Age: 61 y.o. MRN: [id number]  CC: Diarrhea   HPI [name] [name] is a 61 y.o. year old female with a history of type 2 diabetes mellitus (A1c 7.4), diabetic neuropathy, hypertension, diabetic retinopathy, hyperlipidemia, hepatitis C (completed treatment with harvoni), OSA, diastolic CHF (EF 60 to 07% from echo of 09/2021), stage III CKD. Accompanied by her daughter [name] to this visit.  Interval History:  She complains about diarrhea and has to wear depends and this occurs in her sleep as well.  This has been present for a while and she denies preceding abdominal cramping. Diarrhea is not related to certain foods  Also has urinary incontinence and describes this as urge incontinence which started 2 weeks ago but she has also had stress incontinence which has been present for a while.  Denies presence of chest pain, dyspnea.  She had a visit to the CHF clinic in 12/31/2021 Currently followed by the para medicine program.  Last saw Nephrology last month Bp is elevated and she states she is yet to take her antihypertensive.  With regards to her diabetes mellitus she informs me she administers 0.25 mg of Ozempic rather than 0.5 mg which appears on her chart.  Her A1c is 7.4 up from 5.2 previously.  She has had no hypoglycemic episodes.  Her diabetic neuropathy is monitored by ophthalmology. 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   Qualifier: Diagnosis of  By: Jorene Minors, [place]     Hypertension    MRSA (methicillin resistant Staphylococcus aureus)    Trichomonas    VAGINITIS, BACTERIAL, RECURRENT 08/15/2007   Qualifier: Diagnosis of  By: Radene Ou MD, [place]      Past Surgical History:  Procedure Laterality Date   BREAST BIOPSY Left 2018   CESAREAN SECTION      breech    Family History  Problem Relation Age of Onset   Colon cancer Mother    Liver disease Sister    Other Neg Hx    Breast cancer Neg Hx    Esophageal cancer Neg Hx    Rectal cancer Neg Hx    Tremor Neg Hx     Social History   Socioeconomic History   Marital status: Legally Separated    Spouse name: Not on file   Number of children: 1   Years of education: Not on file   Highest education level: 9th grade  Occupational History   Occupation: disability  Tobacco Use   Smoking status: Never   Smokeless tobacco: Never  Vaping Use   Vaping Use: Never used  Substance and Sexual Activity   Alcohol use: No   Drug use: Not Currently    Types: Cocaine, Marijuana    Comment: remote h/o cocaine 2015 and marijuana 2018   Sexual activity: Not Currently  Other Topics Concern   Not on file  Social History Narrative   Lives with friend, Rosalee Kaufman and nephew.  One daughter.    Social Determinants of Health   Financial Resource Strain: Low Risk  (12/29/2021)   Overall Financial Resource Strain (CARDIA)    Difficulty of Paying Living Expenses: Not very hard  Food Insecurity: No Food Insecurity (11/26/2021)   Hunger Vital Sign    Worried  About Running Out of Food in the Last Year: Never true    Ran Out of Food in the Last Year: Never true  Transportation Needs: No Transportation Needs (11/26/2021)   PRAPARE - Hydrologist (Medical): No    Lack of Transportation (Non-Medical): No  Recent Concern: Transportation Needs - Unmet Transportation Needs (08/31/2021)   PRAPARE - Transportation    Lack of Transportation (Medical): No    Lack of Transportation (Non-Medical): Yes  Physical Activity: Insufficiently Active (02/04/2022)   Exercise Vital Sign    Days of Exercise per Week: 1 day    Minutes of Exercise per Session: 30 min  Stress: No Stress Concern Present (09/21/2021)   [place]     Feeling of Stress : Not at all  Social Connections: Moderately Integrated (12/29/2021)   Social Connection and Isolation Panel [NHANES]    Frequency of Communication with Friends and Family: More than three times a week    Frequency of Social Gatherings with Friends and Family: More than three times a week    Attends Religious Services: More than 4 times per year    Active Member of [employer] or Organizations: No    Attends Archivist Meetings: Never    Marital Status: Living with partner    Allergies  Allergen Reactions   Lisinopril Swelling    Outpatient Medications Prior to Visit  Medication Sig Dispense Refill   albuterol (VENTOLIN HFA) 108 (90 Base) MCG/ACT inhaler Inhale 1 puff into the lungs every 4 (four) hours as needed for wheezing or shortness of breath. 18 g 0   amLODipine (NORVASC) 2.5 MG tablet Take 1 tablet (2.5 mg total) by mouth daily. 90 tablet 1   atorvastatin (LIPITOR) 40 MG tablet Take 1 tablet (40 mg total) by mouth every morning. 90 tablet 1   carvedilol (COREG) 25 MG tablet Take 1 tablet (25 mg total) by mouth 2 (two) times daily. 180 tablet 1   empagliflozin (JARDIANCE) 10 MG TABS tablet Take 1 tablet (10 mg total) by mouth daily. 90 tablet 1   famotidine (PEPCID) 40 MG tablet Take 1 tablet (40 mg total) by mouth daily. 30 tablet 3   ferrous sulfate 325 (65 FE) MG tablet Take 325 mg by mouth daily with breakfast.     hydrALAZINE (APRESOLINE) 50 MG tablet Take 1 tablet (50 mg total) by mouth 3 (three) times daily. 270 tablet 1   mometasone-formoterol (DULERA) 100-5 MCG/ACT AERO Inhale 2 puffs into the lungs 2 (two) times daily. 1 each 0   nystatin ointment (MYCOSTATIN) Apply 1 application. topically 2 (two) times daily as needed (rash/skin irritation). 30 g 1   pregabalin (LYRICA) 75 MG capsule Take 1 capsule (75 mg total) by mouth 2 (two) times daily. 180 capsule 1   Semaglutide,0.25 or 0.'5MG'$ /DOS, (OZEMPIC, 0.25 OR 0.5 MG/DOSE,) 2 MG/1.5ML SOPN Inject 0.5 mg  into the skin once a week. 2 mL 6   spironolactone (ALDACTONE) 25 MG tablet Take 0.5 tablets (12.5 mg total) by mouth daily. 30 tablet 3   torsemide (DEMADEX) 20 MG tablet Take 3 tablets (60 mg total) by mouth daily. 90 tablet 6   potassium chloride SA (KLOR-CON M) 20 MEQ tablet Take 2 tablets (40 mEq total) by mouth once for 1 dose. (Patient not taking: Reported on 12/31/2021) 2 tablet 0   No facility-administered medications prior to visit.     ROS Review of Systems  Constitutional:  Negative for activity change and appetite change.  HENT:  Negative for sinus pressure and sore throat.   Respiratory:  Negative for chest tightness, shortness of breath and wheezing.   Cardiovascular:  Negative for chest pain and palpitations.  Gastrointestinal:  Positive for diarrhea. Negative for abdominal distention, abdominal pain and constipation.  Genitourinary: Negative.   Musculoskeletal: Negative.   Psychiatric/Behavioral:  Negative for behavioral problems and dysphoric mood.     Objective:  BP (!) 159/84   Pulse 81   Temp 97.8 F (36.6 C) (Oral)   Ht '5\' 4"'$  (1.626 m)   Wt 191 lb (86.6 kg)   LMP 04/01/2010   SpO2 98%   BMI 32.79 kg/m      02/10/2022    9:34 AM 02/09/2022   11:03 AM 02/02/2022   11:19 AM  BP/Weight  Systolic BP 557 322 025  Diastolic BP 84 80 70  Wt. (Lbs) 191 192 189.2  BMI 32.79 kg/m2 32.96 kg/m2 32.48 kg/m2      Physical Exam Constitutional:      Appearance: She is well-developed.  Eyes:     Comments: Proptosis  Cardiovascular:     Rate and Rhythm: Normal rate.     Heart sounds: Normal heart sounds. No murmur heard. Pulmonary:     Effort: Pulmonary effort is normal.     Breath sounds: Normal breath sounds. No wheezing or rales.  Chest:     Chest wall: No tenderness.  Abdominal:     General: Bowel sounds are normal. There is no distension.     Palpations: Abdomen is soft. There is no mass.     Tenderness: There is no abdominal tenderness.   Musculoskeletal:        General: Normal range of motion.     Right lower leg: No edema.     Left lower leg: No edema.  Neurological:     Mental Status: She is alert and oriented to person, place, and time.  Psychiatric:        Mood and Affect: Mood normal.        Latest Ref Rng & Units 12/21/2021    2:19 PM 12/08/2021   10:43 AM 12/01/2021   12:13 PM  CMP  Glucose 70 - 99 mg/dL 181  134  177   BUN 8 - 27 mg/dL 71  42  47   Creatinine 0.57 - 1.00 mg/dL 1.78  1.60  1.61   Sodium 134 - 144 mmol/L 142  141  140   Potassium 3.5 - 5.2 mmol/L 4.2  4.4  3.3   Chloride 96 - 106 mmol/L 104  107  109   CO2 20 - 29 mmol/L '19  27  23   '$ Calcium 8.7 - 10.3 mg/dL 10.3  10.0  9.5     Lipid Panel     Component Value Date/Time   CHOL 147 01/29/2019 1118   TRIG 102 01/29/2019 1118   HDL 59 01/29/2019 1118   CHOLHDL 2.5 01/29/2019 1118   CHOLHDL 3 06/18/2014 0951   VLDL 19.6 06/18/2014 0951   LDLCALC 68 01/29/2019 1118    CBC    Component Value Date/Time   WBC 3.6 (L) 09/25/2021 0315   RBC 3.11 (L) 09/25/2021 0315   HGB 8.0 (L) 09/25/2021 0315   HGB 9.8 (L) 07/10/2019 1544   HCT 26.3 (L) 09/25/2021 0315   HCT 30.9 (L) 07/10/2019 1544   PLT 168 09/25/2021 0315   PLT 171 07/10/2019 1544   MCV 84.6  09/25/2021 0315   MCV 86 07/10/2019 1544   MCH 25.7 (L) 09/25/2021 0315   MCHC 30.4 09/25/2021 0315   RDW 13.8 09/25/2021 0315   RDW 12.7 07/10/2019 1544   LYMPHSABS 1.0 09/22/2021 1209   LYMPHSABS 1.3 07/10/2019 1544   MONOABS 0.4 09/22/2021 1209   EOSABS 0.0 09/22/2021 1209   EOSABS 0.0 07/10/2019 1544   BASOSABS 0.0 09/22/2021 1209   BASOSABS 0.0 07/10/2019 1544    Lab Results  Component Value Date   HGBA1C 7.4 (A) 02/10/2022    Lab Results  Component Value Date   TSH 1.547 08/18/2019    Assessment & Plan:  1. 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 ([place]) Uncontrolled due to the fact that she is yet to take her  antihypertensive which I have refilled Adherence emphasized Counseled on blood pressure goal of less than 130/80, low-sodium, DASH diet, medication compliance, 150 minutes of moderate intensity exercise per week. Discussed medication compliance, adverse effects. - LP+Non-HDL Cholesterol  2. Chronic diarrhea We will need to evaluate for pancreatic insufficiency - T3, free - T4, free - TSH - Ambulatory referral to Gastroenterology - Calprotectin, Fecal - Pancreatic Elastase, Fecal  3. Type 2 diabetes mellitus with other neurologic complication, with long-term current use of insulin (HCC) Stable with A1c of 7.4 but this has trended up from 5.2 previously I [facility] and verified that the patient sent to her does have a Dyal for both 0.25 and 0.5 mg but it appears she has not been dialing all the way up to the 0.5 mg dose Advised to increase to 0.5 mg of Ozempic and if she notices hypoglycemia she needs to contact the clinic and we will have her decrease her dose back. Her daughter also understands this and will ensure patient administers right dose - POCT glucose (manual entry) - POCT glycosylated hemoglobin (Hb A1C)  4. Hypertensive heart disease with chronic diastolic congestive heart failure (HCC) EF 60 to 65% from echo of 09/2021 Euvolemic Continue SGLT2i, Lasix, beta-blocker, not on Entresto due to ACE inhibitor allergy  5. Type 2 diabetes mellitus with stage 3b chronic kidney disease, without long-term current use of insulin (HCC) Diabetic and hypertensive nephropathy Continue to follow-up with nephrology  6. Urinary incontinence, mixed Counseled on Kegel exercises We will initiate oxybutynin - oxybutynin (DITROPAN) 5 MG tablet; Take 1 tablet (5 mg total) by mouth 2 (two) times daily.  Dispense: 60 tablet; Refill: 6    Meds ordered this encounter  Medications   oxybutynin (DITROPAN) 5 MG tablet    Sig: Take 1 tablet (5 mg total) by mouth 2  (two) times daily.    Dispense:  60 tablet    Refill:  6    Follow-up: Return in about 3 months (around 05/13/2022) for Chronic medical conditions.       [name], MD, FAAFP. [facility] [place], [place]   02/10/2022, 12:32 PM

## 2022-02-10 NOTE — Patient Instructions (Signed)
Urinary Incontinence Urinary incontinence refers to a condition in which a person is unable to control where and when to pass urine. A person with this condition will urinate involuntarily. This means that the person urinates when he or she does not mean to. What are the causes? This condition may be caused by: Medicines. Infections. Constipation. Overactive bladder muscles. Weak bladder muscles. Weak pelvic floor muscles. These muscles provide support for the bladder, intestine, and, in women, the uterus. Enlarged prostate in men. The prostate is a gland near the bladder. When it gets too big, it can pinch the urethra. With the urethra blocked, the bladder can weaken and lose the ability to empty properly. Surgery. Emotional factors, such as anxiety, stress, or post-traumatic stress disorder (PTSD). Spinal cord injury, nerve injury, or other neurological conditions. Pelvic organ prolapse. This happens in women when organs move out of place and into the vagina. This movement can prevent the bladder and urethra from working properly. What increases the risk? The following factors may make you more likely to develop this condition: Age. The older you are, the higher the risk. Obesity. Being physically inactive. Pregnancy and childbirth. Menopause. Diseases that affect the nerves or spinal cord. Long-term, or chronic, coughing. This can increase pressure on the bladder and pelvic floor muscles. What are the signs or symptoms? Symptoms may vary depending on the type of urinary incontinence you have. They include: A sudden urge to urinate, and passing urine involuntarily before you can get to a bathroom (urge incontinence). Suddenly passing urine when doing activities that force urine to pass, such as coughing, laughing, exercising, or sneezing (stress incontinence). Needing to urinate often but urinating only a small amount, or constantly dribbling urine (overflow incontinence). Urinating  because you cannot get to the bathroom in time due to a physical disability, such as arthritis or injury, or due to a communication or thinking problem, such as Alzheimer's disease (functional incontinence). How is this diagnosed? This condition may be diagnosed based on: Your medical history. A physical exam. Tests, such as: Urine tests. X-rays of your kidney and bladder. Ultrasound. CT scan. Cystoscopy. In this procedure, a health care provider inserts a tube with a light and camera (cystoscope) through the urethra and into the bladder to check for problems. Urodynamic testing. These tests assess how well the bladder, urethra, and sphincter can store and release urine. There are different types of urodynamic tests, and they vary depending on what the test is measuring. To help diagnose your condition, your health care provider may recommend that you keep a log of when you urinate and how much you urinate. How is this treated? Treatment for this condition depends on the type of incontinence that you have and its cause. Treatment may include: Lifestyle changes, such as: Quitting smoking. Maintaining a healthy weight. Staying active. Try to get 150 minutes of moderate-intensity exercise every week. Ask your health care provider which activities are safe for you. Eating a healthy diet. Avoid high-fat foods, like fried foods. Avoid refined carbohydrates like white bread and white rice. Limit how much alcohol and caffeine you drink. Increase your fiber intake. Healthy sources of fiber include beans, whole grains, and fresh fruits and vegetables. Behavioral changes, such as: Pelvic floor muscle exercises. Bladder training, such as lengthening the amount of time between bathroom breaks, or using the bathroom at regular intervals. Using techniques to suppress bladder urges. This can include distraction techniques or controlled breathing exercises. Medicines, such as: Medicines to relax the  bladder   muscles and prevent bladder spasms. Medicines to help slow or prevent the growth of a man's prostate. Botox injections. These can help relax the bladder muscles. Treatments, such as: Using pulses of electricity to help change bladder reflexes (electrical nerve stimulation). For women, using a medical device to prevent urine leaks. This is a small, tampon-like, disposable device that is inserted into the urethra. Injecting collagen or carbon beads (bulking agents) into the urinary sphincter. These can help thicken tissue and close the bladder opening. Surgery. Follow these instructions at home: Lifestyle Limit alcohol and caffeine. These can fill your bladder quickly and irritate it. Keep yourself clean to help prevent odors and skin damage. Ask your health care provider about special skin creams and cleansers that can protect the skin from urine. Consider wearing pads or adult diapers. Make sure to change them regularly, and always change them right after experiencing incontinence. General instructions Take over-the-counter and prescription medicines only as told by your health care provider. Use the bathroom about every 3-4 hours, even if you do not feel the need to urinate. Try to empty your bladder completely every time. After urinating, wait a minute. Then try to urinate again. Make sure you are in a relaxed position while urinating. If your incontinence is caused by nerve problems, keep a log of the medicines you take and the times you go to the bathroom. Keep all follow-up visits. This is important. Where to find more information National Institute of Diabetes and Digestive and Kidney Diseases: www.niddk.nih.gov American Urology Association: www.urologyhealth.org Contact a health care provider if: You have pain that gets worse. Your incontinence gets worse. Get help right away if: You have a fever or chills. You are unable to urinate. You have redness in your groin area or  down your legs. Summary Urinary incontinence refers to a condition in which a person is unable to control where and when to pass urine. This condition may be caused by medicines, infection, weak bladder muscles, weak pelvic floor muscles, enlargement of the prostate (in men), or surgery. Factors such as older age, obesity, pregnancy and childbirth, menopause, neurological diseases, and chronic coughing may increase your risk for developing this condition. Types of urinary incontinence include urge incontinence, stress incontinence, overflow incontinence, and functional incontinence. This condition is usually treated first with lifestyle and behavioral changes, such as quitting smoking, eating a healthier diet, and doing regular pelvic floor exercises. Other treatment options include medicines, bulking agents, medical devices, electrical nerve stimulation, or surgery. This information is not intended to replace advice given to you by your health care provider. Make sure you discuss any questions you have with your health care provider. Document Revised: 02/08/2020 Document Reviewed: 02/08/2020 Elsevier Patient Education  2023 [employer]vier Inc.  

## 2022-02-11 ENCOUNTER — Telehealth (HOSPITAL_COMMUNITY): Payer: Self-pay | Admitting: Cardiology

## 2022-02-11 ENCOUNTER — Other Ambulatory Visit: Payer: Self-pay | Admitting: Family Medicine

## 2022-02-11 DIAGNOSIS — R7989 Other specified abnormal findings of blood chemistry: Secondary | ICD-10-CM

## 2022-02-11 LAB — LP+NON-HDL CHOLESTEROL
Cholesterol, Total: 113 mg/dL (ref 100–199)
HDL: 52 mg/dL (ref >39)
LDL Chol Calc (NIH): 45 mg/dL (ref 0–99)
Total Non-HDL-Chol (LDL+VLDL): 61 mg/dL (ref 0–129)
Triglycerides: 81 mg/dL (ref 0–149)
VLDL Cholesterol Cal: 16 mg/dL (ref 5–40)

## 2022-02-11 LAB — TSH: TSH: <0.005 u[IU]/mL — ABNORMAL LOW (ref 0.450–4.500)

## 2022-02-11 LAB — T3, FREE: T3, Free: 7.5 pg/mL — ABNORMAL HIGH (ref 2.0–4.4)

## 2022-02-11 LAB — T4, FREE: Free T4: 3.14 ng/dL — ABNORMAL HIGH (ref 0.82–1.77)

## 2022-02-11 NOTE — Telephone Encounter (Signed)
Cardio mems approval denied Appeal denied 12/18/21 Case closed

## 2022-02-12 ENCOUNTER — Telehealth: Payer: Self-pay | Admitting: Emergency Medicine

## 2022-02-12 NOTE — Telephone Encounter (Signed)
Copied from [place] [id number]. Topic: General - Other >> Feb 12, 2022  4:13 PM Everette C wrote:  Reason for CRM: The patient has been directed by [facility] to contact their PCP and request surgical clearance as well as office notes from 02/10/22 including updated labs   The patient will need this information submitted to schedule their breast reduction  Please contact further when possible

## 2022-02-16 ENCOUNTER — Other Ambulatory Visit (HOSPITAL_COMMUNITY): Payer: Self-pay | Admitting: Emergency Medicine

## 2022-02-16 NOTE — Progress Notes (Signed)
Paramedicine Encounter    Patient ID: [name] [name], female    DOB: 12/14/1960, 61 y.o.   MRN: [id number]   LMP 04/01/2010  Weight yesterday-not taken Last visit weight-192lb CBG 199  ATF Ms. Fei A&O x 4, skin warm and dry with good color.  No SOB, no nausea or vomiting.  She had a brief epidodeof chest pain sharp and stabbing in nature that came on suddenly and went away when she changed position in the bed.  She states she is pain free now.  Discussed what to do if it happens again and to listen to her body and  look for other possible signs or symptoms such as SOB, nausea or sweating.   Lung sounds clear and equal bilat.  No edema or JVD  Pt did well with her medications missing only 1 dose from the evening regimen.  Med box reconciled.  Refill called in for Carvedilol to [facility].  Home visit complete.    Patient Care Team: [name], MD as PCP - General (Family Medicine) Minus Breeding, MD as PCP - Cardiology (Cardiology) Craft, Lorel Monaco, RN as Case Manager [facility], Tami Lin, [place] as Physician Assistant (Cardiology)  Patient Active Problem List   Diagnosis Date Noted   Acute on chronic heart failure with preserved ejection fraction (HFpEF) ([place]) 09/22/2021   Heart failure ([place]) 09/22/2021   CKD (chronic kidney disease) stage 4, GFR 15-29 ml/min ([place]) 07/03/2021   Acute respiratory failure due to COVID-19 ([place]) 05/06/2021   COVID-19 05/05/2021   CHF exacerbation ([place]) 04/02/2021   Chest pain 04/02/2021   Acute-on-chronic kidney injury ([place]) 04/02/2021   COPD exacerbation ([place]) 12/05/2020   CKD (chronic kidney disease), stage III ([place]) 12/04/2020   Anemia of chronic disease 12/04/2020   Obesity, Class III, BMI 40-49.9 (morbid obesity) ([place]) 07/27/2020   Acute respiratory disease due to COVID-19 virus 07/25/2020   Acute hypoxemic respiratory failure due to COVID-19 ([place]) 07/25/2020   Morbid obesity ([place]) 11/23/2019   Acute respiratory failure with hypoxia  ([place]) 11/06/2019   Chronic kidney disease, stage 3b ([place]) 11/06/2019   COPD without exacerbation ([place])    Acute kidney failure ([place]) 08/18/2019   Left ventricular hypertrophy 07/21/2019   Educated about COVID-19 virus infection 11/57/2620   Acute diastolic HF (heart failure) (HCC)    Hypoxia    OSA (obstructive sleep apnea) 05/09/2019   Hyperglycemia 04/09/2019   Snoring 03/13/2019   Chronic anemia 08/17/2018   Chronic cystitis 08/03/2018   Diabetic neuropathy ([place]) 06/16/2017   Non compliance w medication regimen 04/12/2017   Family history of colon cancer in mother 11/17/2016   Dyspareunia in female 11/11/2016   Screen for colon cancer 11/11/2016   History of ovarian cyst 11/11/2016   Chronic hepatitis C without hepatic coma ([place]) 11/09/2016   Hyperlipidemia 10/07/2016   Type 2 diabetes mellitus with hyperlipidemia ([place]) 01/29/2014   Status post intraocular lens implant 11/08/2013   PCO (posterior capsular opacification) 09/28/2013   Pseudophakia 09/12/2013   GERD (gastroesophageal reflux disease) 09/06/2013   Hypertension 09/06/2013   ANEMIA, IRON DEFICIENCY 10/03/2009   LIPOMA 06/18/2009   TINEA PEDIS 05/07/2009   SUBSTANCE ABUSE, MULTIPLE 02/22/2007   HCVD (hypertensive cardiovascular disease) 02/22/2007    Current Outpatient Medications:    albuterol (VENTOLIN HFA) 108 (90 Base) MCG/ACT inhaler, Inhale 1 puff into the lungs every 4 (four) hours as needed for wheezing or shortness of breath., Disp: 18 g, Rfl: 0   amLODipine (NORVASC) 2.5 MG tablet, Take 1  tablet (2.5 mg total) by mouth daily., Disp: 90 tablet, Rfl: 1   atorvastatin (LIPITOR) 40 MG tablet, Take 1 tablet (40 mg total) by mouth every morning., Disp: 90 tablet, Rfl: 1   carvedilol (COREG) 25 MG tablet, Take 1 tablet (25 mg total) by mouth 2 (two) times daily., Disp: 180 tablet, Rfl: 1   empagliflozin (JARDIANCE) 10 MG TABS tablet, Take 1 tablet (10 mg total) by mouth daily., Disp: 90 tablet, Rfl: 1    famotidine (PEPCID) 40 MG tablet, Take 1 tablet (40 mg total) by mouth daily., Disp: 30 tablet, Rfl: 3   ferrous sulfate 325 (65 FE) MG tablet, Take 325 mg by mouth daily with breakfast., Disp: , Rfl:    hydrALAZINE (APRESOLINE) 50 MG tablet, Take 1 tablet (50 mg total) by mouth 3 (three) times daily., Disp: 270 tablet, Rfl: 1   mometasone-formoterol (DULERA) 100-5 MCG/ACT AERO, Inhale 2 puffs into the lungs 2 (two) times daily., Disp: 1 each, Rfl: 0   nystatin ointment (MYCOSTATIN), Apply 1 application. topically 2 (two) times daily as needed (rash/skin irritation)., Disp: 30 g, Rfl: 1   oxybutynin (DITROPAN) 5 MG tablet, Take 1 tablet (5 mg total) by mouth 2 (two) times daily., Disp: 60 tablet, Rfl: 6   potassium chloride SA (KLOR-CON M) 20 MEQ tablet, Take 2 tablets (40 mEq total) by mouth once for 1 dose. (Patient not taking: Reported on 12/31/2021), Disp: 2 tablet, Rfl: 0   pregabalin (LYRICA) 75 MG capsule, Take 1 capsule (75 mg total) by mouth 2 (two) times daily., Disp: 180 capsule, Rfl: 1   Semaglutide,0.25 or 0.'5MG'$ /DOS, (OZEMPIC, 0.25 OR 0.5 MG/DOSE,) 2 MG/1.5ML SOPN, Inject 0.5 mg into the skin once a week., Disp: 2 mL, Rfl: 6   spironolactone (ALDACTONE) 25 MG tablet, Take 0.5 tablets (12.5 mg total) by mouth daily., Disp: 30 tablet, Rfl: 3   torsemide (DEMADEX) 20 MG tablet, Take 3 tablets (60 mg total) by mouth daily., Disp: 90 tablet, Rfl: 6 Allergies  Allergen Reactions   Lisinopril Swelling      Social History   Socioeconomic History   Marital status: Legally Separated    Spouse name: Not on file   Number of children: 1   Years of education: Not on file   Highest education level: 9th grade  Occupational History   Occupation: disability  Tobacco Use   Smoking status: Never   Smokeless tobacco: Never  Vaping Use   Vaping Use: Never used  Substance and Sexual Activity   Alcohol use: No   Drug use: Not Currently    Types: Cocaine, Marijuana    Comment: remote h/o  cocaine 2015 and marijuana 2018   Sexual activity: Not Currently  Other Topics Concern   Not on file  Social History Narrative   Lives with friend, Rosalee Kaufman and nephew.  One daughter.    Social Determinants of Health   Financial Resource Strain: Low Risk  (12/29/2021)   Overall Financial Resource Strain (CARDIA)    Difficulty of Paying Living Expenses: Not very hard  Food Insecurity: No Food Insecurity (11/26/2021)   Hunger Vital Sign    Worried About Running Out of Food in the Last Year: Never true    Ran Out of Food in the Last Year: Never true  Transportation Needs: No Transportation Needs (11/26/2021)   PRAPARE - Hydrologist (Medical): No    Lack of Transportation (Non-Medical): No  Recent Concern: Transportation Needs - Unmet Transportation Needs (08/31/2021)  PRAPARE - Hydrologist (Medical): No    Lack of Transportation (Non-Medical): Yes  Physical Activity: Insufficiently Active (02/04/2022)   Exercise Vital Sign    Days of Exercise per Week: 1 day    Minutes of Exercise per Session: 30 min  Stress: No Stress Concern Present (09/21/2021)   [place]    Feeling of Stress : Not at all  Social Connections: Moderately Integrated (12/29/2021)   Social Connection and Isolation Panel [NHANES]    Frequency of Communication with Friends and Family: More than three times a week    Frequency of Social Gatherings with Friends and Family: More than three times a week    Attends Religious Services: More than 4 times per year    Active Member of [employer] or Organizations: No    Attends Archivist Meetings: Never    Marital Status: Living with partner  Intimate Partner Violence: Not At Risk (02/04/2022)   Humiliation, Afraid, Rape, and Kick questionnaire    Fear of Current or Ex-Partner: No    Emotionally Abused: No    Physically Abused: No    Sexually Abused:  No    Physical Exam      Future Appointments  Date Time Provider [place]  03/03/2022  3:15 PM [facility] CCC-MM CARE MANAGER 2 THN-CCC None  03/16/2022 10:00 AM Larey Dresser, MD MC-HVSC None  03/23/2022  1:40 PM Minus Breeding, MD CVD-NORTHLIN [facility] [place]  05/31/2022 11:10 AM [name], MD CHW-CHWW None       [name], [place] [facility] Paramedic  02/16/22

## 2022-02-18 NOTE — Telephone Encounter (Signed)
Does patient need to come in for surgery clearance.

## 2022-02-19 NOTE — Telephone Encounter (Signed)
She does not need an office visit however her last set of labs revealed abnormal thyroid which I had advised her to repeat in 1 month so we know if she needs to be treated or not.  That is the only hold-up for the surgery clearance.

## 2022-02-23 ENCOUNTER — Other Ambulatory Visit (HOSPITAL_COMMUNITY): Payer: Self-pay | Admitting: Emergency Medicine

## 2022-02-23 NOTE — Progress Notes (Signed)
Paramedicine Encounter    Patient ID: [name] [name], female    DOB: Dec 08, 1960, 61 y.o.   MRN: [id number]   LMP 04/01/2010  Weight yesterday-not taken Last visit weight-188.9lb  ATF Ms. Illingworth A&O x 4, skin W&D w/ good color.   Pt. Denies chest pain or SOB.  Lung sounds clear and equal bilat.  No edema or JVD noted.  She has done well with her med compliance.  Med box reconciled.  Reviewed meds with her boyfriend Fritz Pickerel.  He states he will be fine filling her med box the week of the 14th.  He was doing her pill box prior to me coming to visit.  Refill called in to [facility] for her Torsemide.   No further needs at this time.  Home visit complete.  Patient Care Team: [name], MD as PCP - General (Family Medicine) Minus Breeding, MD as PCP - Cardiology (Cardiology) Craft, Lorel Monaco, RN as Case Manager [facility], Tami Lin, [place] as Physician Assistant (Cardiology)  Patient Active Problem List   Diagnosis Date Noted   Acute on chronic heart failure with preserved ejection fraction (HFpEF) ([place]) 09/22/2021   Heart failure ([place]) 09/22/2021   CKD (chronic kidney disease) stage 4, GFR 15-29 ml/min ([place]) 07/03/2021   Acute respiratory failure due to COVID-19 ([place]) 05/06/2021   COVID-19 05/05/2021   CHF exacerbation ([place]) 04/02/2021   Chest pain 04/02/2021   Acute-on-chronic kidney injury ([place]) 04/02/2021   COPD exacerbation ([place]) 12/05/2020   CKD (chronic kidney disease), stage III ([place]) 12/04/2020   Anemia of chronic disease 12/04/2020   Obesity, Class III, BMI 40-49.9 (morbid obesity) ([place]) 07/27/2020   Acute respiratory disease due to COVID-19 virus 07/25/2020   Acute hypoxemic respiratory failure due to COVID-19 ([place]) 07/25/2020   Morbid obesity ([place]) 11/23/2019   Acute respiratory failure with hypoxia ([place]) 11/06/2019   Chronic kidney disease, stage 3b ([place]) 11/06/2019   COPD without exacerbation ([place])    Acute kidney failure ([place]) 08/18/2019   Left ventricular  hypertrophy 07/21/2019   Educated about COVID-19 virus infection 85/88/5027   Acute diastolic HF (heart failure) (HCC)    Hypoxia    OSA (obstructive sleep apnea) 05/09/2019   Hyperglycemia 04/09/2019   Snoring 03/13/2019   Chronic anemia 08/17/2018   Chronic cystitis 08/03/2018   Diabetic neuropathy ([place]) 06/16/2017   Non compliance w medication regimen 04/12/2017   Family history of colon cancer in mother 11/17/2016   Dyspareunia in female 11/11/2016   Screen for colon cancer 11/11/2016   History of ovarian cyst 11/11/2016   Chronic hepatitis C without hepatic coma ([place]) 11/09/2016   Hyperlipidemia 10/07/2016   Type 2 diabetes mellitus with hyperlipidemia ([place]) 01/29/2014   Status post intraocular lens implant 11/08/2013   PCO (posterior capsular opacification) 09/28/2013   Pseudophakia 09/12/2013   GERD (gastroesophageal reflux disease) 09/06/2013   Hypertension 09/06/2013   ANEMIA, IRON DEFICIENCY 10/03/2009   LIPOMA 06/18/2009   TINEA PEDIS 05/07/2009   SUBSTANCE ABUSE, MULTIPLE 02/22/2007   HCVD (hypertensive cardiovascular disease) 02/22/2007    Current Outpatient Medications:    albuterol (VENTOLIN HFA) 108 (90 Base) MCG/ACT inhaler, Inhale 1 puff into the lungs every 4 (four) hours as needed for wheezing or shortness of breath., Disp: 18 g, Rfl: 0   amLODipine (NORVASC) 2.5 MG tablet, Take 1 tablet (2.5 mg total) by mouth daily., Disp: 90 tablet, Rfl: 1   atorvastatin (LIPITOR) 40 MG tablet, Take 1 tablet (40 mg total) by mouth every morning., Disp: 90  tablet, Rfl: 1   carvedilol (COREG) 25 MG tablet, Take 1 tablet (25 mg total) by mouth 2 (two) times daily., Disp: 180 tablet, Rfl: 1   empagliflozin (JARDIANCE) 10 MG TABS tablet, Take 1 tablet (10 mg total) by mouth daily., Disp: 90 tablet, Rfl: 1   famotidine (PEPCID) 40 MG tablet, Take 1 tablet (40 mg total) by mouth daily., Disp: 30 tablet, Rfl: 3   ferrous sulfate 325 (65 FE) MG tablet, Take 325 mg by mouth daily  with breakfast., Disp: , Rfl:    hydrALAZINE (APRESOLINE) 50 MG tablet, Take 1 tablet (50 mg total) by mouth 3 (three) times daily., Disp: 270 tablet, Rfl: 1   mometasone-formoterol (DULERA) 100-5 MCG/ACT AERO, Inhale 2 puffs into the lungs 2 (two) times daily., Disp: 1 each, Rfl: 0   nystatin ointment (MYCOSTATIN), Apply 1 application. topically 2 (two) times daily as needed (rash/skin irritation). (Patient not taking: Reported on 02/16/2022), Disp: 30 g, Rfl: 1   oxybutynin (DITROPAN) 5 MG tablet, Take 1 tablet (5 mg total) by mouth 2 (two) times daily., Disp: 60 tablet, Rfl: 6   potassium chloride SA (KLOR-CON M) 20 MEQ tablet, Take 2 tablets (40 mEq total) by mouth once for 1 dose. (Patient not taking: Reported on 12/31/2021), Disp: 2 tablet, Rfl: 0   pregabalin (LYRICA) 75 MG capsule, Take 1 capsule (75 mg total) by mouth 2 (two) times daily., Disp: 180 capsule, Rfl: 1   Semaglutide,0.25 or 0.'5MG'$ /DOS, (OZEMPIC, 0.25 OR 0.5 MG/DOSE,) 2 MG/1.5ML SOPN, Inject 0.5 mg into the skin once a week., Disp: 2 mL, Rfl: 6   spironolactone (ALDACTONE) 25 MG tablet, Take 0.5 tablets (12.5 mg total) by mouth daily., Disp: 30 tablet, Rfl: 3   torsemide (DEMADEX) 20 MG tablet, Take 3 tablets (60 mg total) by mouth daily., Disp: 90 tablet, Rfl: 6 Allergies  Allergen Reactions   Lisinopril Swelling      Social History   Socioeconomic History   Marital status: Legally Separated    Spouse name: Not on file   Number of children: 1   Years of education: Not on file   Highest education level: 9th grade  Occupational History   Occupation: disability  Tobacco Use   Smoking status: Never   Smokeless tobacco: Never  Vaping Use   Vaping Use: Never used  Substance and Sexual Activity   Alcohol use: No   Drug use: Not Currently    Types: Cocaine, Marijuana    Comment: remote h/o cocaine 2015 and marijuana 2018   Sexual activity: Not Currently  Other Topics Concern   Not on file  Social History Narrative    Lives with friend, Rosalee Kaufman and nephew.  One daughter.    Social Determinants of Health   Financial Resource Strain: Low Risk  (12/29/2021)   Overall Financial Resource Strain (CARDIA)    Difficulty of Paying Living Expenses: Not very hard  Food Insecurity: No Food Insecurity (11/26/2021)   Hunger Vital Sign    Worried About Running Out of Food in the Last Year: Never true    Ran Out of Food in the Last Year: Never true  Transportation Needs: No Transportation Needs (11/26/2021)   PRAPARE - Hydrologist (Medical): No    Lack of Transportation (Non-Medical): No  Recent Concern: Transportation Needs - Unmet Transportation Needs (08/31/2021)   PRAPARE - Hydrologist (Medical): No    Lack of Transportation (Non-Medical): Yes  Physical Activity: Insufficiently  Active (02/04/2022)   Exercise Vital Sign    Days of Exercise per Week: 1 day    Minutes of Exercise per Session: 30 min  Stress: No Stress Concern Present (09/21/2021)   [place]    Feeling of Stress : Not at all  Social Connections: Moderately Integrated (12/29/2021)   Social Connection and Isolation Panel [NHANES]    Frequency of Communication with Friends and Family: More than three times a week    Frequency of Social Gatherings with Friends and Family: More than three times a week    Attends Religious Services: More than 4 times per year    Active Member of [employer] or Organizations: No    Attends Archivist Meetings: Never    Marital Status: Living with partner  Intimate Partner Violence: Not At Risk (02/04/2022)   Humiliation, Afraid, Rape, and Kick questionnaire    Fear of Current or Ex-Partner: No    Emotionally Abused: No    Physically Abused: No    Sexually Abused: No    Physical Exam      Future Appointments  Date Time Provider [place]  02/24/2022 11:30 AM CHW-CHWW LAB CHW-CHWW  None  03/03/2022  3:15 PM [facility] CCC-MM CARE MANAGER 2 THN-CCC None  03/16/2022 10:00 AM Larey Dresser, MD MC-HVSC None  03/23/2022  1:40 PM Minus Breeding, MD CVD-NORTHLIN [facility]  05/31/2022 11:10 AM [name], MD CHW-CHWW None       [name], [facility] Paramedic  02/23/22

## 2022-02-23 NOTE — Telephone Encounter (Signed)
Call placed to patient and she has been informed of lab appointment.

## 2022-02-24 ENCOUNTER — Ambulatory Visit: Payer: Medicare Other | Attending: Family Medicine

## 2022-02-24 DIAGNOSIS — E0590 Thyrotoxicosis, unspecified without thyrotoxic crisis or storm: Secondary | ICD-10-CM | POA: Diagnosis not present

## 2022-02-24 DIAGNOSIS — R7989 Other specified abnormal findings of blood chemistry: Secondary | ICD-10-CM | POA: Diagnosis not present

## 2022-02-25 ENCOUNTER — Other Ambulatory Visit: Payer: Self-pay | Admitting: Family Medicine

## 2022-02-25 LAB — T3, FREE: T3, Free: 6.4 pg/mL — ABNORMAL HIGH (ref 2.0–4.4)

## 2022-02-25 LAB — T4, FREE: Free T4: 2.81 ng/dL — ABNORMAL HIGH (ref 0.82–1.77)

## 2022-02-25 LAB — TSH: TSH: <0.005 u[IU]/mL — ABNORMAL LOW (ref 0.450–4.500)

## 2022-02-26 ENCOUNTER — Other Ambulatory Visit: Payer: Self-pay | Admitting: Family Medicine

## 2022-02-26 DIAGNOSIS — R7989 Other specified abnormal findings of blood chemistry: Secondary | ICD-10-CM

## 2022-02-26 DIAGNOSIS — E0590 Thyrotoxicosis, unspecified without thyrotoxic crisis or storm: Secondary | ICD-10-CM

## 2022-02-26 MED ORDER — METHIMAZOLE 5 MG PO TABS: 5.0000 mg | ORAL_TABLET | Freq: Three times a day (TID) | ORAL | 1 refills | Status: DC

## 2022-03-02 ENCOUNTER — Other Ambulatory Visit: Payer: Self-pay | Admitting: Family Medicine

## 2022-03-02 DIAGNOSIS — E0590 Thyrotoxicosis, unspecified without thyrotoxic crisis or storm: Secondary | ICD-10-CM

## 2022-03-02 LAB — THYROTROPIN RECEPTOR AUTOABS: Thyrotropin Receptor Ab: 20.9 IU/L — ABNORMAL HIGH (ref 0.00–1.75)

## 2022-03-02 LAB — SPECIMEN STATUS REPORT

## 2022-03-03 ENCOUNTER — Other Ambulatory Visit: Payer: Self-pay | Admitting: Obstetrics and Gynecology

## 2022-03-03 NOTE — Patient Instructions (Signed)
Hi Ms. Goodloe, I am so sorry to have missed you today, I hope you are doing okay - as a part of your Medicaid benefit, you are eligible for care management and care coordination services at no cost or copay. I was unable to reach you by phone today but would be happy to help you with your health related needs. Please feel free to call me at [phone].  A member of the Managed Medicaid care management team will reach out to you again over the next 30 business  days.   [name] RN, BSN [place]  [facility] - Managed Medicaid High Risk [phone]

## 2022-03-03 NOTE — Patient Outreach (Signed)
Care Coordination  03/03/2022  [name] [name] 04-22-1961 [id number]   Medicaid Managed Care   Unsuccessful Outreach Note  03/03/2022 Name: [name] [name] MRN: [id number] DOB: 09/21/1960  Referred by: [name], MD Reason for referral : High Risk Managed Medicaid (Unsuccessful telephone outreach)   An unsuccessful telephone outreach was attempted today. The patient was referred to the case management team for assistance with care management and care coordination.   Follow Up Plan: The care management team will reach out to the patient again over the next 30 business  days.   [name] RN, BSN [place]  [facility] - Managed Medicaid High Risk [phone].

## 2022-03-04 DIAGNOSIS — Z961 Presence of intraocular lens: Secondary | ICD-10-CM | POA: Diagnosis not present

## 2022-03-04 DIAGNOSIS — E113513 Type 2 diabetes mellitus with proliferative diabetic retinopathy with macular edema, bilateral: Secondary | ICD-10-CM | POA: Diagnosis not present

## 2022-03-04 DIAGNOSIS — H4313 Vitreous hemorrhage, bilateral: Secondary | ICD-10-CM | POA: Diagnosis not present

## 2022-03-04 DIAGNOSIS — Z7984 Long term (current) use of oral hypoglycemic drugs: Secondary | ICD-10-CM | POA: Diagnosis not present

## 2022-03-04 DIAGNOSIS — E113511 Type 2 diabetes mellitus with proliferative diabetic retinopathy with macular edema, right eye: Secondary | ICD-10-CM | POA: Diagnosis not present

## 2022-03-04 DIAGNOSIS — Z794 Long term (current) use of insulin: Secondary | ICD-10-CM | POA: Diagnosis not present

## 2022-03-08 ENCOUNTER — Telehealth: Payer: Self-pay | Admitting: Family Medicine

## 2022-03-08 NOTE — Telephone Encounter (Signed)
Cb- [phone] Pt is calling back for lab results

## 2022-03-08 NOTE — Telephone Encounter (Signed)
Pt given lab results per notes of Dr. Margarita Rana on 03/02/22. Pt verbalized understanding. She says the only surgery she's having is a breast reduction. I advised to hold off on that until she's evaluated by endocrinologist, Dr. Chalmers Cater, phone number provided, advised to call the office in the morning for an appointment.    I have referred you to Endocrine due to your abnormal thyroid labs which is suspicious for Graves disease. Until this is fully evaluated your anticipated surgery will need to be on hold.  Written by [name], MD on 03/02/2022  8:48 AM EDT

## 2022-03-09 ENCOUNTER — Other Ambulatory Visit (HOSPITAL_COMMUNITY): Payer: Self-pay | Admitting: Emergency Medicine

## 2022-03-09 NOTE — Progress Notes (Signed)
Paramedicine Encounter    Patient ID: [name] [name], female    DOB: [birth year], 61 y.o.   MRN: [id number]   LMP 04/01/2010  Weight yesterday-not taken Last visit weight-192 lbs   [id number]  ATF [name] [name] A&O x 4, skin W&D w/ good color.  Pt reports to be feeling well.  No chest pain or SOB.  No edema to her lower extremities. No obvious JVD.  Lung sounds clear and equal bilat.  She is down 8lbs from my last home visit with her 8/1.  [name] [name] reports that she went out of town for the weekend and forgot her pill box.  She missed Fri, Sat and Sun med regimen.  She reports to be back on track since Monday.  Med box reconciled.  She started a new med (Methimazole) for hyperthyroidism.   Refills called in for: Amlodopine, Atorvastatin, Jardiance, Pregabalin, Hydralazine, Oxybutin and Semiglutide which will be delivered on Monday.  [name] [name] has been referred to Dr. Chalmers Cater, an Endocrinologist and she asked me to assist her with making an appointment with same.  I called Dr. Almetta Lovely office and they advised she would have to be referred by Dr. Margarita Rana before an appointment could be scheduled.  Called Dr. Smitty Pluck office to follow-up regarding referral and they advised they would send the appropriate information.  Dr. Almetta Lovely office advised once the information has been received they would call [name] [name] with a date and time for her appointment. Home visit complete.  Patient Care Team: [name], MD as PCP - General (Family Medicine) Minus Breeding, MD as PCP - Cardiology (Cardiology) Craft, Lorel Monaco, RN as Case Manager [facility], Tami Lin, [place] as Physician Assistant (Cardiology)  Patient Active Problem List   Diagnosis Date Noted   Acute on chronic heart failure with preserved ejection fraction (HFpEF) ([place]) 09/22/2021   Heart failure ([place]) 09/22/2021   CKD (chronic kidney disease) stage 4, GFR 15-29 ml/min ([place]) 07/03/2021   Acute respiratory failure due to COVID-19 ([place]) 05/06/2021    COVID-19 05/05/2021   CHF exacerbation ([place]) 04/02/2021   Chest pain 04/02/2021   Acute-on-chronic kidney injury ([place]) 04/02/2021   COPD exacerbation ([place]) 12/05/2020   CKD (chronic kidney disease), stage III ([place]) 12/04/2020   Anemia of chronic disease 12/04/2020   Obesity, Class III, BMI 40-49.9 (morbid obesity) ([place]) 07/27/2020   Acute respiratory disease due to COVID-19 virus 07/25/2020   Acute hypoxemic respiratory failure due to COVID-19 ([place]) 07/25/2020   Morbid obesity ([place]) 11/23/2019   Acute respiratory failure with hypoxia ([place]) 11/06/2019   Chronic kidney disease, stage 3b ([place]) 11/06/2019   COPD without exacerbation ([place])    Acute kidney failure ([place]) 08/18/2019   Left ventricular hypertrophy 07/21/2019   Educated about COVID-19 virus infection 21/30/8657   Acute diastolic HF (heart failure) (HCC)    Hypoxia    OSA (obstructive sleep apnea) 05/09/2019   Hyperglycemia 04/09/2019   Snoring 03/13/2019   Chronic anemia 08/17/2018   Chronic cystitis 08/03/2018   Diabetic neuropathy ([place]) 06/16/2017   Non compliance w medication regimen 04/12/2017   Family history of colon cancer in mother 11/17/2016   Dyspareunia in female 11/11/2016   Screen for colon cancer 11/11/2016   History of ovarian cyst 11/11/2016   Chronic hepatitis C without hepatic coma ([place]) 11/09/2016   Hyperlipidemia 10/07/2016   Type 2 diabetes mellitus with hyperlipidemia ([place]) 01/29/2014   Status post intraocular lens implant 11/08/2013   PCO (posterior capsular opacification) 09/28/2013   Pseudophakia 09/12/2013  GERD (gastroesophageal reflux disease) 09/06/2013   Hypertension 09/06/2013   ANEMIA, IRON DEFICIENCY 10/03/2009   LIPOMA 06/18/2009   TINEA PEDIS 05/07/2009   SUBSTANCE ABUSE, MULTIPLE 02/22/2007   HCVD (hypertensive cardiovascular disease) 02/22/2007    Current Outpatient Medications:    albuterol (VENTOLIN HFA) 108 (90 Base) MCG/ACT inhaler, Inhale 1 puff into the lungs every 4  (four) hours as needed for wheezing or shortness of breath., Disp: 18 g, Rfl: 0   amLODipine (NORVASC) 2.5 MG tablet, Take 1 tablet (2.5 mg total) by mouth daily., Disp: 90 tablet, Rfl: 1   atorvastatin (LIPITOR) 40 MG tablet, Take 1 tablet (40 mg total) by mouth every morning., Disp: 90 tablet, Rfl: 1   carvedilol (COREG) 25 MG tablet, Take 1 tablet (25 mg total) by mouth 2 (two) times daily., Disp: 180 tablet, Rfl: 1   empagliflozin (JARDIANCE) 10 MG TABS tablet, Take 1 tablet (10 mg total) by mouth daily., Disp: 90 tablet, Rfl: 1   famotidine (PEPCID) 40 MG tablet, Take 1 tablet (40 mg total) by mouth daily., Disp: 30 tablet, Rfl: 3   ferrous sulfate 325 (65 FE) MG tablet, Take 325 mg by mouth daily with breakfast., Disp: , Rfl:    hydrALAZINE (APRESOLINE) 50 MG tablet, Take 1 tablet (50 mg total) by mouth 3 (three) times daily., Disp: 270 tablet, Rfl: 1   methimazole (TAPAZOLE) 5 MG tablet, Take 1 tablet (5 mg total) by mouth 3 (three) times daily., Disp: 90 tablet, Rfl: 1   mometasone-formoterol (DULERA) 100-5 MCG/ACT AERO, Inhale 2 puffs into the lungs 2 (two) times daily., Disp: 1 each, Rfl: 0   nystatin ointment (MYCOSTATIN), Apply 1 application. topically 2 (two) times daily as needed (rash/skin irritation). (Patient not taking: Reported on 02/16/2022), Disp: 30 g, Rfl: 1   oxybutynin (DITROPAN) 5 MG tablet, Take 1 tablet (5 mg total) by mouth 2 (two) times daily., Disp: 60 tablet, Rfl: 6   potassium chloride SA (KLOR-CON M) 20 MEQ tablet, Take 2 tablets (40 mEq total) by mouth once for 1 dose. (Patient not taking: Reported on 12/31/2021), Disp: 2 tablet, Rfl: 0   pregabalin (LYRICA) 75 MG capsule, Take 1 capsule (75 mg total) by mouth 2 (two) times daily., Disp: 180 capsule, Rfl: 1   Semaglutide,0.25 or 0.'5MG'$ /DOS, (OZEMPIC, 0.25 OR 0.5 MG/DOSE,) 2 MG/1.5ML SOPN, Inject 0.5 mg into the skin once a week., Disp: 2 mL, Rfl: 6   spironolactone (ALDACTONE) 25 MG tablet, Take 0.5 tablets (12.5 mg  total) by mouth daily., Disp: 30 tablet, Rfl: 3   torsemide (DEMADEX) 20 MG tablet, Take 3 tablets (60 mg total) by mouth daily., Disp: 90 tablet, Rfl: 6 Allergies  Allergen Reactions   Lisinopril Swelling      Social History   Socioeconomic History   Marital status: Legally Separated    Spouse name: Not on file   Number of children: 1   Years of education: Not on file   Highest education level: 9th grade  Occupational History   Occupation: disability  Tobacco Use   Smoking status: Never   Smokeless tobacco: Never  Vaping Use   Vaping Use: Never used  Substance and Sexual Activity   Alcohol use: No   Drug use: Not Currently    Types: Cocaine, Marijuana    Comment: remote h/o cocaine 2015 and marijuana 2018   Sexual activity: Not Currently  Other Topics Concern   Not on file  Social History Narrative   Lives with friend, Rosalee Kaufman and  nephew.  One daughter.    Social Determinants of Health   Financial Resource Strain: Low Risk  (12/29/2021)   Overall Financial Resource Strain (CARDIA)    Difficulty of Paying Living Expenses: Not very hard  Food Insecurity: No Food Insecurity (11/26/2021)   Hunger Vital Sign    Worried About Running Out of Food in the Last Year: Never true    Ran Out of Food in the Last Year: Never true  Transportation Needs: No Transportation Needs (11/26/2021)   PRAPARE - Hydrologist (Medical): No    Lack of Transportation (Non-Medical): No  Recent Concern: Transportation Needs - Unmet Transportation Needs (08/31/2021)   PRAPARE - Transportation    Lack of Transportation (Medical): No    Lack of Transportation (Non-Medical): Yes  Physical Activity: Insufficiently Active (02/04/2022)   Exercise Vital Sign    Days of Exercise per Week: 1 day    Minutes of Exercise per Session: 30 min  Stress: No Stress Concern Present (09/21/2021)   [place]    Feeling of  Stress : Not at all  Social Connections: Moderately Integrated (12/29/2021)   Social Connection and Isolation Panel [NHANES]    Frequency of Communication with Friends and Family: More than three times a week    Frequency of Social Gatherings with Friends and Family: More than three times a week    Attends Religious Services: More than 4 times per year    Active Member of [employer] or Organizations: No    Attends Archivist Meetings: Never    Marital Status: Living with partner  Intimate Partner Violence: Not At Risk (02/04/2022)   Humiliation, Afraid, Rape, and Kick questionnaire    Fear of Current or Ex-Partner: No    Emotionally Abused: No    Physically Abused: No    Sexually Abused: No    Physical Exam      Future Appointments  Date Time Provider [place]  03/16/2022 10:00 AM Larey Dresser, MD [facility] None  03/23/2022  1:40 PM Minus Breeding, MD [facility]  04/13/2022 12:30 PM THN CCC-MM CARE MANAGER 2 THN-CCC None  05/31/2022 11:10 AM [name], MD CHW-[facility] None       [name], [place] [facility] Paramedic  03/09/22

## 2022-03-15 ENCOUNTER — Other Ambulatory Visit: Payer: Self-pay | Admitting: Family Medicine

## 2022-03-15 DIAGNOSIS — E1149 Type 2 diabetes mellitus with other diabetic neurological complication: Secondary | ICD-10-CM

## 2022-03-15 DIAGNOSIS — I110 Hypertensive heart disease with heart failure: Secondary | ICD-10-CM

## 2022-03-16 ENCOUNTER — Ambulatory Visit (HOSPITAL_COMMUNITY)
Admission: RE | Admit: 2022-03-16 | Discharge: 2022-03-16 | Disposition: A | Discharge status: Home / Self Care | Payer: Medicare Other | Source: Ambulatory Visit | Attending: Cardiology | Admitting: Cardiology

## 2022-03-16 ENCOUNTER — Other Ambulatory Visit (HOSPITAL_COMMUNITY): Payer: Self-pay | Admitting: Emergency Medicine

## 2022-03-16 ENCOUNTER — Encounter (HOSPITAL_COMMUNITY): Payer: Self-pay | Admitting: Cardiology

## 2022-03-16 ENCOUNTER — Encounter (HOSPITAL_COMMUNITY): Payer: Medicare Other | Admitting: Cardiology

## 2022-03-16 VITALS — BP 150/84 | HR 77 | Wt 193.0 lb

## 2022-03-16 DIAGNOSIS — I5032 Chronic diastolic (congestive) heart failure: Secondary | ICD-10-CM | POA: Diagnosis not present

## 2022-03-16 DIAGNOSIS — I5022 Chronic systolic (congestive) heart failure: Secondary | ICD-10-CM

## 2022-03-16 LAB — BASIC METABOLIC PANEL
Anion gap: 8 (ref 5–15)
BUN: 55 mg/dL — ABNORMAL HIGH (ref 8–23)
CO2: 26 mmol/L (ref 22–32)
Calcium: 9.4 mg/dL (ref 8.9–10.3)
Chloride: 105 mmol/L (ref 98–111)
Creatinine, Ser: 2.01 mg/dL — ABNORMAL HIGH (ref 0.44–1.00)
GFR, Estimated: 28 mL/min — ABNORMAL LOW (ref >60)
Glucose, Bld: 148 mg/dL — ABNORMAL HIGH (ref 70–99)
Potassium: 4.2 mmol/L (ref 3.5–5.1)
Sodium: 139 mmol/L (ref 135–145)

## 2022-03-16 LAB — BRAIN NATRIURETIC PEPTIDE: B Natriuretic Peptide: 35.6 pg/mL (ref 0.0–100.0)

## 2022-03-16 NOTE — Telephone Encounter (Signed)
Requested medication (s) are due for refill today: no  Requested medication (s) are on the active medication list:yes  Last refill:  11/02/21  Future visit scheduled:yes  Notes to clinic:  Unable to refill per protocol, cannot delegate Lyrica. Refill too soon for hydralazine, last RF 11/02/21, 90 and 1 RF. Routing for review.     Requested Prescriptions  Pending Prescriptions Disp Refills   hydrALAZINE (APRESOLINE) 50 MG tablet [Pharmacy Med Name: hydralazine 50 mg tablet] 270 tablet 1    Sig: TAKE ONE TABLET BY MOUTH THREE TIMES DAILY     Cardiovascular:  Vasodilators Failed - 03/15/2022  8:54 AM      Failed - HCT in normal range and within 360 days    HCT  Date Value Ref Range Status  09/25/2021 26.3 (L) 36.0 - 46.0 % Final   Hematocrit  Date Value Ref Range Status  07/10/2019 30.9 (L) 34.0 - 46.6 % Final         Failed - HGB in normal range and within 360 days    Hemoglobin  Date Value Ref Range Status  09/25/2021 8.0 (L) 12.0 - 15.0 g/dL Final  07/10/2019 9.8 (L) 11.1 - 15.9 g/dL Final         Failed - RBC in normal range and within 360 days    RBC  Date Value Ref Range Status  09/25/2021 3.11 (L) 3.87 - 5.11 MIL/uL Final         Failed - WBC in normal range and within 360 days    WBC  Date Value Ref Range Status  09/25/2021 3.6 (L) 4.0 - 10.5 K/uL Final         Failed - ANA Screen, Ifa, Serum in normal range and within 360 days    No results found for: "ANA", "ANATITER", "LABANTI"       Passed - PLT in normal range and within 360 days    Platelets  Date Value Ref Range Status  09/25/2021 168 150 - 400 K/uL Final  07/10/2019 171 150 - 450 x10E3/uL Final         Passed - Last BP in normal range    BP Readings from Last 1 Encounters:  03/09/22 118/80         Passed - Valid encounter within last 12 months    Recent Outpatient Visits           1 month ago 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 ([place])   [place], [place], MD   4 months ago Type 2 diabetes mellitus with other neurologic complication, with long-term current use of insulin ([place])   [place], Enobong, MD   5 months ago Hospital discharge follow-up   [place], Broadus John, FNP   5 months ago Acute on chronic congestive heart failure, unspecified heart failure type [facility])   Primary Care at [facility] [place], Kriste Basque, NP   6 months ago Type 2 diabetes mellitus with other neurologic complication, with long-term current use of insulin [facility])   [place], Enobong, MD       Future Appointments             In 1 week Minus Breeding, MD [place] [facility]   In 2 months [name], MD [facility]             pregabalin (LYRICA) 75 MG capsule [Pharmacy Med Name: pregabalin 75 mg capsule] 180 capsule 1    Sig: TAKE ONE CAPSULE BY MOUTH TWICE DAILY     Not Delegated - Neurology:  Anticonvulsants - Controlled - pregabalin Failed - 03/15/2022  8:54 AM      Failed - This refill cannot be delegated      Failed - Cr in normal range and within 360 days    Creat  Date Value Ref Range Status  12/12/2012 0.88 0.50 - 1.10 mg/dL Final   Creatinine, Ser  Date Value Ref Range Status  12/21/2021 1.78 (H) 0.57 - 1.00 mg/dL Final   Creatinine,U  Date Value Ref Range Status  06/13/2007 45.3 mg/dL Final    Comment:    See lab report for associated comment(s)   Creatinine, Urine  Date Value Ref Range Status  09/15/2021 47 mg/dL Final    Comment:    Performed at [facility], [address], [place], [place] [address]         Passed - Completed PHQ-2 or PHQ-9 in the last 360 days      Passed - Valid encounter within last 12 months    Recent Outpatient Visits           1 month ago  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 ([place])   [place], Zachary, MD   4 months ago Type 2 diabetes mellitus with other neurologic complication, with long-term current use of insulin ([place])   [place], Enobong, MD   5 months ago Hospital discharge follow-up   [place], Broadus John, FNP   5 months ago Acute on chronic congestive heart failure, unspecified heart failure type [facility])   Primary Care at [facility], Kriste Basque, NP   6 months ago Type 2 diabetes mellitus with other neurologic complication, with long-term current use of insulin [facility])   [place], Enobong, MD       Future Appointments             In 1 week Minus Breeding, MD [place]   In 2 months [name], MD [place]

## 2022-03-16 NOTE — Progress Notes (Signed)
Paramedicine Encounter    Patient ID: [name] [name], female    DOB: [birth year], 61 y.o.   MRN: [id number]   LMP 04/01/2010  Weight yesterday-not taken  Last visit weight-scale broken  ATF Ms. Poche CA&O x 4, skin W&D w/ good color.  [name] @ 10:00 this morning but forgot same and called me to do home visit.  Pt reports to be feeling "pretty good"  She has done well being compliant with her medications.  Upon beginning med box reconciliation, pt was missing quite a few meds to complete box.  These refills were called in to [facility] on my last visit but were never delivered.  Contacted pharmacy and requested delivery of meds be done today.  She has enough to last her till Friday and I will go back out and complete box at that time.  I advised her to contact me when meds are delivered.  Pt denies chest pain or SOB.  No edema noted.  Lung sounds clear and equal bilat.  Pt was able to reschedule her HF clinic appointment for later this afternoon which I will not be able to attend w/ her due to other scheduled home visits.  Visit complete.    [name], [place] 03/16/2022    Patient Care Team: [name], MD as PCP - General (Family Medicine) Minus Breeding, MD as PCP - Cardiology (Cardiology) Craft, Lorel Monaco, RN as Case Manager [facility], Tami Lin, [place] as Physician Assistant (Cardiology)  Patient Active Problem List   Diagnosis Date Noted   Acute on chronic heart failure with preserved ejection fraction (HFpEF) ([place]) 09/22/2021   Heart failure ([place]) 09/22/2021   CKD (chronic kidney disease) stage 4, GFR 15-29 ml/min ([place]) 07/03/2021   Acute respiratory failure due to COVID-19 ([place]) 05/06/2021   COVID-19 05/05/2021   CHF exacerbation ([place]) 04/02/2021   Chest pain 04/02/2021   Acute-on-chronic kidney injury ([place]) 04/02/2021   COPD exacerbation ([place]) 12/05/2020   CKD (chronic kidney disease), stage III ([place]) 12/04/2020   Anemia of  chronic disease 12/04/2020   Obesity, Class III, BMI 40-49.9 (morbid obesity) ([place]) 07/27/2020   Acute respiratory disease due to COVID-19 virus 07/25/2020   Acute hypoxemic respiratory failure due to COVID-19 ([place]) 07/25/2020   Morbid obesity ([place]) 11/23/2019   Acute respiratory failure with hypoxia ([place]) 11/06/2019   Chronic kidney disease, stage 3b ([place]) 11/06/2019   COPD without exacerbation ([place])    Acute kidney failure ([place]) 08/18/2019   Left ventricular hypertrophy 07/21/2019   Educated about COVID-19 virus infection 63/07/6008   Acute diastolic HF (heart failure) (HCC)    Hypoxia    OSA (obstructive sleep apnea) 05/09/2019   Hyperglycemia 04/09/2019   Snoring 03/13/2019   Chronic anemia 08/17/2018   Chronic cystitis 08/03/2018   Diabetic neuropathy ([place]) 06/16/2017   Non compliance w medication regimen 04/12/2017   Family history of colon cancer in mother 11/17/2016   Dyspareunia in female 11/11/2016   Screen for colon cancer 11/11/2016   History of ovarian cyst 11/11/2016   Chronic hepatitis C without hepatic coma ([place]) 11/09/2016   Hyperlipidemia 10/07/2016   Type 2 diabetes mellitus with hyperlipidemia ([place]) 01/29/2014   Status post intraocular lens implant 11/08/2013   PCO (posterior capsular opacification) 09/28/2013   Pseudophakia 09/12/2013   GERD (gastroesophageal reflux disease) 09/06/2013   Hypertension 09/06/2013   ANEMIA, IRON DEFICIENCY 10/03/2009   LIPOMA 06/18/2009   TINEA PEDIS 05/07/2009   SUBSTANCE ABUSE, MULTIPLE 02/22/2007  HCVD (hypertensive cardiovascular disease) 02/22/2007    Current Outpatient Medications:    albuterol (VENTOLIN HFA) 108 (90 Base) MCG/ACT inhaler, Inhale 1 puff into the lungs every 4 (four) hours as needed for wheezing or shortness of breath., Disp: 18 g, Rfl: 0   amLODipine (NORVASC) 2.5 MG tablet, Take 1 tablet (2.5 mg total) by mouth daily., Disp: 90 tablet, Rfl: 1   atorvastatin (LIPITOR) 40 MG tablet, Take 1 tablet (40  mg total) by mouth every morning., Disp: 90 tablet, Rfl: 1   carvedilol (COREG) 25 MG tablet, Take 1 tablet (25 mg total) by mouth 2 (two) times daily., Disp: 180 tablet, Rfl: 1   empagliflozin (JARDIANCE) 10 MG TABS tablet, Take 1 tablet (10 mg total) by mouth daily., Disp: 90 tablet, Rfl: 1   famotidine (PEPCID) 40 MG tablet, Take 1 tablet (40 mg total) by mouth daily., Disp: 30 tablet, Rfl: 3   ferrous sulfate 325 (65 FE) MG tablet, Take 325 mg by mouth daily with breakfast., Disp: , Rfl:    hydrALAZINE (APRESOLINE) 50 MG tablet, Take 1 tablet (50 mg total) by mouth 3 (three) times daily., Disp: 270 tablet, Rfl: 1   methimazole (TAPAZOLE) 5 MG tablet, Take 1 tablet (5 mg total) by mouth 3 (three) times daily., Disp: 90 tablet, Rfl: 1   mometasone-formoterol (DULERA) 100-5 MCG/ACT AERO, Inhale 2 puffs into the lungs 2 (two) times daily., Disp: 1 each, Rfl: 0   nystatin ointment (MYCOSTATIN), Apply 1 application. topically 2 (two) times daily as needed (rash/skin irritation). (Patient not taking: Reported on 02/16/2022), Disp: 30 g, Rfl: 1   oxybutynin (DITROPAN) 5 MG tablet, Take 1 tablet (5 mg total) by mouth 2 (two) times daily., Disp: 60 tablet, Rfl: 6   potassium chloride SA (KLOR-CON M) 20 MEQ tablet, Take 2 tablets (40 mEq total) by mouth once for 1 dose. (Patient not taking: Reported on 12/31/2021), Disp: 2 tablet, Rfl: 0   pregabalin (LYRICA) 75 MG capsule, Take 1 capsule (75 mg total) by mouth 2 (two) times daily., Disp: 180 capsule, Rfl: 1   Semaglutide,0.25 or 0.'5MG'$ /DOS, (OZEMPIC, 0.25 OR 0.5 MG/DOSE,) 2 MG/1.5ML SOPN, Inject 0.5 mg into the skin once a week., Disp: 2 mL, Rfl: 6   spironolactone (ALDACTONE) 25 MG tablet, Take 0.5 tablets (12.5 mg total) by mouth daily., Disp: 30 tablet, Rfl: 3   torsemide (DEMADEX) 20 MG tablet, Take 3 tablets (60 mg total) by mouth daily., Disp: 90 tablet, Rfl: 6 Allergies  Allergen Reactions   Lisinopril Swelling      Social History    Socioeconomic History   Marital status: Legally Separated    Spouse name: Not on file   Number of children: 1   Years of education: Not on file   Highest education level: 9th grade  Occupational History   Occupation: disability  Tobacco Use   Smoking status: Never   Smokeless tobacco: Never  Vaping Use   Vaping Use: Never used  Substance and Sexual Activity   Alcohol use: No   Drug use: Not Currently    Types: Cocaine, Marijuana    Comment: remote h/o cocaine 2015 and marijuana 2018   Sexual activity: Not Currently  Other Topics Concern   Not on file  Social History Narrative   Lives with friend, Rosalee Kaufman and nephew.  One daughter.    Social Determinants of Health   Financial Resource Strain: Low Risk  (12/29/2021)   Overall Financial Resource Strain (CARDIA)    Difficulty of  Paying Living Expenses: Not very hard  Food Insecurity: No Food Insecurity (11/26/2021)   Hunger Vital Sign    Worried About Running Out of Food in the Last Year: Never true    Ran Out of Food in the Last Year: Never true  Transportation Needs: No Transportation Needs (11/26/2021)   PRAPARE - Hydrologist (Medical): No    Lack of Transportation (Non-Medical): No  Recent Concern: Transportation Needs - Unmet Transportation Needs (08/31/2021)   PRAPARE - Transportation    Lack of Transportation (Medical): No    Lack of Transportation (Non-Medical): Yes  Physical Activity: Insufficiently Active (02/04/2022)   Exercise Vital Sign    Days of Exercise per Week: 1 day    Minutes of Exercise per Session: 30 min  Stress: No Stress Concern Present (09/21/2021)   [place]    Feeling of Stress : Not at all  Social Connections: Moderately Integrated (12/29/2021)   Social Connection and Isolation Panel [NHANES]    Frequency of Communication with Friends and Family: More than three times a week    Frequency of Social  Gatherings with Friends and Family: More than three times a week    Attends Religious Services: More than 4 times per year    Active Member of [employer] or Organizations: No    Attends Archivist Meetings: Never    Marital Status: Living with partner  Intimate Partner Violence: Not At Risk (02/04/2022)   Humiliation, Afraid, Rape, and Kick questionnaire    Fear of Current or Ex-Partner: No    Emotionally Abused: No    Physically Abused: No    Sexually Abused: No    Physical Exam      Future Appointments  Date Time Provider [place]  03/16/2022  2:20 PM Larey Dresser, MD [facility] None  03/23/2022  1:40 PM Minus Breeding, MD [facility] None  04/13/2022 12:30 PM THN CCC-MM CARE MANAGER 2 THN-CCC None  05/31/2022 11:10 AM [name], MD CHW-[facility] None       [name], [place] [facility] Paramedic  03/16/22

## 2022-03-16 NOTE — Patient Instructions (Addendum)
There has been no changes to your medications.  Labs done today, your results will be available in MyChart, we will contact you for abnormal readings.  You have been ordered a PYP Scan.  This is done in the Radiology Department of [facility].  When you come for this test please plan to be there 2-3 hours. Once approved by your insurance company. You will be called to have it scheduled.   You provider has ordered a PET SCAN for you at [facility].  Once approved by your insurance company. You will be called to schedule the test.   How to Prepare for Your Cardiac PET/CT Stress Test:  1. Please do not take these medications before your test:   Medications that may interfere with the cardiac pharmacological stress agent (ex. nitrates - including erectile dysfunction medications or beta-blockers) the day of the exam. (Erectile dysfunction medication should be held for at least 72 hrs prior to test) Theophylline containing medications for 12 hours. Dipyridamole 48 hours prior to the test. Your remaining medications may be taken with water.  2. Nothing to eat or drink, except water, 3 hours prior to arrival time.   NO caffeine/decaffeinated products, or chocolate 12 hours prior to arrival.  3. NO perfume, cologne or lotion  4. Total time is 1 to 2 hours; you may want to bring reading material for the waiting time.  5. Please report to Admitting at the [facility] Main Entrance 60 minutes early for your test.  [place], [place] [address]  Diabetic Preparation:  Hold oral medications. You may take NPH and Lantus insulin. Do not take Humalog or Humulin R (Regular Insulin) the day of your test. Check blood sugars prior to leaving the house. If able to eat breakfast prior to 3 hour fasting, you may take all medications, including your insulin, Do not worry if you miss your breakfast dose of insulin - start at your next meal.  IF YOU THINK YOU MAY  BE PREGNANT, OR ARE NURSING PLEASE INFORM THE TECHNOLOGIST.  In preparation for your appointment, medication and supplies will be purchased.  Appointment availability is limited, so if you need to cancel or reschedule, please call the Radiology Department at [phone]  24 hours in advance to avoid a cancellation fee of $100.00  What to Expect After you Arrive:  Once you arrive and check in for your appointment, you will be taken to a preparation room within the Radiology Department.  A technologist or Nurse will obtain your medical history, verify that you are correctly prepped for the exam, and explain the procedure.  Afterwards,  an IV will be started in your arm and electrodes will be placed on your skin for EKG monitoring during the stress portion of the exam. Then you will be escorted to the PET/CT scanner.  There, staff will get you positioned on the scanner and obtain a blood pressure and EKG.  During the exam, you will continue to be connected to the EKG and blood pressure machines.  A small, safe amount of a radioactive tracer will be injected in your IV to obtain a series of pictures of your heart along with an injection of a stress agent.    After your Exam:  It is recommended that you eat a meal and drink a caffeinated beverage to counter act any effects of the stress agent.  Drink plenty of fluids for the remainder of the day and urinate frequently for  the first couple of hours after the exam.  Your doctor will inform you of your test results within 7-10 business days.  For questions about your test or how to prepare for your test, please call: Marchia Bond, Cardiac Imaging Nurse Navigator  Gordy Clement, Cardiac Imaging Nurse Navigator Office: [phone]   Your physician recommends that you schedule a follow-up appointment in: 3 months  If you have any questions or concerns before your next appointment please send [place] a message through [facility] or call our office at  [phone].    TO LEAVE A MESSAGE FOR THE NURSE SELECT OPTION 2, PLEASE LEAVE A MESSAGE INCLUDING: YOUR NAME DATE OF BIRTH CALL BACK NUMBER REASON FOR CALL**this is important as we prioritize the call backs  YOU WILL RECEIVE A CALL BACK THE SAME DAY AS LONG AS YOU CALL BEFORE 4:00 PM  At the [facility], you and your health needs are our priority. As part of our continuing mission to provide you with exceptional heart care, we have created designated Provider Care Teams. These Care Teams include your primary Cardiologist (physician) and Advanced Practice Providers (APPs- Physician Assistants and Nurse Practitioners) who all work together to provide you with the care you need, when you need it.   You may see any of the following providers on your designated Care Team at your next follow up: Dr Glori Bickers Dr Loralie Champagne Dr. Roxana Hires, NP Lyda Jester, [place] [facility] [place], [place] Forestine Na, NP Audry Riles, PharmD   Please be sure to bring in all your medications bottles to every appointment.

## 2022-03-17 ENCOUNTER — Telehealth (HOSPITAL_COMMUNITY): Payer: Self-pay | Admitting: *Deleted

## 2022-03-17 DIAGNOSIS — I5022 Chronic systolic (congestive) heart failure: Secondary | ICD-10-CM

## 2022-03-17 DIAGNOSIS — Z79899 Other long term (current) drug therapy: Secondary | ICD-10-CM

## 2022-03-17 NOTE — Telephone Encounter (Signed)
Called patient per Dr. Aundra Dubin and instructed her to decrease Torsemide to 40 mg (2 pills) daily with repeat lab on Monday, 03/29/22 at 9:30. Pt verbalized understanding of same.

## 2022-03-17 NOTE — Progress Notes (Signed)
Advanced Heart Failure Clinic Note    PCP: [name], MD PCP-Cardiologist: Minus Breeding, MD  HF Cardiologist: Dr. Aundra Dubin  HPI:  [name] [name] is a 61 y.o. with a history of HFpEF, DMII, HTN, OSA, and hyperlipidemia.    Had sleep study 08/18/21 that demonstrated mild obstructive sleep apnea overall (AHI 13.0/h; RDI 14.6/h); however, sleep apnea was moderately severe during REM sleep (AHI 29.4/h. Severe oxygen desaturation was noted during this study (Min O2 54.0%). Time spent < 89% was 81.7 minutes.   Admitted 09/15/21 with decompensated CHF. Diuresed with IV lasix and transitioned to torsemide 40 mg daily.    Admitted 09/22/21 with with decompensated HFpEF. Diuresed with IV lasix and transitioned to torsemide 20 mg daily, weight 233 lbs.  Referred to [facility] clinic. Post hospital follow up 3/23, she was volume overloaded in setting of dietary indiscretion, ReDs 47%. Torsemide increased and CardioMEMs discussed.  Echo in 3/23 showed EF 60-65%, normal RV.   Had HF f/u 12/01/21. Reds 48%. Torsemide increased to 60 mg twice a day x 2 days then 60 mg daily. Arlyce Harman was started.   She returns for followup of CHF.  She reports occasional tightness in her chest, usually when walking.  Her breathing is doing better on torsemide 60 mg daily, she is not short of breath walking up stairs now.  No dyspnea on flat ground.  No lightheadedness.  BP is mildly elevated.  Weight stable.   Labs (4/23): K 4.0, creatinine 1.84 Labs (12/22/21): K 4.2, Creatinine 1.78  Labs (7/23): LDL 45 Labs (8/23): TSH < 0.005  PMH: 1. CKD stage 3 2. HCV 3. HTN: Angioedema with ACEI.  4. Type 2 diabetes 5. OSA 6. Hyperthyroidism 7. Chronic diastolic CHF: Echo in 5/88 with EF 60-65%, normal RV.    Current Outpatient Medications  Medication Sig Dispense Refill   albuterol (VENTOLIN HFA) 108 (90 Base) MCG/ACT inhaler Inhale 1 puff into the lungs every 4 (four) hours as needed for wheezing or shortness of breath. 18 g 0    amLODipine (NORVASC) 2.5 MG tablet Take 1 tablet (2.5 mg total) by mouth daily. 90 tablet 1   atorvastatin (LIPITOR) 40 MG tablet Take 1 tablet (40 mg total) by mouth every morning. 90 tablet 1   carvedilol (COREG) 25 MG tablet Take 1 tablet (25 mg total) by mouth 2 (two) times daily. 180 tablet 1   empagliflozin (JARDIANCE) 10 MG TABS tablet Take 1 tablet (10 mg total) by mouth daily. 90 tablet 1   famotidine (PEPCID) 40 MG tablet Take 1 tablet (40 mg total) by mouth daily. 30 tablet 3   ferrous sulfate 325 (65 FE) MG tablet Take 325 mg by mouth daily with breakfast.     methimazole (TAPAZOLE) 5 MG tablet Take 1 tablet (5 mg total) by mouth 3 (three) times daily. 90 tablet 1   mometasone-formoterol (DULERA) 100-5 MCG/ACT AERO Inhale 2 puffs into the lungs as needed for wheezing.     nystatin ointment (MYCOSTATIN) Apply 1 application. topically 2 (two) times daily as needed (rash/skin irritation). 30 g 1   oxybutynin (DITROPAN) 5 MG tablet Take 1 tablet (5 mg total) by mouth 2 (two) times daily. 60 tablet 6   pregabalin (LYRICA) 75 MG capsule Take 1 capsule (75 mg total) by mouth 2 (two) times daily. 180 capsule 1   Semaglutide,0.25 or 0.'5MG'$ /DOS, (OZEMPIC, 0.25 OR 0.5 MG/DOSE,) 2 MG/1.5ML SOPN Inject 0.5 mg into the skin once a week. 2 mL 6   spironolactone (ALDACTONE)  25 MG tablet Take 0.5 tablets (12.5 mg total) by mouth daily. 30 tablet 3   torsemide (DEMADEX) 20 MG tablet Take 3 tablets (60 mg total) by mouth daily. 90 tablet 6   hydrALAZINE (APRESOLINE) 50 MG tablet TAKE ONE TABLET BY MOUTH THREE TIMES DAILY 270 tablet 1   No current facility-administered medications for this encounter.   Allergies  Allergen Reactions   Lisinopril Swelling   Social History   Socioeconomic History   Marital status: Legally Separated    Spouse name: Not on file   Number of children: 1   Years of education: Not on file   Highest education level: 9th grade  Occupational History   Occupation:  disability  Tobacco Use   Smoking status: Never   Smokeless tobacco: Never  Vaping Use   Vaping Use: Never used  Substance and Sexual Activity   Alcohol use: No   Drug use: Not Currently    Types: Cocaine, Marijuana    Comment: remote h/o cocaine 2015 and marijuana 2018   Sexual activity: Not Currently  Other Topics Concern   Not on file  Social History Narrative   Lives with friend, [name] [name] and nephew.  One daughter.    Social Determinants of Health   Financial Resource Strain: Low Risk  (12/29/2021)   Overall Financial Resource Strain (CARDIA)    Difficulty of Paying Living Expenses: Not very hard  Food Insecurity: No Food Insecurity (11/26/2021)   Hunger Vital Sign    Worried About Running Out of Food in the Last Year: Never true    Ran Out of Food in the Last Year: Never true  Transportation Needs: No Transportation Needs (11/26/2021)   PRAPARE - Hydrologist (Medical): No    Lack of Transportation (Non-Medical): No  Recent Concern: Transportation Needs - Unmet Transportation Needs (08/31/2021)   PRAPARE - Transportation    Lack of Transportation (Medical): No    Lack of Transportation (Non-Medical): Yes  Physical Activity: Insufficiently Active (02/04/2022)   Exercise Vital Sign    Days of Exercise per Week: 1 day    Minutes of Exercise per Session: 30 min  Stress: No Stress Concern Present (09/21/2021)   [place]    Feeling of Stress : Not at all  Social Connections: Moderately Integrated (12/29/2021)   Social Connection and Isolation Panel [NHANES]    Frequency of Communication with Friends and Family: More than three times a week    Frequency of Social Gatherings with Friends and Family: More than three times a week    Attends Religious Services: More than 4 times per year    Active Member of [employer] or Organizations: No    Attends Archivist Meetings: Never     Marital Status: Living with partner  Intimate Partner Violence: Not At Risk (02/04/2022)   Humiliation, Afraid, Rape, and Kick questionnaire    Fear of Current or Ex-Partner: No    Emotionally Abused: No    Physically Abused: No    Sexually Abused: No   Family History  Problem Relation Age of Onset   Colon cancer Mother    Liver disease Sister    Other Neg Hx    Breast cancer Neg Hx    Esophageal cancer Neg Hx    Rectal cancer Neg Hx    Tremor Neg Hx    BP (!) 150/84   Pulse 77   Wt 87.5 kg (193 lb)  LMP 04/01/2010   SpO2 99%   BMI 33.13 kg/m   Wt Readings from Last 3 Encounters:  03/16/22 87.5 kg (193 lb)  03/09/22 83.5 kg (184 lb)  02/23/22 87.1 kg (192 lb)   PHYSICAL EXAM: General: NAD Neck: No JVD, no thyromegaly or thyroid nodule.  Lungs: Clear to auscultation bilaterally with normal respiratory effort. CV: Nondisplaced PMI.  Heart regular S1/S2, no S3/S4, 1/6 SEM RUSB.  No peripheral edema.  No carotid bruit.  Normal pedal pulses.  Abdomen: Soft, nontender, no hepatosplenomegaly, no distention.  Skin: Intact without lesions or rashes.  Neurologic: Alert and oriented x 3.  Psych: Normal affect. Extremities: No clubbing or cyanosis.  HEENT: Normal.   ASSESSMENT & PLAN: 1. Chronic HFpEF: Echo (3/23) with EF 60-65% Grade II DD.  She has had several HF admissions in the last year.  NYHA class II, feels better recently. Not volume overloaded on exam and weight is stable.  - Continue torsemide 60 mg daily. BMET/BNP today.  - With diastolic CHF, think it would be reasonable to assess her for cardiac amyloidosis.  Will arrange for PYP scan, myeloma panel, urine immunofixation.  - Continue spironolactone 12.5 mg daily.  - Continue Jardiance 10 mg daily.  - We are trying to get approval for Cardiomems.  2. OSA: Needs CPAP titration.  3. Obesity: Body mass index is 33.13 kg/m. - She is on Ozempic. 4. CKD Stage III: Sees nephrology.   - BMET today.  5. Hypertension:  Continue hydralazine, Coreg, amlodipine. She gets lightheaded at times, will not increase today.  6. Hyperthyroidism: She is on methimazole and has been referred to endocrinology.  7. Chest pain: She has episodes of chest tightness with typical and atypical features.  - I will arrange for cardiac PET to assess for ischemia.   Followup in 3 months with APP is PET is nonischemic.   Loralie Champagne, MD 03/17/22

## 2022-03-18 LAB — IMMUNOFIXATION, URINE

## 2022-03-19 ENCOUNTER — Telehealth (HOSPITAL_COMMUNITY): Payer: Self-pay | Admitting: Emergency Medicine

## 2022-03-19 NOTE — Telephone Encounter (Signed)
Called and spoke w/ Fritz Pickerel to confirm that [name] is aware of the dosage change of her Torsemide to '40mg'$  daily.  He states she is aware.  She is not home today, she is helping her sister move. Next home visit is 03/23/22 @ 11:00

## 2022-03-22 NOTE — Progress Notes (Deleted)
Cardiology Office Note   Date:  03/22/2022   ID:  [name] [name], DOB 1960-09-10, MRN [id number]  PCP:  [name], MD  Cardiologist:   Minus Breeding, MD Referring:  [name], MD   No chief complaint on file.     History of Present Illness: [name] [name] is a 61 y.o. female who is referred for evaluation of SOB.    Since I last saw her she has been hospitalized a three times and has been seen in the HF clinic.   Of note she has had some chest pain and a PYP scan is pending.  ***   ***   she was in the hospital with Covid.  I reviewed these records for this visit. She had respiratory failure and she was thought to have an element of diastolic HF.  She has CKD IIIa.  Echocardiogram in September demonstrated an EF of 65 to 70%.  Since going home she has done okay.  She had acute shortness of breath that indicated her COVID.  She had cough.  She is no longer having this.  She gets around in her two-story home although I get the sense she is not overly active.  She denies any PND or orthopnea.  She has had some chronic lower extremity swelling.  She denies any chest pressure, neck or arm discomfort.  She had no weight gain.  She is actually down from her peak..  She is actually down from her peak.  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 18 g 0   amLODipine (NORVASC) 2.5 MG tablet Take 1 tablet (2.5 mg total) by mouth daily. 90 tablet 1   atorvastatin (LIPITOR) 40 MG tablet Take 1 tablet (40 mg total) by mouth every morning. 90 tablet 1   carvedilol (COREG) 25 MG tablet Take 1 tablet (25 mg total) by mouth 2 (two) times daily. 180 tablet 1   empagliflozin (JARDIANCE) 10 MG TABS tablet Take 1 tablet (10 mg total) by mouth daily. 90 tablet 1   famotidine (PEPCID) 40 MG tablet Take 1 tablet (40 mg total) by mouth daily. 30 tablet 3   ferrous sulfate 325 (65 FE) MG tablet Take 325 mg by mouth daily with breakfast.     hydrALAZINE (APRESOLINE) 50 MG tablet TAKE ONE TABLET BY MOUTH THREE TIMES DAILY 270 tablet 1   methimazole (TAPAZOLE) 5 MG tablet Take 1 tablet (5 mg total) by mouth 3 (three) times daily. 90 tablet 1   mometasone-formoterol (DULERA) 100-5 MCG/ACT AERO Inhale 2 puffs into the lungs as needed for wheezing.     nystatin ointment (MYCOSTATIN) Apply  1 application. topically 2 (two) times daily as needed (rash/skin irritation). 30 g 1   oxybutynin (DITROPAN) 5 MG tablet Take 1 tablet (5 mg total) by mouth 2 (two) times daily. 60 tablet 6   pregabalin (LYRICA) 75 MG capsule TAKE ONE CAPSULE BY MOUTH TWICE DAILY 180 capsule 1   Semaglutide,0.25 or 0.'5MG'$ /DOS, (OZEMPIC, 0.25 OR 0.5 MG/DOSE,) 2 MG/1.5ML SOPN Inject 0.5 mg into the skin once a week. 2 mL 6   spironolactone (ALDACTONE) 25 MG tablet Take 0.5 tablets (12.5 mg total) by mouth daily. 30 tablet 3   torsemide (DEMADEX) 20 MG tablet Take 3 tablets (60 mg total) by mouth daily. 90 tablet 6   No current facility-administered medications for this visit.    Allergies:   Lisinopril    ROS:  Please see the history of present illness.   Otherwise, review of systems are positive for ***.   All other systems are reviewed and negative.    PHYSICAL EXAM: VS:  LMP 04/01/2010  ,  BMI There is no height or weight on file to calculate BMI. GENERAL:  Well appearing NECK:  No jugular venous distention, waveform within normal limits, carotid upstroke brisk and symmetric, no bruits, no thyromegaly LUNGS:  Clear to auscultation bilaterally CHEST:  Unremarkable HEART:  PMI not displaced or sustained,S1 and S2 within normal limits, no S3, no S4, no clicks, no rubs, *** murmurs ABD:  Flat, positive bowel sounds normal in frequency in pitch, no bruits, no rebound, no guarding, no midline pulsatile mass, no hepatomegaly, no splenomegaly EXT:  2 plus pulses throughout, no edema, no cyanosis no clubbing    ***GENERAL:  Well appearing NECK:  No jugular venous distention, waveform within normal limits, carotid upstroke brisk and symmetric, no bruits, no thyromegaly LUNGS:  Clear to auscultation bilaterally CHEST:  Unremarkable HEART:  PMI not displaced or sustained,S1 and S2 within normal limits, no S3, no S4, no clicks, no rubs, soft systolic murmur heard best at the left upper sternal border, no diastolic murmurs ABD:  Flat, positive bowel sounds normal in frequency in pitch, no bruits, no rebound, no guarding, no midline pulsatile mass, no hepatomegaly, no splenomegaly EXT:  2 plus pulses throughout, mild leg edema, no cyanosis no clubbing  EKG:  EKG is *** ordered today. ***  Recent Labs: 09/24/2021: Magnesium 1.8 09/25/2021: Hemoglobin 8.0; Platelets 168 10/01/2021: ALT 5 02/24/2022: TSH <0.005 03/16/2022: B Natriuretic Peptide 35.6; BUN 55; Creatinine, Ser 2.01; Potassium 4.2; Sodium 139    Lipid Panel    Component Value Date/Time   CHOL 113 02/10/2022 1043   TRIG 81 02/10/2022 1043   HDL 52 02/10/2022 1043   CHOLHDL 2.5 01/29/2019 1118   CHOLHDL 3 06/18/2014 0951   VLDL 19.6 06/18/2014 0951   LDLCALC 45 02/10/2022 1043      Wt Readings from Last 3 Encounters:  03/16/22 193 lb (87.5 kg)  03/09/22 184 lb (83.5 kg)  02/23/22 192 lb (87.1 kg)      Other studies  Reviewed: Additional studies/ records that were reviewed today include:  *** Review of the above records demonstrates: See elsewhere   ASSESSMENT AND PLAN:   CHRONIC DIASTOLIC HF:   The HF clinic is trying to get approval for Cardiomems.  ***   oday she seems to be relatively euvolemic.  I will check a basic metabolic profile.  She will remain on the meds as listed.  She is also being evaluated with PYP scan.   ***   DM:  A1C was ***  I will check an A1c.  Otherwise no change in therapy  HTN:     BP is *** elevated.  Light headedness has precluded med titration.   Of note she has had angioedema with ACEi.  ***  I am going to increase her carvedilol to 12.5 mg twice daily.  This is  CKD IIIB:  ***   DYSPNEA:  ***    This is multifactorial.  Again I think she is euvolemic.  I will check a BNP level.  OBESITY:   *** e have talked extensively about weight loss.  SLEEP APNEA:   ***  I had her sleep apnea nurse talk to her again.  The patient has daytime somnolence.  She has an elevated Epworth sleepiness score.  She has fatigue.  She has snoring.  Because it has been greater than 2 years she will need another in lab sleep study.   Current medicines are reviewed at length with the patient today.  The patient does not have concerns regarding medicines.  The following changes have been made:  ***  Labs/ tests ordered today include:  ***  No orders of the defined types were placed in this encounter.     Disposition:   FU with ***   Signed, Minus Breeding, MD  03/22/2022 11:38 AM    [facility]

## 2022-03-23 ENCOUNTER — Other Ambulatory Visit: Payer: Self-pay | Admitting: Family Medicine

## 2022-03-23 ENCOUNTER — Other Ambulatory Visit (HOSPITAL_COMMUNITY): Payer: Self-pay | Admitting: Surgery

## 2022-03-23 ENCOUNTER — Other Ambulatory Visit (HOSPITAL_COMMUNITY): Payer: Self-pay | Admitting: *Deleted

## 2022-03-23 ENCOUNTER — Other Ambulatory Visit (HOSPITAL_COMMUNITY): Payer: Self-pay | Admitting: Emergency Medicine

## 2022-03-23 ENCOUNTER — Ambulatory Visit: Payer: Medicare Other | Admitting: Cardiology

## 2022-03-23 LAB — MULTIPLE MYELOMA PANEL, SERUM
Albumin SerPl Elph-Mcnc: 3.8 g/dL (ref 2.9–4.4)
Albumin/Glob SerPl: 1.2 (ref 0.7–1.7)
Alpha 1: 0.2 g/dL (ref 0.0–0.4)
Alpha2 Glob SerPl Elph-Mcnc: 0.8 g/dL (ref 0.4–1.0)
B-Globulin SerPl Elph-Mcnc: 1 g/dL (ref 0.7–1.3)
Gamma Glob SerPl Elph-Mcnc: 1.3 g/dL (ref 0.4–1.8)
Globulin, Total: 3.2 g/dL (ref 2.2–3.9)
IgA: 188 mg/dL (ref 87–352)
IgG (Immunoglobin G), Serum: 1321 mg/dL (ref 586–1602)
IgM (Immunoglobulin M), Srm: 37 mg/dL (ref 26–217)
Total Protein ELP: 7 g/dL (ref 6.0–8.5)

## 2022-03-23 MED ORDER — TORSEMIDE 20 MG PO TABS: 40.0000 mg | ORAL_TABLET | Freq: Every day | ORAL | 6 refills | Status: DC

## 2022-03-23 NOTE — Progress Notes (Signed)
CHL Medlist updated per latest recommendation from Dr. Aundra Dubin.  HF Community Paramedic assured that patient taking the correct dosage Torsemide 40 mg daily.

## 2022-03-23 NOTE — Progress Notes (Signed)
Home visit with Ms. Nathaneil Canary today with med reconciliation only.  She reports to be feeling poorly.  She has had stomach cramps, diarrhea, and just overall achy.  Both pt and myself wore surgical masks.  I assisted her with a COVID test and it showed negative.  Ms. Hollinger's boyfriend reports he had the same type sickness over the weekend and states, "I must have given her what I had."  She is afebrile.  She denies chest pain or SOB.  No noted edema.   Pt reports that [facility] delivered her meds in a box in daily packaging.  This was not requested.  I contacted the pharm and inquired about same.  I advised them I would be filling a weeks worth of meds from the box and returning the unused portion to the [facility] location for them to deconstruct back into pill bottles.   No vitals taken today as pt states she just feels too bad. This visit took an extended amount of time due to having to cut open plastic pill wrappers to complete her pill box.   Meds returned to [facility] and they advised they will deliver the meds in bottles by the end of the week.  Ms. Corso was out of Oxybutin which will be refilled and delivered tomorrow to complete her med regimen. Home visit complete.    [name], [place] 03/23/2022

## 2022-03-29 ENCOUNTER — Ambulatory Visit (HOSPITAL_COMMUNITY)
Admission: RE | Admit: 2022-03-29 | Discharge: 2022-03-29 | Disposition: A | Discharge status: Home / Self Care | Payer: Medicare Other | Source: Ambulatory Visit | Attending: Cardiology | Admitting: Cardiology

## 2022-03-29 ENCOUNTER — Telehealth (HOSPITAL_COMMUNITY): Payer: Self-pay | Admitting: Emergency Medicine

## 2022-03-29 DIAGNOSIS — I5022 Chronic systolic (congestive) heart failure: Secondary | ICD-10-CM

## 2022-03-29 DIAGNOSIS — Z79899 Other long term (current) drug therapy: Secondary | ICD-10-CM | POA: Insufficient documentation

## 2022-03-29 LAB — BASIC METABOLIC PANEL
Anion gap: 4 — ABNORMAL LOW (ref 5–15)
BUN: 40 mg/dL — ABNORMAL HIGH (ref 8–23)
CO2: 26 mmol/L (ref 22–32)
Calcium: 9.4 mg/dL (ref 8.9–10.3)
Chloride: 111 mmol/L (ref 98–111)
Creatinine, Ser: 1.91 mg/dL — ABNORMAL HIGH (ref 0.44–1.00)
GFR, Estimated: 29 mL/min — ABNORMAL LOW (ref >60)
Glucose, Bld: 161 mg/dL — ABNORMAL HIGH (ref 70–99)
Potassium: 4.1 mmol/L (ref 3.5–5.1)
Sodium: 141 mmol/L (ref 135–145)

## 2022-03-29 NOTE — Telephone Encounter (Signed)
Called and spoke with [name] [name] about [name] home visit time tomorrow.  I changed time from 11:00 to 12:00    [name], [name] 03/29/2022

## 2022-03-30 ENCOUNTER — Other Ambulatory Visit (HOSPITAL_COMMUNITY): Payer: Self-pay | Admitting: Emergency Medicine

## 2022-03-30 ENCOUNTER — Telehealth: Payer: Self-pay | Admitting: Emergency Medicine

## 2022-03-30 NOTE — Telephone Encounter (Signed)
New referral need to be placed that accepts her insurance.

## 2022-03-30 NOTE — Progress Notes (Signed)
Paramedicine Encounter    Patient ID: [name] [name], female    DOB: [birth year], 61 y.o.   MRN: [id number]   BP (!) 140/70 (BP Location: Left Arm, Patient Position: Sitting, Cuff Size: Normal)   Pulse 89   LMP 04/01/2010   SpO2 96%  Weight yesterday-not taken Last visit weight-not taken-scale broken  ATF Mr. Koranda A&O x 4, skin W&D w/ good color.  Pt. Reports to be feeling well.  No complaints of chest pain or SOB.  Lung sounds clear and equal bilat.  No edema noted.  HF Clinic reported pt with some swelling to her eyes and asked me to check to see if she was able to take some Benadryl as the swelling could some type of reaction from one of her medicines.  At our visit today I find her eyes to look swollen as she always does.  Some notes from her recent visit with Dr. Margarita Rana wanted to refer her to Dr. Chalmers Cater an endocrinologist.  Pt. Has been unable to reach Dr. Almetta Lovely office.  I called for her during our visit today and was advised that Dr. Chalmers Cater doesn't accept Medicaid as a primary insurance.  I contacted [facility] and relayed this information to be provided to Dr. Margarita Rana for further guidance.  Medications reconciled without incident.  Refills ordered from [facility] for Torsemide, Spironolactone, Methimazole, Famotidine and Oxybutin.  These are to be delivered to Ms. Chervenak this afternoon.  Will try and get her a scale for her next home visit as hers is broken.  Home visit complete.  Patient Care Team: [name], MD as PCP - General (Family Medicine) Minus Breeding, MD as PCP - Cardiology (Cardiology) Craft, Lorel Monaco, RN as Case Manager [facility], Tami Lin, [place] as Physician Assistant (Cardiology)  Patient Active Problem List   Diagnosis Date Noted   Acute on chronic heart failure with preserved ejection fraction (HFpEF) ([place]) 09/22/2021   Heart failure ([place]) 09/22/2021   CKD (chronic kidney disease) stage 4, GFR 15-29 ml/min ([place]) 07/03/2021   Acute  respiratory failure due to COVID-19 ([place]) 05/06/2021   COVID-19 05/05/2021   CHF exacerbation ([place]) 04/02/2021   Chest pain 04/02/2021   Acute-on-chronic kidney injury ([place]) 04/02/2021   COPD exacerbation ([place]) 12/05/2020   CKD (chronic kidney disease), stage III ([place]) 12/04/2020   Anemia of chronic disease 12/04/2020   Obesity, Class III, BMI 40-49.9 (morbid obesity) ([place]) 07/27/2020   Acute respiratory disease due to COVID-19 virus 07/25/2020   Acute hypoxemic respiratory failure due to COVID-19 ([place]) 07/25/2020   Morbid obesity ([place]) 11/23/2019   Acute respiratory failure with hypoxia ([place]) 11/06/2019   Chronic kidney disease, stage 3b ([place]) 11/06/2019   COPD without exacerbation ([place])    Acute kidney failure ([place]) 08/18/2019   Left ventricular hypertrophy 07/21/2019   Educated about COVID-19 virus infection 00/86/7619   Acute diastolic HF (heart failure) (HCC)    Hypoxia    OSA (obstructive sleep apnea) 05/09/2019   Hyperglycemia 04/09/2019   Snoring 03/13/2019   Chronic anemia 08/17/2018   Chronic cystitis 08/03/2018   Diabetic neuropathy ([place]) 06/16/2017   Non compliance w medication regimen 04/12/2017   Family history of colon cancer in mother 11/17/2016   Dyspareunia in female 11/11/2016   Screen for colon cancer 11/11/2016   History of ovarian cyst 11/11/2016   Chronic hepatitis C without hepatic coma ([place]) 11/09/2016   Hyperlipidemia 10/07/2016   Type 2 diabetes mellitus with hyperlipidemia ([place]) 01/29/2014   Status  post intraocular lens implant 11/08/2013   PCO (posterior capsular opacification) 09/28/2013   Pseudophakia 09/12/2013   GERD (gastroesophageal reflux disease) 09/06/2013   Hypertension 09/06/2013   ANEMIA, IRON DEFICIENCY 10/03/2009   LIPOMA 06/18/2009   TINEA PEDIS 05/07/2009   SUBSTANCE ABUSE, MULTIPLE 02/22/2007   HCVD (hypertensive cardiovascular disease) 02/22/2007    Current Outpatient Medications:    amLODipine (NORVASC) 2.5 MG tablet, Take  1 tablet (2.5 mg total) by mouth daily., Disp: 90 tablet, Rfl: 1   atorvastatin (LIPITOR) 40 MG tablet, Take 1 tablet (40 mg total) by mouth every morning., Disp: 90 tablet, Rfl: 1   carvedilol (COREG) 25 MG tablet, Take 1 tablet (25 mg total) by mouth 2 (two) times daily., Disp: 180 tablet, Rfl: 1   empagliflozin (JARDIANCE) 10 MG TABS tablet, Take 1 tablet (10 mg total) by mouth daily., Disp: 90 tablet, Rfl: 1   famotidine (PEPCID) 40 MG tablet, TAKE ONE TABLET BY MOUTH daily, Disp: 30 tablet, Rfl: 3   ferrous sulfate 325 (65 FE) MG tablet, Take 325 mg by mouth daily with breakfast., Disp: , Rfl:    hydrALAZINE (APRESOLINE) 50 MG tablet, TAKE ONE TABLET BY MOUTH THREE TIMES DAILY, Disp: 270 tablet, Rfl: 1   methimazole (TAPAZOLE) 5 MG tablet, Take 1 tablet (5 mg total) by mouth 3 (three) times daily., Disp: 90 tablet, Rfl: 1   mometasone-formoterol (DULERA) 100-5 MCG/ACT AERO, Inhale 2 puffs into the lungs as needed for wheezing., Disp: , Rfl:    pregabalin (LYRICA) 75 MG capsule, TAKE ONE CAPSULE BY MOUTH TWICE DAILY, Disp: 180 capsule, Rfl: 1   Semaglutide,0.25 or 0.'5MG'$ /DOS, (OZEMPIC, 0.25 OR 0.5 MG/DOSE,) 2 MG/1.5ML SOPN, Inject 0.5 mg into the skin once a week., Disp: 2 mL, Rfl: 6   albuterol (VENTOLIN HFA) 108 (90 Base) MCG/ACT inhaler, Inhale 1 puff into the lungs every 4 (four) hours as needed for wheezing or shortness of breath. (Patient not taking: Reported on 03/23/2022), Disp: 18 g, Rfl: 0   nystatin ointment (MYCOSTATIN), Apply 1 application. topically 2 (two) times daily as needed (rash/skin irritation)., Disp: 30 g, Rfl: 1   oxybutynin (DITROPAN) 5 MG tablet, Take 1 tablet (5 mg total) by mouth 2 (two) times daily., Disp: 60 tablet, Rfl: 6   spironolactone (ALDACTONE) 25 MG tablet, Take 0.5 tablets (12.5 mg total) by mouth daily., Disp: 30 tablet, Rfl: 3   torsemide (DEMADEX) 20 MG tablet, Take 2 tablets (40 mg total) by mouth daily., Disp: 90 tablet, Rfl: 6 Allergies  Allergen  Reactions   Lisinopril Swelling      Social History   Socioeconomic History   Marital status: Legally Separated    Spouse name: Not on file   Number of children: 1   Years of education: Not on file   Highest education level: 9th grade  Occupational History   Occupation: disability  Tobacco Use   Smoking status: Never   Smokeless tobacco: Never  Vaping Use   Vaping Use: Never used  Substance and Sexual Activity   Alcohol use: No   Drug use: Not Currently    Types: Cocaine, Marijuana    Comment: remote h/o cocaine 2015 and marijuana 2018   Sexual activity: Not Currently  Other Topics Concern   Not on file  Social History Narrative   Lives with friend, Rosalee Kaufman and nephew.  One daughter.    Social Determinants of Health   Financial Resource Strain: Low Risk  (12/29/2021)   Overall Financial Resource Strain (CARDIA)  Difficulty of Paying Living Expenses: Not very hard  Food Insecurity: No Food Insecurity (11/26/2021)   Hunger Vital Sign    Worried About Running Out of Food in the Last Year: Never true    Ran Out of Food in the Last Year: Never true  Transportation Needs: No Transportation Needs (11/26/2021)   PRAPARE - Hydrologist (Medical): No    Lack of Transportation (Non-Medical): No  Recent Concern: Transportation Needs - Unmet Transportation Needs (08/31/2021)   PRAPARE - Transportation    Lack of Transportation (Medical): No    Lack of Transportation (Non-Medical): Yes  Physical Activity: Insufficiently Active (02/04/2022)   Exercise Vital Sign    Days of Exercise per Week: 1 day    Minutes of Exercise per Session: 30 min  Stress: No Stress Concern Present (09/21/2021)   [place]    Feeling of Stress : Not at all  Social Connections: Moderately Integrated (12/29/2021)   Social Connection and Isolation Panel [NHANES]    Frequency of Communication with Friends and  Family: More than three times a week    Frequency of Social Gatherings with Friends and Family: More than three times a week    Attends Religious Services: More than 4 times per year    Active Member of [employer] or Organizations: No    Attends Archivist Meetings: Never    Marital Status: Living with partner  Intimate Partner Violence: Not At Risk (02/04/2022)   Humiliation, Afraid, Rape, and Kick questionnaire    Fear of Current or Ex-Partner: No    Emotionally Abused: No    Physically Abused: No    Sexually Abused: No    Physical Exam      Future Appointments  Date Time Provider [place]  04/13/2022 12:30 PM [facility] CCC-MM CARE MANAGER 2 THN-CCC None  05/31/2022 11:10 AM [name], MD CHW-CHWW None  06/16/2022 10:30 AM [facility] PA/NP [facility] None       [name], [place] Paramedic  03/30/22

## 2022-03-30 NOTE — Telephone Encounter (Signed)
Copied from [place] [id number]. Topic: Referral - Question >> Mar 30, 2022 12:16 PM Erskine Squibb wrote: Reason for CRM: [name] the [employer] Paramedic who makes weekly visits to see the patient states her provider wanted her to see an Endocrinologist due to swelling in her eyes. She states Dr Porfirio Oar office does not take [employer] as a primary insurance only as a secondary. [name] has asked if the provider can located an Endocrinologist who accepts Medicaid as a primary insurance and contact the patient. Please assist patient further

## 2022-03-31 NOTE — Telephone Encounter (Signed)
Attempted to contact patient and no VM set up at this time.

## 2022-04-03 ENCOUNTER — Other Ambulatory Visit: Payer: Self-pay | Admitting: Family Medicine

## 2022-04-03 DIAGNOSIS — N1832 Chronic kidney disease, stage 3b: Secondary | ICD-10-CM

## 2022-04-05 NOTE — Telephone Encounter (Signed)
Requested Prescriptions  Pending Prescriptions Disp Refills  . OZEMPIC, 0.25 OR 0.5 MG/DOSE, 2 MG/3ML SOPN [Pharmacy Med Name: Ozempic 0.25 mg or 0.5 mg (2 mg/3 mL) subcutaneous pen injector] 3 mL 1    Sig: INJECT 0.5 MG into [place] A WEEK     Endocrinology:  Diabetes - GLP-1 Receptor Agonists - semaglutide Failed - 04/03/2022  8:00 AM      Failed - HBA1C in normal range and within 180 days    HbA1c, POC (controlled diabetic range)  Date Value Ref Range Status  02/10/2022 7.4 (A) 0.0 - 7.0 % Final         Failed - Cr in normal range and within 360 days    Creat  Date Value Ref Range Status  12/12/2012 0.88 0.50 - 1.10 mg/dL Final   Creatinine, Ser  Date Value Ref Range Status  03/29/2022 1.91 (H) 0.44 - 1.00 mg/dL Final   Creatinine,U  Date Value Ref Range Status  06/13/2007 45.3 mg/dL Final    Comment:    See lab report for associated comment(s)   Creatinine, Urine  Date Value Ref Range Status  09/15/2021 47 mg/dL Final    Comment:    Performed at [facility], [address] [place], [place] [address]         Passed - Valid encounter within last 6 months    Recent Outpatient Visits          1 month ago 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 ([place])   [place], [place], MD   5 months ago Type 2 diabetes mellitus with other neurologic complication, with long-term current use of insulin ([place])   [place], [place], MD   6 months ago Hospital discharge follow-up   [place], Broadus John, FNP   6 months ago Acute on chronic congestive heart failure, unspecified heart failure type [facility])   [facility], Kriste Basque, NP   7 months ago Type 2 diabetes mellitus with other neurologic complication, with long-term current use of insulin ([place])   [place], MD      Future Appointments            In 1 month [name], MD [place]

## 2022-04-06 ENCOUNTER — Other Ambulatory Visit (HOSPITAL_COMMUNITY): Payer: Self-pay | Admitting: Emergency Medicine

## 2022-04-06 NOTE — Progress Notes (Signed)
Paramedicine Encounter    Patient ID: [name] [name], female    DOB: 04-Aug-1960, 61 y.o.   MRN: [id number]   BP 130/80 (BP Location: Right Arm, Patient Position: Sitting, Cuff Size: Normal)   Pulse 77   LMP 04/01/2010   SpO2 96%  Weight yesterday-not taken Last visit weight-not taken  Home visit with [name] [name] today.  She reports to be feeling well and was in a great mood.  No chest pain or SOB.  Lung sounds clear and equal bilat.  No edema noted to her extremities.  Unable to weigh her as her scale is still broken.  Meds reconciled without incident and no refill needs at this time.   Dr. Margarita Rana had been attempting to reach [name] [name] regarding getting her scheduled with Endocrinologist.  I assisted her with making appointment - first available appointment 08/19/22 w/ Dr. March Rummage at [facility] @ 2:40 located on the [place] on [address] in [place].  The office put her on a cancellation list incase an earlier appointment becomes available. She missed a [facility] appointment for [employer].  I instructed her on how to call and reschedule and she advised she would do same.  Home visit complete.    [name] [name], [place] 04/06/2022  Patient Care Team: [name], MD as PCP - General (Family Medicine) Minus Breeding, MD as PCP - Cardiology (Cardiology) Craft, Lorel Monaco, RN as Case Manager [facility], Tami Lin, [place] as Physician Assistant (Cardiology)  Patient Active Problem List   Diagnosis Date Noted   Acute on chronic heart failure with preserved ejection fraction (HFpEF) ([place]) 09/22/2021   Heart failure ([place]) 09/22/2021   CKD (chronic kidney disease) stage 4, GFR 15-29 ml/min ([place]) 07/03/2021   Acute respiratory failure due to COVID-19 ([place]) 05/06/2021   COVID-19 05/05/2021   CHF exacerbation ([place]) 04/02/2021   Chest pain 04/02/2021   Acute-on-chronic kidney injury ([place]) 04/02/2021   COPD exacerbation ([place])  12/05/2020   CKD (chronic kidney disease), stage III ([place]) 12/04/2020   Anemia of chronic disease 12/04/2020   Obesity, Class III, BMI 40-49.9 (morbid obesity) ([place]) 07/27/2020   Acute respiratory disease due to COVID-19 virus 07/25/2020   Acute hypoxemic respiratory failure due to COVID-19 ([place]) 07/25/2020   Morbid obesity ([place]) 11/23/2019   Acute respiratory failure with hypoxia ([place]) 11/06/2019   Chronic kidney disease, stage 3b ([place]) 11/06/2019   COPD without exacerbation ([place])    Acute kidney failure ([place]) 08/18/2019   Left ventricular hypertrophy 07/21/2019   Educated about COVID-19 virus infection 05/15/2535   Acute diastolic HF (heart failure) (HCC)    Hypoxia    OSA (obstructive sleep apnea) 05/09/2019   Hyperglycemia 04/09/2019   Snoring 03/13/2019   Chronic anemia 08/17/2018   Chronic cystitis 08/03/2018   Diabetic neuropathy ([place]) 06/16/2017   Non compliance w medication regimen 04/12/2017   Family history of colon cancer in mother 11/17/2016   Dyspareunia in female 11/11/2016   Screen for colon cancer 11/11/2016   History of ovarian cyst 11/11/2016   Chronic hepatitis C without hepatic coma ([place]) 11/09/2016   Hyperlipidemia 10/07/2016   Type 2 diabetes mellitus with hyperlipidemia ([place]) 01/29/2014   Status post intraocular lens implant 11/08/2013   PCO (posterior capsular opacification) 09/28/2013   Pseudophakia 09/12/2013   GERD (gastroesophageal reflux disease) 09/06/2013   Hypertension 09/06/2013   ANEMIA, IRON DEFICIENCY 10/03/2009   LIPOMA 06/18/2009   TINEA PEDIS 05/07/2009   SUBSTANCE ABUSE, MULTIPLE 02/22/2007  HCVD (hypertensive cardiovascular disease) 02/22/2007    Current Outpatient Medications:    amLODipine (NORVASC) 2.5 MG tablet, Take 1 tablet (2.5 mg total) by mouth daily., Disp: 90 tablet, Rfl: 1   atorvastatin (LIPITOR) 40 MG tablet, Take 1 tablet (40 mg total) by mouth every morning., Disp: 90 tablet, Rfl: 1   carvedilol (COREG) 25 MG  tablet, Take 1 tablet (25 mg total) by mouth 2 (two) times daily., Disp: 180 tablet, Rfl: 1   empagliflozin (JARDIANCE) 10 MG TABS tablet, Take 1 tablet (10 mg total) by mouth daily., Disp: 90 tablet, Rfl: 1   famotidine (PEPCID) 40 MG tablet, TAKE ONE TABLET BY MOUTH daily, Disp: 30 tablet, Rfl: 3   ferrous sulfate 325 (65 FE) MG tablet, Take 325 mg by mouth daily with breakfast., Disp: , Rfl:    hydrALAZINE (APRESOLINE) 50 MG tablet, TAKE ONE TABLET BY MOUTH THREE TIMES DAILY, Disp: 270 tablet, Rfl: 1   methimazole (TAPAZOLE) 5 MG tablet, Take 1 tablet (5 mg total) by mouth 3 (three) times daily., Disp: 90 tablet, Rfl: 1   mometasone-formoterol (DULERA) 100-5 MCG/ACT AERO, Inhale 2 puffs into the lungs as needed for wheezing., Disp: , Rfl:    nystatin ointment (MYCOSTATIN), Apply 1 application. topically 2 (two) times daily as needed (rash/skin irritation)., Disp: 30 g, Rfl: 1   oxybutynin (DITROPAN) 5 MG tablet, Take 1 tablet (5 mg total) by mouth 2 (two) times daily., Disp: 60 tablet, Rfl: 6   OZEMPIC, 0.25 OR 0.5 MG/DOSE, 2 MG/3ML SOPN, INJECT 0.5 MG into THE SKIN ONCE A WEEK, Disp: 3 mL, Rfl: 1   pregabalin (LYRICA) 75 MG capsule, TAKE ONE CAPSULE BY MOUTH TWICE DAILY, Disp: 180 capsule, Rfl: 1   spironolactone (ALDACTONE) 25 MG tablet, Take 0.5 tablets (12.5 mg total) by mouth daily., Disp: 30 tablet, Rfl: 3   torsemide (DEMADEX) 20 MG tablet, Take 2 tablets (40 mg total) by mouth daily., Disp: 90 tablet, Rfl: 6   albuterol (VENTOLIN HFA) 108 (90 Base) MCG/ACT inhaler, Inhale 1 puff into the lungs every 4 (four) hours as needed for wheezing or shortness of breath. (Patient not taking: Reported on 03/23/2022), Disp: 18 g, Rfl: 0 Allergies  Allergen Reactions   Lisinopril Swelling      Social History   Socioeconomic History   Marital status: Legally Separated    Spouse name: Not on file   Number of children: 1   Years of education: Not on file   Highest education level: 9th grade   Occupational History   Occupation: disability  Tobacco Use   Smoking status: Never   Smokeless tobacco: Never  Vaping Use   Vaping Use: Never used  Substance and Sexual Activity   Alcohol use: No   Drug use: Not Currently    Types: Cocaine, Marijuana    Comment: remote h/o cocaine 2015 and marijuana 2018   Sexual activity: Not Currently  Other Topics Concern   Not on file  Social History Narrative   Lives with friend, Rosalee Kaufman and nephew.  One daughter.    Social Determinants of Health   Financial Resource Strain: Low Risk  (12/29/2021)   Overall Financial Resource Strain (CARDIA)    Difficulty of Paying Living Expenses: Not very hard  Food Insecurity: No Food Insecurity (11/26/2021)   Hunger Vital Sign    Worried About Running Out of Food in the Last Year: Never true    Ran Out of Food in the Last Year: Never true  Transportation Needs:  No Transportation Needs (11/26/2021)   PRAPARE - Hydrologist (Medical): No    Lack of Transportation (Non-Medical): No  Recent Concern: Transportation Needs - Unmet Transportation Needs (08/31/2021)   PRAPARE - Transportation    Lack of Transportation (Medical): No    Lack of Transportation (Non-Medical): Yes  Physical Activity: Insufficiently Active (02/04/2022)   Exercise Vital Sign    Days of Exercise per Week: 1 day    Minutes of Exercise per Session: 30 min  Stress: No Stress Concern Present (09/21/2021)   [place]    Feeling of Stress : Not at all  Social Connections: Moderately Integrated (12/29/2021)   Social Connection and Isolation Panel [NHANES]    Frequency of Communication with Friends and Family: More than three times a week    Frequency of Social Gatherings with Friends and Family: More than three times a week    Attends Religious Services: More than 4 times per year    Active Member of [employer] or Organizations: No    Attends Theatre manager Meetings: Never    Marital Status: Living with partner  Intimate Partner Violence: Not At Risk (02/04/2022)   Humiliation, Afraid, Rape, and Kick questionnaire    Fear of Current or Ex-Partner: No    Emotionally Abused: No    Physically Abused: No    Sexually Abused: No    Physical Exam      Future Appointments  Date Time Provider [place]  04/13/2022 12:30 PM [facility] CCC-MM CARE MANAGER 2 THN-CCC None  05/31/2022 11:10 AM [name], MD CHW-CHWW None  06/16/2022 10:30 AM [facility] PA/NP MC-[facility] None       [name] [name], [place] Paramedic  04/06/22

## 2022-04-07 ENCOUNTER — Other Ambulatory Visit: Payer: Self-pay | Admitting: Family Medicine

## 2022-04-07 DIAGNOSIS — E0590 Thyrotoxicosis, unspecified without thyrotoxic crisis or storm: Secondary | ICD-10-CM

## 2022-04-12 ENCOUNTER — Other Ambulatory Visit (HOSPITAL_COMMUNITY): Payer: Self-pay | Admitting: Emergency Medicine

## 2022-04-12 NOTE — Progress Notes (Signed)
.Paramedicine Encounter    Patient ID: [name] [name], female    DOB: [birth year], 61 y.o.   MRN: [id number]   BP 118/70 (BP Location: Left Arm, Patient Position: Sitting, Cuff Size: Normal)   Pulse 76   Wt 191 lb 3.2 oz (86.7 kg)   LMP 04/01/2010   SpO2 96%   BMI 32.82 kg/m  Weight yesterday-not taken Last visit weight-not taken  ATF Ms. Sweezy A&O x 4, skin W&D w/ good color.  Pt looks great!  Pt. Has gotten off track with her medicines and not been as compliant as she has been.  Her boyfriend usually checks behind her on her med compliance but his father has just passed away and so he's been a little pre-occupied.  Pill box reconciled and I reviewed each med and how much she was to take.  Next visit she is to have her pill box reconciled and I will review for accuracy.  Planning on steering her towards being self sufficient and graduating from the paramedicine program. Today she denies chest pain or SOB. Lung sounds clear and equal bilat.  No edema noted to her extremities and not JVD observed.  Pill box reconciled.  No refills needed at this time.  Home visit complete.    [name], [place] 04/12/2022     Patient Care Team: [name], MD as PCP - General (Family Medicine) Minus Breeding, MD as PCP - Cardiology (Cardiology) Craft, Lorel Monaco, RN as Case Manager [place], Tami Lin, [place] as Physician Assistant (Cardiology)  Patient Active Problem List   Diagnosis Date Noted   Acute on chronic heart failure with preserved ejection fraction (HFpEF) ([place]) 09/22/2021   Heart failure ([place]) 09/22/2021   CKD (chronic kidney disease) stage 4, GFR 15-29 ml/min ([place]) 07/03/2021   Acute respiratory failure due to COVID-19 ([place]) 05/06/2021   COVID-19 05/05/2021   CHF exacerbation ([place]) 04/02/2021   Chest pain 04/02/2021   Acute-on-chronic kidney injury ([place]) 04/02/2021   COPD exacerbation ([place]) 12/05/2020   CKD (chronic kidney disease), stage III ([place])  12/04/2020   Anemia of chronic disease 12/04/2020   Obesity, Class III, BMI 40-49.9 (morbid obesity) ([place]) 07/27/2020   Acute respiratory disease due to COVID-19 virus 07/25/2020   Acute hypoxemic respiratory failure due to COVID-19 ([place]) 07/25/2020   Morbid obesity ([place]) 11/23/2019   Acute respiratory failure with hypoxia ([place]) 11/06/2019   Chronic kidney disease, stage 3b ([place]) 11/06/2019   COPD without exacerbation ([place])    Acute kidney failure ([place]) 08/18/2019   Left ventricular hypertrophy 07/21/2019   Educated about COVID-19 virus infection 34/19/6222   Acute diastolic HF (heart failure) (HCC)    Hypoxia    OSA (obstructive sleep apnea) 05/09/2019   Hyperglycemia 04/09/2019   Snoring 03/13/2019   Chronic anemia 08/17/2018   Chronic cystitis 08/03/2018   Diabetic neuropathy ([place]) 06/16/2017   Non compliance w medication regimen 04/12/2017   Family history of colon cancer in mother 11/17/2016   Dyspareunia in female 11/11/2016   Screen for colon cancer 11/11/2016   History of ovarian cyst 11/11/2016   Chronic hepatitis C without hepatic coma ([place]) 11/09/2016   Hyperlipidemia 10/07/2016   Type 2 diabetes mellitus with hyperlipidemia ([place]) 01/29/2014   Status post intraocular lens implant 11/08/2013   PCO (posterior capsular opacification) 09/28/2013   Pseudophakia 09/12/2013   GERD (gastroesophageal reflux disease) 09/06/2013   Hypertension 09/06/2013   ANEMIA, IRON DEFICIENCY 10/03/2009   LIPOMA 06/18/2009   TINEA PEDIS 05/07/2009   SUBSTANCE  ABUSE, MULTIPLE 02/22/2007   HCVD (hypertensive cardiovascular disease) 02/22/2007    Current Outpatient Medications:    amLODipine (NORVASC) 2.5 MG tablet, Take 1 tablet (2.5 mg total) by mouth daily., Disp: 90 tablet, Rfl: 1   atorvastatin (LIPITOR) 40 MG tablet, Take 1 tablet (40 mg total) by mouth every morning., Disp: 90 tablet, Rfl: 1   carvedilol (COREG) 25 MG tablet, Take 1 tablet (25 mg total) by mouth 2 (two) times  daily., Disp: 180 tablet, Rfl: 1   empagliflozin (JARDIANCE) 10 MG TABS tablet, Take 1 tablet (10 mg total) by mouth daily., Disp: 90 tablet, Rfl: 1   famotidine (PEPCID) 40 MG tablet, TAKE ONE TABLET BY MOUTH daily, Disp: 30 tablet, Rfl: 3   ferrous sulfate 325 (65 FE) MG tablet, Take 325 mg by mouth daily with breakfast., Disp: , Rfl:    hydrALAZINE (APRESOLINE) 50 MG tablet, TAKE ONE TABLET BY MOUTH THREE TIMES DAILY, Disp: 270 tablet, Rfl: 1   methimazole (TAPAZOLE) 5 MG tablet, Take 1 tablet (5 mg total) by mouth 3 (three) times daily., Disp: 90 tablet, Rfl: 1   mometasone-formoterol (DULERA) 100-5 MCG/ACT AERO, Inhale 2 puffs into the lungs as needed for wheezing., Disp: , Rfl:    nystatin ointment (MYCOSTATIN), Apply 1 application. topically 2 (two) times daily as needed (rash/skin irritation)., Disp: 30 g, Rfl: 1   oxybutynin (DITROPAN) 5 MG tablet, Take 1 tablet (5 mg total) by mouth 2 (two) times daily., Disp: 60 tablet, Rfl: 6   OZEMPIC, 0.25 OR 0.5 MG/DOSE, 2 MG/3ML SOPN, INJECT 0.5 MG into THE SKIN ONCE A WEEK, Disp: 3 mL, Rfl: 1   pregabalin (LYRICA) 75 MG capsule, TAKE ONE CAPSULE BY MOUTH TWICE DAILY, Disp: 180 capsule, Rfl: 1   spironolactone (ALDACTONE) 25 MG tablet, Take 0.5 tablets (12.5 mg total) by mouth daily., Disp: 30 tablet, Rfl: 3   torsemide (DEMADEX) 20 MG tablet, Take 2 tablets (40 mg total) by mouth daily., Disp: 90 tablet, Rfl: 6   albuterol (VENTOLIN HFA) 108 (90 Base) MCG/ACT inhaler, Inhale 1 puff into the lungs every 4 (four) hours as needed for wheezing or shortness of breath. (Patient not taking: Reported on 03/23/2022), Disp: 18 g, Rfl: 0 Allergies  Allergen Reactions   Lisinopril Swelling      Social History   Socioeconomic History   Marital status: Legally Separated    Spouse name: Not on file   Number of children: 1   Years of education: Not on file   Highest education level: 9th grade  Occupational History   Occupation: disability  Tobacco Use    Smoking status: Never   Smokeless tobacco: Never  Vaping Use   Vaping Use: Never used  Substance and Sexual Activity   Alcohol use: No   Drug use: Not Currently    Types: Cocaine, Marijuana    Comment: remote h/o cocaine 2015 and marijuana 2018   Sexual activity: Not Currently  Other Topics Concern   Not on file  Social History Narrative   Lives with friend, Rosalee Kaufman and nephew.  One daughter.    Social Determinants of Health   Financial Resource Strain: Low Risk  (12/29/2021)   Overall Financial Resource Strain (CARDIA)    Difficulty of Paying Living Expenses: Not very hard  Food Insecurity: No Food Insecurity (11/26/2021)   Hunger Vital Sign    Worried About Running Out of Food in the Last Year: Never true    Ran Out of Food in the Last Year:  Never true  Transportation Needs: No Transportation Needs (11/26/2021)   PRAPARE - Hydrologist (Medical): No    Lack of Transportation (Non-Medical): No  Recent Concern: Transportation Needs - Unmet Transportation Needs (08/31/2021)   PRAPARE - Transportation    Lack of Transportation (Medical): No    Lack of Transportation (Non-Medical): Yes  Physical Activity: Insufficiently Active (02/04/2022)   Exercise Vital Sign    Days of Exercise per Week: 1 day    Minutes of Exercise per Session: 30 min  Stress: No Stress Concern Present (09/21/2021)   [place]    Feeling of Stress : Not at all  Social Connections: Moderately Integrated (12/29/2021)   Social Connection and Isolation Panel [NHANES]    Frequency of Communication with Friends and Family: More than three times a week    Frequency of Social Gatherings with Friends and Family: More than three times a week    Attends Religious Services: More than 4 times per year    Active Member of [employer] or Organizations: No    Attends Archivist Meetings: Never    Marital Status: Living with  partner  Intimate Partner Violence: Not At Risk (02/04/2022)   Humiliation, Afraid, Rape, and Kick questionnaire    Fear of Current or Ex-Partner: No    Emotionally Abused: No    Physically Abused: No    Sexually Abused: No    Physical Exam      Future Appointments  Date Time Provider [place]  04/13/2022 12:30 PM [facility] CCC-MM CARE MANAGER 2 [facility] None  05/31/2022 11:10 AM [name], MD [facility] None  06/16/2022 10:30 AM [facility] PA/NP [facility] None       [name], [place] [facility] Paramedic  04/12/22

## 2022-04-13 ENCOUNTER — Other Ambulatory Visit: Payer: Self-pay | Admitting: Obstetrics and Gynecology

## 2022-04-13 NOTE — Patient Outreach (Signed)
Medicaid Managed Care   Nurse Care Manager Note  04/13/2022 Name:  [name] [name] MRN:  [id number] DOB:  [birth year]  [name] [name] is an 61 y.o. year old female who is a primary patient of [name] Rakes, MD.  The [facility] Managed Care Coordination team was consulted for assistance with:    Chronic healthcare management needs, DM, HTN, COPD, OSA, CKD, CHF, HLD  [name] [name] was given information about Medicaid Managed Care Coordination team services today. [name] [name] Patient agreed to services and verbal consent obtained.  Engaged with patient by telephone for follow up visit in response to provider referral for case management and/or care coordination services.   Assessments/Interventions:  Review of past medical history, allergies, medications, health status, including review of consultants reports, laboratory and other test data, was performed as part of comprehensive evaluation and provision of chronic care management services.  SDOH (Social Determinants of Health) assessments and interventions performed: SDOH Interventions    Flowsheet Row Patient Outreach Telephone from 04/13/2022 in [place] Patient Outreach Telephone from 02/04/2022 in [place] Patient Outreach Telephone from 12/29/2021 in [place] Patient Outreach Telephone from 11/26/2021 in [place] Patient Outreach Telephone from 10/22/2021 in [place] Patient Outreach Telephone from 09/21/2021 in [place] Interventions        Food Insecurity Interventions -- -- -- Intervention Not Indicated -- --  Housing Interventions -- -- -- -- Intervention Not Indicated --  Transportation Interventions -- -- -- Intervention Not Indicated -- --  Utilities Interventions  Intervention Not Indicated -- -- -- -- --  Financial Strain Interventions -- -- Intervention Not Indicated -- -- --  Physical Activity Interventions -- Other (Comments)  [patient not physically able to engage in a moderate to strenuous exercise] -- -- -- --  Stress Interventions Intervention Not Indicated -- -- -- -- Intervention Not Indicated      Care Plan  Allergies  Allergen Reactions   Lisinopril Swelling    Medications Reviewed Today     Reviewed by [name] Medicus, RN (Registered Nurse) on 04/13/22 at 1307  Med List Status: <None>   Medication Order Taking? Sig Documenting Provider Last Dose Status Informant  albuterol (VENTOLIN HFA) 108 (90 Base) MCG/ACT inhaler [id number] No Inhale 1 puff into the lungs every 4 (four) hours as needed for wheezing or shortness of breath.  Patient not taking: Reported on 03/23/2022   [name] Rakes, MD Not Taking Active Self, Spouse/Significant Other           Med Note [name] [name]   Tue Mar 16, 2022  2:29 PM)    amLODipine (NORVASC) 2.5 MG tablet [id number] No Take 1 tablet (2.5 mg total) by mouth daily. [name] Rakes, MD Taking Active   atorvastatin (LIPITOR) 40 MG tablet [id number] No Take 1 tablet (40 mg total) by mouth every morning. [name] Rakes, MD Taking Active   carvedilol (COREG) 25 MG tablet [id number] No Take 1 tablet (25 mg total) by mouth 2 (two) times daily. [name] Pandy, PA-C Taking Active            Med Note [name] [name]   Tue Mar 16, 2022  2:30 PM)    empagliflozin (JARDIANCE) 10 MG TABS tablet [id number] No Take 1 tablet (10 mg total) by mouth daily. [name] Rakes, MD Taking Active   famotidine (PEPCID)  40 MG tablet [id number] No TAKE ONE TABLET BY MOUTH daily [name] Rakes, MD Taking Active   ferrous sulfate 325 (65 FE) MG tablet [id number] No Take 325 mg by mouth daily with breakfast. [provider] Taking Active   hydrALAZINE (APRESOLINE) 50 MG tablet [id number] No TAKE ONE TABLET BY MOUTH  THREE TIMES DAILY [name] Rakes, MD Taking Active   methimazole (TAPAZOLE) 5 MG tablet [id number] No Take 1 tablet (5 mg total) by mouth 3 (three) times daily. [name] Rakes, MD Taking Active   mometasone-formoterol [facility]) 100-5 MCG/ACT AERO [id number] No Inhale 2 puffs into the lungs as needed for wheezing. [provider] Taking Active   nystatin ointment (MYCOSTATIN) [id number] No Apply 1 application. topically 2 (two) times daily as needed (rash/skin irritation). [name] Rakes, MD Taking Active            Med Note [name] [name], [name]   Tue Apr 06, 2022 11:23 AM) As needed  oxybutynin (DITROPAN) 5 MG tablet [id number] No Take 1 tablet (5 mg total) by mouth 2 (two) times daily. [name] Rakes, MD Taking Active   OZEMPIC, 0.25 OR 0.5 MG/DOSE, 2 MG/3ML SOPN [id number] No INJECT 0.5 MG into THE SKIN ONCE A WEEK Newlin, Enobong, MD Taking Active   pregabalin (LYRICA) 75 MG capsule [id number] No TAKE ONE CAPSULE BY MOUTH TWICE DAILY Newlin, Enobong, MD Taking Active   spironolactone (ALDACTONE) 25 MG tablet [id number] No Take 0.5 tablets (12.5 mg total) by mouth daily. [place], [name] Bo, FNP Taking Active   [name] [name] (DEMADEX) 20 MG tablet [id number] No Take 2 tablets (40 mg total) by mouth daily. [name] Dresser, MD Taking Active            Patient Active Problem List   Diagnosis Date Noted   Acute on chronic heart failure with preserved ejection fraction (HFpEF) ([place]) 09/22/2021   Heart failure ([place]) 09/22/2021   CKD (chronic kidney disease) stage 4, GFR 15-29 ml/min ([place]) 07/03/2021   Acute respiratory failure due to COVID-19 ([place]) 05/06/2021   COVID-19 05/05/2021   CHF exacerbation ([place]) 04/02/2021   Chest pain 04/02/2021   Acute-on-chronic kidney injury ([place]) 04/02/2021   COPD exacerbation ([place]) 12/05/2020   CKD (chronic kidney disease), stage III ([place]) 12/04/2020   Anemia of chronic disease 12/04/2020   Obesity, Class III, BMI 40-49.9 (morbid obesity) ([place]) 07/27/2020   Acute  respiratory disease due to COVID-19 virus 07/25/2020   Acute hypoxemic respiratory failure due to COVID-19 ([place]) 07/25/2020   Morbid obesity ([place]) 11/23/2019   Acute respiratory failure with hypoxia ([place]) 11/06/2019   Chronic kidney disease, stage 3b ([place]) 11/06/2019   COPD without exacerbation ([place])    Acute kidney failure ([place]) 08/18/2019   Left ventricular hypertrophy 07/21/2019   Educated about COVID-19 virus infection 35/70/1779   Acute diastolic HF (heart failure) (HCC)    Hypoxia    OSA (obstructive sleep apnea) 05/09/2019   Hyperglycemia 04/09/2019   Snoring 03/13/2019   Chronic anemia 08/17/2018   Chronic cystitis 08/03/2018   Diabetic neuropathy ([place]) 06/16/2017   Non compliance w medication regimen 04/12/2017   Family history of colon cancer in mother 11/17/2016   Dyspareunia in female 11/11/2016   Screen for colon cancer 11/11/2016   History of ovarian cyst 11/11/2016   Chronic hepatitis C without hepatic coma ([place]) 11/09/2016   Hyperlipidemia 10/07/2016   Type 2 diabetes mellitus with hyperlipidemia ([place]) 01/29/2014   Status post intraocular lens implant 11/08/2013   PCO (posterior capsular opacification) 09/28/2013   Pseudophakia 09/12/2013  GERD (gastroesophageal reflux disease) 09/06/2013   Hypertension 09/06/2013   ANEMIA, IRON DEFICIENCY 10/03/2009   LIPOMA 06/18/2009   TINEA PEDIS 05/07/2009   SUBSTANCE ABUSE, MULTIPLE 02/22/2007   HCVD (hypertensive cardiovascular disease) 02/22/2007   Conditions to be addressed/monitored per PCP order:  Chronic healthcare management needs, DM, HTN, COPD, OSA, CKD, CHF, HLD  Care Plan : General Plan of Care (Adult)  Updates made by [name] Medicus, RN since 04/13/2022 12:00 AM     Problem: Quality of Life (General Plan of Care)   Priority: High  Onset Date: 10/29/2020     Long-Range Goal: Quality of Life Maintained   Start Date: 10/29/2020  Expected End Date: 07/13/2022  Recent Progress: Not on track  Priority: High   Note:    CARE PLAN ENTRY Medicaid Managed Care (see longitudinal plan of care for additional care plan information)  Current Barriers:  Chronic case management needs related to chronic health conditions. 04/13/22:  Patient with no complaints today, continues to be followed by Paramedicine program. BP and BG stable, decreased swelling  Nurse Case Manager Clinical Goal(s):  Over the next 30 days, patient will verbalize understanding of plan for decreased swelling.  Over the next 30 days, patient will work with provider to address needs  Over the next 30 days, patient will continue to work with Pharmacist. Over the next 30 days, patient will attend all scheduled appointments.  Interventions:  Inter-disciplinary care team collaboration (see longitudinal plan of care) Reviewed medications with patient. Discussed plans with patient for ongoing care management follow up and provided patient with direct contact information for care management team. Collaboration with Pharmacist for review of medications. Pharmacy referral for medication review. Collaborated with BSW for resources. BSW  Referral for resources-completed Collaborated with PCP for GI referral at patient request Care Guide referral for Medicare information Collaborated with Care Guide.  Plan:  Patient will follow up with provider and fill all prescriptions RNCM will follow up with patient within 30  business days.   Follow Up:  Patient agrees to Care Plan and Follow-up.  Plan: The Managed Medicaid care management team will reach out to the patient again over the next 30 business  days. and The  Patient has been provided with contact information for the Managed Medicaid care management team and has been advised to call with any health related questions or concerns.  Date/time of next scheduled RN care management/care coordination outreach:  05/18/22 at 0900.

## 2022-04-13 NOTE — Patient Instructions (Signed)
HI [name] [name], nice to speak with you today-have a great afternoon!!  [name] [name] was given information about Medicaid Managed Care team care coordination services as a part of their [place] Medicaid benefit. [name] [name] verbally consented to engagement with the [facility] Managed Care team.   If you are experiencing a medical emergency, please call 911 or report to your local emergency department or urgent care.   If you have a non-emergency medical problem during routine business hours, please contact your provider's office and ask to speak with a nurse.   For questions related to your [facility], please call: [phone] or visit the homepage here: [email or web address]  If you would like to schedule transportation through your [facility], please call the following number at least 2 days in advance of your appointment: [phone]   Rides for urgent appointments can also be made after hours by calling Member Services.  Call the [facility] at [phone], at any time, 24 hours a day, 7 days a week. If you are in danger or need immediate medical attention call 911.  If you would like help to quit smoking, call 1-800-QUIT-NOW 938 253 7051) OR Espaol: 1-855-Djelo-Ya (3-818-299-3716) o para ms informacin haga clic aqu or Text READY to 200-400 to register via text  [name] [name] - following are the goals we discussed in your visit today:   Goals Addressed             This Visit's Progress    Protect My Health       Timeframe:  Long-Range Goal Priority:  Medium Start Date:          10/29/20                   Expected End Date: ongoing             Follow Up Date: 05/18/22   - schedule appointment for flu shot - schedule appointment for vaccines needed due to my age or health - schedule recommended health tests (blood work,  mammogram, colonoscopy, pap test) - schedule and keep appointment for annual check-up   04/13/22:  Patient being followed by Paramedicine weekly.  Seen and evaluated by CARDS 03/16/22    Why is this important?   Screening tests can find diseases early when they are easier to treat.  Your doctor or nurse will talk with you about which tests are important for you.  Getting shots for common diseases like the flu and shingles will help prevent them.     Patient verbalizes understanding of instructions and care plan provided today and agrees to view in [place]. Active MyChart status and patient understanding of how to access instructions and care plan via MyChart confirmed with patient.     The Managed Medicaid care management team will reach out to the patient again over the next 30 business  days.  The  Patient  has been provided with contact information for the Managed Medicaid care management team and has been advised to call with any health related questions or concerns.   [name] RN, BSN [place] Management Coordinator - Managed Medicaid High Risk [phone]   Following is a copy of your plan of care:  Care Plan : General Plan of Care (Adult)  Updates made by Gayla Medicus, RN since 04/13/2022 12:00 AM     Problem: Quality of Life (General Plan of Care)  Priority: High  Onset Date: 10/29/2020     Long-Range Goal: Quality of Life Maintained   Start Date: 10/29/2020  Expected End Date: 07/13/2022  Recent Progress: Not on track  Priority: High  Note:    CARE PLAN ENTRY Medicaid Managed Care (see longitudinal plan of care for additional care plan information)  Current Barriers:  Chronic case management needs related to chronic health conditions. 04/13/22:  Patient with no complaints today, continues to be followed by Paramedicine program. BP and BG stable, decreased swelling  Nurse Case Manager Clinical Goal(s):  Over the next 30 days,  patient will verbalize understanding of plan for decreased swelling.  Over the next 30 days, patient will work with provider to address needs  Over the next 30 days, patient will continue to work with Pharmacist. Over the next 30 days, patient will attend all scheduled appointments.  Interventions:  Inter-disciplinary care team collaboration (see longitudinal plan of care) Reviewed medications with patient. Discussed plans with patient for ongoing care management follow up and provided patient with direct contact information for care management team. Collaboration with Pharmacist for review of medications. Pharmacy referral for medication review. Collaborated with BSW for resources. BSW  Referral for resources-completed Collaborated with PCP for GI referral at patient request Care Guide referral for Medicare information Collaborated with Care Guide.  Plan:  Patient will follow up with provider and fill all prescriptions RNCM will follow up with patient within 30  business days.

## 2022-04-14 ENCOUNTER — Telehealth: Payer: Self-pay | Admitting: Family Medicine

## 2022-04-14 NOTE — Telephone Encounter (Signed)
..   Medicaid Managed Care   Unsuccessful Outreach Note  04/14/2022 Name: [name] [name] MRN: [id number] DOB: Mar 02, 1961  Referred by: [name], MD Reason for referral : High Risk Managed Medicaid (I called the patient today to get her scheduled with the MM BSW. I left my name and number on her VM.)   An unsuccessful telephone outreach was attempted today. The patient was referred to the case management team for assistance with care management and care coordination.   Follow Up Plan: The care management team will reach out to the patient again over the next 7 days.    [place]

## 2022-04-19 ENCOUNTER — Other Ambulatory Visit: Payer: Self-pay | Admitting: Family Medicine

## 2022-04-19 NOTE — Telephone Encounter (Signed)
Requested medication (s) are due for refill today:   Provider to review  Requested medication (s) are on the active medication list:   Yes  Future visit scheduled:   Yes   Last ordered: 02/26/2022 #90, 1 refill  Non delegated refill reason returned   Requested Prescriptions  Pending Prescriptions Disp Refills   methimazole (TAPAZOLE) 5 MG tablet [Pharmacy Med Name: methimazole 5 mg tablet] 90 tablet 1    Sig: TAKE ONE TABLET BY MOUTH three times daily     Not Delegated - Endocrinology:  Hyperthyroid Agents Failed - 04/19/2022  8:03 AM      Failed - This refill cannot be delegated      Failed - TSH in normal range and within 180 days    TSH  Date Value Ref Range Status  02/24/2022 <0.005 (L) 0.450 - 4.500 uIU/mL Final         Failed - T3 Total in normal range and within 180 days    T3, Free  Date Value Ref Range Status  02/24/2022 6.4 (H) 2.0 - 4.4 pg/mL Final         Failed - T4 free in normal range and within 180 days    Free T4  Date Value Ref Range Status  02/24/2022 2.81 (H) 0.82 - 1.77 ng/dL Final         Passed - Valid encounter within last 6 months    Recent Outpatient Visits           2 months ago 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 ([place])   [place], [place], MD   5 months ago Type 2 diabetes mellitus with other neurologic complication, with long-term current use of insulin ([place])   [place], Enobong, MD   6 months ago Hospital discharge follow-up   [place], [place], FNP   6 months ago Acute on chronic congestive heart failure, unspecified heart failure type [facility])   [facility] at [facility], Kriste Basque, NP   7 months ago Type 2 diabetes mellitus with other neurologic complication, with long-term current use of insulin ([place])   [place], Enobong,  MD       Future Appointments             In 1 month [name], MD [place]

## 2022-04-20 ENCOUNTER — Other Ambulatory Visit (HOSPITAL_COMMUNITY): Payer: Self-pay | Admitting: Emergency Medicine

## 2022-04-20 ENCOUNTER — Telehealth: Payer: Self-pay | Admitting: Family Medicine

## 2022-04-20 NOTE — Progress Notes (Signed)
Paramedicine Encounter    Patient ID: [name] [name], female    DOB: 12-Jun-1961, 60 y.o.   MRN: [id number]   LMP 04/01/2010  Weight yesterday-not taken Last visit weight-191.3lb  ATF [name] A&O x 4, skin W&D w/ good color.  Pt. Denies chest pain or SOB.  No edema noted.  Lung sounds clear and equal bilat.  She looks great today and is dressed and ready to run errands. She went out of town this past weekend to attend a funeral and took her meds with her.  She has done well to be compliant with same.  Her blood pressure was elevated today but she had not taken her morning meds before my visit. Today, I observed her filling her pill box. She had a few instances of confusion for the Wed. Slot that she missed last week and kept trying to add pills to that slot.  We'll work on that again next visit but overall she's doing well.  We also discussed to option of doing bubble packs and I will work on making a med list for her for the pharmacy just incase this is what she decides she would rather do. I also assisted her with calling Dr. Smitty Pluck office to get her set of for and A1-C lab test as [facility] is offering a $50 [employer] card for this wellness check.  The office advised a nurse would call her back and set up a lab appointment.   Refills called in for Oxybutin. Will be filled and delivered Thursday. Home visit complete.    [name], [place] 04/20/2022   Patient Care Team: [name], MD as PCP - General (Family Medicine) Minus Breeding, MD as PCP - Cardiology (Cardiology) Craft, Lorel Monaco, RN as Case Manager [place], Tami Lin, PA as Physician Assistant (Cardiology)  Patient Active Problem List   Diagnosis Date Noted   Acute on chronic heart failure with preserved ejection fraction (HFpEF) ([place]) 09/22/2021   Heart failure ([place]) 09/22/2021   CKD (chronic kidney disease) stage 4, GFR 15-29 ml/min ([place]) 07/03/2021   Acute respiratory failure due to COVID-19  ([place]) 05/06/2021   COVID-19 05/05/2021   CHF exacerbation ([place]) 04/02/2021   Chest pain 04/02/2021   Acute-on-chronic kidney injury ([place]) 04/02/2021   COPD exacerbation ([place]) 12/05/2020   CKD (chronic kidney disease), stage III ([place]) 12/04/2020   Anemia of chronic disease 12/04/2020   Obesity, Class III, BMI 40-49.9 (morbid obesity) ([place]) 07/27/2020   Acute respiratory disease due to COVID-19 virus 07/25/2020   Acute hypoxemic respiratory failure due to COVID-19 ([place]) 07/25/2020   Morbid obesity ([place]) 11/23/2019   Acute respiratory failure with hypoxia ([place]) 11/06/2019   Chronic kidney disease, stage 3b ([place]) 11/06/2019   COPD without exacerbation ([place])    Acute kidney failure ([place]) 08/18/2019   Left ventricular hypertrophy 07/21/2019   Educated about COVID-19 virus infection 34/19/3790   Acute diastolic HF (heart failure) (HCC)    Hypoxia    OSA (obstructive sleep apnea) 05/09/2019   Hyperglycemia 04/09/2019   Snoring 03/13/2019   Chronic anemia 08/17/2018   Chronic cystitis 08/03/2018   Diabetic neuropathy ([place]) 06/16/2017   Non compliance w medication regimen 04/12/2017   Family history of colon cancer in mother 11/17/2016   Dyspareunia in female 11/11/2016   Screen for colon cancer 11/11/2016   History of ovarian cyst 11/11/2016   Chronic hepatitis C without hepatic coma ([place]) 11/09/2016   Hyperlipidemia 10/07/2016   Type 2 diabetes mellitus with hyperlipidemia ([place])  01/29/2014   Status post intraocular lens implant 11/08/2013   PCO (posterior capsular opacification) 09/28/2013   Pseudophakia 09/12/2013   GERD (gastroesophageal reflux disease) 09/06/2013   Hypertension 09/06/2013   ANEMIA, IRON DEFICIENCY 10/03/2009   LIPOMA 06/18/2009   TINEA PEDIS 05/07/2009   SUBSTANCE ABUSE, MULTIPLE 02/22/2007   HCVD (hypertensive cardiovascular disease) 02/22/2007    Current Outpatient Medications:    albuterol (VENTOLIN HFA) 108 (90 Base) MCG/ACT inhaler, Inhale 1 puff into  the lungs every 4 (four) hours as needed for wheezing or shortness of breath. (Patient not taking: Reported on 03/23/2022), Disp: 18 g, Rfl: 0   amLODipine (NORVASC) 2.5 MG tablet, Take 1 tablet (2.5 mg total) by mouth daily., Disp: 90 tablet, Rfl: 1   atorvastatin (LIPITOR) 40 MG tablet, Take 1 tablet (40 mg total) by mouth every morning., Disp: 90 tablet, Rfl: 1   carvedilol (COREG) 25 MG tablet, Take 1 tablet (25 mg total) by mouth 2 (two) times daily., Disp: 180 tablet, Rfl: 1   empagliflozin (JARDIANCE) 10 MG TABS tablet, Take 1 tablet (10 mg total) by mouth daily., Disp: 90 tablet, Rfl: 1   famotidine (PEPCID) 40 MG tablet, TAKE ONE TABLET BY MOUTH daily, Disp: 30 tablet, Rfl: 3   ferrous sulfate 325 (65 FE) MG tablet, Take 325 mg by mouth daily with breakfast., Disp: , Rfl:    hydrALAZINE (APRESOLINE) 50 MG tablet, TAKE ONE TABLET BY MOUTH THREE TIMES DAILY, Disp: 270 tablet, Rfl: 1   methimazole (TAPAZOLE) 5 MG tablet, TAKE ONE TABLET BY MOUTH three times daily, Disp: 90 tablet, Rfl: 1   mometasone-formoterol (DULERA) 100-5 MCG/ACT AERO, Inhale 2 puffs into the lungs as needed for wheezing., Disp: , Rfl:    nystatin ointment (MYCOSTATIN), Apply 1 application. topically 2 (two) times daily as needed (rash/skin irritation)., Disp: 30 g, Rfl: 1   oxybutynin (DITROPAN) 5 MG tablet, Take 1 tablet (5 mg total) by mouth 2 (two) times daily., Disp: 60 tablet, Rfl: 6   OZEMPIC, 0.25 OR 0.5 MG/DOSE, 2 MG/3ML SOPN, INJECT 0.5 MG into THE SKIN ONCE A WEEK, Disp: 3 mL, Rfl: 1   pregabalin (LYRICA) 75 MG capsule, TAKE ONE CAPSULE BY MOUTH TWICE DAILY, Disp: 180 capsule, Rfl: 1   spironolactone (ALDACTONE) 25 MG tablet, Take 0.5 tablets (12.5 mg total) by mouth daily., Disp: 30 tablet, Rfl: 3   torsemide (DEMADEX) 20 MG tablet, Take 2 tablets (40 mg total) by mouth daily., Disp: 90 tablet, Rfl: 6 Allergies  Allergen Reactions   Lisinopril Swelling      Social History   Socioeconomic History    Marital status: Legally Separated    Spouse name: Not on file   Number of children: 1   Years of education: Not on file   Highest education level: 9th grade  Occupational History   Occupation: disability  Tobacco Use   Smoking status: Never   Smokeless tobacco: Never  Vaping Use   Vaping Use: Never used  Substance and Sexual Activity   Alcohol use: No   Drug use: Not Currently    Types: Cocaine, Marijuana    Comment: remote h/o cocaine 2015 and marijuana 2018   Sexual activity: Not Currently  Other Topics Concern   Not on file  Social History Narrative   Lives with friend, Rosalee Kaufman and nephew.  One daughter.    Social Determinants of Health   Financial Resource Strain: Low Risk  (12/29/2021)   Overall Financial Resource Strain (CARDIA)    Difficulty  of Paying Living Expenses: Not very hard  Food Insecurity: No Food Insecurity (11/26/2021)   Hunger Vital Sign    Worried About Running Out of Food in the Last Year: Never true    Ran Out of Food in the Last Year: Never true  Transportation Needs: No Transportation Needs (11/26/2021)   PRAPARE - Hydrologist (Medical): No    Lack of Transportation (Non-Medical): No  Recent Concern: Transportation Needs - Unmet Transportation Needs (08/31/2021)   PRAPARE - Transportation    Lack of Transportation (Medical): No    Lack of Transportation (Non-Medical): Yes  Physical Activity: Insufficiently Active (02/04/2022)   Exercise Vital Sign    Days of Exercise per Week: 1 day    Minutes of Exercise per Session: 30 min  Stress: No Stress Concern Present (04/13/2022)   [place]    Feeling of Stress : Not at all  Social Connections: Moderately Integrated (12/29/2021)   Social Connection and Isolation Panel [NHANES]    Frequency of Communication with Friends and Family: More than three times a week    Frequency of Social Gatherings with Friends and  Family: More than three times a week    Attends Religious Services: More than 4 times per year    Active Member of [employer] or Organizations: No    Attends Archivist Meetings: Never    Marital Status: Living with partner  Intimate Partner Violence: Not At Risk (02/04/2022)   Humiliation, Afraid, Rape, and Kick questionnaire    Fear of Current or Ex-Partner: No    Emotionally Abused: No    Physically Abused: No    Sexually Abused: No    Physical Exam      Future Appointments  Date Time Provider [place]  04/27/2022 11:00 AM [facility] CCC-MM SOCIAL WORKER 2 [facility]-CCC None  05/18/2022  9:00 AM [facility] CCC-MM CARE MANAGER 2 [facility]N-CCC None  05/31/2022 11:10 AM [name], MD CHW-CHWW None  06/16/2022 10:30 AM [facility] PA/NP [place] None       [name], [facility] Paramedic  04/20/22

## 2022-04-20 NOTE — Telephone Encounter (Signed)
Pt's home health nurse called in to request a A1C order  to be placed. Pt's insurance is giving a incentive to have this completed.    [phone] - please assist pt further with scheduling.

## 2022-04-22 NOTE — Telephone Encounter (Signed)
I will perform A1c at her visit with me.

## 2022-04-22 NOTE — Telephone Encounter (Signed)
Pt has appointment on 05/2022 to get A1C done

## 2022-04-22 NOTE — Telephone Encounter (Signed)
Noted  

## 2022-04-23 ENCOUNTER — Encounter: Payer: Self-pay | Admitting: Family Medicine

## 2022-04-23 DIAGNOSIS — E0500 Thyrotoxicosis with diffuse goiter without thyrotoxic crisis or storm: Secondary | ICD-10-CM | POA: Insufficient documentation

## 2022-04-23 DIAGNOSIS — E0590 Thyrotoxicosis, unspecified without thyrotoxic crisis or storm: Secondary | ICD-10-CM | POA: Insufficient documentation

## 2022-04-27 ENCOUNTER — Other Ambulatory Visit: Payer: Self-pay

## 2022-04-27 ENCOUNTER — Other Ambulatory Visit (HOSPITAL_COMMUNITY): Payer: Self-pay | Admitting: Emergency Medicine
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
## 2022-04-27 NOTE — Patient Instructions (Signed)
Visit Information  [name] [name] was given information about Medicaid Managed Care team care coordination services as a part of their [place] Medicaid benefit. [name] [name] verbally consented to engagement with the [facility] Managed Care team.   If you are experiencing a medical emergency, please call 911 or report to your local emergency department or urgent care.   If you have a non-emergency medical problem during routine business hours, please contact your provider's office and ask to speak with a nurse.   For questions related to your [facility], please call: [phone] or visit the homepage here: [email or web address]  If you would like to schedule transportation through your [facility], please call the following number at least 2 days in advance of your appointment: [phone]   Rides for urgent appointments can also be made after hours by calling Member Services.  Call the [place] at [phone], at any time, 24 hours a day, 7 days a week. If you are in danger or need immediate medical attention call 911.  If you would like help to quit smoking, call 1-800-QUIT-NOW (440)181-8895) OR Espaol: 1-855-Djelo-Ya (5-809-983-3825) o para ms informacin haga clic aqu or Text READY to 200-400 to register via text  [name] [name] - following are the goals we discussed in your visit today:   Goals Addressed   None      Social Worker will follow up in 10 days .   [name], BSW, [place] Managed Medicaid Team  [phone]   Following is a copy of your plan of care:  There are no care plans that you recently modified to display for this patient.

## 2022-04-27 NOTE — Progress Notes (Signed)
Paramedicine Encounter    Patient ID: [name] [name], female    DOB: 09/16/1960, 61 y.o.   MRN: [id number]   BP 130/78 (BP Location: Left Arm, Patient Position: Sitting, Cuff Size: Normal)   Pulse 73   Resp 16   Wt 194 lb (88 kg)   LMP 04/01/2010   SpO2 98%   BMI 33.30 kg/m  Weight yesterday-not taken Last visit weight-191lb   ATF [name] A&O x 4, skin W&D w/ good color.  Pt was in a great mood and reports to be feeling well.  She denies chest pain or SOB.  Lung sounds clear and equal bilat.  No edema noted. Prior to my arrival [name] attempted to fill her own pill box but had gotten off track and confused.  I deconstructed her meds and we went through it together and I supervised her do her meds correctly.  Called in  refill for Oxybutin to be delivered tomorrow from [facility].  She will need to put 1 tablet AM/PM for Sun, Mon and Tuesday.   Assisted [name] in  scheduling an appointment with Endocrinologist Rayetta Pigg in [place] for Nov. 10th  @ 10:15.  Messaged Dr. Margarita Rana to advise her the appointment had been scheduled. Home visit complete.    [name], [place] 04/27/2022   Patient Care Team: [name], MD as PCP - General (Family Medicine) Minus Breeding, MD as PCP - Cardiology (Cardiology) Craft, Lorel Monaco, RN as Case Manager [facility], Tami Lin, [place] as Physician Assistant (Cardiology) Ethelda Chick as Social Worker  Patient Active Problem List   Diagnosis Date Noted   Hyperthyroidism 04/23/2022   Acute on chronic heart failure with preserved ejection fraction (HFpEF) ([place]) 09/22/2021   Heart failure ([place]) 09/22/2021   CKD (chronic kidney disease) stage 4, GFR 15-29 ml/min ([place]) 07/03/2021   Acute respiratory failure due to COVID-19 ([place]) 05/06/2021   COVID-19 05/05/2021   CHF exacerbation ([place]) 04/02/2021   Chest pain 04/02/2021   Acute-on-chronic kidney injury ([place]) 04/02/2021   COPD exacerbation  ([place]) 12/05/2020   CKD (chronic kidney disease), stage III ([place]) 12/04/2020   Anemia of chronic disease 12/04/2020   Obesity, Class III, BMI 40-49.9 (morbid obesity) ([place]) 07/27/2020   Acute respiratory disease due to COVID-19 virus 07/25/2020   Acute hypoxemic respiratory failure due to COVID-19 ([place]) 07/25/2020   Morbid obesity ([place]) 11/23/2019   Acute respiratory failure with hypoxia ([place]) 11/06/2019   Chronic kidney disease, stage 3b ([place]) 11/06/2019   COPD without exacerbation ([place])    Acute kidney failure ([place]) 08/18/2019   Left ventricular hypertrophy 07/21/2019   Educated about COVID-19 virus infection 76/19/5093   Acute diastolic HF (heart failure) (HCC)    Hypoxia    OSA (obstructive sleep apnea) 05/09/2019   Hyperglycemia 04/09/2019   Snoring 03/13/2019   Chronic anemia 08/17/2018   Chronic cystitis 08/03/2018   Diabetic neuropathy ([place]) 06/16/2017   Non compliance w medication regimen 04/12/2017   Family history of colon cancer in mother 11/17/2016   Dyspareunia in female 11/11/2016   Screen for colon cancer 11/11/2016   History of ovarian cyst 11/11/2016   Chronic hepatitis C without hepatic coma ([place]) 11/09/2016   Hyperlipidemia 10/07/2016   Type 2 diabetes mellitus with hyperlipidemia ([place]) 01/29/2014   Status post intraocular lens implant 11/08/2013   PCO (posterior capsular opacification) 09/28/2013   Pseudophakia 09/12/2013   GERD (gastroesophageal reflux disease) 09/06/2013   Hypertension 09/06/2013   ANEMIA, IRON DEFICIENCY 10/03/2009  LIPOMA 06/18/2009   TINEA PEDIS 05/07/2009   SUBSTANCE ABUSE, MULTIPLE 02/22/2007   HCVD (hypertensive cardiovascular disease) 02/22/2007    Current Outpatient Medications:    amLODipine (NORVASC) 2.5 MG tablet, Take 1 tablet (2.5 mg total) by mouth daily., Disp: 90 tablet, Rfl: 1   atorvastatin (LIPITOR) 40 MG tablet, Take 1 tablet (40 mg total) by mouth every morning., Disp: 90 tablet, Rfl: 1   carvedilol (COREG) 25 MG  tablet, Take 1 tablet (25 mg total) by mouth 2 (two) times daily., Disp: 180 tablet, Rfl: 1   empagliflozin (JARDIANCE) 10 MG TABS tablet, Take 1 tablet (10 mg total) by mouth daily., Disp: 90 tablet, Rfl: 1   famotidine (PEPCID) 40 MG tablet, TAKE ONE TABLET BY MOUTH daily, Disp: 30 tablet, Rfl: 3   ferrous sulfate 325 (65 FE) MG tablet, Take 325 mg by mouth daily with breakfast., Disp: , Rfl:    hydrALAZINE (APRESOLINE) 50 MG tablet, TAKE ONE TABLET BY MOUTH THREE TIMES DAILY, Disp: 270 tablet, Rfl: 1   methimazole (TAPAZOLE) 5 MG tablet, TAKE ONE TABLET BY MOUTH three times daily, Disp: 90 tablet, Rfl: 1   oxybutynin (DITROPAN) 5 MG tablet, Take 1 tablet (5 mg total) by mouth 2 (two) times daily., Disp: 60 tablet, Rfl: 6   OZEMPIC, 0.25 OR 0.5 MG/DOSE, 2 MG/3ML SOPN, INJECT 0.5 MG into THE SKIN ONCE A WEEK, Disp: 3 mL, Rfl: 1   pregabalin (LYRICA) 75 MG capsule, TAKE ONE CAPSULE BY MOUTH TWICE DAILY, Disp: 180 capsule, Rfl: 1   spironolactone (ALDACTONE) 25 MG tablet, Take 0.5 tablets (12.5 mg total) by mouth daily., Disp: 30 tablet, Rfl: 3   torsemide (DEMADEX) 20 MG tablet, Take 2 tablets (40 mg total) by mouth daily., Disp: 90 tablet, Rfl: 6   albuterol (VENTOLIN HFA) 108 (90 Base) MCG/ACT inhaler, Inhale 1 puff into the lungs every 4 (four) hours as needed for wheezing or shortness of breath. (Patient not taking: Reported on 04/27/2022), Disp: 18 g, Rfl: 0   mometasone-formoterol (DULERA) 100-5 MCG/ACT AERO, Inhale 2 puffs into the lungs as needed for wheezing. (Patient not taking: Reported on 04/20/2022), Disp: , Rfl:    nystatin ointment (MYCOSTATIN), Apply 1 application. topically 2 (two) times daily as needed (rash/skin irritation). (Patient not taking: Reported on 04/20/2022), Disp: 30 g, Rfl: 1 Allergies  Allergen Reactions   Lisinopril Swelling      Social History   Socioeconomic History   Marital status: Legally Separated    Spouse name: Not on file   Number of children: 1    Years of education: Not on file   Highest education level: 9th grade  Occupational History   Occupation: disability  Tobacco Use   Smoking status: Never   Smokeless tobacco: Never  Vaping Use   Vaping Use: Never used  Substance and Sexual Activity   Alcohol use: No   Drug use: Not Currently    Types: Cocaine, Marijuana    Comment: remote h/o cocaine 2015 and marijuana 2018   Sexual activity: Not Currently  Other Topics Concern   Not on file  Social History Narrative   Lives with friend, Rosalee Kaufman and nephew.  One daughter.    Social Determinants of Health   Financial Resource Strain: Low Risk  (12/29/2021)   Overall Financial Resource Strain (CARDIA)    Difficulty of Paying Living Expenses: Not very hard  Food Insecurity: No Food Insecurity (11/26/2021)   Hunger Vital Sign    Worried About Running Out of  Food in the Last Year: Never true    [place] in the Last Year: Never true  Transportation Needs: No Transportation Needs (04/27/2022)   PRAPARE - Hydrologist (Medical): No    Lack of Transportation (Non-Medical): No  Physical Activity: Insufficiently Active (02/04/2022)   Exercise Vital Sign    Days of Exercise per Week: 1 day    Minutes of Exercise per Session: 30 min  Stress: No Stress Concern Present (04/13/2022)   [place]    Feeling of Stress : Not at all  Social Connections: Moderately Integrated (12/29/2021)   Social Connection and Isolation Panel [NHANES]    Frequency of Communication with Friends and Family: More than three times a week    Frequency of Social Gatherings with Friends and Family: More than three times a week    Attends Religious Services: More than 4 times per year    Active Member of [employer] or Organizations: No    Attends Archivist Meetings: Never    Marital Status: Living with partner  Intimate Partner Violence: Not At Risk (02/04/2022)    Humiliation, Afraid, Rape, and Kick questionnaire    Fear of Current or Ex-Partner: No    Emotionally Abused: No    Physically Abused: No    Sexually Abused: No    Physical Exam      Future Appointments  Date Time Provider [place]  05/11/2022 11:30 AM [facility] CCC-MM SOCIAL WORKER 2 THN-CCC None  05/18/2022  9:00 AM [facility] CCC-MM CARE MANAGER 2 THN-CCC None  05/28/2022 10:30 AM Brita Romp, NP REA-REA None  05/31/2022 11:10 AM [name], MD CHW-CHWW None  06/16/2022 10:30 AM [facility] PA/NP MC-[facility] None       [name], [place] Paramedic  04/27/22

## 2022-05-04 ENCOUNTER — Other Ambulatory Visit (HOSPITAL_COMMUNITY): Payer: Self-pay | Admitting: Emergency Medicine

## 2022-05-04 NOTE — Progress Notes (Signed)
Paramedicine Encounter    Patient ID: [name] [name], female    DOB: 04/21/1961, 61 y.o.   MRN: [id number]   LMP 04/01/2010  Weight yesterday-not taken Last visit weight-194lb   ATF [name] A&O x 4, skin W&D w/ good color.  She was still in her pajamas sitting at the dining room table.  It was obvious that she had been emotionally upset.  She advised that her aunt who was like a second mother to her had died last week and she has been having a hard time with her death.  She asked today if I could reconcile her pills for her as she just didn't feel like she could do it today.  Refills for Methimazole and Ozempic called in to [facility] and will be delivered tomorrow.   Pt. Denies chest pain or SOB.  Lung sounds clear and equal bilat and no edema noted.  She's down 2lbs since last weeks visit.  Home visit complete.    [name], [place] 05/04/2022    Patient Care Team: [name], MD as PCP - General (Family Medicine) Minus Breeding, MD as PCP - Cardiology (Cardiology) Craft, Lorel Monaco, RN as Case Manager [facility], Tami Lin, [place] as Physician Assistant (Cardiology) Ethelda Chick as Social Worker  Patient Active Problem List   Diagnosis Date Noted   Hyperthyroidism 04/23/2022   Acute on chronic heart failure with preserved ejection fraction (HFpEF) ([place]) 09/22/2021   Heart failure ([place]) 09/22/2021   CKD (chronic kidney disease) stage 4, GFR 15-29 ml/min ([place]) 07/03/2021   Acute respiratory failure due to COVID-19 ([place]) 05/06/2021   COVID-19 05/05/2021   CHF exacerbation ([place]) 04/02/2021   Chest pain 04/02/2021   Acute-on-chronic kidney injury ([place]) 04/02/2021   COPD exacerbation ([place]) 12/05/2020   CKD (chronic kidney disease), stage III ([place]) 12/04/2020   Anemia of chronic disease 12/04/2020   Obesity, Class III, BMI 40-49.9 (morbid obesity) ([place]) 07/27/2020   Acute respiratory disease due to COVID-19 virus 07/25/2020   Acute hypoxemic  respiratory failure due to COVID-19 ([place]) 07/25/2020   Morbid obesity ([place]) 11/23/2019   Acute respiratory failure with hypoxia ([place]) 11/06/2019   Chronic kidney disease, stage 3b ([place]) 11/06/2019   COPD without exacerbation ([place])    Acute kidney failure ([place]) 08/18/2019   Left ventricular hypertrophy 07/21/2019   Educated about COVID-19 virus infection 20/25/4270   Acute diastolic HF (heart failure) (HCC)    Hypoxia    OSA (obstructive sleep apnea) 05/09/2019   Hyperglycemia 04/09/2019   Snoring 03/13/2019   Chronic anemia 08/17/2018   Chronic cystitis 08/03/2018   Diabetic neuropathy ([place]) 06/16/2017   Non compliance w medication regimen 04/12/2017   Family history of colon cancer in mother 11/17/2016   Dyspareunia in female 11/11/2016   Screen for colon cancer 11/11/2016   History of ovarian cyst 11/11/2016   Chronic hepatitis C without hepatic coma ([place]) 11/09/2016   Hyperlipidemia 10/07/2016   Type 2 diabetes mellitus with hyperlipidemia ([place]) 01/29/2014   Status post intraocular lens implant 11/08/2013   PCO (posterior capsular opacification) 09/28/2013   Pseudophakia 09/12/2013   GERD (gastroesophageal reflux disease) 09/06/2013   Hypertension 09/06/2013   ANEMIA, IRON DEFICIENCY 10/03/2009   LIPOMA 06/18/2009   TINEA PEDIS 05/07/2009   SUBSTANCE ABUSE, MULTIPLE 02/22/2007   HCVD (hypertensive cardiovascular disease) 02/22/2007    Current Outpatient Medications:    albuterol (VENTOLIN HFA) 108 (90 Base) MCG/ACT inhaler, Inhale 1 puff into the lungs every 4 (four) hours as needed  for wheezing or shortness of breath. (Patient not taking: Reported on 04/27/2022), Disp: 18 g, Rfl: 0   amLODipine (NORVASC) 2.5 MG tablet, Take 1 tablet (2.5 mg total) by mouth daily., Disp: 90 tablet, Rfl: 1   atorvastatin (LIPITOR) 40 MG tablet, Take 1 tablet (40 mg total) by mouth every morning., Disp: 90 tablet, Rfl: 1   carvedilol (COREG) 25 MG tablet, Take 1 tablet (25 mg total) by mouth  2 (two) times daily., Disp: 180 tablet, Rfl: 1   empagliflozin (JARDIANCE) 10 MG TABS tablet, Take 1 tablet (10 mg total) by mouth daily., Disp: 90 tablet, Rfl: 1   famotidine (PEPCID) 40 MG tablet, TAKE ONE TABLET BY MOUTH daily, Disp: 30 tablet, Rfl: 3   ferrous sulfate 325 (65 FE) MG tablet, Take 325 mg by mouth daily with breakfast., Disp: , Rfl:    hydrALAZINE (APRESOLINE) 50 MG tablet, TAKE ONE TABLET BY MOUTH THREE TIMES DAILY, Disp: 270 tablet, Rfl: 1   methimazole (TAPAZOLE) 5 MG tablet, TAKE ONE TABLET BY MOUTH three times daily, Disp: 90 tablet, Rfl: 1   mometasone-formoterol (DULERA) 100-5 MCG/ACT AERO, Inhale 2 puffs into the lungs as needed for wheezing. (Patient not taking: Reported on 04/20/2022), Disp: , Rfl:    nystatin ointment (MYCOSTATIN), Apply 1 application. topically 2 (two) times daily as needed (rash/skin irritation). (Patient not taking: Reported on 04/20/2022), Disp: 30 g, Rfl: 1   oxybutynin (DITROPAN) 5 MG tablet, Take 1 tablet (5 mg total) by mouth 2 (two) times daily., Disp: 60 tablet, Rfl: 6   OZEMPIC, 0.25 OR 0.5 MG/DOSE, 2 MG/3ML SOPN, INJECT 0.5 MG into THE SKIN ONCE A WEEK, Disp: 3 mL, Rfl: 1   pregabalin (LYRICA) 75 MG capsule, TAKE ONE CAPSULE BY MOUTH TWICE DAILY, Disp: 180 capsule, Rfl: 1   spironolactone (ALDACTONE) 25 MG tablet, Take 0.5 tablets (12.5 mg total) by mouth daily., Disp: 30 tablet, Rfl: 3   torsemide (DEMADEX) 20 MG tablet, Take 2 tablets (40 mg total) by mouth daily., Disp: 90 tablet, Rfl: 6 Allergies  Allergen Reactions   Lisinopril Swelling      Social History   Socioeconomic History   Marital status: Legally Separated    Spouse name: Not on file   Number of children: 1   Years of education: Not on file   Highest education level: 9th grade  Occupational History   Occupation: disability  Tobacco Use   Smoking status: Never   Smokeless tobacco: Never  Vaping Use   Vaping Use: Never used  Substance and Sexual Activity   Alcohol  use: No   Drug use: Not Currently    Types: Cocaine, Marijuana    Comment: remote h/o cocaine 2015 and marijuana 2018   Sexual activity: Not Currently  Other Topics Concern   Not on file  Social History Narrative   Lives with friend, Rosalee Kaufman and nephew.  One daughter.    Social Determinants of Health   Financial Resource Strain: Low Risk  (12/29/2021)   Overall Financial Resource Strain (CARDIA)    Difficulty of Paying Living Expenses: Not very hard  Food Insecurity: No Food Insecurity (11/26/2021)   Hunger Vital Sign    Worried About Running Out of Food in the Last Year: Never true    Ran Out of Food in the Last Year: Never true  Transportation Needs: No Transportation Needs (04/27/2022)   PRAPARE - Hydrologist (Medical): No    Lack of Transportation (Non-Medical): No  Physical Activity: Insufficiently Active (02/04/2022)   Exercise Vital Sign    Days of Exercise per Week: 1 day    Minutes of Exercise per Session: 30 min  Stress: No Stress Concern Present (04/13/2022)   [place]    Feeling of Stress : Not at all  Social Connections: Moderately Integrated (12/29/2021)   Social Connection and Isolation Panel [NHANES]    Frequency of Communication with Friends and Family: More than three times a week    Frequency of Social Gatherings with Friends and Family: More than three times a week    Attends Religious Services: More than 4 times per year    Active Member of [employer] or Organizations: No    Attends Archivist Meetings: Never    Marital Status: Living with partner  Intimate Partner Violence: Not At Risk (02/04/2022)   Humiliation, Afraid, Rape, and Kick questionnaire    Fear of Current or Ex-Partner: No    Emotionally Abused: No    Physically Abused: No    Sexually Abused: No    Physical Exam      Future Appointments  Date Time Provider [place]  05/11/2022  11:30 AM [facility] CCC-MM SOCIAL WORKER 2 [facility]-CCC None  05/18/2022  9:00 AM [facility] CCC-MM CARE MANAGER 2 THN-CCC None  05/28/2022 10:30 AM Brita Romp, NP REA-REA None  05/31/2022 11:10 AM [name], MD CHW-CHWW None  06/16/2022 10:30 AM [facility] PA/NP MC-[facility] None       [name], [place] Paramedic  05/04/22

## 2022-05-11 ENCOUNTER — Other Ambulatory Visit: Payer: Self-pay

## 2022-05-11 ENCOUNTER — Other Ambulatory Visit (HOSPITAL_COMMUNITY): Payer: Self-pay | Admitting: Emergency Medicine

## 2022-05-11 NOTE — Patient Instructions (Signed)
Visit Information  Ms. [name]  - as a part of your Medicaid benefit, you are eligible for care management and care coordination services at no cost or copay. I was unable to reach you by phone today but would be happy to help you with your health related needs. Please feel free to call me @ [phone].   A member of the Managed Medicaid care management team will reach out to you again over the next 7 days.   [name], BSW, [place] Managed Medicaid Team  [phone]

## 2022-05-11 NOTE — Patient Outreach (Signed)
Care Coordination  05/11/2022  [name] 1960-12-06 [id number]    Medicaid Managed Care   Unsuccessful Outreach Note  05/11/2022 Name: [name] [name] MRN: [id number] DOB: 12/30/1960  Referred by: [name], MD Reason for referral : High Risk Managed Medicaid (MM social work telephone outreach )   An unsuccessful telephone outreach was attempted today. The patient was referred to the case management team for assistance with care management and care coordination.   Follow Up Plan: The care management team will reach out to the patient again over the next 30 days.   [name], BSW, [place] Managed Medicaid Team  [phone]

## 2022-05-11 NOTE — Progress Notes (Signed)
Paramedicine Encounter    Patient ID: [name] [name], female    DOB: 1960/12/30, 61 y.o.   MRN: [id number]   BP (!) 158/90 (BP Location: Right Arm, Patient Position: Sitting, Cuff Size: Normal)   Pulse 74   Wt 197 lb (89.4 kg)   LMP 04/01/2010   SpO2 98%   BMI 33.81 kg/m  Weight yesterday-not taken Last visit weight-192lb  ATF [name] A&O x 4, skin W&D w/ good color.  Pt denies chest pain or SOB.  Lung sounds clear and equal bilat.  No edema noted.  She is up 5lbs from last visit.  She advises she has family visiting and that they have been cooking dinners for her this past week so she has eaten much more than usual.  Her blood pressure is elevated today but she has not yet taken her morning meds.  This is no a new thing for her.  She also states she had a stressful event with one of her kids today at school and says that go her worked up. Today, I supervised [name] fill her own pill box without incident.  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  Home visit complete.    [name], [place] 05/11/2022    Patient Care Team: [name], MD as PCP - General (Family Medicine) Minus Breeding, MD as PCP - Cardiology (Cardiology) Craft, Lorel Monaco, RN as Case Manager [place], Tami Lin, [place] as Physician Assistant (Cardiology) Ethelda Chick as Social Worker  Patient Active Problem List   Diagnosis Date Noted   Hyperthyroidism 04/23/2022   Acute on chronic heart failure with preserved ejection fraction (HFpEF) ([place]) 09/22/2021   Heart failure ([place]) 09/22/2021   CKD (chronic kidney disease) stage 4, GFR 15-29 ml/min ([place]) 07/03/2021   Acute respiratory failure due to COVID-19 ([place]) 05/06/2021   COVID-19 05/05/2021   CHF exacerbation ([place]) 04/02/2021   Chest pain 04/02/2021   Acute-on-chronic kidney injury ([place]) 04/02/2021   COPD exacerbation ([place]) 12/05/2020   CKD (chronic kidney disease), stage III  ([place]) 12/04/2020   Anemia of chronic disease 12/04/2020   Obesity, Class III, BMI 40-49.9 (morbid obesity) ([place]) 07/27/2020   Acute respiratory disease due to COVID-19 virus 07/25/2020   Acute hypoxemic respiratory failure due to COVID-19 ([place]) 07/25/2020   Morbid obesity ([place]) 11/23/2019   Acute respiratory failure with hypoxia ([place]) 11/06/2019   Chronic kidney disease, stage 3b ([place]) 11/06/2019   COPD without exacerbation ([place])    Acute kidney failure ([place]) 08/18/2019   Left ventricular hypertrophy 07/21/2019   Educated about COVID-19 virus infection 38/25/0539   Acute diastolic HF (heart failure) ([place])    Hypoxia    OSA (obstructive sleep apnea) 05/09/2019   Hyperglycemia 04/09/2019   Snoring 03/13/2019   Chronic anemia 08/17/2018   Chronic cystitis 08/03/2018   Diabetic neuropathy ([place]) 06/16/2017   Non compliance w medication regimen 04/12/2017   Family history of colon cancer in mother 11/17/2016   Dyspareunia in female 11/11/2016   Screen for colon cancer 11/11/2016   History of ovarian cyst 11/11/2016   Chronic hepatitis C without hepatic coma ([place]) 11/09/2016   Hyperlipidemia 10/07/2016   Type 2 diabetes mellitus with hyperlipidemia ([place]) 01/29/2014   Status post intraocular lens implant 11/08/2013   PCO (posterior capsular opacification) 09/28/2013   Pseudophakia 09/12/2013   GERD (gastroesophageal reflux disease) 09/06/2013   Hypertension 09/06/2013   ANEMIA, IRON DEFICIENCY 10/03/2009   LIPOMA 06/18/2009  TINEA PEDIS 05/07/2009   SUBSTANCE ABUSE, MULTIPLE 02/22/2007   HCVD (hypertensive cardiovascular disease) 02/22/2007    Current Outpatient Medications:    amLODipine (NORVASC) 2.5 MG tablet, Take 1 tablet (2.5 mg total) by mouth daily., Disp: 90 tablet, Rfl: 1   atorvastatin (LIPITOR) 40 MG tablet, Take 1 tablet (40 mg total) by mouth every morning., Disp: 90 tablet, Rfl: 1   empagliflozin (JARDIANCE) 10 MG TABS tablet, Take 1 tablet (10 mg total) by mouth  daily., Disp: 90 tablet, Rfl: 1   famotidine (PEPCID) 40 MG tablet, TAKE ONE TABLET BY MOUTH daily, Disp: 30 tablet, Rfl: 3   ferrous sulfate 325 (65 FE) MG tablet, Take 325 mg by mouth daily with breakfast., Disp: , Rfl:    hydrALAZINE (APRESOLINE) 50 MG tablet, TAKE ONE TABLET BY MOUTH THREE TIMES DAILY, Disp: 270 tablet, Rfl: 1   methimazole (TAPAZOLE) 5 MG tablet, TAKE ONE TABLET BY MOUTH three times daily, Disp: 90 tablet, Rfl: 1   oxybutynin (DITROPAN) 5 MG tablet, Take 1 tablet (5 mg total) by mouth 2 (two) times daily., Disp: 60 tablet, Rfl: 6   OZEMPIC, 0.25 OR 0.5 MG/DOSE, 2 MG/3ML SOPN, INJECT 0.5 MG into THE SKIN ONCE A WEEK, Disp: 3 mL, Rfl: 1   pregabalin (LYRICA) 75 MG capsule, TAKE ONE CAPSULE BY MOUTH TWICE DAILY, Disp: 180 capsule, Rfl: 1   spironolactone (ALDACTONE) 25 MG tablet, Take 0.5 tablets (12.5 mg total) by mouth daily., Disp: 30 tablet, Rfl: 3   torsemide (DEMADEX) 20 MG tablet, Take 2 tablets (40 mg total) by mouth daily., Disp: 90 tablet, Rfl: 6   albuterol (VENTOLIN HFA) 108 (90 Base) MCG/ACT inhaler, Inhale 1 puff into the lungs every 4 (four) hours as needed for wheezing or shortness of breath. (Patient not taking: Reported on 04/27/2022), Disp: 18 g, Rfl: 0   carvedilol (COREG) 25 MG tablet, Take 1 tablet (25 mg total) by mouth 2 (two) times daily., Disp: 180 tablet, Rfl: 1   mometasone-formoterol (DULERA) 100-5 MCG/ACT AERO, Inhale 2 puffs into the lungs as needed for wheezing. (Patient not taking: Reported on 04/20/2022), Disp: , Rfl:    nystatin ointment (MYCOSTATIN), Apply 1 application. topically 2 (two) times daily as needed (rash/skin irritation). (Patient not taking: Reported on 04/20/2022), Disp: 30 g, Rfl: 1 Allergies  Allergen Reactions   Lisinopril Swelling      Social History   Socioeconomic History   Marital status: Legally Separated    Spouse name: Not on file   Number of children: 1   Years of education: Not on file   Highest education  level: 9th grade  Occupational History   Occupation: disability  Tobacco Use   Smoking status: Never   Smokeless tobacco: Never  Vaping Use   Vaping Use: Never used  Substance and Sexual Activity   Alcohol use: No   Drug use: Not Currently    Types: Cocaine, Marijuana    Comment: remote h/o cocaine 2015 and marijuana 2018   Sexual activity: Not Currently  Other Topics Concern   Not on file  Social History Narrative   Lives with friend, Rosalee Kaufman and nephew.  One daughter.    Social Determinants of Health   Financial Resource Strain: Low Risk  (12/29/2021)   Overall Financial Resource Strain (CARDIA)    Difficulty of Paying Living Expenses: Not very hard  Food Insecurity: No Food Insecurity (11/26/2021)   Hunger Vital Sign    Worried About Running Out of Food in the Last  Year: Never true    [place] in the Last Year: Never true  Transportation Needs: No Transportation Needs (04/27/2022)   PRAPARE - Hydrologist (Medical): No    Lack of Transportation (Non-Medical): No  Physical Activity: Insufficiently Active (02/04/2022)   Exercise Vital Sign    Days of Exercise per Week: 1 day    Minutes of Exercise per Session: 30 min  Stress: No Stress Concern Present (04/13/2022)   [place]    Feeling of Stress : Not at all  Social Connections: Moderately Integrated (12/29/2021)   Social Connection and Isolation Panel [NHANES]    Frequency of Communication with Friends and Family: More than three times a week    Frequency of Social Gatherings with Friends and Family: More than three times a week    Attends Religious Services: More than 4 times per year    Active Member of [employer] or Organizations: No    Attends Archivist Meetings: Never    Marital Status: Living with partner  Intimate Partner Violence: Not At Risk (02/04/2022)   Humiliation, Afraid, Rape, and Kick questionnaire     Fear of Current or Ex-Partner: No    Emotionally Abused: No    Physically Abused: No    Sexually Abused: No    Physical Exam      Future Appointments  Date Time Provider [place]  05/18/2022  9:00 AM [facility] CCC-MM CARE MANAGER 2 THN-CCC None  05/28/2022 10:30 AM Brita Romp, NP REA-REA None  05/31/2022 11:10 AM [name], MD CHW-CHWW None  06/02/2022  9:20 AM GI-BCG DIAG TOMO 1 GI-BCGMM GI-BREAST CE  06/02/2022  9:30 AM GI-BCG [place] 1 GI-BCGUS GI-BREAST CE  06/02/2022  9:35 AM GI-BCG [place] 1 GI-BCGUS GI-BREAST CE  06/16/2022 10:30 AM MC-HVSC PA/NP MC-HVSC None       [name], [place] Paramedic  05/11/22

## 2022-05-18 ENCOUNTER — Other Ambulatory Visit: Payer: Self-pay | Admitting: Obstetrics and Gynecology

## 2022-05-18 ENCOUNTER — Other Ambulatory Visit (HOSPITAL_COMMUNITY): Payer: Self-pay | Admitting: Emergency Medicine

## 2022-05-18 NOTE — Patient Outreach (Signed)
Care Coordination  05/18/2022  [name] [name] 03-Dec-1960 [id number]   Medicaid Managed Care   Unsuccessful Outreach Note  05/18/2022 Name: [name] [name] MRN: [id number] DOB: 01-09-1961  Referred by: [name], MD Reason for referral : High Risk Managed Medicaid (Unsuccessful telephone outreach)   An unsuccessful telephone outreach was attempted today. The patient was referred to the case management team for assistance with care management and care coordination.   Follow Up Plan: The care management team will reach out to the patient again over the next 30 business  days.   [name] RN, BSN [place]  [facility] Curator - Managed Medicaid High Risk [phone]

## 2022-05-18 NOTE — Progress Notes (Signed)
Paramedicine Encounter    Patient ID: [name] [name], female    DOB: 1960/08/05, 61 y.o.   MRN: [id number]   LMP 04/01/2010  Weight yesterday-not taken Last visit weight-192lbs    Patient Care Team: [name], MD as PCP - General (Family Medicine) Minus Breeding, MD as PCP - Cardiology (Cardiology) Craft, Lorel Monaco, RN as Case Manager [place], Tami Lin, [place] as Physician Assistant (Cardiology) Ethelda Chick as Social Worker  Patient Active Problem List   Diagnosis Date Noted  . Hyperthyroidism 04/23/2022  . Acute on chronic heart failure with preserved ejection fraction (HFpEF) ([place]) 09/22/2021  . Heart failure ([place]) 09/22/2021  . CKD (chronic kidney disease) stage 4, GFR 15-29 ml/min (HCC) 07/03/2021  . Acute respiratory failure due to COVID-19 ([place]) 05/06/2021  . COVID-19 05/05/2021  . CHF exacerbation ([place]) 04/02/2021  . Chest pain 04/02/2021  . Acute-on-chronic kidney injury ([place]) 04/02/2021  . COPD exacerbation ([place]) 12/05/2020  . CKD (chronic kidney disease), stage III ([place]) 12/04/2020  . Anemia of chronic disease 12/04/2020  . Obesity, Class III, BMI 40-49.9 (morbid obesity) ([place]) 07/27/2020  . Acute respiratory disease due to COVID-19 virus 07/25/2020  . Acute hypoxemic respiratory failure due to COVID-19 ([place]) 07/25/2020  . Morbid obesity ([place]) 11/23/2019  . Acute respiratory failure with hypoxia ([place]) 11/06/2019  . Chronic kidney disease, stage 3b ([place]) 11/06/2019  . COPD without exacerbation ([place])   . Acute kidney failure ([place]) 08/18/2019  . Left ventricular hypertrophy 07/21/2019  . Educated about COVID-19 virus infection 07/21/2019  . Acute diastolic HF (heart failure) ([place])   . Hypoxia   . OSA (obstructive sleep apnea) 05/09/2019  . Hyperglycemia 04/09/2019  . Snoring 03/13/2019  . Chronic anemia 08/17/2018  . Chronic cystitis 08/03/2018  . Diabetic neuropathy ([place]) 06/16/2017  . Non compliance w medication regimen 04/12/2017  . Family history  of colon cancer in mother 11/17/2016  . Dyspareunia in female 11/11/2016  . Screen for colon cancer 11/11/2016  . History of ovarian cyst 11/11/2016  . Chronic hepatitis C without hepatic coma ([place]) 11/09/2016  . Hyperlipidemia 10/07/2016  . Type 2 diabetes mellitus with hyperlipidemia ([place]) 01/29/2014  . Status post intraocular lens implant 11/08/2013  . PCO (posterior capsular opacification) 09/28/2013  . Pseudophakia 09/12/2013  . GERD (gastroesophageal reflux disease) 09/06/2013  . Hypertension 09/06/2013  . ANEMIA, IRON DEFICIENCY 10/03/2009  . LIPOMA 06/18/2009  . TINEA PEDIS 05/07/2009  . SUBSTANCE ABUSE, MULTIPLE 02/22/2007  . HCVD (hypertensive cardiovascular disease) 02/22/2007    Current Outpatient Medications:  .  albuterol (VENTOLIN HFA) 108 (90 Base) MCG/ACT inhaler, Inhale 1 puff into the lungs every 4 (four) hours as needed for wheezing or shortness of breath. (Patient not taking: Reported on 04/27/2022), Disp: 18 g, Rfl: 0 .  amLODipine (NORVASC) 2.5 MG tablet, Take 1 tablet (2.5 mg total) by mouth daily., Disp: 90 tablet, Rfl: 1 .  atorvastatin (LIPITOR) 40 MG tablet, Take 1 tablet (40 mg total) by mouth every morning., Disp: 90 tablet, Rfl: 1 .  carvedilol (COREG) 25 MG tablet, Take 1 tablet (25 mg total) by mouth 2 (two) times daily., Disp: 180 tablet, Rfl: 1 .  empagliflozin (JARDIANCE) 10 MG TABS tablet, Take 1 tablet (10 mg total) by mouth daily., Disp: 90 tablet, Rfl: 1 .  famotidine (PEPCID) 40 MG tablet, TAKE ONE TABLET BY MOUTH daily, Disp: 30 tablet, Rfl: 3 .  ferrous sulfate 325 (65 FE) MG tablet, Take 325 mg by mouth daily with breakfast., Disp: ,  Rfl:  .  hydrALAZINE (APRESOLINE) 50 MG tablet, TAKE ONE TABLET BY MOUTH THREE TIMES DAILY, Disp: 270 tablet, Rfl: 1 .  methimazole (TAPAZOLE) 5 MG tablet, TAKE ONE TABLET BY MOUTH three times daily, Disp: 90 tablet, Rfl: 1 .  mometasone-formoterol (DULERA) 100-5 MCG/ACT AERO, Inhale 2 puffs into the lungs as  needed for wheezing. (Patient not taking: Reported on 04/20/2022), Disp: , Rfl:  .  nystatin ointment (MYCOSTATIN), Apply 1 application. topically 2 (two) times daily as needed (rash/skin irritation). (Patient not taking: Reported on 04/20/2022), Disp: 30 g, Rfl: 1 .  oxybutynin (DITROPAN) 5 MG tablet, Take 1 tablet (5 mg total) by mouth 2 (two) times daily., Disp: 60 tablet, Rfl: 6 .  OZEMPIC, 0.25 OR 0.5 MG/DOSE, 2 MG/3ML SOPN, INJECT 0.5 MG into THE SKIN ONCE A WEEK, Disp: 3 mL, Rfl: 1 .  pregabalin (LYRICA) 75 MG capsule, TAKE ONE CAPSULE BY MOUTH TWICE DAILY, Disp: 180 capsule, Rfl: 1 .  spironolactone (ALDACTONE) 25 MG tablet, Take 0.5 tablets (12.5 mg total) by mouth daily., Disp: 30 tablet, Rfl: 3 .  torsemide (DEMADEX) 20 MG tablet, Take 2 tablets (40 mg total) by mouth daily., Disp: 90 tablet, Rfl: 6 Allergies  Allergen Reactions  . Lisinopril Swelling      Social History   Socioeconomic History  . Marital status: Legally Separated    Spouse name: Not on file  . Number of children: 1  . Years of education: Not on file  . Highest education level: 9th grade  Occupational History  . Occupation: disability  Tobacco Use  . Smoking status: Never  . Smokeless tobacco: Never  Vaping Use  . Vaping Use: Never used  Substance and Sexual Activity  . Alcohol use: No  . Drug use: Not Currently    Types: Cocaine, Marijuana    Comment: remote h/o cocaine 2015 and marijuana 2018  . Sexual activity: Not Currently  Other Topics Concern  . Not on file  Social History Narrative   Lives with friend, Rosalee Kaufman and nephew.  One daughter.    Social Determinants of Health   Financial Resource Strain: Low Risk  (12/29/2021)   Overall Financial Resource Strain (CARDIA)   . Difficulty of Paying Living Expenses: Not very hard  Food Insecurity: No Food Insecurity (11/26/2021)   Hunger Vital Sign   . Worried About Charity fundraiser in the Last Year: Never true   . Ran Out of Food in the Last  Year: Never true  Transportation Needs: No Transportation Needs (04/27/2022)   PRAPARE - Transportation   . Lack of Transportation (Medical): No   . Lack of Transportation (Non-Medical): No  Physical Activity: Insufficiently Active (02/04/2022)   Exercise Vital Sign   . Days of Exercise per Week: 1 day   . Minutes of Exercise per Session: 30 min  Stress: No Stress Concern Present (04/13/2022)   [place]   . Feeling of Stress : Not at all  Social Connections: Moderately Integrated (12/29/2021)   Social Connection and Isolation Panel [NHANES]   . Frequency of Communication with Friends and Family: More than three times a week   . Frequency of Social Gatherings with Friends and Family: More than three times a week   . Attends Religious Services: More than 4 times per year   . Active Member of Clubs or Organizations: No   . Attends Archivist Meetings: Never   . Marital Status: Living with  partner  Intimate Partner Violence: Not At Risk (02/04/2022)   Humiliation, Afraid, Rape, and Kick questionnaire   . Fear of Current or Ex-Partner: No   . Emotionally Abused: No   . Physically Abused: No   . Sexually Abused: No    Physical Exam      Future Appointments  Date Time Provider [place]  05/28/2022 10:30 AM Brita Romp, NP REA-REA None  05/31/2022 11:10 AM [name], MD CHW-CHWW None  06/02/2022  9:20 AM GI-BCG DIAG TOMO 1 GI-BCGMM GI-BREAST CE  06/02/2022  9:30 AM GI-BCG [place] 1 GI-BCGUS GI-BREAST CE  06/02/2022  9:35 AM GI-BCG [place] 1 GI-BCGUS GI-BREAST CE  06/16/2022 10:30 AM MC-HVSC PA/NP MC-HVSC None  06/18/2022  9:00 AM Craft, Lorel Monaco, RN [place], [place] [facility] Paramedic  05/18/22

## 2022-05-18 NOTE — Patient Instructions (Signed)
Hi Ms. Smucker, sorry to have missed you today-I hope you are doing well - as a part of your Medicaid benefit, you are eligible for care management and care coordination services at no cost or copay. I was unable to reach you by phone today but would be happy to help you with your health related needs. Please feel free to call me at [phone]  A member of the Managed Medicaid care management team will reach out to you again over the next 30 business days.   [name] RN, BSN [place]  [facility] Curator - Managed Medicaid High Risk [phone]

## 2022-05-25 ENCOUNTER — Other Ambulatory Visit (HOSPITAL_COMMUNITY): Payer: Self-pay | Admitting: Emergency Medicine

## 2022-05-25 NOTE — Progress Notes (Signed)
Paramedicine Encounter    Patient ID: [name] [name], female    DOB: 11-18-1960, 61 y.o.   MRN: [id number]   BP 130/80 (BP Location: Right Arm, Patient Position: Sitting, Cuff Size: Normal)   Pulse 68   Resp 14   Wt 195 lb 3.2 oz (88.5 kg)   LMP 04/01/2010   SpO2 95%   BMI 33.51 kg/m  Weight yesterday-not taken Last visit weight-193.lb  Home visit with [name] [name] today A&O x 4, skin W&D w/ good color.  She denies chest pain or SOB. Lung sounds clear and equal bilat.  No edema noted.  She missed only 1 dose of meds out of the whole week. Supervised [name] [name] refilling her  med box. Refill for Oxybutin called in to [facility].   Home visit complete.    [name] [name], [place] 05/25/2022  Patient Care Team: [name] Rakes, MD as PCP - General (Family Medicine) [name] Breeding, MD as PCP - Cardiology (Cardiology) Craft, Lorel Monaco, RN as Case Manager [name] [name], [name] [name] as Physician Assistant (Cardiology) [name] [name] as Social Worker  Patient Active Problem List   Diagnosis Date Noted   Hyperthyroidism 04/23/2022   Acute on chronic heart failure with preserved ejection fraction (HFpEF) ([place]) 09/22/2021   Heart failure ([place]) 09/22/2021   CKD (chronic kidney disease) stage 4, GFR 15-29 ml/min ([place]) 07/03/2021   Acute respiratory failure due to COVID-19 ([place]) 05/06/2021   COVID-19 05/05/2021   CHF exacerbation ([place]) 04/02/2021   Chest pain 04/02/2021   Acute-on-chronic kidney injury ([place]) 04/02/2021   COPD exacerbation ([place]) 12/05/2020   CKD (chronic kidney disease), stage III ([place]) 12/04/2020   Anemia of chronic disease 12/04/2020   Obesity, Class III, BMI 40-49.9 (morbid obesity) ([place]) 07/27/2020   Acute respiratory disease due to COVID-19 virus 07/25/2020   Acute hypoxemic respiratory failure due to COVID-19 ([place]) 07/25/2020   Morbid obesity ([place]) 11/23/2019   Acute respiratory failure with hypoxia ([place]) 11/06/2019   Chronic kidney  disease, stage 3b ([place]) 11/06/2019   COPD without exacerbation ([place])    Acute kidney failure ([place]) 08/18/2019   Left ventricular hypertrophy 07/21/2019   Educated about COVID-19 virus infection 85/63/1497   Acute diastolic HF (heart failure) (H[facility])    Hypoxia    OSA (obstructive sleep apnea) 05/09/2019   Hyperglycemia 04/09/2019   Snoring 03/13/2019   Chronic anemia 08/17/2018   Chronic cystitis 08/03/2018   Diabetic neuropathy ([place]) 06/16/2017   Non compliance w medication regimen 04/12/2017   Family history of colon cancer in mother 11/17/2016   Dyspareunia in female 11/11/2016   Screen for colon cancer 11/11/2016   History of ovarian cyst 11/11/2016   Chronic hepatitis C without hepatic coma ([place]) 11/09/2016   Hyperlipidemia 10/07/2016   Type 2 diabetes mellitus with hyperlipidemia ([place]) 01/29/2014   Status post intraocular lens implant 11/08/2013   PCO (posterior capsular opacification) 09/28/2013   Pseudophakia 09/12/2013   GERD (gastroesophageal reflux disease) 09/06/2013   Hypertension 09/06/2013   ANEMIA, IRON DEFICIENCY 10/03/2009   LIPOMA 06/18/2009   TINEA PEDIS 05/07/2009   SUBSTANCE ABUSE, MULTIPLE 02/22/2007   HCVD (hypertensive cardiovascular disease) 02/22/2007    Current Outpatient Medications:    amLODipine (NORVASC) 2.5 MG tablet, Take 1 tablet (2.5 mg total) by mouth daily., Disp: 90 tablet, Rfl: 1   atorvastatin (LIPITOR) 40 MG tablet, Take 1 tablet (40 mg total) by mouth every morning., Disp: 90 tablet, Rfl: 1   empagliflozin (JARDIANCE) 10 MG TABS tablet, Take 1  tablet (10 mg total) by mouth daily., Disp: 90 tablet, Rfl: 1   famotidine (PEPCID) 40 MG tablet, TAKE ONE TABLET BY MOUTH daily, Disp: 30 tablet, Rfl: 3   ferrous sulfate 325 (65 FE) MG tablet, Take 325 mg by mouth daily with breakfast., Disp: , Rfl:    hydrALAZINE (APRESOLINE) 50 MG tablet, TAKE ONE TABLET BY MOUTH THREE TIMES DAILY, Disp: 270 tablet, Rfl: 1   methimazole (TAPAZOLE) 5 MG tablet,  TAKE ONE TABLET BY MOUTH three times daily, Disp: 90 tablet, Rfl: 1   oxybutynin (DITROPAN) 5 MG tablet, Take 1 tablet (5 mg total) by mouth 2 (two) times daily., Disp: 60 tablet, Rfl: 6   OZEMPIC, 0.25 OR 0.5 MG/DOSE, 2 MG/3ML SOPN, INJECT 0.5 MG into THE SKIN ONCE A WEEK, Disp: 3 mL, Rfl: 1   pregabalin (LYRICA) 75 MG capsule, TAKE ONE CAPSULE BY MOUTH TWICE DAILY, Disp: 180 capsule, Rfl: 1   spironolactone (ALDACTONE) 25 MG tablet, Take 0.5 tablets (12.5 mg total) by mouth daily., Disp: 30 tablet, Rfl: 3   torsemide (DEMADEX) 20 MG tablet, Take 2 tablets (40 mg total) by mouth daily., Disp: 90 tablet, Rfl: 6   albuterol (VENTOLIN HFA) 108 (90 Base) MCG/ACT inhaler, Inhale 1 puff into the lungs every 4 (four) hours as needed for wheezing or shortness of breath. (Patient not taking: Reported on 04/27/2022), Disp: 18 g, Rfl: 0   carvedilol (COREG) 25 MG tablet, Take 1 tablet (25 mg total) by mouth 2 (two) times daily., Disp: 180 tablet, Rfl: 1   mometasone-formoterol (DULERA) 100-5 MCG/ACT AERO, Inhale 2 puffs into the lungs as needed for wheezing. (Patient not taking: Reported on 04/20/2022), Disp: , Rfl:    nystatin ointment (MYCOSTATIN), Apply 1 application. topically 2 (two) times daily as needed (rash/skin irritation). (Patient not taking: Reported on 04/20/2022), Disp: 30 g, Rfl: 1 Allergies  Allergen Reactions   Lisinopril Swelling      Social History   Socioeconomic History   Marital status: Legally Separated    Spouse name: Not on file   Number of children: 1   Years of education: Not on file   Highest education level: 9th grade  Occupational History   Occupation: disability  Tobacco Use   Smoking status: Never   Smokeless tobacco: Never  Vaping Use   Vaping Use: Never used  Substance and Sexual Activity   Alcohol use: No   Drug use: Not Currently    Types: Cocaine, Marijuana    Comment: remote h/o cocaine 2015 and marijuana 2018   Sexual activity: Not Currently  Other  Topics Concern   Not on file  Social History Narrative   Lives with friend, [name] [name] and nephew.  One daughter.    Social Determinants of Health   Financial Resource Strain: Low Risk  (12/29/2021)   Overall Financial Resource Strain (CARDIA)    Difficulty of Paying Living Expenses: Not very hard  Food Insecurity: No Food Insecurity (11/26/2021)   Hunger Vital Sign    Worried About Running Out of Food in the Last Year: Never true    Ran Out of Food in the Last Year: Never true  Transportation Needs: No Transportation Needs (04/27/2022)   PRAPARE - Hydrologist (Medical): No    Lack of Transportation (Non-Medical): No  Physical Activity: Insufficiently Active (02/04/2022)   Exercise Vital Sign    Days of Exercise per Week: 1 day    Minutes of Exercise per Session: 30 min  Stress: No Stress Concern Present (04/13/2022)   [place]    Feeling of Stress : Not at all  Social Connections: Moderately Integrated (12/29/2021)   Social Connection and Isolation Panel [NHANES]    Frequency of Communication with Friends and Family: More than three times a week    Frequency of Social Gatherings with Friends and Family: More than three times a week    Attends Religious Services: More than 4 times per year    Active Member of [employer] or Organizations: No    Attends Archivist Meetings: Never    Marital Status: Living with partner  Intimate Partner Violence: Not At Risk (02/04/2022)   Humiliation, Afraid, Rape, and Kick questionnaire    Fear of Current or Ex-Partner: No    Emotionally Abused: No    Physically Abused: No    Sexually Abused: No    Physical Exam      Future Appointments  Date Time Provider [place]  05/28/2022 10:30 AM Brita Romp, NP REA-REA None  05/31/2022 11:10 AM [name] Rakes, MD [facility] None  06/02/2022  9:20 AM [facility] DIAG [place]OM[place] 1 GI-BCGMM GI-BREAST  CE  06/02/2022  9:30 AM GI-BCG [place] 1 GI-BCGUS GI-BREAST CE  06/02/2022  9:35 AM GI-BCG [place] 1 GI-BCGUS GI-BREAST CE  06/16/2022 10:30 AM [facility] PA/NP [facility] None  06/18/2022  9:00 AM Craft, Lorel Monaco, RN [place], [place] [facility] Paramedic  05/25/22

## 2022-05-26 ENCOUNTER — Telehealth (HOSPITAL_COMMUNITY): Payer: Self-pay | Admitting: Licensed Clinical Social Worker

## 2022-05-26 NOTE — Telephone Encounter (Signed)
HF Paramedicine Team Based Care Meeting  HF MD- NA  HF NP - [place] NP-C   [facility] admit within the last 30 days for heart failure? no  Medications concerns? Working with pt to fill her own pill box- still having some concerns with this but is improving- hopeful she will be able to do independently soon  SDOH - no concerns  Eligible for discharge? Hopeful to DC in next month  Jorge Ny, [place] Worker [facility] Desk#: [phone] Cell#: [phone]

## 2022-05-27 NOTE — Patient Instructions (Signed)
Hyperthyroidism Hyperthyroidism refers to a thyroid gland that is too active or overactive. The thyroid gland is a small gland located in the lower front part of the neck, just in front of the windpipe (trachea). This gland makes hormones that: Help control how the body uses food for energy (metabolism). Help the heart and brain work well. Keep your bones strong. When the thyroid is overactive, it produces too much of a hormone called thyroxine. What are the causes? This condition may be caused by: Graves' disease. This is a disorder in which the body's disease-fighting system (immune system) attacks the thyroid gland. This is the most common cause. Inflammation of the thyroid gland. A tumor in the thyroid gland. Use of certain medicines, including: Prescription thyroid hormone replacement. Herbal supplements that mimic thyroid hormones. Amiodarone therapy. Solid or fluid-filled lumps within your thyroid gland (thyroid nodules). Taking in a large amount of iodine from foods or medicines. What increases the risk? You are more likely to develop this condition if: You are female. You have a family history of thyroid conditions. You smoke tobacco. You use a medicine called lithium. You take medicines that affect the immune system (immunosuppressants). What are the signs or symptoms? Symptoms of this condition include: Nervousness. Inability to tolerate heat. Diarrhea. Rapid heart rate. Shaky hands. Restlessness. Sleep problems. Other symptoms may include: Heart skipping beats or making extra beats. Unexplained weight loss. Change in the texture of hair or skin. Loss of menstruation. Fatigue. Enlarged thyroid gland or a lump in the thyroid (nodule). You may also have symptoms of Graves' disease, which may include: Protruding eyes. Dry eyes. Red or swollen eyes. Problems with vision. How is this diagnosed? This condition may be diagnosed based on: Your symptoms and medical  history. A physical exam. Blood tests. Thyroid ultrasound. This test involves using sound waves to produce images of the thyroid gland. A thyroid scan. A radioactive substance is injected into a vein, and images show how much iodine is present in the thyroid. Radioactive iodine uptake test (RAIU). A small amount of radioactive iodine is given by mouth to see how much iodine the thyroid absorbs after a certain amount of time. How is this treated? Treatment depends on the cause and severity of the condition. Treatment may include: Medicines to reduce the amount of thyroid hormone your body makes. Medicines to help manage your symptoms. Radioactive iodine treatment (radioiodine therapy). This involves swallowing a small dose of radioactive iodine, in capsule or liquid form, to kill thyroid cells. Surgery to remove part or all of your thyroid gland. You may need to take thyroid hormone replacement medicine for the rest of your life after thyroid surgery. Follow these instructions at home:  Take over-the-counter and prescription medicines only as told by your health care provider. Do not use any products that contain nicotine or tobacco. These products include cigarettes, chewing tobacco, and vaping devices, such as e-cigarettes. If you need help quitting, ask your health care provider. Follow any instructions from your health care provider about diet. You may be instructed to limit foods that contain iodine. Keep all follow-up visits. You will need to have blood tests regularly so that your health care provider can monitor your condition. Where to find more information National Institute of Diabetes and Digestive and Kidney Diseases: niddk.nih.gov Contact a health care provider if: Your symptoms do not get better with treatment. You have a fever. You have abdominal pain. You feel dizzy. You are taking thyroid hormone replacement medicine and: You have   symptoms of depression. You feel like you  are tired all the time. You gain weight. Get help right away if: You have sudden, unexplained confusion or other mental changes. You have chest pain. You have fast or irregular heartbeats (palpitations). You have difficulty breathing. These symptoms may be an emergency. Get help right away. Call 911. Do not wait to see if the symptoms will go away. Do not drive yourself to the hospital. Summary The thyroid gland is a small gland located in the lower front part of the neck, just in front of the windpipe. Hyperthyroidism is when the thyroid gland is too active and produces too much of a hormone called thyroxine. The most common cause is Graves' disease, a disorder in which your immune system attacks the thyroid gland. Hyperthyroidism can cause various symptoms, such as unexplained weight loss, nervousness, inability to tolerate heat, or changes in your heartbeat. Treatment may include medicine to reduce the amount of thyroid hormone your body makes, radioiodine therapy, surgery, or medicines to manage symptoms. This information is not intended to replace advice given to you by your health care provider. Make sure you discuss any questions you have with your health care provider. Document Revised: 08/28/2021 Document Reviewed: 08/28/2021 Elsevier Patient Education  2023 [employer]  

## 2022-05-28 ENCOUNTER — Ambulatory Visit (INDEPENDENT_AMBULATORY_CARE_PROVIDER_SITE_OTHER): Payer: Medicare Other | Admitting: Nurse Practitioner

## 2022-05-28 ENCOUNTER — Encounter: Payer: Self-pay | Admitting: Nurse Practitioner

## 2022-05-28 VITALS — BP 132/79 | HR 69 | Ht 64.0 in | Wt 195.8 lb

## 2022-05-28 DIAGNOSIS — E0590 Thyrotoxicosis, unspecified without thyrotoxic crisis or storm: Secondary | ICD-10-CM

## 2022-05-28 NOTE — Progress Notes (Signed)
05/28/2022     Endocrinology Consult Note    Subjective:    Patient ID: [name] [name], female    DOB: 09-Oct-1960, PCP [name], MD.   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   Qualifier: Diagnosis of  By: Jorene Minors, [place]     Hypertension    MRSA (methicillin resistant Staphylococcus aureus)    Trichomonas    VAGINITIS, BACTERIAL, RECURRENT 08/15/2007   Qualifier: Diagnosis of  By: Radene Ou MD, [place]      Past Surgical History:  Procedure Laterality Date   BREAST BIOPSY Left 2018   CESAREAN SECTION     breech    Social History   Socioeconomic History   Marital status: Legally Separated    Spouse name: Not on file   Number of children: 1   Years of education: Not on file   Highest education level: 9th grade  Occupational History   Occupation: disability  Tobacco Use   Smoking status: Never   Smokeless tobacco: Never  Vaping Use   Vaping Use: Never used  Substance and Sexual Activity   Alcohol use: No   Drug use: Not Currently    Types: Cocaine, Marijuana    Comment: remote h/o cocaine 2015 and marijuana 2018   Sexual activity: Not Currently  Other Topics Concern   Not on file  Social History Narrative   Lives with friend, Rosalee Kaufman and nephew.  One daughter.    Social Determinants of Health   Financial Resource Strain: Low Risk  (12/29/2021)   Overall Financial Resource Strain (CARDIA)    Difficulty of Paying Living Expenses: Not very hard  Food Insecurity: No Food Insecurity (11/26/2021)   Hunger Vital Sign    Worried About Running Out of Food in the Last Year: Never true    Ran Out of Food in the Last Year: Never true  Transportation Needs: No Transportation Needs (04/27/2022)   PRAPARE - Hydrologist (Medical): No    Lack of Transportation  (Non-Medical): No  Physical Activity: Insufficiently Active (02/04/2022)   Exercise Vital Sign    Days of Exercise per Week: 1 day    Minutes of Exercise per Session: 30 min  Stress: No Stress Concern Present (04/13/2022)   [place]    Feeling of Stress : Not at all  Social Connections: Moderately Integrated (12/29/2021)   Social Connection and Isolation Panel [NHANES]    Frequency of Communication with Friends and Family: More than three times a week    Frequency of Social Gatherings with Friends and Family: More than three times a week    Attends Religious Services: More than 4 times per year    Active Member of [employer] or Organizations: No    Attends Archivist Meetings: Never    Marital Status: Living with partner    Family History  Problem Relation Age of Onset   Colon cancer Mother    Liver disease Sister    Other Neg Hx    Breast cancer Neg Hx    Esophageal cancer Neg Hx    Rectal cancer Neg Hx    Tremor Neg Hx     Outpatient Encounter Medications as of 05/28/2022  Medication Sig  albuterol (VENTOLIN HFA) 108 (90 Base) MCG/ACT inhaler Inhale 1 puff into the lungs every 4 (four) hours as needed for wheezing or shortness of breath.   amLODipine (NORVASC) 2.5 MG tablet Take 1 tablet (2.5 mg total) by mouth daily.   atorvastatin (LIPITOR) 40 MG tablet Take 1 tablet (40 mg total) by mouth every morning.   carvedilol (COREG) 25 MG tablet Take 1 tablet (25 mg total) by mouth 2 (two) times daily.   empagliflozin (JARDIANCE) 10 MG TABS tablet Take 1 tablet (10 mg total) by mouth daily.   famotidine (PEPCID) 40 MG tablet TAKE ONE TABLET BY MOUTH daily   ferrous sulfate 325 (65 FE) MG tablet Take 325 mg by mouth daily with breakfast.   hydrALAZINE (APRESOLINE) 50 MG tablet TAKE ONE TABLET BY MOUTH THREE TIMES DAILY   methimazole (TAPAZOLE) 5 MG tablet TAKE ONE TABLET BY MOUTH three times daily   oxybutynin  (DITROPAN) 5 MG tablet Take 1 tablet (5 mg total) by mouth 2 (two) times daily.   OZEMPIC, 0.25 OR 0.5 MG/DOSE, 2 MG/3ML SOPN INJECT 0.5 MG into THE SKIN ONCE A WEEK   pregabalin (LYRICA) 75 MG capsule TAKE ONE CAPSULE BY MOUTH TWICE DAILY   spironolactone (ALDACTONE) 25 MG tablet Take 0.5 tablets (12.5 mg total) by mouth daily.   torsemide (DEMADEX) 20 MG tablet Take 2 tablets (40 mg total) by mouth daily.   mometasone-formoterol (DULERA) 100-5 MCG/ACT AERO Inhale 2 puffs into the lungs as needed for wheezing. (Patient not taking: Reported on 04/20/2022)   nystatin ointment (MYCOSTATIN) Apply 1 application. topically 2 (two) times daily as needed (rash/skin irritation). (Patient not taking: Reported on 04/20/2022)   No facility-administered encounter medications on file as of 05/28/2022.    ALLERGIES: Allergies  Allergen Reactions   Lisinopril Swelling    VACCINATION STATUS: Immunization History  Administered Date(s) Administered   Fluad Quad(high Dose 65+) 04/12/2019   Influenza Whole 04/18/2006, 06/13/2007, 06/29/2010   Influenza,inj,Quad PF,6+ Mos 05/13/2014, 06/03/2020, 04/01/2021   PNEUMOCOCCAL CONJUGATE-20 04/01/2021   Pneumococcal Polysaccharide-23 01/22/2005, 11/18/2016   Td 01/22/2005     HPI  QUISHA MABIE is 61 y.o. female who presents today with a medical history as above. [name] [name] is being seen in consultation for hyperthyroidism requested by [name], MD.  [name] [name] has been dealing with symptoms of diarrhea, muscle twitching, weight loss, left eye watering, and intermittent heart palpitations for 4 months. These symptoms are progressively worsening and troubling to [name] [name].  [name] [name] most recent thyroid labs revealed suppressed TSH of < 0.005 and high Free T4 of 2.81 and high Free T3 of 6.4 on 02/24/22.  [name] [name] previous labs were also high on 02/10/22.  [name] [name] PCP started [name] [name] on Methimazole 5 mg po TID at that time.  [name] [name] has never had any imaging of [name] [name] thyroid in the past.  [name] [name] has not  been on Amiodarone or Lithium in the past (per chart review).  [name] [name] denies dysphagia, choking, shortness of breath, no recent voice change.    [name] [name] denies known family history of thyroid dysfunction and denies family hx of thyroid cancer. [name] [name] denies personal history of goiter. [name] [name] is not on any anti-thyroid medications nor on any thyroid hormone supplements. Denies use of Biotin containing supplements.  [name] [name] is willing to proceed with appropriate work up and therapy for thyrotoxicosis.   Review of systems  Constitutional: + decreasing body weight, current Body mass index is 33.61 kg/m., no fatigue, no subjective hyperthermia, no subjective hypothermia Eyes: no blurry  vision, left eye waters constantly and twitches, no xerophthalmia ENT: no sore throat, no nodules palpated in throat, no dysphagia/odynophagia, no hoarseness Cardiovascular: no chest pain, no shortness of breath, + intermittent palpitations, no leg swelling Respiratory: no cough, no shortness of breath Gastrointestinal: no nausea/vomiting, + diarrhea-improved on Methimazole Musculoskeletal: no muscle/joint aches, + muscle twitching Skin: no rashes, no hyperemia Neurological: no tremors, no numbness, no tingling, no dizziness Psychiatric: no depression, no anxiety   Objective:    BP 132/79 (BP Location: Right Arm, Patient Position: Sitting, Cuff Size: Large)   Pulse 69   Ht '5\' 4"'$  (1.626 m)   Wt 195 lb 12.8 oz (88.8 kg)   LMP 04/01/2010   BMI 33.61 kg/m   Wt Readings from Last 3 Encounters:  05/28/22 195 lb 12.8 oz (88.8 kg)  05/25/22 195 lb 3.2 oz (88.5 kg)  05/18/22 193 lb (87.5 kg)     BP Readings from Last 3 Encounters:  05/28/22 132/79  05/25/22 130/80  05/18/22 (!) 140/88                        Physical Exam- Limited  Constitutional:  Body mass index is 33.61 kg/m. , not in acute distress, normal state of mind Eyes:  EOMI, no exophthalmos, left eye watering, noticeable twitching Neck: Supple Thyroid: No  gross goiter Cardiovascular: RRR, no murmurs, rubs, or gallops, no edema Respiratory: Adequate breathing efforts, no crackles, rales, rhonchi, or wheezing Musculoskeletal: no gross deformities, strength intact in all four extremities, no gross restriction of joint movements Skin:  no rashes, no hyperemia Neurological: no tremor with outstretched hands, DTR normal in BLE   CMP     Component Value Date/Time   NA 141 03/29/2022 0939   NA 142 12/21/2021 1419   K 4.1 03/29/2022 0939   CL 111 03/29/2022 0939   CO2 26 03/29/2022 0939   GLUCOSE 161 (H) 03/29/2022 0939   BUN 40 (H) 03/29/2022 0939   BUN 71 (H) 12/21/2021 1419   CREATININE 1.91 (H) 03/29/2022 0939   CREATININE 0.88 12/12/2012 1349   CALCIUM 9.4 03/29/2022 0939   PROT 6.7 10/01/2021 1435   ALBUMIN 4.5 10/01/2021 1435   AST 8 10/01/2021 1435   ALT 5 10/01/2021 1435   ALT 15 11/18/2016 1133   ALKPHOS 78 10/01/2021 1435   BILITOT 0.4 10/01/2021 1435   GFRNONAA 29 (L) 03/29/2022 0939   GFRAA 38 (L) 11/08/2019 0435     CBC    Component Value Date/Time   WBC 3.6 (L) 09/25/2021 0315   RBC 3.11 (L) 09/25/2021 0315   HGB 8.0 (L) 09/25/2021 0315   HGB 9.8 (L) 07/10/2019 1544   HCT 26.3 (L) 09/25/2021 0315   HCT 30.9 (L) 07/10/2019 1544   PLT 168 09/25/2021 0315   PLT 171 07/10/2019 1544   MCV 84.6 09/25/2021 0315   MCV 86 07/10/2019 1544   MCH 25.7 (L) 09/25/2021 0315   MCHC 30.4 09/25/2021 0315   RDW 13.8 09/25/2021 0315   RDW 12.7 07/10/2019 1544   LYMPHSABS 1.0 09/22/2021 1209   LYMPHSABS 1.3 07/10/2019 1544   MONOABS 0.4 09/22/2021 1209   EOSABS 0.0 09/22/2021 1209   EOSABS 0.0 07/10/2019 1544   BASOSABS 0.0 09/22/2021 1209   BASOSABS 0.0 07/10/2019 1544     Diabetic Labs (most recent): Lab Results  Component Value Date   HGBA1C 7.4 (A) 02/10/2022   HGBA1C 5.2 09/23/2021   HGBA1C 8.6 (H) 07/03/2021   MICROALBUR 4.17 (  H) 06/30/2010   MICROALBUR 3.25 (H) 06/18/2009    Lipid Panel     Component  Value Date/Time   CHOL 113 02/10/2022 1043   TRIG 81 02/10/2022 1043   HDL 52 02/10/2022 1043   CHOLHDL 2.5 01/29/2019 1118   CHOLHDL 3 06/18/2014 0951   VLDL 19.6 06/18/2014 0951   LDLCALC 45 02/10/2022 1043   LABVLDL 16 02/10/2022 1043        Latest Reference Range & Units 08/18/19 05:01 02/10/22 10:43 02/24/22 11:11  TSH 0.450 - 4.500 uIU/mL 1.547 <0.005 (L) <0.005 (L)  Triiodothyronine,Free,Serum 2.0 - 4.4 pg/mL  7.5 (H) 6.4 (H)  T4,Free(Direct) 0.82 - 1.77 ng/dL  3.14 (H) 2.81 (H)  (L): Data is abnormally low (H): Data is abnormally high     Assessment & Plan:   1. Hyperthyroidism  [name] [name] is being seen at a kind request of [name], MD.  [name] [name] history and most recent labs are reviewed, and [name] [name] was examined clinically. Subjective and objective findings are consistent with thyrotoxicosis likely from primary hyperthyroidism. The potential risks of untreated thyrotoxicosis and the need for definitive therapy have been discussed in detail with [name] [name], and [name] [name] agrees to proceed with diagnostic workup and treatment plan.   I will repeat full profile thyroid function tests today (including antibody testing to assess for autoimmune thyroid dysfunction).  [name] [name] is advised to continue [name] [name] Methimazole 5 mg po TID for now.  Once I get the lab results, I will call [name] [name] and give [name] [name] further instructions.  [name] [name] will have to be off the Methimazole for a full 5 days prior to having the uptake and scan.  Confirmatory thyroid uptake and scan will be scheduled to be done as soon as possible.   Options of therapy are discussed with [name] [name].  We discussed the option of treating it with medications including methimazole or PTU which may have side effects including rash, transaminitis, and bone marrow suppression.  We also discussed the option of definitive therapy with RAI ablation of the thyroid. If [name] [name] is found to have primary hyperthyroidism from Graves' disease , toxic multinodular goiter or toxic nodular  goiter the preferred modality of treatment would be I-131 thyroid ablation. Surgery is another choice of treatment in some cases, in [name] [name] case surgery is not a good fit for presentation with only mild goiter.  -Patient is made aware of the high likelihood of post ablative hypothyroidism with subsequent need for lifelong thyroid hormone replacement. sheunderstands this outcome and [name] [name] is  willing to proceed.      [name] [name] will return in 1 weeks for treatment decision.  [name] [name] is already on beta-blocker for CHF under cardiology care.    -Patient is advised to maintain close follow up with [name], MD for primary care needs.   - Time spent with the patient: 45 minutes, of which >50% was spent in obtaining information about [name] [name] symptoms, reviewing [name] [name] previous labs, evaluations, and treatments, counseling [name] [name] about [name] [name] hyperthyroidism , and developing a plan to confirm the diagnosis and long term treatment as necessary. Please refer to "Patient Self Inventory" in the Media tab for reviewed elements of pertinent patient history.  [name] participated in the discussions, expressed understanding, and voiced agreement with the above plans.  All questions were answered to [name] [name] satisfaction. [name] [name] is encouraged to contact clinic should [name] [name] have any questions or concerns prior to [name] [name] return visit.   Follow up plan: Return in about 4 days (around 06/01/2022) for Thyroid follow  up, Virtual visit ok, Previsit labs.   Thank you for involving me in the care of this pleasant patient, and I will continue to update you with [name] [name] progress.    Rayetta Pigg, [facility] [address] [place], [place] [address] Phone: [phone] Fax: [phone]  05/28/2022, 10:51 AM

## 2022-05-29 LAB — THYROID PEROXIDASE ANTIBODY: Thyroperoxidase Ab SerPl-aCnc: 15 IU/mL (ref 0–34)

## 2022-05-29 LAB — T3, FREE: T3, Free: 2.5 pg/mL (ref 2.0–4.4)

## 2022-05-29 LAB — THYROGLOBULIN ANTIBODY: Thyroglobulin Antibody: <1 IU/mL (ref 0.0–0.9)

## 2022-05-29 LAB — T4, FREE: Free T4: 1.12 ng/dL (ref 0.82–1.77)

## 2022-05-29 LAB — TSH: TSH: 0.006 u[IU]/mL — ABNORMAL LOW (ref 0.450–4.500)

## 2022-05-31 ENCOUNTER — Encounter: Payer: Self-pay | Admitting: Family Medicine

## 2022-05-31 ENCOUNTER — Ambulatory Visit (INDEPENDENT_AMBULATORY_CARE_PROVIDER_SITE_OTHER): Payer: Medicare Other | Admitting: Nurse Practitioner

## 2022-05-31 ENCOUNTER — Ambulatory Visit: Payer: Medicare Other | Attending: Family Medicine | Admitting: Family Medicine

## 2022-05-31 ENCOUNTER — Encounter: Payer: Self-pay | Admitting: Nurse Practitioner

## 2022-05-31 VITALS — Ht 64.0 in | Wt 185.0 lb

## 2022-05-31 VITALS — BP 141/85 | HR 65 | Wt 196.0 lb

## 2022-05-31 DIAGNOSIS — E1122 Type 2 diabetes mellitus with diabetic chronic kidney disease: Secondary | ICD-10-CM

## 2022-05-31 DIAGNOSIS — I110 Hypertensive heart disease with heart failure: Secondary | ICD-10-CM

## 2022-05-31 DIAGNOSIS — E0590 Thyrotoxicosis, unspecified without thyrotoxic crisis or storm: Secondary | ICD-10-CM

## 2022-05-31 DIAGNOSIS — K648 Other hemorrhoids: Secondary | ICD-10-CM

## 2022-05-31 DIAGNOSIS — E1149 Type 2 diabetes mellitus with other diabetic neurological complication: Secondary | ICD-10-CM | POA: Diagnosis not present

## 2022-05-31 DIAGNOSIS — N3946 Mixed incontinence: Secondary | ICD-10-CM

## 2022-05-31 DIAGNOSIS — Z794 Long term (current) use of insulin: Secondary | ICD-10-CM

## 2022-05-31 DIAGNOSIS — I129 Hypertensive chronic kidney disease with stage 1 through stage 4 chronic kidney disease, or unspecified chronic kidney disease: Secondary | ICD-10-CM | POA: Diagnosis not present

## 2022-05-31 DIAGNOSIS — I5032 Chronic diastolic (congestive) heart failure: Secondary | ICD-10-CM | POA: Diagnosis not present

## 2022-05-31 LAB — GLUCOSE, POCT (MANUAL RESULT ENTRY): POC Glucose: 159 mg/dl — AB (ref 70–99)

## 2022-05-31 LAB — POCT GLYCOSYLATED HEMOGLOBIN (HGB A1C): HbA1c, POC (controlled diabetic range): 7.5 % — AB (ref 0.0–7.0)

## 2022-05-31 MED ORDER — HYDROCORT-PRAMOXINE (PERIANAL) 2.5-1 % EX CREA: 1.0000 | TOPICAL_CREAM | Freq: Three times a day (TID) | CUTANEOUS | 1 refills | Status: DC

## 2022-05-31 MED ORDER — ATORVASTATIN CALCIUM 40 MG PO TABS: 40.0000 mg | ORAL_TABLET | Freq: Every morning | ORAL | 1 refills | Status: DC

## 2022-05-31 MED ORDER — METHIMAZOLE 5 MG PO TABS: 5.0000 mg | ORAL_TABLET | Freq: Every day | ORAL | 1 refills | Status: DC

## 2022-05-31 MED ORDER — EMPAGLIFLOZIN 10 MG PO TABS: 10.0000 mg | ORAL_TABLET | Freq: Every day | ORAL | 1 refills | Status: DC

## 2022-05-31 MED ORDER — SEMAGLUTIDE (1 MG/DOSE) 4 MG/3ML ~~LOC~~ SOPN: 1.0000 mg | PEN_INJECTOR | SUBCUTANEOUS | 6 refills | Status: DC

## 2022-05-31 MED ORDER — AMLODIPINE BESYLATE 2.5 MG PO TABS: 2.5000 mg | ORAL_TABLET | Freq: Every day | ORAL | 1 refills | Status: DC

## 2022-05-31 MED ORDER — PREGABALIN 75 MG PO CAPS: 75.0000 mg | ORAL_CAPSULE | Freq: Two times a day (BID) | ORAL | 1 refills | Status: DC

## 2022-05-31 MED ORDER — OXYBUTYNIN CHLORIDE 5 MG PO TABS: 5.0000 mg | ORAL_TABLET | Freq: Two times a day (BID) | ORAL | 6 refills | Status: DC

## 2022-05-31 NOTE — Patient Instructions (Signed)

## 2022-05-31 NOTE — Progress Notes (Signed)
05/31/2022     Endocrinology Follow Up Note    TELEHEALTH VISIT: The patient is being engaged in telehealth visit.  This type of visit limits physical examination significantly, and thus is not preferable over face-to-face encounters.  I connected with  LATIYA NAVIA on 05/31/22 by a video enabled telemedicine application and verified that I am speaking with the correct person using two identifiers.   I discussed the limitations of evaluation and management by telemedicine. The patient expressed understanding and agreed to proceed.    The participants involved in this visit include: Brita Romp, NP located at [facility] and DARYAN CAGLEY and her daughter located at their personal residence listed.    Subjective:    Patient ID: [name] [name], female    DOB: August 24, 1960, PCP [name], MD.   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   Qualifier: Diagnosis of  By: Jorene Minors, [place]     Hypertension    MRSA (methicillin resistant Staphylococcus aureus)    Trichomonas    VAGINITIS, BACTERIAL, RECURRENT 08/15/2007   Qualifier: Diagnosis of  By: Radene Ou MD, [place]      Past Surgical History:  Procedure Laterality Date   BREAST BIOPSY Left 2018   CESAREAN SECTION     breech    Social History   Socioeconomic History   Marital status: Legally Separated    Spouse name: Not on file   Number of children: 1   Years of education: Not on file   Highest education level: 9th grade  Occupational History   Occupation: disability  Tobacco Use   Smoking status: Never   Smokeless tobacco: Never  Vaping Use   Vaping Use: Never used  Substance and Sexual Activity   Alcohol use: No   Drug use: Not Currently    Types: Cocaine, Marijuana    Comment: remote h/o cocaine 2015  and marijuana 2018   Sexual activity: Not Currently  Other Topics Concern   Not on file  Social History Narrative   Lives with friend, Rosalee Kaufman and nephew.  One daughter.    Social Determinants of Health   Financial Resource Strain: Low Risk  (12/29/2021)   Overall Financial Resource Strain (CARDIA)    Difficulty of Paying Living Expenses: Not very hard  Food Insecurity: No Food Insecurity (11/26/2021)   Hunger Vital Sign    Worried About Running Out of Food in the Last Year: Never true    Ran Out of Food in the Last Year: Never true  Transportation Needs: No Transportation Needs (04/27/2022)   PRAPARE - Hydrologist (Medical): No    Lack of Transportation (Non-Medical): No  Physical Activity: Insufficiently Active (02/04/2022)   Exercise Vital Sign    Days of Exercise per Week: 1 day    Minutes of Exercise per Session: 30 min  Stress: No Stress Concern Present (04/13/2022)   [place]    Feeling of Stress : Not at all  Social Connections: Moderately Integrated (12/29/2021)   Social Connection and Isolation Panel [NHANES]    Frequency of Communication with Friends and Family: More than three times a week    Frequency of Social Gatherings with Friends and Family: More than  three times a week    Attends Religious Services: More than 4 times per year    Active Member of Clubs or Organizations: No    Attends Archivist Meetings: Never    Marital Status: Living with partner    Family History  Problem Relation Age of Onset   Colon cancer Mother    Liver disease Sister    Other Neg Hx    Breast cancer Neg Hx    Esophageal cancer Neg Hx    Rectal cancer Neg Hx    Tremor Neg Hx     Outpatient Encounter Medications as of 05/31/2022  Medication Sig   albuterol (VENTOLIN HFA) 108 (90 Base) MCG/ACT inhaler Inhale 1 puff into the lungs every 4 (four) hours as needed for wheezing or  shortness of breath.   amLODipine (NORVASC) 2.5 MG tablet Take 1 tablet (2.5 mg total) by mouth daily.   atorvastatin (LIPITOR) 40 MG tablet Take 1 tablet (40 mg total) by mouth every morning.   empagliflozin (JARDIANCE) 10 MG TABS tablet Take 1 tablet (10 mg total) by mouth daily.   famotidine (PEPCID) 40 MG tablet TAKE ONE TABLET BY MOUTH daily   ferrous sulfate 325 (65 FE) MG tablet Take 325 mg by mouth daily with breakfast.   hydrALAZINE (APRESOLINE) 50 MG tablet TAKE ONE TABLET BY MOUTH THREE TIMES DAILY   oxybutynin (DITROPAN) 5 MG tablet Take 1 tablet (5 mg total) by mouth 2 (two) times daily.   OZEMPIC, 0.25 OR 0.5 MG/DOSE, 2 MG/3ML SOPN INJECT 0.5 MG into THE SKIN ONCE A WEEK   pregabalin (LYRICA) 75 MG capsule TAKE ONE CAPSULE BY MOUTH TWICE DAILY   spironolactone (ALDACTONE) 25 MG tablet Take 0.5 tablets (12.5 mg total) by mouth daily.   torsemide (DEMADEX) 20 MG tablet Take 2 tablets (40 mg total) by mouth daily.   [DISCONTINUED] methimazole (TAPAZOLE) 5 MG tablet TAKE ONE TABLET BY MOUTH three times daily   carvedilol (COREG) 25 MG tablet Take 1 tablet (25 mg total) by mouth 2 (two) times daily.   methimazole (TAPAZOLE) 5 MG tablet Take 1 tablet (5 mg total) by mouth daily.   mometasone-formoterol (DULERA) 100-5 MCG/ACT AERO Inhale 2 puffs into the lungs as needed for wheezing. (Patient not taking: Reported on 04/20/2022)   nystatin ointment (MYCOSTATIN) Apply 1 application. topically 2 (two) times daily as needed (rash/skin irritation). (Patient not taking: Reported on 04/20/2022)   No facility-administered encounter medications on file as of 05/31/2022.    ALLERGIES: Allergies  Allergen Reactions   Lisinopril Swelling    VACCINATION STATUS: Immunization History  Administered Date(s) Administered   Fluad Quad(high Dose 65+) 04/12/2019   Influenza Whole 04/18/2006, 06/13/2007, 06/29/2010   Influenza,inj,Quad PF,6+ Mos 05/13/2014, 06/03/2020, 04/01/2021   PNEUMOCOCCAL  CONJUGATE-20 04/01/2021   Pneumococcal Polysaccharide-23 01/22/2005, 11/18/2016   Td 01/22/2005     HPI  [name] [name] is 61 y.o. female who presents today with a medical history as above. she is being seen in follow up after being seen in consultation for hyperthyroidism requested by [name], MD.  she has been dealing with symptoms of diarrhea, muscle twitching, weight loss, left eye watering, and intermittent heart palpitations for 4 months. These symptoms are progressively worsening and troubling to her.  her most recent thyroid labs revealed suppressed TSH of < 0.005 and high Free T4 of 2.81 and high Free T3 of 6.4 on 02/24/22.  Her previous labs were also high on 02/10/22.  Her PCP started  her on Methimazole 5 mg po TID at that time.  She has never had any imaging of her thyroid in the past.  She has not been on Amiodarone or Lithium in the past (per chart review).  she denies dysphagia, choking, shortness of breath, no recent voice change.    she denies known family history of thyroid dysfunction and denies family hx of thyroid cancer. she denies personal history of goiter. she is not on any anti-thyroid medications nor on any thyroid hormone supplements. Denies use of Biotin containing supplements.  she is willing to proceed with appropriate work up and therapy for thyrotoxicosis.   Review of systems  Constitutional: + decreasing body weight, current Body mass index is 31.76 kg/m., no fatigue, no subjective hyperthermia, no subjective hypothermia Eyes: no blurry vision, left eye waters constantly and twitches, no xerophthalmia ENT: no sore throat, no nodules palpated in throat, no dysphagia/odynophagia, no hoarseness Cardiovascular: no chest pain, no shortness of breath, + intermittent palpitations, no leg swelling Respiratory: no cough, no shortness of breath Gastrointestinal: no nausea/vomiting, + diarrhea-improved on Methimazole Musculoskeletal: no muscle/joint aches, +  muscle twitching Skin: no rashes, no hyperemia Neurological: no tremors, no numbness, no tingling, no dizziness Psychiatric: no depression, no anxiety   Objective:    Ht '5\' 4"'$  (1.626 m)   Wt 185 lb (83.9 kg) Comment: Per Patient  LMP 04/01/2010   BMI 31.76 kg/m   Wt Readings from Last 3 Encounters:  05/31/22 185 lb (83.9 kg)  05/28/22 195 lb 12.8 oz (88.8 kg)  05/25/22 195 lb 3.2 oz (88.5 kg)     BP Readings from Last 3 Encounters:  05/28/22 132/79  05/25/22 130/80  05/18/22 (!) 140/88                       Physical Exam- Telehealth- significantly limited due to nature of visit  Constitutional: Body mass index is 31.76 kg/m. , not in acute distress, normal state of mind Respiratory: Adequate breathing efforts   CMP     Component Value Date/Time   NA 141 03/29/2022 0939   NA 142 12/21/2021 1419   K 4.1 03/29/2022 0939   CL 111 03/29/2022 0939   CO2 26 03/29/2022 0939   GLUCOSE 161 (H) 03/29/2022 0939   BUN 40 (H) 03/29/2022 0939   BUN 71 (H) 12/21/2021 1419   CREATININE 1.91 (H) 03/29/2022 0939   CREATININE 0.88 12/12/2012 1349   CALCIUM 9.4 03/29/2022 0939   PROT 6.7 10/01/2021 1435   ALBUMIN 4.5 10/01/2021 1435   AST 8 10/01/2021 1435   ALT 5 10/01/2021 1435   ALT 15 11/18/2016 1133   ALKPHOS 78 10/01/2021 1435   BILITOT 0.4 10/01/2021 1435   GFRNONAA 29 (L) 03/29/2022 0939   GFRAA 38 (L) 11/08/2019 0435     CBC    Component Value Date/Time   WBC 3.6 (L) 09/25/2021 0315   RBC 3.11 (L) 09/25/2021 0315   HGB 8.0 (L) 09/25/2021 0315   HGB 9.8 (L) 07/10/2019 1544   HCT 26.3 (L) 09/25/2021 0315   HCT 30.9 (L) 07/10/2019 1544   PLT 168 09/25/2021 0315   PLT 171 07/10/2019 1544   MCV 84.6 09/25/2021 0315   MCV 86 07/10/2019 1544   MCH 25.7 (L) 09/25/2021 0315   MCHC 30.4 09/25/2021 0315   RDW 13.8 09/25/2021 0315   RDW 12.7 07/10/2019 1544   LYMPHSABS 1.0 09/22/2021 1209   LYMPHSABS 1.3 07/10/2019 1544   MONOABS 0.4 09/22/2021  1209   EOSABS 0.0  09/22/2021 1209   EOSABS 0.0 07/10/2019 1544   BASOSABS 0.0 09/22/2021 1209   BASOSABS 0.0 07/10/2019 1544     Diabetic Labs (most recent): Lab Results  Component Value Date   HGBA1C 7.4 (A) 02/10/2022   HGBA1C 5.2 09/23/2021   HGBA1C 8.6 (H) 07/03/2021   MICROALBUR 4.17 (H) 06/30/2010   MICROALBUR 3.25 (H) 06/18/2009    Lipid Panel     Component Value Date/Time   CHOL 113 02/10/2022 1043   TRIG 81 02/10/2022 1043   HDL 52 02/10/2022 1043   CHOLHDL 2.5 01/29/2019 1118   CHOLHDL 3 06/18/2014 0951   VLDL 19.6 06/18/2014 0951   LDLCALC 45 02/10/2022 1043   LABVLDL 16 02/10/2022 1043     Latest Reference Range & Units 08/18/19 05:01 02/10/22 10:43 02/24/22 11:11 05/28/22 11:10  TSH 0.450 - 4.500 uIU/mL 1.547 <0.005 (L) <0.005 (L) 0.006 (L)  Triiodothyronine,Free,Serum 2.0 - 4.4 pg/mL  7.5 (H) 6.4 (H) 2.5  T4,Free(Direct) 0.82 - 1.77 ng/dL  3.14 (H) 2.81 (H) 1.12  Thyroperoxidase Ab SerPl-aCnc 0 - 34 IU/mL    15  Thyroglobulin Antibody 0.0 - 0.9 IU/mL    <1.0  (L): Data is abnormally low (H): Data is abnormally high    Assessment & Plan:   1. Hyperthyroidism  she is being seen at a kind request of [name], MD.  her history and most recent labs are reviewed, and she was examined clinically. Subjective and objective findings are consistent with thyrotoxicosis likely from primary hyperthyroidism. The potential risks of untreated thyrotoxicosis and the need for definitive therapy have been discussed in detail with her, and she agrees to proceed with diagnostic workup and treatment plan.  Her antibody testing was negative, ruling out autoimmune thyroid dysfunction.  Her repeat labs show improvement with the Methimazole treatment, so much so that we can begin to taper her off and proceed with uptake and scan.  She is advised to lower her Methimazole to 5 mg po daily for 1 week then stop altogether.  She must be off Methimazole for 5 days prior to having the uptake and  scan.   Options of therapy are discussed with her.  We discussed the option of treating it with medications including methimazole or PTU which may have side effects including rash, transaminitis, and bone marrow suppression.  We also discussed the option of definitive therapy with RAI ablation of the thyroid. If she is found to have primary hyperthyroidism from Graves' disease , toxic multinodular goiter or toxic nodular goiter the preferred modality of treatment would be I-131 thyroid ablation. Surgery is another choice of treatment in some cases, in her case surgery is not a good fit for presentation with only mild goiter.  -Patient is made aware of the high likelihood of post ablative hypothyroidism with subsequent need for lifelong thyroid hormone replacement. sheunderstands this outcome and she is  willing to proceed.      she will return in 4 weeks for treatment decision.  She is already on beta-blocker for CHF under cardiology care.    -Patient is advised to maintain close follow up with [name], MD for primary care needs.   I spent 30 minutes dedicated to the care of this patient on the date of this encounter to include pre-visit review of records, face-to-face time with the patient, and post visit ordering of testing.  [name] [name] participated in the discussions, expressed understanding, and voiced agreement with the above  plans.  All questions were answered to her satisfaction. she is encouraged to contact clinic should she have any questions or concerns prior to her return visit.   Follow up plan: Return in about 4 weeks (around 06/28/2022) for Thyroid follow up, Virtual visit ok, Previsit labs uptake and scan.   Thank you for involving me in the care of this pleasant patient, and I will continue to update you with her progress.   Rayetta Pigg, [facility] [address] [place], [place] [address] Phone: [phone] Fax:  [phone]  05/31/2022, 9:31 AM

## 2022-05-31 NOTE — Progress Notes (Signed)
Subjective:  Patient ID: [name] [name], female    DOB: [birth year]  Age: 61 y.o. MRN: [id number]  CC: Facial Swelling   HPI JULIANNAH OHMANN is a 61 y.o. year old female with a history of type 2 diabetes mellitus (A1c 7.5), diabetic neuropathy, hypertension, diabetic retinopathy, hyperlipidemia, hepatitis C (completed treatment with harvoni), OSA, diastolic CHF (EF 60 to 07% from echo of 09/2021), stage III CKD, hyperthyroidism.  Interval History:  She was seen by endocrinology today and is currently being tapered off methimazole treatment so she can undergo radioactive iodine uptake scan.  She had a visit with cardiology, Dr. Aundra Dubin 3 months ago with plans for cardiac PET to assess for ischemia. BP is elevated and she is yet to take her antihypertensive this morning.  She felt a bump in her rectum last night which is not painful and has no discharge.  Doing well on her antihypertensive.  Neuropathy is controlled on Lyrica and she has had no hypoglycemic episodes so no visual concerns. Ophalmology appointment was with Dr Cordelia Pen 3 months ago. 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   Qualifier: Diagnosis of  By: Jorene Minors, [place]     Hypertension    MRSA (methicillin resistant Staphylococcus aureus)    Trichomonas    VAGINITIS, BACTERIAL, RECURRENT 08/15/2007   Qualifier: Diagnosis of  By: Radene Ou MD, [place]      Past Surgical History:  Procedure Laterality Date   BREAST BIOPSY Left 2018   CESAREAN SECTION     breech    Family History  Problem Relation Age of Onset   Colon cancer Mother    Liver disease Sister    Other Neg Hx    Breast cancer Neg Hx    Esophageal cancer Neg Hx    Rectal cancer Neg Hx    Tremor Neg Hx     Social History   Socioeconomic History   Marital status: Legally Separated     Spouse name: Not on file   Number of children: 1   Years of education: Not on file   Highest education level: 9th grade  Occupational History   Occupation: disability  Tobacco Use   Smoking status: Never   Smokeless tobacco: Never  Vaping Use   Vaping Use: Never used  Substance and Sexual Activity   Alcohol use: No   Drug use: Not Currently    Types: Cocaine, Marijuana    Comment: remote h/o cocaine 2015 and marijuana 2018   Sexual activity: Not Currently  Other Topics Concern   Not on file  Social History Narrative   Lives with friend, Rosalee Kaufman and nephew.  One daughter.    Social Determinants of Health   Financial Resource Strain: Low Risk  (12/29/2021)   Overall Financial Resource Strain (CARDIA)    Difficulty of Paying Living Expenses: Not very hard  Food Insecurity: No Food Insecurity (11/26/2021)   Hunger Vital Sign    Worried About Running Out of Food in the Last Year: Never true    Ran Out of Food in the Last Year: Never true  Transportation Needs: No Transportation Needs (04/27/2022)   PRAPARE - Hydrologist (Medical): No    Lack of Transportation (Non-Medical): No  Physical Activity: Insufficiently Active (02/04/2022)   Exercise  Vital Sign    Days of Exercise per Week: 1 day    Minutes of Exercise per Session: 30 min  Stress: No Stress Concern Present (04/13/2022)   [place]    Feeling of Stress : Not at all  Social Connections: Moderately Integrated (12/29/2021)   Social Connection and Isolation Panel [NHANES]    Frequency of Communication with Friends and Family: More than three times a week    Frequency of Social Gatherings with Friends and Family: More than three times a week    Attends Religious Services: More than 4 times per year    Active Member of [employer] or Organizations: No    Attends Archivist Meetings: Never    Marital Status: Living with partner     Allergies  Allergen Reactions   Lisinopril Swelling    Outpatient Medications Prior to Visit  Medication Sig Dispense Refill   albuterol (VENTOLIN HFA) 108 (90 Base) MCG/ACT inhaler Inhale 1 puff into the lungs every 4 (four) hours as needed for wheezing or shortness of breath. 18 g 0   famotidine (PEPCID) 40 MG tablet TAKE ONE TABLET BY MOUTH daily 30 tablet 3   ferrous sulfate 325 (65 FE) MG tablet Take 325 mg by mouth daily with breakfast.     hydrALAZINE (APRESOLINE) 50 MG tablet TAKE ONE TABLET BY MOUTH THREE TIMES DAILY 270 tablet 1   methimazole (TAPAZOLE) 5 MG tablet Take 1 tablet (5 mg total) by mouth daily. 90 tablet 1   mometasone-formoterol (DULERA) 100-5 MCG/ACT AERO Inhale 2 puffs into the lungs as needed for wheezing.     nystatin ointment (MYCOSTATIN) Apply 1 application. topically 2 (two) times daily as needed (rash/skin irritation). 30 g 1   spironolactone (ALDACTONE) 25 MG tablet Take 0.5 tablets (12.5 mg total) by mouth daily. 30 tablet 3   torsemide (DEMADEX) 20 MG tablet Take 2 tablets (40 mg total) by mouth daily. 90 tablet 6   amLODipine (NORVASC) 2.5 MG tablet Take 1 tablet (2.5 mg total) by mouth daily. 90 tablet 1   atorvastatin (LIPITOR) 40 MG tablet Take 1 tablet (40 mg total) by mouth every morning. 90 tablet 1   empagliflozin (JARDIANCE) 10 MG TABS tablet Take 1 tablet (10 mg total) by mouth daily. 90 tablet 1   oxybutynin (DITROPAN) 5 MG tablet Take 1 tablet (5 mg total) by mouth 2 (two) times daily. 60 tablet 6   OZEMPIC, 0.25 OR 0.5 MG/DOSE, 2 MG/3ML SOPN INJECT 0.5 MG into THE SKIN ONCE A WEEK 3 mL 1   pregabalin (LYRICA) 75 MG capsule TAKE ONE CAPSULE BY MOUTH TWICE DAILY 180 capsule 1   carvedilol (COREG) 25 MG tablet Take 1 tablet (25 mg total) by mouth 2 (two) times daily. 180 tablet 1   No facility-administered medications prior to visit.     ROS Review of Systems  Constitutional:  Negative for activity change and appetite change.  HENT:   Negative for sinus pressure and sore throat.   Respiratory:  Negative for chest tightness, shortness of breath and wheezing.   Cardiovascular:  Negative for chest pain and palpitations.  Gastrointestinal:  Negative for abdominal distention, abdominal pain and constipation.  Genitourinary: Negative.   Musculoskeletal: Negative.   Psychiatric/Behavioral:  Negative for behavioral problems and dysphoric mood.     Objective:  BP (!) 141/85   Pulse 65   Wt 196 lb (88.9 kg)   LMP 04/01/2010   SpO2 99%  BMI 33.64 kg/m      05/31/2022   11:46 AM 05/31/2022   11:24 AM 05/31/2022    9:15 AM  BP/Weight  Systolic BP 630 160   Diastolic BP 85 87   Wt. (Lbs)  196 185  BMI  33.64 kg/m2 31.76 kg/m2      Physical Exam Constitutional:      Appearance: She is well-developed.  Eyes:     Comments: Supraorbital edema Proptosis has improved  Cardiovascular:     Rate and Rhythm: Normal rate.     Heart sounds: Normal heart sounds. No murmur heard. Pulmonary:     Effort: Pulmonary effort is normal.     Breath sounds: Normal breath sounds. No wheezing or rales.  Chest:     Chest wall: No tenderness.  Abdominal:     General: Bowel sounds are normal. There is no distension.     Palpations: Abdomen is soft. There is no mass.     Tenderness: There is no abdominal tenderness.  Genitourinary:    Comments: Hemorrhoid at 6 o'clock Musculoskeletal:        General: Normal range of motion.     Right lower leg: No edema.     Left lower leg: No edema.  Neurological:     Mental Status: She is alert and oriented to person, place, and time.  Psychiatric:        Mood and Affect: Mood normal.        Latest Ref Rng & Units 03/29/2022    9:39 AM 03/16/2022    3:01 PM 12/21/2021    2:19 PM  CMP  Glucose 70 - 99 mg/dL 161  148  181   BUN 8 - 23 mg/dL 40  55  71   Creatinine 0.44 - 1.00 mg/dL 1.91  2.01  1.78   Sodium 135 - 145 mmol/L 141  139  142   Potassium 3.5 - 5.1 mmol/L 4.1  4.2  4.2    Chloride 98 - 111 mmol/L 111  105  104   CO2 22 - 32 mmol/L _0 Calcium 8.9 - 10.3 mg/dL 9.4  9.4  10.3     Lipid Panel     Component Value Date/Time   CHOL 113 02/10/2022 1043   TRIG 81 02/10/2022 1043   HDL 52 02/10/2022 1043   CHOLHDL 2.5 01/29/2019 1118   CHOLHDL 3 06/18/2014 0951   VLDL 19.6 06/18/2014 0951   LDLCALC 45 02/10/2022 1043    CBC    Component Value Date/Time   WBC 3.6 (L) 09/25/2021 0315   RBC 3.11 (L) 09/25/2021 0315   HGB 8.0 (L) 09/25/2021 0315   HGB 9.8 (L) 07/10/2019 1544   HCT 26.3 (L) 09/25/2021 0315   HCT 30.9 (L) 07/10/2019 1544   PLT 168 09/25/2021 0315   PLT 171 07/10/2019 1544   MCV 84.6 09/25/2021 0315   MCV 86 07/10/2019 1544   MCH 25.7 (L) 09/25/2021 0315   MCHC 30.4 09/25/2021 0315   RDW 13.8 09/25/2021 0315   RDW 12.7 07/10/2019 1544   LYMPHSABS 1.0 09/22/2021 1209   LYMPHSABS 1.3 07/10/2019 1544   MONOABS 0.4 09/22/2021 1209   EOSABS 0.0 09/22/2021 1209   EOSABS 0.0 07/10/2019 1544   BASOSABS 0.0 09/22/2021 1209   BASOSABS 0.0 07/10/2019 1544    Lab Results  Component Value Date   HGBA1C 7.5 (A) 05/31/2022   Lab Results  Component Value Date   TSH 0.006 (L)  05/28/2022     Assessment & Plan:  1. Type 2 diabetes mellitus with other neurologic complication, with long-term current use of insulin (HCC) Suboptimally controlled with A1c of 7.5; goal is less than 7.0 Will increase Ozempic dose Counseled on Diabetic diet, my plate method, 762 minutes of moderate intensity exercise/week Blood sugar logs with fasting goals of 80-120 mg/dl, random of less than 180 and in the event of sugars less than 60 mg/dl or greater than 400 mg/dl encouraged to notify the clinic. Advised on the need for annual eye exams, annual foot exams, Pneumonia vaccine. - POCT glucose (manual entry) - POCT glycosylated hemoglobin (Hb A1C) - Microalbumin/Creatinine Ratio, Urine - Semaglutide, 1 MG/DOSE, 4 MG/3ML SOPN; Inject 1 mg as directed  once a week.  Dispense: 3 mL; Refill: 6 - atorvastatin (LIPITOR) 40 MG tablet; Take 1 tablet (40 mg total) by mouth every morning.  Dispense: 90 tablet; Refill: 1 - pregabalin (LYRICA) 75 MG capsule; Take 1 capsule (75 mg total) by mouth 2 (two) times daily.  Dispense: 180 capsule; Refill: 1 - CMP14+EGFR  2. 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 ([place]) Uncontrolled She is yet to take her antihypertensive today Counseled on blood pressure goal of less than 130/80, low-sodium, DASH diet, medication compliance, 150 minutes of moderate intensity exercise per week. Discussed medication compliance, adverse effects. - amLODipine (NORVASC) 2.5 MG tablet; Take 1 tablet (2.5 mg total) by mouth daily.  Dispense: 90 tablet; Refill: 1  3. Hypertensive heart disease with chronic diastolic congestive heart failure ([place]) Euvolemic with EF of 60 to 65% from echo of 09/2021 Continue SGLT2i, beta-blocker, spironolactone, hydralazine - empagliflozin (JARDIANCE) 10 MG TABS tablet; Take 1 tablet (10 mg total) by mouth daily.  Dispense: 90 tablet; Refill: 1  4. Urinary incontinence, mixed Stable - oxybutynin (DITROPAN) 5 MG tablet; Take 1 tablet (5 mg total) by mouth 2 (two) times daily.  Dispense: 60 tablet; Refill: 6  5. Other hemorrhoids Counseled on preventing constipation, use of sitz bath's - hydrocortisone-pramoxine (ANALPRAM HC) 2.5-1 % rectal cream; Place 1 Application rectally 3 (three) times daily.  Dispense: 30 g; Refill: 1  6. Hyperthyroidism Suppressed TSH with slight improvement compared to previous labs With possible thyroid eye disease - advised to discuss this with her pulmonologist Currently being weaned off methimazole so she can undergo radioactive iodine scan Currently on a beta-blocker Follow-up with endocrinology and for definitive treatment of goiter  Meds ordered this encounter  Medications   Semaglutide, 1 MG/DOSE, 4 MG/3ML SOPN     Sig: Inject 1 mg as directed once a week.    Dispense:  3 mL    Refill:  6    Dose increase   amLODipine (NORVASC) 2.5 MG tablet    Sig: Take 1 tablet (2.5 mg total) by mouth daily.    Dispense:  90 tablet    Refill:  1   atorvastatin (LIPITOR) 40 MG tablet    Sig: Take 1 tablet (40 mg total) by mouth every morning.    Dispense:  90 tablet    Refill:  1   empagliflozin (JARDIANCE) 10 MG TABS tablet    Sig: Take 1 tablet (10 mg total) by mouth daily.    Dispense:  90 tablet    Refill:  1   oxybutynin (DITROPAN) 5 MG tablet    Sig: Take 1 tablet (5 mg total) by mouth 2 (two) times daily.    Dispense:  60 tablet  Refill:  6   pregabalin (LYRICA) 75 MG capsule    Sig: Take 1 capsule (75 mg total) by mouth 2 (two) times daily.    Dispense:  180 capsule    Refill:  1   hydrocortisone-pramoxine (ANALPRAM HC) 2.5-1 % rectal cream    Sig: Place 1 Application rectally 3 (three) times daily.    Dispense:  30 g    Refill:  1    Follow-up: Return in about 3 months (around 08/31/2022) for Chronic medical conditions.       [name], MD, FAAFP. [facility] [place] [place]   05/31/2022, 12:49 PM

## 2022-06-01 ENCOUNTER — Other Ambulatory Visit (HOSPITAL_COMMUNITY): Payer: Self-pay | Admitting: Emergency Medicine

## 2022-06-01 NOTE — Progress Notes (Signed)
Paramedicine Encounter    Patient ID: [name] [name], female    DOB: 10/30/1960, 61 y.o.   MRN: [id number]   BP 130/80 (BP Location: Right Arm, Patient Position: Sitting, Cuff Size: Normal)   Pulse 67   Resp 16   Wt 196 lb 3.2 oz (89 kg)   LMP 04/01/2010   SpO2 96%   BMI 33.68 kg/m  Weight yesterday-196.2lb Last visit weight-195lb  ATF Ms. Chaney A&O x 4, skin W&D w/ good color.  She denies chest pain or SOB.  Lung sounds clear and equal bilat.  No edema noted.  She has done great with her med compliance this visit.  She did advise me that she was told to taper of her Methimazole.  She is to take it once a day for the next 5 days and then discontinue the medicine pending an upcoming test/procedure.  I supervised her reconcile her pill box x 1 week w/o incident.  Refills called in for Spironolactone called into [facility].  Reminded her she has a pending appointment for Mammography tomorrow.  Home visit complete.    [name], [place] 06/01/2022   Patient Care Team: [name], MD as PCP - General (Family Medicine) Minus Breeding, MD as PCP - Cardiology (Cardiology) Craft, Lorel Monaco, RN as Case Manager [place], Tami Lin, [place] as Physician Assistant (Cardiology) Ethelda Chick as Social Worker  Patient Active Problem List   Diagnosis Date Noted   Hyperthyroidism 04/23/2022   Acute on chronic heart failure with preserved ejection fraction (HFpEF) ([place]) 09/22/2021   Heart failure ([place]) 09/22/2021   CKD (chronic kidney disease) stage 4, GFR 15-29 ml/min ([place]) 07/03/2021   Acute respiratory failure due to COVID-19 ([place]) 05/06/2021   COVID-19 05/05/2021   CHF exacerbation ([place]) 04/02/2021   Chest pain 04/02/2021   Acute-on-chronic kidney injury ([place]) 04/02/2021   COPD exacerbation ([place]) 12/05/2020   CKD (chronic kidney disease), stage III ([place]) 12/04/2020   Anemia of chronic disease 12/04/2020   Obesity, Class III, BMI 40-49.9 (morbid obesity)  ([place]) 07/27/2020   Acute respiratory disease due to COVID-19 virus 07/25/2020   Acute hypoxemic respiratory failure due to COVID-19 ([place]) 07/25/2020   Morbid obesity ([place]) 11/23/2019   Acute respiratory failure with hypoxia ([place]) 11/06/2019   Chronic kidney disease, stage 3b ([place]) 11/06/2019   COPD without exacerbation ([place])    Acute kidney failure ([place]) 08/18/2019   Left ventricular hypertrophy 07/21/2019   Educated about COVID-19 virus infection 54/65/0354   Acute diastolic HF (heart failure) (HCC)    Hypoxia    OSA (obstructive sleep apnea) 05/09/2019   Hyperglycemia 04/09/2019   Snoring 03/13/2019   Chronic anemia 08/17/2018   Chronic cystitis 08/03/2018   Diabetic neuropathy ([place]) 06/16/2017   Non compliance w medication regimen 04/12/2017   Family history of colon cancer in mother 11/17/2016   Dyspareunia in female 11/11/2016   Screen for colon cancer 11/11/2016   History of ovarian cyst 11/11/2016   Chronic hepatitis C without hepatic coma ([place]) 11/09/2016   Hyperlipidemia 10/07/2016   Type 2 diabetes mellitus with hyperlipidemia ([place]) 01/29/2014   Status post intraocular lens implant 11/08/2013   PCO (posterior capsular opacification) 09/28/2013   Pseudophakia 09/12/2013   GERD (gastroesophageal reflux disease) 09/06/2013   Hypertension 09/06/2013   ANEMIA, IRON DEFICIENCY 10/03/2009   LIPOMA 06/18/2009   TINEA PEDIS 05/07/2009   SUBSTANCE ABUSE, MULTIPLE 02/22/2007   HCVD (hypertensive cardiovascular disease) 02/22/2007    Current Outpatient Medications:    albuterol (  VENTOLIN HFA) 108 (90 Base) MCG/ACT inhaler, Inhale 1 puff into the lungs every 4 (four) hours as needed for wheezing or shortness of breath., Disp: 18 g, Rfl: 0   amLODipine (NORVASC) 2.5 MG tablet, Take 1 tablet (2.5 mg total) by mouth daily., Disp: 90 tablet, Rfl: 1   atorvastatin (LIPITOR) 40 MG tablet, Take 1 tablet (40 mg total) by mouth every morning., Disp: 90 tablet, Rfl: 1   empagliflozin  (JARDIANCE) 10 MG TABS tablet, Take 1 tablet (10 mg total) by mouth daily., Disp: 90 tablet, Rfl: 1   famotidine (PEPCID) 40 MG tablet, TAKE ONE TABLET BY MOUTH daily, Disp: 30 tablet, Rfl: 3   ferrous sulfate 325 (65 FE) MG tablet, Take 325 mg by mouth daily with breakfast., Disp: , Rfl:    hydrALAZINE (APRESOLINE) 50 MG tablet, TAKE ONE TABLET BY MOUTH THREE TIMES DAILY, Disp: 270 tablet, Rfl: 1   hydrocortisone-pramoxine (ANALPRAM HC) 2.5-1 % rectal cream, Place 1 Application rectally 3 (three) times daily., Disp: 30 g, Rfl: 1   methimazole (TAPAZOLE) 5 MG tablet, Take 1 tablet (5 mg total) by mouth daily., Disp: 90 tablet, Rfl: 1   mometasone-formoterol (DULERA) 100-5 MCG/ACT AERO, Inhale 2 puffs into the lungs as needed for wheezing., Disp: , Rfl:    nystatin ointment (MYCOSTATIN), Apply 1 application. topically 2 (two) times daily as needed (rash/skin irritation)., Disp: 30 g, Rfl: 1   oxybutynin (DITROPAN) 5 MG tablet, Take 1 tablet (5 mg total) by mouth 2 (two) times daily., Disp: 60 tablet, Rfl: 6   pregabalin (LYRICA) 75 MG capsule, Take 1 capsule (75 mg total) by mouth 2 (two) times daily., Disp: 180 capsule, Rfl: 1   Semaglutide, 1 MG/DOSE, 4 MG/3ML SOPN, Inject 1 mg as directed once a week., Disp: 3 mL, Rfl: 6   spironolactone (ALDACTONE) 25 MG tablet, Take 0.5 tablets (12.5 mg total) by mouth daily., Disp: 30 tablet, Rfl: 3   torsemide (DEMADEX) 20 MG tablet, Take 2 tablets (40 mg total) by mouth daily., Disp: 90 tablet, Rfl: 6   carvedilol (COREG) 25 MG tablet, Take 1 tablet (25 mg total) by mouth 2 (two) times daily., Disp: 180 tablet, Rfl: 1 Allergies  Allergen Reactions   Lisinopril Swelling      Social History   Socioeconomic History   Marital status: Legally Separated    Spouse name: Not on file   Number of children: 1   Years of education: Not on file   Highest education level: 9th grade  Occupational History   Occupation: disability  Tobacco Use   Smoking status:  Never   Smokeless tobacco: Never  Vaping Use   Vaping Use: Never used  Substance and Sexual Activity   Alcohol use: No   Drug use: Not Currently    Types: Cocaine, Marijuana    Comment: remote h/o cocaine 2015 and marijuana 2018   Sexual activity: Not Currently  Other Topics Concern   Not on file  Social History Narrative   Lives with friend, Rosalee Kaufman and nephew.  One daughter.    Social Determinants of Health   Financial Resource Strain: Low Risk  (12/29/2021)   Overall Financial Resource Strain (CARDIA)    Difficulty of Paying Living Expenses: Not very hard  Food Insecurity: No Food Insecurity (11/26/2021)   Hunger Vital Sign    Worried About Running Out of Food in the Last Year: Never true    Ran Out of Food in the Last Year: Never true  Transportation Needs:  No Transportation Needs (04/27/2022)   PRAPARE - Hydrologist (Medical): No    Lack of Transportation (Non-Medical): No  Physical Activity: Insufficiently Active (02/04/2022)   Exercise Vital Sign    Days of Exercise per Week: 1 day    Minutes of Exercise per Session: 30 min  Stress: No Stress Concern Present (04/13/2022)   [place]    Feeling of Stress : Not at all  Social Connections: Moderately Integrated (12/29/2021)   Social Connection and Isolation Panel [NHANES]    Frequency of Communication with Friends and Family: More than three times a week    Frequency of Social Gatherings with Friends and Family: More than three times a week    Attends Religious Services: More than 4 times per year    Active Member of [employer] or Organizations: No    Attends Archivist Meetings: Never    Marital Status: Living with partner  Intimate Partner Violence: Not At Risk (02/04/2022)   Humiliation, Afraid, Rape, and Kick questionnaire    Fear of Current or Ex-Partner: No    Emotionally Abused: No    Physically Abused: No     Sexually Abused: No    Physical Exam      Future Appointments  Date Time Provider [place]  06/02/2022  9:20 AM [facility] DIAG [place] 1 GI-[facility]GMM [facility]  06/02/2022  9:30 AM GI-[facility]G [place] 1 GI-BCGUS GI-BREAST CE  06/02/2022  9:35 AM GI-BCG [place] 1 GI-BCGUS GI-BREAST CE  06/16/2022 10:30 AM [facility] PA/NP [facility] None  06/18/2022  9:00 AM Craft, Lorel Monaco, RN CHL-POP[facility] None  07/05/2022  2:30 PM Brita Romp, NP REA-REA None  09/06/2022 10:10 AM [name], MD CHW-[facility] None       [name], [place] Paramedic  06/01/22

## 2022-06-02 ENCOUNTER — Ambulatory Visit
Admission: RE | Admit: 2022-06-02 | Discharge: 2022-06-02 | Disposition: A | Discharge status: Home / Self Care | Payer: Medicare Other | Source: Ambulatory Visit | Attending: Family Medicine | Admitting: Family Medicine

## 2022-06-02 ENCOUNTER — Other Ambulatory Visit: Payer: Self-pay | Admitting: Family Medicine

## 2022-06-02 DIAGNOSIS — N6320 Unspecified lump in the left breast, unspecified quadrant: Secondary | ICD-10-CM

## 2022-06-02 DIAGNOSIS — N6310 Unspecified lump in the right breast, unspecified quadrant: Secondary | ICD-10-CM

## 2022-06-02 DIAGNOSIS — N644 Mastodynia: Secondary | ICD-10-CM

## 2022-06-02 DIAGNOSIS — R599 Enlarged lymph nodes, unspecified: Secondary | ICD-10-CM

## 2022-06-04 ENCOUNTER — Other Ambulatory Visit (HOSPITAL_COMMUNITY): Payer: Self-pay | Admitting: Cardiology

## 2022-06-04 DIAGNOSIS — E1122 Type 2 diabetes mellitus with diabetic chronic kidney disease: Secondary | ICD-10-CM

## 2022-06-08 ENCOUNTER — Ambulatory Visit
Admission: RE | Admit: 2022-06-08 | Discharge: 2022-06-08 | Disposition: A | Discharge status: Home / Self Care | Payer: Medicare Other | Source: Ambulatory Visit | Attending: Family Medicine | Admitting: Family Medicine

## 2022-06-08 ENCOUNTER — Other Ambulatory Visit (HOSPITAL_COMMUNITY): Payer: Self-pay | Admitting: Emergency Medicine

## 2022-06-08 DIAGNOSIS — R599 Enlarged lymph nodes, unspecified: Secondary | ICD-10-CM

## 2022-06-08 DIAGNOSIS — N6320 Unspecified lump in the left breast, unspecified quadrant: Secondary | ICD-10-CM

## 2022-06-08 DIAGNOSIS — N6321 Unspecified lump in the left breast, upper outer quadrant: Secondary | ICD-10-CM

## 2022-06-08 DIAGNOSIS — N6310 Unspecified lump in the right breast, unspecified quadrant: Secondary | ICD-10-CM

## 2022-06-08 HISTORY — PX: BREAST BIOPSY: SHX20

## 2022-06-08 NOTE — Progress Notes (Signed)
Paramedicine Encounter    Patient ID: [name] [name], female    DOB: August 03, 1960, 61 y.o.   MRN: [id number]   BP 130/70 (BP Location: Left Arm, Patient Position: Sitting, Cuff Size: Normal)   Pulse 83   Resp 16   Wt 197 lb (89.4 kg)   LMP 04/01/2010   SpO2 97%   BMI 33.81 kg/m  Weight yesterday-not taken Last visit weight-196lb  ATF [name] [name] CA&O x 4, skin W&D w/ good color.  Pt had been to the doctor earlier today for a mammogram w/ biopsies.  She says's she is sore from this procedure and was advised to take Tylenol as needed for pain/discomfort. [name] [name] denies chest pain or SOB.  Lung sounds clear and equal bilat.  No edema noted.  She continues to do well with her med compliance and is maintaining her weight within a good range.  Med box reconciled x 1 week.  Refills called into [facility].  Semiglutide will be delivered tomorrow and the rest will be delivered 11/29.  Home visit complete.    [name], [place] 06/08/2022   Patient Care Team: [name], MD as PCP - General (Family Medicine) Minus Breeding, MD as PCP - Cardiology (Cardiology) Craft, Lorel Monaco, RN as Case Manager [place], Tami Lin, Lillie as Physician Assistant (Cardiology) Ethelda Chick as Social Worker  Patient Active Problem List   Diagnosis Date Noted   Hyperthyroidism 04/23/2022   Acute on chronic heart failure with preserved ejection fraction (HFpEF) ([place]) 09/22/2021   Heart failure ([place]) 09/22/2021   CKD (chronic kidney disease) stage 4, GFR 15-29 ml/min ([place]) 07/03/2021   Acute respiratory failure due to COVID-19 ([place]) 05/06/2021   COVID-19 05/05/2021   CHF exacerbation ([place]) 04/02/2021   Chest pain 04/02/2021   Acute-on-chronic kidney injury ([place]) 04/02/2021   COPD exacerbation ([place]) 12/05/2020   CKD (chronic kidney disease), stage III ([place]) 12/04/2020   Anemia of chronic disease 12/04/2020   Obesity, Class III, BMI 40-49.9 (morbid obesity) ([place])  07/27/2020   Acute respiratory disease due to COVID-19 virus 07/25/2020   Acute hypoxemic respiratory failure due to COVID-19 ([place]) 07/25/2020   Morbid obesity ([place]) 11/23/2019   Acute respiratory failure with hypoxia ([place]) 11/06/2019   Chronic kidney disease, stage 3b ([place]) 11/06/2019   COPD without exacerbation ([place])    Acute kidney failure ([place]) 08/18/2019   Left ventricular hypertrophy 07/21/2019   Educated about COVID-19 virus infection 69/62/9528   Acute diastolic HF (heart failure) (HCC)    Hypoxia    OSA (obstructive sleep apnea) 05/09/2019   Hyperglycemia 04/09/2019   Snoring 03/13/2019   Chronic anemia 08/17/2018   Chronic cystitis 08/03/2018   Diabetic neuropathy ([place]) 06/16/2017   Non compliance w medication regimen 04/12/2017   Family history of colon cancer in mother 11/17/2016   Dyspareunia in female 11/11/2016   Screen for colon cancer 11/11/2016   History of ovarian cyst 11/11/2016   Chronic hepatitis C without hepatic coma ([place]) 11/09/2016   Hyperlipidemia 10/07/2016   Type 2 diabetes mellitus with hyperlipidemia ([place]) 01/29/2014   Status post intraocular lens implant 11/08/2013   PCO (posterior capsular opacification) 09/28/2013   Pseudophakia 09/12/2013   GERD (gastroesophageal reflux disease) 09/06/2013   Hypertension 09/06/2013   ANEMIA, IRON DEFICIENCY 10/03/2009   LIPOMA 06/18/2009   TINEA PEDIS 05/07/2009   SUBSTANCE ABUSE, MULTIPLE 02/22/2007   HCVD (hypertensive cardiovascular disease) 02/22/2007    Current Outpatient Medications:    amLODipine (NORVASC) 2.5 MG tablet, Take  1 tablet (2.5 mg total) by mouth daily., Disp: 90 tablet, Rfl: 1   atorvastatin (LIPITOR) 40 MG tablet, Take 1 tablet (40 mg total) by mouth every morning., Disp: 90 tablet, Rfl: 1   carvedilol (COREG) 25 MG tablet, TAKE ONE TABLET BY MOUTH twice daily, Disp: 180 tablet, Rfl: 3   empagliflozin (JARDIANCE) 10 MG TABS tablet, Take 1 tablet (10 mg total) by mouth daily., Disp: 90  tablet, Rfl: 1   famotidine (PEPCID) 40 MG tablet, TAKE ONE TABLET BY MOUTH daily, Disp: 30 tablet, Rfl: 3   ferrous sulfate 325 (65 FE) MG tablet, Take 325 mg by mouth daily with breakfast., Disp: , Rfl:    hydrALAZINE (APRESOLINE) 50 MG tablet, TAKE ONE TABLET BY MOUTH THREE TIMES DAILY, Disp: 270 tablet, Rfl: 1   hydrocortisone-pramoxine (ANALPRAM HC) 2.5-1 % rectal cream, Place 1 Application rectally 3 (three) times daily., Disp: 30 g, Rfl: 1   oxybutynin (DITROPAN) 5 MG tablet, Take 1 tablet (5 mg total) by mouth 2 (two) times daily., Disp: 60 tablet, Rfl: 6   pregabalin (LYRICA) 75 MG capsule, Take 1 capsule (75 mg total) by mouth 2 (two) times daily., Disp: 180 capsule, Rfl: 1   Semaglutide, 1 MG/DOSE, 4 MG/3ML SOPN, Inject 1 mg as directed once a week., Disp: 3 mL, Rfl: 6   spironolactone (ALDACTONE) 25 MG tablet, Take 0.5 tablets (12.5 mg total) by mouth daily., Disp: 30 tablet, Rfl: 3   torsemide (DEMADEX) 20 MG tablet, Take 2 tablets (40 mg total) by mouth daily., Disp: 90 tablet, Rfl: 6   albuterol (VENTOLIN HFA) 108 (90 Base) MCG/ACT inhaler, Inhale 1 puff into the lungs every 4 (four) hours as needed for wheezing or shortness of breath. (Patient not taking: Reported on 06/08/2022), Disp: 18 g, Rfl: 0   methimazole (TAPAZOLE) 5 MG tablet, Take 1 tablet (5 mg total) by mouth daily. (Patient not taking: Reported on 06/08/2022), Disp: 90 tablet, Rfl: 1   mometasone-formoterol (DULERA) 100-5 MCG/ACT AERO, Inhale 2 puffs into the lungs as needed for wheezing. (Patient not taking: Reported on 06/08/2022), Disp: , Rfl:    nystatin ointment (MYCOSTATIN), Apply 1 application. topically 2 (two) times daily as needed (rash/skin irritation). (Patient not taking: Reported on 06/08/2022), Disp: 30 g, Rfl: 1 Allergies  Allergen Reactions   Lisinopril Swelling      Social History   Socioeconomic History   Marital status: Legally Separated    Spouse name: Not on file   Number of children: 1    Years of education: Not on file   Highest education level: 9th grade  Occupational History   Occupation: disability  Tobacco Use   Smoking status: Never   Smokeless tobacco: Never  Vaping Use   Vaping Use: Never used  Substance and Sexual Activity   Alcohol use: No   Drug use: Not Currently    Types: Cocaine, Marijuana    Comment: remote h/o cocaine 2015 and marijuana 2018   Sexual activity: Not Currently  Other Topics Concern   Not on file  Social History Narrative   Lives with friend, Rosalee Kaufman and nephew.  One daughter.    Social Determinants of Health   Financial Resource Strain: Low Risk  (12/29/2021)   Overall Financial Resource Strain (CARDIA)    Difficulty of Paying Living Expenses: Not very hard  Food Insecurity: No Food Insecurity (11/26/2021)   Hunger Vital Sign    Worried About Running Out of Food in the Last Year: Never true  Ran Out of Food in the Last Year: Never true  Transportation Needs: No Transportation Needs (04/27/2022)   PRAPARE - Hydrologist (Medical): No    Lack of Transportation (Non-Medical): No  Physical Activity: Insufficiently Active (02/04/2022)   Exercise Vital Sign    Days of Exercise per Week: 1 day    Minutes of Exercise per Session: 30 min  Stress: No Stress Concern Present (04/13/2022)   [place]    Feeling of Stress : Not at all  Social Connections: Moderately Integrated (12/29/2021)   Social Connection and Isolation Panel [NHANES]    Frequency of Communication with Friends and Family: More than three times a week    Frequency of Social Gatherings with Friends and Family: More than three times a week    Attends Religious Services: More than 4 times per year    Active Member of [employer] or Organizations: No    Attends Archivist Meetings: Never    Marital Status: Living with partner  Intimate Partner Violence: Not At Risk (02/04/2022)    Humiliation, Afraid, Rape, and Kick questionnaire    Fear of Current or Ex-Partner: No    Emotionally Abused: No    Physically Abused: No    Sexually Abused: No    Physical Exam      Future Appointments  Date Time Provider [place]  06/16/2022 10:30 AM [facility] PA/NP [facility] None  06/18/2022  9:00 AM Craft, Lorel Monaco, RN [facility] None  07/02/2022 10:30 AM [facility] COVERING PROVIDER 2 [facility] None  07/05/2022  2:30 PM Brita Romp, NP [facility] None  09/06/2022 10:10 AM [name], MD [facility] None       [name], EMT-P-Paramedic [place] [facility]  06/08/22

## 2022-06-11 ENCOUNTER — Other Ambulatory Visit: Payer: Self-pay | Admitting: Family Medicine

## 2022-06-11 ENCOUNTER — Other Ambulatory Visit (HOSPITAL_COMMUNITY): Payer: Self-pay | Admitting: Family Medicine

## 2022-06-14 NOTE — Telephone Encounter (Signed)
Refilled 03/24/2022 #30 [id number] rf Requested Prescriptions  Pending Prescriptions Disp Refills   famotidine (PEPCID) 40 MG tablet [Pharmacy Med Name: famotidine 40 mg tablet] 30 tablet 3    Sig: TAKE ONE TABLET BY MOUTH daily     There is no refill protocol information for this order

## 2022-06-16 ENCOUNTER — Telehealth (HOSPITAL_COMMUNITY): Payer: Self-pay | Admitting: Emergency Medicine

## 2022-06-16 ENCOUNTER — Encounter (HOSPITAL_COMMUNITY): Payer: Self-pay

## 2022-06-16 ENCOUNTER — Telehealth (HOSPITAL_COMMUNITY): Payer: Self-pay | Admitting: *Deleted

## 2022-06-16 ENCOUNTER — Other Ambulatory Visit (HOSPITAL_COMMUNITY): Payer: Self-pay | Admitting: Emergency Medicine

## 2022-06-16 ENCOUNTER — Ambulatory Visit (HOSPITAL_COMMUNITY)
Admission: RE | Admit: 2022-06-16 | Discharge: 2022-06-16 | Disposition: A | Discharge status: Home / Self Care | Payer: Medicare Other | Source: Ambulatory Visit | Attending: Family Medicine | Admitting: Family Medicine

## 2022-06-16 VITALS — BP 144/88 | HR 70 | Wt 199.4 lb

## 2022-06-16 DIAGNOSIS — Z7984 Long term (current) use of oral hypoglycemic drugs: Secondary | ICD-10-CM | POA: Diagnosis not present

## 2022-06-16 DIAGNOSIS — N183 Chronic kidney disease, stage 3 unspecified: Secondary | ICD-10-CM | POA: Insufficient documentation

## 2022-06-16 DIAGNOSIS — N184 Chronic kidney disease, stage 4 (severe): Secondary | ICD-10-CM

## 2022-06-16 DIAGNOSIS — Z79899 Other long term (current) drug therapy: Secondary | ICD-10-CM | POA: Diagnosis not present

## 2022-06-16 DIAGNOSIS — I5022 Chronic systolic (congestive) heart failure: Secondary | ICD-10-CM | POA: Diagnosis not present

## 2022-06-16 DIAGNOSIS — E1122 Type 2 diabetes mellitus with diabetic chronic kidney disease: Secondary | ICD-10-CM | POA: Insufficient documentation

## 2022-06-16 DIAGNOSIS — E785 Hyperlipidemia, unspecified: Secondary | ICD-10-CM | POA: Diagnosis not present

## 2022-06-16 DIAGNOSIS — R079 Chest pain, unspecified: Secondary | ICD-10-CM | POA: Diagnosis not present

## 2022-06-16 DIAGNOSIS — I5032 Chronic diastolic (congestive) heart failure: Secondary | ICD-10-CM

## 2022-06-16 DIAGNOSIS — G4733 Obstructive sleep apnea (adult) (pediatric): Secondary | ICD-10-CM | POA: Insufficient documentation

## 2022-06-16 DIAGNOSIS — Z6834 Body mass index (BMI) 34.0-34.9, adult: Secondary | ICD-10-CM | POA: Diagnosis not present

## 2022-06-16 DIAGNOSIS — E669 Obesity, unspecified: Secondary | ICD-10-CM | POA: Diagnosis not present

## 2022-06-16 DIAGNOSIS — I130 Hypertensive heart and chronic kidney disease with heart failure and stage 1 through stage 4 chronic kidney disease, or unspecified chronic kidney disease: Secondary | ICD-10-CM | POA: Diagnosis not present

## 2022-06-16 DIAGNOSIS — I10 Essential (primary) hypertension: Secondary | ICD-10-CM | POA: Diagnosis not present

## 2022-06-16 DIAGNOSIS — E0590 Thyrotoxicosis, unspecified without thyrotoxic crisis or storm: Secondary | ICD-10-CM | POA: Insufficient documentation

## 2022-06-16 LAB — BRAIN NATRIURETIC PEPTIDE: B Natriuretic Peptide: 31.8 pg/mL (ref 0.0–100.0)

## 2022-06-16 LAB — BASIC METABOLIC PANEL
Anion gap: 8 (ref 5–15)
BUN: 35 mg/dL — ABNORMAL HIGH (ref 8–23)
CO2: 24 mmol/L (ref 22–32)
Calcium: 9 mg/dL (ref 8.9–10.3)
Chloride: 111 mmol/L (ref 98–111)
Creatinine, Ser: 2.06 mg/dL — ABNORMAL HIGH (ref 0.44–1.00)
GFR, Estimated: 27 mL/min — ABNORMAL LOW (ref >60)
Glucose, Bld: 112 mg/dL — ABNORMAL HIGH (ref 70–99)
Potassium: 4 mmol/L (ref 3.5–5.1)
Sodium: 143 mmol/L (ref 135–145)

## 2022-06-16 MED ORDER — HYDRALAZINE HCL 50 MG PO TABS: 75.0000 mg | ORAL_TABLET | Freq: Three times a day (TID) | ORAL | 8 refills | Status: DC

## 2022-06-16 MED ORDER — TORSEMIDE 20 MG PO TABS: ORAL_TABLET | ORAL | 6 refills | Status: DC

## 2022-06-16 NOTE — Telephone Encounter (Signed)
Spoke with [name] [name] and explained the medication change with her Torsemide.  She was taking '40mg'$  daily and that's how her med box was set up. So now, she is to take '20mg'$ . Thursday (1 tab)  '40mg'$ . Fri. 2 (tab), Sat. '20mg'$ . (1tab), etc.  [name] advises she understands same.    [name], [place] 06/16/2022

## 2022-06-16 NOTE — Progress Notes (Addendum)
Advanced Heart Failure Clinic Note    PCP: [name], MD Primary Cardiologist: Minus Breeding, MD  HF Cardiologist: Dr. Aundra Dubin  HPI:  [name] [name] is a 61 y.o. with a history of HFpEF, DMII, HTN, OSA, and hyperlipidemia.    Had sleep study 08/18/21 that demonstrated mild obstructive sleep apnea overall (AHI 13.0/h; RDI 14.6/h); however, sleep apnea was moderately severe during REM sleep (AHI 29.4/h. Severe oxygen desaturation was noted during this study (Min O2 54.0%). Time spent < 89% was 81.7 minutes.   Admitted 09/15/21 with decompensated CHF. Diuresed with IV lasix and transitioned to torsemide 40 mg daily.    Admitted 09/22/21 with with decompensated HFpEF. Diuresed with IV lasix and transitioned to torsemide 20 mg daily, weight 233 lbs.  Referred to [facility] clinic. Post hospital follow up 3/23, she was volume overloaded in setting of dietary indiscretion, ReDs 47%. Torsemide increased and CardioMEMs discussed.  Echo in 3/23 showed EF 60-65%, normal RV.   Had HF f/u 12/01/21. Reds 48%. Torsemide increased to 60 mg twice a day x 2 days then 60 mg daily. Arlyce Harman was started.   Follow up 9/23, torsemide reduced to 40 mg daily with elevated SCr. Had episodes of typical and atypical CP and arranged for cardiac PET to assess for ischemia.   Today she returns for HF follow up with [name], paramedic. Overall feeling fine. She does not have dyspnea walking on flat ground or up steps. Chest tightness occurs very rarely now, not bothersome to her. Denies palpitations, abnormal bleeding,  dizziness, edema, or PND/Orthopnea. Appetite ok. No fever or chills. Weight at home 197 pounds. Taking all medications. Now following with Endocrinology. BP 130s/80s.  ECG (personally reviewed): NSR 69 bpm  Labs (4/23): K 4.0, creatinine 1.84 Labs (6/23): K 4.2, creatinine 1.78  Labs (7/23): LDL 45 Labs (8/23): TSH < 0.005 Labs (9/23): K 4.1, creatinine 1.91  PMH: 1. CKD stage 3 2. HCV 3. HTN: Angioedema with  ACEI.  4. Type 2 diabetes 5. OSA 6. Hyperthyroidism 7. Chronic diastolic CHF: Echo in 0/08 with EF 60-65%, normal RV.   Current Outpatient Medications  Medication Sig Dispense Refill   albuterol (VENTOLIN HFA) 108 (90 Base) MCG/ACT inhaler Inhale 1 puff into the lungs every 4 (four) hours as needed for wheezing or shortness of breath. (Patient not taking: Reported on 06/08/2022) 18 g 0   amLODipine (NORVASC) 2.5 MG tablet Take 1 tablet (2.5 mg total) by mouth daily. 90 tablet 1   atorvastatin (LIPITOR) 40 MG tablet Take 1 tablet (40 mg total) by mouth every morning. 90 tablet 1   carvedilol (COREG) 25 MG tablet TAKE ONE TABLET BY MOUTH twice daily 180 tablet 3   empagliflozin (JARDIANCE) 10 MG TABS tablet Take 1 tablet (10 mg total) by mouth daily. 90 tablet 1   famotidine (PEPCID) 40 MG tablet TAKE ONE TABLET BY MOUTH daily 30 tablet 3   ferrous sulfate 325 (65 FE) MG tablet Take 325 mg by mouth daily with breakfast.     hydrALAZINE (APRESOLINE) 50 MG tablet TAKE ONE TABLET BY MOUTH THREE TIMES DAILY 270 tablet 1   hydrocortisone-pramoxine (ANALPRAM HC) 2.5-1 % rectal cream Place 1 Application rectally 3 (three) times daily. 30 g 1   methimazole (TAPAZOLE) 5 MG tablet Take 1 tablet (5 mg total) by mouth daily. (Patient not taking: Reported on 06/08/2022) 90 tablet 1   mometasone-formoterol (DULERA) 100-5 MCG/ACT AERO Inhale 2 puffs into the lungs as needed for wheezing. (Patient not taking: Reported on  06/08/2022)     nystatin ointment (MYCOSTATIN) Apply 1 application. topically 2 (two) times daily as needed (rash/skin irritation). (Patient not taking: Reported on 06/08/2022) 30 g 1   oxybutynin (DITROPAN) 5 MG tablet Take 1 tablet (5 mg total) by mouth 2 (two) times daily. 60 tablet 6   pregabalin (LYRICA) 75 MG capsule Take 1 capsule (75 mg total) by mouth 2 (two) times daily. 180 capsule 1   Semaglutide, 1 MG/DOSE, 4 MG/3ML SOPN Inject 1 mg as directed once a week. 3 mL 6   spironolactone  (ALDACTONE) 25 MG tablet TAKE 1/2 TABLET BY MOUTH daily 30 tablet 3   torsemide (DEMADEX) 20 MG tablet Take 2 tablets (40 mg total) by mouth daily. 90 tablet 6   No current facility-administered medications for this encounter.   Allergies  Allergen Reactions   Lisinopril Swelling   Social History   Socioeconomic History   Marital status: Legally Separated    Spouse name: Not on file   Number of children: 1   Years of education: Not on file   Highest education level: 9th grade  Occupational History   Occupation: disability  Tobacco Use   Smoking status: Never   Smokeless tobacco: Never  Vaping Use   Vaping Use: Never used  Substance and Sexual Activity   Alcohol use: No   Drug use: Not Currently    Types: Cocaine, Marijuana    Comment: remote h/o cocaine 2015 and marijuana 2018   Sexual activity: Not Currently  Other Topics Concern   Not on file  Social History Narrative   Lives with friend, Rosalee Kaufman and nephew.  One daughter.    Social Determinants of Health   Financial Resource Strain: Low Risk  (12/29/2021)   Overall Financial Resource Strain (CARDIA)    Difficulty of Paying Living Expenses: Not very hard  Food Insecurity: No Food Insecurity (11/26/2021)   Hunger Vital Sign    Worried About Running Out of Food in the Last Year: Never true    Ran Out of Food in the Last Year: Never true  Transportation Needs: No Transportation Needs (04/27/2022)   PRAPARE - Hydrologist (Medical): No    Lack of Transportation (Non-Medical): No  Physical Activity: Insufficiently Active (02/04/2022)   Exercise Vital Sign    Days of Exercise per Week: 1 day    Minutes of Exercise per Session: 30 min  Stress: No Stress Concern Present (04/13/2022)   [place]    Feeling of Stress : Not at all  Social Connections: Moderately Integrated (12/29/2021)   Social Connection and Isolation Panel  [NHANES]    Frequency of Communication with Friends and Family: More than three times a week    Frequency of Social Gatherings with Friends and Family: More than three times a week    Attends Religious Services: More than 4 times per year    Active Member of [employer] or Organizations: No    Attends Archivist Meetings: Never    Marital Status: Living with partner  Intimate Partner Violence: Not At Risk (02/04/2022)   Humiliation, Afraid, Rape, and Kick questionnaire    Fear of Current or Ex-Partner: No    Emotionally Abused: No    Physically Abused: No    Sexually Abused: No   Family History  Problem Relation Age of Onset   Colon cancer Mother    Liver disease Sister    Other Neg Hx  Breast cancer Neg Hx    Esophageal cancer Neg Hx    Rectal cancer Neg Hx    Tremor Neg Hx    BP (!) 144/88   Pulse 70   Wt 90.4 kg (199 lb 6.4 oz)   LMP 04/01/2010   SpO2 97%   BMI 34.23 kg/m   Wt Readings from Last 3 Encounters:  06/16/22 90.4 kg (199 lb 6.4 oz)  06/08/22 89.4 kg (197 lb)  06/01/22 89 kg (196 lb 3.2 oz)   PHYSICAL EXAM: General:  NAD. No resp difficulty HEENT: Normal Neck: Supple. No JVD. Carotids 2+ bilat; no bruits. No lymphadenopathy or thryomegaly appreciated. Cor: PMI nondisplaced. Regular rate & rhythm. No rubs, gallops or murmurs. Lungs: Clear Abdomen: Soft, nontender, nondistended. No hepatosplenomegaly. No bruits or masses. Good bowel sounds. Extremities: No cyanosis, clubbing, rash, edema Neuro: Alert & oriented x 3, cranial nerves grossly intact. Moves all 4 extremities w/o difficulty. Affect pleasant.  ASSESSMENT & PLAN: 1. Chronic HFpEF: Echo (3/23) with EF 60-65% Grade II DD.  She has had several HF admissions in the last year.  With diastolic CHF, reasonable to assess for cardiac amyloidosis. PYP scan has been arranged, myeloma panel and urine immunofixation normal. NYHA class II, She is not volume overloaded on exam, weights stable. - Continue  torsemide 40 mg daily. BMET/BNP today.   - Continue spironolactone 12.5 mg daily.  - Continue Jardiance 10 mg daily. No GU symptoms. - Continue Coreg 25 mg bid. - Insurance denied approval for Cardiomems.  2. OSA: Needs CPAP titration.  3. Obesity: Body mass index is 34.23 kg/m. - She is on Ozempic. 4. CKD Stage III: Sees nephrology.   - BMET today.  5. Hypertension: BP elevated today. - Increase hydralazine to 75 mg tid. - Continue amlodipine 2.5 mg daily. 6. Hyperthyroidism: She is now off methimazole and followed by endocrinology.  7. Chest pain: These have decreased in frequency, but still present. She has not completed the cardiac PET, will see if we can arrange this to assess for ischemia.  Follow up in 4 months with Dr. Aundra Dubin, sooner if cardiac PET is ischemic.  [place], FNP 06/16/22

## 2022-06-16 NOTE — Telephone Encounter (Signed)
Called patient per Allena Katz, NP with lab results and following instructions:  1. Kidney function remains elevated from baseline.  2. Cut torsemide back to 40 mg daily alternating with 20 mg every other day.  3. Repeat labs in 2 weeks on 07/01/22 at 11:00  Pt verbalized understanding of same.

## 2022-06-16 NOTE — Patient Instructions (Addendum)
Thank you for coming in today  Labs were done today, if any labs are abnormal the clinic will call you No news is good news  INCREASE Hydralazine  75 mg 1 1/2 tablet 3 times daily   EKG today  Your physician recommends that you schedule a follow-up appointment in:  4 months with Dr. Kendall Flack will receive a reminder letter in the mail a few months in advance. If you don't receive a letter, please call our office to schedule the follow-up appointment.    Do the following things EVERYDAY: Weigh yourself in the morning before breakfast. Write it down and keep it in a log. Take your medicines as prescribed Eat low salt foods--Limit salt (sodium) to 2000 mg per day.  Stay as active as you can everyday Limit all fluids for the day to less than 2 liters  At the [facility], you and your health needs are our priority. As part of our continuing mission to provide you with exceptional heart care, we have created designated Provider Care Teams. These Care Teams include your primary Cardiologist (physician) and Advanced Practice Providers (APPs- Physician Assistants and Nurse Practitioners) who all work together to provide you with the care you need, when you need it.   You may see any of the following providers on your designated Care Team at your next follow up: Dr Glori Bickers Dr Loralie Champagne Dr. Roxana Hires, NP Lyda Jester, [place] [facility] [place], [place] Forestine Na, NP Audry Riles, PharmD   Please be sure to bring in all your medications bottles to every appointment.   If you have any questions or concerns before your next appointment please send [place] a message through [employer] or call our office at [phone].    TO LEAVE A MESSAGE FOR THE NURSE SELECT OPTION 2, PLEASE LEAVE A MESSAGE INCLUDING: YOUR NAME DATE OF BIRTH CALL BACK NUMBER REASON FOR CALL**this is important as we prioritize the call backs  YOU WILL RECEIVE A CALL  BACK THE SAME DAY AS LONG AS YOU CALL BEFORE 4:00 PM

## 2022-06-16 NOTE — Progress Notes (Signed)
Paramedicine Encounter   Patient ID: [name] [name] , female,   DOB: 03/05/1961,61 y.o.,  MRN: [id number]   Met patient in clinic today with provider.  Time spent with patient 35 minutes  Increased Hydralazine dose to 75mg TID.  Reconciled med box x 1 week to reflect change.   [name] Smith, EMT-Paramedic  [phone] 06/16/22    

## 2022-06-17 ENCOUNTER — Other Ambulatory Visit (HOSPITAL_COMMUNITY): Payer: Self-pay

## 2022-06-18 ENCOUNTER — Other Ambulatory Visit: Payer: Medicare Other | Admitting: Obstetrics and Gynecology

## 2022-06-18 NOTE — Patient Instructions (Signed)
Hi Ms. Licciardi, nice speaking with you this morning-have a terrific day and weekend!!  Ms. Fritchman was given information about Medicaid Managed Care team care coordination services as a part of their [place] Medicaid benefit. SABA GOMM verbally consented to engagement with the [facility] Managed Care team.   If you are experiencing a medical emergency, please call 911 or report to your local emergency department or urgent care.   If you have a non-emergency medical problem during routine business hours, please contact your provider's office and ask to speak with a nurse.   For questions related to your [facility], please call: [phone] or visit the homepage here: [email or web address]  If you would like to schedule transportation through your [facility], please call the following number at least 2 days in advance of your appointment: [phone]   Rides for urgent appointments can also be made after hours by calling Member Services.  Call the [place] at [phone], at any time, 24 hours a day, 7 days a week. If you are in danger or need immediate medical attention call 911.  If you would like help to quit smoking, call 1-800-QUIT-NOW 713 628 8742) OR Espaol: 1-855-Djelo-Ya (6-967-893-8101) o para ms informacin haga clic aqu or Text READY to 200-400 to register via text  [name] - following are the goals we discussed in your visit today:   Goals Addressed             This Visit's Progress    Protect My Health       Timeframe:  Long-Range Goal Priority:  Medium Start Date:          10/29/20                   Expected End Date: ongoing             Follow Up Date: 07/22/22   - schedule appointment for flu shot - schedule appointment for vaccines needed due to my age or health - schedule recommended health tests  (blood work, mammogram, colonoscopy, pap test) - schedule and keep appointment for annual check-up   12/1//23:  Patient being followed by Paramedicine weekly. Recent evaluations by PCP, CARDS, ENDO    Why is this important?   Screening tests can find diseases early when they are easier to treat.  Your doctor or nurse will talk with you about which tests are important for you.  Getting shots for common diseases like the flu and shingles will help prevent them.     Patient verbalizes understanding of instructions and care plan provided today and agrees to view in [place]. Active MyChart status and patient understanding of how to access instructions and care plan via MyChart confirmed with patient.     The Managed Medicaid care management team will reach out to the patient again over the next 30 business  days.  The  Patient   has been provided with contact information for the Managed Medicaid care management team and has been advised to call with any health related questions or concerns.   [name] RN, BSN [place] Management Coordinator - Managed Medicaid High Risk [phone]   Following is a copy of your plan of care:  Care Plan : General Plan of Care (Adult)  Updates made by Gayla Medicus, RN since 06/18/2022 12:00 AM     Problem: Quality of Life (General Plan of  Care)   Priority: High  Onset Date: 10/29/2020     Long-Range Goal: Quality of Life Maintained   Start Date: 10/29/2020  Expected End Date: 09/17/2022  Recent Progress: Not on track  Priority: High  Note:    CARE PLAN ENTRY Medicaid Managed Care (see longitudinal plan of care for additional care plan information)  Current Barriers:  Chronic case management needs related to chronic health conditions. 12/1//23:  Patient with no complaints today, continues to be followed by Paramedicine program.  Does not check BP and states blood sugars in 100s-no exact readings.  Most recent  A1C=7.5 on 05/31/22  Nurse Case Manager Clinical Goal(s):  Over the next 30 days, patient will verbalize understanding of plan for decreased swelling.  Over the next 30 days, patient will work with provider to address needs  Over the next 30 days, patient will continue to work with Pharmacist. Over the next 30 days, patient will attend all scheduled appointments.  Interventions:  Inter-disciplinary care team collaboration (see longitudinal plan of care) Reviewed medications with patient. Discussed plans with patient for ongoing care management follow up and provided patient with direct contact information for care management team. Collaboration with Pharmacist for review of medications. Pharmacy referral for medication review. Collaborated with BSW for resources. BSW  Referral for resources-completed Collaborated with PCP for GI referral at patient request Care Guide referral for Medicare information Collaborated with Care Guide.  Plan:  Patient will follow up with provider and fill all prescriptions RNCM will follow up with patient within 30  business days.

## 2022-06-18 NOTE — Patient Outreach (Signed)
Medicaid Managed Care   Nurse Care Manager Note  06/18/2022 Name:  [name] [name] MRN:  [id number] DOB:  1960/10/19  [name] [name] is an 61 y.o. year old female who is a primary patient of [name], MD.  The [facility] Managed Care Coordination team was consulted for assistance with:    Chronic healthcare management needs, DM, HTN, COPD, OSA, CKD, CHF, HLD, hyperthyroid, HLD  [name] was given information about Medicaid Managed Care Coordination team services today. [name] [name] Patient agreed to services and verbal consent obtained.  Engaged with patient by telephone for follow up visit in response to provider referral for case management and/or care coordination services.   Assessments/Interventions:  Review of past medical history, allergies, medications, health status, including review of consultants reports, laboratory and other test data, was performed as part of comprehensive evaluation and provision of chronic care management services.  SDOH (Social Determinants of Health) assessments and interventions performed: SDOH Interventions    Flowsheet Row Patient Outreach Telephone from 04/13/2022 in [place] Patient Outreach Telephone from 02/04/2022 in [place] Patient Outreach Telephone from 12/29/2021 in [place] Patient Outreach Telephone from 11/26/2021 in [place] Patient Outreach Telephone from 10/22/2021 in [employer] Patient Outreach Telephone from 09/21/2021 in [place] Interventions        Food Insecurity Interventions -- -- -- Intervention Not Indicated -- --  Housing Interventions -- -- -- -- Intervention Not Indicated --  Transportation Interventions -- -- -- Intervention Not Indicated -- --  Utilities  Interventions Intervention Not Indicated -- -- -- -- --  Financial Strain Interventions -- -- Intervention Not Indicated -- -- --  Physical Activity Interventions -- Other (Comments)  [patient not physically able to engage in a moderate to strenuous exercise] -- -- -- --  Stress Interventions Intervention Not Indicated -- -- -- -- Intervention Not Indicated     Care Plan  Allergies  Allergen Reactions   Lisinopril Swelling    Medications Reviewed Today     Reviewed by Rafael Bihari, FNP (Family Nurse Practitioner) on 06/16/22 at 1109  Med List Status: <None>   Medication Order Taking? Sig Documenting Provider Last Dose Status Informant  albuterol (VENTOLIN HFA) 108 (90 Base) MCG/ACT inhaler [id number] Yes Inhale 1 puff into the lungs every 4 (four) hours as needed for wheezing or shortness of breath. [name], MD Taking Active Self, Spouse/Significant Other           Med Note [place]   Tue Mar 16, 2022  2:29 PM)    amLODipine (NORVASC) 2.5 MG tablet [id number] Yes Take 1 tablet (2.5 mg total) by mouth daily. [name], MD Taking Active   atorvastatin (LIPITOR) 40 MG tablet [id number] Yes Take 1 tablet (40 mg total) by mouth every morning. [name], MD Taking Active   carvedilol (COREG) 25 MG tablet [id number] Yes TAKE ONE TABLET BY MOUTH twice daily Minus Breeding, MD Taking Active   empagliflozin (JARDIANCE) 10 MG TABS tablet [id number] Yes Take 1 tablet (10 mg total) by mouth daily. [name], MD Taking Active   famotidine (PEPCID) 40 MG tablet [id number] Yes TAKE ONE TABLET BY MOUTH daily [name], MD Taking Active   ferrous sulfate 325 (65 FE) MG tablet [id number] Yes Take 325 mg by mouth daily with breakfast. [provider] Taking Active  hydrALAZINE (APRESOLINE) 50 MG tablet [id number]  Take 1.5 tablets (75 mg total) by mouth 3 (three) times daily. [place], [place]  Active   hydrocortisone-pramoxine [facility]) 2.5-1 %  rectal cream [id number] Yes Place 1 Application rectally 3 (three) times daily. [name], MD Taking Active   methimazole (TAPAZOLE) 5 MG tablet [id number] Yes Take 1 tablet (5 mg total) by mouth daily. Brita Romp, NP Taking Active            Med Note [name], [name]   Tue Jun 08, 2022  3:21 PM) Discontinued per endocrinologist  mometasone-formoterol [facility]) 100-5 MCG/ACT Hollie Salk [id number] Yes Inhale 2 puffs into the lungs as needed for wheezing. [provider] Taking Active   nystatin ointment (MYCOSTATIN) [id number] Yes Apply 1 application. topically 2 (two) times daily as needed (rash/skin irritation). [name], MD Taking Active            Med Note [name], [name]   Tue Apr 20, 2022 11:23 AM)    oxybutynin (DITROPAN) 5 MG tablet [id number] Yes Take 1 tablet (5 mg total) by mouth 2 (two) times daily. [name], MD Taking Active   pregabalin (LYRICA) 75 MG capsule [id number] Yes Take 1 capsule (75 mg total) by mouth 2 (two) times daily. [name], MD Taking Active   Semaglutide, 1 MG/DOSE, 4 MG/3ML SOPN [id number] Yes Inject 1 mg as directed once a week. [name], MD Taking Active   spironolactone (ALDACTONE) 25 MG tablet [id number] Yes TAKE 1/2 TABLET BY MOUTH daily [place], [place] Taking Active   torsemide (DEMADEX) 20 MG tablet [id number] Yes Take 2 tablets (40 mg total) by mouth daily. Larey Dresser, MD Taking Active            Patient Active Problem List   Diagnosis Date Noted   Hyperthyroidism 04/23/2022   Acute on chronic heart failure with preserved ejection fraction (HFpEF) ([place]) 09/22/2021   Heart failure ([place]) 09/22/2021   CKD (chronic kidney disease) stage 4, GFR 15-29 ml/min ([place]) 07/03/2021   Acute respiratory failure due to COVID-19 ([place]) 05/06/2021   COVID-19 05/05/2021   CHF exacerbation ([place]) 04/02/2021   Chest pain 04/02/2021   Acute-on-chronic kidney injury ([place]) 04/02/2021   COPD exacerbation ([place]) 12/05/2020   CKD  (chronic kidney disease), stage III ([place]) 12/04/2020   Anemia of chronic disease 12/04/2020   Obesity, Class III, BMI 40-49.9 (morbid obesity) ([place]) 07/27/2020   Acute respiratory disease due to COVID-19 virus 07/25/2020   Acute hypoxemic respiratory failure due to COVID-19 ([place]) 07/25/2020   Morbid obesity ([place]) 11/23/2019   Acute respiratory failure with hypoxia ([place]) 11/06/2019   Chronic kidney disease, stage 3b ([place]) 11/06/2019   COPD without exacerbation ([place])    Acute kidney failure ([place]) 08/18/2019   Left ventricular hypertrophy 07/21/2019   Educated about COVID-19 virus infection 00/86/7619   Acute diastolic HF (heart failure) (HCC)    Hypoxia    OSA (obstructive sleep apnea) 05/09/2019   Hyperglycemia 04/09/2019   Snoring 03/13/2019   Chronic anemia 08/17/2018   Chronic cystitis 08/03/2018   Diabetic neuropathy ([place]) 06/16/2017   Non compliance w medication regimen 04/12/2017   Family history of colon cancer in mother 11/17/2016   Dyspareunia in female 11/11/2016   Screen for colon cancer 11/11/2016   History of ovarian cyst 11/11/2016   Chronic hepatitis C without hepatic coma ([place]) 11/09/2016   Hyperlipidemia 10/07/2016   Type 2 diabetes mellitus with hyperlipidemia ([place]) 01/29/2014   Status post intraocular  lens implant 11/08/2013   PCO (posterior capsular opacification) 09/28/2013   Pseudophakia 09/12/2013   GERD (gastroesophageal reflux disease) 09/06/2013   Hypertension 09/06/2013   ANEMIA, IRON DEFICIENCY 10/03/2009   LIPOMA 06/18/2009   TINEA PEDIS 05/07/2009   SUBSTANCE ABUSE, MULTIPLE 02/22/2007   HCVD (hypertensive cardiovascular disease) 02/22/2007   Conditions to be addressed/monitored per PCP order:  Chronic healthcare management needs, DM, HTN, COPD, OSA, CKD, CHF, HLD, hyperthyroid, HLD  Care Plan : General Plan of Care (Adult)  Updates made by Gayla Medicus, RN since 06/18/2022 12:00 AM     Problem: Quality of Life (General Plan of Care)    Priority: High  Onset Date: 10/29/2020     Long-Range Goal: Quality of Life Maintained   Start Date: 10/29/2020  Expected End Date: 09/17/2022  Recent Progress: Not on track  Priority: High  Note:    CARE PLAN ENTRY Medicaid Managed Care (see longitudinal plan of care for additional care plan information)  Current Barriers:  Chronic case management needs related to chronic health conditions. 12/1//23:  Patient with no complaints today, continues to be followed by Paramedicine program.  Does not check BP and states blood sugars in 100s-no exact readings.  Most recent A1C=7.5 on 05/31/22  Nurse Case Manager Clinical Goal(s):  Over the next 30 days, patient will verbalize understanding of plan for decreased swelling.  Over the next 30 days, patient will work with provider to address needs  Over the next 30 days, patient will continue to work with Pharmacist. Over the next 30 days, patient will attend all scheduled appointments.  Interventions:  Inter-disciplinary care team collaboration (see longitudinal plan of care) Reviewed medications with patient. Discussed plans with patient for ongoing care management follow up and provided patient with direct contact information for care management team. Collaboration with Pharmacist for review of medications. Pharmacy referral for medication review. Collaborated with BSW for resources. BSW  Referral for resources-completed Collaborated with PCP for GI referral at patient request Care Guide referral for Medicare information Collaborated with Care Guide.  Plan:  Patient will follow up with provider and fill all prescriptions RNCM will follow up with patient within 30  business days.   Follow Up:  Patient agrees to Care Plan and Follow-up.  Plan: The Managed Medicaid care management team will reach out to the patient again over the next 30 business  days. and The  Patient has been provided with contact information for the Managed Medicaid  care management team and has been advised to call with any health related questions or concerns.  Date/time of next scheduled RN care management/care coordination outreach: 07/22/22 at 1230.

## 2022-06-22 ENCOUNTER — Other Ambulatory Visit (HOSPITAL_COMMUNITY): Payer: Self-pay | Admitting: Emergency Medicine

## 2022-06-22 NOTE — Progress Notes (Signed)
Paramedicine Encounter    Patient ID: [name] [name], female    DOB: 18-Mar-1961, 61 y.o.   MRN: [id number]   BP 120/78 (BP Location: Left Arm, Patient Position: Sitting)   Pulse 73   Resp 16   Wt 197 lb 6.4 oz (89.5 kg)   LMP 04/01/2010   SpO2 96%   BMI 33.88 kg/m  Weight yesterday-not taken Last visit weight-197lb  ATF Mr. Calais A&O x 4, skin W&D w/ good color.  Pt reports to be feeling well.  She denies chest pain or SOB.  Lung sounds clear and equal bilat and no edema noted.  Med box reconciled x 1 week.  Refills called in for Spiro, Oxybutin, Famotidine.  She has not had her Semiglutide for the past 2 weeks.  [facility] advises the med is on backorder.  I was able to call around for her today and [facility] had it available.  I contacted Ms. Breiner  to let her know it was available and she states she can get by the pharmacy later this afternoon.   Pending appointments reviewed for 12/14 @ the [facility] for labs @ 11:00 Home visit complete.    [name], [place] 06/22/2022   Patient Care Team: [name], MD as PCP - General (Family Medicine) Minus Breeding, MD as PCP - Cardiology (Cardiology) Craft, Lorel Monaco, RN as Case Manager [place], Tami Lin, [place] as Physician Assistant (Cardiology) Ethelda Chick as Social Worker  Patient Active Problem List   Diagnosis Date Noted   Hyperthyroidism 04/23/2022   Acute on chronic heart failure with preserved ejection fraction (HFpEF) ([place]) 09/22/2021   Heart failure ([place]) 09/22/2021   CKD (chronic kidney disease) stage 4, GFR 15-29 ml/min ([place]) 07/03/2021   Acute respiratory failure due to COVID-19 ([place]) 05/06/2021   COVID-19 05/05/2021   CHF exacerbation ([place]) 04/02/2021   Chest pain 04/02/2021   Acute-on-chronic kidney injury ([place]) 04/02/2021   COPD exacerbation ([place]) 12/05/2020   CKD (chronic kidney disease), stage III ([place]) 12/04/2020   Anemia of chronic disease 12/04/2020   Obesity,  Class III, BMI 40-49.9 (morbid obesity) ([place]) 07/27/2020   Acute respiratory disease due to COVID-19 virus 07/25/2020   Acute hypoxemic respiratory failure due to COVID-19 ([place]) 07/25/2020   Morbid obesity ([place]) 11/23/2019   Acute respiratory failure with hypoxia ([place]) 11/06/2019   Chronic kidney disease, stage 3b ([place]) 11/06/2019   COPD without exacerbation ([place])    Acute kidney failure ([place]) 08/18/2019   Left ventricular hypertrophy 07/21/2019   Educated about COVID-19 virus infection 65/09/5463   Acute diastolic HF (heart failure) (HCC)    Hypoxia    OSA (obstructive sleep apnea) 05/09/2019   Hyperglycemia 04/09/2019   Snoring 03/13/2019   Chronic anemia 08/17/2018   Chronic cystitis 08/03/2018   Diabetic neuropathy ([place]) 06/16/2017   Non compliance w medication regimen 04/12/2017   Family history of colon cancer in mother 11/17/2016   Dyspareunia in female 11/11/2016   Screen for colon cancer 11/11/2016   History of ovarian cyst 11/11/2016   Chronic hepatitis C without hepatic coma ([place]) 11/09/2016   Hyperlipidemia 10/07/2016   Type 2 diabetes mellitus with hyperlipidemia ([place]) 01/29/2014   Status post intraocular lens implant 11/08/2013   PCO (posterior capsular opacification) 09/28/2013   Pseudophakia 09/12/2013   GERD (gastroesophageal reflux disease) 09/06/2013   Hypertension 09/06/2013   ANEMIA, IRON DEFICIENCY 10/03/2009   LIPOMA 06/18/2009   TINEA PEDIS 05/07/2009   SUBSTANCE ABUSE, MULTIPLE 02/22/2007   HCVD (  hypertensive cardiovascular disease) 02/22/2007    Current Outpatient Medications:    albuterol (VENTOLIN HFA) 108 (90 Base) MCG/ACT inhaler, Inhale 1 puff into the lungs every 4 (four) hours as needed for wheezing or shortness of breath., Disp: 18 g, Rfl: 0   amLODipine (NORVASC) 2.5 MG tablet, Take 1 tablet (2.5 mg total) by mouth daily., Disp: 90 tablet, Rfl: 1   atorvastatin (LIPITOR) 40 MG tablet, Take 1 tablet (40 mg total) by mouth every morning.,  Disp: 90 tablet, Rfl: 1   carvedilol (COREG) 25 MG tablet, TAKE ONE TABLET BY MOUTH twice daily, Disp: 180 tablet, Rfl: 3   empagliflozin (JARDIANCE) 10 MG TABS tablet, Take 1 tablet (10 mg total) by mouth daily., Disp: 90 tablet, Rfl: 1   famotidine (PEPCID) 40 MG tablet, TAKE ONE TABLET BY MOUTH daily, Disp: 30 tablet, Rfl: 3   ferrous sulfate 325 (65 FE) MG tablet, Take 325 mg by mouth daily with breakfast., Disp: , Rfl:    hydrALAZINE (APRESOLINE) 50 MG tablet, Take 1.5 tablets (75 mg total) by mouth 3 (three) times daily., Disp: 135 tablet, Rfl: 8   mometasone-formoterol (DULERA) 100-5 MCG/ACT AERO, Inhale 2 puffs into the lungs as needed for wheezing., Disp: , Rfl:    oxybutynin (DITROPAN) 5 MG tablet, Take 1 tablet (5 mg total) by mouth 2 (two) times daily., Disp: 60 tablet, Rfl: 6   pregabalin (LYRICA) 75 MG capsule, Take 1 capsule (75 mg total) by mouth 2 (two) times daily., Disp: 180 capsule, Rfl: 1   spironolactone (ALDACTONE) 25 MG tablet, TAKE 1/2 TABLET BY MOUTH daily, Disp: 30 tablet, Rfl: 3   torsemide (DEMADEX) 20 MG tablet, Take 20 mg alternating with 40 mg daily, Disp: 90 tablet, Rfl: 6   hydrocortisone-pramoxine (ANALPRAM HC) 2.5-1 % rectal cream, Place 1 Application rectally 3 (three) times daily. (Patient not taking: Reported on 06/22/2022), Disp: 30 g, Rfl: 1   methimazole (TAPAZOLE) 5 MG tablet, Take 1 tablet (5 mg total) by mouth daily. (Patient not taking: Reported on 06/22/2022), Disp: 90 tablet, Rfl: 1   nystatin ointment (MYCOSTATIN), Apply 1 application. topically 2 (two) times daily as needed (rash/skin irritation). (Patient not taking: Reported on 06/22/2022), Disp: 30 g, Rfl: 1   Semaglutide, 1 MG/DOSE, 4 MG/3ML SOPN, Inject 1 mg as directed once a week., Disp: 3 mL, Rfl: 6 Allergies  Allergen Reactions   Lisinopril Swelling      Social History   Socioeconomic History   Marital status: Legally Separated    Spouse name: Not on file   Number of children: 1    Years of education: Not on file   Highest education level: 9th grade  Occupational History   Occupation: disability  Tobacco Use   Smoking status: Never   Smokeless tobacco: Never  Vaping Use   Vaping Use: Never used  Substance and Sexual Activity   Alcohol use: No   Drug use: Not Currently    Types: Cocaine, Marijuana    Comment: remote h/o cocaine 2015 and marijuana 2018   Sexual activity: Not Currently  Other Topics Concern   Not on file  Social History Narrative   Lives with friend, Rosalee Kaufman and nephew.  One daughter.    Social Determinants of Health   Financial Resource Strain: Low Risk  (12/29/2021)   Overall Financial Resource Strain (CARDIA)    Difficulty of Paying Living Expenses: Not very hard  Food Insecurity: No Food Insecurity (11/26/2021)   Hunger Vital Sign    Worried  About Running Out of Food in the Last Year: Never true    Ran Out of Food in the Last Year: Never true  Transportation Needs: No Transportation Needs (04/27/2022)   PRAPARE - Hydrologist (Medical): No    Lack of Transportation (Non-Medical): No  Physical Activity: Insufficiently Active (02/04/2022)   Exercise Vital Sign    Days of Exercise per Week: 1 day    Minutes of Exercise per Session: 30 min  Stress: No Stress Concern Present (04/13/2022)   [place]    Feeling of Stress : Not at all  Social Connections: Moderately Integrated (12/29/2021)   Social Connection and Isolation Panel [NHANES]    Frequency of Communication with Friends and Family: More than three times a week    Frequency of Social Gatherings with Friends and Family: More than three times a week    Attends Religious Services: More than 4 times per year    Active Member of [employer] or Organizations: No    Attends Archivist Meetings: Never    Marital Status: Living with partner  Intimate Partner Violence: Not At Risk (02/04/2022)    Humiliation, Afraid, Rape, and Kick questionnaire    Fear of Current or Ex-Partner: No    Emotionally Abused: No    Physically Abused: No    Sexually Abused: No    Physical Exam      Future Appointments  Date Time Provider [place]  07/01/2022 11:00 AM [facility] LAB [facility] None  07/02/2022 10:30 AM [facility] COVERING PROVIDER 2 [facility] None  07/05/2022  2:30 PM Brita Romp, NP [facility] None  07/22/2022 12:30 PM Craft, Lorel Monaco, RN [place] None  09/06/2022 10:10 AM [name], MD [facility] None       [name], EMT-P-Paramedic [place] Paramedic  06/22/22

## 2022-06-24 ENCOUNTER — Other Ambulatory Visit: Payer: Self-pay | Admitting: Family Medicine

## 2022-06-24 NOTE — Telephone Encounter (Signed)
Unable to refill per protocol, Rx request is too soon, will refuse duplicate request.  Requested Prescriptions  Pending Prescriptions Disp Refills   famotidine (PEPCID) 40 MG tablet [Pharmacy Med Name: famotidine 40 mg tablet] 30 tablet 3    Sig: TAKE ONE TABLET BY MOUTH daily     There is no refill protocol information for this order

## 2022-06-25 ENCOUNTER — Other Ambulatory Visit: Payer: Self-pay | Admitting: Family Medicine

## 2022-06-29 ENCOUNTER — Other Ambulatory Visit (HOSPITAL_COMMUNITY): Payer: Self-pay | Admitting: Emergency Medicine

## 2022-06-29 NOTE — Progress Notes (Signed)
Pt called and cancelled today's appointment while I was enroute to same.  Rescheduled for 12/13 @ 2:00/    [name], [place] 06/29/2022

## 2022-06-30 ENCOUNTER — Other Ambulatory Visit (HOSPITAL_COMMUNITY): Payer: Self-pay | Admitting: Emergency Medicine

## 2022-06-30 NOTE — Progress Notes (Signed)
Paramedicine Encounter    Patient ID: [name] [name], female    DOB: 1960-11-02, 61 y.o.   MRN: [id number]   BP (!) 138/90 (BP Location: Left Arm, Patient Position: Sitting, Cuff Size: Normal)   Pulse 77   Resp 16   Wt 195 lb 9.6 oz (88.7 kg)   LMP 04/01/2010   SpO2 95%   BMI 33.57 kg/m  Weight yesterday-not taken Last visit weight-197lb  Today's visit was rescheduled from yesterday to today.  She advises that she went out of town for the weekend and got off track w/ her medications x 4 days.  She reconciled her own pill box w/ the help of her daughter and I reviewed for accuracy and everything looked good except she put Methimazole back in her pill box but it has been discontinued by her Endocrinologist so I removed those for her.   Home visit complete.    [name], [place] 06/30/2022   Patient Care Team: [name], MD as PCP - General (Family Medicine) Minus Breeding, MD as PCP - Cardiology (Cardiology) Craft, Lorel Monaco, RN as Case Manager [place], Tami Lin, [place] as Physician Assistant (Cardiology) Ethelda Chick as Social Worker  Patient Active Problem List   Diagnosis Date Noted   Hyperthyroidism 04/23/2022   Acute on chronic heart failure with preserved ejection fraction (HFpEF) ([place]) 09/22/2021   Heart failure ([place]) 09/22/2021   CKD (chronic kidney disease) stage 4, GFR 15-29 ml/min ([place]) 07/03/2021   Acute respiratory failure due to COVID-19 ([place]) 05/06/2021   COVID-19 05/05/2021   CHF exacerbation ([place]) 04/02/2021   Chest pain 04/02/2021   Acute-on-chronic kidney injury ([place]) 04/02/2021   COPD exacerbation ([place]) 12/05/2020   CKD (chronic kidney disease), stage III ([place]) 12/04/2020   Anemia of chronic disease 12/04/2020   Obesity, Class III, BMI 40-49.9 (morbid obesity) ([place]) 07/27/2020   Acute respiratory disease due to COVID-19 virus 07/25/2020   Acute hypoxemic respiratory failure due to COVID-19 ([place]) 07/25/2020   Morbid  obesity ([place]) 11/23/2019   Acute respiratory failure with hypoxia ([place]) 11/06/2019   Chronic kidney disease, stage 3b ([place]) 11/06/2019   COPD without exacerbation ([place])    Acute kidney failure ([place]) 08/18/2019   Left ventricular hypertrophy 07/21/2019   Educated about COVID-19 virus infection 25/63/8937   Acute diastolic HF (heart failure) (HCC)    Hypoxia    OSA (obstructive sleep apnea) 05/09/2019   Hyperglycemia 04/09/2019   Snoring 03/13/2019   Chronic anemia 08/17/2018   Chronic cystitis 08/03/2018   Diabetic neuropathy ([place]) 06/16/2017   Non compliance w medication regimen 04/12/2017   Family history of colon cancer in mother 11/17/2016   Dyspareunia in female 11/11/2016   Screen for colon cancer 11/11/2016   History of ovarian cyst 11/11/2016   Chronic hepatitis C without hepatic coma ([place]) 11/09/2016   Hyperlipidemia 10/07/2016   Type 2 diabetes mellitus with hyperlipidemia ([place]) 01/29/2014   Status post intraocular lens implant 11/08/2013   PCO (posterior capsular opacification) 09/28/2013   Pseudophakia 09/12/2013   GERD (gastroesophageal reflux disease) 09/06/2013   Hypertension 09/06/2013   ANEMIA, IRON DEFICIENCY 10/03/2009   LIPOMA 06/18/2009   TINEA PEDIS 05/07/2009   SUBSTANCE ABUSE, MULTIPLE 02/22/2007   HCVD (hypertensive cardiovascular disease) 02/22/2007    Current Outpatient Medications:    albuterol (VENTOLIN HFA) 108 (90 Base) MCG/ACT inhaler, Inhale 1 puff into the lungs every 4 (four) hours as needed for wheezing or shortness of breath., Disp: 18 g, Rfl: 0   amLODipine (  NORVASC) 2.5 MG tablet, Take 1 tablet (2.5 mg total) by mouth daily., Disp: 90 tablet, Rfl: 1   atorvastatin (LIPITOR) 40 MG tablet, Take 1 tablet (40 mg total) by mouth every morning., Disp: 90 tablet, Rfl: 1   carvedilol (COREG) 25 MG tablet, TAKE ONE TABLET BY MOUTH twice daily, Disp: 180 tablet, Rfl: 3   empagliflozin (JARDIANCE) 10 MG TABS tablet, Take 1 tablet (10 mg total) by  mouth daily., Disp: 90 tablet, Rfl: 1   famotidine (PEPCID) 40 MG tablet, TAKE ONE TABLET BY MOUTH daily, Disp: 30 tablet, Rfl: 3   ferrous sulfate 325 (65 FE) MG tablet, Take 325 mg by mouth daily with breakfast., Disp: , Rfl:    hydrALAZINE (APRESOLINE) 50 MG tablet, Take 1.5 tablets (75 mg total) by mouth 3 (three) times daily., Disp: 135 tablet, Rfl: 8   mometasone-formoterol (DULERA) 100-5 MCG/ACT AERO, Inhale 2 puffs into the lungs as needed for wheezing., Disp: , Rfl:    oxybutynin (DITROPAN) 5 MG tablet, Take 1 tablet (5 mg total) by mouth 2 (two) times daily., Disp: 60 tablet, Rfl: 6   pregabalin (LYRICA) 75 MG capsule, Take 1 capsule (75 mg total) by mouth 2 (two) times daily., Disp: 180 capsule, Rfl: 1   Semaglutide, 1 MG/DOSE, 4 MG/3ML SOPN, Inject 1 mg as directed once a week., Disp: 3 mL, Rfl: 6   spironolactone (ALDACTONE) 25 MG tablet, TAKE 1/2 TABLET BY MOUTH daily, Disp: 30 tablet, Rfl: 3   torsemide (DEMADEX) 20 MG tablet, Take 20 mg alternating with 40 mg daily, Disp: 90 tablet, Rfl: 6   hydrocortisone-pramoxine (ANALPRAM HC) 2.5-1 % rectal cream, Place 1 Application rectally 3 (three) times daily. (Patient not taking: Reported on 06/22/2022), Disp: 30 g, Rfl: 1   methimazole (TAPAZOLE) 5 MG tablet, Take 1 tablet (5 mg total) by mouth daily. (Patient not taking: Reported on 06/22/2022), Disp: 90 tablet, Rfl: 1   nystatin ointment (MYCOSTATIN), Apply 1 application. topically 2 (two) times daily as needed (rash/skin irritation). (Patient not taking: Reported on 06/22/2022), Disp: 30 g, Rfl: 1 Allergies  Allergen Reactions   Lisinopril Swelling      Social History   Socioeconomic History   Marital status: Legally Separated    Spouse name: Not on file   Number of children: 1   Years of education: Not on file   Highest education level: 9th grade  Occupational History   Occupation: disability  Tobacco Use   Smoking status: Never   Smokeless tobacco: Never  Vaping Use    Vaping Use: Never used  Substance and Sexual Activity   Alcohol use: No   Drug use: Not Currently    Types: Cocaine, Marijuana    Comment: remote h/o cocaine 2015 and marijuana 2018   Sexual activity: Not Currently  Other Topics Concern   Not on file  Social History Narrative   Lives with friend, Rosalee Kaufman and nephew.  One daughter.    Social Determinants of Health   Financial Resource Strain: Low Risk  (12/29/2021)   Overall Financial Resource Strain (CARDIA)    Difficulty of Paying Living Expenses: Not very hard  Food Insecurity: No Food Insecurity (11/26/2021)   Hunger Vital Sign    Worried About Running Out of Food in the Last Year: Never true    Ran Out of Food in the Last Year: Never true  Transportation Needs: No Transportation Needs (04/27/2022)   PRAPARE - Hydrologist (Medical): No  Lack of Transportation (Non-Medical): No  Physical Activity: Insufficiently Active (02/04/2022)   Exercise Vital Sign    Days of Exercise per Week: 1 day    Minutes of Exercise per Session: 30 min  Stress: No Stress Concern Present (04/13/2022)   [place]    Feeling of Stress : Not at all  Social Connections: Moderately Integrated (12/29/2021)   Social Connection and Isolation Panel [NHANES]    Frequency of Communication with Friends and Family: More than three times a week    Frequency of Social Gatherings with Friends and Family: More than three times a week    Attends Religious Services: More than 4 times per year    Active Member of [employer] or Organizations: No    Attends Archivist Meetings: Never    Marital Status: Living with partner  Intimate Partner Violence: Not At Risk (02/04/2022)   Humiliation, Afraid, Rape, and Kick questionnaire    Fear of Current or Ex-Partner: No    Emotionally Abused: No    Physically Abused: No    Sexually Abused: No    Physical  Exam      Future Appointments  Date Time Provider [place]  07/01/2022 11:00 AM [facility] LAB [facility] None  07/02/2022 10:30 AM [facility] COVERING PROVIDER 2 [facility] None  07/05/2022  2:30 PM Brita Romp, NP [facility] None  07/22/2022 12:30 PM Craft, Lorel Monaco, RN [place] None  09/06/2022 10:10 AM [name], MD [facility] None       [name], EMT-P-Paramedic [place] Par[facility]ic  06/30/22

## 2022-07-01 ENCOUNTER — Telehealth: Payer: Self-pay | Admitting: Nurse Practitioner

## 2022-07-01 ENCOUNTER — Ambulatory Visit (HOSPITAL_COMMUNITY)
Admission: RE | Admit: 2022-07-01 | Discharge: 2022-07-01 | Disposition: A | Discharge status: Home / Self Care | Payer: Medicare Other | Source: Ambulatory Visit | Attending: Cardiology | Admitting: Cardiology

## 2022-07-01 DIAGNOSIS — I5032 Chronic diastolic (congestive) heart failure: Secondary | ICD-10-CM | POA: Diagnosis present

## 2022-07-01 LAB — BASIC METABOLIC PANEL
Anion gap: 10 (ref 5–15)
BUN: 43 mg/dL — ABNORMAL HIGH (ref 8–23)
CO2: 22 mmol/L (ref 22–32)
Calcium: 9.1 mg/dL (ref 8.9–10.3)
Chloride: 111 mmol/L (ref 98–111)
Creatinine, Ser: 2.09 mg/dL — ABNORMAL HIGH (ref 0.44–1.00)
GFR, Estimated: 26 mL/min — ABNORMAL LOW (ref >60)
Glucose, Bld: 176 mg/dL — ABNORMAL HIGH (ref 70–99)
Potassium: 4 mmol/L (ref 3.5–5.1)
Sodium: 143 mmol/L (ref 135–145)

## 2022-07-01 NOTE — Telephone Encounter (Signed)
Thank you :)

## 2022-07-01 NOTE — Telephone Encounter (Signed)
I did not see where this had been sent for scheduling. Sent this today to be scheduled at [facility].

## 2022-07-01 NOTE — Telephone Encounter (Signed)
Loree Fee is asking about this pt's uptake and scan. I do not see it in the workque anymore and I called her and she did not answer. She has an appt with [place] on Monday. Whitney said if she is not going to do it then she needs to push her appt out

## 2022-07-02 ENCOUNTER — Ambulatory Visit: Payer: Medicare Other | Attending: Family Medicine

## 2022-07-02 DIAGNOSIS — Z0000 Encounter for general adult medical examination without abnormal findings: Secondary | ICD-10-CM | POA: Diagnosis not present

## 2022-07-02 MED ORDER — ZOSTER VAC RECOMB ADJUVANTED 50 MCG/0.5ML IM SUSR: 0.5000 mL | Freq: Once | INTRAMUSCULAR | 0 refills | Status: AC

## 2022-07-02 NOTE — Progress Notes (Signed)
Subjective:   [name] [name] is a 61 y.o. female who presents for Medicare Annual (Subsequent) preventive examination.  Review of Systems    connected with [name] [name] on 07/02/22 at  10:30 am by telephone and verified that I am speaking with the correct person using two identifiers. I discussed the limitations, risks, security and privacy concerns of performing an evaluation and management service by telephone and the availability of in person appointments. I also discussed with the patient that there may be a patient responsible charge related to this service. The patient expressed understanding and agreed to proceed.  Patient location:  Home  My Location: [facility]  Persons on the telephone call:   Myself Bethann Berkshire [place]) and [name] [name]       Objective:    There were no vitals filed for this visit. There is no height or weight on file to calculate BMI.     09/25/2021    8:11 AM 09/25/2021    8:00 AM 09/15/2021   10:55 PM 09/15/2021   12:42 PM 09/07/2021    2:54 PM 08/18/2021    9:12 PM 05/05/2021   11:28 PM  Advanced Directives  Does Patient Have a Medical Advance Directive?  _0    Would patient like information on creating a medical advance directive? No - Patient declined  No - Patient declined  No - Patient declined No - Patient declined No - Patient declined    Current Medications (verified) Outpatient Encounter Medications as of 07/02/2022  Medication Sig   albuterol (VENTOLIN HFA) 108 (90 Base) MCG/ACT inhaler Inhale 1 puff into the lungs every 4 (four) hours as needed for wheezing or shortness of breath.   amLODipine (NORVASC) 2.5 MG tablet Take 1 tablet (2.5 mg total) by mouth daily.   atorvastatin (LIPITOR) 40 MG tablet Take 1 tablet (40 mg total) by mouth every morning.   carvedilol (COREG) 25 MG tablet TAKE ONE TABLET BY MOUTH twice daily   empagliflozin (JARDIANCE) 10 MG TABS tablet Take 1 tablet (10 mg total) by mouth daily.    famotidine (PEPCID) 40 MG tablet TAKE ONE TABLET BY MOUTH daily   ferrous sulfate 325 (65 FE) MG tablet Take 325 mg by mouth daily with breakfast.   hydrALAZINE (APRESOLINE) 50 MG tablet Take 1.5 tablets (75 mg total) by mouth 3 (three) times daily.   methimazole (TAPAZOLE) 5 MG tablet Take 1 tablet (5 mg total) by mouth daily.   oxybutynin (DITROPAN) 5 MG tablet Take 1 tablet (5 mg total) by mouth 2 (two) times daily.   pregabalin (LYRICA) 75 MG capsule Take 1 capsule (75 mg total) by mouth 2 (two) times daily.   Semaglutide, 1 MG/DOSE, 4 MG/3ML SOPN Inject 1 mg as directed once a week.   spironolactone (ALDACTONE) 25 MG tablet TAKE 1/2 TABLET BY MOUTH daily   torsemide (DEMADEX) 20 MG tablet Take 20 mg alternating with 40 mg daily   hydrocortisone-pramoxine (ANALPRAM HC) 2.5-1 % rectal cream Place 1 Application rectally 3 (three) times daily. (Patient not taking: Reported on 06/22/2022)   mometasone-formoterol (DULERA) 100-5 MCG/ACT AERO Inhale 2 puffs into the lungs as needed for wheezing.   nystatin ointment (MYCOSTATIN) Apply 1 application. topically 2 (two) times daily as needed (rash/skin irritation). (Patient not taking: Reported on 06/22/2022)   No facility-administered encounter medications on file as of 07/02/2022.    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   Qualifier: Diagnosis of  By: Jorene Minors, [place]     Hypertension    MRSA (methicillin resistant Staphylococcus aureus)    Trichomonas    VAGINITIS, BACTERIAL, RECURRENT 08/15/2007   Qualifier: Diagnosis of  By: Radene Ou MD, [place]     Past Surgical History:  Procedure Laterality Date   BREAST BIOPSY Left 2018   BREAST BIOPSY Left 06/08/2022   BREAST BIOPSY Right 06/08/2022   BREAST BIOPSY Right 06/08/2022   [place] RT  BREAST BX W LOC DEV 1ST LESION IMG BX SPEC US GUIDE 06/08/2022 [facility]   BREAST BIOPSY Left 06/08/2022   [place] LT BREAST BX W LOC DEV 1ST LESION IMG BX SPEC US GUIDE 06/08/2022 [facility]   CESAREAN SECTION     breech   Family History  Problem Relation Age of Onset   Colon cancer Mother    Liver disease Sister    Other Neg Hx    Breast cancer Neg Hx    Esophageal cancer Neg Hx    Rectal cancer Neg Hx    Tremor Neg Hx    Social History   Socioeconomic History   Marital status: Legally Separated    Spouse name: Not on file   Number of children: 1   Years of education: Not on file   Highest education level: 9th grade  Occupational History   Occupation: disability  Tobacco Use   Smoking status: Never   Smokeless tobacco: Never  Vaping Use   Vaping Use: Never used  Substance and Sexual Activity   Alcohol use: No   Drug use: Not Currently    Types: Cocaine, Marijuana    Comment: remote h/o cocaine 2015 and marijuana 2018   Sexual activity: Not Currently  Other Topics Concern   Not on file  Social History Narrative   Lives with friend, Rosalee Kaufman and nephew.  One daughter.    Social Determinants of Health   Financial Resource Strain: Low Risk  (12/29/2021)   Overall Financial Resource Strain (CARDIA)    Difficulty of Paying Living Expenses: Not very hard  Food Insecurity: No Food Insecurity (11/26/2021)   Hunger Vital Sign    Worried About Running Out of Food in the Last Year: Never true    Ran Out of Food in the Last Year: Never true  Transportation Needs: No Transportation Needs (04/27/2022)   PRAPARE - Hydrologist (Medical): No    Lack of Transportation (Non-Medical): No  Physical Activity: Insufficiently Active (02/04/2022)   Exercise Vital Sign    Days of Exercise per Week: 1 day    Minutes of Exercise per Session: 30 min  Stress: No Stress Concern Present (04/13/2022)   [place]    Feeling of Stress : Not at all  Social Connections: Moderately Integrated (12/29/2021)   Social Connection and Isolation Panel [NHANES]    Frequency of Communication with Friends and Family: More than three times a week    Frequency of Social Gatherings with Friends and Family: More than three times a week    Attends Religious Services: More than 4 times per year    Active Member of [employer] or Organizations: No    Attends Archivist Meetings: Never    Marital Status: Living with partner  Tobacco Counseling Counseling given: Not Answered   Clinical Intake:     Pain : No/denies pain     Diabetes: Yes CBG done?: No     Diabetic?yes         Activities of Daily Living    09/25/2021    8:00 AM 09/25/2021    7:17 AM  In your present state of health, do you have any difficulty performing the following activities:  Hearing? 0   Vision? 0   Difficulty concentrating or making decisions? 0   Walking or climbing stairs? 0   Dressing or bathing? 0   Doing errands, shopping?  0    Patient Care Team: [name], MD as PCP - General (Family Medicine) Minus Breeding, MD as PCP - Cardiology (Cardiology) Craft, Lorel Monaco, RN as Case Manager [place], Tami Lin, [place] as Physician Assistant (Cardiology) Ethelda Chick as Social Worker  Indicate any recent [place] you may have received from other than [facility] providers in the past year (date may be approximate).     Assessment:   This is a routine wellness examination for [name] [name].  Hearing/Vision screen No results found.  Dietary issues and exercise activities discussed:     Goals Addressed   None   Depression Screen    02/10/2022    9:41 AM 11/02/2021   10:14 AM 10/01/2021    2:06 PM 08/25/2021    4:15 PM 04/21/2021   11:19 AM 04/01/2021   11:01 AM 04/01/2021   10:38 AM  PHQ 2/9 Scores  PHQ - 2 Score 0 0 1 4 0 0 0  PHQ- 9 Score 0 _0 Fall Risk     07/02/2022   10:39 AM 05/31/2022   11:27 AM 02/10/2022    9:36 AM 11/02/2021   10:14 AM 10/01/2021    2:06 PM  [place] in the past year? 0 0 0 0 0  Number falls in past yr: 0 0 0 0 0  Injury with Fall? 0 0 0 0 0  Risk for fall due to : No Fall Risks No Fall Risks  No Fall Risks No Fall Risks    FALL RISK PREVENTION PERTAINING TO THE HOME:  Any stairs in or around the home? Yes  If so, are there any without handrails? No  Home free of loose throw rugs in walkways, pet beds, electrical cords, etc? No  Adequate lighting in your home to reduce risk of falls? No   ASSISTIVE DEVICES UTILIZED TO PREVENT FALLS:  Life alert? No  Use of a cane, walker or w/c? Yes  Grab bars in the bathroom? No  Shower chair or bench in shower? No  Elevated toilet seat or a handicapped toilet? No   TIMED UP AND GO:  Was the test performed? No .  Length of time to ambulate 10 feet:  sec.   Gait slow and steady with assistive device  Cognitive Function:    07/02/2022   10:39 AM  MMSE - Mini Mental State Exam  Orientation to time 5  Orientation to Place 5  Registration 3  Attention/ Calculation 5  Recall 3  Language- name 2 objects 2  Language- repeat 1  Language- follow 3 step command 3  Language- read & follow direction 1  Write a sentence 1  Copy design 1  Total score 30        07/02/2022   10:42 AM  6CIT Screen  What Year? 0 points  What month? 0 points  What time? 0 points  Count back from 20 0 points  Months in reverse 0 points    Immunizations Immunization History  Administered Date(s) Administered   Fluad Quad(high Dose 65+) 04/12/2019   Influenza Whole 04/18/2006, 06/13/2007, 06/29/2010   Influenza,inj,Quad PF,6+ Mos 05/13/2014, 06/03/2020, 04/01/2021   PNEUMOCOCCAL CONJUGATE-20 04/01/2021   Pneumococcal Polysaccharide-23 01/22/2005, 11/18/2016   Td 01/22/2005    TDAP status : will complete at next visit   Flu Vaccine status: Due, Education has been  provided regarding the importance of this vaccine. Advised may receive this vaccine at local pharmacy or Health Dept. Aware to provide a copy of the vaccination record if obtained from local pharmacy or Health Dept. Verbalized acceptance and understanding.  Pneumococcal vaccine status: Up to date  Covid-19 vaccine status: patient stated that she complete vaccines, told her to bring card in next visit  Qualifies for Shingles Vaccine? Yes   Zostavax completed Yes   Shingrix Completed?: No.    Education has been provided regarding the importance of this vaccine. Patient has been advised to call insurance company to determine out of pocket expense if they have not yet received this vaccine. Advised may also receive vaccine at local pharmacy or Health Dept. Verbalized acceptance and understanding.  Screening Tests Health Maintenance  Topic Date Due   COVID-19 Vaccine (1) Never done   Zoster Vaccines- Shingrix (1 of 2) Never done   DTaP/Tdap/Td (2 - Tdap) 01/23/2015   Diabetic kidney evaluation - Urine ACR  06/03/2021   INFLUENZA VACCINE  02/16/2022   OPHTHALMOLOGY EXAM  10/27/2022   FOOT EXAM  11/03/2022   HEMOGLOBIN A1C  11/29/2022   Diabetic kidney evaluation - eGFR measurement  07/02/2023   Medicare Annual Wellness (AWV)  07/03/2023   COLONOSCOPY (Pts 45-35yr Insurance coverage will need to be confirmed)  10/03/2023   PAP SMEAR-Modifier  04/01/2024   MAMMOGRAM  06/02/2024   Hepatitis C Screening  Completed   HIV Screening  Completed   HPV VACCINES  Aged Out    Health Maintenance  Health Maintenance Due  Topic Date Due   COVID-19 Vaccine (1) Never done   Zoster Vaccines- Shingrix (1 of 2) Never done   DTaP/Tdap/Td (2 - Tdap) 01/23/2015   Diabetic kidney evaluation - Urine ACR  06/03/2021   INFLUENZA VACCINE  02/16/2022    Colorectal cancer screening: Type of screening: Colonoscopy. Completed 10/03/2018. Repeat every 5 years  Mammogram status: Completed 06/02/22. Repeat every  year  Bone density status: patient is not of age   Lung Cancer Screening: (Low Dose CT Chest recommended if Age 61-80years, 30 pack-year currently smoking OR have quit w/in 15years.) does not qualify.   Lung Cancer Screening Referral:   Additional Screening:  Hepatitis C Screening: does qualify; Completed 02/09/2017  Vision Screening: Recommended annual ophthalmology exams for early detection of glaucoma and other disorders of the eye. Is the patient up to date with their annual eye exam?  Yes  Who is the provider or what is the name of the office in which the patient attends annual eye exams?  If pt is not established with a provider, would they like to be referred to a provider to establish care? No .   Dental Screening: Recommended annual dental exams for proper oral hygiene  Community Resource Referral / Chronic Care Management: CRR required this visit?  No   CCM required this visit?  No  Plan:     I have personally reviewed and noted the following in the patient's chart:   Medical and social history Use of alcohol, tobacco or illicit drugs  Current medications and supplements including opioid prescriptions. Patient is not currently taking opioid prescriptions. Functional ability and status Nutritional status Physical activity Advanced directives List of other physicians Hospitalizations, surgeries, and ER visits in previous 12 months Vitals Screenings to include cognitive, depression, and falls Referrals and appointments  In addition, I have reviewed and discussed with patient certain preventive protocols, quality metrics, and best practice recommendations. A written personalized care plan for preventive services as well as general preventive health recommendations were provided to patient.     Lillie [place], [place]   07/02/2022   Nurse Notes:

## 2022-07-05 ENCOUNTER — Telehealth: Payer: Medicare Other | Admitting: Nurse Practitioner

## 2022-07-05 DIAGNOSIS — E0590 Thyrotoxicosis, unspecified without thyrotoxic crisis or storm: Secondary | ICD-10-CM | POA: Diagnosis not present

## 2022-07-06 ENCOUNTER — Other Ambulatory Visit (HOSPITAL_COMMUNITY): Payer: Self-pay | Admitting: Emergency Medicine

## 2022-07-06 LAB — T3, FREE: T3, Free: 3.5 pg/mL (ref 2.0–4.4)

## 2022-07-06 LAB — TSH: TSH: <0.005 u[IU]/mL — ABNORMAL LOW (ref 0.450–4.500)

## 2022-07-06 LAB — T4, FREE: Free T4: 1.85 ng/dL — ABNORMAL HIGH (ref 0.82–1.77)

## 2022-07-06 NOTE — Progress Notes (Signed)
Paramedicine Encounter    Patient ID: [name] [name], female    DOB: 1961-07-18, 61 y.o.   MRN: [id number]   BP 120/78 (BP Location: Left Arm, Patient Position: Sitting)   Pulse 80   Resp 16   Wt 198 lb (89.8 kg)   LMP 04/01/2010   SpO2 93%   BMI 33.99 kg/m  Weight yesterday-not taken Last visit weight-195lb  ATF Ms. Mankowski A&O x 4, skin W&D w/ good color.  Pt reports that she is doing well.  No chest pain or SOB.  No edema noted.  She is up 3lbs from last visit and says she's been eating more because of the holidays.  I encouraged her to continue to make good nutritional choices and watch her fluid and sodium intake.  She has done well with med compliance.  Med box reconciled x 1 week.  Home visit complete.    [name], [place] 07/06/2022   Patient Care Team: [name], MD as PCP - General (Family Medicine) Minus Breeding, MD as PCP - Cardiology (Cardiology) Craft, Lorel Monaco, RN as Case Manager [facility], Tami Lin, [place] as Physician Assistant (Cardiology) Ethelda Chick as Social Worker  Patient Active Problem List   Diagnosis Date Noted   Hyperthyroidism 04/23/2022   Acute on chronic heart failure with preserved ejection fraction (HFpEF) ([place]) 09/22/2021   Heart failure ([place]) 09/22/2021   CKD (chronic kidney disease) stage 4, GFR 15-29 ml/min ([place]) 07/03/2021   Acute respiratory failure due to COVID-19 ([place]) 05/06/2021   COVID-19 05/05/2021   CHF exacerbation ([place]) 04/02/2021   Chest pain 04/02/2021   Acute-on-chronic kidney injury ([place]) 04/02/2021   COPD exacerbation ([place]) 12/05/2020   CKD (chronic kidney disease), stage III ([place]) 12/04/2020   Anemia of chronic disease 12/04/2020   Obesity, Class III, BMI 40-49.9 (morbid obesity) ([place]) 07/27/2020   Acute respiratory disease due to COVID-19 virus 07/25/2020   Acute hypoxemic respiratory failure due to COVID-19 ([place]) 07/25/2020   Morbid obesity ([place]) 11/23/2019   Acute respiratory  failure with hypoxia ([place]) 11/06/2019   Chronic kidney disease, stage 3b ([place]) 11/06/2019   COPD without exacerbation ([place])    Acute kidney failure ([place]) 08/18/2019   Left ventricular hypertrophy 07/21/2019   Educated about COVID-19 virus infection 56/43/3295   Acute diastolic HF (heart failure) (HCC)    Hypoxia    OSA (obstructive sleep apnea) 05/09/2019   Hyperglycemia 04/09/2019   Snoring 03/13/2019   Chronic anemia 08/17/2018   Chronic cystitis 08/03/2018   Diabetic neuropathy ([place]) 06/16/2017   Non compliance w medication regimen 04/12/2017   Family history of colon cancer in mother 11/17/2016   Dyspareunia in female 11/11/2016   Screen for colon cancer 11/11/2016   History of ovarian cyst 11/11/2016   Chronic hepatitis C without hepatic coma ([place]) 11/09/2016   Hyperlipidemia 10/07/2016   Type 2 diabetes mellitus with hyperlipidemia ([place]) 01/29/2014   Status post intraocular lens implant 11/08/2013   PCO (posterior capsular opacification) 09/28/2013   Pseudophakia 09/12/2013   GERD (gastroesophageal reflux disease) 09/06/2013   Hypertension 09/06/2013   ANEMIA, IRON DEFICIENCY 10/03/2009   LIPOMA 06/18/2009   TINEA PEDIS 05/07/2009   SUBSTANCE ABUSE, MULTIPLE 02/22/2007   HCVD (hypertensive cardiovascular disease) 02/22/2007    Current Outpatient Medications:    albuterol (VENTOLIN HFA) 108 (90 Base) MCG/ACT inhaler, Inhale 1 puff into the lungs every 4 (four) hours as needed for wheezing or shortness of breath., Disp: 18 g, Rfl: 0   amLODipine (NORVASC) 2.5  MG tablet, Take 1 tablet (2.5 mg total) by mouth daily., Disp: 90 tablet, Rfl: 1   atorvastatin (LIPITOR) 40 MG tablet, Take 1 tablet (40 mg total) by mouth every morning., Disp: 90 tablet, Rfl: 1   carvedilol (COREG) 25 MG tablet, TAKE ONE TABLET BY MOUTH twice daily, Disp: 180 tablet, Rfl: 3   empagliflozin (JARDIANCE) 10 MG TABS tablet, Take 1 tablet (10 mg total) by mouth daily., Disp: 90 tablet, Rfl: 1    famotidine (PEPCID) 40 MG tablet, TAKE ONE TABLET BY MOUTH daily, Disp: 30 tablet, Rfl: 3   ferrous sulfate 325 (65 FE) MG tablet, Take 325 mg by mouth daily with breakfast., Disp: , Rfl:    hydrALAZINE (APRESOLINE) 50 MG tablet, Take 1.5 tablets (75 mg total) by mouth 3 (three) times daily., Disp: 135 tablet, Rfl: 8   oxybutynin (DITROPAN) 5 MG tablet, Take 1 tablet (5 mg total) by mouth 2 (two) times daily., Disp: 60 tablet, Rfl: 6   pregabalin (LYRICA) 75 MG capsule, Take 1 capsule (75 mg total) by mouth 2 (two) times daily., Disp: 180 capsule, Rfl: 1   Semaglutide, 1 MG/DOSE, 4 MG/3ML SOPN, Inject 1 mg as directed once a week., Disp: 3 mL, Rfl: 6   spironolactone (ALDACTONE) 25 MG tablet, TAKE 1/2 TABLET BY MOUTH daily, Disp: 30 tablet, Rfl: 3   torsemide (DEMADEX) 20 MG tablet, Take 20 mg alternating with 40 mg daily, Disp: 90 tablet, Rfl: 6   hydrocortisone-pramoxine (ANALPRAM HC) 2.5-1 % rectal cream, Place 1 Application rectally 3 (three) times daily. (Patient not taking: Reported on 06/22/2022), Disp: 30 g, Rfl: 1   methimazole (TAPAZOLE) 5 MG tablet, Take 1 tablet (5 mg total) by mouth daily. (Patient not taking: Reported on 07/06/2022), Disp: 90 tablet, Rfl: 1   mometasone-formoterol (DULERA) 100-5 MCG/ACT AERO, Inhale 2 puffs into the lungs as needed for wheezing. (Patient not taking: Reported on 07/06/2022), Disp: , Rfl:    nystatin ointment (MYCOSTATIN), Apply 1 application. topically 2 (two) times daily as needed (rash/skin irritation). (Patient not taking: Reported on 06/22/2022), Disp: 30 g, Rfl: 1 Allergies  Allergen Reactions   Lisinopril Swelling      Social History   Socioeconomic History   Marital status: Legally Separated    Spouse name: Not on file   Number of children: 1   Years of education: Not on file   Highest education level: 9th grade  Occupational History   Occupation: disability  Tobacco Use   Smoking status: Never   Smokeless tobacco: Never  Vaping Use    Vaping Use: Never used  Substance and Sexual Activity   Alcohol use: No   Drug use: Not Currently    Types: Cocaine, Marijuana    Comment: remote h/o cocaine 2015 and marijuana 2018   Sexual activity: Not Currently  Other Topics Concern   Not on file  Social History Narrative   Lives with friend, Rosalee Kaufman and nephew.  One daughter.    Social Determinants of Health   Financial Resource Strain: Low Risk  (12/29/2021)   Overall Financial Resource Strain (CARDIA)    Difficulty of Paying Living Expenses: Not very hard  Food Insecurity: No Food Insecurity (07/02/2022)   Hunger Vital Sign    Worried About Running Out of Food in the Last Year: Never true    Ran Out of Food in the Last Year: Never true  Transportation Needs: No Transportation Needs (07/02/2022)   PRAPARE - Hydrologist (Medical):  No    Lack of Transportation (Non-Medical): No  Physical Activity: Insufficiently Active (02/04/2022)   Exercise Vital Sign    Days of Exercise per Week: 1 day    Minutes of Exercise per Session: 30 min  Stress: No Stress Concern Present (04/13/2022)   [place]    Feeling of Stress : Not at all  Social Connections: Moderately Isolated (07/02/2022)   Social Connection and Isolation Panel [NHANES]    Frequency of Communication with Friends and Family: More than three times a week    Frequency of Social Gatherings with Friends and Family: More than three times a week    Attends Religious Services: More than 4 times per year    Active Member of [employer] or Organizations: No    Attends Archivist Meetings: Never    Marital Status: Divorced  Human resources officer Violence: Not At Risk (02/04/2022)   Humiliation, Afraid, Rape, and Kick questionnaire    Fear of Current or Ex-Partner: No    Emotionally Abused: No    Physically Abused: No    Sexually Abused: No    Physical Exam      Future  Appointments  Date Time Provider [place]  07/22/2022 12:30 PM Craft, Lorel Monaco, RN [facility] None  09/06/2022 10:10 AM [name], MD [facility] None       [name], EMT-P-Paramedic [place] Paramedic  07/06/22

## 2022-07-07 ENCOUNTER — Telehealth (HOSPITAL_COMMUNITY): Payer: Self-pay | Admitting: Licensed Clinical Social Worker

## 2022-07-07 NOTE — Telephone Encounter (Signed)
HF Paramedicine Team Based Care Meeting  HF MD- NA  HF NP - [place] NP-C   [facility] admit within the last 30 days for heart failure?  no  Medications concerns? Not consistent with meds at this time- has had family concerns and medical issues that distract her.  Eligible for discharge? Hopeful for DC soon if she proves compliance when she is more stable.  Jorge Ny, LCSW Clinical Social Worker [facility] Desk#: [phone] Cell#: [phone]

## 2022-07-13 ENCOUNTER — Other Ambulatory Visit (HOSPITAL_COMMUNITY): Payer: Self-pay | Admitting: Emergency Medicine

## 2022-07-13 NOTE — Progress Notes (Signed)
Paramedicine Encounter    Patient ID: [name] [name], female    DOB: 05/30/1961, 61 y.o.   MRN: [id number]   BP 138/88 (BP Location: Left Arm, Patient Position: Sitting, Cuff Size: Normal)   Pulse 75   Resp 16   Wt 194 lb (88 kg)   LMP 04/01/2010   SpO2 96%   BMI 33.30 kg/m  Weight yesterday-not taken Last visit weight-198lb  ATF Ms. Goebel A&O x 4, skin W&D w/ good color.  Pt denies chest pain or SOB.  Lung sounds clear and equal bilat and no peripheral edema noted.  She has done well with her med compliance.  She states she knows she ate too much over the holidays and currently complains of constipation.  Med box reconciled x 1 week.  Discussed with her that she needs to prepare for discharge at some point in the future.  She states she has been under stress as she has had some breast biopsies and is uncertain about what the future holds in that regard.  I continue to support her and encourage to remain on track with her medications and appointments. Home visit complete.    [name], [place] 07/13/2022        Patient Care Team: [name], MD as PCP - General (Family Medicine) Minus Breeding, MD as PCP - Cardiology (Cardiology) Craft, Lorel Monaco, RN as Case Manager [facility], Tami Lin, [place] as Physician Assistant (Cardiology) Ethelda Chick as Social Worker  Patient Active Problem List   Diagnosis Date Noted   Hyperthyroidism 04/23/2022   Acute on chronic heart failure with preserved ejection fraction (HFpEF) ([place]) 09/22/2021   Heart failure ([place]) 09/22/2021   CKD (chronic kidney disease) stage 4, GFR 15-29 ml/min ([place]) 07/03/2021   Acute respiratory failure due to COVID-19 ([place]) 05/06/2021   COVID-19 05/05/2021   CHF exacerbation ([place]) 04/02/2021   Chest pain 04/02/2021   Acute-on-chronic kidney injury ([place]) 04/02/2021   COPD exacerbation ([place]) 12/05/2020   CKD (chronic kidney disease), stage III ([place]) 12/04/2020   Anemia of chronic  disease 12/04/2020   Obesity, Class III, BMI 40-49.9 (morbid obesity) ([place]) 07/27/2020   Acute respiratory disease due to COVID-19 virus 07/25/2020   Acute hypoxemic respiratory failure due to COVID-19 ([place]) 07/25/2020   Morbid obesity ([place]) 11/23/2019   Acute respiratory failure with hypoxia ([place]) 11/06/2019   Chronic kidney disease, stage 3b ([place]) 11/06/2019   COPD without exacerbation ([place])    Acute kidney failure ([place]) 08/18/2019   Left ventricular hypertrophy 07/21/2019   Educated about COVID-19 virus infection 78/29/5621   Acute diastolic HF (heart failure) (HCC)    Hypoxia    OSA (obstructive sleep apnea) 05/09/2019   Hyperglycemia 04/09/2019   Snoring 03/13/2019   Chronic anemia 08/17/2018   Chronic cystitis 08/03/2018   Diabetic neuropathy ([place]) 06/16/2017   Non compliance w medication regimen 04/12/2017   Family history of colon cancer in mother 11/17/2016   Dyspareunia in female 11/11/2016   Screen for colon cancer 11/11/2016   History of ovarian cyst 11/11/2016   Chronic hepatitis C without hepatic coma ([place]) 11/09/2016   Hyperlipidemia 10/07/2016   Type 2 diabetes mellitus with hyperlipidemia ([place]) 01/29/2014   Status post intraocular lens implant 11/08/2013   PCO (posterior capsular opacification) 09/28/2013   Pseudophakia 09/12/2013   GERD (gastroesophageal reflux disease) 09/06/2013   Hypertension 09/06/2013   ANEMIA, IRON DEFICIENCY 10/03/2009   LIPOMA 06/18/2009   TINEA PEDIS 05/07/2009   SUBSTANCE ABUSE, MULTIPLE 02/22/2007  HCVD (hypertensive cardiovascular disease) 02/22/2007    Current Outpatient Medications:    albuterol (VENTOLIN HFA) 108 (90 Base) MCG/ACT inhaler, Inhale 1 puff into the lungs every 4 (four) hours as needed for wheezing or shortness of breath., Disp: 18 g, Rfl: 0   amLODipine (NORVASC) 2.5 MG tablet, Take 1 tablet (2.5 mg total) by mouth daily., Disp: 90 tablet, Rfl: 1   atorvastatin (LIPITOR) 40 MG tablet, Take 1 tablet (40 mg  total) by mouth every morning., Disp: 90 tablet, Rfl: 1   carvedilol (COREG) 25 MG tablet, TAKE ONE TABLET BY MOUTH twice daily, Disp: 180 tablet, Rfl: 3   empagliflozin (JARDIANCE) 10 MG TABS tablet, Take 1 tablet (10 mg total) by mouth daily., Disp: 90 tablet, Rfl: 1   famotidine (PEPCID) 40 MG tablet, TAKE ONE TABLET BY MOUTH daily, Disp: 30 tablet, Rfl: 3   ferrous sulfate 325 (65 FE) MG tablet, Take 325 mg by mouth daily with breakfast., Disp: , Rfl:    hydrALAZINE (APRESOLINE) 50 MG tablet, Take 1.5 tablets (75 mg total) by mouth 3 (three) times daily., Disp: 135 tablet, Rfl: 8   oxybutynin (DITROPAN) 5 MG tablet, Take 1 tablet (5 mg total) by mouth 2 (two) times daily., Disp: 60 tablet, Rfl: 6   pregabalin (LYRICA) 75 MG capsule, Take 1 capsule (75 mg total) by mouth 2 (two) times daily., Disp: 180 capsule, Rfl: 1   Semaglutide, 1 MG/DOSE, 4 MG/3ML SOPN, Inject 1 mg as directed once a week., Disp: 3 mL, Rfl: 6   spironolactone (ALDACTONE) 25 MG tablet, TAKE 1/2 TABLET BY MOUTH daily, Disp: 30 tablet, Rfl: 3   torsemide (DEMADEX) 20 MG tablet, Take 20 mg alternating with 40 mg daily, Disp: 90 tablet, Rfl: 6   hydrocortisone-pramoxine (ANALPRAM HC) 2.5-1 % rectal cream, Place 1 Application rectally 3 (three) times daily. (Patient not taking: Reported on 06/22/2022), Disp: 30 g, Rfl: 1   methimazole (TAPAZOLE) 5 MG tablet, Take 1 tablet (5 mg total) by mouth daily. (Patient not taking: Reported on 07/06/2022), Disp: 90 tablet, Rfl: 1   mometasone-formoterol (DULERA) 100-5 MCG/ACT AERO, Inhale 2 puffs into the lungs as needed for wheezing. (Patient not taking: Reported on 07/06/2022), Disp: , Rfl:    nystatin ointment (MYCOSTATIN), Apply 1 application. topically 2 (two) times daily as needed (rash/skin irritation). (Patient not taking: Reported on 06/22/2022), Disp: 30 g, Rfl: 1 Allergies  Allergen Reactions   Lisinopril Swelling      Social History   Socioeconomic History   Marital status:  Legally Separated    Spouse name: Not on file   Number of children: 1   Years of education: Not on file   Highest education level: 9th grade  Occupational History   Occupation: disability  Tobacco Use   Smoking status: Never   Smokeless tobacco: Never  Vaping Use   Vaping Use: Never used  Substance and Sexual Activity   Alcohol use: No   Drug use: Not Currently    Types: Cocaine, Marijuana    Comment: remote h/o cocaine 2015 and marijuana 2018   Sexual activity: Not Currently  Other Topics Concern   Not on file  Social History Narrative   Lives with friend, Rosalee Kaufman and nephew.  One daughter.    Social Determinants of Health   Financial Resource Strain: Low Risk  (12/29/2021)   Overall Financial Resource Strain (CARDIA)    Difficulty of Paying Living Expenses: Not very hard  Food Insecurity: No Food Insecurity (07/02/2022)  Hunger Vital Sign    Worried About Running Out of Food in the Last Year: Never true    Ran Out of Food in the Last Year: Never true  Transportation Needs: No Transportation Needs (07/02/2022)   PRAPARE - Hydrologist (Medical): No    Lack of Transportation (Non-Medical): No  Physical Activity: Insufficiently Active (02/04/2022)   Exercise Vital Sign    Days of Exercise per Week: 1 day    Minutes of Exercise per Session: 30 min  Stress: No Stress Concern Present (04/13/2022)   [place]    Feeling of Stress : Not at all  Social Connections: Moderately Isolated (07/02/2022)   Social Connection and Isolation Panel [NHANES]    Frequency of Communication with Friends and Family: More than three times a week    Frequency of Social Gatherings with Friends and Family: More than three times a week    Attends Religious Services: More than 4 times per year    Active Member of [employer] or Organizations: No    Attends Archivist Meetings: Never    Marital Status:  Divorced  Human resources officer Violence: Not At Risk (02/04/2022)   Humiliation, Afraid, Rape, and Kick questionnaire    Fear of Current or Ex-Partner: No    Emotionally Abused: No    Physically Abused: No    Sexually Abused: No    Physical Exam      Future Appointments  Date Time Provider [place]  07/22/2022 12:30 PM Craft, Lorel Monaco, RN [facility] None  09/06/2022 10:10 AM [name], MD [facility] None       [name], [place] Paramedic  07/13/22

## 2022-07-16 ENCOUNTER — Other Ambulatory Visit (HOSPITAL_COMMUNITY): Payer: Self-pay

## 2022-07-20 ENCOUNTER — Other Ambulatory Visit (HOSPITAL_COMMUNITY): Payer: Self-pay | Admitting: Emergency Medicine

## 2022-07-20 NOTE — Progress Notes (Signed)
Paramedicine Encounter    Patient ID: [name] [name], female    DOB: 1961/06/26, 62 y.o.   MRN: [id number]   BP (!) 140/80 (BP Location: Right Arm, Patient Position: Sitting, Cuff Size: Normal)   Pulse 84   Resp 16   Wt 196 lb 9.6 oz (89.2 kg)   LMP 04/01/2010   SpO2 98%   BMI 33.75 kg/m  Weight yesterday-not taken Last visit weight-194lb   ATF Ms. Margerum A&O x 4, skin W&D w/ good color.  Pt. Denies chest pain or SOB.  Lung sounds clear and equal bilat.  No edema to lower extremities.  She has done a great job with medication compliance.  Med box reconciled x 1 week.  Home visit complete.    [name], [place] 07/20/2022     Patient Care Team: [name], MD as PCP - General (Family Medicine) Minus Breeding, MD as PCP - Cardiology (Cardiology) Craft, Lorel Monaco, RN as Case Manager [place], Tami Lin, [place] as Physician Assistant (Cardiology) Ethelda Chick as Social Worker  Patient Active Problem List   Diagnosis Date Noted   Hyperthyroidism 04/23/2022   Acute on chronic heart failure with preserved ejection fraction (HFpEF) ([place]) 09/22/2021   Heart failure ([place]) 09/22/2021   CKD (chronic kidney disease) stage 4, GFR 15-29 ml/min ([place]) 07/03/2021   Acute respiratory failure due to COVID-19 ([place]) 05/06/2021   COVID-19 05/05/2021   CHF exacerbation ([place]) 04/02/2021   Chest pain 04/02/2021   Acute-on-chronic kidney injury ([place]) 04/02/2021   COPD exacerbation ([place]) 12/05/2020   CKD (chronic kidney disease), stage III ([place]) 12/04/2020   Anemia of chronic disease 12/04/2020   Obesity, Class III, BMI 40-49.9 (morbid obesity) ([place]) 07/27/2020   Acute respiratory disease due to COVID-19 virus 07/25/2020   Acute hypoxemic respiratory failure due to COVID-19 ([place]) 07/25/2020   Morbid obesity ([place]) 11/23/2019   Acute respiratory failure with hypoxia ([place]) 11/06/2019   Chronic kidney disease, stage 3b ([place]) 11/06/2019   COPD without exacerbation ([place])     Acute kidney failure ([place]) 08/18/2019   Left ventricular hypertrophy 07/21/2019   Educated about COVID-19 virus infection 27/09/5007   Acute diastolic HF (heart failure) (HCC)    Hypoxia    OSA (obstructive sleep apnea) 05/09/2019   Hyperglycemia 04/09/2019   Snoring 03/13/2019   Chronic anemia 08/17/2018   Chronic cystitis 08/03/2018   Diabetic neuropathy ([place]) 06/16/2017   Non compliance w medication regimen 04/12/2017   Family history of colon cancer in mother 11/17/2016   Dyspareunia in female 11/11/2016   Screen for colon cancer 11/11/2016   History of ovarian cyst 11/11/2016   Chronic hepatitis C without hepatic coma ([place]) 11/09/2016   Hyperlipidemia 10/07/2016   Type 2 diabetes mellitus with hyperlipidemia ([place]) 01/29/2014   Status post intraocular lens implant 11/08/2013   PCO (posterior capsular opacification) 09/28/2013   Pseudophakia 09/12/2013   GERD (gastroesophageal reflux disease) 09/06/2013   Hypertension 09/06/2013   ANEMIA, IRON DEFICIENCY 10/03/2009   LIPOMA 06/18/2009   TINEA PEDIS 05/07/2009   SUBSTANCE ABUSE, MULTIPLE 02/22/2007   HCVD (hypertensive cardiovascular disease) 02/22/2007    Current Outpatient Medications:    albuterol (VENTOLIN HFA) 108 (90 Base) MCG/ACT inhaler, Inhale 1 puff into the lungs every 4 (four) hours as needed for wheezing or shortness of breath., Disp: 18 g, Rfl: 0   amLODipine (NORVASC) 2.5 MG tablet, Take 1 tablet (2.5 mg total) by mouth daily., Disp: 90 tablet, Rfl: 1   atorvastatin (LIPITOR) 40 MG tablet,  Take 1 tablet (40 mg total) by mouth every morning., Disp: 90 tablet, Rfl: 1   carvedilol (COREG) 25 MG tablet, TAKE ONE TABLET BY MOUTH twice daily, Disp: 180 tablet, Rfl: 3   empagliflozin (JARDIANCE) 10 MG TABS tablet, Take 1 tablet (10 mg total) by mouth daily., Disp: 90 tablet, Rfl: 1   famotidine (PEPCID) 40 MG tablet, TAKE ONE TABLET BY MOUTH daily, Disp: 30 tablet, Rfl: 3   ferrous sulfate 325 (65 FE) MG tablet,  Take 325 mg by mouth daily with breakfast., Disp: , Rfl:    hydrALAZINE (APRESOLINE) 50 MG tablet, Take 1.5 tablets (75 mg total) by mouth 3 (three) times daily., Disp: 135 tablet, Rfl: 8   hydrocortisone-pramoxine (ANALPRAM HC) 2.5-1 % rectal cream, Place 1 Application rectally 3 (three) times daily. (Patient not taking: Reported on 06/22/2022), Disp: 30 g, Rfl: 1   methimazole (TAPAZOLE) 5 MG tablet, Take 1 tablet (5 mg total) by mouth daily. (Patient not taking: Reported on 07/06/2022), Disp: 90 tablet, Rfl: 1   mometasone-formoterol (DULERA) 100-5 MCG/ACT AERO, Inhale 2 puffs into the lungs as needed for wheezing. (Patient not taking: Reported on 07/06/2022), Disp: , Rfl:    nystatin ointment (MYCOSTATIN), Apply 1 application. topically 2 (two) times daily as needed (rash/skin irritation). (Patient not taking: Reported on 06/22/2022), Disp: 30 g, Rfl: 1   oxybutynin (DITROPAN) 5 MG tablet, Take 1 tablet (5 mg total) by mouth 2 (two) times daily., Disp: 60 tablet, Rfl: 6   pregabalin (LYRICA) 75 MG capsule, Take 1 capsule (75 mg total) by mouth 2 (two) times daily., Disp: 180 capsule, Rfl: 1   Semaglutide, 1 MG/DOSE, 4 MG/3ML SOPN, Inject 1 mg as directed once a week., Disp: 3 mL, Rfl: 6   spironolactone (ALDACTONE) 25 MG tablet, TAKE 1/2 TABLET BY MOUTH daily, Disp: 30 tablet, Rfl: 3   torsemide (DEMADEX) 20 MG tablet, Take 20 mg alternating with 40 mg daily, Disp: 90 tablet, Rfl: 6 Allergies  Allergen Reactions   Lisinopril Swelling      Social History   Socioeconomic History   Marital status: Legally Separated    Spouse name: Not on file   Number of children: 1   Years of education: Not on file   Highest education level: 9th grade  Occupational History   Occupation: disability  Tobacco Use   Smoking status: Never   Smokeless tobacco: Never  Vaping Use   Vaping Use: Never used  Substance and Sexual Activity   Alcohol use: No   Drug use: Not Currently    Types: Cocaine,  Marijuana    Comment: remote h/o cocaine 2015 and marijuana 2018   Sexual activity: Not Currently  Other Topics Concern   Not on file  Social History Narrative   Lives with friend, Rosalee Kaufman and nephew.  One daughter.    Social Determinants of Health   Financial Resource Strain: Low Risk  (12/29/2021)   Overall Financial Resource Strain (CARDIA)    Difficulty of Paying Living Expenses: Not very hard  Food Insecurity: No Food Insecurity (07/02/2022)   Hunger Vital Sign    Worried About Running Out of Food in the Last Year: Never true    Ran Out of Food in the Last Year: Never true  Transportation Needs: No Transportation Needs (07/02/2022)   PRAPARE - Hydrologist (Medical): No    Lack of Transportation (Non-Medical): No  Physical Activity: Insufficiently Active (02/04/2022)   Exercise Vital Sign  Days of Exercise per Week: 1 day    Minutes of Exercise per Session: 30 min  Stress: No Stress Concern Present (04/13/2022)   [place]    Feeling of Stress : Not at all  Social Connections: Moderately Isolated (07/02/2022)   Social Connection and Isolation Panel [NHANES]    Frequency of Communication with Friends and Family: More than three times a week    Frequency of Social Gatherings with Friends and Family: More than three times a week    Attends Religious Services: More than 4 times per year    Active Member of [employer] or Organizations: No    Attends Archivist Meetings: Never    Marital Status: Divorced  Human resources officer Violence: Not At Risk (02/04/2022)   Humiliation, Afraid, Rape, and Kick questionnaire    Fear of Current or Ex-Partner: No    Emotionally Abused: No    Physically Abused: No    Sexually Abused: No    Physical Exam      Future Appointments  Date Time Provider [place]  07/22/2022 12:30 PM Craft, Lorel Monaco, RN [facility] None  09/06/2022 10:10 AM  [name], MD [facility] None       [name], [place] Paramedic  07/20/22

## 2022-07-22 ENCOUNTER — Other Ambulatory Visit: Payer: Medicare Other | Admitting: Obstetrics and Gynecology

## 2022-07-22 ENCOUNTER — Encounter: Payer: Self-pay | Admitting: Obstetrics and Gynecology

## 2022-07-22 NOTE — Patient Outreach (Signed)
Medicaid Managed Care   Nurse Care Manager Note  07/22/2022 Name:  [name] [name] MRN:  [id number] DOB:  04-Jun-1961  [name] [name] is an 62 y.o. year old female who is a primary patient of [name], MD.  The [facility] Managed Care Coordination team was consulted for assistance with:    Chronic healthcare management needs, DM, HTN, COPD, OSA, CKD, CHF, HLD, hypothyroid  [name] was given information about Medicaid Managed Care Coordination team services today. [name] Patient agreed to services and verbal consent obtained.  Engaged with patient by telephone for follow up visit in response to provider referral for case management and/or care coordination services.   Assessments/Interventions:  Review of past medical history, allergies, medications, health status, including review of consultants reports, laboratory and other test data, was performed as part of comprehensive evaluation and provision of chronic care management services.  SDOH (Social Determinants of Health) assessments and interventions performed: SDOH Interventions    Flowsheet Row Patient Outreach Telephone from 07/22/2022 in [place] Patient Outreach Telephone from 04/13/2022 in [place] Patient Outreach Telephone from 02/04/2022 in [place] Patient Outreach Telephone from 12/29/2021 in [place] Patient Outreach Telephone from 11/26/2021 in [place] Patient Outreach Telephone from 10/22/2021 in [place] Interventions        Food Insecurity Interventions -- -- -- -- Intervention Not Indicated --  Housing Interventions -- -- -- -- -- Intervention Not Indicated  Transportation Interventions -- -- -- -- Intervention Not Indicated --  Utilities Interventions --  Intervention Not Indicated -- -- -- --  Financial Strain Interventions Intervention Not Indicated -- -- Intervention Not Indicated -- --  Physical Activity Interventions -- -- Other (Comments)  [patient not physically able to engage in a moderate to strenuous exercise] -- -- --  Stress Interventions -- Intervention Not Indicated -- -- -- --     Care Plan  Allergies  Allergen Reactions   Lisinopril Swelling    Medications Reviewed Today     Reviewed by Gayla Medicus, RN (Registered Nurse) on 07/22/22 at [phone]  Med List Status: <None>   Medication Order Taking? Sig Documenting Provider Last Dose Status Informant  albuterol (VENTOLIN HFA) 108 (90 Base) MCG/ACT inhaler [id number] No Inhale 1 puff into the lungs every 4 (four) hours as needed for wheezing or shortness of breath. [name], MD Taking Active Self, Spouse/Significant Other           Med Note [facility], CARLY I   Fri Jul 02, 2022 10:32 AM) As needed  amLODipine (NORVASC) 2.5 MG tablet [id number] No Take 1 tablet (2.5 mg total) by mouth daily. [name], MD Taking Active   atorvastatin (LIPITOR) 40 MG tablet [id number] No Take 1 tablet (40 mg total) by mouth every morning. [name], MD Taking Active   carvedilol (COREG) 25 MG tablet [id number] No TAKE ONE TABLET BY MOUTH twice daily Minus Breeding, MD Taking Active   empagliflozin (JARDIANCE) 10 MG TABS tablet [id number] No Take 1 tablet (10 mg total) by mouth daily. [name], MD Taking Active   famotidine (PEPCID) 40 MG tablet [id number] No TAKE ONE TABLET BY MOUTH daily [name], MD Taking Active   ferrous sulfate 325 (65 FE) MG tablet [id number] No Take 325 mg by mouth daily with breakfast. [provider] Taking Active   hydrALAZINE (APRESOLINE)  50 MG tablet [id number] No Take 1.5 tablets (75 mg total) by mouth 3 (three) times daily. [place], Maricela Bo, FNP Taking Active   hydrocortisone-pramoxine [facility]) 2.5-1 % rectal cream [id number] No  Place 1 Application rectally 3 (three) times daily.  Patient not taking: Reported on 06/22/2022   [name], MD Not Taking Active   methimazole (TAPAZOLE) 5 MG tablet [id number] No Take 1 tablet (5 mg total) by mouth daily.  Patient not taking: Reported on 07/06/2022   Brita Romp, NP Not Taking Active            Med Note [name], [name]   Tue Jun 08, 2022  3:21 PM) Discontinued per endocrinologist  mometasone-formoterol [facility]) 100-5 MCG/ACT AERO [id number] No Inhale 2 puffs into the lungs as needed for wheezing.  Patient not taking: Reported on 07/06/2022   [provider] Not Taking Active   nystatin ointment (MYCOSTATIN) [id number] No Apply 1 application. topically 2 (two) times daily as needed (rash/skin irritation).  Patient not taking: Reported on 06/22/2022   [name], MD Not Taking Active            Med Note [name], [name]   Tue Apr 20, 2022 11:23 AM)    oxybutynin (DITROPAN) 5 MG tablet [id number] No Take 1 tablet (5 mg total) by mouth 2 (two) times daily. [name], MD Taking Active   pregabalin (LYRICA) 75 MG capsule [id number] No Take 1 capsule (75 mg total) by mouth 2 (two) times daily. [name], MD Taking Active   Semaglutide, 1 MG/DOSE, 4 MG/3ML SOPN [id number] No Inject 1 mg as directed once a week. [name], MD Taking Active   spironolactone (ALDACTONE) 25 MG tablet [id number] No TAKE 1/2 TABLET BY MOUTH daily [place], [place] Taking Active   torsemide (DEMADEX) 20 MG tablet [id number] No Take 20 mg alternating with 40 mg daily [place], [place] Taking Active            Patient Active Problem List   Diagnosis Date Noted   Hyperthyroidism 04/23/2022   Acute on chronic heart failure with preserved ejection fraction (HFpEF) ([place]) 09/22/2021   Heart failure ([place]) 09/22/2021   CKD (chronic kidney disease) stage 4, GFR 15-29 ml/min ([place]) 07/03/2021   Acute respiratory failure due to COVID-19 ([place]) 05/06/2021   COVID-19  05/05/2021   CHF exacerbation ([place]) 04/02/2021   Chest pain 04/02/2021   Acute-on-chronic kidney injury ([place]) 04/02/2021   COPD exacerbation ([place]) 12/05/2020   CKD (chronic kidney disease), stage III ([place]) 12/04/2020   Anemia of chronic disease 12/04/2020   Obesity, Class III, BMI 40-49.9 (morbid obesity) ([place]) 07/27/2020   Acute respiratory disease due to COVID-19 virus 07/25/2020   Acute hypoxemic respiratory failure due to COVID-19 ([place]) 07/25/2020   Morbid obesity ([place]) 11/23/2019   Acute respiratory failure with hypoxia ([place]) 11/06/2019   Chronic kidney disease, stage 3b ([place]) 11/06/2019   COPD without exacerbation ([place])    Acute kidney failure ([place]) 08/18/2019   Left ventricular hypertrophy 07/21/2019   Educated about COVID-19 virus infection 47/03/6282   Acute diastolic HF (heart failure) (HCC)    Hypoxia    OSA (obstructive sleep apnea) 05/09/2019   Hyperglycemia 04/09/2019   Snoring 03/13/2019   Chronic anemia 08/17/2018   Chronic cystitis 08/03/2018   Diabetic neuropathy ([place]) 06/16/2017   Non compliance w medication regimen 04/12/2017   Family history of colon cancer in mother 11/17/2016   Dyspareunia in female 11/11/2016   Screen for colon cancer  11/11/2016   History of ovarian cyst 11/11/2016   Chronic hepatitis C without hepatic coma ([place]) 11/09/2016   Hyperlipidemia 10/07/2016   Type 2 diabetes mellitus with hyperlipidemia ([place]) 01/29/2014   Status post intraocular lens implant 11/08/2013   PCO (posterior capsular opacification) 09/28/2013   Pseudophakia 09/12/2013   GERD (gastroesophageal reflux disease) 09/06/2013   Hypertension 09/06/2013   ANEMIA, IRON DEFICIENCY 10/03/2009   LIPOMA 06/18/2009   TINEA PEDIS 05/07/2009   SUBSTANCE ABUSE, MULTIPLE 02/22/2007   HCVD (hypertensive cardiovascular disease) 02/22/2007   Conditions to be addressed/monitored per PCP order:  Chronic healthcare management needs, DM, HTN, COPD, OSA, CKD, CHF, HLD,  hypothyroid  Care Plan : General Plan of Care (Adult)  Updates made by Gayla Medicus, RN since 07/22/2022 12:00 AM     Problem: Quality of Life (General Plan of Care)   Priority: High  Onset Date: 10/29/2020     Long-Range Goal: Quality of Life Maintained   Start Date: 10/29/2020  Expected End Date: 09/17/2022  Recent Progress: Not on track  Priority: High  Note:    CARE PLAN ENTRY Medicaid Managed Care (see longitudinal plan of care for additional care plan information)  Current Barriers:  Chronic case management needs related to chronic health conditions, HTN, DM COPD, OSA, CKD, CHF, HLD, hypothyroid 07/22/22:   Patient with no complaints today, continues to be followed by Paramedicine program.  Does not check BP and states blood sugars in 100s-no exact readings.  Most recent A1C=7.5 on 05/31/22.  Patient has appt. With PCP in February-needs to follow up on radiologic studies that were ordered several months ago-states she will follow up with Dr. Margarita Rana.  Currently with no breathing issues and no swelling.  Nurse Case Manager Clinical Goal(s):  Over the next 30 days, patient will verbalize understanding of plan for decreased swelling.  Over the next 30 days, patient will work with provider to address needs  Over the next 30 days, patient will continue to work with Pharmacist. Over the next 30 days, patient will attend all scheduled appointments.  Interventions:  Inter-disciplinary care team collaboration (see longitudinal plan of care) Reviewed medications with patient. Discussed plans with patient for ongoing care management follow up and provided patient with direct contact information for care management team. Collaboration with Pharmacist for review of medications. Pharmacy referral for medication review. Collaborated with BSW for resources. BSW  Referral for resources-completed Collaborated with PCP for GI referral at patient request Care Guide referral for Medicare  information Collaborated with Care Guide.  Plan:  Patient will follow up with provider and fill all prescriptions RNCM will follow up with patient within 45  business days. Patient will follow up with Dr. Margarita Rana regarding radiologic studies ordered for patient.   Follow Up:  Patient agrees to Care Plan and Follow-up.  Plan: The Managed Medicaid care management team will reach out to the patient again over the next 45 business  days. and The  Patient has been provided with contact information for the Managed Medicaid care management team and has been advised to call with any health related questions or concerns.  Date/time of next scheduled RN care management/care coordination outreach: 09/07/22 at 1230.

## 2022-07-22 NOTE — Patient Instructions (Signed)
Hi [name] [name], glad you are doing well, have a great day!!  [name] [name] was given information about Medicaid Managed Care team care coordination services as a part of their [place] Medicaid benefit. [name] [name] verbally consented to engagement with the [facility] Managed Care team.   If you are experiencing a medical emergency, please call 911 or report to your local emergency department or urgent care.   If you have a non-emergency medical problem during routine business hours, please contact your provider's office and ask to speak with a nurse.   For questions related to your [facility], please call: [phone] or visit the homepage here: [email or web address]  If you would like to schedule transportation through your [facility], please call the following number at least 2 days in advance of your appointment: [phone]   Rides for urgent appointments can also be made after hours by calling Member Services.  Call the [place] at [phone], at any time, 24 hours a day, 7 days a week. If you are in danger or need immediate medical attention call 911.  If you would like help to quit smoking, call 1-800-QUIT-NOW (901) 758-2191) OR Espaol: 1-855-Djelo-Ya (3-570-177-9390) o para ms informacin haga clic aqu or Text READY to 200-400 to register via text  [name] [name] - following are the goals we discussed in your visit today:   Goals Addressed             This Visit's Progress    Protect My Health       Timeframe:  Long-Range Goal Priority:  Medium Start Date:          10/29/20                   Expected End Date: ongoing             Follow Up Date: 09/07/22   - schedule appointment for flu shot - schedule appointment for vaccines needed due to my age or health - schedule recommended health tests (blood work, mammogram,  colonoscopy, pap test) - schedule and keep appointment for annual check-up   07/22/22  Patient being followed by Paramedicine weekly.  Has PCP appt in Feb    Why is this important?   Screening tests can find diseases early when they are easier to treat.  Your doctor or nurse will talk with you about which tests are important for you.  Getting shots for common diseases like the flu and shingles will help prevent them.      Patient verbalizes understanding of instructions and care plan provided today and agrees to view in [place]. Active MyChart status and patient understanding of how to access instructions and care plan via MyChart confirmed with patient.     The Managed Medicaid care management team will reach out to the patient again over the next 45 business  days.  The  Patient  has been provided with contact information for the Managed Medicaid care management team and has been advised to call with any health related questions or concerns.   [name] RN, BSN [place] Management Coordinator - Managed Medicaid High Risk [phone]   Following is a copy of your plan of care:  Care Plan : General Plan of Care (Adult)  Updates made by Gayla Medicus, RN since 07/22/2022 12:00 AM     Problem: Quality of Life (General Plan of Care)  Priority: High  Onset Date: 10/29/2020     Long-Range Goal: Quality of Life Maintained   Start Date: 10/29/2020  Expected End Date: 09/17/2022  Recent Progress: Not on track  Priority: High  Note:    CARE PLAN ENTRY Medicaid Managed Care (see longitudinal plan of care for additional care plan information)  Current Barriers:  Chronic case management needs related to chronic health conditions, HTN, DM, COPD, OSA, CKD, CHF, HLD, hypothyroid 07/22/22:   Patient with no complaints today, continues to be followed by Paramedicine program.  Does not check BP and states blood sugars in 100s-no exact readings.  Most recent  A1C=7.5 on 05/31/22.  Patient has appt. With PCP in February-needs to follow up on radiologic studies that were ordered several months ago-states she will follow up with Dr. Margarita Rana.  Currently with no breathing issues and no swelling.  Nurse Case Manager Clinical Goal(s):  Over the next 30 days, patient will verbalize understanding of plan for decreased swelling.  Over the next 30 days, patient will work with provider to address needs  Over the next 30 days, patient will continue to work with Pharmacist. Over the next 30 days, patient will attend all scheduled appointments.  Interventions:  Inter-disciplinary care team collaboration (see longitudinal plan of care) Reviewed medications with patient. Discussed plans with patient for ongoing care management follow up and provided patient with direct contact information for care management team. Collaboration with Pharmacist for review of medications. Pharmacy referral for medication review. Collaborated with BSW for resources. BSW  Referral for resources-completed Collaborated with PCP for GI referral at patient request Care Guide referral for Medicare information Collaborated with Care Guide.  Plan:  Patient will follow up with provider and fill all prescriptions RNCM will follow up with patient within 45  business days. Patient will follow up with Dr. Margarita Rana regarding radiologic studies ordered for patient.

## 2022-07-27 ENCOUNTER — Other Ambulatory Visit (HOSPITAL_COMMUNITY): Payer: Self-pay | Admitting: Emergency Medicine

## 2022-07-27 NOTE — Progress Notes (Signed)
Paramedicine Encounter    Patient ID: [name] [name], female    DOB: [birth year], 62 y.o.   MRN: [id number]   BP 118/78 (BP Location: Left Arm, Patient Position: Standing, Cuff Size: Normal)   Pulse 81   Resp 16   Wt 193 lb 9.6 oz (87.8 kg)   LMP 04/01/2010   SpO2 95%   BMI 33.23 kg/m  Weight yesterday-not taken Last visit weight-196  ATF Ms. Glick A&O  x4, cooking breakfast for her family.  She denies chest pain or SOB.  Lung sounds clear and equal bilat.  No edema noted.  She missed 1 morning dose of meds for the week.  Med box reconciled.  Made a call to [facility] to discuss getting her set up in pill packs.  No one was available to speak with me and someone was to call me back and did not.  I will reach out in the morning to follow up on same. Preparing pt for graduation.   Also assisted pt w/ follow-up with an upcoming appointment with Endocrinologist Dr. March Rummage in [facility] with [place].  This office will mail out appointment info to [name] [name] with map and parking information.  This appointment is not until 08/20/23 @ 2:40.  Home visit complete.    [name], [place] 07/27/2022   Patient Care Team: [name], MD as PCP - General (Family Medicine) Minus Breeding, MD as PCP - Cardiology (Cardiology) Craft, Lorel Monaco, RN as Case Manager [facility], Tami Lin, [place] as Physician Assistant (Cardiology) Ethelda Chick as Social Worker  Patient Active Problem List   Diagnosis Date Noted   Hyperthyroidism 04/23/2022   Acute on chronic heart failure with preserved ejection fraction (HFpEF) ([place]) 09/22/2021   Heart failure ([place]) 09/22/2021   CKD (chronic kidney disease) stage 4, GFR 15-29 ml/min ([place]) 07/03/2021   Acute respiratory failure due to COVID-19 ([place]) 05/06/2021   COVID-19 05/05/2021   CHF exacerbation ([place]) 04/02/2021   Chest pain 04/02/2021   Acute-on-chronic kidney injury ([place]) 04/02/2021   COPD exacerbation ([place]) 12/05/2020    CKD (chronic kidney disease), stage III ([place]) 12/04/2020   Anemia of chronic disease 12/04/2020   Obesity, Class III, BMI 40-49.9 (morbid obesity) ([place]) 07/27/2020   Acute respiratory disease due to COVID-19 virus 07/25/2020   Acute hypoxemic respiratory failure due to COVID-19 ([place]) 07/25/2020   Morbid obesity ([place]) 11/23/2019   Acute respiratory failure with hypoxia ([place]) 11/06/2019   Chronic kidney disease, stage 3b ([place]) 11/06/2019   COPD without exacerbation ([place])    Acute kidney failure ([place]) 08/18/2019   Left ventricular hypertrophy 07/21/2019   Educated about COVID-19 virus infection 81/27/5170   Acute diastolic HF (heart failure) (HCC)    Hypoxia    OSA (obstructive sleep apnea) 05/09/2019   Hyperglycemia 04/09/2019   Snoring 03/13/2019   Chronic anemia 08/17/2018   Chronic cystitis 08/03/2018   Diabetic neuropathy ([place]) 06/16/2017   Non compliance w medication regimen 04/12/2017   Family history of colon cancer in mother 11/17/2016   Dyspareunia in female 11/11/2016   Screen for colon cancer 11/11/2016   History of ovarian cyst 11/11/2016   Chronic hepatitis C without hepatic coma ([place]) 11/09/2016   Hyperlipidemia 10/07/2016   Type 2 diabetes mellitus with hyperlipidemia ([place]) 01/29/2014   Status post intraocular lens implant 11/08/2013   PCO (posterior capsular opacification) 09/28/2013   Pseudophakia 09/12/2013   GERD (gastroesophageal reflux disease) 09/06/2013   Hypertension 09/06/2013   ANEMIA, IRON DEFICIENCY 10/03/2009  LIPOMA 06/18/2009   TINEA PEDIS 05/07/2009   SUBSTANCE ABUSE, MULTIPLE 02/22/2007   HCVD (hypertensive cardiovascular disease) 02/22/2007    Current Outpatient Medications:    albuterol (VENTOLIN HFA) 108 (90 Base) MCG/ACT inhaler, Inhale 1 puff into the lungs every 4 (four) hours as needed for wheezing or shortness of breath., Disp: 18 g, Rfl: 0   amLODipine (NORVASC) 2.5 MG tablet, Take 1 tablet (2.5 mg total) by mouth daily., Disp: 90  tablet, Rfl: 1   atorvastatin (LIPITOR) 40 MG tablet, Take 1 tablet (40 mg total) by mouth every morning., Disp: 90 tablet, Rfl: 1   carvedilol (COREG) 25 MG tablet, TAKE ONE TABLET BY MOUTH twice daily, Disp: 180 tablet, Rfl: 3   empagliflozin (JARDIANCE) 10 MG TABS tablet, Take 1 tablet (10 mg total) by mouth daily., Disp: 90 tablet, Rfl: 1   famotidine (PEPCID) 40 MG tablet, TAKE ONE TABLET BY MOUTH daily, Disp: 30 tablet, Rfl: 3   ferrous sulfate 325 (65 FE) MG tablet, Take 325 mg by mouth daily with breakfast., Disp: , Rfl:    hydrALAZINE (APRESOLINE) 50 MG tablet, Take 1.5 tablets (75 mg total) by mouth 3 (three) times daily., Disp: 135 tablet, Rfl: 8   oxybutynin (DITROPAN) 5 MG tablet, Take 1 tablet (5 mg total) by mouth 2 (two) times daily., Disp: 60 tablet, Rfl: 6   pregabalin (LYRICA) 75 MG capsule, Take 1 capsule (75 mg total) by mouth 2 (two) times daily., Disp: 180 capsule, Rfl: 1   Semaglutide, 1 MG/DOSE, 4 MG/3ML SOPN, Inject 1 mg as directed once a week., Disp: 3 mL, Rfl: 6   spironolactone (ALDACTONE) 25 MG tablet, TAKE 1/2 TABLET BY MOUTH daily, Disp: 30 tablet, Rfl: 3   torsemide (DEMADEX) 20 MG tablet, Take 20 mg alternating with 40 mg daily, Disp: 90 tablet, Rfl: 6   hydrocortisone-pramoxine (ANALPRAM HC) 2.5-1 % rectal cream, Place 1 Application rectally 3 (three) times daily. (Patient not taking: Reported on 06/22/2022), Disp: 30 g, Rfl: 1   methimazole (TAPAZOLE) 5 MG tablet, Take 1 tablet (5 mg total) by mouth daily. (Patient not taking: Reported on 07/06/2022), Disp: 90 tablet, Rfl: 1   mometasone-formoterol (DULERA) 100-5 MCG/ACT AERO, Inhale 2 puffs into the lungs as needed for wheezing. (Patient not taking: Reported on 07/06/2022), Disp: , Rfl:    nystatin ointment (MYCOSTATIN), Apply 1 application. topically 2 (two) times daily as needed (rash/skin irritation). (Patient not taking: Reported on 06/22/2022), Disp: 30 g, Rfl: 1 Allergies  Allergen Reactions   Lisinopril  Swelling      Social History   Socioeconomic History   Marital status: Legally Separated    Spouse name: Not on file   Number of children: 1   Years of education: Not on file   Highest education level: 9th grade  Occupational History   Occupation: disability  Tobacco Use   Smoking status: Never   Smokeless tobacco: Never  Vaping Use   Vaping Use: Never used  Substance and Sexual Activity   Alcohol use: No   Drug use: Not Currently    Types: Cocaine, Marijuana    Comment: remote h/o cocaine 2015 and marijuana 2018   Sexual activity: Not Currently  Other Topics Concern   Not on file  Social History Narrative   Lives with friend, Rosalee Kaufman and nephew.  One daughter.    Social Determinants of Health   Financial Resource Strain: Low Risk  (07/22/2022)   Overall Financial Resource Strain (CARDIA)    Difficulty of  Paying Living Expenses: Not very hard  Food Insecurity: No Food Insecurity (07/02/2022)   Hunger Vital Sign    Worried About Running Out of Food in the Last Year: Never true    Ran Out of Food in the Last Year: Never true  Transportation Needs: No Transportation Needs (07/02/2022)   PRAPARE - Hydrologist (Medical): No    Lack of Transportation (Non-Medical): No  Physical Activity: Insufficiently Active (02/04/2022)   Exercise Vital Sign    Days of Exercise per Week: 1 day    Minutes of Exercise per Session: 30 min  Stress: No Stress Concern Present (04/13/2022)   [place]    Feeling of Stress : Not at all  Social Connections: Moderately Isolated (07/02/2022)   Social Connection and Isolation Panel [NHANES]    Frequency of Communication with Friends and Family: More than three times a week    Frequency of Social Gatherings with Friends and Family: More than three times a week    Attends Religious Services: More than 4 times per year    Active Member of [employer] or  Organizations: No    Attends Archivist Meetings: Never    Marital Status: Divorced  Human resources officer Violence: Not At Risk (07/22/2022)   Humiliation, Afraid, Rape, and Kick questionnaire    Fear of Current or Ex-Partner: No    Emotionally Abused: No    Physically Abused: No    Sexually Abused: No    Physical Exam      Future Appointments  Date Time Provider [place]  09/06/2022 10:10 AM [name], MD [facility] None  09/07/2022 12:30 PM Craft, Lorel Monaco, RN [place] None       [name], [place] Paramedic  07/27/22

## 2022-08-03 ENCOUNTER — Other Ambulatory Visit (HOSPITAL_COMMUNITY): Payer: Self-pay | Admitting: Emergency Medicine

## 2022-08-03 NOTE — Progress Notes (Signed)
Paramedicine Encounter    Patient ID: [name] [name], female    DOB: 21-Apr-1961, 62 y.o.   MRN: [id number]   LMP 04/01/2010  Weight yesterday-not taken Last visit weight-193lb  ATF Ms. Khiev A&O x 4, skin W&D w/ good color.  Pt. Denies chest pain or SOB.  Lung sounds clear and equal bilat.  No edema noted.  She missed 1 a.m. dose and 2 p.m. doses out of last weeks med regimen.  She states she had some family issues that contributed to this and will get back on track.  Meds reconciled x 1 week.  No refills needed at this time.  Home visit complete.    [name], [place] 08/03/2022     Patient Care Team: [name], MD as PCP - General (Family Medicine) Minus Breeding, MD as PCP - Cardiology (Cardiology) Craft, Lorel Monaco, RN as Case Manager [facility], Tami Lin, [employer] as Physician Assistant (Cardiology) Ethelda Chick as Social Worker  Patient Active Problem List   Diagnosis Date Noted   Hyperthyroidism 04/23/2022   Acute on chronic heart failure with preserved ejection fraction (HFpEF) ([place]) 09/22/2021   Heart failure ([place]) 09/22/2021   CKD (chronic kidney disease) stage 4, GFR 15-29 ml/min ([place]) 07/03/2021   Acute respiratory failure due to COVID-19 ([place]) 05/06/2021   COVID-19 05/05/2021   CHF exacerbation ([place]) 04/02/2021   Chest pain 04/02/2021   Acute-on-chronic kidney injury ([place]) 04/02/2021   COPD exacerbation ([place]) 12/05/2020   CKD (chronic kidney disease), stage III ([place]) 12/04/2020   Anemia of chronic disease 12/04/2020   Obesity, Class III, BMI 40-49.9 (morbid obesity) ([place]) 07/27/2020   Acute respiratory disease due to COVID-19 virus 07/25/2020   Acute hypoxemic respiratory failure due to COVID-19 ([place]) 07/25/2020   Morbid obesity ([place]) 11/23/2019   Acute respiratory failure with hypoxia ([place]) 11/06/2019   Chronic kidney disease, stage 3b ([place]) 11/06/2019   COPD without exacerbation ([place])    Acute kidney failure ([place]) 08/18/2019    Left ventricular hypertrophy 07/21/2019   Educated about COVID-19 virus infection 79/89/2119   Acute diastolic HF (heart failure) (HCC)    Hypoxia    OSA (obstructive sleep apnea) 05/09/2019   Hyperglycemia 04/09/2019   Snoring 03/13/2019   Chronic anemia 08/17/2018   Chronic cystitis 08/03/2018   Diabetic neuropathy ([place]) 06/16/2017   Non compliance w medication regimen 04/12/2017   Family history of colon cancer in mother 11/17/2016   Dyspareunia in female 11/11/2016   Screen for colon cancer 11/11/2016   History of ovarian cyst 11/11/2016   Chronic hepatitis C without hepatic coma ([place]) 11/09/2016   Hyperlipidemia 10/07/2016   Type 2 diabetes mellitus with hyperlipidemia ([place]) 01/29/2014   Status post intraocular lens implant 11/08/2013   PCO (posterior capsular opacification) 09/28/2013   Pseudophakia 09/12/2013   GERD (gastroesophageal reflux disease) 09/06/2013   Hypertension 09/06/2013   ANEMIA, IRON DEFICIENCY 10/03/2009   LIPOMA 06/18/2009   TINEA PEDIS 05/07/2009   SUBSTANCE ABUSE, MULTIPLE 02/22/2007   HCVD (hypertensive cardiovascular disease) 02/22/2007    Current Outpatient Medications:    albuterol (VENTOLIN HFA) 108 (90 Base) MCG/ACT inhaler, Inhale 1 puff into the lungs every 4 (four) hours as needed for wheezing or shortness of breath., Disp: 18 g, Rfl: 0   amLODipine (NORVASC) 2.5 MG tablet, Take 1 tablet (2.5 mg total) by mouth daily., Disp: 90 tablet, Rfl: 1   atorvastatin (LIPITOR) 40 MG tablet, Take 1 tablet (40 mg total) by mouth every morning., Disp: 90 tablet, Rfl:  1   carvedilol (COREG) 25 MG tablet, TAKE ONE TABLET BY MOUTH twice daily, Disp: 180 tablet, Rfl: 3   empagliflozin (JARDIANCE) 10 MG TABS tablet, Take 1 tablet (10 mg total) by mouth daily., Disp: 90 tablet, Rfl: 1   famotidine (PEPCID) 40 MG tablet, TAKE ONE TABLET BY MOUTH daily, Disp: 30 tablet, Rfl: 3   ferrous sulfate 325 (65 FE) MG tablet, Take 325 mg by mouth daily with breakfast.,  Disp: , Rfl:    hydrALAZINE (APRESOLINE) 50 MG tablet, Take 1.5 tablets (75 mg total) by mouth 3 (three) times daily., Disp: 135 tablet, Rfl: 8   hydrocortisone-pramoxine (ANALPRAM HC) 2.5-1 % rectal cream, Place 1 Application rectally 3 (three) times daily. (Patient not taking: Reported on 06/22/2022), Disp: 30 g, Rfl: 1   methimazole (TAPAZOLE) 5 MG tablet, Take 1 tablet (5 mg total) by mouth daily. (Patient not taking: Reported on 07/06/2022), Disp: 90 tablet, Rfl: 1   mometasone-formoterol (DULERA) 100-5 MCG/ACT AERO, Inhale 2 puffs into the lungs as needed for wheezing. (Patient not taking: Reported on 07/06/2022), Disp: , Rfl:    nystatin ointment (MYCOSTATIN), Apply 1 application. topically 2 (two) times daily as needed (rash/skin irritation). (Patient not taking: Reported on 06/22/2022), Disp: 30 g, Rfl: 1   oxybutynin (DITROPAN) 5 MG tablet, Take 1 tablet (5 mg total) by mouth 2 (two) times daily., Disp: 60 tablet, Rfl: 6   pregabalin (LYRICA) 75 MG capsule, Take 1 capsule (75 mg total) by mouth 2 (two) times daily., Disp: 180 capsule, Rfl: 1   Semaglutide, 1 MG/DOSE, 4 MG/3ML SOPN, Inject 1 mg as directed once a week., Disp: 3 mL, Rfl: 6   spironolactone (ALDACTONE) 25 MG tablet, TAKE 1/2 TABLET BY MOUTH daily, Disp: 30 tablet, Rfl: 3   torsemide (DEMADEX) 20 MG tablet, Take 20 mg alternating with 40 mg daily, Disp: 90 tablet, Rfl: 6 Allergies  Allergen Reactions   Lisinopril Swelling      Social History   Socioeconomic History   Marital status: Legally Separated    Spouse name: Not on file   Number of children: 1   Years of education: Not on file   Highest education level: 9th grade  Occupational History   Occupation: disability  Tobacco Use   Smoking status: Never   Smokeless tobacco: Never  Vaping Use   Vaping Use: Never used  Substance and Sexual Activity   Alcohol use: No   Drug use: Not Currently    Types: Cocaine, Marijuana    Comment: remote h/o cocaine 2015 and  marijuana 2018   Sexual activity: Not Currently  Other Topics Concern   Not on file  Social History Narrative   Lives with friend, Rosalee Kaufman and nephew.  One daughter.    Social Determinants of Health   Financial Resource Strain: Low Risk  (07/22/2022)   Overall Financial Resource Strain (CARDIA)    Difficulty of Paying Living Expenses: Not very hard  Food Insecurity: No Food Insecurity (07/02/2022)   Hunger Vital Sign    Worried About Running Out of Food in the Last Year: Never true    Ran Out of Food in the Last Year: Never true  Transportation Needs: No Transportation Needs (07/02/2022)   PRAPARE - Hydrologist (Medical): No    Lack of Transportation (Non-Medical): No  Physical Activity: Insufficiently Active (02/04/2022)   Exercise Vital Sign    Days of Exercise per Week: 1 day    Minutes of Exercise per  Session: 30 min  Stress: No Stress Concern Present (04/13/2022)   [place]    Feeling of Stress : Not at all  Social Connections: Moderately Isolated (07/02/2022)   Social Connection and Isolation Panel [NHANES]    Frequency of Communication with Friends and Family: More than three times a week    Frequency of Social Gatherings with Friends and Family: More than three times a week    Attends Religious Services: More than 4 times per year    Active Member of [employer] or Organizations: No    Attends Archivist Meetings: Never    Marital Status: Divorced  Human resources officer Violence: Not At Risk (07/22/2022)   Humiliation, Afraid, Rape, and Kick questionnaire    Fear of Current or Ex-Partner: No    Emotionally Abused: No    Physically Abused: No    Sexually Abused: No    Physical Exam      Future Appointments  Date Time Provider [place]  09/06/2022 10:10 AM [name], MD [facility] None  09/07/2022 12:30 PM Craft, Lorel Monaco, RN [place] None       [name], [place] Paramedic  08/03/22

## 2022-08-07 ENCOUNTER — Encounter (HOSPITAL_COMMUNITY): Payer: Self-pay

## 2022-08-10 ENCOUNTER — Other Ambulatory Visit (HOSPITAL_COMMUNITY): Payer: Self-pay | Admitting: Emergency Medicine

## 2022-08-10 NOTE — Progress Notes (Signed)
Paramedicine Encounter    Patient ID: [name] [name], female    DOB: 24-Aug-1960, 62 y.o.   MRN: [id number]   BP 122/78 (BP Location: Left Arm, Patient Position: Sitting, Cuff Size: Normal)   Pulse 72   Wt 192 lb 3.2 oz (87.2 kg)   LMP 04/01/2010   SpO2 98%   BMI 32.99 kg/m  Weight yesterday-not taken Last visit weight-190lb  ATF Ms. Mohs A&O x 4, skin W&D w/ good color.  Pt. Denies chest pain or SOB.  Lung sounds clear and equal bilat.  She missed only 1 p.m. dose out of her week med regimen.  Med box reconciled w/o incident. Graduation from paramedicine program.   Patient Care Team: [name], MD as PCP - General (Family Medicine) Minus Breeding, MD as PCP - Cardiology (Cardiology) Craft, Lorel Monaco, RN as Case Manager [place], Tami Lin, [place] as Physician Assistant (Cardiology) Ethelda Chick as Social Worker  Patient Active Problem List   Diagnosis Date Noted   Hyperthyroidism 04/23/2022   Acute on chronic heart failure with preserved ejection fraction (HFpEF) ([place]) 09/22/2021   Heart failure ([place]) 09/22/2021   CKD (chronic kidney disease) stage 4, GFR 15-29 ml/min ([place]) 07/03/2021   Acute respiratory failure due to COVID-19 ([place]) 05/06/2021   COVID-19 05/05/2021   CHF exacerbation ([place]) 04/02/2021   Chest pain 04/02/2021   Acute-on-chronic kidney injury ([place]) 04/02/2021   COPD exacerbation ([place]) 12/05/2020   CKD (chronic kidney disease), stage III ([place]) 12/04/2020   Anemia of chronic disease 12/04/2020   Obesity, Class III, BMI 40-49.9 (morbid obesity) ([place]) 07/27/2020   Acute respiratory disease due to COVID-19 virus 07/25/2020   Acute hypoxemic respiratory failure due to COVID-19 ([place]) 07/25/2020   Morbid obesity ([place]) 11/23/2019   Acute respiratory failure with hypoxia ([place]) 11/06/2019   Chronic kidney disease, stage 3b ([place]) 11/06/2019   COPD without exacerbation ([place])    Acute kidney failure ([place]) 08/18/2019   Left ventricular hypertrophy 07/21/2019    Educated about COVID-19 virus infection 34/74/2595   Acute diastolic HF (heart failure) (HCC)    Hypoxia    OSA (obstructive sleep apnea) 05/09/2019   Hyperglycemia 04/09/2019   Snoring 03/13/2019   Chronic anemia 08/17/2018   Chronic cystitis 08/03/2018   Diabetic neuropathy ([place]) 06/16/2017   Non compliance w medication regimen 04/12/2017   Family history of colon cancer in mother 11/17/2016   Dyspareunia in female 11/11/2016   Screen for colon cancer 11/11/2016   History of ovarian cyst 11/11/2016   Chronic hepatitis C without hepatic coma ([place]) 11/09/2016   Hyperlipidemia 10/07/2016   Type 2 diabetes mellitus with hyperlipidemia ([place]) 01/29/2014   Status post intraocular lens implant 11/08/2013   PCO (posterior capsular opacification) 09/28/2013   Pseudophakia 09/12/2013   GERD (gastroesophageal reflux disease) 09/06/2013   Hypertension 09/06/2013   ANEMIA, IRON DEFICIENCY 10/03/2009   LIPOMA 06/18/2009   TINEA PEDIS 05/07/2009   SUBSTANCE ABUSE, MULTIPLE 02/22/2007   HCVD (hypertensive cardiovascular disease) 02/22/2007    Current Outpatient Medications:    albuterol (VENTOLIN HFA) 108 (90 Base) MCG/ACT inhaler, Inhale 1 puff into the lungs every 4 (four) hours as needed for wheezing or shortness of breath., Disp: 18 g, Rfl: 0   amLODipine (NORVASC) 2.5 MG tablet, Take 1 tablet (2.5 mg total) by mouth daily., Disp: 90 tablet, Rfl: 1   atorvastatin (LIPITOR) 40 MG tablet, Take 1 tablet (40 mg total) by mouth every morning., Disp: 90 tablet, Rfl: 1   carvedilol (COREG) 25  MG tablet, TAKE ONE TABLET BY MOUTH twice daily, Disp: 180 tablet, Rfl: 3   empagliflozin (JARDIANCE) 10 MG TABS tablet, Take 1 tablet (10 mg total) by mouth daily., Disp: 90 tablet, Rfl: 1   famotidine (PEPCID) 40 MG tablet, TAKE ONE TABLET BY MOUTH daily, Disp: 30 tablet, Rfl: 3   ferrous sulfate 325 (65 FE) MG tablet, Take 325 mg by mouth daily with breakfast., Disp: , Rfl:    hydrALAZINE (APRESOLINE)  50 MG tablet, Take 1.5 tablets (75 mg total) by mouth 3 (three) times daily., Disp: 135 tablet, Rfl: 8   nystatin ointment (MYCOSTATIN), Apply 1 application. topically 2 (two) times daily as needed (rash/skin irritation)., Disp: 30 g, Rfl: 1   oxybutynin (DITROPAN) 5 MG tablet, Take 1 tablet (5 mg total) by mouth 2 (two) times daily., Disp: 60 tablet, Rfl: 6   pregabalin (LYRICA) 75 MG capsule, Take 1 capsule (75 mg total) by mouth 2 (two) times daily., Disp: 180 capsule, Rfl: 1   Semaglutide, 1 MG/DOSE, 4 MG/3ML SOPN, Inject 1 mg as directed once a week., Disp: 3 mL, Rfl: 6   spironolactone (ALDACTONE) 25 MG tablet, TAKE 1/2 TABLET BY MOUTH daily, Disp: 30 tablet, Rfl: 3   torsemide (DEMADEX) 20 MG tablet, Take 20 mg alternating with 40 mg daily, Disp: 90 tablet, Rfl: 6   hydrocortisone-pramoxine (ANALPRAM HC) 2.5-1 % rectal cream, Place 1 Application rectally 3 (three) times daily. (Patient not taking: Reported on 06/22/2022), Disp: 30 g, Rfl: 1   methimazole (TAPAZOLE) 5 MG tablet, Take 1 tablet (5 mg total) by mouth daily. (Patient not taking: Reported on 08/03/2022), Disp: 90 tablet, Rfl: 1   mometasone-formoterol (DULERA) 100-5 MCG/ACT AERO, Inhale 2 puffs into the lungs as needed for wheezing. (Patient not taking: Reported on 07/06/2022), Disp: , Rfl:  Allergies  Allergen Reactions   Lisinopril Swelling      Social History   Socioeconomic History   Marital status: Legally Separated    Spouse name: Not on file   Number of children: 1   Years of education: Not on file   Highest education level: 9th grade  Occupational History   Occupation: disability  Tobacco Use   Smoking status: Never   Smokeless tobacco: Never  Vaping Use   Vaping Use: Never used  Substance and Sexual Activity   Alcohol use: No   Drug use: Not Currently    Types: Cocaine, Marijuana    Comment: remote h/o cocaine 2015 and marijuana 2018   Sexual activity: Not Currently  Other Topics Concern   Not on file   Social History Narrative   Lives with friend, Rosalee Kaufman and nephew.  One daughter.    Social Determinants of Health   Financial Resource Strain: Low Risk  (07/22/2022)   Overall Financial Resource Strain (CARDIA)    Difficulty of Paying Living Expenses: Not very hard  Food Insecurity: No Food Insecurity (07/02/2022)   Hunger Vital Sign    Worried About Running Out of Food in the Last Year: Never true    Ran Out of Food in the Last Year: Never true  Transportation Needs: No Transportation Needs (07/02/2022)   PRAPARE - Hydrologist (Medical): No    Lack of Transportation (Non-Medical): No  Physical Activity: Insufficiently Active (02/04/2022)   Exercise Vital Sign    Days of Exercise per Week: 1 day    Minutes of Exercise per Session: 30 min  Stress: No Stress Concern Present (04/13/2022)  [employer] - Occupational Stress Questionnaire    Feeling of Stress : Not at all  Social Connections: Moderately Isolated (07/02/2022)   Social Connection and Isolation Panel [NHANES]    Frequency of Communication with Friends and Family: More than three times a week    Frequency of Social Gatherings with Friends and Family: More than three times a week    Attends Religious Services: More than 4 times per year    Active Member of [employer] or Organizations: No    Attends Archivist Meetings: Never    Marital Status: Divorced  Human resources officer Violence: Not At Risk (07/22/2022)   Humiliation, Afraid, Rape, and Kick questionnaire    Fear of Current or Ex-Partner: No    Emotionally Abused: No    Physically Abused: No    Sexually Abused: No    Physical Exam  Patient is now discharged from [employer] program.  Patient has/has not met the following goals:  Yes :Patient expresses basic understanding of medications and what they are for Yes :Patient able to verbalize heart failure specific dietary/fluid restrictions Yes  :Patient is aware of who to call if they have medical concerns or if they need to schedule or change appts Yes :Patient has a scale for daily weights and weighs regularly Yes :Patient able to verbalize concerning symptoms when they should call the HF clinic (weight gain ranges, etc) Yes :Patient has a PCP and has seen within the past year or has upcoming appt Yes :Patient has reliable access to getting their medications Yes :Patient has shown they are able to reorder medications reliably No :Patient has had admission in past 30 days- if yes how many? No :Patient has had admission in past 90 days- if yes how many?  Discharge Comments: Ms. Rabelo has done well with the paramedicine program.  She has become independent in filling her medicines and calling for refills.  She demonstrates a basic understanding of her heart failure and what her meds are fore.  She is ready for graduation at this time.  Her next refills on her meds are not due till middle of February.  Her plan is to switch over to [employer] and move to bubble packs and have meds delivered to her home.  Her co-pays will be waived since she is medicaid - a nice cost savings for her.  Advised her to continue on her current path of med compliance and keeping her scheduled appointments.  She is welcome to call me should she have questions in the future.  Also reminded her to listen to her body and should she have an emergency to call 911 if needed.    [name], [place] 08/10/2022        Future Appointments  Date Time Provider [place]  09/06/2022 10:10 AM [name], MD [id number] None  09/07/2022 12:30 PM Craft, Lorel Monaco, RN [place] None       [name], [place] Paramedic  08/10/22

## 2022-08-19 DIAGNOSIS — E0590 Thyrotoxicosis, unspecified without thyrotoxic crisis or storm: Secondary | ICD-10-CM | POA: Diagnosis not present

## 2022-08-19 DIAGNOSIS — E0500 Thyrotoxicosis with diffuse goiter without thyrotoxic crisis or storm: Secondary | ICD-10-CM | POA: Diagnosis not present

## 2022-09-06 ENCOUNTER — Ambulatory Visit: Payer: 59 | Attending: Family Medicine | Admitting: Family Medicine

## 2022-09-06 VITALS — BP 133/83 | HR 76 | Temp 98.2°F | Wt 198.4 lb

## 2022-09-06 DIAGNOSIS — D249 Benign neoplasm of unspecified breast: Secondary | ICD-10-CM

## 2022-09-06 DIAGNOSIS — E1122 Type 2 diabetes mellitus with diabetic chronic kidney disease: Secondary | ICD-10-CM

## 2022-09-06 DIAGNOSIS — Z794 Long term (current) use of insulin: Secondary | ICD-10-CM

## 2022-09-06 DIAGNOSIS — I5032 Chronic diastolic (congestive) heart failure: Secondary | ICD-10-CM | POA: Diagnosis not present

## 2022-09-06 DIAGNOSIS — E1149 Type 2 diabetes mellitus with other diabetic neurological complication: Secondary | ICD-10-CM

## 2022-09-06 DIAGNOSIS — I110 Hypertensive heart disease with heart failure: Secondary | ICD-10-CM | POA: Diagnosis not present

## 2022-09-06 DIAGNOSIS — E0500 Thyrotoxicosis with diffuse goiter without thyrotoxic crisis or storm: Secondary | ICD-10-CM | POA: Diagnosis not present

## 2022-09-06 DIAGNOSIS — E11311 Type 2 diabetes mellitus with unspecified diabetic retinopathy with macular edema: Secondary | ICD-10-CM

## 2022-09-06 DIAGNOSIS — N184 Chronic kidney disease, stage 4 (severe): Secondary | ICD-10-CM

## 2022-09-06 DIAGNOSIS — I129 Hypertensive chronic kidney disease with stage 1 through stage 4 chronic kidney disease, or unspecified chronic kidney disease: Secondary | ICD-10-CM | POA: Diagnosis not present

## 2022-09-06 DIAGNOSIS — J449 Chronic obstructive pulmonary disease, unspecified: Secondary | ICD-10-CM

## 2022-09-06 LAB — POCT GLYCOSYLATED HEMOGLOBIN (HGB A1C): HbA1c, POC (controlled diabetic range): 6.5 % (ref 0.0–7.0)

## 2022-09-06 LAB — GLUCOSE, POCT (MANUAL RESULT ENTRY): POC Glucose: 129 mg/dl — AB (ref 70–99)

## 2022-09-06 NOTE — Patient Instructions (Signed)
Exercising to Stay Healthy To become healthy and stay healthy, it is recommended that you do moderate-intensity and vigorous-intensity exercise. You can tell that you are exercising at a moderate intensity if your heart starts beating faster and you start breathing faster but can still hold a conversation. You can tell that you are exercising at a vigorous intensity if you are breathing much harder and faster and cannot hold a conversation while exercising. How can exercise benefit me? Exercising regularly is important. It has many health benefits, such as: Improving overall fitness, flexibility, and endurance. Increasing bone density. Helping with weight control. Decreasing body fat. Increasing muscle strength and endurance. Reducing stress and tension, anxiety, depression, or anger. Improving overall health. What guidelines should I follow while exercising? Before you start a new exercise program, talk with your health care provider. Do not exercise so much that you hurt yourself, feel dizzy, or get very short of breath. Wear comfortable clothes and wear shoes with good support. Drink plenty of water while you exercise to prevent dehydration or heat stroke. Work out until your breathing and your heartbeat get faster (moderate intensity). How often should I exercise? Choose an activity that you enjoy, and set realistic goals. Your health care provider can help you make an activity plan that is individually designed and works best for you. Exercise regularly as told by your health care provider. This may include: Doing strength training two times a week, such as: Lifting weights. Using resistance bands. Push-ups. Sit-ups. Yoga. Doing a certain intensity of exercise for a given amount of time. Choose from these options: A total of 150 minutes of moderate-intensity exercise every week. A total of 75 minutes of vigorous-intensity exercise every week. A mix of moderate-intensity and  vigorous-intensity exercise every week. Children, pregnant women, people who have not exercised regularly, people who are overweight, and older adults may need to talk with a health care provider about what activities are safe to perform. If you have a medical condition, be sure to talk with your health care provider before you start a new exercise program. What are some exercise ideas? Moderate-intensity exercise ideas include: Walking 1 mile (1.6 km) in about 15 minutes. Biking. Hiking. Golfing. Dancing. Water aerobics. Vigorous-intensity exercise ideas include: Walking 4.5 miles (7.2 km) or more in about 1 hour. Jogging or running 5 miles (8 km) in about 1 hour. Biking 10 miles (16.1 km) or more in about 1 hour. Lap swimming. Roller-skating or in-line skating. Cross-country skiing. Vigorous competitive sports, such as football, basketball, and soccer. Jumping rope. Aerobic dancing. What are some everyday activities that can help me get exercise? Yard work, such as: Pushing a lawn mower. Raking and bagging leaves. Washing your car. Pushing a stroller. Shoveling snow. Gardening. Washing windows or floors. How can I be more active in my day-to-day activities? Use stairs instead of an elevator. Take a walk during your lunch break. If you drive, park your car farther away from your work or school. If you take public transportation, get off one stop early and walk the rest of the way. Stand up or walk around during all of your indoor phone calls. Get up, stretch, and walk around every 30 minutes throughout the day. Enjoy exercise with a friend. Support to continue exercising will help you keep a regular routine of activity. Where to find more information You can find more information about exercising to stay healthy from: U.[place] Department of Health and Human Services: www.hhs.gov Centers for Disease Control and Prevention (  CDC): www.cdc.gov Summary Exercising regularly is  important. It will improve your overall fitness, flexibility, and endurance. Regular exercise will also improve your overall health. It can help you control your weight, reduce stress, and improve your bone density. Do not exercise so much that you hurt yourself, feel dizzy, or get very short of breath. Before you start a new exercise program, talk with your health care provider. This information is not intended to replace advice given to you by your health care provider. Make sure you discuss any questions you have with your health care provider. Document Revised: 10/31/2020 Document Reviewed: 10/31/2020 Elsevier Patient Education  2023 [employer]  

## 2022-09-06 NOTE — Progress Notes (Signed)
Subjective:  Patient ID: [name] [name], female    DOB: 07/22/1960  Age: 62 y.o. MRN: [id number]  CC: Hypertension   HPI [name] [name] is a 62 y.o. year old female with a history of type 2 diabetes mellitus (A1c 7.5), diabetic neuropathy, hypertension, diabetic retinopathy, hyperlipidemia, hepatitis C (completed treatment with harvoni), OSA, diastolic CHF (EF 60 to [id number] from echo of 09/2021), stage III CKD, hyperthyroidism.   Interval History:  She had a mammogram which had revealed bilateral breast masses and subsequently had a follow-up ultrasound and biopsy of the masses. Ultrasound breast: ADDENDUM REPORT: 06/21/2022 08:13   ADDENDUM: Pathology revealed FAT NECROSIS, WITH ASSOCIATED FIBROSIS of the RIGHT breast, 12 o'clock (ribbon clip). This was found to be concordant by Dr. Nolon Nations.   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 This was found to be concordant by Dr. Nolon Nations, with surgical consultation for excision recommended per [facility] Protocol.    She states that the general surgeons office she was referred to does not take her insurance and she has not heard anything else after that.   Closely followed by endocrinology for management of her Graves' disease. She is doing well on her diabetic regimen with even zero 6.5 down from 7.5 previously.  She has no hypoglycemic adverse effects. From a cardiac standpoint she is doing well and has only mild dyspnea on moderate exertion but has no pedal edema. She is scheduled for a cardiac perfusion PET/CT later on in the month. She denies any additional concerns. 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   Qualifier: Diagnosis of  By: Jorene Minors, [place]     Hypertension     MRSA (methicillin resistant Staphylococcus aureus)    Trichomonas    VAGINITIS, BACTERIAL, RECURRENT 08/15/2007   Qualifier: Diagnosis of  By: Radene Ou MD, [place]      Past Surgical History:  Procedure Laterality Date   BREAST BIOPSY Left 2018   BREAST BIOPSY Left 06/08/2022   BREAST BIOPSY Right 06/08/2022   BREAST BIOPSY Right 06/08/2022   [place] RT BREAST BX W LOC DEV 1ST LESION IMG BX SPEC US GUIDE 06/08/2022 [facility]   BREAST BIOPSY Left 06/08/2022   [place] LT BREAST BX W LOC DEV 1ST LESION IMG BX SPEC US GUIDE 06/08/2022 [facility]   CESAREAN SECTION     breech    Family History  Problem Relation Age of Onset   Colon cancer Mother    Liver disease Sister    Other Neg Hx    Breast cancer Neg Hx    Esophageal cancer Neg Hx    Rectal cancer Neg Hx    Tremor Neg Hx     Social History   Socioeconomic History   Marital status: Legally Separated    Spouse name: Not on file   Number of children: 1   Years of education: Not on file   Highest education level: 9th grade  Occupational History   Occupation: disability  Tobacco Use   Smoking status: Never   Smokeless tobacco: Never  Vaping Use   Vaping Use: Never used  Substance and Sexual Activity   Alcohol use: No   Drug use: Not Currently    Types: Cocaine, Marijuana  Comment: remote h/o cocaine 2015 and marijuana 2018   Sexual activity: Not Currently  Other Topics Concern   Not on file  Social History Narrative   Lives with friend, Rosalee Kaufman and nephew.  One daughter.    Social Determinants of Health   Financial Resource Strain: Low Risk  (07/22/2022)   Overall Financial Resource Strain (CARDIA)    Difficulty of Paying Living Expenses: Not very hard  Food Insecurity: No Food Insecurity (07/02/2022)   Hunger Vital Sign    Worried About Running Out of Food in the Last Year: Never true    Ran Out of Food in the Last Year: Never true  Transportation Needs: No Transportation Needs (07/02/2022)   PRAPARE  - Hydrologist (Medical): No    Lack of Transportation (Non-Medical): No  Physical Activity: Insufficiently Active (02/04/2022)   Exercise Vital Sign    Days of Exercise per Week: 1 day    Minutes of Exercise per Session: 30 min  Stress: No Stress Concern Present (04/13/2022)   [place]    Feeling of Stress : Not at all  Social Connections: Moderately Isolated (07/02/2022)   Social Connection and Isolation Panel [NHANES]    Frequency of Communication with Friends and Family: More than three times a week    Frequency of Social Gatherings with Friends and Family: More than three times a week    Attends Religious Services: More than 4 times per year    Active Member of [employer] or Organizations: No    Attends Archivist Meetings: Never    Marital Status: Divorced    Allergies  Allergen Reactions   Lisinopril Swelling    Outpatient Medications Prior to Visit  Medication Sig Dispense Refill   albuterol (VENTOLIN HFA) 108 (90 Base) MCG/ACT inhaler Inhale 1 puff into the lungs every 4 (four) hours as needed for wheezing or shortness of breath. 18 g 0   amLODipine (NORVASC) 2.5 MG tablet Take 1 tablet (2.5 mg total) by mouth daily. 90 tablet 1   atorvastatin (LIPITOR) 40 MG tablet Take 1 tablet (40 mg total) by mouth every morning. 90 tablet 1   carvedilol (COREG) 25 MG tablet TAKE ONE TABLET BY MOUTH twice daily 180 tablet 3   empagliflozin (JARDIANCE) 10 MG TABS tablet Take 1 tablet (10 mg total) by mouth daily. 90 tablet 1   famotidine (PEPCID) 40 MG tablet TAKE ONE TABLET BY MOUTH daily 30 tablet 3   ferrous sulfate 325 (65 FE) MG tablet Take 325 mg by mouth daily with breakfast.     hydrALAZINE (APRESOLINE) 50 MG tablet Take 1.5 tablets (75 mg total) by mouth 3 (three) times daily. 135 tablet 8   hydrocortisone-pramoxine (ANALPRAM HC) 2.5-1 % rectal cream Place 1 Application rectally 3  (three) times daily. 30 g 1   methimazole (TAPAZOLE) 5 MG tablet Take 1 tablet (5 mg total) by mouth daily. 90 tablet 1   mometasone-formoterol (DULERA) 100-5 MCG/ACT AERO Inhale 2 puffs into the lungs as needed for wheezing.     nystatin ointment (MYCOSTATIN) Apply 1 application. topically 2 (two) times daily as needed (rash/skin irritation). 30 g 1   oxybutynin (DITROPAN) 5 MG tablet Take 1 tablet (5 mg total) by mouth 2 (two) times daily. 60 tablet 6   pregabalin (LYRICA) 75 MG capsule Take 1 capsule (75 mg total) by mouth 2 (two) times daily. 180 capsule 1   Semaglutide, 1 MG/DOSE,  4 MG/3ML SOPN Inject 1 mg as directed once a week. 3 mL 6   spironolactone (ALDACTONE) 25 MG tablet TAKE 1/2 TABLET BY MOUTH daily 30 tablet 3   torsemide (DEMADEX) 20 MG tablet Take 20 mg alternating with 40 mg daily 90 tablet 6   No facility-administered medications prior to visit.     ROS Review of Systems  Constitutional:  Negative for activity change and appetite change.  HENT:  Negative for sinus pressure and sore throat.   Respiratory:  Positive for shortness of breath. Negative for chest tightness and wheezing.   Cardiovascular:  Negative for chest pain and palpitations.  Gastrointestinal:  Negative for abdominal distention, abdominal pain and constipation.  Genitourinary: Negative.   Musculoskeletal: Negative.   Psychiatric/Behavioral:  Negative for behavioral problems and dysphoric mood.     Objective:  BP 133/83   Pulse 76   Temp 98.2 F (36.8 C) (Oral)   Wt 198 lb 6.4 oz (90 kg)   LMP 04/01/2010   SpO2 95%   BMI 34.06 kg/m      09/06/2022   10:43 AM 08/10/2022    1:17 PM 08/03/2022   11:39 AM  BP/Weight  Systolic BP [id number]  Diastolic BP 83 78 80  Wt. (Lbs) 198.4 192.2 190.8  BMI 34.06 kg/m2 32.99 kg/m2 32.75 kg/m2    Wt Readings from Last 3 Encounters:  09/06/22 198 lb 6.4 oz (90 kg)  08/10/22 192 lb 3.2 oz (87.2 kg)  08/03/22 190 lb 12.8 oz (86.5 kg)      Physical  Exam Constitutional:      Appearance: She is well-developed.  Cardiovascular:     Rate and Rhythm: Normal rate.     Heart sounds: Normal heart sounds. No murmur heard. Pulmonary:     Effort: Pulmonary effort is normal.     Breath sounds: Normal breath sounds. No wheezing or rales.  Chest:     Chest wall: No tenderness.  Abdominal:     General: Bowel sounds are normal. There is no distension.     Palpations: Abdomen is soft. There is no mass.     Tenderness: There is no abdominal tenderness.  Musculoskeletal:        General: Normal range of motion.     Right lower leg: No edema.     Left lower leg: No edema.  Neurological:     Mental Status: She is alert and oriented to person, place, and time.  Psychiatric:        Mood and Affect: Mood normal.        Latest Ref Rng & Units 07/01/2022   11:09 AM 06/16/2022   11:09 AM 03/29/2022    9:39 AM  CMP  Glucose 70 - 99 mg/dL 176  112  161   BUN 8 - 23 mg/dL 43  35  40   Creatinine 0.44 - 1.00 mg/dL 2.09  2.06  1.91   Sodium 135 - 145 mmol/L 143  143  141   Potassium 3.5 - 5.1 mmol/L 4.0  4.0  4.1   Chloride 98 - 111 mmol/L 111  111  111   CO2 22 - 32 mmol/L 22  24  26   $ Calcium 8.9 - 10.3 mg/dL 9.1  9.0  9.4     Lipid Panel     Component Value Date/Time   CHOL 113 02/10/2022 1043   TRIG 81 02/10/2022 1043   HDL 52 02/10/2022 1043   CHOLHDL 2.5 01/29/2019 1118  CHOLHDL 3 06/18/2014 0951   VLDL 19.6 06/18/2014 0951   LDLCALC 45 02/10/2022 1043    CBC    Component Value Date/Time   WBC 3.6 (L) 09/25/2021 0315   RBC 3.11 (L) 09/25/2021 0315   HGB 8.0 (L) 09/25/2021 0315   HGB 9.8 (L) 07/10/2019 1544   HCT 26.3 (L) 09/25/2021 0315   HCT 30.9 (L) 07/10/2019 1544   PLT 168 09/25/2021 0315   PLT 171 07/10/2019 1544   MCV 84.6 09/25/2021 0315   MCV 86 07/10/2019 1544   MCH 25.7 (L) 09/25/2021 0315   MCHC 30.4 09/25/2021 0315   RDW 13.8 09/25/2021 0315   RDW 12.7 07/10/2019 1544   LYMPHSABS 1.0 09/22/2021 1209    LYMPHSABS 1.3 07/10/2019 1544   MONOABS 0.4 09/22/2021 1209   EOSABS 0.0 09/22/2021 1209   EOSABS 0.0 07/10/2019 1544   BASOSABS 0.0 09/22/2021 1209   BASOSABS 0.0 07/10/2019 1544    Lab Results  Component Value Date   HGBA1C 6.5 09/06/2022    Assessment & Plan:  1. Type 2 diabetes mellitus with other neurologic complication, with long-term current use of insulin (HCC) Controlled with A1c of 6.5 Continue current regimen Counseled on Diabetic diet, my plate method, [id number] minutes of moderate intensity exercise/week Blood sugar logs with fasting goals of 80-120 mg/dl, random of less than 180 and in the event of sugars less than 60 mg/dl or greater than 400 mg/dl encouraged to notify the clinic. Advised on the need for annual eye exams, annual foot exams, Pneumonia vaccine. - POCT glucose (manual entry) - POCT glycosylated hemoglobin (Hb A1C) - Microalbumin/Creatinine Ratio, Urine - LP+Non-HDL Cholesterol  2. Intraductal papilloma of breast, unspecified laterality - Ambulatory referral to General Surgery  3. 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 ([place]) Controlled Continue current antihypertensives Counseled on blood pressure goal of less than 130/80, low-sodium, DASH diet, medication compliance, 150 minutes of moderate intensity exercise per week. Discussed medication compliance, adverse effects.  4. Hypertensive heart disease with chronic diastolic congestive heart failure ([place]) EF 60 to 65% from echo 09/2021 NYHA II Continue beta-blocker, SGLT2i, Keep appointment for cardiac CTA Continue with cardiology  5. Graves disease Ophthalmopathy has resolved Continue Tapazole Follow-up with endocrine  6. 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 Chronic obstructive pulmonary  disease, unspecified COPD type ([place]) Stable with no flares Continue Dulera  8. CKD (chronic kidney disease) stage 4, GFR 15-29 ml/min (HCC) Combination of hypertensive and diabetic nephropathy Follow-up with nephrology    No orders of the defined types were placed in this encounter.   Follow-up: Return in about 3 months (around 12/05/2022) for Chronic medical conditions.       [name], MD, FAAFP. [facility] [place], [place]   09/06/2022, 5:01 PM

## 2022-09-07 ENCOUNTER — Other Ambulatory Visit: Payer: Medicare Other | Admitting: Obstetrics and Gynecology

## 2022-09-07 LAB — MICROALBUMIN / CREATININE URINE RATIO
Creatinine, Urine: 42.3 mg/dL
Microalb/Creat Ratio: 239 mg/g creat — ABNORMAL HIGH (ref 0–29)
Microalbumin, Urine: 101 ug/mL

## 2022-09-07 LAB — LP+NON-HDL CHOLESTEROL
Cholesterol, Total: 148 mg/dL (ref 100–199)
HDL: 78 mg/dL (ref >39)
LDL Chol Calc (NIH): 55 mg/dL (ref 0–99)
Total Non-HDL-Chol (LDL+VLDL): 70 mg/dL (ref 0–129)
Triglycerides: 77 mg/dL (ref 0–149)
VLDL Cholesterol Cal: 15 mg/dL (ref 5–40)

## 2022-09-07 NOTE — Patient Instructions (Signed)
Hi Ms. Fetty to have missed you today - as a part of your Medicaid benefit, you are eligible for care management and care coordination services at no cost or copay. I was unable to reach you by phone today but would be happy to help you with your health related needs. Please feel free to call me at [phone].   A member of the Managed Medicaid care management team will reach out to you again over the next 30 business  days.   [name] RN, BSN [place]  [facility] - Managed Medicaid High Risk [phone]

## 2022-09-07 NOTE — Patient Outreach (Signed)
  Medicaid Managed Care   Unsuccessful Attempt Note   09/07/2022 Name: [name] [name] MRN: [id number] DOB: 1960-09-10  Referred by: [name], MD Reason for referral : High Risk Managed Medicaid (Unsuccessful telephone outreach)  An unsuccessful telephone outreach was attempted today. The patient was referred to the case management team for assistance with care management and care coordination.    Follow Up Plan: The Managed Medicaid care management team will reach out to the patient again over the next 30 business  days. and The  Patient has been provided with contact information for the Managed Medicaid care management team and has been advised to call with any health related questions or concerns.    [name] RN, BSN [place]  [facility] - Managed Medicaid High Risk [phone]

## 2022-09-13 ENCOUNTER — Other Ambulatory Visit: Payer: Self-pay | Admitting: Family Medicine

## 2022-09-13 ENCOUNTER — Telehealth (HOSPITAL_COMMUNITY): Payer: Self-pay | Admitting: Emergency Medicine

## 2022-09-13 ENCOUNTER — Telehealth (HOSPITAL_COMMUNITY): Payer: Self-pay | Admitting: *Deleted

## 2022-09-13 DIAGNOSIS — E1149 Type 2 diabetes mellitus with other diabetic neurological complication: Secondary | ICD-10-CM

## 2022-09-13 DIAGNOSIS — N3946 Mixed incontinence: Secondary | ICD-10-CM

## 2022-09-13 DIAGNOSIS — R079 Chest pain, unspecified: Secondary | ICD-10-CM

## 2022-09-13 NOTE — Telephone Encounter (Signed)
Medication Refill - Medication: Pregablin 75 mg  Has the patient contacted their pharmacy? Yes.   (Agent: If no, request that the patient contact the pharmacy for the refill. If patient does not wish to contact the pharmacy document the reason why and proceed with request.) (Agent: If yes, when and what did the pharmacy advise?)  Preferred Pharmacy (with phone number or street name): [facility] Has the patient been seen for an appointment in the last year OR does the patient have an upcoming appointment? Yes.    Agent: Please be advised that RX refills may take up to 3 business days. We ask that you follow-up with your pharmacy.

## 2022-09-13 NOTE — Telephone Encounter (Signed)
Attestation for stress test.

## 2022-09-13 NOTE — Telephone Encounter (Signed)
Requested medication (s) are due for refill today - no  Requested medication (s) are on the active medication list -yes  Future visit scheduled -yes  Last refill: 05/31/22 #180 1RF  Notes to clinic: non delegated Rx  Requested Prescriptions  Pending Prescriptions Disp Refills   pregabalin (LYRICA) 75 MG capsule 180 capsule 1    Sig: Take 1 capsule (75 mg total) by mouth 2 (two) times daily.     Not Delegated - Neurology:  Anticonvulsants - Controlled - pregabalin Failed - 09/13/2022 11:08 AM      Failed - This refill cannot be delegated      Failed - Cr in normal range and within 360 days    Creat  Date Value Ref Range Status  12/12/2012 0.88 0.50 - 1.10 mg/dL Final   Creatinine, Ser  Date Value Ref Range Status  07/01/2022 2.09 (H) 0.44 - 1.00 mg/dL Final   Creatinine,U  Date Value Ref Range Status  06/13/2007 45.3 mg/dL Final    Comment:    See lab report for associated comment(s)   Creatinine, Urine  Date Value Ref Range Status  09/15/2021 47 mg/dL Final    Comment:    Performed at [facility], [address] [place], [place] [address]         Passed - Completed PHQ-2 or PHQ-9 in the last 360 days      Passed - Valid encounter within last 12 months    Recent Outpatient Visits           1 week ago Type 2 diabetes mellitus with other neurologic complication, with long-term current use of insulin ([place])   [place], Charlane Ferretti, MD   3 months ago Type 2 diabetes mellitus with other neurologic complication, with long-term current use of insulin ([place])   [place], [place], MD   7 months ago 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 ([place])   [place], Chancellor, MD   10 months ago Type 2 diabetes mellitus with other neurologic complication, with long-term current use of insulin ([place])   [place] [name], MD   11 months ago Hospital discharge follow-up   [place], FNP       Future Appointments             In 2 months [name], MD [place]               Requested Prescriptions  Pending Prescriptions Disp Refills   pregabalin (LYRICA) 75 MG capsule 180 capsule 1    Sig: Take 1 capsule (75 mg total) by mouth 2 (two) times daily.     Not Delegated - Neurology:  Anticonvulsants - Controlled - pregabalin Failed - 09/13/2022 11:08 AM      Failed - This refill cannot be delegated      Failed - Cr in normal range and within 360 days    Creat  Date Value Ref Range Status  12/12/2012 0.88 0.50 - 1.10 mg/dL Final   Creatinine, Ser  Date Value Ref Range Status  07/01/2022 2.09 (H) 0.44 - 1.00 mg/dL Final   Creatinine,U  Date Value Ref Range Status  06/13/2007 45.3 mg/dL Final    Comment:    See lab report for associated comment(s)  Creatinine, Urine  Date Value Ref Range Status  09/15/2021 47 mg/dL Final    Comment:    Performed at [facility], [address] [place], [place] [address]         Passed - Completed PHQ-2 or PHQ-9 in the last 360 days      Passed - Valid encounter within last 12 months    Recent Outpatient Visits           1 week ago Type 2 diabetes mellitus with other neurologic complication, with long-term current use of insulin ([place])   [place], Charlane Ferretti, MD   3 months ago Type 2 diabetes mellitus with other neurologic complication, with long-term current use of insulin ([place])   [place], Hewitt, MD   7 months ago 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 [facility])   [place], [place], MD   10 months ago Type 2  diabetes mellitus with other neurologic complication, with long-term current use of insulin [facility])   [place] [name], MD   11 months ago Hospital discharge follow-up   [place], [place]       Future Appointments             In 2 months [name], MD [place]

## 2022-09-14 ENCOUNTER — Emergency Department (HOSPITAL_COMMUNITY)
Admission: EM | Admit: 2022-09-14 | Discharge: 2022-09-14 | Disposition: A | Discharge status: Home / Self Care | Payer: 59 | Attending: Emergency Medicine | Admitting: Emergency Medicine

## 2022-09-14 ENCOUNTER — Encounter (HOSPITAL_COMMUNITY): Payer: Self-pay | Admitting: Emergency Medicine

## 2022-09-14 ENCOUNTER — Emergency Department (HOSPITAL_COMMUNITY): Payer: 59

## 2022-09-14 ENCOUNTER — Other Ambulatory Visit: Payer: Self-pay

## 2022-09-14 DIAGNOSIS — I10 Essential (primary) hypertension: Secondary | ICD-10-CM

## 2022-09-14 DIAGNOSIS — J069 Acute upper respiratory infection, unspecified: Secondary | ICD-10-CM | POA: Diagnosis not present

## 2022-09-14 DIAGNOSIS — N184 Chronic kidney disease, stage 4 (severe): Secondary | ICD-10-CM | POA: Diagnosis not present

## 2022-09-14 DIAGNOSIS — Z20822 Contact with and (suspected) exposure to covid-19: Secondary | ICD-10-CM | POA: Diagnosis not present

## 2022-09-14 DIAGNOSIS — Z79899 Other long term (current) drug therapy: Secondary | ICD-10-CM | POA: Insufficient documentation

## 2022-09-14 DIAGNOSIS — I129 Hypertensive chronic kidney disease with stage 1 through stage 4 chronic kidney disease, or unspecified chronic kidney disease: Secondary | ICD-10-CM | POA: Diagnosis not present

## 2022-09-14 DIAGNOSIS — D696 Thrombocytopenia, unspecified: Secondary | ICD-10-CM

## 2022-09-14 DIAGNOSIS — J40 Bronchitis, not specified as acute or chronic: Secondary | ICD-10-CM

## 2022-09-14 DIAGNOSIS — R0602 Shortness of breath: Secondary | ICD-10-CM | POA: Diagnosis present

## 2022-09-14 LAB — TROPONIN I (HIGH SENSITIVITY): Troponin I (High Sensitivity): 4 ng/L (ref <18)

## 2022-09-14 LAB — BASIC METABOLIC PANEL
Anion gap: 11 (ref 5–15)
BUN: 16 mg/dL (ref 8–23)
CO2: 22 mmol/L (ref 22–32)
Calcium: 9 mg/dL (ref 8.9–10.3)
Chloride: 107 mmol/L (ref 98–111)
Creatinine, Ser: 1.67 mg/dL — ABNORMAL HIGH (ref 0.44–1.00)
GFR, Estimated: 35 mL/min — ABNORMAL LOW (ref >60)
Glucose, Bld: 149 mg/dL — ABNORMAL HIGH (ref 70–99)
Potassium: 4.1 mmol/L (ref 3.5–5.1)
Sodium: 140 mmol/L (ref 135–145)

## 2022-09-14 LAB — CBC
HCT: 38.1 % (ref 36.0–46.0)
Hemoglobin: 11.4 g/dL — ABNORMAL LOW (ref 12.0–15.0)
MCH: 28.2 pg (ref 26.0–34.0)
MCHC: 29.9 g/dL — ABNORMAL LOW (ref 30.0–36.0)
MCV: 94.3 fL (ref 80.0–100.0)
Platelets: 136 10*3/uL — ABNORMAL LOW (ref 150–400)
RBC: 4.04 MIL/uL (ref 3.87–5.11)
RDW: 13.4 % (ref 11.5–15.5)
WBC: 3.7 10*3/uL — ABNORMAL LOW (ref 4.0–10.5)
nRBC: 0 % (ref 0.0–0.2)

## 2022-09-14 LAB — RESP PANEL BY RT-PCR (RSV, FLU A&B, COVID)  RVPGX2
Influenza A by PCR: NEGATIVE
Influenza B by PCR: NEGATIVE
Resp Syncytial Virus by PCR: NEGATIVE
SARS Coronavirus 2 by RT PCR: NEGATIVE

## 2022-09-14 MED ORDER — AZITHROMYCIN 250 MG PO TABS: 250.0000 mg | ORAL_TABLET | Freq: Every day | ORAL | 0 refills | Status: AC

## 2022-09-14 MED ORDER — ALBUTEROL SULFATE (2.5 MG/3ML) 0.083% IN NEBU
5.0000 mg | INHALATION_SOLUTION | Freq: Once | RESPIRATORY_TRACT | Status: AC
  Administered 2022-09-14: 5 mg via RESPIRATORY_TRACT
  Filled 2022-09-14: qty 6

## 2022-09-14 MED ORDER — ALBUTEROL SULFATE HFA 108 (90 BASE) MCG/ACT IN AERS: 2.0000 | INHALATION_SPRAY | RESPIRATORY_TRACT | 1 refills | Status: DC | PRN

## 2022-09-14 MED ORDER — AZITHROMYCIN 250 MG PO TABS
500.0000 mg | ORAL_TABLET | Freq: Once | ORAL | Status: AC
  Administered 2022-09-14: 500 mg via ORAL
  Filled 2022-09-14: qty 2

## 2022-09-14 NOTE — ED Triage Notes (Signed)
Pt with cough, nasal congestion, generalized body aches, sob, and chest pain since Sunday but has worsened since yesterday. Chest pain worse with cough.

## 2022-09-14 NOTE — Telephone Encounter (Signed)
Famotidine- 06/25/22 #30 3RF Oxybutynin- 05/31/22 #60 6RF Requested Prescriptions  Pending Prescriptions Disp Refills   famotidine (PEPCID) 40 MG tablet [Pharmacy Med Name: famotidine 40 mg tablet] 30 tablet 3    Sig: TAKE ONE TABLET BY MOUTH ONCE DAILY     Gastroenterology:  H2 Antagonists Passed - 09/13/2022  3:46 PM      Passed - Valid encounter within last 12 months    Recent Outpatient Visits           1 week ago Type 2 diabetes mellitus with other neurologic complication, with long-term current use of insulin ([place])   [place], Charlane Ferretti, MD   3 months ago Type 2 diabetes mellitus with other neurologic complication, with long-term current use of insulin ([place])   [place], [place], MD   7 months ago 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 ([place])   [place], [place], MD   10 months ago Type 2 diabetes mellitus with other neurologic complication, with long-term current use of insulin ([place])   [place] [name], MD   11 months ago Hospital discharge follow-up   [place], [place]       Future Appointments             In 2 months [name], MD [place]             oxybutynin ([place]) 5 MG tablet [Pharmacy Med Name: oxybutynin chloride 5 mg tablet] 60 tablet 6    Sig: TAKE ONE TABLET BY MOUTH twice daily     Urology:  Bladder Agents Passed - 09/13/2022  3:46 PM      Passed - Valid encounter within last 12 months    Recent Outpatient Visits           1 week ago Type 2 diabetes mellitus with other neurologic complication, with long-term current use of insulin ([place])   [place], [place], MD   3 months ago Type 2 diabetes  mellitus with other neurologic complication, with long-term current use of insulin ([place])   [place], Harvard, MD   7 months ago 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 [facility])   [place], [place]iquiu, MD   10 months ago Type 2 diabetes mellitus with other neurologic complication, with long-term current use of insulin [facility])   [place] [name], MD   11 months ago Hospital discharge follow-up   [place], [place]       Future Appointments             In 2 months [name], MD [place]

## 2022-09-14 NOTE — Discharge Instructions (Addendum)
It was our pleasure to provide your ER care today - we hope that you feel better.  Drink adequate fluids/stay well hydrated.  Take zithromax (antibiotic) as prescribed. Use albuterol inhaler as need.   Follow up closely with primary care doctor in one week if symptoms fail to improve/resolve.  Return to ER right away if worse, new symptoms, increased trouble breathing, recurrent/persistent chest pain, or other emergency concern.

## 2022-09-14 NOTE — ED Provider Notes (Signed)
[facility] Provider Note   CSN: [id number] Arrival date & time: 09/14/22  0745     History {Add pertinent medical, surgical, social history, OB history to HPI:1} Chief Complaint  Patient presents with   Chest Pain   Nasal Congestion   Cough    [name] [name] is a 62 y.o. female.  Patient c/o nasal congestion, rhinorrhea, cough in the past three days. Symptoms acute onset, moderate, persistent. Cough occasionally productive of phlegm. No hemoptysis. With coughing spell has chest wall soreness. No other episodic or exertional chest pain. No sob, nv or diaphoresis. No specific known ill contacts. No fever. Denies orthopnea/pnd. No leg pain or swelling. Family indicates coughing a lot, and sounds congested. Non smoker. No hx cad. No new or worsening leg pain or swelling.   The history is provided by the patient, medical records and a relative.  Chest Pain Associated symptoms: cough and shortness of breath   Associated symptoms: no abdominal pain, no fever, no headache and no vomiting   Shortness of Breath Associated symptoms: chest pain and cough   Associated symptoms: no abdominal pain, no fever, no headaches, no neck pain, no rash, no sore throat and no vomiting        Home Medications Prior to Admission medications   Medication Sig Start Date End Date Taking? Authorizing Provider  albuterol (VENTOLIN HFA) 108 (90 Base) MCG/ACT inhaler Inhale 1 puff into the lungs every 4 (four) hours as needed for wheezing or shortness of breath. 12/10/20   [name], MD  amLODipine (NORVASC) 2.5 MG tablet Take 1 tablet (2.5 mg total) by mouth daily. 05/31/22   [name], MD  atorvastatin (LIPITOR) 40 MG tablet Take 1 tablet (40 mg total) by mouth every morning. 05/31/22   [name], MD  carvedilol (COREG) 25 MG tablet TAKE ONE TABLET BY MOUTH twice daily 06/04/22   Minus Breeding, MD  empagliflozin (JARDIANCE) 10 MG TABS tablet  Take 1 tablet (10 mg total) by mouth daily. 05/31/22   [name], MD  famotidine (PEPCID) 40 MG tablet TAKE ONE TABLET BY MOUTH daily 06/25/22   [name], MD  ferrous sulfate 325 (65 FE) MG tablet Take 325 mg by mouth daily with breakfast.    [provider]  hydrALAZINE (APRESOLINE) 50 MG tablet Take 1.5 tablets (75 mg total) by mouth 3 (three) times daily. 06/16/22   [place], Maricela Bo, FNP  hydrocortisone-pramoxine (ANALPRAM HC) 2.5-1 % rectal cream Place 1 Application rectally 3 (three) times daily. 05/31/22   [name], MD  methimazole (TAPAZOLE) 5 MG tablet Take 1 tablet (5 mg total) by mouth daily. 05/31/22   Brita Romp, NP  mometasone-formoterol (DULERA) 100-5 MCG/ACT AERO Inhale 2 puffs into the lungs as needed for wheezing.    [provider]  nystatin ointment (MYCOSTATIN) Apply 1 application. topically 2 (two) times daily as needed (rash/skin irritation). 11/16/21   [name], MD  oxybutynin (DITROPAN) 5 MG tablet Take 1 tablet (5 mg total) by mouth 2 (two) times daily. 05/31/22   [name], MD  pregabalin (LYRICA) 75 MG capsule Take 1 capsule (75 mg total) by mouth 2 (two) times daily. 05/31/22   [name], MD  Semaglutide, 1 MG/DOSE, 4 MG/3ML SOPN Inject 1 mg as directed once a week. 05/31/22   [name], MD  spironolactone (ALDACTONE) 25 MG tablet TAKE 1/2 TABLET BY MOUTH daily 06/14/22   Rafael Bihari, FNP  torsemide [facility]) 20  MG tablet Take 20 mg alternating with 40 mg daily 06/16/22   Rafael Bihari, FNP      Allergies    Lisinopril    Review of Systems   Review of Systems  Constitutional:  Negative for fever.  HENT:  Positive for congestion and rhinorrhea. Negative for sore throat.   Eyes:  Negative for redness.  Respiratory:  Positive for cough and shortness of breath.   Cardiovascular:  Positive for chest pain. Negative for leg swelling.  Gastrointestinal:  Negative for abdominal pain, diarrhea  and vomiting.  Genitourinary:  Negative for flank pain.  Musculoskeletal:  Negative for neck pain and neck stiffness.  Skin:  Negative for rash.  Neurological:  Negative for headaches.  Hematological:  Does not bruise/bleed easily.  Psychiatric/Behavioral:  Negative for confusion.     Physical Exam Updated Vital Signs BP 122/83 (BP Location: Left Arm)   Pulse 82   Temp 97.6 F (36.4 C) (Oral)   Resp 18   LMP 04/01/2010   SpO2 94%  Physical Exam Vitals and nursing note reviewed.  Constitutional:      Appearance: Normal appearance. She is well-developed.  HENT:     Head: Atraumatic.     Nose: Congestion and rhinorrhea present.     Mouth/Throat:     Mouth: Mucous membranes are moist.     Pharynx: Oropharynx is clear. No oropharyngeal exudate or posterior oropharyngeal erythema.  Eyes:     General: No scleral icterus.    Conjunctiva/sclera: Conjunctivae normal.     Pupils: Pupils are equal, round, and reactive to light.  Neck:     Trachea: No tracheal deviation.     Comments: No stiffness or rigidity Cardiovascular:     Rate and Rhythm: Normal rate and regular rhythm.     Pulses: Normal pulses.     Heart sounds: Normal heart sounds. No murmur heard.    No friction rub. No gallop.  Pulmonary:     Effort: Pulmonary effort is normal. No respiratory distress.     Breath sounds: Wheezing present.     Comments: Coughing, upper resp congestion. Mild wheezing.  Abdominal:     General: Bowel sounds are normal. There is no distension.     Palpations: Abdomen is soft.     Tenderness: There is no abdominal tenderness.  Genitourinary:    Comments: No cva tenderness.  Musculoskeletal:        General: No swelling or tenderness.     Cervical back: Normal range of motion and neck supple. No rigidity. No muscular tenderness.  Lymphadenopathy:     Cervical: No cervical adenopathy.  Skin:    General: Skin is warm and dry.     Findings: No rash.  Neurological:     Mental Status: She  is alert.     Comments: Alert, speech normal.   Psychiatric:        Mood and Affect: Mood normal.     ED Results / Procedures / Treatments   Labs (all labs ordered are listed, but only abnormal results are displayed) Results for orders placed or performed during the hospital encounter of 09/14/22  Resp panel by RT-PCR (RSV, Flu A&B, Covid) Anterior Nasal Swab   Specimen: Anterior Nasal Swab  Result Value Ref Range   SARS Coronavirus 2 by RT PCR NEGATIVE NEGATIVE   Influenza A by PCR NEGATIVE NEGATIVE   Influenza B by PCR NEGATIVE NEGATIVE   Resp Syncytial Virus by PCR NEGATIVE NEGATIVE  Basic metabolic panel  Result Value Ref Range   Sodium 140 135 - 145 mmol/L   Potassium 4.1 3.5 - 5.1 mmol/L   Chloride 107 98 - 111 mmol/L   CO2 22 22 - 32 mmol/L   Glucose, Bld 149 (H) 70 - 99 mg/dL   BUN 16 8 - 23 mg/dL   Creatinine, Ser 1.67 (H) 0.44 - 1.00 mg/dL   Calcium 9.0 8.9 - 10.3 mg/dL   GFR, Estimated 35 (L) >60 mL/min   Anion gap 11 5 - 15  CBC  Result Value Ref Range   WBC 3.7 (L) 4.0 - 10.5 K/uL   RBC 4.04 3.87 - 5.11 MIL/uL   Hemoglobin 11.4 (L) 12.0 - 15.0 g/dL   HCT 38.1 36.0 - 46.0 %   MCV 94.3 80.0 - 100.0 fL   MCH 28.2 26.0 - 34.0 pg   MCHC 29.9 (L) 30.0 - 36.0 g/dL   RDW 13.4 11.5 - 15.5 %   Platelets 136 (L) 150 - 400 K/uL   nRBC 0.0 0.0 - 0.2 %  Troponin I (High Sensitivity)  Result Value Ref Range   Troponin I (High Sensitivity) 4 <18 ng/L   DG Chest 2 View  Result Date: 09/14/2022 CLINICAL DATA:  Shortness of breath and chest pain. Body aches. Cough. EXAM: CHEST - 2 VIEW COMPARISON:  09/22/2021 FINDINGS: Midline trachea. Normal heart size and mediastinal contours. No pleural effusion or pneumothorax. Clear lungs. IMPRESSION: No active cardiopulmonary disease. Electronically Signed   By: Abigail Miyamoto M.D.   On: 09/14/2022 08:21    EKG EKG Interpretation  Date/Time:  Tuesday September 14 2022 07:54:27 EST Ventricular Rate:  72 PR Interval:  170 QRS  Duration: 80 QT Interval:  398 QTC Calculation: 435 R Axis:   10 Text Interpretation: Sinus rhythm with occasional Premature ventricular complexes Non-specific intra-ventricular conduction delay Confirmed by Lajean Saver [phone]) on 09/14/2022 9:39:21 AM  Radiology DG Chest 2 View  Result Date: 09/14/2022 CLINICAL DATA:  Shortness of breath and chest pain. Body aches. Cough. EXAM: CHEST - 2 VIEW COMPARISON:  09/22/2021 FINDINGS: Midline trachea. Normal heart size and mediastinal contours. No pleural effusion or pneumothorax. Clear lungs. IMPRESSION: No active cardiopulmonary disease. Electronically Signed   By: Abigail Miyamoto M.D.   On: 09/14/2022 08:21    Procedures Procedures  {Document cardiac monitor, telemetry assessment procedure when appropriate:1}  Medications Ordered in ED Medications  albuterol (PROVENTIL) (2.5 MG/3ML) 0.083% nebulizer solution 5 mg (has no administration in time range)  azithromycin (ZITHROMAX) tablet 500 mg (has no administration in time range)    ED Course/ Medical Decision Making/ A&P   {   Click here for ABCD2, HEART and other calculatorsREFRESH Note before signing :1}                          Medical Decision Making Problems Addressed: Bronchitis: acute illness or injury with systemic symptoms CKD (chronic kidney disease) stage 4, GFR 15-29 ml/min ([place]): chronic illness or injury that poses a threat to life or bodily functions Thrombocytopenia ([place]): chronic illness or injury URI with cough and congestion: acute illness or injury with systemic symptoms that poses a threat to life or bodily functions  Amount and/or Complexity of Data Reviewed Independent Historian:     Details: Family, hx External Data Reviewed: labs and notes. Labs: ordered. Decision-making details documented in ED Course. Radiology: ordered and independent interpretation performed. Decision-making details documented in ED Course.  Risk Prescription drug management. Decision  regarding hospitalization.   Iv ns. Continuous pulse ox and cardiac monitoring. Labs ordered/sent. Imaging ordered.   Differential diagnosis includes covid, flu, bronchitis, pna, etc. Dispo decision including potential need for admission considered - will get labs and imaging and reassess.   Reviewed nursing notes and prior charts for additional history. External reports reviewed. Additional history from: family.  Cardiac monitor: sinus rhythm, rate 80.  Labs reviewed/interpreted by me - covid and flu neg. Pt sounds congested ?bronchitis/early pna. Zithromax po. Mild wheezing, albuterol neb.   Xrays reviewed/interpreted by me - no def pna.   Recheck wheezing improved. Pt breathing comfortably, o2 sats 97%.    Pt appears stable for d/c.  Rec pcp f/u.  Return precautions provided.      {Document critical care time when appropriate:1} {Document review of labs and clinical decision tools ie heart score, Chads2Vasc2 etc:1}  {Document your independent review of radiology images, and any outside records:1} {Document your discussion with family members, caretakers, and with consultants:1} {Document social determinants of health affecting pt's care:1} {Document your decision making why or why not admission, treatments were needed:1} Final Clinical Impression(s) / ED Diagnoses Final diagnoses:  URI with cough and congestion  Bronchitis  CKD (chronic kidney disease) stage 4, GFR 15-29 ml/min (HCC)  Thrombocytopenia ([place])    Rx / DC Orders ED Discharge Orders     None

## 2022-09-15 ENCOUNTER — Ambulatory Visit (HOSPITAL_COMMUNITY): Admission: RE | Admit: 2022-09-15 | Payer: Medicaid Other | Source: Ambulatory Visit

## 2022-09-15 ENCOUNTER — Ambulatory Visit (HOSPITAL_COMMUNITY): Payer: 59

## 2022-09-15 ENCOUNTER — Encounter (HOSPITAL_COMMUNITY): Payer: Self-pay | Admitting: Cardiology

## 2022-09-16 ENCOUNTER — Telehealth: Payer: Self-pay | Admitting: Family Medicine

## 2022-09-16 ENCOUNTER — Ambulatory Visit: Payer: Self-pay

## 2022-09-16 MED ORDER — PREGABALIN 75 MG PO CAPS: 75.0000 mg | ORAL_CAPSULE | Freq: Two times a day (BID) | ORAL | 1 refills | Status: DC

## 2022-09-16 NOTE — Telephone Encounter (Signed)
The directions for methimazole (TAPAZOLE) 5 MG tablet need confirmation / the pharmacy has two sets of directions / ( Take 1 tab 3 x's a day and the other says take 1 tab  daily ) / please advise

## 2022-09-16 NOTE — Telephone Encounter (Signed)
Message from [place] sent at 09/16/2022  2:07 PM EST  Summary: directions for med   Pharmacy called in needing directions for med, The directions for methimazole (TAPAZOLE) 5 MG tablet need confirmation / the pharmacy has two sets of directions / ( Take 1 tab 3 x's a day and the other says take 1 tab  daily ) /        Per message in chart from today "Medication Management" called pharmacy x 3 and got busy signal. Pharmacy needs to speak to Rayetta Pigg NP who wrote the prescription.

## 2022-09-16 NOTE — Telephone Encounter (Signed)
Pt is calling back stating that she does not remember who prescribed the medication for her and is wanting to know what should she do. Please advise.

## 2022-09-16 NOTE — Telephone Encounter (Signed)
Called patient and informed to contact endocrinology office for medication.

## 2022-09-16 NOTE — Telephone Encounter (Signed)
Message from [place] sent at 09/16/2022  2:07 PM EST  Summary: directions for med   Pharmacy called in needing directions for med, The directions for methimazole (TAPAZOLE) 5 MG tablet need confirmation / the pharmacy has two sets of directions / ( Take 1 tab 3 x's a day and the other says take 1 tab  daily ) /         Chief Complaint: Tapazole med directions  Disposition: '[]'$ ED /'[]'$ Urgent Care (no appt availability in office) / '[]'$ Appointment(In office/virtual)/ '[]'$  [facility]/ '[]'$ Home Care/ '[]'$ Refused Recommended Disposition /'[]'$ [facility]/ '[x]'$  Follow-up with PCP Additional Notes: per Office notes this med is prescribed by Rayetta Pigg NP. Called pharmacy and advised Christy Sartorius to call that office.  Will send note to office regarding conversation.  Reason for Disposition  [1] Pharmacy calling with prescription question AND [2] triager unable to answer question  Answer Assessment - Initial Assessment Questions 1. NAME of MEDICINE: "What medicine(s) are you calling about?"     Tapazole 2. QUESTION: "What is your question?" (e.g., double dose of medicine, side effect)     Prescription question 3. PRESCRIBER: "Who prescribed the medicine?" Reason: if prescribed by specialist, call should be referred to that group.    Rayetta Pigg NP  Protocols used: Medication Question Call-A-AH

## 2022-09-16 NOTE — Telephone Encounter (Addendum)
Call placed to patient unable to reach  unable to leave message. If patient returns call please inform them that they need to speak with  provider that write prescription.  Brita Romp, NP

## 2022-09-27 ENCOUNTER — Telehealth: Payer: Self-pay | Admitting: *Deleted

## 2022-09-27 ENCOUNTER — Telehealth: Payer: Self-pay

## 2022-09-27 ENCOUNTER — Ambulatory Visit: Payer: Self-pay | Admitting: Surgery

## 2022-09-27 DIAGNOSIS — D242 Benign neoplasm of left breast: Secondary | ICD-10-CM

## 2022-09-27 NOTE — Telephone Encounter (Signed)
  Patient Consent for Virtual Visit         [name] [name] has provided verbal consent on 09/27/2022 for a virtual visit (video or telephone).   CONSENT FOR VIRTUAL VISIT FOR:  [name] [name]  By participating in this virtual visit I agree to the following:  I hereby voluntarily request, consent and authorize [facility] and its employed or contracted physicians, physician assistants, nurse practitioners or other licensed health care professionals (the Practitioner), to provide me with telemedicine health care services (the "Services") as deemed necessary by the treating Practitioner. I acknowledge and consent to receive the Services by the Practitioner via telemedicine. I understand that the telemedicine visit will involve communicating with the Practitioner through live audiovisual communication technology and the disclosure of certain medical information by electronic transmission. I acknowledge that I have been given the opportunity to request an in-person assessment or other available alternative prior to the telemedicine visit and am voluntarily participating in the telemedicine visit.  I understand that I have the right to withhold or withdraw my consent to the use of telemedicine in the course of my care at any time, without affecting my right to future care or treatment, and that the Practitioner or I may terminate the telemedicine visit at any time. I understand that I have the right to inspect all information obtained and/or recorded in the course of the telemedicine visit and may receive copies of available information for a reasonable fee.  I understand that some of the potential risks of receiving the Services via telemedicine include:  Delay or interruption in medical evaluation due to technological equipment failure or disruption; Information transmitted may not be sufficient (e.g. poor resolution of images) to allow for appropriate medical decision making by the  Practitioner; and/or  In rare instances, security protocols could fail, causing a breach of personal health information.  Furthermore, I acknowledge that it is my responsibility to provide information about my medical history, conditions and care that is complete and accurate to the best of my ability. I acknowledge that Practitioner's advice, recommendations, and/or decision may be based on factors not within their control, such as incomplete or inaccurate data provided by me or distortions of diagnostic images or specimens that may result from electronic transmissions. I understand that the practice of medicine is not an exact science and that Practitioner makes no warranties or guarantees regarding treatment outcomes. I acknowledge that a copy of this consent can be made available to me via my patient portal ([facility]), or I can request a printed copy by calling the office of [facility].    I understand that my insurance will be billed for this visit.   I have read or had this consent read to me. I understand the contents of this consent, which adequately explains the benefits and risks of the Services being provided via telemedicine.  I have been provided ample opportunity to ask questions regarding this consent and the Services and have had my questions answered to my satisfaction. I give my informed consent for the services to be provided through the use of telemedicine in my medical care

## 2022-09-27 NOTE — Telephone Encounter (Signed)
   Pre-operative Risk Assessment    Patient Name: [name] [name]  DOB: [birth year] MRN: [id number]      Request for Surgical Clearance    Procedure:   LUMPECTOMY  Date of Surgery:  Clearance TBD                                 Surgeon:  Erroll Luna, MD Surgeon's Group or Practice Name:  [facility] Phone number:  [phone] Fax number:  [phone]   Type of Clearance Requested:   - Medical    Type of Anesthesia:  General    Additional requests/questions:    Astrid Divine   09/27/2022, 1:57 PM

## 2022-09-27 NOTE — Telephone Encounter (Signed)
   Name: [name] [name]  DOB: [birth year]  MRN: [id number]  Primary Cardiologist: Minus Breeding, MD   Preoperative team, please contact this patient and set up a phone call appointment for further preoperative risk assessment. Please obtain consent and complete medication review. Thank you for your help.  I confirm that guidance regarding antiplatelet and oral anticoagulation therapy has been completed and, if necessary, noted below.  None requested   Mable Fill, Marissa Nestle, NP 09/27/2022, 2:13 PM [place]

## 2022-09-27 NOTE — Telephone Encounter (Signed)
Patient agreeable with telehealth appointment.   Patient states she will contact the office to review medications and consent given.

## 2022-09-28 NOTE — Telephone Encounter (Signed)
S/w the pt and we have reviewed medications today.

## 2022-09-30 DIAGNOSIS — E113513 Type 2 diabetes mellitus with proliferative diabetic retinopathy with macular edema, bilateral: Secondary | ICD-10-CM | POA: Diagnosis not present

## 2022-09-30 DIAGNOSIS — Z961 Presence of intraocular lens: Secondary | ICD-10-CM | POA: Diagnosis not present

## 2022-09-30 DIAGNOSIS — Z7984 Long term (current) use of oral hypoglycemic drugs: Secondary | ICD-10-CM | POA: Diagnosis not present

## 2022-09-30 DIAGNOSIS — E113511 Type 2 diabetes mellitus with proliferative diabetic retinopathy with macular edema, right eye: Secondary | ICD-10-CM | POA: Diagnosis not present

## 2022-09-30 DIAGNOSIS — H4313 Vitreous hemorrhage, bilateral: Secondary | ICD-10-CM | POA: Diagnosis not present

## 2022-09-30 DIAGNOSIS — Z794 Long term (current) use of insulin: Secondary | ICD-10-CM | POA: Diagnosis not present

## 2022-09-30 LAB — HM DIABETES EYE EXAM

## 2022-10-07 ENCOUNTER — Ambulatory Visit: Payer: 59 | Attending: Internal Medicine | Admitting: Physician Assistant

## 2022-10-07 ENCOUNTER — Encounter: Payer: Self-pay | Admitting: Obstetrics and Gynecology

## 2022-10-07 ENCOUNTER — Other Ambulatory Visit: Payer: 59 | Admitting: Obstetrics and Gynecology

## 2022-10-07 NOTE — Progress Notes (Signed)
Patient will need scheduled for an in-person appointment after her PET for clearance.    Elgie Collard, PA-C

## 2022-10-07 NOTE — Telephone Encounter (Addendum)
Patient will need scheduled for an in-person appointment after her PET for clearance.      Elgie Collard, PA-C         I will forward to [facility] clinic to schedule the pt an in office appt per pre op APP Nicholes Rough, PAC. I will update the requesting office as well.

## 2022-10-07 NOTE — Patient Instructions (Signed)
Visit Information  [name] [name] was given information about Medicaid Managed Care team care coordination services as a part of their [place] Medicaid benefit. [name] [name] verbally consented to engagement with the [facility] Managed Care team.   If you are experiencing a medical emergency, please call 911 or report to your local emergency department or urgent care.   If you have a non-emergency medical problem during routine business hours, please contact your provider's office and ask to speak with a nurse.   For questions related to your [facility], please call: [phone] or visit the homepage here: [email or web address]  If you would like to schedule transportation through your [facility], please call the following number at least 2 days in advance of your appointment: [phone]   Rides for urgent appointments can also be made after hours by calling Member Services.  Call the [facility] at [phone], at any time, 24 hours a day, 7 days a week. If you are in danger or need immediate medical attention call 911.  If you would like help to quit smoking, call 1-800-QUIT-NOW 602-531-3522) OR Espaol: 1-855-Djelo-Ya HD:1601594) o para ms informacin haga clic aqu or Text READY to 200-400 to register via text  [name] [name] - following are the goals we discussed in your visit today:   Goals Addressed             This Visit's Progress    Protect My Health       Timeframe:  Long-Range Goal Priority:  Medium Start Date:          10/29/20                   Expected End Date: ongoing             Follow Up Date: 11/09/22   - schedule appointment for flu shot - schedule appointment for vaccines needed due to my age or health - schedule recommended health tests (blood work, mammogram, colonoscopy, pap test) - schedule and keep  appointment for annual check-up   10/07/22:  PET scan scheduled for 10/19/22, breast lump.  Saw Ophthalmology 3/14.    Why is this important?   Screening tests can find diseases early when they are easier to treat.  Your doctor or nurse will talk with you about which tests are important for you.  Getting shots for common diseases like the flu and shingles will help prevent them.     Patient verbalizes understanding of instructions and care plan provided today and agrees to view in [place]. Active MyChart status and patient understanding of how to access instructions and care plan via MyChart confirmed with patient.     The Managed Medicaid care management team will reach out to the patient again over the next 30 business  days.  The  Patient  has been provided with contact information for the Managed Medicaid care management team and has been advised to call with any health related questions or concerns.   [name] RN, BSN [facility] Management Coordinator - Managed Medicaid High Risk [phone]   Following is a copy of your plan of care:  Care Plan : General Plan of Care (Adult)  Updates made by Gayla Medicus, RN since 10/07/2022 12:00 AM     Problem: Quality of Life (General Plan of Care)   Priority: High  Onset Date: 10/29/2020     [facility]  Goal: Quality of Life Maintained   Start Date: 10/29/2020  Expected End Date: 01/07/2023  Recent Progress: Not on track  Priority: High  Note:    CARE PLAN ENTRY Medicaid Managed Care (see longitudinal plan of care for additional care plan information)  Current Barriers:  Chronic case management needs related to chronic health conditions, HTN, DM, COPD, OSA, CKD, CHF, HLD, hypothyroid 10/07/22:  patient does not check BP or BG.  Patient has appt for PET scan 10/19/22 for lumpectomy.  No complaints today.  Seen in ED 2/27 for URI/bronchitis.    Nurse Case Manager Clinical Goal(s):  Over the next 30 days,  patient will verbalize understanding of plan for decreased swelling.  Over the next 30 days, patient will work with provider to address needs  Over the next 30 days, patient will continue to work with Pharmacist. Over the next 30 days, patient will attend all scheduled appointments.  Interventions:  Inter-disciplinary care team collaboration (see longitudinal plan of care) Reviewed medications with patient. Discussed plans with patient for ongoing care management follow up and provided patient with direct contact information for care management team. Collaboration with Pharmacist for review of medications. Pharmacy referral for medication review. Collaborated with BSW for resources. BSW  Referral for resources-completed Collaborated with PCP for GI referral at patient request Care Guide referral for Medicare information Collaborated with Care Guide.  Heart Failure Interventions:  (Status:  New goal.) Long Term Goal Basic overview and discussion of pathophysiology of Heart Failure reviewed Provided education on low sodium diet Assessed need for readable accurate scales in home Provided education about placing scale on hard, flat surface Advised patient to weigh each morning after emptying bladder Discussed importance of daily weight and advised patient to weigh and record daily Discussed the importance of keeping all appointments with provider Assessed social determinant of health barriers    Chronic Kidney Disease Interventions:  (Status:  New goal.) Long Term Goal Evaluation of current treatment plan related to chronic kidney disease self management and patient's adherence to plan as established by provider      Reviewed prescribed diet  Reviewed medications with patient and discussed importance of compliance    Counseled on the importance of exercise goals with target of 150 minutes per week     Advised patient, providing education and rationale, to monitor blood pressure daily and  record, calling PCP for findings outside established parameters    Discussed complications of poorly controlled blood pressure such as heart disease, stroke, circulatory complications, vision complications, kidney impairment, sexual dysfunction    Reviewed scheduled/upcoming provider appointments  Discussed plans with patient for ongoing care management follow up and provided patient with direct contact information for care management team    Assessed social determinant of health barriers    Last practice recorded BP readings:  BP Readings from Last 3 Encounters:  09/14/22 (!) 156/82  09/06/22 133/83  08/10/22 122/78   Most recent eGFR/CrCl:  Lab Results  Component Value Date   EGFR 32 (L) 12/21/2021    No components found for: "CRCL"  COPD Interventions:  (Status:  New goal.) Long Term Goal Advised patient to track and manage COPD triggers Advised patient to self assesses COPD action plan zone and make appointment with provider if in the yellow zone for 48 hours without improvement Advised patient to engage in light exercise as tolerated 3-5 days a week to aid in the the management of COPD Provided education about and advised patient to utilize infection prevention  strategies to reduce risk of respiratory infection Discussed the importance of adequate rest and management of fatigue with COPD Assessed social determinant of health barriers  Diabetes Interventions:  (Status:  New goal.) Long Term Goal Assessed patient's understanding of A1c goal: <7% Reviewed medications with patient and discussed importance of medication adherence Counseled on importance of regular laboratory monitoring as prescribed Discussed plans with patient for ongoing care management follow up and provided patient with direct contact information for care management team Reviewed scheduled/upcoming provider appointments  Advised patient, providing education and rationale, to check cbg as directed  and record,  calling provider for findings outside established parameters Review of patient status, including review of consultants reports, relevant laboratory and other test results, and medications completed Assessed social determinant of health barriers Lab Results  Component Value Date   HGBA1C 6.5 09/06/2022  Hyperlipidemia Interventions:  (Status:  New goal.) Long Term Goal Medication review performed; medication list updated in electronic medical record.  Counseled on importance of regular laboratory monitoring as prescribed Reviewed importance of limiting foods high in cholesterol Assessed social determinant of health barriers   Hypertension Interventions:  (Status:  New goal.) Long Term Goal Last practice recorded BP readings:  BP Readings from Last 3 Encounters:  09/14/22 (!) 156/82  09/06/22 133/83  08/10/22 122/78   Most recent eGFR/CrCl:  Lab Results  Component Value Date   EGFR 32 (L) 12/21/2021    No components found for: "CRCL"  Evaluation of current treatment plan related to hypertension self management and patient's adherence to plan as established by provider Reviewed medications with patient and discussed importance of compliance Discussed plans with patient for ongoing care management follow up and provided patient with direct contact information for care management team Reviewed scheduled/upcoming provider appointments including:  Discussed complications of poorly controlled blood pressure such as heart disease, stroke, circulatory complications, vision complications, kidney impairment, sexual dysfunction Assessed social determinant of health barriers   Plan:  Patient will follow up with provider and fill all prescriptions RNCM will follow up with patient within 45  business days.

## 2022-10-07 NOTE — Patient Outreach (Signed)
Medicaid Managed Care   Nurse Care Manager Note  10/07/2022 Name:  [name] [name] MRN:  [id number] DOB:  1961-04-04  [name] [name] is an 62 y.o. year old female who is a primary patient of [name] Rakes, MD.  The [facility] Managed Care Coordination team was consulted for assistance with:    Chronic healthcare management needs, DM, HTN, COPD, OSA, CKD, CHF, HLD, Graves' disease, neuropathy  [name] [name] was given information about Medicaid Managed Care Coordination team services today. [name] [name] Patient agreed to services and verbal consent obtained.  Engaged with patient by telephone for follow up visit in response to provider referral for case management and/or care coordination services.   Assessments/Interventions:  Review of past medical history, allergies, medications, health status, including review of consultants reports, laboratory and other test data, was performed as part of comprehensive evaluation and provision of chronic care management services.  SDOH (Social Determinants of Health) assessments and interventions performed: SDOH Interventions    Flowsheet Row Patient Outreach Telephone from 10/07/2022 in [place] Patient Outreach Telephone from 07/22/2022 in [place] Patient Outreach Telephone from 04/13/2022 in [place] Patient Outreach Telephone from 02/04/2022 in [place] Patient Outreach Telephone from 12/29/2021 in [place] Patient Outreach Telephone from 11/26/2021 in [place] Coordination  SDOH Interventions        Food Insecurity Interventions -- -- -- -- -- Intervention Not Indicated  Transportation Interventions -- -- -- -- -- Intervention Not Indicated  Utilities Interventions -- -- Intervention Not Indicated -- -- --  Financial Strain  Interventions -- Intervention Not Indicated -- -- Intervention Not Indicated --  Physical Activity Interventions Other (Comments)  [not able to engage at exercise this level.] -- -- Other (Comments)  [patient not physically able to engage in a moderate to strenuous exercise] -- --  Stress Interventions Intervention Not Indicated -- Intervention Not Indicated -- -- --     Care Plan  Allergies  Allergen Reactions   Lisinopril Swelling    Medications Reviewed Today     Reviewed by [name] Medicus, RN (Registered Nurse) on 10/07/22 at 1033  Med List Status: <None>   Medication Order Taking? Sig Documenting Provider Last Dose Status Informant  albuterol (VENTOLIN HFA) 108 (90 Base) MCG/ACT inhaler [id number] No Inhale 2 puffs into the lungs every 4 (four) hours as needed for wheezing or shortness of breath. Lajean Saver, MD Taking Active   amLODipine (NORVASC) 2.5 MG tablet [id number] No Take 1 tablet (2.5 mg total) by mouth daily. [name] Rakes, MD Taking Active   atorvastatin (LIPITOR) 40 MG tablet [id number] No Take 1 tablet (40 mg total) by mouth every morning. [name] Rakes, MD Taking Active   carvedilol (COREG) 25 MG tablet [id number] No TAKE ONE TABLET BY MOUTH twice daily Minus Breeding, MD Taking Active   empagliflozin (JARDIANCE) 10 MG TABS tablet [id number] No Take 1 tablet (10 mg total) by mouth daily. [name] Rakes, MD Taking Active   famotidine (PEPCID) 40 MG tablet [id number] No TAKE ONE TABLET BY MOUTH daily [name] Rakes, MD Taking Active   ferrous sulfate 325 (65 FE) MG tablet [id number] No Take 325 mg by mouth daily with breakfast. [provider] Taking Active   hydrALAZINE (APRESOLINE) 50 MG tablet [id number] No Take 1.5 tablets (75 mg total) by mouth 3 (three) times daily. [place], Maricela Bo, FNP Taking Active  hydrocortisone-pramoxine (ANALPRAM HC) 2.5-1 % rectal cream [id number] No Place 1 Application rectally 3 (three) times daily. [name] Rakes, MD Taking  Active   methimazole (TAPAZOLE) 5 MG tablet [id number] No Take 1 tablet (5 mg total) by mouth daily.  Patient not taking: Reported on 09/28/2022   Brita Romp, NP Not Taking Active            Med Note [name], [name]   Tue Jun 08, 2022  3:21 PM) Discontinued per endocrinologist  mometasone-formoterol [facility]) 100-5 MCG/ACT AERO [id number] No Inhale 2 puffs into the lungs as needed for wheezing. [provider] Taking Active   nystatin ointment (MYCOSTATIN) [id number] No Apply 1 application. topically 2 (two) times daily as needed (rash/skin irritation). [name] Rakes, MD Taking Active            Med Note [name], [name]   Tue Apr 20, 2022 11:23 AM)    oxybutynin (DITROPAN) 5 MG tablet [id number] No Take 1 tablet (5 mg total) by mouth 2 (two) times daily. [name] Rakes, MD Taking Active   pregabalin (LYRICA) 75 MG capsule [id number] No Take 1 capsule (75 mg total) by mouth 2 (two) times daily. [name] Rakes, MD Taking Active   Semaglutide, 1 MG/DOSE, 4 MG/3ML SOPN [id number] No Inject 1 mg as directed once a week. 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, with long-term current use of insulin, unspecified retinopathy severity ([place]) 09/06/2022   Graves disease 04/23/2022   Heart failure ([place]) 09/22/2021   CKD (chronic kidney disease) stage 4, GFR 15-29 ml/min ([place]) 07/03/2021   COVID-19 05/05/2021   CHF exacerbation ([place]) 04/02/2021   Chest pain 04/02/2021   Acute-on-chronic kidney injury ([place]) 04/02/2021   COPD exacerbation ([place]) 12/05/2020   CKD (chronic kidney disease), stage III ([place])  12/04/2020   Anemia of chronic disease 12/04/2020   Obesity, Class III, BMI 40-49.9 (morbid obesity) ([place]) 07/27/2020   Acute respiratory disease due to COVID-19 virus 07/25/2020   Morbid obesity ([place]) 11/23/2019   Chronic kidney disease, stage 3b ([place]) 11/06/2019   COPD without exacerbation ([place])    Acute kidney failure ([place]) 08/18/2019   Left ventricular hypertrophy 07/21/2019   Educated about COVID-19 virus infection [id number]   Acute diastolic HF (heart failure) ([place])    Hypoxia    OSA (obstructive sleep apnea) 05/09/2019   Hyperglycemia 04/09/2019   Snoring 03/13/2019   Chronic anemia 08/17/2018   Chronic cystitis 08/03/2018   Diabetic neuropathy ([place]) 06/16/2017   Non compliance w medication regimen 04/12/2017   Family history of colon cancer in mother 11/17/2016   Dyspareunia in female 11/11/2016   Screen for colon cancer 11/11/2016   History of ovarian cyst 11/11/2016   Chronic hepatitis C without hepatic coma ([place]) 11/09/2016   Hyperlipidemia 10/07/2016   Type 2 diabetes mellitus with hyperlipidemia ([place]) 01/29/2014   Status post intraocular lens implant 11/08/2013   PCO (posterior capsular opacification) 09/28/2013   Pseudophakia 09/12/2013   GERD (gastroesophageal reflux disease) 09/06/2013   Hypertension 09/06/2013   ANEMIA, IRON DEFICIENCY 10/03/2009   LIPOMA 06/18/2009  TINEA PEDIS 05/07/2009   SUBSTANCE ABUSE, MULTIPLE 02/22/2007   HCVD (hypertensive cardiovascular disease) 02/22/2007   Conditions to be addressed/monitored per PCP order:  Chronic healthcare management needs, DM, HTN, COPD, OSA, CKD, CHF, HLD, Graves' disease, neuropathy  Care Plan : General Plan of Care (Adult)  Updates made by [name] Medicus, RN since 10/07/2022 12:00 AM     Problem: Quality of Life (General Plan of Care)   Priority: High  Onset Date: 10/29/2020     Long-Range Goal: Quality of Life Maintained   Start Date: 10/29/2020  Expected End Date: 01/07/2023  Recent Progress: Not  on track  Priority: High  Note:    CARE PLAN ENTRY Medicaid Managed Care (see longitudinal plan of care for additional care plan information)  Current Barriers:  Chronic case management needs related to chronic health conditions, HTN, DM, COPD, OSA, CKD, CHF, HLD, hypothyroid 10/07/22:  patient does not check BP or BG.  Patient has appt for PET scan 10/19/22 for lumpectomy.  No complaints today.  Seen in ED 2/27 for URI/bronchitis.    Nurse Case Manager Clinical Goal(s):  Over the next 30 days, patient will verbalize understanding of plan for decreased swelling.  Over the next 30 days, patient will work with provider to address needs  Over the next 30 days, patient will continue to work with Pharmacist. Over the next 30 days, patient will attend all scheduled appointments.  Interventions:  Inter-disciplinary care team collaboration (see longitudinal plan of care) Reviewed medications with patient. Discussed plans with patient for ongoing care management follow up and provided patient with direct contact information for care management team. Collaboration with Pharmacist for review of medications. Pharmacy referral for medication review. Collaborated with BSW for resources. BSW  Referral for resources-completed Collaborated with PCP for GI referral at patient request Care Guide referral for Medicare information Collaborated with Care Guide.  Heart Failure Interventions:  (Status:  New goal.) Long Term Goal Basic overview and discussion of pathophysiology of Heart Failure reviewed Provided education on low sodium diet Assessed need for readable accurate scales in home Provided education about placing scale on hard, flat surface Advised patient to weigh each morning after emptying bladder Discussed importance of daily weight and advised patient to weigh and record daily Discussed the importance of keeping all appointments with provider Assessed social determinant of health barriers     Chronic Kidney Disease Interventions:  (Status:  New goal.) Long Term Goal Evaluation of current treatment plan related to chronic kidney disease self management and patient's adherence to plan as established by provider      Reviewed prescribed diet  Reviewed medications with patient and discussed importance of compliance    Counseled on the importance of exercise goals with target of 150 minutes per week     Advised patient, providing education and rationale, to monitor blood pressure daily and record, calling PCP for findings outside established parameters    Discussed complications of poorly controlled blood pressure such as heart disease, stroke, circulatory complications, vision complications, kidney impairment, sexual dysfunction    Reviewed scheduled/upcoming provider appointments  Discussed plans with patient for ongoing care management follow up and provided patient with direct contact information for care management team    Assessed social determinant of health barriers    Last practice recorded BP readings:  BP Readings from Last 3 Encounters:  09/14/22 (!) 156/82  09/06/22 133/83  08/10/22 122/78   Most recent eGFR/CrCl:  Lab Results  Component Value Date  EGFR 32 (L) 12/21/2021    No components found for: "CRCL"  COPD Interventions:  (Status:  New goal.) Long Term Goal Advised patient to track and manage COPD triggers Advised patient to self assesses COPD action plan zone and make appointment with provider if in the yellow zone for 48 hours without improvement Advised patient to engage in light exercise as tolerated 3-5 days a week to aid in the the management of COPD Provided education about and advised patient to utilize infection prevention strategies to reduce risk of respiratory infection Discussed the importance of adequate rest and management of fatigue with COPD Assessed social determinant of health barriers  Diabetes Interventions:  (Status:  New goal.) Long  Term Goal Assessed patient's understanding of A1c goal: <7% Reviewed medications with patient and discussed importance of medication adherence Counseled on importance of regular laboratory monitoring as prescribed Discussed plans with patient for ongoing care management follow up and provided patient with direct contact information for care management team Reviewed scheduled/upcoming provider appointments  Advised patient, providing education and rationale, to check cbg as directed  and record, calling provider for findings outside established parameters Review of patient status, including review of consultants reports, relevant laboratory and other test results, and medications completed Assessed social determinant of health barriers Lab Results  Component Value Date   HGBA1C 6.5 09/06/2022  Hyperlipidemia Interventions:  (Status:  New goal.) Long Term Goal Medication review performed; medication list updated in electronic medical record.  Counseled on importance of regular laboratory monitoring as prescribed Reviewed importance of limiting foods high in cholesterol Assessed social determinant of health barriers   Hypertension Interventions:  (Status:  New goal.) Long Term Goal Last practice recorded BP readings:  BP Readings from Last 3 Encounters:  09/14/22 (!) 156/82  09/06/22 133/83  08/10/22 122/78   Most recent eGFR/CrCl:  Lab Results  Component Value Date   EGFR 32 (L) 12/21/2021    No components found for: "CRCL"  Evaluation of current treatment plan related to hypertension self management and patient's adherence to plan as established by provider Reviewed medications with patient and discussed importance of compliance Discussed plans with patient for ongoing care management follow up and provided patient with direct contact information for care management team Reviewed scheduled/upcoming provider appointments including:  Discussed complications of poorly controlled blood  pressure such as heart disease, stroke, circulatory complications, vision complications, kidney impairment, sexual dysfunction Assessed social determinant of health barriers   Plan:  Patient will follow up with provider and fill all prescriptions RNCM will follow up with patient within 45  business days.    Follow Up:  Patient agrees to Care Plan and Follow-up.  Plan: The Managed Medicaid care management team will reach out to the patient again over the next 30 business  days. and The  Patient has been provided with contact information for the Managed Medicaid care management team and has been advised to call with any health related questions or concerns.  Date/time of next scheduled RN care management/care coordination outreach: 11/09/22 at 1230

## 2022-10-15 ENCOUNTER — Telehealth (HOSPITAL_COMMUNITY): Payer: Self-pay | Admitting: Emergency Medicine

## 2022-10-15 NOTE — Telephone Encounter (Signed)
Reaching out to patient to offer assistance regarding upcoming cardiac imaging study; pt verbalizes understanding of appt date/time, parking situation and where to check in, pre-test NPO status and medications ordered, and verified current allergies; name and call back number provided for further questions should they arise [name] Bond RN Navigator Cardiac Imaging [facility] [phone] office [phone] cell  Arrival [address] [facility] radiology No food  No caffeine Holding medication til after scan

## 2022-10-19 ENCOUNTER — Ambulatory Visit (HOSPITAL_COMMUNITY)
Admission: RE | Admit: 2022-10-19 | Discharge: 2022-10-19 | Disposition: A | Discharge status: Home / Self Care | Payer: Medicaid Other | Source: Ambulatory Visit | Attending: Cardiology | Admitting: Cardiology

## 2022-10-19 DIAGNOSIS — I5022 Chronic systolic (congestive) heart failure: Secondary | ICD-10-CM | POA: Diagnosis present

## 2022-10-19 LAB — NM PET CT CARDIAC PERFUSION MULTI W/ABSOLUTE BLOODFLOW
LV dias vol: 89 mL (ref 46–106)
LV sys vol: 30 mL
MBFR: 1.97
Nuc Rest EF: 66 %
Nuc Stress EF: 76 %
Rest MBF: 1.01 ml/g/min
Rest Nuclear Isotope Dose: 23.3 mCi
ST Depression (mm): 0 mm
Stress MBF: 1.99 ml/g/min
Stress Nuclear Isotope Dose: 23.2 mCi

## 2022-10-19 MED ORDER — RUBIDIUM RB82 GENERATOR (RUBYFILL)
23.0000 | PACK | Freq: Once | INTRAVENOUS | Status: AC
  Administered 2022-10-19: 23.3 via INTRAVENOUS

## 2022-10-19 MED ORDER — RUBIDIUM RB82 GENERATOR (RUBYFILL)
23.0000 | PACK | Freq: Once | INTRAVENOUS | Status: AC
  Administered 2022-10-19: 23.2 via INTRAVENOUS

## 2022-10-19 MED ORDER — REGADENOSON 0.4 MG/5ML IV SOLN: 0.4000 mg | Freq: Once | INTRAVENOUS | Status: AC

## 2022-10-19 MED ORDER — REGADENOSON 0.4 MG/5ML IV SOLN
INTRAVENOUS | Status: AC
  Administered 2022-10-19: 0.4 mg via INTRAVENOUS
  Filled 2022-10-19: qty 5

## 2022-10-19 NOTE — Progress Notes (Signed)
Tolerated test well 

## 2022-10-19 NOTE — Progress Notes (Signed)
Issue with NM camera caused delay

## 2022-10-26 ENCOUNTER — Other Ambulatory Visit: Payer: Self-pay | Admitting: Family Medicine

## 2022-10-27 ENCOUNTER — Ambulatory Visit (HOSPITAL_COMMUNITY)
Admission: RE | Admit: 2022-10-27 | Discharge: 2022-10-27 | Disposition: A | Discharge status: Home / Self Care | Payer: 59 | Source: Ambulatory Visit | Attending: Family Medicine | Admitting: Family Medicine

## 2022-10-27 ENCOUNTER — Encounter (HOSPITAL_COMMUNITY): Payer: Self-pay

## 2022-10-27 ENCOUNTER — Telehealth (HOSPITAL_COMMUNITY): Payer: Self-pay | Admitting: Cardiology

## 2022-10-27 VITALS — BP 108/64 | HR 76 | Wt 202.6 lb

## 2022-10-27 DIAGNOSIS — Z79899 Other long term (current) drug therapy: Secondary | ICD-10-CM | POA: Insufficient documentation

## 2022-10-27 DIAGNOSIS — R079 Chest pain, unspecified: Secondary | ICD-10-CM | POA: Insufficient documentation

## 2022-10-27 DIAGNOSIS — I10 Essential (primary) hypertension: Secondary | ICD-10-CM

## 2022-10-27 DIAGNOSIS — G4733 Obstructive sleep apnea (adult) (pediatric): Secondary | ICD-10-CM

## 2022-10-27 DIAGNOSIS — E0590 Thyrotoxicosis, unspecified without thyrotoxic crisis or storm: Secondary | ICD-10-CM | POA: Diagnosis not present

## 2022-10-27 DIAGNOSIS — I5032 Chronic diastolic (congestive) heart failure: Secondary | ICD-10-CM

## 2022-10-27 DIAGNOSIS — N184 Chronic kidney disease, stage 4 (severe): Secondary | ICD-10-CM

## 2022-10-27 DIAGNOSIS — Z7984 Long term (current) use of oral hypoglycemic drugs: Secondary | ICD-10-CM | POA: Insufficient documentation

## 2022-10-27 DIAGNOSIS — E785 Hyperlipidemia, unspecified: Secondary | ICD-10-CM | POA: Diagnosis not present

## 2022-10-27 DIAGNOSIS — E1122 Type 2 diabetes mellitus with diabetic chronic kidney disease: Secondary | ICD-10-CM | POA: Insufficient documentation

## 2022-10-27 DIAGNOSIS — Z01818 Encounter for other preprocedural examination: Secondary | ICD-10-CM

## 2022-10-27 DIAGNOSIS — I130 Hypertensive heart and chronic kidney disease with heart failure and stage 1 through stage 4 chronic kidney disease, or unspecified chronic kidney disease: Secondary | ICD-10-CM | POA: Diagnosis not present

## 2022-10-27 DIAGNOSIS — E669 Obesity, unspecified: Secondary | ICD-10-CM | POA: Diagnosis not present

## 2022-10-27 DIAGNOSIS — Z6834 Body mass index (BMI) 34.0-34.9, adult: Secondary | ICD-10-CM | POA: Insufficient documentation

## 2022-10-27 LAB — BASIC METABOLIC PANEL
Anion gap: 8 (ref 5–15)
BUN: 30 mg/dL — ABNORMAL HIGH (ref 8–23)
CO2: 26 mmol/L (ref 22–32)
Calcium: 8.9 mg/dL (ref 8.9–10.3)
Chloride: 106 mmol/L (ref 98–111)
Creatinine, Ser: 2.24 mg/dL — ABNORMAL HIGH (ref 0.44–1.00)
GFR, Estimated: 24 mL/min — ABNORMAL LOW (ref >60)
Glucose, Bld: 158 mg/dL — ABNORMAL HIGH (ref 70–99)
Potassium: 4 mmol/L (ref 3.5–5.1)
Sodium: 140 mmol/L (ref 135–145)

## 2022-10-27 MED ORDER — TORSEMIDE 20 MG PO TABS: 20.0000 mg | ORAL_TABLET | Freq: Every day | ORAL | 6 refills | Status: DC

## 2022-10-27 NOTE — Addendum Note (Signed)
Encounter addended by: Theresia Bough, CMA on: 10/27/2022 2:12 PM  Actions taken: Order list changed, Diagnosis association updated

## 2022-10-27 NOTE — Telephone Encounter (Signed)
-----   Message from Jacklynn Ganong, [place] sent at 10/27/2022  2:56 PM EDT ----- Kidney function elevated.  Stop torsemide x 2 days, then resume at 20 mg daily. Repeat BMET in 10-14 days

## 2022-10-27 NOTE — Patient Instructions (Signed)
It was great to see you today! No medication changes are needed at this time.   Labs today We will only contact you if something comes back abnormal or we need to make some changes. Otherwise no news is good news!    Your physician wants you to follow-up in: 6 months with Dr McLean. You will receive a reminder letter in the mail two months in advance. If you don't receive a letter, please call our office to schedule the follow-up appointment.   Do the following things EVERYDAY: Weigh yourself in the morning before breakfast. Write it down and keep it in a log. Take your medicines as prescribed Eat low salt foods--Limit salt (sodium) to 2000 mg per day.  Stay as active as you can everyday Limit all fluids for the day to less than 2 liters  At the [facility], you and your health needs are our priority. As part of our continuing mission to provide you with exceptional heart care, we have created designated Provider Care Teams. These Care Teams include your primary Cardiologist (physician) and Advanced Practice Providers (APPs- Physician Assistants and Nurse Practitioners) who all work together to provide you with the care you need, when you need it.   You may see any of the following providers on your designated Care Team at your next follow up: Dr Daniel Bensimhon Dr Dalton McLean Dr. Aditya Sabharwal Amy Clegg, NP Brittainy Simmons, PA Jessica [place]ford,NP Lindsay Finch, PA Alma Diaz, NP Lauren Kemp, PharmD   Please be sure to bring in all your medications bottles to every appointment.    Thank you for choosing [facility]   If you have any questions or concerns before your next appointment please send us a message through mychart or call our office at [phone].    TO LEAVE A MESSAGE FOR THE NURSE SELECT OPTION 2, PLEASE LEAVE A MESSAGE INCLUDING: YOUR NAME DATE OF BIRTH CALL BACK NUMBER REASON FOR CALL**this is  important as we prioritize the call backs  YOU WILL RECEIVE A CALL BACK THE SAME DAY AS LONG AS YOU CALL BEFORE 4:00 PM  

## 2022-10-27 NOTE — Progress Notes (Addendum)
Advanced Heart Failure Clinic Note    PCP: Hoy Register, MD Primary Cardiologist: Rollene Rotunda, MD  HF Cardiologist: Dr. Shirlee Latch  HPI:  [name] [name] is a 62 y.o. with a history of HFpEF, DMII, HTN, OSA, and hyperlipidemia.    Had sleep study 08/18/21 that demonstrated mild obstructive sleep apnea overall (AHI 13.0/h; RDI 14.6/h); however, sleep apnea was moderately severe during REM sleep (AHI 29.4/h. Severe oxygen desaturation was noted during this study (Min O2 54.0%). Time spent < 89% was 81.7 minutes.   Admitted 09/15/21 with decompensated CHF. Diuresed with IV lasix and transitioned to torsemide 40 mg daily.    Admitted 09/22/21 with with decompensated HFpEF. Diuresed with IV lasix and transitioned to torsemide 20 mg daily, weight 233 lbs.  Referred to [facility] clinic. Post hospital follow up 3/23, she was volume overloaded in setting of dietary indiscretion, ReDs 47%. Torsemide increased and CardioMEMs discussed.  Echo in 3/23 showed EF 60-65%, normal RV.   Follow up 5/23. Reds 48%. Torsemide increased to 60 mg twice a day x 2 days then 60 mg daily. Cleda Daub was started.   Follow up 9/23, torsemide reduced to 40 mg daily with elevated SCr. Had episodes of typical and atypical CP and arranged for cardiac PET to assess for ischemia, which showed normal EF and no evidence for ischemia or infarction.  Today she returns for HF follow up. Overall feeling fine. She has heartburn, waxing and waning.  She has SOB walking up steps. No further CP. Denies palpitations,  dizziness, edema, or PND/Orthopnea. Appetite ok. No fever or chills. Weight at home 196-197 pounds. Taking all medications. She does not wear CPAP. 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  REDs: 31%  ECG (personally reviewed): NSR 79 bpm  Labs (4/23): K 4.0, creatinine 1.84 Labs (6/23): K 4.2, creatinine 1.78  Labs (7/23): LDL 45 Labs (8/23): TSH < 0.076 Labs (9/23):  K 4.1, creatinine 1.91 Labs (2/24): K 4.1, creatinine 1.67  PMH: 1. CKD stage 3 2. HCV 3. HTN: Angioedema with ACEI.  4. Type 2 diabetes 5. OSA 6. Hyperthyroidism 7. Chronic diastolic CHF: Echo in 3/23 with EF 60-65%, normal RV.  - cardiac PET 4/24 (for chest pain) showed EF 66% (rest), normal low risk study, no evidence for ischemia/infarction; minimum calcium in LAD, mildly abn MBF could represent microvascular disease  Current Outpatient Medications  Medication Sig Dispense Refill   albuterol (VENTOLIN HFA) 108 (90 Base) MCG/ACT inhaler Inhale 2 puffs into the lungs every 4 (four) hours as needed for wheezing or shortness of breath. 1 each 1   amLODipine (NORVASC) 2.5 MG tablet Take 1 tablet (2.5 mg total) by mouth daily. 90 tablet 1   atorvastatin (LIPITOR) 40 MG tablet Take 1 tablet (40 mg total) by mouth every morning. 90 tablet 1   carvedilol (COREG) 25 MG tablet TAKE ONE TABLET BY MOUTH twice daily 180 tablet 3   empagliflozin (JARDIANCE) 10 MG TABS tablet Take 1 tablet (10 mg total) by mouth daily. 90 tablet 1   famotidine (PEPCID) 40 MG tablet TAKE ONE TABLET BY MOUTH ONCE DAILY 30 tablet 3   ferrous sulfate 325 (65 FE) MG tablet Take 325 mg by mouth daily with breakfast.     hydrALAZINE (APRESOLINE) 50 MG tablet Take 1.5 tablets (75 mg total) by mouth 3 (three) times daily. 135 tablet 8   hydrocortisone-pramoxine (ANALPRAM HC) 2.5-1 % rectal cream Place 1 Application rectally 3 (three) times daily.  30 g 1   mometasone-formoterol (DULERA) 100-5 MCG/ACT AERO Inhale 2 puffs into the lungs as needed for wheezing.     nystatin ointment (MYCOSTATIN) Apply 1 application. topically 2 (two) times daily as needed (rash/skin irritation). 30 g 1   oxybutynin (DITROPAN) 5 MG tablet Take 1 tablet (5 mg total) by mouth 2 (two) times daily. 60 tablet 6   pregabalin (LYRICA) 75 MG capsule Take 1 capsule (75 mg total) by mouth 2 (two) times daily. 180 capsule 1   Semaglutide, 1 MG/DOSE, 4 MG/3ML  SOPN Inject 1 mg as directed once a week. 3 mL 6   spironolactone (ALDACTONE) 25 MG tablet TAKE 1/2 TABLET BY MOUTH daily 30 tablet 3   torsemide (DEMADEX) 20 MG tablet Take 20 mg alternating with 40 mg daily 90 tablet 6   No current facility-administered medications for this encounter.   Allergies  Allergen Reactions   Lisinopril Swelling   Social History   Socioeconomic History   Marital status: Legally Separated    Spouse name: Not on file   Number of children: 1   Years of education: Not on file   Highest education level: 9th grade  Occupational History   Occupation: disability  Tobacco Use   Smoking status: Never   Smokeless tobacco: Never  Vaping Use   Vaping Use: Never used  Substance and Sexual Activity   Alcohol use: No   Drug use: Not Currently    Types: Cocaine, Marijuana    Comment: remote h/o cocaine 2015 and marijuana 2018   Sexual activity: Not Currently  Other Topics Concern   Not on file  Social History Narrative   Lives with friend, Harlow MaresGodson and nephew.  One daughter.    Social Determinants of Health   Financial Resource Strain: Low Risk  (07/22/2022)   Overall Financial Resource Strain (CARDIA)    Difficulty of Paying Living Expenses: Not very hard  Food Insecurity: No Food Insecurity (07/02/2022)   Hunger Vital Sign    Worried About Running Out of Food in the Last Year: Never true    Ran Out of Food in the Last Year: Never true  Transportation Needs: No Transportation Needs (07/02/2022)   PRAPARE - Administrator, Civil ServiceTransportation    Lack of Transportation (Medical): No    Lack of Transportation (Non-Medical): No  Physical Activity: Insufficiently Active (10/07/2022)   Exercise Vital Sign    Days of Exercise per Week: 2 days    Minutes of Exercise per Session: 10 min  Stress: No Stress Concern Present (10/07/2022)   [employer] - Occupational Stress Questionnaire    Feeling of Stress : Not at all  Social Connections: Moderately Isolated  (07/02/2022)   Social Connection and Isolation Panel [NHANES]    Frequency of Communication with Friends and Family: More than three times a week    Frequency of Social Gatherings with Friends and Family: More than three times a week    Attends Religious Services: More than 4 times per year    Active Member of [employer] Financial[employer]ubs or Organizations: No    Attends BankerClub or Organization Meetings: Never    Marital Status: Divorced  Catering managerntimate Partner Violence: Not At Risk (07/22/2022)   Humiliation, Afraid, Rape, and Kick questionnaire    Fear of Current or Ex-Partner: No    Emotionally Abused: No    Physically Abused: No    Sexually Abused: No   Family History  Problem Relation Age of Onset   Colon cancer Mother  Liver disease Sister    Other Neg Hx    Breast cancer Neg Hx    Esophageal cancer Neg Hx    Rectal cancer Neg Hx    Tremor Neg Hx    BP 108/64   Pulse 76   Wt 91.9 kg (202 lb 9.6 oz)   LMP 04/01/2010   SpO2 96%   BMI 34.78 kg/m   Wt Readings from Last 3 Encounters:  10/27/22 91.9 kg (202 lb 9.6 oz)  09/06/22 90 kg (198 lb 6.4 oz)  08/10/22 87.2 kg (192 lb 3.2 oz)   PHYSICAL EXAM: General:  NAD. No resp difficulty, walked into clinic HEENT: Normal Neck: Supple. No JVD. Carotids 2+ bilat; no bruits. No lymphadenopathy or thryomegaly appreciated. Cor: PMI nondisplaced. Regular rate & rhythm. No rubs, gallops or murmurs. Lungs: Clear Abdomen: Soft, nontender, nondistended. No hepatosplenomegaly. No bruits or masses. Good bowel sounds. Extremities: No cyanosis, clubbing, rash, edema Neuro: Alert & oriented x 3, cranial nerves grossly intact. Moves all 4 extremities w/o difficulty. Affect pleasant.  ASSESSMENT & PLAN: 1. Chronic HFpEF: Echo (3/23) with EF 60-65% Grade II DD.  She has had several HF admissions in the last year.  With diastolic CHF, reasonable to assess for cardiac amyloidosis. PYP scan has been arranged but not scheduled. Will follow up on this today. myeloma panel and  urine immunofixation normal. NYHA class II, She is not volume overloaded on exam, weights stable, REDs 33%. - Continue torsemide 40 mg daily alternating with 20  mg every other day. BMET today. - Continue spironolactone 12.5 mg daily.  - Continue Jardiance 10 mg daily. No GU symptoms. - Continue Coreg 25 mg bid. - Insurance denied approval for Cardiomems.  2. OSA: Not wearing CPAP. 3. Obesity: Body mass index is 34.78 kg/m. - She is on Ozempic. 4. CKD Stage III: Continue SGLT2i. Sees nephrology.   - BMET today.  5. Hypertension: BP stable. - Continue current medications. 6. Hyperthyroidism: She is now off methimazole and followed by endocrinology.  7. Chest pain: Cardiac PET (2/24) negative for ischemia/infarction. Mildly abnormal MBF could represent microvascular disease in setting of LVH. No further CP. 8. Pre Operative Clearance: she is stable from a HF perspective, and at acceptable cardiac risk for her lumpectomy.  Follow up in 6 months with Dr. Shirlee Latch.  Anderson Malta Draper, FNP 10/27/22

## 2022-10-27 NOTE — Telephone Encounter (Signed)
Patient called.  Patient aware.  

## 2022-10-27 NOTE — Progress Notes (Signed)
ReDS Vest / Clip - 10/27/22 1200       ReDS Vest / Clip   Station Marker A    Ruler Value 31    ReDS Value Range Low volume    ReDS Actual Value 33

## 2022-10-28 NOTE — Telephone Encounter (Signed)
Patient was seen by [name] Rome, FNP in advanced heart failure clinic on 10/27/2022. It is noted that she is an acceptable risk for lumpectomy. Note forwarded to requesting physician and request removed preop pool.    Etta Grandchild. Tru Rana, DNP, NP-C  10/28/2022, 11:03 AM [facility] [address] Office [phone] Fax [phone]

## 2022-10-28 NOTE — Telephone Encounter (Signed)
Pt is needing an in office appt with [facility] per Jari Favre, PAC , see notes

## 2022-10-28 NOTE — Telephone Encounter (Signed)
See previous notes; please disregard pt needs in office appt with [facility] noted today. Pt had appt with Dr. Shirlee Latch yesterday 10/27/22. I will forward to pre op APP and Dr. Shirlee Latch if the pt has been cleared.

## 2022-11-03 ENCOUNTER — Telehealth: Payer: Self-pay | Admitting: *Deleted

## 2022-11-03 ENCOUNTER — Other Ambulatory Visit: Payer: Self-pay | Admitting: Surgery

## 2022-11-03 DIAGNOSIS — D242 Benign neoplasm of left breast: Secondary | ICD-10-CM

## 2022-11-08 NOTE — Telephone Encounter (Signed)
PYP auth denied

## 2022-11-09 ENCOUNTER — Other Ambulatory Visit: Payer: 59 | Admitting: Obstetrics and Gynecology

## 2022-11-09 NOTE — Patient Instructions (Signed)
Hi [name] [name], hope you are doing okay and sorry I missed you today- as a part of your Medicaid benefit, you are eligible for care management and care coordination services at no cost or copay. I was unable to reach you by phone today but would be happy to help you with your health related needs. Please feel free to call me at [phone],  A member of the Managed Medicaid care management team will reach out to you again over the next 30 business days.   Kathi Der RN, BSN [place]  [employer], production - Managed Medicaid High Risk [phone]

## 2022-11-09 NOTE — Patient Outreach (Signed)
  Medicaid Managed Care   Unsuccessful Attempt Note   11/09/2022 Name: NASREEN GOEDECKE MRN: [id number] DOB: 23-Oct-1960  Referred by: Hoy Register, MD Reason for referral : High Risk Managed Medicaid (Unsuccessful telephone outreach)  An unsuccessful telephone outreach was attempted today. The patient was referred to the case management team for assistance with care management and care coordination.    Follow Up Plan: The Managed Medicaid care management team will reach out to the patient again over the next 30 business  days. and The  Patient has been provided with contact information for the Managed Medicaid care management team and has been advised to call with any health related questions or concerns.   Kathi Der RN, BSN [place]  [employer] production - Managed Medicaid High Risk [phone]

## 2022-11-10 ENCOUNTER — Ambulatory Visit (HOSPITAL_COMMUNITY)
Admission: RE | Admit: 2022-11-10 | Discharge: 2022-11-10 | Disposition: A | Discharge status: Home / Self Care | Payer: 59 | Source: Ambulatory Visit | Attending: Cardiology | Admitting: Cardiology

## 2022-11-10 DIAGNOSIS — I5032 Chronic diastolic (congestive) heart failure: Secondary | ICD-10-CM | POA: Diagnosis not present

## 2022-11-10 LAB — BASIC METABOLIC PANEL
Anion gap: 9 (ref 5–15)
BUN: 36 mg/dL — ABNORMAL HIGH (ref 8–23)
CO2: 22 mmol/L (ref 22–32)
Calcium: 9.2 mg/dL (ref 8.9–10.3)
Chloride: 109 mmol/L (ref 98–111)
Creatinine, Ser: 2.17 mg/dL — ABNORMAL HIGH (ref 0.44–1.00)
GFR, Estimated: 25 mL/min — ABNORMAL LOW (ref >60)
Glucose, Bld: 143 mg/dL — ABNORMAL HIGH (ref 70–99)
Potassium: 3.9 mmol/L (ref 3.5–5.1)
Sodium: 140 mmol/L (ref 135–145)

## 2022-11-12 ENCOUNTER — Other Ambulatory Visit (HOSPITAL_COMMUNITY): Payer: Self-pay

## 2022-11-12 ENCOUNTER — Telehealth (HOSPITAL_COMMUNITY): Payer: Self-pay | Admitting: Emergency Medicine

## 2022-11-12 DIAGNOSIS — I110 Hypertensive heart disease with heart failure: Secondary | ICD-10-CM

## 2022-11-12 DIAGNOSIS — G4733 Obstructive sleep apnea (adult) (pediatric): Secondary | ICD-10-CM

## 2022-11-12 DIAGNOSIS — E1149 Type 2 diabetes mellitus with other diabetic neurological complication: Secondary | ICD-10-CM

## 2022-11-12 DIAGNOSIS — I5032 Chronic diastolic (congestive) heart failure: Secondary | ICD-10-CM

## 2022-11-12 DIAGNOSIS — E1122 Type 2 diabetes mellitus with diabetic chronic kidney disease: Secondary | ICD-10-CM

## 2022-11-12 MED ORDER — HYDRALAZINE HCL 50 MG PO TABS: 75.0000 mg | ORAL_TABLET | Freq: Three times a day (TID) | ORAL | 8 refills | Status: DC

## 2022-11-12 MED ORDER — ATORVASTATIN CALCIUM 40 MG PO TABS: 40.0000 mg | ORAL_TABLET | Freq: Every morning | ORAL | 1 refills | Status: DC

## 2022-11-12 MED ORDER — TORSEMIDE 20 MG PO TABS: 20.0000 mg | ORAL_TABLET | Freq: Every day | ORAL | 6 refills | Status: DC

## 2022-11-12 MED ORDER — CARVEDILOL 25 MG PO TABS: 25.0000 mg | ORAL_TABLET | Freq: Two times a day (BID) | ORAL | 3 refills | Status: DC

## 2022-11-12 MED ORDER — SPIRONOLACTONE 25 MG PO TABS: 12.5000 mg | ORAL_TABLET | Freq: Every day | ORAL | 3 refills | Status: DC

## 2022-11-12 MED ORDER — EMPAGLIFLOZIN 10 MG PO TABS: 10.0000 mg | ORAL_TABLET | Freq: Every day | ORAL | 1 refills | Status: DC

## 2022-11-12 MED ORDER — AMLODIPINE BESYLATE 2.5 MG PO TABS: 2.5000 mg | ORAL_TABLET | Freq: Every day | ORAL | 1 refills | Status: DC

## 2022-11-12 NOTE — Telephone Encounter (Signed)
Called [place] to let her know  I have followed up with HF Clinic to have  her prescriptions be sent to [facility] as we discussed on Wednesday 11/10/22 when I saw her in [facility].    Beatrix Shipper, EMT-Paramedic [phone] 11/12/2022

## 2022-11-15 ENCOUNTER — Encounter (HOSPITAL_COMMUNITY): Payer: 59 | Admitting: Cardiology

## 2022-11-16 ENCOUNTER — Other Ambulatory Visit: Payer: 59 | Admitting: Obstetrics and Gynecology

## 2022-11-16 NOTE — Patient Instructions (Signed)
Visit Information  [name]  - as a part of your Medicaid benefit, you are eligible for care management and care coordination services at no cost or copay. I was unable to reach you by phone today but would be happy to help you with your health related needs. Please feel free to call me at [phone].  A member of the Managed Medicaid care management team will reach out to you again over the next 30 business  days.   Kathi Der RN, BSN [place]  [employer], production - Managed Medicaid High Risk [phone]

## 2022-11-16 NOTE — Patient Outreach (Signed)
  Medicaid Managed Care   Unsuccessful Attempt Note   11/16/2022 Name: BRANDEE MARKIN MRN: [id number] DOB: [birth year]  Referred by: Hoy Register, MD Reason for referral : High Risk Managed Medicaid (Unsuccessful telephone outreach)  A second unsuccessful telephone outreach was attempted today. The patient was referred to the case management team for assistance with care management and care coordination.    Follow Up Plan: The Managed Medicaid care management team will reach out to the patient again over the next 30 business  days. and The  Patient has been provided with contact information for the Managed Medicaid care management team and has been advised to call with any health related questions or concerns.   Kathi Der RN, BSN [place]  [employer], production - Managed Medicaid High Risk [phone]

## 2022-11-18 ENCOUNTER — Telehealth (HOSPITAL_COMMUNITY): Payer: Self-pay

## 2022-11-18 ENCOUNTER — Other Ambulatory Visit (HOSPITAL_COMMUNITY): Payer: Self-pay

## 2022-11-18 DIAGNOSIS — G4733 Obstructive sleep apnea (adult) (pediatric): Secondary | ICD-10-CM

## 2022-11-18 MED ORDER — HYDRALAZINE HCL 25 MG PO TABS: 25.0000 mg | ORAL_TABLET | Freq: Three times a day (TID) | ORAL | 3 refills | Status: DC

## 2022-11-18 MED ORDER — HYDRALAZINE HCL 50 MG PO TABS: 50.0000 mg | ORAL_TABLET | Freq: Three times a day (TID) | ORAL | 8 refills | Status: DC

## 2022-11-18 NOTE — Telephone Encounter (Signed)
error 

## 2022-11-25 ENCOUNTER — Other Ambulatory Visit: Payer: Self-pay

## 2022-11-26 ENCOUNTER — Encounter: Payer: Self-pay | Admitting: Obstetrics and Gynecology

## 2022-11-26 ENCOUNTER — Other Ambulatory Visit: Payer: 59 | Admitting: Obstetrics and Gynecology

## 2022-11-26 NOTE — Patient Outreach (Signed)
Medicaid Managed Care   Nurse Care Manager Note  11/26/2022 Name:  [name] [name] MRN:  [id number] DOB:  1961-03-01  [name] [name] is an 62 y.o. year old female who is a primary patient of [name] Register, MD.  The [facility] Managed Care Coordination team was consulted for assistance with:    Chronic healthcare management needs, DM, HTN, COPD, OSA, CKD, CHF, HLD, hypothyroid, neuropathy  [name] [name] was given information about Medicaid Managed Care Coordination team services today. [place] Patient agreed to services and verbal consent obtained.  Engaged with patient by telephone for follow up visit in response to provider referral for case management and/or care coordination services.   Assessments/Interventions:  Review of past medical history, allergies, medications, health status, including review of consultants reports, laboratory and other test data, was performed as part of comprehensive evaluation and provision of chronic care management services.  SDOH (Social Determinants of Health) assessments and interventions performed: SDOH Interventions    Flowsheet Row Patient Outreach Telephone from 11/26/2022 in [place] POPULATION HEALTH DEPARTMENT Patient Outreach Telephone from 10/07/2022 in [place] POPULATION HEALTH DEPARTMENT Patient Outreach Telephone from 07/22/2022 in [place] POPULATION HEALTH DEPARTMENT Patient Outreach Telephone from 04/13/2022 in [facility] Care Coordination Patient Outreach Telephone from 02/04/2022 in [facility] Care Coordination Patient Outreach Telephone from 12/29/2021 in [facility] Coordination  SDOH Interventions        Food Insecurity Interventions Intervention Not Indicated -- -- -- -- --  Utilities Interventions -- -- -- Intervention Not Indicated -- --  Alcohol Usage Interventions Intervention Not Indicated (Score <7) -- -- -- -- --  Financial Strain  Interventions -- -- Intervention Not Indicated -- -- Intervention Not Indicated  Physical Activity Interventions -- Other (Comments)  [not able to engage at exercise this level.] -- -- Other (Comments)  [patient not physically able to engage in a moderate to strenuous exercise] --  Stress Interventions -- Intervention Not Indicated -- Intervention Not Indicated -- --     Care Plan  Allergies  Allergen Reactions   Lisinopril Swelling    Medications Reviewed Today     Reviewed by Danie Chandler, RN (Registered Nurse) on 11/26/22 at 1309  Med List Status: <None>   Medication Order Taking? Sig Documenting Provider Last Dose Status Informant  albuterol (VENTOLIN HFA) 108 (90 Base) MCG/ACT inhaler [id number] No Inhale 2 puffs into the lungs every 4 (four) hours as needed for wheezing or shortness of breath. Cathren Laine, MD Taking Active   amLODipine (NORVASC) 2.5 MG tablet [id number]  Take 1 tablet (2.5 mg total) by mouth daily. [place], [place], [place]  Active   atorvastatin (LIPITOR) 40 MG tablet [id number]  Take 1 tablet (40 mg total) by mouth every morning. [place], [place], [place]  Active   carvedilol (COREG) 25 MG tablet [id number]  Take 1 tablet (25 mg total) by mouth 2 (two) times daily. [place], [place], [place]  Active   empagliflozin (JARDIANCE) 10 MG TABS tablet [id number]  Take 1 tablet (10 mg total) by mouth daily. [place], [place], [place]  Active   famotidine (PEPCID) 40 MG tablet [id number] No TAKE ONE TABLET BY MOUTH ONCE DAILY [name] Register, MD Taking Active   ferrous sulfate 325 (65 FE) MG tablet [id number] No Take 325 mg by mouth daily with breakfast. [provider] Taking Active   hydrALAZINE (APRESOLINE) 25 MG tablet [id number]  Take 1 tablet (25 mg total) by mouth 3 (  three) times daily. [place], [place], [place]  Active   hydrALAZINE (APRESOLINE) 50 MG tablet [id number]  Take 1 tablet (50 mg total) by mouth 3 (three) times daily. [place], [place], [place]  Active    hydrocortisone-pramoxine [facility]) 2.5-1 % rectal cream [id number] No Place 1 Application rectally 3 (three) times daily. [name] Register, MD Taking Active   mometasone-formoterol [facility]) 100-5 MCG/ACT AERO [id number] No Inhale 2 puffs into the lungs as needed for wheezing. [provider] Taking Active   nystatin ointment (MYCOSTATIN) [id number] No Apply 1 application. topically 2 (two) times daily as needed (rash/skin irritation). [name] Register, MD Taking Active            Med Note Katrinka Blazing, [name]   Tue Apr 20, 2022 11:23 AM)    oxybutynin (DITROPAN) 5 MG tablet [id number] No Take 1 tablet (5 mg total) by mouth 2 (two) times daily. [name] Register, MD Taking Active   pregabalin (LYRICA) 75 MG capsule [id number] No Take 1 capsule (75 mg total) by mouth 2 (two) times daily. [name] Register, MD Taking Active   Semaglutide, 1 MG/DOSE, 4 MG/3ML SOPN [id number] No Inject 1 mg as directed once a week. [name] Register, MD Taking Active   spironolactone (ALDACTONE) 25 MG tablet [id number]  Take 0.5 tablets (12.5 mg total) by mouth daily. [place], [place], [place]  Active   torsemide (DEMADEX) 20 MG tablet [id number]  Take 1 tablet (20 mg total) by mouth daily. 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, with long-term current use of insulin, unspecified retinopathy severity (HCC) 09/06/2022   Graves disease 04/23/2022   Heart failure (HCC) 09/22/2021   CKD (chronic kidney disease) stage 4, GFR 15-29 ml/min (HCC) 07/03/2021   COVID-19 05/05/2021   CHF exacerbation (HCC) 04/02/2021   Chest pain 04/02/2021   Acute-on-chronic kidney injury (HCC) 04/02/2021   COPD exacerbation (HCC) 12/05/2020   CKD (chronic kidney disease), stage III (HCC) 12/04/2020   Anemia of chronic disease 12/04/2020   Obesity, Class III, BMI 40-49.9 (morbid obesity) (HCC) 07/27/2020    Acute respiratory disease due to COVID-19 virus 07/25/2020   Morbid obesity (HCC) 11/23/2019   Chronic kidney disease, stage 3b (HCC) 11/06/2019   COPD without exacerbation (HCC)    Acute kidney failure (HCC) 08/18/2019   Left ventricular hypertrophy 07/21/2019   Educated about COVID-19 virus infection 07/21/2019   Acute diastolic HF (heart failure) (HCC)    Hypoxia    OSA (obstructive sleep apnea) 05/09/2019   Hyperglycemia 04/09/2019   Snoring 03/13/2019   Chronic anemia 08/17/2018   Chronic cystitis 08/03/2018   Diabetic neuropathy (HCC) 06/16/2017   Non compliance w medication regimen 04/12/2017   Family history of colon cancer in mother 11/17/2016   Dyspareunia in female 11/11/2016   Screen for colon cancer 11/11/2016   History of ovarian cyst 11/11/2016   Chronic hepatitis C without hepatic coma (HCC) 11/09/2016   Hyperlipidemia 10/07/2016   Type 2 diabetes mellitus with hyperlipidemia (HCC) 01/29/2014   Status post intraocular lens implant 11/08/2013   PCO (posterior capsular opacification) 09/28/2013   Pseudophakia 09/12/2013   GERD (gastroesophageal reflux disease) 09/06/2013   Hypertension 09/06/2013   ANEMIA, IRON DEFICIENCY 10/03/2009   LIPOMA 06/18/2009   TINEA PEDIS 05/07/2009   SUBSTANCE ABUSE, MULTIPLE 02/22/2007   HCVD (hypertensive cardiovascular disease) 02/22/2007  Conditions to be addressed/monitored per PCP order:  Chronic healthcare management needs, DM, HTN, COPD, OSA, CKD, CHF, HLD, hypothyroid, neuropathy  Care Plan : General Plan of Care (Adult)  Updates made by Danie Chandler, RN since 11/26/2022 12:00 AM     Problem: Quality of Life (General Plan of Care)   Priority: High  Onset Date: 10/29/2020     Long-Range Goal: Quality of Life Maintained   Start Date: 10/29/2020  Expected End Date: 02/26/2023  Recent Progress: Not on track  Priority: High  Note:    CARE PLAN ENTRY Medicaid Managed Care (see longitudinal plan of care for additional  care plan information)  Current Barriers:  Chronic case management needs related to chronic health conditions, HTN, DM, COPD, OSA, CKD, CHF, HLD, hypothyroid 11/26/22:  Patient with no complaints today-to have lumpectomy next week.  Blood sugars 100s per patient-no recent BP reading.  No breathing difficulty.  Nurse Case Manager Clinical Goal(s):  Over the next 30 days, patient will verbalize understanding of plan for decreased swelling.  Over the next 30 days, patient will work with provider to address needs  Over the next 30 days, patient will continue to work with Pharmacist. Over the next 30 days, patient will attend all scheduled appointments.  Interventions:  Inter-disciplinary care team collaboration (see longitudinal plan of care) Reviewed medications with patient. Discussed plans with patient for ongoing care management follow up and provided patient with direct contact information for care management team. Collaboration with Pharmacist for review of medications. Pharmacy referral for medication review. Collaborated with BSW for resources. BSW  Referral for resources-completed Collaborated with PCP for GI referral at patient request Care Guide referral for Medicare information Collaborated with Care Guide.  Heart Failure Interventions:  (Status:  New goal.) Long Term Goal Basic overview and discussion of pathophysiology of Heart Failure reviewed Provided education on low sodium diet Assessed need for readable accurate scales in home Provided education about placing scale on hard, flat surface Advised patient to weigh each morning after emptying bladder Discussed importance of daily weight and advised patient to weigh and record daily Discussed the importance of keeping all appointments with provider Assessed social determinant of health barriers    Chronic Kidney Disease Interventions:  (Status:  New goal.) Long Term Goal Evaluation of current treatment plan related to  chronic kidney disease self management and patient's adherence to plan as established by provider      Reviewed prescribed diet  Reviewed medications with patient and discussed importance of compliance    Counseled on the importance of exercise goals with target of 150 minutes per week     Advised patient, providing education and rationale, to monitor blood pressure daily and record, calling PCP for findings outside established parameters    Discussed complications of poorly controlled blood pressure such as heart disease, stroke, circulatory complications, vision complications, kidney impairment, sexual dysfunction    Reviewed scheduled/upcoming provider appointments  Discussed plans with patient for ongoing care management follow up and provided patient with direct contact information for care management team    Assessed social determinant of health barriers    Last practice recorded BP readings:  BP Readings from Last 3 Encounters:  09/14/22 (!) 156/82  09/06/22 133/83  08/10/22 122/78   Most recent eGFR/CrCl:  Lab Results  Component Value Date   EGFR 32 (L) 12/21/2021    No components found for: "CRCL"  COPD Interventions:  (Status:  New goal.) Long Term Goal Advised patient to track  and manage COPD triggers Advised patient to self assesses COPD action plan zone and make appointment with provider if in the yellow zone for 48 hours without improvement Advised patient to engage in light exercise as tolerated 3-5 days a week to aid in the the management of COPD Provided education about and advised patient to utilize infection prevention strategies to reduce risk of respiratory infection Discussed the importance of adequate rest and management of fatigue with COPD Assessed social determinant of health barriers  Diabetes Interventions:  (Status:  New goal.) Long Term Goal Assessed patient's understanding of A1c goal: <7% Reviewed medications with patient and discussed importance of  medication adherence Counseled on importance of regular laboratory monitoring as prescribed Discussed plans with patient for ongoing care management follow up and provided patient with direct contact information for care management team Reviewed scheduled/upcoming provider appointments  Advised patient, providing education and rationale, to check cbg as directed  and record, calling provider for findings outside established parameters Review of patient status, including review of consultants reports, relevant laboratory and other test results, and medications completed Assessed social determinant of health barriers Lab Results  Component Value Date   HGBA1C 6.5 09/06/2022  Hyperlipidemia Interventions:  (Status:  New goal.) Long Term Goal Medication review performed; medication list updated in electronic medical record.  Counseled on importance of regular laboratory monitoring as prescribed Reviewed importance of limiting foods high in cholesterol Assessed social determinant of health barriers   Hypertension Interventions:  (Status:  New goal.) Long Term Goal Last practice recorded BP readings:  BP Readings from Last 3 Encounters:  09/14/22 (!) 156/82  09/06/22 133/83  08/10/22 122/78   Most recent eGFR/CrCl:  Lab Results  Component Value Date   EGFR 32 (L) 12/21/2021    No components found for: "CRCL"  Evaluation of current treatment plan related to hypertension self management and patient's adherence to plan as established by provider Reviewed medications with patient and discussed importance of compliance Discussed plans with patient for ongoing care management follow up and provided patient with direct contact information for care management team Reviewed scheduled/upcoming provider appointments including:  Discussed complications of poorly controlled blood pressure such as heart disease, stroke, circulatory complications, vision complications, kidney impairment, sexual  dysfunction Assessed social determinant of health barriers   Plan:  Patient will follow up with provider and fill all prescriptions RNCM will follow up with patient within 45  business days.   Follow Up:  Patient agrees to Care Plan and Follow-up.  Plan: The Managed Medicaid care management team will reach out to the patient again over the next 30 business  days. and The  Patient has been provided with contact information for the Managed Medicaid care management team and has been advised to call with any health related questions or concerns.  Date/time of next scheduled RN care management/care coordination outreach:  01/03/23 ay 0900.

## 2022-11-26 NOTE — Patient Instructions (Signed)
Hi [name] [name]-great to speak with you this afternoon-have a great weekend!  [name] [name] was given information about Medicaid Managed Care team care coordination services as a part of their [facility] Community Plan Medicaid benefit. [name] [name] verbally consented to engagement with the [facility] Managed Care team.   If you are experiencing a medical emergency, please call 911 or report to your local emergency department or urgent care.   If you have a non-emergency medical problem during routine business hours, please contact your provider's office and ask to speak with a nurse.   For questions related to your [facility], please call: [phone] or visit the homepage here: [email or web address]  If you would like to schedule transportation through your [facility], please call the following number at least 2 days in advance of your appointment: [phone]   Rides for urgent appointments can also be made after hours by calling Member Services.  Call the Behavioral Health Crisis Line at [phone], at any time, 24 hours a day, 7 days a week. If you are in danger or need immediate medical attention call 911.  If you would like help to quit smoking, call 1-800-QUIT-NOW (684-859-2023) OR Espaol: 1-855-Djelo-Ya (7-253-664-4034) o para ms informacin haga clic aqu or Text READY to 742-595 to register via text  [name] - following are the goals we discussed in your visit today:   Goals Addressed             This Visit's Progress    Protect My Health       Timeframe:  Long-Range Goal Priority:  Medium Start Date:          10/29/20                   Expected End Date: ongoing             Follow Up Date: 01/03/23   - schedule appointment for flu shot - schedule appointment for vaccines needed due to my age or health - schedule recommended health tests (blood  work, mammogram, colonoscopy, pap test) - schedule and keep appointment for annual check-up   11/26/22:  patient to have breast lumpectomy next week.  PCP 5/20  Why is this important?   Screening tests can find diseases early when they are easier to treat.  Your doctor or nurse will talk with you about which tests are important for you.  Getting shots for common diseases like the flu and shingles will help prevent them.    Patient verbalizes understanding of instructions and care plan provided today and agrees to view in MyChart. Active MyChart status and patient understanding of how to access instructions and care plan via MyChart confirmed with patient.     The Managed Medicaid care management team will reach out to the patient again over the next 45 business  days.  The  Patient has been provided with contact information for the Managed Medicaid care management team and has been advised to call with any health related questions or concerns.   Kathi Der RN, BSN [place]  [facility] Care Management Coordinator - Managed Medicaid High Risk [phone]   Following is a copy of your plan of care:  Care Plan : General Plan of Care (Adult)  Updates made by Danie Chandler, RN since 11/26/2022 12:00 AM     Problem: Quality of Life (General Plan of Care)   Priority: High  Onset Date: 10/29/2020  Long-Range Goal: Quality of Life Maintained   Start Date: 10/29/2020  Expected End Date: 02/26/2023  Recent Progress: Not on track  Priority: High  Note:    CARE PLAN ENTRY Medicaid Managed Care (see longitudinal plan of care for additional care plan information)  Current Barriers:  Chronic case management needs related to chronic health conditions, HTN, DM, COPD, OSA, CKD, CHF, HLD, hypothyroid 11/26/22:  Patient with no complaints today-to have lumpectomy next week.  Blood sugars 100s per patient-no recent BP reading.  No breathing difficulty.  Nurse Case Manager  Clinical Goal(s):  Over the next 30 days, patient will verbalize understanding of plan for decreased swelling.  Over the next 30 days, patient will work with provider to address needs  Over the next 30 days, patient will continue to work with Pharmacist. Over the next 30 days, patient will attend all scheduled appointments.  Interventions:  Inter-disciplinary care team collaboration (see longitudinal plan of care) Reviewed medications with patient. Discussed plans with patient for ongoing care management follow up and provided patient with direct contact information for care management team. Collaboration with Pharmacist for review of medications. Pharmacy referral for medication review. Collaborated with BSW for resources. BSW  Referral for resources-completed Collaborated with PCP for GI referral at patient request Care Guide referral for Medicare information Collaborated with Care Guide.  Heart Failure Interventions:  (Status:  New goal.) Long Term Goal Basic overview and discussion of pathophysiology of Heart Failure reviewed Provided education on low sodium diet Assessed need for readable accurate scales in home Provided education about placing scale on hard, flat surface Advised patient to weigh each morning after emptying bladder Discussed importance of daily weight and advised patient to weigh and record daily Discussed the importance of keeping all appointments with provider Assessed social determinant of health barriers    Chronic Kidney Disease Interventions:  (Status:  New goal.) Long Term Goal Evaluation of current treatment plan related to chronic kidney disease self management and patient's adherence to plan as established by provider      Reviewed prescribed diet  Reviewed medications with patient and discussed importance of compliance    Counseled on the importance of exercise goals with target of 150 minutes per week     Advised patient, providing education and  rationale, to monitor blood pressure daily and record, calling PCP for findings outside established parameters    Discussed complications of poorly controlled blood pressure such as heart disease, stroke, circulatory complications, vision complications, kidney impairment, sexual dysfunction    Reviewed scheduled/upcoming provider appointments  Discussed plans with patient for ongoing care management follow up and provided patient with direct contact information for care management team    Assessed social determinant of health barriers    Last practice recorded BP readings:  BP Readings from Last 3 Encounters:  09/14/22 (!) 156/82  09/06/22 133/83  08/10/22 122/78   Most recent eGFR/CrCl:  Lab Results  Component Value Date   EGFR 32 (L) 12/21/2021    No components found for: "CRCL"  COPD Interventions:  (Status:  New goal.) Long Term Goal Advised patient to track and manage COPD triggers Advised patient to self assesses COPD action plan zone and make appointment with provider if in the yellow zone for 48 hours without improvement Advised patient to engage in light exercise as tolerated 3-5 days a week to aid in the the management of COPD Provided education about and advised patient to utilize infection prevention strategies to reduce risk of respiratory infection  Discussed the importance of adequate rest and management of fatigue with COPD Assessed social determinant of health barriers  Diabetes Interventions:  (Status:  New goal.) Long Term Goal Assessed patient's understanding of A1c goal: <7% Reviewed medications with patient and discussed importance of medication adherence Counseled on importance of regular laboratory monitoring as prescribed Discussed plans with patient for ongoing care management follow up and provided patient with direct contact information for care management team Reviewed scheduled/upcoming provider appointments  Advised patient, providing education and  rationale, to check cbg as directed  and record, calling provider for findings outside established parameters Review of patient status, including review of consultants reports, relevant laboratory and other test results, and medications completed Assessed social determinant of health barriers Lab Results  Component Value Date   HGBA1C 6.5 09/06/2022  Hyperlipidemia Interventions:  (Status:  New goal.) Long Term Goal Medication review performed; medication list updated in electronic medical record.  Counseled on importance of regular laboratory monitoring as prescribed Reviewed importance of limiting foods high in cholesterol Assessed social determinant of health barriers   Hypertension Interventions:  (Status:  New goal.) Long Term Goal Last practice recorded BP readings:  BP Readings from Last 3 Encounters:  09/14/22 (!) 156/82  09/06/22 133/83  08/10/22 122/78   Most recent eGFR/CrCl:  Lab Results  Component Value Date   EGFR 32 (L) 12/21/2021    No components found for: "CRCL"  Evaluation of current treatment plan related to hypertension self management and patient's adherence to plan as established by provider Reviewed medications with patient and discussed importance of compliance Discussed plans with patient for ongoing care management follow up and provided patient with direct contact information for care management team Reviewed scheduled/upcoming provider appointments including:  Discussed complications of poorly controlled blood pressure such as heart disease, stroke, circulatory complications, vision complications, kidney impairment, sexual dysfunction Assessed social determinant of health barriers   Plan:  Patient will follow up with provider and fill all prescriptions RNCM will follow up with patient within 45  business days.

## 2022-11-30 ENCOUNTER — Other Ambulatory Visit: Payer: Self-pay | Admitting: Family Medicine

## 2022-11-30 DIAGNOSIS — N3946 Mixed incontinence: Secondary | ICD-10-CM

## 2022-11-30 DIAGNOSIS — Z794 Long term (current) use of insulin: Secondary | ICD-10-CM

## 2022-11-30 MED ORDER — PREGABALIN 75 MG PO CAPS: 75.0000 mg | ORAL_CAPSULE | Freq: Two times a day (BID) | ORAL | 1 refills | Status: DC

## 2022-11-30 MED ORDER — FAMOTIDINE 40 MG PO TABS: 40.0000 mg | ORAL_TABLET | Freq: Every day | ORAL | 1 refills | Status: DC

## 2022-11-30 MED ORDER — OXYBUTYNIN CHLORIDE 5 MG PO TABS: 5.0000 mg | ORAL_TABLET | Freq: Two times a day (BID) | ORAL | 6 refills | Status: DC

## 2022-11-30 MED ORDER — SEMAGLUTIDE (1 MG/DOSE) 4 MG/3ML ~~LOC~~ SOPN: 1.0000 mg | PEN_INJECTOR | SUBCUTANEOUS | 2 refills | Status: DC

## 2022-11-30 NOTE — Telephone Encounter (Signed)
Requested medication (s) are due for refill today: Lyrica and Carboxymethylcellul gtts are not  Requested medication (s) are on the active medication list: Yes all  Last refill: Lyrica  09/16/22  #180  1 refill Albuterol 09/14/22  1 each  1 refill   Carboxymethylcellul  gtts 11/30/22  Future visit scheduled Yes 12/06/22  Notes to clinic: Lyrica is not delegated. Other meds historical provider. Cannot refuse non -delegated meds.  Requested Prescriptions  Pending Prescriptions Disp Refills   pregabalin (LYRICA) 75 MG capsule 180 capsule     Sig: Take 1 capsule (75 mg total) by mouth 2 (two) times daily.     Not Delegated - Neurology:  Anticonvulsants - Controlled - pregabalin Failed - 11/30/2022  3:42 PM      Failed - This refill cannot be delegated      Failed - Cr in normal range and within 360 days    Creat  Date Value Ref Range Status  12/12/2012 0.88 0.50 - 1.10 mg/dL Final   Creatinine, Ser  Date Value Ref Range Status  11/10/2022 2.17 (H) 0.44 - 1.00 mg/dL Final   Creatinine,U  Date Value Ref Range Status  06/13/2007 45.3 mg/dL Final    Comment:    See lab report for associated comment(s)   Creatinine, Urine  Date Value Ref Range Status  09/15/2021 47 mg/dL Final    Comment:    Performed at [facility], [address], [place], [place] [address]         Passed - Completed PHQ-2 or PHQ-9 in the last 360 days      Passed - Valid encounter within last 12 months    Recent Outpatient Visits           2 months ago Type 2 diabetes mellitus with other neurologic complication, with long-term current use of insulin (HCC)   [place] [facility] [place], Odette Horns, MD   6 months ago Type 2 diabetes mellitus with other neurologic complication, with long-term current use of insulin (HCC)   [place] [facility] [place], [place], MD   9 months ago 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 Hoy Register, MD   1 year ago Hospital discharge follow-up   [place] [facility] [place], Jomarie Longs, FNP       Future Appointments             In 6 days Hoy Register, MD  [facility]             albuterol (VENTOLIN HFA) 108 (90 Base) MCG/ACT inhaler 1 each     Sig: Inhale 2 puffs into the lungs every 4 (four) hours as needed for wheezing or shortness of breath.     Pulmonology:  Beta Agonists 2 Passed - 11/30/2022  3:42 PM      Passed - Last BP in normal range    BP Readings from Last 1 Encounters:  10/27/22 108/64         Passed - Last Heart Rate in normal range    Pulse Readings from Last 1 Encounters:  10/27/22 76         Passed -  Valid encounter within last 12 months    Recent Outpatient Visits           2 months ago Type 2 diabetes mellitus with other neurologic complication, with long-term current use of insulin (HCC)   [place] [facility] [place], [place], MD   6 months ago Type 2 diabetes mellitus with other neurologic complication, with long-term current use of insulin (HCC)   [place] [facility] [place], [place], MD   9 months ago 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 [facility])   [place] [facility] [place], Odette Horns, MD   1 year ago Type 2 diabetes mellitus with other neurologic complication, with long-term current use of insulin [facility])   [place] [facility] Hoy Register, MD   1 year ago Hospital discharge follow-up   [place] [facility] Eleonore Chiquito, [place]       Future  Appointments             In 6 days Hoy Register, MD [facility]             carboxymethylcellul-glycerin (LUBRICATING EYE DROPS) 0.5-0.9 % ophthalmic solution      Sig: Place into both eyes daily as needed (dry eyes).     Ophthalmology:  Okey Regal - 11/30/2022  3:42 PM      Passed - Valid encounter within last 12 months    Recent Outpatient Visits           2 months ago Type 2 diabetes mellitus with other neurologic complication, with long-term current use of insulin (HCC)   [place] [facility] 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 [facility])   [place] [facility] [place], Odette Horns, MD   1 year ago Type 2 diabetes mellitus with other neurologic complication, with long-term current use of insulin [facility])   [place] [facility] Hoy Register, MD   1 year ago Hospital discharge follow-up   [facility] Eleonore Chiquito, [place]       Future Appointments             In 6 days Hoy Register, MD [facility]            Signed Prescriptions Disp Refills   Semaglutide, 1 MG/DOSE, 4 MG/3ML SOPN 3 mL 2    Sig: Inject 1 mg as directed once a week.     Endocrinology:  Diabetes - GLP-1 Receptor Agonists - semaglutide Failed - 11/30/2022  3:42 PM      Failed - Cr in normal range and within 360 days    Creat  Date Value Ref Range Status  12/12/2012 0.88 0.50 - 1.10 mg/dL Final   Creatinine, Ser  Date Value Ref Range Status  11/10/2022 2.17 (H) 0.44 - 1.00 mg/dL Final   Creatinine,U  Date Value Ref Range Status  06/13/2007 45.3  mg/dL Final    Comment:    See lab report for associated comment(s)   Creatinine, Urine  Date Value Ref Range  Status  09/15/2021 47 mg/dL Final    Comment:    Performed at [facility], [address] [place], [place] [address]         Passed - HBA1C in normal range and within 180 days    HbA1c, POC (controlled diabetic range)  Date Value Ref Range Status  09/06/2022 6.5 0.0 - 7.0 % Final         Passed - Valid encounter within last 6 months    Recent Outpatient Visits           2 months ago Type 2 diabetes mellitus with other neurologic complication, with long-term current use of insulin (HCC)   [place] [facility] [place], [place], MD   6 months ago Type 2 diabetes mellitus with other neurologic complication, with long-term current use of insulin (HCC)   [place] [facility] [place], [place], MD   9 months ago 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 [facility])   [place] [facility] [place], [place], MD   1 year ago Type 2 diabetes mellitus with other neurologic complication, with long-term current use of insulin [facility])   [place] [facility] [place] [facility] Hoy Register, MD   1 year ago Hospital discharge follow-up   [facility] [place], Jomarie Longs, FNP       Future Appointments             In 6 days Hoy Register, MD [facility]             oxybutynin (DITROPAN) 5 MG tablet 60 tablet 6    Sig: Take 1 tablet (5 mg total) by mouth 2 (two) times daily.     Urology:  Bladder Agents Passed - 11/30/2022  3:42 PM      Passed - Valid encounter within last 12 months    Recent Outpatient Visits           2 months ago Type 2 diabetes mellitus with other neurologic complication, with long-term current use of insulin (HCC)   [place] [facility] [place], [place], MD   6 months ago Type 2 diabetes mellitus with other neurologic complication, with long-term current use of insulin (HCC)   [place] [facility] [facility] [place], [place], MD   9 months ago 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 [facility])   [place] [facility] [place], Odette Horns, MD   1 year ago Type 2 diabetes mellitus with other neurologic complication, with long-term current use of insulin [facility])   [place] [facility] Hoy Register, MD   1 year ago Hospital discharge follow-up   [place] [facility] Eleonore Chiquito, FNP       Future Appointments             In 6 days Hoy Register, MD [facility]            Refused Prescriptions Disp Refills   famotidine (PEPCID) 40 MG tablet 30 tablet     Sig: Take 1 tablet (40 mg total) by mouth daily.     Gastroenterology:  H2 Antagonists Passed - 11/30/2022  3:42 PM      Passed - Valid encounter within last 12 months    Recent  Outpatient Visits           2 months ago Type 2 diabetes mellitus with other neurologic complication, with long-term current use of insulin (HCC)   [place] [facility] [place], [place], MD   6 months ago Type 2 diabetes mellitus with other neurologic complication, with long-term current use of insulin (HCC)   [place] [facility] [place], Odette Horns, MD   9 months ago 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)   [place] [facility] [place], Odette Horns, MD   1 year ago Type 2 diabetes mellitus with other neurologic complication, with long-term current use of insulin (HCC)   [place] [facility] [place], Odette Horns, MD   1 year ago Hospital discharge follow-up   [place] [facility] [place], Jomarie Longs, FNP       Future Appointments             In 6 days Hoy Register, MD [facility]             ferrous sulfate 325 (65 FE) MG tablet      Sig: Take 1 tablet (325 mg total) by mouth daily with breakfast.     Endocrinology:  Minerals - Iron Supplementation Failed - 11/30/2022  3:42 PM      Failed - HGB in normal range and within 360 days    Hemoglobin  Date Value Ref Range Status  09/14/2022 11.4 (L) 12.0 - 15.0 g/dL Final  16/04/9603 9.8 (L) 11.1 - 15.9 g/dL Final         Failed - Fe (serum) in normal range and within 360 days    Iron  Date Value Ref Range Status  09/16/2021 37 28 - 170 ug/dL Final   Saturation Ratios  Date Value Ref Range Status  09/16/2021 15 10.4 - 31.8 % Final         Failed - Ferritin in normal range and within 360 days    Ferritin  Date Value Ref Range Status  09/22/2021 247 11 - 307 ng/mL Final    Comment:    Performed at [facility], [address] [place], [place] [address]         Passed - HCT in normal range and within 360 days    HCT  Date Value Ref Range Status  09/14/2022 38.1 36.0 - 46.0 % Final   Hematocrit  Date Value Ref Range Status  07/10/2019 30.9 (L) 34.0 - 46.6 % Final         Passed - RBC in normal range and within 360 days    RBC  Date Value Ref Range Status  09/14/2022 4.04 3.87 - 5.11 MIL/uL Final         Passed - Valid encounter within last 12 months    Recent Outpatient Visits           2 months ago Type 2 diabetes mellitus with other neurologic complication, with long-term current use of insulin (HCC)   [place] [facility] [place], [place], MD   6 months ago Type 2 diabetes mellitus with other neurologic complication, with long-term current use of insulin [facility])   [place] [facility] [place], Odette Horns, MD   9 months ago Hypertension associated with chronic kidney disease  due to type 2 diabetes mellitus (HCC)  [place] [facility] [place], [place], MD   1 year ago Type 2 diabetes mellitus with other neurologic complication, with long-term current use of insulin [facility])   [place] [facility] Hoy Register, MD   1 year ago Hospital discharge follow-up   [place] [facility] Eleonore Chiquito, [place]       Future Appointments             In 6 days Hoy Register, MD [facility]             mometasone-formoterol [facility]) 100-5 MCG/ACT AERO 1 each     Sig: Inhale 2 puffs into the lungs as needed for wheezing.     Pulmonology:  Combination Products Passed - 11/30/2022  3:42 PM      Passed - Valid encounter within last 12 months    Recent Outpatient Visits           2 months ago Type 2 diabetes mellitus with other neurologic complication, with long-term current use of insulin (HCC)   [place] [facility] [place], [place], MD   6 months ago Type 2 diabetes mellitus with other neurologic complication, with long-term current use of insulin (HCC)   [place] [facility] [place], [place], MD   9 months ago 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 [facility])   [place] [facility] [place], Odette Horns, MD   1 year ago Type 2 diabetes mellitus with other neurologic complication, with long-term current use of insulin [facility])   [place] [facility] Hoy Register, MD   1 year ago Hospital discharge follow-up   [facility] Eleonore Chiqu[place], [place]       Future Appointments             In 6 days Hoy Register, MD [facility]

## 2022-11-30 NOTE — Telephone Encounter (Signed)
Requested Prescriptions  Pending Prescriptions Disp Refills   pregabalin (LYRICA) 75 MG capsule 180 capsule     Sig: Take 1 capsule (75 mg total) by mouth 2 (two) times daily.     Not Delegated - Neurology:  Anticonvulsants - Controlled - pregabalin Failed - 11/30/2022  3:42 PM      Failed - This refill cannot be delegated      Failed - Cr in normal range and within 360 days    Creat  Date Value Ref Range Status  12/12/2012 0.88 0.50 - 1.10 mg/dL Final   Creatinine, Ser  Date Value Ref Range Status  11/10/2022 2.17 (H) 0.44 - 1.00 mg/dL Final   Creatinine,U  Date Value Ref Range Status  06/13/2007 45.3 mg/dL Final    Comment:    See lab report for associated comment(s)   Creatinine, Urine  Date Value Ref Range Status  09/15/2021 47 mg/dL Final    Comment:    Performed at [facility], [address], [place], [place] [address]         Passed - Completed PHQ-2 or PHQ-9 in the last 360 days      Passed - Valid encounter within last 12 months    Recent Outpatient Visits           2 months ago Type 2 diabetes mellitus with other neurologic complication, with long-term current use of insulin (HCC)   [place] [facility] [place], Odette Horns, MD   6 months ago Type 2 diabetes mellitus with other neurologic complication, with long-term current use of insulin (HCC)   [place] [facility] [place], [place], MD   9 months ago 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)   [place] [facility] [place], [place], MD   1 year ago Type 2 diabetes mellitus with other neurologic complication, with long-term current use of insulin (HCC)   [place] [facility] Hoy Register, MD   1 year ago Hospital discharge follow-up   [facility] [place], Jomarie Longs, FNP       Future Appointments             In 6  days Hoy Register, MD [facility]             oxybutynin (DITROPAN) 5 MG tablet 60 tablet 6    Sig: Take 1 tablet (5 mg total) by mouth 2 (two) times daily.     Urology:  Bladder Agents Passed - 11/30/2022  3:42 PM      Passed - Valid encounter within last 12 months    Recent Outpatient Visits           2 months ago Type 2 diabetes mellitus with other neurologic complication, with long-term current use of insulin (HCC)   [place] [facility] [place], [place], MD   6 months ago Type 2 diabetes mellitus with other neurologic complication, with long-term current use of insulin (HCC)   [place] [facility] [place], [place], MD   9 months ago 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 [facility])   [place] [facility] [place], Odette Horns, MD   1 year ago Type 2 diabetes mellitus with other neurologic complication, with long-term current use of insulin (HCC)   [place] [facility] Hoy Register, MD   1 year ago Hospital discharge follow-up   [facility] [place], Jomarie Longs, [place]       Future Appointments             In 6 days Hoy Register, MD [facility]             albuterol (VENTOLIN HFA) 108 [phone] Base) MCG/ACT inhaler 1 each     Sig: Inhale 2 puffs into the lungs every 4 (four) hours as needed for wheezing or shortness of breath.     Pulmonology:  Beta Agonists 2 Passed - 11/30/2022  3:42 PM      Passed - Last BP in normal range    BP Readings from Last 1 Encounters:  10/27/22 108/64         Passed - Last Heart Rate in normal range    Pulse Readings from Last 1 Encounters:  10/27/22 76         Passed - Valid encounter within last 12 months    Recent Outpatient Visits           2 months ago Type 2 diabetes mellitus with other  neurologic complication, with long-term current use of insulin (HCC)   [place] [facility] [place], [place], MD   6 months ago Type 2 diabetes mellitus with other neurologic complication, with long-term current use of insulin (HCC)   [place] [facility] [place], [place], MD   9 months ago 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)   [place] [facility] [place], [place], MD   1 year ago Type 2 diabetes mellitus with other neurologic complication, with long-term current use of insulin [facility])   [place] [facility] Hoy Register, MD   1 year ago Hospital discharge follow-up   [place] [facility] Eleonore Chiquito, [place]       Future Appointments             In 6 days Hoy Register, MD [facility]             carboxymethylcellul-glycerin (LUBRICATING EYE DROPS) 0.5-0.9 % ophthalmic solution      Sig: Place into both eyes daily as needed (dry eyes).     Ophthalmology:  Okey Regal - 11/30/2022  3:42 PM      Passed - Valid encounter within last 12 months    Recent Outpatient Visits           2 months ago Type 2 diabetes mellitus with other neurologic complication, with long-term current use of insulin (HCC)   [place] [facility] [place], [place], MD   6 months ago Type 2 diabetes mellitus with other neurologic complication, with long-term current use of insulin (HCC)   [place] [facility] [place], [place], MD   9 months ago 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 [facility])   [place] [facility] [place], Odette Horns, MD   1 year ago Type 2 diabetes mellitus with other neurologic complication, with long-term current use of insulin [facility])    [facility] Hoy Register, MD   1 year ago Hospital discharge follow-up   [place] [facility]  Center Eleonore Chiquito, FNP       Future Appointments             In 6 days Hoy Register, MD [facility]            Signed Prescriptions Disp Refills   Semaglutide, 1 MG/DOSE, 4 MG/3ML SOPN 3 mL 2    Sig: Inject 1 mg as directed once a week.     Endocrinology:  Diabetes - GLP-1 Receptor Agonists - semaglutide Failed - 11/30/2022  3:42 PM      Failed - Cr in normal range and within 360 days    Creat  Date Value Ref Range Status  12/12/2012 0.88 0.50 - 1.10 mg/dL Final   Creatinine, Ser  Date Value Ref Range Status  11/10/2022 2.17 (H) 0.44 - 1.00 mg/dL Final   Creatinine,U  Date Value Ref Range Status  06/13/2007 45.3 mg/dL Final    Comment:    See lab report for associated comment(s)   Creatinine, Urine  Date Value Ref Range Status  09/15/2021 47 mg/dL Final    Comment:    Performed at [facility], [address], [place], [place] [address]         Passed - HBA1C in normal range and within 180 days    HbA1c, POC (controlled diabetic range)  Date Value Ref Range Status  09/06/2022 6.5 0.0 - 7.0 % Final         Passed - Valid encounter within last 6 months    Recent Outpatient Visits           2 months ago Type 2 diabetes mellitus with other neurologic complication, with long-term current use of insulin (HCC)   [place] [facility] [place], Odette Horns, MD   6 months ago Type 2 diabetes mellitus with other neurologic complication, with long-term current use of insulin (HCC)   [place] [facility] [place], [place], MD   9 months ago 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)   [place] [facility] [place], [place], MD   1 year ago Type 2 diabetes mellitus with other  neurologic complication, with long-term current use of insulin [facility])   [place] [facility] Hoy Register, MD   1 year ago Hospital discharge follow-up   [place] [facility] Eleonore Chiquito, FNP       Future Appointments             In 6 days Hoy Register, MD [facility]            Refused Prescriptions Disp Refills   famotidine (PEPCID) 40 MG tablet 30 tablet     Sig: Take 1 tablet (40 mg total) by mouth daily.     Gastroenterology:  H2 Antagonists Passed - 11/30/2022  3:42 PM      Passed - Valid encounter within last 12 months    Recent Outpatient Visits           2 months ago Type 2 diabetes mellitus with other neurologic complication, with long-term current use of insulin (HCC)   [place] [place] [facility] [place], Odette Horns, MD   6 months ago Type 2 diabetes mellitus with other neurologic complication, with long-term current use of insulin [facility])    [facility] Hoy Register, MD  9 months ago 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)   [place] [facility] [place], [place], MD   1 year ago Type 2 diabetes mellitus with other neurologic complication, with long-term current use of insulin (HCC)   [place] [facility] [place], Odette Horns, MD   1 year ago Hospital discharge follow-up   [place] [facility] [place], Jomarie Longs, FNP       Future Appointments             In 6 days Hoy Register, MD [facility]             ferrous sulfate 325 (65 FE) MG tablet      Sig: Take 1 tablet (325 mg total) by mouth daily with breakfast.     Endocrinology:  Minerals - Iron Supplementation Failed - 11/30/2022  3:42 PM      Failed - HGB in normal range and within 360 days     Hemoglobin  Date Value Ref Range Status  09/14/2022 11.4 (L) 12.0 - 15.0 g/dL Final  16/04/9603 9.8 (L) 11.1 - 15.9 g/dL Final         Failed - Fe (serum) in normal range and within 360 days    Iron  Date Value Ref Range Status  09/16/2021 37 28 - 170 ug/dL Final   Saturation Ratios  Date Value Ref Range Status  09/16/2021 15 10.4 - 31.8 % Final         Failed - Ferritin in normal range and within 360 days    Ferritin  Date Value Ref Range Status  09/22/2021 247 11 - 307 ng/mL Final    Comment:    Performed at [facility], [address] [place], [place] [address]         Passed - HCT in normal range and within 360 days    HCT  Date Value Ref Range Status  09/14/2022 38.1 36.0 - 46.0 % Final   Hematocrit  Date Value Ref Range Status  07/10/2019 30.9 (L) 34.0 - 46.6 % Final         Passed - RBC in normal range and within 360 days    RBC  Date Value Ref Range Status  09/14/2022 4.04 3.87 - 5.11 MIL/uL Final         Passed - Valid encounter within last 12 months    Recent Outpatient Visits           2 months ago Type 2 diabetes mellitus with other neurologic complication, with long-term current use of insulin (HCC)   [place] [facility] [place], [place], MD   6 months ago Type 2 diabetes mellitus with other neurologic complication, with long-term current use of insulin (HCC)   [place] [facility] [place], [place], MD   9 months ago 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 [facility])   [place] [facility] [place], Odette Horns, MD   1 year ago Type 2 diabetes mellitus with other neurologic complication, with long-term current use of insulin [facility])   [place] [facility] Center Hoy Register, MD   1 year ago Hospital discharge follow-up   [facility] Eleonore Chiquito, [place]       Future  Appointments  In 6 days Hoy Register, MD [facility]             mometasone-formoterol [name]) 100-5 MCG/ACT AERO 1 each     Sig: Inhale 2 puffs into the lungs as needed for wheezing.     Pulmonology:  Combination Products Passed - 11/30/2022  3:42 PM      Passed - Valid encounter within last 12 months    Recent Outpatient Visits           2 months ago Type 2 diabetes mellitus with other neurologic complication, with long-term current use of insulin (HCC)   [place] [facility]ness Center [place], [place], MD   6 months ago Type 2 diabetes mellitus with other neurologic complication, with long-term current use of insulin (HCC)   [place] [facility] [place], [place], MD   9 months ago 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 [facility])   [place] [facility] [place], Odette Horns, MD   1 year ago Type 2 diabetes mellitus with other neurologic complication, with long-term current use of insulin [facility])   [place] [address] [facility] & [facility]ness Center Hoy Register, MD   1 year ago Hospital discharge follow-up   [facility] Eleonore Chiqu[place], [place]       Future Appointments             In 6 days Hoy Register, MD [facility]

## 2022-11-30 NOTE — Telephone Encounter (Unsigned)
Copied from CRM [id number]. Topic: General - Other >> Nov 30, 2022  2:43 PM Everette C wrote: Reason for CRM: Medication Refill - Medication: pregabalin (LYRICA) 75 MG capsule [[id number]]  Semaglutide, 1 MG/DOSE, 4 MG/3ML SOPN [[id number]]  oxybutynin (DITROPAN) 5 MG tablet [[id number]]  famotidine (PEPCID) 40 MG tablet [[id number]]  ferrous sulfate 325 (65 FE) MG tablet [[id number]]   mometasone-formoterol (DULERA) 100-5 MCG/ACT AERO [[id number]]  albuterol (PROVENTIL HFA;VENTOLIN HFA) 108 (90 Base) MCG/ACT inhaler 2 puff   [[id number]]  Eye drops   Has the patient contacted their pharmacy? Yes.   (Agent: If no, request that the patient contact the pharmacy for the refill. If patient does not wish to contact the pharmacy document the reason why and proceed with request.) (Agent: If yes, when and what did the pharmacy advise?)  Preferred Pharmacy (with phone number or street name): [facility] [place], [place] - [address] [place] [address] Phone: [phone] Fax: [phone] Hours: Not open 24 hours   Has the patient been seen for an appointment in the last year OR does the patient have an upcoming appointment? Yes.    Agent: Please be advised that RX refills may take up to 3 business days. We ask that you follow-up with your pharmacy.

## 2022-11-30 NOTE — Telephone Encounter (Signed)
Requested Prescriptions  Pending Prescriptions Disp Refills   pregabalin (LYRICA) 75 MG capsule 180 capsule     Sig: Take 1 capsule (75 mg total) by mouth 2 (two) times daily.     Not Delegated - Neurology:  Anticonvulsants - Controlled - pregabalin Failed - 11/30/2022  3:42 PM      Failed - This refill cannot be delegated      Failed - Cr in normal range and within 360 days    Creat  Date Value Ref Range Status  12/12/2012 0.88 0.50 - 1.10 mg/dL Final   Creatinine, Ser  Date Value Ref Range Status  11/10/2022 2.17 (H) 0.44 - 1.00 mg/dL Final   Creatinine,U  Date Value Ref Range Status  06/13/2007 45.3 mg/dL Final    Comment:    See lab report for associated comment(s)   Creatinine, Urine  Date Value Ref Range Status  09/15/2021 47 mg/dL Final    Comment:    Performed at [facility], [address] [place], [place] [address]         Passed - Completed PHQ-2 or PHQ-9 in the last 360 days      Passed - Valid encounter within last 12 months    Recent Outpatient Visits           2 months ago Type 2 diabetes mellitus with other neurologic complication, with long-term current use of insulin (HCC)   [place] [facility] [place], Odette Horns, MD   6 months ago Type 2 diabetes mellitus with other neurologic complication, with long-term current use of insulin (HCC)   [place] [facility] [place], [place], MD   9 months ago 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)   [place] [facility] [place], [place], MD   1 year ago Type 2 diabetes mellitus with other neurologic complication, with long-term current use of insulin (HCC)   [place] [facility] Hoy Register, MD   1 year ago Hospital discharge follow-up   [place] [facility] Eleonore Chiquito, [place]       Future Appointments             In 6  days Hoy Register, MD [place] [facility]             Semaglutide, 1 MG/DOSE, 4 MG/3ML SOPN 3 mL 2    Sig: Inject 1 mg as directed once a week.     Endocrinology:  Diabetes - GLP-1 Receptor Agonists - semaglutide Failed - 11/30/2022  3:42 PM      Failed - Cr in normal range and within 360 days    Creat  Date Value Ref Range Status  12/12/2012 0.88 0.50 - 1.10 mg/dL Final   Creatinine, Ser  Date Value Ref Range Status  11/10/2022 2.17 (H) 0.44 - 1.00 mg/dL Final   Creatinine,U  Date Value Ref Range Status  06/13/2007 45.3 mg/dL Final    Comment:    See lab report for associated comment(s)   Creatinine, Urine  Date Value Ref Range Status  09/15/2021 47 mg/dL Final    Comment:    Performed at [facility], [address] [place], [place] [address]         Passed - HBA1C in normal range and within 180 days    HbA1c, POC (controlled diabetic range)  Date Value Ref Range  Status  09/06/2022 6.5 0.0 - 7.0 % Final         Passed - Valid encounter within last 6 months    Recent Outpatient Visits           2 months ago Type 2 diabetes mellitus with other neurologic complication, with long-term current use of insulin (HCC)   [place] [facility] [place], [place], MD   6 months ago Type 2 diabetes mellitus with other neurologic complication, with long-term current use of insulin (HCC)   [place] [facility] [place], [place], MD   9 months ago 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 [facility])   [place] [facility] [place], [place], MD   1 year ago Type 2 diabetes mellitus with other neurologic complication, with long-term current use of insulin [facility])   [place] [facility] Hoy Register, MD   1 year ago Hospital discharge follow-up   [facility] [place], Jomarie Longs,  FNP       Future Appointments             In 6 days Hoy Register, MD [place]             oxybutynin (DITROPAN) 5 MG tablet 60 tablet 6    Sig: Take 1 tablet (5 mg total) by mouth 2 (two) times daily.     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 [facility])   [place] [facility] [place], Odette Horns, MD   1 year ago Type 2 diabetes mellitus with other neurologic complication, with long-term current use of insulin [facility])   [facility] Hoy Register, MD   1 year ago Hospital discharge follow-up   [facility] [place], Jomarie Longs, FNP       Future Appointments             In 6 days Hoy Register, MD [facility]             famotidine (PEPCID) 40 MG tablet 30 tablet     Sig: Take 1 tablet (40 mg total) by mouth daily.     Gastroenterology:  H2 Antagonists Passed - 11/30/2022  3:42 PM      Passed - Valid encounter within last 12 months    Recent Outpatient Visits           2 months ago Type 2 diabetes mellitus with other neurologic complication, with long-term current use of insulin (HCC)   [place] 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 [place], Jomarie Longs, FNP       Future Appointments             In 6 days Hoy Register, MD [facility]             ferrous sulfate 325 (65 FE) MG tablet      Sig: Take 1 tablet (325 mg total) by mouth daily with breakfast.     Endocrinology:  Minerals - Iron Supplementation Failed - 11/30/2022  3:42 PM      Failed - HGB in normal range and within 360 days    Hemoglobin  Date Value Ref Range Status  09/14/2022 11.4 (L) 12.0 - 15.0 g/dL Final  16/04/9603 9.8 (L) 11.1 - 15.9 g/dL Final         Failed - Fe (serum) in normal range and within 360 days    Iron  Date Value Ref Range Status  09/16/2021 37 28 - 170 ug/dL Final   Saturation Ratios  Date Value Ref Range Status  09/16/2021 15 10.4 - 31.8 % Final         Failed - Ferritin in normal range and within 360 days    Ferritin  Date Value Ref Range Status  09/22/2021 247 11 - 307 ng/mL Final    Comment:    Performed at [facility], [address], [place], [place] [address]         Passed - HCT in normal range and within 360 days    HCT  Date Value Ref Range Status  09/14/2022 38.1 36.0 - 46.0 % Final   Hematocrit  Date Value Ref Range Status  07/10/2019 30.9 (L) 34.0 - 46.6 % Final         Passed - RBC in normal range and within 360 days    RBC  Date Value Ref Range Status  09/14/2022 4.04 3.87 - 5.11 MIL/uL Final          Passed - Valid encounter within last 12 months    Recent Outpatient Visits           2 months ago Type 2 diabetes mellitus with other neurologic complication, with long-term current use of insulin (HCC)   [place] [facility] [place], [place], MD   6 months ago Type 2 diabetes mellitus with other neurologic complication, with long-term current use of insulin (HCC)   [place] [facility] [place], [place], MD   9 months ago 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 [facility])   [place] [facility] [place], Odette Horns, MD   1 year ago Type 2 diabetes mellitus with other neurologic complication, with long-term current use of insulin [facility])   [place] [facility] Hoy Register, MD   1 year ago Hospital discharge follow-up   [facility] [place],  Jomarie Longs, FNP       Future Appointments             In 6 days Hoy Register, MD [facility]             mometasone-formoterol [facility]) 100-5 MCG/ACT AERO 1 each     Sig: Inhale 2 puffs into the lungs as needed for wheezing.     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 [facility])   [place] [facility] [place], [place], MD   1 year  ago Type 2 diabetes mellitus with other neurologic complication, with long-term current use of insulin [facility])   [place] [facility] Hoy Register, MD   1 year ago Hospital discharge follow-up   [facility] [place], Jomarie Longs, FNP       Future Appointments             In 6 days Hoy Register, MD [place] [facility]             albuterol (VENTOLIN HFA) 108 (90 Base) MCG/ACT inhaler 1 each     Sig: Inhale 2 puffs into the lungs every 4 (four) hours as needed for wheezing or shortness of breath.     Pulmonology:  Beta Agonists 2 Passed - 11/30/2022  3:42 PM      Passed - Last BP in normal range    BP Readings from Last 1 Encounters:  10/27/22 108/64         Passed - Last Heart Rate in normal range    Pulse Readings from Last 1 Encounters:  10/27/22 76         Passed - Valid encounter within last 12 months    Recent Outpatient Visits           2 months ago Type 2 diabetes mellitus with other neurologic complication, with long-term current use of insulin (HCC)   [place] [facility] [place], [place], MD   6 months ago Type 2 diabetes mellitus with other neurologic complication, with long-term current use of insulin (HCC)   [place] [facility] [place], [place], MD   9 months ago 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)   [place] [facility] Hoy Register, MD   1 year ago Hospital discharge follow-up   [facility] Eleonore Chiqu[place], [place]       Future Appointments             In 6  days Hoy Register, MD [place] [facility]             carboxymethylcellul-glycerin (LUBRICATING EYE DROPS) 0.5-0.9 % ophthalmic solution      Sig: Place into both eyes daily as needed (dry eyes).     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 Hoy Register, MD   1 year ago Hospital discharge follow-up   [facility] Eleonore Chiqu[place], [place]       Future Appointments             In 6 days Hoy Register, MD [facility]

## 2022-12-01 ENCOUNTER — Encounter (HOSPITAL_COMMUNITY): Payer: Self-pay | Admitting: Surgery

## 2022-12-01 ENCOUNTER — Ambulatory Visit
Admission: RE | Admit: 2022-12-01 | Discharge: 2022-12-01 | Disposition: A | Discharge status: Home / Self Care | Payer: 59 | Source: Ambulatory Visit | Attending: Surgery | Admitting: Surgery

## 2022-12-01 DIAGNOSIS — Z969 Presence of functional implant, unspecified: Secondary | ICD-10-CM | POA: Diagnosis not present

## 2022-12-01 DIAGNOSIS — D242 Benign neoplasm of left breast: Secondary | ICD-10-CM

## 2022-12-01 HISTORY — PX: BREAST BIOPSY: SHX20

## 2022-12-01 MED ORDER — ALBUTEROL SULFATE HFA 108 (90 BASE) MCG/ACT IN AERS: 2.0000 | INHALATION_SPRAY | RESPIRATORY_TRACT | 0 refills | Status: DC | PRN

## 2022-12-01 MED ORDER — CARBOXYMETHYLCELLUL-GLYCERIN 0.5-0.9 % OP SOLN: 1.0000 [drp] | Freq: Every day | OPHTHALMIC | 0 refills | Status: DC | PRN

## 2022-12-01 MED ORDER — PREGABALIN 75 MG PO CAPS: 75.0000 mg | ORAL_CAPSULE | Freq: Two times a day (BID) | ORAL | 1 refills | Status: DC

## 2022-12-01 NOTE — Progress Notes (Signed)
Patient voiced understanding of arrival time of 0530 tomorrow and nothing to eat or drink after midnight.  PATIENT'S LAST DOSE OF OZEMPIC WAS TUESDAY 11/30/2022. ANESTHESIA MADE AWARE, BUT SINCE SEED WAS PLACED YESTERDAY SHE WAS INSTRUCTED TO STILL COME IN.   Attempted to give medication instructions, but her medications come in pill pack and she doesn't know what any of her pills are. instructed her not to take any of them.   Cardiac clearance is in the chart.

## 2022-12-02 ENCOUNTER — Ambulatory Visit (HOSPITAL_COMMUNITY)
Admission: RE | Admit: 2022-12-02 | Discharge: 2022-12-02 | Disposition: A | Discharge status: Home / Self Care | Payer: 59 | Attending: Surgery | Admitting: Surgery

## 2022-12-02 ENCOUNTER — Other Ambulatory Visit: Payer: Self-pay

## 2022-12-02 ENCOUNTER — Encounter (HOSPITAL_COMMUNITY): Payer: Self-pay | Admitting: Surgery

## 2022-12-02 ENCOUNTER — Ambulatory Visit (HOSPITAL_COMMUNITY): Payer: 59 | Admitting: Registered Nurse

## 2022-12-02 ENCOUNTER — Ambulatory Visit (HOSPITAL_BASED_OUTPATIENT_CLINIC_OR_DEPARTMENT_OTHER): Payer: 59 | Admitting: Registered Nurse

## 2022-12-02 ENCOUNTER — Ambulatory Visit
Admission: RE | Admit: 2022-12-02 | Discharge: 2022-12-02 | Disposition: A | Discharge status: Home / Self Care | Payer: 59 | Source: Ambulatory Visit | Attending: Surgery | Admitting: Surgery

## 2022-12-02 ENCOUNTER — Encounter (HOSPITAL_COMMUNITY)
Admission: RE | Disposition: A | Discharge status: Home / Self Care | Payer: Self-pay | Source: Home / Self Care | Attending: Surgery

## 2022-12-02 DIAGNOSIS — N189 Chronic kidney disease, unspecified: Secondary | ICD-10-CM | POA: Insufficient documentation

## 2022-12-02 DIAGNOSIS — I509 Heart failure, unspecified: Secondary | ICD-10-CM | POA: Diagnosis not present

## 2022-12-02 DIAGNOSIS — Z7984 Long term (current) use of oral hypoglycemic drugs: Secondary | ICD-10-CM | POA: Diagnosis not present

## 2022-12-02 DIAGNOSIS — G4730 Sleep apnea, unspecified: Secondary | ICD-10-CM | POA: Diagnosis not present

## 2022-12-02 DIAGNOSIS — K219 Gastro-esophageal reflux disease without esophagitis: Secondary | ICD-10-CM | POA: Insufficient documentation

## 2022-12-02 DIAGNOSIS — J449 Chronic obstructive pulmonary disease, unspecified: Secondary | ICD-10-CM | POA: Diagnosis not present

## 2022-12-02 DIAGNOSIS — Z9889 Other specified postprocedural states: Secondary | ICD-10-CM | POA: Diagnosis not present

## 2022-12-02 DIAGNOSIS — D242 Benign neoplasm of left breast: Secondary | ICD-10-CM | POA: Diagnosis not present

## 2022-12-02 DIAGNOSIS — E1122 Type 2 diabetes mellitus with diabetic chronic kidney disease: Secondary | ICD-10-CM | POA: Diagnosis not present

## 2022-12-02 DIAGNOSIS — I110 Hypertensive heart disease with heart failure: Secondary | ICD-10-CM | POA: Diagnosis not present

## 2022-12-02 DIAGNOSIS — I130 Hypertensive heart and chronic kidney disease with heart failure and stage 1 through stage 4 chronic kidney disease, or unspecified chronic kidney disease: Secondary | ICD-10-CM | POA: Insufficient documentation

## 2022-12-02 HISTORY — PX: BREAST LUMPECTOMY WITH RADIOACTIVE SEED LOCALIZATION: SHX6424

## 2022-12-02 LAB — COMPREHENSIVE METABOLIC PANEL
ALT: 13 U/L (ref 0–44)
AST: 15 U/L (ref 15–41)
Albumin: 3.6 g/dL (ref 3.5–5.0)
Alkaline Phosphatase: 86 U/L (ref 38–126)
Anion gap: 9 (ref 5–15)
BUN: 32 mg/dL — ABNORMAL HIGH (ref 8–23)
CO2: 23 mmol/L (ref 22–32)
Calcium: 8.5 mg/dL — ABNORMAL LOW (ref 8.9–10.3)
Chloride: 106 mmol/L (ref 98–111)
Creatinine, Ser: 2.12 mg/dL — ABNORMAL HIGH (ref 0.44–1.00)
GFR, Estimated: 26 mL/min — ABNORMAL LOW (ref >60)
Glucose, Bld: 136 mg/dL — ABNORMAL HIGH (ref 70–99)
Potassium: 3.5 mmol/L (ref 3.5–5.1)
Sodium: 138 mmol/L (ref 135–145)
Total Bilirubin: 0.7 mg/dL (ref 0.3–1.2)
Total Protein: 6.7 g/dL (ref 6.5–8.1)

## 2022-12-02 LAB — CBC
HCT: 33.9 % — ABNORMAL LOW (ref 36.0–46.0)
Hemoglobin: 10.5 g/dL — ABNORMAL LOW (ref 12.0–15.0)
MCH: 29.1 pg (ref 26.0–34.0)
MCHC: 31 g/dL (ref 30.0–36.0)
MCV: 93.9 fL (ref 80.0–100.0)
Platelets: 113 10*3/uL — ABNORMAL LOW (ref 150–400)
RBC: 3.61 MIL/uL — ABNORMAL LOW (ref 3.87–5.11)
RDW: 13.3 % (ref 11.5–15.5)
WBC: 3.5 10*3/uL — ABNORMAL LOW (ref 4.0–10.5)
nRBC: 0 % (ref 0.0–0.2)

## 2022-12-02 LAB — SURGICAL PCR SCREEN
MRSA, PCR: NEGATIVE
Staphylococcus aureus: POSITIVE — AB

## 2022-12-02 LAB — GLUCOSE, CAPILLARY
Glucose-Capillary: 122 mg/dL — ABNORMAL HIGH (ref 70–99)
Glucose-Capillary: 107 mg/dL — ABNORMAL HIGH (ref 70–99)

## 2022-12-02 SURGERY — BREAST LUMPECTOMY WITH RADIOACTIVE SEED LOCALIZATION
Anesthesia: General | Site: Breast | Laterality: Left

## 2022-12-02 MED ORDER — ROCURONIUM BROMIDE 10 MG/ML (PF) SYRINGE
PREFILLED_SYRINGE | INTRAVENOUS | Status: DC | PRN
  Administered 2022-12-02: 20 mg via INTRAVENOUS

## 2022-12-02 MED ORDER — ORAL CARE MOUTH RINSE: 15.0000 mL | Freq: Once | OROMUCOSAL | Status: AC

## 2022-12-02 MED ORDER — ACETAMINOPHEN 10 MG/ML IV SOLN: 1000.0000 mg | Freq: Once | INTRAVENOUS | Status: DC | PRN

## 2022-12-02 MED ORDER — AMISULPRIDE (ANTIEMETIC) 5 MG/2ML IV SOLN: 10.0000 mg | Freq: Once | INTRAVENOUS | Status: DC | PRN

## 2022-12-02 MED ORDER — OXYCODONE HCL 5 MG/5ML PO SOLN: 5.0000 mg | Freq: Once | ORAL | Status: DC | PRN

## 2022-12-02 MED ORDER — CHLORHEXIDINE GLUCONATE 0.12 % MT SOLN
15.0000 mL | Freq: Once | OROMUCOSAL | Status: AC
  Administered 2022-12-02: 15 mL via OROMUCOSAL
  Filled 2022-12-02: qty 15

## 2022-12-02 MED ORDER — SCOPOLAMINE 1 MG/3DAYS TD PT72: 1.0000 | MEDICATED_PATCH | TRANSDERMAL | Status: DC

## 2022-12-02 MED ORDER — PROPOFOL 10 MG/ML IV BOLUS
INTRAVENOUS | Status: DC | PRN
  Administered 2022-12-02: 130 mg via INTRAVENOUS

## 2022-12-02 MED ORDER — ACETAMINOPHEN 500 MG PO TABS
1000.0000 mg | ORAL_TABLET | ORAL | Status: AC
  Administered 2022-12-02: 1000 mg via ORAL
  Filled 2022-12-02: qty 2

## 2022-12-02 MED ORDER — FENTANYL CITRATE (PF) 250 MCG/5ML IJ SOLN
INTRAMUSCULAR | Status: DC | PRN
  Administered 2022-12-02 (×2): 50 ug via INTRAVENOUS

## 2022-12-02 MED ORDER — ACETAMINOPHEN 325 MG PO TABS: 325.0000 mg | ORAL_TABLET | ORAL | Status: DC | PRN

## 2022-12-02 MED ORDER — LIDOCAINE 2% (20 MG/ML) 5 ML SYRINGE
INTRAMUSCULAR | Status: DC | PRN
  Administered 2022-12-02: 40 mg via INTRAVENOUS

## 2022-12-02 MED ORDER — CEFAZOLIN SODIUM-DEXTROSE 2-4 GM/100ML-% IV SOLN
2.0000 g | INTRAVENOUS | Status: AC
  Administered 2022-12-02: 2 g via INTRAVENOUS
  Filled 2022-12-02: qty 100

## 2022-12-02 MED ORDER — KETAMINE HCL 50 MG/5ML IJ SOSY
PREFILLED_SYRINGE | INTRAMUSCULAR | Status: AC
  Filled 2022-12-02: qty 5

## 2022-12-02 MED ORDER — LACTATED RINGERS IV SOLN: INTRAVENOUS | Status: DC

## 2022-12-02 MED ORDER — PROMETHAZINE HCL 25 MG/ML IJ SOLN: 6.2500 mg | INTRAMUSCULAR | Status: DC | PRN

## 2022-12-02 MED ORDER — ACETAMINOPHEN 500 MG PO TABS: 1000.0000 mg | ORAL_TABLET | Freq: Once | ORAL | Status: DC

## 2022-12-02 MED ORDER — MIDAZOLAM HCL 2 MG/2ML IJ SOLN
INTRAMUSCULAR | Status: AC
  Filled 2022-12-02: qty 2

## 2022-12-02 MED ORDER — BUPIVACAINE HCL 0.5 % IJ SOLN
INTRAMUSCULAR | Status: DC | PRN
  Administered 2022-12-02: 20 mL

## 2022-12-02 MED ORDER — OXYCODONE HCL 5 MG PO TABS
5.0000 mg | ORAL_TABLET | Freq: Four times a day (QID) | ORAL | 0 refills | Status: DC | PRN
Start: 2022-12-02 — End: 2023-08-08

## 2022-12-02 MED ORDER — OXYCODONE HCL 5 MG PO TABS: 5.0000 mg | ORAL_TABLET | Freq: Once | ORAL | Status: DC | PRN

## 2022-12-02 MED ORDER — BUPIVACAINE HCL (PF) 0.5 % IJ SOLN
INTRAMUSCULAR | Status: AC
  Filled 2022-12-02: qty 30

## 2022-12-02 MED ORDER — CHLORHEXIDINE GLUCONATE CLOTH 2 % EX PADS: 6.0000 | MEDICATED_PAD | Freq: Once | CUTANEOUS | Status: DC

## 2022-12-02 MED ORDER — ACETAMINOPHEN 160 MG/5ML PO SOLN: 325.0000 mg | ORAL | Status: DC | PRN

## 2022-12-02 MED ORDER — MIDAZOLAM HCL 2 MG/2ML IJ SOLN
INTRAMUSCULAR | Status: DC | PRN
  Administered 2022-12-02: 1 mg via INTRAVENOUS

## 2022-12-02 MED ORDER — SUCCINYLCHOLINE CHLORIDE 200 MG/10ML IV SOSY
PREFILLED_SYRINGE | INTRAVENOUS | Status: DC | PRN
  Administered 2022-12-02: 100 mg via INTRAVENOUS

## 2022-12-02 MED ORDER — ONDANSETRON HCL 4 MG/2ML IJ SOLN
INTRAMUSCULAR | Status: DC | PRN
  Administered 2022-12-02: 4 mg via INTRAVENOUS

## 2022-12-02 MED ORDER — SUGAMMADEX SODIUM 200 MG/2ML IV SOLN
INTRAVENOUS | Status: DC | PRN
  Administered 2022-12-02: 200 mg via INTRAVENOUS

## 2022-12-02 MED ORDER — FENTANYL CITRATE (PF) 100 MCG/2ML IJ SOLN: 25.0000 ug | INTRAMUSCULAR | Status: DC | PRN

## 2022-12-02 MED ORDER — 0.9 % SODIUM CHLORIDE (POUR BTL) OPTIME
TOPICAL | Status: DC | PRN
  Administered 2022-12-02: 1000 mL

## 2022-12-02 MED ORDER — FENTANYL CITRATE (PF) 250 MCG/5ML IJ SOLN
INTRAMUSCULAR | Status: AC
  Filled 2022-12-02: qty 5

## 2022-12-02 MED ORDER — DEXAMETHASONE SODIUM PHOSPHATE 10 MG/ML IJ SOLN
INTRAMUSCULAR | Status: DC | PRN
  Administered 2022-12-02: 5 mg via INTRAVENOUS

## 2022-12-02 SURGICAL SUPPLY — 42 items
ADH SKN CLS APL DERMABOND .7 (GAUZE/BANDAGES/DRESSINGS) ×1
ADH SKN CLS LQ APL DERMABOND (GAUZE/BANDAGES/DRESSINGS) ×1
APL PRP STRL LF DISP 70% ISPRP (MISCELLANEOUS) ×1
APPLIER CLIP 9.375 MED OPEN (MISCELLANEOUS)
APR CLP MED 9.3 20 MLT OPN (MISCELLANEOUS)
BAG COUNTER SPONGE SURGICOUNT (BAG) ×1 IMPLANT
BAG SPNG CNTER NS LX DISP (BAG) ×1
BINDER BREAST LRG (GAUZE/BANDAGES/DRESSINGS) IMPLANT
BINDER BREAST XLRG (GAUZE/BANDAGES/DRESSINGS) IMPLANT
CANISTER SUCT 3000ML PPV (MISCELLANEOUS) IMPLANT
CHLORAPREP W/TINT 26 (MISCELLANEOUS) ×1 IMPLANT
CLIP APPLIE 9.375 MED OPEN (MISCELLANEOUS) IMPLANT
COVER PROBE W GEL 5X96 (DRAPES) ×1 IMPLANT
COVER SURGICAL LIGHT HANDLE (MISCELLANEOUS) ×1 IMPLANT
DERMABOND ADVANCED .7 DNX12 (GAUZE/BANDAGES/DRESSINGS) ×1 IMPLANT
DERMABOND ADVANCED .7 DNX6 (GAUZE/BANDAGES/DRESSINGS) IMPLANT
DEVICE DUBIN SPECIMEN MAMMOGRA (MISCELLANEOUS) ×1 IMPLANT
DRAPE CHEST BREAST 15X10 FENES (DRAPES) ×1 IMPLANT
ELECT CAUTERY BLADE 6.4 (BLADE) ×1 IMPLANT
ELECT REM PT RETURN 9FT ADLT (ELECTROSURGICAL) ×1
ELECTRODE REM PT RTRN 9FT ADLT (ELECTROSURGICAL) ×1 IMPLANT
GAUZE PAD ABD 8X10 STRL (GAUZE/BANDAGES/DRESSINGS) ×1 IMPLANT
GLOVE BIO SURGEON STRL SZ8 (GLOVE) ×1 IMPLANT
GLOVE BIOGEL PI IND STRL 8 (GLOVE) ×1 IMPLANT
GOWN STRL REUS W/ TWL LRG LVL3 (GOWN DISPOSABLE) ×1 IMPLANT
GOWN STRL REUS W/ TWL XL LVL3 (GOWN DISPOSABLE) ×1 IMPLANT
GOWN STRL REUS W/TWL LRG LVL3 (GOWN DISPOSABLE) ×1
GOWN STRL REUS W/TWL XL LVL3 (GOWN DISPOSABLE) ×1
KIT BASIN OR (CUSTOM PROCEDURE TRAY) ×1 IMPLANT
KIT MARKER MARGIN INK (KITS) ×1 IMPLANT
LIGHT WAVEGUIDE WIDE FLAT (MISCELLANEOUS) IMPLANT
NDL HYPO 25GX1X1/2 BEV (NEEDLE) ×1 IMPLANT
NEEDLE HYPO 25GX1X1/2 BEV (NEEDLE) ×1 IMPLANT
NS IRRIG 1000ML POUR BTL (IV SOLUTION) IMPLANT
PACK GENERAL/GYN (CUSTOM PROCEDURE TRAY) ×1 IMPLANT
SUT MNCRL AB 4-0 PS2 18 (SUTURE) ×1 IMPLANT
SUT SILK 2 0 SH (SUTURE) IMPLANT
SUT VIC AB 2-0 SH 27 (SUTURE)
SUT VIC AB 2-0 SH 27XBRD (SUTURE) IMPLANT
SUT VIC AB 3-0 SH 8-18 (SUTURE) ×1 IMPLANT
SYR CONTROL 10ML LL (SYRINGE) ×1 IMPLANT
TOWEL GREEN STERILE FF (TOWEL DISPOSABLE) IMPLANT

## 2022-12-02 NOTE — Progress Notes (Signed)
Notified Dr. Hart [place] that pt took Ozempic on Tuesday May 14. She takes it weekly. She last took her Jardiance on May 15. She last took her Coreg 25mg  on Wednesday evening5/15- BP 142/85,p73. No orders at this time.

## 2022-12-02 NOTE — Anesthesia Postprocedure Evaluation (Signed)
Anesthesia Post Note  Patient: [name] [name]  Procedure(s) Performed: LEFT BREAST LUMPECTOMY WITH RADIOACTIVE SEED LOCALIZATION (Left: Breast)     Patient location during evaluation: PACU Anesthesia Type: General Level of consciousness: awake and alert Pain management: pain level controlled Vital Signs Assessment: post-procedure vital signs reviewed and stable Respiratory status: spontaneous breathing, nonlabored ventilation, respiratory function stable and patient connected to nasal cannula oxygen Cardiovascular status: blood pressure returned to baseline and stable Postop Assessment: no apparent nausea or vomiting Anesthetic complications: no  No notable events documented.  Last Vitals:  Vitals:   12/02/22 0915 12/02/22 0930  BP: (!) 159/90 135/87  Pulse: 78 75  Resp: 15 11  Temp:  36.6 C  SpO2: 94% 94%    Last Pain:  Vitals:   12/02/22 0930  TempSrc:   PainSc: 0-No pain                 Shelton Silvas

## 2022-12-02 NOTE — Op Note (Signed)
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 Bernarda Caffey ND   I was personally present during the key and critical portions of this procedure and immediately available throughout the entire procedure, as documented in my operative note.    Anesthesia: General with 0.25% Marcaine plain  EBL: Minimal  Specimen: Left breast tissue with seed and clip verified by intraoperative imaging  Drains: None  Indications for procedure: The patient is a 62 year old female with a left breast papilloma.  We discussed the pros and cons of removal given the size and operative and nonoperative management strategies discussed, the patient opted for removal.The procedure has been discussed with the patient. Alternatives to surgery have been discussed with the patient.  Risks of surgery include bleeding,  Infection,  Seroma formation, death,  and the need for further surgery.   The patient understands and wishes to proceed.    Description of procedure: The patient was met in the holding area and questions were answered.  Of note proceed was placed as an outpatient.  Left breast was marked as correct site.  She was taken back to the operating room.  She was placed supine upon the operating table.  After induction of general anesthesia, left breast was prepped and draped in sterile fashion and timeout performed.  Proper patient, site and procedure verified.  Neoprobe was used to identify the seed left breast upper outer quadrant.  A curvilinear incision was made along the nipple areolar complex.  Dissection was carried down all tissue and the seed and clip were excised with a grossly negative margin.  Hemostasis achieved with cautery.  Local anesthetic infiltrated.  The specimen was oriented with ink and the imaging revealed the seed and clip to be in the specimen.  There  is sent to pathology.  Deep tissue planes were approximated with 3-0 Vicryl.  4 Monocryl was used to close the skin in a subcuticular fashion.  Dermabond applied.  All counts found to be correct.  Patient was awoke extubated taken to recovery in satisfactory condition.

## 2022-12-02 NOTE — H&P (Signed)
History of Present Illness: [name] [name] is a 62 y.o. female who is seen today as an office consultation for evaluation of Breast Mass Patient sent for evaluation of abnormal mammogram. Patient underwent screening and subsequent diagnostic mammography November 2023. This is for right breast mass and a mammographic abnormality left breast upper outer quadrant which is felt to be a lymph node. Core biopsy in the right showed fat necrosis and that the left showed a papilloma. She is here today to discuss treatment options or follow-up options. She has no history of breast pain or nipple discharge. There is been no history of breast mass.   Review of Systems: A complete review of systems was obtained from the patient. I have reviewed this information and discussed as appropriate with the patient. See HPI as well for other ROS.    Medical History: Past Medical History:  Diagnosis Date  Chronic kidney disease  Diabetes mellitus without complication (CMS-HCC)   There is no problem list on file for this patient.  History reviewed. No pertinent surgical history.   Allergies  Allergen Reactions  Lisinopril Swelling   Current Outpatient Medications on File Prior to Visit  Medication Sig Dispense Refill  amLODIPine (NORVASC) 2.5 MG tablet Take 2.5 mg by mouth once daily  atorvastatin (LIPITOR) 40 MG tablet Take 40 mg by mouth once daily  carvediloL (COREG) 25 MG tablet Take by mouth  famotidine (PEPCID) 40 MG tablet Take 40 mg by mouth once daily  ferrous sulfate 325 (65 FE) MG tablet Take 325 mg by mouth daily with breakfast  hydrALAZINE (APRESOLINE) 50 MG tablet Take 50 mg by mouth 3 (three) times daily  JARDIANCE 10 mg tablet Take 10 mg by mouth once daily  methIMAzole (TAPAZOLE) 5 MG tablet Take 5 mg by mouth 3 (three) times daily  mometasone-formoterol (DULERA) 100-5 mcg/actuation inhaler Inhale into the lungs  nystatin (MYCOSTATIN) 100,000 unit/gram ointment APPLY ONE APPLICATION  TOPICALLY TWICE DAILY AS NEEDED (RASH/SKIN IRRITATION)  oxyBUTYnin (DITROPAN) 5 mg tablet Take by mouth  OZEMPIC 1 mg/dose (4 mg/3 mL) pen injector INJECT 1 MG UNDER THE SKIN EVERY WEEK AS DIRECTED  pregabalin (LYRICA) 75 MG capsule Take by mouth  spironolactone (ALDACTONE) 25 MG tablet Take 12.5 mg by mouth once daily  TORsemide (DEMADEX) 20 MG tablet Take by mouth  VENTOLIN HFA 90 mcg/actuation inhaler INHALE 2 PUFFS INTO THE LUNGS EVERY 4 (FOUR) HOURS AS NEEDED FOR WHEEZING OR SHORTNESS OF BREATH.   No current facility-administered medications on file prior to visit.   Family History  Problem Relation Age of Onset  Colon cancer Mother  High blood pressure (Hypertension) Sister  Deep vein thrombosis (DVT or abnormal blood clot formation) Sister  Deep vein thrombosis (DVT or abnormal blood clot formation) Brother    Social History   Tobacco Use  Smoking Status Former  Types: Cigarettes  Smokeless Tobacco Never    Social History   Socioeconomic History  Marital status: Legally Separated  Tobacco Use  Smoking status: Former  Types: Cigarettes  Smokeless tobacco: Never   Objective:   Vitals:  09/27/22 1112  BP: 120/72  Pulse: 76  Weight: 90.7 kg (200 lb)  Height: 162.6 cm (5\' 4" )   Body mass index is 34.33 kg/m.  Physical Exam Exam conducted with a chaperone present.  HENT:  Head: Normocephalic.  Cardiovascular:  Rate and Rhythm: Normal rate.  Pulmonary:  Effort: Pulmonary effort is normal.  Chest:  Breasts: Right: Normal. No inverted nipple, mass or nipple discharge.  Left: Normal. No inverted nipple, mass or nipple discharge.  Comments: Large pendulous  Musculoskeletal:  General: Normal range of motion.  Lymphadenopathy:  Upper Body:  Right upper body: No supraclavicular or axillary adenopathy.  Left upper body: No supraclavicular or axillary adenopathy.  Skin: General: Skin is warm and dry.  Neurological:  General: No focal deficit present.   Mental Status: She is alert and oriented to person, place, and time.  Psychiatric:  Mood and Affect: Mood normal.  Behavior: Behavior normal.     Labs, Imaging and Diagnostic Testing:  Diagnosis 1. Breast, right, needle core biopsy, 12 o'clock, ribbon clip FAT NECROSIS, WITH ASSOCIATED FIBROSIS. NO MALIGNANCY IDENTIFIED. NO MICROCALCIFICATIONS IDENTIFIED. 2. Breast, left, needle core biopsy, 2 o'clock, ribbon clip INTRADUCTAL PAPILLOMA WITH STROMAL SCLEROSIS. NO MALIGNANCY IDENTIFIED. NO MICROCALCIFICATIONS IDENTIFIED. Diagnosis Note 2. [facility] imaging was telephoned on 06/09/2022 by Dr. Unknown Foley. Napoleon Form MD Pathologist, Electronic Signature (Case signed 06/09/2022) Specimen Gross and Clinical Information Specimen Comment 1. TIF: 10:15 CIT: < 5 minutes, mass 2. TIF: 10:35 am Specimen(s) Obtained: 1. Breast, right, needle core biopsy, 12 o'clock, ribbon clip 2. Breast, left, needle core biopsy, 2 o'clock, ribbon clip  CLINICAL DATA: Palpable lump in the right breast.  EXAM: DIGITAL DIAGNOSTIC BILATERAL MAMMOGRAM WITH TOMOSYNTHESIS; ULTRASOUND RIGHT BREAST LIMITED; ULTRASOUND LEFT BREAST LIMITED  TECHNIQUE: Bilateral digital diagnostic mammography and breast tomosynthesis was performed.; Targeted ultrasound examination of the right breast was performed; Targeted ultrasound examination of the left breast was performed.  COMPARISON: Previous exam(s).  ACR Breast Density Category b: There are scattered areas of fibroglandular density.  FINDINGS: No suspicious masses, calcifications, or distortion identified on the left breast. There is a mass in the left periareolar region located at 2 o'clock. No other suspicious findings in either breast.  Targeted ultrasound is performed, showing a small irregular mass in the right breast at 12 o'clock, 10 cm from the nipple correlating with the patient's palpable lump. This small mass measures 2 x  3 x 2 mm. It is unclear whether this is a small irregular cyst or small solid mass. No axillary adenopathy.  There is a complex cystic and solid mass in the left breast at 2 o'clock, 2 cm from the nipple. The cystic component measures 2.2 x 1.4 by 2.1 cm. The solid component measures 0.9 x 0.5 x 1.2 cm. This mass may be associated with a duct.  There is a single mildly abnormal node in the left axilla with focal cortical prominence measuring up to 3.4 mm.  IMPRESSION: 1. Newly palpable mass in the right breast correlates with a 3 mm irregular mass on ultrasound. 2. There is a complex cystic mass. The solid component measures up to 12 mm. 3. There is a mildly abnormal left axillary lymph node with focal cortical prominence measuring up to 3.4 mm. 4. No other abnormalities.  RECOMMENDATION: 1. Recommend ultrasound-guided biopsy of the newly palpable mass in the right breast at 12 o'clock, 10 cm from the nipple. 2. Recommend ultrasound-guided biopsy of the solid component of the left breast mass at 2 o'clock. 3. Recommend ultrasound-guided biopsy of the mildly abnormal left axillary lymph node.  I have discussed the findings and recommendations with the patient. If applicable, a reminder letter will be sent to the patient regarding the next appointment.  BI-RADS CATEGORY 4: Suspicious.   Electronically Signed By: Gerome Sam III M.D. On: 06/02/2022 11:35  Assessment and Plan:   Diagnoses and all orders for this visit:  Papilloma of left breast   Reviewed papilloma pathophysiology. Reviewed literature. Discussed potential for observation. Discussed left breast lumpectomy as well. Discussed potential upgrade risk and cancer risk which are quite low with these lesions. Despite discussion the above, the patient has opted for left breast lumpectomy. Risks and benefits of surgery reviewed as well as potential complications. Discussed the placement of the seed. She has agreed  to proceed.The procedure has been discussed with the patient. Alternatives to surgery have been discussed with the patient. Risks of surgery include bleeding, Infection, Seroma formation, death, and the need for further surgery. The patient understands and wishes to proceed.    Hayden Rasmussen, MD

## 2022-12-02 NOTE — Anesthesia Preprocedure Evaluation (Addendum)
Anesthesia Evaluation  Patient identified by MRN, date of birth, ID band Patient awake    Reviewed: Allergy & Precautions, NPO status , Patient's Chart, lab work & pertinent test results  Airway Mallampati: II  TM Distance: >3 FB Neck ROM: Full    Dental  (+) Teeth Intact, Dental Advisory Given   Pulmonary sleep apnea , COPD   breath sounds clear to auscultation       Cardiovascular hypertension, Pt. on medications and Pt. on home beta blockers +CHF   Rhythm:Regular Rate:Normal     Neuro/Psych negative neurological ROS  negative psych ROS   GI/Hepatic ,GERD  Medicated,,(+) Hepatitis -, C  Endo/Other  diabetes, Type 2, Oral Hypoglycemic Agents    Renal/GU Renal disease     Musculoskeletal negative musculoskeletal ROS (+)    Abdominal   Peds  Hematology   Anesthesia Other Findings   Reproductive/Obstetrics                             Anesthesia Physical Anesthesia Plan  ASA: 2  Anesthesia Plan: General   Post-op Pain Management: Tylenol PO (pre-op)* and Toradol IV (intra-op)*   Induction: Intravenous  PONV Risk Score and Plan: 4 or greater and Ondansetron, Dexamethasone, Scopolamine patch - Pre-op and Midazolam  Airway Management Planned: Oral ETT  Additional Equipment: None  Intra-op Plan:   Post-operative Plan: Extubation in OR  Informed Consent: I have reviewed the patients History and Physical, chart, labs and discussed the procedure including the risks, benefits and alternatives for the proposed anesthesia with the patient or authorized representative who has indicated his/her understanding and acceptance.     Dental advisory given  Plan Discussed with: CRNA  Anesthesia Plan Comments:        Anesthesia Quick Evaluation

## 2022-12-02 NOTE — Interval H&P Note (Signed)
History and Physical Interval Note:  12/02/2022 7:17 AM  [name] [name]  has presented today for surgery, with the diagnosis of LEFT BREAST PAPILLOMA.  The various methods of treatment have been discussed with the patient and family. After consideration of risks, benefits and other options for treatment, the patient has consented to  Procedure(s): LEFT BREAST LUMPECTOMY WITH RADIOACTIVE SEED LOCALIZATION (Left) as a surgical intervention.  The patient's history has been reviewed, patient examined, no change in status, stable for surgery.  I have reviewed the patient's chart and labs.  Questions were answered to the patient's satisfaction.     Kindell Strada A Renso Swett

## 2022-12-02 NOTE — Anesthesia Procedure Notes (Signed)
Procedure Name: Intubation Date/Time: 12/02/2022 7:45 AM  Performed by: Loleta Rodina Pinales, CRNAPre-anesthesia Checklist: Patient identified, Patient being monitored, Timeout performed, Emergency Drugs available and Suction available Patient Re-evaluated:Patient Re-evaluated prior to induction Oxygen Delivery Method: Circle system utilized Preoxygenation: Pre-oxygenation with 100% oxygen Induction Type: IV induction, Rapid sequence and Cricoid Pressure applied Laryngoscope Size: Mac and 4 Grade View: Grade II Tube type: Oral Tube size: 7.0 mm Number of attempts: 1 Airway Equipment and Method: Stylet Placement Confirmation: ETT inserted through vocal cords under direct vision, positive ETCO2 and breath sounds checked- equal and bilateral Secured at: 22 cm Tube secured with: Tape Dental Injury: Teeth and Oropharynx as per pre-operative assessment

## 2022-12-02 NOTE — Transfer of Care (Signed)
Immediate Anesthesia Transfer of Care Note  Patient: [name] [name]  Procedure(s) Performed: LEFT BREAST LUMPECTOMY WITH RADIOACTIVE SEED LOCALIZATION (Left: Breast)  Patient Location: PACU  Anesthesia Type:General  Level of Consciousness: awake  Airway & Oxygen Therapy: Patient Spontanous Breathing  Post-op Assessment: Report given to RN and Post -op Vital signs reviewed and stable  Post vital signs: Reviewed and stable  Last Vitals:  Vitals Value Taken Time  BP 148/84 12/02/22 0845  Temp    Pulse 81 12/02/22 0848  Resp 14 12/02/22 0848  SpO2 94 % 12/02/22 0848  Vitals shown include unvalidated device data.  Last Pain:  Vitals:   12/02/22 0615  TempSrc:   PainSc: 0-No pain         Complications: No notable events documented.

## 2022-12-02 NOTE — Discharge Instructions (Signed)
[facility] Office Phone Number [phone]  BREAST BIOPSY/ PARTIAL MASTECTOMY: POST OP INSTRUCTIONS  Always review your discharge instruction sheet given to you by the facility where your surgery was performed.  IF YOU HAVE DISABILITY OR FAMILY LEAVE FORMS, YOU MUST BRING THEM TO THE OFFICE FOR PROCESSING.  DO NOT GIVE THEM TO YOUR DOCTOR.  A prescription for pain medication may be given to you upon discharge.  Take your pain medication as prescribed, if needed.  If narcotic pain medicine is not needed, then you may take acetaminophen (Tylenol) or ibuprofen (Advil) as needed. Take your usually prescribed medications unless otherwise directed If you need a refill on your pain medication, please contact your pharmacy.  They will contact our office to request authorization.  Prescriptions will not be filled after 5pm or on week-ends. You should eat very light the first 24 hours after surgery, such as soup, crackers, pudding, etc.  Resume your normal diet the day after surgery. Most patients will experience some swelling and bruising in the breast.  Ice packs and a good support bra will help.  Swelling and bruising can take several days to resolve.  It is common to experience some constipation if taking pain medication after surgery.  Increasing fluid intake and taking a stool softener will usually help or prevent this problem from occurring.  A mild laxative (Milk of Magnesia or Miralax) should be taken according to package directions if there are no bowel movements after 48 hours. Unless discharge instructions indicate otherwise, you may remove your bandages 24-48 hours after surgery, and you may shower at that time.  You may have steri-strips (small skin tapes) in place directly over the incision.  These strips should be left on the skin for 7-10 days.  If your surgeon used skin glue on the incision, you may shower in 24 hours.  The glue will flake off over the next 2-3 weeks.  Any  sutures or staples will be removed at the office during your follow-up visit. ACTIVITIES:  You may resume regular daily activities (gradually increasing) beginning the next day.  Wearing a good support bra or sports bra minimizes pain and swelling.  You may have sexual intercourse when it is comfortable. You may drive when you no longer are taking prescription pain medication, you can comfortably wear a seatbelt, and you can safely maneuver your car and apply brakes. RETURN TO WORK:  ______________________________________________________________________________________ You should see your doctor in the office for a follow-up appointment approximately two weeks after your surgery.  Your doctor's nurse will typically make your follow-up appointment when she calls you with your pathology report.  Expect your pathology report 2-3 business days after your surgery.  You may call to check if you do not hear from us after three days. OTHER INSTRUCTIONS: _______________________________________________________________________________________________ _____________________________________________________________________________________________________________________________________ _____________________________________________________________________________________________________________________________________ _____________________________________________________________________________________________________________________________________  WHEN TO CALL YOUR DOCTOR: Fever over 101.0 Nausea and/or vomiting. Extreme swelling or bruising. Continued bleeding from incision. Increased pain, redness, or drainage from the incision.  The clinic staff is available to answer your questions during regular business hours.  Please don't hesitate to call and ask to speak to one of the nurses for clinical concerns.  If you have a medical emergency, go to the nearest emergency room or call 911.  A surgeon from [facility] is always on call at the hospital.  For further questions, please visit [email or web address]   

## 2022-12-03 ENCOUNTER — Encounter (HOSPITAL_COMMUNITY): Payer: Self-pay | Admitting: Surgery

## 2022-12-06 ENCOUNTER — Ambulatory Visit: Payer: 59 | Attending: Family Medicine | Admitting: Family Medicine

## 2022-12-06 ENCOUNTER — Encounter: Payer: Self-pay | Admitting: Surgery

## 2022-12-06 ENCOUNTER — Encounter: Payer: Self-pay | Admitting: Family Medicine

## 2022-12-06 VITALS — BP 107/67 | HR 81 | Temp 98.2°F | Ht 64.0 in | Wt 201.4 lb

## 2022-12-06 DIAGNOSIS — I5032 Chronic diastolic (congestive) heart failure: Secondary | ICD-10-CM | POA: Diagnosis not present

## 2022-12-06 DIAGNOSIS — E1149 Type 2 diabetes mellitus with other diabetic neurological complication: Secondary | ICD-10-CM | POA: Diagnosis not present

## 2022-12-06 DIAGNOSIS — E1122 Type 2 diabetes mellitus with diabetic chronic kidney disease: Secondary | ICD-10-CM

## 2022-12-06 DIAGNOSIS — I129 Hypertensive chronic kidney disease with stage 1 through stage 4 chronic kidney disease, or unspecified chronic kidney disease: Secondary | ICD-10-CM

## 2022-12-06 DIAGNOSIS — I110 Hypertensive heart disease with heart failure: Secondary | ICD-10-CM

## 2022-12-06 DIAGNOSIS — Z794 Long term (current) use of insulin: Secondary | ICD-10-CM | POA: Diagnosis not present

## 2022-12-06 DIAGNOSIS — K5900 Constipation, unspecified: Secondary | ICD-10-CM

## 2022-12-06 DIAGNOSIS — E0500 Thyrotoxicosis with diffuse goiter without thyrotoxic crisis or storm: Secondary | ICD-10-CM

## 2022-12-06 LAB — SURGICAL PATHOLOGY

## 2022-12-06 LAB — POCT GLYCOSYLATED HEMOGLOBIN (HGB A1C): HbA1c, POC (controlled diabetic range): 6.2 % (ref 0.0–7.0)

## 2022-12-06 MED ORDER — POLYETHYLENE GLYCOL 3350 17 GM/SCOOP PO POWD
17.0000 g | Freq: Every day | ORAL | 1 refills | Status: DC
Start: 2022-12-06 — End: 2023-04-05

## 2022-12-06 NOTE — Progress Notes (Signed)
Constipation 

## 2022-12-06 NOTE — Progress Notes (Signed)
Subjective:  Patient ID: [name] [name], female    DOB: [birth year]  Age: 62 y.o. MRN: [id number]  CC: Diabetes   HPI [name] [name] is a 62 y.o. year old female with a history of  type 2 diabetes mellitus (A1c 7.5), diabetic neuropathy, hypertension, diabetic retinopathy, hyperlipidemia, hepatitis C (completed treatment with harvoni), OSA, diastolic CHF (EF 60 to 65% from echo of 09/2021), stage III CKD, hyperthyroidism.  Daughter was on the phone during the visit.  Interval History:  She underwent left breast lumpectomy with radioactive seed localization on 12/02/22 due to presence of left breast intraductal papilloma.  She is unsure of follow-up with her general surgeon. States she is still interested in undergoing bilateral breast reduction.  Cardiology visit occurred last month.  With no changes in her regimen.  She denies presence of chest pain or dyspnea.  For her hypothyroidism she is off methimazole.  Currently under the care of endocrinology with [facility] with last visit in 09/2022.  She was initially seen by [facility] in 05/2022 and notes reviewed indicates she was told to be off her methimazole for 5 days prior to thyroid uptake scan.  She never underwent a thyroid uptake scan.  Today she complains of constipation and has not used the bathroom in 1 week. She had chronic diarrhea and has taken Imodium which stopped her diarrhea and now resulted in constipation. She is doing well on her diabetes regimen.  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   Qualifier: Diagnosis of  By: Huntley Dec, Scott     Hypertension    MRSA (methicillin resistant Staphylococcus aureus)    Trichomonas    VAGINITIS, BACTERIAL, RECURRENT 08/15/2007   Qualifier: Diagnosis of  By: Barbaraann Barthel MD,  [place]      Past Surgical History:  Procedure Laterality Date   BREAST BIOPSY Left 2018   BREAST BIOPSY Left 06/08/2022   BREAST BIOPSY Right 06/08/2022   BREAST BIOPSY Right 06/08/2022   [place] RT BREAST BX W LOC DEV 1ST LESION IMG BX SPEC US GUIDE 06/08/2022 [facility]   BREAST BIOPSY Left 06/08/2022   [place] LT BREAST BX W LOC DEV 1ST LESION IMG BX SPEC US GUIDE 06/08/2022 [facility]   BREAST BIOPSY  12/01/2022   MM LT RADIOACTIVE SEED LOC MAMMO GUIDE 12/01/2022 [facility]   BREAST LUMPECTOMY WITH RADIOACTIVE SEED LOCALIZATION Left 12/02/2022   Procedure: LEFT BREAST LUMPECTOMY WITH RADIOACTIVE SEED LOCALIZATION;  Surgeon: Harriette Bouillon, MD;  Location: [facility];  Service: General;  Laterality: Left;   CESAREAN SECTION     breech    Family History  Problem Relation Age of Onset   Colon cancer Mother    Liver disease Sister    Other Neg Hx    Breast cancer Neg Hx    Esophageal cancer Neg Hx    Rectal cancer Neg Hx    Tremor Neg Hx     Social History   Socioeconomic History   Marital status: Legally Separated    Spouse name: Not on file   Number of children: 1   Years of education: Not on file   Highest education level: 9th grade  Occupational History   Occupation: disability  Tobacco Use   Smoking status: Never   Smokeless tobacco:  Never  Vaping Use   Vaping Use: Never used  Substance and Sexual Activity   Alcohol use: No   Drug use: Not Currently    Types: Cocaine, Marijuana    Comment: remote h/o cocaine 2015 and marijuana 2018   Sexual activity: Not Currently  Other Topics Concern   Not on file  Social History Narrative   Lives with friend, [name] and nephew.  One daughter.    Social Determinants of Health   Financial Resource Strain: Low Risk  (07/22/2022)   Overall Financial Resource Strain (CARDIA)    Difficulty of Paying Living Expenses: Not very hard  Food Insecurity: No Food Insecurity (11/26/2022)   Hunger Vital Sign    Worried  About Running Out of Food in the Last Year: Never true    Ran Out of Food in the Last Year: Never true  Transportation Needs: No Transportation Needs (07/02/2022)   PRAPARE - Administrator, Civil Service (Medical): No    Lack of Transportation (Non-Medical): No  Physical Activity: Insufficiently Active (10/07/2022)   Exercise Vital Sign    Days of Exercise per Week: 2 days    Minutes of Exercise per Session: 10 min  Stress: No Stress Concern Present (10/07/2022)   [employer] of Occupational Health - Occupational Stress Questionnaire    Feeling of Stress : Not at all  Social Connections: Moderately Isolated (07/02/2022)   Social Connection and Isolation Panel [NHANES]    Frequency of Communication with Friends and Family: More than three times a week    Frequency of Social Gatherings with Friends and Family: More than three times a week    Attends Religious Services: More than 4 times per year    Active Member of [employer] or Organizations: No    Attends Banker Meetings: Never    Marital Status: Divorced    Allergies  Allergen Reactions   Lisinopril Swelling    Outpatient Medications Prior to Visit  Medication Sig Dispense Refill   albuterol (VENTOLIN HFA) 108 (90 Base) MCG/ACT inhaler Inhale 2 puffs into the lungs every 4 (four) hours as needed for wheezing or shortness of breath. 1 each 0   amLODipine (NORVASC) 2.5 MG tablet Take 1 tablet (2.5 mg total) by mouth daily. 90 tablet 1   atorvastatin (LIPITOR) 40 MG tablet Take 1 tablet (40 mg total) by mouth every morning. 90 tablet 1   carboxymethylcellul-glycerin (LUBRICATING EYE DROPS) 0.5-0.9 % ophthalmic solution Place 1 drop into both eyes daily as needed (dry eyes). 10 mL 0   carvedilol (COREG) 25 MG tablet Take 1 tablet (25 mg total) by mouth 2 (two) times daily. 180 tablet 3   empagliflozin (JARDIANCE) 10 MG TABS tablet Take 1 tablet (10 mg total) by mouth daily. 90 tablet 1   famotidine (PEPCID)  40 MG tablet Take 1 tablet (40 mg total) by mouth daily. 90 tablet 1   hydrALAZINE (APRESOLINE) 25 MG tablet Take 1 tablet (25 mg total) by mouth 3 (three) times daily. 270 tablet 3   oxybutynin (DITROPAN) 5 MG tablet Take 1 tablet (5 mg total) by mouth 2 (two) times daily. 60 tablet 6   oxyCODONE (OXY IR/ROXICODONE) 5 MG immediate release tablet Take 1 tablet (5 mg total) by mouth every 6 (six) hours as needed for severe pain. 15 tablet 0   pregabalin (LYRICA) 75 MG capsule Take 1 capsule (75 mg total) by mouth 2 (two) times daily. 180 capsule 1   pregabalin (LYRICA) 75  MG capsule Take 1 capsule (75 mg total) by mouth 2 (two) times daily. 180 capsule 1   Semaglutide, 1 MG/DOSE, 4 MG/3ML SOPN Inject 1 mg as directed once a week. 3 mL 2   spironolactone (ALDACTONE) 25 MG tablet Take 0.5 tablets (12.5 mg total) by mouth daily. 30 tablet 3   torsemide (DEMADEX) 20 MG tablet Take 1 tablet (20 mg total) by mouth daily. 90 tablet 6   hydrALAZINE (APRESOLINE) 50 MG tablet Take 1 tablet (50 mg total) by mouth 3 (three) times daily. (Patient not taking: Reported on 12/06/2022) 135 tablet 8   No facility-administered medications prior to visit.     ROS Review of Systems  Constitutional:  Negative for activity change and appetite change.  HENT:  Negative for sinus pressure and sore throat.   Respiratory:  Negative for chest tightness, shortness of breath and wheezing.   Cardiovascular:  Negative for chest pain and palpitations.  Gastrointestinal:  Positive for constipation. Negative for abdominal distention and abdominal pain.  Genitourinary: Negative.   Musculoskeletal: Negative.   Psychiatric/Behavioral:  Negative for behavioral problems and dysphoric mood.     Objective:  BP 107/67   Pulse 81   Temp 98.2 F (36.8 C) (Oral)   Ht 5\' 4"  (1.626 m)   Wt 201 lb 6.4 oz (91.4 kg)   LMP 04/01/2010   SpO2 97%   BMI 34.57 kg/m      12/06/2022   10:42 AM 12/02/2022    9:30 AM 12/02/2022    9:15 AM   BP/Weight  Systolic BP 107 135 159  Diastolic BP 67 87 90  Wt. (Lbs) 201.4    BMI 34.57 kg/m2        Physical Exam Constitutional:      Appearance: She is well-developed.  Cardiovascular:     Rate and Rhythm: Normal rate.     Heart sounds: Normal heart sounds. No murmur heard. Pulmonary:     Effort: Pulmonary effort is normal.     Breath sounds: Normal breath sounds. No wheezing or rales.  Chest:     Chest wall: No tenderness.  Abdominal:     General: Bowel sounds are normal. There is no distension.     Palpations: Abdomen is soft. There is no mass.     Tenderness: There is no abdominal tenderness.  Musculoskeletal:        General: Normal range of motion.     Right lower leg: No edema.     Left lower leg: No edema.  Neurological:     Mental Status: She is alert and oriented to person, place, and time.  Psychiatric:        Mood and Affect: Mood normal.        Latest Ref Rng & Units 12/02/2022    6:13 AM 11/10/2022   10:58 AM 10/27/2022   12:14 PM  CMP  Glucose 70 - 99 mg/dL 578  469  629   BUN 8 - 23 mg/dL 32  36  30   Creatinine 0.44 - 1.00 mg/dL 5.28  4.13  2.44   Sodium 135 - 145 mmol/L 138  140  140   Potassium 3.5 - 5.1 mmol/L 3.5  3.9  4.0   Chloride 98 - 111 mmol/L 106  109  106   CO2 22 - 32 mmol/L 23  22  26    Calcium 8.9 - 10.3 mg/dL 8.5  9.2  8.9   Total Protein 6.5 - 8.1 g/dL 6.7  Total Bilirubin 0.3 - 1.2 mg/dL 0.7     Alkaline Phos 38 - 126 U/L 86     AST 15 - 41 U/L 15     ALT 0 - 44 U/L 13       Lipid Panel     Component Value Date/Time   CHOL 148 09/06/2022 1135   TRIG 77 09/06/2022 1135   HDL 78 09/06/2022 1135   CHOLHDL 2.5 01/29/2019 1118   CHOLHDL 3 06/18/2014 0951   VLDL 19.6 06/18/2014 0951   LDLCALC 55 09/06/2022 1135    CBC    Component Value Date/Time   WBC 3.5 (L) 12/02/2022 0613   RBC 3.61 (L) 12/02/2022 0613   HGB 10.5 (L) 12/02/2022 0613   HGB 9.8 (L) 07/10/2019 1544   HCT 33.9 (L) 12/02/2022 0613   HCT 30.9  (L) 07/10/2019 1544   PLT 113 (L) 12/02/2022 0613   PLT 171 07/10/2019 1544   MCV 93.9 12/02/2022 0613   MCV 86 07/10/2019 1544   MCH 29.1 12/02/2022 0613   MCHC 31.0 12/02/2022 0613   RDW 13.3 12/02/2022 0613   RDW 12.7 07/10/2019 1544   LYMPHSABS 1.0 09/22/2021 1209   LYMPHSABS 1.3 07/10/2019 1544   MONOABS 0.4 09/22/2021 1209   EOSABS 0.0 09/22/2021 1209   EOSABS 0.0 07/10/2019 1544   BASOSABS 0.0 09/22/2021 1209   BASOSABS 0.0 07/10/2019 1544    Lab Results  Component Value Date   HGBA1C 6.2 12/06/2022    Lab Results  Component Value Date   TSH <0.005 (L) 07/05/2022   TSH from care everywhere was less than 0.05 in 08/2022. Assessment & Plan:  1. Type 2 diabetes mellitus with other neurologic complication, with long-term current use of insulin (HCC) Controlled with A1c of 6.7 Continue Ozempic Counseled on Diabetic diet, my plate method, 161 minutes of moderate intensity exercise/week Blood sugar logs with fasting goals of 80-120 mg/dl, random of less than 096 and in the event of sugars less than 60 mg/dl or greater than 045 mg/dl encouraged to notify the clinic. Advised on the need for annual eye exams, annual foot exams, Pneumonia vaccine. - POCT glycosylated hemoglobin (Hb A1C)  2. Constipation, unspecified constipation type Medication induced as she uses Lomotil to treat diarrhea with resulting constipation Cautioned on judicious use of Lomotil Counseled on increasing fiber intake, fruits and vegetable, limit intake of foods like cheese, white bread, white rice - polyethylene glycol powder (GLYCOLAX/MIRALAX) 17 GM/SCOOP powder; Take 17 g by mouth daily.  Dispense: 3350 g; Refill: 1  3. 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 Discussed medication compliance, adverse effects.  4. Hypertensive heart disease with chronic diastolic congestive heart failure (HCC) EF 60 to 65% from echo of 09/2021, grade 2 DD Cardiac perfusion scan revealed no evidence of ischemia or infarction, low risk study, minimal coronary calcifications.  5. Graves disease TSH is still suppressed She has not been on methimazole and somewhere along the line between her two endocrine visits there might have been some confusion with regards to her medication as the patient states she was informed to discontinue it.  She initially saw [facility] and later saw [facility] end[facility]ology I have provided  her with the number to [name] endocrinology to schedule a follow-up appointment. Advised she does not need to see 2 different endocrinologist but needs to pick 1 that she would like to stick with for proper coordination of care She is also yet to undergo her thyroid uptake scan    Meds ordered this encounter  Medications   polyethylene glycol powder (GLYCOLAX/MIRALAX) 17 GM/SCOOP powder    Sig: Take 17 g by mouth daily.    Dispense:  3350 g    Refill:  1    Follow-up: Return in about 3 months (around 03/08/2023) for Chronic medical conditions.       Hoy Register, MD, FAAFP. [facility] [place], [place] [phone]   12/06/2022, 1:16 PM

## 2022-12-06 NOTE — Patient Instructions (Signed)
[address], [place], [place] [address] Hours:  Open ? Closes 5?PM Phone: [phone]

## 2022-12-20 DIAGNOSIS — E0500 Thyrotoxicosis with diffuse goiter without thyrotoxic crisis or storm: Secondary | ICD-10-CM | POA: Diagnosis not present

## 2023-01-03 ENCOUNTER — Other Ambulatory Visit: Payer: 59 | Admitting: Obstetrics and Gynecology

## 2023-01-03 NOTE — Patient Outreach (Signed)
  Medicaid Managed Care   Unsuccessful Attempt Note   01/03/2023 Name: [name] [name] MRN: [id number] DOB: 27-Jun-1961  Referred by: Hoy Register, MD Reason for referral : High Risk Managed Medicaid (Unsuccessful telephone outreach)  An unsuccessful telephone outreach was attempted today. The patient was referred to the case management team for assistance with care management and care coordination.    Follow Up Plan: The Managed Medicaid care management team will reach out to the patient again over the next 30 business  days. and The  Patient has been provided with contact information for the Managed Medicaid care management team and has been advised to call with any health related questions or concerns.   Kathi Der RN, BSN [place]  [employer] - Managed Medicaid High Risk [phone]

## 2023-01-03 NOTE — Patient Instructions (Signed)
Hi Ms. Carelli, I hope you are doing okay, I am sorry I missed you today  - as a part of your Medicaid benefit, you are eligible for care management and care coordination services at no cost or copay. I was unable to reach you by phone today but would be happy to help you with your health related needs. Please feel free to call me at [phone].   A member of the Managed Medicaid care management team will reach out to you again over the next 30 business days.   Kathi Der RN, BSN   [employer] - Managed Medicaid High Risk [phone]

## 2023-01-20 ENCOUNTER — Other Ambulatory Visit: Payer: Self-pay

## 2023-01-20 ENCOUNTER — Emergency Department (HOSPITAL_COMMUNITY)
Admission: EM | Admit: 2023-01-20 | Discharge: 2023-01-20 | Disposition: A | Discharge status: Home / Self Care | Payer: 59 | Attending: Emergency Medicine | Admitting: Emergency Medicine

## 2023-01-20 ENCOUNTER — Encounter (HOSPITAL_COMMUNITY): Payer: Self-pay | Admitting: *Deleted

## 2023-01-20 ENCOUNTER — Emergency Department (HOSPITAL_COMMUNITY): Payer: 59

## 2023-01-20 DIAGNOSIS — I110 Hypertensive heart disease with heart failure: Secondary | ICD-10-CM | POA: Diagnosis not present

## 2023-01-20 DIAGNOSIS — J069 Acute upper respiratory infection, unspecified: Secondary | ICD-10-CM | POA: Diagnosis not present

## 2023-01-20 DIAGNOSIS — Z79899 Other long term (current) drug therapy: Secondary | ICD-10-CM | POA: Insufficient documentation

## 2023-01-20 DIAGNOSIS — I509 Heart failure, unspecified: Secondary | ICD-10-CM | POA: Insufficient documentation

## 2023-01-20 DIAGNOSIS — R059 Cough, unspecified: Secondary | ICD-10-CM | POA: Diagnosis present

## 2023-01-20 DIAGNOSIS — E119 Type 2 diabetes mellitus without complications: Secondary | ICD-10-CM | POA: Insufficient documentation

## 2023-01-20 DIAGNOSIS — Z1152 Encounter for screening for COVID-19: Secondary | ICD-10-CM | POA: Diagnosis not present

## 2023-01-20 DIAGNOSIS — J449 Chronic obstructive pulmonary disease, unspecified: Secondary | ICD-10-CM | POA: Diagnosis not present

## 2023-01-20 LAB — BASIC METABOLIC PANEL
Anion gap: 14 (ref 5–15)
BUN: 35 mg/dL — ABNORMAL HIGH (ref 8–23)
CO2: 22 mmol/L (ref 22–32)
Calcium: 8.9 mg/dL (ref 8.9–10.3)
Chloride: 103 mmol/L (ref 98–111)
Creatinine, Ser: 2.33 mg/dL — ABNORMAL HIGH (ref 0.44–1.00)
GFR, Estimated: 23 mL/min — ABNORMAL LOW (ref >60)
Glucose, Bld: 123 mg/dL — ABNORMAL HIGH (ref 70–99)
Potassium: 3.4 mmol/L — ABNORMAL LOW (ref 3.5–5.1)
Sodium: 139 mmol/L (ref 135–145)

## 2023-01-20 LAB — TROPONIN I (HIGH SENSITIVITY): Troponin I (High Sensitivity): 3 ng/L (ref <18)

## 2023-01-20 LAB — CBC
HCT: 32.7 % — ABNORMAL LOW (ref 36.0–46.0)
Hemoglobin: 10.3 g/dL — ABNORMAL LOW (ref 12.0–15.0)
MCH: 29.2 pg (ref 26.0–34.0)
MCHC: 31.5 g/dL (ref 30.0–36.0)
MCV: 92.6 fL (ref 80.0–100.0)
Platelets: 127 10*3/uL — ABNORMAL LOW (ref 150–400)
RBC: 3.53 MIL/uL — ABNORMAL LOW (ref 3.87–5.11)
RDW: 13 % (ref 11.5–15.5)
WBC: 3.7 10*3/uL — ABNORMAL LOW (ref 4.0–10.5)
nRBC: 0 % (ref 0.0–0.2)

## 2023-01-20 LAB — RESP PANEL BY RT-PCR (RSV, FLU A&B, COVID)  RVPGX2
Influenza A by PCR: NEGATIVE
Influenza B by PCR: NEGATIVE
Resp Syncytial Virus by PCR: NEGATIVE
SARS Coronavirus 2 by RT PCR: NEGATIVE

## 2023-01-20 MED ORDER — BENZONATATE 100 MG PO CAPS: 100.0000 mg | ORAL_CAPSULE | Freq: Three times a day (TID) | ORAL | 0 refills | Status: AC

## 2023-01-20 NOTE — ED Triage Notes (Signed)
Pt exposed to Covid by a friend; reports headache and cough that started last night, requesting to be tested

## 2023-01-20 NOTE — ED Notes (Signed)
Pt is a&ox4, warm and dry to touch. Pt states she feels tired and has a dry cough. Pt denies any cp/shob/fevers/n/v/d. Pt attached to monitor/vitals. IV established. Side rails up x 2, call light with pt, covered with multiple blankets. Lights out per pt request.

## 2023-01-20 NOTE — Discharge Instructions (Signed)
At this time there does not appear to be the presence of an emergent medical condition, however there is always the potential for conditions to change. Please read and follow the below instructions.  Please return to the Emergency Department immediately for any new or worsening symptoms or if your symptoms do not improve within 3 days. Please be sure to follow up with your Primary Care Provider within one week regarding your visit today; please call their office to schedule an appointment even if you are feeling better for a follow-up visit. You may use the Tessalon as prescribed to help with your cough.  Please rehydrating it plenty of rest.  As we discussed you may still have COVID or another viral illness even with the negative testing today.  Please call your primary care provider today to schedule follow-up appointment for recheck either tomorrow or on Monday.  Please return to the emerged part immediately if you have any worsening of symptoms.   Please read the additional information packets attached to your discharge summary.  Go to the nearest Emergency Department immediately if: You have fever or chills You have shortness of breath You have very bad or constant: Headache. Ear pain. Pain in your forehead, behind your eyes, and over your cheekbones (sinus pain). Chest pain. You have long-lasting (chronic) lung disease along with any of these: Making high-pitched whistling sounds when you breathe, most often when you breathe out (wheezing). Long-lasting cough (more than 14 days). Coughing up blood. A change in your usual mucus. You have a stiff neck. You have changes in your: Vision. Hearing. Thinking. Mood.      You have any new/concerning or worsening of symptoms.  Do not take your medicine if  develop an itchy rash, swelling in your mouth or lips, or difficulty breathing; call 911 and seek immediate emergency medical attention if this occurs.  You may review your lab tests  and imaging results in their entirety on your MyChart account.  Please discuss all results of fully with your primary care provider and other specialist at your follow-up visit.  Note: Portions of this text may have been transcribed using voice recognition software. Every effort was made to ensure accuracy; however, inadvertent computerized transcription errors may still be present.

## 2023-01-20 NOTE — ED Provider Notes (Signed)
[place] EMERGENCY DEPARTMENT AT [facility] Provider Note   CSN: [id number] Arrival date & time: 01/20/23  4098     History  Chief Complaint  Patient presents with   Headache   Cough    [name] [name] is a 62 y.o. female 62 year old female with history that includes diabetes, CHF, chronic hepatitis C, MRSA, hypertension, chronic anemia, COPD, GERD, morbid obesity.  Triage note: Patient exposed to COVID and reports headache and cough that started last night requesting COVID test.  History per patient: Patient reports that her friend has been sick with a productive cough over the past week.  Patient notes that over the past 2 days she herself has developed a nonproductive cough, she reports her cough is mild.  She does report a mild aching sensation in her chest whenever she coughs denies chest pain at rest or with her regular breath.  She does report a mild headache yesterday which resolved spontaneously, no active headache.  She reports a small amount of diarrhea yesterday as well, none today.  She denies any vision changes, neck pain, sore throat, sputum production, hemoptysis, abdominal pain, nausea, vomiting, extremity swelling/color change or additional concerns.  Patient reports that she took a home COVID test which was positive yesterday but she wanted to come here for an official COVID test.  No additional concerns.  HPI     Home Medications Prior to Admission medications   Medication Sig Start Date End Date Taking? Authorizing Provider  benzonatate (TESSALON) 100 MG capsule Take 1 capsule (100 mg total) by mouth every 8 (eight) hours for 5 days. 01/20/23 01/25/23 Yes Harlene Salts A, PA-C  albuterol (VENTOLIN HFA) 108 (90 Base) MCG/ACT inhaler Inhale 2 puffs into the lungs every 4 (four) hours as needed for wheezing or shortness of breath. 12/01/22   Hoy Register, MD  amLODipine (NORVASC) 2.5 MG tablet Take 1 tablet (2.5 mg total) by mouth daily. 11/12/22    Milford, Anderson Malta, FNP  atorvastatin (LIPITOR) 40 MG tablet Take 1 tablet (40 mg total) by mouth every morning. 11/12/22   Milford, Anderson Malta, FNP  carboxymethylcellul-glycerin (LUBRICATING EYE DROPS) 0.5-0.9 % ophthalmic solution Place 1 drop into both eyes daily as needed (dry eyes). 12/01/22   Hoy Register, MD  carvedilol (COREG) 25 MG tablet Take 1 tablet (25 mg total) by mouth 2 (two) times daily. 11/12/22   Milford, Anderson Malta, FNP  empagliflozin (JARDIANCE) 10 MG TABS tablet Take 1 tablet (10 mg total) by mouth daily. 11/12/22   Milford, Anderson Malta, FNP  famotidine (PEPCID) 40 MG tablet Take 1 tablet (40 mg total) by mouth daily. 11/30/22   Hoy Register, MD  hydrALAZINE (APRESOLINE) 25 MG tablet Take 1 tablet (25 mg total) by mouth 3 (three) times daily. 11/18/22 02/16/23  Jacklynn Ganong, FNP  hydrALAZINE (APRESOLINE) 50 MG tablet Take 1 tablet (50 mg total) by mouth 3 (three) times daily. Patient not taking: Reported on 12/06/2022 11/18/22   Jacklynn Ganong, FNP  oxybutynin (DITROPAN) 5 MG tablet Take 1 tablet (5 mg total) by mouth 2 (two) times daily. 11/30/22   Hoy Register, MD  oxyCODONE (OXY IR/ROXICODONE) 5 MG immediate release tablet Take 1 tablet (5 mg total) by mouth every 6 (six) hours as needed for severe pain. 12/02/22   Cornett, Maisie Fus, MD  polyethylene glycol powder (GLYCOLAX/MIRALAX) 17 GM/SCOOP powder Take 17 g by mouth daily. 12/06/22   Hoy Register, MD  pregabalin (LYRICA) 75 MG capsule Take 1 capsule (75  mg total) by mouth 2 (two) times daily. 12/01/22   Hoy Register, MD  pregabalin (LYRICA) 75 MG capsule Take 1 capsule (75 mg total) by mouth 2 (two) times daily. 11/30/22   Hoy Register, MD  Semaglutide, 1 MG/DOSE, 4 MG/3ML SOPN Inject 1 mg as directed once a week. 11/30/22   Hoy Register, MD  spironolactone (ALDACTONE) 25 MG tablet Take 0.5 tablets (12.5 mg total) by mouth daily. 11/12/22   Jacklynn Ganong, FNP  torsemide (DEMADEX) 20 MG tablet Take 1 tablet (20  mg total) by mouth daily. 11/12/22   Jacklynn Ganong, FNP      Allergies    Lisinopril    Review of Systems   Review of Systems Ten systems are reviewed and are negative for acute change except as noted in the HPI  Physical Exam Updated Vital Signs BP 116/62 (BP Location: Right Arm)   Pulse 67   Temp 97.7 F (36.5 C) (Oral)   Resp 13   LMP 04/01/2010   SpO2 96%  Physical Exam Constitutional:      General: She is not in acute distress.    Appearance: Normal appearance. She is well-developed. She is not ill-appearing or diaphoretic.  HENT:     Head: Normocephalic and atraumatic.  Eyes:     General: Vision grossly intact. Gaze aligned appropriately.     Pupils: Pupils are equal, round, and reactive to light.  Neck:     Trachea: Trachea and phonation normal.  Cardiovascular:     Rate and Rhythm: Normal rate and regular rhythm.     Pulses:          Dorsalis pedis pulses are 2+ on the right side and 2+ on the left side.     Heart sounds: Normal heart sounds.  Pulmonary:     Effort: Pulmonary effort is normal. No respiratory distress.     Breath sounds: Normal breath sounds and air entry.  Abdominal:     General: There is no distension.     Palpations: Abdomen is soft.     Tenderness: There is no abdominal tenderness. There is no guarding or rebound.  Musculoskeletal:        General: Normal range of motion.     Cervical back: Normal range of motion.     Right lower leg: No edema.     Left lower leg: No edema.  Skin:    General: Skin is warm and dry.  Neurological:     Mental Status: She is alert.     GCS: GCS eye subscore is 4. GCS verbal subscore is 5. GCS motor subscore is 6.     Comments: Speech is clear and goal oriented, follows commands Major Cranial nerves without deficit, no facial droop Moves extremities without ataxia, coordination intact 5/5 strength with bilateral grip, push/pull, dorsi/plantarflexion and hip flexion. Sensation intact to all 4  extremities.  Psychiatric:        Behavior: Behavior normal.     ED Results / Procedures / Treatments   Labs (all labs ordered are listed, but only abnormal results are displayed) Labs Reviewed  BASIC METABOLIC PANEL - Abnormal; Notable for the following components:      Result Value   Potassium 3.4 (*)    Glucose, Bld 123 (*)    BUN 35 (*)    Creatinine, Ser 2.33 (*)    GFR, Estimated 23 (*)    All other components within normal limits  CBC - Abnormal; Notable for the  following components:   WBC 3.7 (*)    RBC 3.53 (*)    Hemoglobin 10.3 (*)    HCT 32.7 (*)    Platelets 127 (*)    All other components within normal limits  RESP PANEL BY RT-PCR (RSV, FLU A&B, COVID)  [id number]  TROPONIN I (HIGH SENSITIVITY)    EKG EKG Interpretation Date/Time:  Thursday January 20 2023 07:52:27 EDT Ventricular Rate:  72 PR Interval:  189 QRS Duration:  102 QT Interval:  417 QTC Calculation: 457 R Axis:   22  Text Interpretation: Sinus rhythm No significant change since last tracing Confirmed by Gwyneth Sprout ([id number]) on 01/20/2023 8:09:30 AM  Radiology DG Chest 2 View  Result Date: 01/20/2023 CLINICAL DATA:  Productive cough with headache. EXAM: CHEST - 2 VIEW COMPARISON:  09/14/2022 FINDINGS: The heart size and mediastinal contours are within normal limits. Both lungs are clear. The visualized skeletal structures are unremarkable. IMPRESSION: No active cardiopulmonary disease. Electronically Signed   By: Signa Kell M.D.   On: 01/20/2023 07:02    Procedures Procedures    Medications Ordered in ED Medications - No data to display  ED Course/ Medical Decision Making/ A&P Clinical Course as of 01/20/23 0947  Thu Jan 20, 2023  8295 DG Chest 2 View Personally reviewed and interpreted patient's two-view chest x-ray.  I do not appreciate any obvious PTX, PNA, effusion or other emergent findings.  Will await radiologist interpretation. [BM]  0805 EKG 12-Lead I personally reviewed  patient's twelve-lead EKG.  Shows normal sinus rhythm with a rate of 72.  I do not appreciate any obvious acute ischemic changes or arrhythmia. [BM]  0848 Resp panel by RT-PCR (RSV, Flu A&B, Covid) Anterior Nasal Swab COVID-negative RSV negative Influenza panel negative [BM]  0849 CBC(!) CBC shows baseline hemoglobin of 10.3.  Baseline thrombocytopenia at 127.  No leukocytosis, baseline leukopenia of 3.7. [BM]  0855 Troponin I (High Sensitivity) Troponin within normal limits.  No active chest pain.  Symptoms onset yesterday.  No indication for delta troponin. [BM]  [id number] Basic metabolic panel(!) BMP shows baseline renal function with creatinine of 2.33 and BUN of 35.  Similar to prior.  No emergent electrolyte derangement or gap.  Minimally decreased potassium at 3.4. [BM]    Clinical Course User Index [BM] Bill Salinas PA-C                             Medical Decision Making 62 year old female presented to the urgency department today.  Complaints in triage included a nonproductive cough and a headache that occurred yesterday.  On my evaluation patient reports her headache is completely resolved.  She denies any sputum production or hemoptysis.  Overall she is well-appearing and in no acute distress.  Her primary concern is that she wants a COVID test for an official result.  She reports that her home COVID test yesterday was positive and that her friend also had a positive home COVID test.  She does report a mild chest pain only when she coughs this resolves when she stops coughing.  She denies any hemoptysis or history of DVT.  No leg swelling.  No additional concerns today.  Overall she is well-appearing and in no acute distress.  Cranial nerves intact.  No meningeal signs.  Airway clear.  Cardiopulmonary exam unremarkable.  Abdomen soft nontender.  She is neurovascular intact to all 4 extremities without evidence for DVT.  A COVID test  was ordered in triage along with a chest x-ray.   Will add CBC, BMP and troponin given her complaint of chest pain considering she does have some prior medical conditions including diabetes and obesity.  I have a low suspicion overall for ACS at this time.  Additionally symptoms do not sound consistent with pulmonary embolism.  Will await labs, x-ray and EKG and continue monitoring the patient. - Patient was reassessed around 8:30 AM.  She is resting comfortably bed, no acute distress.  Vital signs within normal limits.  No complaints at this time.  Amount and/or Complexity of Data Reviewed External Data Reviewed: notes.    Details: Reviewed EMR, patient's last ER encounter was 09/14/2022 for URI, bronchitis.  She was treated with azithromycin at that visit.  EMR review also shows patient had a lumpectomy back in May.  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 Labs: ordered. Decision-making details documented in ED Course. Radiology: ordered. Decision-making details documented in ED Course. ECG/medicine tests: ordered. Decision-making details documented in ED Course.  Risk Risk Details: 9:28 AM: Patient reassessed.  She is resting company bed, sleeping.  She is easily arousable to voice.  She reports that she feels well and would like to go home.  I updated patient on labs above, she stated understanding. She denies any complaints at this time she is very happy that her COVID test was negative.  She is aware that she could still potentially have COVID even with a negative test.  I asked that she call her primary care provider and schedule a checkup with them either tomorrow or on Monday.  Her vital signs are all within normal limits, SpO2 98% on room air.  No tachycardia, no tachypnea, no fever.  Blood pressure also within normal limits.    No chest pain or difficulty breathing.  I have low suspicion for ACS, pulmonary embolism, pneumonia, CHF exacerbation or other emergent  pathologies at this time.  There is no indication for antibiotics at this time.  Strict ER precautions were given to the patient and she stated understanding.  Additionally as to her headache, it is resolved.  She has a reassuring neuroexam.  No meningeal signs.  I have a low suspicion for meningitis, SAH, subdural or other emergent pathologies.  She has been headache free over the past day, suspect mild headache yesterday secondary to her viral illness.  ER precautions given.    At this time there does not appear to be any evidence of an acute emergency medical condition and the patient appears stable for discharge with appropriate outpatient follow up. Diagnosis was discussed with patient who verbalizes understanding of care plan and is agreeable to discharge. I have discussed return precautions with patient who verbalizes understanding. Patient encouraged to follow-up with their PCP. All questions answered.  Patient's case discussed with Dr. Anitra Lauth who agrees with workup and plan to discharge with follow-up.   Note: Portions of this report may have been transcribed using voice recognition software. Every effort was made to ensure accuracy; however, inadvertent computerized transcription errors may still be present.         Final Clinical Impression(s) / ED Diagnoses Final diagnoses:  Viral upper respiratory tract infection    Rx / DC Orders ED Discharge Orders          Ordered    benzonatate (TESSALON) 100 MG capsule  Every 8 hours        01/20/23 0946  Bill Salinas, PA-C 01/20/23 1610    Gwyneth Sprout, MD 01/21/23 [phone]

## 2023-01-24 ENCOUNTER — Telehealth: Payer: Self-pay | Admitting: *Deleted

## 2023-01-24 NOTE — Transitions of Care (Post Inpatient/ED Visit) (Signed)
   01/24/2023  Name: [name] [name] MRN: [id number] DOB: 1960/11/21  Today's TOC FU Call Status: Today's TOC FU Call Status:: Unsuccessul Call (1st Attempt) Unsuccessful Call (1st Attempt) Date: 01/24/23  Attempted to reach the patient regarding the most recent Inpatient/ED visit.  Follow Up Plan: Additional outreach attempts will be made to reach the patient to complete the Transitions of Care (Post Inpatient/ED visit) call.   Estanislado Emms RN, BSN   Managed [facility] RN Care Coordinator [phone]

## 2023-01-25 ENCOUNTER — Other Ambulatory Visit: Payer: 59 | Admitting: Obstetrics and Gynecology

## 2023-01-25 NOTE — Patient Outreach (Signed)
  Medicaid Managed Care   Unsuccessful Attempt Note   01/25/2023 Name: [name] [name] MRN: [id number] DOB: [birth year]  Referred by: Hoy Register, MD Reason for referral : High Risk Managed Medicaid (Unsuccessful telephone outreach)  A second unsuccessful telephone outreach was attempted today. The patient was referred to the case management team for assistance with care management and care coordination.    Follow Up Plan: The Managed Medicaid care management team will reach out to the patient again over the next 30 business  days. and The  Patient has been provided with contact information for the Managed Medicaid care management team and has been advised to call with any health related questions or concerns.   Kathi Der RN, BSN [place]  [employer] - Managed Medicaid High Risk [phone]

## 2023-01-25 NOTE — Patient Instructions (Signed)
Hi [name] [name], I am sorry to have missed you again today, I hope all is okay- as a part of your Medicaid benefit, you are eligible for care management and care coordination services at no cost or copay. I was unable to reach you by phone today but would be happy to help you with your health related needs. Please feel free to call me at [phone].  A member of the Managed Medicaid care management team will reach out to you again over the next 30 business  days.   Kathi Der RN, BSN [place]  [employer] - Managed Medicaid High Risk [phone]

## 2023-01-27 ENCOUNTER — Telehealth: Payer: Self-pay | Admitting: *Deleted

## 2023-01-27 NOTE — Transitions of Care (Post Inpatient/ED Visit) (Signed)
   01/27/2023  Name: [name] [name] MRN: [id number] DOB: 01/15/1961  Today's TOC FU Call Status: Today's TOC FU Call Status:: Unsuccessful Call (2nd Attempt) Unsuccessful Call (2nd Attempt) Date: 01/27/23  Attempted to reach the patient regarding the most recent Inpatient/ED visit.  Follow Up Plan: Additional outreach attempts will be made to reach the patient to complete the Transitions of Care (Post Inpatient/ED visit) call.   Estanislado Emms RN, BSN [place]  Managed [facility] RN Care Coordinator [phone]

## 2023-01-28 ENCOUNTER — Other Ambulatory Visit: Payer: Self-pay | Admitting: Family Medicine

## 2023-01-28 DIAGNOSIS — E1149 Type 2 diabetes mellitus with other diabetic neurological complication: Secondary | ICD-10-CM

## 2023-01-28 NOTE — Telephone Encounter (Signed)
Requested Prescriptions  Pending Prescriptions Disp Refills   OZEMPIC, 1 MG/DOSE, 4 MG/3ML SOPN [Pharmacy Med Name: Ozempic 1 mg/dose (4 mg/3 mL) subcutaneous pen injector] 3 mL 2    Sig: INJECT 1 MG UNDER THE SKIN ONCE a WEEK     Endocrinology:  Diabetes - GLP-1 Receptor Agonists - semaglutide Failed - 01/28/2023 12:21 PM      Failed - Cr in normal range and within 360 days    Creat  Date Value Ref Range Status  12/12/2012 0.88 0.50 - 1.10 mg/dL Final   Creatinine, Ser  Date Value Ref Range Status  01/20/2023 2.33 (H) 0.44 - 1.00 mg/dL Final   Creatinine,U  Date Value Ref Range Status  06/13/2007 45.3 mg/dL Final    Comment:    See lab report for associated comment(s)   Creatinine, Urine  Date Value Ref Range Status  09/15/2021 47 mg/dL Final    Comment:    Performed at [facility], [address] [place], [place] [address]         Passed - HBA1C in normal range and within 180 days    HbA1c, POC (controlled diabetic range)  Date Value Ref Range Status  12/06/2022 6.2 0.0 - 7.0 % Final         Passed - Valid encounter within last 6 months    Recent Outpatient Visits           1 month ago Type 2 diabetes mellitus with other neurologic complication, with long-term current use of insulin (HCC)   [place] [facility] [place], [place], MD   4 months ago Type 2 diabetes mellitus with other neurologic complication, with long-term current use of insulin (HCC)   [place] [facility] [place], [place], MD   8 months ago Type 2 diabetes mellitus with other neurologic complication, with long-term current use of insulin (HCC)   [place] [facility] [place], [place], MD   11 months ago 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

## 2023-02-02 ENCOUNTER — Telehealth: Payer: Self-pay

## 2023-02-02 NOTE — Telephone Encounter (Signed)
...   Medicaid Managed Care   Unsuccessful Outreach Note  02/02/2023 Name: [name] [name] MRN: [id number] DOB: October 19, 1960  Referred by: Hoy Register, MD Reason for referral : Appointment   Third unsuccessful telephone outreach was attempted today. The patient was referred to the case management team for assistance with care management and care coordination. The patient's primary care provider has been notified of our unsuccessful attempts to make or maintain contact with the patient. The care management team is pleased to engage with this patient at any time in the future should he/she be interested in assistance from the care management team.   Follow Up Plan: We have been unable to make contact with the patient for follow up. The care management team is available to follow up with the patient after provider conversation with the patient regarding recommendation for care management engagement and subsequent re-referral to the care management team.   [place]on Settle Care Guide  [facility] Managed  Care Guide [facility]  [phone]

## 2023-02-18 ENCOUNTER — Other Ambulatory Visit: Payer: 59 | Admitting: Obstetrics and Gynecology

## 2023-02-18 NOTE — Patient Instructions (Signed)
Hi Ms. Schweiger hope you are doing well - as a part of your Medicaid benefit, you are eligible for care management and care coordination services at no cost or copay.  We have been  unable to reach you by phone but would be happy to help you with your health related needs. Please feel free to call me at [phone].  Kathi Der RN, BSN [place]  [employer] - Managed Medicaid High Risk [phone]

## 2023-02-18 NOTE — Patient Outreach (Cosign Needed)
Care Coordination  02/18/2023  [name] [name] Oct 01, 1960 [id number]    Care Management/Care Coordination  RN Case Manager Case Closure Note  02/18/2023 Name: [name] [name] MRN: [id number] DOB: 1960/11/14  [name] [name] is a 62 y.o. year old female who is a primary care patient of [name] Register, MD. The care management/care coordination team was consulted for assistance with chronic disease management and/or care coordination needs.   Care Plan : General Plan of Care (Adult)  Updates made by Danie Chandler, RN since 02/18/2023 12:00 AM  Completed 02/18/2023   Problem: Quality of Life (General Plan of Care) Resolved 02/18/2023  Priority: High  Onset Date: 10/29/2020  Note:    02/18/23:  Unable to maintain contact with patient    Long-Range Goal: Quality of Life Maintained Completed 02/18/2023  Start Date: 10/29/2020  Expected End Date: 02/26/2023  Recent Progress: Not on track  Priority: High  Note:    CARE PLAN ENTRY Medicaid Managed Care (see longitudinal plan of care for additional care plan information)  Current Barriers:  Chronic case management needs related to chronic health conditions, HTN, DM, COPD, OSA, CKD, CHF, HLD, hypothyroid 02/18/23:  Unable to maintain contact with patient  Nurse Case Manager Clinical Goal(s):  Over the next 30 days, patient will verbalize understanding of plan for decreased swelling.  Over the next 30 days, patient will work with provider to address needs  Over the next 30 days, patient will continue to work with Pharmacist. Over the next 30 days, patient will attend all scheduled appointments.  Interventions:  Inter-disciplinary care team collaboration (see longitudinal plan of care) Reviewed medications with patient. Discussed plans with patient for ongoing care management follow up and provided patient with direct contact information for care management team. Collaboration with Pharmacist for review of medications. Pharmacy referral for  medication review. Collaborated with BSW for resources. BSW  Referral for resources-completed Collaborated with PCP for GI referral at patient request Care Guide referral for Medicare information Collaborated with Care Guide.  Heart Failure Interventions:  (Status:  New goal.) Long Term Goal Basic overview and discussion of pathophysiology of Heart Failure reviewed Provided education on low sodium diet Assessed need for readable accurate scales in home Provided education about placing scale on hard, flat surface Advised patient to weigh each morning after emptying bladder Discussed importance of daily weight and advised patient to weigh and record daily Discussed the importance of keeping all appointments with provider Assessed social determinant of health barriers    Chronic Kidney Disease Interventions:  (Status:  New goal.) Long Term Goal Evaluation of current treatment plan related to chronic kidney disease self management and patient's adherence to plan as established by provider      Reviewed prescribed diet  Reviewed medications with patient and discussed importance of compliance    Counseled on the importance of exercise goals with target of 150 minutes per week     Advised patient, providing education and rationale, to monitor blood pressure daily and record, calling PCP for findings outside established parameters    Discussed complications of poorly controlled blood pressure such as heart disease, stroke, circulatory complications, vision complications, kidney impairment, sexual dysfunction    Reviewed scheduled/upcoming provider appointments  Discussed plans with patient for ongoing care management follow up and provided patient with direct contact information for care management team    Assessed social determinant of health barriers    Last practice recorded BP readings:  BP Readings from Last 3 Encounters:  09/14/22 Marland Kitchen)  156/82  09/06/22 133/83  08/10/22 122/78   Most  recent eGFR/CrCl:  Lab Results  Component Value Date   EGFR 32 (L) 12/21/2021    No components found for: "CRCL"  COPD Interventions:  (Status:  New goal.) Long Term Goal Advised patient to track and manage COPD triggers Advised patient to self assesses COPD action plan zone and make appointment with provider if in the yellow zone for 48 hours without improvement Advised patient to engage in light exercise as tolerated 3-5 days a week to aid in the the management of COPD Provided education about and advised patient to utilize infection prevention strategies to reduce risk of respiratory infection Discussed the importance of adequate rest and management of fatigue with COPD Assessed social determinant of health barriers  Diabetes Interventions:  (Status:  New goal.) Long Term Goal Assessed patient's understanding of A1c goal: <7% Reviewed medications with patient and discussed importance of medication adherence Counseled on importance of regular laboratory monitoring as prescribed Discussed plans with patient for ongoing care management follow up and provided patient with direct contact information for care management team Reviewed scheduled/upcoming provider appointments  Advised patient, providing education and rationale, to check cbg as directed  and record, calling provider for findings outside established parameters Review of patient status, including review of consultants reports, relevant laboratory and other test results, and medications completed Assessed social determinant of health barriers Lab Results  Component Value Date   HGBA1C 6.5 09/06/2022   Hyperlipidemia Interventions:  (Status:  New goal.) Long Term Goal Medication review performed; medication list updated in electronic medical record.  Counseled on importance of regular laboratory monitoring as prescribed Reviewed importance of limiting foods high in cholesterol Assessed social determinant of health barriers    Hypertension Interventions:  (Status:  New goal.) Long Term Goal Last practice recorded BP readings:  BP Readings from Last 3 Encounters:  09/14/22 (!) 156/82  09/06/22 133/83  08/10/22 122/78   Most recent eGFR/CrCl:  Lab Results  Component Value Date   EGFR 32 (L) 12/21/2021    No components found for: "CRCL"  Evaluation of current treatment plan related to hypertension self management and patient's adherence to plan as established by provider Reviewed medications with patient and discussed importance of compliance Discussed plans with patient for ongoing care management follow up and provided patient with direct contact information for care management team Reviewed scheduled/upcoming provider appointments including:  Discussed complications of poorly controlled blood pressure such as heart disease, stroke, circulatory complications, vision complications, kidney impairment, sexual dysfunction Assessed social determinant of health barriers   Plan:  Patient will follow up with provider and fill all prescriptions   Plan: We have been unable to make contact with the patient for follow up. The care management team is available to follow up with the patient should new care management/care coordination needs arise.   Kathi Der RN, BSN [place]  [employer] - Managed Medicaid High Risk [phone]

## 2023-02-23 ENCOUNTER — Ambulatory Visit: Payer: 59 | Attending: Family Medicine

## 2023-02-23 VITALS — Ht 64.0 in | Wt 201.0 lb

## 2023-02-23 DIAGNOSIS — Z0000 Encounter for general adult medical examination without abnormal findings: Secondary | ICD-10-CM

## 2023-02-23 NOTE — Progress Notes (Signed)
Subjective:   [name] [name] is a 62 y.o. female who presents for Medicare Annual (Subsequent) preventive examination.  Visit Complete: Virtual  I connected with  [name] [name] on 02/23/23 by a audio enabled telemedicine application and verified that I am speaking with the correct person using two identifiers.  Patient Location: Home  Provider Location: Home Office  I discussed the limitations of evaluation and management by telemedicine. The patient expressed understanding and agreed to proceed.  Vital Signs: Per patient no change in vitals since last visit.  Review of Systems     Cardiac Risk Factors include: diabetes mellitus;dyslipidemia;hypertension     Objective:    Today's Vitals   02/23/23 1332  Weight: 201 lb (91.2 kg)  Height: 5\' 4"  (1.626 m)   Body mass index is 34.5 kg/m.     02/23/2023    1:53 PM 01/20/2023    6:16 AM 12/02/2022    6:15 AM 09/25/2021    8:11 AM 09/25/2021    8:00 AM 09/15/2021   10:55 PM 09/15/2021   12:42 PM  Advanced Directives  Does Patient Have a Medical Advance Directive? No No No  No No No  Would patient like information on creating a medical advance directive? Yes (MAU/Ambulatory/Procedural Areas - Information given)  No - Patient declined No - Patient declined  No - Patient declined     Current Medications (verified) Outpatient Encounter Medications as of 02/23/2023  Medication Sig   albuterol (VENTOLIN HFA) 108 (90 Base) MCG/ACT inhaler Inhale 2 puffs into the lungs every 4 (four) hours as needed for wheezing or shortness of breath.   amLODipine (NORVASC) 2.5 MG tablet Take 1 tablet (2.5 mg total) by mouth daily.   atorvastatin (LIPITOR) 40 MG tablet Take 1 tablet (40 mg total) by mouth every morning.   carboxymethylcellul-glycerin (LUBRICATING EYE DROPS) 0.5-0.9 % ophthalmic solution Place 1 drop into both eyes daily as needed (dry eyes).   carvedilol (COREG) 25 MG tablet Take 1 tablet (25 mg total) by mouth 2 (two) times daily.    empagliflozin (JARDIANCE) 10 MG TABS tablet Take 1 tablet (10 mg total) by mouth daily.   famotidine (PEPCID) 40 MG tablet Take 1 tablet (40 mg total) by mouth daily.   hydrALAZINE (APRESOLINE) 25 MG tablet Take 1 tablet (25 mg total) by mouth 3 (three) times daily.   hydrALAZINE (APRESOLINE) 50 MG tablet Take 1 tablet (50 mg total) by mouth 3 (three) times daily.   oxybutynin (DITROPAN) 5 MG tablet Take 1 tablet (5 mg total) by mouth 2 (two) times daily.   oxyCODONE (OXY IR/ROXICODONE) 5 MG immediate release tablet Take 1 tablet (5 mg total) by mouth every 6 (six) hours as needed for severe pain.   OZEMPIC, 1 MG/DOSE, 4 MG/3ML SOPN INJECT 1 MG UNDER THE SKIN ONCE a WEEK   polyethylene glycol powder (GLYCOLAX/MIRALAX) 17 GM/SCOOP powder Take 17 g by mouth daily.   pregabalin (LYRICA) 75 MG capsule Take 1 capsule (75 mg total) by mouth 2 (two) times daily.   pregabalin (LYRICA) 75 MG capsule Take 1 capsule (75 mg total) by mouth 2 (two) times daily.   spironolactone (ALDACTONE) 25 MG tablet Take 0.5 tablets (12.5 mg total) by mouth daily.   torsemide (DEMADEX) 20 MG tablet Take 1 tablet (20 mg total) by mouth daily.   No facility-administered encounter medications on file as of 02/23/2023.    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   Qualifier: Diagnosis of  By: Huntley Dec, Scott     Hypertension    MRSA (methicillin resistant Staphylococcus aureus)    Trichomonas    VAGINITIS, BACTERIAL, RECURRENT 08/15/2007   Qualifier: Diagnosis of  By: Barbaraann Barthel MD, [place]     Past Surgical History:  Procedure Laterality Date   BREAST BIOPSY Left 2018   BREAST BIOPSY Left 06/08/2022   BREAST BIOPSY Right 06/08/2022   BREAST BIOPSY Right 06/08/2022   [place] RT BREAST BX W LOC DEV 1ST LESION IMG  BX SPEC US GUIDE 06/08/2022 [facility]   BREAST BIOPSY Left 06/08/2022   [place] LT BREAST BX W LOC DEV 1ST LESION IMG BX SPEC US GUIDE 06/08/2022 [facility]   BREAST BIOPSY  12/01/2022   MM LT RADIOACTIVE SEED LOC MAMMO GUIDE 12/01/2022 [facility]   BREAST LUMPECTOMY WITH RADIOACTIVE SEED LOCALIZATION Left 12/02/2022   Procedure: LEFT BREAST LUMPECTOMY WITH RADIOACTIVE SEED LOCALIZATION;  Surgeon: Harriette Bouillon, MD;  Location: [facility] OR;  Service: General;  Laterality: Left;   CESAREAN SECTION     breech   Family History  Problem Relation Age of Onset   Colon cancer Mother    Liver disease Sister    Other Neg Hx    Breast cancer Neg Hx    Esophageal cancer Neg Hx    Rectal cancer Neg Hx    Tremor Neg Hx    Social History   Socioeconomic History   Marital status: Legally Separated    Spouse name: Not on file   Number of children: 1   Years of education: Not on file   Highest education level: 9th grade  Occupational History   Occupation: disability  Tobacco Use   Smoking status: Never   Smokeless tobacco: Never  Vaping Use   Vaping status: Never Used  Substance and Sexual Activity   Alcohol use: No   Drug use: Not Currently    Types: Cocaine, Marijuana    Comment: remote h/o cocaine 2015 and marijuana 2018   Sexual activity: Not Currently  Other Topics Concern   Not on file  Social History Narrative   Lives with friend, [name] and nephew.  One daughter.    Social Determinants of Health   Financial Resource Strain: Low Risk  (07/22/2022)   Overall Financial Resource Strain (CARDIA)    Difficulty of Paying Living Expenses: Not very hard  Food Insecurity: No Food Insecurity (02/23/2023)   Hunger Vital Sign    Worried About Running Out of Food in the Last Year: Never true    Ran Out of Food in the Last Year: Never true  Transportation Needs: No Transportation Needs (02/23/2023)   PRAPARE - Administrator, Civil Service (Medical): No    Lack of  Transportation (Non-Medical): No  Physical Activity: Inactive (02/23/2023)   Exercise Vital Sign    Days of Exercise per Week: 0 days    Minutes of Exercise per Session: 0 min  Stress: No Stress Concern Present (02/23/2023)   [employer] of Occupational Health - Occupational Stress Questionnaire    Feeling of Stress : Not at all  Social Connections: Moderately Isolated (02/23/2023)   Social Connection and Isolation Panel [NHANES]    Frequency of Communication with Friends and Family: More than three times a week    Frequency  of Social Gatherings with Friends and Family: Twice a week    Attends Religious Services: More than 4 times per year    Active Member of [employer] or Organizations: No    Attends Engineer, structural: Never    Marital Status: Divorced    Tobacco Counseling Counseling given: Not Answered   Clinical Intake:  Pre-visit preparation completed: Yes  Pain : No/denies pain     Diabetes: Yes CBG done?: No Did pt. bring in CBG monitor from home?: No  How often do you need to have someone help you when you read instructions, pamphlets, or other written materials from your doctor or pharmacy?: 3 - Sometimes  Interpreter Needed?: No  Information entered by :: Kandis Fantasia LPN   Activities of Daily Living    02/23/2023    1:53 PM 12/02/2022    6:25 AM  In your present state of health, do you have any difficulty performing the following activities:  Hearing? 0   Vision? 0   Difficulty concentrating or making decisions? 0   Walking or climbing stairs? 0   Dressing or bathing? 0   Doing errands, shopping? 0 0  Preparing Food and eating ? N   Using the Toilet? N   In the past six months, have you accidently leaked urine? N   Do you have problems with loss of bowel control? N   Managing your Medications? N   Managing your Finances? N   Housekeeping or managing your Housekeeping? N     Patient Care Team: Hoy Register, MD as PCP - General (Family  Medicine) Rollene Rotunda, MD as PCP - Cardiology (Cardiology) Duke, Roe Rutherford, PA as Physician Assistant (Cardiology)  Indicate any recent Medical Services you may have received from other than [facility] providers in the past year (date may be approximate).     Assessment:   This is a routine wellness examination for Esabelle.  Hearing/Vision screen Hearing Screening - Comments:: Denies hearing difficulties   Vision Screening - Comments:: No vision problems; will schedule routine eye exam soon    Dietary issues and exercise activities discussed:     Goals Addressed             This Visit's Progress    Increase physical activity       COMPLETED: Medication Management        Medicaid Managed Care CARE PLAN ENTRY (see longitudinal plan of care for additional care plan information)  Current Barriers:  Social, financial, community barriers:  Poor reading comprehension per patient Patient with complex health conditions multiple comorbidities including DM Self-manages medications. Does not use a pill box or other adherence strategies Pharmacy  [id number] - [place] ([place]), [place] - [address] [place] ([place]) [place] [address] Phone: [phone] Fax: [phone]     Pharmacist Clinical Goal(s):  Over the next 30days, patient will work with PharmD and provider towards optimized medication management  Interventions: Comprehensive medication review performed; medication list updated in electronic medical record Inter-disciplinary care team collaboration (see longitudinal plan of care)  Cholesterol Lab Results  Component Value Date   CHOL 147 01/29/2019   CHOL 158 08/16/2017   CHOL 172 11/08/2016   Lab Results  Component Value Date   HDL 59 01/29/2019   HDL 65 08/16/2017   HDL 80 11/08/2016   Lab Results  Component Value Date   LDLCALC 68 01/29/2019   LDLCALC 74 08/16/2017   LDLCALC 72 11/08/2016   Lab  Results   Component Value Date   TRIG 102 01/29/2019   TRIG 93 08/16/2017   TRIG 100 11/08/2016   Lab Results  Component Value Date   CHOLHDL 2.5 01/29/2019   CHOLHDL 2.4 08/16/2017   CHOLHDL 2.2 11/08/2016   No results found for: LDLDIRECT  Atorvastatin 40mg  Plan: At goal,  patient stable/ symptoms controlled   Edema Furosemide -Wears stockings, states they hurt when she wears. 3/7 days she wears them Plan: At goal,  patient stable/ symptoms controlled    DM Lab Results  Component Value Date   HGBA1C 6.3 06/03/2020   HGBA1C 6.8 01/30/2020   HGBA1C 10.0 (H) 08/18/2019   Lab Results  Component Value Date   MICROALBUR 4.17 (H) 06/30/2010   LDLCALC 68 01/29/2019   CREATININE 1.41 (H) 05/27/2020    Lab Results  Component Value Date   NA 141 05/27/2020   K 3.7 05/27/2020   CREATININE 1.41 (H) 05/27/2020   GFRNONAA 43 (L) 05/27/2020   GFRAA 38 (L) 11/08/2019   GLUCOSE 91 05/27/2020    ASA 81mg  Lantus 60units QD (Patient doing 65) Liraglutide 18mg  (Victoza) -sugars: 90's per patient Plan: At goal,  patient stable/ symptoms controlled   Cardio BP Readings from Last 3 Encounters:  06/03/20 (!) 150/86  05/28/20 (!) 161/96  05/27/20 (!) 158/86   Hydralazine 100mg  TID Labetalol 300mg  BID Plan: BP not at goal but could be nervous. Patient will get BP monitor in next month to get more accurate readings  Pain 06/30/20: No meds, 8-9/10 With meds, 0/10 Feet/Hands Nerve pain Pregabalin 75mg  BID Plan: At goal,  patient stable/ symptoms controlled   Patient Self Care Activities:  Patient will take medications as prescribed Patient will focus on improved adherence immediately   Initial goal documentation  Artelia Laroche, Pharm.D., Managed Medicaid Pharmacist - [phone]         Depression Screen    02/23/2023    1:51 PM 12/06/2022   11:37 AM 09/06/2022   10:55 AM 02/10/2022    9:41 AM 11/02/2021   10:14 AM 10/01/2021    2:06 PM 08/25/2021    4:15 PM  PHQ 2/9  Scores  PHQ - 2 Score 0 1 0 0 0 1 4  PHQ- 9 Score  4 1 0 1 5 13     Fall Risk    02/23/2023    1:52 PM 12/06/2022   10:44 AM 09/06/2022   10:46 AM 07/02/2022   10:39 AM 05/31/2022   11:27 AM  Fall Risk   Falls in the past year? 0 0 0 0 0  Number falls in past yr: 0 0 0 0 0  Injury with Fall? 0 0 0 0 0  Risk for fall due to : No Fall Risks No Fall Risks  No Fall Risks No Fall Risks  Follow up Falls prevention discussed;Education provided;Falls evaluation completed        MEDICARE RISK AT HOME:  Medicare Risk at Home - 02/23/23 1352     Any stairs in or around the home? No    If so, are there any without handrails? No    Home free of loose throw rugs in walkways, pet beds, electrical cords, etc? Yes    Adequate lighting in your home to reduce risk of falls? Yes    Life alert? No    Use of a cane, walker or w/c? No    Grab bars in the bathroom? Yes    Shower chair or bench in shower?  No    Elevated toilet seat or a handicapped toilet? No             TIMED UP AND GO:  Was the test performed?  No    Cognitive Function:    07/02/2022   10:39 AM  MMSE - Mini Mental State Exam  Orientation to time 5  Orientation to Place 5  Registration 3  Attention/ Calculation 5  Recall 3  Language- name 2 objects 2  Language- repeat 1  Language- follow 3 step command 3  Language- read & follow direction 1  Write a sentence 1  Copy design 1  Total score 30        02/23/2023    1:53 PM 07/02/2022   10:42 AM  6CIT Screen  What Year? 0 points 0 points  What month? 0 points 0 points  What time? 0 points 0 points  Count back from 20 0 points 0 points  Months in reverse 2 points 0 points  Repeat phrase 2 points   Total Score 4 points     Immunizations Immunization History  Administered Date(s) Administered   Fluad Quad(high Dose 65+) 04/12/2019   Influenza Whole 04/18/2006, 06/13/2007, 06/29/2010   Influenza,inj,Quad PF,6+ Mos 05/13/2014, 06/03/2020, 04/01/2021    PNEUMOCOCCAL CONJUGATE-20 04/01/2021   Pneumococcal Polysaccharide-23 01/22/2005, 11/18/2016   Td 01/22/2005    TDAP status: Due, Education has been provided regarding the importance of this vaccine. Advised may receive this vaccine at local pharmacy or Health Dept. Aware to provide a copy of the vaccination record if obtained from local pharmacy or Health Dept. Verbalized acceptance and understanding.  Flu Vaccine status: Due, Education has been provided regarding the importance of this vaccine. Advised may receive this vaccine at local pharmacy or Health Dept. Aware to provide a copy of the vaccination record if obtained from local pharmacy or Health Dept. Verbalized acceptance and understanding.  Pneumococcal vaccine status: Up to date  Covid-19 vaccine status: Information provided on how to obtain vaccines.   Qualifies for Shingles Vaccine? Yes   Zostavax completed No   Shingrix Completed?: No.    Education has been provided regarding the importance of this vaccine. Patient has been advised to call insurance company to determine out of pocket expense if they have not yet received this vaccine. Advised may also receive vaccine at local pharmacy or Health Dept. Verbalized acceptance and understanding.  Screening Tests Health Maintenance  Topic Date Due   COVID-19 Vaccine (1) Never done   Zoster Vaccines- Shingrix (1 of 2) Never done   DTaP/Tdap/Td (2 - Tdap) 01/23/2015   OPHTHALMOLOGY EXAM  10/27/2022   FOOT EXAM  11/03/2022   INFLUENZA VACCINE  02/17/2023   HEMOGLOBIN A1C  06/08/2023   Diabetic kidney evaluation - Urine ACR  09/07/2023   Colonoscopy  10/03/2023   Diabetic kidney evaluation - eGFR measurement  01/20/2024   Medicare Annual Wellness (AWV)  02/23/2024   PAP SMEAR-Modifier  04/01/2024   MAMMOGRAM  06/02/2024   Hepatitis C Screening  Completed   HIV Screening  Completed   HPV VACCINES  Aged Out    Health Maintenance  Health Maintenance Due  Topic Date Due    COVID-19 Vaccine (1) Never done   Zoster Vaccines- Shingrix (1 of 2) Never done   DTaP/Tdap/Td (2 - Tdap) 01/23/2015   OPHTHALMOLOGY EXAM  10/27/2022   FOOT EXAM  11/03/2022   INFLUENZA VACCINE  02/17/2023    Colorectal cancer screening: Type of screening: Colonoscopy. Completed 10/03/18.  Repeat every 5 years  Mammogram status: Completed 06/02/22. Repeat every year  Lung Cancer Screening: (Low Dose CT Chest recommended if Age 46-80 years, 20 pack-year currently smoking OR have quit w/in 15years.) does not qualify.   Lung Cancer Screening Referral: n/a  Additional Screening:  Hepatitis C Screening: does qualify; Completed 01/10/17  Vision Screening: Recommended annual ophthalmology exams for early detection of glaucoma and other disorders of the eye. Is the patient up to date with their annual eye exam?  No  Who is the provider or what is the name of the office in which the patient attends annual eye exams? none If pt is not established with a provider, would they like to be referred to a provider to establish care? No .   Dental Screening: Recommended annual dental exams for proper oral hygiene  Diabetic Foot Exam: Diabetic Foot Exam: Overdue, Pt has been advised about the importance in completing this exam. Pt is scheduled for diabetic foot exam on at next office visit.  Community Resource Referral / Chronic Care Management: CRR required this visit?  No   CCM required this visit?  No     Plan:     I have personally reviewed and noted the following in the patient's chart:   Medical and social history Use of alcohol, tobacco or illicit drugs  Current medications and supplements including opioid prescriptions. Patient is currently taking opioid prescriptions. Information provided to patient regarding non-opioid alternatives. Patient advised to discuss non-opioid treatment plan with their provider. Functional ability and status Nutritional status Physical activity Advanced  directives List of other physicians Hospitalizations, surgeries, and ER visits in previous 12 months Vitals Screenings to include cognitive, depression, and falls Referrals and appointments  In addition, I have reviewed and discussed with patient certain preventive protocols, quality metrics, and best practice recommendations. A written personalized care plan for preventive services as well as general preventive health recommendations were provided to patient.     Kandis Fantasia [place], [place]   07/24/1094   After Visit Summary: (Mail) Due to this being a telephonic visit, the after visit summary with patients personalized plan was offered to patient via mail   Nurse Notes: No concerns at this time

## 2023-02-23 NOTE — Patient Instructions (Addendum)
[name] [name] , Thank you for taking time to come for your Medicare Wellness Visit. I appreciate your ongoing commitment to your health goals. Please review the following plan we discussed and let me know if I can assist you in the future.   Referrals/Orders/Follow-Ups/Clinician Recommendations: Aim for 30 minutes of exercise or brisk walking, 6-8 glasses of water, and 5 servings of fruits and vegetables each day.  This is a list of the screening recommended for you and due dates:  Health Maintenance  Topic Date Due   COVID-19 Vaccine (1) Never done   Zoster (Shingles) Vaccine (1 of 2) Never done   DTaP/Tdap/Td vaccine (2 - Tdap) 01/23/2015   Eye exam for diabetics  10/27/2022   Complete foot exam   11/03/2022   Flu Shot  02/17/2023   Hemoglobin A1C  06/08/2023   Yearly kidney health urinalysis for diabetes  09/07/2023   Colon Cancer Screening  10/03/2023   Yearly kidney function blood test for diabetes  01/20/2024   Medicare Annual Wellness Visit  02/23/2024   Pap Smear  04/01/2024   Mammogram  06/02/2024   Hepatitis C Screening  Completed   HIV Screening  Completed   HPV Vaccine  Aged Out    Advanced directives: (ACP Link)Information on Advanced Care Planning can be found at [facility] [place] Advance Health Care Directives Advance Health Care Directives ([email or web address]   Next Medicare Annual Wellness Visit scheduled for next year: Yes  Preventive Care 62-64 Years, Female Preventive care refers to lifestyle choices and visits with your health care provider that can promote health and wellness. What does preventive care include? A yearly physical exam. This is also called an annual well check. Dental exams once or twice a year. Routine eye exams. Ask your health care provider how often you should have your eyes checked. Personal lifestyle choices, including: Daily care of your teeth and gums. Regular physical activity. Eating a healthy diet. Avoiding tobacco and  drug use. Limiting alcohol use. Practicing safe sex. Taking low-dose aspirin daily starting at age 36. Taking vitamin and mineral supplements as recommended by your health care provider. What happens during an annual well check? The services and screenings done by your health care provider during your annual well check will depend on your age, overall health, lifestyle risk factors, and family history of disease. Counseling  Your health care provider may ask you questions about your: Alcohol use. Tobacco use. Drug use. Emotional well-being. Home and relationship well-being. Sexual activity. Eating habits. Work and work Astronomer. Method of birth control. Menstrual cycle. Pregnancy history. Screening  You may have the following tests or measurements: Height, weight, and BMI. Blood pressure. Lipid and cholesterol levels. These may be checked every 5 years, or more frequently if you are over 43 years old. Skin check. Lung cancer screening. You may have this screening every year starting at age 24 if you have a 30-pack-year history of smoking and currently smoke or have quit within the past 15 years. Fecal occult blood test (FOBT) of the stool. You may have this test every year starting at age 68. Flexible sigmoidoscopy or colonoscopy. You may have a sigmoidoscopy every 5 years or a colonoscopy every 10 years starting at age 74. Hepatitis C blood test. Hepatitis B blood test. Sexually transmitted disease (STD) testing. Diabetes screening. This is done by checking your blood sugar (glucose) after you have not eaten for a while (fasting). You may have this done every 1-3 years. Mammogram. This may  be done every 1-2 years. Talk to your health care provider about when you should start having regular mammograms. This may depend on whether you have a family history of breast cancer. BRCA-related cancer screening. This may be done if you have a family history of breast, ovarian, tubal, or  peritoneal cancers. Pelvic exam and Pap test. This may be done every 3 years starting at age 51. Starting at age 44, this may be done every 5 years if you have a Pap test in combination with an HPV test. Bone density scan. This is done to screen for osteoporosis. You may have this scan if you are at high risk for osteoporosis. Discuss your test results, treatment options, and if necessary, the need for more tests with your health care provider. Vaccines  Your health care provider may recommend certain vaccines, such as: Influenza vaccine. This is recommended every year. Tetanus, diphtheria, and acellular pertussis (Tdap, Td) vaccine. You may need a Td booster every 10 years. Zoster vaccine. You may need this after age 53. Pneumococcal 13-valent conjugate (PCV13) vaccine. You may need this if you have certain conditions and were not previously vaccinated. Pneumococcal polysaccharide (PPSV23) vaccine. You may need one or two doses if you smoke cigarettes or if you have certain conditions. Talk to your health care provider about which screenings and vaccines you need and how often you need them. This information is not intended to replace advice given to you by your health care provider. Make sure you discuss any questions you have with your health care provider. Document Released: 08/01/2015 Document Revised: 03/24/2016 Document Reviewed: 05/06/2015 Elsevier Interactive Patient Education  2017 [employer].    Fall Prevention in the Home Falls can cause injuries. They can happen to people of all ages. There are many things you can do to make your home safe and to help prevent falls. What can I do on the outside of my home? Regularly fix the edges of walkways and driveways and fix any cracks. Remove anything that might make you trip as you walk through a door, such as a raised step or threshold. Trim any bushes or trees on the path to your home. Use bright outdoor lighting. Clear any walking  paths of anything that might make someone trip, such as rocks or tools. Regularly check to see if handrails are loose or broken. Make sure that both sides of any steps have handrails. Any raised decks and porches should have guardrails on the edges. Have any leaves, snow, or ice cleared regularly. Use sand or salt on walking paths during winter. Clean up any spills in your garage right away. This includes oil or grease spills. What can I do in the bathroom? Use night lights. Install grab bars by the toilet and in the tub and shower. Do not use towel bars as grab bars. Use non-skid mats or decals in the tub or shower. If you need to sit down in the shower, use a plastic, non-slip stool. Keep the floor dry. Clean up any water that spills on the floor as soon as it happens. Remove soap buildup in the tub or shower regularly. Attach bath mats securely with double-sided non-slip rug tape. Do not have throw rugs and other things on the floor that can make you trip. What can I do in the bedroom? Use night lights. Make sure that you have a light by your bed that is easy to reach. Do not use any sheets or blankets that are too big  for your bed. They should not hang down onto the floor. Have a firm chair that has side arms. You can use this for support while you get dressed. Do not have throw rugs and other things on the floor that can make you trip. What can I do in the kitchen? Clean up any spills right away. Avoid walking on wet floors. Keep items that you use a lot in easy-to-reach places. If you need to reach something above you, use a strong step stool that has a grab bar. Keep electrical cords out of the way. Do not use floor polish or wax that makes floors slippery. If you must use wax, use non-skid floor wax. Do not have throw rugs and other things on the floor that can make you trip. What can I do with my stairs? Do not leave any items on the stairs. Make sure that there are handrails  on both sides of the stairs and use them. Fix handrails that are broken or loose. Make sure that handrails are as long as the stairways. Check any carpeting to make sure that it is firmly attached to the stairs. Fix any carpet that is loose or worn. Avoid having throw rugs at the top or bottom of the stairs. If you do have throw rugs, attach them to the floor with carpet tape. Make sure that you have a light switch at the top of the stairs and the bottom of the stairs. If you do not have them, ask someone to add them for you. What else can I do to help prevent falls? Wear shoes that: Do not have high heels. Have rubber bottoms. Are comfortable and fit you well. Are closed at the toe. Do not wear sandals. If you use a stepladder: Make sure that it is fully opened. Do not climb a closed stepladder. Make sure that both sides of the stepladder are locked into place. Ask someone to hold it for you, if possible. Clearly mark and make sure that you can see: Any grab bars or handrails. First and last steps. Where the edge of each step is. Use tools that help you move around (mobility aids) if they are needed. These include: Canes. Walkers. Scooters. Crutches. Turn on the lights when you go into a dark area. Replace any light bulbs as soon as they burn out. Set up your furniture so you have a clear path. Avoid moving your furniture around. If any of your floors are uneven, fix them. If there are any pets around you, be aware of where they are. Review your medicines with your doctor. Some medicines can make you feel dizzy. This can increase your chance of falling. Ask your doctor what other things that you can do to help prevent falls. This information is not intended to replace advice given to you by your health care provider. Make sure you discuss any questions you have with your health care provider. Document Released: 05/01/2009 Document Revised: 12/11/2015 Document Reviewed:  08/09/2014 Elsevier Interactive Patient Education  2017 [employer].

## 2023-03-15 ENCOUNTER — Ambulatory Visit: Payer: 59 | Admitting: Family Medicine

## 2023-03-22 DIAGNOSIS — E0500 Thyrotoxicosis with diffuse goiter without thyrotoxic crisis or storm: Secondary | ICD-10-CM | POA: Diagnosis not present

## 2023-04-05 ENCOUNTER — Ambulatory Visit: Payer: 59 | Attending: Family Medicine | Admitting: Family Medicine

## 2023-04-05 VITALS — BP 152/89 | HR 95 | Ht 64.0 in | Wt 204.4 lb

## 2023-04-05 DIAGNOSIS — I5032 Chronic diastolic (congestive) heart failure: Secondary | ICD-10-CM

## 2023-04-05 DIAGNOSIS — N62 Hypertrophy of breast: Secondary | ICD-10-CM | POA: Diagnosis not present

## 2023-04-05 DIAGNOSIS — E1149 Type 2 diabetes mellitus with other diabetic neurological complication: Secondary | ICD-10-CM | POA: Diagnosis not present

## 2023-04-05 DIAGNOSIS — E0500 Thyrotoxicosis with diffuse goiter without thyrotoxic crisis or storm: Secondary | ICD-10-CM

## 2023-04-05 DIAGNOSIS — J069 Acute upper respiratory infection, unspecified: Secondary | ICD-10-CM | POA: Diagnosis not present

## 2023-04-05 DIAGNOSIS — Z7984 Long term (current) use of oral hypoglycemic drugs: Secondary | ICD-10-CM | POA: Diagnosis not present

## 2023-04-05 DIAGNOSIS — I110 Hypertensive heart disease with heart failure: Secondary | ICD-10-CM | POA: Diagnosis not present

## 2023-04-05 DIAGNOSIS — R197 Diarrhea, unspecified: Secondary | ICD-10-CM

## 2023-04-05 DIAGNOSIS — Z7985 Long-term (current) use of injectable non-insulin antidiabetic drugs: Secondary | ICD-10-CM

## 2023-04-05 DIAGNOSIS — R32 Unspecified urinary incontinence: Secondary | ICD-10-CM | POA: Diagnosis not present

## 2023-04-05 DIAGNOSIS — N1832 Chronic kidney disease, stage 3b: Secondary | ICD-10-CM

## 2023-04-05 DIAGNOSIS — E1122 Type 2 diabetes mellitus with diabetic chronic kidney disease: Secondary | ICD-10-CM

## 2023-04-05 DIAGNOSIS — Z23 Encounter for immunization: Secondary | ICD-10-CM | POA: Diagnosis not present

## 2023-04-05 DIAGNOSIS — Z794 Long term (current) use of insulin: Secondary | ICD-10-CM | POA: Diagnosis not present

## 2023-04-05 DIAGNOSIS — J449 Chronic obstructive pulmonary disease, unspecified: Secondary | ICD-10-CM

## 2023-04-05 LAB — POCT GLYCOSYLATED HEMOGLOBIN (HGB A1C): HbA1c, POC (controlled diabetic range): 6.3 % (ref 0.0–7.0)

## 2023-04-05 MED ORDER — DICYCLOMINE HCL 10 MG PO CAPS: 10.0000 mg | ORAL_CAPSULE | Freq: Three times a day (TID) | ORAL | 3 refills | Status: DC

## 2023-04-05 MED ORDER — OZEMPIC (1 MG/DOSE) 4 MG/3ML ~~LOC~~ SOPN
1.0000 mg | PEN_INJECTOR | SUBCUTANEOUS | 6 refills | Status: DC
Start: 2023-04-05 — End: 2023-05-13

## 2023-04-05 NOTE — Progress Notes (Signed)
Subjective:  Patient ID: [name] [name], female    DOB: 1961-01-05  Age: 62 y.o. MRN: [id number]  CC: Medical Management of Chronic Issues (Coughing up mucous, Questions about future breast reduction and bowel incontinence.)   HPI [name] [name] is a 62 y.o. year old female with a history of  type 2 diabetes mellitus (A1c 6.3), diabetic neuropathy, hypertension, diabetic retinopathy, hyperlipidemia, hepatitis C (completed treatment with harvoni), OSA, diastolic CHF (EF 60 to 65% from echo of 09/2021), stage III CKD, hyperthyroidism.   Interval History: Discussed the use of AI scribe software for clinical note transcription with the patient, who gave verbal consent to proceed.  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  She is interested in referral to have her breast reduced as she complains of them being too large.  I have referred her back in 08/2021 but per plastic surgery note her A1c is elevated at that time.   She presents with a three week history of diarrhea and incontinence. The diarrhea is described as watery and occurs spontaneously, often during sleep, leading to fecal incontinence. She denies abdominal cramping prior to bowel movements. She has been managing the incontinence with Depends.  The patient also reports a recent cough and congestion, which has improved with Nyquil. She denies fever, chills, and body aches. She also denies shortness of breath and chest pain, attributing occasional episodes to dietary intake.  The patient's neuropathy is well controlled on Lyrica. She reports no numbness in her hands or feet. Her diabetes is also well controlled with a recent A1c of 6.3. Her blood pressure is elevated and she endorses not taking her antihypertensive today as she was in a hurry. She had a visit with endocrine for management of her Graves' disease 2 weeks ago and thyroid labs were normal.  She has  been off methimazole. Last visit with the CHF clinic was in 10/2022.  She denies dyspnea or chest pain at this time but states at other times she does have intermittent dyspnea.  Her chart reveals a diagnosis of COPD and she is on albuterol MDI.  A PFT was ordered in the past but she never followed through with this.  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   Qualifier: Diagnosis of  By: Huntley Dec, Scott     Hypertension    MRSA (methicillin resistant Staphylococcus aureus)    Trichomonas    VAGINITIS, BACTERIAL, RECURRENT 08/15/2007   Qualifier: Diagnosis of  By: Barbaraann Barthel MD, [place]      Past Surgical History:  Procedure Laterality Date   BREAST BIOPSY Left 2018   BREAST BIOPSY Left 06/08/2022   BREAST BIOPSY Right 06/08/2022   BREAST BIOPSY Right 06/08/2022   [place] RT BREAST BX W LOC DEV 1ST LESION IMG BX SPEC US GUIDE 06/08/2022 [facility]   BREAST BIOPSY Left 06/08/2022   [place] LT BREAST BX W LOC DEV 1ST LESION IMG BX SPEC US GUIDE 06/08/2022 [facility]   BREAST BIOPSY  12/01/2022   MM LT RADIOACTIVE SEED LOC MAMMO GUIDE 12/01/2022 [facility]   BREAST LUMPECTOMY WITH RADIOACTIVE SEED LOCALIZATION Left 12/02/2022   Procedure: LEFT BREAST LUMPECTOMY WITH RADIOACTIVE SEED LOCALIZATION;  Surgeon: Harriette Bouillon, MD;  Location: [facility] OR;  Service:  General;  Laterality: Left;   CESAREAN SECTION     breech    Family History  Problem Relation Age of Onset   Colon cancer Mother    Liver disease Sister    Other Neg Hx    Breast cancer Neg Hx    Esophageal cancer Neg Hx    Rectal cancer Neg Hx    Tremor Neg Hx     Social History   Socioeconomic History   Marital status: Legally Separated    Spouse name: Not on file   Number of children: 1   Years of education: Not on file   Highest education level:  9th grade  Occupational History   Occupation: disability  Tobacco Use   Smoking status: Never   Smokeless tobacco: Never  Vaping Use   Vaping status: Never Used  Substance and Sexual Activity   Alcohol use: No   Drug use: Not Currently    Types: Cocaine, Marijuana    Comment: remote h/o cocaine 2015 and marijuana 2018   Sexual activity: Not Currently  Other Topics Concern   Not on file  Social History Narrative   Lives with friend, [name] and nephew.  One daughter.    Social Determinants of Health   Financial Resource Strain: Low Risk  (07/22/2022)   Overall Financial Resource Strain (CARDIA)    Difficulty of Paying Living Expenses: Not very hard  Food Insecurity: No Food Insecurity (02/23/2023)   Hunger Vital Sign    Worried About Running Out of Food in the Last Year: Never true    Ran Out of Food in the Last Year: Never true  Transportation Needs: No Transportation Needs (02/23/2023)   PRAPARE - Administrator, Civil Service (Medical): No    Lack of Transportation (Non-Medical): No  Physical Activity: Inactive (02/23/2023)   Exercise Vital Sign    Days of Exercise per Week: 0 days    Minutes of Exercise per Session: 0 min  Stress: No Stress Concern Present (02/23/2023)   [employer] of Occupational Health - Occupational Stress Questionnaire    Feeling of Stress : Not at all  Social Connections: Moderately Isolated (02/23/2023)   Social Connection and Isolation Panel [NHANES]    Frequency of Communication with Friends and Family: More than three times a week    Frequency of Social Gatherings with Friends and Family: Twice a week    Attends Religious Services: More than 4 times per year    Active Member of [employer] or Organizations: No    Attends Banker Meetings: Never    Marital Status: Divorced    Allergies  Allergen Reactions   Lisinopril Swelling    Outpatient Medications Prior to Visit  Medication Sig Dispense Refill   albuterol (VENTOLIN  HFA) 108 (90 Base) MCG/ACT inhaler Inhale 2 puffs into the lungs every 4 (four) hours as needed for wheezing or shortness of breath. 1 each 0   amLODipine (NORVASC) 2.5 MG tablet Take 1 tablet (2.5 mg total) by mouth daily. 90 tablet 1   atorvastatin (LIPITOR) 40 MG tablet Take 1 tablet (40 mg total) by mouth every morning. 90 tablet 1   carboxymethylcellul-glycerin (LUBRICATING EYE DROPS) 0.5-0.9 % ophthalmic solution Place 1 drop into both eyes daily as needed (dry eyes). 10 mL 0   carvedilol (COREG) 25 MG tablet Take 1 tablet (25 mg total) by mouth 2 (two) times daily. 180 tablet 3   empagliflozin (JARDIANCE) 10 MG TABS tablet Take 1 tablet (10  mg total) by mouth daily. 90 tablet 1   famotidine (PEPCID) 40 MG tablet Take 1 tablet (40 mg total) by mouth daily. 90 tablet 1   hydrALAZINE (APRESOLINE) 50 MG tablet Take 1 tablet (50 mg total) by mouth 3 (three) times daily. 135 tablet 8   oxybutynin (DITROPAN) 5 MG tablet Take 1 tablet (5 mg total) by mouth 2 (two) times daily. 60 tablet 6   oxyCODONE (OXY IR/ROXICODONE) 5 MG immediate release tablet Take 1 tablet (5 mg total) by mouth every 6 (six) hours as needed for severe pain. 15 tablet 0   pregabalin (LYRICA) 75 MG capsule Take 1 capsule (75 mg total) by mouth 2 (two) times daily. 180 capsule 1   pregabalin (LYRICA) 75 MG capsule Take 1 capsule (75 mg total) by mouth 2 (two) times daily. 180 capsule 1   spironolactone (ALDACTONE) 25 MG tablet Take 0.5 tablets (12.5 mg total) by mouth daily. 30 tablet 3   torsemide (DEMADEX) 20 MG tablet Take 1 tablet (20 mg total) by mouth daily. 90 tablet 6   OZEMPIC, 1 MG/DOSE, 4 MG/3ML SOPN INJECT 1 MG UNDER THE SKIN ONCE a WEEK 3 mL 2   polyethylene glycol powder (GLYCOLAX/MIRALAX) 17 GM/SCOOP powder Take 17 g by mouth daily. 3350 g 1   hydrALAZINE (APRESOLINE) 25 MG tablet Take 1 tablet (25 mg total) by mouth 3 (three) times daily. 270 tablet 3   No facility-administered medications prior to visit.      ROS Review of Systems  Constitutional:  Negative for activity change and appetite change.  HENT:  Negative for sinus pressure and sore throat.   Respiratory:  Positive for choking and shortness of breath. Negative for chest tightness and wheezing.   Cardiovascular:  Negative for chest pain and palpitations.  Gastrointestinal:  Positive for diarrhea. Negative for abdominal distention, abdominal pain and constipation.  Genitourinary: Negative.   Musculoskeletal: Negative.   Psychiatric/Behavioral:  Negative for behavioral problems and dysphoric mood.     Objective:  BP (!) 152/89   Pulse 95   Ht 5\' 4"  (1.626 m)   Wt 204 lb 6.4 oz (92.7 kg)   LMP 04/01/2010   SpO2 95%   BMI 35.09 kg/m      04/05/2023   11:11 AM 04/05/2023   10:34 AM 02/23/2023    1:32 PM  BP/Weight  Systolic BP 152 153 --  Diastolic BP 89 90 --  Wt. (Lbs)  204.4 201  BMI  35.09 kg/m2 34.5 kg/m2      Physical Exam Constitutional:      Appearance: She is well-developed.  Cardiovascular:     Rate and Rhythm: Normal rate.     Heart sounds: Normal heart sounds. No murmur heard. Pulmonary:     Effort: Pulmonary effort is normal.     Breath sounds: Normal breath sounds. No wheezing or rales.  Chest:     Chest wall: No tenderness.  Abdominal:     General: Bowel sounds are normal. There is no distension.     Palpations: Abdomen is soft. There is no mass.     Tenderness: There is no abdominal tenderness.  Musculoskeletal:        General: Normal range of motion.     Right lower leg: No edema.     Left lower leg: No edema.  Neurological:     Mental Status: She is alert and oriented to person, place, and time.  Psychiatric:        Mood and  Affect: Mood normal.        Latest Ref Rng & Units 01/20/2023    7:58 AM 12/02/2022    6:13 AM 11/10/2022   10:58 AM  CMP  Glucose 70 - 99 mg/dL 621  308  657   BUN 8 - 23 mg/dL 35  32  36   Creatinine 0.44 - 1.00 mg/dL 8.46  9.62  9.52   Sodium 135 - 145  mmol/L 139  138  140   Potassium 3.5 - 5.1 mmol/L 3.4  3.5  3.9   Chloride 98 - 111 mmol/L 103  106  109   CO2 22 - 32 mmol/L 22  23  22    Calcium 8.9 - 10.3 mg/dL 8.9  8.5  9.2   Total Protein 6.5 - 8.1 g/dL  6.7    Total Bilirubin 0.3 - 1.2 mg/dL  0.7    Alkaline Phos 38 - 126 U/L  86    AST 15 - 41 U/L  15    ALT 0 - 44 U/L  13      Lipid Panel     Component Value Date/Time   CHOL 148 09/06/2022 1135   TRIG 77 09/06/2022 1135   HDL 78 09/06/2022 1135   CHOLHDL 2.5 01/29/2019 1118   CHOLHDL 3 06/18/2014 0951   VLDL 19.6 06/18/2014 0951   LDLCALC 55 09/06/2022 1135    CBC    Component Value Date/Time   WBC 3.7 (L) 01/20/2023 0758   RBC 3.53 (L) 01/20/2023 0758   HGB 10.3 (L) 01/20/2023 0758   HGB 9.8 (L) 07/10/2019 1544   HCT 32.7 (L) 01/20/2023 0758   HCT 30.9 (L) 07/10/2019 1544   PLT 127 (L) 01/20/2023 0758   PLT 171 07/10/2019 1544   MCV 92.6 01/20/2023 0758   MCV 86 07/10/2019 1544   MCH 29.2 01/20/2023 0758   MCHC 31.5 01/20/2023 0758   RDW 13.0 01/20/2023 0758   RDW 12.7 07/10/2019 1544   LYMPHSABS 1.0 09/22/2021 1209   LYMPHSABS 1.3 07/10/2019 1544   MONOABS 0.4 09/22/2021 1209   EOSABS 0.0 09/22/2021 1209   EOSABS 0.0 07/10/2019 1544   BASOSABS 0.0 09/22/2021 1209   BASOSABS 0.0 07/10/2019 1544    Lab Results  Component Value Date   HGBA1C 6.3 04/05/2023    Assessment & Plan:      Incontinence with Diarrhea Sudden onset of watery diarrhea leading to incontinence, particularly at night, for the past three weeks. No abdominal cramping prior to bowel movements. -Start Dicyclomine to help manage symptoms. -Refer to Gastroenterology for further evaluation.  Large breast Patient has a history of a breast lump excision and left breast with pathology negative for malignancy and is interested in breast reduction. Previous referral to a plastic surgeon was made, but the patient does not recall the details of the visit. -Place another referral to  Plastic Surgery for breast reduction consultation.  Upper Respiratory Infection Patient reports recent onset of cough and congestion, but no fever, body aches, or shortness of breath. -Continue Nyquil as it seems to be helping with symptoms.  Hypertension Blood pressure was elevated during the visit, likely due to the patient not taking her medication prior to the appointment. -Recheck blood pressure at her next visit and ensure she takes her  medication prior to appointment -Increase amlodipine dose from 2.5 mg to 5 mg if blood pressure is above 140/80  Hypertensive heart disease with congestive heart failure EF 60 to 65% from echo 09/2021  Euvolemic Continue SGLT2i, hydralazine, torsemide Continue to follow-up with CHF clinic   General Health Maintenance -Administer influenza vaccine today. -Encourage patient to schedule an eye exam with her optometrist. -Follow-up in three months.          Meds ordered this encounter  Medications   dicyclomine (BENTYL) 10 MG capsule    Sig: Take 1 capsule (10 mg total) by mouth 3 (three) times daily before meals.    Dispense:  90 capsule    Refill:  3   Semaglutide, 1 MG/DOSE, (OZEMPIC, 1 MG/DOSE,) 4 MG/3ML SOPN    Sig: Inject 1 mg into the skin once a week.    Dispense:  3 mL    Refill:  6    This prescription was filled on 01/28/2023. Any refills authorized will be placed on file.    Follow-up: Return in about 1 week (around 04/12/2023) for nurse visit, BP check, Medical conditions with PCP, in 3 months.       Hoy Register, MD, FAAFP. [facility] [place], [place] [phone]   04/05/2023, 12:52 PM

## 2023-04-05 NOTE — Patient Instructions (Signed)
Managing Your Hypertension Hypertension, also called high blood pressure, is when the force of the blood pressing against the walls of the arteries is too strong. Arteries are blood vessels that carry blood from your heart throughout your body. Hypertension forces the heart to work harder to pump blood and may cause the arteries to become narrow or stiff. Understanding blood pressure readings A blood pressure reading includes a higher number over a lower number: The first, or top, number is called the systolic pressure. It is a measure of the pressure in your arteries as your heart beats. The second, or bottom number, is called the diastolic pressure. It is a measure of the pressure in your arteries as the heart relaxes. For most people, a normal blood pressure is below 120/80. Your personal target blood pressure may vary depending on your medical conditions, your age, and other factors. Blood pressure is classified into four stages. Based on your blood pressure reading, your health care provider may use the following stages to determine what type of treatment you need, if any. Systolic pressure and diastolic pressure are measured in a unit called millimeters of mercury (mmHg). Normal Systolic pressure: below 120. Diastolic pressure: below 80. Elevated Systolic pressure: 120-129. Diastolic pressure: below 80. Hypertension stage 1 Systolic pressure: 130-139. Diastolic pressure: 80-89. Hypertension stage 2 Systolic pressure: 140 or above. Diastolic pressure: 90 or above. How can this condition affect me? Managing your hypertension is very important. Over time, hypertension can damage the arteries and decrease blood flow to parts of the body, including the brain, heart, and kidneys. Having untreated or uncontrolled hypertension can lead to: A heart attack. A stroke. A weakened blood vessel (aneurysm). Heart failure. Kidney damage. Eye damage. Memory and concentration problems. Vascular  dementia. What actions can I take to manage this condition? Hypertension can be managed by making lifestyle changes and possibly by taking medicines. Your health care provider will help you make a plan to bring your blood pressure within a normal range. You may be referred for counseling on a healthy diet and physical activity. Nutrition  Eat a diet that is high in fiber and potassium, and low in salt (sodium), added sugar, and fat. An example eating plan is called the DASH diet. DASH stands for Dietary Approaches to Stop Hypertension. To eat this way: Eat plenty of fresh fruits and vegetables. Try to fill one-half of your plate at each meal with fruits and vegetables. Eat whole grains, such as whole-wheat pasta, brown rice, or whole-grain bread. Fill about one-fourth of your plate with whole grains. Eat low-fat dairy products. Avoid fatty cuts of meat, processed or cured meats, and poultry with skin. Fill about one-fourth of your plate with lean proteins such as fish, chicken without skin, beans, eggs, and tofu. Avoid pre-made and processed foods. These tend to be higher in sodium, added sugar, and fat. Reduce your daily sodium intake. Many people with hypertension should eat less than 1,500 mg of sodium a day. Lifestyle  Work with your health care provider to maintain a healthy body weight or to lose weight. Ask what an ideal weight is for you. Get at least 30 minutes of exercise that causes your heart to beat faster (aerobic exercise) most days of the week. Activities may include walking, swimming, or biking. Include exercise to strengthen your muscles (resistance exercise), such as weight lifting, as part of your weekly exercise routine. Try to do these types of exercises for 30 minutes at least 3 days a week. Do  not use any products that contain nicotine or tobacco. These products include cigarettes, chewing tobacco, and vaping devices, such as e-cigarettes. If you need help quitting, ask your  health care provider. Control any long-term (chronic) conditions you have, such as high cholesterol or diabetes. Identify your sources of stress and find ways to manage stress. This may include meditation, deep breathing, or making time for fun activities. Alcohol use Do not drink alcohol if: Your health care provider tells you not to drink. You are pregnant, may be pregnant, or are planning to become pregnant. If you drink alcohol: Limit how much you have to: 0-1 drink a day for women. 0-2 drinks a day for men. Know how much alcohol is in your drink. In the [place] one drink equals one 12 oz bottle of beer (355 mL), one 5 oz glass of wine (148 mL), or one 1 oz glass of hard liquor (44 mL). Medicines Your health care provider may prescribe medicine if lifestyle changes are not enough to get your blood pressure under control and if: Your systolic blood pressure is 130 or higher. Your diastolic blood pressure is 80 or higher. Take medicines only as told by your health care provider. Follow the directions carefully. Blood pressure medicines must be taken as told by your health care provider. The medicine does not work as well when you skip doses. Skipping doses also puts you at risk for problems. Monitoring Before you monitor your blood pressure: Do not smoke, drink caffeinated beverages, or exercise within 30 minutes before taking a measurement. Use the bathroom and empty your bladder (urinate). Sit quietly for at least 5 minutes before taking measurements. Monitor your blood pressure at home as told by your health care provider. To do this: Sit with your back straight and supported. Place your feet flat on the floor. Do not cross your legs. Support your arm on a flat surface, such as a table. Make sure your upper arm is at heart level. Each time you measure, take two or three readings one minute apart and record the results. You may also need to have your blood pressure checked regularly by  your health care provider. General information Talk with your health care provider about your diet, exercise habits, and other lifestyle factors that may be contributing to hypertension. Review all the medicines you take with your health care provider because there may be side effects or interactions. Keep all follow-up visits. Your health care provider can help you create and adjust your plan for managing your high blood pressure. Where to find more information National Heart, Lung, and Blood Institute: [employer]is American Heart Association: www.heart.org Contact a health care provider if: You think you are having a reaction to medicines you have taken. You have repeated (recurrent) headaches. You feel dizzy. You have swelling in your ankles. You have trouble with your vision. Get help right away if: You develop a severe headache or confusion. You have unusual weakness or numbness, or you feel faint. You have severe pain in your chest or abdomen. You vomit repeatedly. You have trouble breathing. These symptoms may be an emergency. Get help right away. Call 911. Do not wait to see if the symptoms will go away. Do not drive yourself to the hospital. Summary Hypertension is when the force of blood pumping through your arteries is too strong. If this condition is not controlled, it may put you at risk for serious complications. Your personal target blood pressure may vary depending on your medical conditions,  your age, and other factors. For most people, a normal blood pressure is less than 120/80. Hypertension is managed by lifestyle changes, medicines, or both. Lifestyle changes to help manage hypertension include losing weight, eating a healthy, low-sodium diet, exercising more, stopping smoking, and limiting alcohol. This information is not intended to replace advice given to you by your health care provider. Make sure you discuss any questions you have with your health care  provider. Document Revised: 03/19/2021 Document Reviewed: 03/19/2021 Elsevier Patient Education  2024 [employer].

## 2023-04-12 ENCOUNTER — Ambulatory Visit: Payer: 59

## 2023-04-14 ENCOUNTER — Other Ambulatory Visit (HOSPITAL_COMMUNITY): Payer: Self-pay | Admitting: Family Medicine

## 2023-04-14 DIAGNOSIS — E1149 Type 2 diabetes mellitus with other diabetic neurological complication: Secondary | ICD-10-CM

## 2023-04-14 DIAGNOSIS — E1122 Type 2 diabetes mellitus with diabetic chronic kidney disease: Secondary | ICD-10-CM

## 2023-04-14 DIAGNOSIS — I110 Hypertensive heart disease with heart failure: Secondary | ICD-10-CM

## 2023-04-28 ENCOUNTER — Institutional Professional Consult (permissible substitution): Payer: 59 | Admitting: Plastic Surgery

## 2023-05-12 ENCOUNTER — Other Ambulatory Visit: Payer: Self-pay | Admitting: Family Medicine

## 2023-05-12 DIAGNOSIS — E1149 Type 2 diabetes mellitus with other diabetic neurological complication: Secondary | ICD-10-CM

## 2023-05-12 DIAGNOSIS — Z794 Long term (current) use of insulin: Secondary | ICD-10-CM

## 2023-05-19 ENCOUNTER — Ambulatory Visit: Payer: 59 | Admitting: Plastic Surgery

## 2023-05-19 ENCOUNTER — Encounter: Payer: Self-pay | Admitting: Plastic Surgery

## 2023-05-19 VITALS — BP 145/86 | HR 69 | Ht 64.0 in | Wt 209.0 lb

## 2023-05-19 DIAGNOSIS — M542 Cervicalgia: Secondary | ICD-10-CM | POA: Diagnosis not present

## 2023-05-19 DIAGNOSIS — Z6835 Body mass index (BMI) 35.0-35.9, adult: Secondary | ICD-10-CM | POA: Diagnosis not present

## 2023-05-19 DIAGNOSIS — N62 Hypertrophy of breast: Secondary | ICD-10-CM

## 2023-05-19 DIAGNOSIS — M549 Dorsalgia, unspecified: Secondary | ICD-10-CM

## 2023-05-19 NOTE — Progress Notes (Signed)
Referring Provider Hoy Register, MD [address] [place],  [place] [address]   CC:  Chief Complaint  Patient presents with   Consult           [name] [name] is an 62 y.o. female.  HPI: [name] [name] is a 62 year old female who presents today with complaints of upper back and neck pain that she has had for many years and that she feels is due to the large size of her breast.  She also notes that her breasts interfere with her activities and make it difficult to find bras that fit appropriately.  The bras that she does have leave marks in her shoulders due to the weight of her breast.  Of note the patient did undergo a breast biopsy for a abnormality noted on mammogram.  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  She is due for follow-up mammogram in November of this year and will need to have that done prior to scheduling surgery.  Allergies  Allergen Reactions   Lisinopril Swelling    Outpatient Encounter Medications as of 05/19/2023  Medication Sig Note   albuterol (VENTOLIN HFA) 108 (90 Base) MCG/ACT inhaler Inhale 2 puffs into the lungs every 4 (four) hours as needed for wheezing or shortness of breath.    amLODipine (NORVASC) 2.5 MG tablet Take 1 Tablet by mouth once daily (morning)    atorvastatin (LIPITOR) 40 MG tablet take 1 Tablet by mouth in the morning (morning)    carboxymethylcellul-glycerin (LUBRICATING EYE DROPS) 0.5-0.9 % ophthalmic solution Place 1 drop into both eyes daily as needed (dry eyes).    carvedilol (COREG) 25 MG tablet Take 1 tablet (25 mg total) by mouth 2 (two) times daily.    dicyclomine (BENTYL) 10 MG capsule Take 1 capsule (10 mg total) by mouth 3 (three) times daily before meals.    famotidine (PEPCID) 40 MG tablet TAKE 1 Tablet BY MOUTH ONCE DAILY (evening)    hydrALAZINE (APRESOLINE) 50 MG tablet Take 1 tablet (50 mg total) by mouth 3 (three) times daily. 11/30/2022:  Takes with 25 mg to equal 75 mg tid    JARDIANCE 10 MG TABS tablet Take 1 Tablet by mouth once daily (morning)    oxybutynin (DITROPAN) 5 MG tablet Take 1 tablet (5 mg total) by mouth 2 (two) times daily.    pregabalin (LYRICA) 75 MG capsule Take 1 capsule (75 mg total) by mouth 2 (two) times daily.    pregabalin (LYRICA) 75 MG capsule TAKE ONE CAPSULE BY MOUTH TWICE DAILY (morning, evening)    Semaglutide, 1 MG/DOSE, (OZEMPIC, 1 MG/DOSE,) 4 MG/3ML SOPN INJECT 1 MG UNDER THE SKIN ONCE a WEEK    spironolactone (ALDACTONE) 25 MG tablet Take 0.5 tablets (12.5 mg total) by mouth daily.    torsemide (DEMADEX) 20 MG tablet Take 1 tablet (20 mg total) by mouth daily.    hydrALAZINE (APRESOLINE) 25 MG tablet Take 1 tablet (25 mg total) by mouth 3 (three) times daily. 11/30/2022: Takes with 50 mg to equal 75 mg tid    oxyCODONE (OXY IR/ROXICODONE) 5 MG immediate release tablet Take 1 tablet (5 mg total) by mouth every 6 (six) hours as needed for severe pain.    No facility-administered encounter medications on file as of 05/19/2023.     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   Qualifier: Diagnosis of  By: Huntley Dec, Scott     Hypertension    MRSA (methicillin resistant Staphylococcus aureus)    Trichomonas    VAGINITIS, BACTERIAL, RECURRENT 08/15/2007   Qualifier: Diagnosis of  By: Barbaraann Barthel MD, [place]      Past Surgical History:  Procedure Laterality Date   BREAST BIOPSY Left 2018   BREAST BIOPSY Left 06/08/2022   BREAST BIOPSY Right 06/08/2022   BREAST BIOPSY Right 06/08/2022   [place] RT BREAST BX W LOC DEV 1ST LESION IMG BX SPEC US GUIDE 06/08/2022 [facility]   BREAST BIOPSY Left 06/08/2022   [place] LT BREAST BX W LOC DEV 1ST LESION IMG BX SPEC US GUIDE 06/08/2022 [facility]   BREAST BIOPSY  12/01/2022   MM LT  RADIOACTIVE SEED LOC MAMMO GUIDE 12/01/2022 [facility]   BREAST LUMPECTOMY WITH RADIOACTIVE SEED LOCALIZATION Left 12/02/2022   Procedure: LEFT BREAST LUMPECTOMY WITH RADIOACTIVE SEED LOCALIZATION;  Surgeon: Harriette Bouillon, MD;  Location: [facility] OR;  Service: General;  Laterality: Left;   CESAREAN SECTION     breech    Family History  Problem Relation Age of Onset   Colon cancer Mother    Liver disease Sister    Other Neg Hx    Breast cancer Neg Hx    Esophageal cancer Neg Hx    Rectal cancer Neg Hx    Tremor Neg Hx     Social History   Social History Narrative   Lives with friend, [name] and nephew.  One daughter.      Review of Systems General: Denies fevers, chills, weight loss CV: Denies chest pain, shortness of breath, palpitations Breast: History of a sclerosing intradermal papilloma completely excised, complaints of upper back and neck pain secondary to large breast size.  Physical Exam    05/19/2023    8:48 AM 04/05/2023   11:11 AM 04/05/2023   10:34 AM  Vitals with BMI  Height 5\' 4"   5\' 4"   Weight 209 lbs  204 lbs 6 oz  BMI 35.86  35.07  Systolic 145 152 409  Diastolic 86 89 90  Pulse 69  95    General:  No acute distress,  Alert and oriented, Non-Toxic, Normal speech and affect Breast: Patient has very large pendulous breasts with grade 3 ptosis.  I do not palpate any dominant masses on physical exam and the nipples are normal in appearance without nipple discharge today.  Her sternal notch to nipple distance on the right is 39 cm and 42 cm on the left her fold to nipple distance on the right is 18 cm and 20 cm on the left. Mammogram: November 2024 BI-RADS 4.  This prompted the breast biopsy and the patient is due for a another mammogram which will be ordered today. Assessment/Plan [name] [name]: Patient has very large breasts and I believe that she would benefit from a bilateral breast reduction.  I believe that I can remove at least 800 g per breast.  I  discussed the procedure at length with [name] [name].  I showed her the location of the incisions and we discussed the unpredictable nature of scarring and wound healing.  We discussed the risks of bleeding, infection, and seroma formation.  She understands I will use drains postoperatively to help decrease the risk of seroma.  We discussed the risk of nipple loss due to nipple ischemia.  We also discussed the possibility that I will be more difficult to interpret her mammograms after a breast reduction.  We discussed the postoperative limitations including no heavy lifting, no vigorous activity, no submerging the incisions in water for 6 weeks.  She will be able to return to light activity as soon as she feels comfortable.  We discussed the risk of DVT after surgery and the importance of early ambulation.  All questions were answered to her satisfaction.  Photographs were obtained today with her consent.  Will place an order for her mammogram and schedule her for surgery at her request.  Santiago Glad 05/19/2023, 9:31 AM

## 2023-06-04 ENCOUNTER — Encounter (HOSPITAL_COMMUNITY): Payer: Self-pay | Admitting: *Deleted

## 2023-06-04 ENCOUNTER — Other Ambulatory Visit: Payer: Self-pay

## 2023-06-04 ENCOUNTER — Emergency Department (HOSPITAL_COMMUNITY): Payer: 59

## 2023-06-04 ENCOUNTER — Emergency Department (HOSPITAL_COMMUNITY)
Admission: EM | Admit: 2023-06-04 | Discharge: 2023-06-04 | Disposition: A | Discharge status: Home / Self Care | Payer: 59 | Attending: Emergency Medicine | Admitting: Emergency Medicine

## 2023-06-04 DIAGNOSIS — Z1152 Encounter for screening for COVID-19: Secondary | ICD-10-CM | POA: Insufficient documentation

## 2023-06-04 DIAGNOSIS — I5030 Unspecified diastolic (congestive) heart failure: Secondary | ICD-10-CM | POA: Insufficient documentation

## 2023-06-04 DIAGNOSIS — J069 Acute upper respiratory infection, unspecified: Secondary | ICD-10-CM | POA: Insufficient documentation

## 2023-06-04 DIAGNOSIS — R059 Cough, unspecified: Secondary | ICD-10-CM | POA: Diagnosis present

## 2023-06-04 LAB — RESP PANEL BY RT-PCR (RSV, FLU A&B, COVID)  RVPGX2
Influenza A by PCR: NEGATIVE
Influenza B by PCR: NEGATIVE
Resp Syncytial Virus by PCR: NEGATIVE
SARS Coronavirus 2 by RT PCR: NEGATIVE

## 2023-06-04 MED ORDER — BENZONATATE 100 MG PO CAPS: 100.0000 mg | ORAL_CAPSULE | Freq: Three times a day (TID) | ORAL | 0 refills | Status: DC

## 2023-06-04 MED ORDER — IPRATROPIUM BROMIDE 0.02 % IN SOLN
0.5000 mg | Freq: Once | RESPIRATORY_TRACT | Status: AC
  Administered 2023-06-04: 0.5 mg via RESPIRATORY_TRACT
  Filled 2023-06-04: qty 2.5

## 2023-06-04 MED ORDER — PROMETHAZINE-DM 6.25-15 MG/5ML PO SYRP: 5.0000 mL | ORAL_SOLUTION | Freq: Four times a day (QID) | ORAL | 0 refills | Status: AC | PRN

## 2023-06-04 MED ORDER — ALBUTEROL SULFATE (2.5 MG/3ML) 0.083% IN NEBU
5.0000 mg | INHALATION_SOLUTION | Freq: Once | RESPIRATORY_TRACT | Status: AC
  Administered 2023-06-04: 5 mg via RESPIRATORY_TRACT
  Filled 2023-06-04: qty 6

## 2023-06-04 NOTE — Discharge Instructions (Signed)
Please read and follow all provided instructions.  Your diagnoses today include:  1. Viral URI with cough     Tests performed today include: Chest x-ray - does not show any pneumonia Flu and COVID testing were negative Vital signs. See below for your results today.   Medications prescribed:  Promethazine cough syrup  Tessalon Perles - cough suppressant medication  Take any prescribed medications only as directed.  Home care instructions:  Follow any educational materials contained in this packet. Use home albuterol inhaler, 2 puffs every 4 hours, as needed for wheezing and chest tightness.  This may also help with cough.  Follow-up instructions: Please follow-up with your primary care provider in the next 3 days for further evaluation of your symptoms and a recheck if you are not feeling better.   Return instructions:  Please return to the Emergency Department if you experience worsening symptoms. Please return with worsening wheezing, shortness of breath, or difficulty breathing. Return with persistent fever above 101F.  Please return if you have any other emergent concerns.  Additional Information:  Your vital signs today were: BP (!) 157/82 (BP Location: Left Arm)   Pulse 72   Temp (!) 97.3 F (36.3 C) (Oral)   Resp 18   Ht 5\' 4"  (1.626 m)   Wt 94.8 kg   LMP 04/01/2010   SpO2 93%   BMI 35.87 kg/m  If your blood pressure (BP) was elevated above 135/85 this visit, please have this repeated by your doctor within one month. --------------

## 2023-06-04 NOTE — ED Provider Notes (Signed)
[place] EMERGENCY DEPARTMENT AT [facility] Provider Note   CSN: [id number] Arrival date & time: 06/04/23  3295     History  Chief Complaint  Patient presents with   Cough    [name] [name] is a 62 y.o. female.  Patient with history of heart failure (diastolic heart failure grade 2 as noted on echocardiogram March 2023) presents for evaluation of a cough and chest congestion ongoing over the past 5 days.  Patient denies associated fever.  She denies any concerns from a heart failure standpoint.  She is not currently taking diuretics (previously prescribed Torsemide).  She denies weight gain or swelling in her lower extremities, difficulties lying flat.  No fevers.  She has tried Mucinex without improvement.  No nasal congestion or runny nose.  She does have an albuterol inhaler at home but she has not been using this.       Home Medications Prior to Admission medications   Medication Sig Start Date End Date Taking? Authorizing Provider  albuterol (VENTOLIN HFA) 108 (90 Base) MCG/ACT inhaler Inhale 2 puffs into the lungs every 4 (four) hours as needed for wheezing or shortness of breath. 12/01/22   Hoy Register, MD  amLODipine (NORVASC) 2.5 MG tablet Take 1 Tablet by mouth once daily (morning) 04/14/23   Milford, Anderson Malta, FNP  atorvastatin (LIPITOR) 40 MG tablet take 1 Tablet by mouth in the morning (morning) 04/14/23   Milford, Anderson Malta, FNP  carboxymethylcellul-glycerin (LUBRICATING EYE DROPS) 0.5-0.9 % ophthalmic solution Place 1 drop into both eyes daily as needed (dry eyes). 12/01/22   Hoy Register, MD  carvedilol (COREG) 25 MG tablet Take 1 tablet (25 mg total) by mouth 2 (two) times daily. 11/12/22   Milford, Anderson Malta, FNP  dicyclomine (BENTYL) 10 MG capsule Take 1 capsule (10 mg total) by mouth 3 (three) times daily before meals. 04/05/23   Hoy Register, MD  famotidine (PEPCID) 40 MG tablet TAKE 1 Tablet BY MOUTH ONCE DAILY (evening) 05/13/23   Hoy Register, MD  hydrALAZINE (APRESOLINE) 25 MG tablet Take 1 tablet (25 mg total) by mouth 3 (three) times daily. 11/18/22 02/23/23  Jacklynn Ganong, FNP  hydrALAZINE (APRESOLINE) 50 MG tablet Take 1 tablet (50 mg total) by mouth 3 (three) times daily. 11/18/22   Jacklynn Ganong, FNP  JARDIANCE 10 MG TABS tablet Take 1 Tablet by mouth once daily (morning) 04/14/23   Milford, Anderson Malta, FNP  oxybutynin (DITROPAN) 5 MG tablet Take 1 tablet (5 mg total) by mouth 2 (two) times daily. 11/30/22   Hoy Register, MD  oxyCODONE (OXY IR/ROXICODONE) 5 MG immediate release tablet Take 1 tablet (5 mg total) by mouth every 6 (six) hours as needed for severe pain. 12/02/22   Cornett, Maisie Fus, MD  pregabalin (LYRICA) 75 MG capsule Take 1 capsule (75 mg total) by mouth 2 (two) times daily. 12/01/22   Hoy Register, MD  pregabalin (LYRICA) 75 MG capsule TAKE ONE CAPSULE BY MOUTH TWICE DAILY (morning, evening) 05/13/23   Newlin, Enobong, MD  Semaglutide, 1 MG/DOSE, (OZEMPIC, 1 MG/DOSE,) 4 MG/3ML SOPN INJECT 1 MG UNDER THE SKIN ONCE a WEEK 05/13/23   Hoy Register, MD  spironolactone (ALDACTONE) 25 MG tablet Take 0.5 tablets (12.5 mg total) by mouth daily. 11/12/22   Jacklynn Ganong, FNP  torsemide (DEMADEX) 20 MG tablet Take 1 tablet (20 mg total) by mouth daily. 11/12/22   Jacklynn Ganong, FNP      Allergies    Lisinopril  Review of Systems   Review of Systems  Physical Exam Updated Vital Signs BP (!) 157/82 (BP Location: Left Arm)   Pulse 72   Temp (!) 97.3 F (36.3 C) (Oral)   Resp 18   Ht 5\' 4"  (1.626 m)   Wt 94.8 kg   LMP 04/01/2010   SpO2 93%   BMI 35.87 kg/m   Physical Exam Vitals and nursing note reviewed.  Constitutional:      Appearance: She is well-developed.  HENT:     Head: Normocephalic and atraumatic.     Jaw: No trismus.     Right Ear: External ear normal.     Left Ear: External ear normal.     Nose: Nose normal. No mucosal edema or rhinorrhea.     Mouth/Throat:      Mouth: Mucous membranes are moist. Mucous membranes are not dry. No oral lesions.     Pharynx: Uvula midline. No oropharyngeal exudate, posterior oropharyngeal erythema or uvula swelling.     Tonsils: No tonsillar abscesses.  Eyes:     General:        Right eye: No discharge.        Left eye: No discharge.     Conjunctiva/sclera: Conjunctivae normal.  Cardiovascular:     Rate and Rhythm: Normal rate and regular rhythm.     Heart sounds: Normal heart sounds.  Pulmonary:     Effort: Pulmonary effort is normal. No respiratory distress.     Breath sounds: Wheezing present. No rales.     Comments: Patient with mild expiratory wheezing scattered throughout the lung fields, occasional forceful cough during exam.  No crackles or rales. Abdominal:     Palpations: Abdomen is soft.     Tenderness: There is no abdominal tenderness.  Musculoskeletal:     Cervical back: Normal range of motion and neck supple.     Right lower leg: No edema.     Left lower leg: No edema.     Comments: No lower extremity edema  Lymphadenopathy:     Cervical: No cervical adenopathy.  Skin:    General: Skin is warm and dry.  Neurological:     Mental Status: She is alert.  Psychiatric:        Mood and Affect: Mood normal.     ED Results / Procedures / Treatments   Labs (all labs ordered are listed, but only abnormal results are displayed) Labs Reviewed  RESP PANEL BY RT-PCR (RSV, FLU A&B, COVID)  [id number]    EKG None  Radiology DG Chest 2 View  Result Date: 06/04/2023 CLINICAL DATA:  62 year old female with history of coughing congestion. EXAM: CHEST - 2 VIEW COMPARISON:  Chest x-ray 01/20/2023. FINDINGS: Lung volumes are normal. No consolidative airspace disease. No pleural effusions. No pneumothorax. No pulmonary nodule or mass noted. Pulmonary vasculature and the cardiomediastinal silhouette are within normal limits. IMPRESSION: No radiographic evidence of acute cardiopulmonary disease. Electronically  Signed   By: Trudie Reed M.D.   On: 06/04/2023 07:08    Procedures Procedures    Medications Ordered in ED Medications  albuterol (PROVENTIL) (2.5 MG/3ML) 0.083% nebulizer solution 5 mg (5 mg Nebulization Given 06/04/23 0808)  ipratropium (ATROVENT) nebulizer solution 0.5 mg (0.5 mg Nebulization Given 06/04/23 0810)    ED Course/ Medical Decision Making/ A&P    Patient seen and examined. History obtained directly from patient. Work-up including labs, imaging, EKG ordered in triage, if performed, were reviewed.    Labs/EKG: I have ordered COVID  and flu testing  Imaging: Independently visualized and interpreted.  This included: Chest x-ray, agree no signs of infiltrate or failure.  Medications/Fluids: Given wheezing, I have ordered albuterol and Atrovent nebulizer treatment.  Most recent vital signs reviewed and are as follows: BP (!) 157/82 (BP Location: Left Arm)   Pulse 72   Temp (!) 97.3 F (36.3 C) (Oral)   Resp 18   Ht 5\' 4"  (1.626 m)   Wt 94.8 kg   LMP 04/01/2010   SpO2 93%   BMI 35.87 kg/m   Initial impression: Respiratory infection, low concern for pneumonia.  Patient does not appear to be clinically hypervolemic.  No lower extremity swelling.  X-ray not consistent with heart failure.  After discussion with patient, will defer additional evaluation for heart failure at this time given that this seems more like a viral syndrome.  Patient is in agreement.  9:40 AM Reassessment performed. Patient appears stable.  Reports some improvement from nebulizer treatment.  She is requesting discharge.  Labs personally reviewed and interpreted including: Respiratory panel negative.  Reviewed pertinent lab work and imaging with patient at bedside. Questions answered.   Most current vital signs reviewed and are as follows: BP (!) 157/82 (BP Location: Left Arm)   Pulse 72   Temp (!) 97.3 F (36.3 C) (Oral)   Resp 18   Ht 5\' 4"  (1.626 m)   Wt 94.8 kg   LMP 04/01/2010    SpO2 93%   BMI 35.87 kg/m   Plan: Discharge to home.   Prescriptions written for: Promethazine cough syrup, Tessalon.  Patient has an albuterol inhaler at home and is encouraged to use 2 puffs every 4 hours as needed for cough and chest tightness.  Other home care instructions discussed: Other over-the-counter medications as needed  ED return instructions discussed: Worsening shortness of breath, increased work of breathing, chest pain, fever, new or worsening symptoms  Follow-up instructions discussed: Patient encouraged to follow-up with their PCP in 3 days if not improving.                                  Medical Decision Making Amount and/or Complexity of Data Reviewed Radiology: ordered.  Risk Prescription drug management.   Patient presents with cough and chest congestion, likely infectious.  Fortunately, flu and COVID testing was negative.  Chest x-ray did not demonstrate infiltrate or signs of pneumonia.  Patient does have a history of diastolic heart failure, however does not appear to be fluid overloaded today.  No orthopnea, no lower extremity edema.  Overall low concern for decompensated heart failure causing cough.  No chest pain or abnormal vital signs to suggest ACS or PE today.  The patient's vital signs, pertinent lab work and imaging were reviewed and interpreted as discussed in the ED course. Hospitalization was considered for further testing, treatments, or serial exams/observation. However as patient is well-appearing, has a stable exam, and reassuring studies today, I do not feel that they warrant admission at this time. This plan was discussed with the patient who verbalizes agreement and comfort with this plan and seems reliable and able to return to the Emergency Department with worsening or changing symptoms.           Final Clinical Impression(s) / ED Diagnoses Final diagnoses:  Viral URI with cough    Rx / DC Orders ED Discharge Orders  Ordered    promethazine-dextromethorphan (PROMETHAZINE-DM) 6.25-15 MG/5ML syrup  4 times daily PRN        06/04/23 0938    benzonatate (TESSALON) 100 MG capsule  Every 8 hours        06/04/23 0938              Renne Crigler, PA-C 06/04/23 1610    Terrilee Files, MD 06/04/23 [phone]

## 2023-06-04 NOTE — ED Triage Notes (Signed)
C/o deep cough onset Monday , states her chest hurts when she coughs

## 2023-06-15 ENCOUNTER — Other Ambulatory Visit (HOSPITAL_COMMUNITY): Payer: Self-pay | Admitting: Family Medicine

## 2023-06-15 ENCOUNTER — Other Ambulatory Visit: Payer: Self-pay | Admitting: Family Medicine

## 2023-06-15 DIAGNOSIS — N3946 Mixed incontinence: Secondary | ICD-10-CM

## 2023-06-15 NOTE — Telephone Encounter (Signed)
Please schedule an appointment for further refills

## 2023-07-01 ENCOUNTER — Encounter: Payer: Self-pay | Admitting: Family Medicine

## 2023-07-05 ENCOUNTER — Ambulatory Visit: Payer: 59 | Attending: Family Medicine | Admitting: Family Medicine

## 2023-07-05 ENCOUNTER — Encounter: Payer: Self-pay | Admitting: Family Medicine

## 2023-07-05 VITALS — BP 178/93 | HR 74 | Ht 64.0 in | Wt 214.2 lb

## 2023-07-05 DIAGNOSIS — K529 Noninfective gastroenteritis and colitis, unspecified: Secondary | ICD-10-CM | POA: Diagnosis not present

## 2023-07-05 DIAGNOSIS — Z794 Long term (current) use of insulin: Secondary | ICD-10-CM

## 2023-07-05 DIAGNOSIS — E0500 Thyrotoxicosis with diffuse goiter without thyrotoxic crisis or storm: Secondary | ICD-10-CM | POA: Diagnosis not present

## 2023-07-05 DIAGNOSIS — I110 Hypertensive heart disease with heart failure: Secondary | ICD-10-CM | POA: Diagnosis not present

## 2023-07-05 DIAGNOSIS — Z7984 Long term (current) use of oral hypoglycemic drugs: Secondary | ICD-10-CM

## 2023-07-05 DIAGNOSIS — I129 Hypertensive chronic kidney disease with stage 1 through stage 4 chronic kidney disease, or unspecified chronic kidney disease: Secondary | ICD-10-CM | POA: Diagnosis not present

## 2023-07-05 DIAGNOSIS — L84 Corns and callosities: Secondary | ICD-10-CM

## 2023-07-05 DIAGNOSIS — E1122 Type 2 diabetes mellitus with diabetic chronic kidney disease: Secondary | ICD-10-CM | POA: Diagnosis not present

## 2023-07-05 DIAGNOSIS — Z7985 Long-term (current) use of injectable non-insulin antidiabetic drugs: Secondary | ICD-10-CM | POA: Diagnosis not present

## 2023-07-05 DIAGNOSIS — I5032 Chronic diastolic (congestive) heart failure: Secondary | ICD-10-CM | POA: Diagnosis not present

## 2023-07-05 DIAGNOSIS — N3946 Mixed incontinence: Secondary | ICD-10-CM

## 2023-07-05 DIAGNOSIS — E1149 Type 2 diabetes mellitus with other diabetic neurological complication: Secondary | ICD-10-CM | POA: Diagnosis not present

## 2023-07-05 LAB — POCT GLYCOSYLATED HEMOGLOBIN (HGB A1C): HbA1c, POC (controlled diabetic range): 6.6 % (ref 0.0–7.0)

## 2023-07-05 MED ORDER — PREGABALIN 75 MG PO CAPS: 75.0000 mg | ORAL_CAPSULE | Freq: Two times a day (BID) | ORAL | 1 refills | Status: DC

## 2023-07-05 MED ORDER — OXYBUTYNIN CHLORIDE 5 MG PO TABS: 5.0000 mg | ORAL_TABLET | Freq: Two times a day (BID) | ORAL | 1 refills | Status: DC

## 2023-07-05 MED ORDER — OZEMPIC (1 MG/DOSE) 4 MG/3ML ~~LOC~~ SOPN: 1.0000 mg | PEN_INJECTOR | SUBCUTANEOUS | 1 refills | Status: DC

## 2023-07-05 NOTE — Progress Notes (Signed)
Subjective:  Patient ID: [name] [name], female    DOB: [birth year]  Age: 62 y.o. MRN: [id number]  CC: Medical Management of Chronic Issues (diarrhea)   HPI ARNETA MAGO is a 62 y.o. year old female with a history of  type 2 diabetes mellitus (A1c 6.3), diabetic neuropathy, hypertension, diabetic retinopathy, hyperlipidemia, hepatitis C (completed treatment with harvoni), OSA, diastolic CHF (EF 60 to 65% from echo of 09/2021), stage III CKD, hyperthyroidism.     Interval History: Discussed the use of AI scribe software for clinical note transcription with the patient, who gave verbal consent to proceed.  She presents with persistent diarrhea that is not responding to current treatment. The diarrhea is intermittent and seems to be exacerbated by certain foods, including milk, greens, and ice cream. The patient reports having to wear adult diapers at night due to incontinence. I placed her on dicyclomine which has been ineffective.  I referred her to GI at her last visit 3 months ago and notes in her chart indicated tried to reach her but were unsuccessful.  The patient also discusses her disappointment over her insurance not covering a desired breast reduction surgery. She expresses feeling depressed over this and is unsure about her insurance details.   The patient's hyperthyroidism appears to be under control without medications, with a normal TSH level at her last visit in September with [facility]. Her diabetes is also well-managed, with an A1c of 6.6. However, her blood pressure is high, and she admits to not taking her medication on the day of the visit due to rushing.  The patient also mentions a foot issue, with a toe curving in a different direction. She last saw a foot doctor last year and expresses a desire to see one again.   From a cardiac standpoint she has no dyspnea or chest pain or pedal edema.    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   Qualifier: Diagnosis of  By: Huntley Dec, Scott     Hypertension    MRSA (methicillin resistant Staphylococcus aureus)    Trichomonas    VAGINITIS, BACTERIAL, RECURRENT 08/15/2007   Qualifier: Diagnosis of  By: Barbaraann Barthel MD, [place]      Past Surgical History:  Procedure Laterality Date   BREAST BIOPSY Left 2018   BREAST BIOPSY Left 06/08/2022   BREAST BIOPSY Right 06/08/2022   BREAST BIOPSY Right 06/08/2022   [place] RT BREAST BX W LOC DEV 1ST LESION IMG BX SPEC US GUIDE 06/08/2022 [facility]   BREAST BIOPSY Left 06/08/2022   [place] LT BREAST BX W LOC DEV 1ST LESION IMG BX SPEC US GUIDE 06/08/2022 [facility]   BREAST BIOPSY  12/01/2022   MM LT RADIOACTIVE SEED LOC MAMMO GUIDE 12/01/2022 [facility]   BREAST LUMPECTOMY WITH RADIOACTIVE SEED LOCALIZATION Left 12/02/2022   Procedure: LEFT BREAST LUMPECTOMY WITH RADIOACTIVE SEED LOCALIZATION;  Surgeon: Harriette Bouillon, MD;  Location: [facility] OR;  Service: General;  Laterality: Left;   CESAREAN SECTION     breech    Family History  Problem Relation Age of Onset   Colon cancer Mother    Liver disease Sister    Other Neg Hx    Breast cancer Neg Hx    Esophageal cancer Neg Hx    Rectal cancer Neg  Hx    Tremor Neg Hx     Social History   Socioeconomic History   Marital status: Legally Separated    Spouse name: Not on file   Number of children: 1   Years of education: Not on file   Highest education level: 9th grade  Occupational History   Occupation: disability  Tobacco Use   Smoking status: Never   Smokeless tobacco: Never  Vaping Use   Vaping status: Never Used  Substance and Sexual Activity   Alcohol use: No   Drug use: Not Currently    Types: Cocaine, Marijuana    Comment: remote h/o cocaine 2015 and marijuana 2018   Sexual activity: Not Currently  Other Topics Concern    Not on file  Social History Narrative   Lives with friend, [name] and nephew.  One daughter.    Social Drivers of Corporate investment banker Strain: Low Risk  (07/22/2022)   Overall Financial Resource Strain (CARDIA)    Difficulty of Paying Living Expenses: Not very hard  Food Insecurity: No Food Insecurity (02/23/2023)   Hunger Vital Sign    Worried About Running Out of Food in the Last Year: Never true    Ran Out of Food in the Last Year: Never true  Transportation Needs: No Transportation Needs (02/23/2023)   PRAPARE - Administrator, Civil Service (Medical): No    Lack of Transportation (Non-Medical): No  Physical Activity: Inactive (02/23/2023)   Exercise Vital Sign    Days of Exercise per Week: 0 days    Minutes of Exercise per Session: 0 min  Stress: No Stress Concern Present (02/23/2023)   [employer] of Occupational Health - Occupational Stress Questionnaire    Feeling of Stress : Not at all  Social Connections: Moderately Isolated (02/23/2023)   Social Connection and Isolation Panel [NHANES]    Frequency of Communication with Friends and Family: More than three times a week    Frequency of Social Gatherings with Friends and Family: Twice a week    Attends Religious Services: More than 4 times per year    Active Member of [employer] or Organizations: No    Attends Banker Meetings: Never    Marital Status: Divorced    Allergies  Allergen Reactions   Lisinopril Swelling    Outpatient Medications Prior to Visit  Medication Sig Dispense Refill   albuterol (VENTOLIN HFA) 108 (90 Base) MCG/ACT inhaler Inhale 2 puffs into the lungs every 4 (four) hours as needed for wheezing or shortness of breath. 1 each 0   amLODipine (NORVASC) 2.5 MG tablet Take 1 Tablet by mouth once daily (morning) 90 tablet 1   atorvastatin (LIPITOR) 40 MG tablet take 1 Tablet by mouth in the morning (morning) 90 tablet 1   benzonatate (TESSALON) 100 MG capsule Take 1 capsule (100 mg  total) by mouth every 8 (eight) hours. 15 capsule 0   carboxymethylcellul-glycerin (LUBRICATING EYE DROPS) 0.5-0.9 % ophthalmic solution Place 1 drop into both eyes daily as needed (dry eyes). 10 mL 0   carvedilol (COREG) 25 MG tablet Take 1 tablet (25 mg total) by mouth 2 (two) times daily. 180 tablet 3   dicyclomine (BENTYL) 10 MG capsule Take 1 capsule (10 mg total) by mouth 3 (three) times daily before meals. 90 capsule 3   famotidine (PEPCID) 40 MG tablet TAKE 1 Tablet BY MOUTH ONCE DAILY (evening) 90 tablet 1   hydrALAZINE (APRESOLINE) 50 MG tablet Take 1 tablet (50  mg total) by mouth 3 (three) times daily. 135 tablet 8   JARDIANCE 10 MG TABS tablet Take 1 Tablet by mouth once daily (morning) 90 tablet 1   oxyCODONE (OXY IR/ROXICODONE) 5 MG immediate release tablet Take 1 tablet (5 mg total) by mouth every 6 (six) hours as needed for severe pain. 15 tablet 0   promethazine-dextromethorphan (PROMETHAZINE-DM) 6.25-15 MG/5ML syrup Take 5 mLs by mouth 4 (four) times daily as needed for cough. 118 mL 0   spironolactone (ALDACTONE) 25 MG tablet Take 0.5 tablets (12.5 mg total) by mouth daily. 30 tablet 0   torsemide (DEMADEX) 20 MG tablet Take 1 tablet (20 mg total) by mouth daily. 90 tablet 6   oxybutynin (DITROPAN) 5 MG tablet TAKE ONE TABLET BY MOUTH TWICE DAILY (morning, evening) 60 tablet 0   pregabalin (LYRICA) 75 MG capsule Take 1 capsule (75 mg total) by mouth 2 (two) times daily. 180 capsule 1   pregabalin (LYRICA) 75 MG capsule TAKE ONE CAPSULE BY MOUTH TWICE DAILY (morning, evening) 180 capsule 1   Semaglutide, 1 MG/DOSE, (OZEMPIC, 1 MG/DOSE,) 4 MG/3ML SOPN INJECT 1 MG UNDER THE SKIN ONCE a WEEK 9 mL 1   hydrALAZINE (APRESOLINE) 25 MG tablet Take 1 tablet (25 mg total) by mouth 3 (three) times daily. 270 tablet 3   No facility-administered medications prior to visit.     ROS Review of Systems  Constitutional:  Negative for activity change and appetite change.  HENT:  Negative for  sinus pressure and sore throat.   Respiratory:  Negative for chest tightness, shortness of breath and wheezing.   Cardiovascular:  Negative for chest pain and palpitations.  Gastrointestinal:  Positive for diarrhea. Negative for abdominal distention, abdominal pain and constipation.  Genitourinary: Negative.   Musculoskeletal: Negative.   Psychiatric/Behavioral:  Negative for behavioral problems and dysphoric mood.     Objective:  BP (!) 178/93   Pulse 74   Ht 5\' 4"  (1.626 m)   Wt 214 lb 3.2 oz (97.2 kg)   LMP 04/01/2010   SpO2 97%   BMI 36.77 kg/m      07/05/2023   11:53 AM 07/05/2023   11:39 AM 07/05/2023   11:05 AM  BP/Weight  Systolic BP 178 192 173  Diastolic BP 93 91 90  Wt. (Lbs)   214.2  BMI   36.77 kg/m2      Physical Exam Constitutional:      Appearance: She is well-developed.  Cardiovascular:     Rate and Rhythm: Normal rate.     Heart sounds: Normal heart sounds. No murmur heard. Pulmonary:     Effort: Pulmonary effort is normal.     Breath sounds: Normal breath sounds. No wheezing or rales.  Chest:     Chest wall: No tenderness.  Abdominal:     General: Bowel sounds are normal. There is no distension.     Palpations: Abdomen is soft. There is no mass.     Tenderness: There is no abdominal tenderness.  Musculoskeletal:        General: Normal range of motion.     Right lower leg: No edema.     Left lower leg: No edema.  Neurological:     Mental Status: She is alert and oriented to person, place, and time.  Psychiatric:        Mood and Affect: Mood normal.    Diabetic Foot Exam - Simple   Simple Foot Form Diabetic Foot exam was performed with the following  findings: Yes 07/05/2023 11:29 AM  Visual Inspection See comments: Yes Sensation Testing Intact to touch and monofilament testing bilaterally: Yes Pulse Check Posterior Tibialis and Dorsalis pulse intact bilaterally: Yes Comments Long toenail on left great toe.  Callus on medial aspect  of left toe.  No ulceration        Latest Ref Rng & Units 01/20/2023    7:58 AM 12/02/2022    6:13 AM 11/10/2022   10:58 AM  CMP  Glucose 70 - 99 mg/dL 161  096  045   BUN 8 - 23 mg/dL 35  32  36   Creatinine 0.44 - 1.00 mg/dL 4.09  8.11  9.14   Sodium 135 - 145 mmol/L 139  138  140   Potassium 3.5 - 5.1 mmol/L 3.4  3.5  3.9   Chloride 98 - 111 mmol/L 103  106  109   CO2 22 - 32 mmol/L 22  23  22    Calcium 8.9 - 10.3 mg/dL 8.9  8.5  9.2   Total Protein 6.5 - 8.1 g/dL  6.7    Total Bilirubin 0.3 - 1.2 mg/dL  0.7    Alkaline Phos 38 - 126 U/L  86    AST 15 - 41 U/L  15    ALT 0 - 44 U/L  13      Lipid Panel     Component Value Date/Time   CHOL 148 09/06/2022 1135   TRIG 77 09/06/2022 1135   HDL 78 09/06/2022 1135   CHOLHDL 2.5 01/29/2019 1118   CHOLHDL 3 06/18/2014 0951   VLDL 19.6 06/18/2014 0951   LDLCALC 55 09/06/2022 1135    CBC    Component Value Date/Time   WBC 3.7 (L) 01/20/2023 0758   RBC 3.53 (L) 01/20/2023 0758   HGB 10.3 (L) 01/20/2023 0758   HGB 9.8 (L) 07/10/2019 1544   HCT 32.7 (L) 01/20/2023 0758   HCT 30.9 (L) 07/10/2019 1544   PLT 127 (L) 01/20/2023 0758   PLT 171 07/10/2019 1544   MCV 92.6 01/20/2023 0758   MCV 86 07/10/2019 1544   MCH 29.2 01/20/2023 0758   MCHC 31.5 01/20/2023 0758   RDW 13.0 01/20/2023 0758   RDW 12.7 07/10/2019 1544   LYMPHSABS 1.0 09/22/2021 1209   LYMPHSABS 1.3 07/10/2019 1544   MONOABS 0.4 09/22/2021 1209   EOSABS 0.0 09/22/2021 1209   EOSABS 0.0 07/10/2019 1544   BASOSABS 0.0 09/22/2021 1209   BASOSABS 0.0 07/10/2019 1544    Lab Results  Component Value Date   HGBA1C 6.6 07/05/2023   Lab Results  Component Value Date   TSH <0.005 (L) 07/05/2022     Assessment & Plan:      Chronic Diarrhea Persistent despite medication. Certain foods exacerbate symptoms. Incontinence at night. Previous referral to GI specialist not followed up. -Call GI specialist and schedule appointment. -Continue  dicyclomine  Breast Reduction Insurance coverage denied. Patient expressed distress and disappointment. -Consult with social services regarding insurance options. -Consider discussing with daughter for additional support.  Graves' disease Last TSH was normal.  -Last seen by [facility] in 03/2023 -Schedule follow-up appointment with endocrinologist.  Hypertension Blood pressure elevated during visit. Patient did not take medication prior to visit. -Advise patient to take medication consistently. -Schedule follow-up in 1 month to monitor blood pressure.  Foot Care Calluses noted on feet. No recent visit with podiatrist. -Refer to podiatrist for foot care.     Hypertensive heart disease Euvolemic with EF of  60 to 65% -Continue SGLT2i, beta-blocker, hydralazine, spironolactone     Meds ordered this encounter  Medications   oxybutynin (DITROPAN) 5 MG tablet    Sig: Take 1 tablet (5 mg total) by mouth 2 (two) times daily.    Dispense:  180 tablet    Refill:  1   pregabalin (LYRICA) 75 MG capsule    Sig: Take 1 capsule (75 mg total) by mouth 2 (two) times daily.    Dispense:  180 capsule    Refill:  1    This prescription was filled on 05/12/2023. Any refills authorized will be placed on file.   Semaglutide, 1 MG/DOSE, (OZEMPIC, 1 MG/DOSE,) 4 MG/3ML SOPN    Sig: Inject 1 mg into the skin once a week.    Dispense:  9 mL    Refill:  1    Follow-up: Return in about 1 month (around 08/05/2023) for Blood Pressure follow-up with PCP.       Hoy Register, MD, FAAFP. [facility] [place], [place] [phone]   07/05/2023, 12:24 PM

## 2023-07-05 NOTE — Patient Instructions (Addendum)
Please call GI to schedule: [facility] [address] [place], [place] [address] PH# [phone]

## 2023-07-06 LAB — CMP14+EGFR
ALT: 8 [IU]/L (ref 0–32)
AST: 13 [IU]/L (ref 0–40)
Albumin: 4.3 g/dL (ref 3.9–4.9)
Alkaline Phosphatase: 99 [IU]/L (ref 44–121)
BUN/Creatinine Ratio: 14 (ref 12–28)
BUN: 23 mg/dL (ref 8–27)
Bilirubin Total: 0.5 mg/dL (ref 0.0–1.2)
CO2: 18 mmol/L — ABNORMAL LOW (ref 20–29)
Calcium: 9 mg/dL (ref 8.7–10.3)
Chloride: 108 mmol/L — ABNORMAL HIGH (ref 96–106)
Creatinine, Ser: 1.63 mg/dL — ABNORMAL HIGH (ref 0.57–1.00)
Globulin, Total: 2.9 g/dL (ref 1.5–4.5)
Glucose: 107 mg/dL — ABNORMAL HIGH (ref 70–99)
Potassium: 3.8 mmol/L (ref 3.5–5.2)
Sodium: 143 mmol/L (ref 134–144)
Total Protein: 7.2 g/dL (ref 6.0–8.5)
eGFR: 35 mL/min/{1.73_m2} — ABNORMAL LOW (ref >59)

## 2023-07-11 ENCOUNTER — Encounter: Payer: Self-pay | Admitting: Internal Medicine

## 2023-07-21 ENCOUNTER — Encounter: Payer: Self-pay | Admitting: Podiatry

## 2023-07-21 ENCOUNTER — Ambulatory Visit (INDEPENDENT_AMBULATORY_CARE_PROVIDER_SITE_OTHER): Payer: 59 | Admitting: Podiatry

## 2023-07-21 DIAGNOSIS — B351 Tinea unguium: Secondary | ICD-10-CM | POA: Diagnosis not present

## 2023-07-21 DIAGNOSIS — E0842 Diabetes mellitus due to underlying condition with diabetic polyneuropathy: Secondary | ICD-10-CM | POA: Diagnosis not present

## 2023-07-21 DIAGNOSIS — L84 Corns and callosities: Secondary | ICD-10-CM

## 2023-07-21 DIAGNOSIS — M79675 Pain in left toe(s): Secondary | ICD-10-CM

## 2023-07-21 DIAGNOSIS — M79674 Pain in right toe(s): Secondary | ICD-10-CM

## 2023-07-21 NOTE — Progress Notes (Signed)
  Subjective:  Patient ID: [name] [name], female    DOB: May 31, 1961,  MRN: [id number]  Chief Complaint  Patient presents with   [facility]    She is here for [facility] and a nail trim, She has noticed that the left big toe is turning and wants to know is there is something she can do for the toe turning.     63 y.o. female presents with the above complaint. History confirmed with patient. Patient presenting with pain related to dystrophic thickened elongated nails. Patient is unable to trim own nails related to nail dystrophy and/or mobility issues. Patient does  have a history of T2DM.  Last A1c in the low sixes per patient.  Patient does  have callus present located at the left subfirst metatarsal head causing pain presenting as fissure.   Objective:  Physical Exam: warm, good capillary refill, decreased pedal hair growth. nail exam onychomycosis of the toenails, onycholysis, and dystrophic nails, loosening of left hallux nail with some nail plate separation. DP pulses palpable, PT pulses palpable, protective sensation intact, and vibratory sensation diminished Left Foot:  Pain with palpation of nails due to elongation and dystrophic growth.  Preulcerative lesion with fissure left subfirst metatarsal head Right Foot: Pain with palpation of nails due to elongation and dystrophic growth.   Assessment:   1. Diabetic polyneuropathy associated with diabetes mellitus due to underlying condition (HCC)   2. Pain due to onychomycosis of toenails of both feet      Plan:  Patient was evaluated and treated and all questions answered.  #Hyperkeratotic lesions/pre ulcerative calluses present left foot subfirst metatarsal head All symptomatic hyperkeratoses x 1  safely debrided with a sterile #312 blade to patient's level of comfort without incident. We discussed preventative and palliative care of these lesions including supportive and accommodative shoegear, padding, prefabricated and custom molded  accommodative orthoses, use of a pumice stone and lotions/creams daily.  #Onychomycosis with pain  -Nails palliatively debrided as below. -Educated on self-care -Patient is concerned about the growth of the left hallux nail.  Did discuss removal of the toenail.  She would like to proceed with this at a later date.  May follow-up for this prior to her next appointment for diabetic footcare.  Procedure: Nail Debridement Rationale: Pain Type of Debridement: manual, sharp debridement. Instrumentation: Nail nipper, rotary burr. Number of Nails: 10  Return in about 3 months (around 10/19/2023) for Diabetic Foot Care.         Ethan Saddler, DPM [facility] / [facility]

## 2023-08-08 ENCOUNTER — Encounter (HOSPITAL_COMMUNITY): Payer: Self-pay | Admitting: *Deleted

## 2023-08-08 ENCOUNTER — Ambulatory Visit (HOSPITAL_COMMUNITY)
Admission: EM | Admit: 2023-08-08 | Discharge: 2023-08-08 | Disposition: A | Discharge status: Home / Self Care | Payer: 59 | Attending: Family Medicine | Admitting: Family Medicine

## 2023-08-08 ENCOUNTER — Ambulatory Visit (INDEPENDENT_AMBULATORY_CARE_PROVIDER_SITE_OTHER): Payer: 59

## 2023-08-08 DIAGNOSIS — S62328A Displaced fracture of shaft of other metacarpal bone, initial encounter for closed fracture: Secondary | ICD-10-CM

## 2023-08-08 DIAGNOSIS — S62326A Displaced fracture of shaft of fifth metacarpal bone, right hand, initial encounter for closed fracture: Secondary | ICD-10-CM | POA: Diagnosis not present

## 2023-08-08 MED ORDER — ACETAMINOPHEN 325 MG PO TABS
ORAL_TABLET | ORAL | Status: AC
  Filled 2023-08-08: qty 3

## 2023-08-08 MED ORDER — TRAMADOL HCL 50 MG PO TABS: 100.0000 mg | ORAL_TABLET | Freq: Three times a day (TID) | ORAL | 0 refills | Status: AC | PRN

## 2023-08-08 MED ORDER — ACETAMINOPHEN 325 MG PO TABS
975.0000 mg | ORAL_TABLET | Freq: Once | ORAL | Status: AC
  Administered 2023-08-08: 975 mg via ORAL

## 2023-08-08 NOTE — ED Provider Notes (Signed)
[facility]    CSN: [id number] Arrival date & time: 08/08/23  0801      History   Chief Complaint Chief Complaint  Patient presents with   Hand Injury    HPI [name] [name] is a 63 y.o. female.  Patient here for evaluation of right hand injury after horse playing with her boyfriend and hit his knee with her right hand. Today she complains of worsening right hand pain with swelling. Denies numbness or tingling.  Patient reports that the greatest amount of pain is at the base of her fourth and fifth fingers.  She has not taken any medication for pain.  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, with long-term current use of insulin, unspecified retinopathy severity (HCC) 09/06/2022   Graves disease 04/23/2022   Heart failure (HCC) 09/22/2021   CKD (chronic kidney disease) stage 4, GFR 15-29 ml/min (HCC) 07/03/2021   COVID-19 05/05/2021   CHF exacerbation (HCC) 04/02/2021   Chest pain 04/02/2021   Acute-on-chronic kidney injury (HCC) 04/02/2021   COPD exacerbation (HCC) 12/05/2020   CKD (chronic kidney disease), stage III (HCC) 12/04/2020   Anemia of chronic disease 12/04/2020   Obesity, Class III, BMI 40-49.9 (morbid obesity) (HCC) 07/27/2020   Acute respiratory disease due to COVID-19 virus 07/25/2020   Morbid obesity (HCC) 11/23/2019   Chronic kidney disease, stage 3b (HCC) 11/06/2019   COPD without exacerbation (HCC)    Acute  kidney failure (HCC) 08/18/2019   Left ventricular hypertrophy 07/21/2019   Educated about COVID-19 virus infection 07/21/2019   Acute diastolic HF (heart failure) (HCC)    Hypoxia    OSA (obstructive sleep apnea) 05/09/2019   Hyperglycemia 04/09/2019   Snoring 03/13/2019   Chronic anemia 08/17/2018   Chronic cystitis 08/03/2018   Diabetic neuropathy (HCC) 06/16/2017   Non compliance w medication regimen 04/12/2017   Family history of colon cancer in mother 11/17/2016   Dyspareunia in female 11/11/2016   Screen for colon cancer 11/11/2016   History of ovarian cyst 11/11/2016   Chronic hepatitis C without hepatic coma (HCC) 11/09/2016   Hyperlipidemia 10/07/2016   Type 2 diabetes mellitus with hyperlipidemia (HCC) 01/29/2014   Status post intraocular lens implant 11/08/2013   PCO (posterior capsular opacification) 09/28/2013   Pseudophakia 09/12/2013   GERD (gastroesophageal reflux disease) 09/06/2013   Hypertension 09/06/2013   ANEMIA, IRON DEFICIENCY 10/03/2009   Lipoma 06/18/2009   TINEA PEDIS 05/07/2009   SUBSTANCE ABUSE, MULTIPLE 02/22/2007   HCVD (hypertensive cardiovascular disease) 02/22/2007    Past Surgical History:  Procedure Laterality Date   BREAST BIOPSY Left 2018   BREAST BIOPSY Left 06/08/2022   BREAST BIOPSY Right 06/08/2022   BREAST BIOPSY Right 06/08/2022   [place] RT BREAST BX W LOC DEV 1ST LESION IMG BX SPEC US GUIDE 06/08/2022 [facility]   BREAST BIOPSY Left 06/08/2022   [place] LT BREAST  BX W LOC DEV 1ST LESION IMG BX SPEC US GUIDE 06/08/2022 [facility]   BREAST BIOPSY  12/01/2022   MM LT RADIOACTIVE SEED LOC MAMMO GUIDE 12/01/2022 [facility]   BREAST LUMPECTOMY WITH RADIOACTIVE SEED LOCALIZATION Left 12/02/2022   Procedure: LEFT BREAST LUMPECTOMY WITH RADIOACTIVE SEED LOCALIZATION;  Surgeon: Harriette Bouillon, MD;  Location: [facility];  Service: General;  Laterality: Left;   CESAREAN SECTION     breech    OB History     Gravida  1   Para   1   Term  1   Preterm      AB      Living  1      SAB      IAB      Ectopic      Multiple      Live Births  1            Home Medications    Prior to Admission medications   Medication Sig Start Date End Date Taking? Authorizing Provider  amLODipine (NORVASC) 2.5 MG tablet Take 1 Tablet by mouth once daily (morning) 04/14/23  Yes Milford, Anderson Malta, FNP  atorvastatin (LIPITOR) 40 MG tablet take 1 Tablet by mouth in the morning (morning) 04/14/23  Yes Milford, Anderson Malta, FNP  carvedilol (COREG) 25 MG tablet Take 1 tablet (25 mg total) by mouth 2 (two) times daily. 11/12/22  Yes Milford, Anderson Malta, FNP  hydrALAZINE (APRESOLINE) 50 MG tablet Take 1 tablet (50 mg total) by mouth 3 (three) times daily. 11/18/22  Yes Milford, Anderson Malta, FNP  pregabalin (LYRICA) 75 MG capsule Take 1 capsule (75 mg total) by mouth 2 (two) times daily. 07/05/23  Yes Newlin, Enobong, MD  Semaglutide, 1 MG/DOSE, (OZEMPIC, 1 MG/DOSE,) 4 MG/3ML SOPN Inject 1 mg into the skin once a week. 07/05/23  Yes Hoy Register, MD  traMADol (ULTRAM) 50 MG tablet Take 2 tablets (100 mg total) by mouth every 8 (eight) hours as needed for up to 3 days for severe pain (pain score 7-10). 08/08/23 08/11/23 Yes Bing Neighbors, NP  albuterol (VENTOLIN HFA) 108 (90 Base) MCG/ACT inhaler Inhale 2 puffs into the lungs every 4 (four) hours as needed for wheezing or shortness of breath. 12/01/22   Hoy Register, MD  benzonatate (TESSALON) 100 MG capsule Take 1 capsule (100 mg total) by mouth every 8 (eight) hours. 06/04/23   Renne Crigler, PA-C  carboxymethylcellul-glycerin (LUBRICATING EYE DROPS) 0.5-0.9 % ophthalmic solution Place 1 drop into both eyes daily as needed (dry eyes). 12/01/22   Hoy Register, MD  dicyclomine (BENTYL) 10 MG capsule Take 1 capsule (10 mg total) by mouth 3 (three) times daily before meals. 04/05/23   Hoy Register, MD  famotidine (PEPCID) 40 MG tablet TAKE 1 Tablet BY MOUTH ONCE DAILY (evening)  05/13/23   Hoy Register, MD  hydrALAZINE (APRESOLINE) 25 MG tablet Take 1 tablet (25 mg total) by mouth 3 (three) times daily. 11/18/22 02/23/23  Jacklynn Ganong, FNP  JARDIANCE 10 MG TABS tablet Take 1 Tablet by mouth once daily (morning) 04/14/23   Milford, Anderson Malta, FNP  oxybutynin (DITROPAN) 5 MG tablet Take 1 tablet (5 mg total) by mouth 2 (two) times daily. 07/05/23   Hoy Register, MD  promethazine-dextromethorphan (PROMETHAZINE-DM) 6.25-15 MG/5ML syrup Take 5 mLs by mouth 4 (four) times daily as needed for cough. 06/04/23   Renne Crigler, PA-C  spironolactone (ALDACTONE) 25 MG tablet Take 0.5 tablets (12.5 mg total) by mouth daily.  06/15/23   Jacklynn Ganong, FNP  torsemide (DEMADEX) 20 MG tablet Take 1 tablet (20 mg total) by mouth daily. 11/12/22   Milford, Anderson Malta, FNP    Family History Family History  Problem Relation Age of Onset   Colon cancer Mother    Liver disease Sister    Other Neg Hx    Breast cancer Neg Hx    Esophageal cancer Neg Hx    Rectal cancer Neg Hx    Tremor Neg Hx     Social History Social History   Tobacco Use   Smoking status: Never   Smokeless tobacco: Never  Vaping Use   Vaping status: Never Used  Substance Use Topics   Alcohol use: No   Drug use: Not Currently    Types: Cocaine, Marijuana    Comment: remote h/o cocaine 2015 and marijuana 2018     Allergies   Lisinopril   Review of Systems Review of Systems Pertinent negatives listed in HPI  Physical Exam Triage Vital Signs ED Triage Vitals  Encounter Vitals Group     BP 08/08/23 0818 (!) 142/83     Systolic BP Percentile --      Diastolic BP Percentile --      Pulse Rate 08/08/23 0818 78     Resp 08/08/23 0818 18     Temp 08/08/23 0818 97.6 F (36.4 C)     Temp Source 08/08/23 0818 Oral     SpO2 08/08/23 0818 95 %     Weight --      Height --      Head Circumference --      Peak Flow --      Pain Score 08/08/23 0816 8     Pain Loc --      Pain Education --       Exclude from Growth Chart --    No data found.  Updated Vital Signs BP (!) 142/83 (BP Location: Left Arm)   Pulse 78   Temp 97.6 F (36.4 C) (Oral)   Resp 18   LMP 04/01/2010   SpO2 95%   Visual Acuity Right Eye Distance:   Left Eye Distance:   Bilateral Distance:    Right Eye Near:   Left Eye Near:    Bilateral Near:     Physical Exam Constitutional:      Appearance: Normal appearance.  HENT:     Head: Normocephalic and atraumatic.  Cardiovascular:     Rate and Rhythm: Normal rate and regular rhythm.  Pulmonary:     Effort: Pulmonary effort is normal.     Breath sounds: Normal breath sounds.  Musculoskeletal:     Right hand: Swelling and tenderness present. Decreased range of motion. Normal capillary refill.     Left hand: Normal.     Cervical back: Normal range of motion and neck supple.  Skin:    Capillary Refill: Capillary refill takes less than 2 seconds.  Neurological:     General: No focal deficit present.     Mental Status: She is alert.      UC Treatments / Results  Labs (all labs ordered are listed, but only abnormal results are displayed) Labs Reviewed - No data to display  EKG   Radiology DG Hand Complete Right Result Date: 08/08/2023 CLINICAL DATA:  Pain post blunt trauma EXAM: RIGHT HAND - COMPLETE 3+ VIEW COMPARISON:  03/27/2006 FINDINGS: Oblique fracture across the proximal shaft fifth metacarpal, with 3 mm radial displacement of distal  fracture fragment, and several mm override. No definite intra-articular involvement. IMPRESSION: Mildly displaced and overriding fifth metacarpal fracture. Electronically Signed   By: Corlis Leak M.D.   On: 08/08/2023 08:57    Procedures Procedures (including critical care time)  Medications Ordered in UC Medications  acetaminophen (TYLENOL) tablet 975 mg (975 mg Oral Given 08/08/23 0907)    Initial Impression / Assessment and Plan / UC Course  I have reviewed the triage vital signs and the nursing  notes.  Pertinent labs & imaging results that were available during my care of the patient were reviewed by me and considered in my medical decision making (see chart for details).    Imaging significant for right displaced fracture of the proximal shaft of the fifth metacarpal finger, patient placed in ulnar gutter splint.  Patient advised to contact [facility] today to schedule a follow-up with a hand specialist within the next few days.  A short course of tramadol provided as patient has reduced renal functioning or pain management.  Patient encouraged to continue Tylenol for mild to moderate levels of pain.  Patient verbalized understanding and agreement with plan today. Final Clinical Impressions(s) / UC Diagnoses   Final diagnoses:  Closed fracture of shaft of fifth metacarpal bone, initial encounter     Discharge Instructions      Call [facility] today to schedule a follow-up with hand specialist as you will require further evaluation and treatment of finger injury.  Continue tylenol every 4-6 hours as needed.  Prescribed Tramadol 100 mg every 8 hours as needed for pain.        ED Prescriptions     Medication Sig Dispense Auth. Provider   traMADol (ULTRAM) 50 MG tablet Take 2 tablets (100 mg total) by mouth every 8 (eight) hours as needed for up to 3 days for severe pain (pain score 7-10). 18 tablet Bing Neighbors, NP      I have reviewed the PDMP during this encounter.   Bing Neighbors, NP 08/08/23 [phone]

## 2023-08-08 NOTE — Discharge Instructions (Addendum)
Call [facility] today to schedule a follow-up with hand specialist as you will require further evaluation and treatment of finger injury.  Continue tylenol every 4-6 hours as needed.  Prescribed Tramadol 100 mg every 8 hours as needed for pain.

## 2023-08-08 NOTE — ED Notes (Signed)
Called ortho tech 

## 2023-08-08 NOTE — ED Notes (Signed)
Ortho Tech called by Laurel Dimmer., RN

## 2023-08-08 NOTE — ED Triage Notes (Signed)
Pt states she was playing with her boyfriend she hit his knee with her right hand. It is swollen and she can't move it since last night. She hasn't taken any meds.

## 2023-08-09 DIAGNOSIS — S62316A Displaced fracture of base of fifth metacarpal bone, right hand, initial encounter for closed fracture: Secondary | ICD-10-CM | POA: Diagnosis not present

## 2023-08-10 ENCOUNTER — Ambulatory Visit: Payer: 59 | Admitting: Family Medicine

## 2023-08-11 ENCOUNTER — Telehealth: Payer: Self-pay | Admitting: *Deleted

## 2023-08-11 ENCOUNTER — Other Ambulatory Visit: Payer: Self-pay

## 2023-08-11 ENCOUNTER — Encounter (HOSPITAL_BASED_OUTPATIENT_CLINIC_OR_DEPARTMENT_OTHER): Payer: Self-pay | Admitting: Orthopedic Surgery

## 2023-08-11 NOTE — Telephone Encounter (Signed)
Pt has been added on ok per Robin Searing, NP 08/15/23 per preop APP. Med rec and consent are done.

## 2023-08-11 NOTE — Telephone Encounter (Signed)
Pt has been added on ok per Robin Searing, NP 08/15/23 per preop APP. Med rec and consent are done.      Patient Consent for Virtual Visit        [name] [name] has provided verbal consent on 08/11/2023 for a virtual visit (video or telephone).   CONSENT FOR VIRTUAL VISIT FOR:  [name] [name]  By participating in this virtual visit I agree to the following:  I hereby voluntarily request, consent and authorize [facility] and its employed or contracted physicians, physician assistants, nurse practitioners or other licensed health care professionals (the Practitioner), to provide me with telemedicine health care services (the "Services") as deemed necessary by the treating Practitioner. I acknowledge and consent to receive the Services by the Practitioner via telemedicine. I understand that the telemedicine visit will involve communicating with the Practitioner through live audiovisual communication technology and the disclosure of certain medical information by electronic transmission. I acknowledge that I have been given the opportunity to request an in-person assessment or other available alternative prior to the telemedicine visit and am voluntarily participating in the telemedicine visit.  I understand that I have the right to withhold or withdraw my consent to the use of telemedicine in the course of my care at any time, without affecting my right to future care or treatment, and that the Practitioner or I may terminate the telemedicine visit at any time. I understand that I have the right to inspect all information obtained and/or recorded in the course of the telemedicine visit and may receive copies of available information for a reasonable fee.  I understand that some of the potential risks of receiving the Services via telemedicine include:  Delay or interruption in medical evaluation due to technological equipment failure or disruption; Information transmitted may not be  sufficient (e.g. poor resolution of images) to allow for appropriate medical decision making by the Practitioner; and/or  In rare instances, security protocols could fail, causing a breach of personal health information.  Furthermore, I acknowledge that it is my responsibility to provide information about my medical history, conditions and care that is complete and accurate to the best of my ability. I acknowledge that Practitioner's advice, recommendations, and/or decision may be based on factors not within their control, such as incomplete or inaccurate data provided by me or distortions of diagnostic images or specimens that may result from electronic transmissions. I understand that the practice of medicine is not an exact science and that Practitioner makes no warranties or guarantees regarding treatment outcomes. I acknowledge that a copy of this consent can be made available to me via my patient portal [facility] MyChart), or I can request a printed copy by calling the office of [facility].    I understand that my insurance will be billed for this visit.   I have read or had this consent read to me. I understand the contents of this consent, which adequately explains the benefits and risks of the Services being provided via telemedicine.  I have been provided ample opportunity to ask questions regarding this consent and the Services and have had my questions answered to my satisfaction. I give my informed consent for the services to be provided through the use of telemedicine in my medical care

## 2023-08-11 NOTE — Telephone Encounter (Signed)
   Pre-operative Risk Assessment    Patient Name: [name] [name]  DOB: 07/03/1961 MRN: [id number]   Date of last office visit: 10/27/22 JESSICA MILFORD Date of next office visit: NONE   Request for Surgical Clearance    Procedure:  ORIF RIGHT FIFTH METACARPAL Fx  Date of Surgery:  Clearance 08/17/23                                Surgeon:  DR. CHARLES BENFIELD Surgeon's Group or Practice Name:  [facility] Phone number:  [phone] ATTN: KERRI MAZE Fax number:  [phone]   Type of Clearance Requested:   - Medical ; NO BLOOD THINNERS NOTED TO BE HELD   Type of Anesthesia:  MAC WITH REGIONAL   Additional requests/questions:    Elpidio Anis   08/11/2023, 4:02 PM

## 2023-08-11 NOTE — Anesthesia Preprocedure Evaluation (Addendum)
Anesthesia Evaluation  Patient identified by MRN, date of birth, ID band Patient awake    Reviewed: Allergy & Precautions, NPO status , Patient's Chart, lab work & pertinent test results  History of Anesthesia Complications Negative for: history of anesthetic complications  Airway Mallampati: II  TM Distance: >3 FB Neck ROM: Full   Comment: Previous grade II view with MAC 4 Dental  (+) Dental Advisory Given, Missing   Pulmonary neg shortness of breath, sleep apnea , COPD (last used inhaler 1 month ago),  COPD inhaler, neg recent URI   Pulmonary exam normal breath sounds clear to auscultation       Cardiovascular hypertension (amlodipine, carvedilol, hydralazine, torsemide), Pt. on medications and Pt. on home beta blockers +CHF   Rhythm:Regular Rate:Normal  Cardiac Perfusion 10/19/2022   LV perfusion is normal. There is no evidence of ischemia. There is no evidence of infarction.   Rest left ventricular function is normal. Rest EF: 66 %. Stress left ventricular function is normal. Stress EF: 76 %. End diastolic cavity size is normal. End systolic cavity size is normal.   Myocardial blood flow was computed to be 1.29ml/g/min at rest and 1.15ml/g/min at stress. Global myocardial blood flow reserve was 1.97 and was mildly abnormal.   Coronary calcium was present on the attenuation correction CT images. Minimal coronary calcifications were present. Coronary calcifications were present in the left anterior descending artery distribution(s).   The study is normal. The study is low risk.   Normal perfusion, appropriate increase in EF with stress and minimal calcium in LAD  Mildly abnormal MBF reserve may represent microvascular diseae in setting of LVH with voltage criteria on ECG  TTE 09/16/2021: IMPRESSIONS     1. Left ventricular ejection fraction, by estimation, is 60 to 65%. The  left ventricle has normal function. The left  ventricle has no regional  wall motion abnormalities. Left ventricular diastolic parameters are  consistent with Grade II diastolic  dysfunction (pseudonormalization).   2. Right ventricular systolic function is normal. The right ventricular  size is normal.   3. Left atrial size was mildly dilated.   4. The mitral valve is normal in structure. No evidence of mitral valve  regurgitation.   5. The aortic valve is normal in structure. Aortic valve regurgitation is  not visualized.     Neuro/Psych neg Seizures  Neuromuscular disease (neuropathy)    GI/Hepatic ,GERD  Medicated,,(+) Hepatitis -, C  Endo/Other  diabetes, Type 2  Patient on GLP-1 Agonist, Grave's disease  Renal/GU CRFRenal disease     Musculoskeletal negative musculoskeletal ROS (+)    Abdominal  (+) + obese  Peds  Hematology  (+) Blood dyscrasia, anemia Lab Results      Component                Value               Date                      WBC                      3.7 (L)             01/20/2023                HGB                      10.3 (L)  01/20/2023                HCT                      32.7 (L)            01/20/2023                MCV                      92.6                01/20/2023                PLT                      127 (L)             01/20/2023              Anesthesia Other Findings Right fifth metacarpal fracture  Last Ozempic: 08/08/2023  Last Jardiance: unsure, likely yesterday as she has pill packs  Reproductive/Obstetrics                             Anesthesia Physical Anesthesia Plan  ASA: 3  Anesthesia Plan: MAC and Regional   Post-op Pain Management: Regional block* and Tylenol PO (pre-op)*   Induction: Intravenous  PONV Risk Score and Plan: 2 and Ondansetron, Dexamethasone, Propofol infusion, TIVA and Treatment may vary due to age or medical condition  Airway Management Planned: Natural Airway and Simple Face Mask  Additional  Equipment:   Intra-op Plan:   Post-operative Plan: Extubation in OR  Informed Consent: I have reviewed the patients History and Physical, chart, labs and discussed the procedure including the risks, benefits and alternatives for the proposed anesthesia with the patient or authorized representative who has indicated his/her understanding and acceptance.     Dental advisory given  Plan Discussed with: CRNA and Anesthesiologist  Anesthesia Plan Comments: (Discussed potential risks of nerve blocks including, but not limited to, infection, bleeding, nerve damage, seizures, pneumothorax, respiratory depression, and potential failure of the block. Alternatives to nerve blocks discussed. All questions answered.  Risks of general anesthesia discussed including, but not limited to, sore throat, hoarse voice, chipped/damaged teeth, injury to vocal cords, nausea and vomiting, allergic reactions, lung infection, heart attack, stroke, and death. All questions answered. )        Anesthesia Quick Evaluation

## 2023-08-11 NOTE — Progress Notes (Addendum)
   08/11/23 1245  PAT Phone Screen  Is the patient taking a GLP-1 receptor agonist? (S)  Yes  Has the patient been informed on holding medication? (S)  Yes (last dose 1/20 instructed to hold 1/27 dose)  Do You Have Diabetes? Yes  Do You Have Hypertension? Yes  Have You Ever Been to the ER for Asthma? No  Have You Taken Oral Steroids in the Past 3 Months? No  Do you Take Phenteramine or any Other Diet Drugs? No  Recent  Lab Work, EKG, CXR? Yes  Where was this test performed? EKG 01/2023  Do you have a history of heart problems? Yes  Cardiologist Name Dr Antoine Poche primary. Last OV for Cardiac Clearance for previous surgery 10/27/22 w/ [facility] NP.  Have you ever had tests on your heart? Yes  What cardiac tests were performed? Echo;Other (comment)  What date/year were cardiac tests completed? Echo EF 60-65% March 2023. Cardiac Perfusion study 10/2022 EF 66%  Results viewable: Care Everywhere;[facility] Media Tab  Any Recent Hospitalizations? No  Height 5\' 4"  (1.626 m)  Weight 97.2 kg  Pat Appointment Scheduled (S)  Yes (BMET)   Patient's past medical history, cardiology notes and anesthesia record from surgery 12/02/22 reviewed with Dr Bradley Ferris. Pt will need cardiac clearance for this upcoming surgery scheduled 08/17/23. LVM w Lynnea Ferrier at Dr Georgia Duff office requesting clearance.  08/15/23 12:15 pm Cardiac clearance reviewed with Dr Tacy Dura- ok to proceed with surgery at [facility].

## 2023-08-15 ENCOUNTER — Ambulatory Visit: Payer: 59 | Attending: Physician Assistant | Admitting: Physician Assistant

## 2023-08-15 DIAGNOSIS — Z01810 Encounter for preprocedural cardiovascular examination: Secondary | ICD-10-CM | POA: Diagnosis not present

## 2023-08-15 NOTE — Progress Notes (Signed)
   Virtual Visit via Telephone Note   Because of [name] [name]'s co-morbid illnesses, she is at least at moderate risk for complications without adequate follow up.  This format is felt to be most appropriate for this patient at this time.  The patient did not have access to video technology/had technical difficulties with video requiring transitioning to audio format only (telephone).  All issues noted in this document were discussed and addressed.  No physical exam could be performed with this format.  Please refer to the patient's chart for her consent to telehealth for [facility].  Evaluation Performed:  Preoperative cardiovascular risk assessment _____________   Date:  08/15/2023   Patient ID:  [name] [name], DOB 22-Dec-1960, MRN [id number] Patient Location:  Home Provider location:   Office  Primary Care Provider:  Hoy Register, MD Primary Cardiologist:  Rollene Rotunda, MD  Chief Complaint / Patient Profile   63 y.o. y/o female with a h/o   (HFpEF) heart failure with preserved ejection fraction  TTE 09/16/21: EF 60-65, no RWMA, Gr 2 DD, NL RVSF, mild LAE Followed in CHF Clinic (Dr. Shirlee Latch) - hx of multiple prior admx for HF Coronary artery Ca2+ Myocardial PET 10/19/22: no ischemia or infarction, EF 66, GMBFR normal, low risk, min cor Ca2+ Hypertension LVH Hyperlipidemia  Diabetes mellitus  OSA  Hep C  who is pending ORIF for R 5th metacarpal Fx on 08/17/23 under conscious sedation with regional block by Dr. Frazier Butt and presents today for telephonic preoperative cardiovascular risk assessment.    History of Present Illness    [name] [name] is a 63 y.o. female who presents via audio/video conferencing for a telehealth visit today.  Pt was last seen in CHF clinic on 10/27/22 by Prince Rome, NP.  At that time [name] [name] was doing well.  The patient is now pending procedure as outlined above. Since her last visit, she has been well without chest pain or  shortness of breath.    Home Medications       Physical Exam    Vital Signs:  [name] [name] does not have vital signs available for review today.  Given telephonic nature of communication, physical exam is limited. AAOx3. NAD. Normal affect.  Speech and respirations are unlabored.  Accessory Clinical Findings    None    Assessment & Plan    Assessment & Plan Preoperative cardiovascular examination Ms. Kallio's perioperative risk of a major cardiac event is 6.6% according to the Revised Cardiac Risk Index (RCRI).  Therefore, she is at high risk for perioperative complications.   Her functional capacity is fair at 4.73 METs according to the Duke Activity Status Index (DASI). Recommendations: According to ACC/AHA guidelines, no further cardiovascular testing needed.  The patient may proceed to surgery at acceptable risk.      A copy of this note will be routed to requesting surgeon.  Time:   Today, I have spent 5 minutes with the patient with telehealth technology discussing medical history, symptoms, and management plan.     Tereso Newcomer, PA-C  08/15/2023, 8:24 PM

## 2023-08-16 ENCOUNTER — Encounter (HOSPITAL_BASED_OUTPATIENT_CLINIC_OR_DEPARTMENT_OTHER)
Admission: RE | Admit: 2023-08-16 | Discharge: 2023-08-16 | Disposition: A | Discharge status: Home / Self Care | Payer: 59 | Source: Ambulatory Visit | Attending: Orthopedic Surgery | Admitting: Orthopedic Surgery

## 2023-08-16 DIAGNOSIS — Z7985 Long-term (current) use of injectable non-insulin antidiabetic drugs: Secondary | ICD-10-CM | POA: Diagnosis not present

## 2023-08-16 DIAGNOSIS — E0500 Thyrotoxicosis with diffuse goiter without thyrotoxic crisis or storm: Secondary | ICD-10-CM | POA: Diagnosis not present

## 2023-08-16 DIAGNOSIS — I509 Heart failure, unspecified: Secondary | ICD-10-CM | POA: Diagnosis not present

## 2023-08-16 DIAGNOSIS — X58XXXA Exposure to other specified factors, initial encounter: Secondary | ICD-10-CM | POA: Diagnosis not present

## 2023-08-16 DIAGNOSIS — I110 Hypertensive heart disease with heart failure: Secondary | ICD-10-CM | POA: Diagnosis not present

## 2023-08-16 DIAGNOSIS — S62316A Displaced fracture of base of fifth metacarpal bone, right hand, initial encounter for closed fracture: Secondary | ICD-10-CM | POA: Diagnosis present

## 2023-08-16 DIAGNOSIS — J449 Chronic obstructive pulmonary disease, unspecified: Secondary | ICD-10-CM | POA: Diagnosis not present

## 2023-08-16 DIAGNOSIS — E1140 Type 2 diabetes mellitus with diabetic neuropathy, unspecified: Secondary | ICD-10-CM | POA: Diagnosis not present

## 2023-08-16 DIAGNOSIS — B182 Chronic viral hepatitis C: Secondary | ICD-10-CM | POA: Diagnosis not present

## 2023-08-16 DIAGNOSIS — Z79899 Other long term (current) drug therapy: Secondary | ICD-10-CM | POA: Diagnosis not present

## 2023-08-16 DIAGNOSIS — G4730 Sleep apnea, unspecified: Secondary | ICD-10-CM | POA: Diagnosis not present

## 2023-08-16 DIAGNOSIS — Z7984 Long term (current) use of oral hypoglycemic drugs: Secondary | ICD-10-CM | POA: Diagnosis not present

## 2023-08-16 DIAGNOSIS — K219 Gastro-esophageal reflux disease without esophagitis: Secondary | ICD-10-CM | POA: Diagnosis not present

## 2023-08-16 LAB — COMPREHENSIVE METABOLIC PANEL
ALT: 9 U/L (ref 0–44)
AST: 12 U/L — ABNORMAL LOW (ref 15–41)
Albumin: 3.7 g/dL (ref 3.5–5.0)
Alkaline Phosphatase: 73 U/L (ref 38–126)
Anion gap: 10 (ref 5–15)
BUN: 21 mg/dL (ref 8–23)
CO2: 26 mmol/L (ref 22–32)
Calcium: 8.9 mg/dL (ref 8.9–10.3)
Chloride: 107 mmol/L (ref 98–111)
Creatinine, Ser: 2.06 mg/dL — ABNORMAL HIGH (ref 0.44–1.00)
GFR, Estimated: 27 mL/min — ABNORMAL LOW (ref >60)
Glucose, Bld: 127 mg/dL — ABNORMAL HIGH (ref 70–99)
Potassium: 3.3 mmol/L — ABNORMAL LOW (ref 3.5–5.1)
Sodium: 143 mmol/L (ref 135–145)
Total Bilirubin: 1 mg/dL (ref 0.0–1.2)
Total Protein: 7 g/dL (ref 6.5–8.1)

## 2023-08-16 NOTE — Progress Notes (Signed)

## 2023-08-17 ENCOUNTER — Ambulatory Visit (HOSPITAL_BASED_OUTPATIENT_CLINIC_OR_DEPARTMENT_OTHER): Payer: 59 | Admitting: Anesthesiology

## 2023-08-17 ENCOUNTER — Ambulatory Visit (HOSPITAL_BASED_OUTPATIENT_CLINIC_OR_DEPARTMENT_OTHER)
Admission: RE | Admit: 2023-08-17 | Discharge: 2023-08-17 | Disposition: A | Discharge status: Home / Self Care | Payer: 59 | Attending: Orthopedic Surgery | Admitting: Orthopedic Surgery

## 2023-08-17 ENCOUNTER — Other Ambulatory Visit: Payer: Self-pay

## 2023-08-17 ENCOUNTER — Encounter (HOSPITAL_BASED_OUTPATIENT_CLINIC_OR_DEPARTMENT_OTHER)
Admission: RE | Disposition: A | Discharge status: Home / Self Care | Payer: Self-pay | Source: Home / Self Care | Attending: Orthopedic Surgery

## 2023-08-17 ENCOUNTER — Encounter (HOSPITAL_BASED_OUTPATIENT_CLINIC_OR_DEPARTMENT_OTHER): Payer: Self-pay | Admitting: Orthopedic Surgery

## 2023-08-17 ENCOUNTER — Ambulatory Visit (HOSPITAL_BASED_OUTPATIENT_CLINIC_OR_DEPARTMENT_OTHER): Payer: 59

## 2023-08-17 DIAGNOSIS — S62306A Unspecified fracture of fifth metacarpal bone, right hand, initial encounter for closed fracture: Secondary | ICD-10-CM

## 2023-08-17 DIAGNOSIS — J449 Chronic obstructive pulmonary disease, unspecified: Secondary | ICD-10-CM | POA: Insufficient documentation

## 2023-08-17 DIAGNOSIS — I509 Heart failure, unspecified: Secondary | ICD-10-CM | POA: Insufficient documentation

## 2023-08-17 DIAGNOSIS — I110 Hypertensive heart disease with heart failure: Secondary | ICD-10-CM | POA: Insufficient documentation

## 2023-08-17 DIAGNOSIS — E0500 Thyrotoxicosis with diffuse goiter without thyrotoxic crisis or storm: Secondary | ICD-10-CM | POA: Insufficient documentation

## 2023-08-17 DIAGNOSIS — Z7985 Long-term (current) use of injectable non-insulin antidiabetic drugs: Secondary | ICD-10-CM | POA: Insufficient documentation

## 2023-08-17 DIAGNOSIS — E1140 Type 2 diabetes mellitus with diabetic neuropathy, unspecified: Secondary | ICD-10-CM | POA: Insufficient documentation

## 2023-08-17 DIAGNOSIS — Z79899 Other long term (current) drug therapy: Secondary | ICD-10-CM | POA: Insufficient documentation

## 2023-08-17 DIAGNOSIS — S62316A Displaced fracture of base of fifth metacarpal bone, right hand, initial encounter for closed fracture: Secondary | ICD-10-CM | POA: Insufficient documentation

## 2023-08-17 DIAGNOSIS — B182 Chronic viral hepatitis C: Secondary | ICD-10-CM | POA: Insufficient documentation

## 2023-08-17 DIAGNOSIS — K219 Gastro-esophageal reflux disease without esophagitis: Secondary | ICD-10-CM | POA: Insufficient documentation

## 2023-08-17 DIAGNOSIS — E1122 Type 2 diabetes mellitus with diabetic chronic kidney disease: Secondary | ICD-10-CM | POA: Diagnosis not present

## 2023-08-17 DIAGNOSIS — X58XXXA Exposure to other specified factors, initial encounter: Secondary | ICD-10-CM | POA: Insufficient documentation

## 2023-08-17 DIAGNOSIS — E119 Type 2 diabetes mellitus without complications: Secondary | ICD-10-CM

## 2023-08-17 DIAGNOSIS — I5031 Acute diastolic (congestive) heart failure: Secondary | ICD-10-CM | POA: Diagnosis not present

## 2023-08-17 DIAGNOSIS — N184 Chronic kidney disease, stage 4 (severe): Secondary | ICD-10-CM | POA: Diagnosis not present

## 2023-08-17 DIAGNOSIS — I130 Hypertensive heart and chronic kidney disease with heart failure and stage 1 through stage 4 chronic kidney disease, or unspecified chronic kidney disease: Secondary | ICD-10-CM | POA: Diagnosis not present

## 2023-08-17 DIAGNOSIS — Z7984 Long term (current) use of oral hypoglycemic drugs: Secondary | ICD-10-CM | POA: Insufficient documentation

## 2023-08-17 DIAGNOSIS — G4730 Sleep apnea, unspecified: Secondary | ICD-10-CM | POA: Insufficient documentation

## 2023-08-17 HISTORY — PX: OPEN REDUCTION INTERNAL FIXATION (ORIF) METACARPAL: SHX6234

## 2023-08-17 LAB — GLUCOSE, CAPILLARY
Glucose-Capillary: 99 mg/dL (ref 70–99)
Glucose-Capillary: 90 mg/dL (ref 70–99)

## 2023-08-17 SURGERY — OPEN REDUCTION INTERNAL FIXATION (ORIF) METACARPAL
Anesthesia: Monitor Anesthesia Care | Site: Finger | Laterality: Right

## 2023-08-17 MED ORDER — ONDANSETRON HCL 4 MG/2ML IJ SOLN
INTRAMUSCULAR | Status: DC | PRN
  Administered 2023-08-17: 4 mg via INTRAVENOUS

## 2023-08-17 MED ORDER — MIDAZOLAM HCL 5 MG/5ML IJ SOLN
INTRAMUSCULAR | Status: DC | PRN
  Administered 2023-08-17: 1 mg via INTRAVENOUS

## 2023-08-17 MED ORDER — CEFAZOLIN SODIUM-DEXTROSE 2-4 GM/100ML-% IV SOLN
2.0000 g | INTRAVENOUS | Status: AC
  Administered 2023-08-17: 2 g via INTRAVENOUS

## 2023-08-17 MED ORDER — FENTANYL CITRATE (PF) 100 MCG/2ML IJ SOLN
INTRAMUSCULAR | Status: DC | PRN
  Administered 2023-08-17: 25 ug via INTRAVENOUS

## 2023-08-17 MED ORDER — 0.9 % SODIUM CHLORIDE (POUR BTL) OPTIME
TOPICAL | Status: DC | PRN
  Administered 2023-08-17: 175 mL

## 2023-08-17 MED ORDER — ROPIVACAINE HCL 5 MG/ML IJ SOLN
INTRAMUSCULAR | Status: DC | PRN
  Administered 2023-08-17: 20 mL via PERINEURAL

## 2023-08-17 MED ORDER — LACTATED RINGERS IV SOLN: INTRAVENOUS | Status: DC

## 2023-08-17 MED ORDER — MIDAZOLAM HCL 2 MG/2ML IJ SOLN
2.0000 mg | Freq: Once | INTRAMUSCULAR | Status: AC
  Administered 2023-08-17: 2 mg via INTRAVENOUS

## 2023-08-17 MED ORDER — PROPOFOL 500 MG/50ML IV EMUL
INTRAVENOUS | Status: DC | PRN
  Administered 2023-08-17: 125 ug/kg/min via INTRAVENOUS

## 2023-08-17 MED ORDER — MEPIVACAINE HCL (PF) 2 % IJ SOLN
INTRAMUSCULAR | Status: DC | PRN
  Administered 2023-08-17: 5 mL

## 2023-08-17 MED ORDER — LIDOCAINE HCL (CARDIAC) PF 100 MG/5ML IV SOSY
PREFILLED_SYRINGE | INTRAVENOUS | Status: DC | PRN
  Administered 2023-08-17: 40 mg via INTRAVENOUS

## 2023-08-17 MED ORDER — OXYCODONE HCL 5 MG PO TABS: 5.0000 mg | ORAL_TABLET | Freq: Once | ORAL | Status: DC | PRN

## 2023-08-17 MED ORDER — MIDAZOLAM HCL 2 MG/2ML IJ SOLN
INTRAMUSCULAR | Status: AC
  Filled 2023-08-17: qty 2

## 2023-08-17 MED ORDER — FENTANYL CITRATE (PF) 100 MCG/2ML IJ SOLN: 25.0000 ug | INTRAMUSCULAR | Status: DC | PRN

## 2023-08-17 MED ORDER — SODIUM CHLORIDE 0.9 % IV SOLN: INTRAVENOUS | Status: DC | PRN

## 2023-08-17 MED ORDER — ACETAMINOPHEN 500 MG PO TABS
ORAL_TABLET | ORAL | Status: AC
  Filled 2023-08-17: qty 2

## 2023-08-17 MED ORDER — FENTANYL CITRATE (PF) 100 MCG/2ML IJ SOLN
INTRAMUSCULAR | Status: AC
  Filled 2023-08-17: qty 2

## 2023-08-17 MED ORDER — OXYCODONE HCL 5 MG/5ML PO SOLN: 5.0000 mg | Freq: Once | ORAL | Status: DC | PRN

## 2023-08-17 MED ORDER — OXYCODONE HCL 5 MG PO TABS: 5.0000 mg | ORAL_TABLET | Freq: Four times a day (QID) | ORAL | 0 refills | Status: AC | PRN

## 2023-08-17 MED ORDER — CEFAZOLIN SODIUM-DEXTROSE 2-4 GM/100ML-% IV SOLN
INTRAVENOUS | Status: AC
Start: 2023-08-17 — End: ?
  Filled 2023-08-17: qty 100

## 2023-08-17 MED ORDER — FENTANYL CITRATE (PF) 100 MCG/2ML IJ SOLN
50.0000 ug | Freq: Once | INTRAMUSCULAR | Status: AC
  Administered 2023-08-17: 50 ug via INTRAVENOUS

## 2023-08-17 MED ORDER — ACETAMINOPHEN 500 MG PO TABS
1000.0000 mg | ORAL_TABLET | Freq: Once | ORAL | Status: AC
  Administered 2023-08-17: 1000 mg via ORAL

## 2023-08-17 SURGICAL SUPPLY — 38 items
BIT DRILL 2X3.5 HAND QK RELEAS (BIT) IMPLANT
BLADE SURG 15 STRL LF DISP TIS (BLADE) ×1 IMPLANT
BNDG ELASTIC 3INX 5YD STR LF (GAUZE/BANDAGES/DRESSINGS) ×1 IMPLANT
BNDG ESMARK 4X9 LF (GAUZE/BANDAGES/DRESSINGS) ×1 IMPLANT
BNDG GAUZE DERMACEA FLUFF 4 (GAUZE/BANDAGES/DRESSINGS) ×1 IMPLANT
CHLORAPREP W/TINT 26 (MISCELLANEOUS) ×1 IMPLANT
CORD BIPOLAR FORCEPS 12FT (ELECTRODE) ×1 IMPLANT
COVER BACK TABLE 60X90IN (DRAPES) ×1 IMPLANT
CUFF TOURN SGL QUICK 18X4 (TOURNIQUET CUFF) ×1 IMPLANT
DRAPE EXTREMITY T 121X128X90 (DISPOSABLE) ×1 IMPLANT
DRAPE OEC MINIVIEW 54X84 (DRAPES) ×1 IMPLANT
DRAPE SURG 17X23 STRL (DRAPES) ×1 IMPLANT
GAUZE XEROFORM 1X8 LF (GAUZE/BANDAGES/DRESSINGS) ×1 IMPLANT
GLOVE BIO SURGEON STRL SZ7 (GLOVE) ×1 IMPLANT
GLOVE BIOGEL PI IND STRL 7.0 (GLOVE) ×1 IMPLANT
GOWN STRL REUS W/ TWL LRG LVL3 (GOWN DISPOSABLE) ×2 IMPLANT
NS IRRIG 1000ML POUR BTL (IV SOLUTION) ×1 IMPLANT
PACK BASIN DAY SURGERY FS (CUSTOM PROCEDURE TRAY) ×1 IMPLANT
PAD CAST 3X4 CTTN HI CHSV (CAST SUPPLIES) ×1 IMPLANT
PAD CAST 4YDX4 CTTN HI CHSV (CAST SUPPLIES) ×1 IMPLANT
PLATE T OFFSET .8MM (Plate) IMPLANT
PUTTY DBM STAGRAFT 2CC (Putty) IMPLANT
SCREW BONE LOCK 2.3X14MM HEXA (Screw) IMPLANT
SCREW LOCK HEX MULTI 2.3X10 (Screw) IMPLANT
SCREW LOCK HEX MULTI 2.3X12 (Screw) IMPLANT
SCREW LOCK HEX MULTI 2.3X9 (Screw) IMPLANT
SHEET MEDIUM DRAPE 40X70 STRL (DRAPES) ×1 IMPLANT
SLEEVE SCD COMPRESS KNEE MED (STOCKING) IMPLANT
SLING ARM FOAM STRAP LRG (SOFTGOODS) IMPLANT
SPLINT FIBERGLASS 4X30 (CAST SUPPLIES) ×1 IMPLANT
SUT ETHILON 4 0 PS 2 18 (SUTURE) ×1 IMPLANT
SUT MNCRL AB 3-0 PS2 18 (SUTURE) IMPLANT
SUT MNCRL AB 4-0 PS2 18 (SUTURE) ×1 IMPLANT
SUT VIC AB 4-0 PS2 18 (SUTURE) IMPLANT
SYR BULB EAR ULCER 3OZ GRN STR (SYRINGE) ×1 IMPLANT
TACK PLATE ORTHO 40MM (WIRE) IMPLANT
TOWEL GREEN STERILE FF (TOWEL DISPOSABLE) ×2 IMPLANT
UNDERPAD 30X36 HEAVY ABSORB (UNDERPADS AND DIAPERS) ×1 IMPLANT

## 2023-08-17 NOTE — Anesthesia Postprocedure Evaluation (Signed)
Anesthesia Post Note  Patient: [name] [name]  Procedure(s) Performed: OPEN REDUCTION INTERNAL FIXATION (ORIF)  FIFTH METACARPAL (Right: Finger)     Patient location during evaluation: PACU Anesthesia Type: Regional and MAC Level of consciousness: awake Pain management: pain level controlled Vital Signs Assessment: post-procedure vital signs reviewed and stable Respiratory status: spontaneous breathing, nonlabored ventilation and respiratory function stable Cardiovascular status: stable and blood pressure returned to baseline Postop Assessment: no apparent nausea or vomiting Anesthetic complications: no   No notable events documented.  Last Vitals:  Vitals:   08/17/23 1345 08/17/23 1400  BP: (!) 181/89   Pulse: 62 64  Resp: 12 17  Temp:    SpO2: 100% 96%    Last Pain:  Vitals:   08/17/23 1400  TempSrc:   PainSc: Asleep                 Linton Rump

## 2023-08-17 NOTE — Progress Notes (Signed)
Pt and family educated on how to use incentive spirometer and encouraged pt to use several times each hour at home. Pt maintaining oxygen saturation above 92 at discharge.

## 2023-08-17 NOTE — Brief Op Note (Signed)
08/17/2023  1:04 PM  PATIENT:  [name] [name]  63 y.o. female  PRE-OPERATIVE DIAGNOSIS:  Right fifth metacarpal fracture  POST-OPERATIVE DIAGNOSIS:  Right fifth metacarpal fracture  PROCEDURE:  Procedure(s) with comments: OPEN REDUCTION INTERNAL FIXATION (ORIF)  FIFTH METACARPAL (Right) - regional  SURGEON:  Surgeons and Role:    * Marlyne Beards, MD - Primary  PHYSICIAN ASSISTANT:   ASSISTANTS: none   ANESTHESIA:   regional and MAC  EBL:  15 mL   BLOOD ADMINISTERED:none  DRAINS: none   LOCAL MEDICATIONS USED:  NONE  SPECIMEN:  No Specimen  DISPOSITION OF SPECIMEN:  N/A  COUNTS:  YES  TOURNIQUET:   Total Tourniquet Time Documented: Upper Arm (Right) - 85 minutes Total: Upper Arm (Right) - 85 minutes   DICTATION: .Reubin Milan Dictation  PLAN OF CARE: Discharge to home after PACU  PATIENT DISPOSITION:  PACU - hemodynamically stable.   Delay start of Pharmacological VTE agent (>24hrs) due to surgical blood loss or risk of bleeding: not applicable

## 2023-08-17 NOTE — Progress Notes (Signed)
Assisted Dr. Ardeen Jourdain with right, supraclavicular, ultrasound guided block. Side rails up, monitors on throughout procedure. See vital signs in flow sheet. Tolerated Procedure well.

## 2023-08-17 NOTE — Transfer of Care (Signed)
Immediate Anesthesia Transfer of Care Note  Patient: [name] [name]  Procedure(s) Performed: OPEN REDUCTION INTERNAL FIXATION (ORIF)  FIFTH METACARPAL (Right: Finger)  Patient Location: PACU  Anesthesia Type:MAC combined with regional for post-op pain  Level of Consciousness: sedated  Airway & Oxygen Therapy: Patient Spontanous Breathing and Patient connected to face mask oxygen  Post-op Assessment: Report given to RN and Post -op Vital signs reviewed and stable  Post vital signs: Reviewed and stable  Last Vitals:  Vitals Value Taken Time  BP    Temp    Pulse    Resp    SpO2      Last Pain:  Vitals:   08/17/23 0923  TempSrc: Oral  PainSc: 5       Patients Stated Pain Goal: 7 (08/17/23 0923)  Complications: No notable events documented.

## 2023-08-17 NOTE — Interval H&P Note (Signed)
History and Physical Interval Note:  08/17/2023 9:11 AM  [name] [name]  has presented today for surgery, with the diagnosis of Right fifth metacarpal fracture.  The various methods of treatment have been discussed with the patient and family. After consideration of risks, benefits and other options for treatment, the patient has consented to  Procedure(s) with comments: OPEN REDUCTION INTERNAL FIXATION (ORIF)  FIFTH METACARPAL (Right) - regional as a surgical intervention.  The patient's history has been reviewed, patient examined, no change in status, stable for surgery.  I have reviewed the patient's chart and labs.  Questions were answered to the patient's satisfaction.     [name]

## 2023-08-17 NOTE — H&P (Signed)
HAND SURGERY   HPI: Patient is a 63 y.o. female who presents with a closed, right, extra-articular fifth metacarpal base fracture that occurred last week.  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  Patient denies any changes to their medical history or new systemic symptoms today.    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   Qualifier: Diagnosis of  By: Huntley Dec, Scott     Hypertension    MRSA (methicillin resistant Staphylococcus aureus)    Trichomonas    VAGINITIS, BACTERIAL, RECURRENT 08/15/2007   Qualifier: Diagnosis of  By: Barbaraann Barthel MD, [place]     Past Surgical History:  Procedure Laterality Date   BREAST BIOPSY Left 2018   BREAST BIOPSY Left 06/08/2022   BREAST BIOPSY Right 06/08/2022   BREAST BIOPSY Right 06/08/2022   [place] RT BREAST BX W LOC DEV 1ST LESION IMG BX SPEC US GUIDE 06/08/2022 [facility]   BREAST BIOPSY Left 06/08/2022   [place] LT BREAST BX W LOC DEV 1ST LESION IMG BX SPEC US GUIDE 06/08/2022 [facility]   BREAST BIOPSY  12/01/2022   MM LT RADIOACTIVE SEED LOC MAMMO GUIDE 12/01/2022 [facility]   BREAST LUMPECTOMY WITH RADIOACTIVE SEED LOCALIZATION Left 12/02/2022   Procedure: LEFT BREAST LUMPECTOMY WITH RADIOACTIVE SEED LOCALIZATION;  Surgeon: Harriette Bouillon, MD;  Location: [facility] OR;  Service: General;  Laterality: Left;   CESAREAN SECTION     breech   Social History   Socioeconomic History   Marital status: Legally Separated    Spouse name: Not on file   Number of children: 1   Years of education: Not on file   Highest education level: 9th grade  Occupational History   Occupation: disability  Tobacco Use   Smoking status:  Never   Smokeless tobacco: Never  Vaping Use   Vaping status: Never Used  Substance and Sexual Activity   Alcohol use: No   Drug use: Not Currently    Types: Cocaine, Marijuana    Comment: remote h/o cocaine 2015 and marijuana 2018   Sexual activity: Yes  Other Topics Concern   Not on file  Social History Narrative   Lives with friend, [name] and nephew.  One daughter.    Social Drivers of Corporate investment banker Strain: Low Risk  (07/22/2022)   Overall Financial Resource Strain (CARDIA)    Difficulty of Paying Living Expenses: Not very hard  Food Insecurity: No Food Insecurity (02/23/2023)   Hunger Vital Sign    Worried About Running Out of Food in the Last Year: Never true    Ran Out of Food in the Last Year: Never true  Transportation Needs: No Transportation Needs (02/23/2023)   PRAPARE - Administrator, Civil Service (Medical): No    Lack of Transportation (Non-Medical): No  Physical Activity: Inactive (02/23/2023)   Exercise Vital Sign    Days of Exercise per Week: 0 days    Minutes of Exercise per Session: 0 min  Stress: No Stress Concern Present (02/23/2023)   [employer] of Occupational Health - Occupational Stress Questionnaire    Feeling of Stress : Not at all  Social Connections: Moderately Isolated (02/23/2023)   Social Connection and Isolation Panel [NHANES]    Frequency of Communication with Friends and Family: More than three times a week    Frequency of Social Gatherings with Friends and Family: Twice a week    Attends Religious Services: More than 4 times per year    Active Member of [employer] or Organizations: No    Attends Engineer, structural: Never    Marital Status: Divorced   Family History  Problem Relation Age of Onset   Colon cancer Mother    Liver disease Sister    Other Neg Hx    Breast cancer Neg Hx    Esophageal cancer Neg Hx    Rectal cancer Neg Hx    Tremor Neg Hx    - negative except otherwise stated in the family  history section Allergies  Allergen Reactions   Lisinopril Swelling   Prior to Admission medications   Medication Sig Start Date End Date Taking? Authorizing Provider  amLODipine (NORVASC) 2.5 MG tablet Take 1 Tablet by mouth once daily (morning) 04/14/23  Yes Milford, Anderson Malta, FNP  atorvastatin (LIPITOR) 40 MG tablet take 1 Tablet by mouth in the morning (morning) 04/14/23  Yes Milford, Anderson Malta, FNP  carvedilol (COREG) 25 MG tablet Take 1 tablet (25 mg total) by mouth 2 (two) times daily. 11/12/22  Yes Milford, Anderson Malta, FNP  famotidine (PEPCID) 40 MG tablet TAKE 1 Tablet BY MOUTH ONCE DAILY (evening) 05/13/23  Yes Hoy Register, MD  pregabalin (LYRICA) 75 MG capsule Take 1 capsule (75 mg total) by mouth 2 (two) times daily. 07/05/23  Yes Hoy Register, MD  albuterol (VENTOLIN HFA) 108 (90 Base) MCG/ACT inhaler Inhale 2 puffs into the lungs every 4 (four) hours as needed for wheezing or shortness of breath. 12/01/22   Hoy Register, MD  benzonatate (TESSALON) 100 MG capsule Take 1 capsule (100 mg total) by mouth every 8 (eight) hours. 06/04/23   Renne Crigler, PA-C  carboxymethylcellul-glycerin (LUBRICATING EYE DROPS) 0.5-0.9 % ophthalmic solution Place 1 drop into both eyes daily as needed (dry eyes). 12/01/22   Hoy Register, MD  dicyclomine (BENTYL) 10 MG capsule Take 1 capsule (10 mg total) by mouth 3 (three) times daily before meals. 04/05/23   Hoy Register, MD  hydrALAZINE (APRESOLINE) 25 MG tablet Take 1 tablet (25 mg total) by mouth 3 (three) times daily. 11/18/22 02/23/23  Jacklynn Ganong, FNP  hydrALAZINE (APRESOLINE) 50 MG tablet Take 1 tablet (50 mg total) by mouth 3 (three) times daily. 11/18/22   Jacklynn Ganong, FNP  JARDIANCE 10 MG TABS tablet Take 1 Tablet by mouth once daily (morning) 04/14/23   Milford, Anderson Malta, FNP  oxybutynin (DITROPAN) 5 MG tablet Take 1 tablet (5 mg total) by mouth 2 (two) times daily. 07/05/23   Hoy Register, MD   promethazine-dextromethorphan (PROMETHAZINE-DM) 6.25-15 MG/5ML syrup Take 5 mLs by mouth 4 (four) times daily as needed for cough. 06/04/23   Renne Crigler, PA-C  Semaglutide, 1 MG/DOSE, (OZEMPIC, 1 MG/DOSE,) 4 MG/3ML SOPN Inject 1 mg into the skin once a week. 07/05/23   Hoy Register, MD  spironolactone (ALDACTONE) 25 MG tablet Take 0.5 tablets (12.5 mg total) by mouth daily. 06/15/23   Jacklynn Ganong, FNP  torsemide (DEMADEX) 20 MG tablet Take 1 tablet (20 mg total) by mouth daily. 11/12/22   Milford, Anderson Malta, FNP   No results found. - Positive ROS: All other systems have been reviewed and were  otherwise negative with the exception of those mentioned in the HPI and as above.  Physical Exam: General: No acute distress, resting comfortably Cardiovascular: BUE warm and well perfused, normal rate Respiratory: Normal WOB on RA Skin: Warm and dry Neurologic: Sensation intact distally Psychiatric: Patient is at baseline mood and affect  Right Upper Extremity  Mild swelling at the dorsal ulnar aspect of the hand.  She is exquisitely tender to palpation at the base of the fifth metacarpal.  She has limited active range of motion of her finger secondary to pain and swelling.  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  She has mild ecchymosis in her palm.  Sensation is intact light touch throughout the hand.  Her hand is warm and well-perfused with brisk capillary refill.   Assessment: 63 year old female with a closed, right, extra-articular fifth metacarpal base fracture  Plan: OR today for pain reduction and interpretation of the right fifth metacarpal base with either plate and screw construct or K wires . We again reviewed the risks of surgery which include bleeding, infection, damage to neurovascular structures, persistent symptoms, nonunion, malunion, symptomatic hardware, finger stiffness, persistent clinical malrotation, delayed  wound healing, need for additional surgery.  Informed consent was signed.  All questions were answered.   Marlyne Beards, M.D. [facility] 9:08 AM

## 2023-08-17 NOTE — Discharge Instructions (Addendum)
[name] [name], M.D. Hand Surgery  POST-OPERATIVE DISCHARGE INSTRUCTIONS   PRESCRIPTIONS: You may have been given a prescription to be taken as directed for post-operative pain control.  You may also take over the counter ibuprofen/aleve and tylenol for pain. Take this as directed on the packaging. Do not exceed 3000 mg tylenol/acetaminophen in 24 hours.  Ibuprofen 600-800 mg (3-4) tablets by mouth every 6 hours as needed for pain.  OR Aleve 2 tablets by mouth every 12 hours (twice daily) as needed for pain.  AND/OR Tylenol 1000 mg (2 tablets) every 8 hours as needed for pain.  Please use your pain medication carefully, as refills are limited and you may not be provided with one.  As stated above, please use over the counter pain medicine - it will also be helpful with decreasing your swelling.    ANESTHESIA: After your surgery, post-surgical discomfort or pain is likely. This discomfort can last several days to a few weeks. At certain times of the day your discomfort may be more intense.   Did you receive a nerve block?  A nerve block can provide pain relief for one hour to two days after your surgery. As long as the nerve block is working, you will experience little or no sensation in the area the surgeon operated on.  As the nerve block wears off, you will begin to experience pain or discomfort. It is very important that you begin taking your prescribed pain medication before the nerve block fully wears off. Treating your pain at the first sign of the block wearing off will ensure your pain is better controlled and more tolerable when full-sensation returns. Do not wait until the pain is intolerable, as the medicine will be less effective. It is better to treat pain in advance than to try and catch up.   General Anesthesia:  If you did not receive a nerve block during your surgery, you will need to start taking your pain medication shortly after your surgery and should continue  to do so as prescribed by your surgeon.     ICE AND ELEVATION: You may use ice for the first 48-72 hours, but it is not critical.   Motion of your fingers is very important to decrease the swelling.  Elevation, as much as possible for the next 48 hours, is critical for decreasing swelling as well as for pain relief. Elevation means when you are seated or lying down, you hand should be at or above your heart. When walking, the hand needs to be at or above the level of your elbow.  If the bandage gets too tight, it may need to be loosened. Please contact our office and we will instruct you in how to do this.    SURGICAL BANDAGES:  Keep your dressing and/or splint clean and dry at all times.  Do not remove until you are seen again in the office.  If careful, you may place a plastic bag over your bandage and tape the end to shower, but be careful, do not get your bandages wet.     HAND THERAPY:  You will be contacted to set up your first therapy visit.    ACTIVITY AND WORK: You are encouraged to move any fingers which are not in the bandage.  Light use of the fingers is allowed to assist the other hand with daily hygiene and eating, but strong gripping or lifting is often uncomfortable and should be avoided.  You might miss a variable  period of time from work and hopefully this issue has been discussed prior to surgery. You may not do any heavy work with your affected hand for about 2 weeks.    [facility] Second Floor, [address] [place], [place] [address] [phone]    Post Anesthesia Home Care Instructions  Activity: Get plenty of rest for the remainder of the day. A responsible individual must stay with you for 24 hours following the procedure.  For the next 24 hours, DO NOT: -Drive a car -Advertising copywriter -Drink alcoholic beverages -Take any medication unless instructed by your physician -Make any legal decisions or sign important papers.  Meals: Start with  liquid foods such as gelatin or soup. Progress to regular foods as tolerated. Avoid greasy, spicy, heavy foods. If nausea and/or vomiting occur, drink only clear liquids until the nausea and/or vomiting subsides. Call your physician if vomiting continues.  Special Instructions/Symptoms: Your throat may feel dry or sore from the anesthesia or the breathing tube placed in your throat during surgery. If this causes discomfort, gargle with warm salt water. The discomfort should disappear within 24 hours.  If you had a scopolamine patch placed behind your ear for the management of post- operative nausea and/or vomiting:  1. The medication in the patch is effective for 72 hours, after which it should be removed.  Wrap patch in a tissue and discard in the trash. Wash hands thoroughly with soap and water. 2. You may remove the patch earlier than 72 hours if you experience unpleasant side effects which may include dry mouth, dizziness or visual disturbances. 3. Avoid touching the patch. Wash your hands with soap and water after contact with the patch.  Regional Anesthesia Blocks  1. You may not be able to move or feel the "blocked" extremity after a regional anesthetic block. This may last may last from 3-48 hours after placement, but it will go away. The length of time depends on the medication injected and your individual response to the medication. As the nerves start to wake up, you may experience tingling as the movement and feeling returns to your extremity. If the numbness and inability to move your extremity has not gone away after 48 hours, please call your surgeon.   2. The extremity that is blocked will need to be protected until the numbness is gone and the strength has returned. Because you cannot feel it, you will need to take extra care to avoid injury. Because it may be weak, you may have difficulty moving it or using it. You may not know what position it is in without looking at it while the  block is in effect.  3. For blocks in the legs and feet, returning to weight bearing and walking needs to be done carefully. You will need to wait until the numbness is entirely gone and the strength has returned. You should be able to move your leg and foot normally before you try and bear weight or walk. You will need someone to be with you when you first try to ensure you do not fall and possibly risk injury.  4. Bruising and tenderness at the needle site are common side effects and will resolve in a few days.  5. Persistent numbness or new problems with movement should be communicated to the surgeon or the [facility] [phone] [facility] [phone]).   No tylenol until after 3:30pm today if needed.

## 2023-08-17 NOTE — Anesthesia Procedure Notes (Addendum)
Anesthesia Regional Block: Supraclavicular block   Pre-Anesthetic Checklist: , timeout performed,  Correct Patient, Correct Site, Correct Laterality,  Correct Procedure, Correct Position, site marked,  Risks and benefits discussed,  Surgical consent,  Pre-op evaluation,  At surgeon's request and post-op pain management  Laterality: Right  Prep: chloraprep       Needles:  Injection technique: Single-shot  Needle Type: Echogenic Stimulator Needle     Needle Length: 9cm  Needle Gauge: 21     Additional Needles:   Procedures:,,,, ultrasound used (permanent image in chart),,    Narrative:  Start time: 08/17/2023 9:46 AM End time: 08/17/2023 9:50 AM Injection made incrementally with aspirations every 5 mL.  Performed by: Personally  Anesthesiologist: Linton Rump, MD  Additional Notes: Discussed risks and benefits of nerve block including, but not limited to, prolonged and/or permanent nerve injury involving sensory and/or motor function. Monitors were applied and a time-out was performed. The nerve and associated structures were visualized under ultrasound guidance. After negative aspiration, local anesthetic was slowly injected around the nerve. There was no evidence of high pressure during the procedure. There were no paresthesias. VSS remained stable and the patient tolerated the procedure well.

## 2023-08-18 ENCOUNTER — Encounter (HOSPITAL_BASED_OUTPATIENT_CLINIC_OR_DEPARTMENT_OTHER): Payer: Self-pay | Admitting: Orthopedic Surgery

## 2023-08-18 NOTE — Op Note (Signed)
Date of Surgery: 08/18/2023  INDICATIONS: Patient is a 63 y.o.-year-old female with right fifth metacarpal base fracture.  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  We reviewed both conservative and surgical treatment options in the office with the patient electing to proceed with surgery.  Risks, benefits, and alternatives to surgery were again discussed with the patient in the preoperative area. Informed consent was signed after our discussion.   PREOPERATIVE DIAGNOSIS:  Right fifth metacarpal base fracture  POSTOPERATIVE DIAGNOSIS: Same.  PROCEDURE:  ORIF right fifth metacarpal base fracture   SURGEON: Waylan Rocher, M.D.  ASSIST: None  ANESTHESIA:  Regional, MAC  IV FLUIDS AND URINE: See anesthesia.  ESTIMATED BLOOD LOSS: 10 mL.  IMPLANTS:  Implant Name Type Inv. Item Serial No. Manufacturer Lot No. LRB No. Used Action  PLATE T OFFSET . - ST005-08 004 Plate PLATE T OFFSET . T005-08 004 [employer] STERILIZED IN TRAY Right 1 Implanted  SCREW BONE LOCK 2.3X14MM HEXA - [id number] Screw SCREW BONE LOCK 2.3X14MM HEXA  [employer] STERILIZED IN TRAY Right 1 Implanted  SCREW LOCK HEX MULTI 2.3X10 - [id number] Screw SCREW LOCK HEX MULTI 2.3X10  [employer] STERILIZED IN TRAY Right 1 Implanted  SCREW LOCK HEX MULTI 2.3X12 - [id number] Screw SCREW LOCK HEX MULTI 2.3X12  [employer] STERILIZED IN TRAY Right 2 Implanted  SCREW LOCK HEX MULTI 2.3X9 - [id number] Screw SCREW LOCK HEX MULTI 2.3X9  [employer]  Right 3 Implanted  PUTTY DBM STAGRAFT 2CC - [id number] Putty PUTTY DBM STAGRAFT 2CC  [employer](ORTH,TRAU,BIO,SG) [id number] Right 1 Implanted     DRAINS: None  COMPLICATIONS: None  DESCRIPTION OF PROCEDURE: The patient was met in the preoperative holding area where the surgical site was marked and the consent form was signed.  The patient was then taken to the operating room and remained on the stretcher.  A hand table was placed  adjacent to the right upper extremity and locked into place.  All bony prominences were well padded.  A tourniquet was applied to the Right upper arm.  Monitored anesthesia was induced. The operative extremity was prepped and draped in the usual and sterile fashion.  A formal time-out was performed to confirm that this was the correct patient, surgery, side, and site.   Following formal timeout, the limb was gently exsanguinated with an Esmarch bandage and the tourniquet inflated to 250 mmHg.  I began making a longitudinal incision directly over the fifth metacarpal extending proximally to the level of the fifth CMC joint.  The skin was incised.  Small crossing vessels were coagulated.  Small cutaneous nerve branches were identified and protected.  The extensor mechanism was retracted and the periosteum overlying the fifth metacarpal was incised longitudinally.  The fracture site was identified.  The fracture site was cleared of all hematoma and soft tissue.  Provisional reduction of the fracture was performed with longitudinal traction and rotation.  A phalangeal clamp was used to maintain the reduction.  The fracture was oblique in nature, however, the dorsal cortex was quite thin.  I did not feel that this thin cortex would accommodate a lag screw.  A T plate was chosen that would allow for 3 screws in the proximal segment of the fracture.  The reduction clamp was removed.  The distal aspect of the plate was cut using a bone cutting forcep.  The plate cutter that accompanied the manufacturer's hardware set was broken.  The plate  was placed over the fracture and the proximal 3 holes in the plate filled with bicortical 2.3 mm locking screws.  The fracture was then reduced to the plate and held with a phalangeal clamp.  Rotation of the small finger was assessed via tenodesis.  The digital cascade appeared to be restored without evidence of residual malrotation.  The distal holes in the plate were then filled with  bicortical 2.3 millimeter screws.  The wound was then thoroughly irrigated with copious sterile saline.  AP, lateral, oblique views showed appropriate reduction of the fracture, appropriate position of the plate, and appropriate screw length.  The periosteum over the plate was then closed using a 3-0 Monocryl suture in figure-of-eight fashion.  Tourniquet was deflated.  Hemostasis was achieved with direct pressure over the wound and with bipolar electrocautery.  The skin was then closed using a 3-0 Monocryl suture in buried erupted fashion followed by a 4-0 nylon suture in horizontal mattress fashion.  The wound was then cleaned and dressed with Xeroform, folded Kerlix, cast padding, and a well-padded ulnar gutter splint was applied.  The patient was reversed from sedation.  All counts were correct x 2 at the end of the procedure.  The patient was then taken to the PACU in stable condition.   POSTOPERATIVE PLAN: She will be discharged to home with appropriate pain medication and discharge instructions.  I will see her back in the office in 10 to 14 days for her first postop visit.  A referral has been placed to hand therapy to begin working on gentle active range of motion.  Waylan Rocher, MD 1:04 PM

## 2023-08-29 DIAGNOSIS — M79644 Pain in right finger(s): Secondary | ICD-10-CM | POA: Diagnosis not present

## 2023-08-30 DIAGNOSIS — S62356D Nondisplaced fracture of shaft of fifth metacarpal bone, right hand, subsequent encounter for fracture with routine healing: Secondary | ICD-10-CM | POA: Diagnosis not present

## 2023-09-05 DIAGNOSIS — M79644 Pain in right finger(s): Secondary | ICD-10-CM | POA: Diagnosis not present

## 2023-09-12 DIAGNOSIS — M79644 Pain in right finger(s): Secondary | ICD-10-CM | POA: Diagnosis not present

## 2023-09-19 ENCOUNTER — Other Ambulatory Visit (HOSPITAL_COMMUNITY): Payer: Self-pay | Admitting: Family Medicine

## 2023-09-19 DIAGNOSIS — M79644 Pain in right finger(s): Secondary | ICD-10-CM | POA: Diagnosis not present

## 2023-09-20 DIAGNOSIS — S62356D Nondisplaced fracture of shaft of fifth metacarpal bone, right hand, subsequent encounter for fracture with routine healing: Secondary | ICD-10-CM | POA: Diagnosis not present

## 2023-09-22 ENCOUNTER — Ambulatory Visit: Payer: 59 | Admitting: Internal Medicine

## 2023-09-22 DIAGNOSIS — M79644 Pain in right finger(s): Secondary | ICD-10-CM | POA: Diagnosis not present

## 2023-09-26 DIAGNOSIS — M79644 Pain in right finger(s): Secondary | ICD-10-CM | POA: Diagnosis not present

## 2023-10-20 ENCOUNTER — Ambulatory Visit (INDEPENDENT_AMBULATORY_CARE_PROVIDER_SITE_OTHER): Payer: 59 | Admitting: Podiatry

## 2023-10-20 DIAGNOSIS — Z91199 Patient's noncompliance with other medical treatment and regimen due to unspecified reason: Secondary | ICD-10-CM

## 2023-10-21 DIAGNOSIS — S62356D Nondisplaced fracture of shaft of fifth metacarpal bone, right hand, subsequent encounter for fracture with routine healing: Secondary | ICD-10-CM | POA: Diagnosis not present

## 2023-10-22 NOTE — Progress Notes (Signed)
 Patient absent from appointment

## 2023-10-24 ENCOUNTER — Other Ambulatory Visit (HOSPITAL_COMMUNITY): Payer: Self-pay | Admitting: Family Medicine

## 2023-10-24 DIAGNOSIS — E1149 Type 2 diabetes mellitus with other diabetic neurological complication: Secondary | ICD-10-CM

## 2023-10-24 DIAGNOSIS — I110 Hypertensive heart disease with heart failure: Secondary | ICD-10-CM

## 2023-10-24 DIAGNOSIS — M79644 Pain in right finger(s): Secondary | ICD-10-CM | POA: Diagnosis not present

## 2023-10-24 DIAGNOSIS — E1122 Type 2 diabetes mellitus with diabetic chronic kidney disease: Secondary | ICD-10-CM

## 2023-10-26 ENCOUNTER — Other Ambulatory Visit: Payer: Self-pay

## 2023-10-26 ENCOUNTER — Ambulatory Visit: Payer: 59 | Admitting: Family Medicine

## 2023-10-31 DIAGNOSIS — M79644 Pain in right finger(s): Secondary | ICD-10-CM | POA: Diagnosis not present

## 2023-11-08 ENCOUNTER — Ambulatory Visit: Admitting: Family Medicine

## 2023-11-17 DIAGNOSIS — M79644 Pain in right finger(s): Secondary | ICD-10-CM | POA: Diagnosis not present

## 2023-11-22 DIAGNOSIS — S62356A Nondisplaced fracture of shaft of fifth metacarpal bone, right hand, initial encounter for closed fracture: Secondary | ICD-10-CM | POA: Diagnosis not present

## 2023-11-22 DIAGNOSIS — M79644 Pain in right finger(s): Secondary | ICD-10-CM | POA: Diagnosis not present

## 2023-11-23 ENCOUNTER — Other Ambulatory Visit (HOSPITAL_COMMUNITY): Payer: Self-pay | Admitting: Family Medicine

## 2023-11-23 ENCOUNTER — Other Ambulatory Visit: Payer: Self-pay | Admitting: Family Medicine

## 2023-11-23 DIAGNOSIS — G4733 Obstructive sleep apnea (adult) (pediatric): Secondary | ICD-10-CM

## 2023-11-23 DIAGNOSIS — I5032 Chronic diastolic (congestive) heart failure: Secondary | ICD-10-CM

## 2023-12-02 DIAGNOSIS — M79644 Pain in right finger(s): Secondary | ICD-10-CM | POA: Diagnosis not present

## 2023-12-09 DIAGNOSIS — M79644 Pain in right finger(s): Secondary | ICD-10-CM | POA: Diagnosis not present

## 2023-12-28 ENCOUNTER — Other Ambulatory Visit (HOSPITAL_COMMUNITY): Payer: Self-pay | Admitting: Family Medicine

## 2024-01-23 ENCOUNTER — Telehealth: Payer: Self-pay | Admitting: Family Medicine

## 2024-01-23 NOTE — Telephone Encounter (Signed)
Contacted pt confirmed appt

## 2024-01-24 ENCOUNTER — Ambulatory Visit: Attending: Family Medicine | Admitting: Family Medicine

## 2024-01-24 ENCOUNTER — Encounter: Payer: Self-pay | Admitting: Family Medicine

## 2024-01-24 ENCOUNTER — Other Ambulatory Visit: Payer: Self-pay | Admitting: Family Medicine

## 2024-01-24 VITALS — BP 133/85 | HR 73 | Wt 190.0 lb

## 2024-01-24 DIAGNOSIS — I5032 Chronic diastolic (congestive) heart failure: Secondary | ICD-10-CM

## 2024-01-24 DIAGNOSIS — I110 Hypertensive heart disease with heart failure: Secondary | ICD-10-CM

## 2024-01-24 DIAGNOSIS — E0500 Thyrotoxicosis with diffuse goiter without thyrotoxic crisis or storm: Secondary | ICD-10-CM

## 2024-01-24 DIAGNOSIS — I129 Hypertensive chronic kidney disease with stage 1 through stage 4 chronic kidney disease, or unspecified chronic kidney disease: Secondary | ICD-10-CM | POA: Diagnosis not present

## 2024-01-24 DIAGNOSIS — K529 Noninfective gastroenteritis and colitis, unspecified: Secondary | ICD-10-CM

## 2024-01-24 DIAGNOSIS — Z794 Long term (current) use of insulin: Secondary | ICD-10-CM | POA: Diagnosis not present

## 2024-01-24 DIAGNOSIS — E1122 Type 2 diabetes mellitus with diabetic chronic kidney disease: Secondary | ICD-10-CM

## 2024-01-24 DIAGNOSIS — N3946 Mixed incontinence: Secondary | ICD-10-CM

## 2024-01-24 DIAGNOSIS — E1149 Type 2 diabetes mellitus with other diabetic neurological complication: Secondary | ICD-10-CM | POA: Diagnosis not present

## 2024-01-24 LAB — POCT GLYCOSYLATED HEMOGLOBIN (HGB A1C): HbA1c, POC (controlled diabetic range): 6.4 % (ref 0.0–7.0)

## 2024-01-24 MED ORDER — PREGABALIN 75 MG PO CAPS: 75.0000 mg | ORAL_CAPSULE | Freq: Two times a day (BID) | ORAL | 1 refills | Status: DC

## 2024-01-24 NOTE — Patient Instructions (Signed)
 VISIT SUMMARY:  Today, we discussed your ongoing issues with chronic diarrhea, diabetes management, and other health concerns. We reviewed your current medications and made some recommendations for further tests and specialist referrals.  YOUR PLAN:  -CHRONIC DIARRHEA: Chronic diarrhea can be caused by various factors, including possible pancreatic insufficiency related to diabetes. We recommend avoiding trigger foods like gravy, completing a stool test for pancreatic insufficiency, and following up with a GI specialist at [facility].  -TYPE 2 DIABETES MELLITUS: Your diabetes is well-controlled with an A1c of 6.4. Continue taking your current dose of Ozempic . We will check your A1c today to ensure it remains well-managed.  -DIABETIC NEUROPATHY: Diabetic neuropathy is nerve damage caused by diabetes. Continue taking Lyrica  as prescribed to manage this condition.  -HYPERTENSION: Your high blood pressure is well-controlled with your current medications. Please continue taking them as prescribed.  -DIASTOLIC CONGESTIVE HEART FAILURE: This condition means your heart does not relax properly between beats. Continue following the treatment plan provided by your heart failure clinic.  -STAGE III CHRONIC KIDNEY DISEASE: This stage of kidney disease indicates moderate loss of kidney function. Please schedule a follow-up appointment with [facility] to monitor and manage this condition.  -GENERAL HEALTH MAINTENANCE: You have not had a colonoscopy yet. We recommend scheduling one after your GI evaluation. Try to incorporate morning exercise to avoid heat and dehydration, and remember your upcoming eye appointment on July 17th.  INSTRUCTIONS:  Please complete the stool test for pancreatic insufficiency as soon as possible and follow up with the GI specialist at [facility]. Schedule an appointment with [facility] for your kidney disease. We will inform you of your A1c results  before you leave today. Your next follow-up appointment is in six months.

## 2024-01-24 NOTE — Progress Notes (Signed)
 Subjective:  Patient ID: [name] [name], female    DOB: April 16, 1961  Age: 63 y.o. MRN: [id number]  CC: Medical Management of Chronic Issues (Problems with bowel, using the bathroom before she can get to there )     Discussed the use of AI scribe software for clinical note transcription with the patient, who gave verbal consent to proceed.  History of Present Illness [name] [name] is a 63 year old female with  a history of  type 2 diabetes mellitus , diabetic neuropathy, hypertension, diabetic retinopathy, hyperlipidemia, hepatitis C (completed treatment with harvoni), OSA, diastolic CHF (EF 60 to 65% from echo of 09/2021), stage III CKD, hyperthyroidism and chronic diarrhea who presents for evaluation of her bowel issues.  She experiences chronic diarrhea, worsened by certain foods like gravy, leading to frequent bowel movements. She uses Imodium  and wears protective undergarments for management. She has not been taking dicyclomine  and has not had a colonoscopy this year.  I had referred her to GI last year and note in her chart indicate they had been unsuccessful in reaching her to schedule an appointment.  She has diabetes, managed with weekly Ozempic , with a last A1c of 6.6 in December.  And 6.4 today.   She has congestive heart failure, treated with carvedilol , hydralazine , spironolactone , and torsemide , with no current shortness of breath. She missed her blood pressure medication on the day of the visit due to rushing.  She has neuropathy, treated with Lyrica , and also takes amlodipine  and atorvastatin . She engages in limited physical activity, mainly walking when shopping. Her late-night movie watching affects her sleep schedule. She does have Graves' disease for which she had been seen by endocrine at [facility] but has not been seen recently.   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   Qualifier: Diagnosis of  By: Lelon RIGGERS, Scott     Hypertension    MRSA (methicillin resistant Staphylococcus aureus)    Trichomonas    VAGINITIS, BACTERIAL, RECURRENT 08/15/2007   Qualifier: Diagnosis of  By: Loretha MD, [place]      Past Surgical History:  Procedure Laterality Date   BREAST BIOPSY Left 2018   BREAST BIOPSY Left 06/08/2022   BREAST BIOPSY Right 06/08/2022   BREAST BIOPSY Right 06/08/2022   US  RT BREAST BX W LOC DEV 1ST LESION IMG BX SPEC US  GUIDE 06/08/2022 [facility]   BREAST BIOPSY Left 06/08/2022   US  LT BREAST BX W LOC DEV 1ST LESION IMG BX SPEC US  GUIDE 06/08/2022 [facility]   BREAST BIOPSY  12/01/2022   MM LT RADIOACTIVE SEED LOC MAMMO GUIDE 12/01/2022 [facility]   BREAST LUMPECTOMY WITH RADIOACTIVE SEED LOCALIZATION Left 12/02/2022   Procedure: LEFT BREAST LUMPECTOMY WITH RADIOACTIVE SEED LOCALIZATION;  Surgeon: Vanderbilt Ned, MD;  Location: [facility] OR;  Service: General;  Laterality: Left;   CESAREAN SECTION     breech   OPEN REDUCTION INTERNAL FIXATION (ORIF) METACARPAL Right 08/17/2023   Procedure: OPEN REDUCTION INTERNAL FIXATION (ORIF)  FIFTH METACARPAL;  Surgeon: Romona Harari, MD;  Location:  SURGERY CENTER;  Service: Orthopedics;  Laterality: Right;  regional    Family History  Problem Relation Age of Onset   Colon cancer Mother    Liver disease Sister    Other Neg Hx  Breast cancer Neg Hx    Esophageal cancer Neg Hx    Rectal cancer Neg Hx    Tremor Neg Hx     Social History   Socioeconomic History   Marital status: Legally Separated    Spouse name: Not on file   Number of children: 1   Years of education: Not on file   Highest education level: 9th grade  Occupational History   Occupation: disability  Tobacco Use   Smoking status: Never   Smokeless tobacco: Never  Vaping Use   Vaping status: Never Used  Substance and Sexual  Activity   Alcohol use: No   Drug use: Not Currently    Types: Cocaine, Marijuana    Comment: remote h/o cocaine 2015 and marijuana 2018   Sexual activity: Yes  Other Topics Concern   Not on file  Social History Narrative   Lives with friend, Hildegarde and nephew.  One daughter.    Social Drivers of Corporate investment banker Strain: Low Risk  (07/22/2022)   Overall Financial Resource Strain (CARDIA)    Difficulty of Paying Living Expenses: Not very hard  Food Insecurity: No Food Insecurity (02/23/2023)   Hunger Vital Sign    Worried About Running Out of Food in the Last Year: Never true    Ran Out of Food in the Last Year: Never true  Transportation Needs: No Transportation Needs (02/23/2023)   PRAPARE - Administrator, Civil Service (Medical): No    Lack of Transportation (Non-Medical): No  Physical Activity: Inactive (02/23/2023)   Exercise Vital Sign    Days of Exercise per Week: 0 days    Minutes of Exercise per Session: 0 min  Stress: No Stress Concern Present (02/23/2023)   [employer] of Occupational Health - Occupational Stress Questionnaire    Feeling of Stress : Not at all  Social Connections: Moderately Isolated (02/23/2023)   Social Connection and Isolation Panel    Frequency of Communication with Friends and Family: More than three times a week    Frequency of Social Gatherings with Friends and Family: Twice a week    Attends Religious Services: More than 4 times per year    Active Member of [employer] or Organizations: No    Attends Banker Meetings: Never    Marital Status: Divorced    Allergies  Allergen Reactions   Lisinopril  Swelling    Outpatient Medications Prior to Visit  Medication Sig Dispense Refill   albuterol  (VENTOLIN  HFA) 108 (90 Base) MCG/ACT inhaler Inhale 2 puffs into the lungs every 4 (four) hours as needed for wheezing or shortness of breath. 1 each 0   amLODipine  (NORVASC ) 2.5 MG tablet Take 1 Tablet by mouth once daily  (morning) 90 tablet 1   atorvastatin  (LIPITOR) 40 MG tablet take 1 Tablet by mouth in the morning (morning) 90 tablet 1   benzonatate  (TESSALON ) 100 MG capsule Take 1 capsule (100 mg total) by mouth every 8 (eight) hours. 15 capsule 0   carboxymethylcellul-glycerin  (LUBRICATING EYE DROPS) 0.5-0.9 % ophthalmic solution Place 1 drop into both eyes daily as needed (dry eyes). 10 mL 0   carvedilol  (COREG ) 25 MG tablet take 1 Tablet by mouth twice daily (morning, evening) 180 tablet 3   dicyclomine  (BENTYL ) 10 MG capsule Take 1 capsule (10 mg total) by mouth 3 (three) times daily before meals. 90 capsule 3   hydrALAZINE  (APRESOLINE ) 25 MG tablet TAKE 1 Tablet BY MOUTH THREE TIMES DAILY (morning, noon,  evening) 270 tablet 3   hydrALAZINE  (APRESOLINE ) 50 MG tablet TAKE 1 Tablet BY MOUTH THREE TIMES DAILY take along with 25mg  DOSE (morning, noon, evening) 135 tablet 8   JARDIANCE  10 MG TABS tablet Take 1 Tablet by mouth once daily (morning) 90 tablet 1   promethazine -dextromethorphan  (PROMETHAZINE -DM) 6.25-15 MG/5ML syrup Take 5 mLs by mouth 4 (four) times daily as needed for cough. 118 mL 0   Semaglutide , 1 MG/DOSE, (OZEMPIC , 1 MG/DOSE,) 4 MG/3ML SOPN Inject 1 mg into the skin once a week. 9 mL 1   spironolactone  (ALDACTONE ) 25 MG tablet TAKE 1/2 TABLET BY MOUTH EVERY DAY (morning) 30 tablet 0   torsemide  (DEMADEX ) 20 MG tablet Take 1 Tablet by mouth once daily (morning) 90 tablet 8   famotidine  (PEPCID ) 40 MG tablet Take 1 tablet (40 mg total) by mouth daily. 30 tablet 0   oxybutynin  (DITROPAN ) 5 MG tablet Take 1 tablet (5 mg total) by mouth 2 (two) times daily. 180 tablet 1   pregabalin  (LYRICA ) 75 MG capsule Take 1 capsule (75 mg total) by mouth 2 (two) times daily. 180 capsule 1   No facility-administered medications prior to visit.     ROS Review of Systems  Constitutional:  Negative for activity change and appetite change.  HENT:  Negative for sinus pressure and sore throat.   Respiratory:   Negative for chest tightness, shortness of breath and wheezing.   Cardiovascular:  Negative for chest pain and palpitations.  Gastrointestinal:  Positive for diarrhea. Negative for abdominal distention, abdominal pain and constipation.  Genitourinary: Negative.   Musculoskeletal: Negative.   Psychiatric/Behavioral:  Negative for behavioral problems and dysphoric mood.     Objective:  BP 133/85 (BP Location: Left Arm, Patient Position: Sitting, Cuff Size: Normal)   Pulse 73   Wt 190 lb (86.2 kg)   LMP 04/01/2010   SpO2 97%   BMI 32.61 kg/m      01/24/2024    1:39 PM 08/17/2023    3:15 PM 08/17/2023    2:33 PM  BP/Weight  Systolic BP 133 128 132  Diastolic BP 85 70 77  Wt. (Lbs) 190    BMI 32.61 kg/m2        Physical Exam Constitutional:      Appearance: She is well-developed.  Cardiovascular:     Rate and Rhythm: Normal rate.     Heart sounds: Normal heart sounds. No murmur heard. Pulmonary:     Effort: Pulmonary effort is normal.     Breath sounds: Normal breath sounds. No wheezing or rales.  Chest:     Chest wall: No tenderness.  Abdominal:     General: Bowel sounds are normal. There is no distension.     Palpations: Abdomen is soft. There is no mass.     Tenderness: There is no abdominal tenderness.  Musculoskeletal:        General: Normal range of motion.     Right lower leg: No edema.     Left lower leg: No edema.  Neurological:     Mental Status: She is alert and oriented to person, place, and time.  Psychiatric:        Mood and Affect: Mood normal.        Latest Ref Rng & Units 08/16/2023   11:00 AM 07/05/2023   12:00 PM 01/20/2023    7:58 AM  CMP  Glucose 70 - 99 mg/dL 872  892  876   BUN 8 - 23 mg/dL 21  23  35   Creatinine 0.44 - 1.00 mg/dL 7.93  8.36  7.66   Sodium 135 - 145 mmol/L 143  143  139   Potassium 3.5 - 5.1 mmol/L 3.3  3.8  3.4   Chloride 98 - 111 mmol/L 107  108  103   CO2 22 - 32 mmol/L 26  18  22    Calcium  8.9 - 10.3 mg/dL 8.9  9.0   8.9   Total Protein 6.5 - 8.1 g/dL 7.0  7.2    Total Bilirubin 0.0 - 1.2 mg/dL 1.0  0.5    Alkaline Phos 38 - 126 U/L 73  99    AST 15 - 41 U/L 12  13    ALT 0 - 44 U/L 9  8      Lipid Panel     Component Value Date/Time   CHOL 148 09/06/2022 1135   TRIG 77 09/06/2022 1135   HDL 78 09/06/2022 1135   CHOLHDL 2.5 01/29/2019 1118   CHOLHDL 3 06/18/2014 0951   VLDL 19.6 06/18/2014 0951   LDLCALC 55 09/06/2022 1135    CBC    Component Value Date/Time   WBC 3.7 (L) 01/20/2023 0758   RBC 3.53 (L) 01/20/2023 0758   HGB 10.3 (L) 01/20/2023 0758   HGB 9.8 (L) 07/10/2019 1544   HCT 32.7 (L) 01/20/2023 0758   HCT 30.9 (L) 07/10/2019 1544   PLT 127 (L) 01/20/2023 0758   PLT 171 07/10/2019 1544   MCV 92.6 01/20/2023 0758   MCV 86 07/10/2019 1544   MCH 29.2 01/20/2023 0758   MCHC 31.5 01/20/2023 0758   RDW 13.0 01/20/2023 0758   RDW 12.7 07/10/2019 1544   LYMPHSABS 1.0 09/22/2021 1209   LYMPHSABS 1.3 07/10/2019 1544   MONOABS 0.4 09/22/2021 1209   EOSABS 0.0 09/22/2021 1209   EOSABS 0.0 07/10/2019 1544   BASOSABS 0.0 09/22/2021 1209   BASOSABS 0.0 07/10/2019 1544    Lab Results  Component Value Date   HGBA1C 6.4 01/24/2024       Assessment & Plan Chronic Diarrhea Persistent diarrhea . Need to exclude pancreatic insufficiency due to diabetes. Exacerbated by certain foods. Previous GI referral not completed. - Refer to GI specialist at [facility]. - Order stool test for pancreatic insufficiency. - Advise avoidance of trigger foods like gravy. - Encourage prompt completion of stool test.  Type 2 Diabetes Mellitus A1c at 6.4 indicates good control. Continue current Ozempic  dose. - Continue current dose of Ozempic .,  Jardiance  - Check A1c today. Counseled on Diabetic diet, the healthy plate, 849 minutes of moderate intensity exercise/week Blood sugar logs with fasting goals of 80-120 mg/dl, random of less than 819 and in the event of sugars less than 60 mg/dl or  greater than 599 mg/dl encouraged to notify the clinic. Advised on the need for annual eye exams, annual foot exams, Pneumonia vaccine.   Diabetic Neuropathy Stable Managed with Lyrica .  Hypertension associated with chronic kidney disease secondary to type 2 diabetes mellitus Well-controlled on current medications despite missed dose. Avoid nephrotoxins Continue to follow-up with [facility]  Hypertensive heart disease with diastolic heart failure Euvolemic, EF of 60-65 from echo 09/2021 Continue GDMT Managed by heart failure clinic.    Graves' disease Currently not on medications Management per endocrine   General Health Maintenance No colonoscopy. Limited exercise. Eye appointment scheduled. - Encourage scheduling of colonoscopy after GI evaluation. - Advise morning exercise to avoid heat and dehydration. - Remind of upcoming eye  appointment on July 17th.  Follow-up Schedule follow-up appointment in six months.    Meds ordered this encounter  Medications   pregabalin  (LYRICA ) 75 MG capsule    Sig: Take 1 capsule (75 mg total) by mouth 2 (two) times daily.    Dispense:  180 capsule    Refill:  1    This prescription was filled on 05/12/2023. Any refills authorized will be placed on file.    Follow-up: Return in about 6 months (around 07/26/2024) for Chronic medical conditions.       Corrina Sabin, MD, FAAFP. [facility] [place], [place] [phone]   01/24/2024, 6:50 PM

## 2024-01-26 LAB — MICROALBUMIN / CREATININE URINE RATIO
Creatinine, Urine: 133.3 mg/dL
Microalb/Creat Ratio: 90 mg/g{creat} — ABNORMAL HIGH (ref 0–29)
Microalbumin, Urine: 120.2 ug/mL

## 2024-01-27 ENCOUNTER — Ambulatory Visit: Payer: Self-pay | Admitting: Family Medicine

## 2024-02-02 DIAGNOSIS — Z961 Presence of intraocular lens: Secondary | ICD-10-CM | POA: Diagnosis not present

## 2024-02-02 DIAGNOSIS — H4313 Vitreous hemorrhage, bilateral: Secondary | ICD-10-CM | POA: Diagnosis not present

## 2024-02-02 DIAGNOSIS — E113513 Type 2 diabetes mellitus with proliferative diabetic retinopathy with macular edema, bilateral: Secondary | ICD-10-CM | POA: Diagnosis not present

## 2024-02-03 ENCOUNTER — Encounter: Payer: Self-pay | Admitting: Physician Assistant

## 2024-02-29 ENCOUNTER — Other Ambulatory Visit: Payer: Self-pay | Admitting: Family Medicine

## 2024-02-29 ENCOUNTER — Ambulatory Visit: Payer: 59 | Attending: Family Medicine

## 2024-02-29 ENCOUNTER — Other Ambulatory Visit (HOSPITAL_COMMUNITY): Payer: Self-pay | Admitting: Family Medicine

## 2024-03-22 DIAGNOSIS — H35353 Cystoid macular degeneration, bilateral: Secondary | ICD-10-CM | POA: Diagnosis not present

## 2024-03-22 DIAGNOSIS — E113513 Type 2 diabetes mellitus with proliferative diabetic retinopathy with macular edema, bilateral: Secondary | ICD-10-CM | POA: Diagnosis not present

## 2024-03-27 NOTE — Progress Notes (Unsigned)
 Ellouise Console, PA-C [address] [place], [place]  [address] Phone: [phone]   Gastroenterology Consultation  Referring Provider:     Delbert Clam, MD Primary Care Physician:  Delbert Clam, MD Primary Gastroenterologist:  Ellouise Console, PA-C / Norleen Kiang, MD  Reason for Consultation:     Chronic Diarrhea        HPI:   [name] [name] is a 63 y.o. y/o female referred for consultation & management  by Delbert Clam, MD.    Current symptoms:  09/2018 colonoscopy by Dr. Kiang: Normal.  No polyps or biopsies.  5-year repeat due to family history of mother With colon cancer under age 72.  5-year repeat colonoscopy was due 09/2023.  09/2018 EGD: Normal.  No biopsies.  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  PMH: COPD, CHF, CVD, sleep apnea, chronic hepatitis C, diabetes, hypertension, GERD, diabetic neuropathy, Graves' disease, obesity, anemia, Hx substance abuse.  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   Qualifier: Diagnosis of  By: Lelon RIGGERS, Scott     Hypertension    MRSA (methicillin resistant Staphylococcus aureus)    Trichomonas    VAGINITIS, BACTERIAL, RECURRENT 08/15/2007   Qualifier: Diagnosis of  By: Loretha MD, [place]      Past Surgical History:  Procedure Laterality Date   BREAST BIOPSY Left 2018   BREAST BIOPSY Left 06/08/2022   BREAST BIOPSY Right 06/08/2022   BREAST BIOPSY Right 06/08/2022   US  RT BREAST BX W LOC DEV 1ST LESION IMG BX SPEC US  GUIDE 06/08/2022 [facility]   BREAST BIOPSY Left 06/08/2022   US  LT BREAST BX W LOC DEV 1ST LESION IMG BX SPEC US  GUIDE 06/08/2022 [facility]   BREAST BIOPSY  12/01/2022   MM LT RADIOACTIVE SEED LOC MAMMO GUIDE 12/01/2022 [facility]   BREAST LUMPECTOMY WITH RADIOACTIVE SEED LOCALIZATION Left  12/02/2022   Procedure: LEFT BREAST LUMPECTOMY WITH RADIOACTIVE SEED LOCALIZATION;  Surgeon: Vanderbilt Ned, MD;  Location: [facility];  Service: General;  Laterality: Left;   CESAREAN SECTION     breech   OPEN REDUCTION INTERNAL FIXATION (ORIF) METACARPAL Right 08/17/2023   Procedure: OPEN REDUCTION INTERNAL FIXATION (ORIF)  FIFTH METACARPAL;  Surgeon: Romona Harari, MD;  Location: [facility];  Service: Orthopedics;  Laterality: Right;  regional    Prior to Admission medications   Medication Sig Start Date End Date Taking? Authorizing Provider  albuterol  (VENTOLIN  HFA) 108 (90 Base) MCG/ACT inhaler Inhale 2 puffs into the lungs every 4 (four) hours as needed for wheezing or shortness of breath. 12/01/22   Newlin, Enobong, MD  amLODipine  (NORVASC ) 2.5 MG tablet Take 1 Tablet by mouth once daily (morning) 10/26/23   Milford, Harlene HERO, FNP  atorvastatin  (LIPITOR) 40 MG tablet take 1 Tablet by mouth in the morning (morning) 10/26/23   Milford, Harlene HERO, FNP  benzonatate  (TESSALON ) 100 MG capsule Take 1 capsule (100 mg total) by mouth every 8 (eight) hours. 06/04/23   Geiple, Joshua, PA-C  carboxymethylcellul-glycerin  (LUBRICATING EYE DROPS) 0.5-0.9 % ophthalmic solution Place 1 drop into both eyes daily as needed (dry eyes). 12/01/22   Newlin, Enobong, MD  carvedilol  (COREG ) 25 MG tablet take 1 Tablet by mouth twice daily (morning, evening) 10/26/23   Milford, Harlene HERO, FNP  dicyclomine  (BENTYL ) 10 MG capsule Take 1 capsule (10 mg total) by mouth 3 (three) times daily before meals. 04/05/23   Newlin, Enobong, MD  famotidine  (PEPCID ) 40 MG tablet TAKE 1 Tablet BY MOUTH ONCE DAILY (evening) 02/29/24   Newlin, Enobong, MD  hydrALAZINE  (APRESOLINE ) 25 MG tablet TAKE 1 Tablet BY MOUTH THREE TIMES DAILY (morning, noon, evening) 10/26/23   Milford, Harlene HERO, FNP  hydrALAZINE  (APRESOLINE ) 50 MG tablet TAKE 1 Tablet BY MOUTH THREE TIMES DAILY take along with 25mg  DOSE (morning, noon, evening) 11/25/23    Milford, Harlene HERO, FNP  JARDIANCE  10 MG TABS tablet Take 1 Tablet by mouth once daily (morning) 10/26/23   Milford, Harlene HERO, FNP  oxybutynin  (DITROPAN ) 5 MG tablet TAKE 1 Tablet BY MOUTH TWICE DAILY (MORNING, evening) 01/24/24   Delbert Clam, MD  pregabalin  (LYRICA ) 75 MG capsule Take 1 capsule (75 mg total) by mouth 2 (two) times daily. 01/24/24   Newlin, Enobong, MD  promethazine -dextromethorphan  (PROMETHAZINE -DM) 6.25-15 MG/5ML syrup Take 5 mLs by mouth 4 (four) times daily as needed for cough. 06/04/23   Desiderio Chew, PA-C  Semaglutide , 1 MG/DOSE, (OZEMPIC , 1 MG/DOSE,) 4 MG/3ML SOPN Inject 1 mg into the skin once a week. 07/05/23   Newlin, Enobong, MD  spironolactone  (ALDACTONE ) 25 MG tablet Take 0.5 tablets (12.5 mg total) by mouth daily. PLEASE SCHEDULE APPOINTMENT FOR MORE REFILLS 02/29/24   Glena Harlene HERO, FNP  torsemide  (DEMADEX ) 20 MG tablet Take 1 Tablet by mouth once daily (morning) 11/25/23   Milford, Harlene HERO, FNP    Family History  Problem Relation Age of Onset   Colon cancer Mother    Liver disease Sister    Other Neg Hx    Breast cancer Neg Hx    Esophageal cancer Neg Hx    Rectal cancer Neg Hx    Tremor Neg Hx      Social History   Tobacco Use   Smoking status: Never   Smokeless tobacco: Never  Vaping Use   Vaping status: Never Used  Substance Use Topics   Alcohol use: No   Drug use: Not Currently    Types: Cocaine, Marijuana    Comment: remote h/o cocaine 2015 and marijuana 2018    Allergies as of 03/28/2024 - Review Complete 01/24/2024  Allergen Reaction Noted   Lisinopril  Swelling 04/20/2019    Review of Systems:    All systems reviewed and negative except where noted in HPI.   Physical Exam:  LMP 04/01/2010  Patient's last menstrual period was 04/01/2010.  General:   Alert,  Well-developed, well-nourished, pleasant and cooperative in NAD Lungs:  Respirations even and unlabored.  Clear throughout to auscultation.   No wheezes, crackles, or  rhonchi. No acute distress. Heart:  Regular rate and rhythm; no murmurs, clicks, rubs, or gallops. Abdomen:  Normal bowel sounds.  No bruits.  Soft, and non-distended without masses, hepatosplenomegaly or hernias noted.  No Tenderness.  No guarding or rebound tenderness.    Neurologic:  Alert and oriented x3;  grossly normal neurologically. Psych:  Alert and cooperative. Normal mood and affect.  Imaging Studies: No results found.  Labs: CBC    Component Value Date/Time   WBC 3.7 (L) 01/20/2023 0758   RBC 3.53 (L) 01/20/2023 0758   HGB 10.3 (L) 01/20/2023 0758   HGB 9.8 (L) 07/10/2019 1544   HCT 32.7 (L) 01/20/2023 0758   HCT 30.9 (L) 07/10/2019 1544   PLT 127 (L) 01/20/2023 0758   PLT 171 07/10/2019 1544  MCV 92.6 01/20/2023 0758   MCV 86 07/10/2019 1544    CMP     Component Value Date/Time   NA 143 08/16/2023 1100   NA 143 07/05/2023 1200   K 3.3 (L) 08/16/2023 1100   CL 107 08/16/2023 1100   CO2 26 08/16/2023 1100   GLUCOSE 127 (H) 08/16/2023 1100   BUN 21 08/16/2023 1100   BUN 23 07/05/2023 1200   CREATININE 2.06 (H) 08/16/2023 1100   CREATININE 0.88 12/12/2012 1349   CALCIUM  8.9 08/16/2023 1100   PROT 7.0 08/16/2023 1100   PROT 7.2 07/05/2023 1200   ALBUMIN 3.7 08/16/2023 1100   ALBUMIN 4.3 07/05/2023 1200   AST 12 (L) 08/16/2023 1100   ALT 9 08/16/2023 1100   ALT 15 11/18/2016 1133   ALKPHOS 73 08/16/2023 1100   BILITOT 1.0 08/16/2023 1100   BILITOT 0.5 07/05/2023 1200   GFRNONAA 27 (L) 08/16/2023 1100   GFRAA 38 (L) 11/08/2019 0435    Assessment and Plan:   [name] [name] is a 63 y.o. y/o female has been referred for   1.  Chronic diarrhea   2.  IBS -Continue dicyclomine  as needed  3.  Family history of colon cancer mother under age 61 - Repeat colonoscopy  4.  Personal history of tubulovillous adenoma (01/2017)    Follow up ***  Ellouise Console, PA-C

## 2024-03-28 ENCOUNTER — Other Ambulatory Visit (INDEPENDENT_AMBULATORY_CARE_PROVIDER_SITE_OTHER)

## 2024-03-28 ENCOUNTER — Ambulatory Visit: Admitting: Physician Assistant

## 2024-03-28 ENCOUNTER — Encounter: Payer: Self-pay | Admitting: Physician Assistant

## 2024-03-28 ENCOUNTER — Ambulatory Visit (INDEPENDENT_AMBULATORY_CARE_PROVIDER_SITE_OTHER)
Admission: RE | Admit: 2024-03-28 | Discharge: 2024-03-28 | Disposition: A | Discharge status: Home / Self Care | Source: Ambulatory Visit | Attending: Physician Assistant | Admitting: Physician Assistant

## 2024-03-28 VITALS — BP 104/62 | HR 76 | Ht 64.0 in | Wt 214.8 lb

## 2024-03-28 DIAGNOSIS — Z800 Family history of malignant neoplasm of digestive organs: Secondary | ICD-10-CM

## 2024-03-28 DIAGNOSIS — R159 Full incontinence of feces: Secondary | ICD-10-CM

## 2024-03-28 DIAGNOSIS — R197 Diarrhea, unspecified: Secondary | ICD-10-CM

## 2024-03-28 DIAGNOSIS — K580 Irritable bowel syndrome with diarrhea: Secondary | ICD-10-CM | POA: Diagnosis not present

## 2024-03-28 DIAGNOSIS — K429 Umbilical hernia without obstruction or gangrene: Secondary | ICD-10-CM | POA: Diagnosis not present

## 2024-03-28 DIAGNOSIS — Z860101 Personal history of adenomatous and serrated colon polyps: Secondary | ICD-10-CM

## 2024-03-28 DIAGNOSIS — Z860100 Personal history of colon polyps, unspecified: Secondary | ICD-10-CM

## 2024-03-28 LAB — COMPREHENSIVE METABOLIC PANEL WITH GFR
ALT: 8 U/L (ref 0–35)
AST: 9 U/L (ref 0–37)
Albumin: 4.4 g/dL (ref 3.5–5.2)
Alkaline Phosphatase: 71 U/L (ref 39–117)
BUN: 44 mg/dL — ABNORMAL HIGH (ref 6–23)
CO2: 23 meq/L (ref 19–32)
Calcium: 8.6 mg/dL (ref 8.4–10.5)
Chloride: 108 meq/L (ref 96–112)
Creatinine, Ser: 2.42 mg/dL — ABNORMAL HIGH (ref 0.40–1.20)
GFR: 20.81 mL/min — ABNORMAL LOW (ref >60.00)
Glucose, Bld: 121 mg/dL — ABNORMAL HIGH (ref 70–99)
Potassium: 3.9 meq/L (ref 3.5–5.1)
Sodium: 142 meq/L (ref 135–145)
Total Bilirubin: 0.6 mg/dL (ref 0.2–1.2)
Total Protein: 7.1 g/dL (ref 6.0–8.3)

## 2024-03-28 LAB — CBC WITH DIFFERENTIAL/PLATELET
Basophils Absolute: 0 K/uL (ref 0.0–0.1)
Basophils Relative: 0.3 % (ref 0.0–3.0)
Eosinophils Absolute: 0 K/uL (ref 0.0–0.7)
Eosinophils Relative: 0.3 % (ref 0.0–5.0)
HCT: 31.3 % — ABNORMAL LOW (ref 36.0–46.0)
Hemoglobin: 10.2 g/dL — ABNORMAL LOW (ref 12.0–15.0)
Lymphocytes Relative: 34.5 % (ref 12.0–46.0)
Lymphs Abs: 1.1 K/uL (ref 0.7–4.0)
MCHC: 32.6 g/dL (ref 30.0–36.0)
MCV: 88.2 fl (ref 78.0–100.0)
Monocytes Absolute: 0.3 K/uL (ref 0.1–1.0)
Monocytes Relative: 8.6 % (ref 3.0–12.0)
Neutro Abs: 1.8 K/uL (ref 1.4–7.7)
Neutrophils Relative %: 56.3 % (ref 43.0–77.0)
Platelets: 134 K/uL — ABNORMAL LOW (ref 150.0–400.0)
RBC: 3.54 Mil/uL — ABNORMAL LOW (ref 3.87–5.11)
RDW: 15.5 % (ref 11.5–15.5)
WBC: 3.3 K/uL — ABNORMAL LOW (ref 4.0–10.5)

## 2024-03-28 LAB — C-REACTIVE PROTEIN: CRP: <1 mg/dL (ref 0.5–20.0)

## 2024-03-28 MED ORDER — NA SULFATE-K SULFATE-MG SULF 17.5-3.13-1.6 GM/177ML PO SOLN: 1.0000 | Freq: Once | ORAL | 0 refills | Status: AC

## 2024-03-28 NOTE — Progress Notes (Signed)
 Noted

## 2024-03-28 NOTE — Patient Instructions (Addendum)
 Your provider has requested that you go to the basement level for lab work before leaving today. Press B on the elevator. The lab is located at the first door on the left as you exit the elevator.  Your provider has requested that you go to the basement level for an X-ray before leaving today. Press B on the elevator. The Radiology/X-ray department is located at the second door on the right as you exit straight off the elevator.  Your provider has ordered Diatherix stool testing for you. You have received a kit from our office today containing all necessary supplies to complete this test. Please carefully read the stool collection instructions provided in the kit before opening the accompanying materials. In addition, be sure there is a label providing your full name and date of birth on the puritan opti-swab tube that is supplied in the kit (if you do not see a label with this information on your test tube, please make us  aware before test collection!). After completing the test, you should secure the purtian tube into the specimen biohazard bag. The [facility] E-Req sheet (including date and time of specimen collection) should be placed into the outside pocket of the specimen biohazard bag and returned to the [facility] lab (basement floor of [facility]) within 3 days of collection. Please make sure to give the specimen to a staff member at the lab. DO NOT leave the specimen on the counter.   If the specimen date and time (can be found in the upper right boxed portion of the sheet) are not filled out on the E-Req sheet, the test will NOT be performed.   You have been scheduled for a Colonoscopy. Please follow written instructions given to you at your visit today.   If you use inhalers (even only as needed), please bring them with you on the day of your procedure.  DO NOT TAKE 7 DAYS PRIOR TO TEST- Trulicity (dulaglutide) Ozempic , Wegovy  (semaglutide ) Mounjaro  (tirzepatide) Bydureon  Bcise (exanatide extended release)  DO NOT TAKE 1 DAY PRIOR TO YOUR TEST Rybelsus  (semaglutide ) Adlyxin (lixisenatide) Victoza  (liraglutide ) Byetta  (exanatide) ___________________________________________________________________________  Please follow up sooner if symptoms increase or worsen   Due to recent changes in healthcare laws, you may see the results of your imaging and laboratory studies on MyChart before your provider has had a chance to review them.  We understand that in some cases there may be results that are confusing or concerning to you. Not all laboratory results come back in the same time frame and the provider may be waiting for multiple results in order to interpret others.  Please give us  48 hours in order for your provider to thoroughly review all the results before contacting the office for clarification of your results.   Thank you for trusting me with your gastrointestinal care!   Ellouise Console, PA-C _______________________________________________________  If your blood pressure at your visit was 140/90 or greater, please contact your primary care physician to follow up on this.  _______________________________________________________  If you are age 53 or older, your body mass index should be between 23-30. Your Body mass index is 36.87 kg/m. If this is out of the aforementioned range listed, please consider follow up with your Primary Care Provider.  If you are age 19 or younger, your body mass index should be between 19-25. Your Body mass index is 36.87 kg/m. If this is out of the aformentioned range listed, please consider follow up with your Primary  Care Provider.   ________________________________________________________  The [facility] providers would like to encourage you to use MYCHART to communicate with providers for non-urgent requests or questions.  Due to long hold times on the telephone, sending your provider a message by  [facility] may be a faster and more efficient way to get a response.  Please allow 48 business hours for a response.  Please remember that this is for non-urgent requests.  _______________________________________________________

## 2024-03-30 LAB — CELIAC DISEASE AB SCREEN W/RFX
Antigliadin Abs, IgA: 5 U (ref 0–19)
IgA/Immunoglobulin A, Serum: 182 mg/dL (ref 87–352)
Transglutaminase IgA: <2 U/mL (ref 0–3)

## 2024-04-02 ENCOUNTER — Ambulatory Visit: Payer: Self-pay | Admitting: Physician Assistant

## 2024-04-02 DIAGNOSIS — D649 Anemia, unspecified: Secondary | ICD-10-CM

## 2024-04-02 NOTE — Progress Notes (Signed)
 Call and notify patient labs show: Worsening Kidney function since last labs done January 2025.  Patient needs to followup with PCP and nephrologist. Mild elevated glucose 121. Normal Liver Tests and Electrolytes. Stable Mild low white count. Chronic stable anemia (Low Hemoglobin).  Suspect Anemia of Chronic Kidney disease. Celiac labs are normal.  No evidence of celiac disease or gluten allergy.  Normal CRP.  No evidence of inflammation.   I Recommend: Drink 64 ounces of water daily to prevent dehydration.  Avoid NSAIDS. Please check on Diatherix Stool test that was collected in office 03/28/24. Call lab and add on Iron  Panal, Ferritin, Vitamin B12 and Folate (Dx Anemia).  If labs cannot be added, then schedule lab visit. Have patient followup with her PCP and Nephrologist for worsening kidney function. Patient is scheduled for Colonoscopy with Dr. Abran on 05/02/24.  If she has Iron  deficiency anemia, then we may need to add on EGD.   [name] Console, PA-C  [name] Console, PA-C

## 2024-04-16 ENCOUNTER — Other Ambulatory Visit: Payer: Self-pay | Admitting: Family Medicine

## 2024-04-16 DIAGNOSIS — Z7985 Long-term (current) use of injectable non-insulin antidiabetic drugs: Secondary | ICD-10-CM

## 2024-04-16 DIAGNOSIS — E1149 Type 2 diabetes mellitus with other diabetic neurological complication: Secondary | ICD-10-CM

## 2024-05-02 ENCOUNTER — Ambulatory Visit: Admitting: Internal Medicine

## 2024-05-02 ENCOUNTER — Encounter: Payer: Self-pay | Admitting: Internal Medicine

## 2024-05-02 VITALS — BP 151/76 | HR 72 | Temp 97.2°F | Resp 12 | Ht 64.0 in | Wt 214.0 lb

## 2024-05-02 DIAGNOSIS — Z860101 Personal history of adenomatous and serrated colon polyps: Secondary | ICD-10-CM | POA: Diagnosis not present

## 2024-05-02 DIAGNOSIS — K529 Noninfective gastroenteritis and colitis, unspecified: Secondary | ICD-10-CM | POA: Diagnosis not present

## 2024-05-02 DIAGNOSIS — I10 Essential (primary) hypertension: Secondary | ICD-10-CM | POA: Diagnosis not present

## 2024-05-02 DIAGNOSIS — Z800 Family history of malignant neoplasm of digestive organs: Secondary | ICD-10-CM

## 2024-05-02 DIAGNOSIS — R197 Diarrhea, unspecified: Secondary | ICD-10-CM | POA: Diagnosis not present

## 2024-05-02 DIAGNOSIS — E119 Type 2 diabetes mellitus without complications: Secondary | ICD-10-CM | POA: Diagnosis not present

## 2024-05-02 DIAGNOSIS — I509 Heart failure, unspecified: Secondary | ICD-10-CM | POA: Diagnosis not present

## 2024-05-02 DIAGNOSIS — Z860100 Personal history of colon polyps, unspecified: Secondary | ICD-10-CM

## 2024-05-02 MED ORDER — SODIUM CHLORIDE 0.9 % IV SOLN: 500.0000 mL | Freq: Once | INTRAVENOUS | Status: DC

## 2024-05-02 NOTE — Op Note (Signed)
 [facility] Patient Name: [name] [name] Procedure Date: 05/02/2024 8:56 AM MRN: [id number] Endoscopist: Norleen SAILOR. Abran , MD, [id number] Age: 63 Referring MD:  Date of Birth: 1960-11-10 Gender: Female Account #: [id number] Procedure:                Colonoscopy with biopsies Indications:              Chronic diarrhea. Family history of colon cancer in                            mother less than age 46. Personal history of                            advanced adenoma (tubulovillous adenoma 2018). Last                            colonoscopy 2020 was normal. Medicines:                Monitored Anesthesia Care Procedure:                Pre-Anesthesia Assessment:                           - Prior to the procedure, a History and Physical                            was performed, and patient medications and                            allergies were reviewed. The patient's tolerance of                            previous anesthesia was also reviewed. The risks                            and benefits of the procedure and the sedation                            options and risks were discussed with the patient.                            All questions were answered, and informed consent                            was obtained. Prior Anticoagulants: The patient has                            taken no anticoagulant or antiplatelet agents. ASA                            Grade Assessment: III - A patient with severe                            systemic disease. After reviewing the risks and  benefits, the patient was deemed in satisfactory                            condition to undergo the procedure.                           After obtaining informed consent, the colonoscope                            was passed under direct vision. Throughout the                            procedure, the patient's blood pressure, pulse, and                            oxygen   saturations were monitored continuously. The                            CF [id number] #[id number] was introduced through the anus                            and advanced to the the cecum, identified by                            appendiceal orifice and ileocecal valve. The                            ileocecal valve, appendiceal orifice, and rectum                            were photographed. The quality of the bowel                            preparation was good. The colonoscopy was performed                            without difficulty. The patient tolerated the                            procedure well. The bowel preparation used was                            SUPREP via split dose instruction. Scope In: 9:11:07 AM Scope Out: 9:24:54 AM Scope Withdrawal Time: 0 hours 9 minutes 12 seconds  Total Procedure Duration: 0 hours 13 minutes 47 seconds  Findings:                 The entire examined colon appeared normal on direct                            and retroflexion views. Biopsies for histology were                            taken with a cold forceps from the entire colon for  evaluation of microscopic colitis. Complications:            No immediate complications. Estimated blood loss:                            None. Estimated Blood Loss:     Estimated blood loss: none. Impression:               - The entire examined colon is normal on direct and                            retroflexion views. Recommendation:           - Repeat colonoscopy in 5 years for surveillance                            (personal and family history).                           - Patient has a contact number available for                            emergencies. The signs and symptoms of potential                            delayed complications were discussed with the                            patient. Return to normal activities tomorrow.                            Written discharge  instructions were provided to the                            patient.                           - Resume previous diet.                           - Continue present medications.                           - Await pathology results.                           - Recommend CITRUCEL POWDER 2 TABLESPOONS daily in                            14 ounces of water or juice. This will improve the                            consistency of your bowel habits Nilton Lave N. Abran, MD 05/02/2024 9:35:57 AM This report has been signed electronically.

## 2024-05-02 NOTE — Progress Notes (Signed)
 Pt sedate, gd SR's, VSS, report to RN

## 2024-05-02 NOTE — Patient Instructions (Signed)
 Please read handouts provided. Continue present medications. Await pathology results. Resume previous diet. Repeat colonoscopy in 5 years for screening. Recommend Citrucel Powder 2 tablespoons daily in 14 ounces of water or juice. This will improve the consistency of your bowel habits.   YOU HAD AN ENDOSCOPIC PROCEDURE TODAY AT THE [facility]:   Refer to the procedure report that was given to you for any specific questions about what was found during the examination.  If the procedure report does not answer your questions, please call your gastroenterologist to clarify.  If you requested that your care partner not be given the details of your procedure findings, then the procedure report has been included in a sealed envelope for you to review at your convenience later.  YOU SHOULD EXPECT: Some feelings of bloating in the abdomen. Passage of more gas than usual.  Walking can help get rid of the air that was put into your GI tract during the procedure and reduce the bloating. If you had a lower endoscopy (such as a colonoscopy or flexible sigmoidoscopy) you may notice spotting of blood in your stool or on the toilet paper. If you underwent a bowel prep for your procedure, you may not have a normal bowel movement for a few days.  Please Note:  You might notice some irritation and congestion in your nose or some drainage.  This is from the oxygen  used during your procedure.  There is no need for concern and it should clear up in a day or so.  SYMPTOMS TO REPORT IMMEDIATELY:  Following lower endoscopy (colonoscopy or flexible sigmoidoscopy):  Excessive amounts of blood in the stool  Significant tenderness or worsening of abdominal pains  Swelling of the abdomen that is new, acute  Fever of 100F or higher.  For urgent or emergent issues, a gastroenterologist can be reached at any hour by calling [phone]. Do not use MyChart messaging for urgent concerns.    DIET:  We do  recommend a small meal at first, but then you may proceed to your regular diet.  Drink plenty of fluids but you should avoid alcoholic beverages for 24 hours.  ACTIVITY:  You should plan to take it easy for the rest of today and you should NOT DRIVE or use heavy machinery until tomorrow (because of the sedation medicines used during the test).    FOLLOW UP: Our staff will call the number listed on your records the next business day following your procedure.  We will call around 7:15- 8:00 am to check on you and address any questions or concerns that you may have regarding the information given to you following your procedure. If we do not reach you, we will leave a message.     If any biopsies were taken you will be contacted by phone or by letter within the next 1-3 weeks.  Please call us  at [phone] if you have not heard about the biopsies in 3 weeks.    SIGNATURES/CONFIDENTIALITY: You and/or your care partner have signed paperwork which will be entered into your electronic medical record.  These signatures attest to the fact that that the information above on your After Visit Summary has been reviewed and is understood.  Full responsibility of the confidentiality of this discharge information lies with you and/or your care-partner.

## 2024-05-02 NOTE — Progress Notes (Signed)
 Risks of Ozempic  explained to patient, and verbalized understanding. Given complaints of diarrhea, and no constipation or nausea and vomiting, pt verbalizes desire to proceed with colonoscopy.

## 2024-05-02 NOTE — Progress Notes (Signed)
 Called to room to assist during endoscopic procedure.  Patient ID and intended procedure confirmed with present staff. Received instructions for my participation in the procedure from the performing physician.

## 2024-05-02 NOTE — Progress Notes (Signed)
 Expand All Collapse All       Ellouise Console, PA-C [address] [place], [place]  [address] Phone: [phone]   Gastroenterology Consultation   Referring Provider:     Delbert Clam, MD Primary Care Physician:  Delbert Clam, MD Primary Gastroenterologist:  Ellouise Console, PA-C / Norleen Kiang, MD  Reason for Consultation:     Chronic Diarrhea        HPI:   [name] [name] is a 63 y.o. y/o female referred for consultation & management  by Newlin, Enobong, MD.     Current symptoms: She has had chronic diarrhea for 2 years.  She denies rectal bleeding or weight loss.  She has up to 3 loose stools a day.  She has episodes of nocturnal diarrhea with fecal incontinence.  She has to wear depends due to the incontinence.  She takes OTC Imodium  as needed which helps.  Imodium  causes constipation.  Stools can be loose, watery, and formed.  She has occasional abdominal cramping.  Takes dicyclomine  as needed.  Still has gallbladder.  No new medications.  No recent antibiotics or travel.  She avoids trigger foods such as ice cream, gravy, greens, and vegetables.   Fecal calprotectin and pancreatic elastase stool tests were ordered by PCP 01/24/2024, however patient had difficulty collecting sample.   09/2018 colonoscopy by Dr. Kiang: Normal.  No polyps or biopsies.  5-year repeat due to family history of mother With colon cancer under age 71.  5-year repeat colonoscopy was due 09/2023.   09/2018 EGD: Normal.  No biopsies.   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   PMH: COPD, CHF, CVD, sleep apnea, chronic hepatitis C, diabetes, hypertension, GERD, diabetic neuropathy, Graves' disease, obesity, anemia, Hx substance abuse.  09/2021 ECHO showed LVEF 60-65%.       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     Qualifier: Diagnosis of  By: Lelon RIGGERS, Scott     Hypertension     MRSA (methicillin resistant Staphylococcus aureus)     Trichomonas     VAGINITIS, BACTERIAL, RECURRENT 08/15/2007    Qualifier: Diagnosis of  By: Loretha MD, [place]                 Past Surgical History:  Procedure Laterality Date   BREAST BIOPSY Left 2018   BREAST BIOPSY Left 06/08/2022   BREAST BIOPSY Right 06/08/2022   BREAST BIOPSY Right 06/08/2022    US  RT BREAST BX W LOC DEV 1ST LESION IMG BX SPEC US  GUIDE 06/08/2022 [facility]   BREAST BIOPSY Left 06/08/2022    US  LT BREAST BX W LOC DEV 1ST LESION IMG BX SPEC US  GUIDE 06/08/2022 [facility]   BREAST BIOPSY   12/01/2022    MM LT RADIOACTIVE SEED LOC MAMMO GUIDE 12/01/2022 [facility]   BREAST LUMPECTOMY WITH RADIOACTIVE SEED LOCALIZATION Left 12/02/2022    Procedure: LEFT BREAST LUMPECTOMY WITH RADIOACTIVE SEED LOCALIZATION;  Surgeon: Vanderbilt Ned, MD;  Location: [facility];  Service: General;  Laterality: Left;   CESAREAN SECTION        breech   OPEN REDUCTION INTERNAL FIXATION (ORIF) METACARPAL Right 08/17/2023    Procedure: OPEN REDUCTION INTERNAL FIXATION (ORIF)  FIFTH METACARPAL;  Surgeon: Romona Harari, MD;  Location: [facility]  ;  Service: Orthopedics;  Laterality: Right;  regional                 Prior to Admission medications   Medication Sig Start Date End Date Taking? Authorizing Provider  albuterol  (VENTOLIN  HFA) 108 (90 Base) MCG/ACT inhaler Inhale 2 puffs into the lungs every 4 (four) hours as needed for wheezing or shortness of breath. 12/01/22     Newlin, Enobong, MD  amLODipine  (NORVASC ) 2.5 MG tablet Take 1 Tablet by mouth once daily (morning) 10/26/23     Milford, Harlene HERO, FNP  atorvastatin  (LIPITOR) 40 MG tablet take 1 Tablet by mouth in the morning (morning) 10/26/23     Milford, Harlene HERO, FNP  benzonatate  (TESSALON ) 100 MG capsule Take 1 capsule (100 mg total) by mouth every 8 (eight) hours. 06/04/23      Geiple, Joshua, PA-C  carboxymethylcellul-glycerin  (LUBRICATING EYE DROPS) 0.5-0.9 % ophthalmic solution Place 1 drop into both eyes daily as needed (dry eyes). 12/01/22     Newlin, Enobong, MD  carvedilol  (COREG ) 25 MG tablet take 1 Tablet by mouth twice daily (morning, evening) 10/26/23     Milford, Harlene HERO, FNP  dicyclomine  (BENTYL ) 10 MG capsule Take 1 capsule (10 mg total) by mouth 3 (three) times daily before meals. 04/05/23     Newlin, Enobong, MD  famotidine  (PEPCID ) 40 MG tablet TAKE 1 Tablet BY MOUTH ONCE DAILY (evening) 02/29/24     Newlin, Enobong, MD  hydrALAZINE  (APRESOLINE ) 25 MG tablet TAKE 1 Tablet BY MOUTH THREE TIMES DAILY (morning, noon, evening) 10/26/23     Milford, Harlene HERO, FNP  hydrALAZINE  (APRESOLINE ) 50 MG tablet TAKE 1 Tablet BY MOUTH THREE TIMES DAILY take along with 25mg  DOSE (morning, noon, evening) 11/25/23     Milford, Harlene HERO, FNP  JARDIANCE  10 MG TABS tablet Take 1 Tablet by mouth once daily (morning) 10/26/23     Milford, Harlene HERO, FNP  oxybutynin  (DITROPAN ) 5 MG tablet TAKE 1 Tablet BY MOUTH TWICE DAILY (MORNING, evening) 01/24/24     Newlin, Enobong, MD  pregabalin  (LYRICA ) 75 MG capsule Take 1 capsule (75 mg total) by mouth 2 (two) times daily. 01/24/24     Newlin, Enobong, MD  promethazine -dextromethorphan  (PROMETHAZINE -DM) 6.25-15 MG/5ML syrup Take 5 mLs by mouth 4 (four) times daily as needed for cough. 06/04/23     Desiderio Chew, PA-C  Semaglutide , 1 MG/DOSE, (OZEMPIC , 1 MG/DOSE,) 4 MG/3ML SOPN Inject 1 mg into the skin once a week. 07/05/23     Newlin, Enobong, MD  spironolactone  (ALDACTONE ) 25 MG tablet Take 0.5 tablets (12.5 mg total) by mouth daily. PLEASE SCHEDULE APPOINTMENT FOR MORE REFILLS 02/29/24     Glena Harlene HERO, FNP  torsemide  (DEMADEX ) 20 MG tablet Take 1 Tablet by mouth once daily (morning) 11/25/23     Milford, Harlene HERO, FNP           Family History  Problem Relation Age of Onset   Colon cancer Mother     Liver disease Sister     Other  Neg Hx     Breast cancer Neg Hx     Esophageal cancer Neg Hx     Rectal cancer Neg Hx     Tremor Neg Hx            Social History  Social History         Tobacco Use   Smoking status: Never   Smokeless tobacco: Never  Vaping Use   Vaping  status: Never Used  Substance Use Topics   Alcohol use: No   Drug use: Not Currently      Types: Cocaine, Marijuana      Comment: remote h/o cocaine 2015 and marijuana 2018             Allergies as of 03/28/2024 - Review Complete 03/28/2024  Allergen Reaction Noted   Lisinopril  Swelling 04/20/2019      Review of Systems:    All systems reviewed and negative except where noted in HPI.    Physical Exam:  BP 104/62   Pulse 76   Ht 5' 4 (1.626 m)   Wt 214 lb 12.8 oz (97.4 kg)   LMP 04/01/2010   BMI 36.87 kg/m  Patient's last menstrual period was 04/01/2010.   General:   Alert,  Well-developed, well-nourished, obese, pleasant and cooperative in NAD Lungs:  Respirations even and unlabored.  Clear throughout to auscultation.   No wheezes, crackles, or rhonchi. No acute distress. Heart:  Regular rate and rhythm; no murmurs, clicks, rubs, or gallops. Abdomen:  Normal bowel sounds.  No bruits.  Soft, and obese without masses, hepatosplenomegaly or hernias noted.  No Tenderness.  No guarding or rebound tenderness.    Neurologic:  Alert and oriented x3;  grossly normal neurologically. Psych:  Alert and cooperative. Normal mood and affect.   Imaging Studies: Imaging Results  No results found.     Labs: CBC Labs (Brief)          Component Value Date/Time    WBC 3.7 (L) 01/20/2023 0758    RBC 3.53 (L) 01/20/2023 0758    HGB 10.3 (L) 01/20/2023 0758    HGB 9.8 (L) 07/10/2019 1544    HCT 32.7 (L) 01/20/2023 0758    HCT 30.9 (L) 07/10/2019 1544    PLT 127 (L) 01/20/2023 0758    PLT 171 07/10/2019 1544    MCV 92.6 01/20/2023 0758    MCV 86 07/10/2019 1544        CMP     Labs (Brief)          Component Value Date/Time     NA 143 08/16/2023 1100    NA 143 07/05/2023 1200    K 3.3 (L) 08/16/2023 1100    CL 107 08/16/2023 1100    CO2 26 08/16/2023 1100    GLUCOSE 127 (H) 08/16/2023 1100    BUN 21 08/16/2023 1100    BUN 23 07/05/2023 1200    CREATININE 2.06 (H) 08/16/2023 1100    CREATININE 0.88 12/12/2012 1349    CALCIUM  8.9 08/16/2023 1100    PROT 7.0 08/16/2023 1100    PROT 7.2 07/05/2023 1200    ALBUMIN 3.7 08/16/2023 1100    ALBUMIN 4.3 07/05/2023 1200    AST 12 (L) 08/16/2023 1100    ALT 9 08/16/2023 1100    ALT 15 11/18/2016 1133    ALKPHOS 73 08/16/2023 1100    BILITOT 1.0 08/16/2023 1100    BILITOT 0.5 07/05/2023 1200    GFRNONAA 27 (L) 08/16/2023 1100    GFRAA 38 (L) 11/08/2019 0435        Assessment and Plan:    [name] [name] is a 63 y.o. y/o female has been referred for:   1.  Chronic diarrhea: DDx: Infection, Overflow diarrhea from constipation, IBS, Microscopic Colitis, Celiac, IBD. - I collected stool swab specimen to check [facility] GI panal with C. Diff today. - Labs: CBC, CMP, CRP, Celiac panal - Abd Xray: evaluate  stool burden   2.  IBS / Fecal Incontinence - Continue dicyclomine  as needed - If stool test negative for infection, then OK to continue Imodium  prn. - May need to add fiber supplement.   3.  Family history of colon cancer mother under age 34 - Scheduling Colonoscopy (in 4 weeks if [facility] is Negative for Infections). I discussed risks of colonoscopy with patient to include risk of bleeding, colon perforation, and risk of sedation.  Patient expressed understanding and agrees to proceed with colonoscopy.    4.  Personal history of tubulovillous adenoma (01/2017) - Proceed with repeat colonoscopy if [facility] is negative for infections.     Follow up 4 weeks after colonoscopy with TG.   Ellouise Console, PA-C      Recent H&P as above.  No interval change.  Patient has been off Ozempic  for 3 to 4 days.  Guidelines reviewed.  Wishes to proceed with  colonoscopy

## 2024-05-02 NOTE — Progress Notes (Signed)
 Pt's states no medical or surgical changes since previsit or office visit.

## 2024-05-02 NOTE — Progress Notes (Signed)
 During admission process patient stated that her last dose of Ozempic  was 3 days ago.  Gretta, CRNA notified and had already spoken with the patient.

## 2024-05-03 ENCOUNTER — Telehealth: Payer: Self-pay

## 2024-05-03 NOTE — Telephone Encounter (Signed)
 Left message on follow up call.

## 2024-05-04 ENCOUNTER — Ambulatory Visit: Payer: Self-pay | Admitting: Internal Medicine

## 2024-05-04 LAB — SURGICAL PATHOLOGY

## 2024-05-14 ENCOUNTER — Other Ambulatory Visit (HOSPITAL_COMMUNITY): Payer: Self-pay | Admitting: Family Medicine

## 2024-05-14 ENCOUNTER — Other Ambulatory Visit: Payer: Self-pay | Admitting: Family Medicine

## 2024-05-14 DIAGNOSIS — Z7985 Long-term (current) use of injectable non-insulin antidiabetic drugs: Secondary | ICD-10-CM

## 2024-05-14 DIAGNOSIS — E1149 Type 2 diabetes mellitus with other diabetic neurological complication: Secondary | ICD-10-CM

## 2024-05-16 ENCOUNTER — Other Ambulatory Visit (HOSPITAL_COMMUNITY): Payer: Self-pay | Admitting: Family Medicine

## 2024-05-16 DIAGNOSIS — I5032 Chronic diastolic (congestive) heart failure: Secondary | ICD-10-CM

## 2024-05-16 DIAGNOSIS — E1149 Type 2 diabetes mellitus with other diabetic neurological complication: Secondary | ICD-10-CM

## 2024-05-16 DIAGNOSIS — E1122 Type 2 diabetes mellitus with diabetic chronic kidney disease: Secondary | ICD-10-CM

## 2024-07-04 ENCOUNTER — Other Ambulatory Visit (HOSPITAL_COMMUNITY): Payer: Self-pay | Admitting: Family Medicine

## 2024-07-19 ENCOUNTER — Encounter (HOSPITAL_COMMUNITY): Payer: Self-pay

## 2024-07-25 ENCOUNTER — Telehealth: Payer: Self-pay | Admitting: Family Medicine

## 2024-07-25 NOTE — Telephone Encounter (Signed)
 Pt unconfirmed appt (no vm )

## 2024-07-26 ENCOUNTER — Ambulatory Visit: Admitting: Family Medicine

## 2024-08-02 ENCOUNTER — Other Ambulatory Visit (HOSPITAL_COMMUNITY): Payer: Self-pay | Admitting: Family Medicine

## 2024-08-02 ENCOUNTER — Other Ambulatory Visit: Payer: Self-pay | Admitting: Family Medicine

## 2024-08-02 DIAGNOSIS — E1149 Type 2 diabetes mellitus with other diabetic neurological complication: Secondary | ICD-10-CM

## 2024-08-08 ENCOUNTER — Telehealth: Payer: Self-pay | Admitting: Family Medicine

## 2024-08-08 NOTE — Telephone Encounter (Signed)
 08/08/24 Left VM regarding appointment and address

## 2024-08-09 ENCOUNTER — Encounter: Payer: Self-pay | Admitting: Family Medicine

## 2024-08-09 ENCOUNTER — Ambulatory Visit: Payer: Self-pay | Admitting: Family Medicine

## 2024-08-09 VITALS — BP 130/75 | HR 83 | Temp 98.2°F | Resp 20 | Ht 64.5 in | Wt 215.0 lb

## 2024-08-09 DIAGNOSIS — Z1231 Encounter for screening mammogram for malignant neoplasm of breast: Secondary | ICD-10-CM

## 2024-08-09 DIAGNOSIS — E1169 Type 2 diabetes mellitus with other specified complication: Secondary | ICD-10-CM

## 2024-08-09 DIAGNOSIS — Z23 Encounter for immunization: Secondary | ICD-10-CM

## 2024-08-09 DIAGNOSIS — Z794 Long term (current) use of insulin: Secondary | ICD-10-CM

## 2024-08-09 DIAGNOSIS — E0500 Thyrotoxicosis with diffuse goiter without thyrotoxic crisis or storm: Secondary | ICD-10-CM

## 2024-08-09 DIAGNOSIS — E785 Hyperlipidemia, unspecified: Secondary | ICD-10-CM

## 2024-08-09 DIAGNOSIS — N184 Chronic kidney disease, stage 4 (severe): Secondary | ICD-10-CM

## 2024-08-09 DIAGNOSIS — I130 Hypertensive heart and chronic kidney disease with heart failure and stage 1 through stage 4 chronic kidney disease, or unspecified chronic kidney disease: Secondary | ICD-10-CM

## 2024-08-09 DIAGNOSIS — E1122 Type 2 diabetes mellitus with diabetic chronic kidney disease: Secondary | ICD-10-CM

## 2024-08-09 DIAGNOSIS — J441 Chronic obstructive pulmonary disease with (acute) exacerbation: Secondary | ICD-10-CM

## 2024-08-09 DIAGNOSIS — B370 Candidal stomatitis: Secondary | ICD-10-CM

## 2024-08-09 DIAGNOSIS — N941 Unspecified dyspareunia: Secondary | ICD-10-CM

## 2024-08-09 DIAGNOSIS — I129 Hypertensive chronic kidney disease with stage 1 through stage 4 chronic kidney disease, or unspecified chronic kidney disease: Secondary | ICD-10-CM

## 2024-08-09 DIAGNOSIS — K529 Noninfective gastroenteritis and colitis, unspecified: Secondary | ICD-10-CM

## 2024-08-09 DIAGNOSIS — E11311 Type 2 diabetes mellitus with unspecified diabetic retinopathy with macular edema: Secondary | ICD-10-CM

## 2024-08-09 MED ORDER — NYSTATIN 100000 UNIT/ML MT SUSP: 5.0000 mL | Freq: Four times a day (QID) | OROMUCOSAL | 0 refills | Status: AC

## 2024-08-09 MED ORDER — AMOXICILLIN-POT CLAVULANATE 875-125 MG PO TABS: 1.0000 | ORAL_TABLET | Freq: Two times a day (BID) | ORAL | 0 refills | Status: AC

## 2024-08-09 MED ORDER — ALBUTEROL SULFATE HFA 108 (90 BASE) MCG/ACT IN AERS: 2.0000 | INHALATION_SPRAY | RESPIRATORY_TRACT | 0 refills | Status: AC | PRN

## 2024-08-09 MED ORDER — PREDNISONE 20 MG PO TABS: 20.0000 mg | ORAL_TABLET | Freq: Every day | ORAL | 0 refills | Status: AC

## 2024-08-09 MED ORDER — BUDESONIDE-FORMOTEROL FUMARATE 160-4.5 MCG/ACT IN AERO: 2.0000 | INHALATION_SPRAY | Freq: Two times a day (BID) | RESPIRATORY_TRACT | 3 refills | Status: AC

## 2024-08-09 MED ORDER — ESTRADIOL 0.01 % VA CREA: TOPICAL_CREAM | VAGINAL | 12 refills | Status: AC

## 2024-08-09 NOTE — Patient Instructions (Addendum)
 Please call the cardiology clinic to schedule your appointment as you are overdue for cardiology visit.  [facility] [address] [place],  [place]  [address] Main: [phone]

## 2024-08-09 NOTE — Progress Notes (Signed)
 "  Subjective:  Patient ID: [name] [name], female    DOB: [birth year]  Age: 64 y.o. MRN: [id number]  CC: URI and Dyspareunia     Discussed the use of AI scribe software for clinical note transcription with the patient, who gave verbal consent to proceed.  History of Present Illness [name] [name] is a 64 year old female with a history of  type 2 diabetes mellitus , diabetic neuropathy, hypertension, diabetic retinopathy, hyperlipidemia, hepatitis C (completed treatment with harvoni), OSA, diastolic CHF (EF 60 to 65% from echo of 09/2021), stage III CKD, hyperthyroidism and chronic diarrhea who presents with upper respiratory symptoms.  She has had 1 week of hoarseness, mucus production, and chest congestion with a painful cough producing brown sputum, plus wheezing and shortness of breath. She is out of her albuterol  inhaler.She does not smoke.Her chart reveals a history of COPD however she does not have a formal PFT to confirm that diagnosis.  She has pain with intercourse described as feeling like needles, which persists despite using Vaseline as a lubricant.  She has not followed with endocrinology for Graves' disease, cardiology for heart failure, or nephrology for stage 4 kidney disease for an extended period.   Her chronic diarrhea was evaluated by GI and colonoscopy performed which was normal with recommendation to repeat in 5 years.  Celiac panel labs within normal.  Recommendation was to continue with dicyclomine .    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   Qualifier: Diagnosis of  By: Lelon RIGGERS, Scott     Hypertension    MRSA (methicillin resistant Staphylococcus aureus)    Trichomonas    VAGINITIS, BACTERIAL, RECURRENT 08/15/2007   Qualifier: Diagnosis of  By: Loretha MD, Victoria      Past Surgical  History:  Procedure Laterality Date   BREAST BIOPSY Left 2018   BREAST BIOPSY Left 06/08/2022   BREAST BIOPSY Right 06/08/2022   BREAST BIOPSY Right 06/08/2022   US  RT BREAST BX W LOC DEV 1ST LESION IMG BX SPEC US  GUIDE 06/08/2022 [facility]   BREAST BIOPSY Left 06/08/2022   US  LT BREAST BX W LOC DEV 1ST LESION IMG BX SPEC US  GUIDE 06/08/2022 [facility]   BREAST BIOPSY  12/01/2022   MM LT RADIOACTIVE SEED LOC MAMMO GUIDE 12/01/2022 [facility]   BREAST LUMPECTOMY WITH RADIOACTIVE SEED LOCALIZATION Left 12/02/2022   Procedure: LEFT BREAST LUMPECTOMY WITH RADIOACTIVE SEED LOCALIZATION;  Surgeon: Vanderbilt Ned, MD;  Location: [facility];  Service: General;  Laterality: Left;   CESAREAN SECTION     breech   OPEN REDUCTION INTERNAL FIXATION (ORIF) METACARPAL Right 08/17/2023   Procedure: OPEN REDUCTION INTERNAL FIXATION (ORIF)  FIFTH METACARPAL;  Surgeon: Romona Harari, MD;  Location: [facility];  Service: Orthopedics;  Laterality: Right;  regional    Family History  Problem Relation Age of Onset   Colon cancer Mother    Liver disease Sister    Other Neg Hx    Breast cancer Neg Hx    Esophageal cancer Neg Hx    Rectal cancer Neg Hx    Tremor Neg Hx     Social History   Socioeconomic History   Marital status: Legally Separated    Spouse name: Not on file   Number of children: 1  Years of education: Not on file   Highest education level: 9th grade  Occupational History   Occupation: disability  Tobacco Use   Smoking status: Never   Smokeless tobacco: Never  Vaping Use   Vaping status: Never Used  Substance and Sexual Activity   Alcohol use: No   Drug use: Not Currently    Types: Cocaine, Marijuana    Comment: remote h/o cocaine 2015 and marijuana 2018   Sexual activity: Yes  Other Topics Concern   Not on file  Social History Narrative   Lives with friend, Hildegarde and nephew.  One daughter.    Social Drivers of Health   Tobacco Use:  Low Risk (08/09/2024)   Patient History    Smoking Tobacco Use: Never    Smokeless Tobacco Use: Never    Passive Exposure: Not on file  Financial Resource Strain: Low Risk (07/22/2022)   Overall Financial Resource Strain (CARDIA)    Difficulty of Paying Living Expenses: Not very hard  Food Insecurity: No Food Insecurity (02/23/2023)   Hunger Vital Sign    Worried About Running Out of Food in the Last Year: Never true    Ran Out of Food in the Last Year: Never true  Transportation Needs: No Transportation Needs (02/23/2023)   PRAPARE - Administrator, Civil Service (Medical): No    Lack of Transportation (Non-Medical): No  Physical Activity: Inactive (02/23/2023)   Exercise Vital Sign    Days of Exercise per Week: 0 days    Minutes of Exercise per Session: 0 min  Stress: No Stress Concern Present (02/23/2023)   [employer] of Occupational Health - Occupational Stress Questionnaire    Feeling of Stress : Not at all  Social Connections: Moderately Isolated (02/23/2023)   Social Connection and Isolation Panel    Frequency of Communication with Friends and Family: More than three times a week    Frequency of Social Gatherings with Friends and Family: Twice a week    Attends Religious Services: More than 4 times per year    Active Member of Clubs or Organizations: No    Attends Banker Meetings: Never    Marital Status: Divorced  Depression (PHQ2-9): Low Risk (01/24/2024)   Depression (PHQ2-9)    PHQ-2 Score: 0  Alcohol Screen: Low Risk (02/23/2023)   Alcohol Screen    Last Alcohol Screening Score (AUDIT): 0  Housing: Low Risk (02/23/2023)   Housing    Last Housing Risk Score: 0  Utilities: Not At Risk (02/23/2023)   AHC Utilities    Threatened with loss of utilities: No  Health Literacy: Inadequate Health Literacy (02/23/2023)   B1300 Health Literacy    Frequency of need for help with medical instructions: Sometimes    Allergies[1]  Outpatient Medications Prior to  Visit  Medication Sig Dispense Refill   amLODipine  (NORVASC ) 2.5 MG tablet Take 1 Tablet by mouth once daily (morning) 90 tablet 1   atorvastatin  (LIPITOR) 40 MG tablet take 1 Tablet by mouth in the morning (morning) 90 tablet 1   carvedilol  (COREG ) 25 MG tablet take 1 Tablet by mouth twice daily (morning, evening) 180 tablet 3   famotidine  (PEPCID ) 40 MG tablet TAKE 1 Tablet BY MOUTH ONCE DAILY (evening) 30 tablet 2   hydrALAZINE  (APRESOLINE ) 25 MG tablet TAKE 1 Tablet BY MOUTH THREE TIMES DAILY (morning, noon, evening) 270 tablet 3   hydrALAZINE  (APRESOLINE ) 50 MG tablet TAKE 1 Tablet BY MOUTH THREE TIMES DAILY take along with 25mg   DOSE (morning, noon, evening) 135 tablet 8   JARDIANCE  10 MG TABS tablet Take 1 Tablet by mouth once daily (morning) 90 tablet 1   pregabalin  (LYRICA ) 75 MG capsule TAKE 1 Capsule BY MOUTH TWICE DAILY (MORNING, evening) 180 capsule 1   Semaglutide , 1 MG/DOSE, (OZEMPIC , 1 MG/DOSE,) 4 MG/3ML SOPN INJECT 1 MG into THE SKIN ONCE a WEEK 3 mL 2   spironolactone  (ALDACTONE ) 25 MG tablet Take 0.5 tablets (12.5 mg total) by mouth daily. PLEASE SCHEDULE APPOINTMENT FOR MORE REFILLS [phone] OPTION 2 15 tablet 0   torsemide  (DEMADEX ) 20 MG tablet Take 1 Tablet by mouth once daily (morning) 90 tablet 8   albuterol  (VENTOLIN  HFA) 108 (90 Base) MCG/ACT inhaler Inhale 2 puffs into the lungs every 4 (four) hours as needed for wheezing or shortness of breath. 1 each 0   promethazine -dextromethorphan  (PROMETHAZINE -DM) 6.25-15 MG/5ML syrup Take 5 mLs by mouth 4 (four) times daily as needed for cough. 118 mL 0   No facility-administered medications prior to visit.     ROS Review of Systems  Constitutional:  Negative for activity change and appetite change.  HENT:  Positive for sore throat and voice change. Negative for sinus pressure.   Respiratory:  Positive for shortness of breath and wheezing. Negative for chest tightness.   Cardiovascular:  Negative for chest pain and  palpitations.  Gastrointestinal:  Negative for abdominal distention, abdominal pain and constipation.  Genitourinary:  Positive for dyspareunia.  Musculoskeletal: Negative.   Psychiatric/Behavioral:  Negative for behavioral problems and dysphoric mood.     Objective:  BP 130/75   Pulse 83   Temp 98.2 F (36.8 C) (Oral)   Resp 20   Ht 5' 4.5 (1.638 m)   Wt 215 lb (97.5 kg)   LMP 04/01/2010   SpO2 92%   BMI 36.33 kg/m      08/09/2024    1:26 PM 05/02/2024    9:49 AM 05/02/2024    9:39 AM  BP/Weight  Systolic BP 130 151 121  Diastolic BP 75 76 70  Wt. (Lbs) 215    BMI 36.33 kg/m2        Physical Exam Constitutional:      Appearance: She is well-developed.  HENT:     Mouth/Throat:     Comments: White patches in oropharynx Cardiovascular:     Rate and Rhythm: Normal rate.     Heart sounds: Murmur heard.  Pulmonary:     Effort: Pulmonary effort is normal.     Breath sounds: Normal breath sounds. No wheezing or rales.  Chest:     Chest wall: No tenderness.  Abdominal:     General: Bowel sounds are normal. There is no distension.     Palpations: Abdomen is soft. There is no mass.     Tenderness: There is no abdominal tenderness.  Musculoskeletal:        General: Normal range of motion.     Right lower leg: No edema.     Left lower leg: No edema.  Neurological:     Mental Status: She is alert and oriented to person, place, and time.  Psychiatric:        Mood and Affect: Mood normal.        Latest Ref Rng & Units 03/28/2024   11:03 AM 08/16/2023   11:00 AM 07/05/2023   12:00 PM  CMP  Glucose 70 - 99 mg/dL 878  872  892   BUN 6 - 23 mg/dL 44  21  23   Creatinine 0.40 - 1.20 mg/dL 7.57  7.93  8.36   Sodium 135 - 145 mEq/L 142  143  143   Potassium 3.5 - 5.1 mEq/L 3.9  3.3  3.8   Chloride 96 - 112 mEq/L 108  107  108   CO2 19 - 32 mEq/L 23  26  18    Calcium  8.4 - 10.5 mg/dL 8.6  8.9  9.0   Total Protein 6.0 - 8.3 g/dL 7.1  7.0  7.2   Total Bilirubin 0.2  - 1.2 mg/dL 0.6  1.0  0.5   Alkaline Phos 39 - 117 U/L 71  73  99   AST 0 - 37 U/L 9  12  13    ALT 0 - 35 U/L 8  9  8      Lipid Panel     Component Value Date/Time   CHOL 148 09/06/2022 1135   TRIG 77 09/06/2022 1135   HDL 78 09/06/2022 1135   CHOLHDL 2.5 01/29/2019 1118   CHOLHDL 3 06/18/2014 0951   VLDL 19.6 06/18/2014 0951   LDLCALC 55 09/06/2022 1135    CBC    Component Value Date/Time   WBC 3.3 (L) 03/28/2024 1103   RBC 3.54 (L) 03/28/2024 1103   HGB 10.2 (L) 03/28/2024 1103   HGB 9.8 (L) 07/10/2019 1544   HCT 31.3 (L) 03/28/2024 1103   HCT 30.9 (L) 07/10/2019 1544   PLT 134.0 (L) 03/28/2024 1103   PLT 171 07/10/2019 1544   MCV 88.2 03/28/2024 1103   MCV 86 07/10/2019 1544   MCH 29.2 01/20/2023 0758   MCHC 32.6 03/28/2024 1103   RDW 15.5 03/28/2024 1103   RDW 12.7 07/10/2019 1544   LYMPHSABS 1.1 03/28/2024 1103   LYMPHSABS 1.3 07/10/2019 1544   MONOABS 0.3 03/28/2024 1103   EOSABS 0.0 03/28/2024 1103   EOSABS 0.0 07/10/2019 1544   BASOSABS 0.0 03/28/2024 1103   BASOSABS 0.0 07/10/2019 1544    Lab Results  Component Value Date   HGBA1C 6.4 01/24/2024       Assessment & Plan Chronic obstructive pulmonary disease with acute exacerbation Acute exacerbation with wheezing, shortness of breath, and low oxygen  saturation of 92%. - Prescribed albuterol  inhaler for immediate relief. - Prescribed Symbicort  inhaler for twice daily use to prevent exacerbations. - Prescribed antibiotics for potential infection. - Prescribed prednisone  20 mg for 5 days -Consider PFT at next visit to confirm or rule out diagnosis  Type 2 diabetes mellitus with chronic kidney disease and hyperlipidemia Previous A1c was 6.4, indicating good control. - Ordered blood test to check A1c and kidney function. -Continue Jardiance  and Ozempic  -Counseled on Diabetic diet, the healthy plate, 849 minutes of moderate intensity exercise/week Blood sugar logs with fasting goals of 80-120 mg/dl,  random of less than 819 and in the event of sugars less than 60 mg/dl or greater than 599 mg/dl encouraged to notify the clinic. Advised on the need for annual eye exams, annual foot exams, Pneumonia vaccine.   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 Blood pressure is well-controlled. Continue SGLT2i, BB, Spironolactone , Torsemide  - Referred to cardiologist for heart failure management. - Ordered blood test to check kidney function. -Advised to schedule Nephrology appointment   Graves disease No recent follow-up with endocrinologist. Not on medications  - Ordered blood test to check thyroid  function.  Oropharyngeal candidiasis Presence of white patches  in the throat suggestive of thrush. - Prescribed nystatin  for oral thrush.  Dyspareunia Painful intercourse with sensation of dryness and needles. - Prescribed Estrace  cream for application   Chronic Diarrhea -Continue antimotility agent -Continue to follow up with GI   Healthcare maintenance Encounter for vaccine administration - Flu shot administered  Meds ordered this encounter  Medications   estradiol  (ESTRACE ) 0.01 % CREA vaginal cream    Sig: Apply a pea-sized amount to vagina daily for 1 week then twice weekly after that.    Dispense:  42.5 g    Refill:  12   nystatin  (MYCOSTATIN ) 100000 UNIT/ML suspension    Sig: Take 5 mLs (500,000 Units total) by mouth 4 (four) times daily. Swish and swallow    Dispense:  60 mL    Refill:  0   predniSONE  (DELTASONE ) 20 MG tablet    Sig: Take 1 tablet (20 mg total) by mouth daily with breakfast.    Dispense:  5 tablet    Refill:  0   amoxicillin -clavulanate (AUGMENTIN ) 875-125 MG tablet    Sig: Take 1 tablet by mouth 2 (two) times daily.    Dispense:  20 tablet    Refill:  0   budesonide -formoterol  (SYMBICORT ) 160-4.5 MCG/ACT inhaler    Sig: Inhale 2  puffs into the lungs 2 (two) times daily.    Dispense:  1 each    Refill:  3   albuterol  (VENTOLIN  HFA) 108 (90 Base) MCG/ACT inhaler    Sig: Inhale 2 puffs into the lungs every 4 (four) hours as needed for wheezing or shortness of breath.    Dispense:  1 each    Refill:  0    Follow-up: Return in about 6 months (around 02/06/2025) for Chronic medical conditions.       Corrina Sabin, MD, FAAFP. [facility] [place], [place] [phone]   08/09/2024, 2:04 PM     [1]  Allergies Allergen Reactions   Lisinopril  Swelling   "

## 2024-08-09 NOTE — Progress Notes (Signed)
 Chest congestion and hoarseness x 1  Dyspareunia on an encounter with her new partner

## 2024-08-10 ENCOUNTER — Ambulatory Visit: Payer: Self-pay | Admitting: Family Medicine

## 2024-08-10 DIAGNOSIS — E0500 Thyrotoxicosis with diffuse goiter without thyrotoxic crisis or storm: Secondary | ICD-10-CM

## 2024-08-10 LAB — CMP14+EGFR
ALT: 8 IU/L (ref 0–32)
AST: 8 IU/L (ref 0–40)
Albumin: 4.4 g/dL (ref 3.9–4.9)
Alkaline Phosphatase: 95 IU/L (ref 49–135)
BUN/Creatinine Ratio: 13 (ref 12–28)
BUN: 32 mg/dL — ABNORMAL HIGH (ref 8–27)
Bilirubin Total: 0.5 mg/dL (ref 0.0–1.2)
CO2: 20 mmol/L (ref 20–29)
Calcium: 9.1 mg/dL (ref 8.7–10.3)
Chloride: 105 mmol/L (ref 96–106)
Creatinine, Ser: 2.56 mg/dL — ABNORMAL HIGH (ref 0.57–1.00)
Globulin, Total: 2.9 g/dL (ref 1.5–4.5)
Glucose: 97 mg/dL (ref 70–99)
Potassium: 4 mmol/L (ref 3.5–5.2)
Sodium: 142 mmol/L (ref 134–144)
Total Protein: 7.3 g/dL (ref 6.0–8.5)
eGFR: 20 mL/min/1.73 — ABNORMAL LOW

## 2024-08-10 LAB — T3: T3, Total: 116 ng/dL (ref 71–180)

## 2024-08-10 LAB — TSH: TSH: 0.097 u[IU]/mL — ABNORMAL LOW (ref 0.450–4.500)

## 2024-08-10 LAB — T4, FREE: Free T4: 1.36 ng/dL (ref 0.82–1.77)

## 2024-08-10 LAB — HEMOGLOBIN A1C
Est. average glucose Bld gHb Est-mCnc: 148 mg/dL
Hgb A1c MFr Bld: 6.8 % — ABNORMAL HIGH (ref 4.8–5.6)

## 2024-08-11 LAB — MICROALBUMIN / CREATININE URINE RATIO
Creatinine, Urine: 72.9 mg/dL
Microalb/Creat Ratio: 163 mg/g{creat} — ABNORMAL HIGH (ref 0–29)
Microalbumin, Urine: 118.6 ug/mL
